# Patient Record
Sex: Female | Born: 1950 | Race: Black or African American | Hispanic: No | Smoking: Former smoker
Health system: Southern US, Community
[De-identification: ages and names within clinical notes are randomized; demographics above are authoritative.]

## PROBLEM LIST (undated history)

## (undated) DIAGNOSIS — E785 Hyperlipidemia, unspecified: Secondary | ICD-10-CM

## (undated) DIAGNOSIS — D649 Anemia, unspecified: Secondary | ICD-10-CM

## (undated) DIAGNOSIS — Z9289 Personal history of other medical treatment: Secondary | ICD-10-CM

## (undated) DIAGNOSIS — I10 Essential (primary) hypertension: Secondary | ICD-10-CM

## (undated) DIAGNOSIS — R319 Hematuria, unspecified: Secondary | ICD-10-CM

## (undated) DIAGNOSIS — N739 Female pelvic inflammatory disease, unspecified: Secondary | ICD-10-CM

## (undated) DIAGNOSIS — J4489 Other specified chronic obstructive pulmonary disease: Secondary | ICD-10-CM

## (undated) DIAGNOSIS — N736 Female pelvic peritoneal adhesions (postinfective): Secondary | ICD-10-CM

## (undated) DIAGNOSIS — M810 Age-related osteoporosis without current pathological fracture: Secondary | ICD-10-CM

## (undated) DIAGNOSIS — K219 Gastro-esophageal reflux disease without esophagitis: Secondary | ICD-10-CM

## (undated) DIAGNOSIS — R0602 Shortness of breath: Secondary | ICD-10-CM

## (undated) DIAGNOSIS — J449 Chronic obstructive pulmonary disease, unspecified: Secondary | ICD-10-CM

## (undated) DIAGNOSIS — J189 Pneumonia, unspecified organism: Secondary | ICD-10-CM

## (undated) DIAGNOSIS — K5090 Crohn's disease, unspecified, without complications: Secondary | ICD-10-CM

## (undated) DIAGNOSIS — F419 Anxiety disorder, unspecified: Secondary | ICD-10-CM

## (undated) DIAGNOSIS — M069 Rheumatoid arthritis, unspecified: Secondary | ICD-10-CM

## (undated) DIAGNOSIS — F329 Major depressive disorder, single episode, unspecified: Secondary | ICD-10-CM

## (undated) DIAGNOSIS — K565 Intestinal adhesions [bands], unspecified as to partial versus complete obstruction: Secondary | ICD-10-CM

## (undated) DIAGNOSIS — J439 Emphysema, unspecified: Secondary | ICD-10-CM

## (undated) DIAGNOSIS — D069 Carcinoma in situ of cervix, unspecified: Secondary | ICD-10-CM

## (undated) DIAGNOSIS — E119 Type 2 diabetes mellitus without complications: Secondary | ICD-10-CM

## (undated) DIAGNOSIS — Z9981 Dependence on supplemental oxygen: Secondary | ICD-10-CM

## (undated) DIAGNOSIS — J383 Other diseases of vocal cords: Secondary | ICD-10-CM

## (undated) DIAGNOSIS — F32A Depression, unspecified: Secondary | ICD-10-CM

## (undated) HISTORY — DX: Major depressive disorder, single episode, unspecified: F32.9

## (undated) HISTORY — DX: Crohn's disease, unspecified, without complications: K50.90

## (undated) HISTORY — DX: Carcinoma in situ of cervix, unspecified: D06.9

## (undated) HISTORY — DX: Female pelvic inflammatory disease, unspecified: N73.9

## (undated) HISTORY — DX: Gastro-esophageal reflux disease without esophagitis: K21.9

## (undated) HISTORY — PX: OOPHORECTOMY: SHX86

## (undated) HISTORY — DX: Anxiety disorder, unspecified: F41.9

## (undated) HISTORY — DX: Other specified chronic obstructive pulmonary disease: J44.89

## (undated) HISTORY — DX: Age-related osteoporosis without current pathological fracture: M81.0

## (undated) HISTORY — DX: Depression, unspecified: F32.A

## (undated) HISTORY — PX: ESOPHAGOGASTRODUODENOSCOPY (EGD) WITH ESOPHAGEAL DILATION: SHX5812

## (undated) HISTORY — PX: COLPOSCOPY: SHX161

## (undated) HISTORY — DX: Rheumatoid arthritis, unspecified: M06.9

## (undated) HISTORY — DX: Chronic obstructive pulmonary disease, unspecified: J44.9

## (undated) HISTORY — DX: Female pelvic peritoneal adhesions (postinfective): N73.6

## (undated) HISTORY — PX: HERNIA REPAIR: SHX51

## (undated) HISTORY — DX: Other diseases of vocal cords: J38.3

## (undated) HISTORY — DX: Hematuria, unspecified: R31.9

## (undated) HISTORY — DX: Hyperlipidemia, unspecified: E78.5

## (undated) HISTORY — DX: Intestinal adhesions (bands), unspecified as to partial versus complete obstruction: K56.50

---

## 1969-01-24 HISTORY — PX: EAR CYST EXCISION: SHX22

## 1976-05-26 HISTORY — PX: CERVICAL CONE BIOPSY: SUR198

## 1979-01-25 HISTORY — PX: HAMMER TOE SURGERY: SHX385

## 1991-05-27 HISTORY — PX: VAGINAL HYSTERECTOMY: SUR661

## 1997-11-14 ENCOUNTER — Other Ambulatory Visit: Admission: RE | Admit: 1997-11-14 | Discharge: 1997-11-14 | Payer: Self-pay | Admitting: Obstetrics and Gynecology

## 1998-10-22 ENCOUNTER — Encounter: Payer: Self-pay | Admitting: Emergency Medicine

## 1998-10-22 ENCOUNTER — Inpatient Hospital Stay (HOSPITAL_COMMUNITY): Admission: EM | Admit: 1998-10-22 | Discharge: 1998-10-24 | Payer: Self-pay | Admitting: Emergency Medicine

## 1998-12-05 ENCOUNTER — Other Ambulatory Visit: Admission: RE | Admit: 1998-12-05 | Discharge: 1998-12-05 | Payer: Self-pay | Admitting: Obstetrics and Gynecology

## 1999-01-13 ENCOUNTER — Encounter: Payer: Self-pay | Admitting: Emergency Medicine

## 1999-01-13 ENCOUNTER — Emergency Department (HOSPITAL_COMMUNITY): Admission: EM | Admit: 1999-01-13 | Discharge: 1999-01-13 | Payer: Self-pay | Admitting: Emergency Medicine

## 1999-08-26 ENCOUNTER — Inpatient Hospital Stay (HOSPITAL_COMMUNITY): Admission: EM | Admit: 1999-08-26 | Discharge: 1999-09-05 | Payer: Self-pay | Admitting: Emergency Medicine

## 1999-08-26 ENCOUNTER — Encounter: Payer: Self-pay | Admitting: Emergency Medicine

## 1999-08-29 ENCOUNTER — Encounter: Payer: Self-pay | Admitting: Internal Medicine

## 1999-12-09 ENCOUNTER — Other Ambulatory Visit: Admission: RE | Admit: 1999-12-09 | Discharge: 1999-12-09 | Payer: Self-pay | Admitting: Obstetrics and Gynecology

## 1999-12-17 ENCOUNTER — Encounter (HOSPITAL_COMMUNITY): Admission: RE | Admit: 1999-12-17 | Discharge: 1999-12-30 | Payer: Self-pay | Admitting: Critical Care Medicine

## 1999-12-31 ENCOUNTER — Encounter (HOSPITAL_COMMUNITY): Admission: RE | Admit: 1999-12-31 | Discharge: 2000-03-30 | Payer: Self-pay | Admitting: Critical Care Medicine

## 2000-03-22 ENCOUNTER — Encounter: Payer: Self-pay | Admitting: Emergency Medicine

## 2000-03-22 ENCOUNTER — Inpatient Hospital Stay (HOSPITAL_COMMUNITY): Admission: EM | Admit: 2000-03-22 | Discharge: 2000-03-25 | Payer: Self-pay | Admitting: Emergency Medicine

## 2000-12-09 ENCOUNTER — Other Ambulatory Visit: Admission: RE | Admit: 2000-12-09 | Discharge: 2000-12-09 | Payer: Self-pay | Admitting: Obstetrics and Gynecology

## 2001-07-21 ENCOUNTER — Ambulatory Visit (HOSPITAL_COMMUNITY): Admission: RE | Admit: 2001-07-21 | Discharge: 2001-07-21 | Payer: Self-pay | Admitting: Gastroenterology

## 2001-07-21 ENCOUNTER — Encounter (INDEPENDENT_AMBULATORY_CARE_PROVIDER_SITE_OTHER): Payer: Self-pay | Admitting: Specialist

## 2001-12-09 ENCOUNTER — Other Ambulatory Visit: Admission: RE | Admit: 2001-12-09 | Discharge: 2001-12-09 | Payer: Self-pay | Admitting: Obstetrics and Gynecology

## 2002-12-28 ENCOUNTER — Other Ambulatory Visit: Admission: RE | Admit: 2002-12-28 | Discharge: 2002-12-28 | Payer: Self-pay | Admitting: Obstetrics and Gynecology

## 2003-02-21 ENCOUNTER — Emergency Department (HOSPITAL_COMMUNITY): Admission: EM | Admit: 2003-02-21 | Discharge: 2003-02-21 | Payer: Self-pay | Admitting: Emergency Medicine

## 2003-02-21 ENCOUNTER — Encounter: Payer: Self-pay | Admitting: Emergency Medicine

## 2003-07-20 ENCOUNTER — Inpatient Hospital Stay (HOSPITAL_COMMUNITY): Admission: EM | Admit: 2003-07-20 | Discharge: 2003-08-11 | Payer: Self-pay | Admitting: Emergency Medicine

## 2003-08-11 ENCOUNTER — Inpatient Hospital Stay (HOSPITAL_COMMUNITY)
Admission: RE | Admit: 2003-08-11 | Discharge: 2003-09-07 | Payer: Self-pay | Admitting: Physical Medicine & Rehabilitation

## 2003-10-25 ENCOUNTER — Encounter
Admission: RE | Admit: 2003-10-25 | Discharge: 2004-01-23 | Payer: Self-pay | Admitting: Physical Medicine & Rehabilitation

## 2003-12-10 ENCOUNTER — Emergency Department (HOSPITAL_COMMUNITY): Admission: EM | Admit: 2003-12-10 | Discharge: 2003-12-11 | Payer: Self-pay | Admitting: Emergency Medicine

## 2003-12-12 ENCOUNTER — Inpatient Hospital Stay (HOSPITAL_COMMUNITY): Admission: EM | Admit: 2003-12-12 | Discharge: 2003-12-25 | Payer: Self-pay | Admitting: Emergency Medicine

## 2003-12-15 ENCOUNTER — Encounter (INDEPENDENT_AMBULATORY_CARE_PROVIDER_SITE_OTHER): Payer: Self-pay | Admitting: *Deleted

## 2004-01-09 ENCOUNTER — Other Ambulatory Visit: Admission: RE | Admit: 2004-01-09 | Discharge: 2004-01-09 | Payer: Self-pay | Admitting: Obstetrics and Gynecology

## 2004-03-27 ENCOUNTER — Inpatient Hospital Stay (HOSPITAL_COMMUNITY): Admission: AD | Admit: 2004-03-27 | Discharge: 2004-04-01 | Payer: Self-pay | Admitting: Critical Care Medicine

## 2004-03-27 ENCOUNTER — Ambulatory Visit: Payer: Self-pay | Admitting: Critical Care Medicine

## 2004-04-11 ENCOUNTER — Ambulatory Visit: Payer: Self-pay | Admitting: Critical Care Medicine

## 2004-04-22 ENCOUNTER — Ambulatory Visit (HOSPITAL_COMMUNITY): Admission: RE | Admit: 2004-04-22 | Discharge: 2004-04-22 | Payer: Self-pay | Admitting: Critical Care Medicine

## 2004-04-25 ENCOUNTER — Ambulatory Visit: Payer: Self-pay | Admitting: Adult Health

## 2004-04-30 ENCOUNTER — Ambulatory Visit: Payer: Self-pay | Admitting: Critical Care Medicine

## 2004-05-02 ENCOUNTER — Ambulatory Visit: Payer: Self-pay | Admitting: Critical Care Medicine

## 2004-07-11 ENCOUNTER — Ambulatory Visit: Payer: Self-pay | Admitting: Critical Care Medicine

## 2004-07-31 ENCOUNTER — Ambulatory Visit: Payer: Self-pay | Admitting: Critical Care Medicine

## 2004-08-19 ENCOUNTER — Ambulatory Visit: Payer: Self-pay | Admitting: Critical Care Medicine

## 2004-09-16 ENCOUNTER — Ambulatory Visit: Payer: Self-pay | Admitting: Critical Care Medicine

## 2004-11-11 ENCOUNTER — Ambulatory Visit: Payer: Self-pay | Admitting: Critical Care Medicine

## 2004-11-19 ENCOUNTER — Ambulatory Visit: Payer: Self-pay | Admitting: Internal Medicine

## 2004-12-03 ENCOUNTER — Ambulatory Visit: Payer: Self-pay | Admitting: Critical Care Medicine

## 2004-12-12 ENCOUNTER — Ambulatory Visit: Payer: Self-pay | Admitting: Critical Care Medicine

## 2005-01-06 ENCOUNTER — Ambulatory Visit: Payer: Self-pay | Admitting: Critical Care Medicine

## 2005-01-20 ENCOUNTER — Ambulatory Visit: Payer: Self-pay | Admitting: Internal Medicine

## 2005-01-21 ENCOUNTER — Other Ambulatory Visit: Admission: RE | Admit: 2005-01-21 | Discharge: 2005-01-21 | Payer: Self-pay | Admitting: Obstetrics and Gynecology

## 2005-03-03 ENCOUNTER — Ambulatory Visit: Payer: Self-pay | Admitting: Critical Care Medicine

## 2005-03-04 ENCOUNTER — Encounter (INDEPENDENT_AMBULATORY_CARE_PROVIDER_SITE_OTHER): Payer: Self-pay | Admitting: *Deleted

## 2005-03-04 ENCOUNTER — Ambulatory Visit (HOSPITAL_COMMUNITY): Admission: RE | Admit: 2005-03-04 | Discharge: 2005-03-04 | Payer: Self-pay | Admitting: Gastroenterology

## 2005-03-09 IMAGING — CR DG CHEST 1V PORT
1 series · 1 of 1 positions shown · non-contrast
Comparison: two view chest 02/21/03.

CLINICAL DATA: shortness of breath 
PORTABLE CHEST ONE VIEW 07/20/03 1122 hours:

[view not recorded]
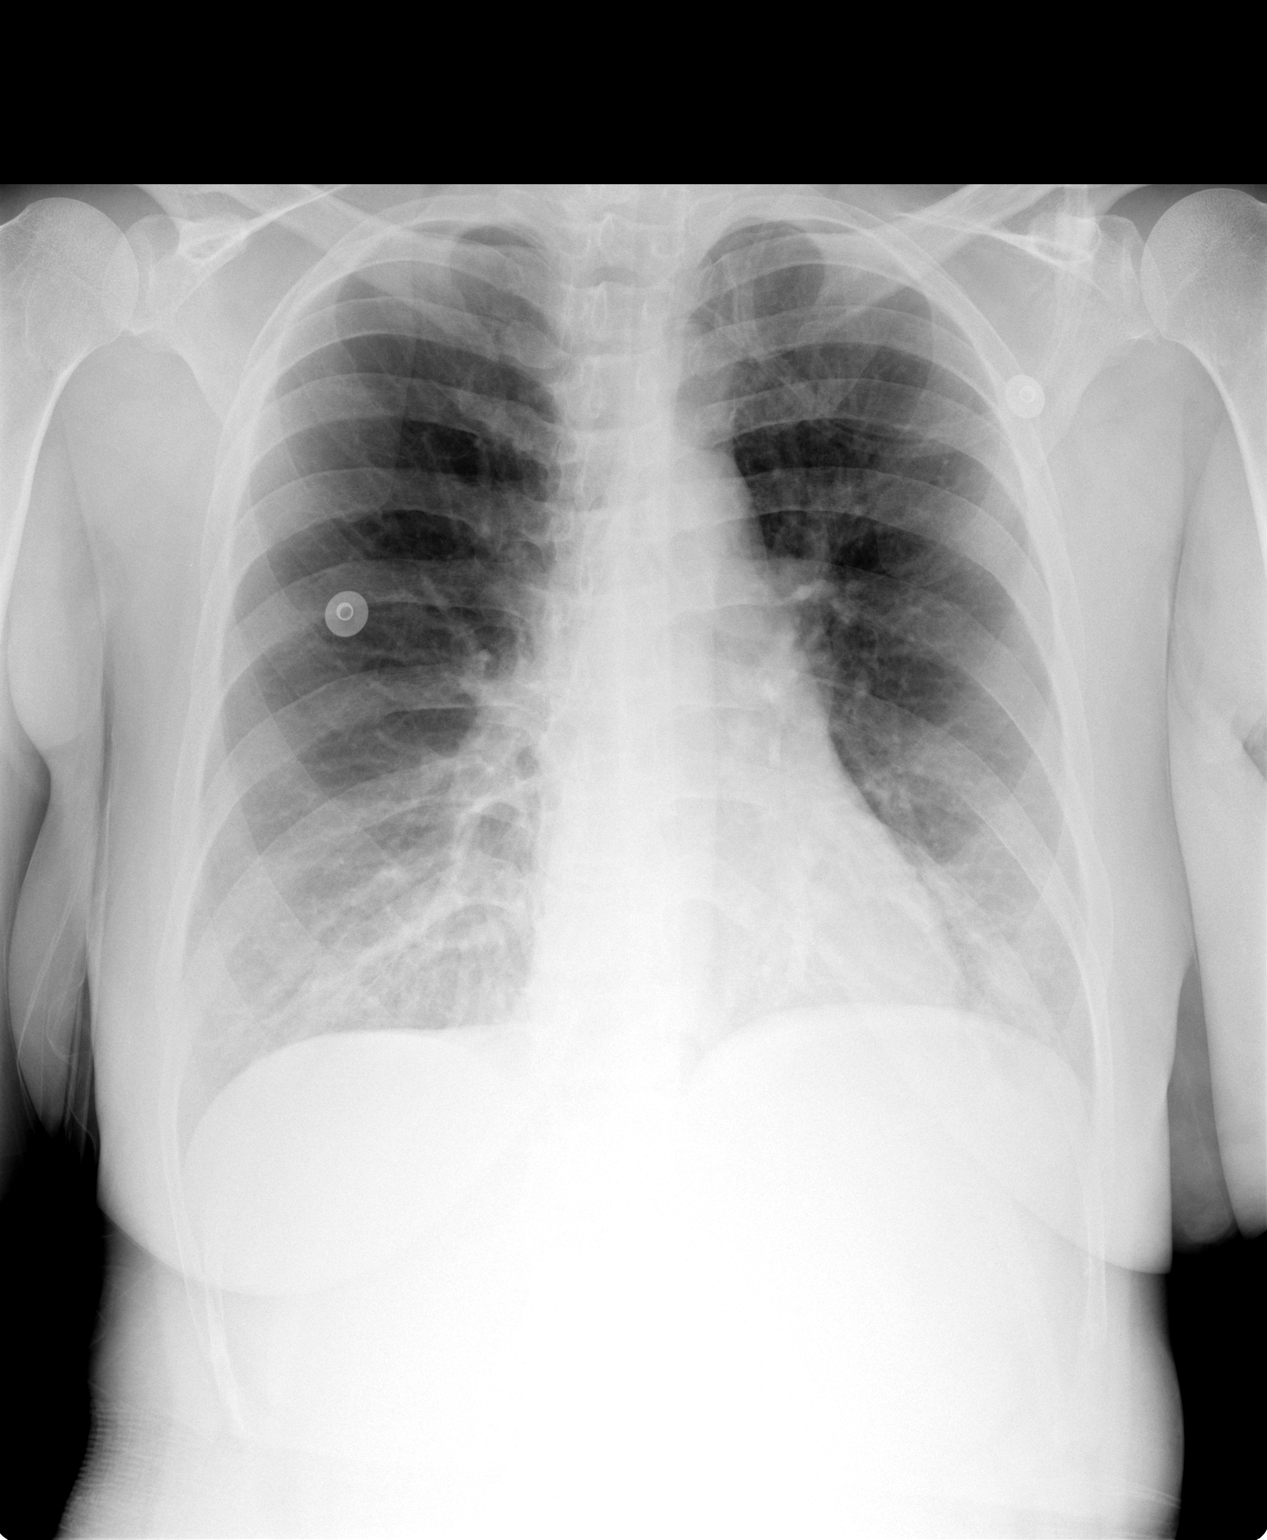

[1 of 1 positions shown; findings below may reference images not displayed]

The cardiomediastinal silhouette is unremarkable and is stable.  Bronchovascular markings are prominent, more so than on the previous examination.  There are no confluent areas of consolidation.  There are no pleural effusions.  
IMPRESSION
Mild changes of acute asthma/bronchitis.

## 2005-03-10 IMAGING — CR DG CHEST 1V PORT
1 series · 1 of 1 positions shown · non-contrast
Comparison: 07/20/03.

CLINICAL DATA: Acute respiratory failure.
 CHEST, PORTABLE ONE VIEW ? 07/21/03, 8128

[view not recorded]
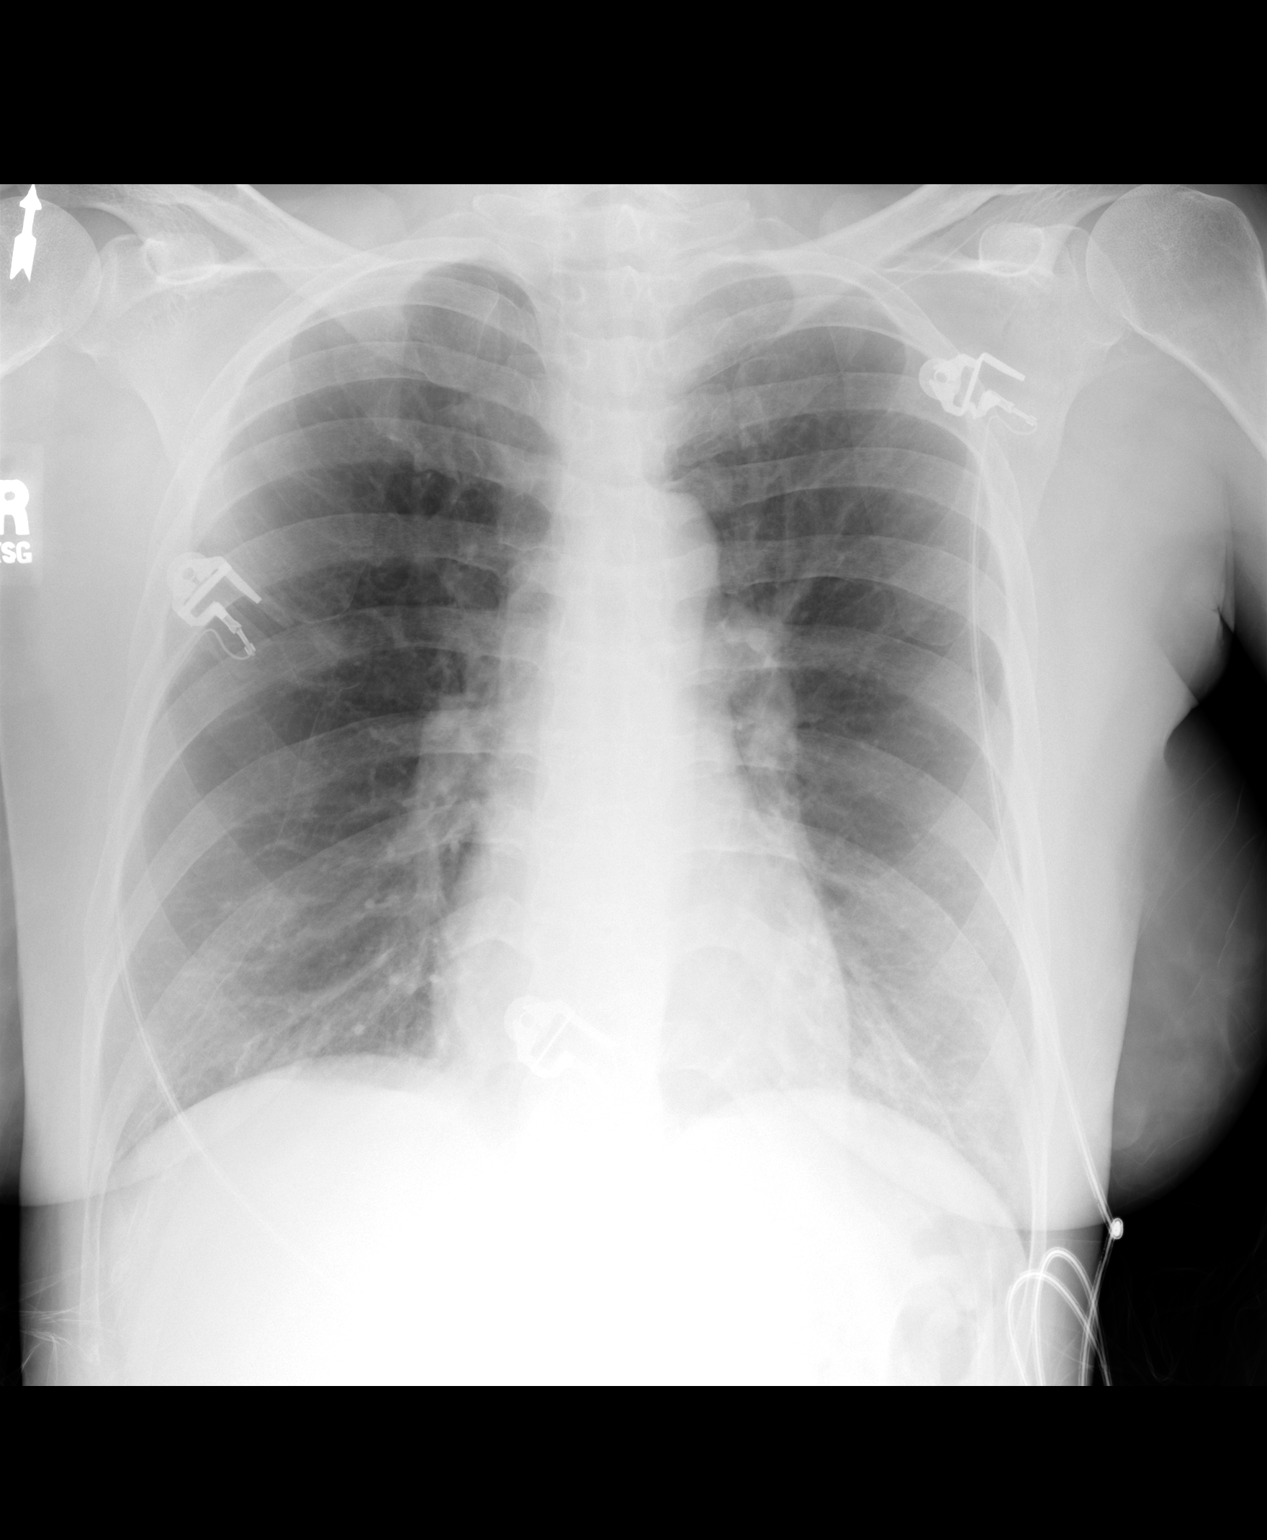

[1 of 1 positions shown; findings below may reference images not displayed]

There is hyperinflation of the lungs.  Mild bronchitic changes are noted with peribronchial thickening.  Improved aeration in the lung bases.
IMPRESSION: Decreasing bibasilar atelectasis.  Mild bronchitic changes.

## 2005-03-11 IMAGING — CR DG CHEST 1V PORT
1 series · 1 of 1 positions shown · non-contrast
Comparison: 07/21/03.

CLINICAL DATA: COPD.  Acute respiratory failure.
 PORTABLE CHEST - 07/22/03 AT 7441 HOURS

[view not recorded]
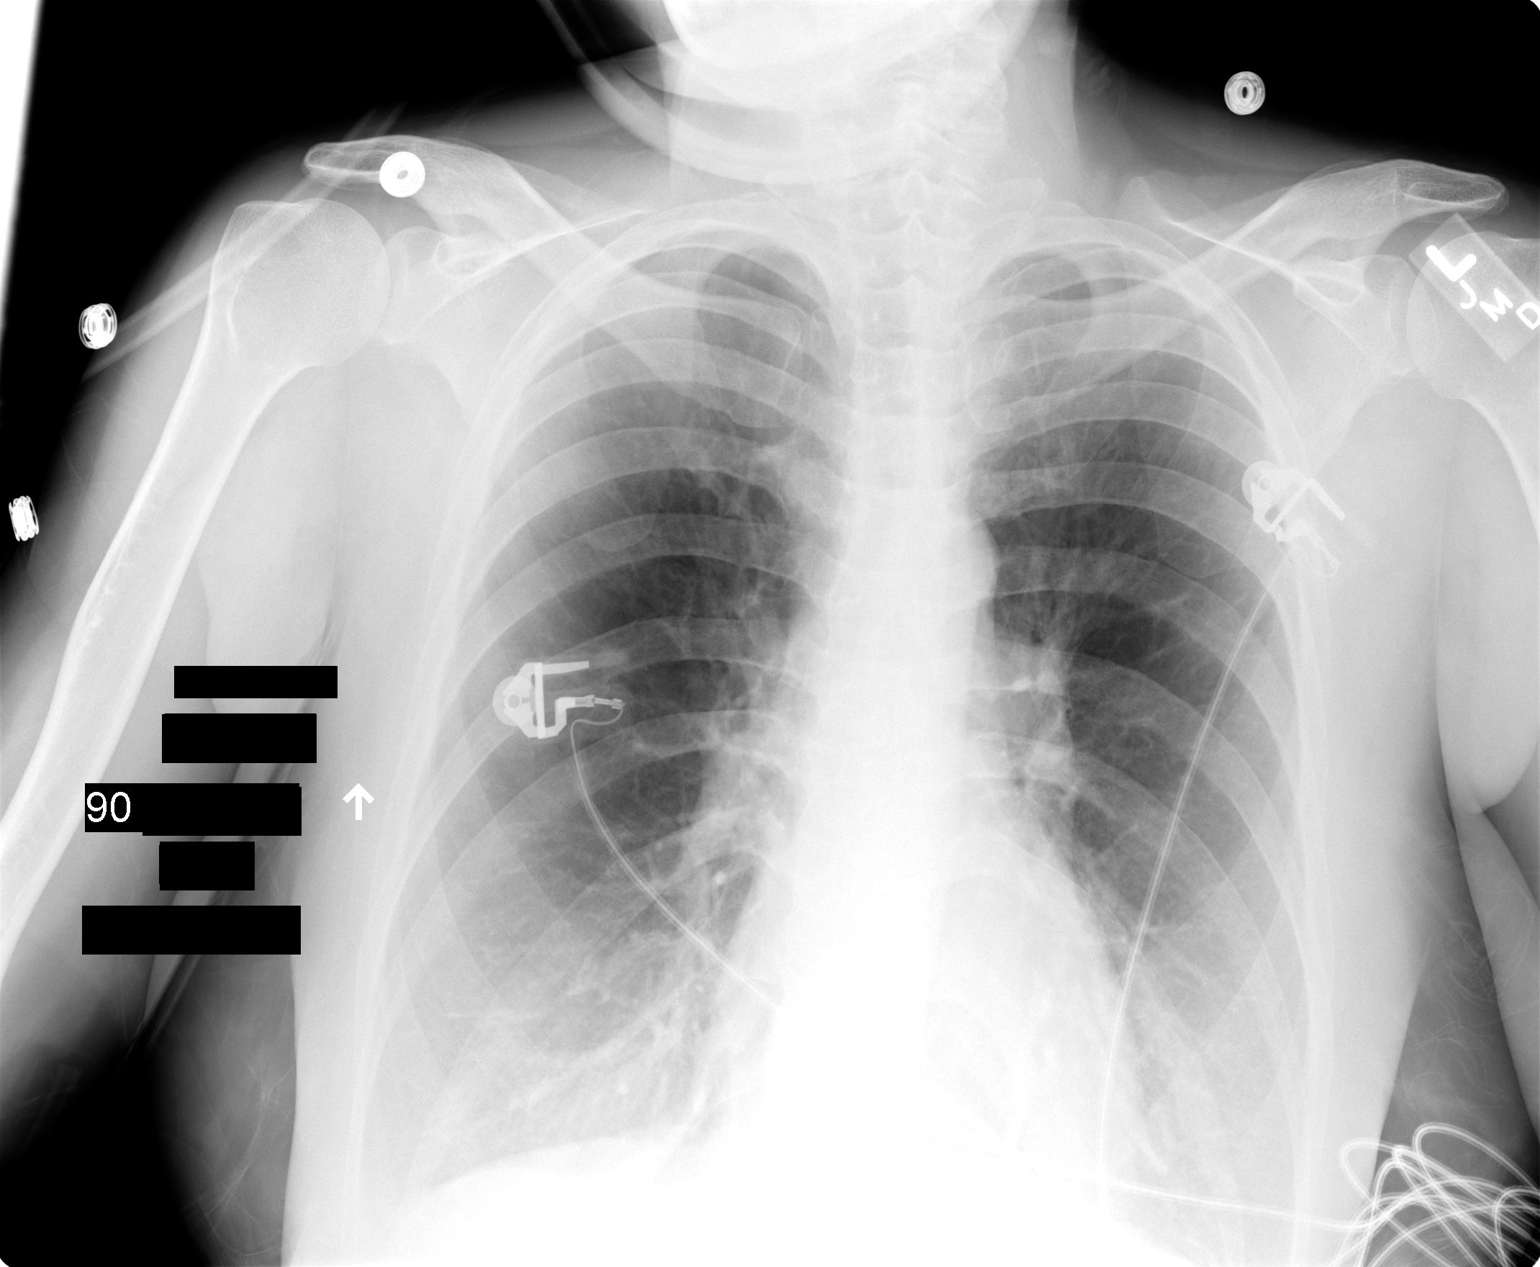

[1 of 1 positions shown; findings below may reference images not displayed]

Pulmonary hyperinflation is seen, consistent with COPD.  There is no evidence of pulmonary infiltrate or pleural effusion.  Heart size and mediastinal contours are normal and no significant change is seen since the prior study.
 IMPRESSION
 COPD.  No active disease.

## 2005-03-11 IMAGING — CR DG CHEST 1V PORT
1 series · 1 of 1 positions shown · non-contrast
Comparison: none

CLINICAL DATA: 52 year-old with acute respiratory failure; rule out pneumonia 
 PORTABLE CHEST ONE VIEW 7117 hours:
 Comparison to study of [DATE]p.m. on the same day.  Endotracheal tube has been repositioned with tip approximately 6cm above carina.  Lungs are hyperinflated but otherwise clear.  
 IMPRESSION
 Repositioning of endotracheal tube.

[view not recorded]
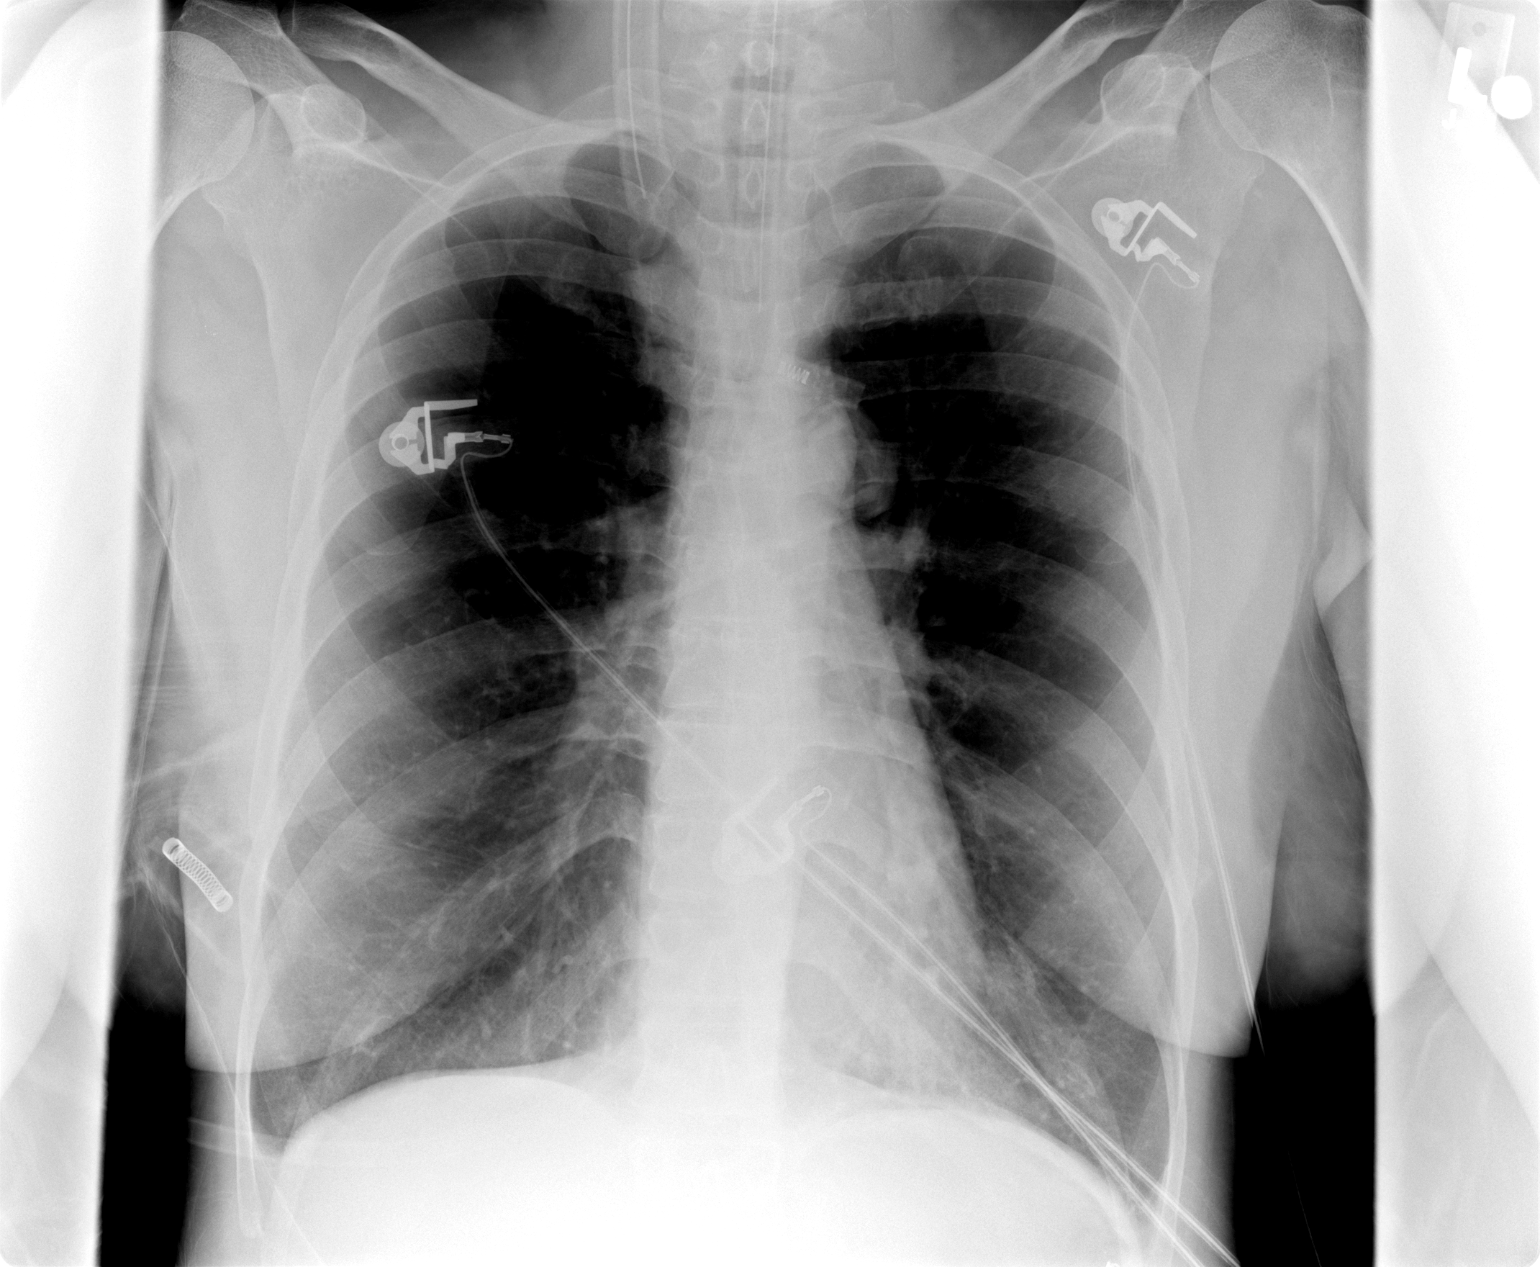

[1 of 1 positions shown; findings below may reference images not displayed]

## 2005-03-11 IMAGING — CR DG CHEST 1V PORT
1 series · 1 of 1 positions shown · non-contrast
Comparison: none

CLINICAL DATA: 52 year-old status post intubation; acute respiratory failure 
 PORTABLE CHEST ONE VIEW 07/22/03 5354 hours:
 Comparison to study [DATE] on the same day.  Patient has been intubated.  Endotracheal tube tip is approximately 4cm above carina.  Cardiac size is within normal limits.  Lungs are hyperinflated but clear.  
 IMPRESSION
 Status post intubation.

[view not recorded]
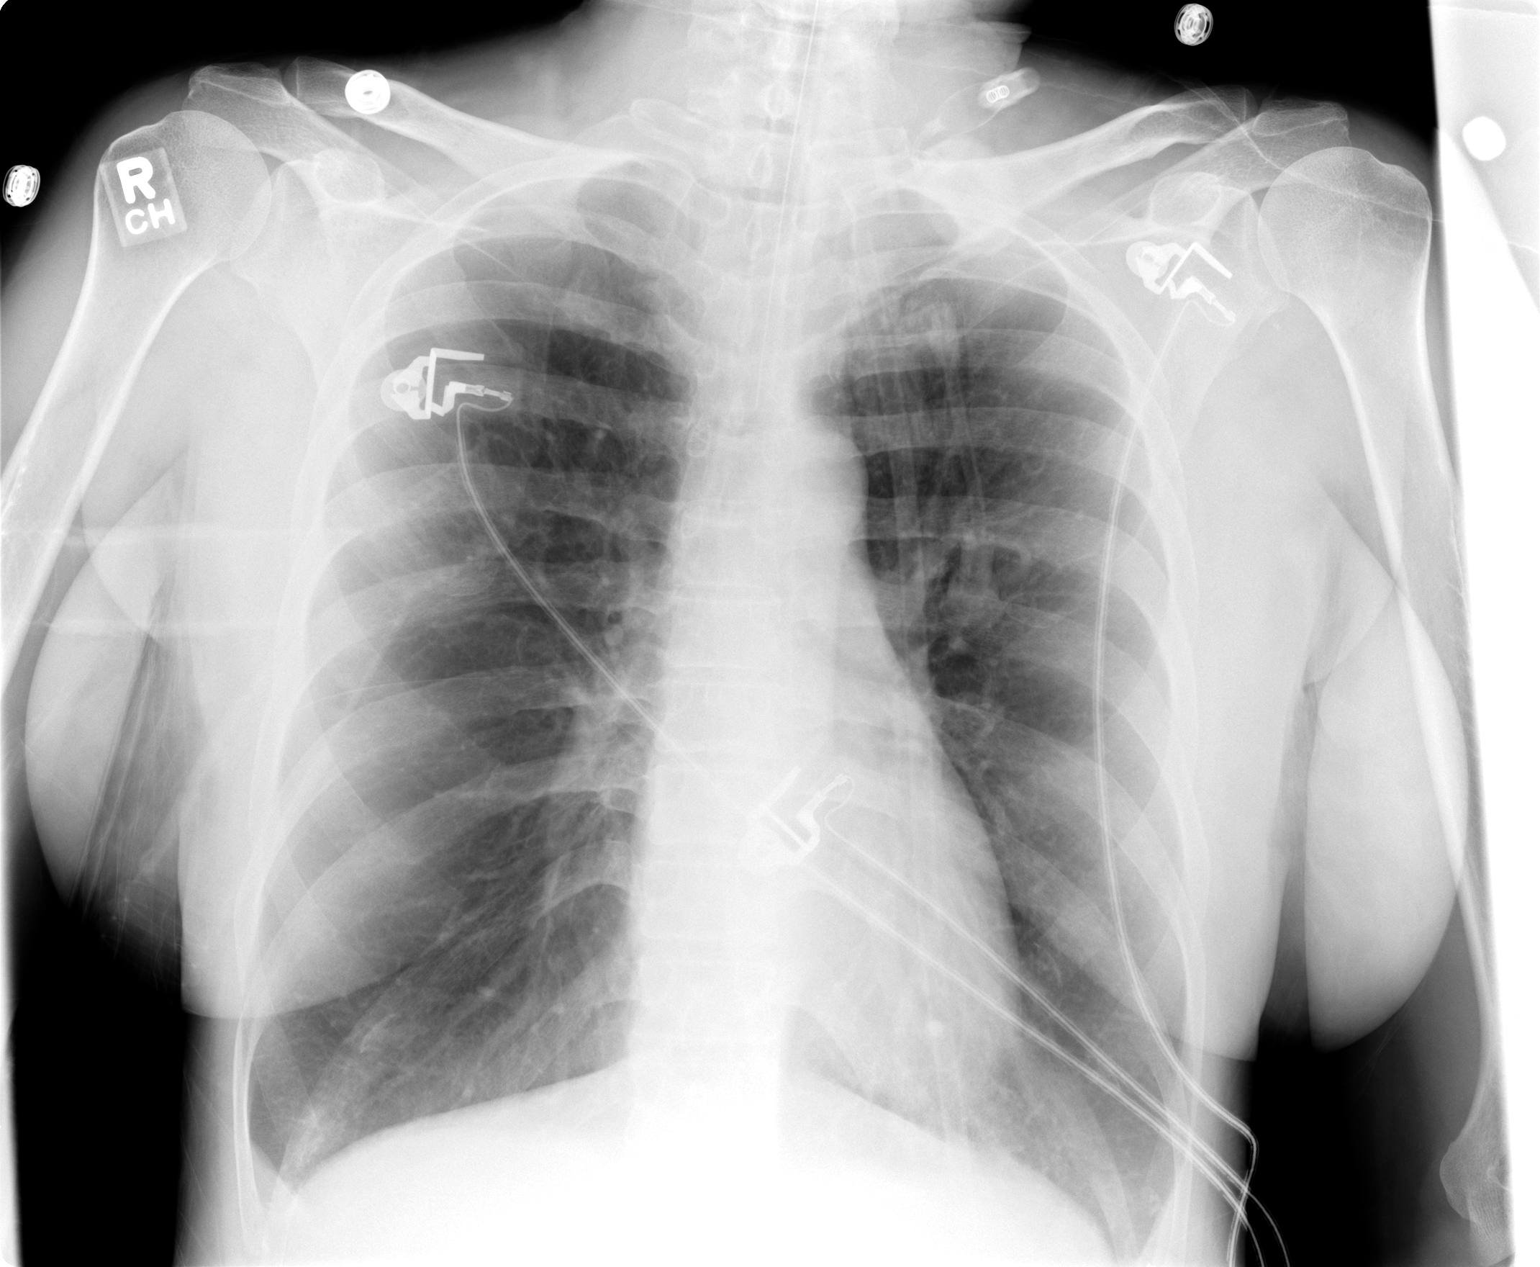

[1 of 1 positions shown; findings below may reference images not displayed]

## 2005-03-12 IMAGING — CR DG ABDOMEN 1V
1 series · 1 of 1 positions shown · non-contrast
Comparison: none

CLINICAL DATA: Acute respiratory failure.  Feeding tube placement.
 SINGLE VIEW ABDOMEN 
 A Panda feeding tube is seen with the tip in the mid stomach.  There is no evidence of dilated bowel loops.
 IMPRESSION
 Panda feeding tube tip in the mid stomach.

[view not recorded]
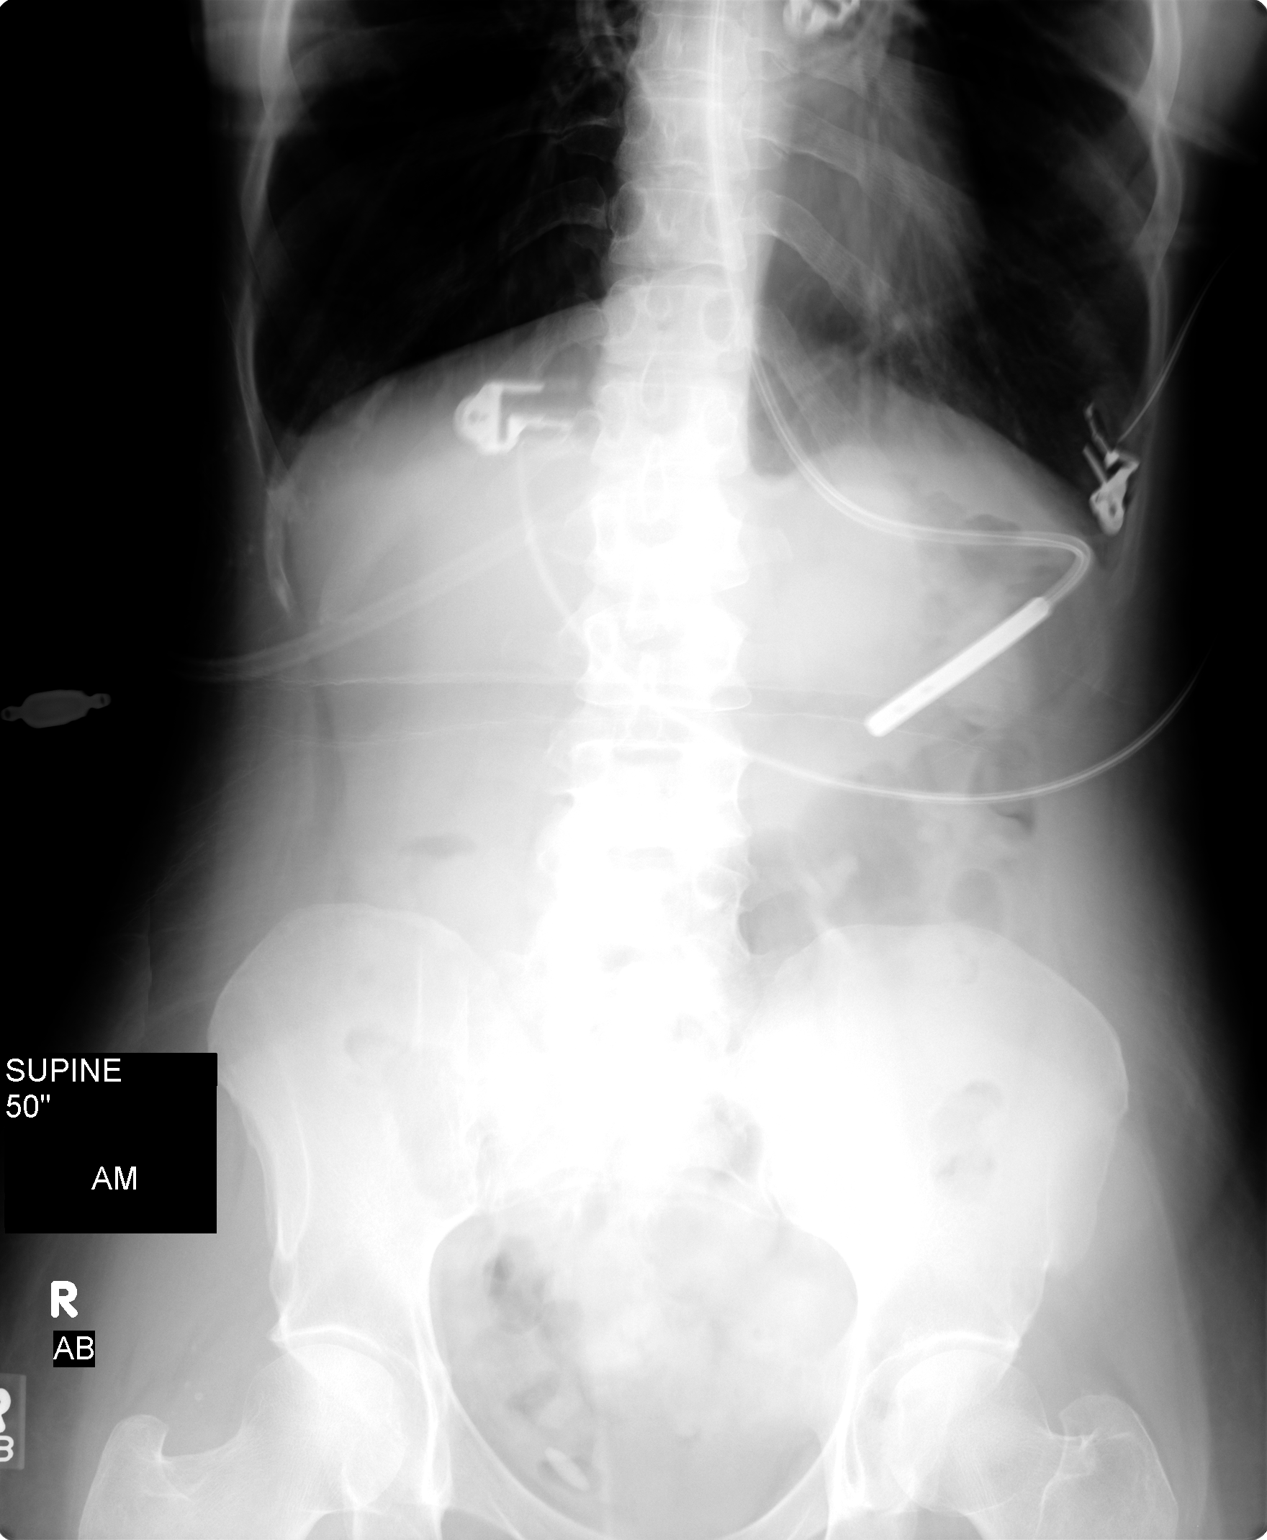

[1 of 1 positions shown; findings below may reference images not displayed]

## 2005-03-13 IMAGING — US IR US GUIDE VASC ACCESS RIGHT
1 series · 2 of 2 positions shown · non-contrast
Comparison: none

CLINICAL DATA: A 52-year-old female with acute respiratory failure.  Request to place PICC line.
 RIGHT UPPER EXTREMITY PICC PLACEMENT WITH ULTRASOUND AND FLUORO GUIDANCE
TECHNIQUE: The right arm was prepped with Betadine, draped in the usual sterile fashion, and infiltrated locally with 1% Lidocaine. Ultrasound demonstrated patency of the right basilic vein. Under real-time ultrasound guidance, this vein was accessed with a 21 gauge micropuncture needle. Ultrasound image documentation was performed. The needle was exchanged over a guidewire for a peel-away sheath through which a 5 French dual-lumen PICC catheter trimmed to 42 cm was advanced, positioned with its tip at the distal SVC/right atrial junction. Fluoroscopy during the procedure and fluoro spot radiograph confirms appropriate catheter position. The catheter was flushed, secured to the skin with Prolene sutures, and covered with a sterile dressing. No immediate complication. 
 Fluoro time:  0.1 minutes.
 IMPRESSION
 Technically successful right arm PICC placement with ultrasound and fluoroscopic guidance. Ready for routine use.

[Series 1: sp us guide vasc access*right* · 2 of 2 slices shown]
[im 1/2]
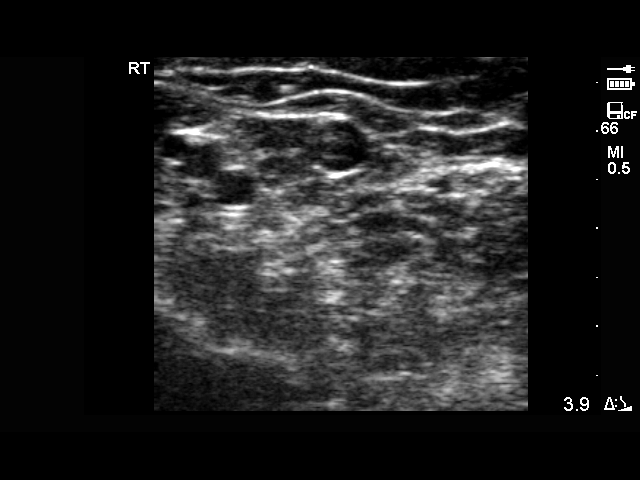
[im 2/2]
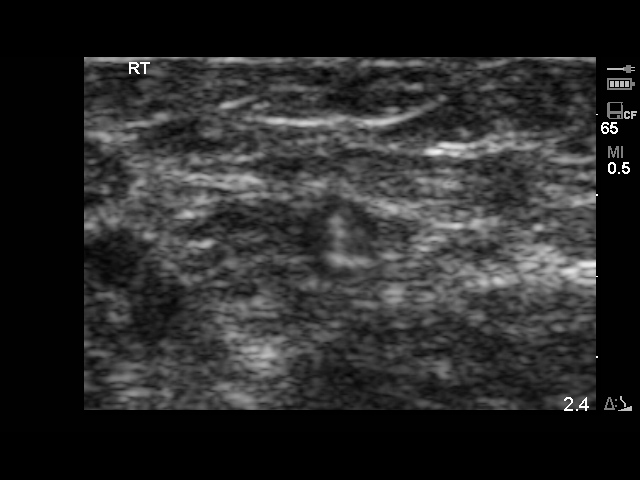

[2 of 2 positions shown; findings below may reference images not displayed]

## 2005-03-13 IMAGING — CR DG CHEST 1V PORT
1 series · 1 of 1 positions shown · non-contrast
Comparison: 07/22/03.

CLINICAL DATA: respiratory failure and COPD 
 PORTABLE SINGLE VIEW CHEST 07/24/03 [DATE] hours:

[view not recorded]
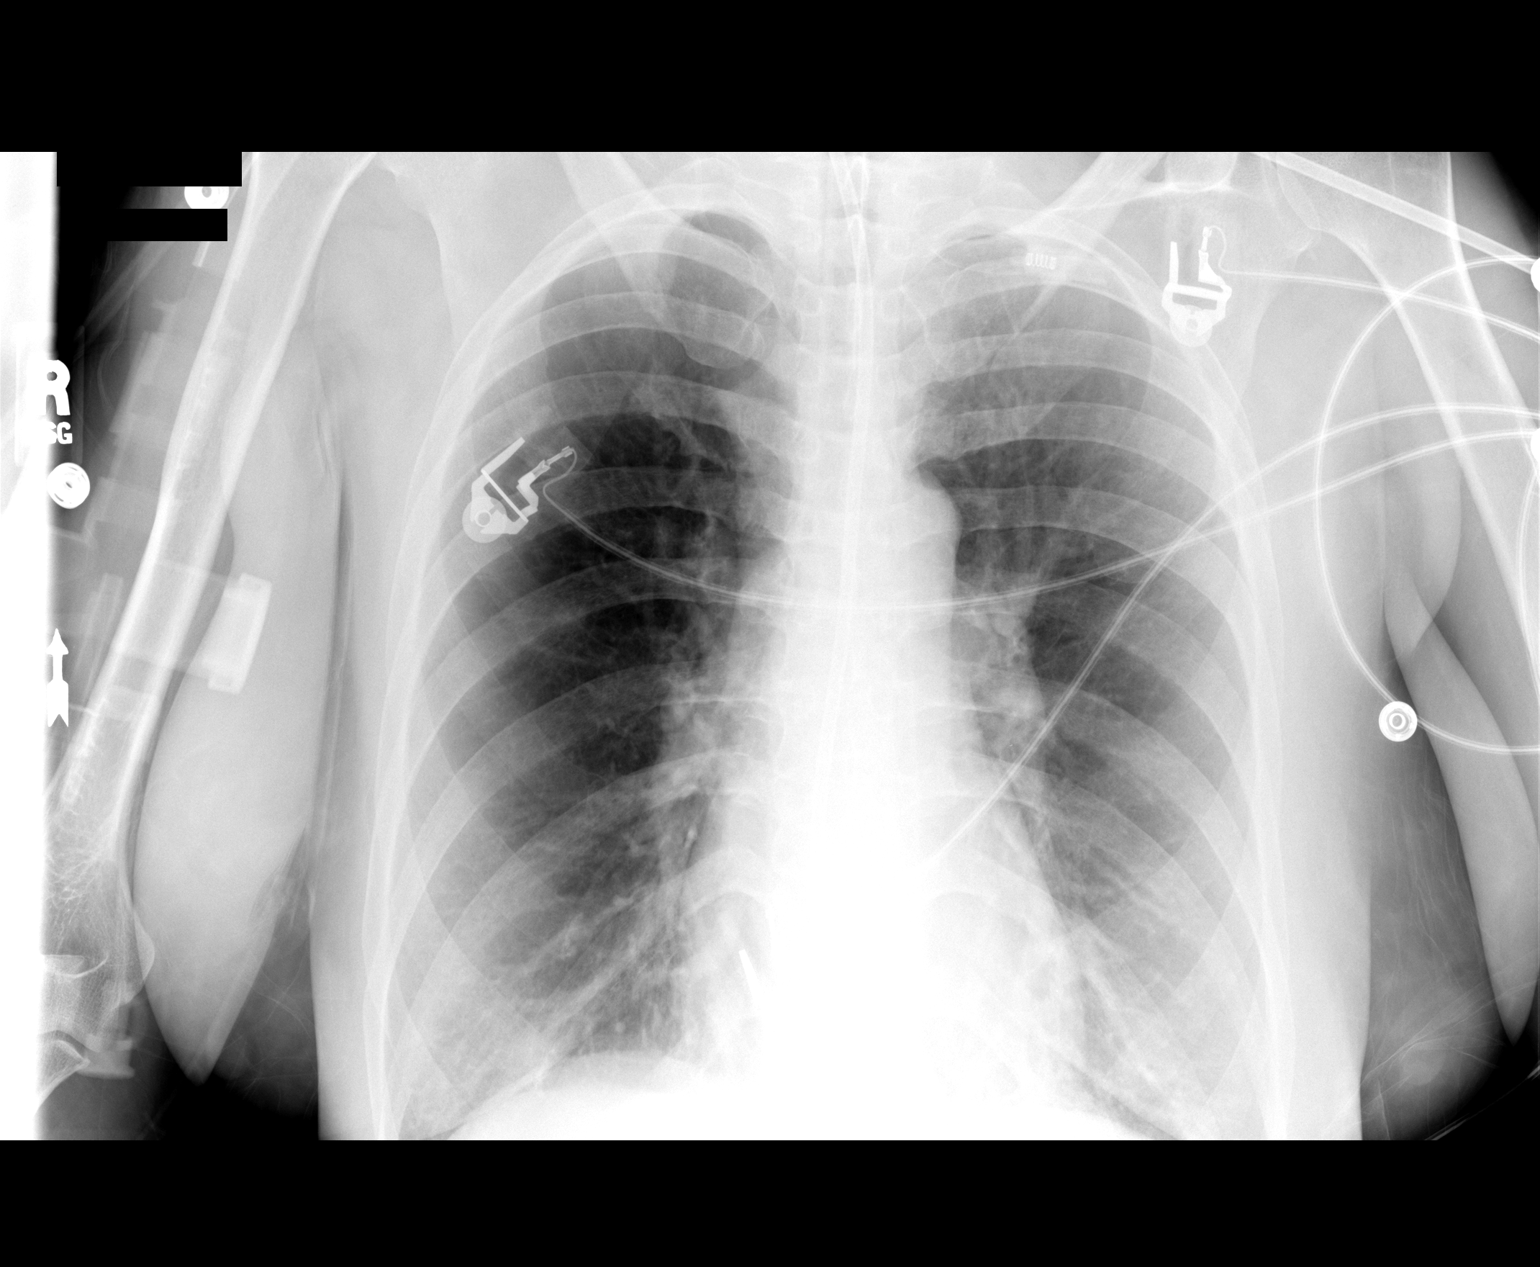

[1 of 1 positions shown; findings below may reference images not displayed]

There is more prominent atelectasis versus infiltrates at both lung bases.  No edema.  Stable positioning of endotracheal tube.
 IMPRESSION
 Increased prominence of bibasilar atelectasis versus infiltrate.

## 2005-03-13 IMAGING — XA IR CV CATH FLUORO GUIDE
1 series · 2 of 2 positions shown · non-contrast
Comparison: none

CLINICAL DATA: A 52-year-old female with acute respiratory failure.  Request to place PICC line.
 RIGHT UPPER EXTREMITY PICC PLACEMENT WITH ULTRASOUND AND FLUORO GUIDANCE
TECHNIQUE: The right arm was prepped with Betadine, draped in the usual sterile fashion, and infiltrated locally with 1% Lidocaine. Ultrasound demonstrated patency of the right basilic vein. Under real-time ultrasound guidance, this vein was accessed with a 21 gauge micropuncture needle. Ultrasound image documentation was performed. The needle was exchanged over a guidewire for a peel-away sheath through which a 5 French dual-lumen PICC catheter trimmed to 42 cm was advanced, positioned with its tip at the distal SVC/right atrial junction. Fluoroscopy during the procedure and fluoro spot radiograph confirms appropriate catheter position. The catheter was flushed, secured to the skin with Prolene sutures, and covered with a sterile dressing. No immediate complication. 
 Fluoro time:  0.1 minutes.
 IMPRESSION
 Technically successful right arm PICC placement with ultrasound and fluoroscopic guidance. Ready for routine use.

[Series 1000: run · 0.16mm/px · 2 of 2 slices shown]
[im 1/2]
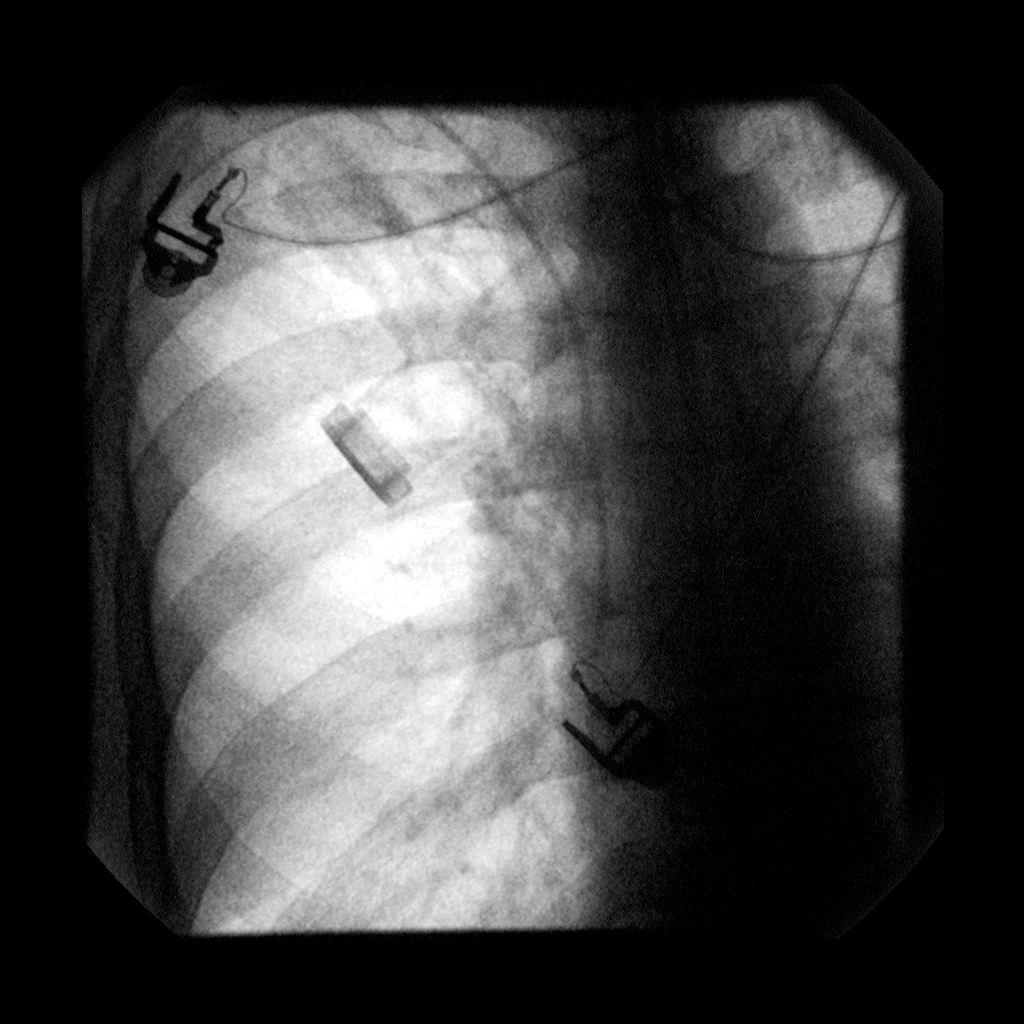
[im 2/2]
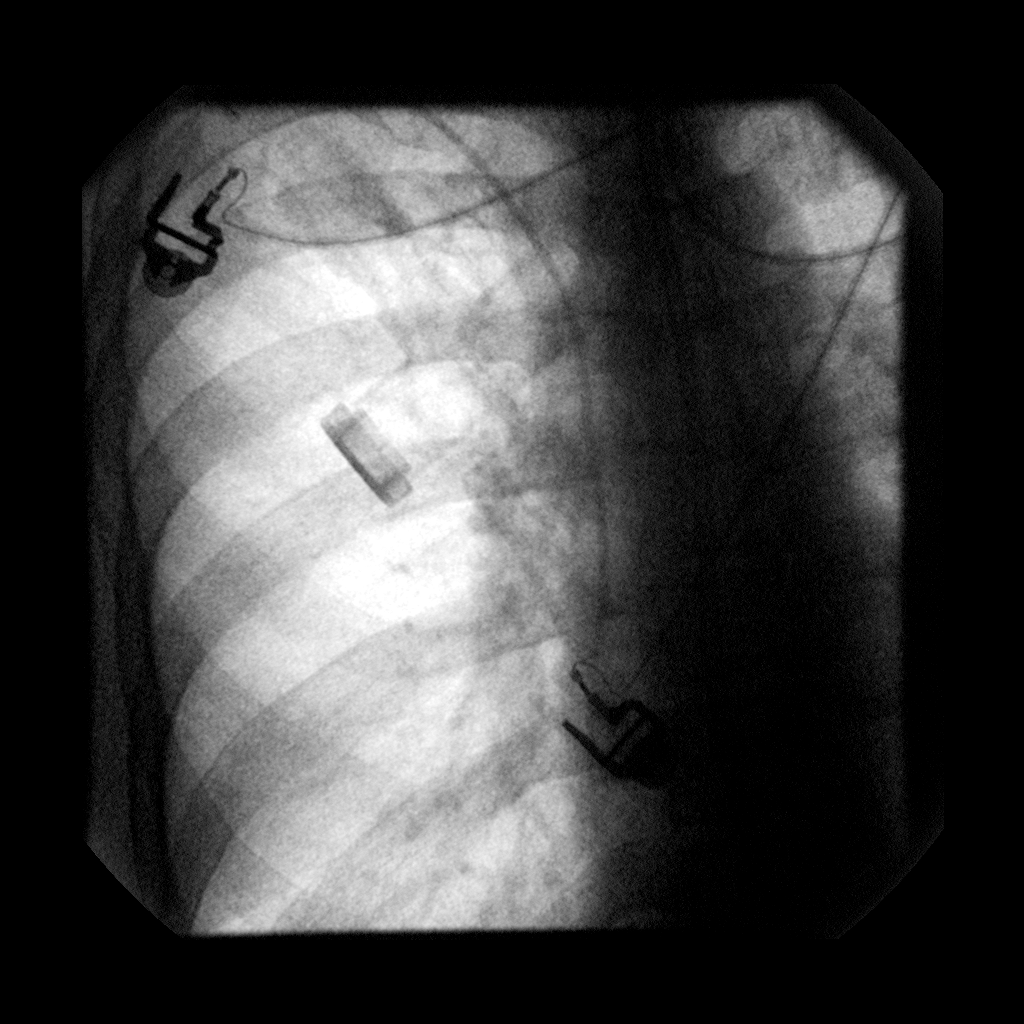

[2 of 2 positions shown; findings below may reference images not displayed]

## 2005-03-14 IMAGING — CR DG CHEST 1V PORT
1 series · 1 of 1 positions shown · non-contrast
Comparison: 07/24/03.

CLINICAL DATA: acute respiratory distress; asthma
 PORTABLE CHEST 07/25/03 [DATE] hours:

[view not recorded]
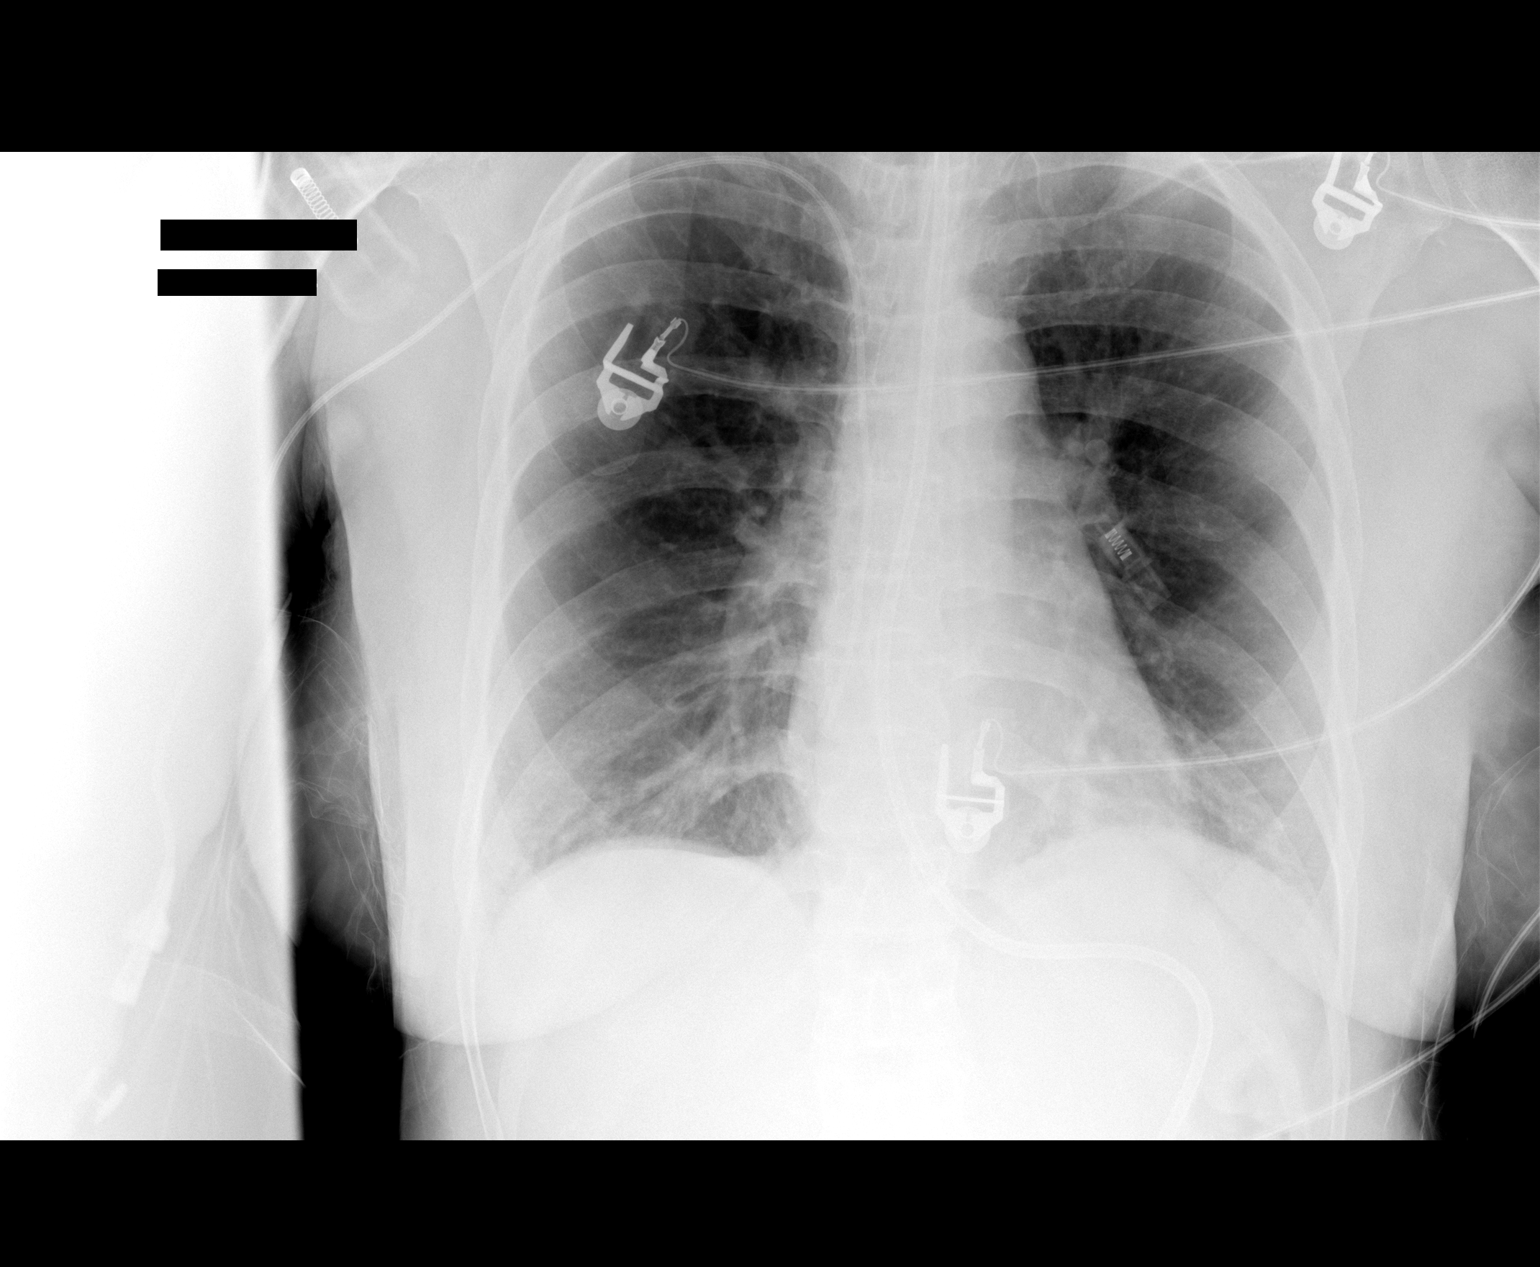

[1 of 1 positions shown; findings below may reference images not displayed]

New right-sided PICC extends in the midright atrium.  The endotracheal tube and feeding tube are in stable position.  Streaky left greater than right basilar opacities have not significantly changed and remain radiographically most compatible with atelectasis.  There is no pleural effusion or pneumothorax.  An ill-defined nodular density seen over the posterior aspect of the right seventh rib, just inferior to the patient?s EKG lead is not clearly seen on other studies done over the last week, but there has been an EKG lead in this area on all prior exams, and I would suggest PA and lateral radiographic followup with attention to this area once the patient is able. 
 IMPRESSION
 New right-sided PICC in the right atrium.
 Stable bibasilar opacities most compatible with atelectasis.  
 Possible nodule in the midright lung.  Attention to this area on followup radiographs is recommended.

## 2005-03-15 IMAGING — CR DG CHEST 1V PORT
1 series · 1 of 1 positions shown · non-contrast
Comparison: 07/25/03 at [DATE] a.m.

CLINICAL DATA: COPD.  Respiratory failure.
 AP PORTABLE CHEST 07/26/03 AT [DATE] A.M.

[view not recorded]
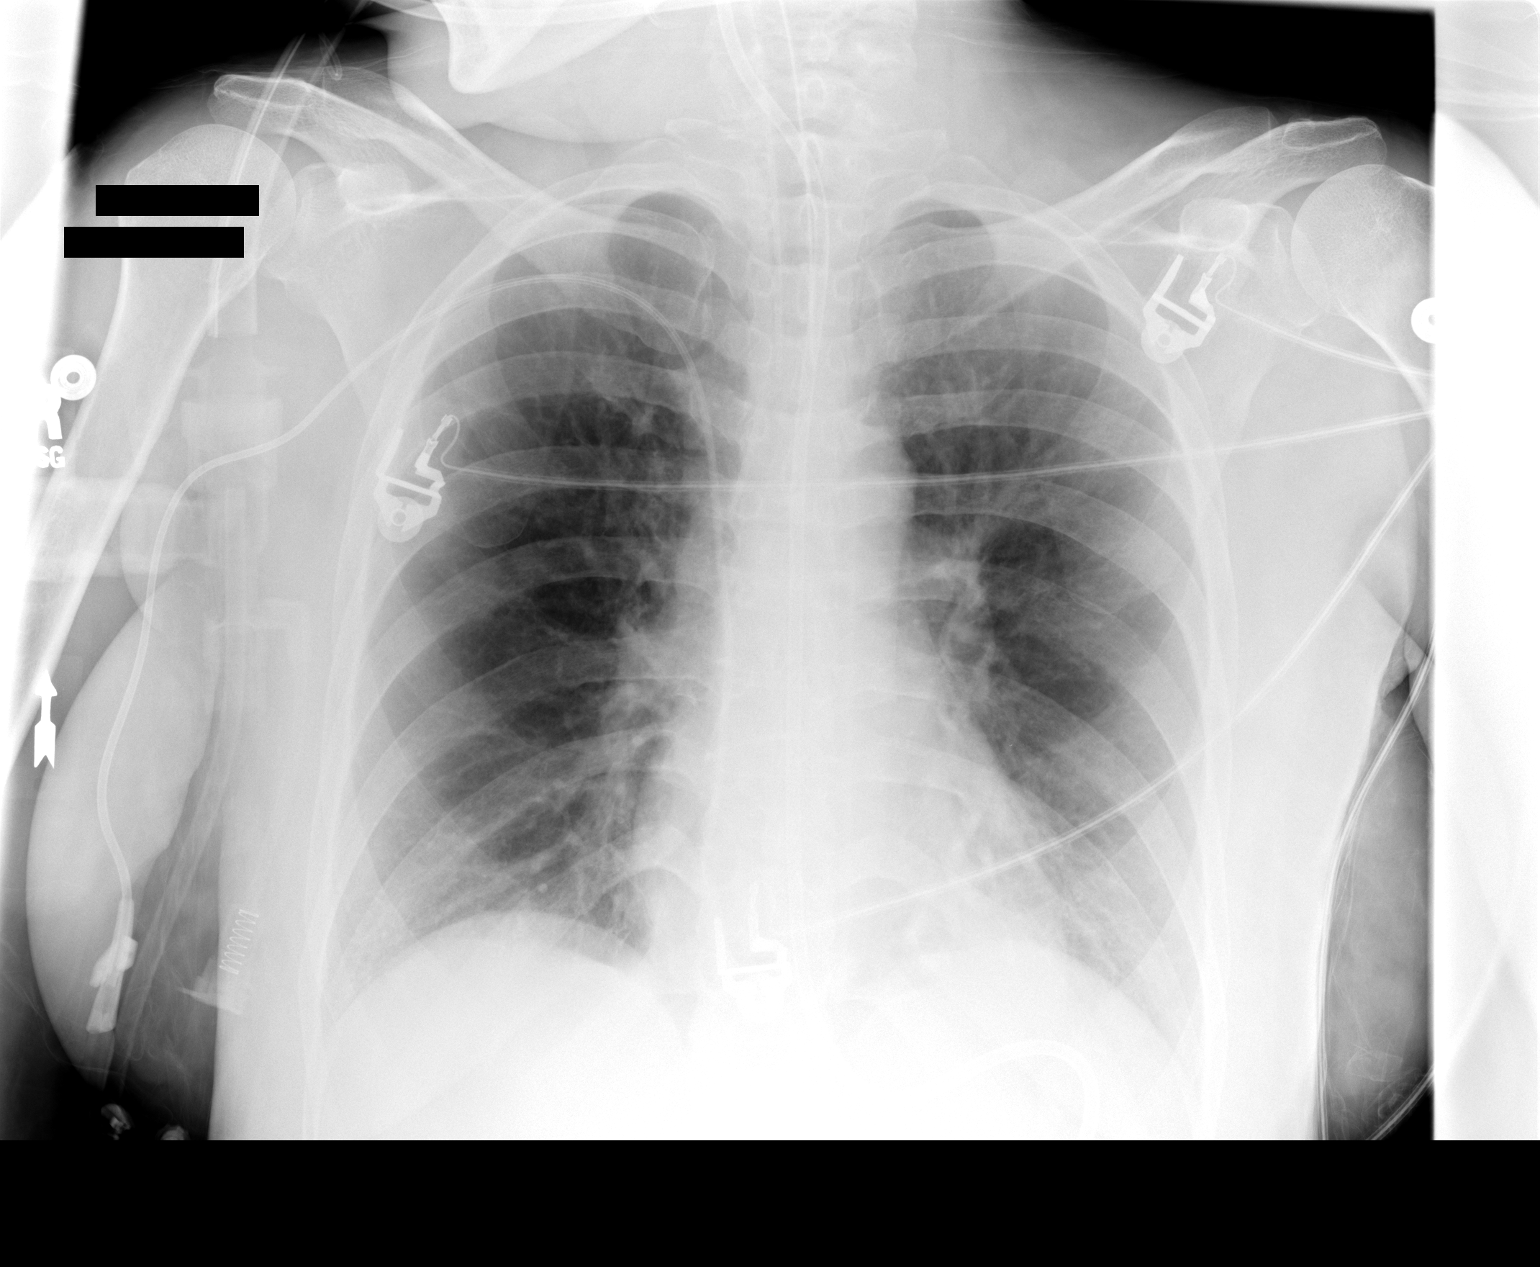

[1 of 1 positions shown; findings below may reference images not displayed]

Endotracheal tube tip 3.5 cm above the carina.  Right central line tip right atrium.  Feeding tube courses below the diaphragm.  No pneumothorax.  Minimal atelectatic changes left base.  
 IMPRESSION
 No significant change.

## 2005-03-16 IMAGING — CR DG CHEST 1V PORT
1 series · 1 of 1 positions shown · non-contrast
Comparison: none

CLINICAL DATA: Acute respiratory failure.  COPD.
 PORTABLE CHEST RADIOGRAPH - 07/27/03

[view not recorded]
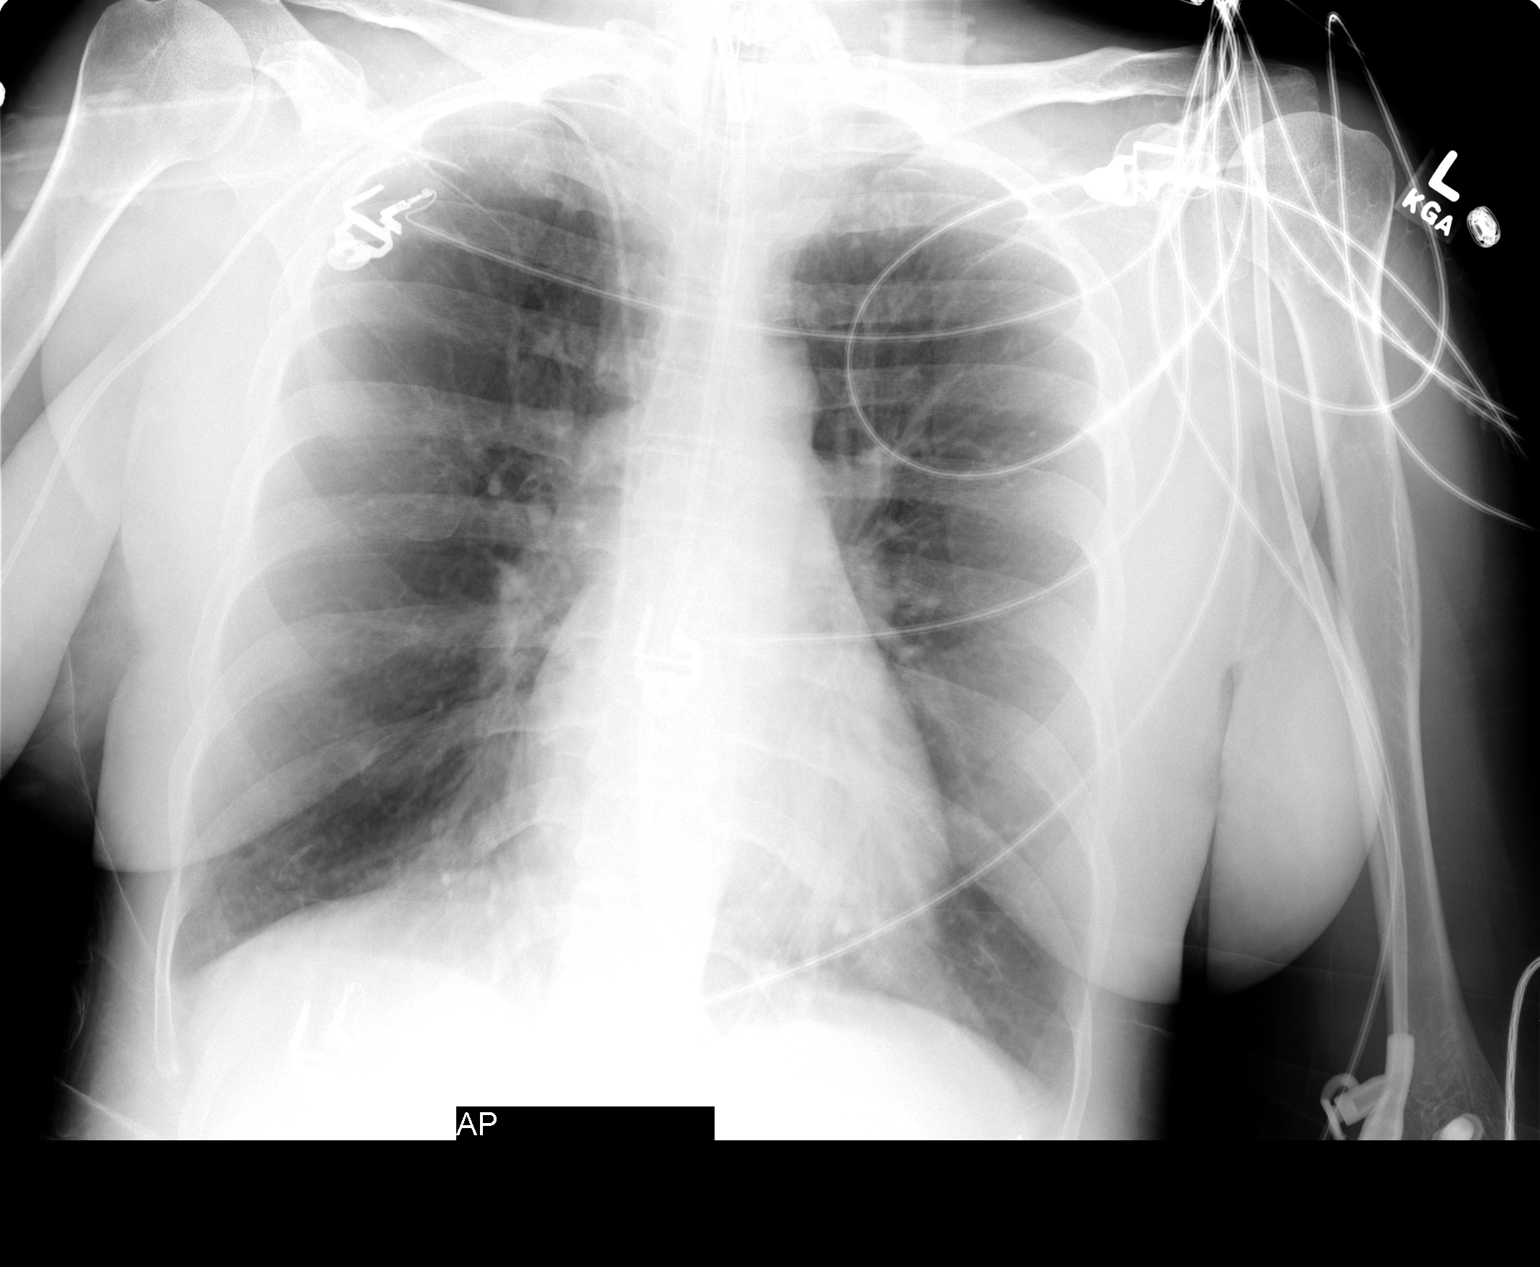

[1 of 1 positions shown; findings below may reference images not displayed]

FINDINGS: The right-sided central line tip is now at the cavoatrial junction.  Otherwise the support apparatus appears unchanged.  The lungs appear clear.  
 IMPRESSION
 1.  Right-sided central line tip is now at the cavoatrial junction.
 2.  Subsegmental atelectasis at the left lung base has cleared.

## 2005-03-18 IMAGING — CR DG CHEST 1V PORT
1 series · 1 of 1 positions shown · non-contrast
Comparison: 07/27/03.

CLINICAL DATA: acute respiratory failure; followup aeration
 PORTABLE CHEST 07/29/03 [DATE] hours:

[view not recorded]
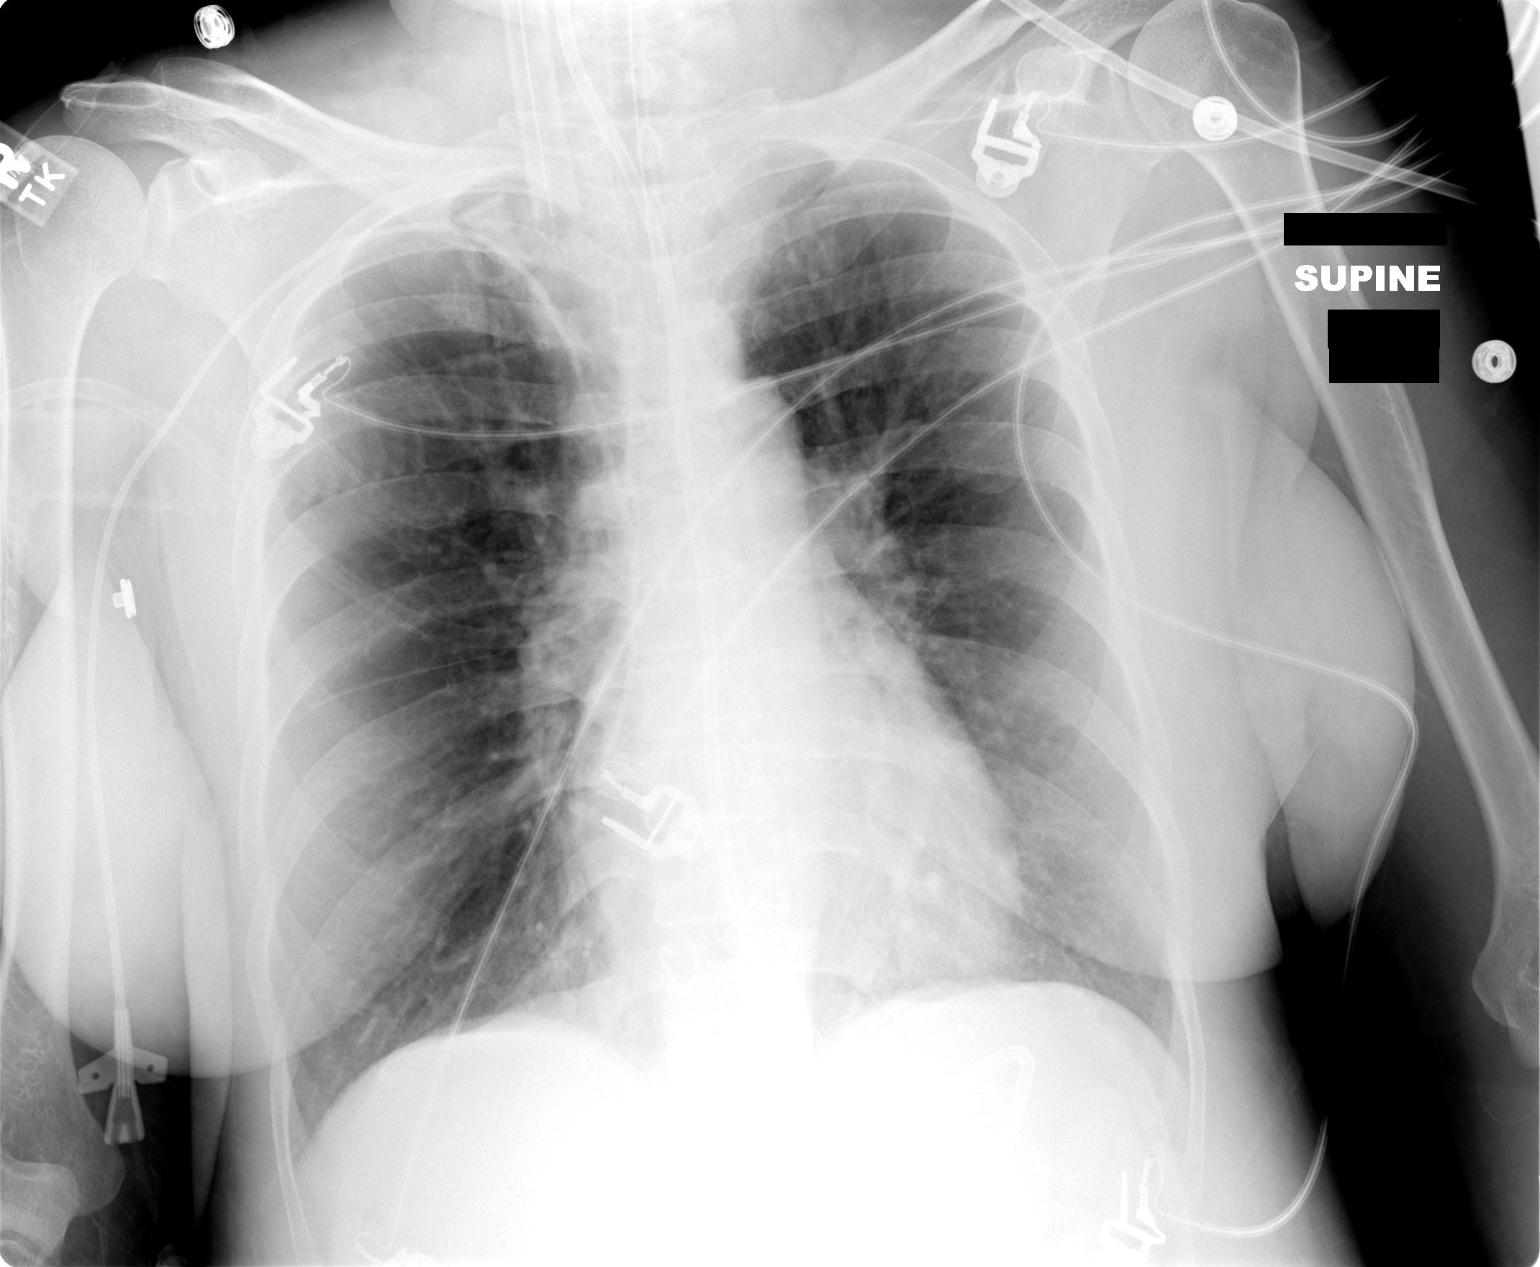

[1 of 1 positions shown; findings below may reference images not displayed]

There are changes of COPD with hyperinflation.  There is a feeding tube coursing into the stomach and there is a central venous catheter with the tip in the region of the right atrium.  There are no infiltrates.  Endotracheal tube tip is located approximately 6-7cm above the carina at the T2-3 level.
 IMPRESSION
 Moderately severe changes of COPD.  No acute infiltrate.

## 2005-03-20 IMAGING — CR DG CHEST 1V PORT
1 series · 1 of 1 positions shown · non-contrast
Comparison: 07/29/03.

CLINICAL DATA: Acute respiratory failure, COPD. 
 CHEST PORTABLE, ONE VIEW 07/31/03 AT [DATE] A.M.

[view not recorded]
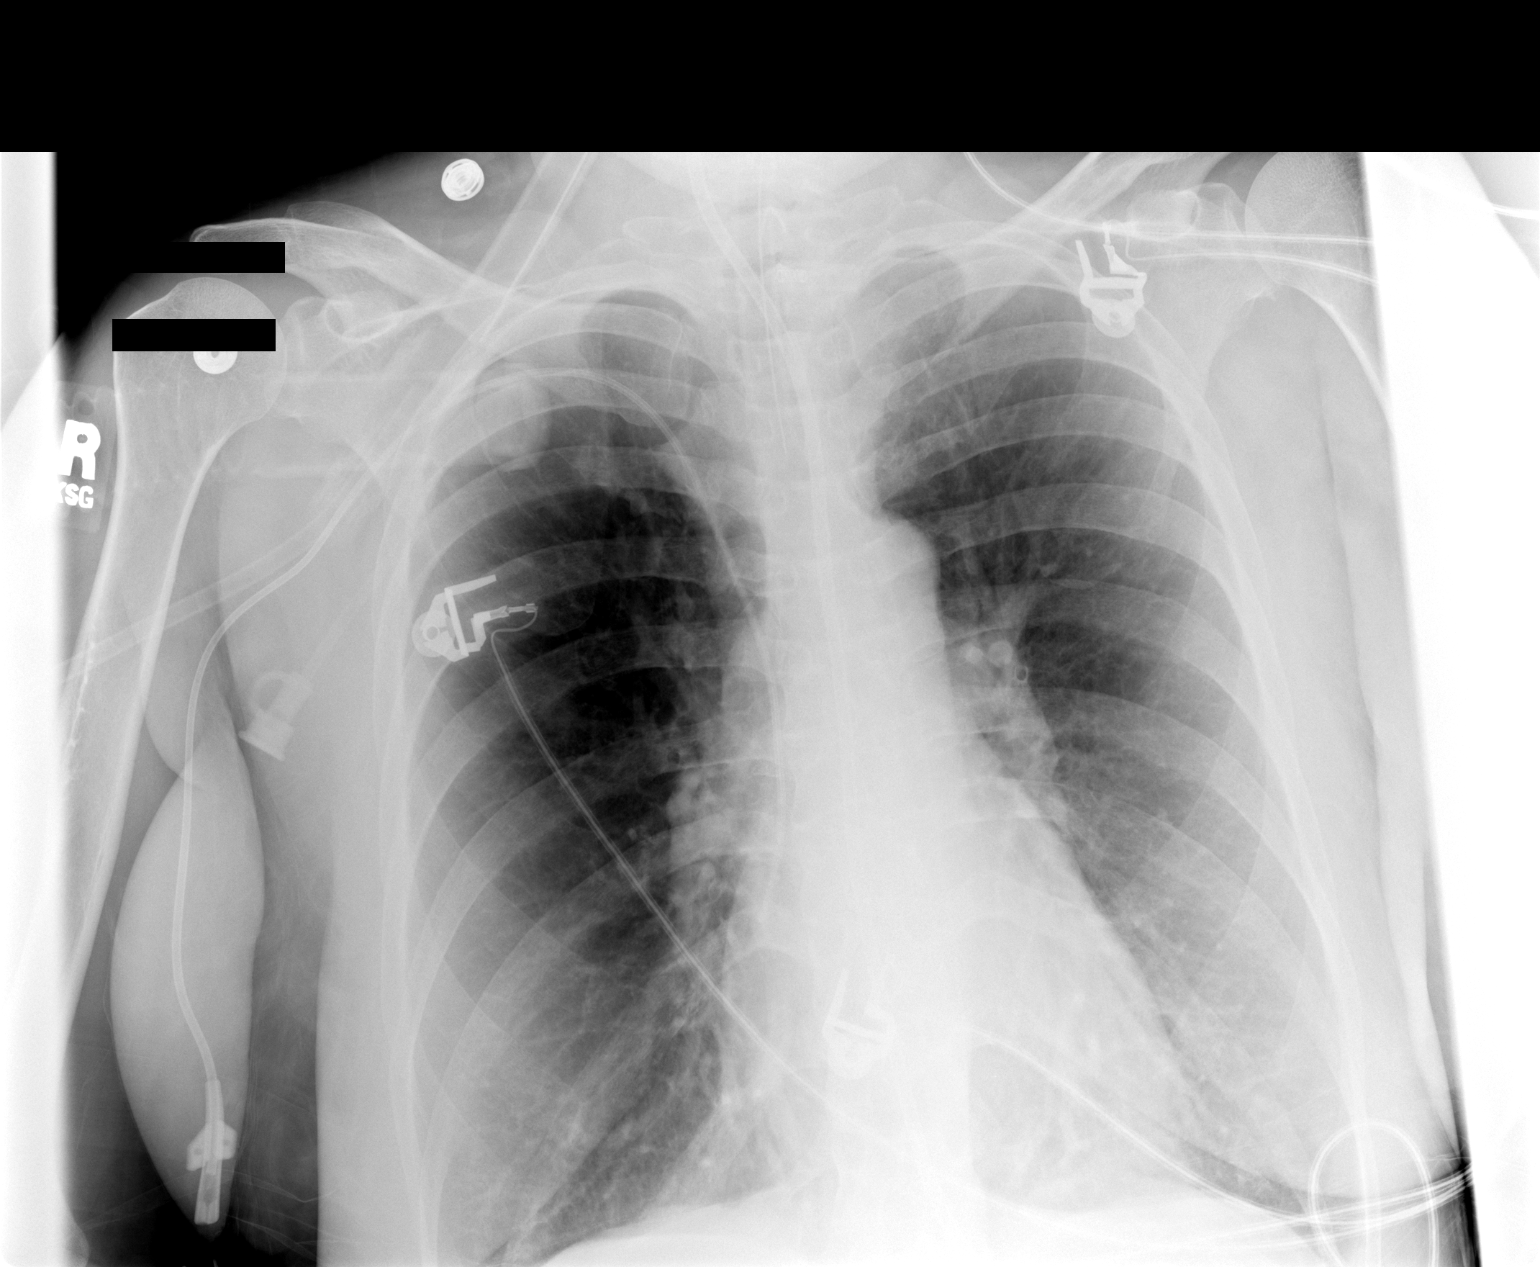

[1 of 1 positions shown; findings below may reference images not displayed]

Right-sided PICC line and endotracheal tube remain in placed unchanged.  COPD changes are present.  There are mild bronchitic changes with peribronchial thickening.  No definite focal opacity.  No effusions.  
 IMPRESSION
 COPD changes.  No acute disease.  No change.

## 2005-03-22 IMAGING — CR DG CHEST 1V PORT
1 series · 1 of 1 positions shown · non-contrast
Comparison: 07/31/03.

CLINICAL DATA: 52-year-old female in acute respiratory distress.
 PORTABLE CHEST, ONE VIEW

[view not recorded]
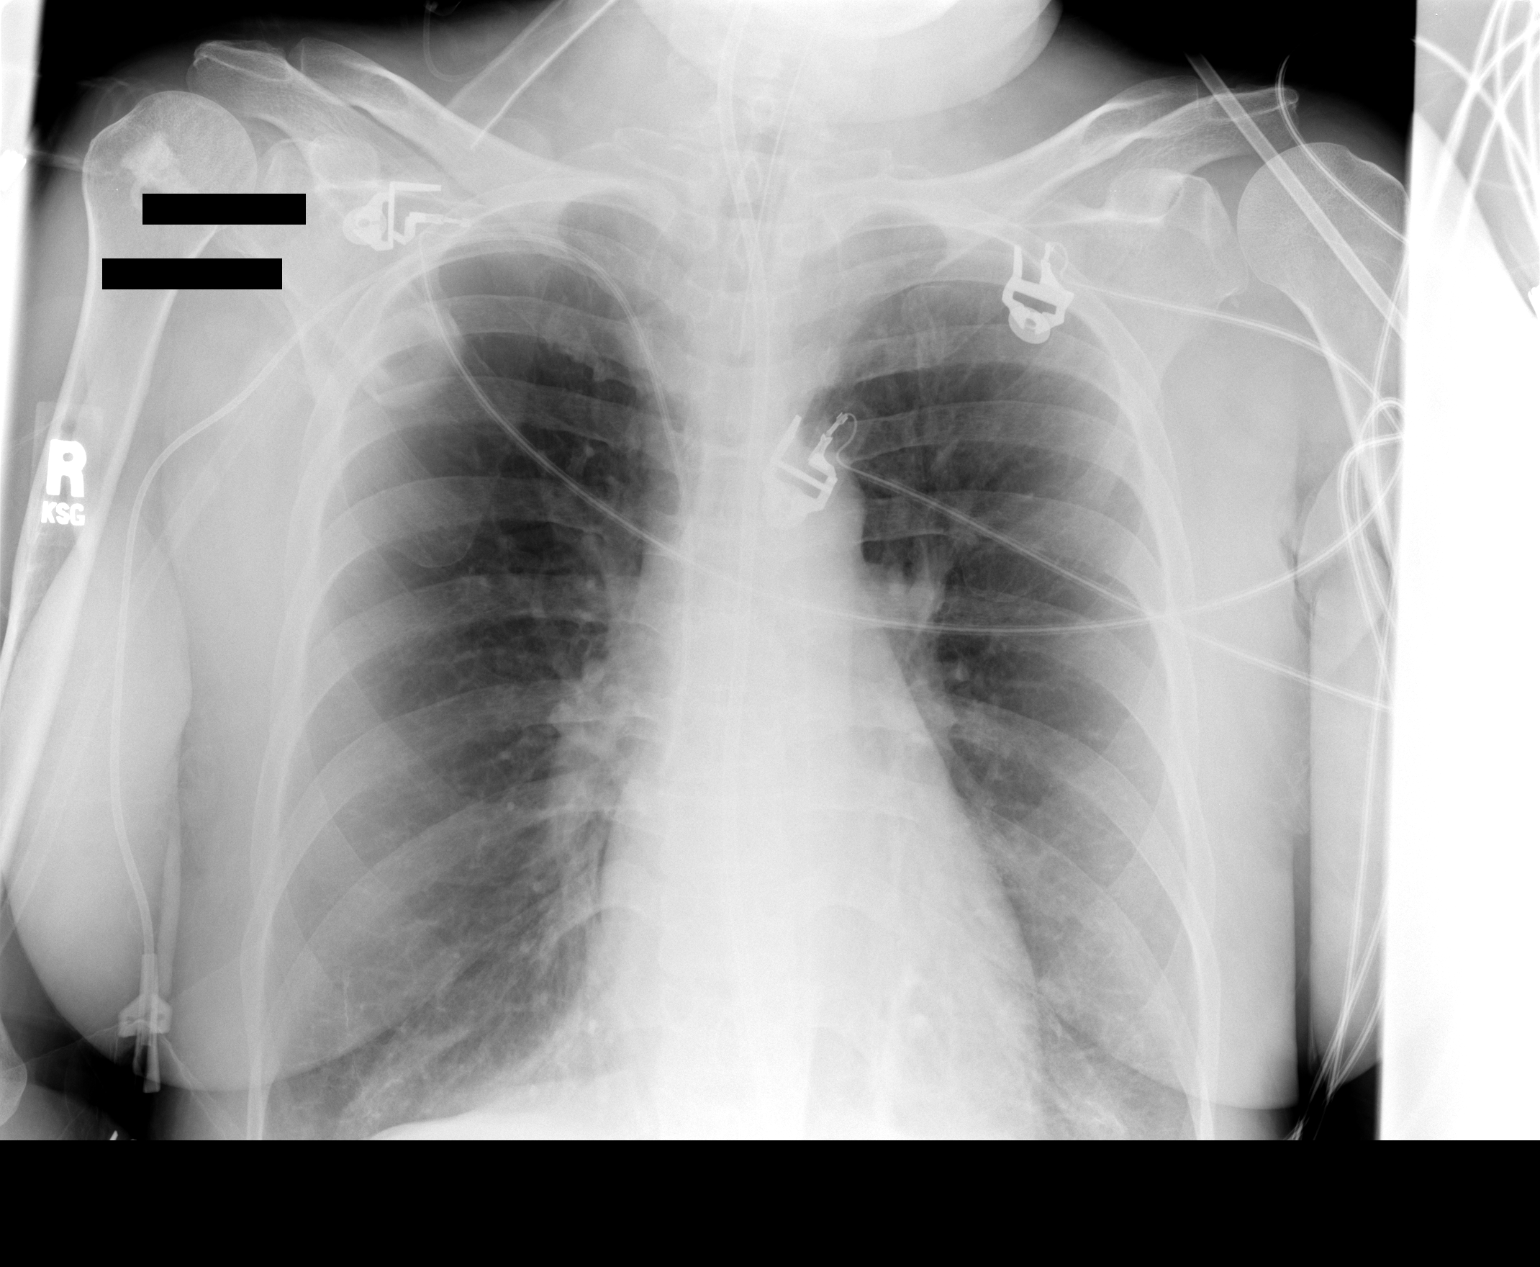

[1 of 1 positions shown; findings below may reference images not displayed]

FINDINGS: Portable chest radiograph 08/02/03 at [DATE] a.m.
 Endotracheal tube, right PICC line, and feeding tube all remain.  Minimal basilar atelectasis is evident without acute consolidation, effusion, edema, or pneumothorax.  The heart size is normal.  
 IMPRESSION
 Minimal basilar atelectasis.

## 2005-03-23 IMAGING — CR DG CHEST 1V PORT
1 series · 1 of 1 positions shown · non-contrast
Comparison: 08/02/03.

CLINICAL DATA: acute respiratory failure 
 PORTABLE CHEST 08/03/03 [DATE] hours:

[view not recorded]
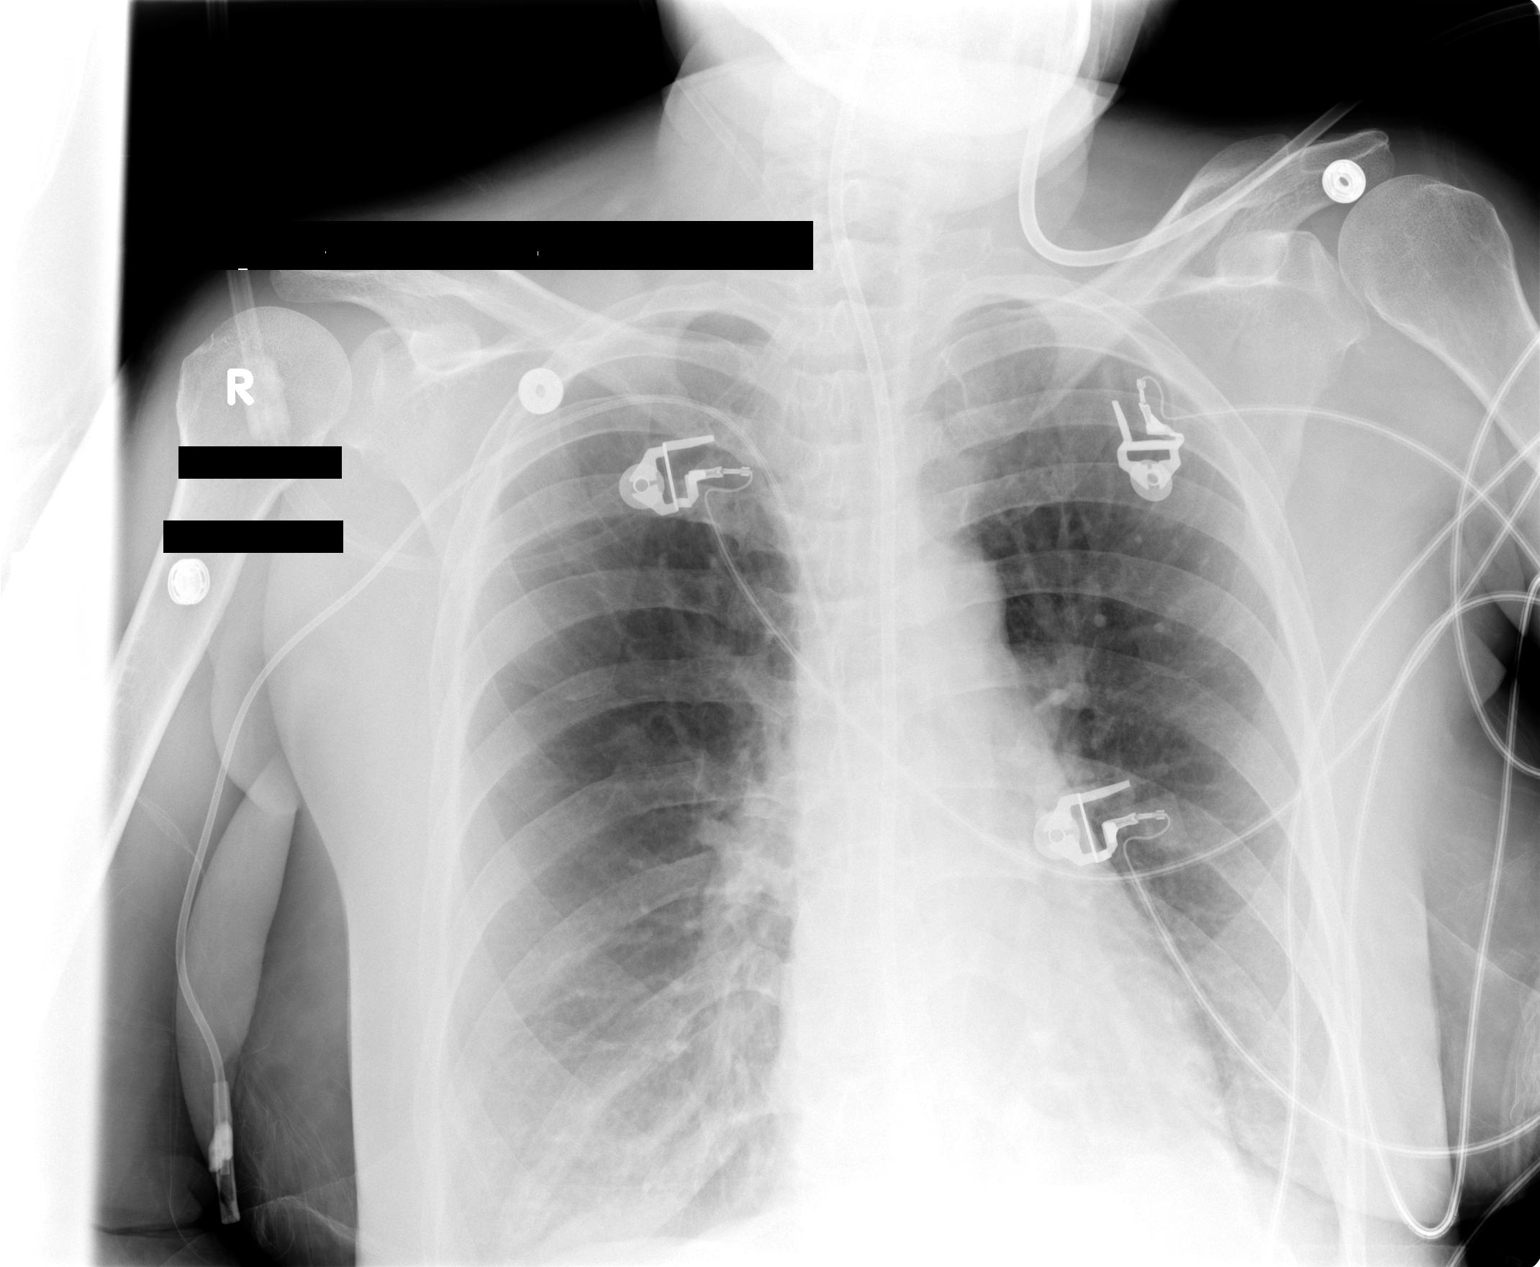

[1 of 1 positions shown; findings below may reference images not displayed]

There has been interval extubation.  Feeding tube and right-sided PICC remain in place.  There has been slight worsening in the basilar aeration.  The lungs are otherwise clear.  Cardiomediastinal contours appear stable.  
 IMPRESSION
 Mild worsening of bibasilar atelectasis status post extubation.

## 2005-04-10 ENCOUNTER — Ambulatory Visit: Payer: Self-pay | Admitting: Critical Care Medicine

## 2005-04-17 IMAGING — CR DG CHEST 2V
2 series · 2 of 2 positions shown · non-contrast
Comparison: [DATE] and 08/03/2003.

CLINICAL DATA: Cough and shortness of breath.  COPD.  Asthma.  
 AP AND LATERAL CHEST ? 08/28/2003

[view not recorded (1 of 2)]
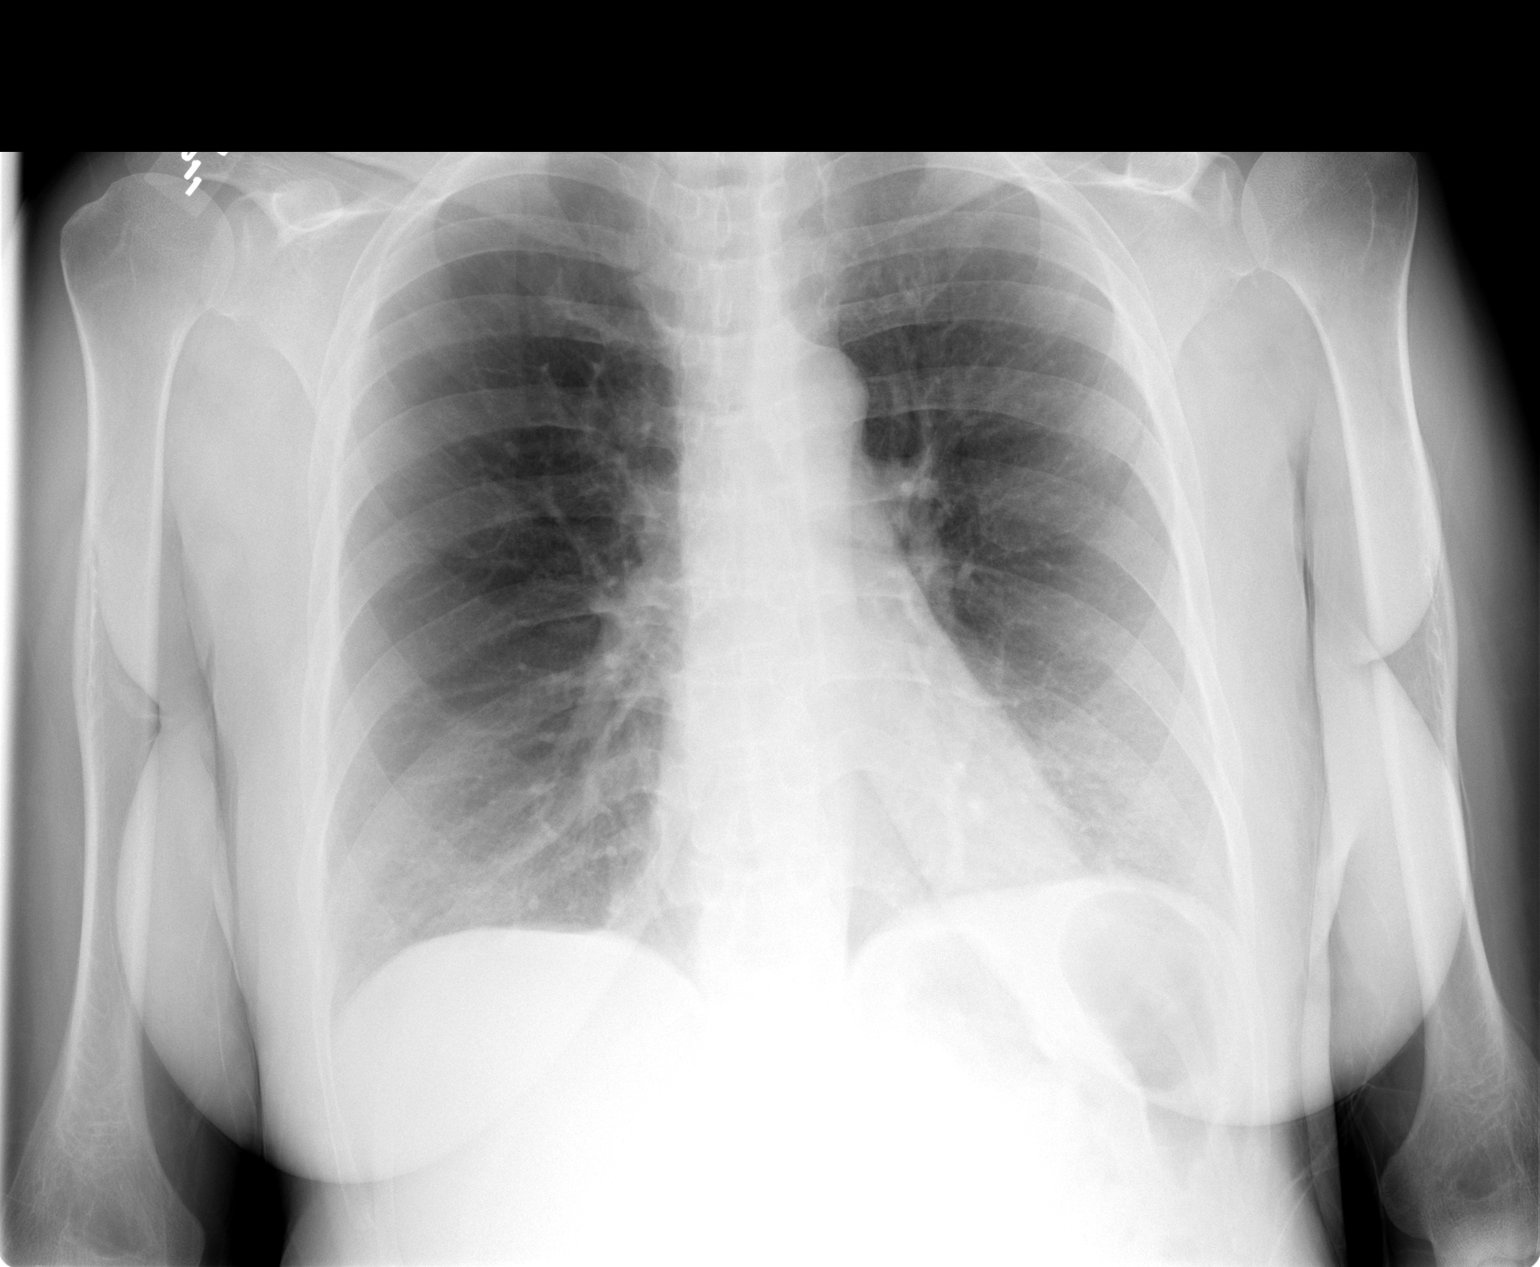

[view not recorded (2 of 2)]
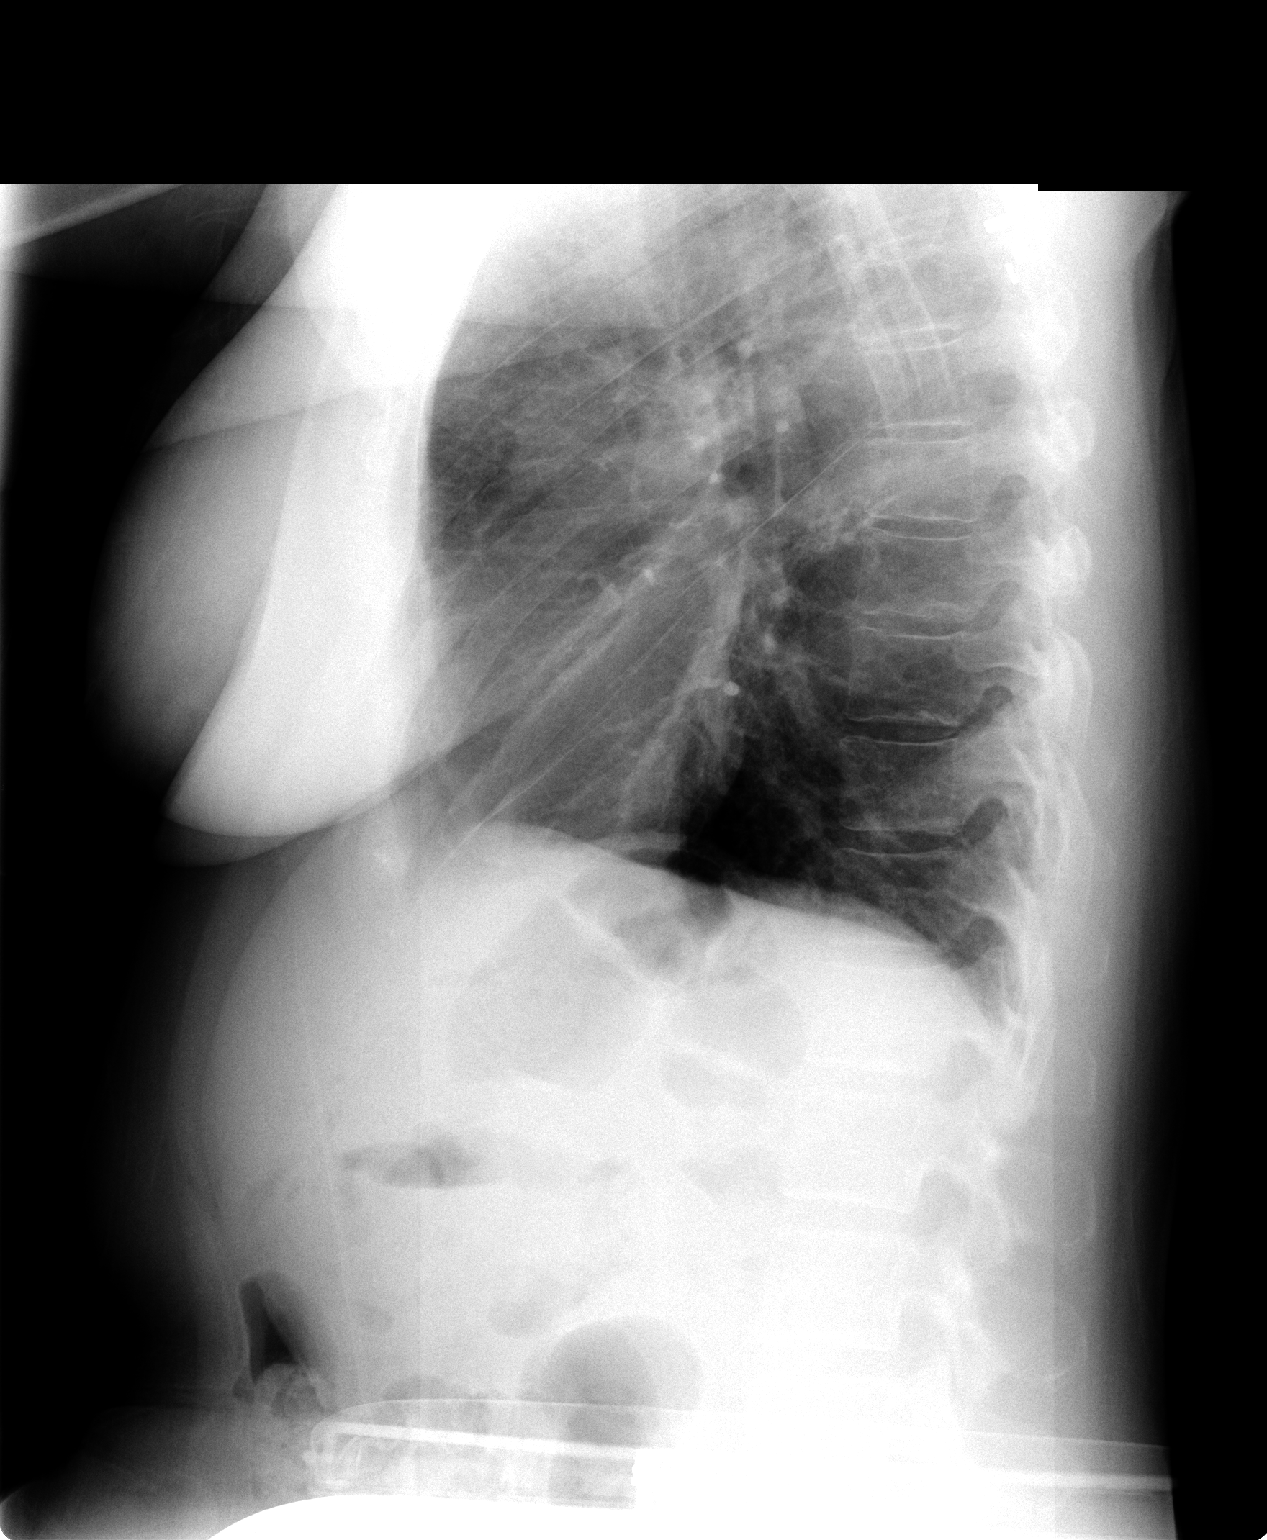

[2 of 2 positions shown; findings below may reference images not displayed]

The lungs are somewhat hyperinflated but there are no infiltrates or effusions and the pulmonary vascularity is normal.  
 IMPRESSION
 No acute disease.

## 2005-04-28 ENCOUNTER — Ambulatory Visit: Payer: Self-pay | Admitting: Pulmonary Disease

## 2005-04-30 ENCOUNTER — Ambulatory Visit: Payer: Self-pay | Admitting: Critical Care Medicine

## 2005-05-12 ENCOUNTER — Ambulatory Visit: Payer: Self-pay | Admitting: Internal Medicine

## 2005-05-26 HISTORY — PX: COLON SURGERY: SHX602

## 2005-05-26 HISTORY — PX: ILEOSTOMY: SHX1783

## 2005-06-11 ENCOUNTER — Ambulatory Visit: Payer: Self-pay | Admitting: Critical Care Medicine

## 2005-06-25 ENCOUNTER — Ambulatory Visit: Payer: Self-pay | Admitting: Internal Medicine

## 2005-07-31 IMAGING — CT CT PELVIS W/ CM
1 of 4 series · 14 of 32 positions shown, 19 images · IV contrast (omnipaque)
Comparison: none

CLINICAL DATA: Abdominal pain, vomiting.
 CT ABDOMEN AND PELVIS WITH CONTRAST, 12/11/03
 Multidetector helical CT images performed through the abdomen and pelvis following dilute oral contrast and 100 cc Omnipaque 300 IV.
 CT ABDOMEN
 The liver, spleen, pancreas, adrenals, kidneys, and gallbladder unremarkable.  No free fluid, free air, or adenopathy.
 There is wall thickening involving the right side of the colon and transverse colon to just below the splenic flexure.  This is compatible with colitis.  Mesenteric vessels appear normal and therefore this is thought to be most likely infectious or inflammatory.  Scattered mesenteric lymph nodes present.  
 IMPRESSION 
 Colonic wall thickening involving the right side of the colon and ascending colon as well as splenic flexure.  Concerning for colitis, either infectious inflammatory, or less likely ischemic.
 CT PELVIS
 The appendix is visualized and is normal.  The descending colon and sigmoid colon do not appear as thickened.  Small amount of free fluid in the pelvis.  Patient is status post hysterectomy.  No free fluid or adenopathy.
 Appendix normal.  Findings of colitis as discussed above.
 Small amount of free fluid in the pelvis.

[Series 3: — · axial · 0.58mm/px · z∈[+926,+1266]mm · 14 of 76 slices shown, 19 images]
[im 4/76  soft-tissue]
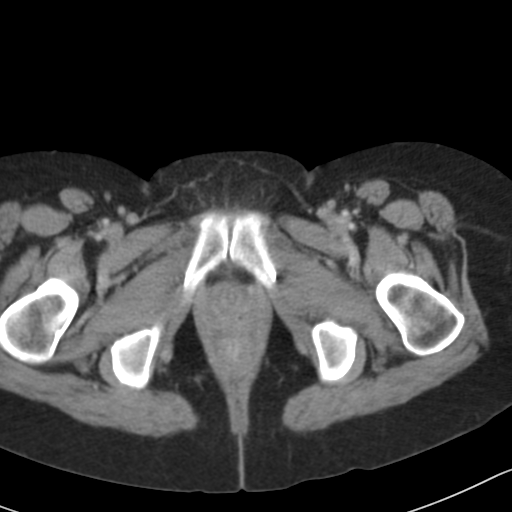
[im 4/76  bone]
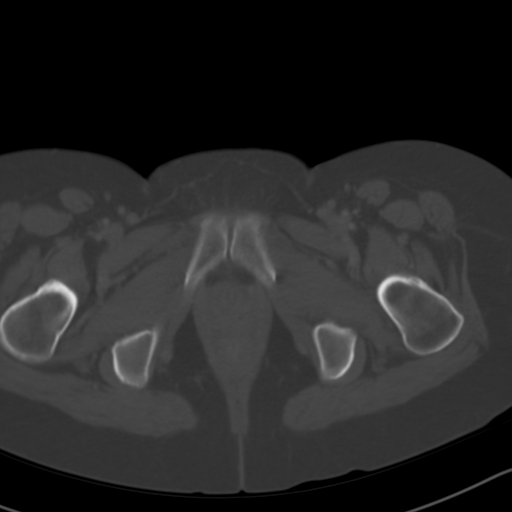
[im 12/76  soft-tissue]
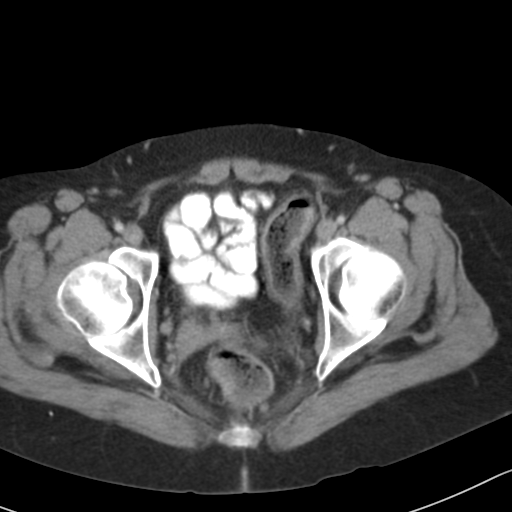
[im 16/76  soft-tissue]
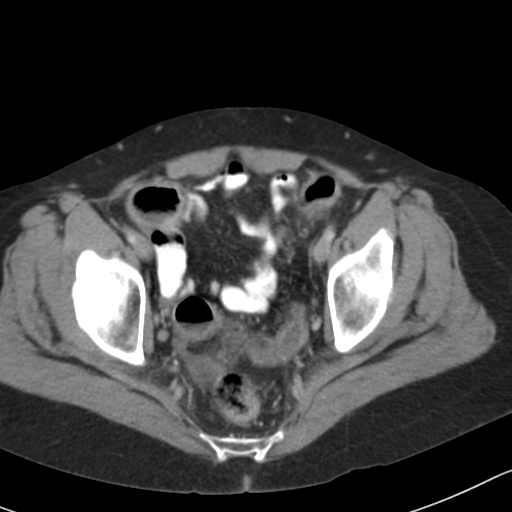
[im 23/76  soft-tissue]
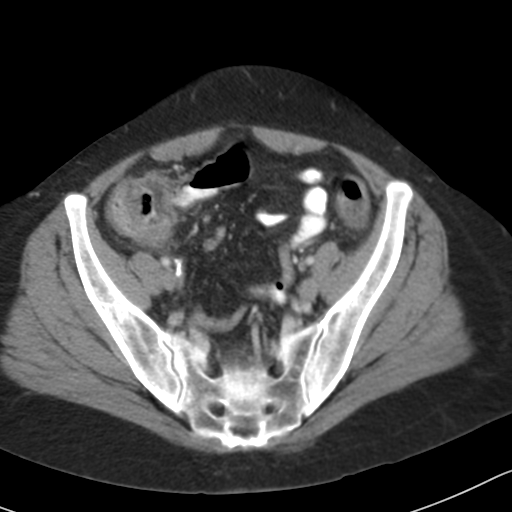
[im 27/76  soft-tissue]
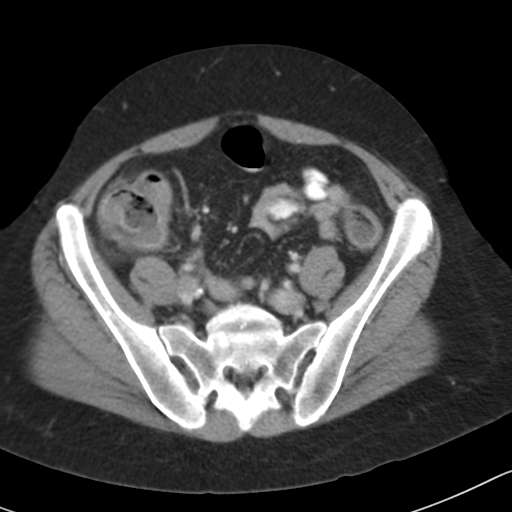
[im 34/76  soft-tissue]
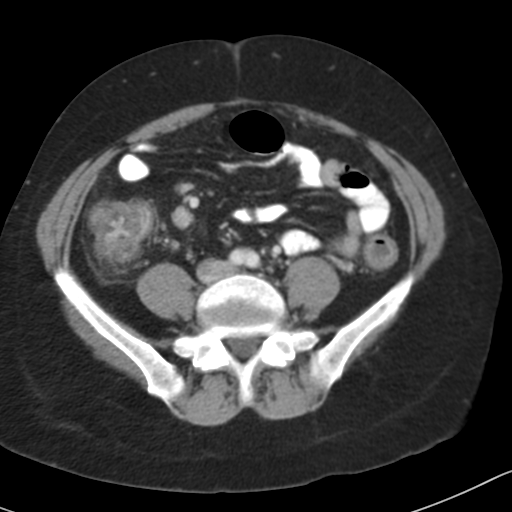
[im 38/76  soft-tissue]
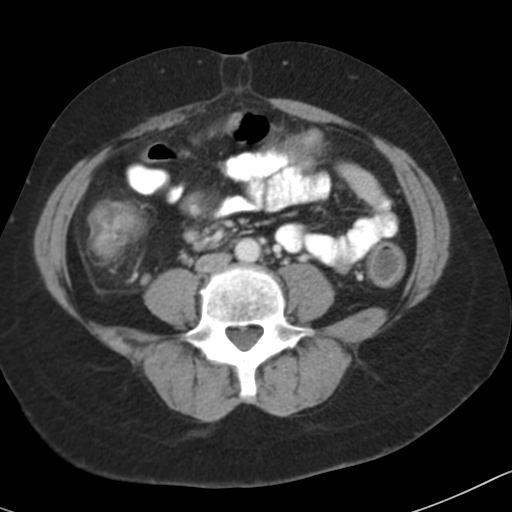
[im 42/76  soft-tissue]
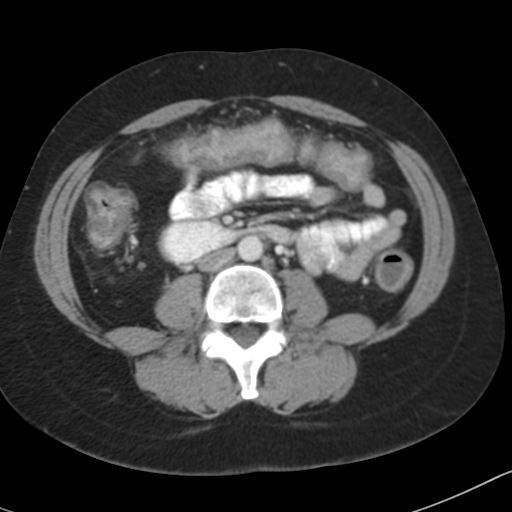
[im 49/76  soft-tissue]
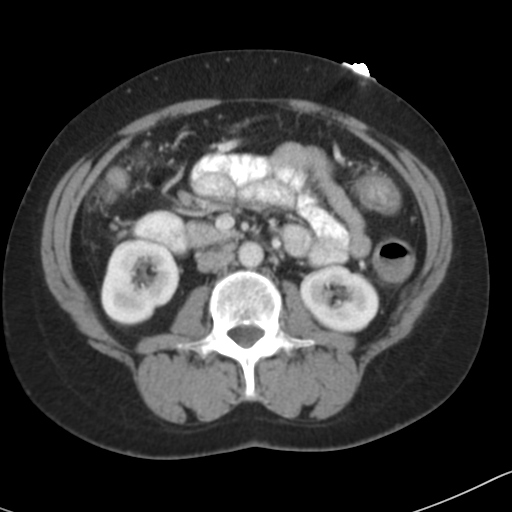
[im 49/76  bone]
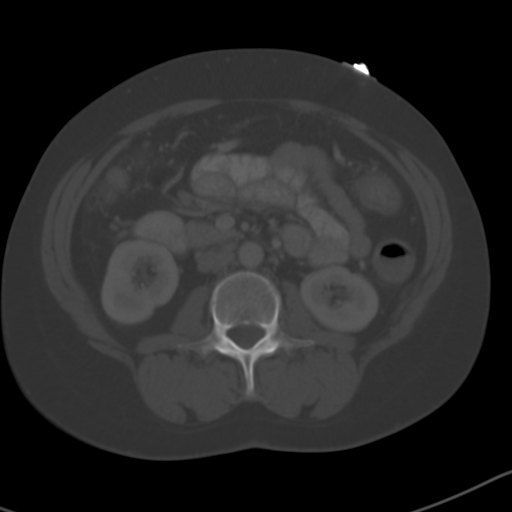
[im 53/76  soft-tissue]
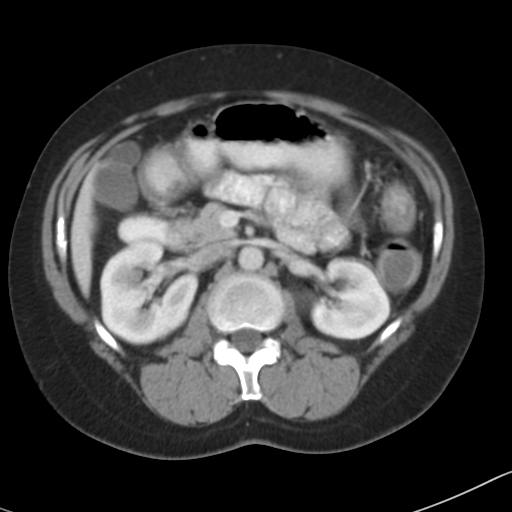
[im 61/76  soft-tissue]
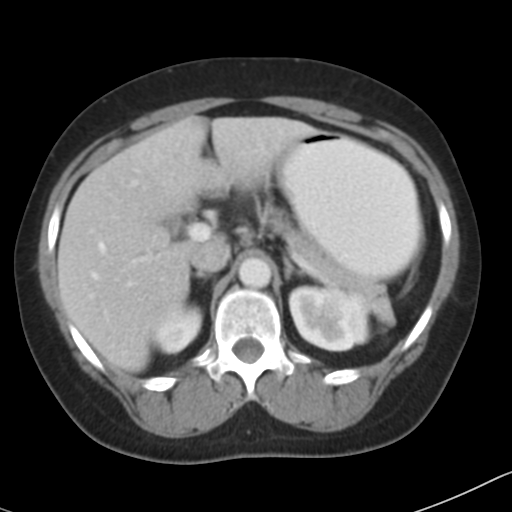
[im 61/76  lung]
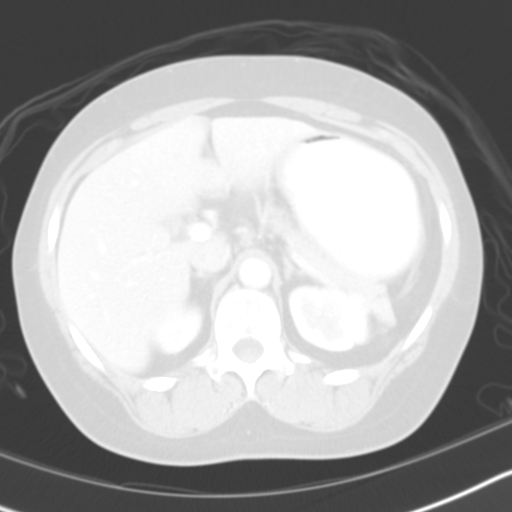
[im 64/76  soft-tissue]
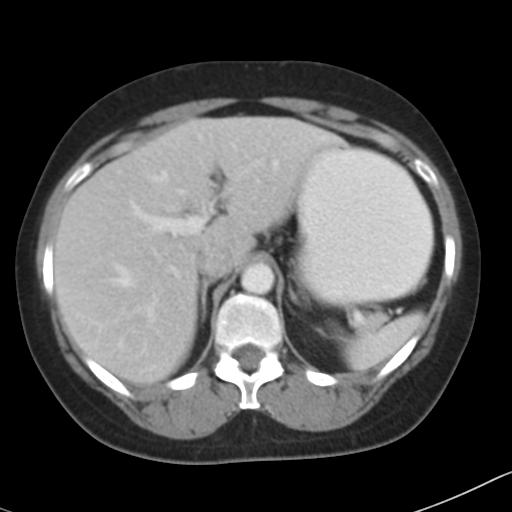
[im 64/76  lung]
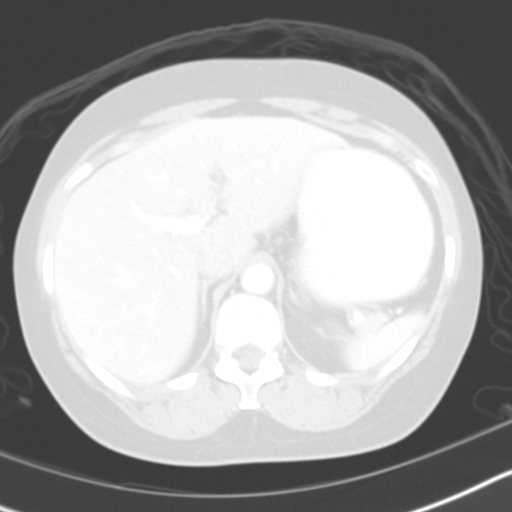
[im 68/76  lung]
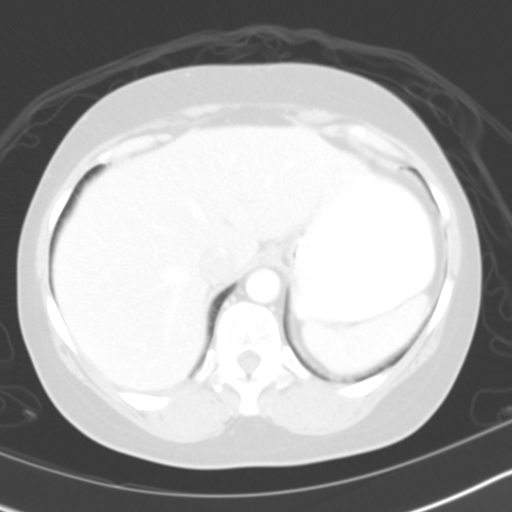
[im 72/76  soft-tissue]
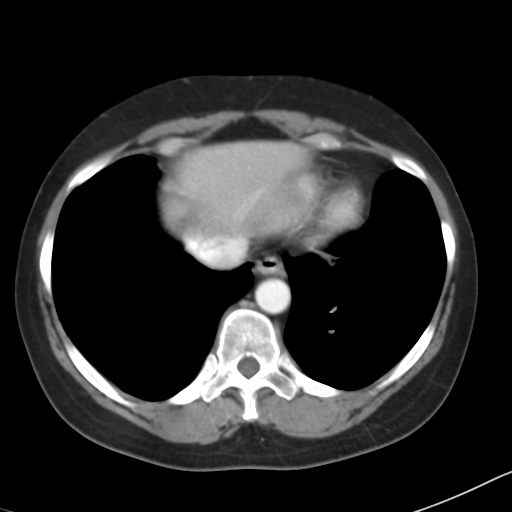
[im 72/76  lung]
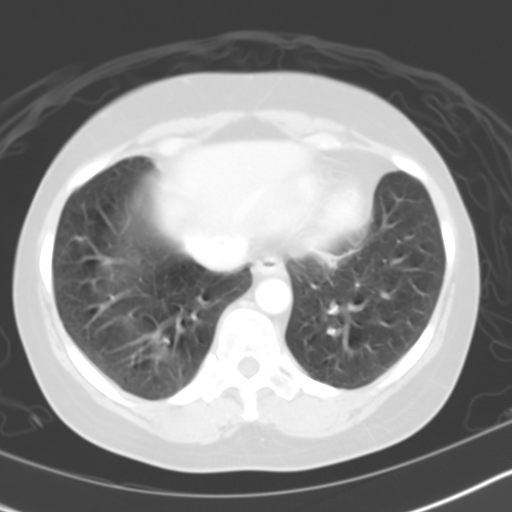

[14 of 32 positions shown; findings below may reference images not displayed]

## 2005-07-31 IMAGING — CR DG ABDOMEN ACUTE W/ 1V CHEST
3 series · 3 of 3 positions shown · non-contrast
Comparison: none

CLINICAL DATA: Lower abdominal pain, diarrhea.
 ACUTE ABDOMINAL SERIES WITH CHEST 
 Heart and mediastinal contours are within normal limits.  Lungs are clear.  No effusions. 
 There is a nonobstructive bowel gas pattern.  No evidence of free air.  Mildly prominent portion of colon in the region of the splenic flexure is noted with question of wall thickening involving this loop of bowel. 
 IMPRESSION
 No evidence of bowel obstruction.  Question wall thickening within the colon in the region of the splenic flexure. 
 No acute cardiopulmonary disease.

[view not recorded (1 of 3)]
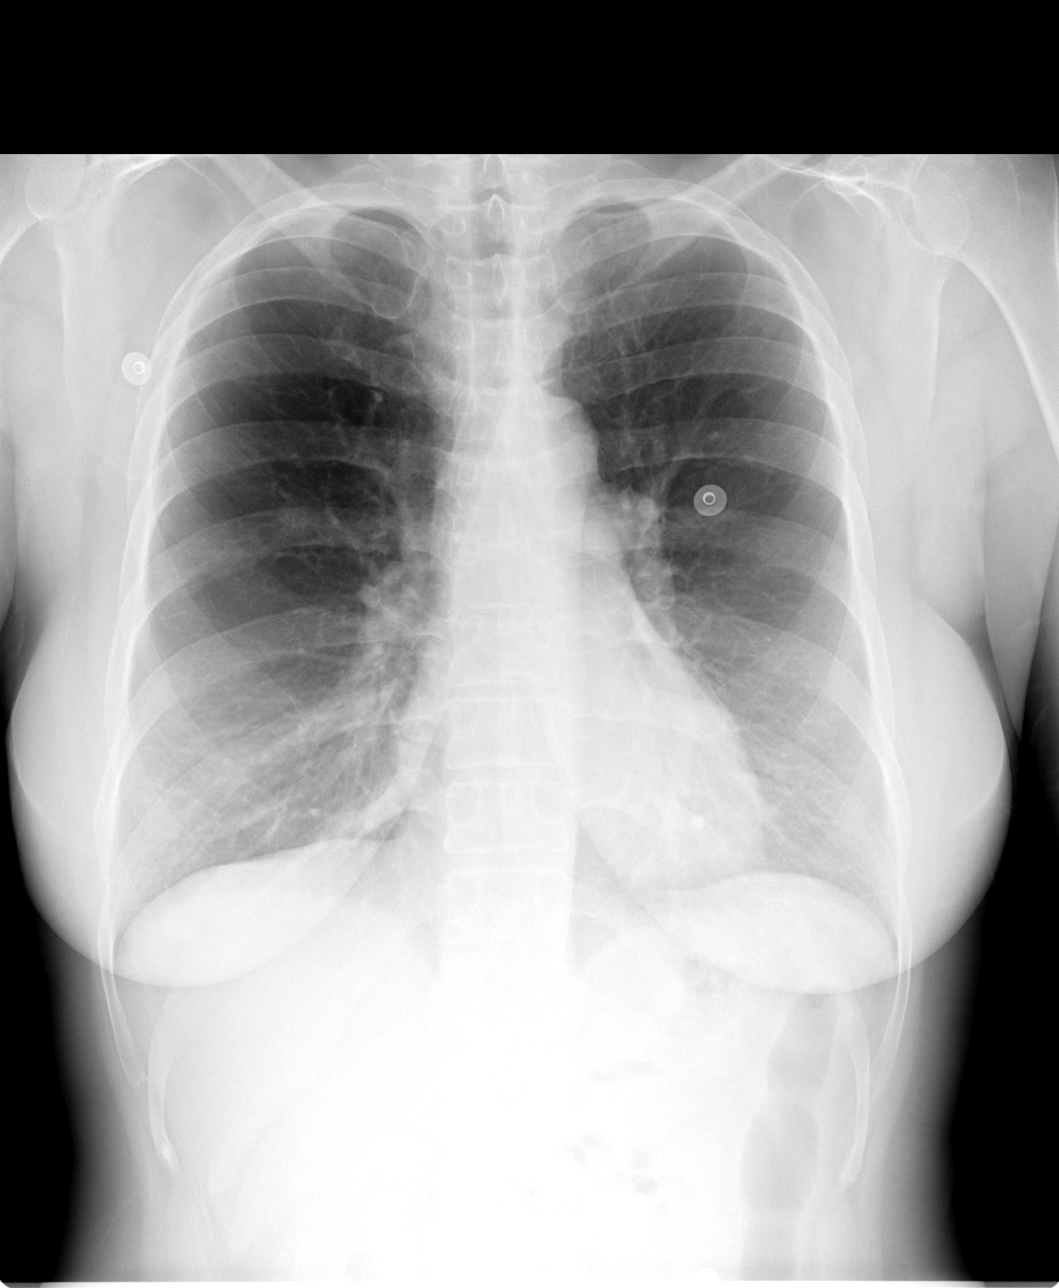

[view not recorded (2 of 3)]
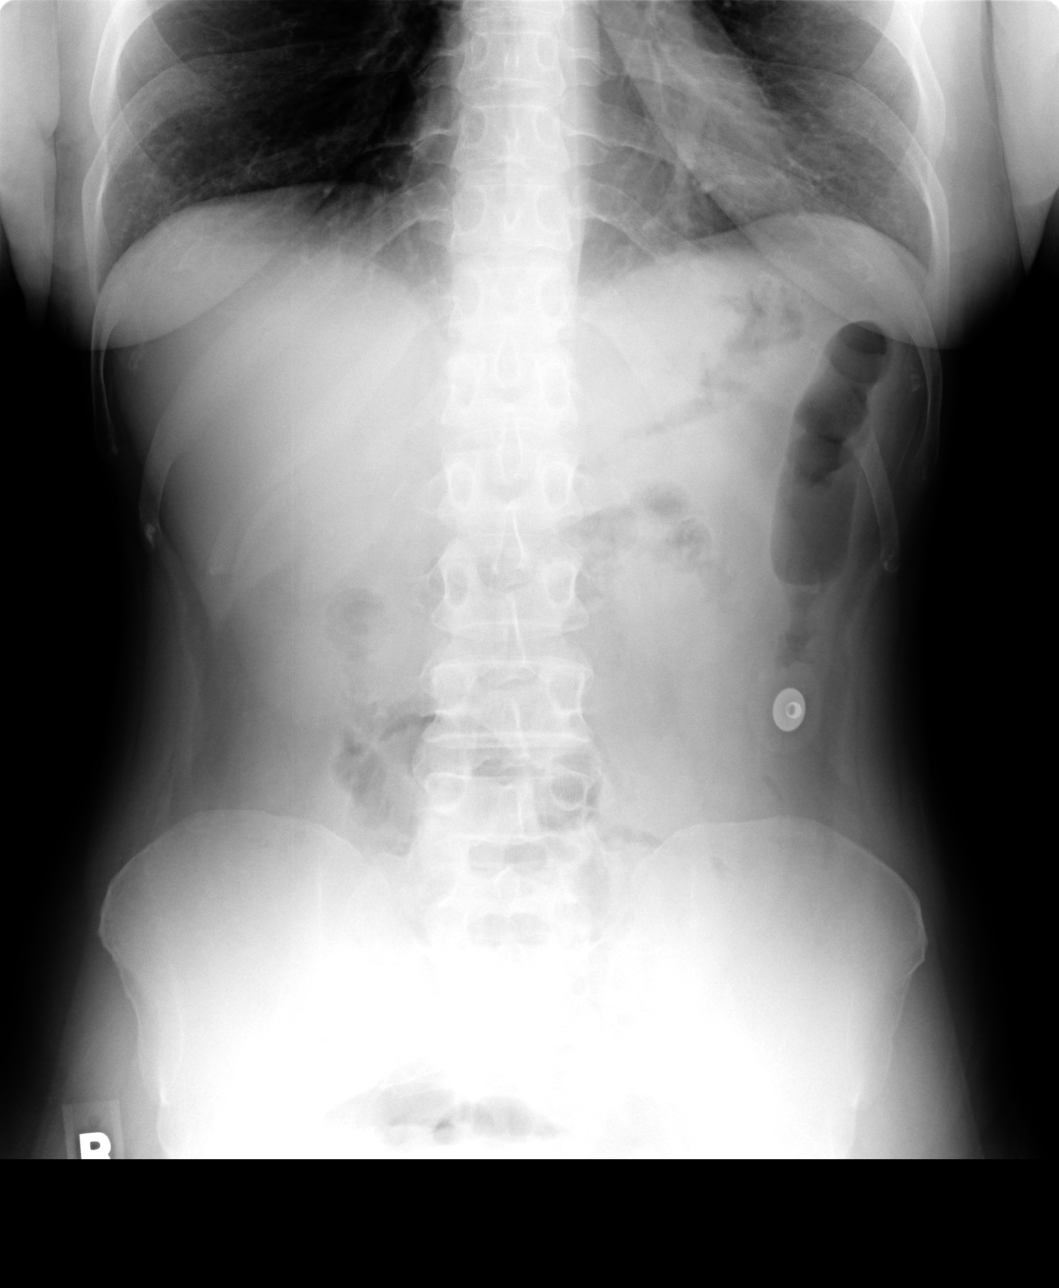

[view not recorded (3 of 3)]
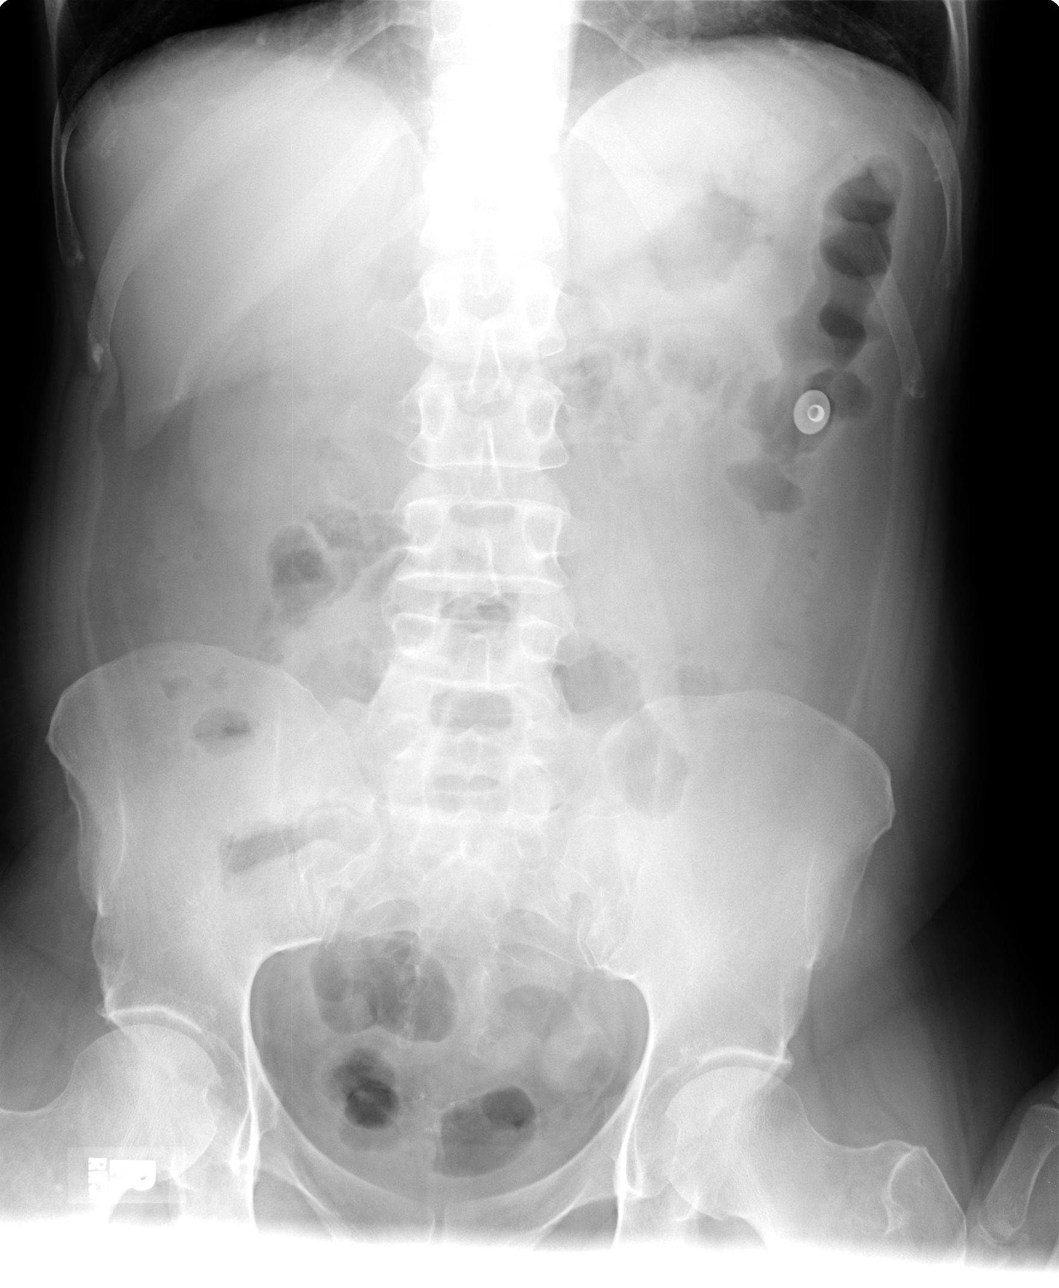

[3 of 3 positions shown; findings below may reference images not displayed]

## 2005-08-02 IMAGING — CR DG ABDOMEN ACUTE W/ 1V CHEST
3 series · 3 of 3 positions shown · non-contrast
Comparison: 12/11/03.

CLINICAL DATA: Crohn?s colitis, abdominal pain.
ACUTE ABDOMEN WITH PA CHEST

[view not recorded (1 of 3)]
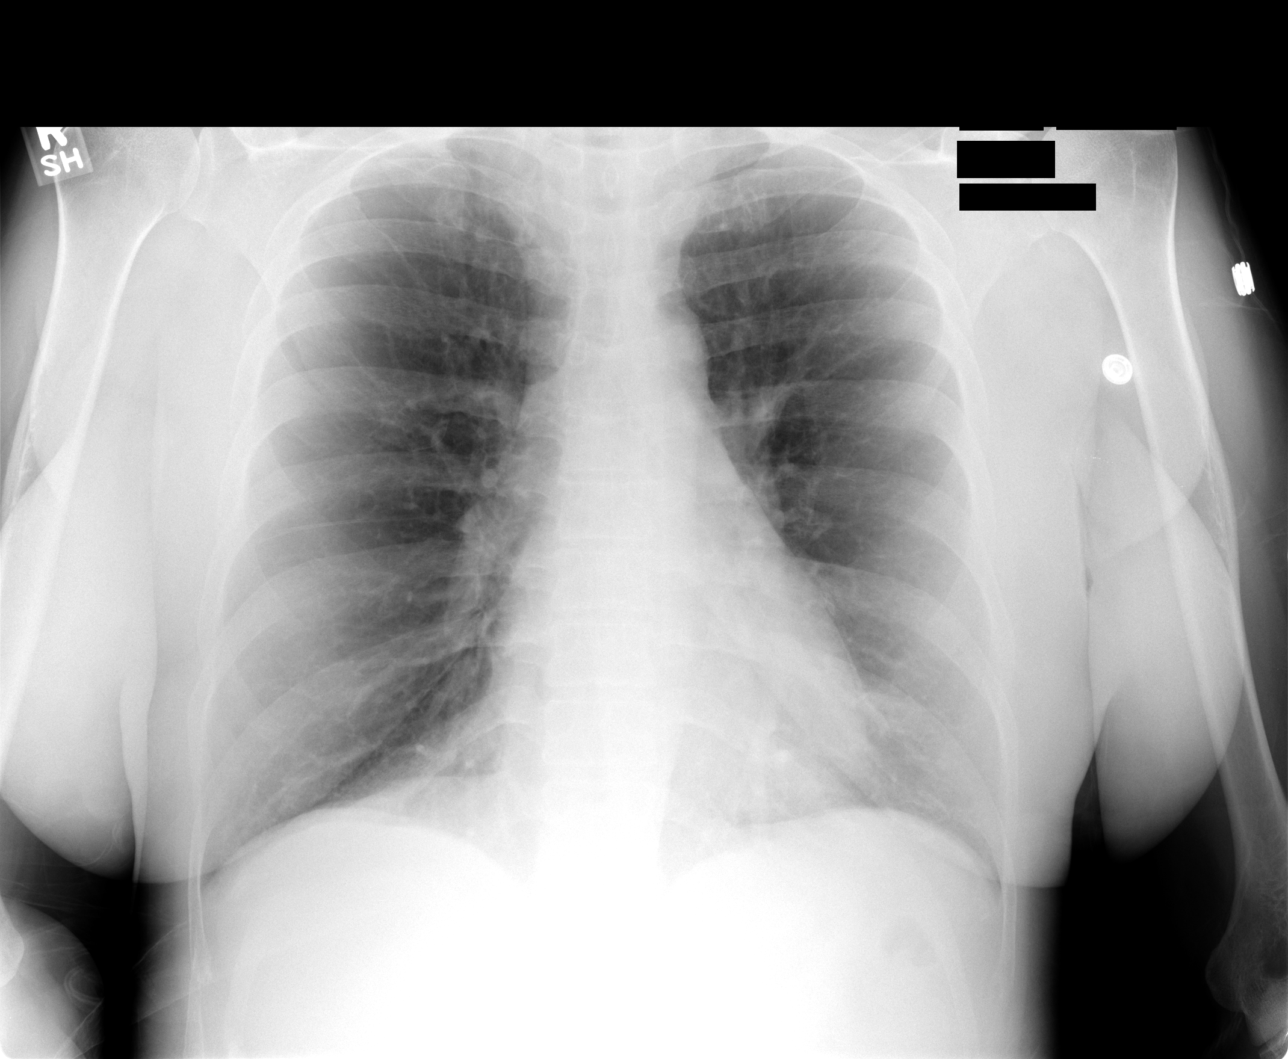

[view not recorded (2 of 3)]
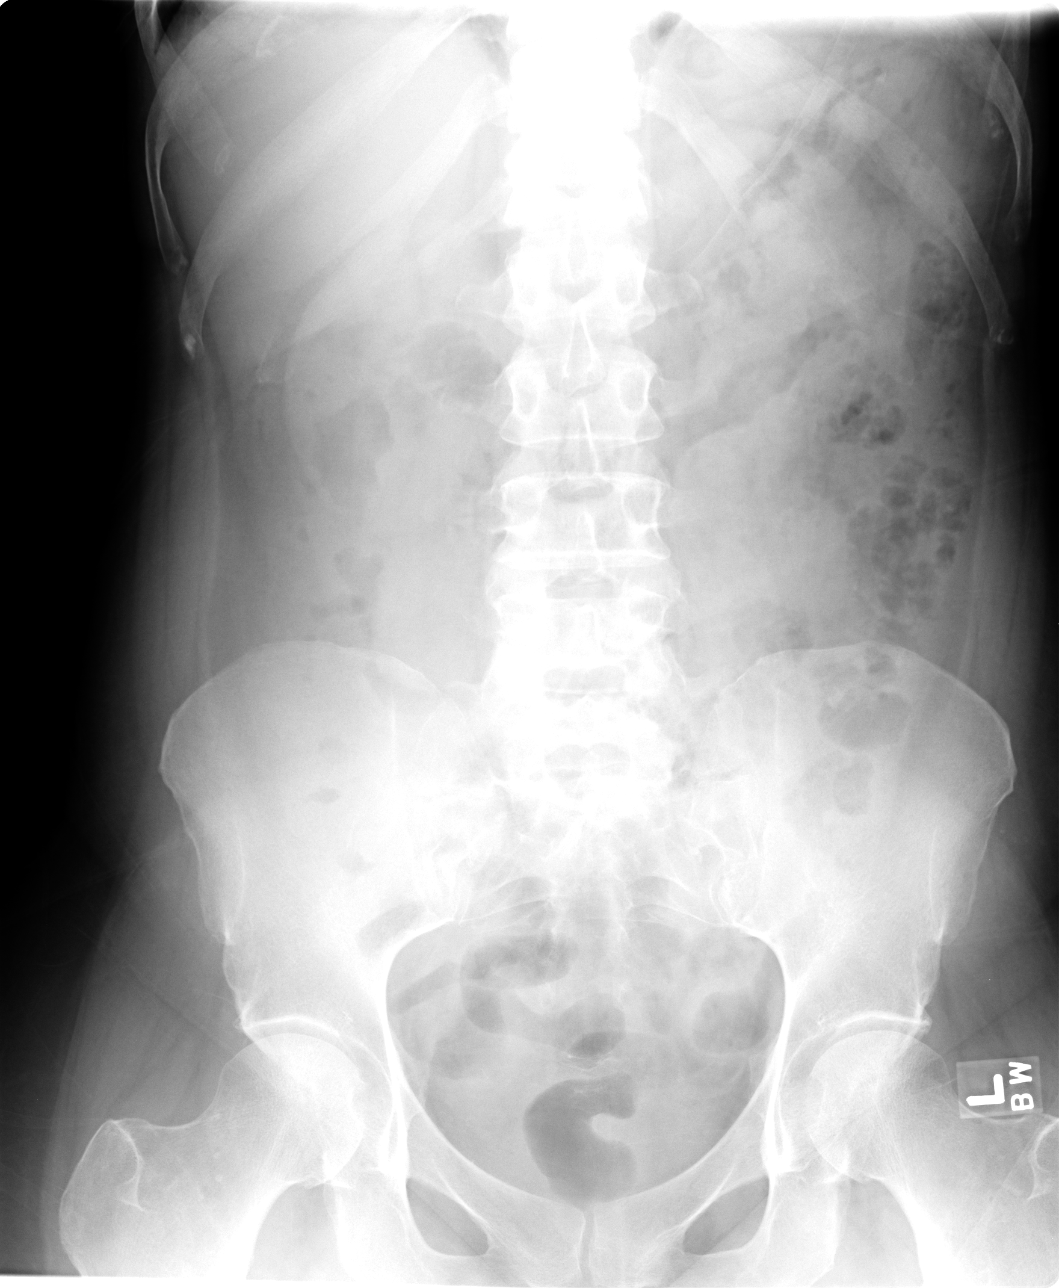

[view not recorded (3 of 3)]
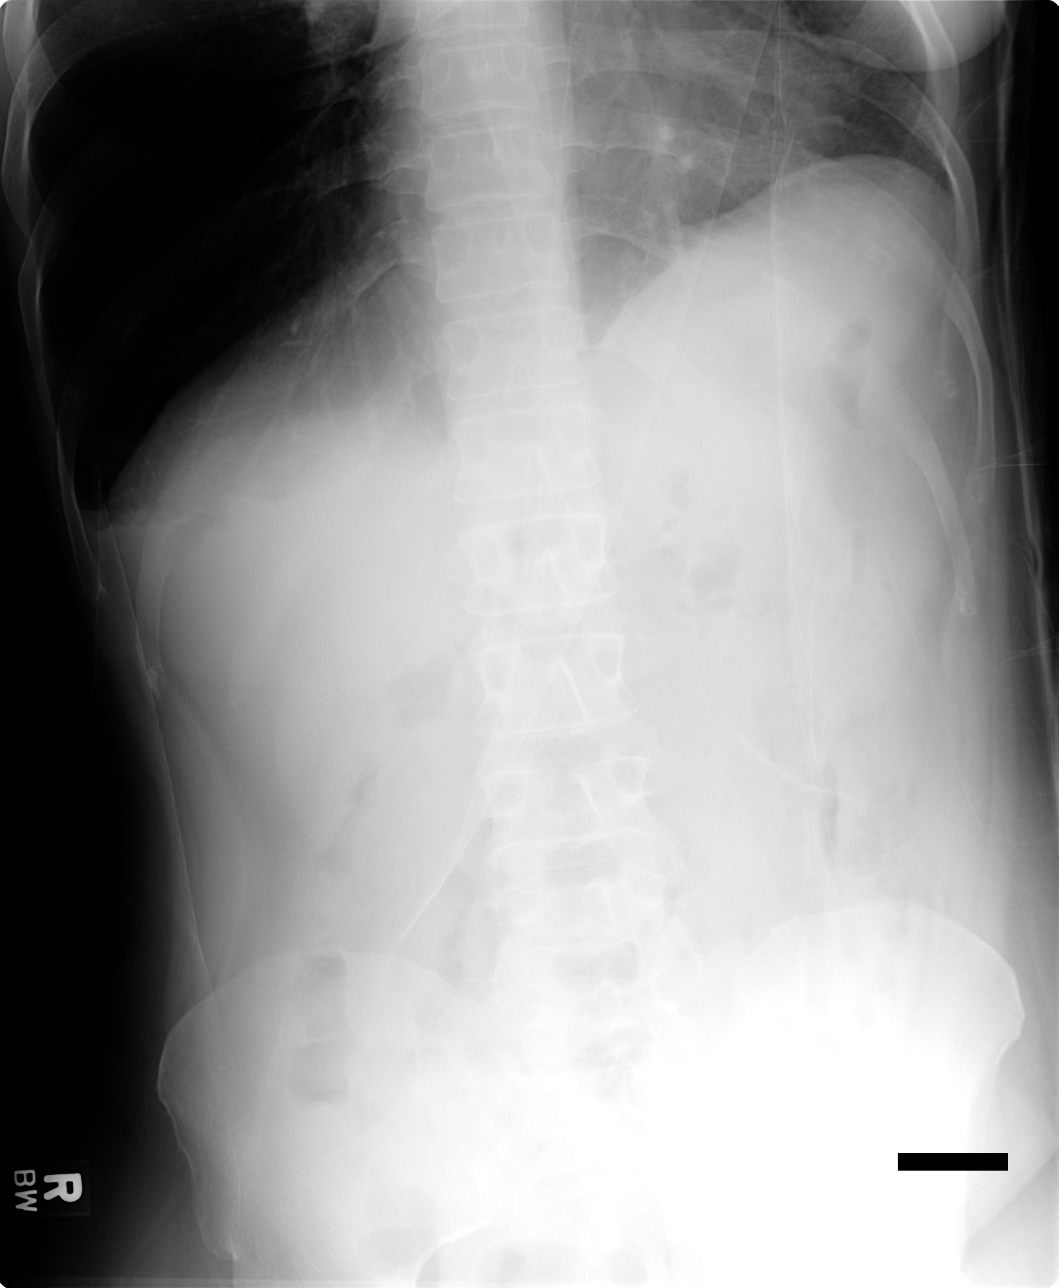

[3 of 3 positions shown; findings below may reference images not displayed]

Normal heart size, mediastinal contours, and vascularity.  Minimal chronic peribronchial thickening.  Lungs otherwise clear. 
Mild wall thickening is identified in the colon at the expected position of the hepatic flexure as well as distal transverse colon.  No signs of bowel obstruction or perforation.  Small bowel gas pattern normal.  Bones unremarkable.  No pathologic calcifications.  
IMPRESSION
Wall thickening identified in colon at expected positions of the transverse colon and hepatic flexure.  Findings may reflect colitis of any etiology including Crohn?s disease.  No evidence of perforation or obstruction.

## 2005-08-04 ENCOUNTER — Ambulatory Visit: Payer: Self-pay | Admitting: Pulmonary Disease

## 2005-08-14 ENCOUNTER — Ambulatory Visit: Payer: Self-pay | Admitting: Critical Care Medicine

## 2005-09-18 ENCOUNTER — Ambulatory Visit: Payer: Self-pay | Admitting: Internal Medicine

## 2005-09-18 ENCOUNTER — Inpatient Hospital Stay (HOSPITAL_COMMUNITY): Admission: EM | Admit: 2005-09-18 | Discharge: 2005-09-28 | Payer: Self-pay | Admitting: Emergency Medicine

## 2005-11-10 ENCOUNTER — Ambulatory Visit: Payer: Self-pay | Admitting: Critical Care Medicine

## 2005-11-11 ENCOUNTER — Ambulatory Visit (HOSPITAL_COMMUNITY): Admission: RE | Admit: 2005-11-11 | Discharge: 2005-11-11 | Payer: Self-pay | Admitting: Obstetrics and Gynecology

## 2005-11-16 IMAGING — CR DG CHEST 2V
2 series · 2 of 2 positions shown · non-contrast
Comparison: [DATE].

CLINICAL DATA: Follow-up pneumonia, shortness of breath and cough. 
 TWO VIEW CHEST ? 03/28/04:

[view not recorded (1 of 2)]
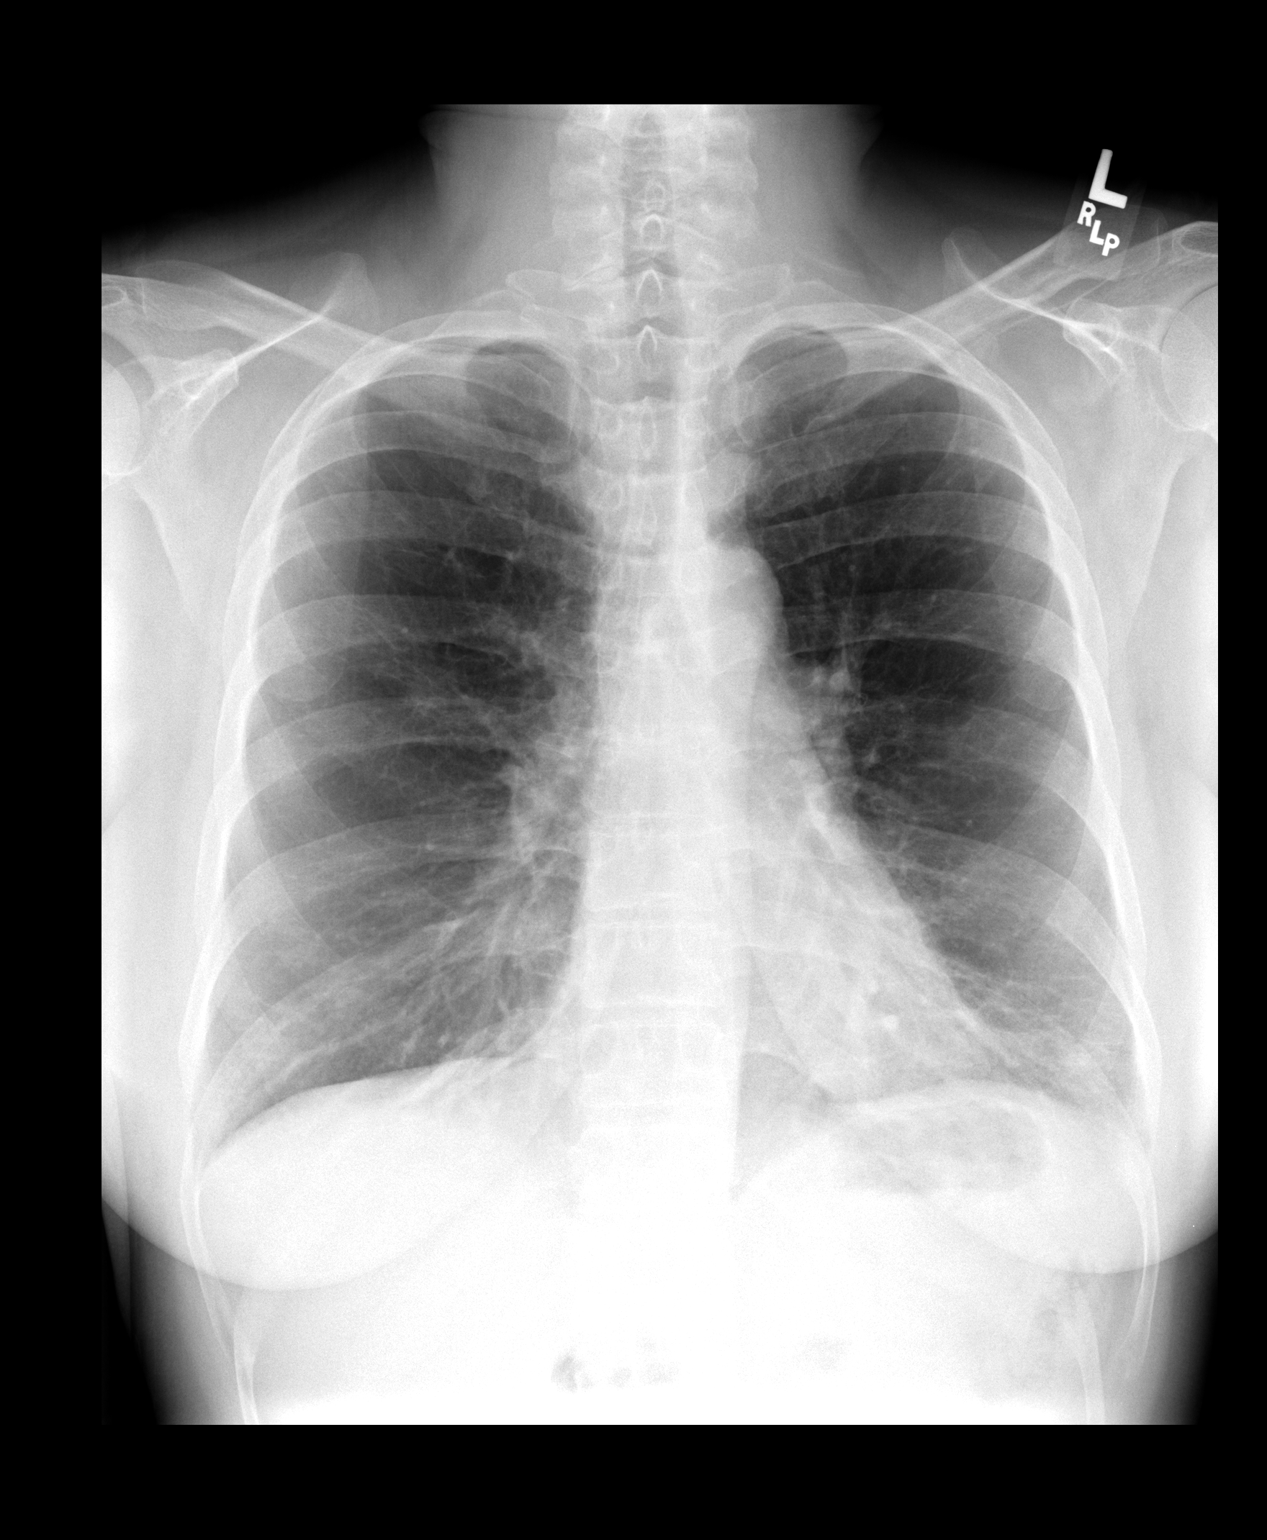

[view not recorded (2 of 2)]
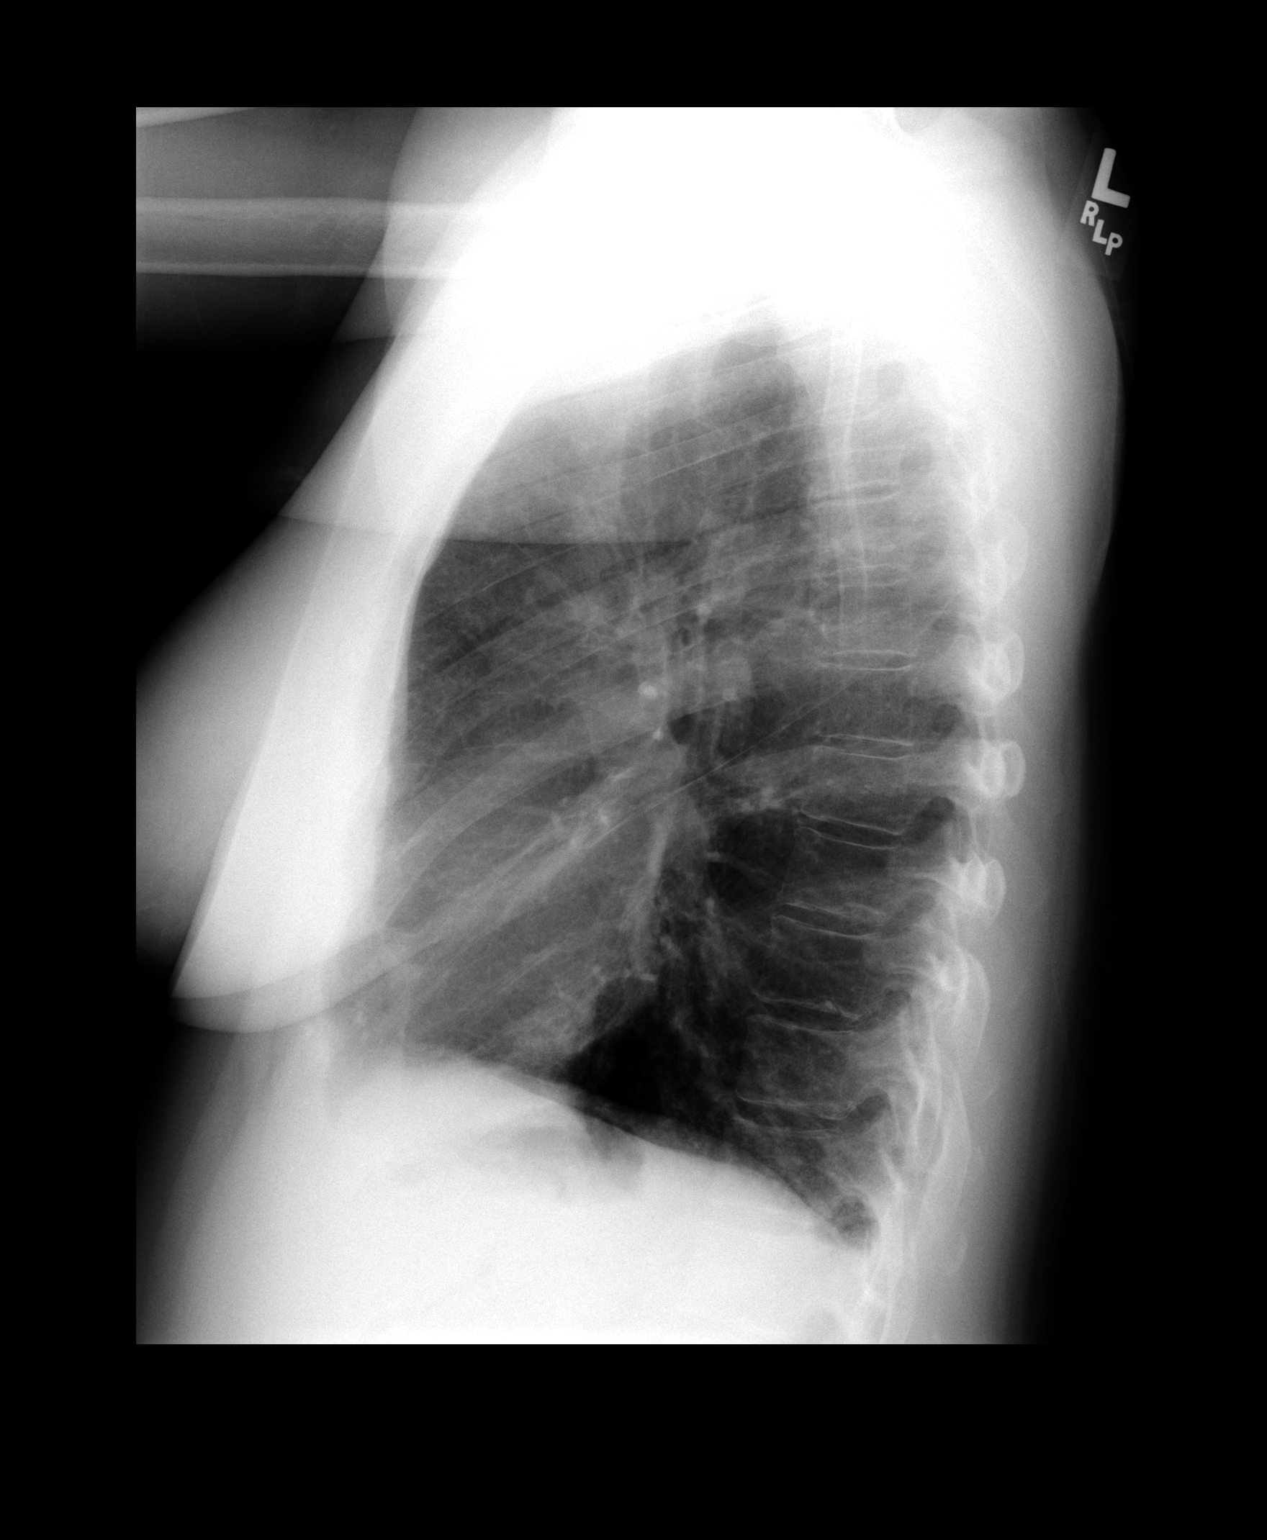

[2 of 2 positions shown; findings below may reference images not displayed]

FINDINGS: Two-view exam of the chest shows left base atelectasis.  The right lung is clear. The heart size is within normal limits.  Bony structures of the imaged thorax are intact.
IMPRESSION: Left base atelectasis.

## 2005-11-27 ENCOUNTER — Inpatient Hospital Stay (HOSPITAL_COMMUNITY): Admission: EM | Admit: 2005-11-27 | Discharge: 2006-01-02 | Payer: Self-pay | Admitting: Emergency Medicine

## 2005-12-09 ENCOUNTER — Encounter (INDEPENDENT_AMBULATORY_CARE_PROVIDER_SITE_OTHER): Payer: Self-pay | Admitting: Specialist

## 2005-12-11 IMAGING — CT CT CHEST W/ CM
1 of 2 series · 15 of 32 positions shown, 19 images · IV contrast (omnipaque)
Comparison: No prior chest CT. Two-view chest x-ray 04/01/2004 [REDACTED] is correlated.

CLINICAL DATA: Possible right lung nodule on recent chest x-ray.

CT CHEST WITH CONTRAST  04/22/2004:
TECHNIQUE: Multidetector CT imaging of the chest and upper abdomen was
performed during bolus administration of intravenous contrast.
Contrast:  100 cc Omnipaque 300.

[Series 2: chest routine 5.0 b30f · axial · 0.54mm/px · z∈[+627,+907]mm · 15 of 66 slices shown, 19 images]
[im 5/66  mediastinal]
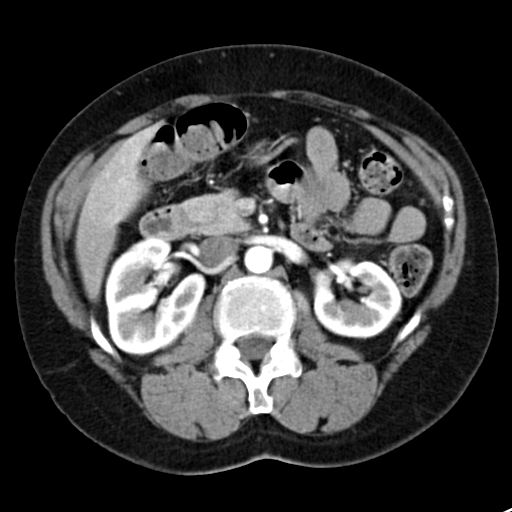
[im 5/66  lung]
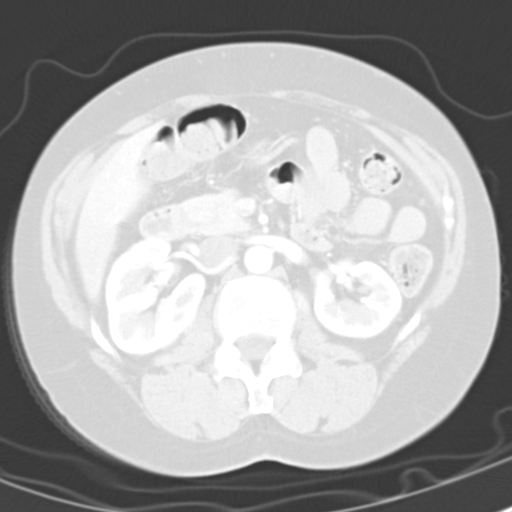
[im 9/66  lung]
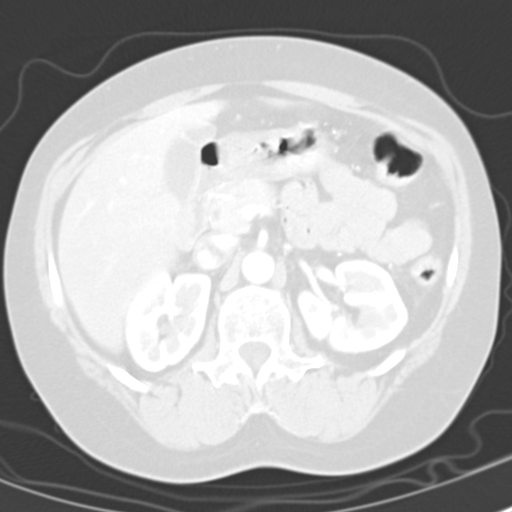
[im 14/66  lung]
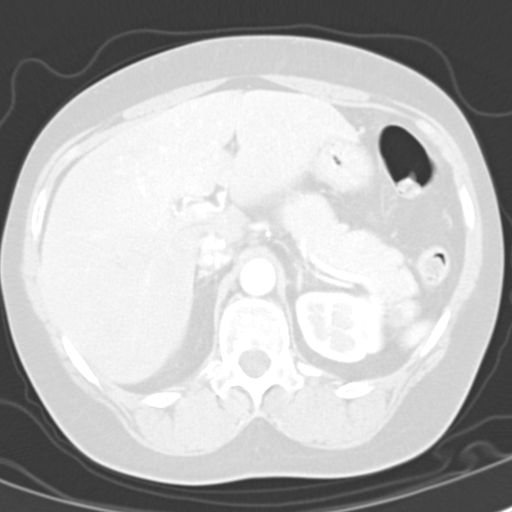
[im 18/66  lung]
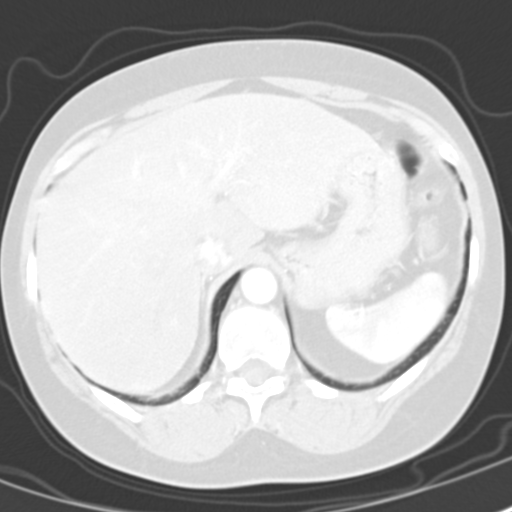
[im 22/66  mediastinal]
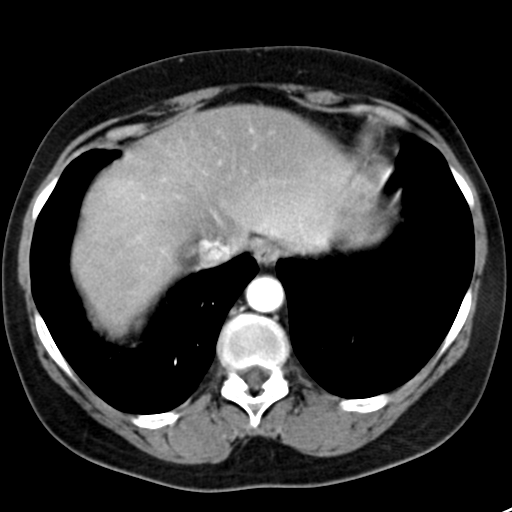
[im 22/66  lung]
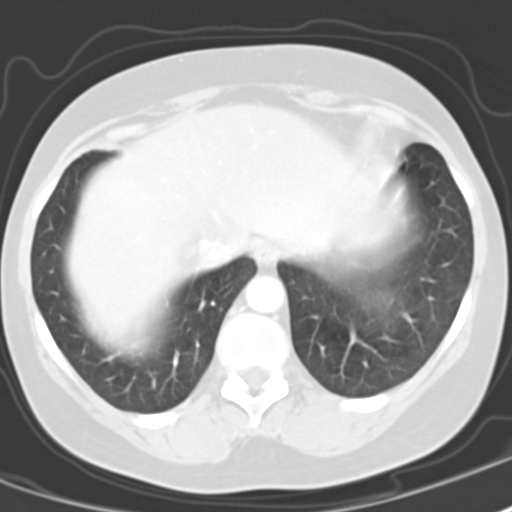
[im 27/66  lung]
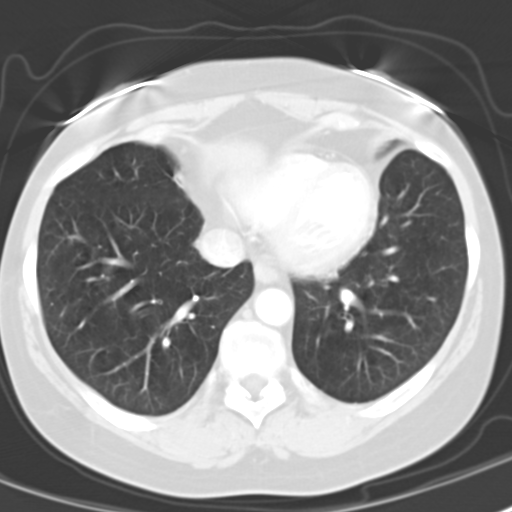
[im 31/66  lung]
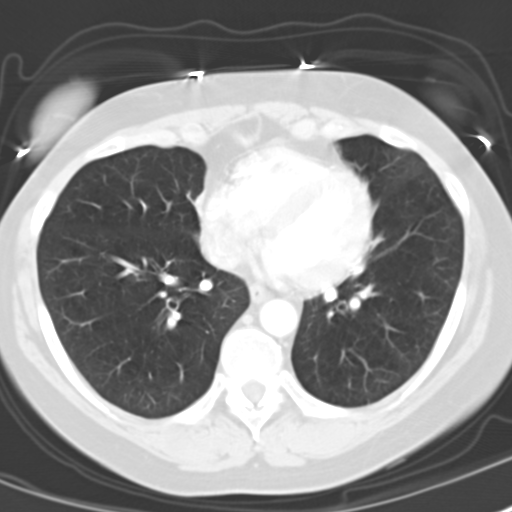
[im 33/66  lung]
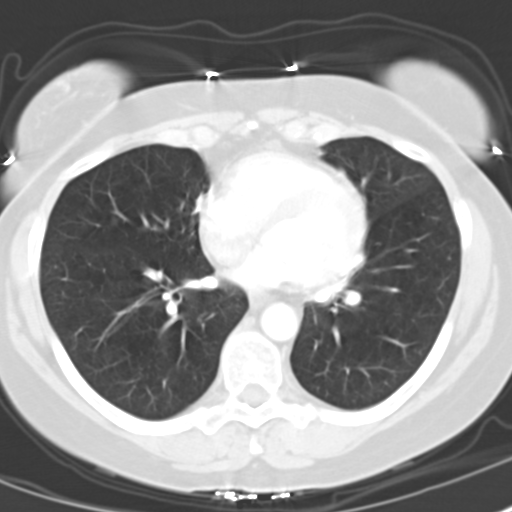
[im 35/66  mediastinal]
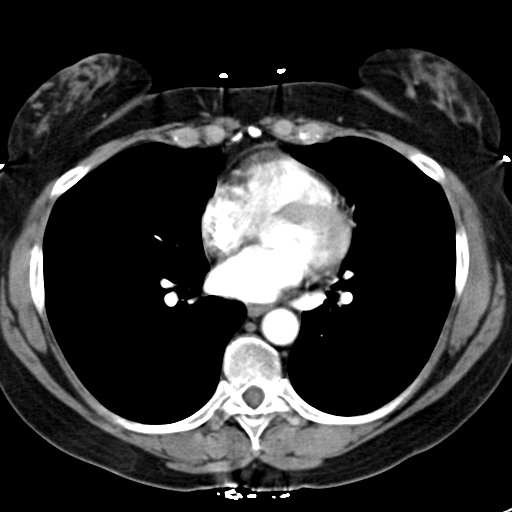
[im 35/66  lung]
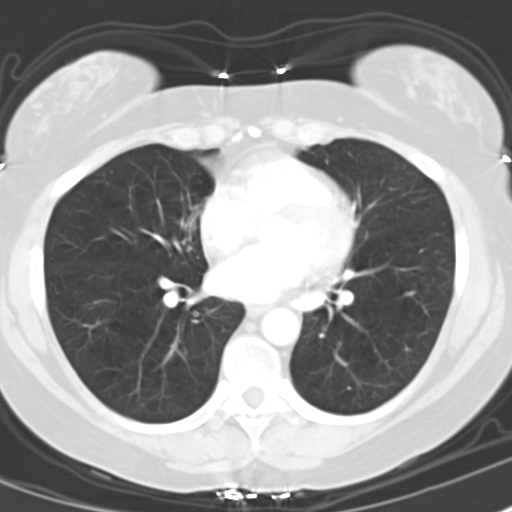
[im 40/66  lung]
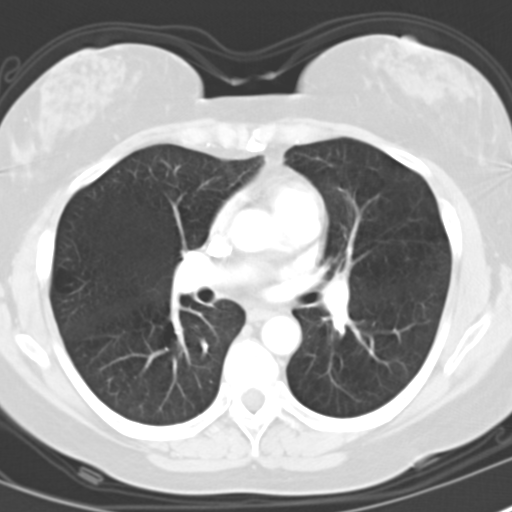
[im 44/66  lung]
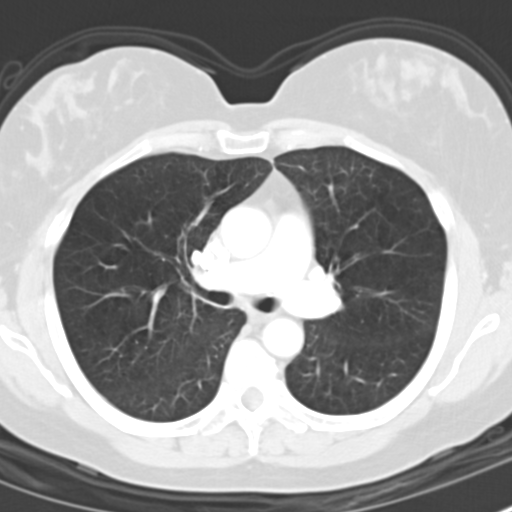
[im 48/66  lung]
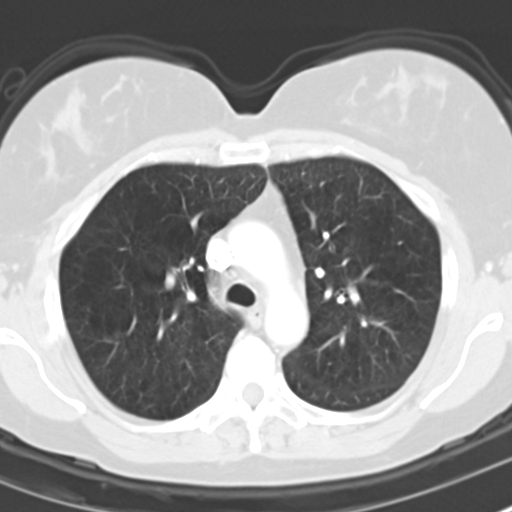
[im 53/66  mediastinal]
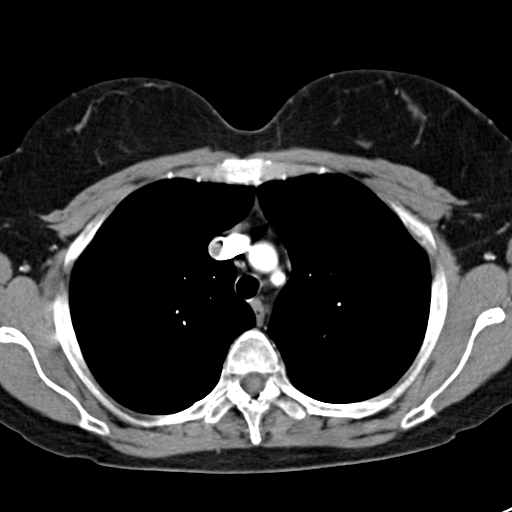
[im 53/66  lung]
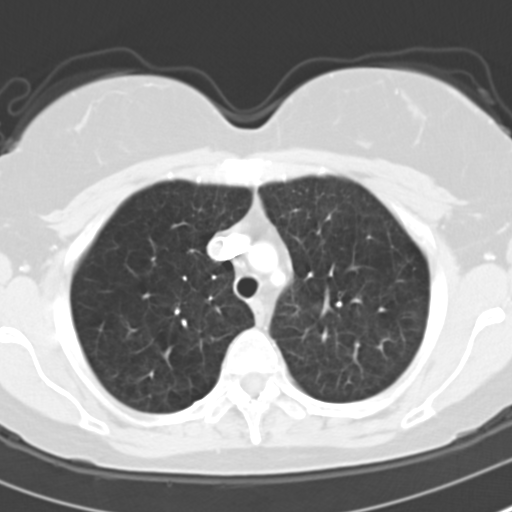
[im 57/66  lung]
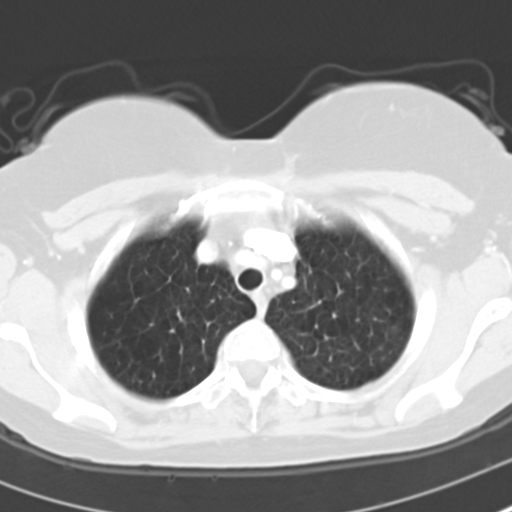
[im 61/66  lung]
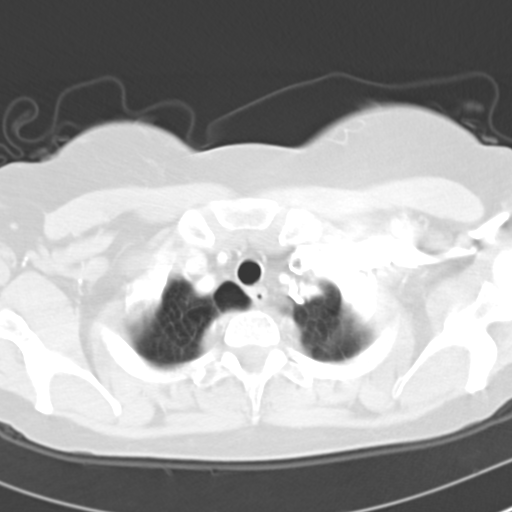

[15 of 32 positions shown; findings below may reference images not displayed]

FINDINGS: There is an approximate 1.2 cm spiculated nodule in the right upper
lobe. This is very near the minor fissure. No other pulmonary parenchymal
nodules are identified in either lung. Emphysema is present, particularly in the
upper lobes. There are no pleural effusions. There is no evidence of airspace
disease.

The heart size is normal. There is no significant lymphadenopathy within the
mediastinum or in either hilum. There is no axillary lymphadenopathy.

The visualized upper abdomen is unremarkable for the early arterial phase of
enhancement.
IMPRESSION: 1. Solitary 1.2 cm nodule in the inferior right upper lobe.

2. Emphysema. No acute cardiopulmonary disease otherwise. 

3. No significant lymphadenopathy in the chest or upper abdomen.

## 2005-12-17 ENCOUNTER — Encounter: Payer: Self-pay | Admitting: Vascular Surgery

## 2006-04-01 DIAGNOSIS — J449 Chronic obstructive pulmonary disease, unspecified: Secondary | ICD-10-CM

## 2006-04-24 ENCOUNTER — Emergency Department (HOSPITAL_COMMUNITY): Admission: EM | Admit: 2006-04-24 | Discharge: 2006-04-24 | Payer: Self-pay | Admitting: Emergency Medicine

## 2006-04-28 ENCOUNTER — Other Ambulatory Visit: Admission: RE | Admit: 2006-04-28 | Discharge: 2006-04-28 | Payer: Self-pay | Admitting: Obstetrics and Gynecology

## 2006-05-06 ENCOUNTER — Encounter: Admission: RE | Admit: 2006-05-06 | Discharge: 2006-05-06 | Payer: Self-pay | Admitting: Gastroenterology

## 2006-05-08 ENCOUNTER — Ambulatory Visit (HOSPITAL_COMMUNITY): Admission: RE | Admit: 2006-05-08 | Discharge: 2006-05-08 | Payer: Self-pay | Admitting: Gastroenterology

## 2006-05-08 ENCOUNTER — Encounter (INDEPENDENT_AMBULATORY_CARE_PROVIDER_SITE_OTHER): Payer: Self-pay | Admitting: *Deleted

## 2006-05-21 ENCOUNTER — Ambulatory Visit: Payer: Self-pay | Admitting: Critical Care Medicine

## 2006-06-25 ENCOUNTER — Ambulatory Visit (HOSPITAL_COMMUNITY): Admission: RE | Admit: 2006-06-25 | Discharge: 2006-06-25 | Payer: Self-pay | Admitting: Gastroenterology

## 2006-06-30 ENCOUNTER — Ambulatory Visit (HOSPITAL_COMMUNITY): Admission: RE | Admit: 2006-06-30 | Discharge: 2006-06-30 | Payer: Self-pay | Admitting: Obstetrics and Gynecology

## 2006-07-07 ENCOUNTER — Ambulatory Visit: Payer: Self-pay | Admitting: Critical Care Medicine

## 2006-07-18 ENCOUNTER — Emergency Department (HOSPITAL_COMMUNITY): Admission: EM | Admit: 2006-07-18 | Discharge: 2006-07-18 | Payer: Self-pay | Admitting: Emergency Medicine

## 2006-09-21 HISTORY — PX: NM MYOCAR PERF WALL MOTION: HXRAD629

## 2006-12-02 ENCOUNTER — Ambulatory Visit: Payer: Self-pay | Admitting: Critical Care Medicine

## 2006-12-23 ENCOUNTER — Emergency Department (HOSPITAL_COMMUNITY): Admission: EM | Admit: 2006-12-23 | Discharge: 2006-12-23 | Payer: Self-pay | Admitting: Emergency Medicine

## 2007-01-07 ENCOUNTER — Ambulatory Visit (HOSPITAL_COMMUNITY): Admission: RE | Admit: 2007-01-07 | Discharge: 2007-01-07 | Payer: Self-pay | Admitting: Gastroenterology

## 2007-01-27 ENCOUNTER — Ambulatory Visit: Payer: Self-pay | Admitting: Critical Care Medicine

## 2007-01-27 DIAGNOSIS — J383 Other diseases of vocal cords: Secondary | ICD-10-CM

## 2007-01-27 DIAGNOSIS — M810 Age-related osteoporosis without current pathological fracture: Secondary | ICD-10-CM | POA: Insufficient documentation

## 2007-01-27 DIAGNOSIS — K5090 Crohn's disease, unspecified, without complications: Secondary | ICD-10-CM | POA: Insufficient documentation

## 2007-01-27 DIAGNOSIS — J439 Emphysema, unspecified: Secondary | ICD-10-CM

## 2007-01-27 DIAGNOSIS — K219 Gastro-esophageal reflux disease without esophagitis: Secondary | ICD-10-CM | POA: Insufficient documentation

## 2007-01-27 DIAGNOSIS — E785 Hyperlipidemia, unspecified: Secondary | ICD-10-CM

## 2007-02-24 ENCOUNTER — Ambulatory Visit: Payer: Self-pay | Admitting: Pulmonary Disease

## 2007-04-12 ENCOUNTER — Ambulatory Visit: Payer: Self-pay | Admitting: Critical Care Medicine

## 2007-04-25 ENCOUNTER — Emergency Department (HOSPITAL_COMMUNITY): Admission: EM | Admit: 2007-04-25 | Discharge: 2007-04-25 | Payer: Self-pay | Admitting: *Deleted

## 2007-04-26 ENCOUNTER — Ambulatory Visit: Payer: Self-pay | Admitting: Critical Care Medicine

## 2007-05-03 ENCOUNTER — Telehealth: Payer: Self-pay | Admitting: Critical Care Medicine

## 2007-05-11 ENCOUNTER — Telehealth (INDEPENDENT_AMBULATORY_CARE_PROVIDER_SITE_OTHER): Payer: Self-pay | Admitting: *Deleted

## 2007-06-02 ENCOUNTER — Ambulatory Visit: Payer: Self-pay | Admitting: Critical Care Medicine

## 2007-06-02 DIAGNOSIS — F329 Major depressive disorder, single episode, unspecified: Secondary | ICD-10-CM

## 2007-07-02 ENCOUNTER — Ambulatory Visit (HOSPITAL_COMMUNITY): Admission: RE | Admit: 2007-07-02 | Discharge: 2007-07-02 | Payer: Self-pay | Admitting: Obstetrics and Gynecology

## 2007-07-07 ENCOUNTER — Encounter: Admission: RE | Admit: 2007-07-07 | Discharge: 2007-07-07 | Payer: Self-pay | Admitting: Cardiology

## 2007-07-08 ENCOUNTER — Encounter: Payer: Self-pay | Admitting: Critical Care Medicine

## 2007-07-08 HISTORY — PX: CARDIAC CATHETERIZATION: SHX172

## 2007-07-18 IMAGING — CR DG CHEST 1V PORT
1 series · 1 of 1 positions shown · non-contrast
Comparison: none

HISTORY: Dyspnea, GI bleed, question free air

[view not recorded]
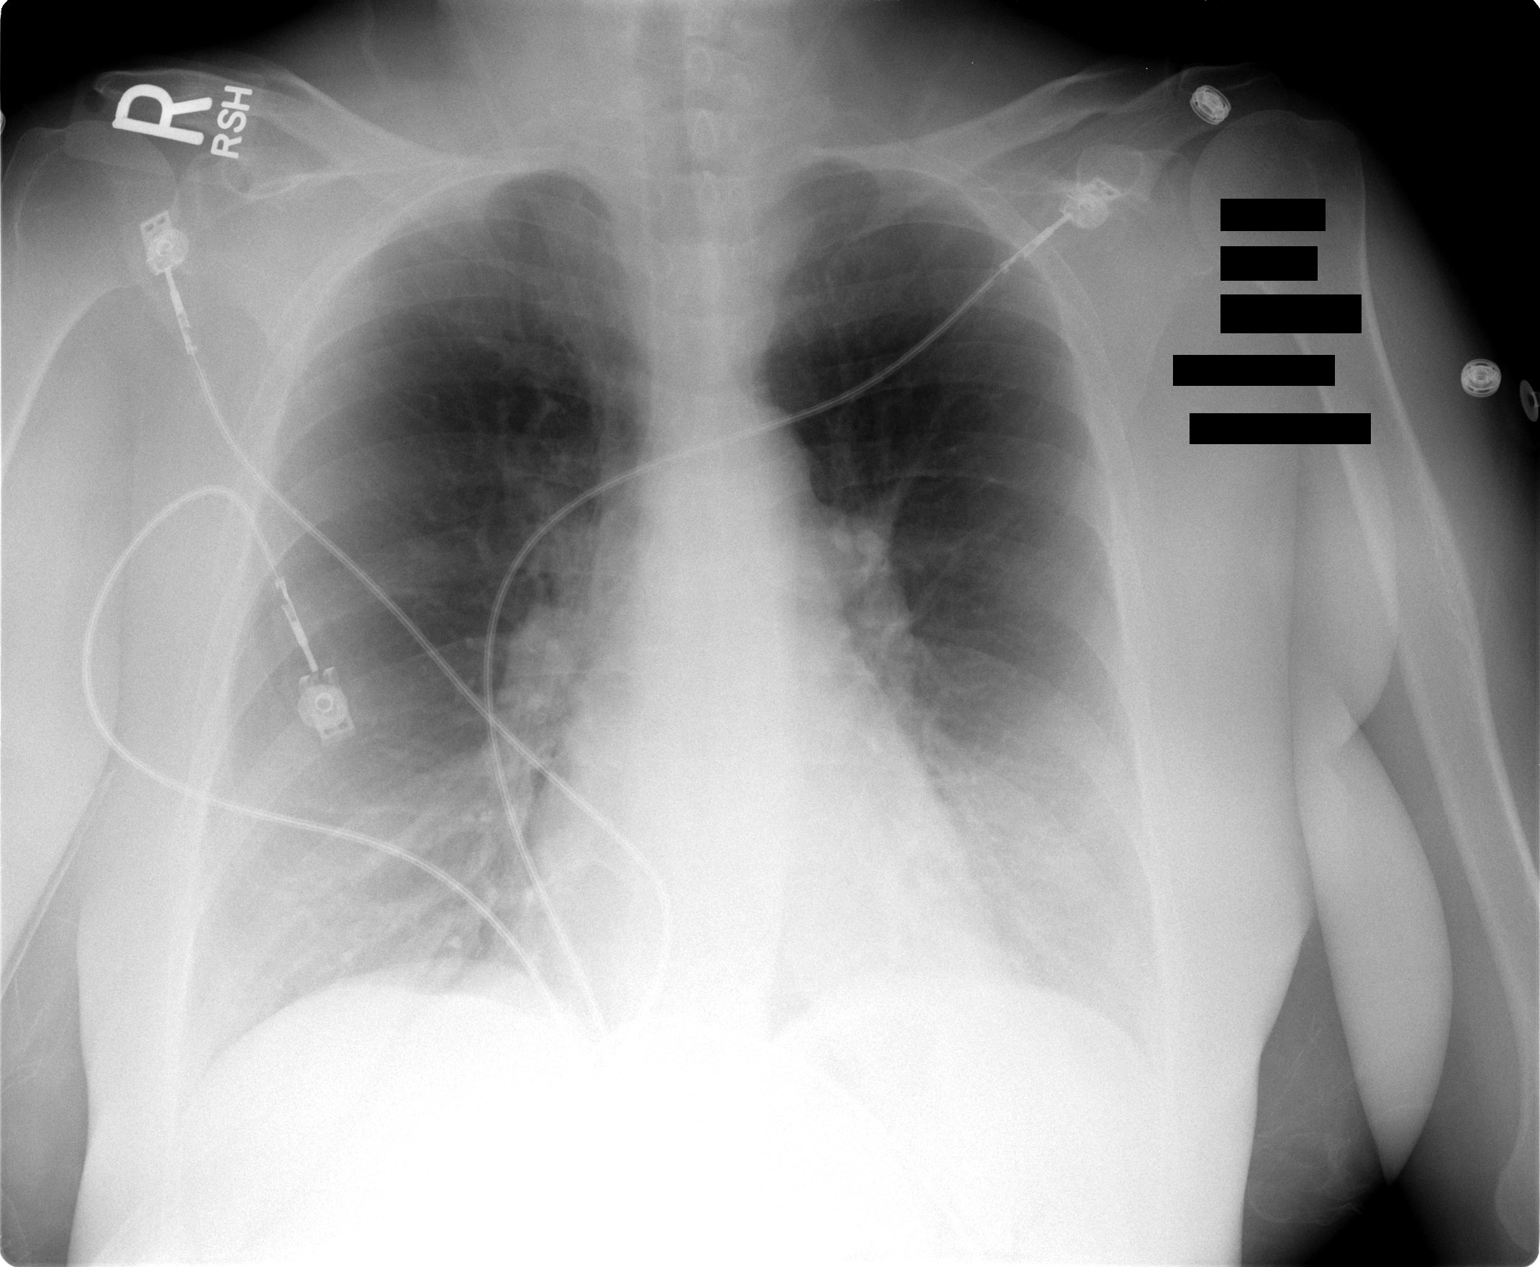

[1 of 1 positions shown; findings below may reference images not displayed]

PORTABLE CHEST ONE VIEW:

Portable exam 6276 hours compared to 04/01/2004

Normal heart size, mediastinal contours, and pulmonary vascularity.
Right upper lobe nodular density seen on previous exam not definitely visualized
on current study.
No acute infiltrate or effusion.
No gross free intraperitoneal air seen under either diaphragm.
IMPRESSION: No acute abnormalities.

## 2007-07-31 IMAGING — CT CT PELVIS W/ CM
2 of 5 series · 16 of 46 positions shown, 18 images · IV contrast (omnipaque)
Comparison: 09/18/05.

CLINICAL DATA: History of Crohn?s ileocolitis with exacerbation and bloody stools.
 ABDOMEN CT WITH CONTRAST:
TECHNIQUE: Multidetector CT imaging of the abdomen was performed following the standard protocol during bolus administration of intravenous contrast.
 Contrast:  100 cc Omnipaque 300
TECHNIQUE: Multidetector CT imaging of the pelvis was performed following the standard protocol during bolus administration of intravenous contrast.

[Series 2: abd/pelv with 5.0 b31f st · axial · 0.54mm/px · z∈[-622,-262]mm · 13 of 82 slices shown, 15 images]
[im 5/82  soft-tissue]
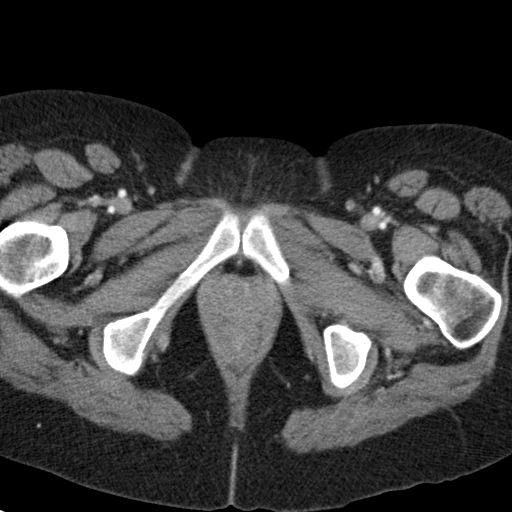
[im 5/82  bone]
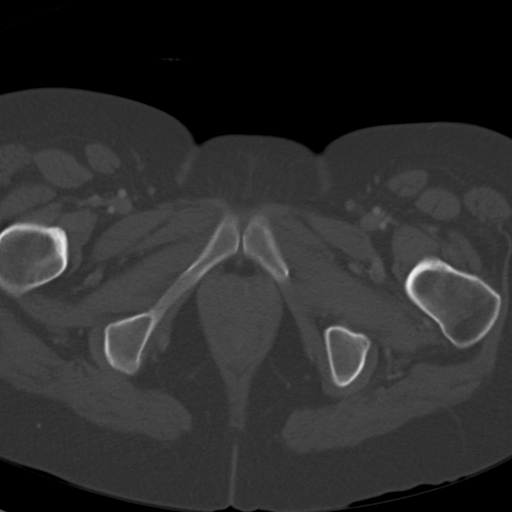
[im 13/82  soft-tissue]
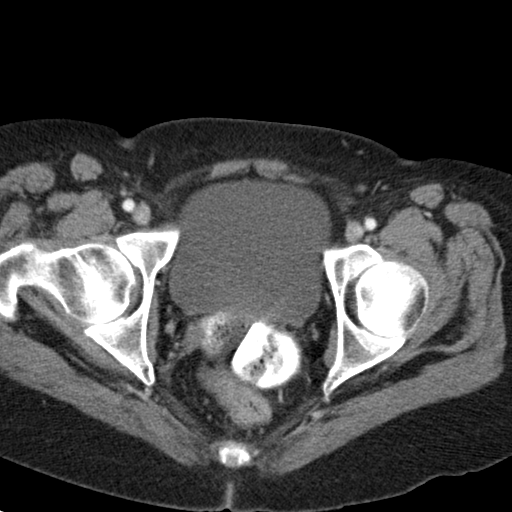
[im 17/82  soft-tissue]
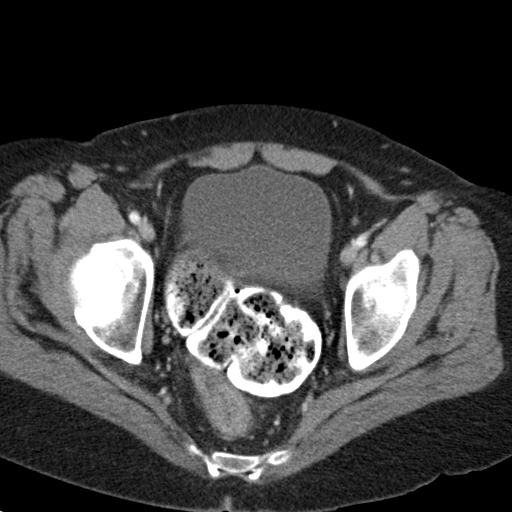
[im 25/82  soft-tissue]
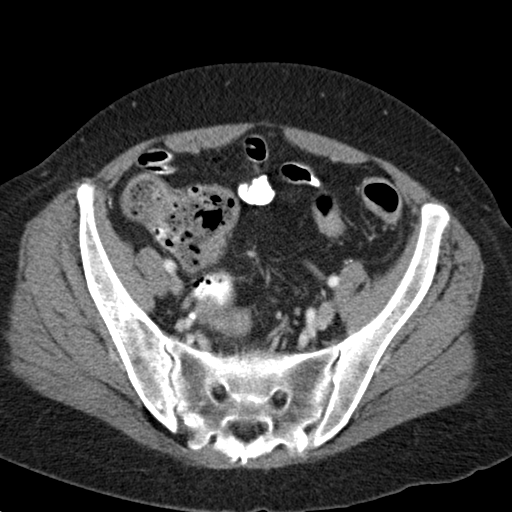
[im 29/82  soft-tissue]
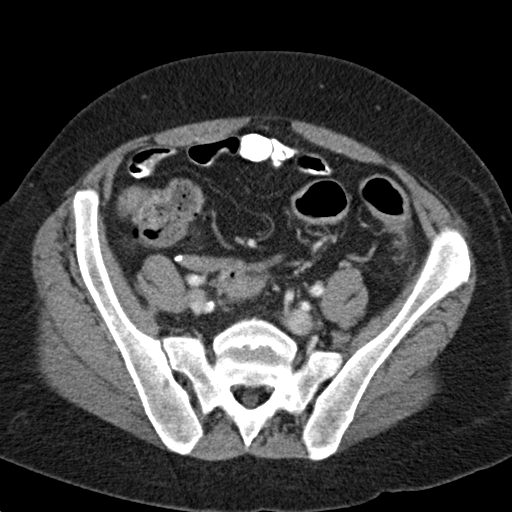
[im 37/82  soft-tissue]
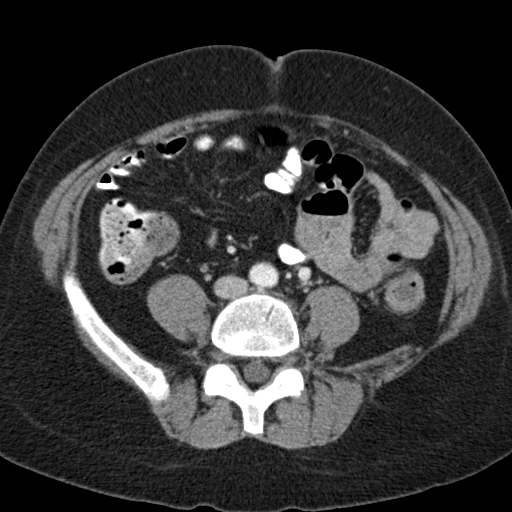
[im 41/82  soft-tissue]
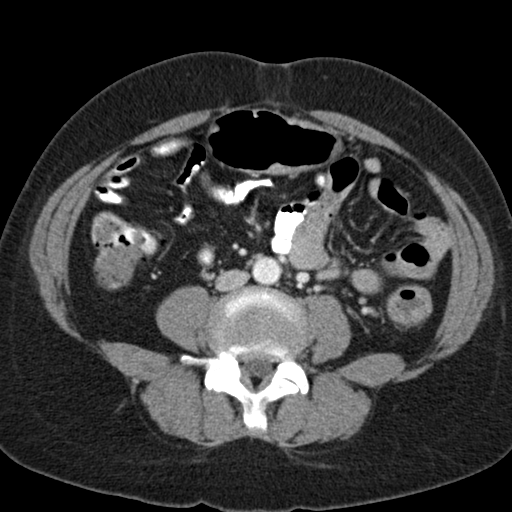
[im 45/82  soft-tissue]
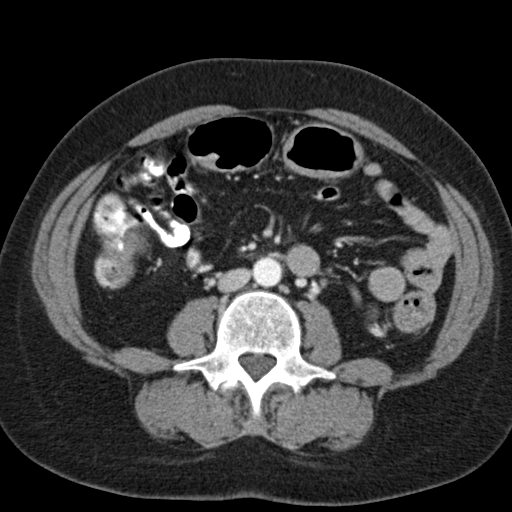
[im 53/82  soft-tissue]
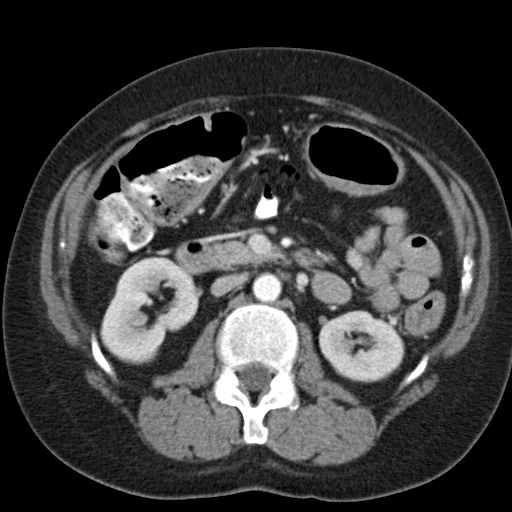
[im 53/82  bone]
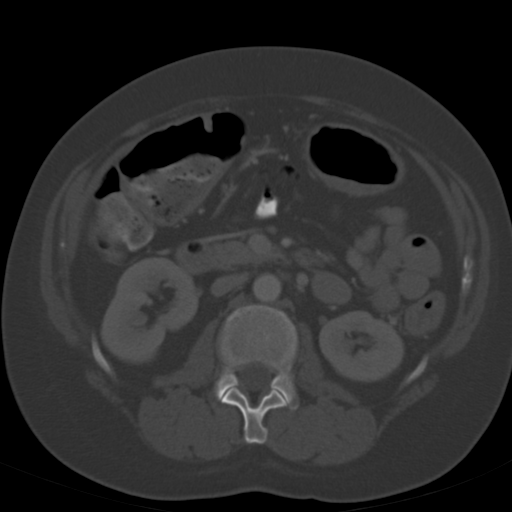
[im 57/82  soft-tissue]
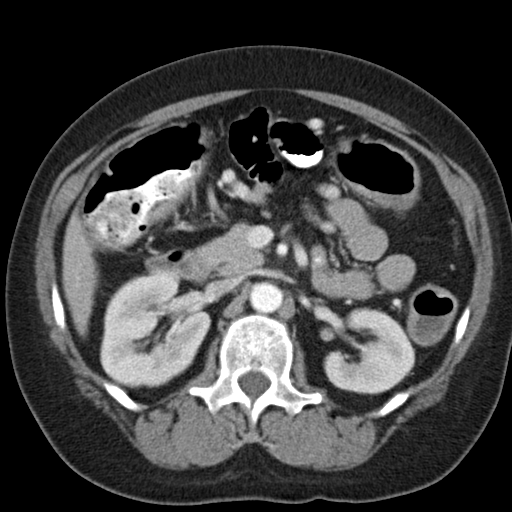
[im 65/82  soft-tissue]
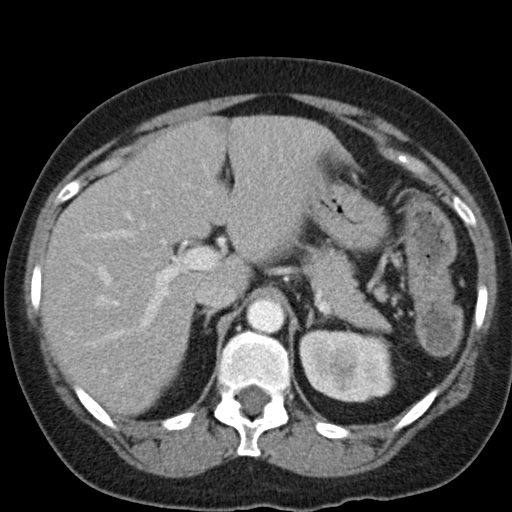
[im 69/82  soft-tissue]
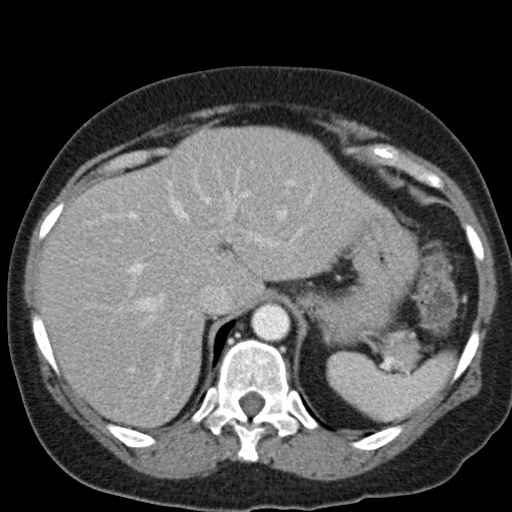
[im 77/82  soft-tissue]
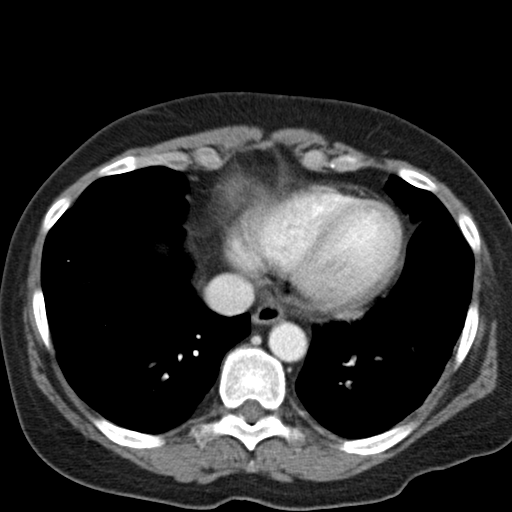

[Series 603: cor a/p · coronal · 0.80mm/px · 3 of 103 slices shown]
[im 35/103  soft-tissue]
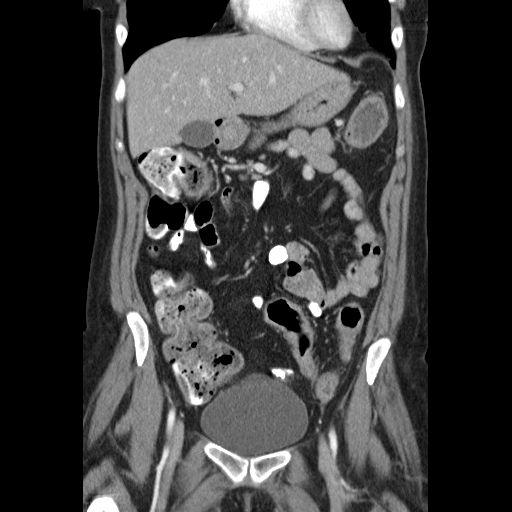
[im 46/103  soft-tissue]
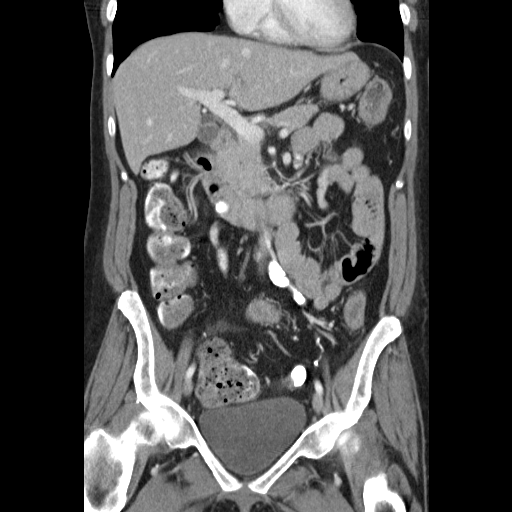
[im 57/103  soft-tissue]
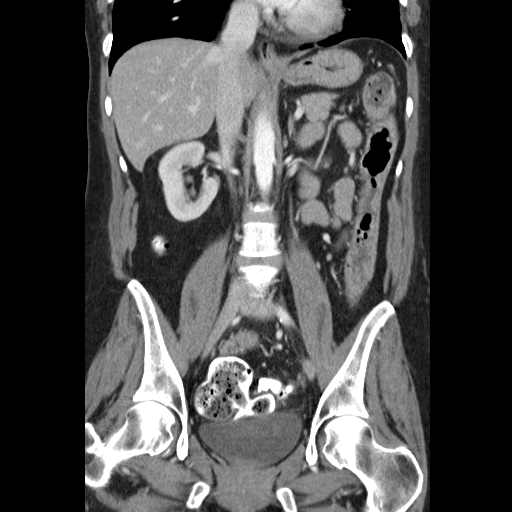

[16 of 46 positions shown; findings below may reference images not displayed]

FINDINGS: The lung bases are clear. 
 There is mild diffuse fatty infiltration of the liver.  No focal hepatic lesions or intrahepatic ductal dilatation.  More pronounced focal fatty change near the falciform ligament.  The spleen is normal in size.  The pancreas, adrenal glands and kidneys demonstrate no significant abnormalities.  The aorta is normal in caliber. No dissection.  Major branch vessels are normal.  The portal and splenic veins are patent.  The stomach is not well distended with contrast, but no gross abnormalities are seen.  The duodenum, small bowel and colon are unremarkable.  There is some mild wall thickening involving the splenic flexure region of the colon, but no pericolonic inflammatory change.  No mesenteric or retroperitoneal masses or adenopathy.  Small duodenal diverticulum is again noted.  Bony structures are intact.
IMPRESSION: 1. Mild wall thickening in the region of the splenic flexure.  
 2. No pericolonic inflammatory change.  The small bowel is unremarkable.  
 3. The remainder of the abdomen is unremarkable.  
 PELVIS CT WITH CONTRAST:
FINDINGS: The rectum, sigmoid colon, visualized small bowel loops are unremarkable.  The terminal ileum is normal in appearance.  The patient has a low-lying cecum deep in the pelvis.  The bladder is unremarkable.  No pelvic masses, adenopathy or free pelvic fluid collections.
IMPRESSION: Unremarkable CT examination of the pelvis.  The terminal ileum is normal in appearance.  No significant sigmoid colon inflammatory change.

## 2007-07-31 IMAGING — CR DG CHEST 1V PORT
1 series · 1 of 1 positions shown · non-contrast
Comparison: 11/27/05.

CLINICAL DATA: 54-year-old with abdominal pain, Crohn exacerbation.  PICC line placement.  
 PORTABLE CHEST - 1 VIEW 12/10/05:

[view not recorded]
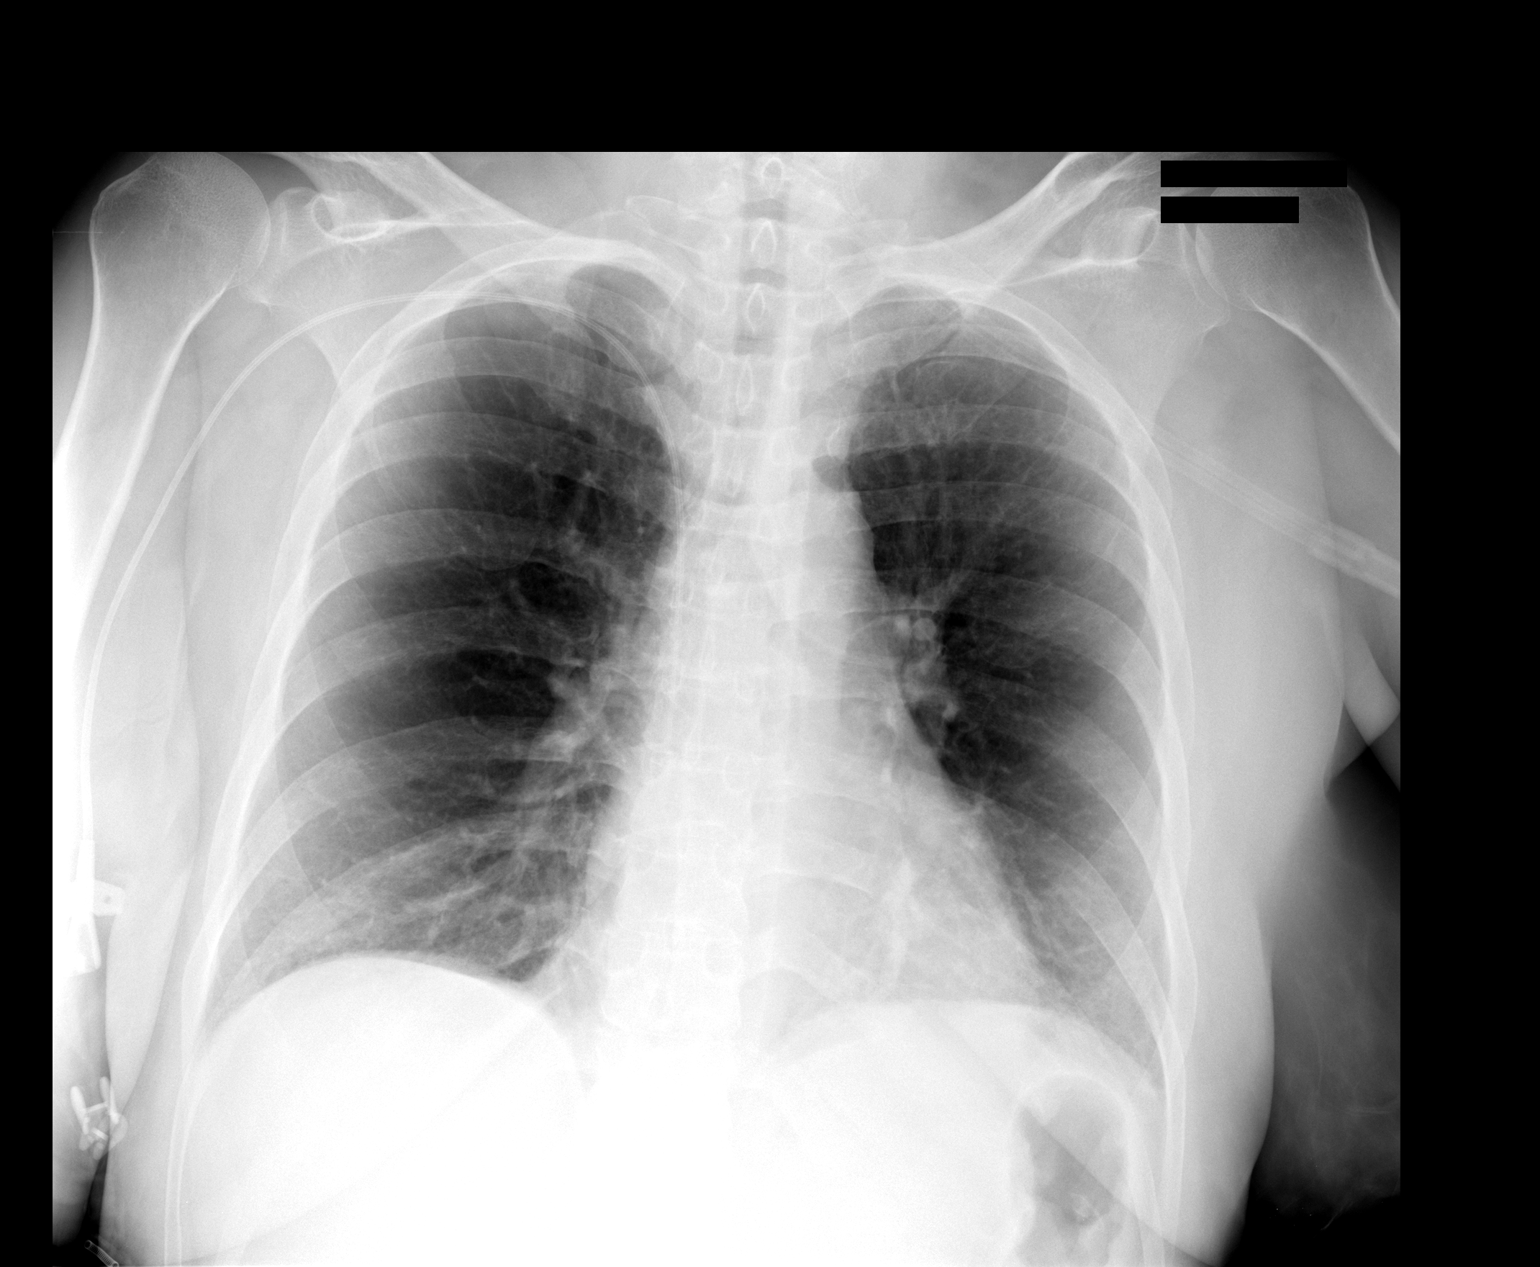

[1 of 1 positions shown; findings below may reference images not displayed]

FINDINGS: Right PICC line tip is in the distal SVC.  Cardiac silhouette, mediastinal and hilar contours are stable.  Lungs are clear of acute process.  Streaky basilar scarring or atelectasis.
IMPRESSION: PICC line tip in the distal SVC.

## 2007-08-06 IMAGING — CR DG ABDOMEN ACUTE W/ 1V CHEST
3 series · 3 of 3 positions shown · non-contrast
Comparison: CT dated 12/10/05.

CLINICAL DATA: Crohn?s exacerbation. 
 ACUTE ABDOMINAL SERIES:

[view not recorded (1 of 3)]
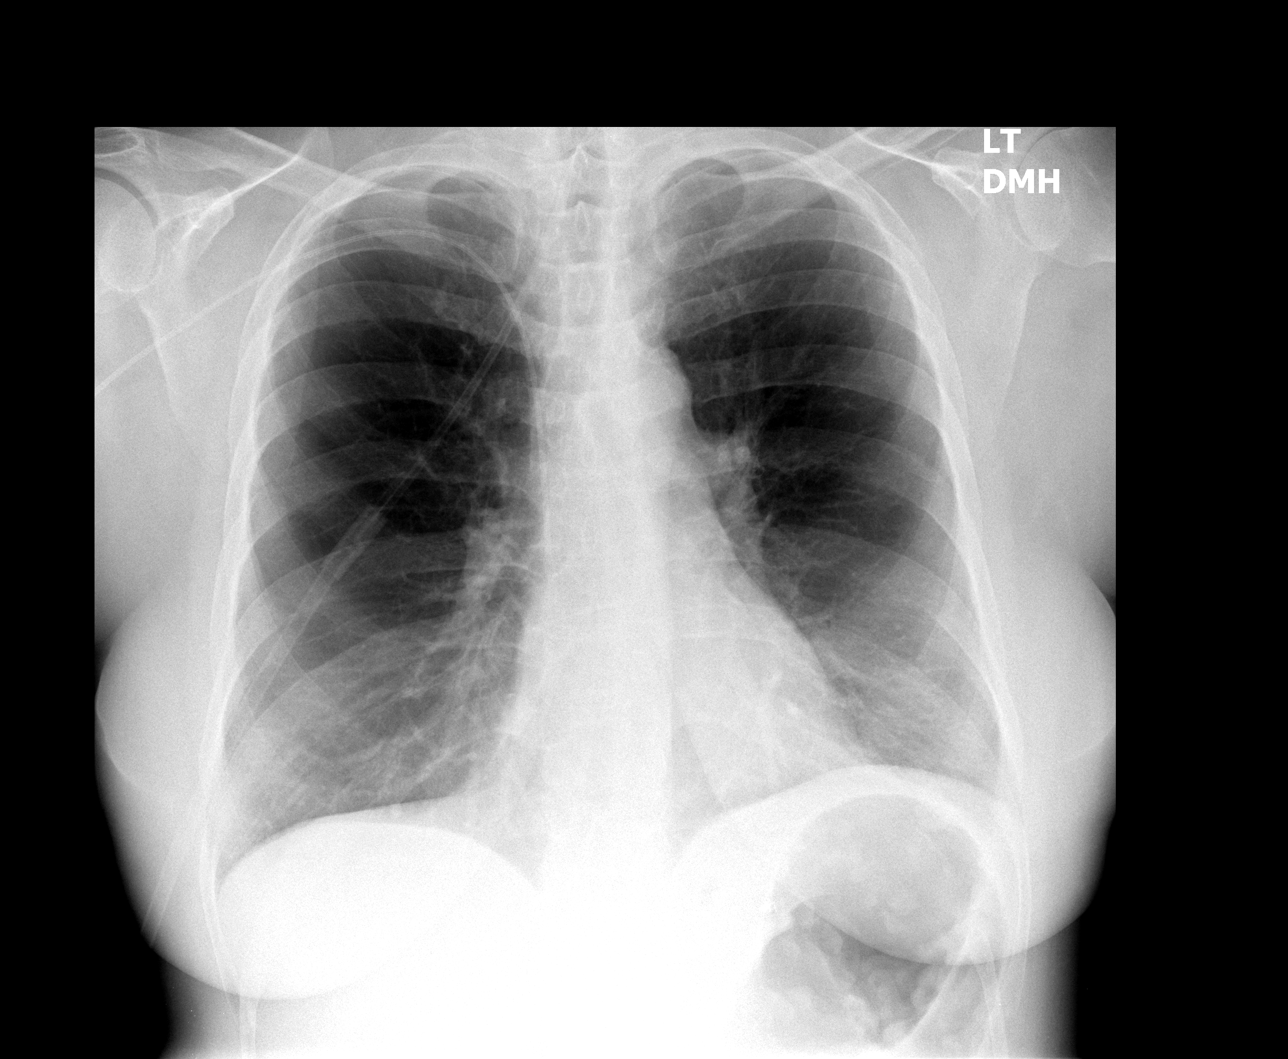

[view not recorded (2 of 3)]
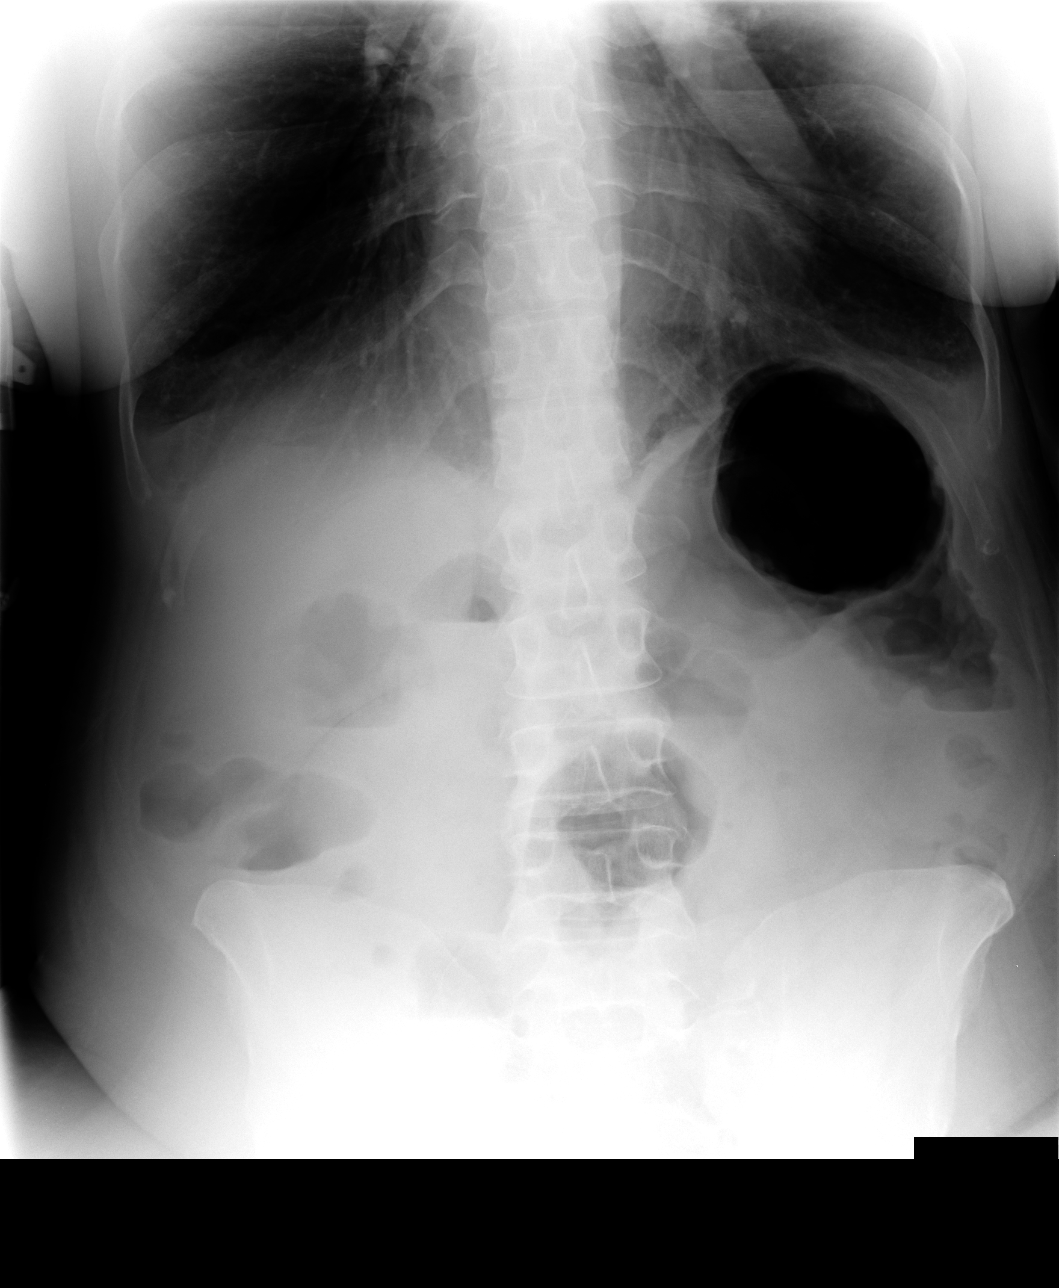

[view not recorded (3 of 3)]
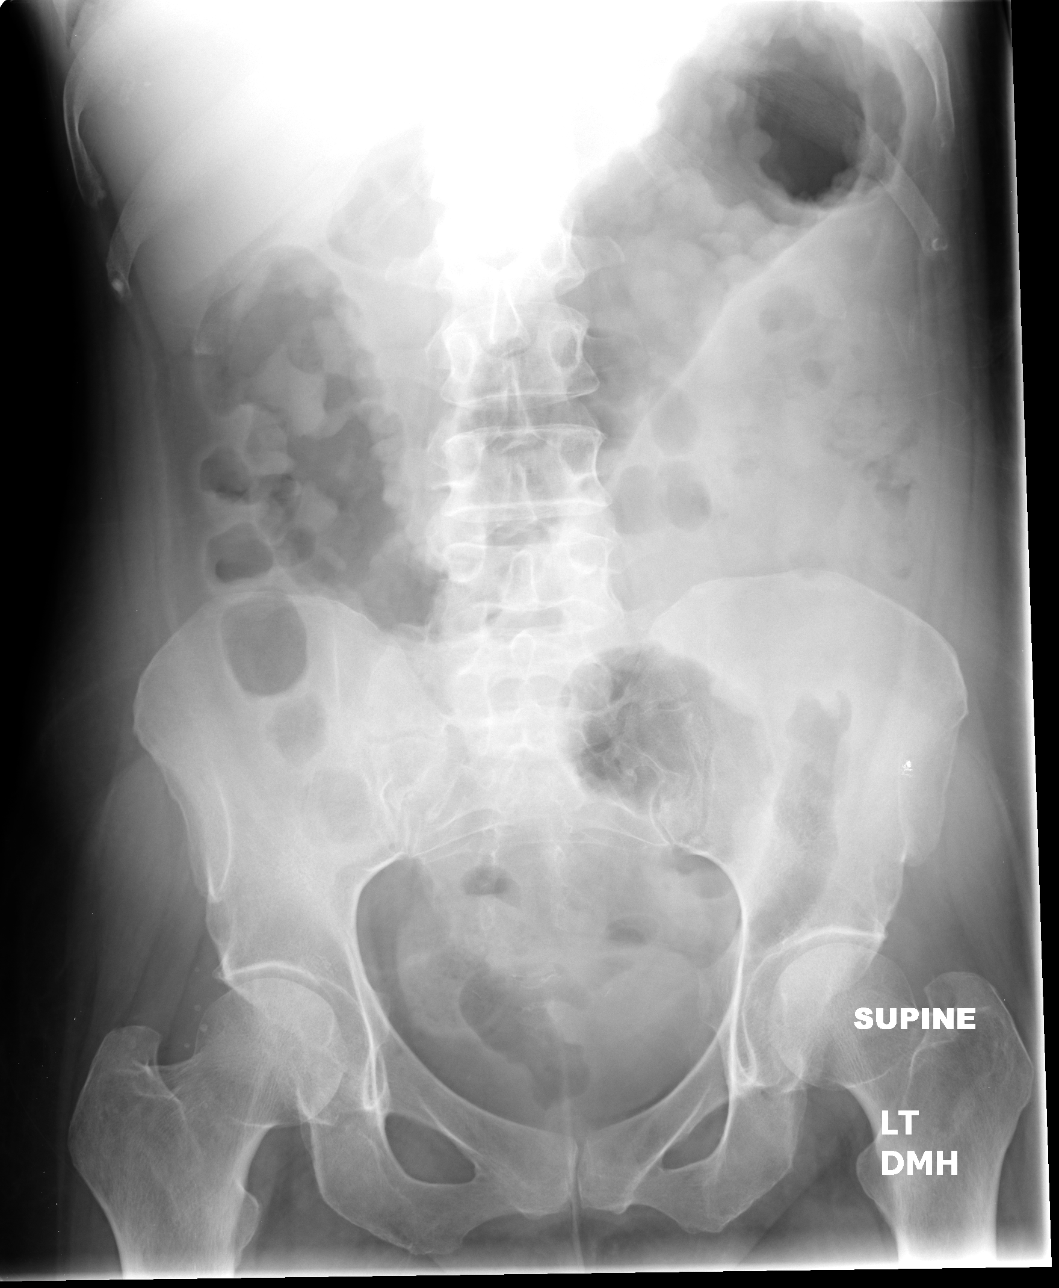

[3 of 3 positions shown; findings below may reference images not displayed]

FINDINGS: There is a right arm PICC line with tip in the projection of the SVC.  Heart size is normal.  There are no effusions or edema.  No focal airspace opacities.  There is abnormal dilatation of the transverse colon which appears somewhat edematous.  The appearance suggests Crohn?s involvement.  There is no evidence for free air.  No abnormally dilated loops of small bowel.
IMPRESSION: 1.  No active cardiopulmonary disease.
 2.  Atypical appearance of the transverse colon which is increased in caliber with a somewhat irregular fold pattern suggesting Crohn?s involvement.

## 2007-08-08 IMAGING — CR DG ABDOMEN 1V
1 series · 1 of 1 positions shown · non-contrast
Comparison: 12/16/2005.

CLINICAL DATA: Abdominal pain, Crohn's exacerbation, monitor transverse colon
diameter.

ABDOMEN - 1 VIEW

[t abdomen supine]
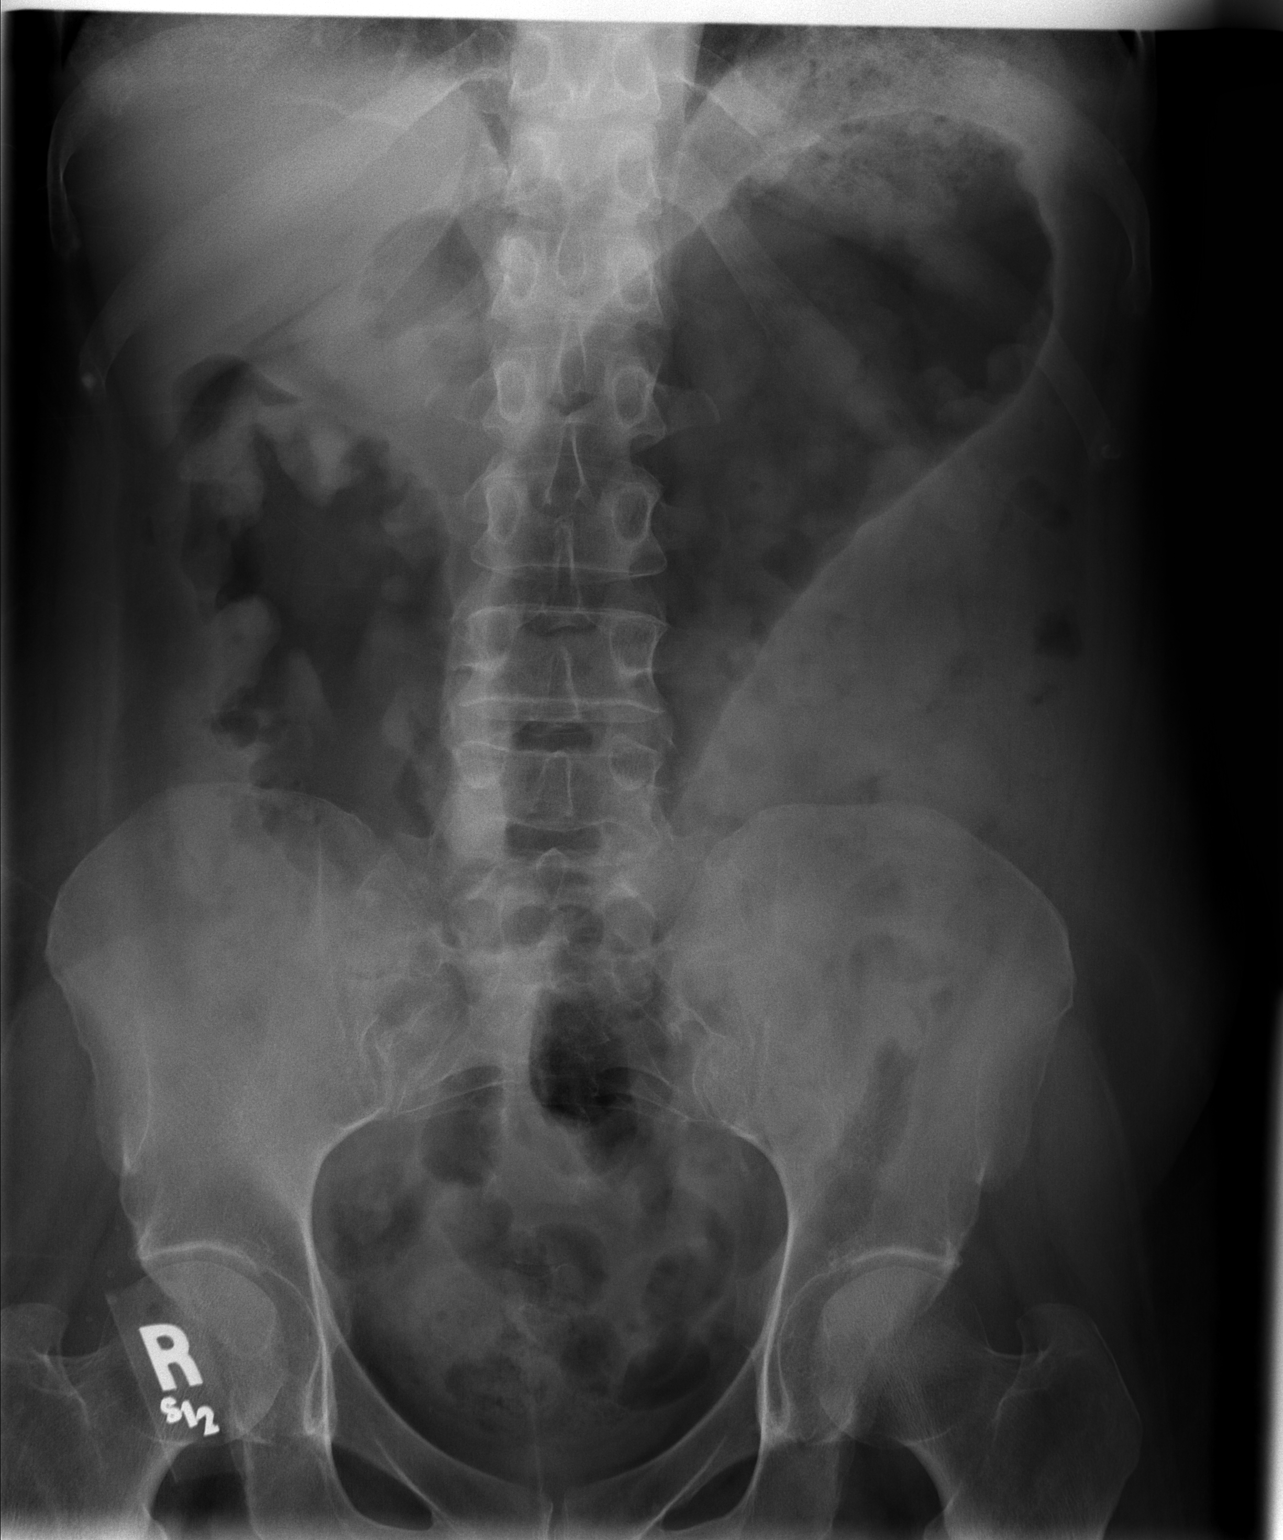

[1 of 1 positions shown; findings below may reference images not displayed]

FINDINGS: Again noted is gaseous distention of transverse colon. This has
worsened since prior study. The transverse colon now measures maximally 9.4 cm
in diameter, compared to 6.5 cm previously. Evidence of wall thickening again
noted. No free air.
IMPRESSION: Worsening transverse colonic distention now 9.4 cm, with continued evidence of
wall thickening.

## 2007-08-10 IMAGING — CR DG ABDOMEN 1V
1 series · 1 of 1 positions shown · non-contrast
Comparison: none

CLINICAL DATA: Abdominal pain with diarrhea, fever and nausea.  Crohn?s disease.    
 ABDOMEN ? 1 VIEW:

[t abdomen supine]
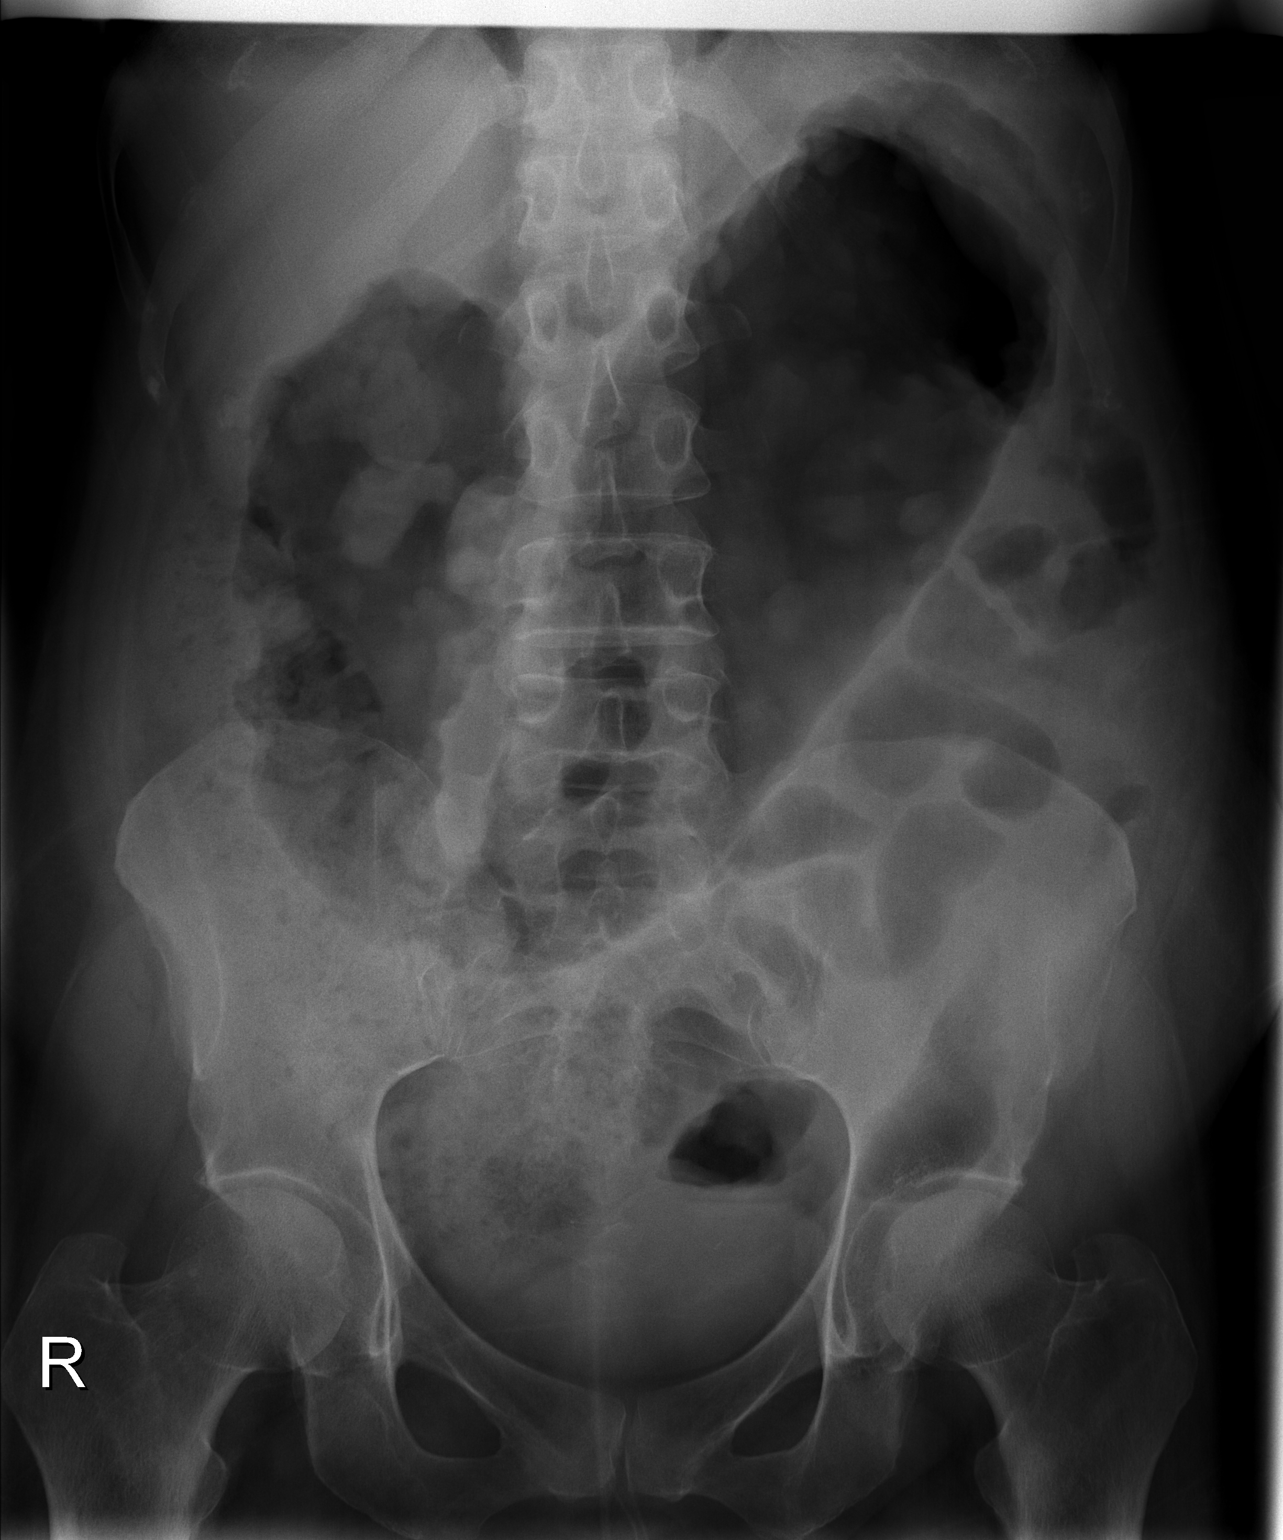

[1 of 1 positions shown; findings below may reference images not displayed]

FINDINGS: There is diffuse distention of the transverse colon.  Maximum diameter is now 10.4 cm, slightly increased since the prior study.  The diffuse nodularity of the mucosa is again noted essentially unchanged.  There is some stool in the ascending colon.  Descending colon is not distended.
IMPRESSION: Slight increased gaseous distention of the transverse colon.

## 2007-08-11 IMAGING — CR DG ABDOMEN ACUTE W/ 1V CHEST
3 series · 3 of 3 positions shown · non-contrast
Comparison: Abdomen obtained yesterday and chest obtained on 12/10/2005.

CLINICAL DATA: Abdominal pain and distention. History of Crohn's disease.

ABDOMEN SERIES - 2 VIEW & CHEST - 1 VIEW

[w chest pa]
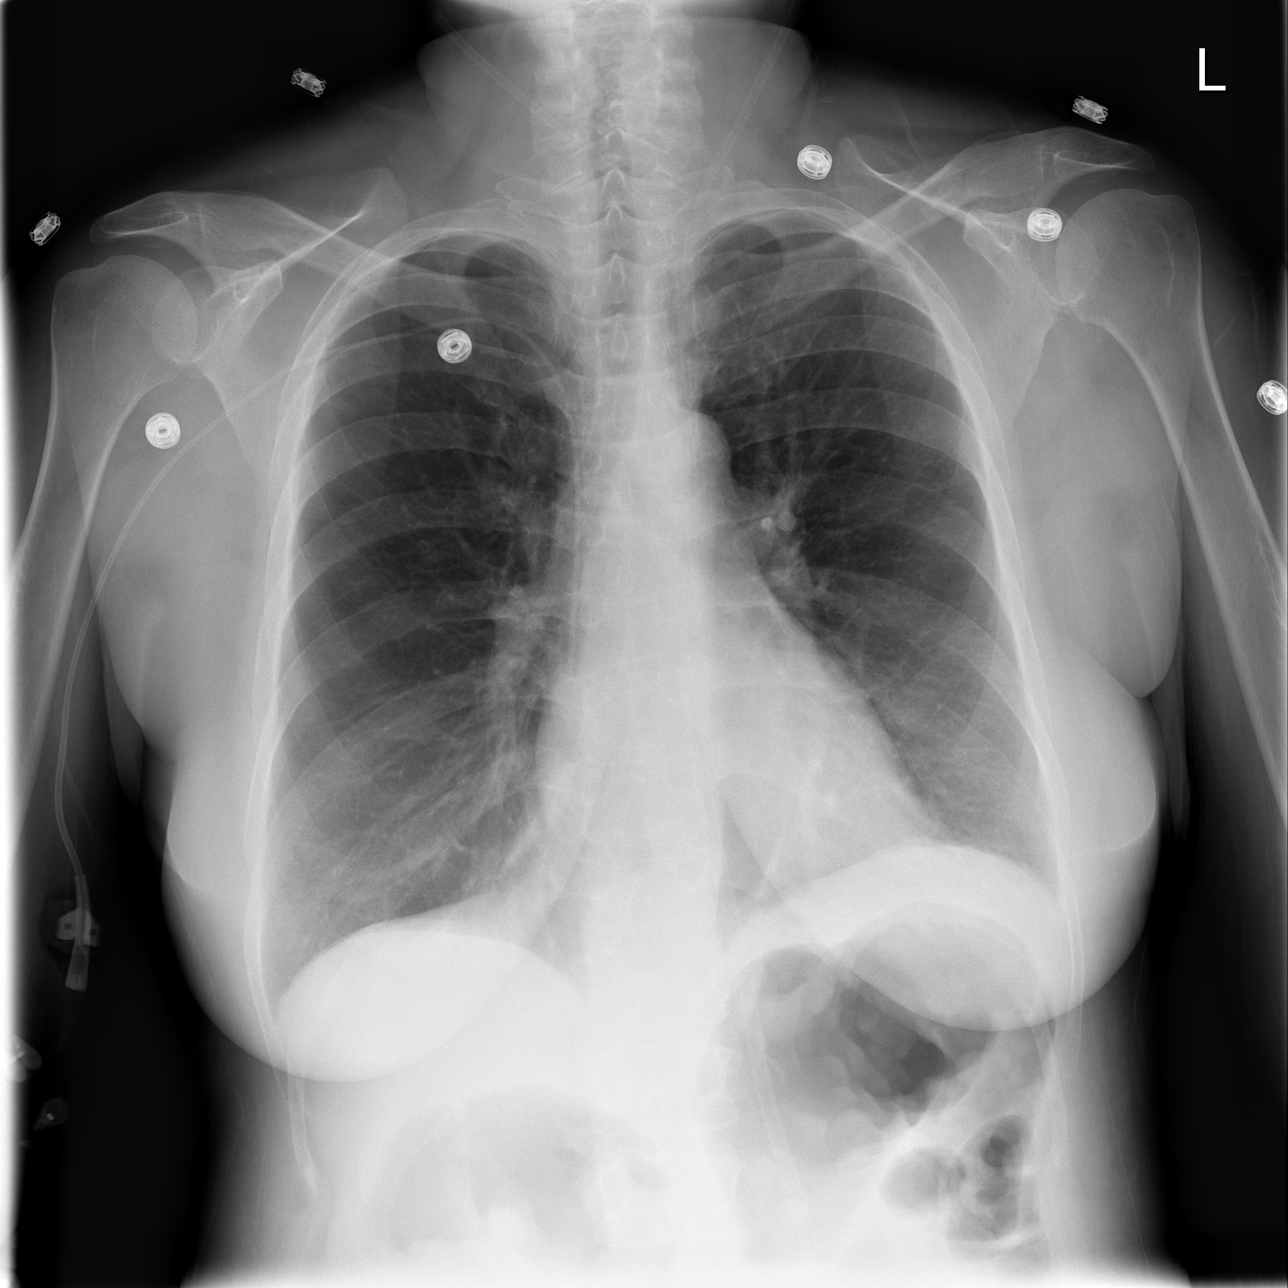

[w abdomen upright]
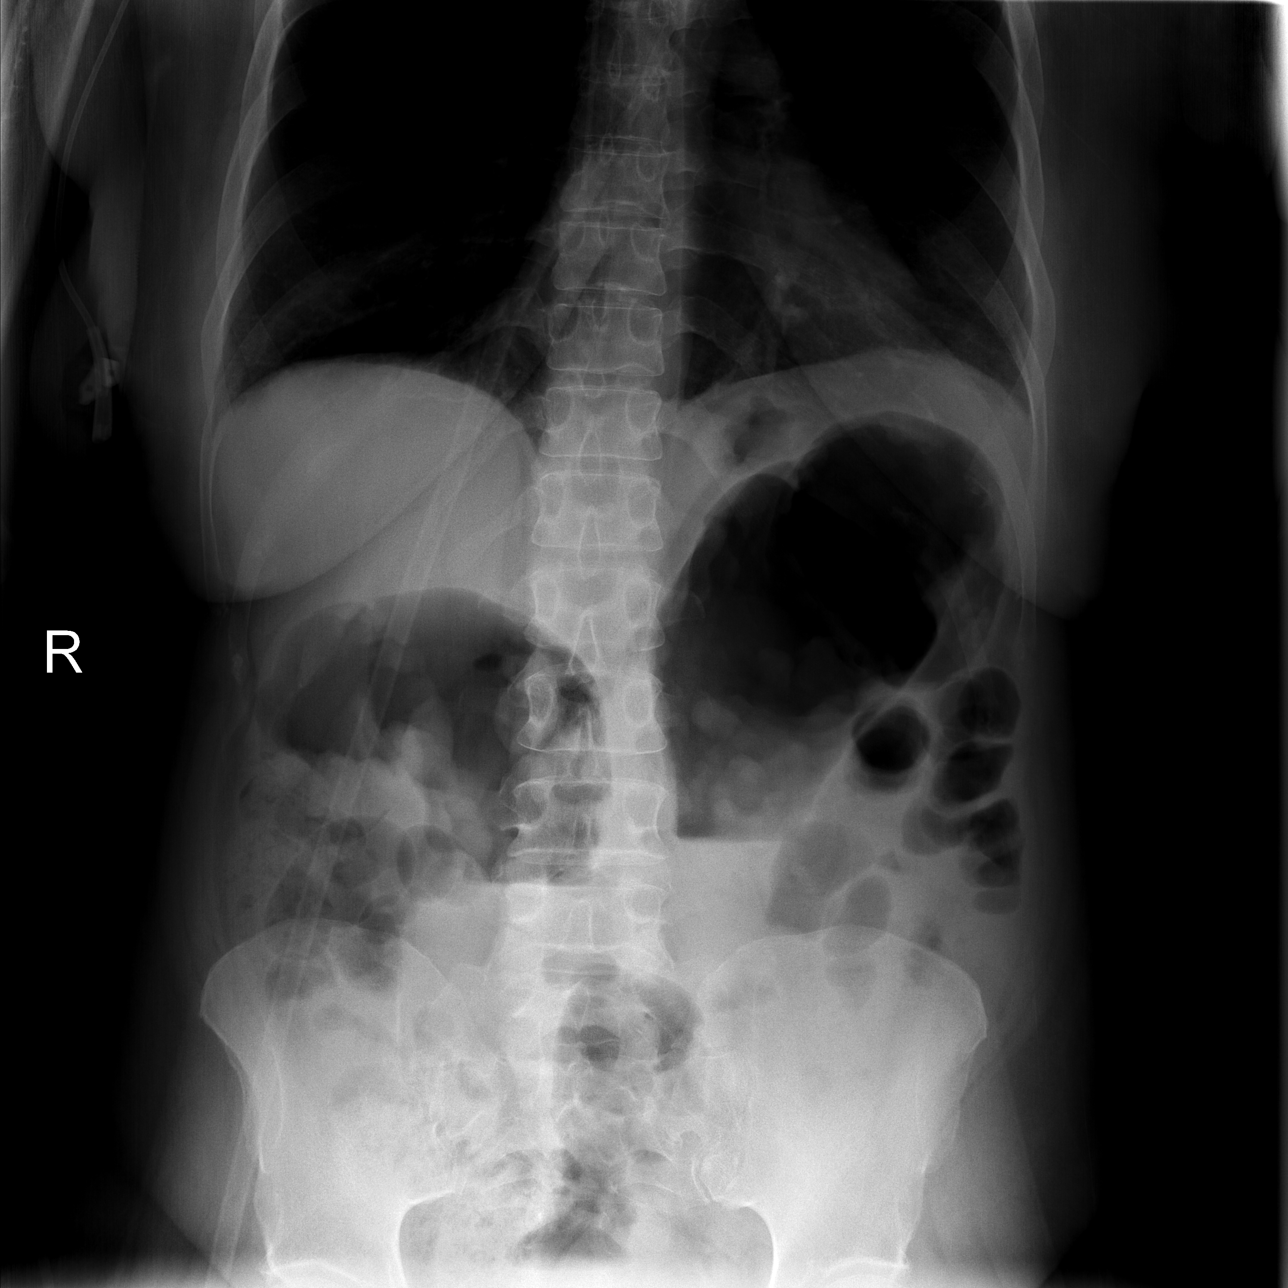

[t abdomen supine]
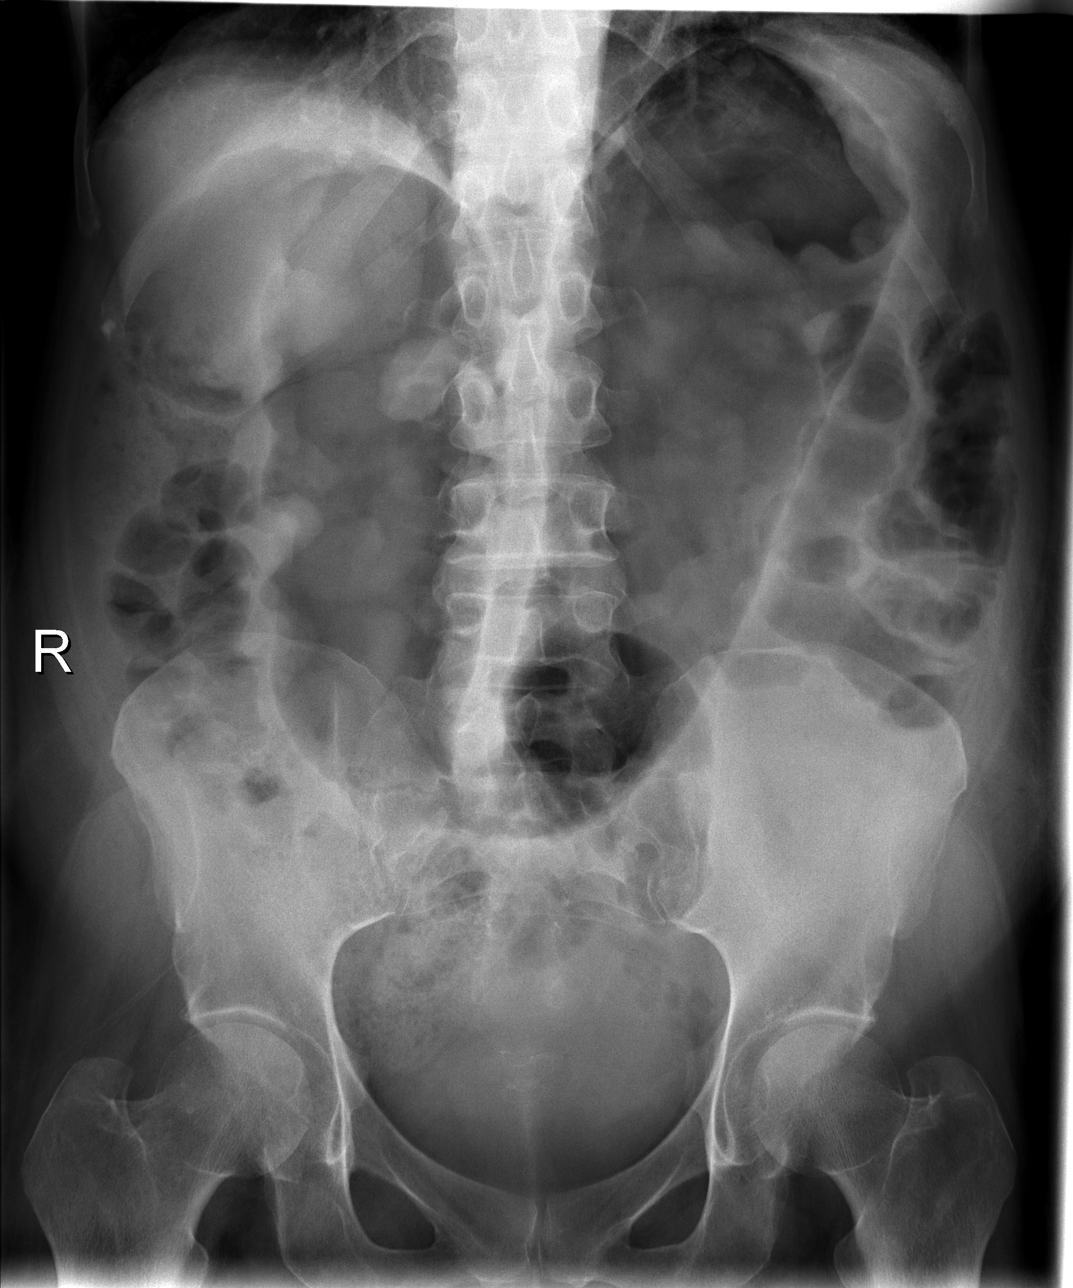

[3 of 3 positions shown; findings below may reference images not displayed]

FINDINGS: There has been no significant change in gaseous distention of the
transverse colon, measuring 9.9 cm in maximum transverse diameter. Marked
nodularity of the transverse colon mucosa is unchanged. The remainder of the
bowel loops are normal in caliber. No free peritoneal air is seen. Stable normal
sized heart and clear lungs. The right PICC is unchanged. Diffuse osteopenia.

IMPRESSION

Stable gaseous distention and marked mucosal nodularity of the transverse colon.
The mucosal nodularity is most likely due to Crohn's disease involving the
colon. Pseudomembranous colitis can also have this appearance.

## 2007-08-12 ENCOUNTER — Ambulatory Visit: Payer: Self-pay | Admitting: Critical Care Medicine

## 2007-08-12 IMAGING — CT CT PELVIS W/ CM
2 of 5 series · 17 of 46 positions shown, 19 images · IV contrast (APPLIED)
Comparison: 12/10/05.

CLINICAL DATA: Abdominal pain, recurrent exacerbation, evaluate for abscess.  Diarrhea.
ABDOMEN CT WITH CONTRAST:
TECHNIQUE: Multidetector CT imaging of the abdomen was performed following the standard protocol during bolus administration of intravenous contrast.
Contrast:  100 cc Omnipaque 300.
TECHNIQUE: Multidetector CT imaging of the pelvis was performed following the standard protocol during bolus administration of intravenous contrast.

[Series 2: abd/ with 5.0 b31f st · axial · 0.62mm/px · z∈[-478,-122]mm · 14 of 81 slices shown, 16 images]
[im 5/81  soft-tissue]
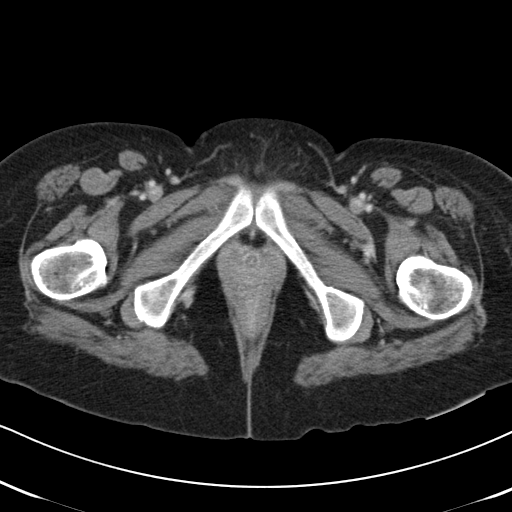
[im 5/81  bone]
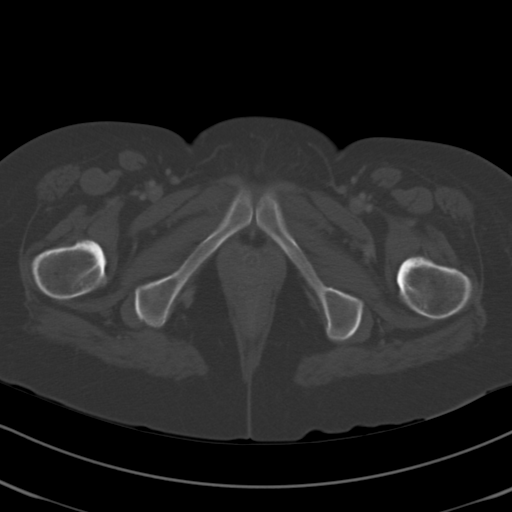
[im 9/81  soft-tissue]
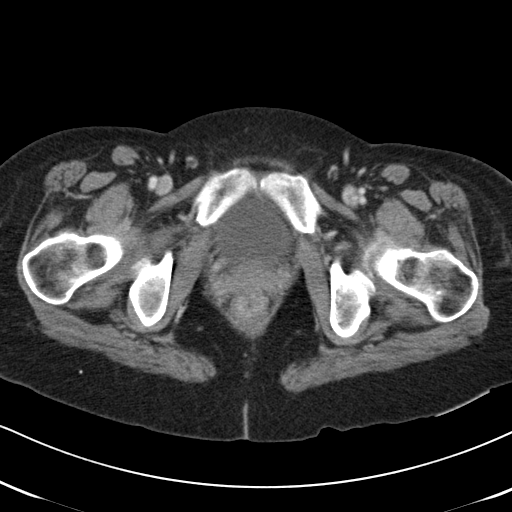
[im 17/81  soft-tissue]
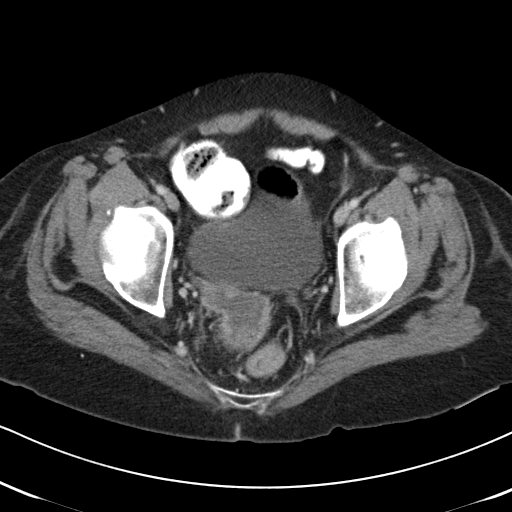
[im 22/81  soft-tissue]
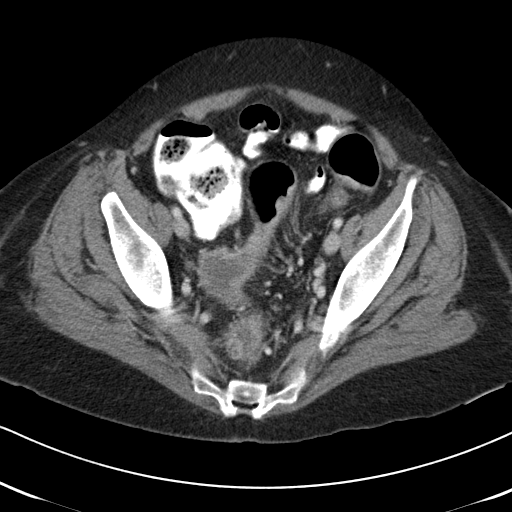
[im 26/81  soft-tissue]
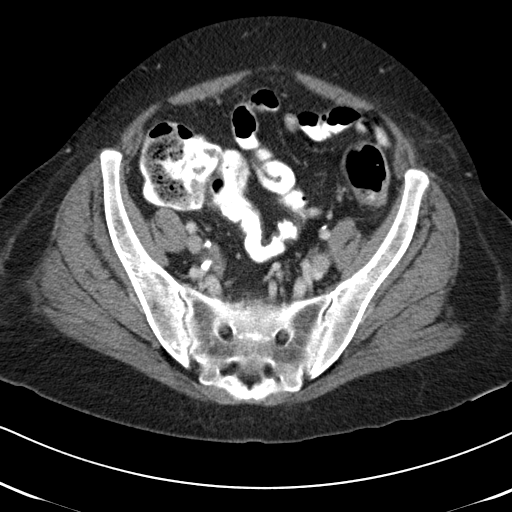
[im 34/81  soft-tissue]
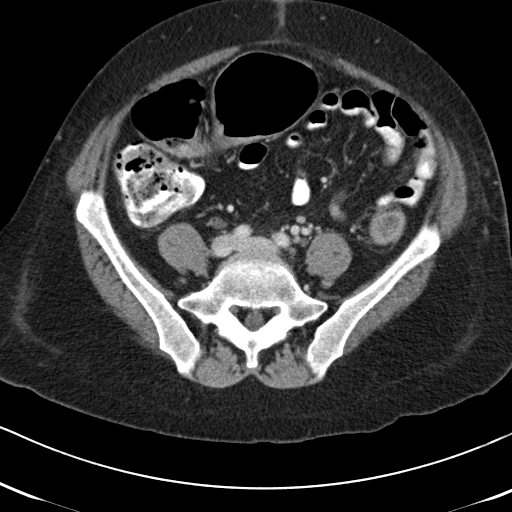
[im 38/81  soft-tissue]
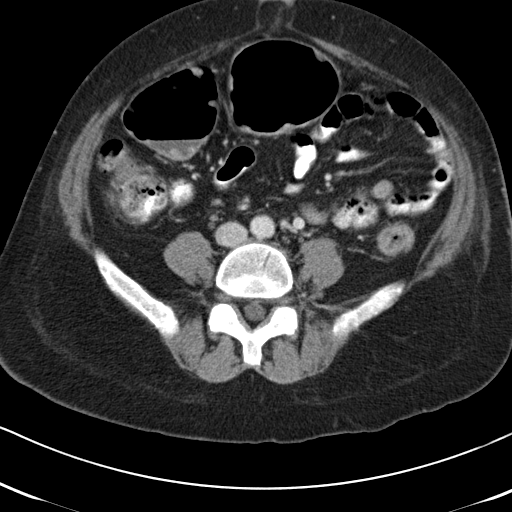
[im 43/81  soft-tissue]
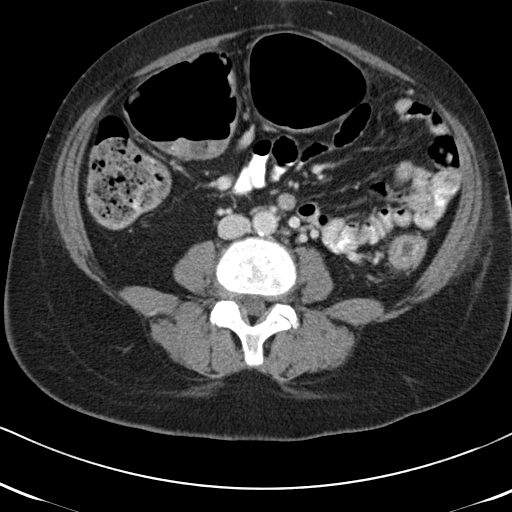
[im 47/81  soft-tissue]
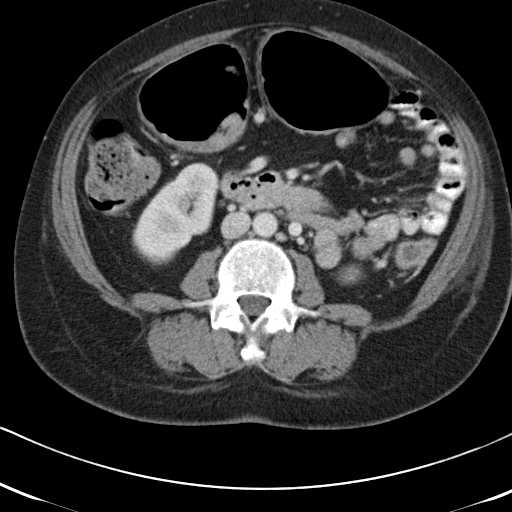
[im 47/81  bone]
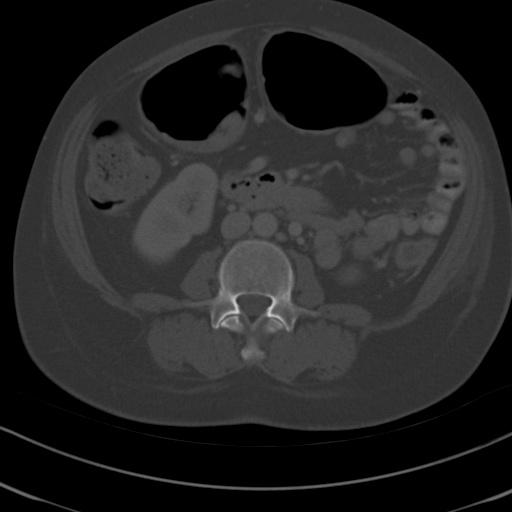
[im 55/81  soft-tissue]
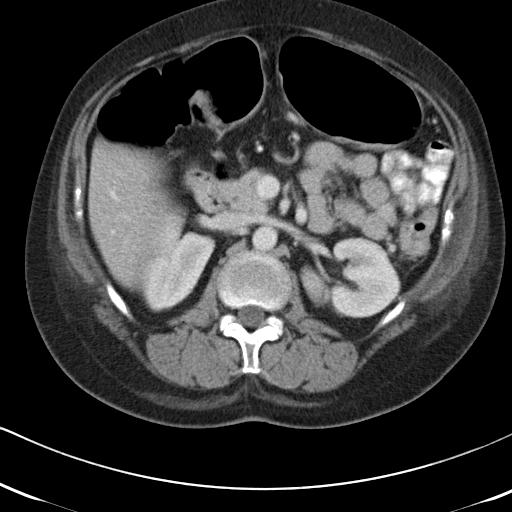
[im 59/81  soft-tissue]
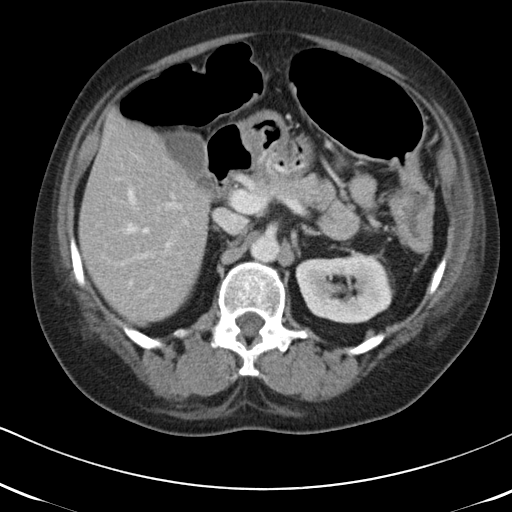
[im 64/81  soft-tissue]
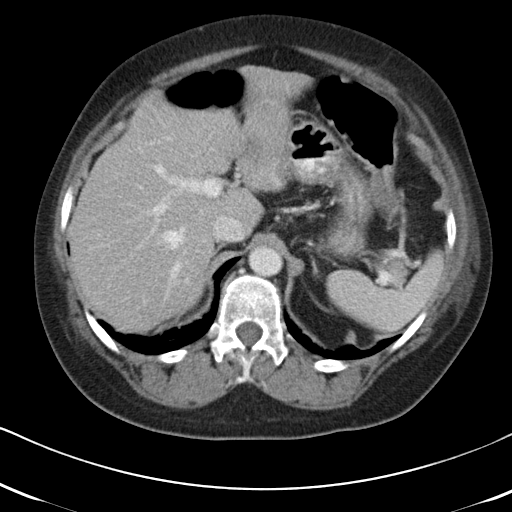
[im 72/81  soft-tissue]
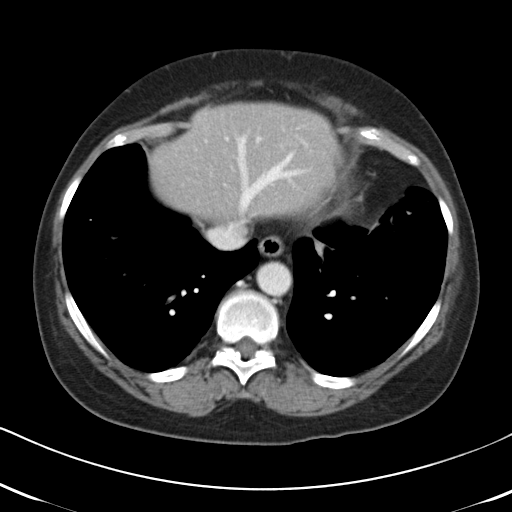
[im 76/81  soft-tissue]
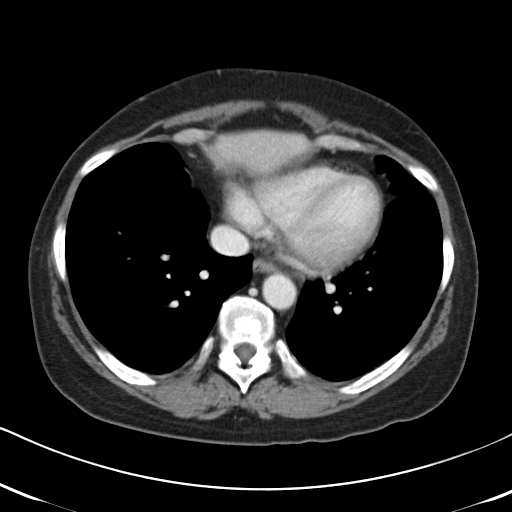

[Series 602: coronals · coronal · 0.79mm/px · 3 of 110 slices shown]
[im 37/110  soft-tissue]
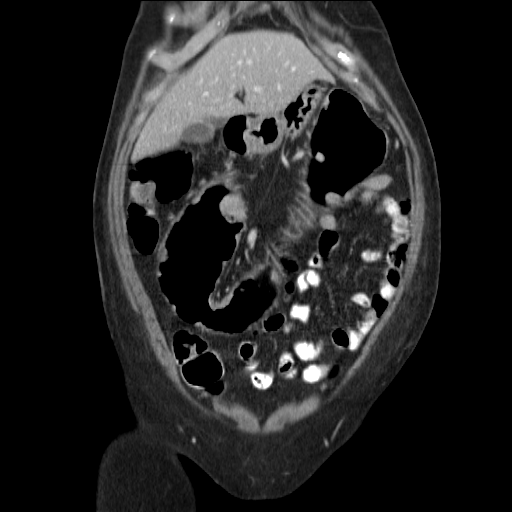
[im 49/110  soft-tissue]
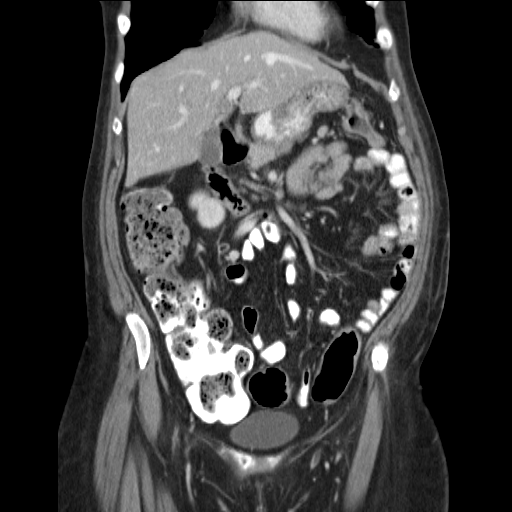
[im 61/110  soft-tissue]
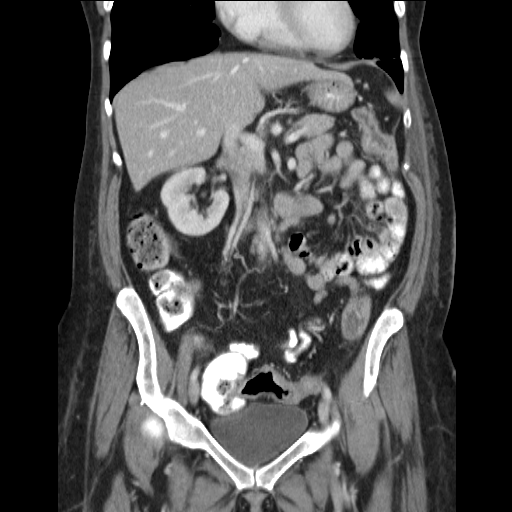

[17 of 46 positions shown; findings below may reference images not displayed]

FINDINGS: Liver, gallbladder, adrenal glands and right kidney are unremarkable.  A tiny low-density lesion in the left kidney is too small characterize.  The left kidney, spleen, pancreas, stomach and small bowel are otherwise unremarkable.  No pathologically enlarged lymph nodes.
IMPRESSION: 1. No acute findings.  
2. Low-density lesion, left kidney, too small to characterize.
PELVIS CT WITH CONTRAST:
FINDINGS: There is gaseous distention of the transverse colon to the level of the splenic flexure where the colonic wall appears somewhat irregular and thickened, as questioned on the most recent prior exam of 12/10/05.  No free fluid.  No pathologically enlarged lymph nodes.
IMPRESSION: Dilatation of the transverse colon to the level of colonic wall thickening and irregularity in the splenic flexure.  Question stricture.

## 2007-08-13 DIAGNOSIS — I498 Other specified cardiac arrhythmias: Secondary | ICD-10-CM | POA: Insufficient documentation

## 2007-08-15 IMAGING — CR DG ABDOMEN 1V
1 series · 1 of 1 positions shown · non-contrast
Comparison: CT of the abdomen 12/22/05.

CLINICAL DATA: Crohn disease exacerbation and distention of the transverse colon.  
ABDOMEN ? 1 VIEW:

[t abdomen supine]
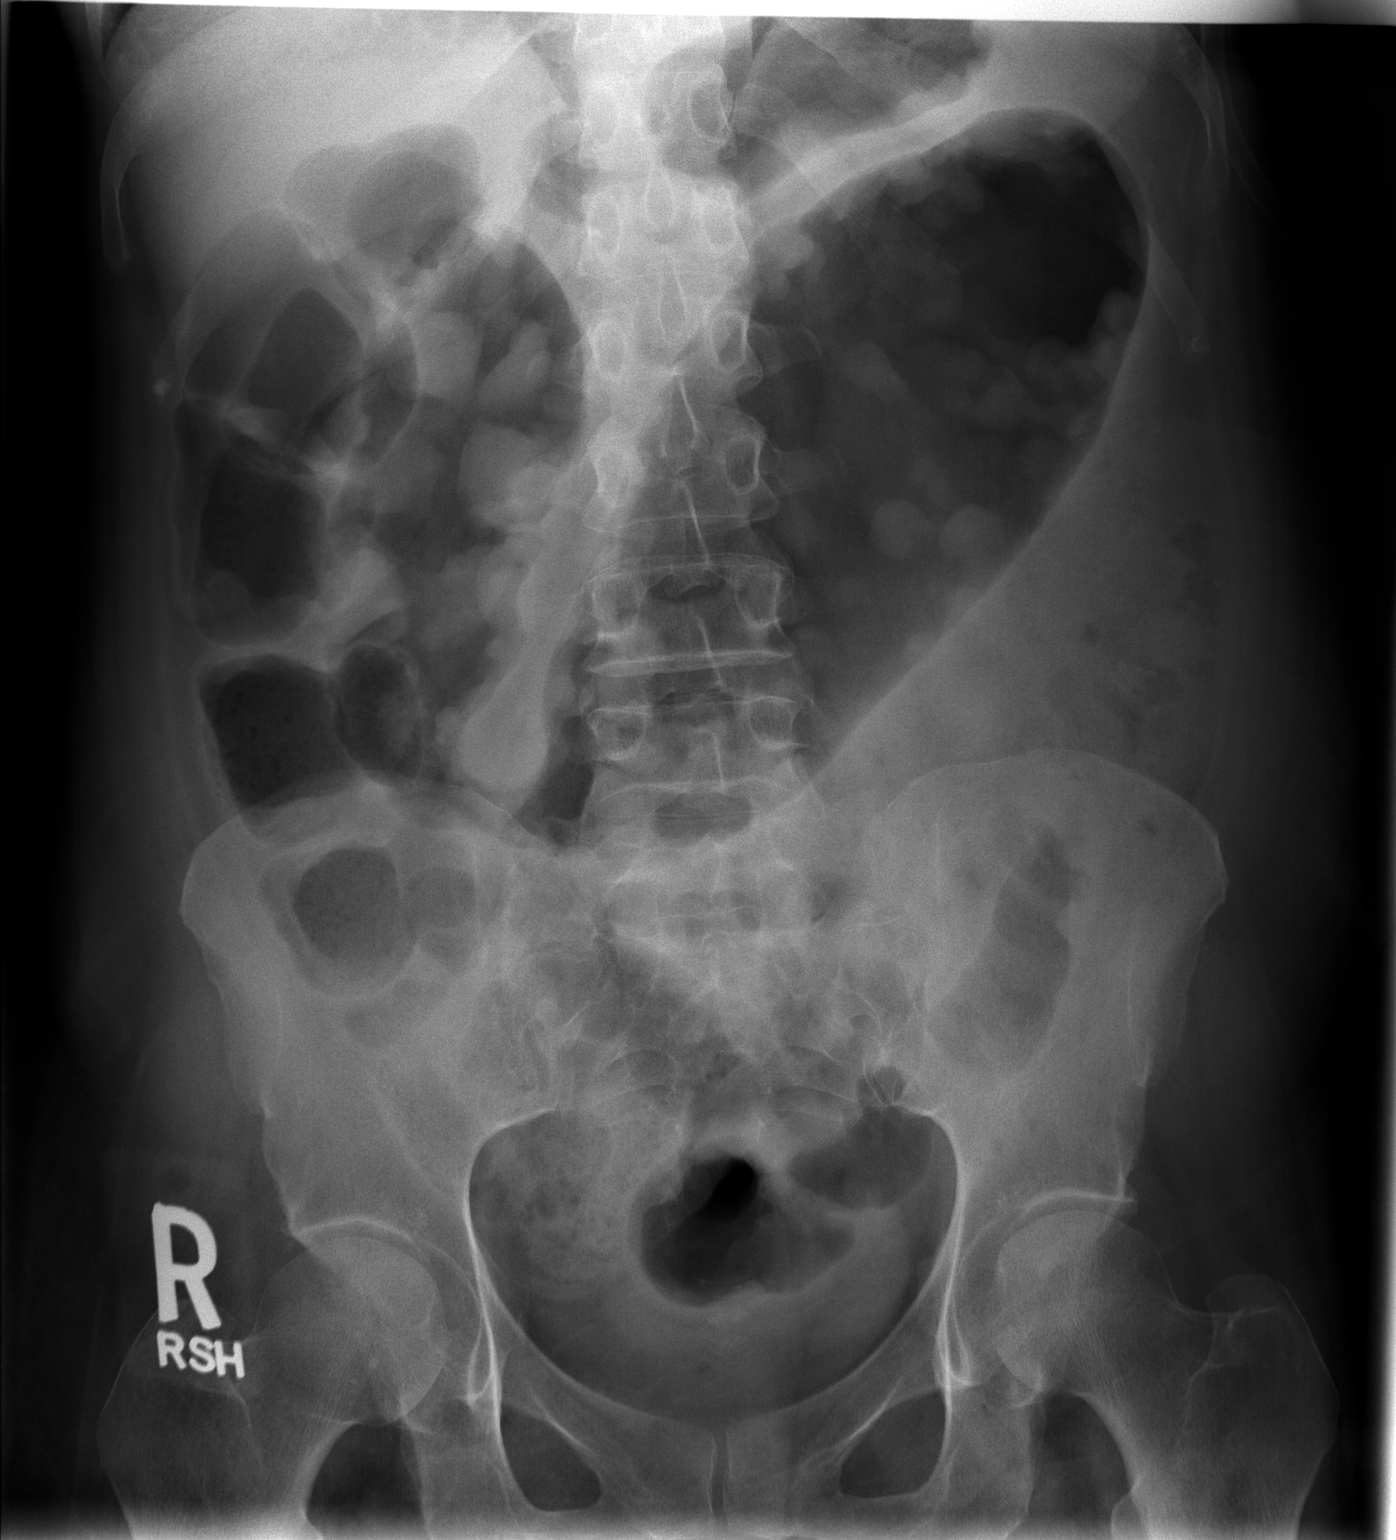

[1 of 1 positions shown; findings below may reference images not displayed]

FINDINGS: Relative to CT measurements there is increased distention of the transverse colon especially involving the mid to distal transverse colon in the left abdomen.  Maximal diameter is approximately 10.7 cm.  This definitely represents increased luminal dimensions since CT with coronal reconstructions suggesting maximal diameter of approximately 7.6 to 8 cm; prior plain film dimensions were slightly greater.  
Multiple areas of mural nodularity are again noted throughout the distended portion of the colon.  Some air is present in nondilated proximal colon and distal colon.  No evidence of associated small bowel obstruction.  No obvious pneumatosis is seen in the dilated transverse segment.
IMPRESSION: Increased distention of a mid to distal transverse colon now measuring approximately 10.7 cm in estimated diameter.

## 2007-08-16 ENCOUNTER — Telehealth (INDEPENDENT_AMBULATORY_CARE_PROVIDER_SITE_OTHER): Payer: Self-pay | Admitting: *Deleted

## 2007-08-17 IMAGING — CR DG ABDOMEN 1V
1 series · 1 of 1 positions shown · non-contrast
Comparison: none

CLINICAL DATA: Abdominal pain.  Crohn?s exacerbation.  Evaluate bowel distention.
 ABDOMEN ? 1 VIEW:

[t abdomen supine]
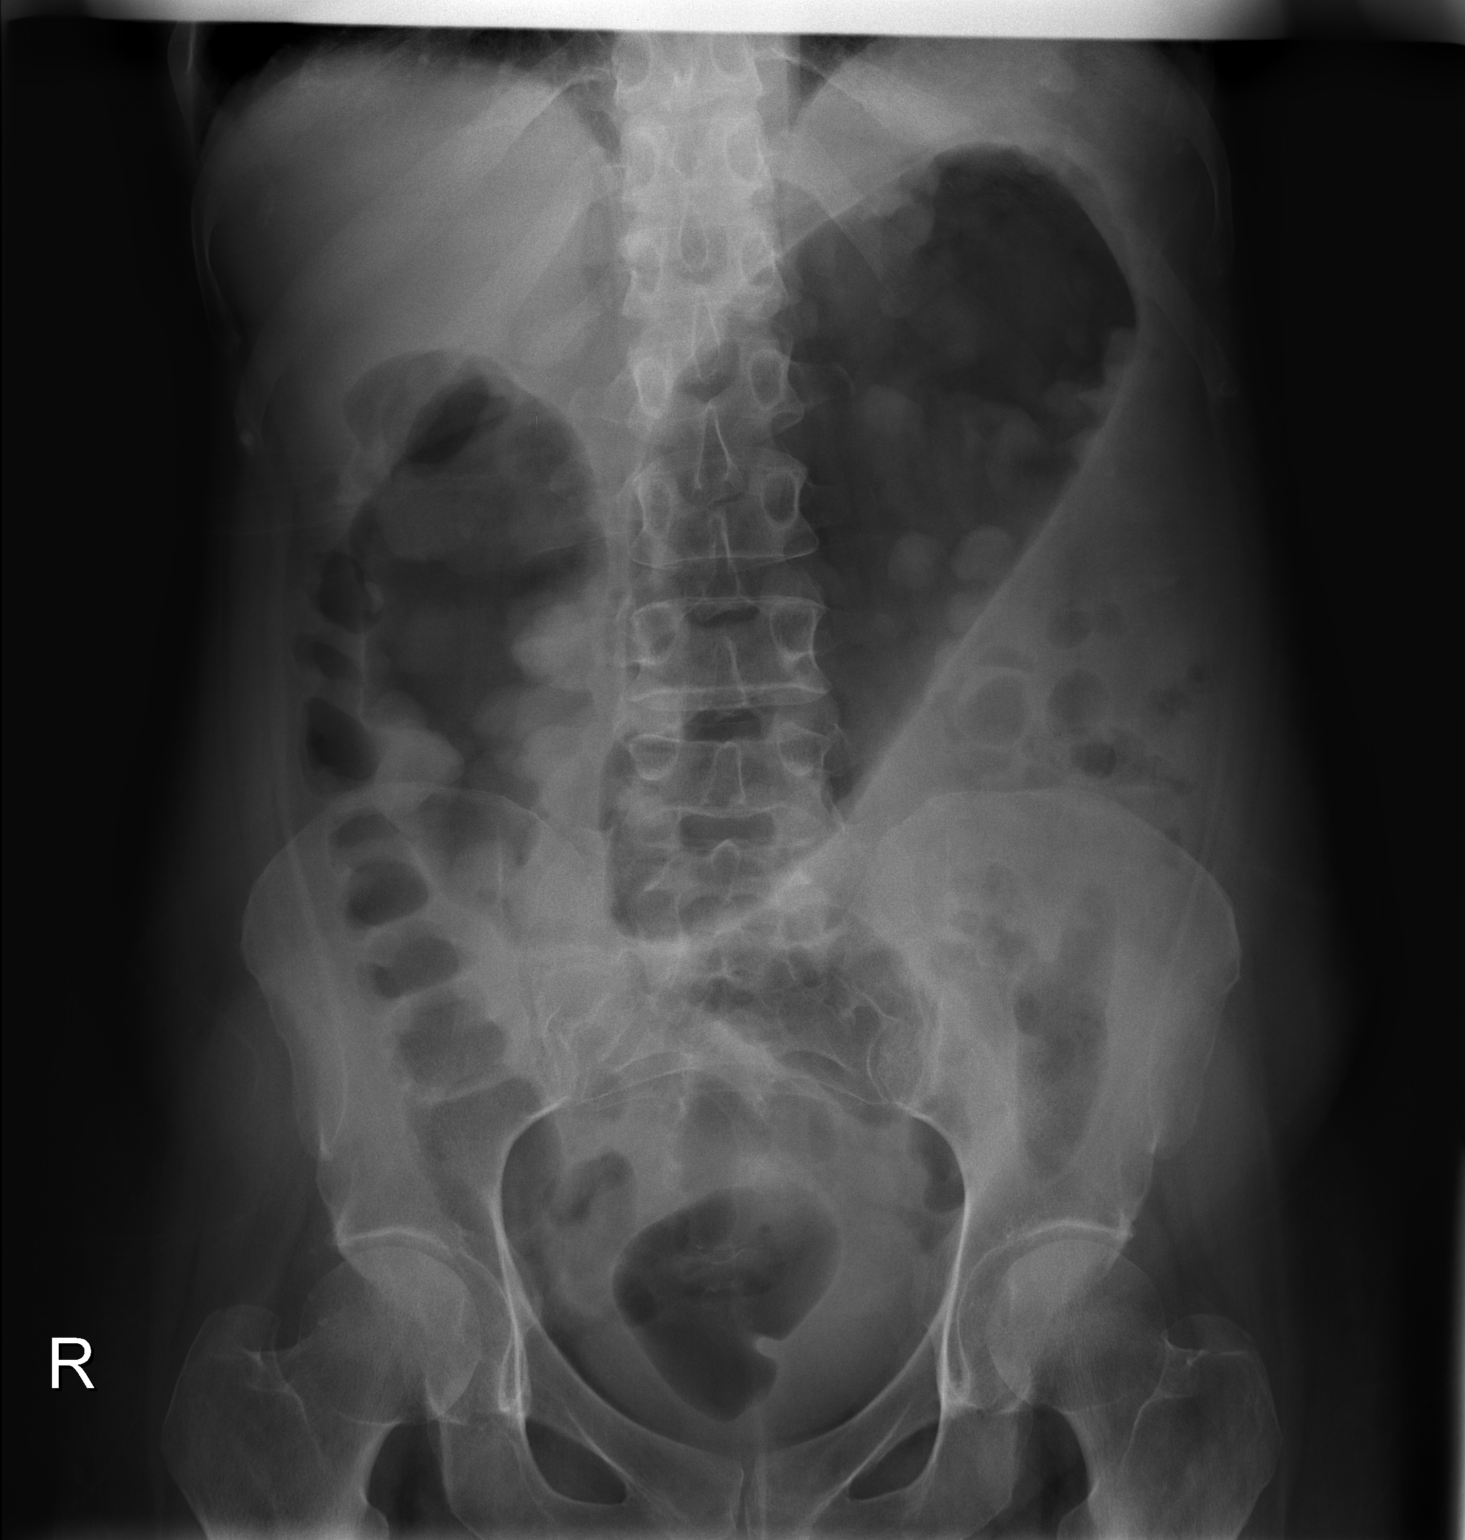

[1 of 1 positions shown; findings below may reference images not displayed]

FINDINGS: There is again noted gaseous distention of the transverse colon measuring 10.8 cm compared with 10.7 cm previously.  Mural nodularity is again noted.  There is no evidence for free intraperitoneal air.
IMPRESSION: Stable gaseous distention of mid to distal transverse colon, now measuring 10.8 cm in diameter.

## 2007-08-21 IMAGING — CR DG ABDOMEN 1V
1 series · 1 of 1 positions shown · non-contrast
Comparison: none

CLINICAL DATA: Abdominal pain. Crohn?s exacerbation.
 ABDOMEN ? 1 VIEW:

[view not recorded]
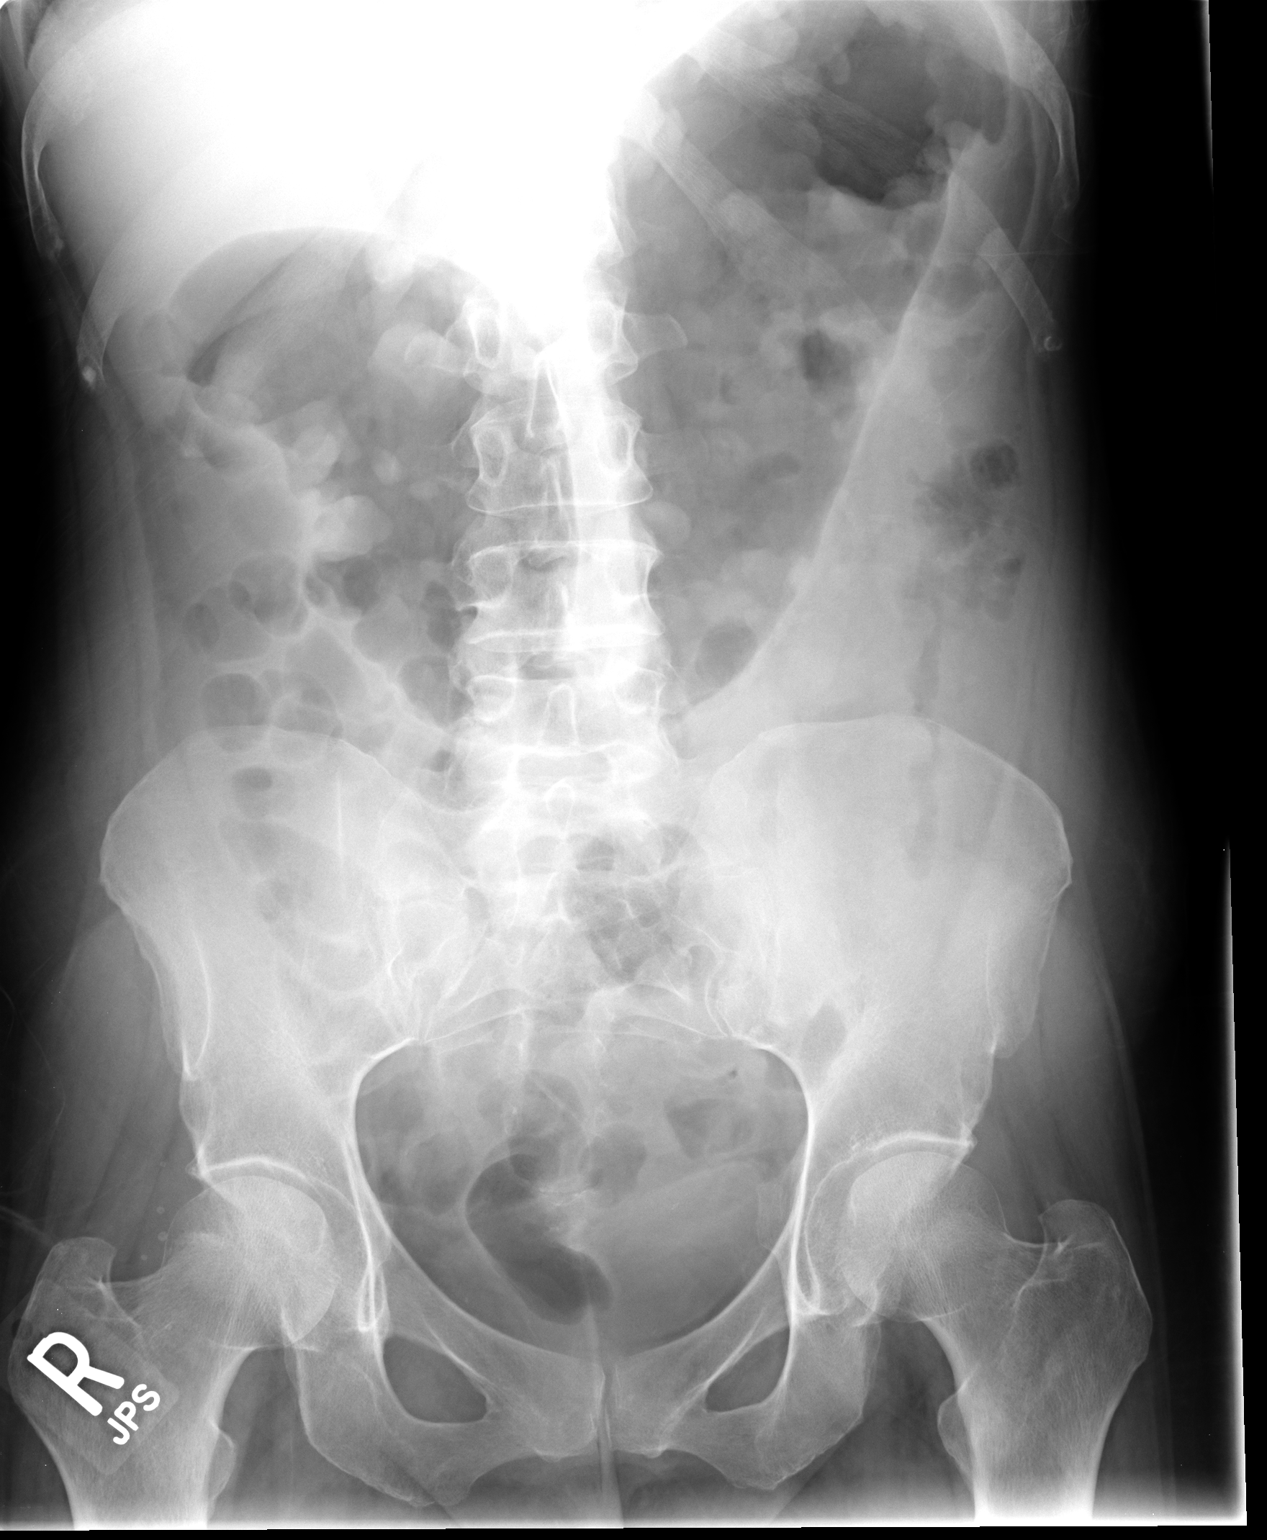

[1 of 1 positions shown; findings below may reference images not displayed]

FINDINGS: No significant change in gaseous distention of the transverse colon compatible with toxic megacolon.  There may be polypoid lesions arising from the colonic wall possibly representing pseudopolyps. Minimal small bowel distention right lower quadrant.
IMPRESSION: Transverse colon distention compatible with toxic megacolon persists.   Mild small bowel ileus.

## 2007-08-23 ENCOUNTER — Encounter: Payer: Self-pay | Admitting: Critical Care Medicine

## 2007-08-23 IMAGING — CT CT ABDOMEN W/ CM
2 of 5 series · 17 of 46 positions shown, 19 images · IV contrast (ORAL OMNI 350 & 100 ML OMNI 300)
Comparison: CT abdomen 12/22/05.

CLINICAL DATA: Abdominal pain.  History of Crohn?s.
ABDOMEN CT WITH CONTRAST:
TECHNIQUE: Multidetector CT imaging of the abdomen was performed following the standard protocol during bolus administration of intravenous contrast.
Contrast:  100 mL Omnipaque 300.
TECHNIQUE: Multidetector CT imaging of the pelvis was performed following the standard protocol during bolus administration of intravenous contrast.

[Series 2: routine abdomen · axial · 0.80mm/px · z∈[-429,-64]mm · 14 of 83 slices shown, 16 images]
[im 5/83  soft-tissue]
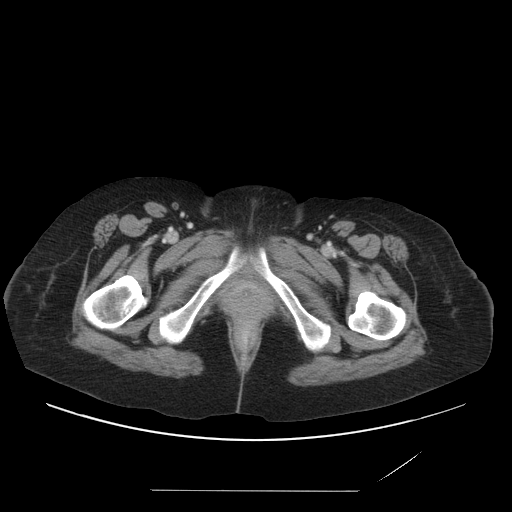
[im 5/83  bone]
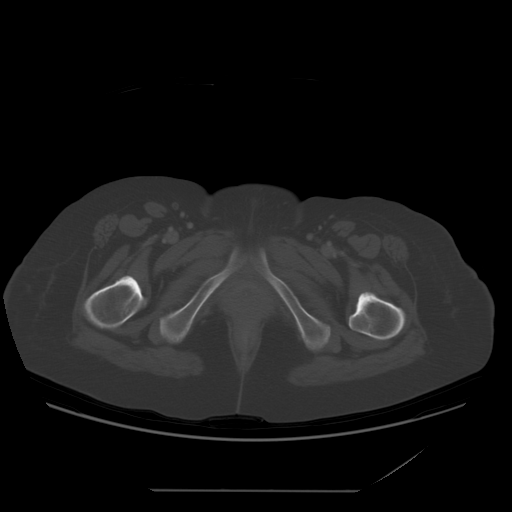
[im 10/83  soft-tissue]
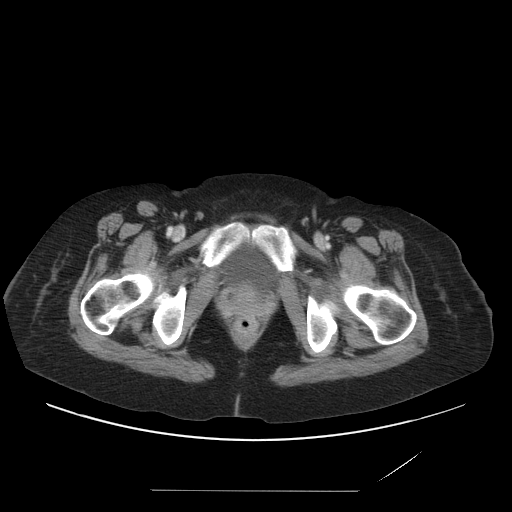
[im 19/83  soft-tissue]
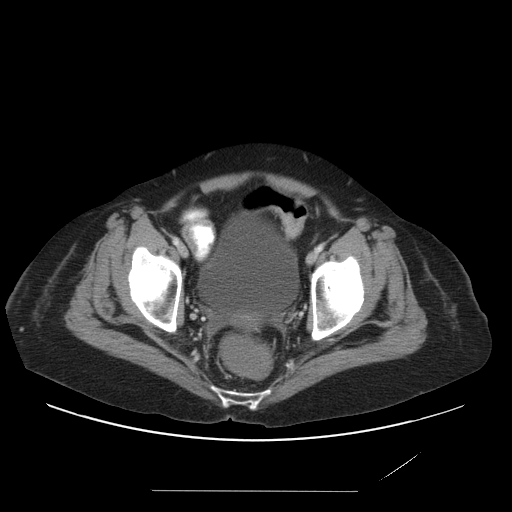
[im 23/83  soft-tissue]
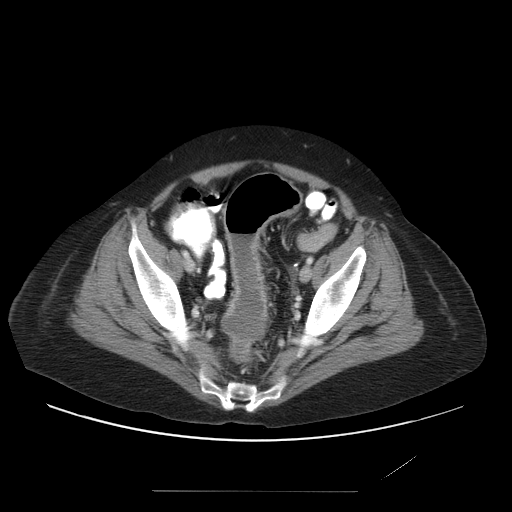
[im 28/83  soft-tissue]
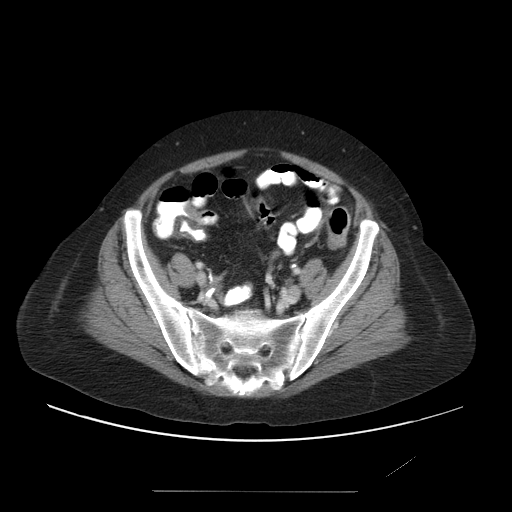
[im 32/83  soft-tissue]
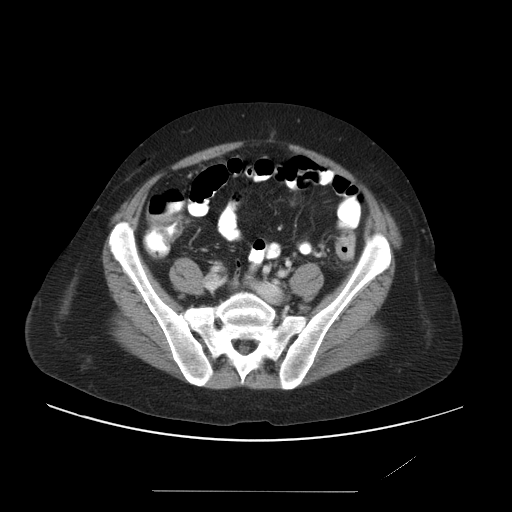
[im 37/83  soft-tissue]
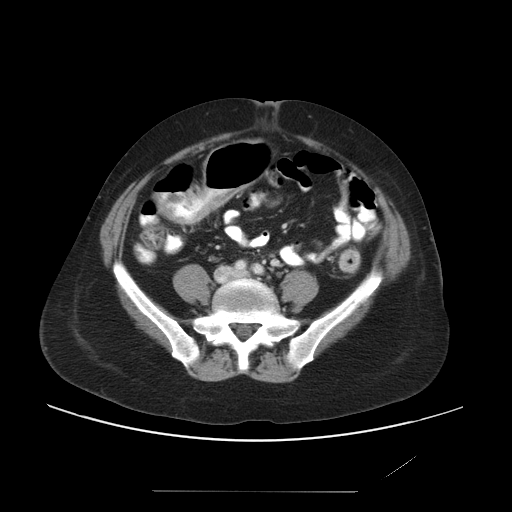
[im 46/83  soft-tissue]
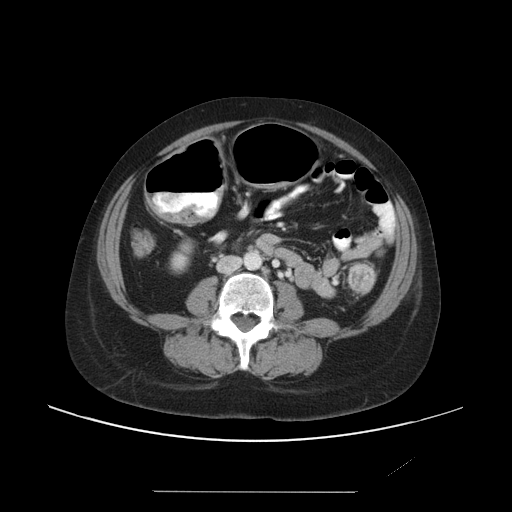
[im 51/83  soft-tissue]
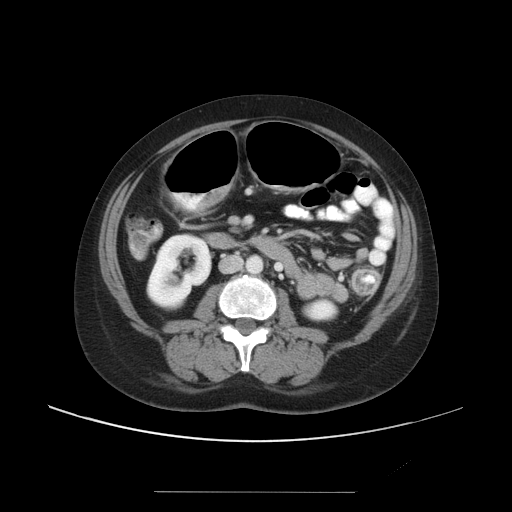
[im 51/83  bone]
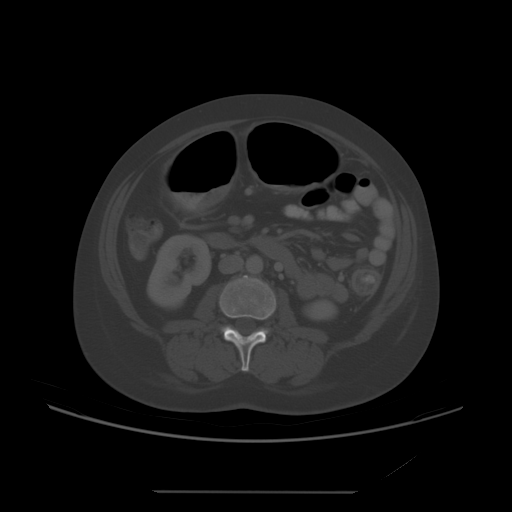
[im 55/83  soft-tissue]
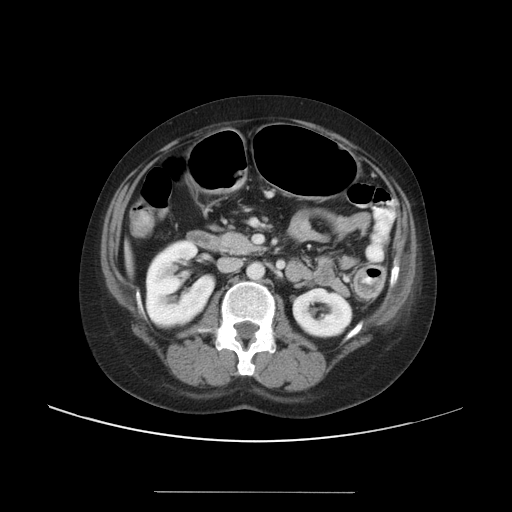
[im 60/83  soft-tissue]
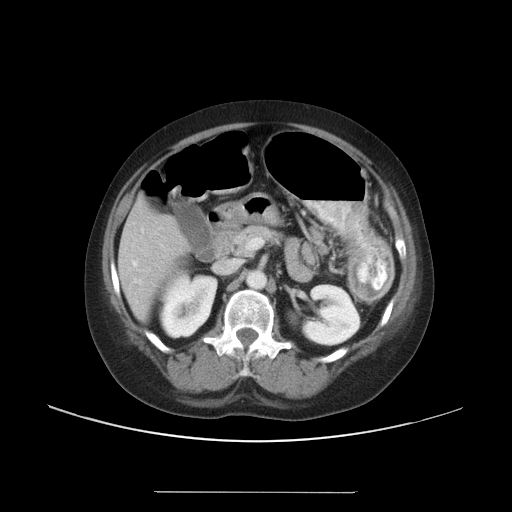
[im 64/83  soft-tissue]
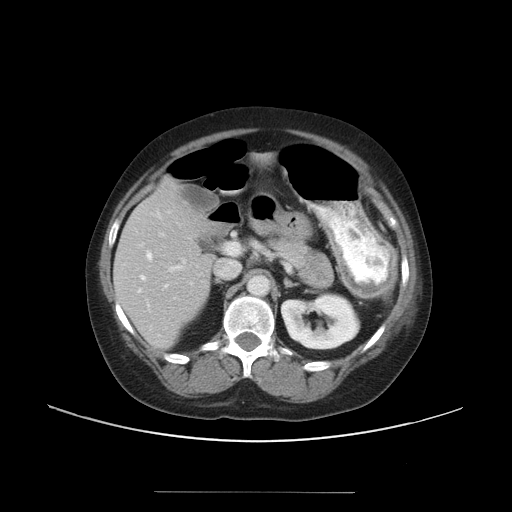
[im 73/83  soft-tissue]
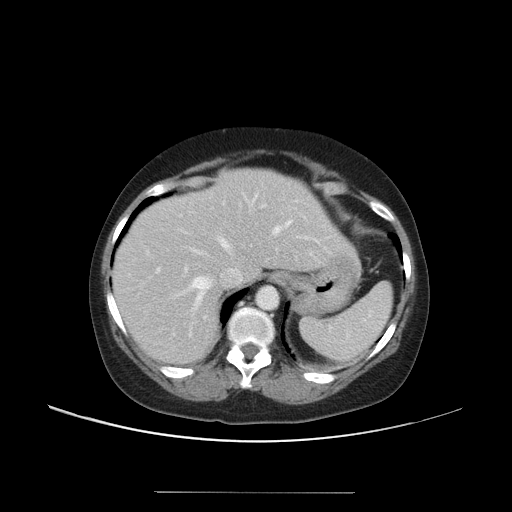
[im 78/83  soft-tissue]
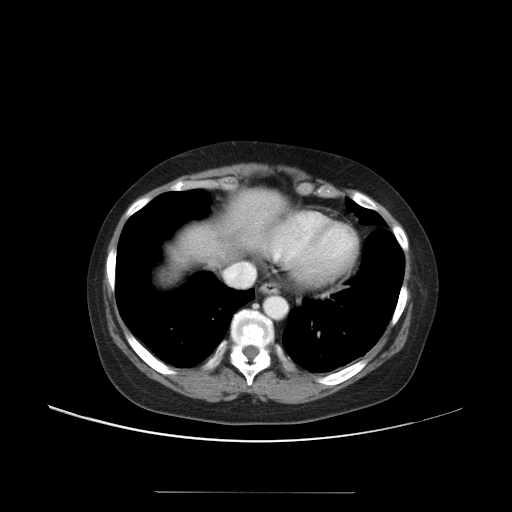

[Series 401: reformatted · coronal · 0.80mm/px · 3 of 116 slices shown]
[im 39/116  soft-tissue]
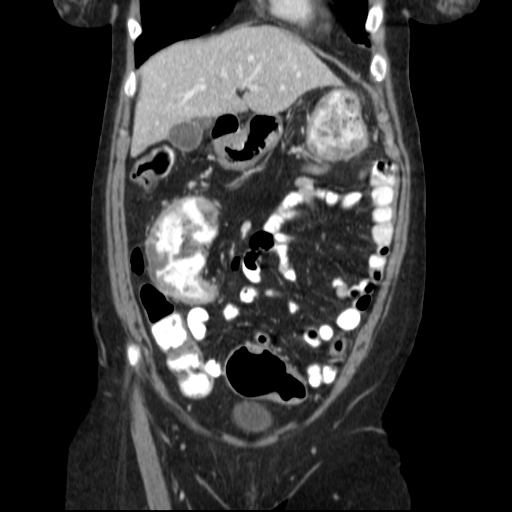
[im 52/116  soft-tissue]
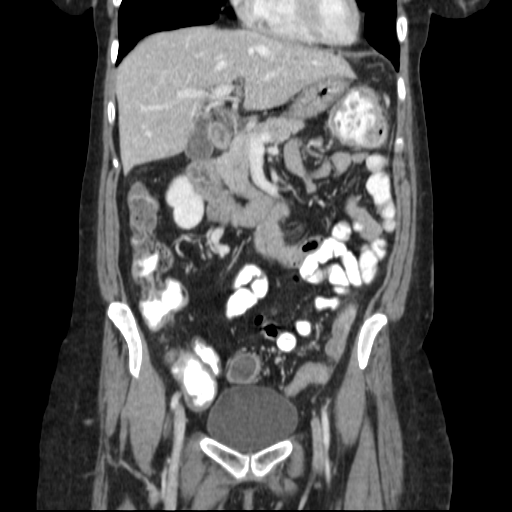
[im 64/116  soft-tissue]
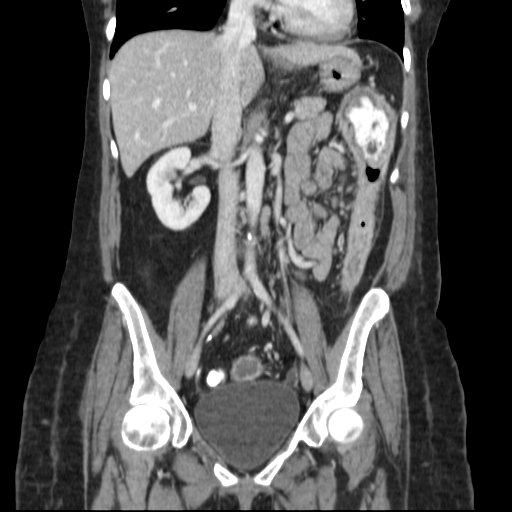

[17 of 46 positions shown; findings below may reference images not displayed]

FINDINGS: Lung bases clear.  The intraabdominal organs remain normal without pathology or significant change. 
The transverse colon remains moderately distended with nodularity.  There is no evidence for perforation or surrounding inflammatory change.  The appearance over the 10-day interval is not significantly different.
IMPRESSION: Persistent Crohn?s colitis with significant dilatation.
PELVIS CT WITH CONTRAST:
FINDINGS: The left colon and sigmoid are moderately thickened particularly in the region of the splenic flexure.  Focal stricture at the splenic flexure level is again questioned.  There is no surrounding inflammation, free fluid, or pneumoperitoneum.  The rectosigmoid region is moderately dilated.  Compared to the prior study, there is no change.  No other abnormal findings in the pelvis are identified.
IMPRESSION: Stable findings of Crohn?s colitis.

## 2007-09-13 ENCOUNTER — Ambulatory Visit: Payer: Self-pay | Admitting: Emergency Medicine

## 2007-09-13 DIAGNOSIS — J209 Acute bronchitis, unspecified: Secondary | ICD-10-CM

## 2007-09-27 ENCOUNTER — Ambulatory Visit: Payer: Self-pay | Admitting: Internal Medicine

## 2007-10-21 ENCOUNTER — Ambulatory Visit: Payer: Self-pay | Admitting: Internal Medicine

## 2007-11-15 ENCOUNTER — Emergency Department (HOSPITAL_COMMUNITY): Admission: EM | Admit: 2007-11-15 | Discharge: 2007-11-15 | Payer: Self-pay | Admitting: Emergency Medicine

## 2007-11-26 ENCOUNTER — Inpatient Hospital Stay (HOSPITAL_COMMUNITY): Admission: EM | Admit: 2007-11-26 | Discharge: 2007-11-29 | Payer: Self-pay | Admitting: Emergency Medicine

## 2007-11-26 ENCOUNTER — Ambulatory Visit: Payer: Self-pay | Admitting: Internal Medicine

## 2007-12-13 IMAGING — CR DG CHEST 1V PORT
1 series · 1 of 1 positions shown · non-contrast
Comparison: 12/10/05.

CLINICAL DATA: Chest pain.  Rapid heart rate.  COPD.  
 PORTABLE CHEST - 1 VIEW, 04/24/06 AT 2827 HOURS:

[view not recorded]
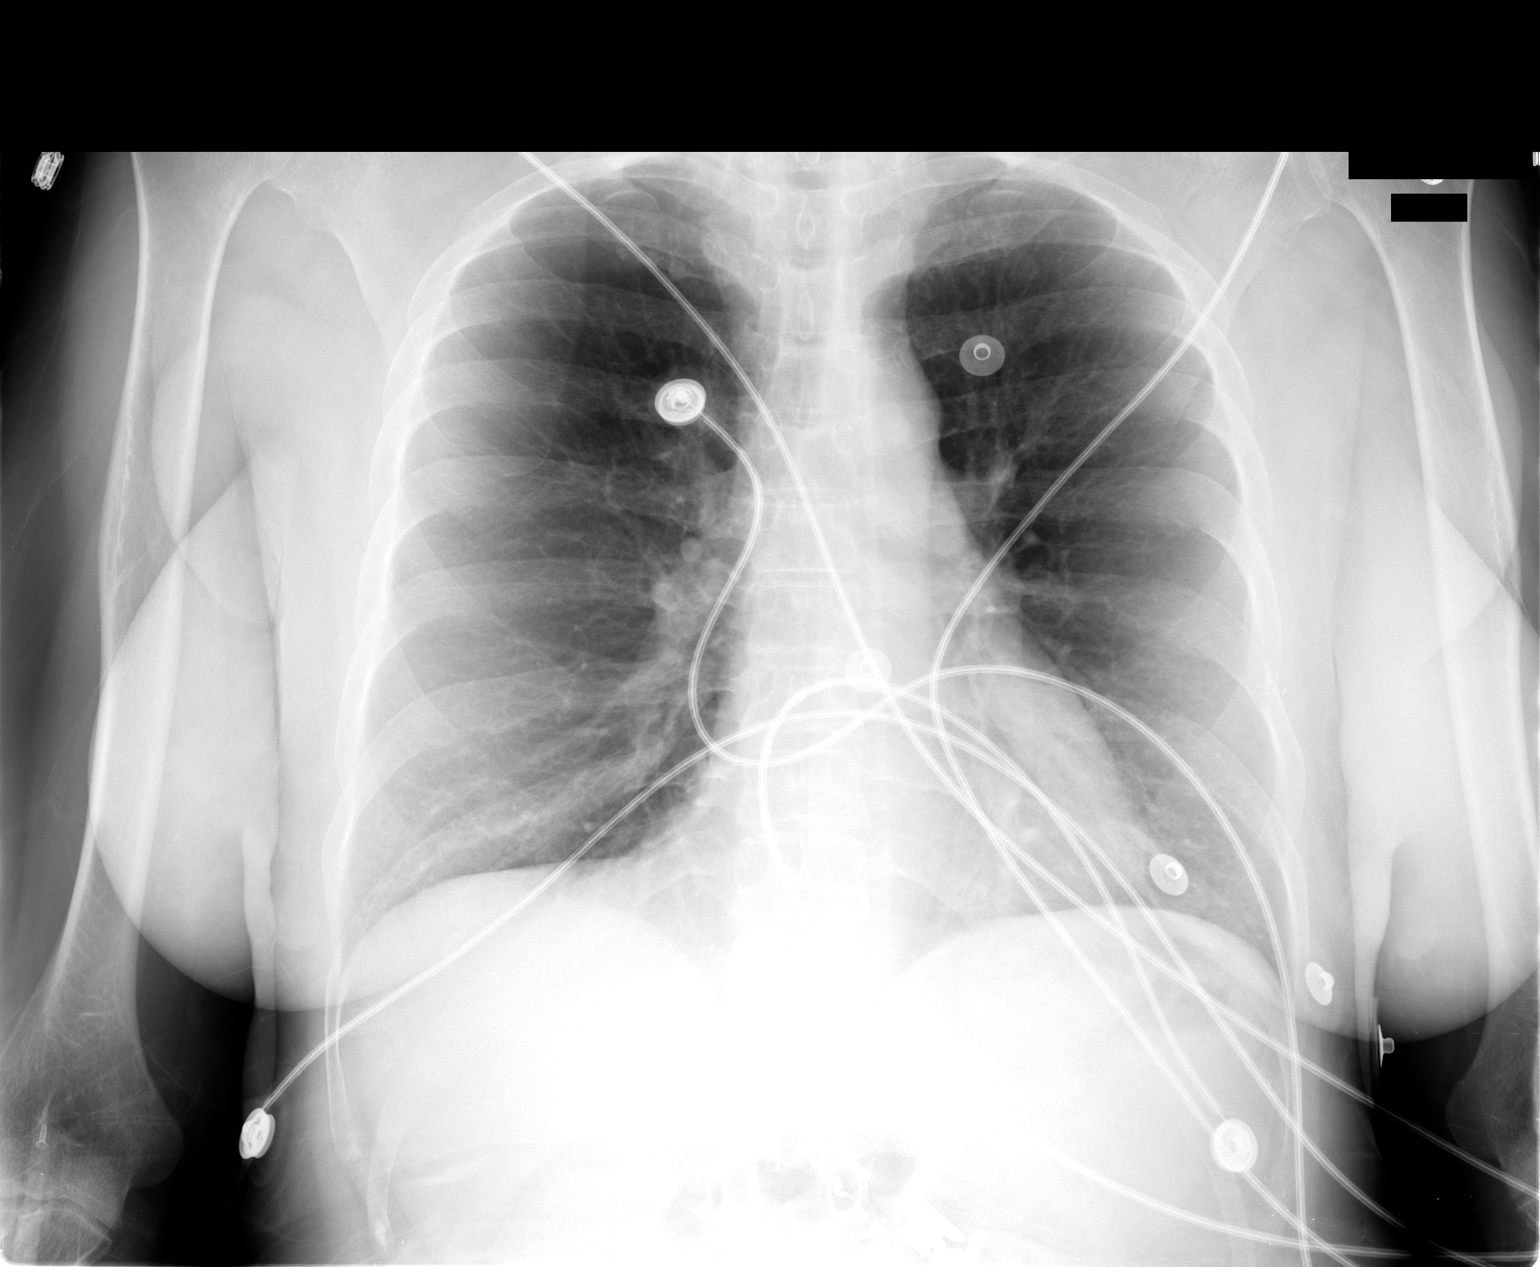

[1 of 1 positions shown; findings below may reference images not displayed]

FINDINGS: The lungs are clear and hyperaerated.  The heart is within upper limits of normal.  No bony abnormality is seen.
IMPRESSION: No active lung disease.  No change in hyperaeration consistent with COPD.

## 2007-12-15 ENCOUNTER — Ambulatory Visit: Payer: Self-pay | Admitting: Critical Care Medicine

## 2007-12-15 ENCOUNTER — Telehealth: Payer: Self-pay | Admitting: Critical Care Medicine

## 2007-12-17 ENCOUNTER — Telehealth (INDEPENDENT_AMBULATORY_CARE_PROVIDER_SITE_OTHER): Payer: Self-pay | Admitting: *Deleted

## 2007-12-22 ENCOUNTER — Encounter: Payer: Self-pay | Admitting: Critical Care Medicine

## 2007-12-25 IMAGING — CR DG UGI W/ HIGH DENSITY W/KUB
1 series · 1 of 1 positions shown · non-contrast
Comparison: none

CLINICAL DATA: History of Crohn disease.
 KUB:
 A preliminary film of the abdomen shows suture material over the epigastrium.  An ostomy is noted in the right lower quadrant. The bowel gas pattern is nonspecific. 
 UPPER GI, HIGH DENSITY:
 A double contrast upper GI was performed. The mucosa of the esophagus appears normal.  There is a hiatal hernia present and there is a short segment narrowing of the distal esophagus noted just above the level of the hiatal hernia. A barium pill was given at the end of the study which did lodge at the level of the short segment stricture.  A single contrast barium swallow shows the swallowing mechanism to be normal.  No tertiary contractions were seen.   Significant gastroesophageal reflux was noted with the water siphon maneuver.  
 The stomach is normal in contour and peristalsis. The duodenal bulb fills well with no ulceration, and the duodenal loop is in normal position.

[view not recorded]
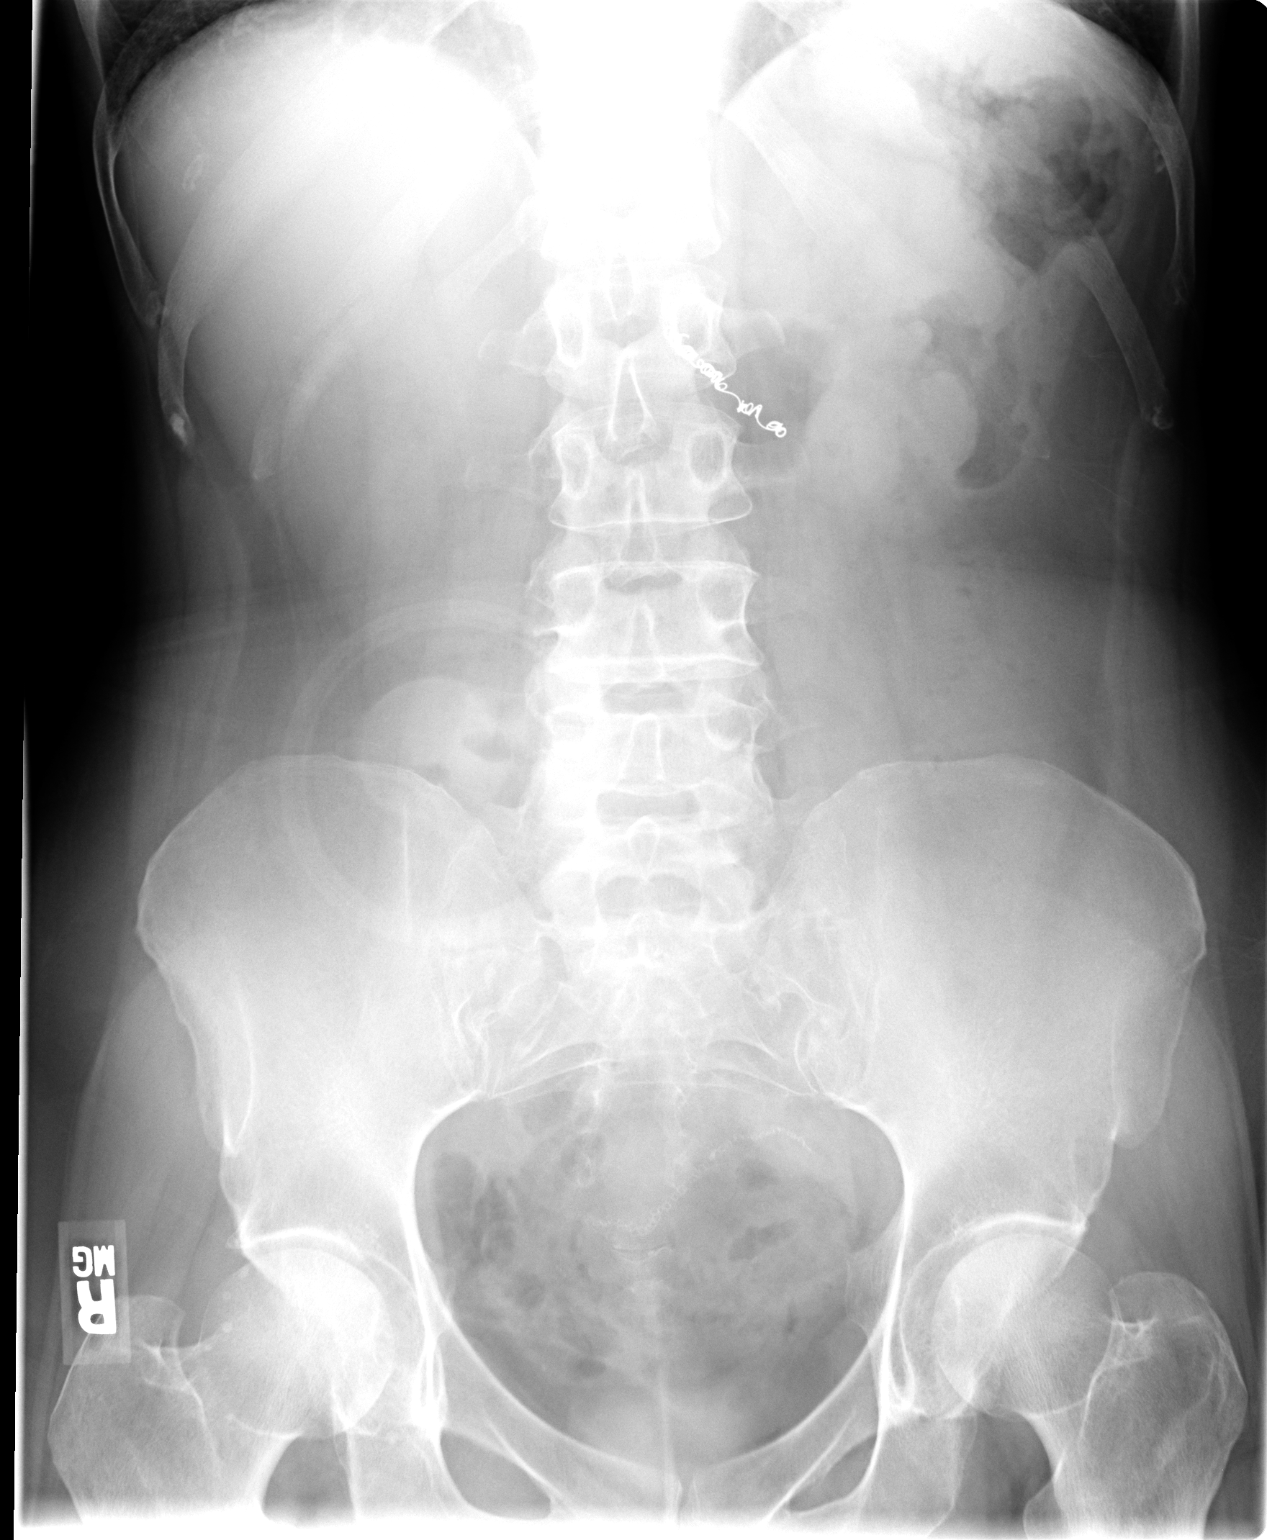

[1 of 1 positions shown; findings below may reference images not displayed]

IMPRESSION: 1.  Short segment stricture just above the level of the hiatal hernia where barium pill lodges.  Significant gastroesophageal reflux is noted with the water siphon maneuver.
 2.  No other abnormality is seen.

## 2007-12-29 ENCOUNTER — Telehealth (INDEPENDENT_AMBULATORY_CARE_PROVIDER_SITE_OTHER): Payer: Self-pay | Admitting: *Deleted

## 2007-12-31 ENCOUNTER — Telehealth (INDEPENDENT_AMBULATORY_CARE_PROVIDER_SITE_OTHER): Payer: Self-pay | Admitting: *Deleted

## 2008-02-02 ENCOUNTER — Ambulatory Visit: Payer: Self-pay | Admitting: Critical Care Medicine

## 2008-03-07 IMAGING — CR DG CHEST 1V PORT
1 series · 1 of 1 positions shown · non-contrast
Comparison: 04/24/06.

CLINICAL DATA: Fever, short of breath.
 PORTABLE CHEST ? 1 VIEW ? 07/18/06 ? 5544 HOURS:

[view not recorded]
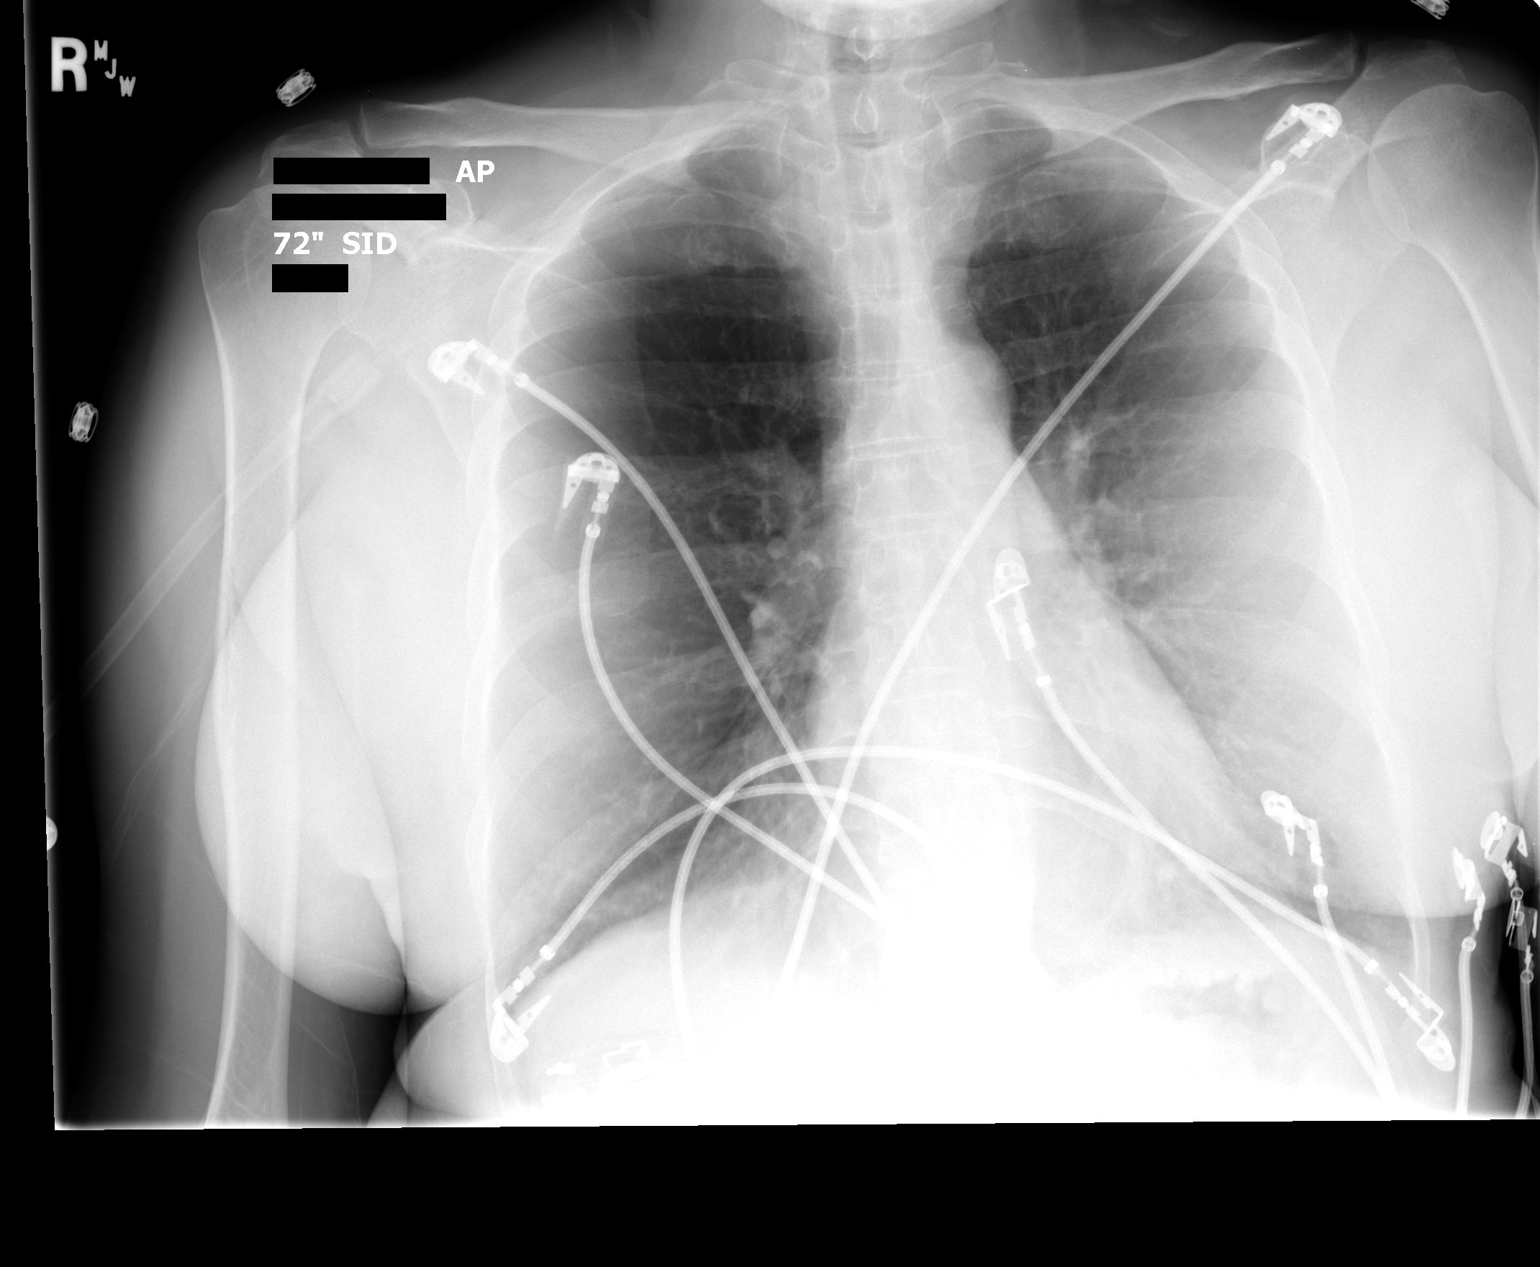

[1 of 1 positions shown; findings below may reference images not displayed]

FINDINGS: The lungs are clear and hyperaerated.  The heart is within normal limits in size.
IMPRESSION: No active lung disease.  COPD.

## 2008-03-20 ENCOUNTER — Telehealth: Payer: Self-pay | Admitting: Critical Care Medicine

## 2008-03-30 ENCOUNTER — Encounter: Payer: Self-pay | Admitting: Critical Care Medicine

## 2008-04-19 ENCOUNTER — Ambulatory Visit: Payer: Self-pay | Admitting: Internal Medicine

## 2008-04-21 ENCOUNTER — Encounter: Payer: Self-pay | Admitting: Critical Care Medicine

## 2008-05-01 ENCOUNTER — Ambulatory Visit: Payer: Self-pay | Admitting: Critical Care Medicine

## 2008-05-05 ENCOUNTER — Ambulatory Visit: Payer: Self-pay | Admitting: Critical Care Medicine

## 2008-05-25 ENCOUNTER — Encounter: Payer: Self-pay | Admitting: Critical Care Medicine

## 2008-05-26 HISTORY — PX: TRACHEOSTOMY: SUR1362

## 2008-05-26 HISTORY — PX: TRACHEOSTOMY CLOSURE: SHX458

## 2008-05-31 ENCOUNTER — Emergency Department (HOSPITAL_COMMUNITY): Admission: EM | Admit: 2008-05-31 | Discharge: 2008-06-01 | Payer: Self-pay | Admitting: Emergency Medicine

## 2008-06-16 ENCOUNTER — Inpatient Hospital Stay (HOSPITAL_COMMUNITY): Admission: EM | Admit: 2008-06-16 | Discharge: 2008-07-18 | Payer: Self-pay | Admitting: Emergency Medicine

## 2008-06-16 ENCOUNTER — Ambulatory Visit: Payer: Self-pay | Admitting: Internal Medicine

## 2008-06-16 ENCOUNTER — Ambulatory Visit: Payer: Self-pay | Admitting: Emergency Medicine

## 2008-06-16 ENCOUNTER — Telehealth (INDEPENDENT_AMBULATORY_CARE_PROVIDER_SITE_OTHER): Payer: Self-pay | Admitting: *Deleted

## 2008-07-03 ENCOUNTER — Ambulatory Visit: Payer: Self-pay | Admitting: Vascular Surgery

## 2008-07-03 ENCOUNTER — Encounter: Payer: Self-pay | Admitting: Emergency Medicine

## 2008-07-04 ENCOUNTER — Encounter: Payer: Self-pay | Admitting: Internal Medicine

## 2008-07-18 ENCOUNTER — Ambulatory Visit: Payer: Self-pay | Admitting: Internal Medicine

## 2008-07-18 ENCOUNTER — Inpatient Hospital Stay: Admission: AD | Admit: 2008-07-18 | Discharge: 2008-08-25 | Payer: Self-pay | Admitting: Internal Medicine

## 2008-08-30 ENCOUNTER — Telehealth (INDEPENDENT_AMBULATORY_CARE_PROVIDER_SITE_OTHER): Payer: Self-pay | Admitting: *Deleted

## 2008-09-04 ENCOUNTER — Encounter: Payer: Self-pay | Admitting: Critical Care Medicine

## 2008-09-07 ENCOUNTER — Encounter: Payer: Self-pay | Admitting: Critical Care Medicine

## 2008-09-11 ENCOUNTER — Ambulatory Visit: Payer: Self-pay | Admitting: Critical Care Medicine

## 2008-09-28 ENCOUNTER — Ambulatory Visit: Payer: Self-pay | Admitting: Critical Care Medicine

## 2008-10-10 ENCOUNTER — Encounter: Payer: Self-pay | Admitting: Critical Care Medicine

## 2008-10-14 IMAGING — CR DG CHEST 2V
2 series · 2 of 2 positions shown · non-contrast
Comparison: 07/18/06.

CLINICAL DATA: Cough. 
 CHEST - 2 VIEW ? 02/24/07:

[view not recorded (1 of 2)]
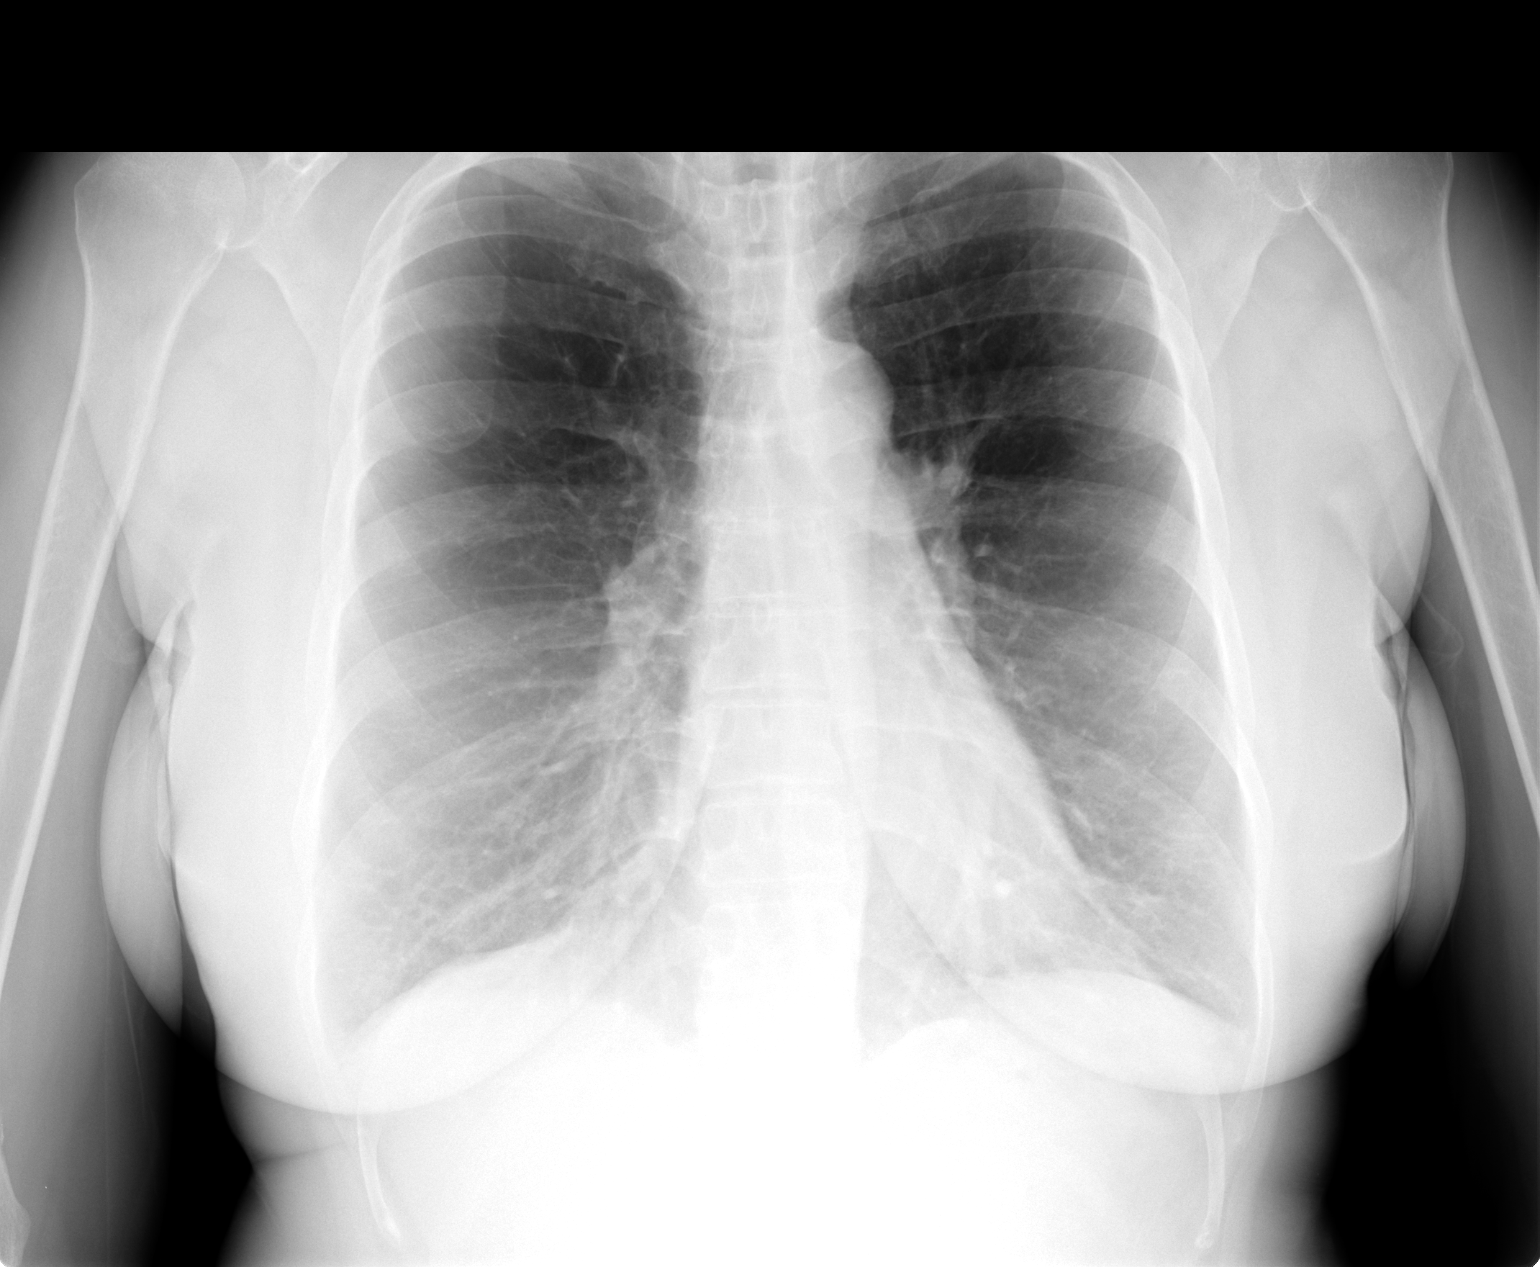

[view not recorded (2 of 2)]
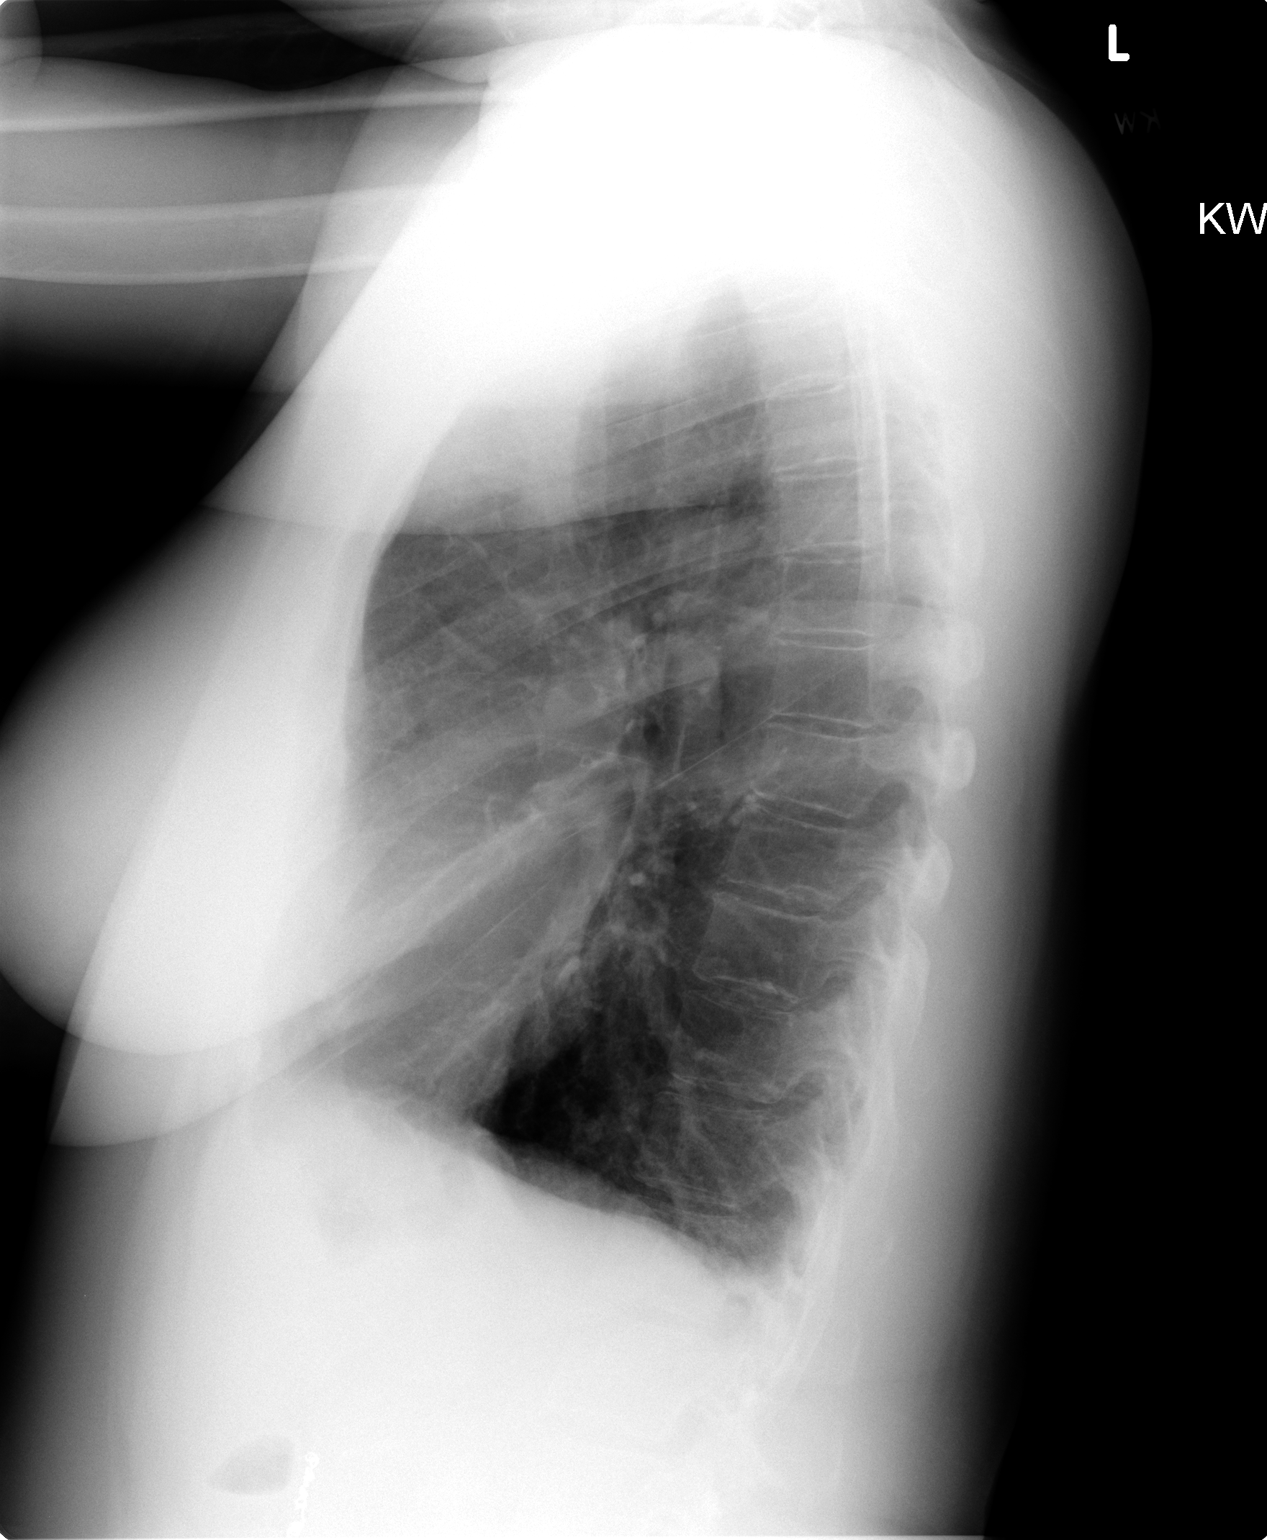

[2 of 2 positions shown; findings below may reference images not displayed]

FINDINGS: The heart size and vascularity are normal.  There is some minimal accentuation of the interstitial markings at the bases which is new and may represent some mild atelectasis. The nodule seen in the right upper lobe on the chest CT of 04/22/04 is no longer visible and has been resected.  
 There is some peribronchial thickening on the lateral view and the lungs appear slightly hyperinflated.
IMPRESSION: COPD with mild bronchitic changes.

## 2008-10-17 ENCOUNTER — Ambulatory Visit: Payer: Self-pay | Admitting: Critical Care Medicine

## 2008-10-18 ENCOUNTER — Telehealth (INDEPENDENT_AMBULATORY_CARE_PROVIDER_SITE_OTHER): Payer: Self-pay | Admitting: *Deleted

## 2008-11-22 ENCOUNTER — Ambulatory Visit (HOSPITAL_COMMUNITY): Admission: RE | Admit: 2008-11-22 | Discharge: 2008-11-22 | Payer: Self-pay | Admitting: Obstetrics and Gynecology

## 2008-11-24 ENCOUNTER — Encounter: Payer: Self-pay | Admitting: Obstetrics and Gynecology

## 2008-11-24 ENCOUNTER — Ambulatory Visit: Payer: Self-pay | Admitting: Obstetrics and Gynecology

## 2008-11-24 ENCOUNTER — Other Ambulatory Visit: Admission: RE | Admit: 2008-11-24 | Discharge: 2008-11-24 | Payer: Self-pay | Admitting: Obstetrics and Gynecology

## 2008-12-13 IMAGING — CR DG CHEST 2V
2 series · 2 of 2 positions shown · non-contrast
Comparison: 02/24/2007

CLINICAL DATA: 56-year-old female, cough, shortness of breath, COPD, asthma.    
 CHEST - 2 VIEW:

[w chest pa]
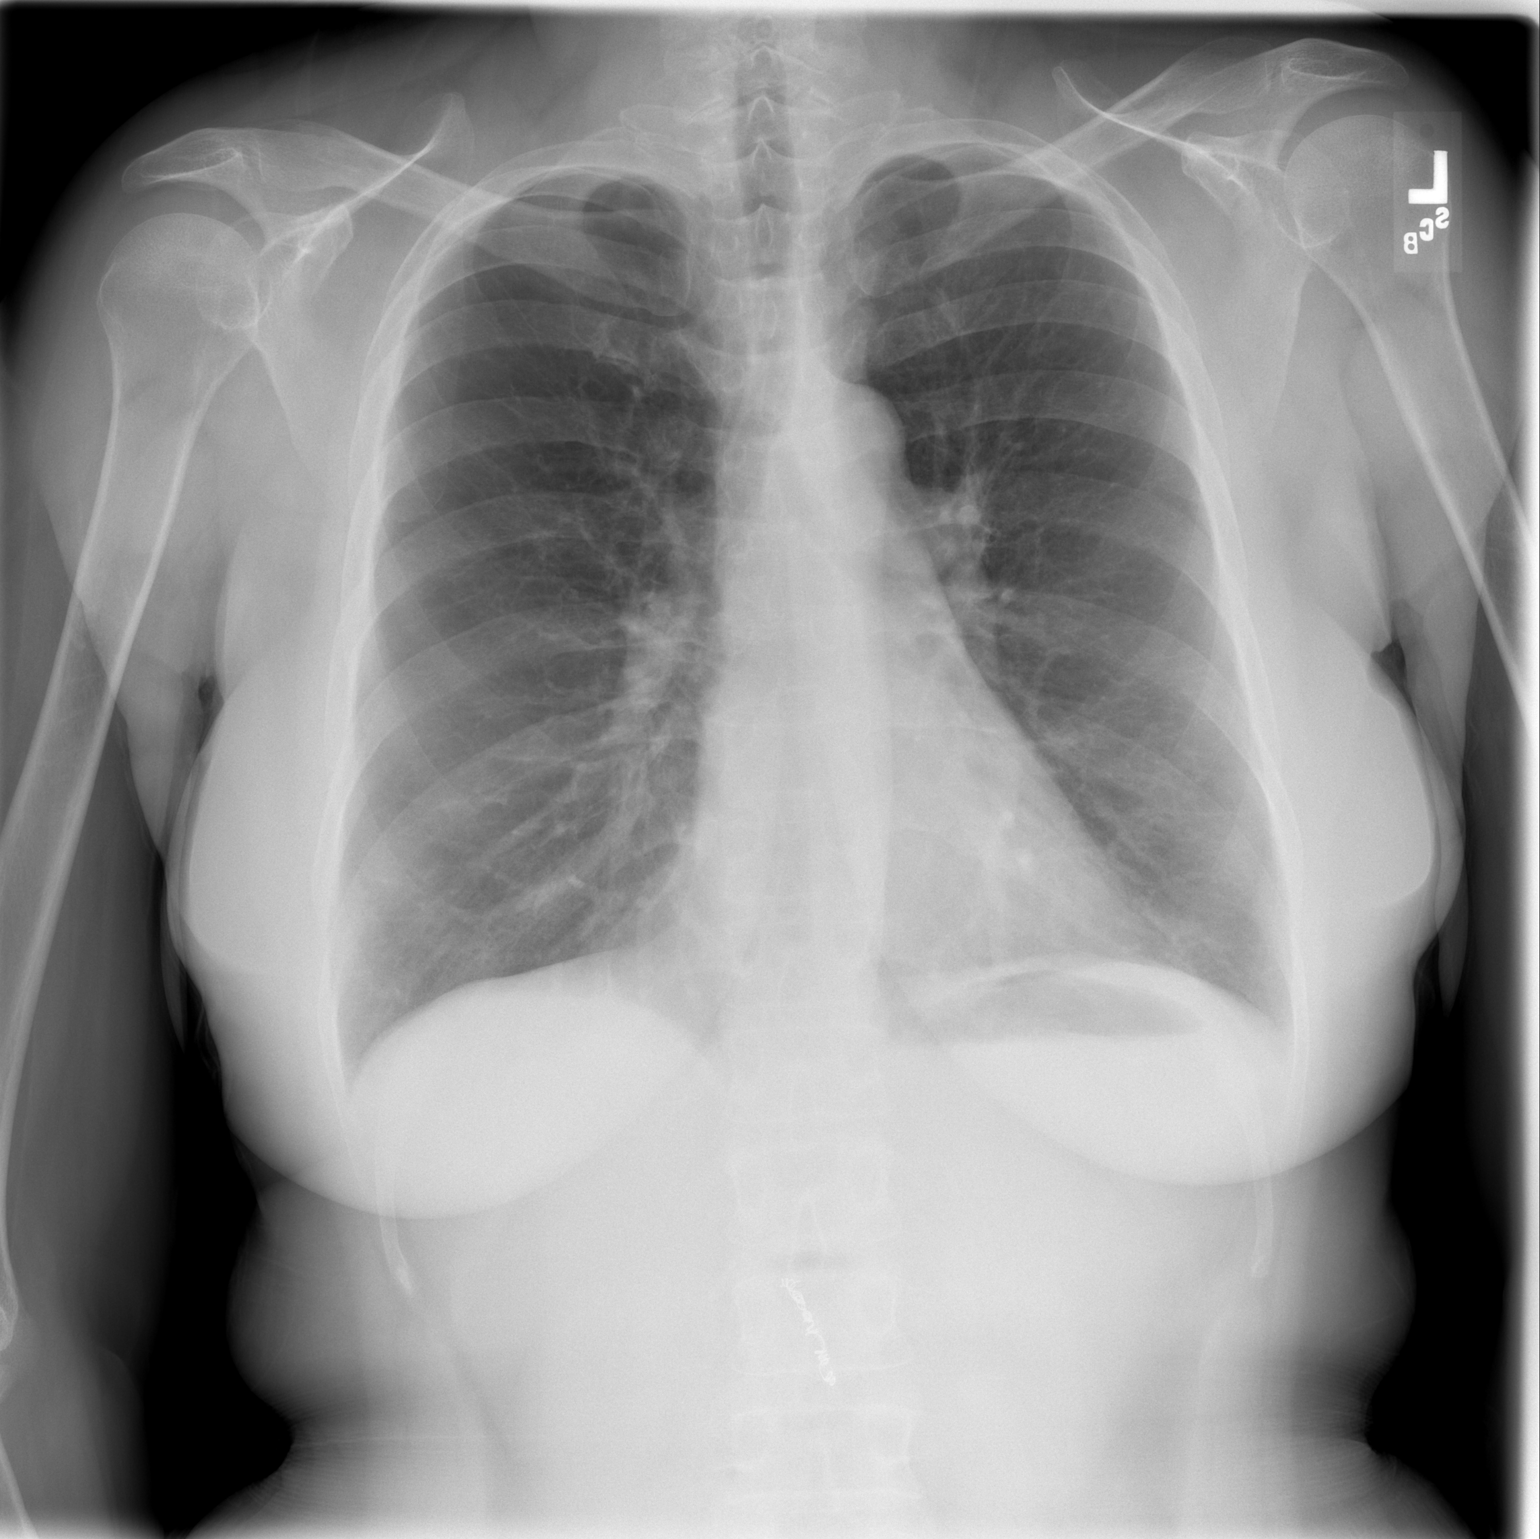

[w chest lat]
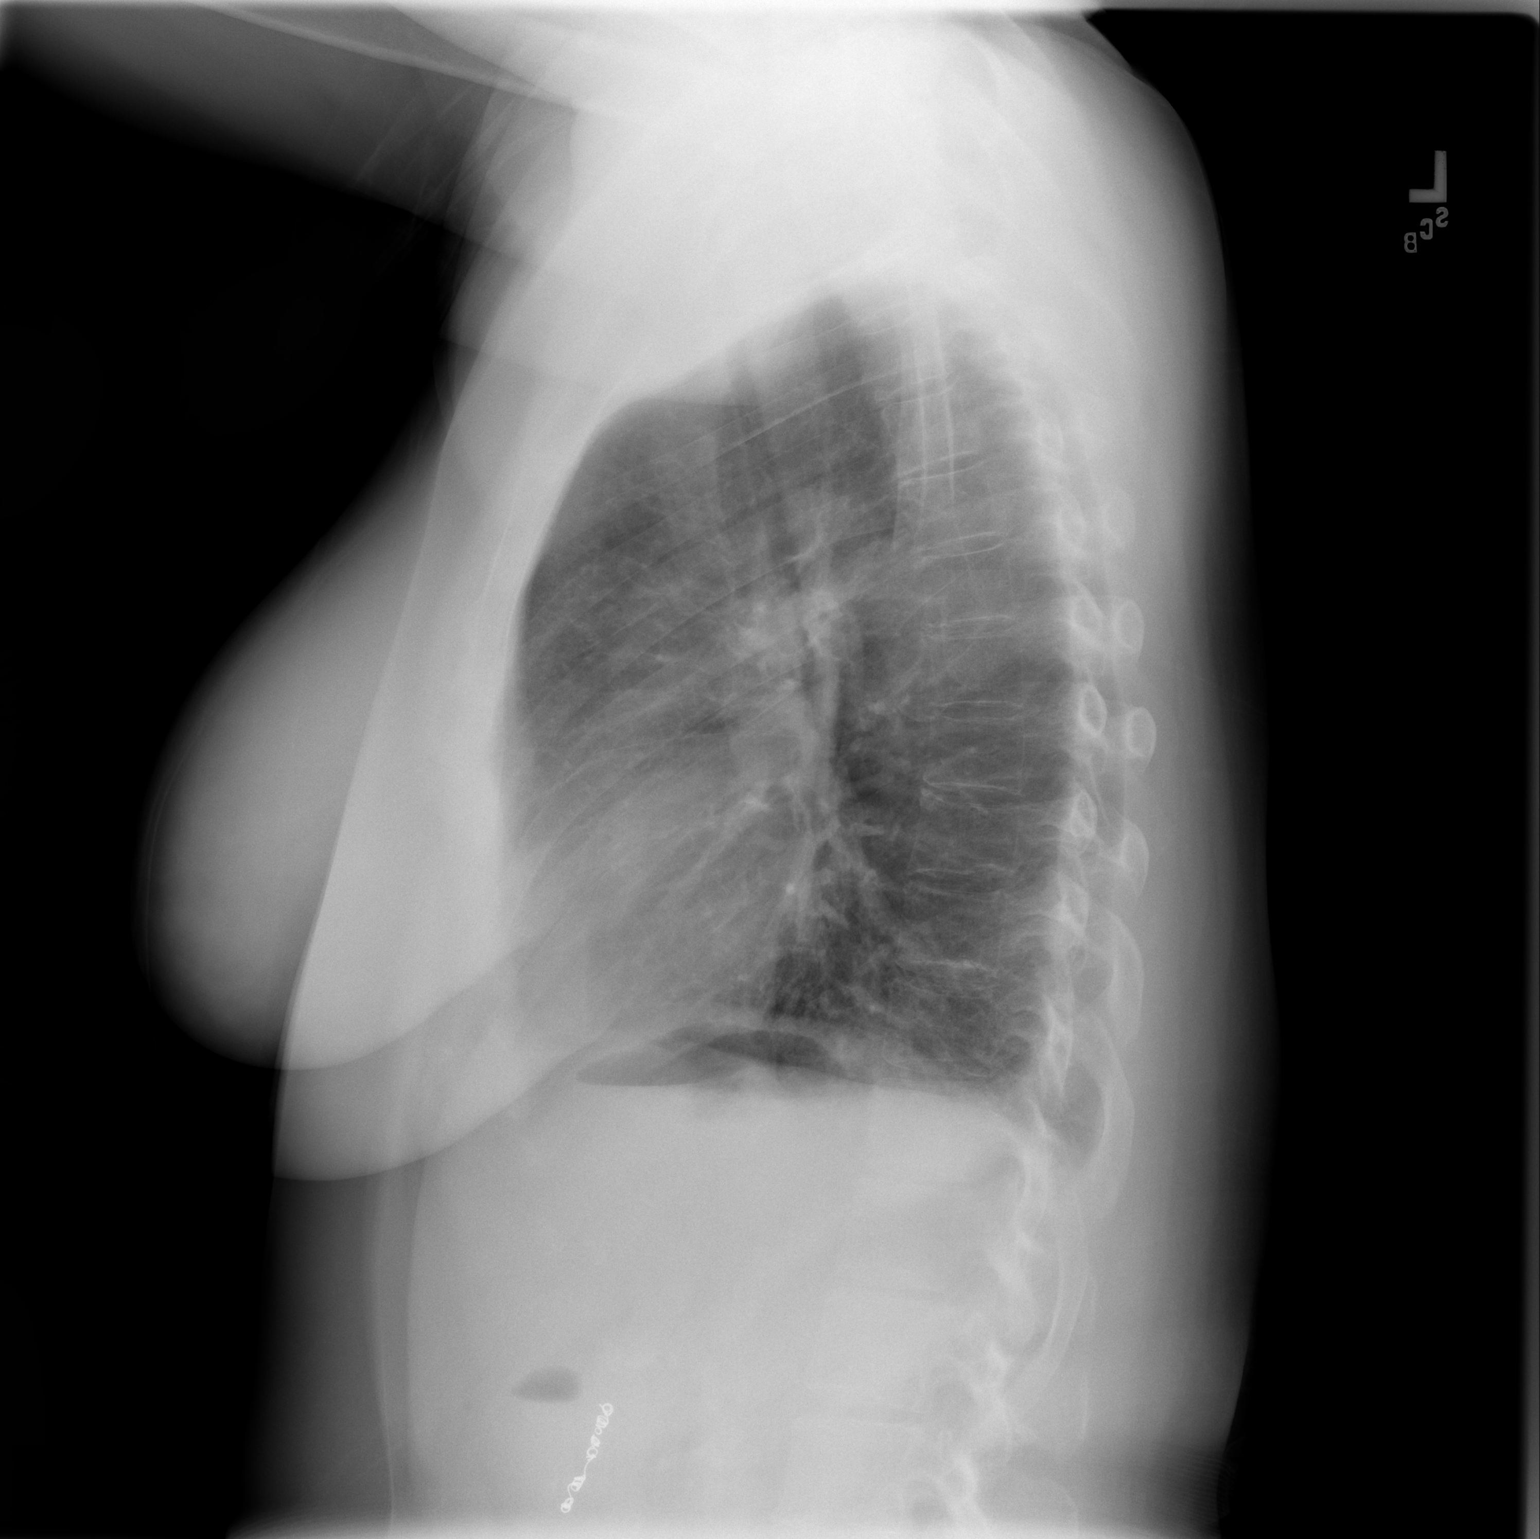

[2 of 2 positions shown; findings below may reference images not displayed]

FINDINGS: Mild hyperinflation and prominent basilar vascular markings.  No acute airspace process, edema, effusion, or pneumothorax.  Vascular coils are present in the mid-abdomen. Mild chronic bronchial thickening.
IMPRESSION: 1.  Chronic bronchial thickening and hyperinflation suspicious for COPD. 
 2.  Stable exam without acute airspace process.

## 2008-12-18 ENCOUNTER — Encounter: Payer: Self-pay | Admitting: Critical Care Medicine

## 2008-12-19 ENCOUNTER — Telehealth (INDEPENDENT_AMBULATORY_CARE_PROVIDER_SITE_OTHER): Payer: Self-pay | Admitting: *Deleted

## 2008-12-19 ENCOUNTER — Ambulatory Visit: Payer: Self-pay | Admitting: Critical Care Medicine

## 2008-12-19 DIAGNOSIS — E119 Type 2 diabetes mellitus without complications: Secondary | ICD-10-CM | POA: Insufficient documentation

## 2008-12-26 ENCOUNTER — Encounter: Payer: Self-pay | Admitting: Critical Care Medicine

## 2008-12-26 ENCOUNTER — Telehealth: Payer: Self-pay | Admitting: Critical Care Medicine

## 2008-12-27 ENCOUNTER — Telehealth: Payer: Self-pay | Admitting: Critical Care Medicine

## 2009-01-01 ENCOUNTER — Encounter: Payer: Self-pay | Admitting: Critical Care Medicine

## 2009-01-02 ENCOUNTER — Encounter: Payer: Self-pay | Admitting: Critical Care Medicine

## 2009-01-08 ENCOUNTER — Telehealth: Payer: Self-pay | Admitting: Critical Care Medicine

## 2009-02-24 IMAGING — CR DG CHEST 2V
2 series · 2 of 2 positions shown · non-contrast
Comparison: 04/25/07.

CLINICAL DATA: Precardiac catheterization.  Arrhythmia.  Shortness of breath.  Chest pain. 
 CHEST ? 2 VIEW:

[view not recorded (1 of 2)]
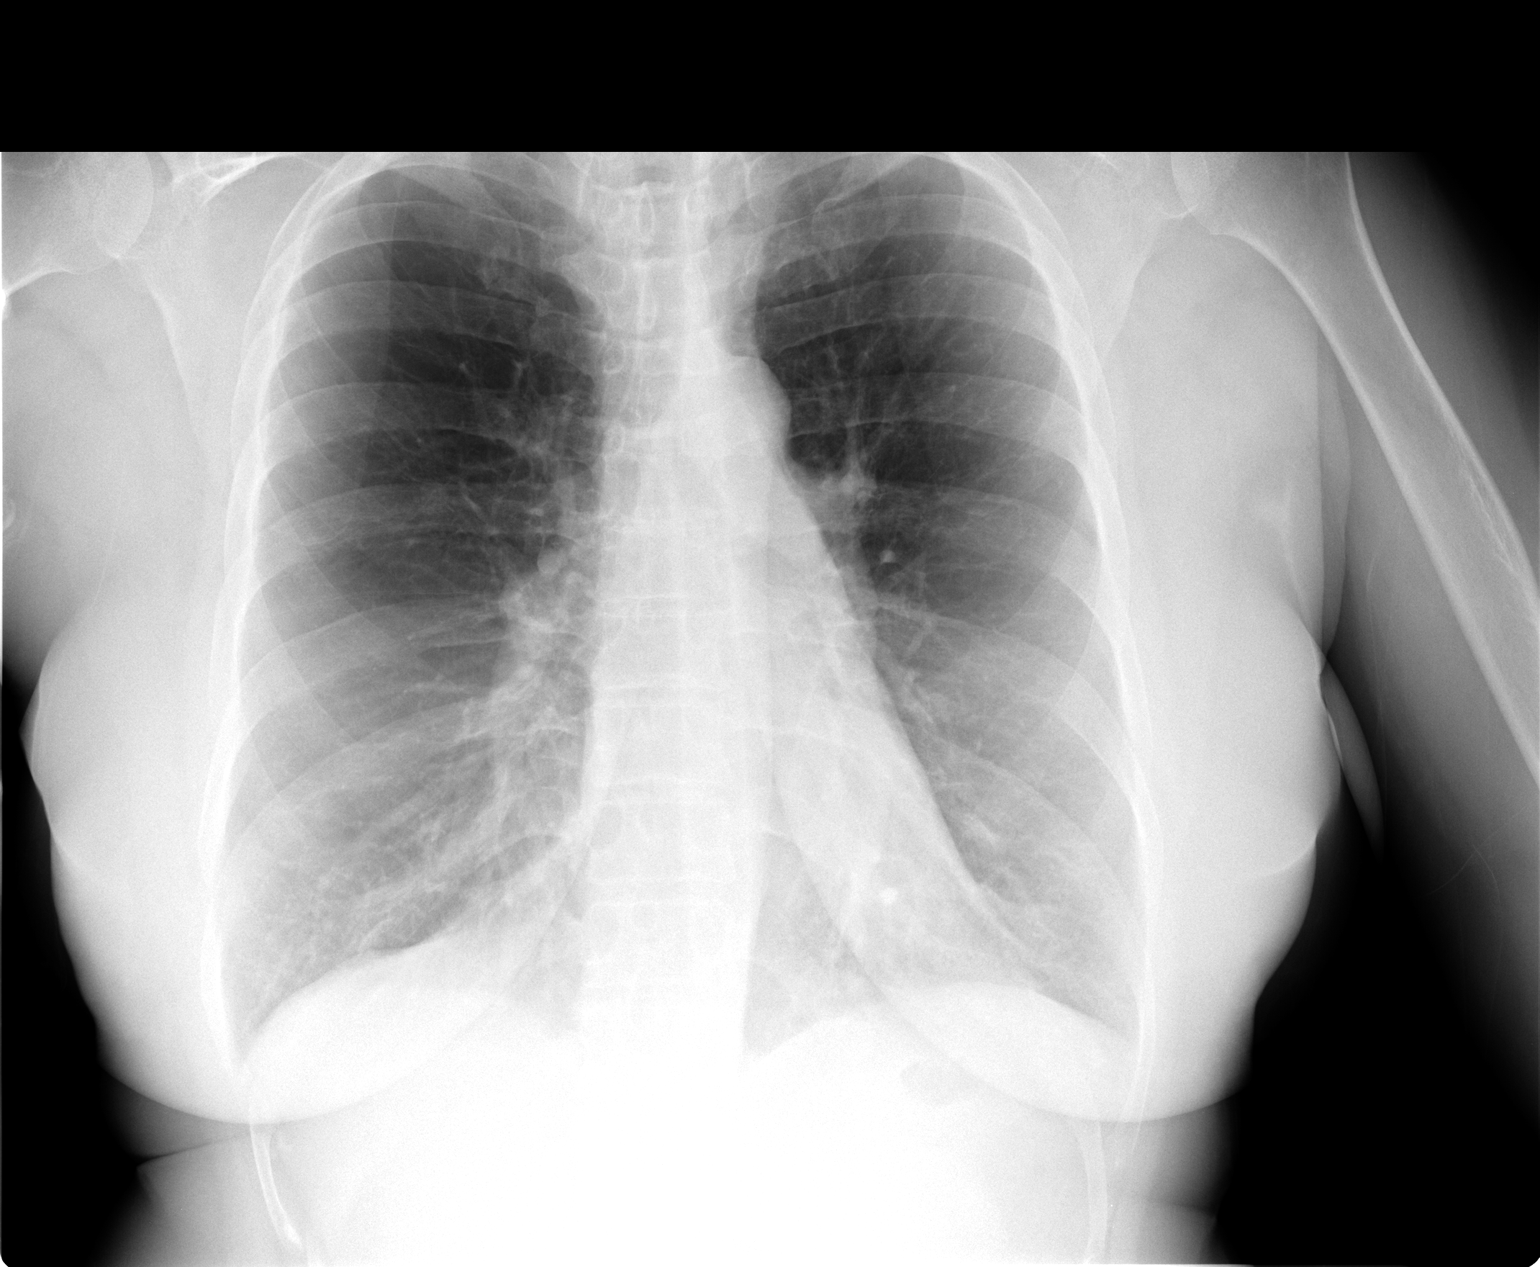

[view not recorded (2 of 2)]
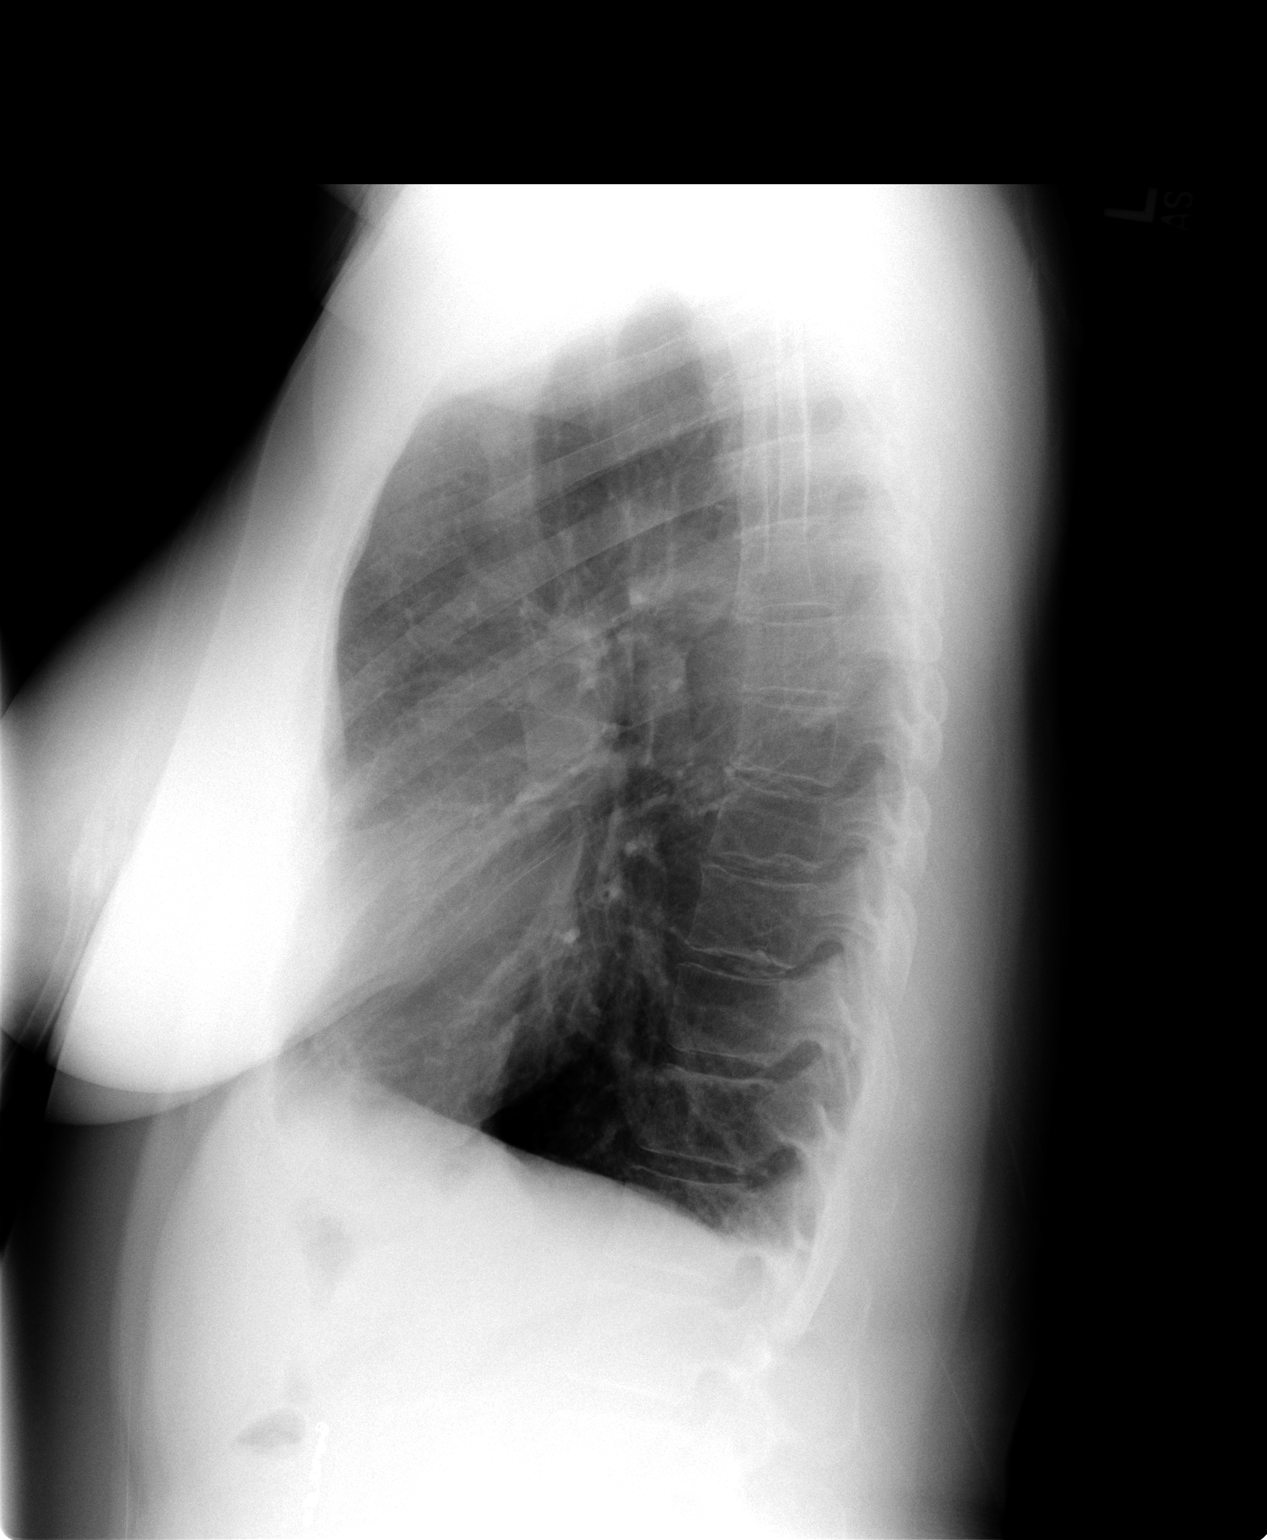

[2 of 2 positions shown; findings below may reference images not displayed]

FINDINGS: Again noted is a faint slightly irregular nodule in the right midlung zone which has been previously demonstrated on the CT scan to be spiculated.  That CT scan was on 04/22/04 and compared with the prior chest x-ray dated 04/01/04, the nodule is essentially unchanged.  Has this been previously biopsied?  
 The heart size and vascularity are normal.  The lungs are somewhat hyperinflated with flattening of the diaphragm suggestive of COPD.  There is some chronic peribronchial thickening.
IMPRESSION: COPD.  Nodule in the right midzone appears stable since 04/01/04.

## 2009-02-26 ENCOUNTER — Ambulatory Visit: Payer: Self-pay | Admitting: Critical Care Medicine

## 2009-03-09 ENCOUNTER — Telehealth: Payer: Self-pay | Admitting: Critical Care Medicine

## 2009-03-13 ENCOUNTER — Encounter: Payer: Self-pay | Admitting: Critical Care Medicine

## 2009-03-21 ENCOUNTER — Encounter: Payer: Self-pay | Admitting: Critical Care Medicine

## 2009-05-01 ENCOUNTER — Ambulatory Visit: Payer: Self-pay | Admitting: Critical Care Medicine

## 2009-05-02 ENCOUNTER — Encounter: Payer: Self-pay | Admitting: Critical Care Medicine

## 2009-05-03 IMAGING — CR DG CHEST 2V
2 series · 2 of 2 positions shown · non-contrast
Comparison: 07/07/2007.

CLINICAL DATA: Shortness of breath, wheezing, productive cough.

CHEST - 2 VIEW

[view not recorded (1 of 2)]
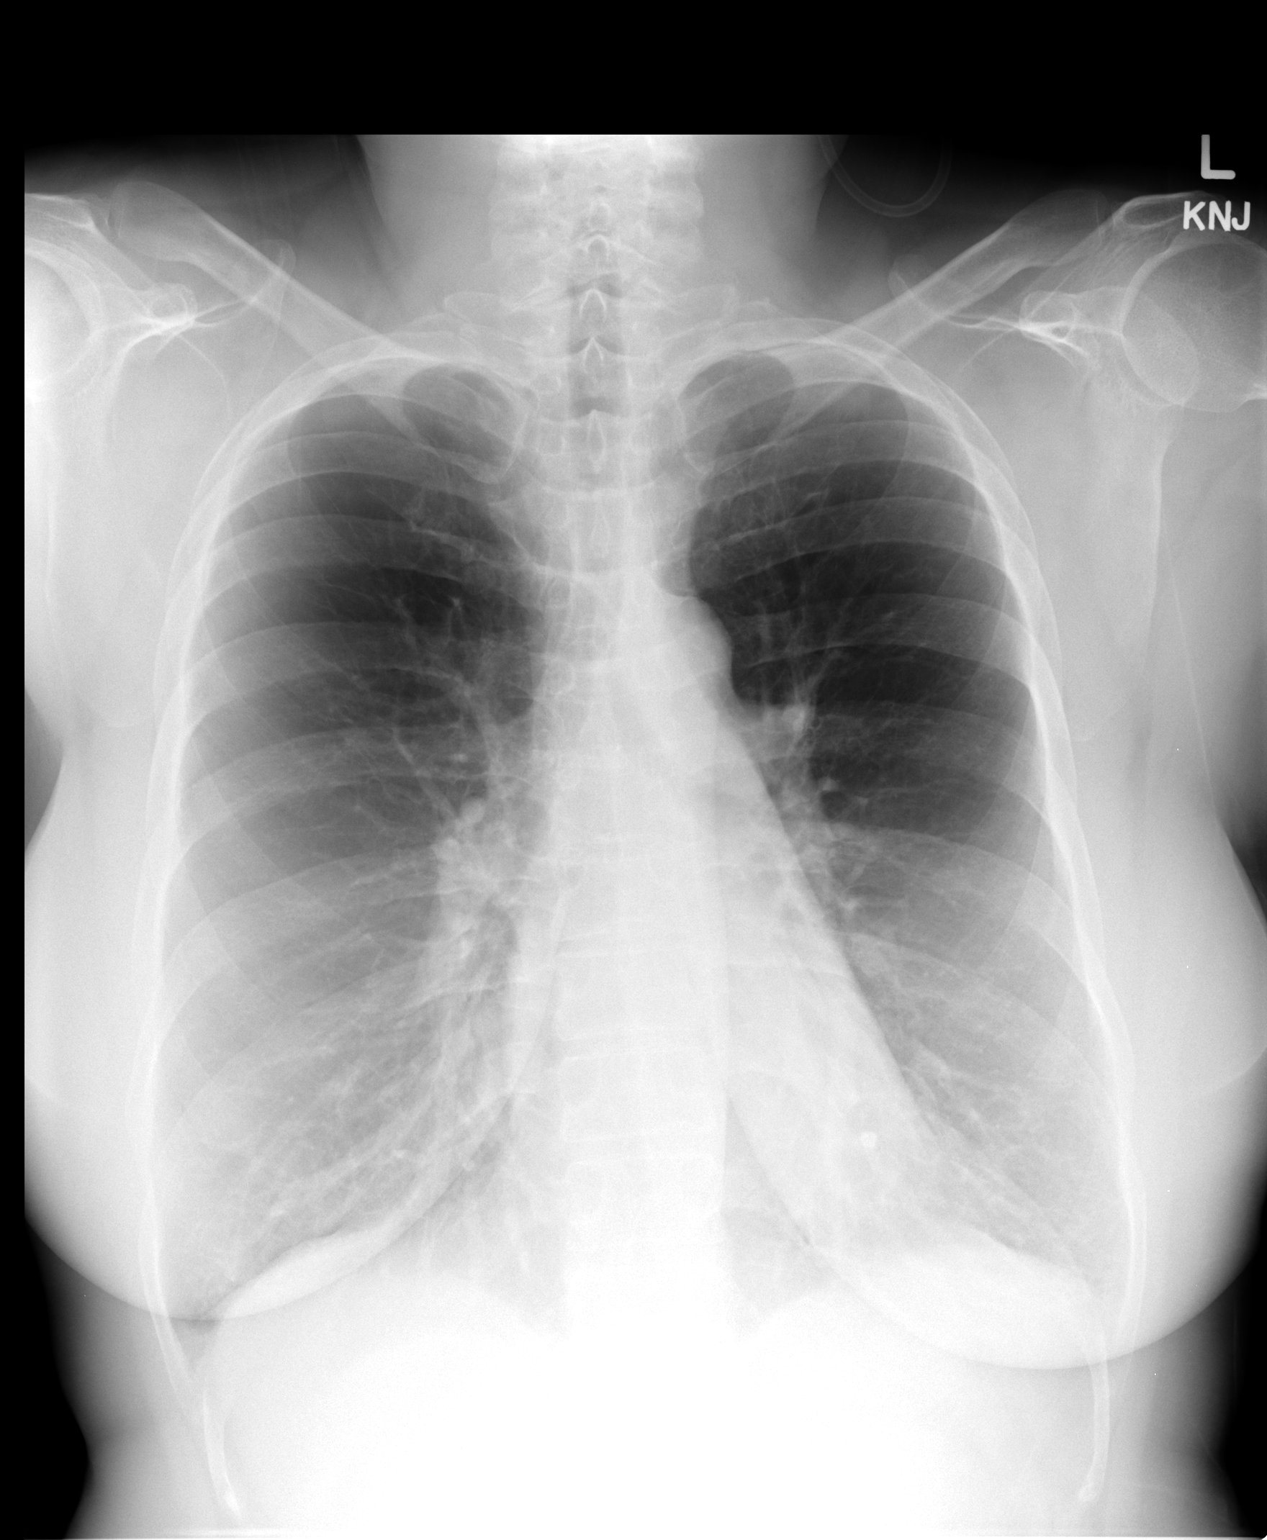

[view not recorded (2 of 2)]
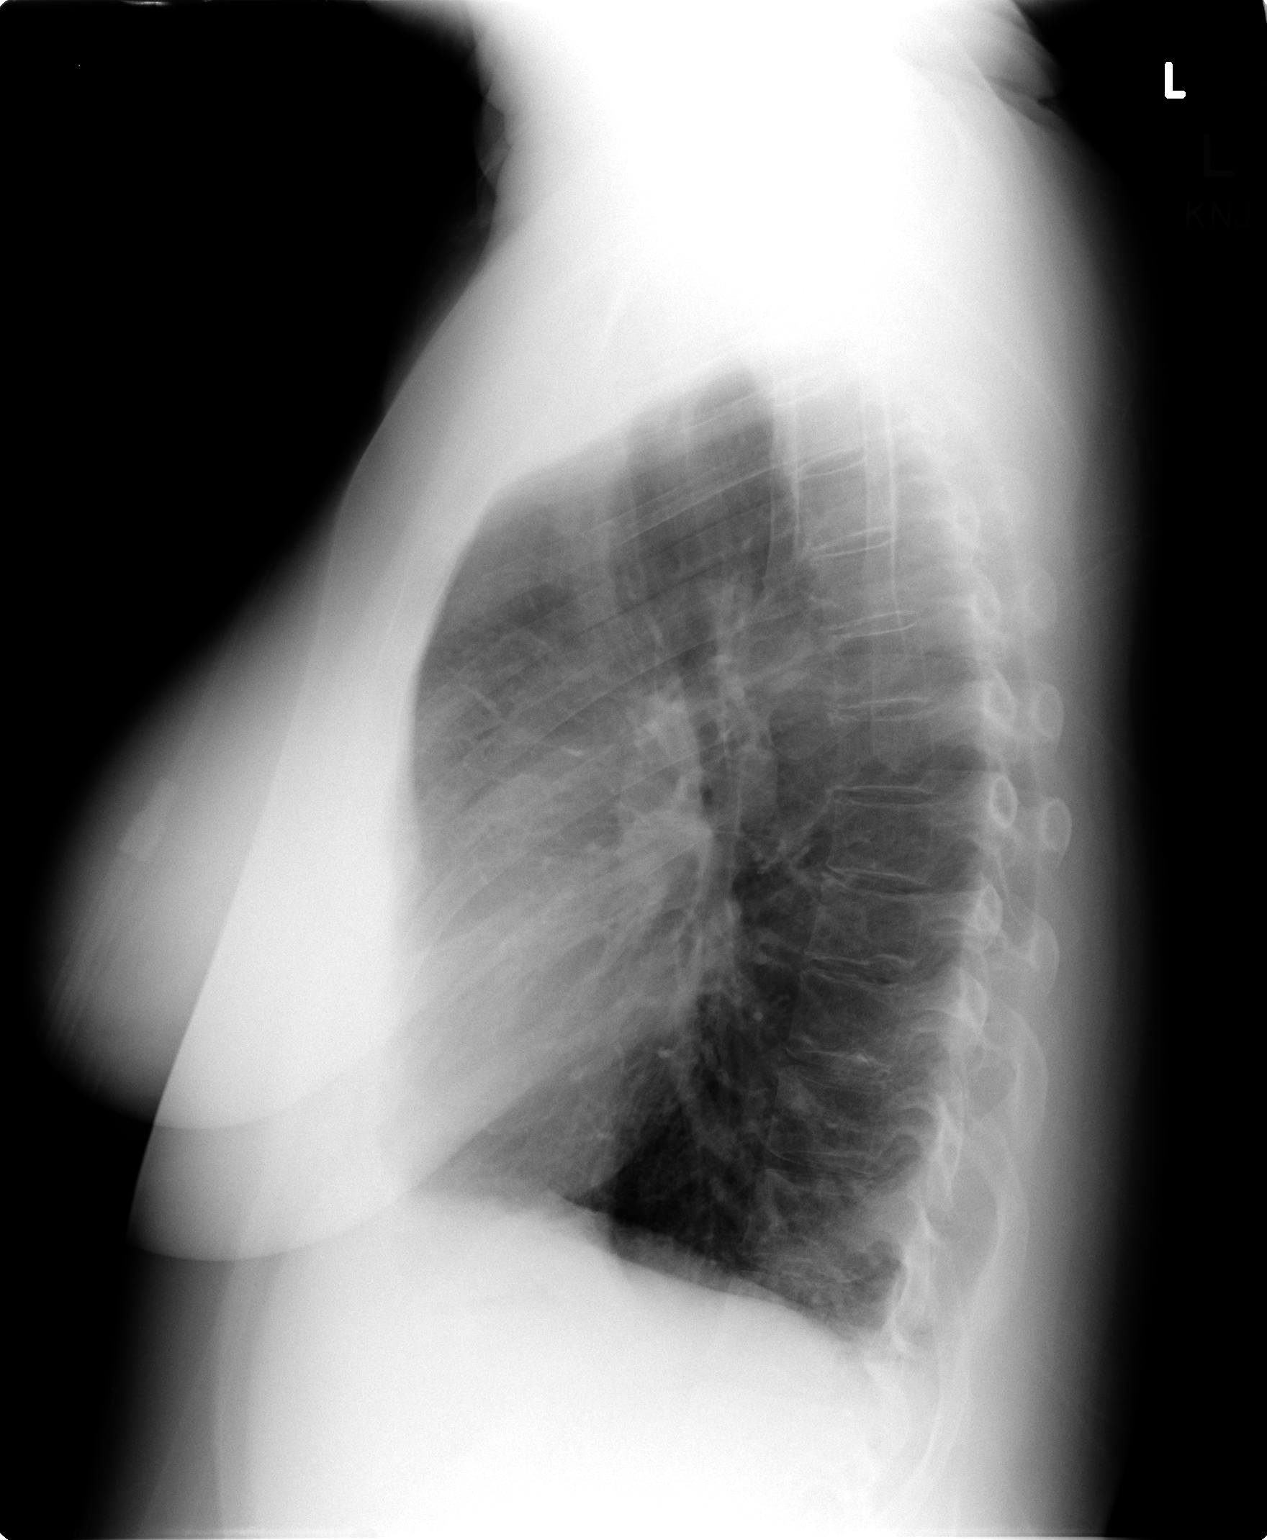

[2 of 2 positions shown; findings below may reference images not displayed]

FINDINGS: Trachea is midline.  Heart size normal.  Small nodular
density in the right mid lung zone is again seen.  Lungs otherwise
clear.  No pleural fluid.
IMPRESSION: 1.  No acute findings.
2.  Stable right upper lobe nodule.

## 2009-05-08 ENCOUNTER — Ambulatory Visit: Payer: Self-pay | Admitting: Obstetrics and Gynecology

## 2009-05-08 ENCOUNTER — Telehealth: Payer: Self-pay | Admitting: Critical Care Medicine

## 2009-06-13 ENCOUNTER — Encounter: Admission: RE | Admit: 2009-06-13 | Discharge: 2009-09-11 | Payer: Self-pay | Admitting: Internal Medicine

## 2009-06-14 ENCOUNTER — Encounter: Payer: Self-pay | Admitting: Critical Care Medicine

## 2009-07-05 IMAGING — CR DG LUMBAR SPINE COMPLETE 4+V
5 series · 5 of 5 positions shown · non-contrast
Comparison: Abdominal radiograph dated 05/06/2006

CLINICAL DATA: Low back and right side pelvis and right leg pain
since last night

LUMBAR SPINE - COMPLETE 4+ VIEW,

[t l-spine a.p.]
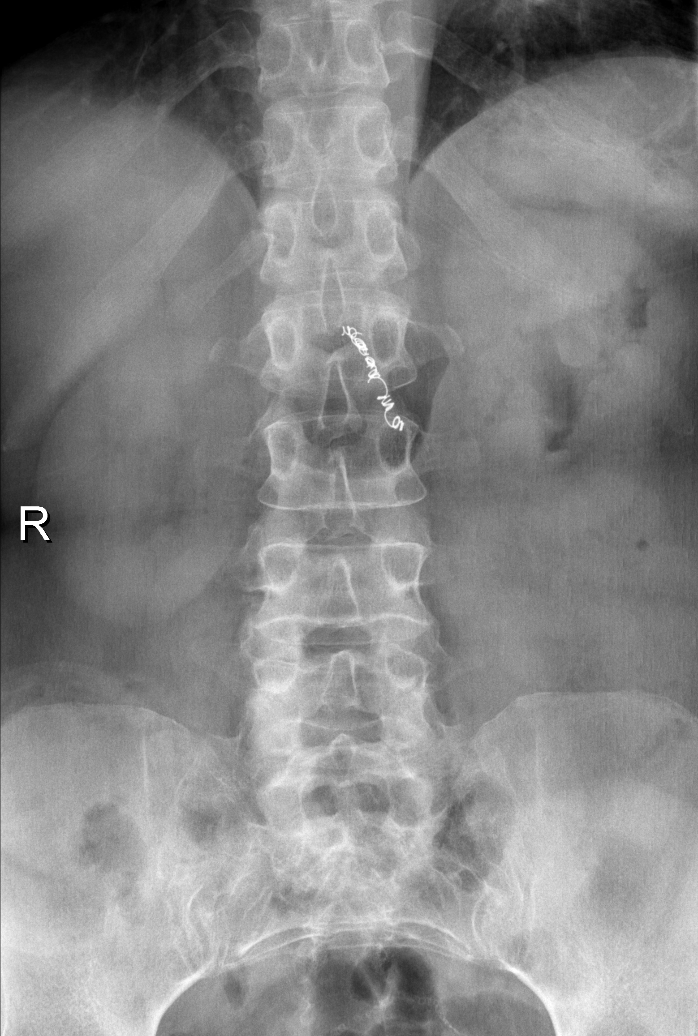

[t l-spine oblique exposure (1 of 2)]
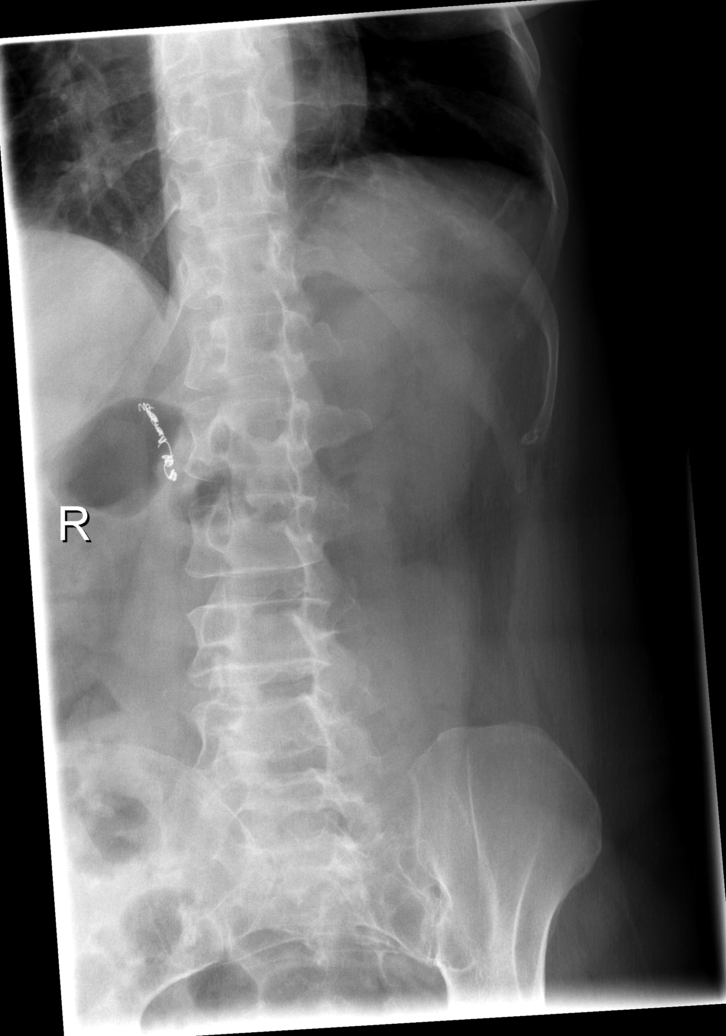

[t l-spine oblique exposure (2 of 2)]
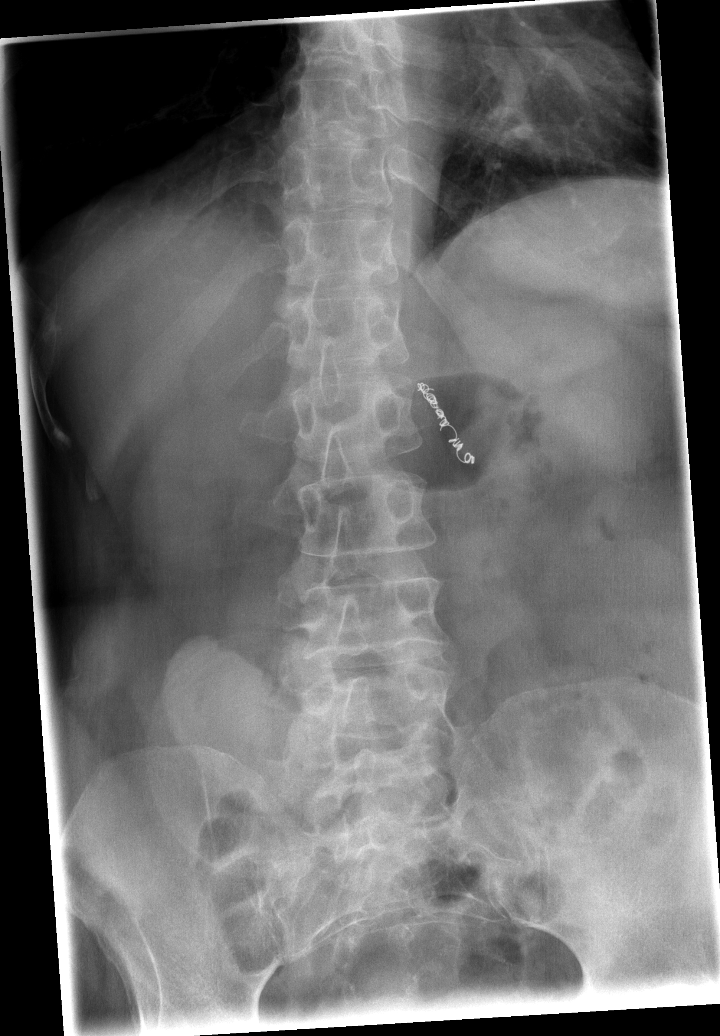

[t l-spine lat]
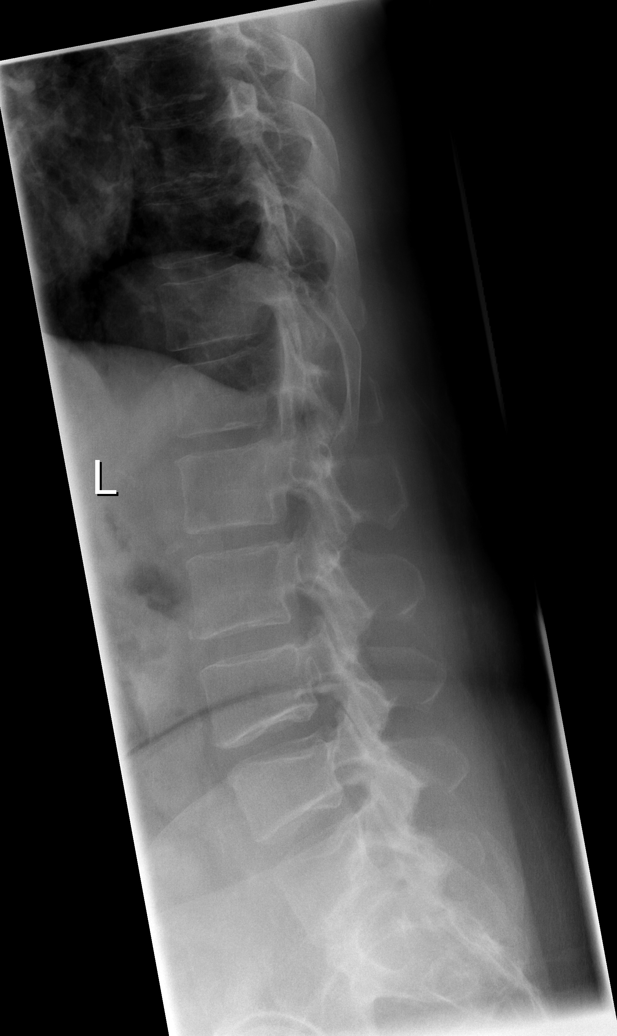

[t l-spine l5-s1 spot *]
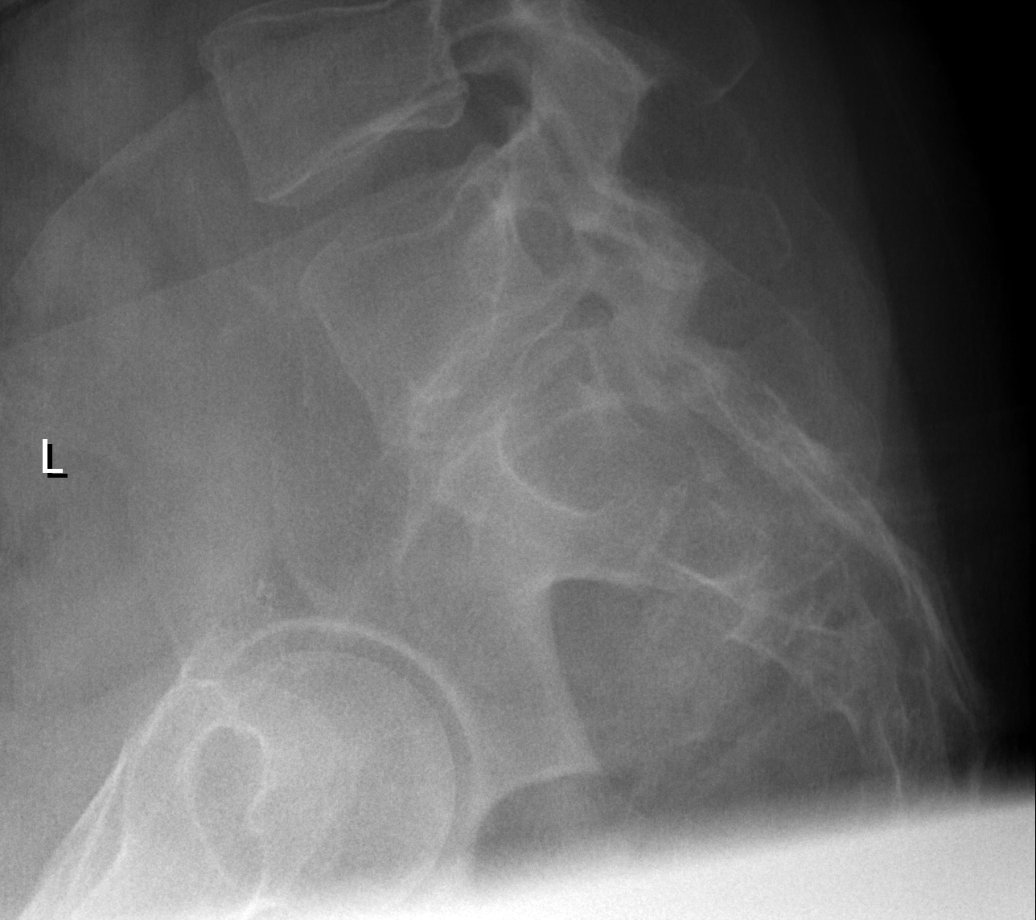

[5 of 5 positions shown; findings below may reference images not displayed]

FINDINGS: The patient has four typical lumbar segments.  The L5
segment appears to be congenitally fused to the sacrum.  There is a
vestigial disc at L5-S1.  There are subtle Schmorl's nodes in the
endplates at L3-4 and L4-5.  No spondylolisthesis or spondylolysis.
The patient has  moderate facet joint arthritis at L3-4 on the
right and to a lesser degree at L4-5 on the right.
IMPRESSION: Degenerative disc disease at L3-4 and L4-5.  Degenerative facet
joint arthritis at L3-4 and L4-5 on the right.  The L5 vertebra
appears to be fused to the sacrum although there is disc space at
L5- S1.

PELVIS - 1-2 VIEW
FINDINGS: A single AP view of the pelvis demonstrates no
significant abnormality of the bony structures of the pelvis
including both hips.  The patient has prominent transverse
processes at L5 which appear articulate with the sacrum.
IMPRESSION: No significant abnormality of the pelvis.

## 2009-07-05 IMAGING — CR DG PELVIS 1-2V
1 series · 1 of 1 positions shown · non-contrast
Comparison: Abdominal radiograph dated 05/06/2006

CLINICAL DATA: Low back and right side pelvis and right leg pain
since last night

LUMBAR SPINE - COMPLETE 4+ VIEW,

[t pelvis a.p.]
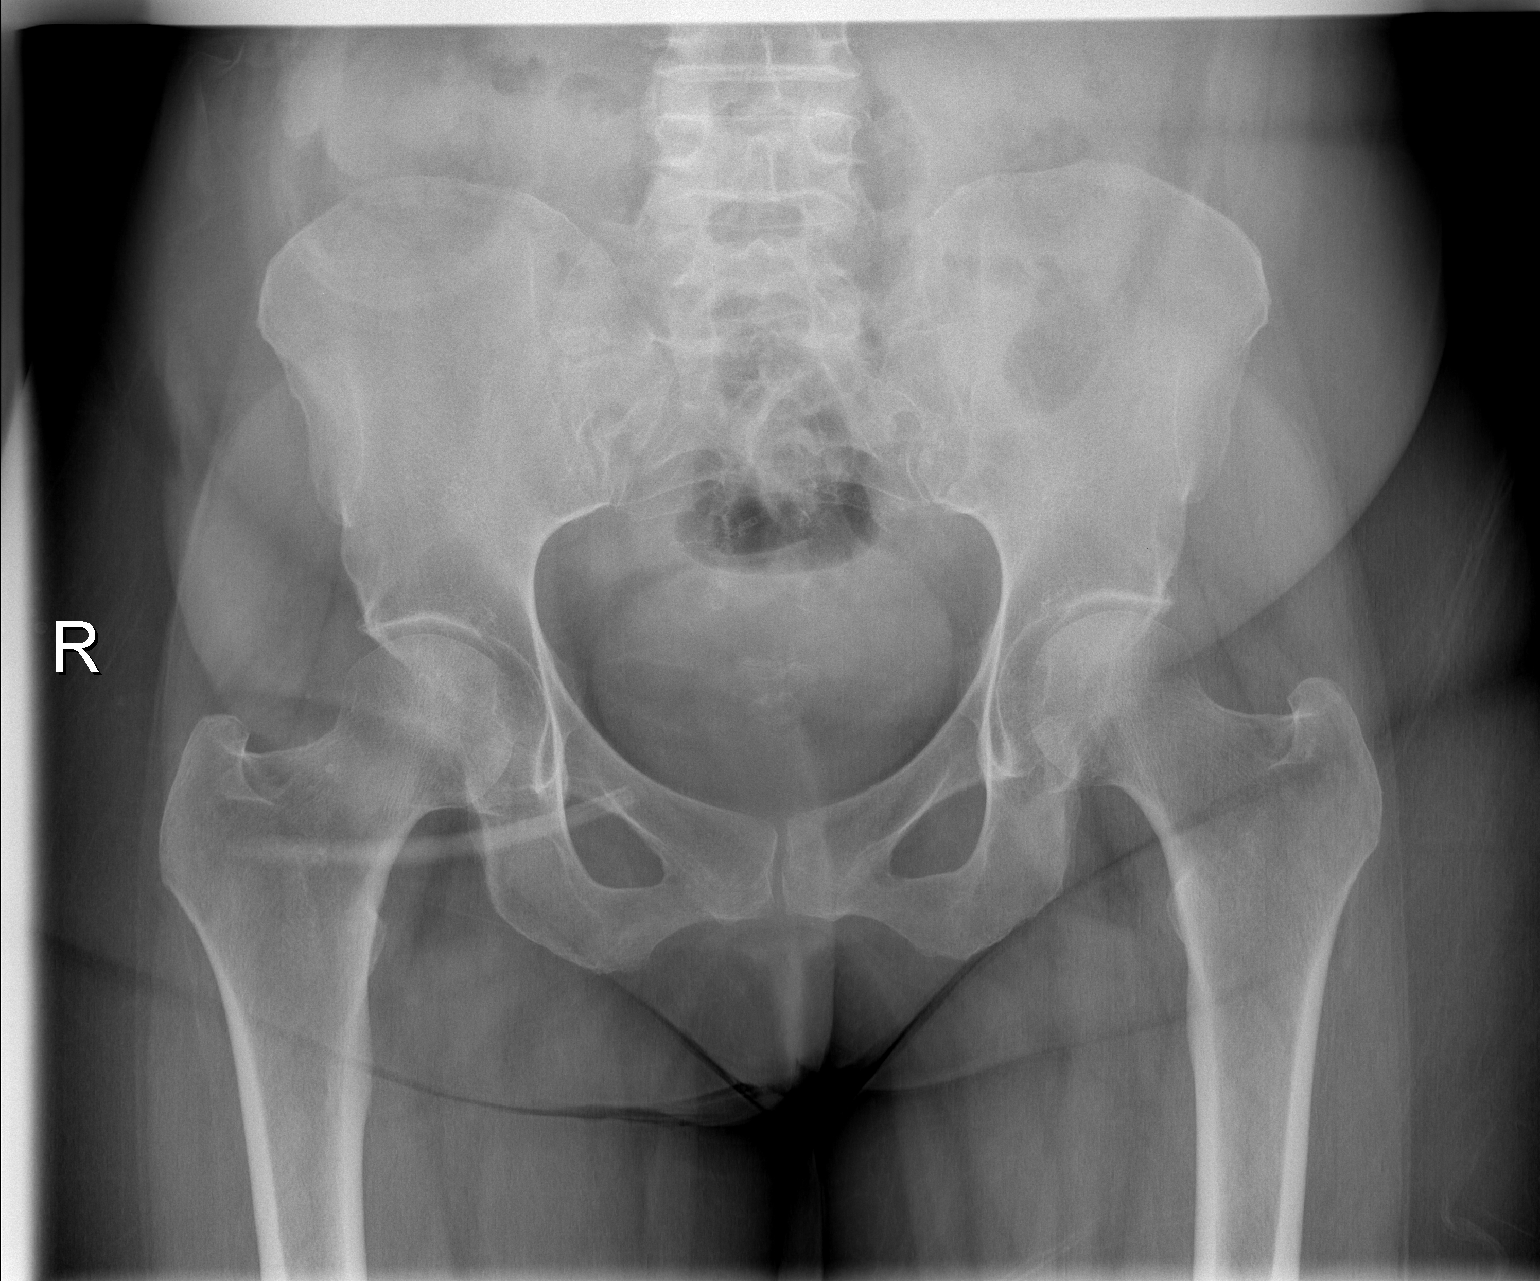

[1 of 1 positions shown; findings below may reference images not displayed]

FINDINGS: The patient has four typical lumbar segments.  The L5
segment appears to be congenitally fused to the sacrum.  There is a
vestigial disc at L5-S1.  There are subtle Schmorl's nodes in the
endplates at L3-4 and L4-5.  No spondylolisthesis or spondylolysis.
The patient has  moderate facet joint arthritis at L3-4 on the
right and to a lesser degree at L4-5 on the right.
IMPRESSION: Degenerative disc disease at L3-4 and L4-5.  Degenerative facet
joint arthritis at L3-4 and L4-5 on the right.  The L5 vertebra
appears to be fused to the sacrum although there is disc space at
L5- S1.

PELVIS - 1-2 VIEW
FINDINGS: A single AP view of the pelvis demonstrates no
significant abnormality of the bony structures of the pelvis
including both hips.  The patient has prominent transverse
processes at L5 which appear articulate with the sacrum.
IMPRESSION: No significant abnormality of the pelvis.

## 2009-07-11 ENCOUNTER — Ambulatory Visit: Payer: Self-pay | Admitting: Critical Care Medicine

## 2009-07-15 IMAGING — CT CT ABDOMEN W/O CM
2 of 4 series · 17 of 46 positions shown, 19 images · non-contrast
Comparison: 01/02/2006

CT ABDOMEN

CLINICAL DATA: Right flank pain and hematuria.  Crohn disease.

CT ABDOMEN AND PELVIS WITHOUT CONTRAST
TECHNIQUE: Multidetector CT imaging of the abdomen and pelvis was
performed following the standard
protocol without intravenous contrast.

[Series 2: 160 stone 5.0 b40f st · axial · 0.53mm/px · z∈[-318,-23]mm · 14 of 65 slices shown, 16 images]
[im 3/65  soft-tissue]
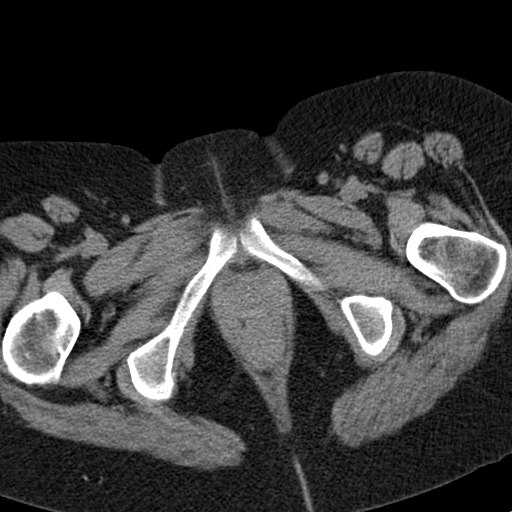
[im 3/65  bone]
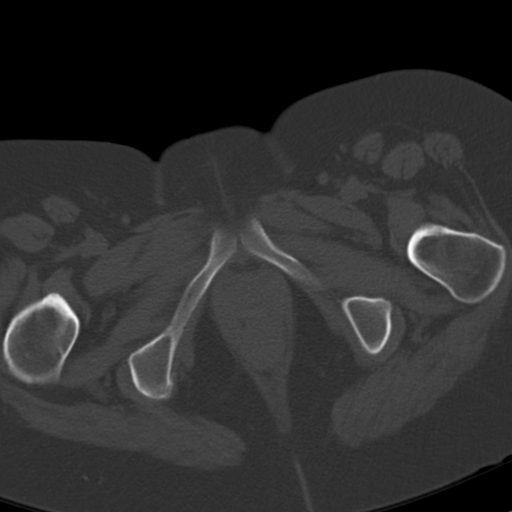
[im 8/65  soft-tissue]
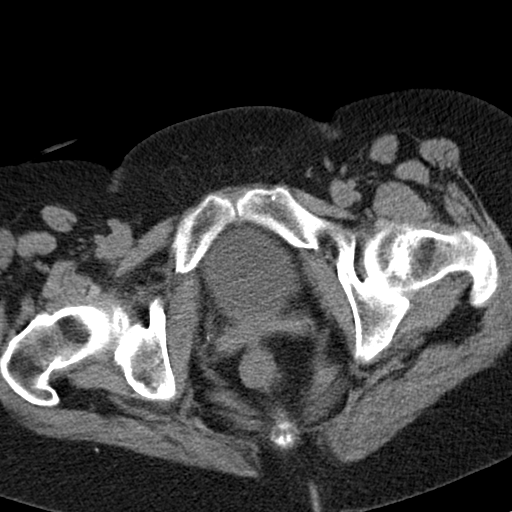
[im 13/65  soft-tissue]
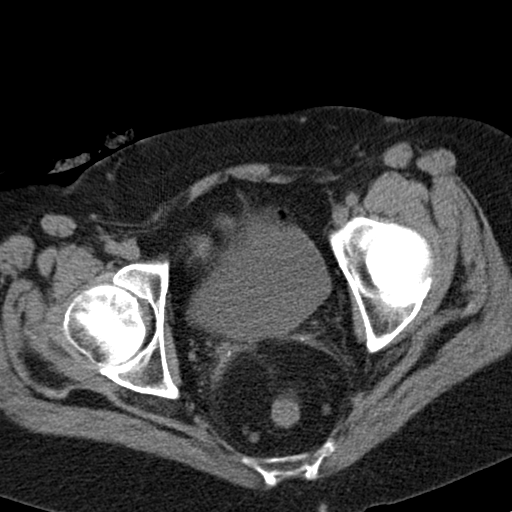
[im 18/65  soft-tissue]
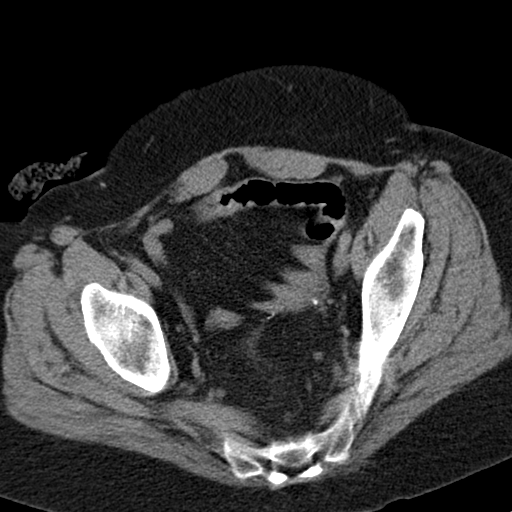
[im 23/65  soft-tissue]
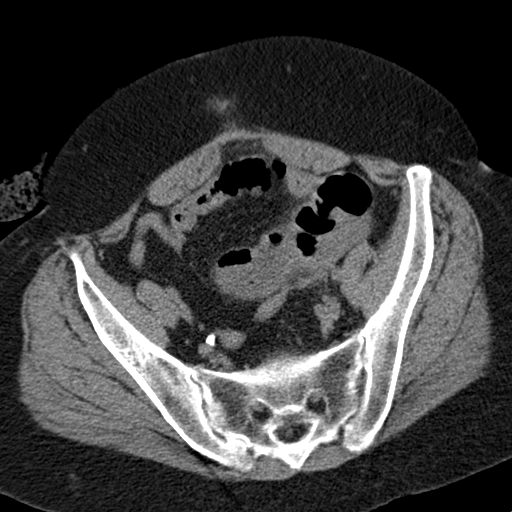
[im 25/65  soft-tissue]
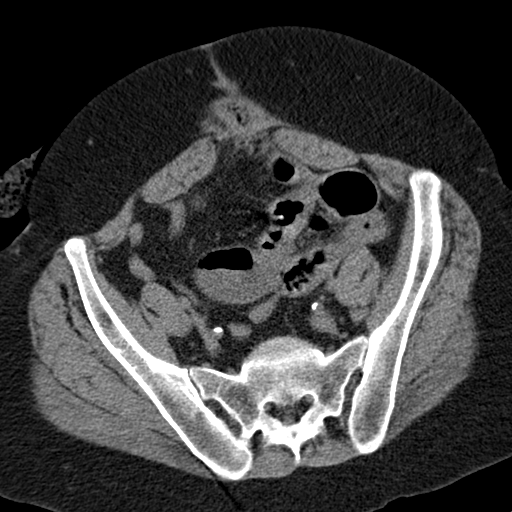
[im 30/65  soft-tissue]
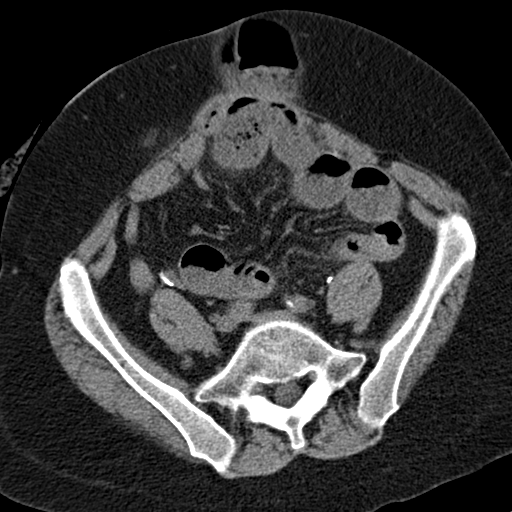
[im 35/65  soft-tissue]
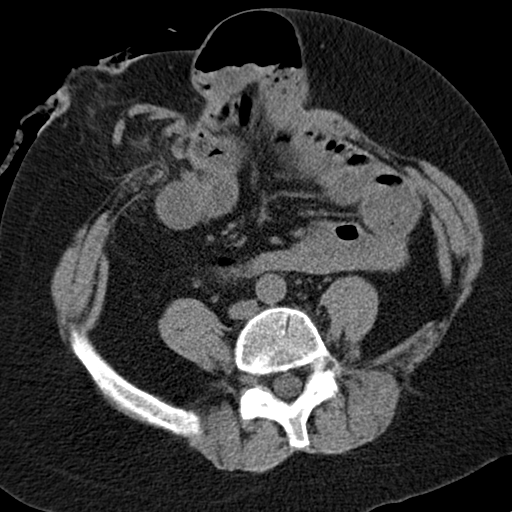
[im 40/65  soft-tissue]
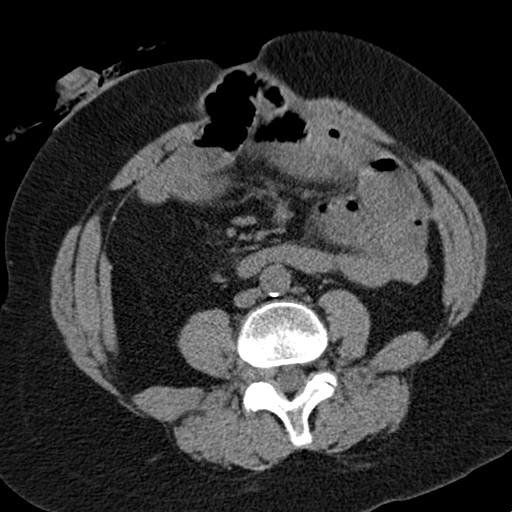
[im 40/65  bone]
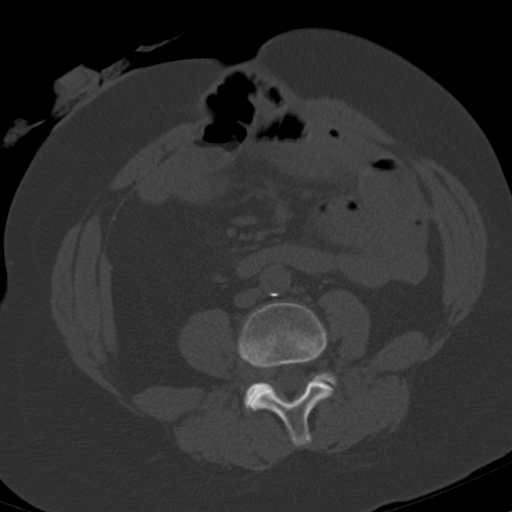
[im 42/65  soft-tissue]
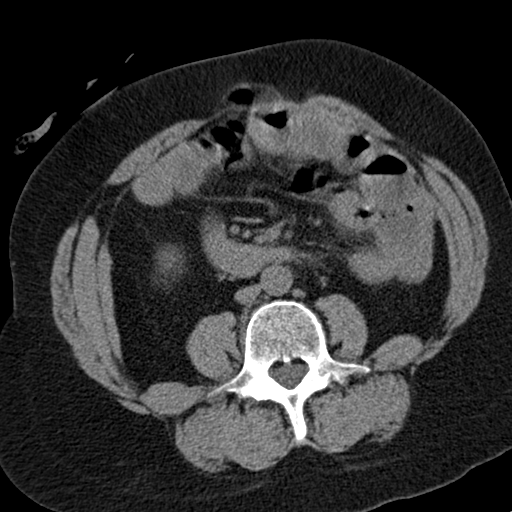
[im 47/65  soft-tissue]
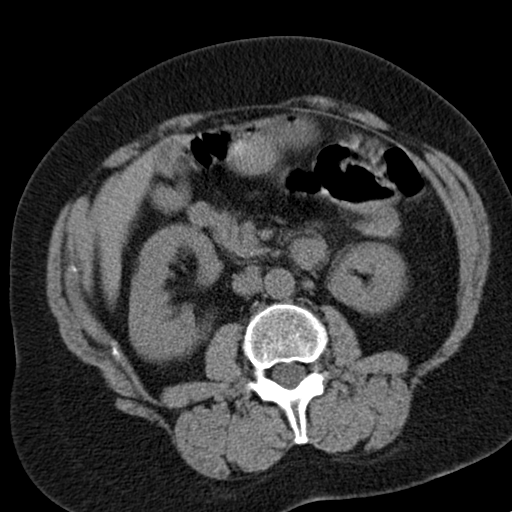
[im 52/65  soft-tissue]
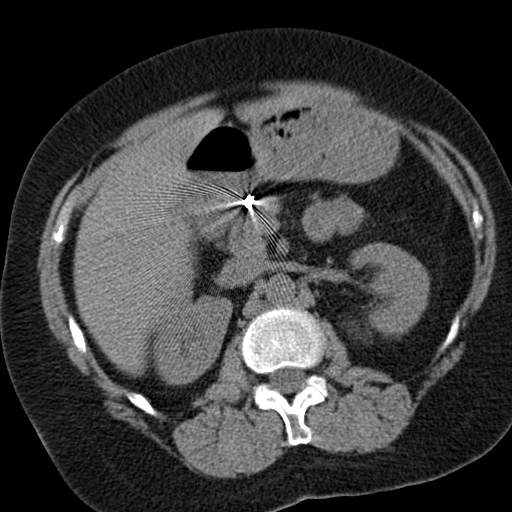
[im 57/65  soft-tissue]
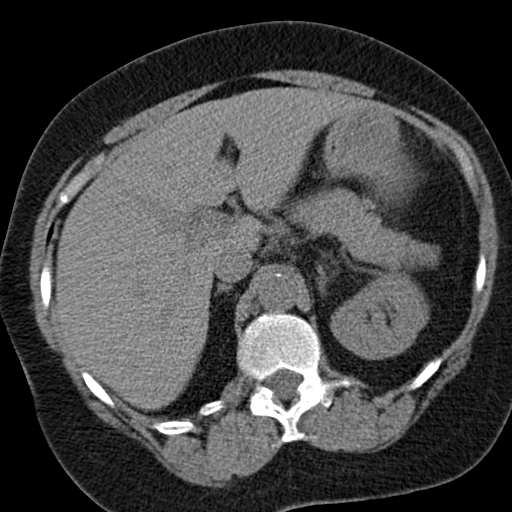
[im 62/65  soft-tissue]
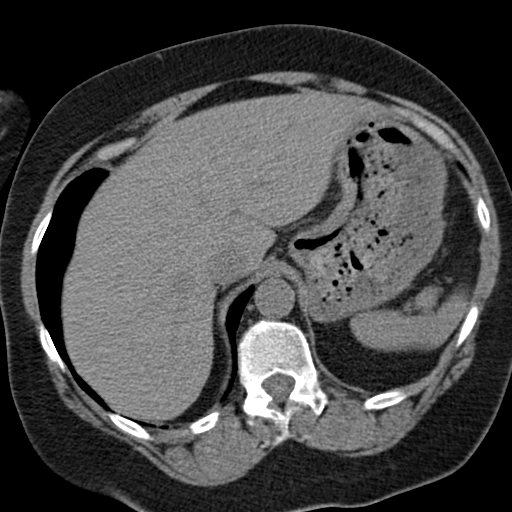

[Series 602: <mpr thick range> · coronal · 0.67mm/px · 3 of 82 slices shown]
[im 28/82  soft-tissue]
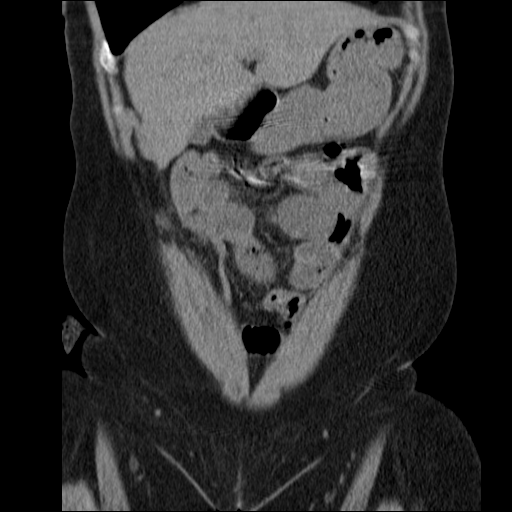
[im 37/82  soft-tissue]
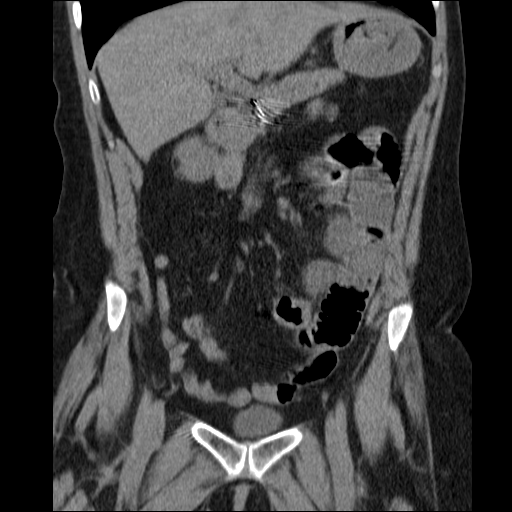
[im 46/82  soft-tissue]
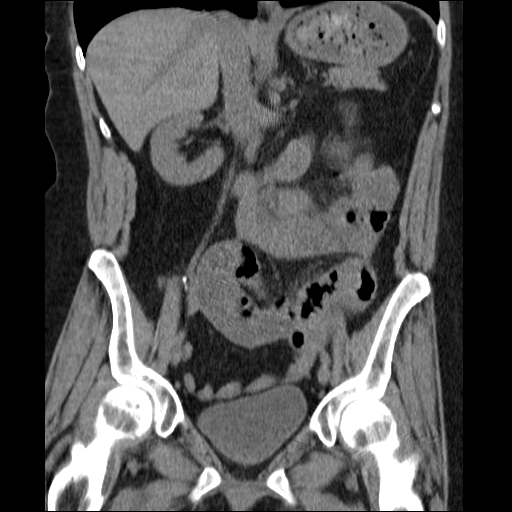

[17 of 46 positions shown; findings below may reference images not displayed]

FINDINGS: There is no evidence of renal calculi or hydronephrosis.
The other abdominal parenchymal organs are unremarkable appearance
on this noncontrast study.  Gallbladder is unremarkable.  Surgical
clips are seen in the region of the porta hepatis.  There is no
evidence of abdominal soft tissue mass or inflammatory process.
IMPRESSION: No evidence of renal calculi, hydronephrosis, or other acute
findings in the upper abdomen.

CT PELVIS
FINDINGS: The patient has undergone sub total colectomy with right
lower quadrant ileostomy.  A parastomal hernia is noted as well as
a midline periumbilical hernia containing small bowel.  Moderately
dilated small bowel loops are seen which contain air fluid levels.
There is a transition point in the right lower quadrant just inside
the ileostomy site, which is suspicious for an adhesion. Distal
small bowel loops are decompressed.

There is no evidence of pelvic mass or inflammatory process. No
abnormal fluid collections are seen.
IMPRESSION: 1.  Mid small bowel obstruction, with transition point just deep to
the right lower quadrant ileostomy site.  This is suspicious for an
adhesion.
2.  Right lower quadrant ileostomy, with parastomal hernia noted.
Midline ventral abdominal wall hernia also noted.

## 2009-07-17 ENCOUNTER — Encounter: Payer: Self-pay | Admitting: Critical Care Medicine

## 2009-07-17 IMAGING — CR DG ABDOMEN ACUTE W/ 1V CHEST
3 series · 3 of 3 positions shown · non-contrast
Comparison: CT from 11/25/2007

CLINICAL DATA: Evaluate for small bowel obstruction versus ileus.

ACUTE ABDOMEN SERIES (ABDOMEN 2 VIEW & CHEST 1 VIEW)

[w chest pa]
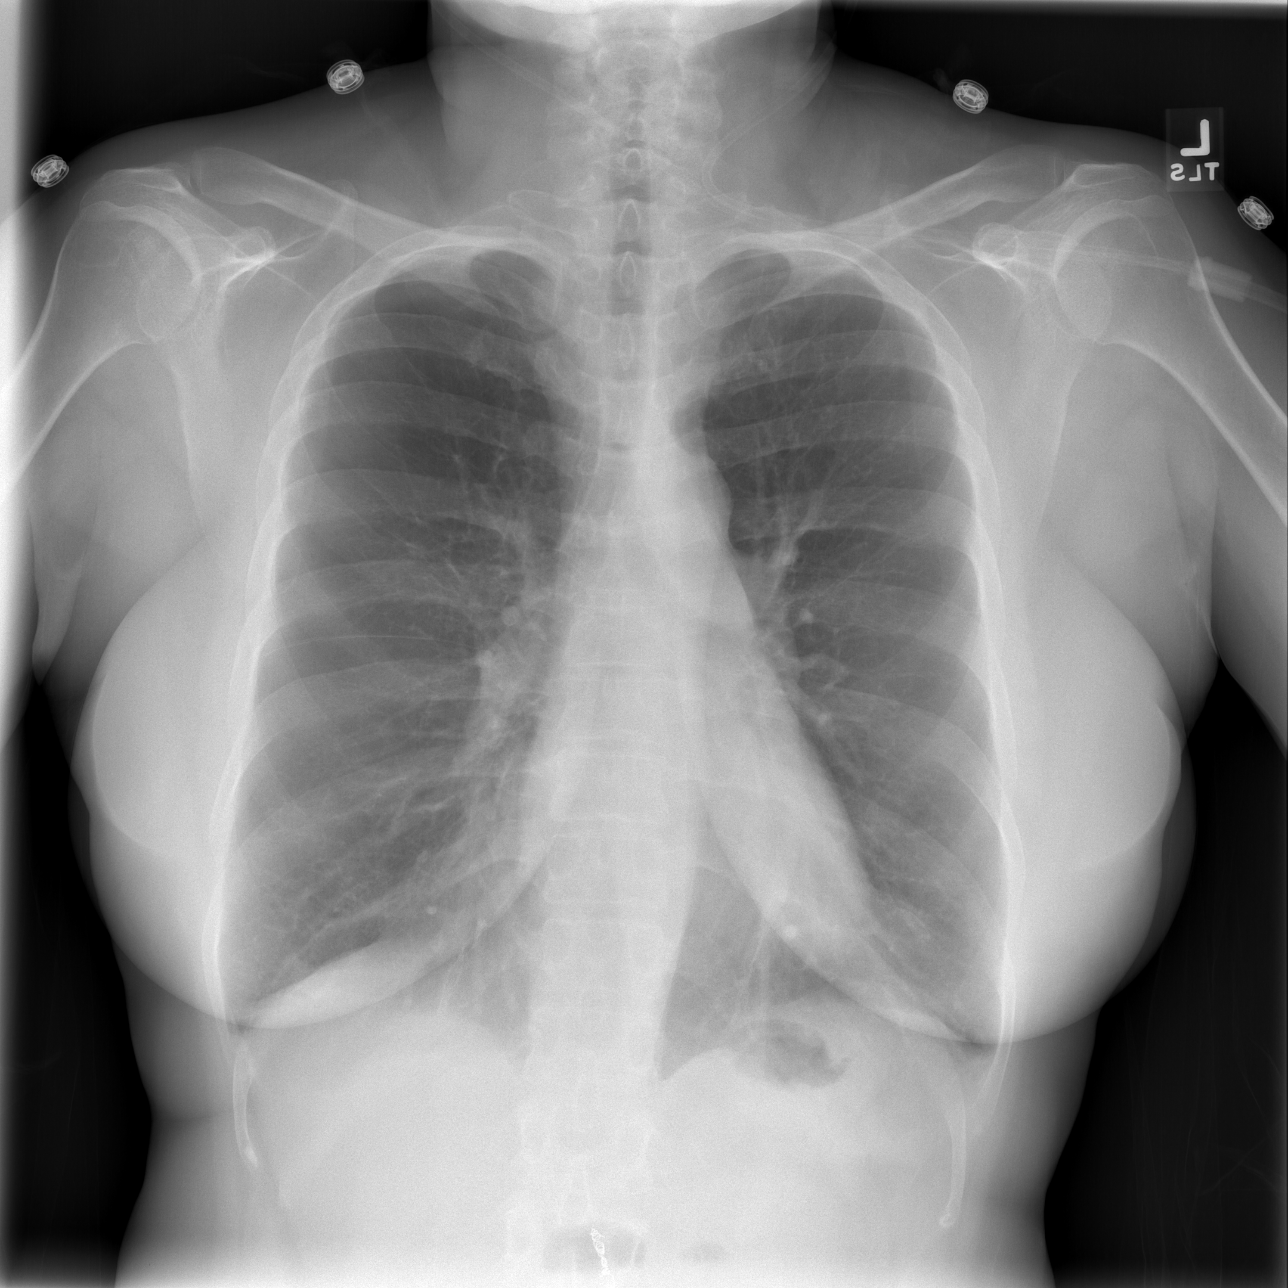

[w abdomen upright *]
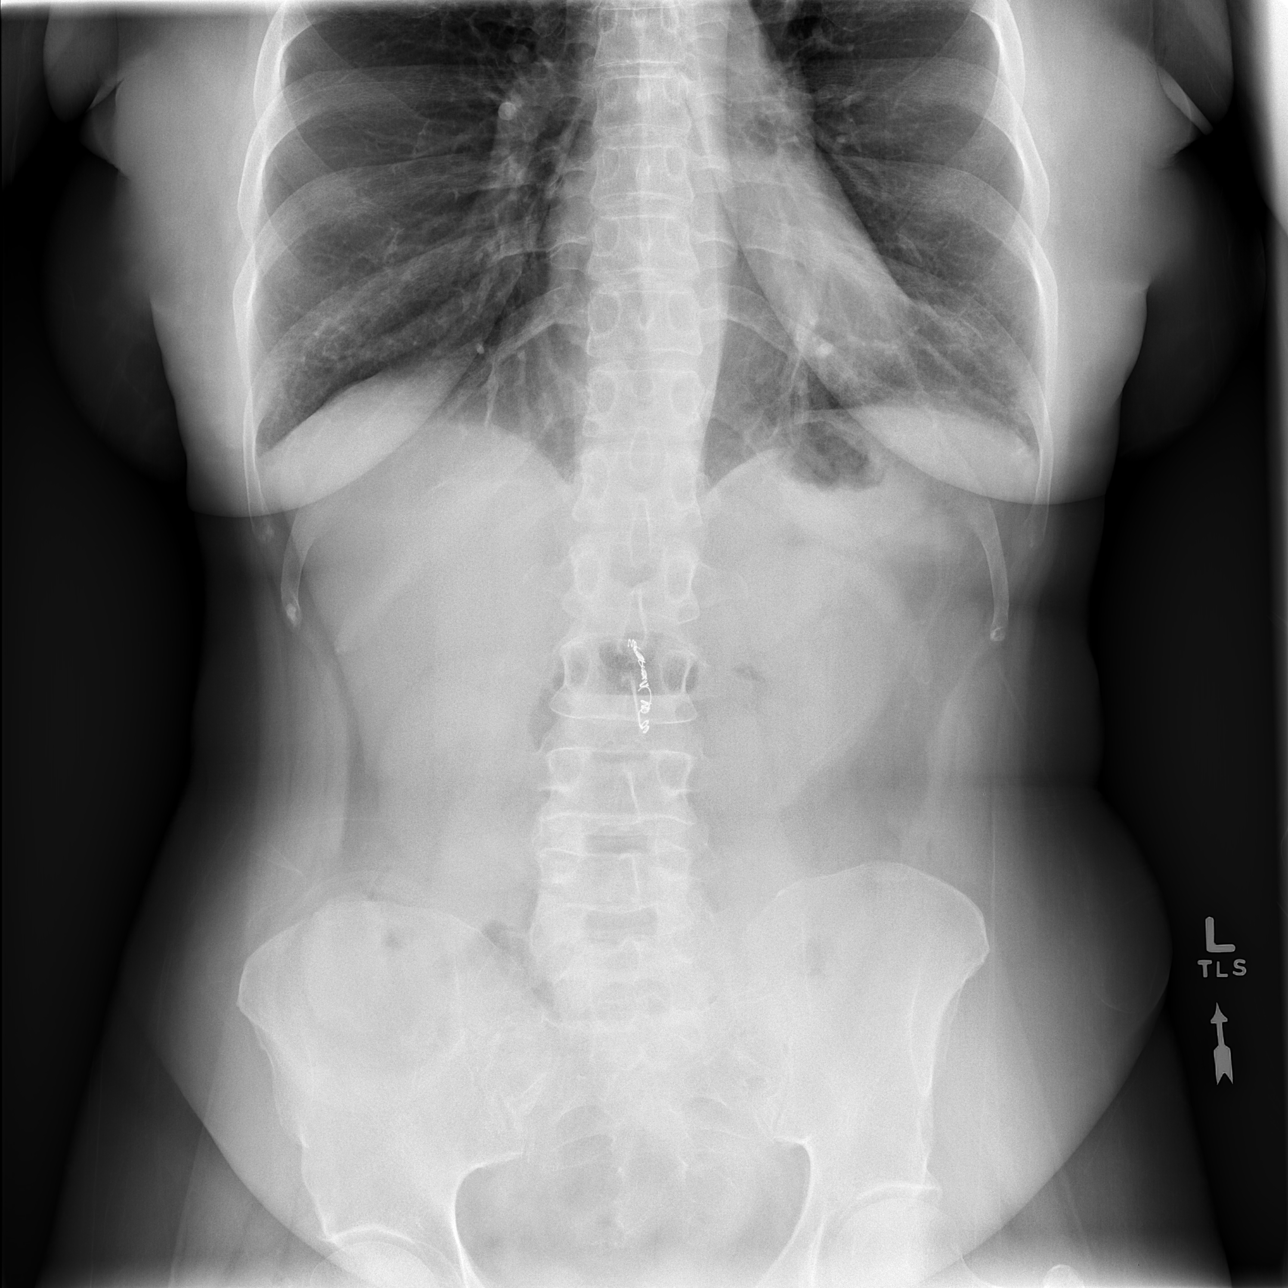

[t abdomen supine]
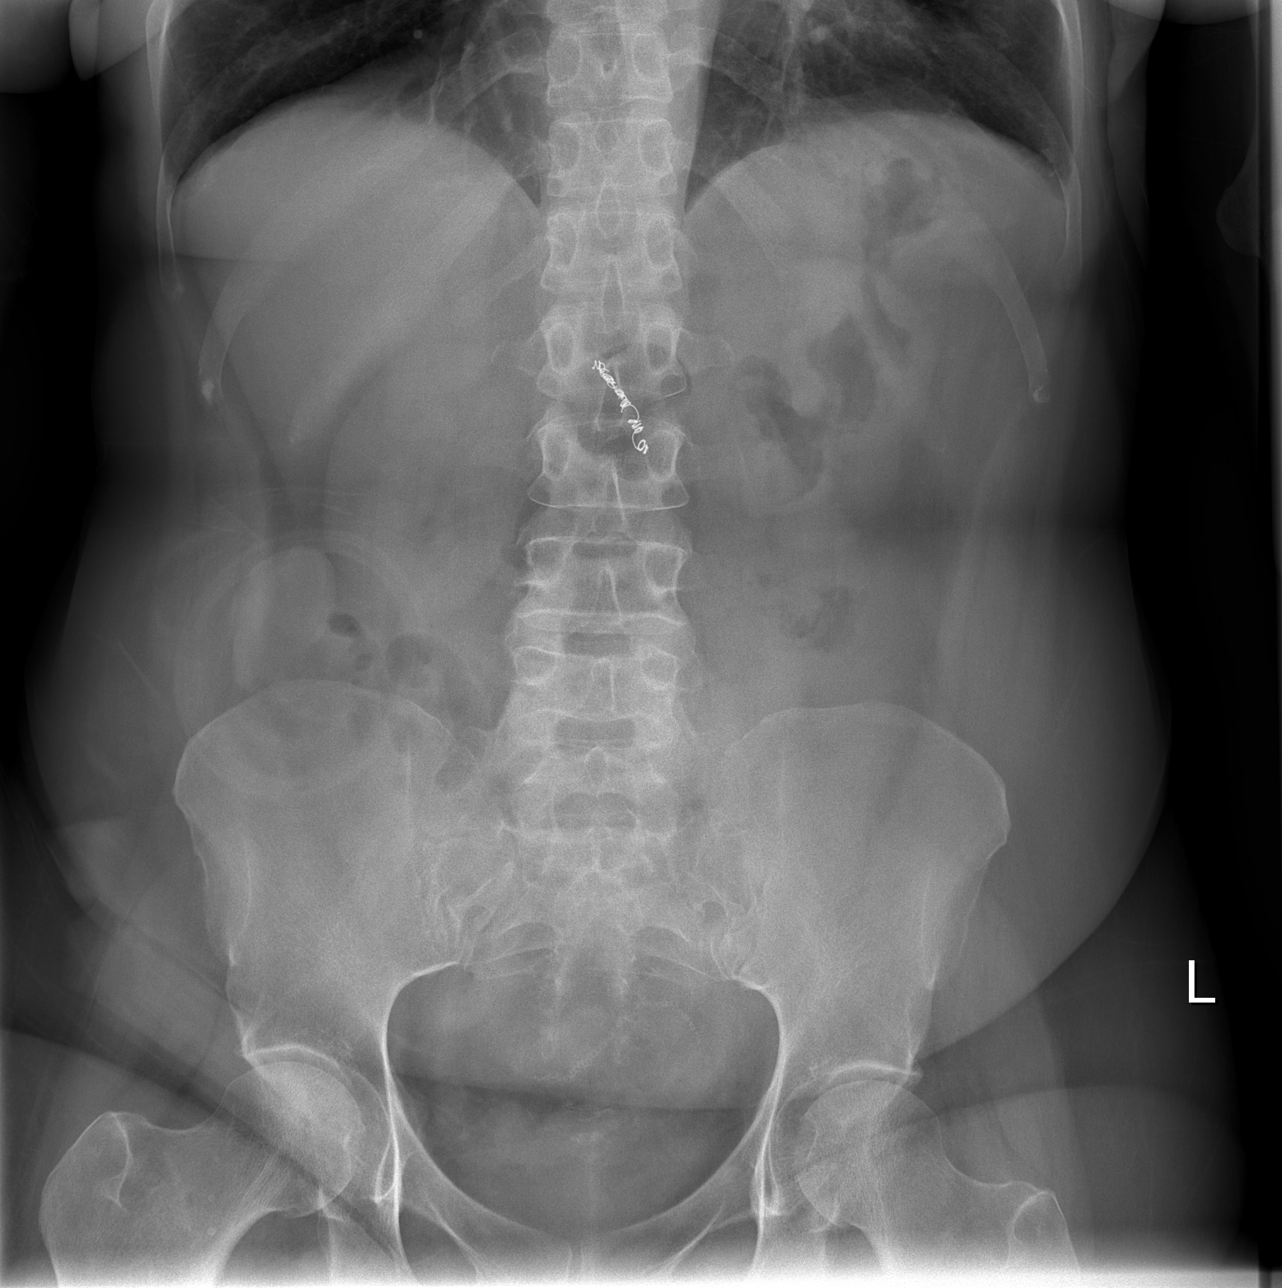

[3 of 3 positions shown; findings below may reference images not displayed]

FINDINGS: The chest radiograph demonstrates clear lungs.  No
evidence for free air.  Patient has a known nodule in the right
lung which is not well seen on this exam.

Again noted are embolization coils in the upper abdomen.  The bowel
gas pattern is nonspecific with a right lower quadrant ostomy.
There are surgical bowel clips in the pelvis.
IMPRESSION: Nonspecific bowel gas pattern.

No acute chest findings.  Known right lung nodule is not well seen
on this examination.

## 2009-09-15 ENCOUNTER — Encounter: Payer: Self-pay | Admitting: Critical Care Medicine

## 2009-09-18 ENCOUNTER — Ambulatory Visit: Payer: Self-pay | Admitting: Critical Care Medicine

## 2009-10-09 ENCOUNTER — Ambulatory Visit: Payer: Self-pay | Admitting: Internal Medicine

## 2009-10-10 ENCOUNTER — Inpatient Hospital Stay (HOSPITAL_COMMUNITY): Admission: EM | Admit: 2009-10-10 | Discharge: 2009-10-14 | Payer: Self-pay | Admitting: Emergency Medicine

## 2009-10-12 ENCOUNTER — Encounter: Payer: Self-pay | Admitting: Internal Medicine

## 2009-10-30 ENCOUNTER — Telehealth (INDEPENDENT_AMBULATORY_CARE_PROVIDER_SITE_OTHER): Payer: Self-pay | Admitting: *Deleted

## 2009-11-08 ENCOUNTER — Telehealth (INDEPENDENT_AMBULATORY_CARE_PROVIDER_SITE_OTHER): Payer: Self-pay | Admitting: *Deleted

## 2009-11-14 ENCOUNTER — Telehealth (INDEPENDENT_AMBULATORY_CARE_PROVIDER_SITE_OTHER): Payer: Self-pay | Admitting: *Deleted

## 2009-11-15 ENCOUNTER — Telehealth (INDEPENDENT_AMBULATORY_CARE_PROVIDER_SITE_OTHER): Payer: Self-pay | Admitting: *Deleted

## 2009-11-20 ENCOUNTER — Ambulatory Visit: Payer: Self-pay | Admitting: Critical Care Medicine

## 2009-11-29 ENCOUNTER — Ambulatory Visit: Payer: Self-pay | Admitting: Obstetrics and Gynecology

## 2009-12-04 ENCOUNTER — Telehealth (INDEPENDENT_AMBULATORY_CARE_PROVIDER_SITE_OTHER): Payer: Self-pay | Admitting: *Deleted

## 2009-12-04 ENCOUNTER — Encounter: Payer: Self-pay | Admitting: Critical Care Medicine

## 2009-12-11 ENCOUNTER — Ambulatory Visit: Payer: Self-pay | Admitting: Obstetrics and Gynecology

## 2009-12-24 IMAGING — CR DG CHEST 2V
2 series · 2 of 2 positions shown · non-contrast
Comparison: 09/13/2007

CLINICAL DATA: Acute bronchitis.

CHEST - 2 VIEW

[view not recorded (1 of 2)]
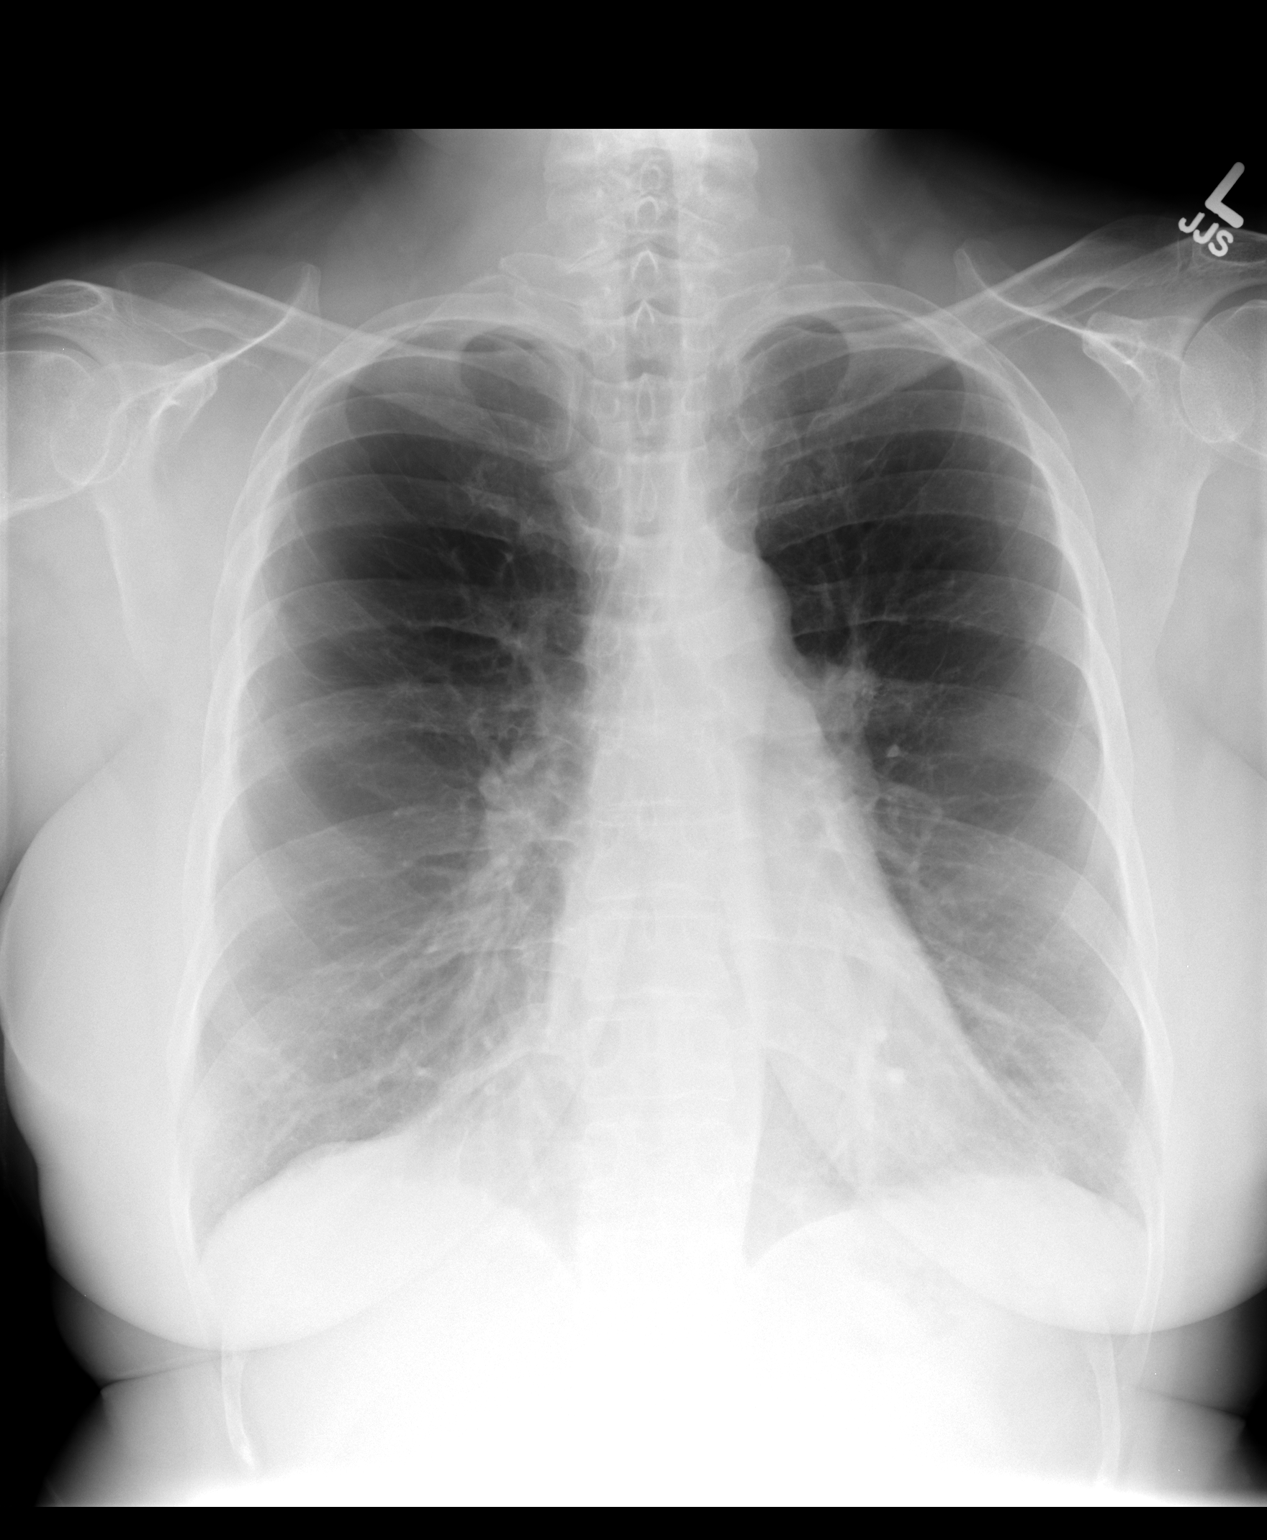

[view not recorded (2 of 2)]
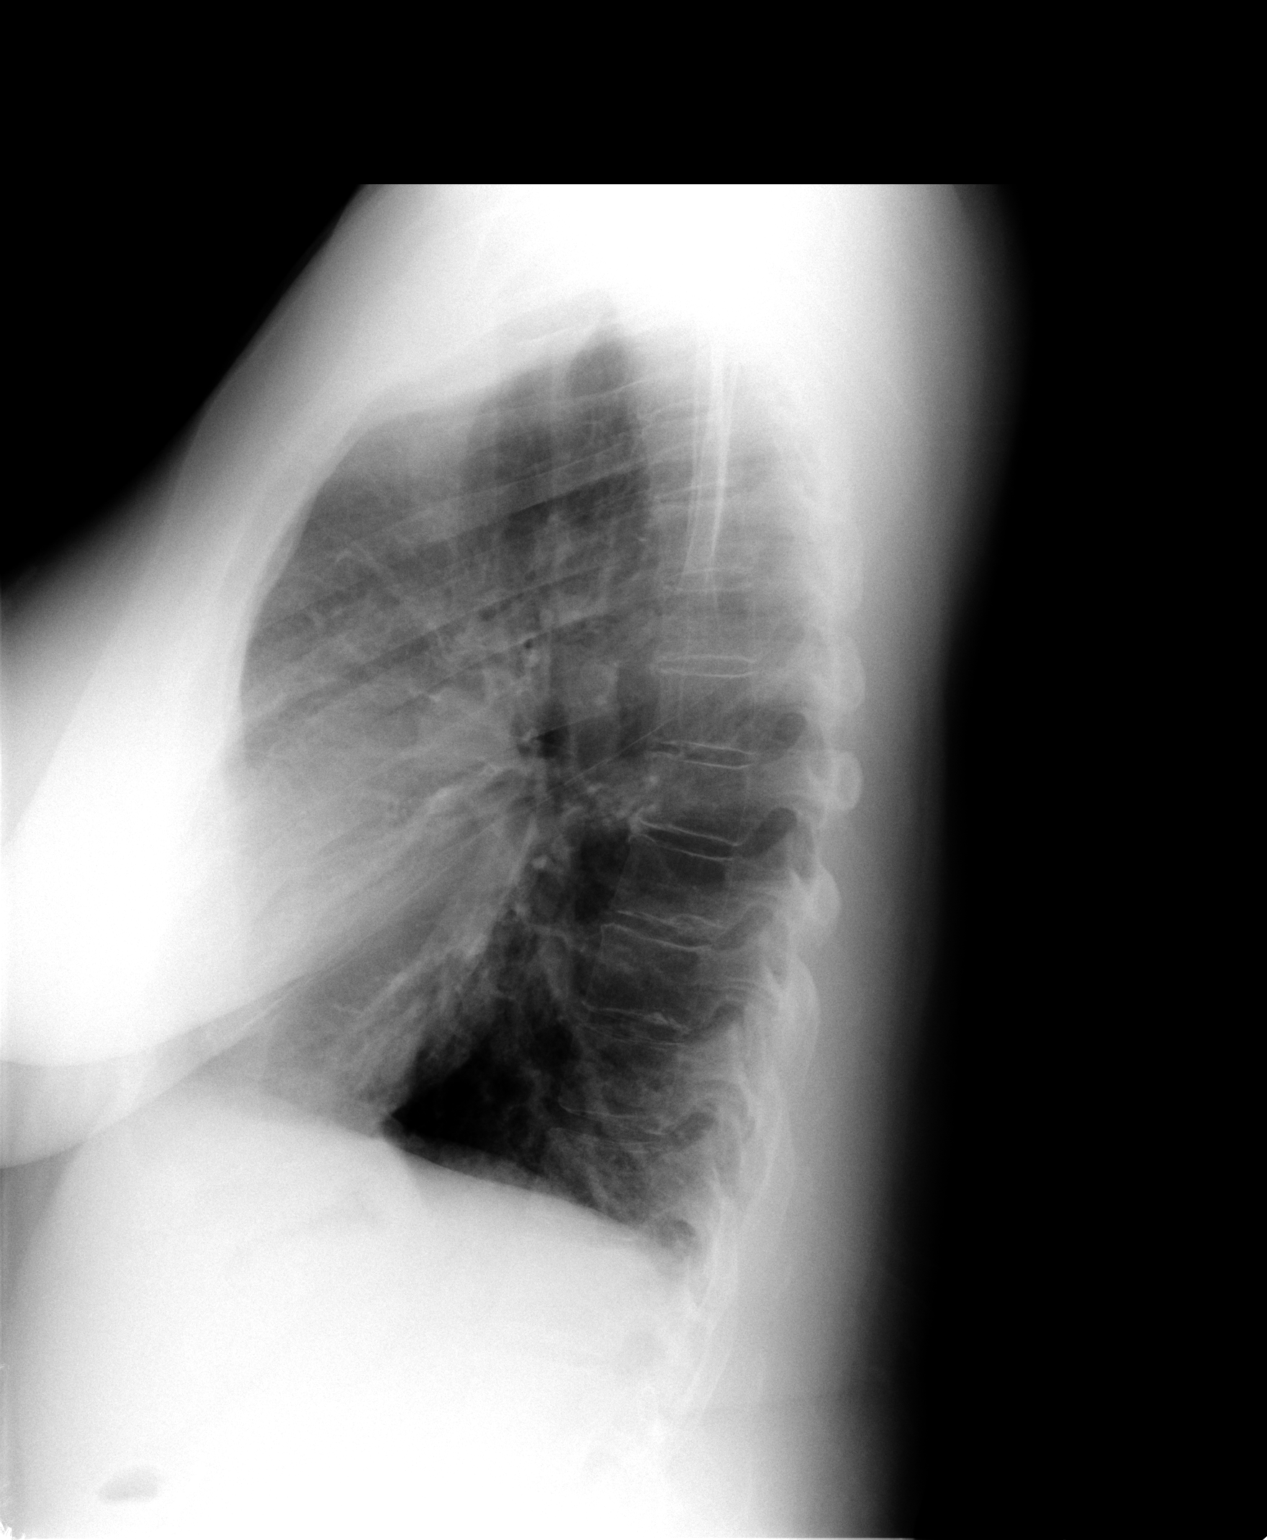

[2 of 2 positions shown; findings below may reference images not displayed]

FINDINGS: There is hyperinflation of the lungs.  Stable nodular
density in the right upper lobe.  No acute infiltrates or
effusions.  Heart is normal size.  No acute bony abnormality.
IMPRESSION: Stable right upper lobe nodule.

COPD.

## 2009-12-25 ENCOUNTER — Ambulatory Visit: Payer: Self-pay | Admitting: Obstetrics and Gynecology

## 2009-12-28 ENCOUNTER — Ambulatory Visit (HOSPITAL_COMMUNITY): Admission: RE | Admit: 2009-12-28 | Discharge: 2009-12-28 | Payer: Self-pay | Admitting: Obstetrics and Gynecology

## 2010-01-14 ENCOUNTER — Telehealth (INDEPENDENT_AMBULATORY_CARE_PROVIDER_SITE_OTHER): Payer: Self-pay | Admitting: *Deleted

## 2010-01-14 ENCOUNTER — Ambulatory Visit: Payer: Self-pay | Admitting: Critical Care Medicine

## 2010-02-04 IMAGING — CR DG CHEST 1V PORT
1 series · 1 of 1 positions shown · non-contrast
Comparison: Portable chest x-ray of 06/16/2008

CLINICAL DATA: Short of breath, endotracheal tube adjustment

PORTABLE CHEST - 1 VIEW

[view not recorded]
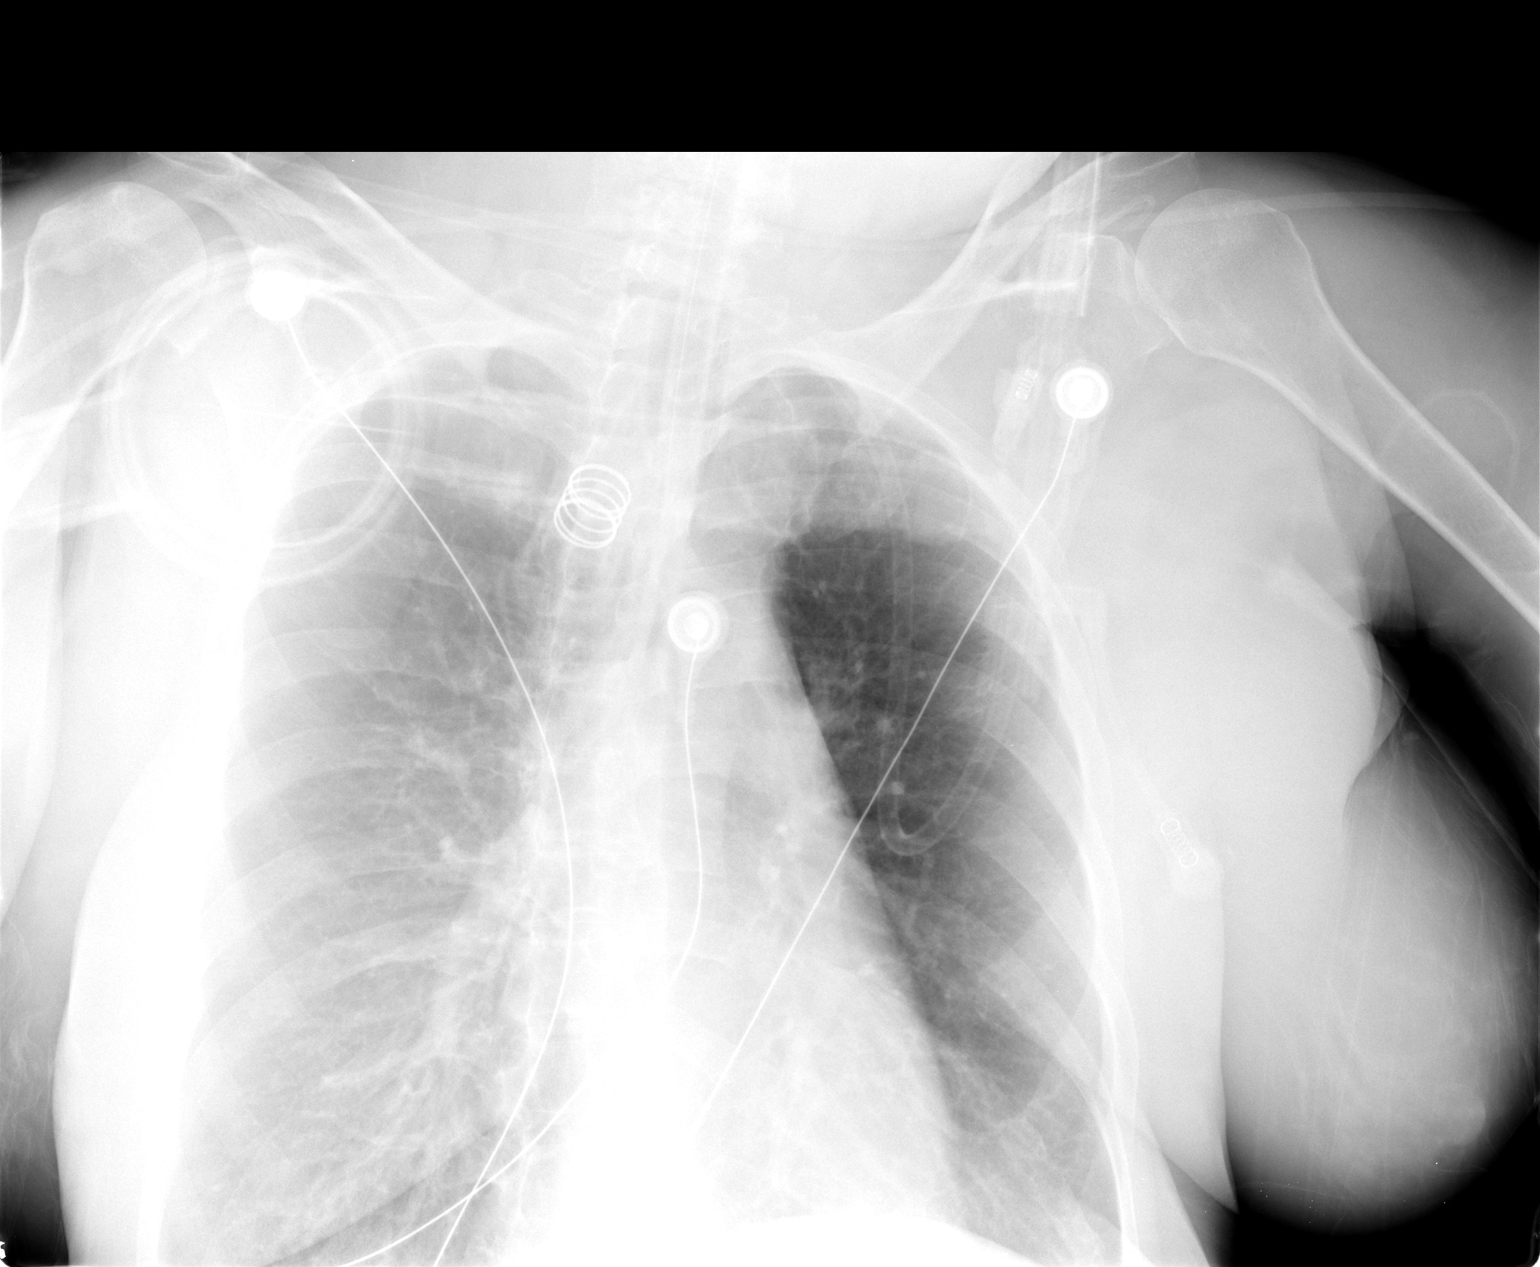

[1 of 1 positions shown; findings below may reference images not displayed]

FINDINGS: The tip of the endotracheal tube is now approximately
cm above the carina.  There has been no significant change in
aeration with the lungs somewhat hyperaerated.  Heart size is
stable.
IMPRESSION: Endotracheal tube now 4.5 cm above carina.  The lungs appear clear
and hyperaerated.

## 2010-02-05 IMAGING — CR DG CHEST 1V PORT
1 series · 1 of 1 positions shown · non-contrast
Comparison: Yesterday.

CLINICAL DATA: Respiratory failure.  Pneumonia.

PORTABLE CHEST - 1 VIEW

[view not recorded]
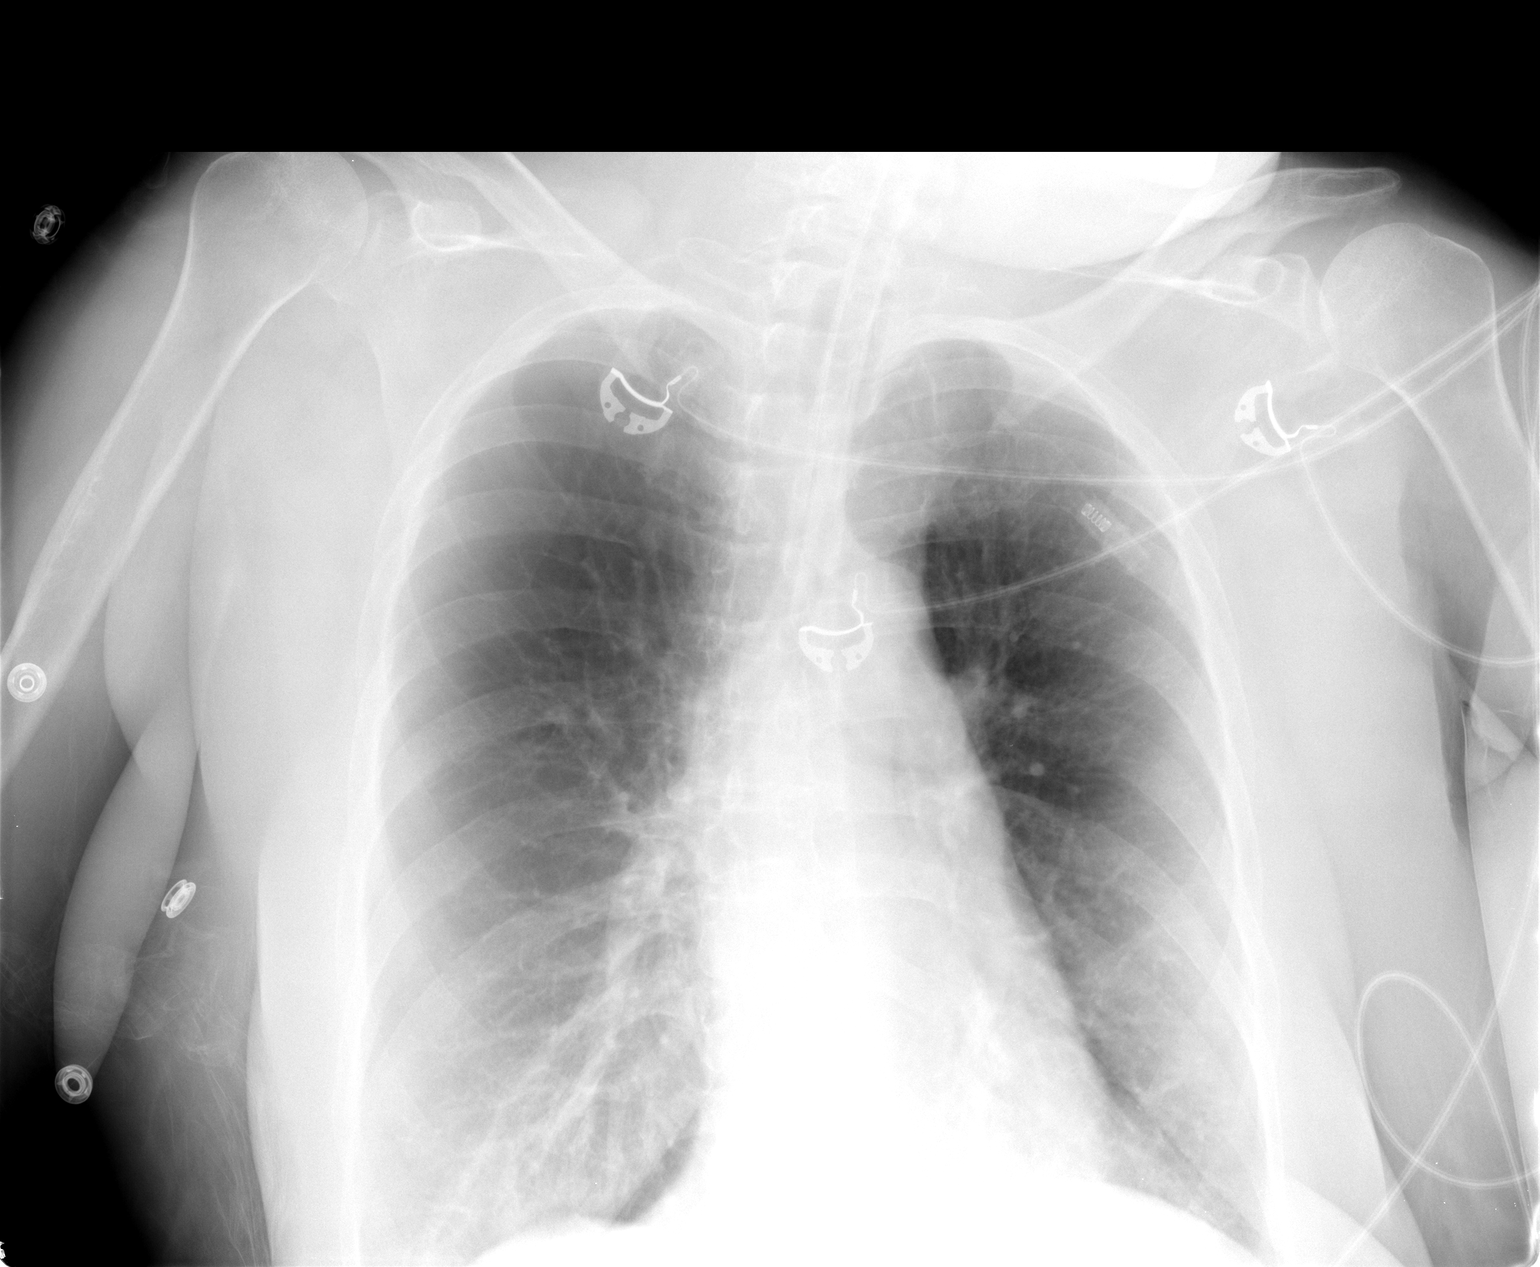

[1 of 1 positions shown; findings below may reference images not displayed]

FINDINGS: The endotracheal tube is in satisfactory position.
Normal sized heart.  Hyperexpanded lungs.  Mild scoliosis, at least
partially positional.
IMPRESSION: Stable changes of COPD.  No acute abnormality.

## 2010-02-05 IMAGING — CR DG ABD PORTABLE 1V
1 series · 1 of 1 positions shown · non-contrast
Comparison: 11/27/2007

CLINICAL DATA: Evaluate feeding tube placement

ABDOMEN - 1 VIEW

[view not recorded]
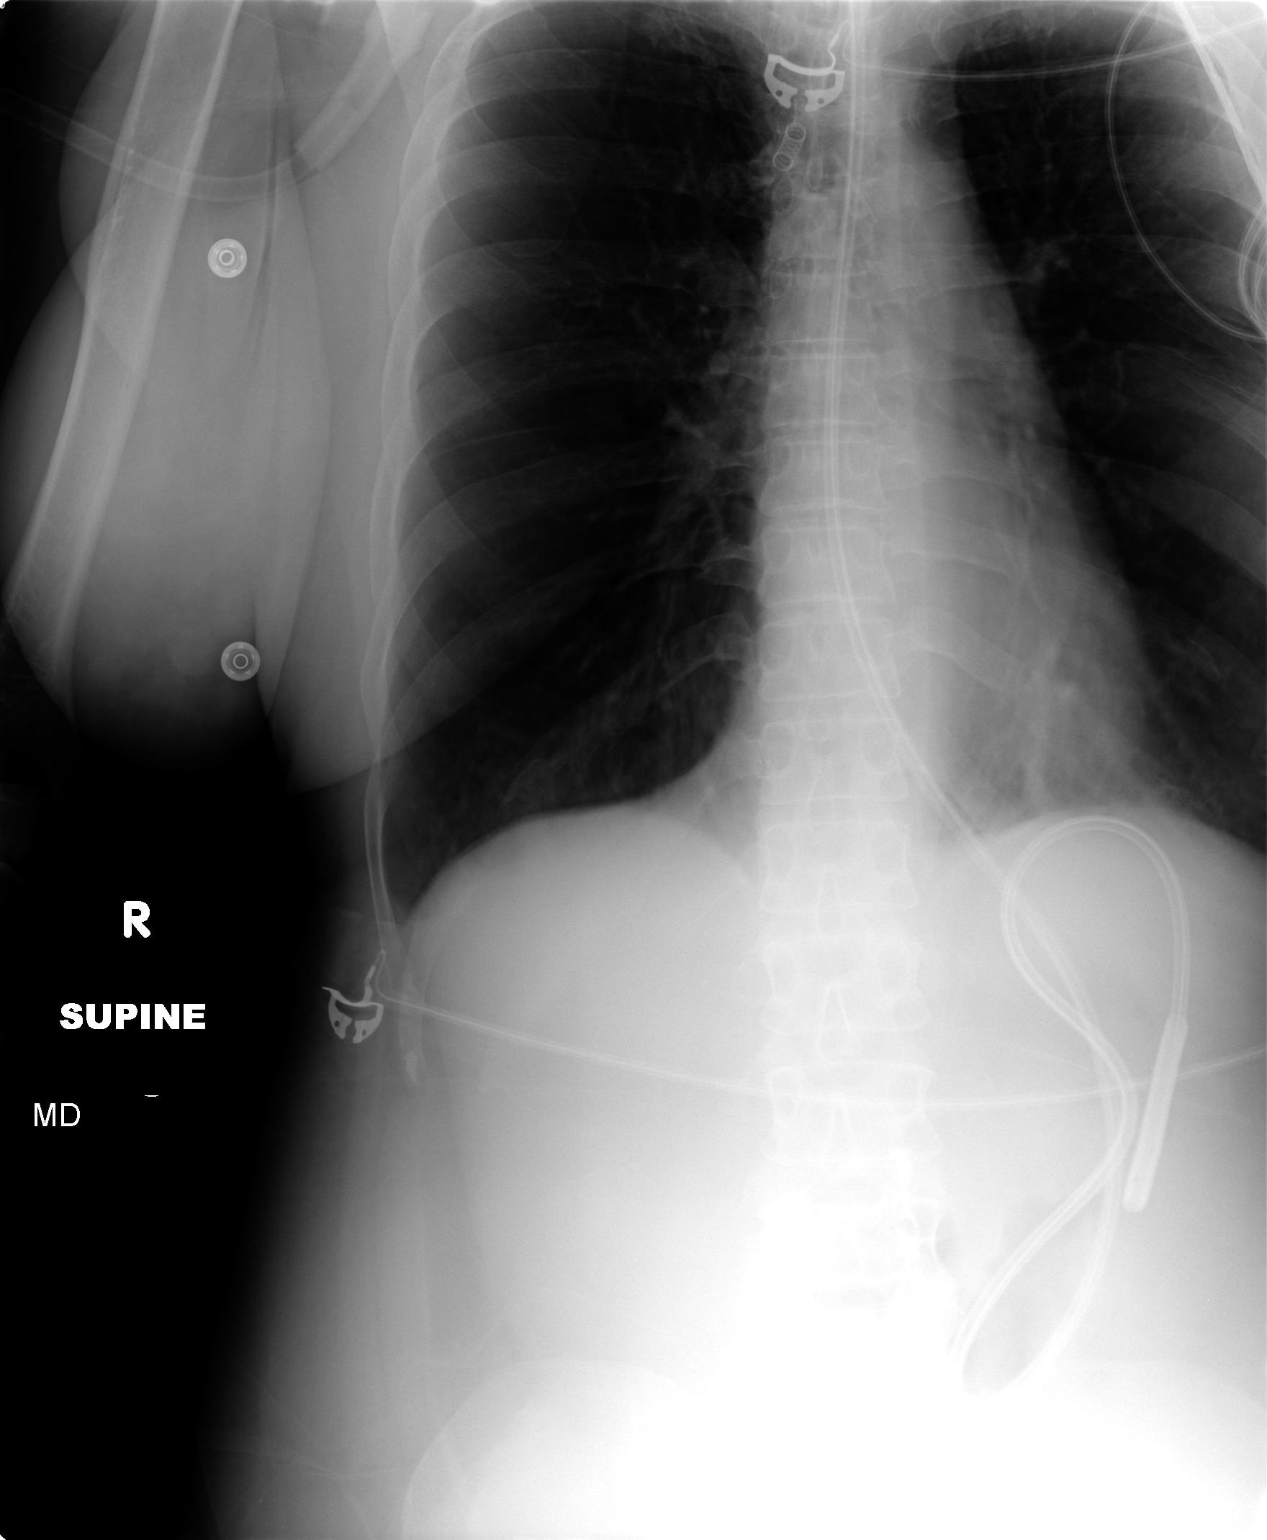

[1 of 1 positions shown; findings below may reference images not displayed]

FINDINGS: The feeding tube is coiled in the stomach.  Visualized
lungs are clear.  There are surgical clips in the abdomen.
IMPRESSION: Feeding tube is coiled the stomach.

## 2010-02-06 IMAGING — CR DG CHEST 1V PORT
1 series · 1 of 1 positions shown · non-contrast
Comparison: 06/17/2008 and earlier.

CLINICAL DATA: 57-year-old female with shortness of breath on the
ventilator.

PORTABLE CHEST - 1 VIEW

[view not recorded]
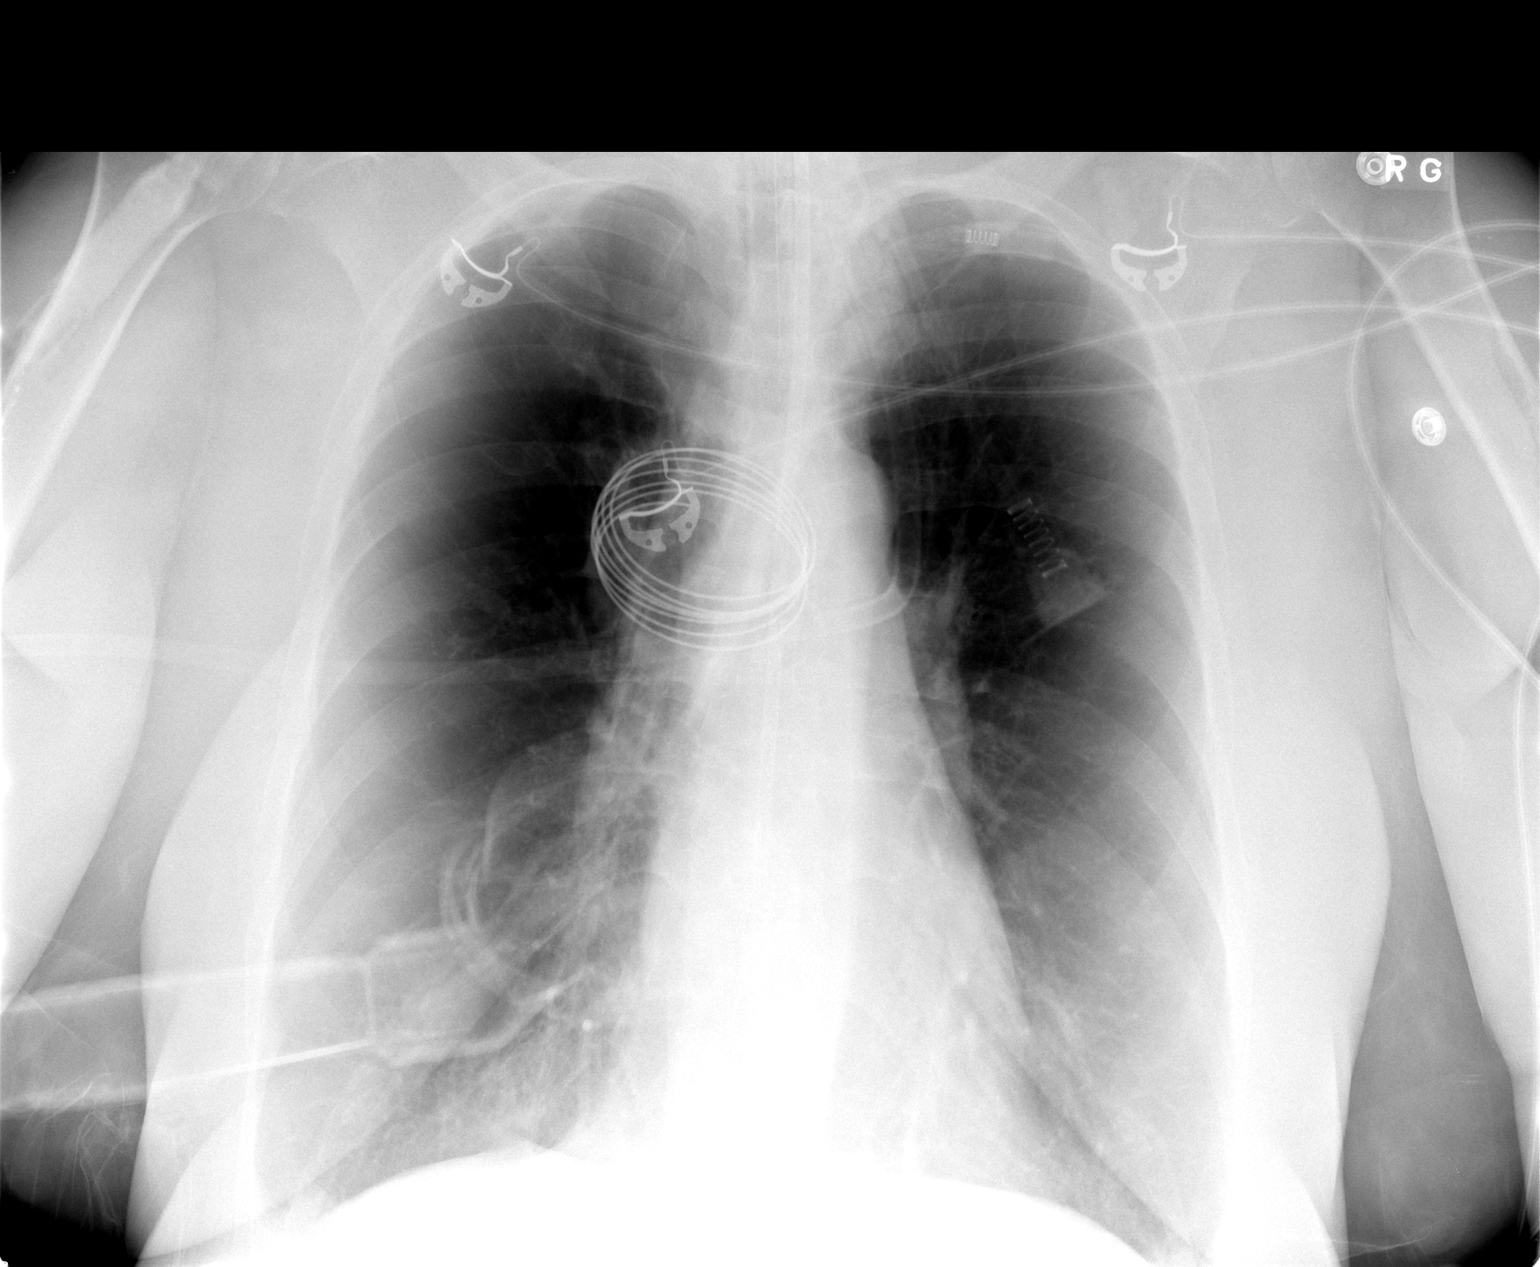

[1 of 1 positions shown; findings below may reference images not displayed]

FINDINGS: Portable AP view [id number] hours.  Endotracheal tube tip at
the level of clavicles.  Feeding tube courses to the abdomen, tip
not included.  Stable cardiac size and mediastinal contours.  Large
lung volumes.  Emphysema suspected.  No pneumothorax, pulmonary
edema, definite effusion or confluent airspace opacity.
IMPRESSION: 1.  Stable ET tube.  Feeding tube placed coursing to the abdomen,
tip not included.
2.  Stable lungs.

## 2010-02-07 IMAGING — CR DG CHEST 1V PORT
1 series · 1 of 1 positions shown · non-contrast
Comparison: Portable chest 06/18/2008.

CLINICAL DATA: Shortness of breath.  Wheezing.

PORTABLE CHEST - 1 VIEW

[view not recorded]
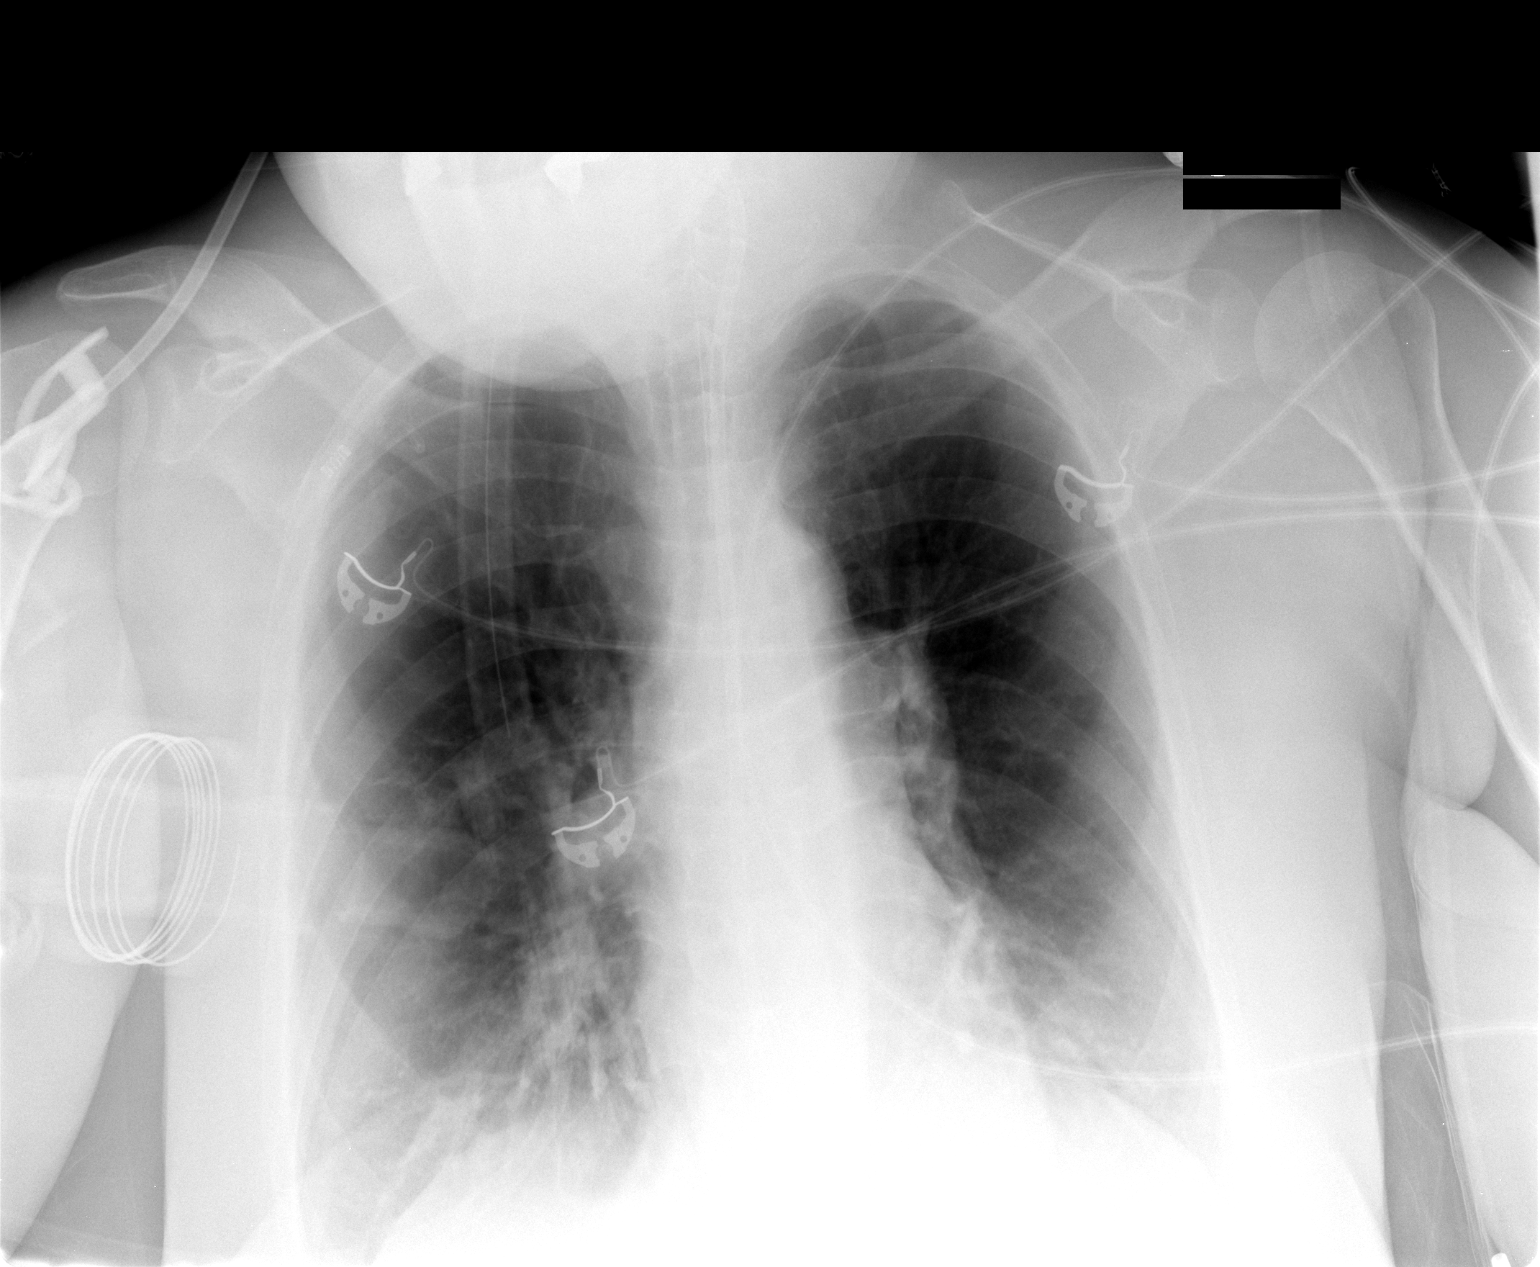

[1 of 1 positions shown; findings below may reference images not displayed]

FINDINGS: Support apparatus is unchanged.  Chest is hyperexpanded
but the lungs appear clear.  No pleural effusion.  Heart size
normal.
IMPRESSION: Emphysema without acute disease.

## 2010-02-08 IMAGING — CR DG CHEST 1V PORT
1 series · 1 of 1 positions shown · non-contrast
Comparison: Support tubes and lines are unchanged.

CLINICAL DATA: Shortness of breath.

PORTABLE CHEST - 1 VIEW

[view not recorded]
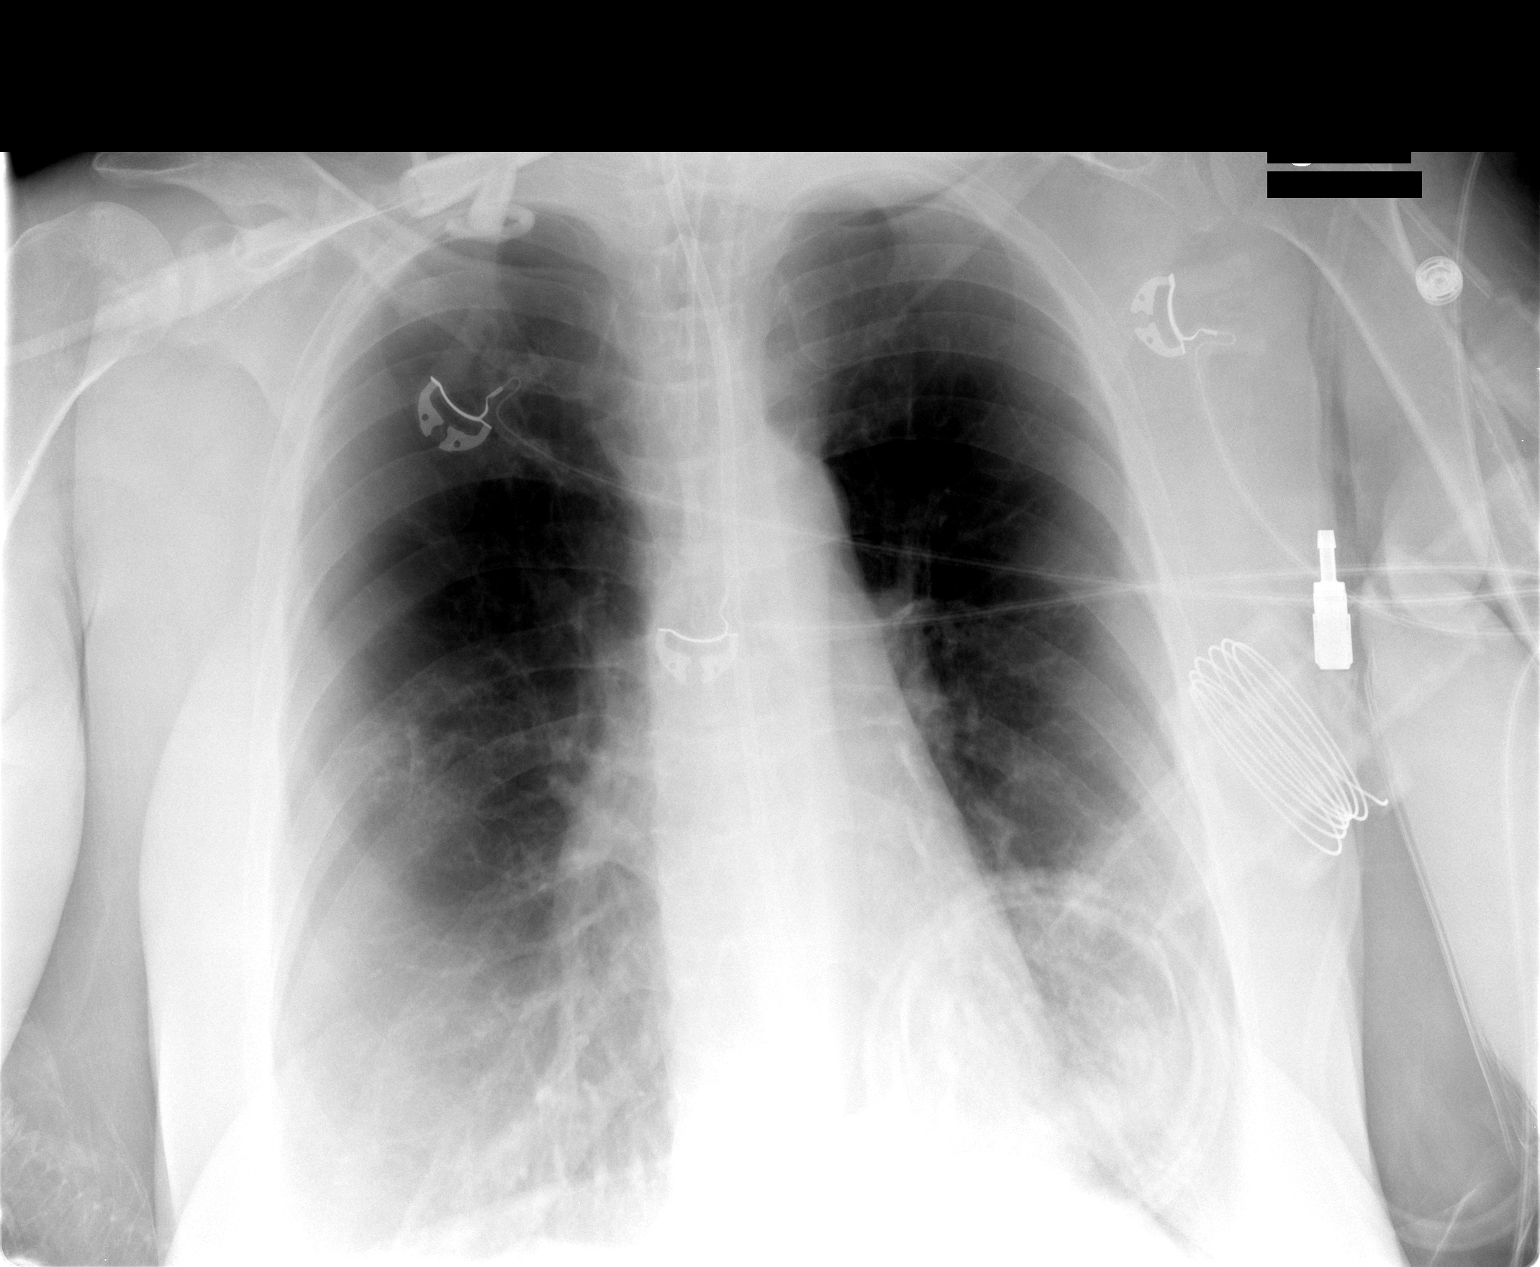

[1 of 1 positions shown; findings below may reference images not displayed]

FINDINGS: Lungs are markedly emphysematous.  There is some left
basilar subsegmental atelectasis.  Lungs otherwise appear clear.
IMPRESSION: 1.  Left basilar subsegmental atelectasis.
2.  Severe emphysema.

## 2010-02-08 IMAGING — CR DG CHEST 1V PORT
1 series · 1 of 1 positions shown · non-contrast
Comparison: 7767 hours the same day.

CLINICAL DATA: 57-year-old female PICC line placement.

PORTABLE CHEST - 1 VIEW

[view not recorded]
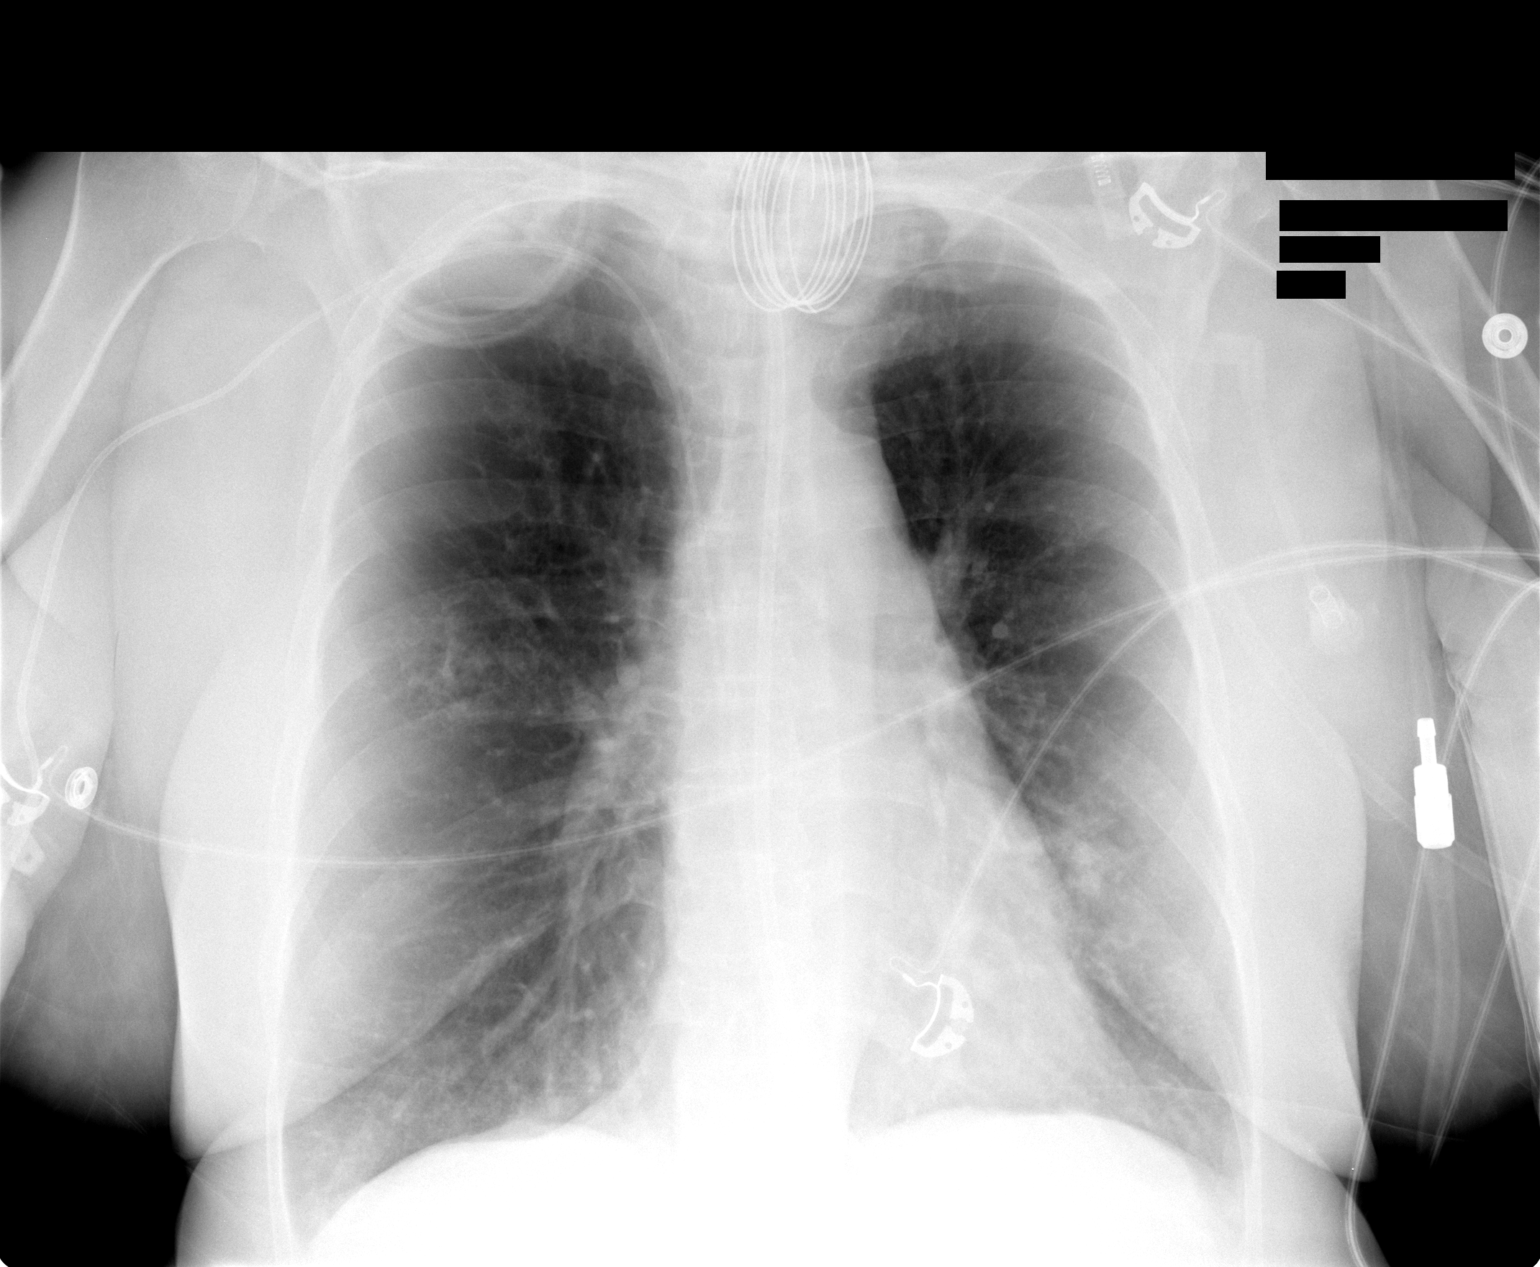

[1 of 1 positions shown; findings below may reference images not displayed]

FINDINGS: AP portable semi upright view at 9676 hours. Right upper
extremity approach PICC line catheter, tip at the level of the
superior vena cava.  Feeding tube re-identified extending to the
abdomen and appears to have the loop in the gastric fundus.  The
tip is not included.  Endotracheal tube tip is stable the level of
clavicles.  Stable lung volumes.  No pneumothorax.  Stable
ventilation.  Stable cardiac size and mediastinal contours.
IMPRESSION: 1. Right upper extremity approach PICC line catheter, tip at the
level of the SVC.
2.  Otherwise stable lines and tubes and no acute pulmonary
findings.

## 2010-02-10 IMAGING — CT CT HEAD W/O CM
1 series · 15 of 30 positions shown, 19 images · non-contrast
Comparison: None available.

CLINICAL DATA: Altered mental status.  Not following commands.

CT HEAD WITHOUT CONTRAST
TECHNIQUE: Contiguous axial images were obtained from the base of
the skull through the vertex without contrast.

[Series 2: headseq 4.8 h45s · axial · 0.43mm/px · z∈[-140,-12]mm · 15 of 30 slices shown, 19 images]
[im 2/30  brain]
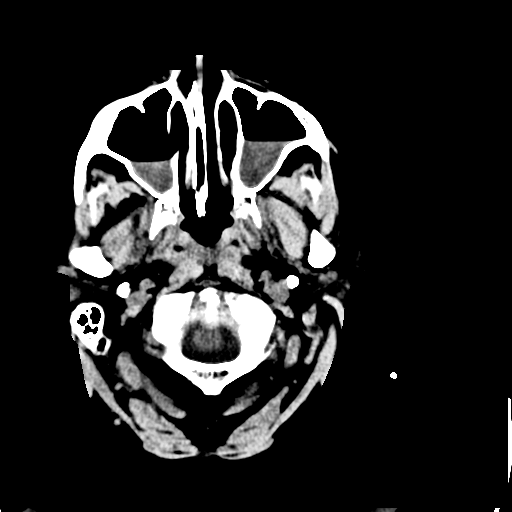
[im 2/30  bone]
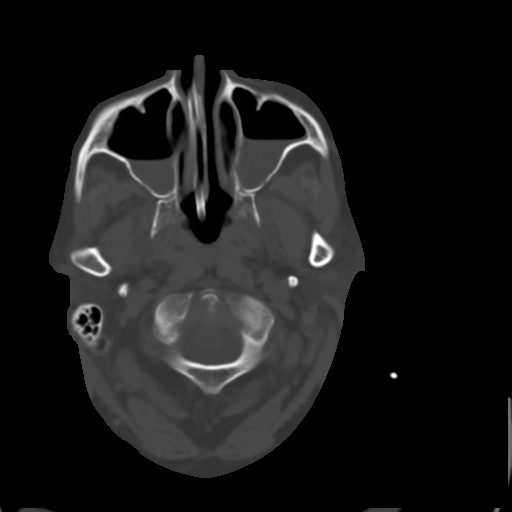
[im 4/30  brain]
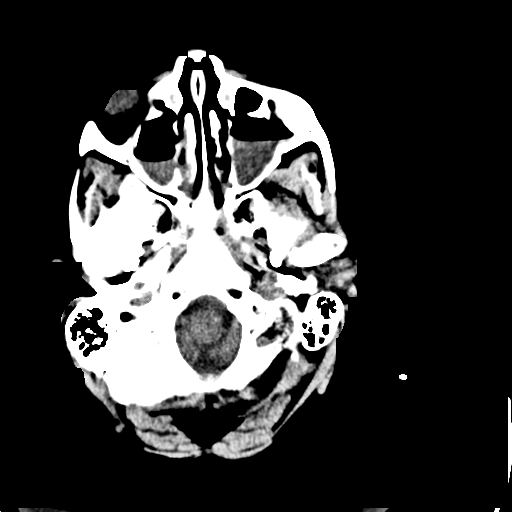
[im 6/30  brain]
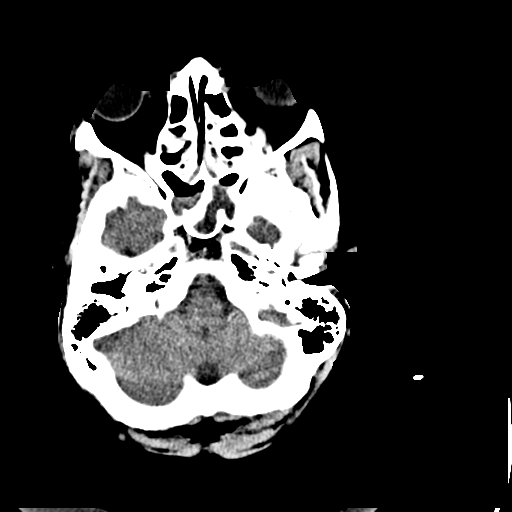
[im 8/30  brain]
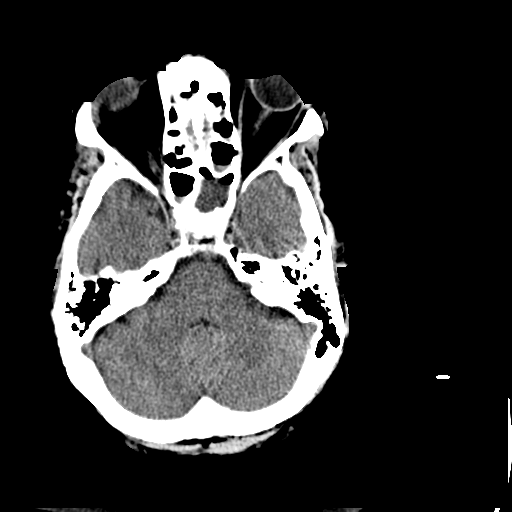
[im 10/30  brain]
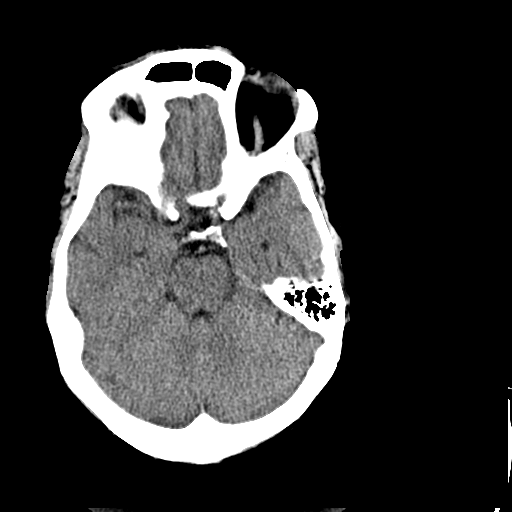
[im 10/30  bone]
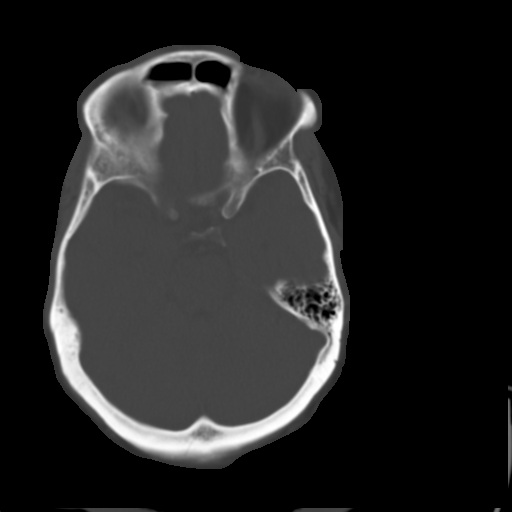
[im 12/30  brain]
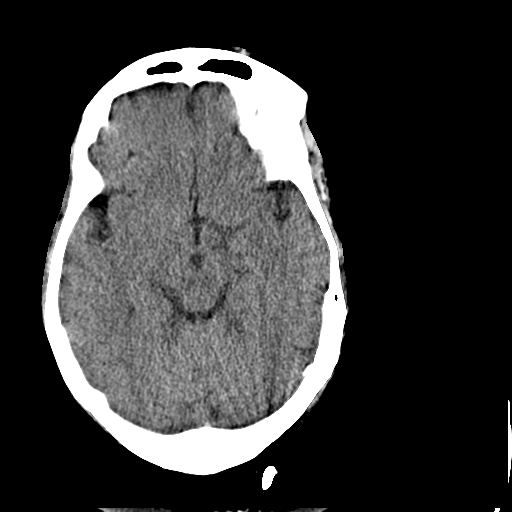
[im 14/30  brain]
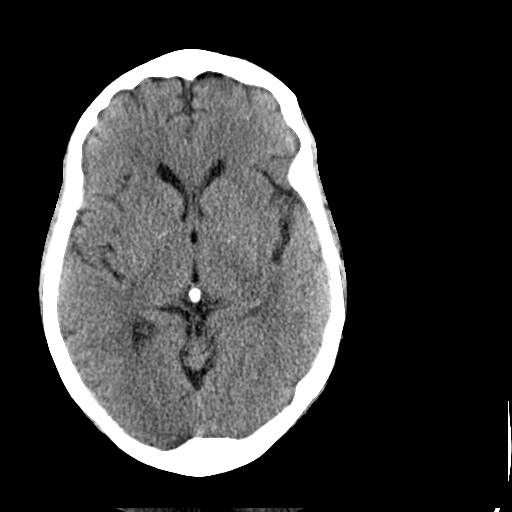
[im 16/30  brain]
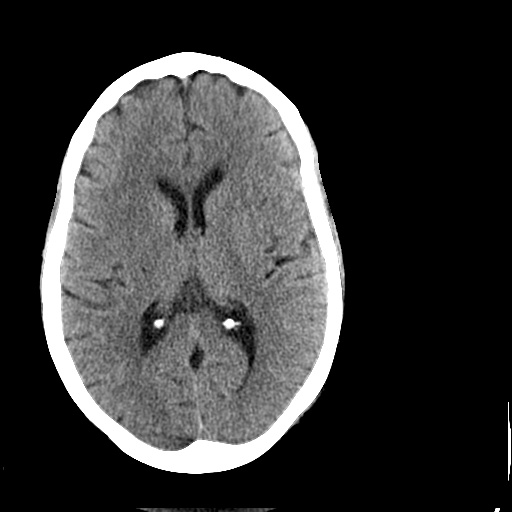
[im 17/30  brain]
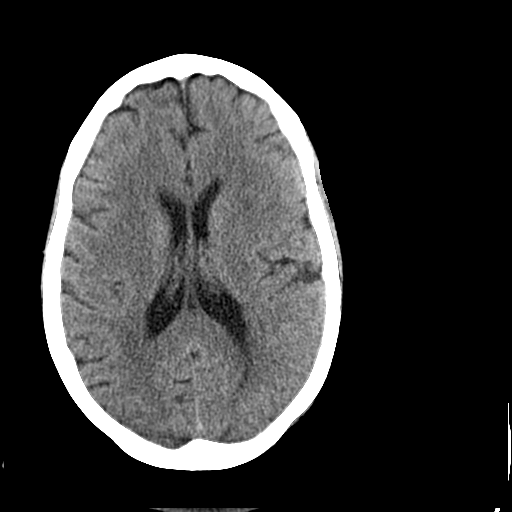
[im 17/30  bone]
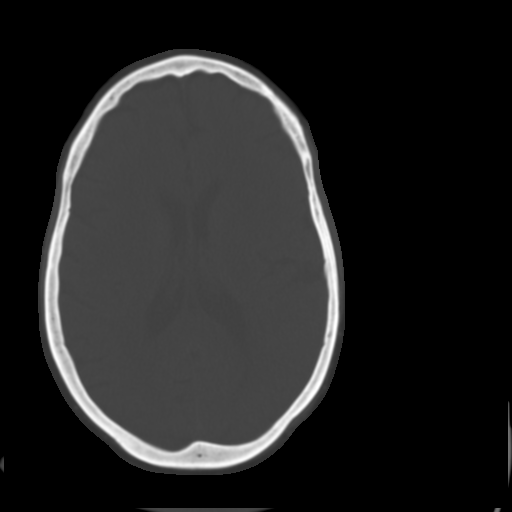
[im 19/30  brain]
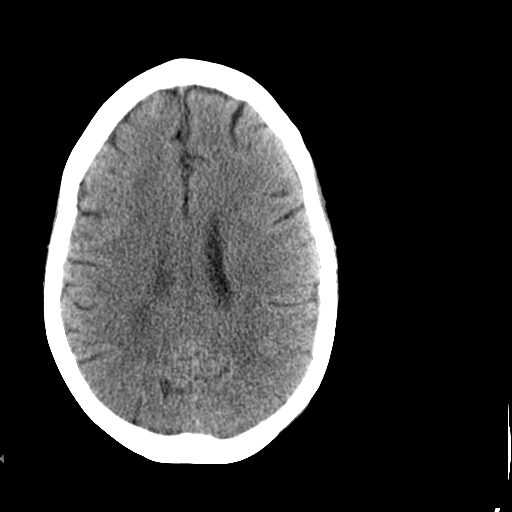
[im 21/30  brain]
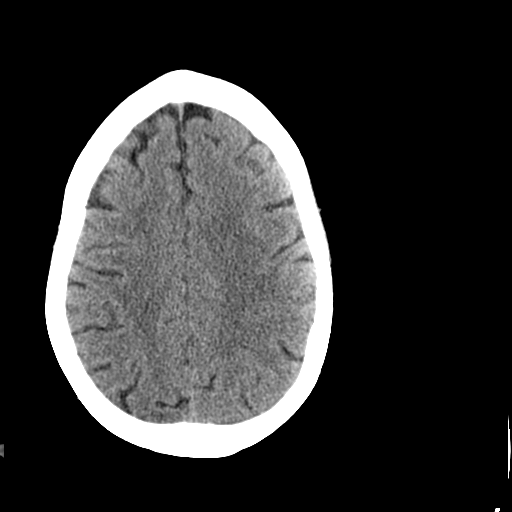
[im 23/30  brain]
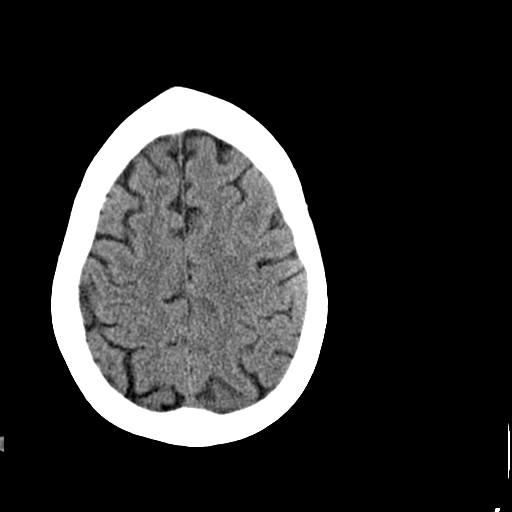
[im 25/30  brain]
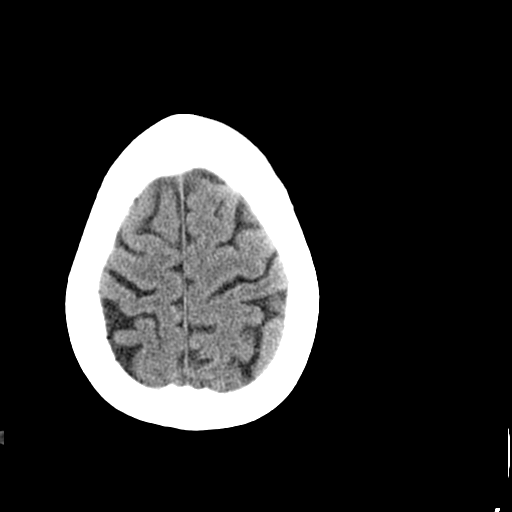
[im 25/30  bone]
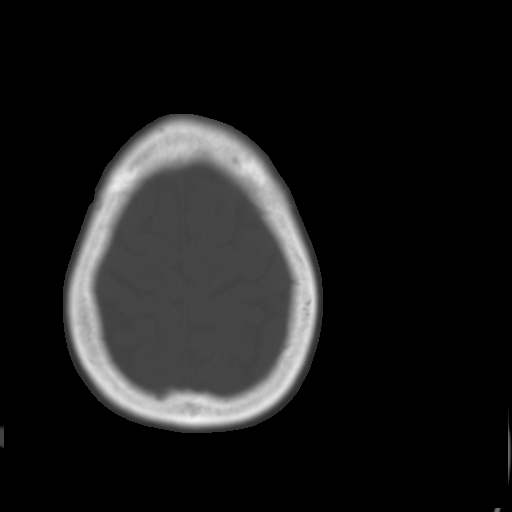
[im 27/30  brain]
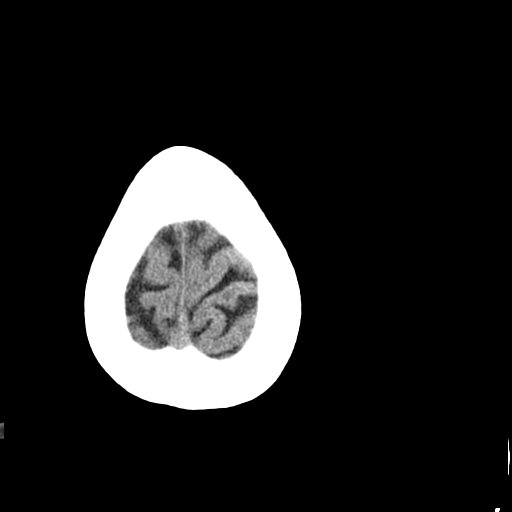
[im 29/30  brain]
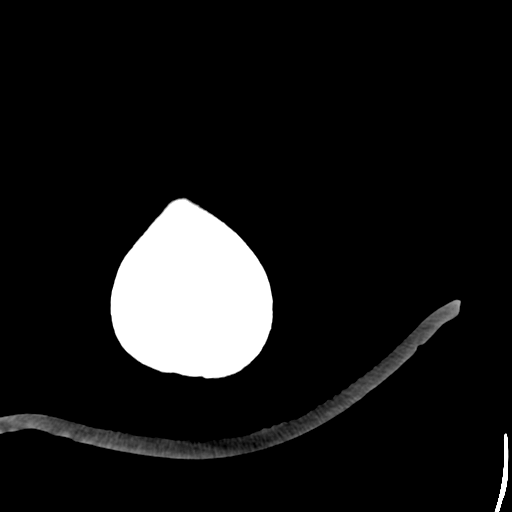

[15 of 30 positions shown; findings below may reference images not displayed]

FINDINGS: Air-fluid level present maxillary sinuses bilaterally.
The sphenoid sinuses and scattered throughout the ethmoid sinuses.
There is an air-fluid level in the right frontal sinus.  The
patient is intubated.  An NG tube enters via the right nasal
cavity.

No acute intracranial abnormality is present.  Specifically, there
is no evidence for acute infarct, hemorrhage, mass, hydrocephalus,
or extra-axial fluid collection.  Minimal subcortical white matter
hypoattenuation is seen bilaterally. The mastoid air cells are
clear.  The osseous skull is intact.
IMPRESSION: 1.  Minimal subcortical white matter disease bilaterally.  This is
nonspecific. The finding is nonspecific but can be seen in the
setting of chronic microvascular ischemia, a demyelinating process
such as multiple sclerosis, vasculitis, complicated migraine
headaches, or as the sequelae of a prior infectious or inflammatory
process.
2.  Air fluid levels scattered throughout the paranasal sinuses as
described.  This may be secondary to obstruction from the patient's
intubated status.

## 2010-02-11 IMAGING — CR DG CHEST 1V PORT
1 series · 1 of 1 positions shown · non-contrast
Comparison: Portable chest 06/21/2008.

CLINICAL DATA: Shortness of breath and wheezing.

PORTABLE CHEST - 1 VIEW

[view not recorded]
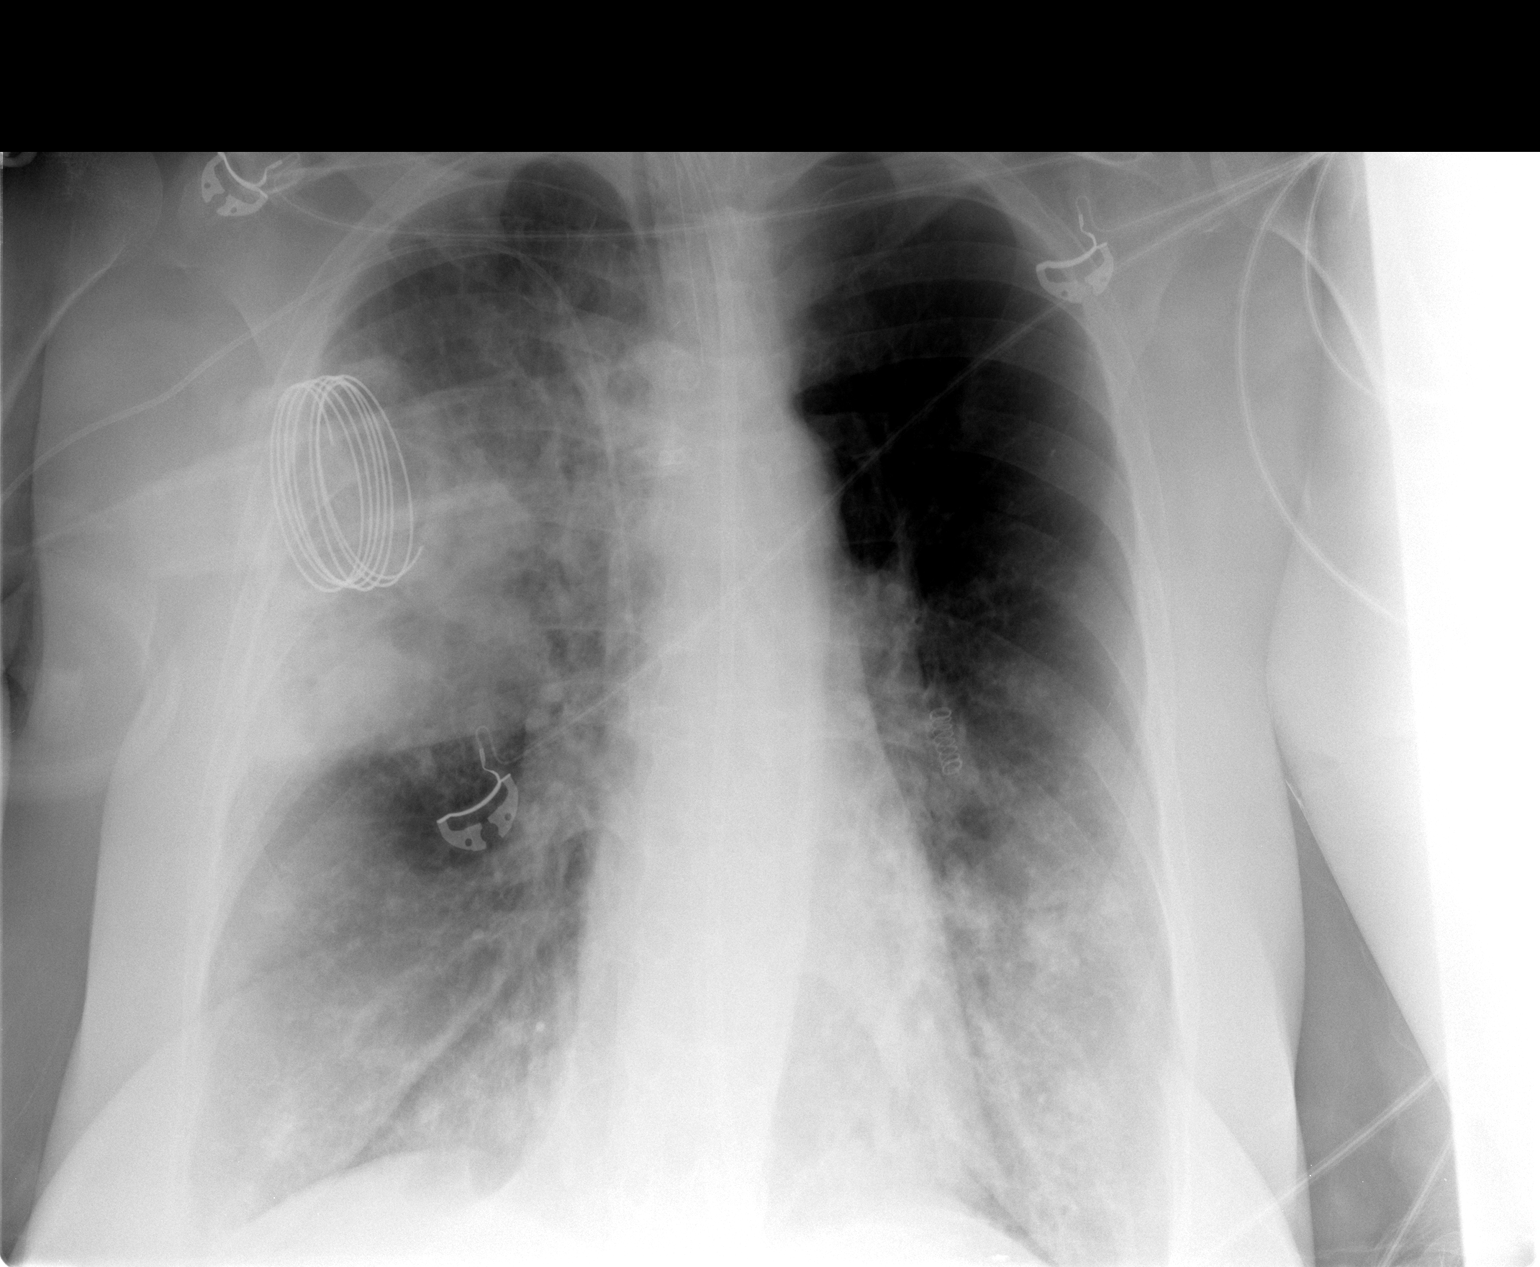

[1 of 1 positions shown; findings below may reference images not displayed]

FINDINGS: Support tubes and lines are unchanged.  There has been
marked worsening in airspace disease in the right upper lobe.
Airspace disease in the lung bases also has worsened.  Lungs are
emphysematous.  Heart size normal.
IMPRESSION: Marked worsening in bilateral airspace disease consistent with
worsening pneumonia.

## 2010-02-11 IMAGING — CR DG CHEST 1V PORT
1 series · 1 of 1 positions shown · non-contrast
Comparison: Portable chest x-ray of 06/23/2008

CLINICAL DATA: Short of breath, wheezing

PORTABLE CHEST - 1 VIEW

[view not recorded]
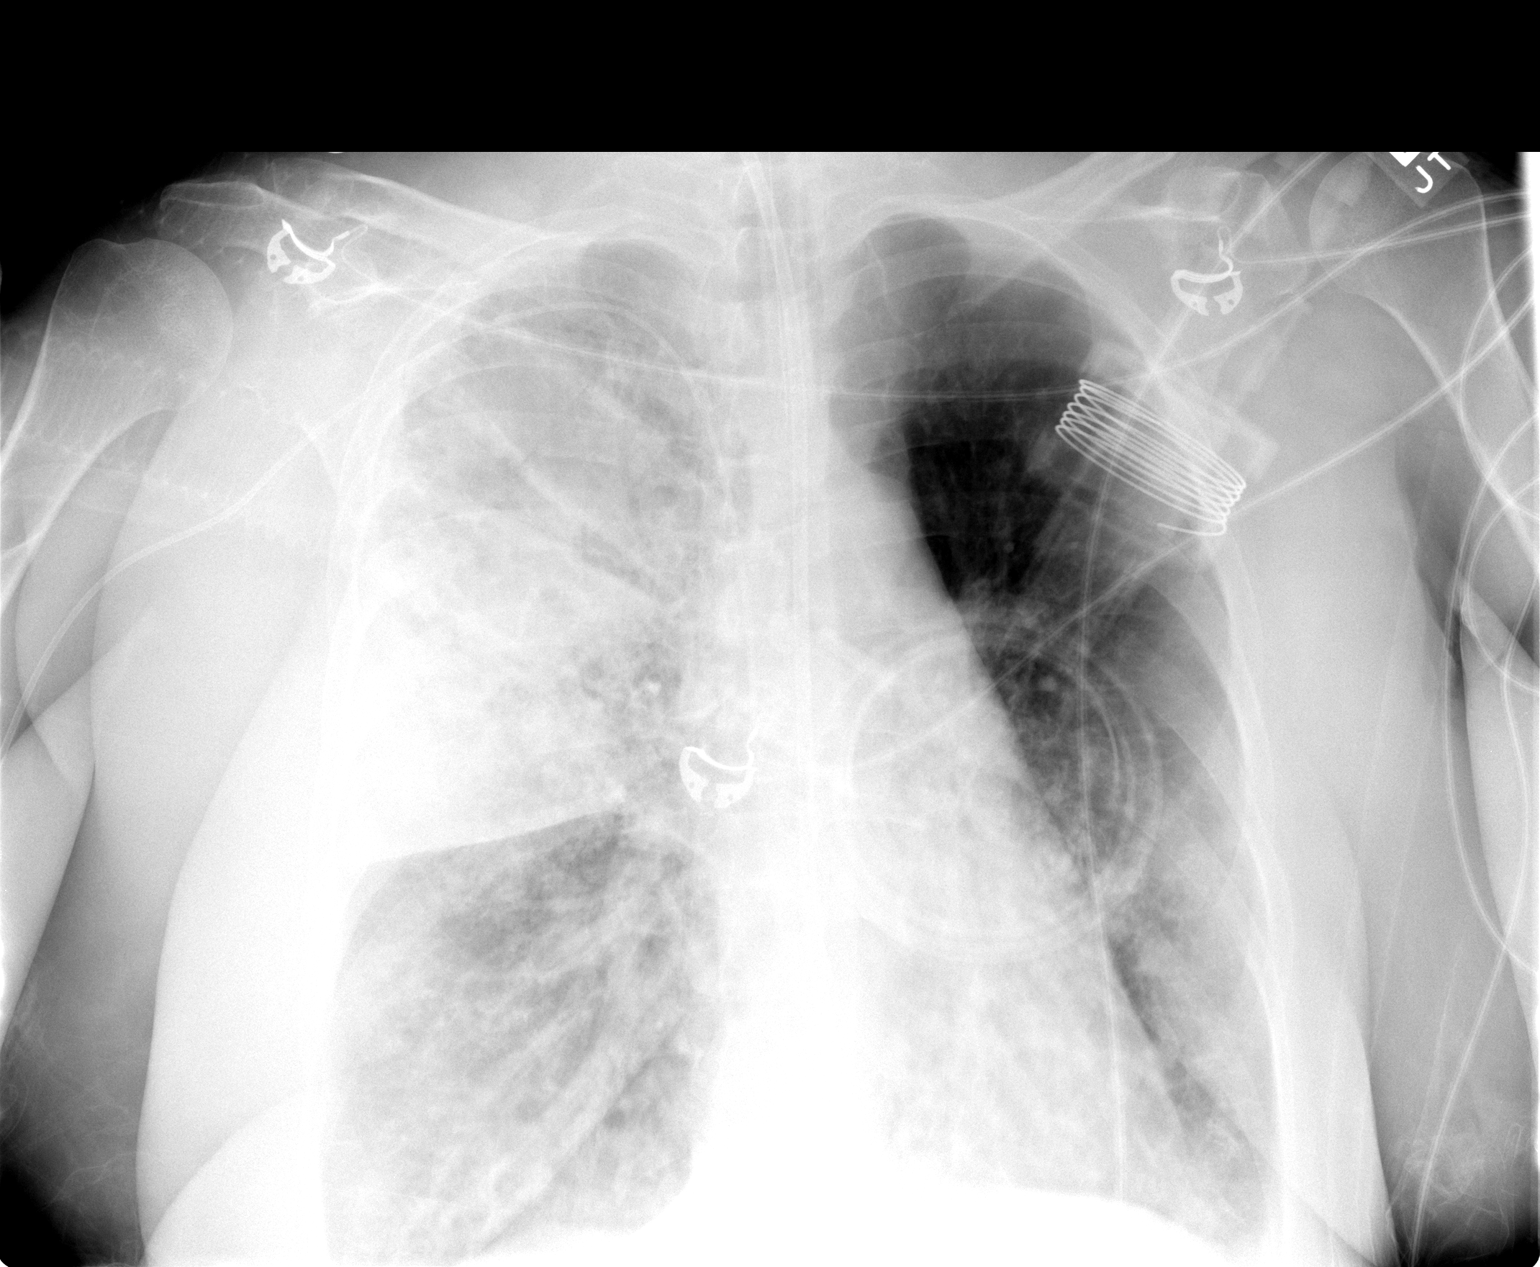

[1 of 1 positions shown; findings below may reference images not displayed]

FINDINGS: There is been further worsening of right upper lobe
opacity with air bronchograms most consistent with pneumonia.
Vague airspace disease also is noted throughout the lungs and has
increased somewhat especially in both lower lobes.  The tip of the
endotracheal tube is approximately 4.6 cm above the carina.  Right
arm PICC line tip is in the lower SVC.  No pneumothorax is seen.
Heart size is stable.
IMPRESSION: Apparent interval worsening of airspace disease particularly in the
right upper lobe most consistent with pneumonia.  Endotracheal tip
4.6 cm above carina.

## 2010-02-13 IMAGING — CR DG CHEST 1V PORT
1 series · 1 of 1 positions shown · non-contrast
Comparison: Portable exam 8688 hours compared to 06/24/1998

CLINICAL DATA: Congestion, respiratory distress, on ventilator

PORTABLE CHEST - 1 VIEW

[view not recorded]
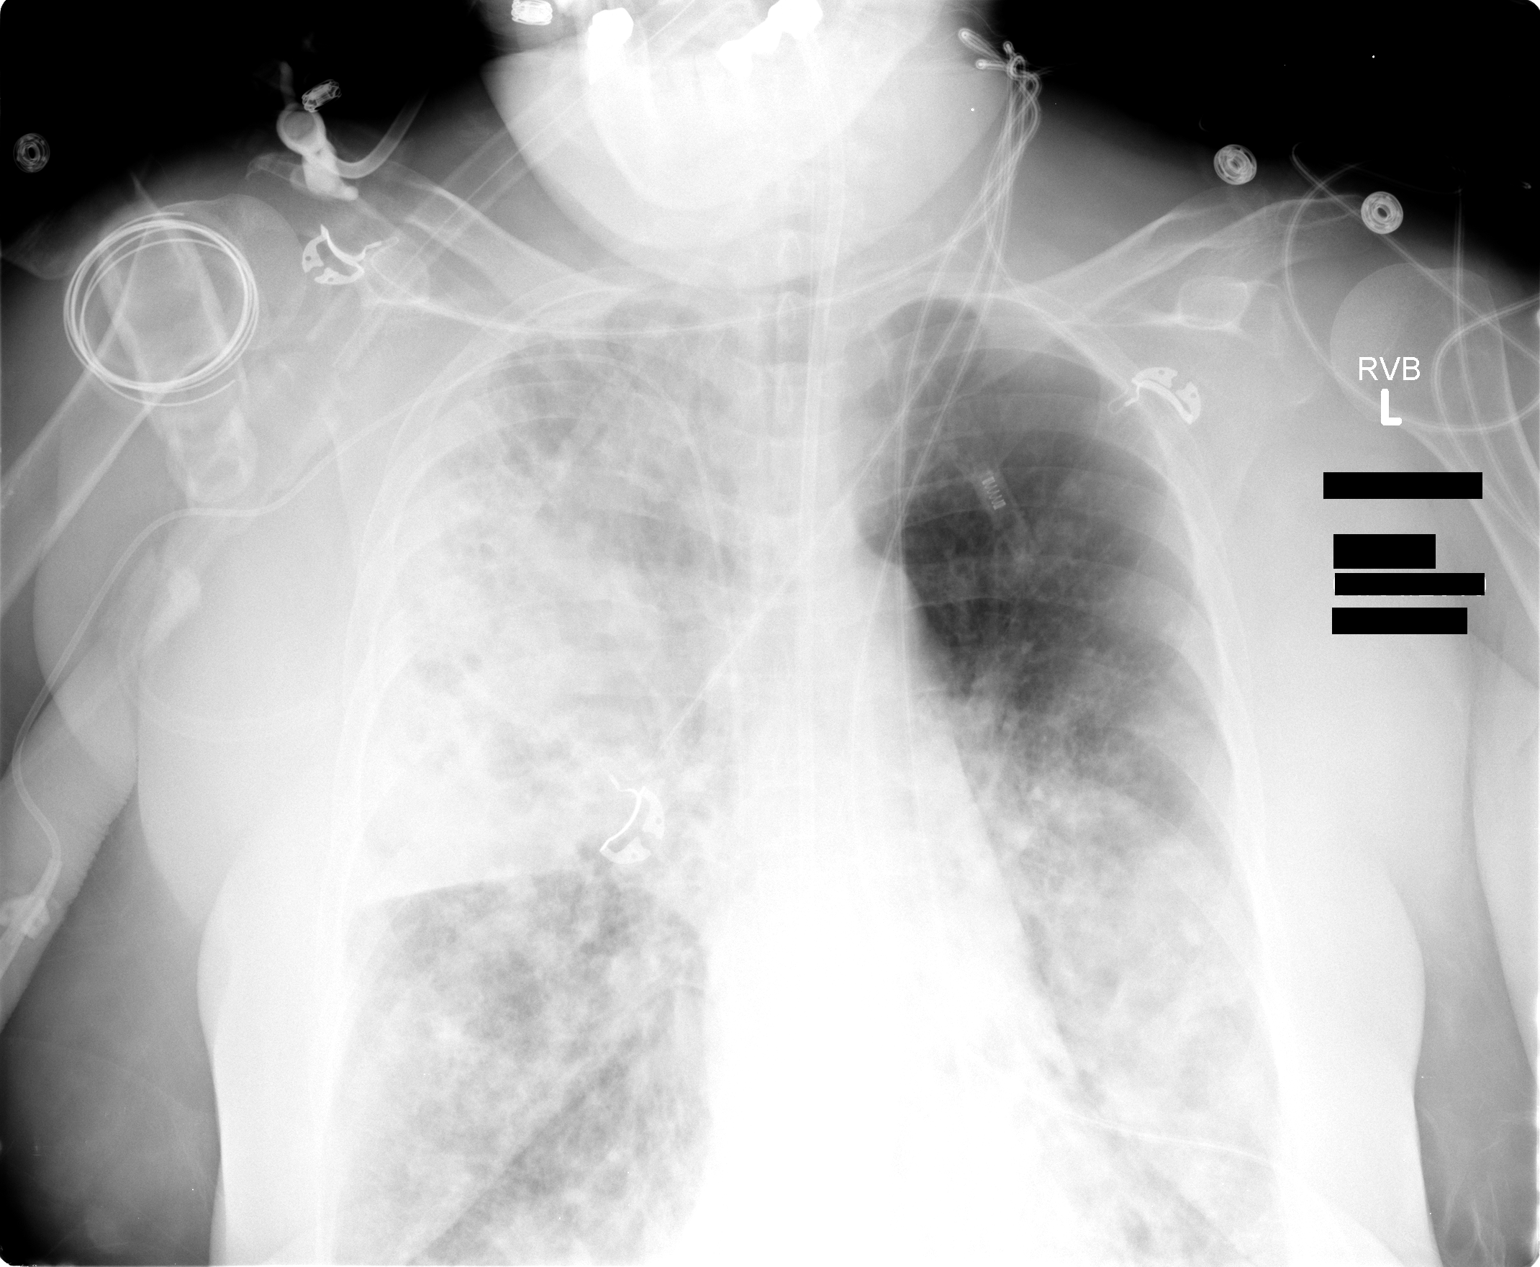

[1 of 1 positions shown; findings below may reference images not displayed]

FINDINGS: Endotracheal tube, feeding tube, and right arm PICC line stable.
Normal heart size mediastinal contours.
Extensive bilateral airspace infiltrates, greatest in right upper
lobe, appearing slightly more confluent than on previous study.
Costophrenic angles excluded.
No pneumothorax.
IMPRESSION: Slightly more confluent bilateral airspace infiltrates since
previous study.

## 2010-02-14 IMAGING — CR DG CHEST 1V PORT
1 series · 1 of 1 positions shown · non-contrast
Comparison: 06/26/2008 at to 0200 hours

CLINICAL DATA: Central line placement/respiratory failure

PORTABLE CHEST - 1 VIEW

[view not recorded]
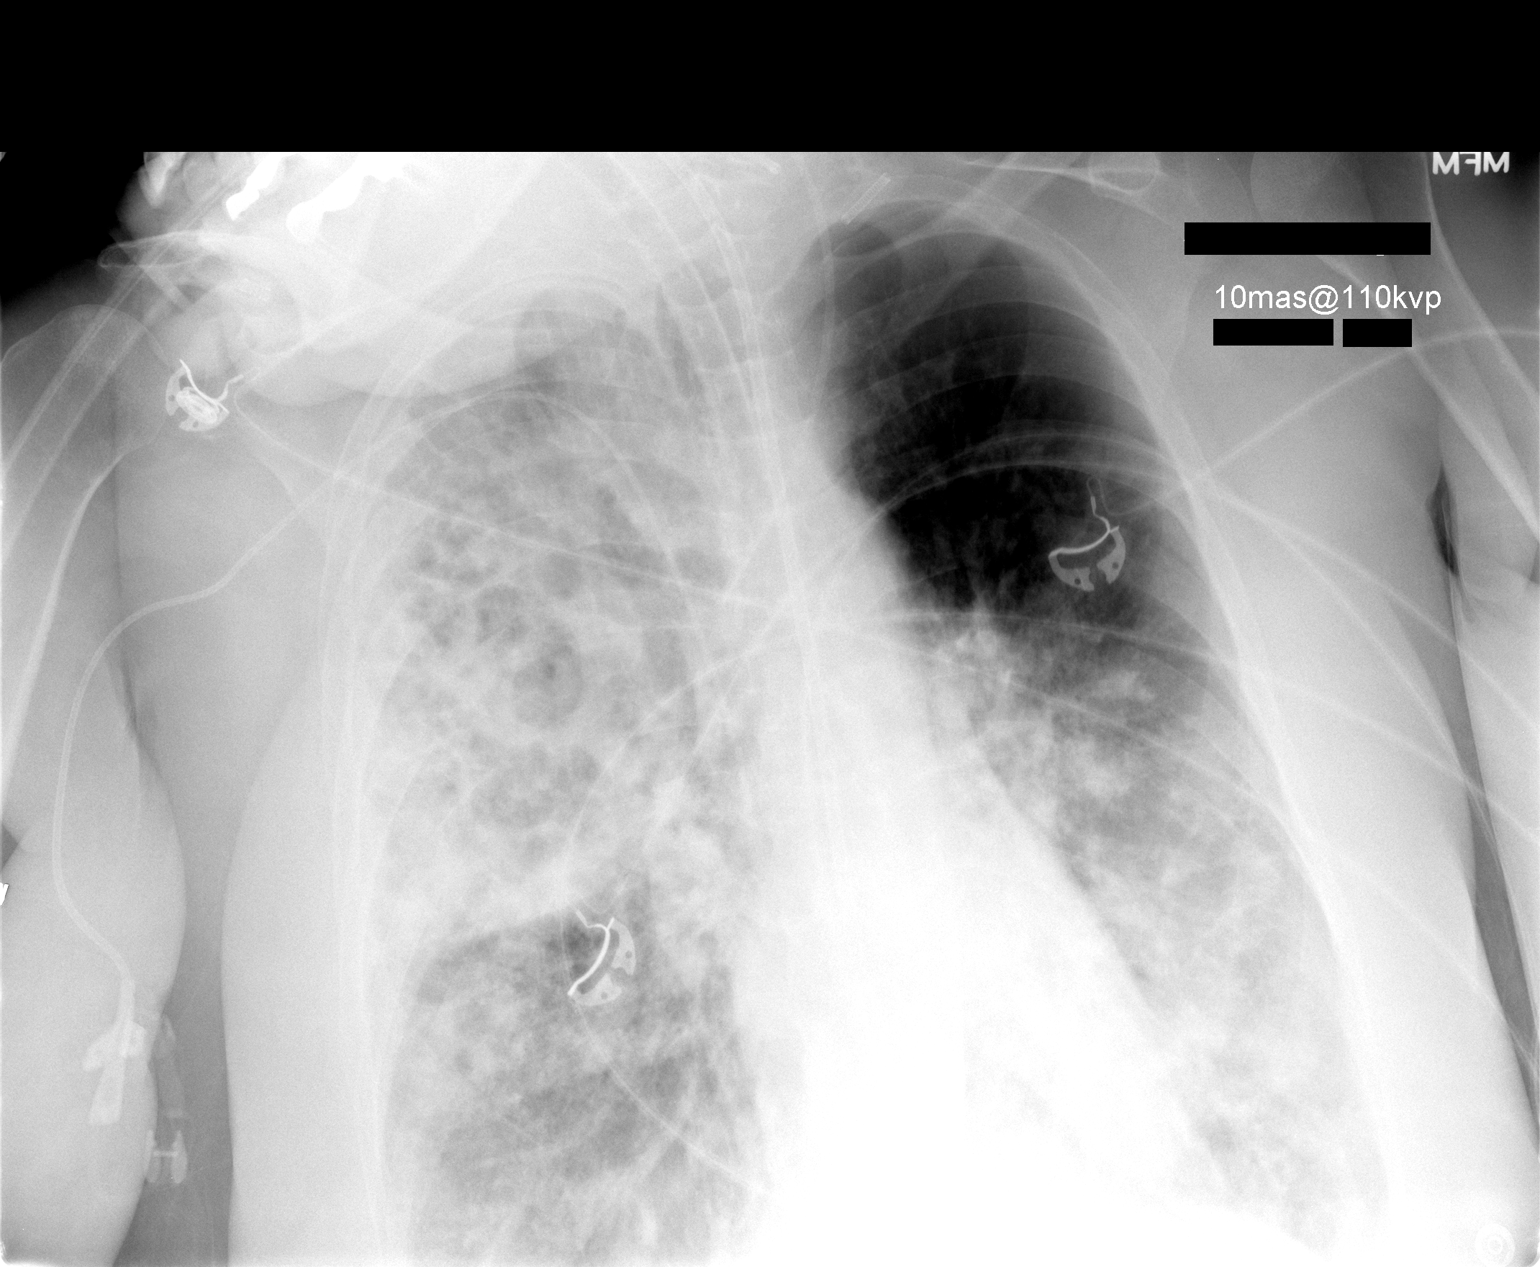

[1 of 1 positions shown; findings below may reference images not displayed]

FINDINGS: A new left jugular central line is in place.  The tip is
positioned at the confluence of the brachiocephalic veins, at the
origin of the SVC. The central line is noted to be coiled and
possibly kinked over the left neck.  This needs clinical
correlation.

No significant change in the heart or lungs with diffuse bilateral
airspace disease, sparing the left upper lobe.  No pneumothorax.
IMPRESSION: 1.  Left jugular central line placement with tip at the origin of
the SVC.  See note above regarding coiling and possibly kinking of
the central line over the left neck.
2.  No pneumothorax.
3.  No significant change in the bilateral airspace infiltrate.

## 2010-02-14 IMAGING — CR DG CHEST 1V PORT
1 series · 1 of 1 positions shown · non-contrast
Comparison: 06/25/2008 and earlier.

CLINICAL DATA: 57-year-old female with shortness of breath.  On the
ventilator.

PORTABLE CHEST - 1 VIEW

[view not recorded]
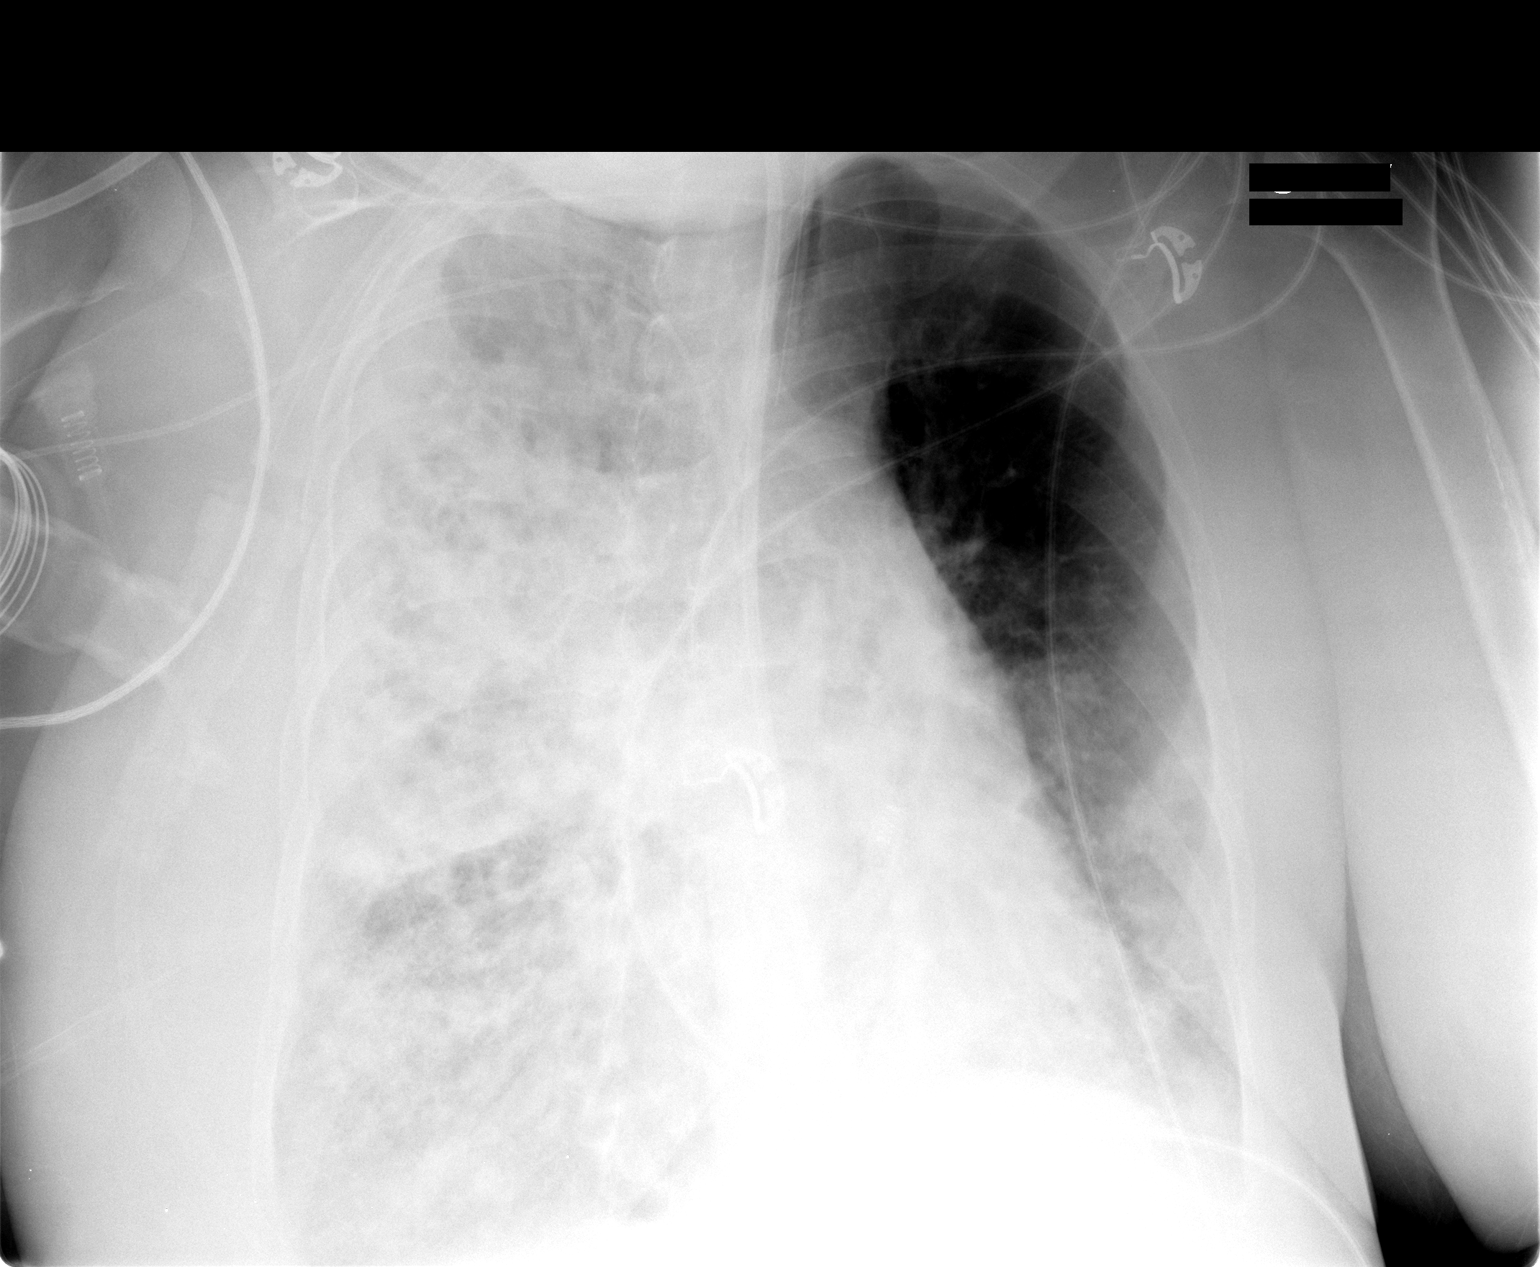

[1 of 1 positions shown; findings below may reference images not displayed]

FINDINGS: Portable semi upright AP view at 7147 hours.  Feeding
tube courses to the abdomen, tip not included.  The patient is more
rotated to the left.  Endotracheal tube tip just below the level of
the clavicles.  Stable right PICC line.  Diffuse abnormal pulmonary
opacity compatible with alveolar filling process with relative
sparing of the left upper lobe.  Stable cardiac size and
mediastinal contours.  No pneumothorax.  Probable small pleural
effusions.
IMPRESSION: 1. Stable lines and tubes.
2.  Diffuse abnormal pulmonary opacity with relative sparing of the
left upper lobe appears not significantly changed.  Given the acute
onset, widespread nature, severe infection is favored.  Consider
also ARDS.

## 2010-02-15 IMAGING — RF DG INTRO LONG GI TUBE
1 series · 2 of 2 positions shown · IV contrast (agent unspecified)
Comparison: Abdomen film of 06/27/2008

CLINICAL DATA: Repositioned feeding tube

LONG GI TUBE PLACMENT
Fluoroscopy Time: 7.0 minutes
Contrast: 10 ml of water soluble contrast

[Series 1: run · 2 of 2 slices shown]
[im 1/2]
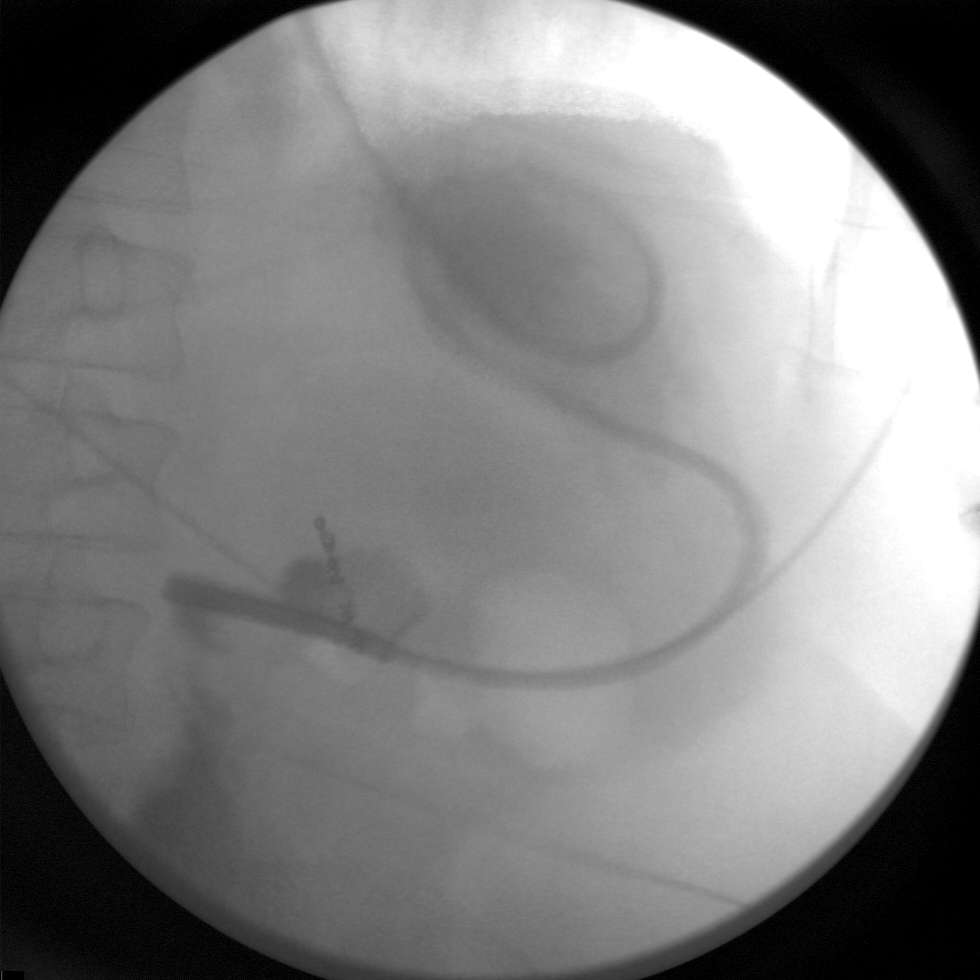
[im 2/2]
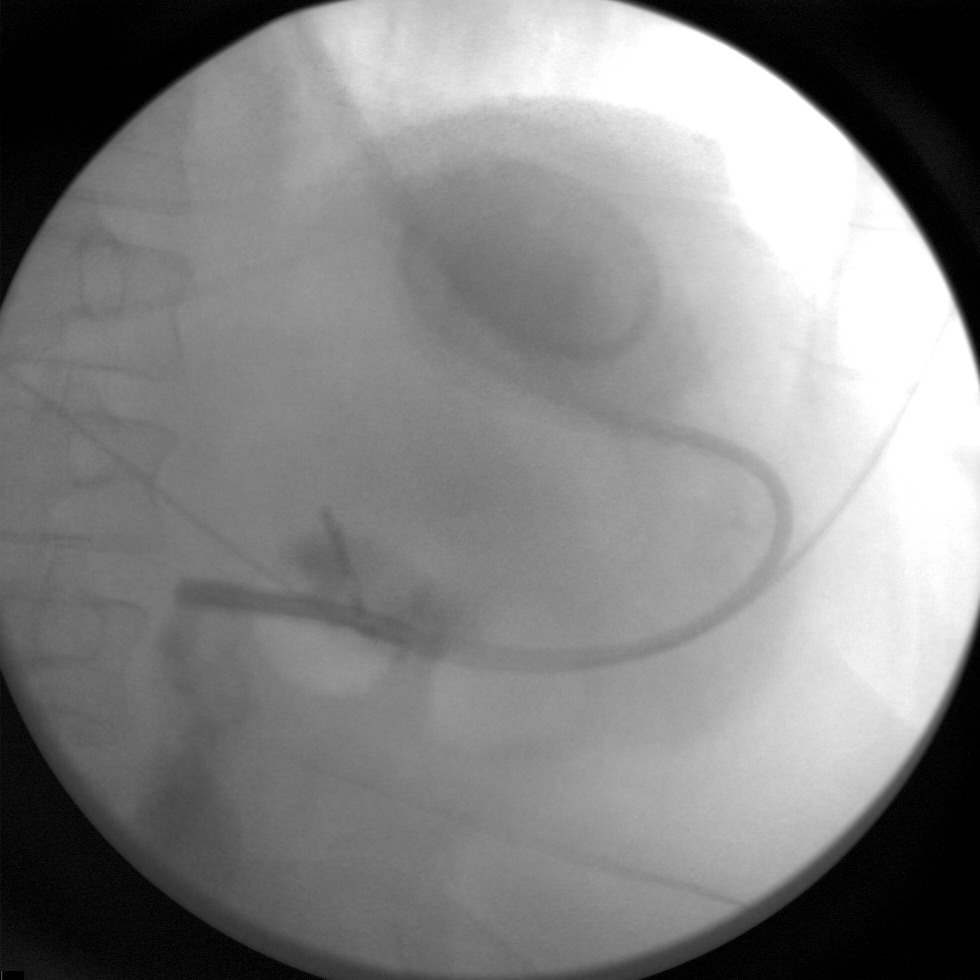

[2 of 2 positions shown; findings below may reference images not displayed]

FINDINGS: Under C-arm fluoroscopy, the feeding tube tip was
advanced into the duodenal bulb and into the proximal duodenum,
with a loop left in the fundus for further advancement.
IMPRESSION: Tip of feeding tube placed just beyond the duodenal bulb in the
descending duodenum.

REF:W2 DICTATED: 06/27/2008 [DATE]

## 2010-02-16 IMAGING — CR DG CHEST 1V PORT
1 series · 1 of 1 positions shown · non-contrast
Comparison: 06/26/2008 and earlier.

CLINICAL DATA: 57-year-old female with respiratory failure.

PORTABLE CHEST - 1 VIEW

[view not recorded]
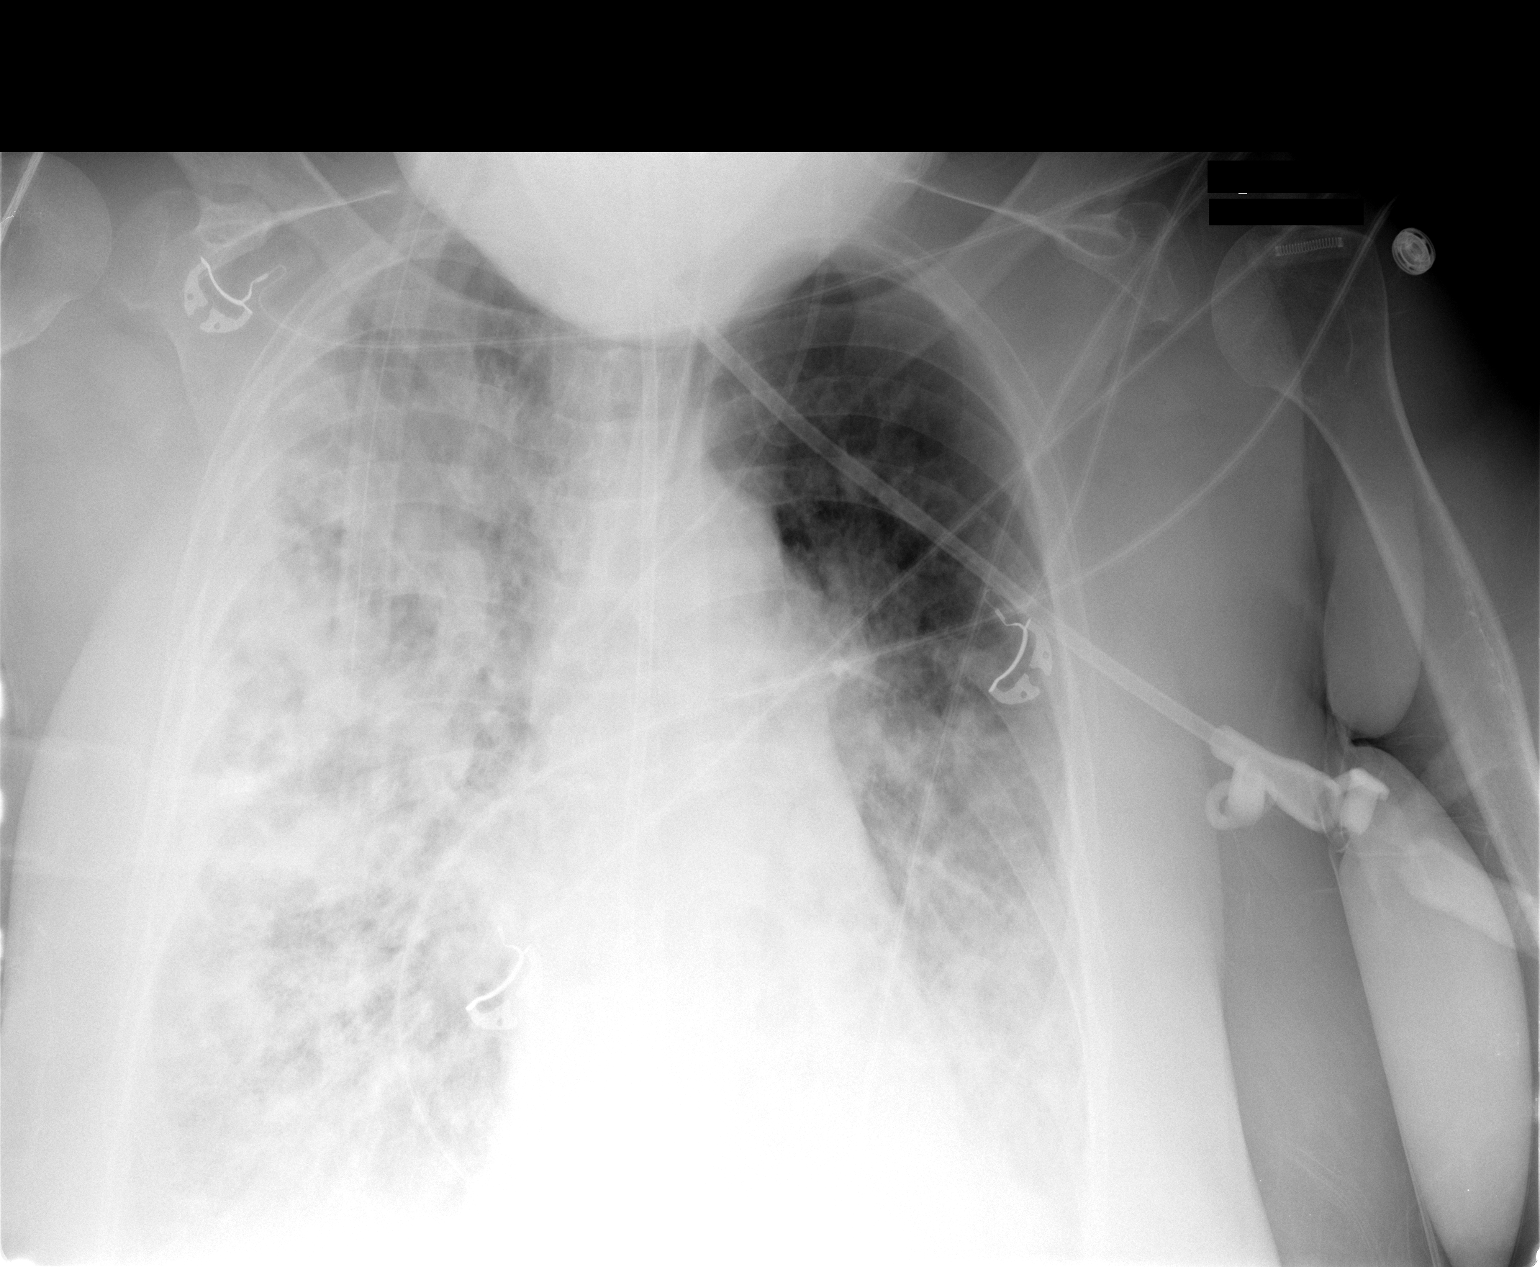

[1 of 1 positions shown; findings below may reference images not displayed]

FINDINGS: Portable semi upright AP view 7897 hours.  Stable ET
tube.  Enteric tube courses to the abdomen, tip not included.
Stable left IJ catheter.  Stable cardiac size and mediastinal
contours.  Stable lung volumes.  Interval worsening ventilation in
the right lower lobe, otherwise no significant change in extensive
bilateral airspace disease.
IMPRESSION: 1. Stable lines and tubes.
2.  Interval worsening right lower lobe ventilation, otherwise
unchanged.

## 2010-02-17 IMAGING — CR DG CHEST 1V PORT
1 series · 1 of 1 positions shown · non-contrast
Comparison: 06/28/2008 and earlier.

CLINICAL DATA: 57-year-old female with respiratory distress.

PORTABLE CHEST - 1 VIEW

[view not recorded]
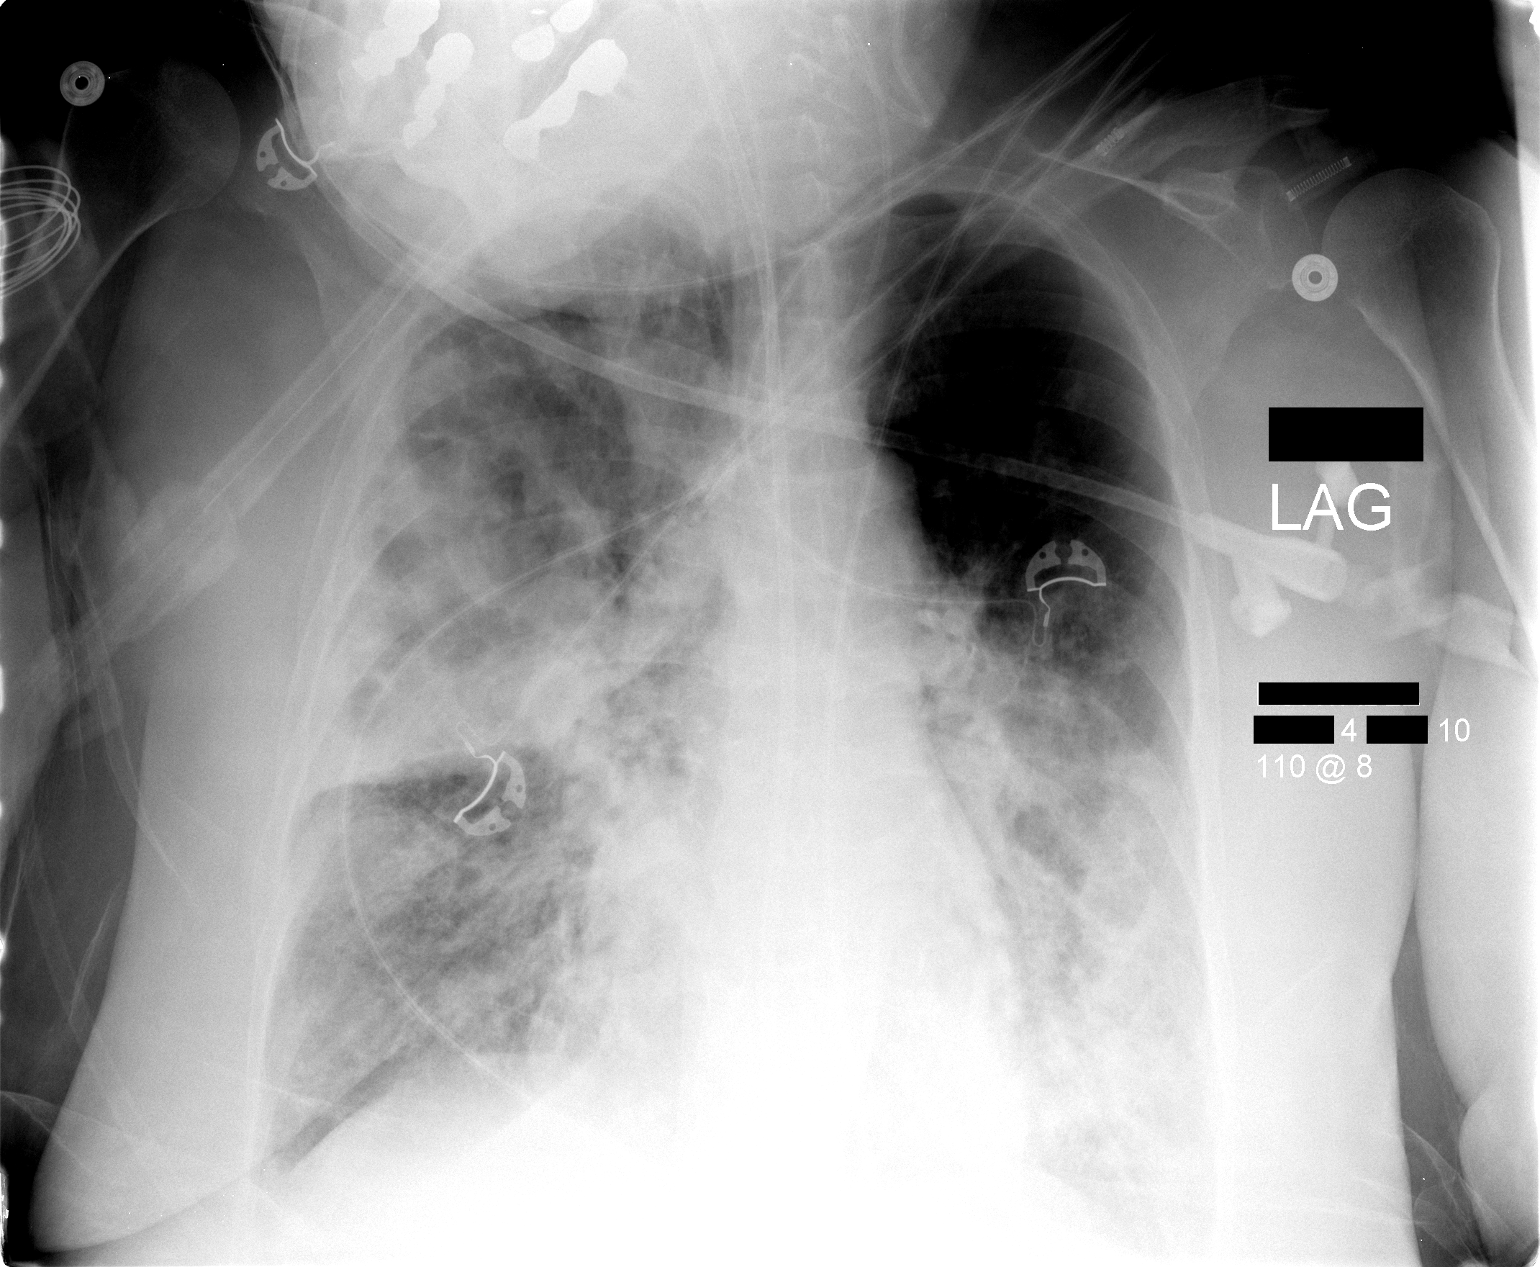

[1 of 1 positions shown; findings below may reference images not displayed]

FINDINGS: Portable AP view 9101 hours.  Unchanged endotracheal
tube, visualized enteric tube and left IJ catheter.  Stable cardiac
size and mediastinal contours.  Mildly improved ventilation
bilaterally with persistent extensive airspace opacity.  Left upper
lobe remains relatively spared.  No pneumothorax.  Possible pleural
effusion layering along the right minor fissure.
IMPRESSION: 1. Stable lines and tubes.
2.  Mildly improved ventilation.  Ongoing airspace disease, sparing
the left upper lobe.  Question small right pleural effusion.

## 2010-02-17 IMAGING — CR DG ABD PORTABLE 1V
1 series · 1 of 1 positions shown · non-contrast
Comparison: 06/27/2008

CLINICAL DATA: Shortness of breath.  Wheezing.  Abdominal
distention.  Question abnormal bowel gas pattern.

ABDOMEN - 1 VIEW

[view not recorded]
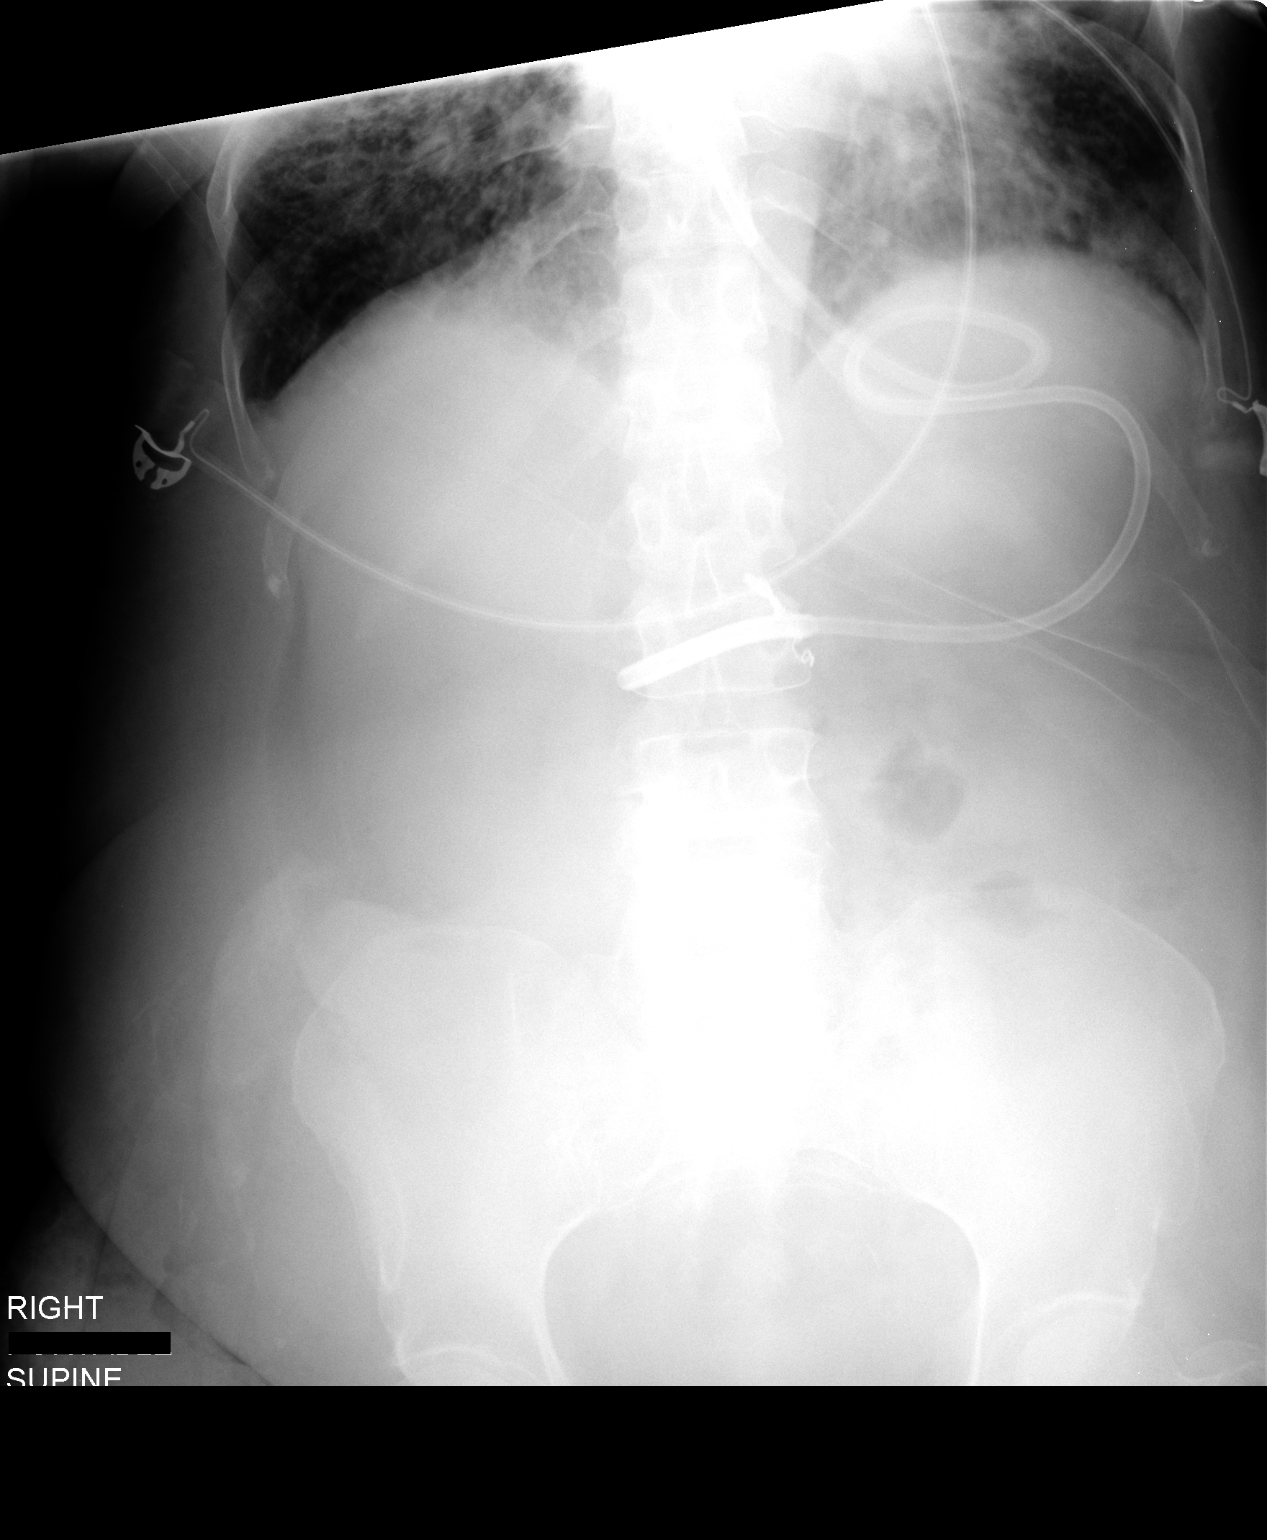

[1 of 1 positions shown; findings below may reference images not displayed]

FINDINGS: Feeding tube is in place with tip in the region of the
distal stomach.  The bowel gas pattern is nonobstructive.  There is
a minimal bowel gas in the left central abdomen, within nondilated
loops.  No evidence for free intraperitoneal air on this portable
supine view.  Small amount of residual contrast is identified
within bowel loops in the right abdomen.
IMPRESSION: Nonspecific bowel gas pattern.

REF:W2 DICTATED: 06/29/2008 [DATE]

## 2010-02-19 IMAGING — CR DG CHEST 1V PORT
1 series · 1 of 1 positions shown · non-contrast
Comparison: 06/29/2008

CLINICAL DATA: History failure

PORTABLE CHEST - 1 VIEW

[view not recorded]
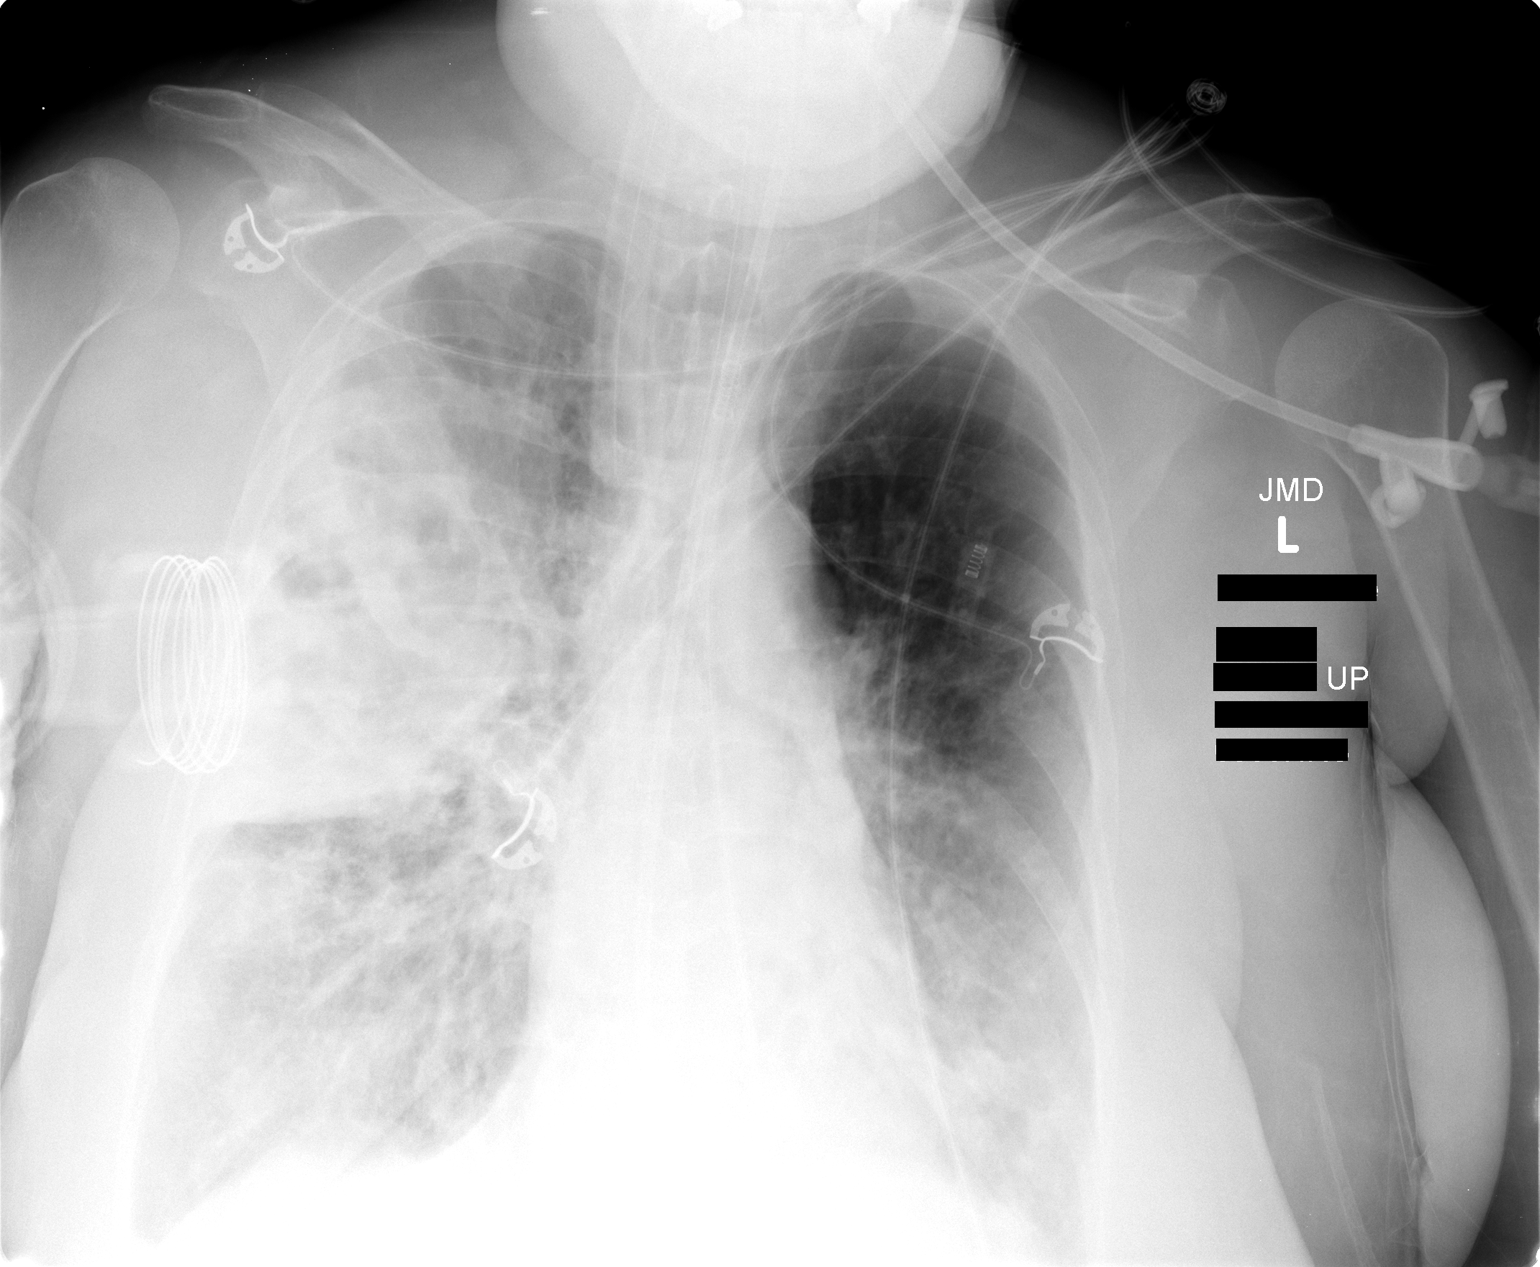

[1 of 1 positions shown; findings below may reference images not displayed]

FINDINGS: There is stable positioning of an endotracheal tube with
the tip lying approximately 3 cm above the carina.  Bilateral
airspace disease is relatively stable with potentially mild
increased density of the marginated right upper lobe consolidation.
Appearance over time does suggest the presence of increasing
cavitation within this process with findings suggestive of
cavitary/necrotic pneumonia.  No significant edema.  Heart size is
stable.  No significant pleural effusions.
IMPRESSION: Relatively stable bilateral airspace disease with persistent dense
consolidation of the right upper lobe demonstrating likely large
internal areas of cavitation.

REF:W2 DICTATED: 07/01/2008 [DATE]

## 2010-02-20 IMAGING — CR DG CHEST 1V PORT
1 series · 1 of 1 positions shown · non-contrast
Comparison: Portable exam 2522 hours compared to 07/01/2008

CLINICAL DATA: Pneumonia, shortness of breath

PORTABLE CHEST - 1 VIEW

[view not recorded]
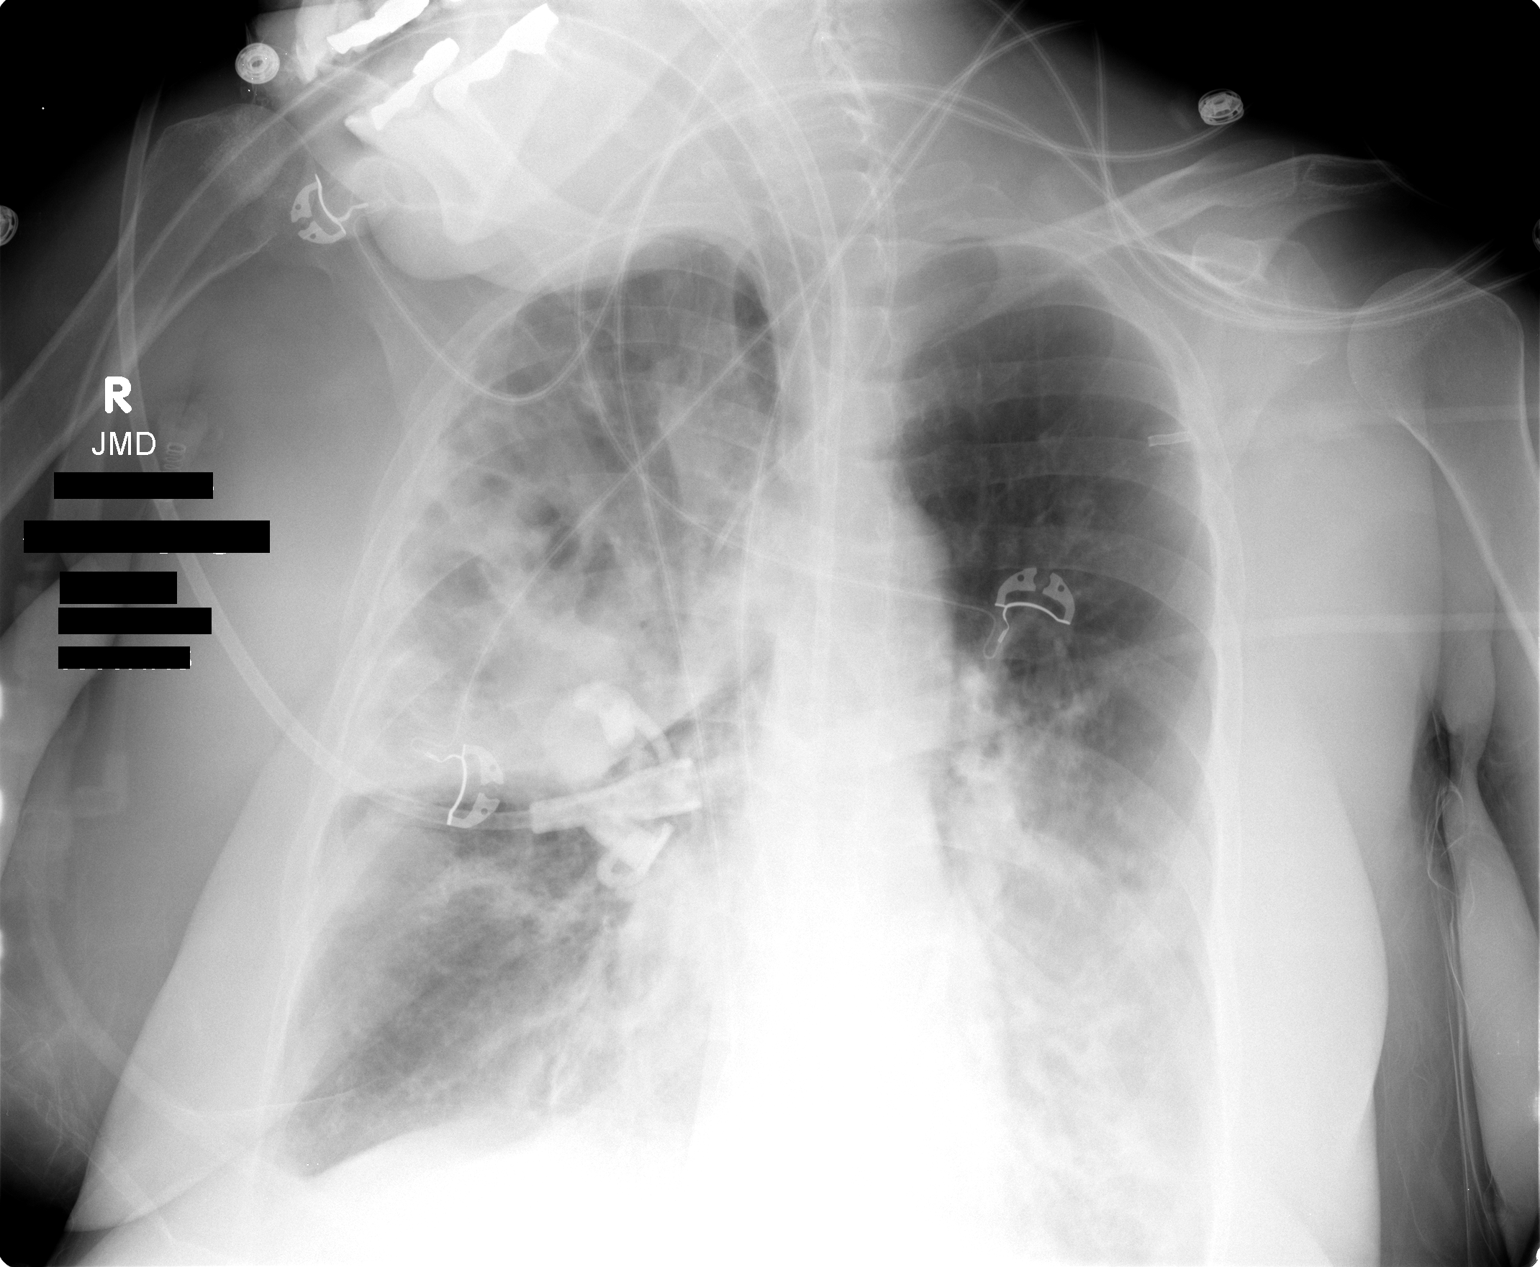

[1 of 1 positions shown; findings below may reference images not displayed]

FINDINGS: Tip of endotracheal tube 6 cm above carina.
Tip of left jugular line in SVC.
Feeding tube extends into abdomen.
Severely rotated to the right.
Extensive infiltrate right upper lobe little changed.
Focal lucencies within the right upper lobe infiltrate likely
represent cavitation.
Persistent bilateral lower lobe infiltrates, with left upper lobe
relatively clear.
IMPRESSION: Little interval change in bilateral pulmonary infiltrates and
suspected right upper lobe cavitation.
Consider follow-up CT chest when the patient's condition permits.

## 2010-02-20 IMAGING — CT CT CHEST W/O CM
2 of 3 series · 15 of 36 positions shown, 18 images · non-contrast
Comparison: Multiple recent chest x-ray exams.

CLINICAL DATA: Respiratory failure and progressive pneumonia
demonstrating evidence of cavitation by chest x-ray.

CT CHEST WITHOUT CONTRAST
TECHNIQUE: Multidetector CT imaging of the chest was performed
following the standard protocol without IV contrast.

[Series 2: chest_routine 5.0 b40f st · axial · 0.61mm/px · z∈[-255,-50]mm · 12 of 49 slices shown, 15 images]
[im 4/49  mediastinal]
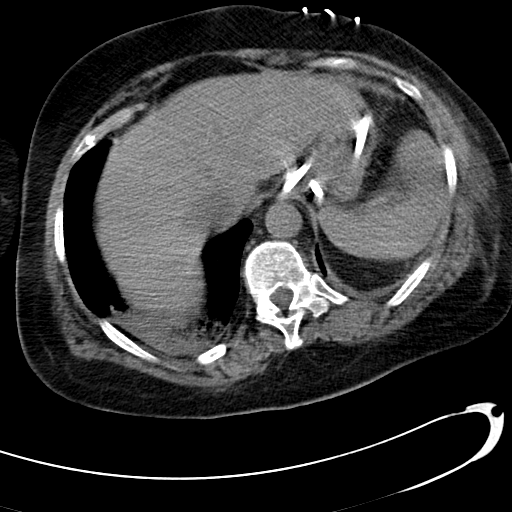
[im 4/49  lung]
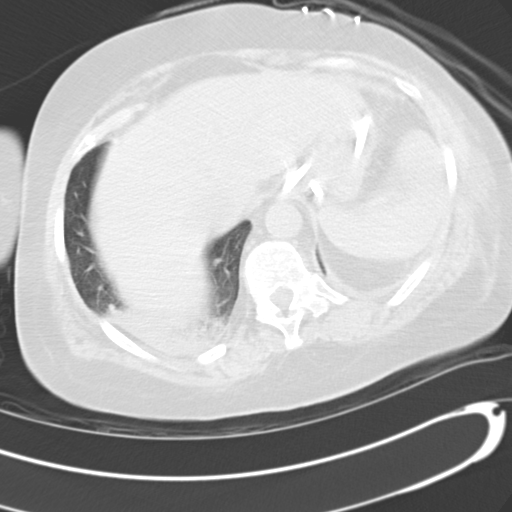
[im 8/49  lung]
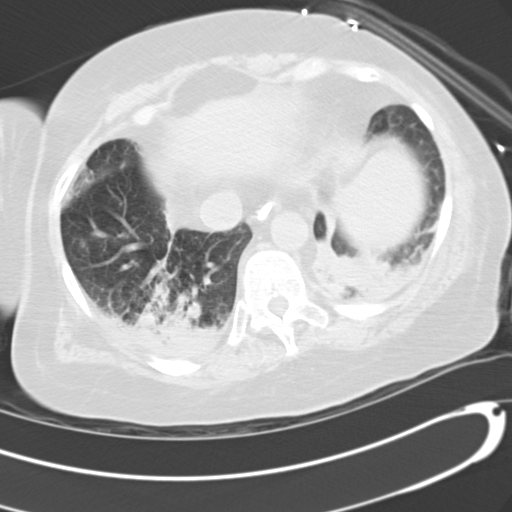
[im 11/49  lung]
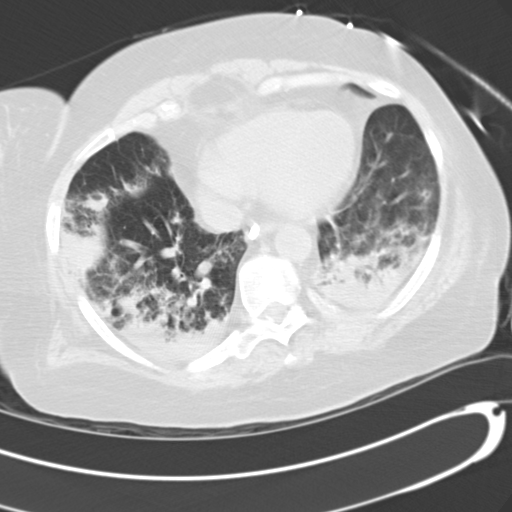
[im 15/49  lung]
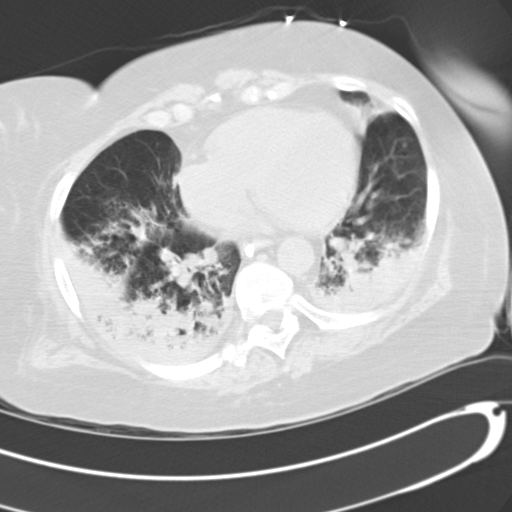
[im 18/49  mediastinal]
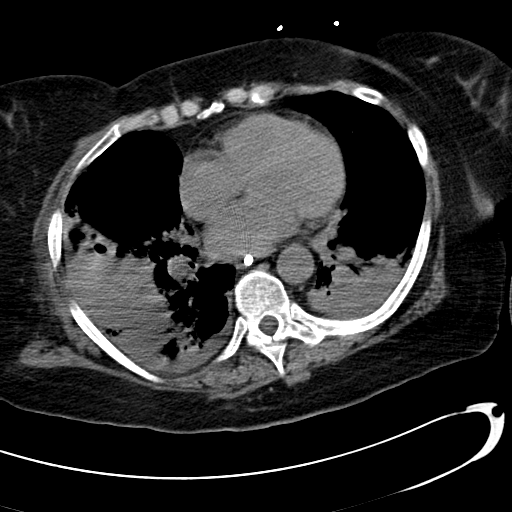
[im 18/49  lung]
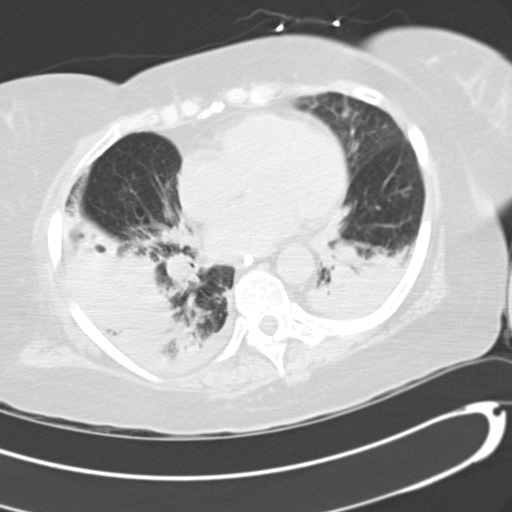
[im 22/49  lung]
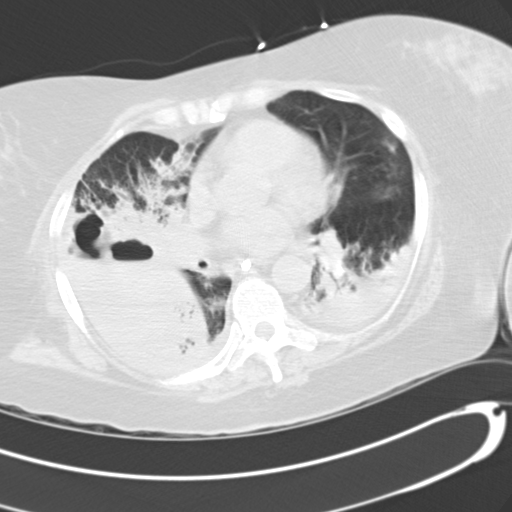
[im 27/49  lung]
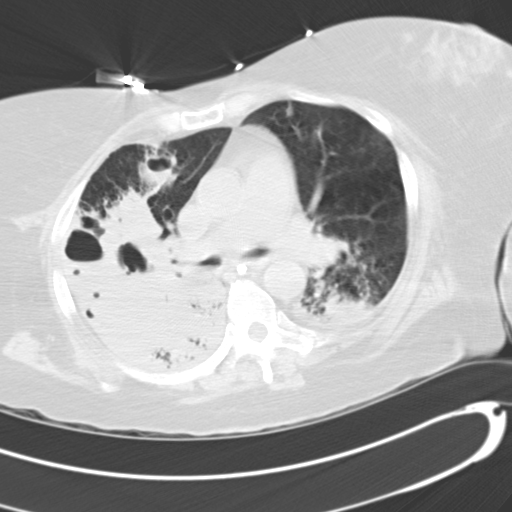
[im 31/49  lung]
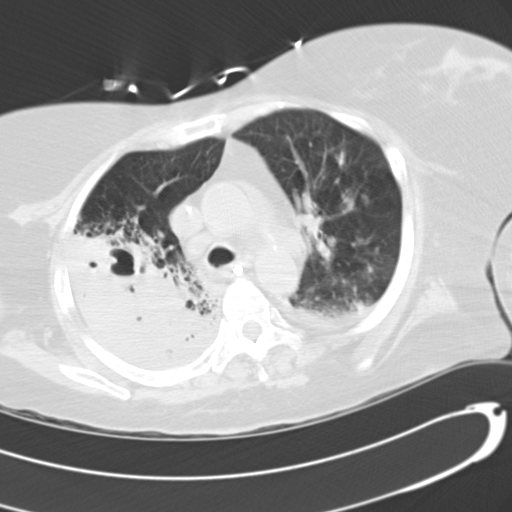
[im 34/49  mediastinal]
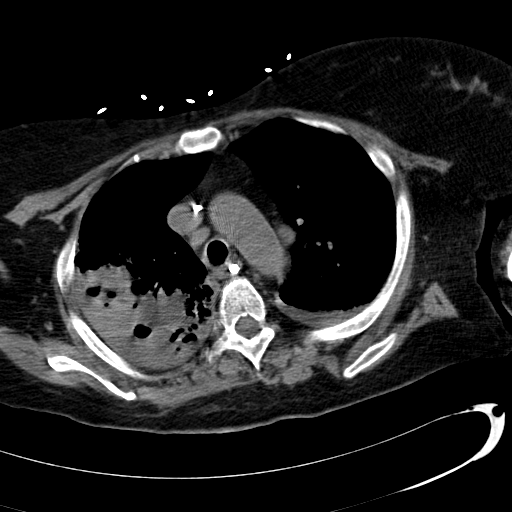
[im 34/49  lung]
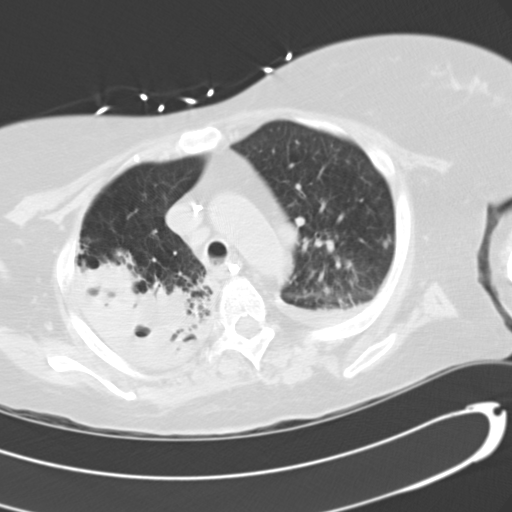
[im 38/49  lung]
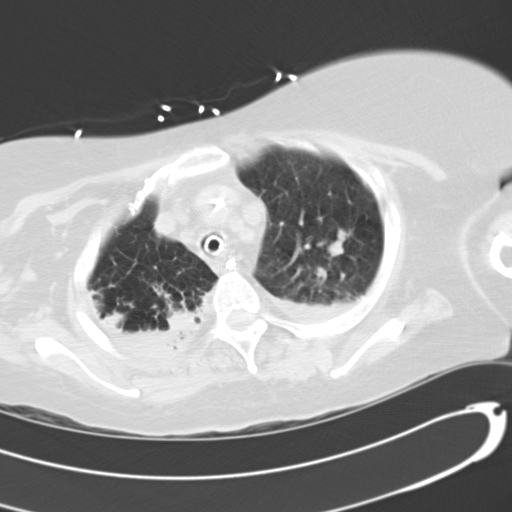
[im 41/49  lung]
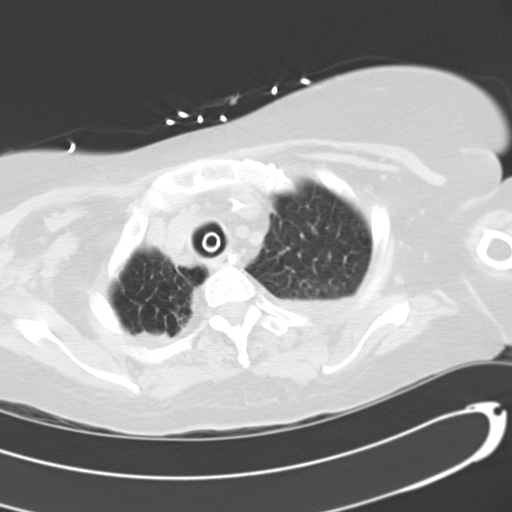
[im 45/49  lung]
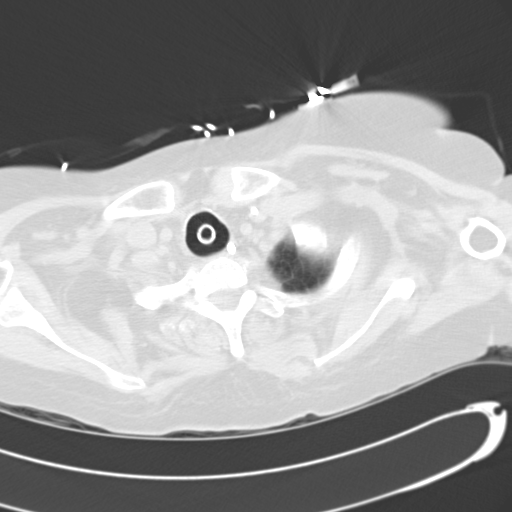

[Series 602: coronal · coronal · 0.61mm/px · 3 of 91 slices shown]
[im 19/91  lung]
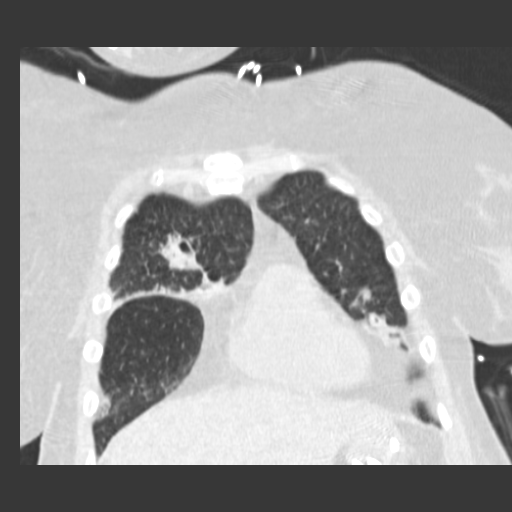
[im 37/91  lung]
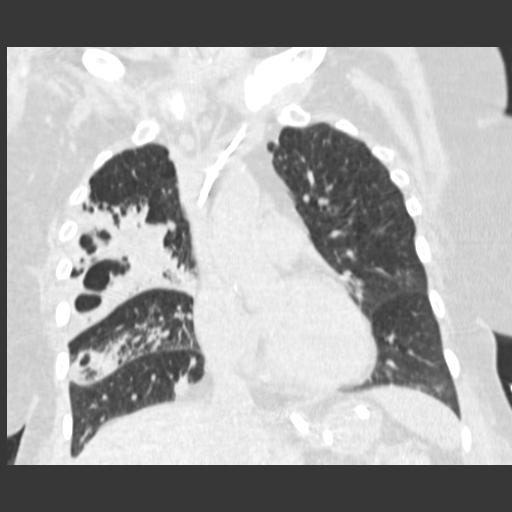
[im 55/91  lung]
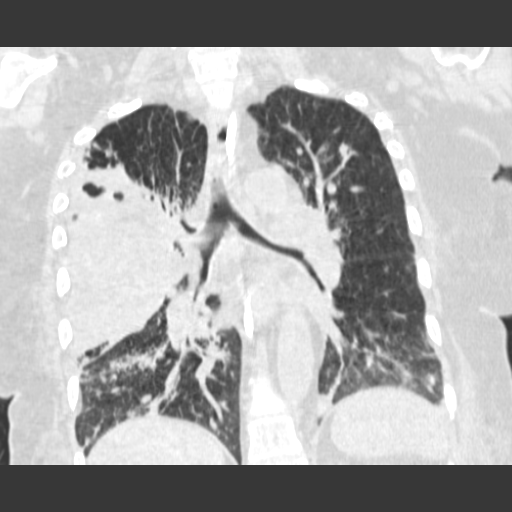

[15 of 36 positions shown; findings below may reference images not displayed]

FINDINGS: Extensive consolidative pneumonia is seen involving
nearly the entire right upper lobe and sparing only some of the
anterior upper lobe parenchyma and apex.  Within the consolidated
lung are numerous cavities containing air and air fluid levels.
Additional areas of more focal rounded cavitation are also present
anteriorly in the right upper lobe and focally in the right middle
lobe.  The areas of cavitation do appear to be within the lung
parenchyma and there is no convincing evidence by CT of an obvious
bronchopleural fistula.  A trace amount of associated pleural fluid
is present on the right.  There also is extension of pneumonia into
the posterior right lower lobe without current lower lobe necrosis
or cavitation.

On the left side a small pleural effusion is present.  There is
pneumonia in the posterior left lower lobe and also in the lingula.
Some patchy airspace disease also extends into the inferior aspect
of the left upper lobe.

There is no evidence of pneumothorax.  Mildly prominent lymph nodes
are present in the mediastinum which are likely reactive.  No
endobronchial lesions are detected.
IMPRESSION: Extensive necrotizing pneumonia predominately involving the right
upper lobe.  Multiple parenchymal cavities are present with several
containing air fluid levels.  Additional areas of cavitation are
present in the right middle lobe.  Pneumonia also involves both
lower lobes, lingula and left upper lobe.  No significant component
of pleural fluid is seen with a trace amount of fluid on the right
and a very small left pleural effusion.

## 2010-02-22 IMAGING — CR DG CHEST 1V PORT
1 series · 1 of 1 positions shown · non-contrast
Comparison: 07/02/2008

CLINICAL DATA: Respiratory distress

PORTABLE CHEST - 1 VIEW

[view not recorded]
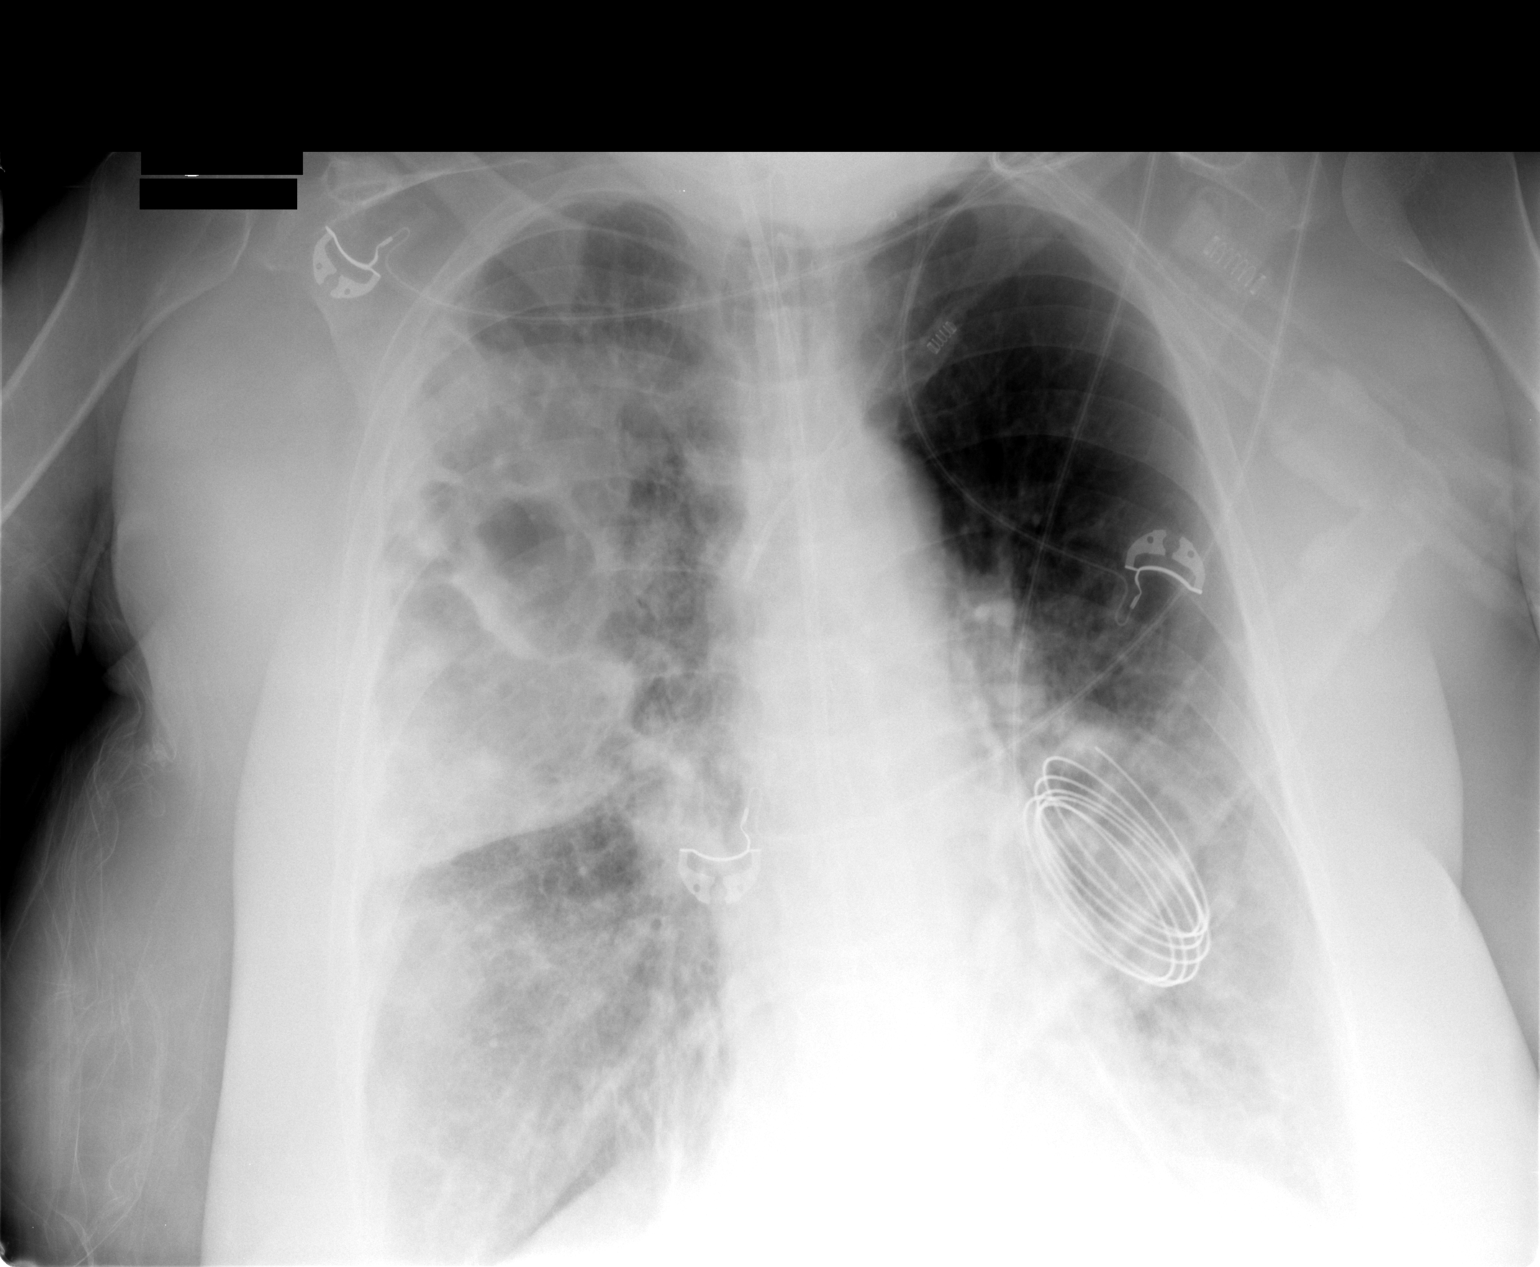

[1 of 1 positions shown; findings below may reference images not displayed]

FINDINGS: The support apparatus is stable.  A very small right
apical pneumothorax is demonstrated.  Persistent necrotizing right
upper lobe pneumonia with bilateral lower lobe infiltrates also.
IMPRESSION: 1.  Stable support apparatus.
2.  Tiny apical pneumothorax on the right.
3.  Persistent necrotizing right upper lobe pneumonia and bibasilar
infiltrates.

## 2010-02-22 IMAGING — CR DG CHEST 1V PORT
1 series · 1 of 1 positions shown · non-contrast
Comparison: 07/04/2008

CLINICAL DATA: Shortness of breath and wheezing

PORTABLE CHEST - 1 VIEW

[view not recorded]
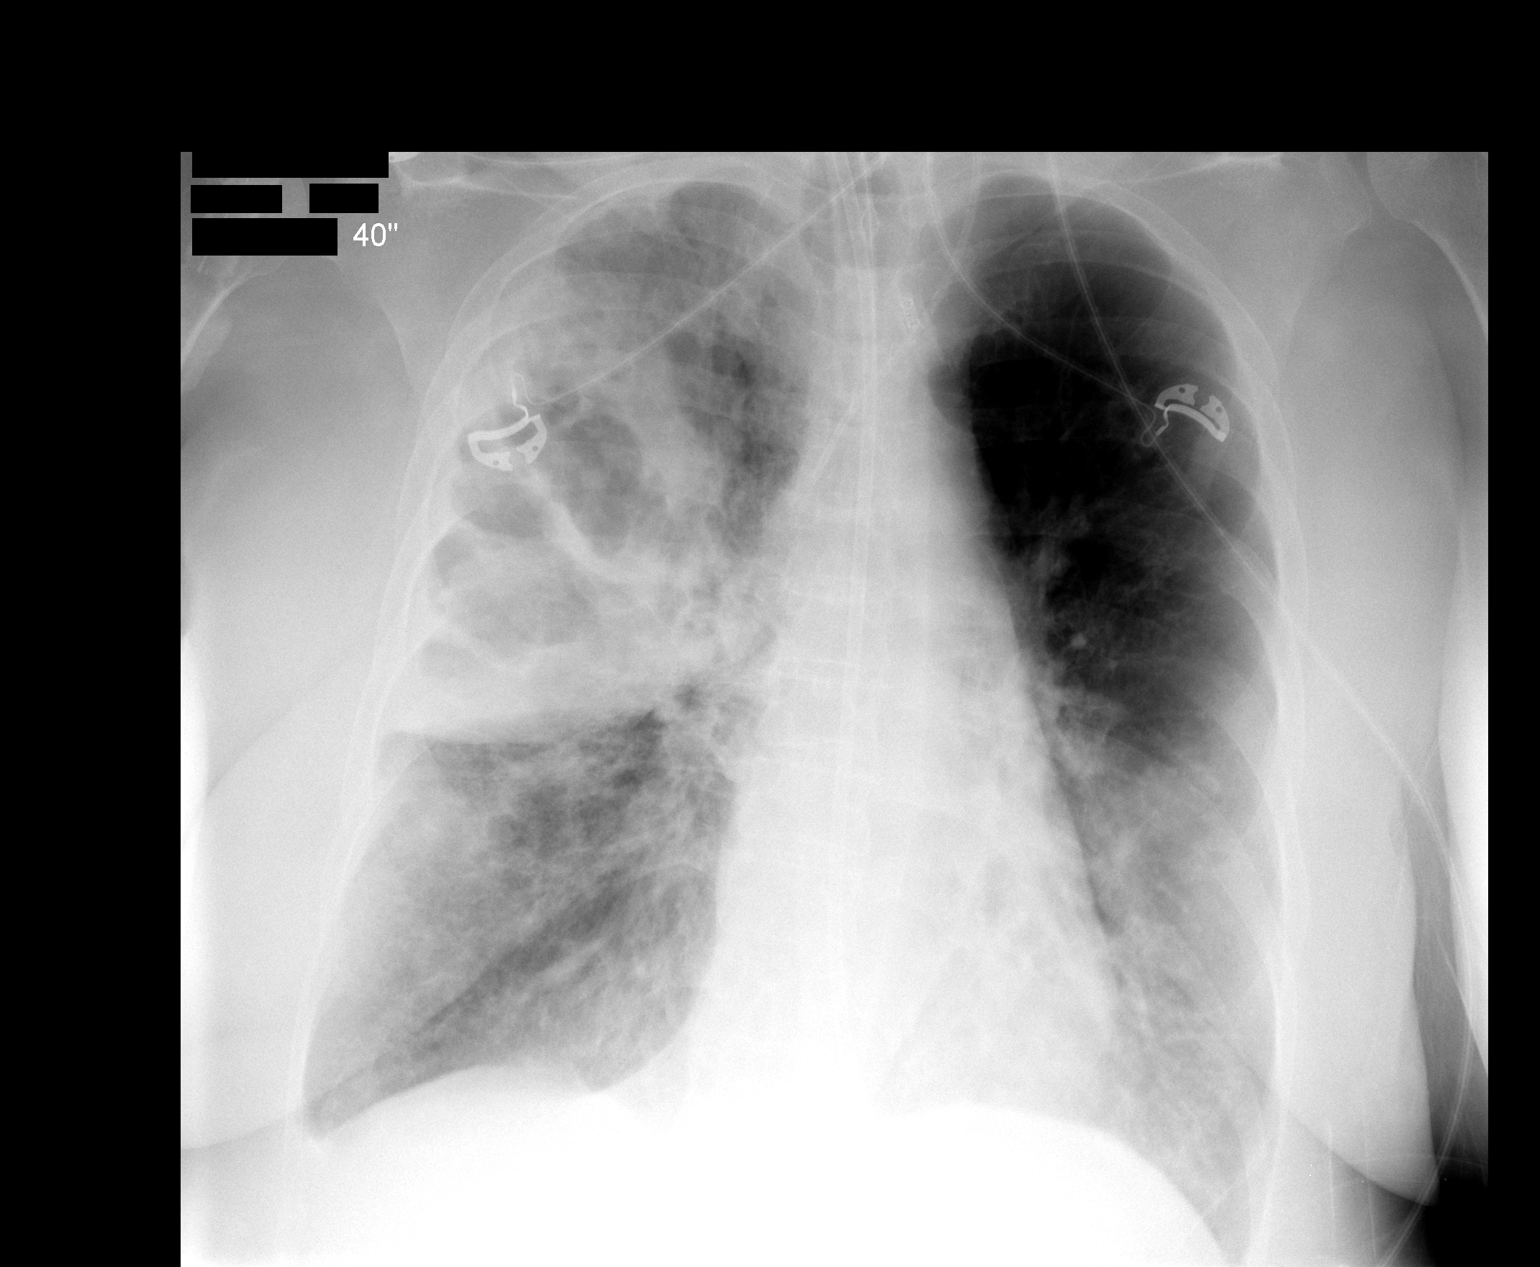

[1 of 1 positions shown; findings below may reference images not displayed]

FINDINGS: There is a left IJ catheter with tip in the projection of the SVC.

Heart size is normal.

There are small effusions and mild edema unchanged from prior exam.

Right upper lobe necrotizing pneumonia is unchanged.

No pneumothorax is identified.
IMPRESSION: 1.  No pneumothorax.
2.  No change in aeration to the right upper lobe.

## 2010-02-23 IMAGING — CR DG ABD PORTABLE 1V
1 series · 1 of 1 positions shown · non-contrast
Comparison: 06/29/2008 and 06/27/2008

CLINICAL DATA: Feeding tube placement

ABDOMEN - 1 VIEW

[view not recorded]
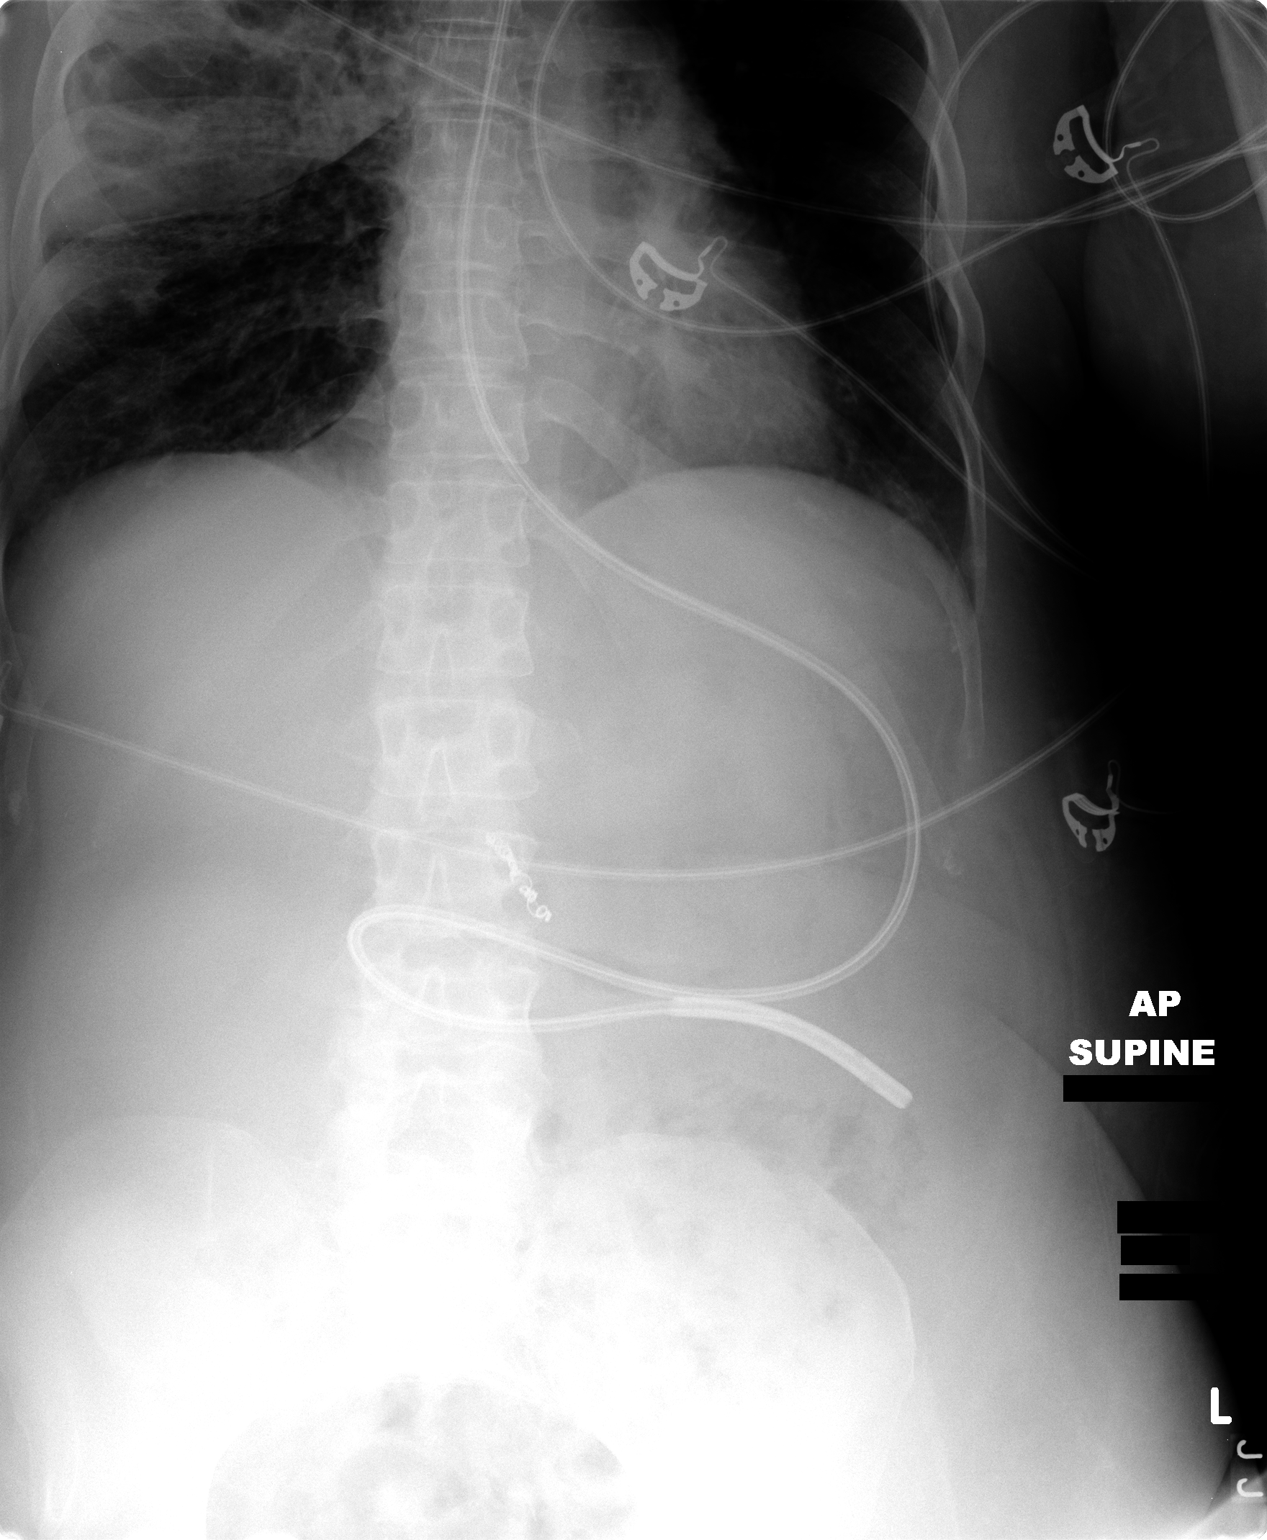

[1 of 1 positions shown; findings below may reference images not displayed]

FINDINGS: A feeding tube is present with its tip positioned
peripherally on the left.  The tip of the tube could be in the
proximal jejunum, but it also could be in a dilated stomach.  I
would recommend injecting about 10 ml of barium, and repeating this
x-ray.  To confirm the location of the tip of the tube.
IMPRESSION: The tip of the feeding tube is either in the proximal jejunum, or
still in the stomach.  Recommend repeating this x-ray after
injecting about 10 ml of barium into the tube.  See report.

REF:W2 DICTATED: 07/05/2008 [DATE]

## 2010-02-23 IMAGING — CR DG CHEST 1V PORT
1 series · 1 of 1 positions shown · non-contrast
Comparison: [DATE]/0919 9319 hours

CLINICAL DATA: Postop.

PORTABLE CHEST - 1 VIEW

[view not recorded]
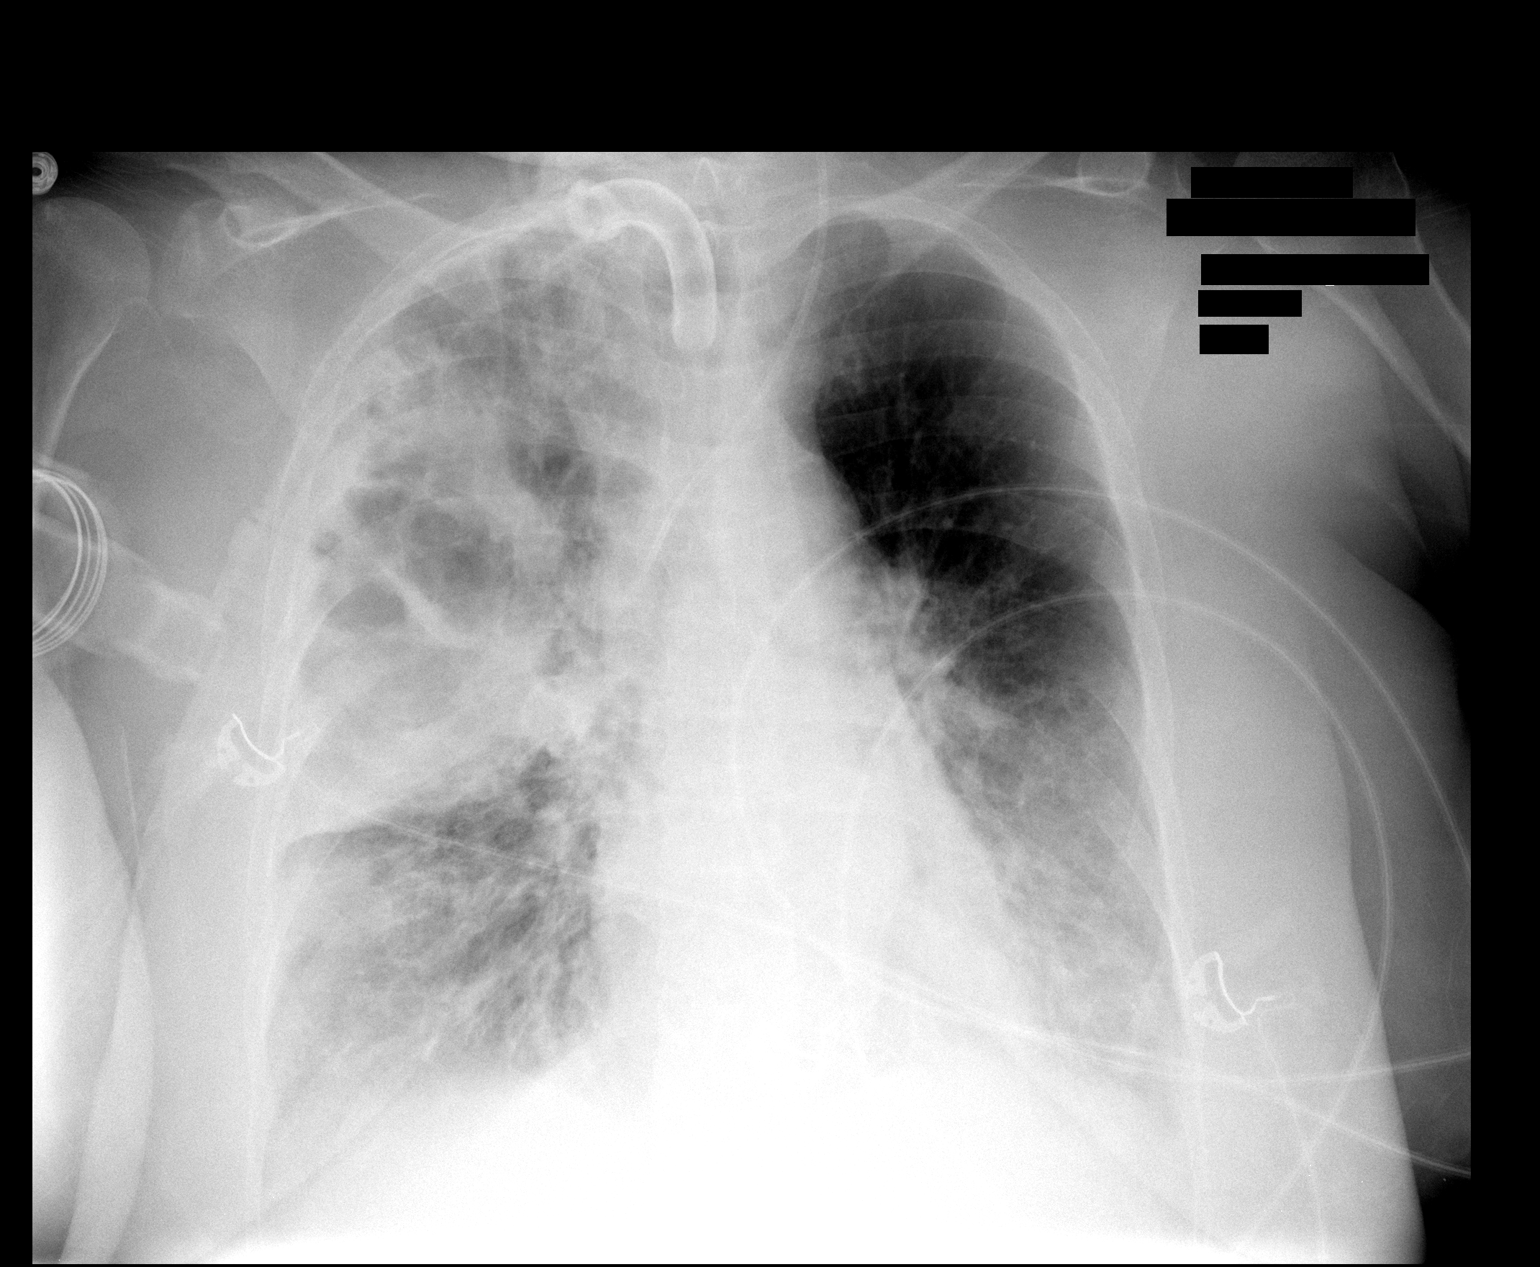

[1 of 1 positions shown; findings below may reference images not displayed]

FINDINGS: The patient has undergone placement of a tracheostomy.
The tracheostomy is in satisfactory position.  The catheter is in
the superior vena cava.  Consolidation within the right upper lung
is redemonstrated.  Patchy parenchymal opacities in the lower lobes
bilaterally with basilar atelectasis is redemonstrated.  The heart
remains normal in size.
IMPRESSION: Tracheostomy in satisfactory position.  Otherwise, there is no
significant interval change.

## 2010-02-23 IMAGING — CR DG CHEST 1V PORT
1 series · 1 of 1 positions shown · non-contrast
Comparison: 07/04/2008

CLINICAL DATA: Respiratory distress.  Pneumonia.

PORTABLE CHEST - 1 VIEW

[view not recorded]
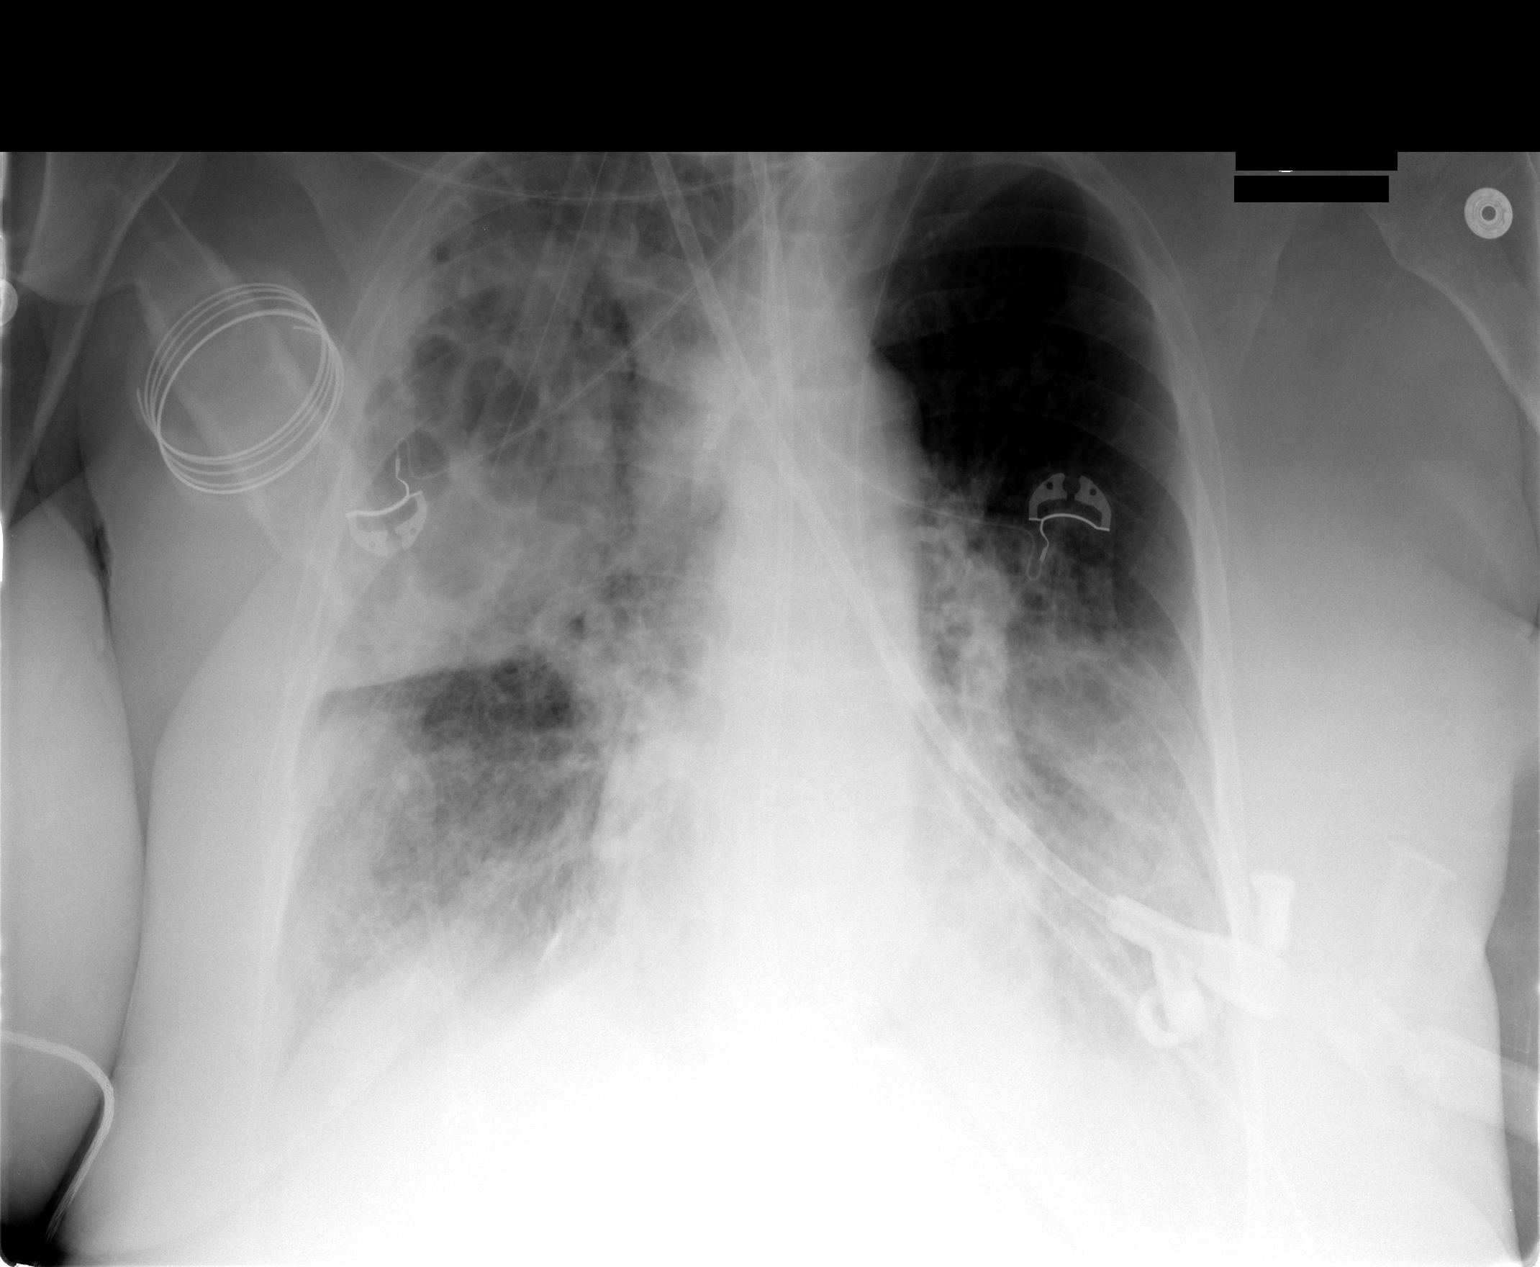

[1 of 1 positions shown; findings below may reference images not displayed]

FINDINGS: The support apparatus is stable.  No significant change
in bilateral infiltrates with necrotizing/cavitary right upper lobe
pneumonia.
IMPRESSION: 1.  Stable support apparatus.
2.  Persistent infiltrates.

## 2010-02-24 IMAGING — CR DG CHEST 1V PORT
1 series · 1 of 1 positions shown · non-contrast
Comparison: 07/05/2008

CLINICAL DATA: Pneumonia

PORTABLE CHEST - 1 VIEW

[view not recorded]
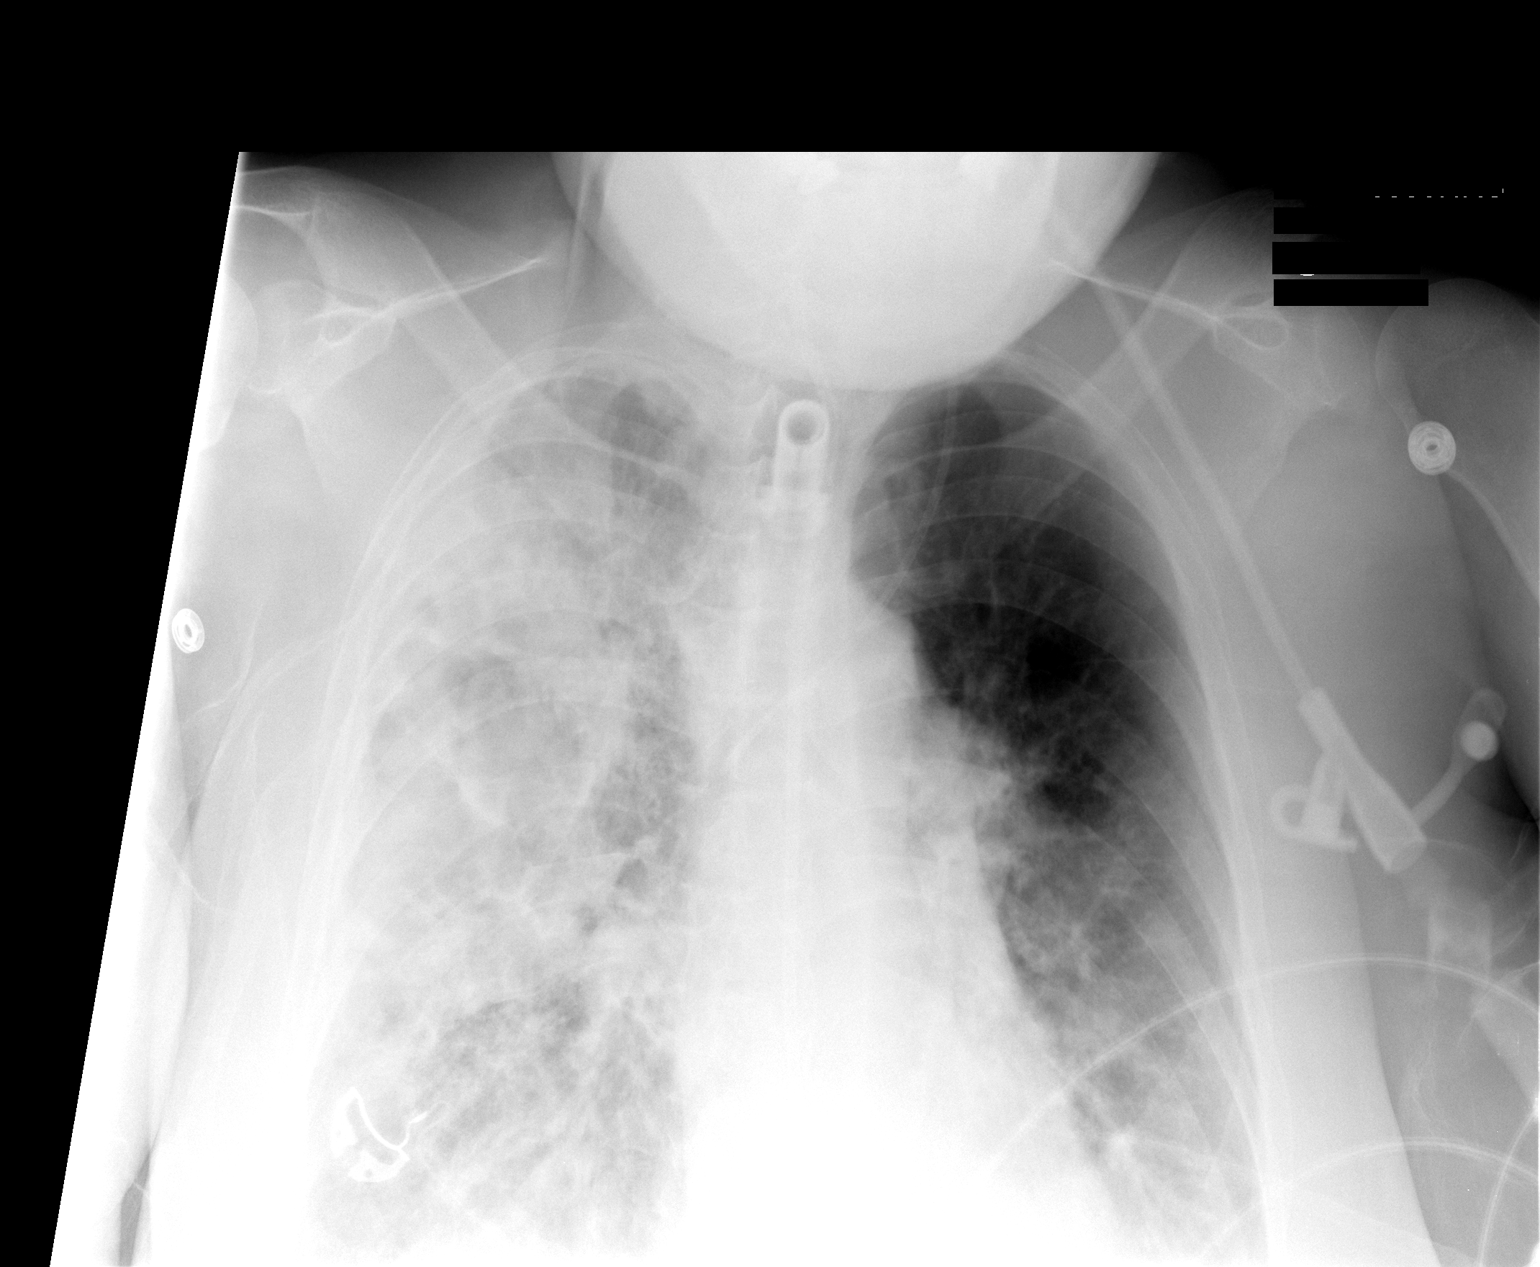

[1 of 1 positions shown; findings below may reference images not displayed]

FINDINGS: The tracheostomy tube and left IJ catheter are stable. A
Panda tube has been placed.  No change in diffuse right lung
infiltrate with cavitation in the right upper lobe.  Persistent
left lower lobe infiltrate also.
IMPRESSION: Persistent bilateral infiltrates.

## 2010-02-26 IMAGING — CR DG CHEST 1V PORT
1 series · 1 of 1 positions shown · non-contrast
Comparison: Same date at 7277 hours.

CLINICAL DATA: Dyspnea.

PORTABLE CHEST - 1 VIEW

[view not recorded]
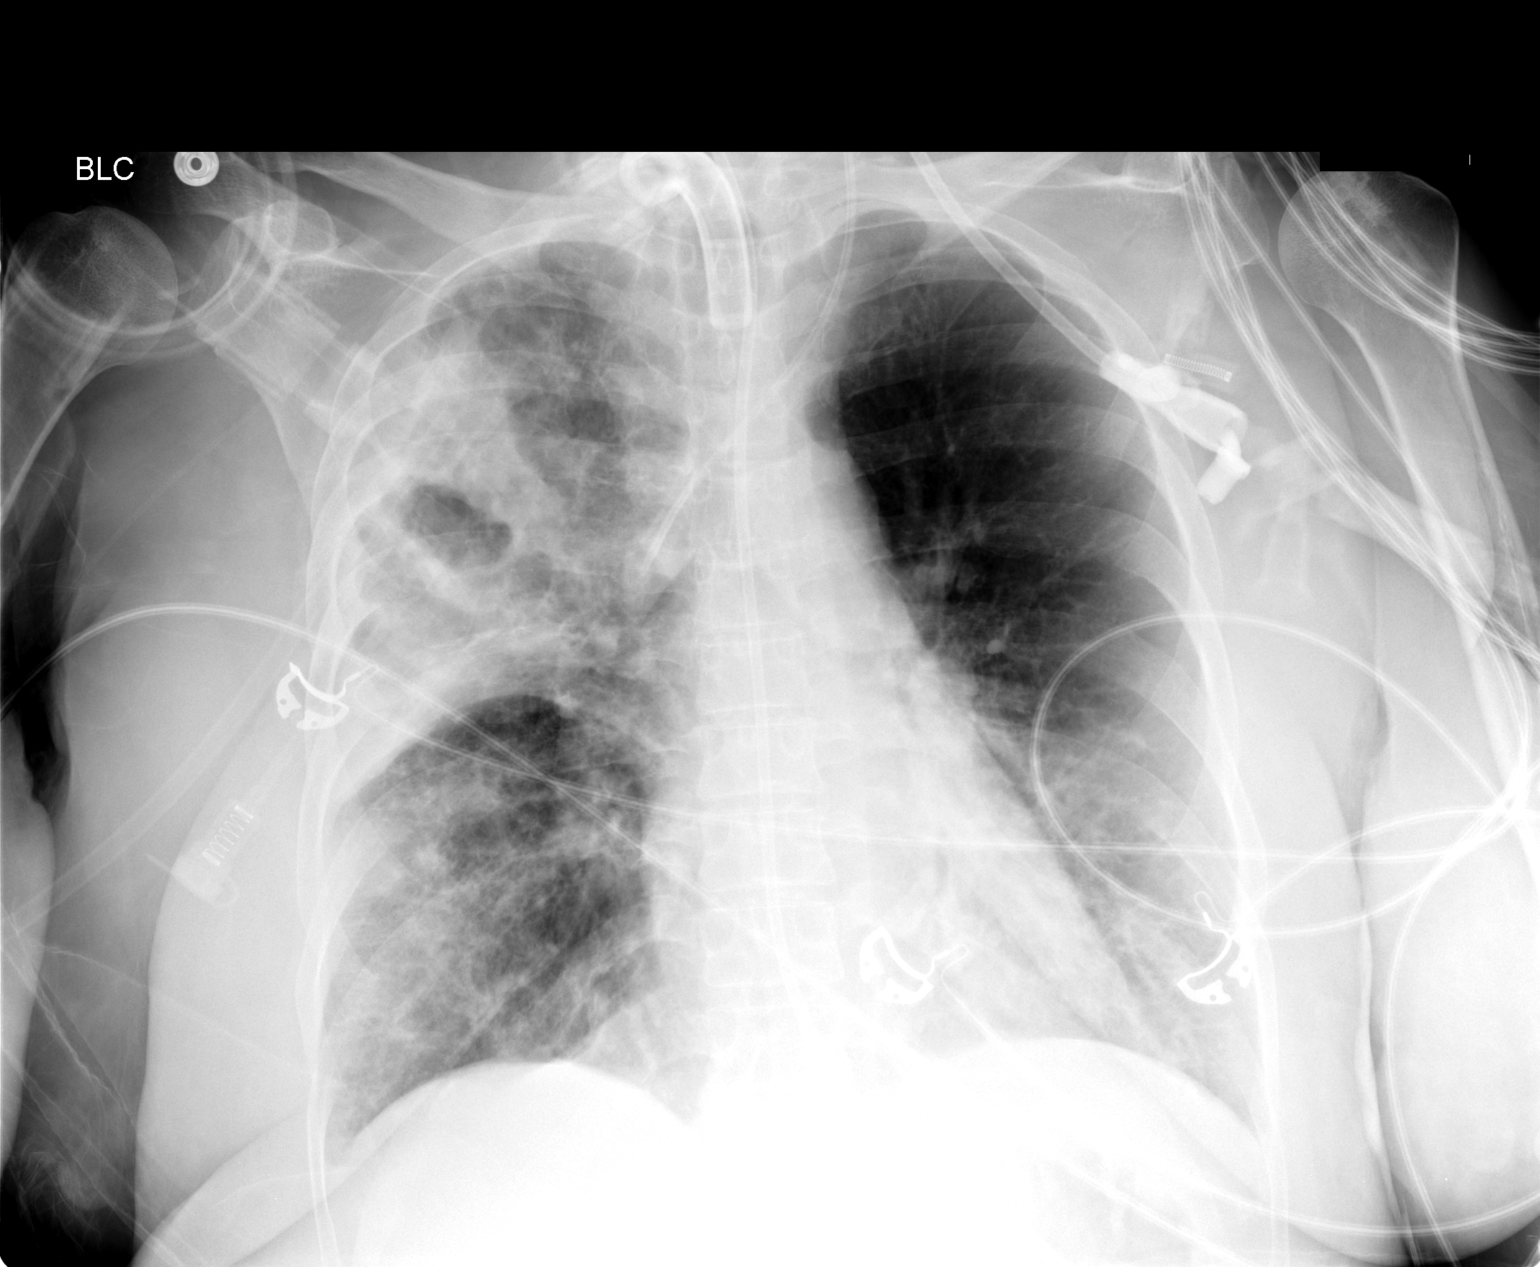

[1 of 1 positions shown; findings below may reference images not displayed]

FINDINGS: Right upper lobe airspace disease with cavitation is
stable.  A tracheostomy tube, left IJ line, and feeding tube are
stable.  Mild interstitial or airspace disease of the right lower
lobe and right middle lobe is stable.  The left lung is clear.
IMPRESSION: 1.  No significant interval change.
2.  Persistent right upper lobe airspace disease with cavitation.
3.  Support apparatus stable.

## 2010-03-01 IMAGING — CR DG CHEST 1V PORT
1 series · 1 of 1 positions shown · non-contrast
Comparison: July 08, 2008

CLINICAL DATA: Line placement; shortness of breath and wheezing

PORTABLE CHEST - 1 VIEW

[view not recorded]
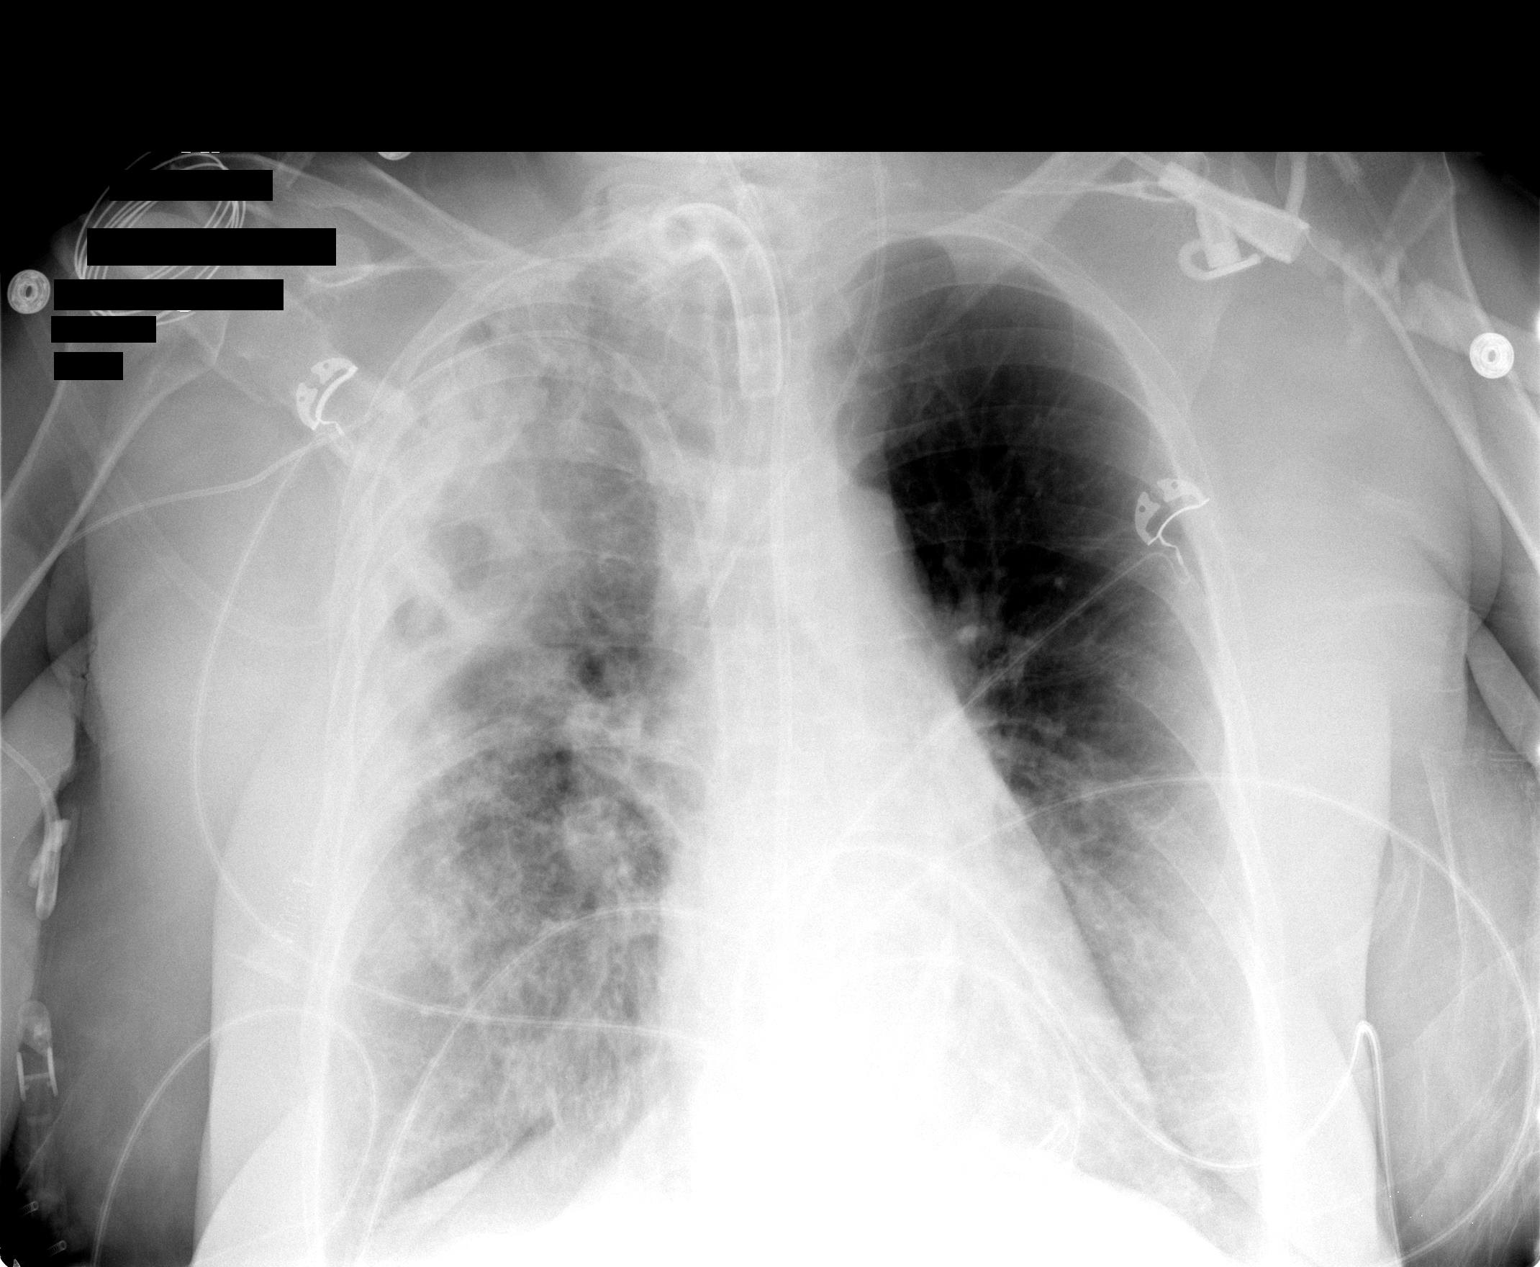

[1 of 1 positions shown; findings below may reference images not displayed]

FINDINGS: A new right upper extremity PICC line tip is in place
with the tip at the cavoatrial junction.  There is no evidence of
pneumothorax.  The tracheostomy tube, left IJ central line, and
feeding tube do not appear changed.  The right upper lobe opacity
with central cavitation appears unchanged.
IMPRESSION: New right upper extremity PICC line tip at cavoatrial junction.  No
pneumothorax.

## 2010-03-03 IMAGING — CR DG CHEST 1V PORT
1 series · 1 of 1 positions shown · non-contrast
Comparison: 07/11/2008.

CLINICAL DATA: Shortness of breath and wheezing.  Pneumonia.

PORTABLE CHEST - 1 VIEW

[view not recorded]
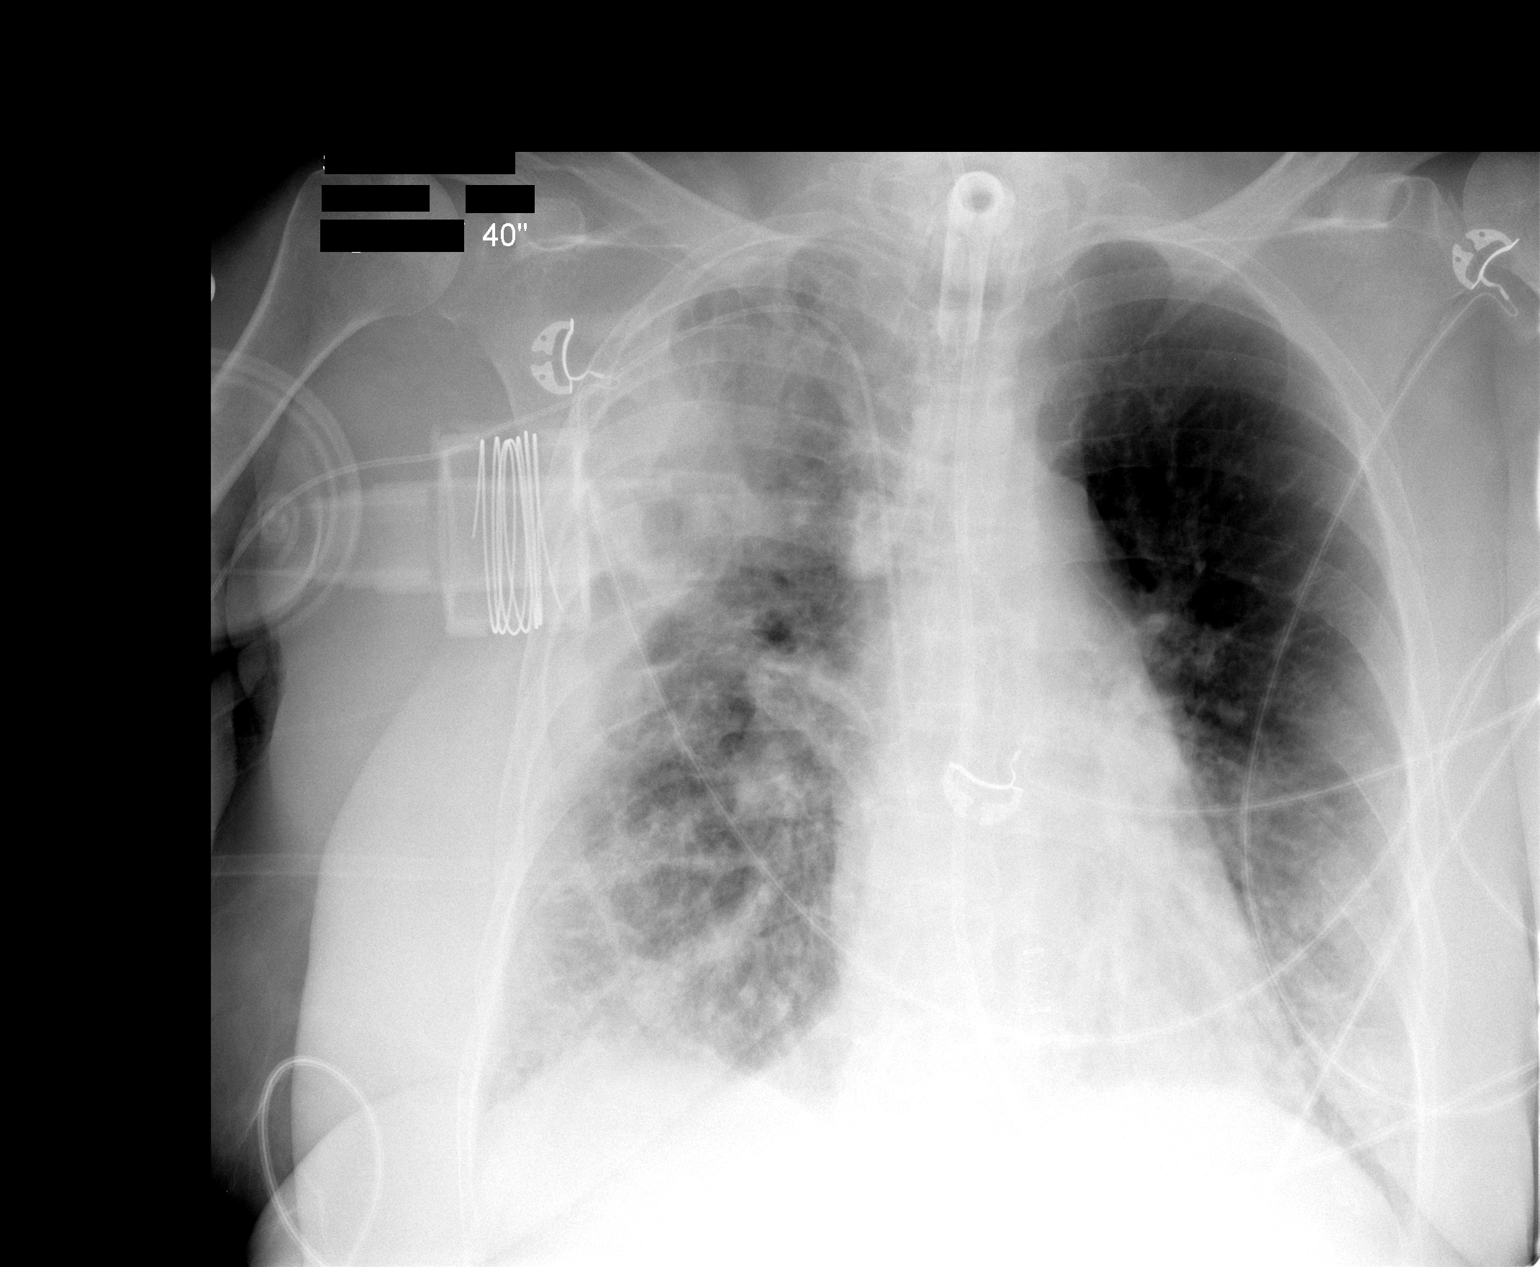

[1 of 1 positions shown; findings below may reference images not displayed]

FINDINGS: Tracheostomy, feeding tube and PICC line appear stable in
position.  Peripheral right upper lobe air space disease with
probable cavitation and adjacent loculated pleural fluid are
unchanged.  There is stable patchy opacity at the right lung base.
The left lung is clear.  There is no pneumothorax.  The heart size
and mediastinal contours appear stable.
IMPRESSION: 1.  Stable support system and lines.
2.  Stable cavitary right upper lobe air space disease.

## 2010-03-03 IMAGING — CT CT ABDOMEN W/ CM
2 of 5 series · 12 of 32 positions shown, 17 images · IV contrast (agent unspecified)
Comparison: Chest CT from 07/02/2008.  Abdomen and pelvis CT from
11/25/2007.

CT CHEST

CLINICAL DATA: Shortness of breath and wheezing.  Pneumonia.
Crohn's disease.  Respiratory failure.  Sepsis.

CT CHEST WITHOUT CONTRAST
CT ABDOMEN AND PELVIS WITH CONTRAST
TECHNIQUE: Multidetector CT imaging of the chest was performed
following the standard protocol without IV contrast. Multidetector
CT imaging of the abdomen and pelvis was performed using the
standard protocol during bolus injection of intravenous contrast
material.
Contrast:  100 ml Qmnipaque-SOO.

[Series 2: chest_routine 5.0 b40f st · axial · 0.72mm/px · z∈[-190,-50]mm · 4 of 66 slices shown]
[im 10/66  soft-tissue]
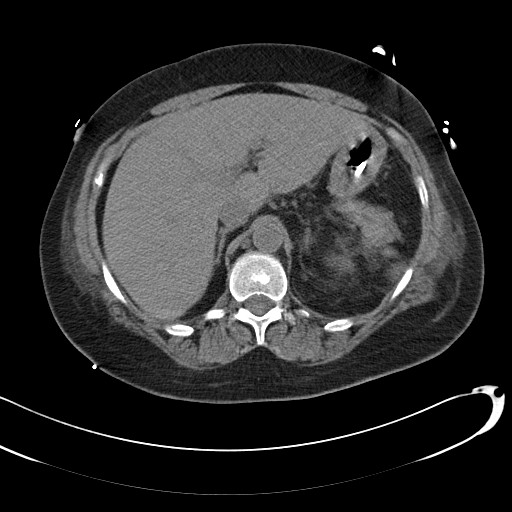
[im 19/66  soft-tissue]
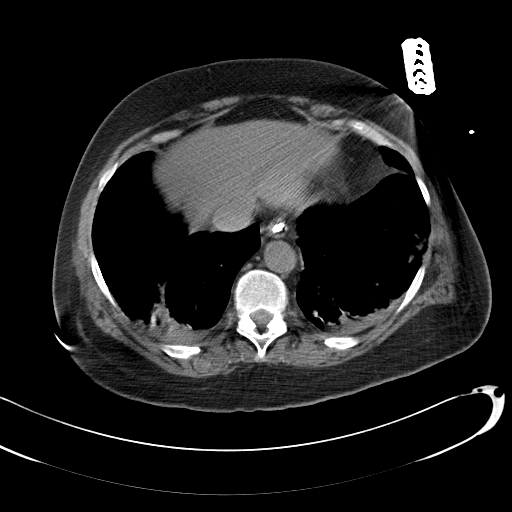
[im 28/66  soft-tissue]
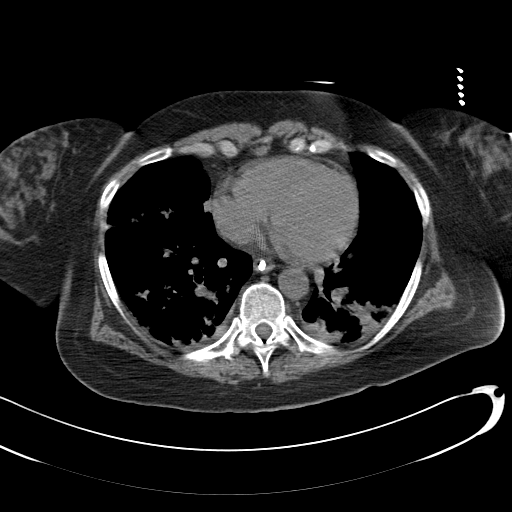
[im 38/66  soft-tissue]
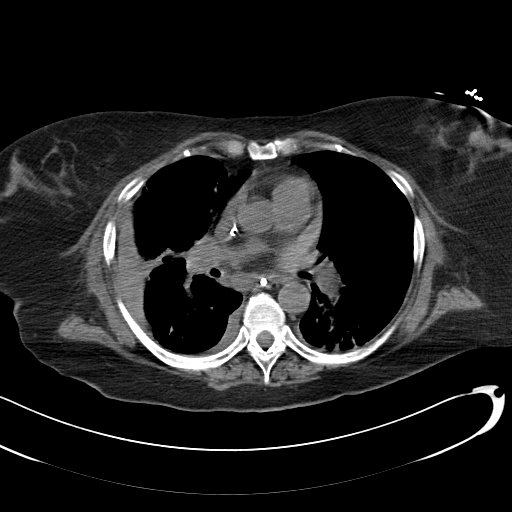

[Series 5: abd_pel 5.0 b40f st · axial · 0.72mm/px · z∈[-456,-140]mm · 8 of 81 slices shown, 13 images]
[im 9/81  soft-tissue]
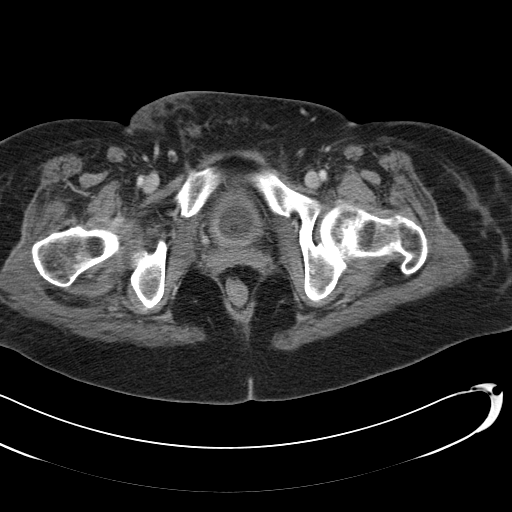
[im 9/81  bone]
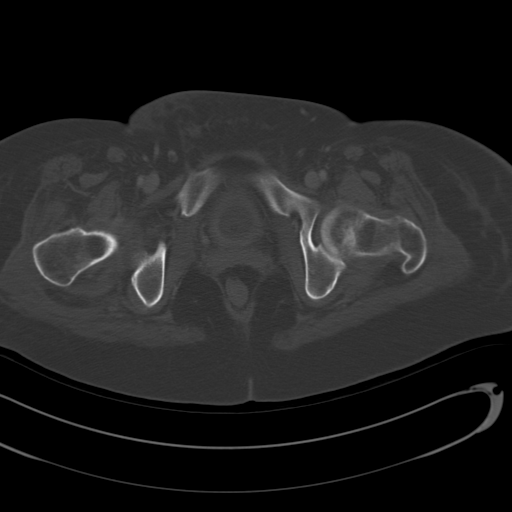
[im 18/81  soft-tissue]
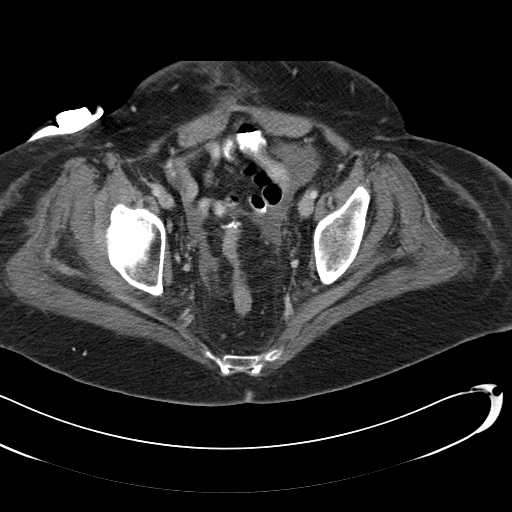
[im 27/81  soft-tissue]
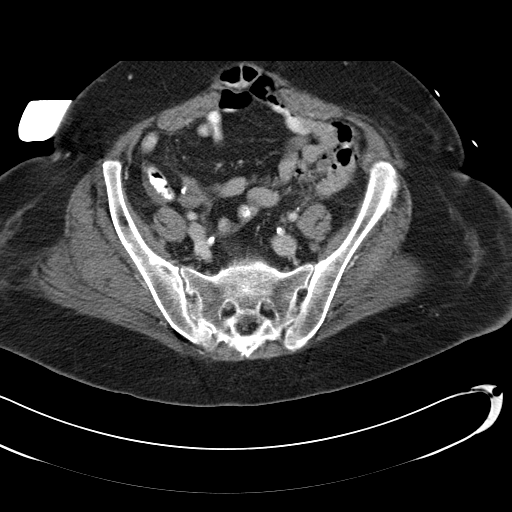
[im 36/81  soft-tissue]
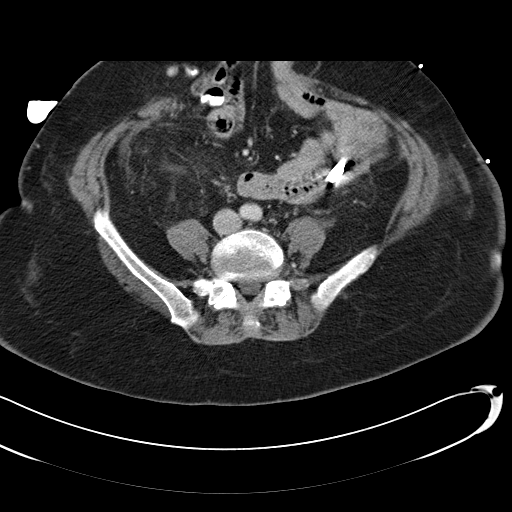
[im 45/81  soft-tissue]
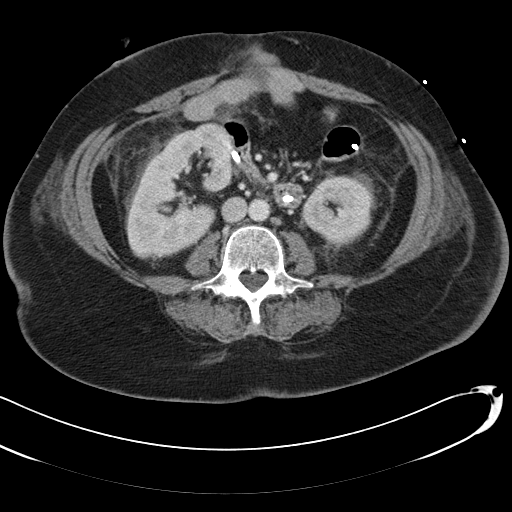
[im 45/81  lung]
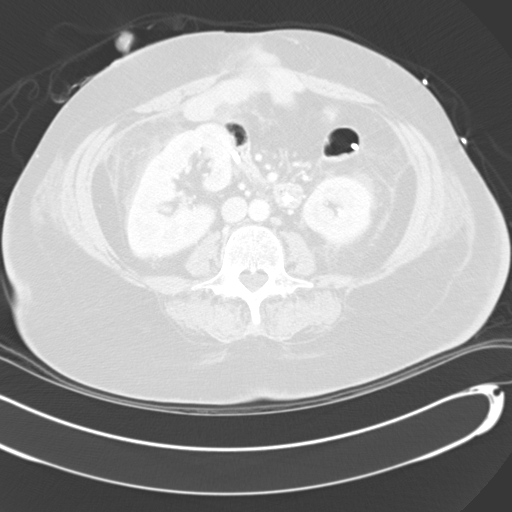
[im 54/81  soft-tissue]
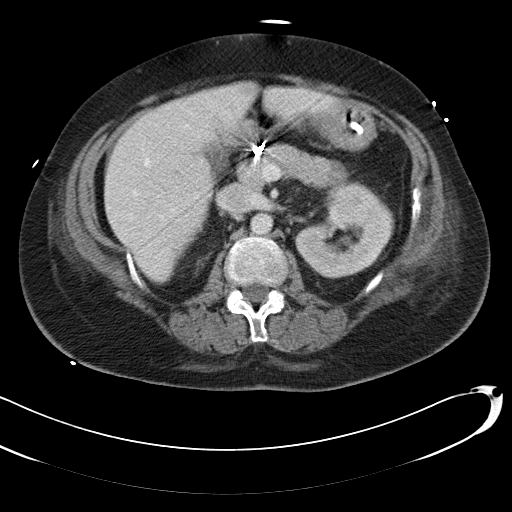
[im 54/81  lung]
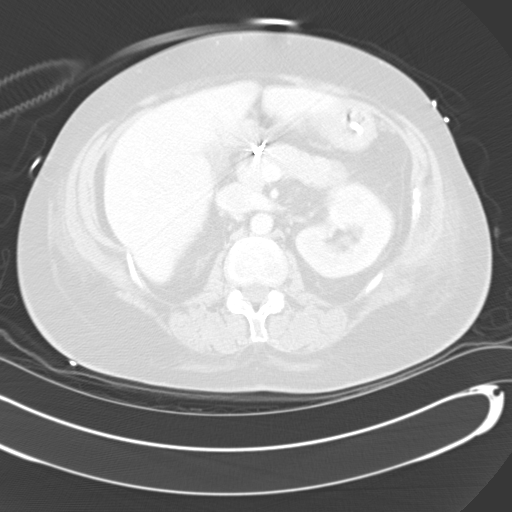
[im 63/81  soft-tissue]
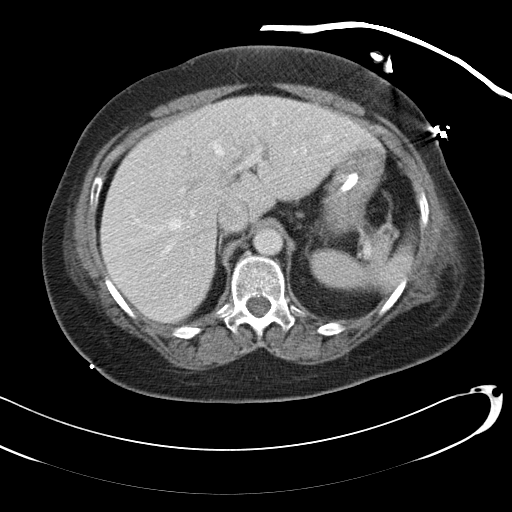
[im 63/81  lung]
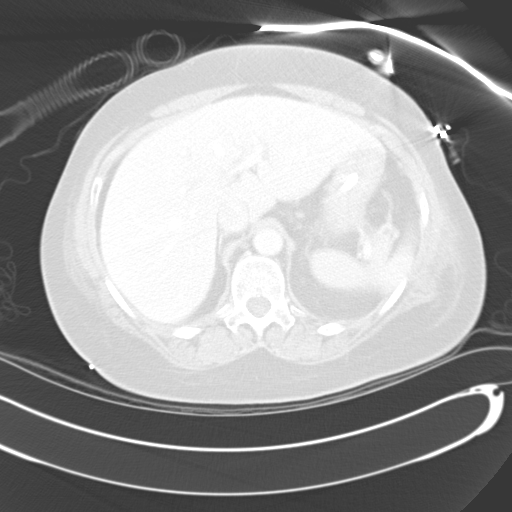
[im 72/81  soft-tissue]
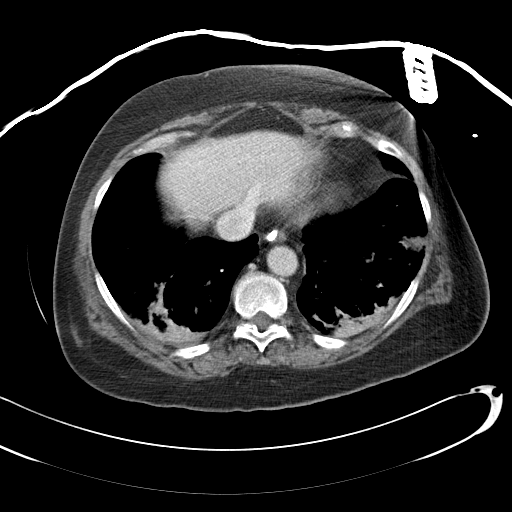
[im 72/81  lung]
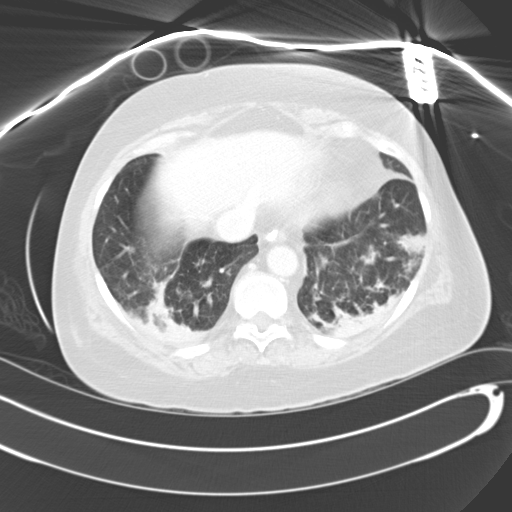

[12 of 32 positions shown; findings below may reference images not displayed]

FINDINGS: Tracheostomy tube noted.  There is no axillary
lymphadenopathy.  Right PICC line tip projects at the junction of
the SVC and RA.  Feeding tube noted in the esophagus. Mild
mediastinal lymphadenopathy noted with the 10 mm short-axis right
paratracheal lymph node and a 10 mm short axis lymph node
identified in the AP window.  The hilar regions are difficult to
assess without IV contrast.  Heart size is normal.  There is no
pericardial effusion.  No evidence for pleural effusion.

Lung windows show emphysema.  There is interval decrease in the
right upper lobe confluent airspace disease.  The degree of
cavitation within the consolidated lung has also decreased in the
interval.  Associated interval improvement and bilateral lower lobe
airspace disease is evident.
IMPRESSION: Interval decrease and the right upper lobe cavitary consolidation.
The airspace disease seen in both lower lobes on the previous study
has also improved.

CT ABDOMEN
FINDINGS: No focal abnormalities seen in the liver or spleen.  A
feeding catheter passes into the stomach before exiting into the
duodenum.  The feeding tube tip is in the proximal jejunum.  A
small amount of peri hepatic fluid is identified.  The gallbladder
is decompressed with a small amount of fluid around the gallbladder
in the gallbladder fossa.  The adrenal glands are normal.  Kidneys
show no focal abnormality.

A right lower quadrant ileostomy is noted in this patient status
post subtotal colectomy.  Umbilical hernia contains small bowel
loops without evidence for incarceration or obstruction.

No abdominal aortic aneurysm.  No evidence for lymphadenopathy in
the abdomen.
IMPRESSION: Small amount of peri hepatic fluid with fluid in the gallbladder
fossa, surrounding a decompressed gallbladder.  Acute cholecystitis
is not excluded.  Consider ultrasound to further evaluate.

CT PELVIS
FINDINGS: A small amount of intraperitoneal free fluid is noted.
The bladder is decompressed by a Foley catheter.  Air in the
bladder is compatible with the instrumentation.  Uterus is
surgically absent.  There is no evidence for an adnexal mass.  A
Hartmann's pouch is evident.

There is some mild skin thickening in the lower anterior abdominal
wall, tracking towards the suprapubic region.  A small amount of
underlying subcutaneous edema is evident.  These imaging features
could be compatible with cellulitis.

Bone windows show no worrisome lytic or sclerotic osseous lesion.
IMPRESSION: A small amount of intraperitoneal free fluid is present of
indeterminate etiology.

Skin thickening in the lower anterior abdominal wall, towards the
suprapubic region with underlying subcutaneous edema.  Cellulitis
could have this appearance.

## 2010-03-04 IMAGING — CR DG CHEST 1V PORT
1 series · 1 of 1 positions shown · non-contrast
Comparison: 07/13/2008

CLINICAL DATA: Pneumonia

PORTABLE CHEST - 1 VIEW

[view not recorded]
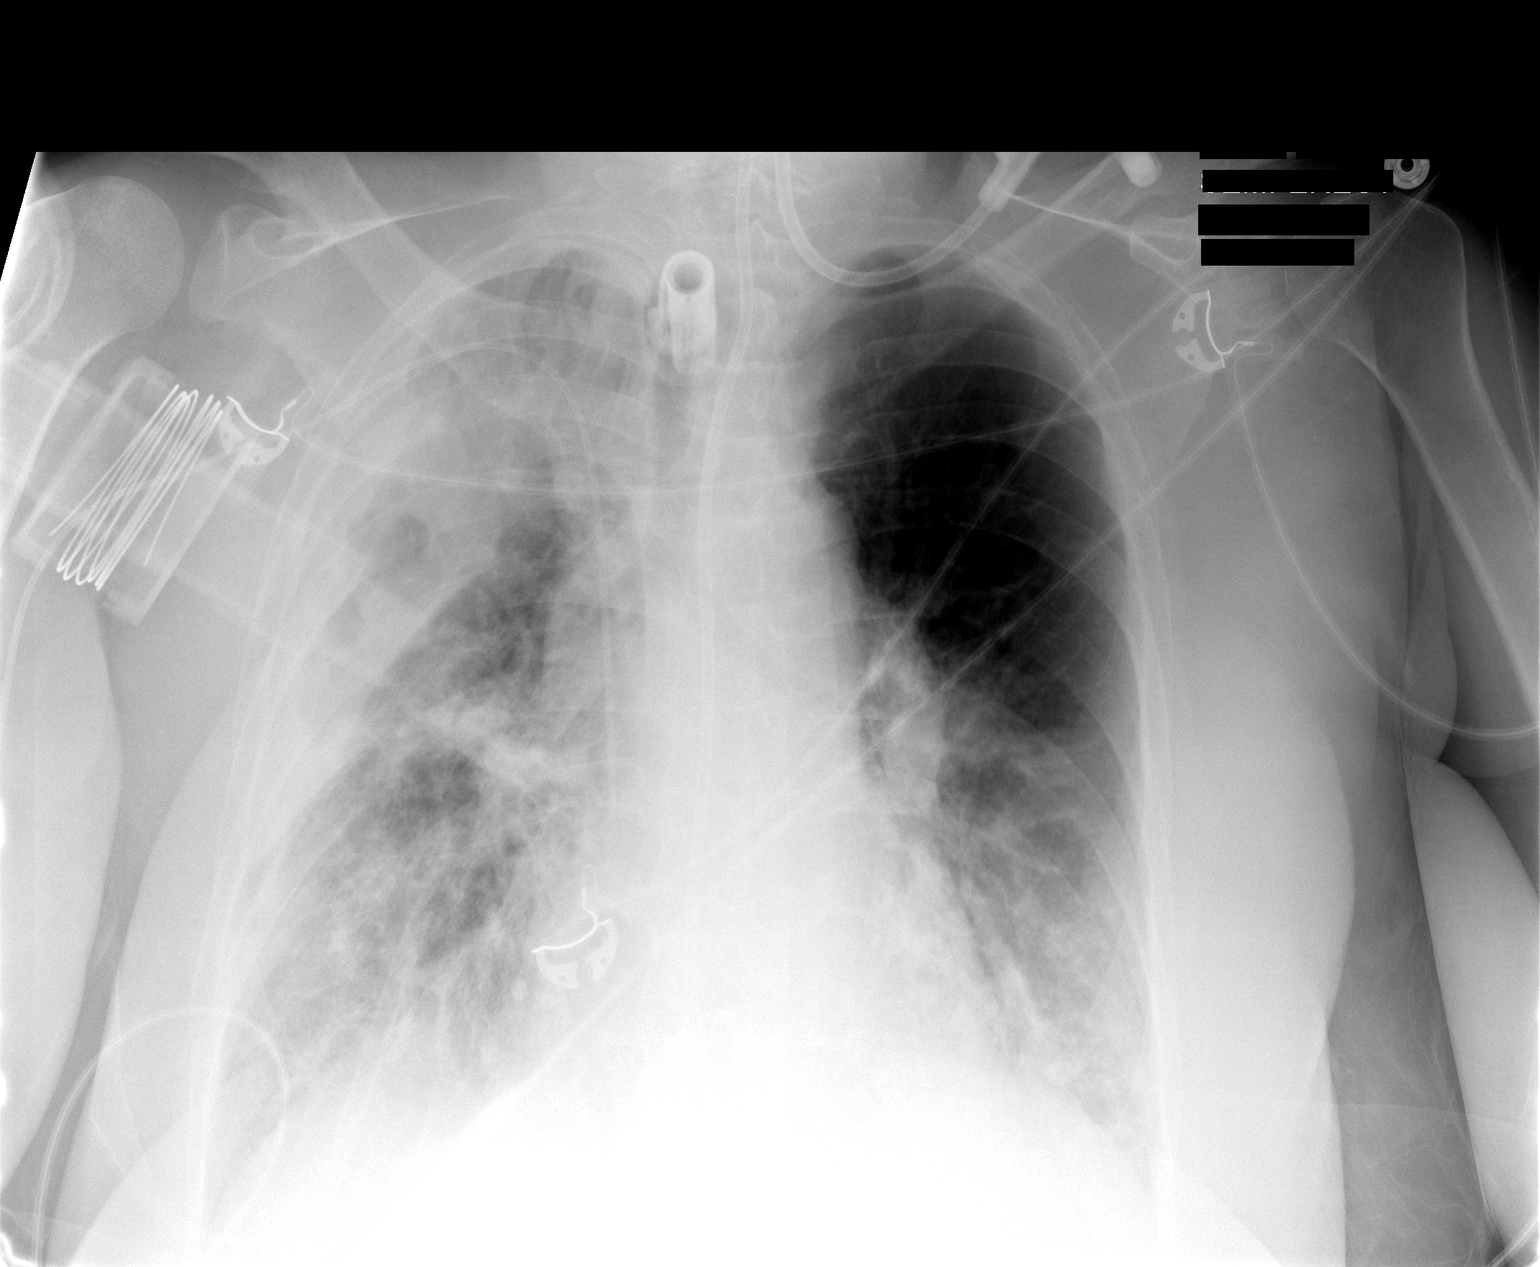

[1 of 1 positions shown; findings below may reference images not displayed]

FINDINGS: Cardiomediastinal silhouette is stable.  Right upper lobe
cavitary consolidation again noted without significant change in
aeration.  No change in the NG tube, tracheostomy tube and right
PICC line position.  Patchy airspace disease bilateral lower lobe
is stable.
IMPRESSION: No significant change.  Stable support apparatus.  Stable cavitary
consolidation right upper lobe.  Stable patchy airspace disease
bilateral lower lobe.

## 2010-03-11 IMAGING — CR DG ABD PORTABLE 1V
1 series · 1 of 1 positions shown · non-contrast
Comparison: Abdomen 07/19/2008.

CLINICAL DATA: Panda placement.

ABDOMEN - 1 VIEW

[view not recorded]
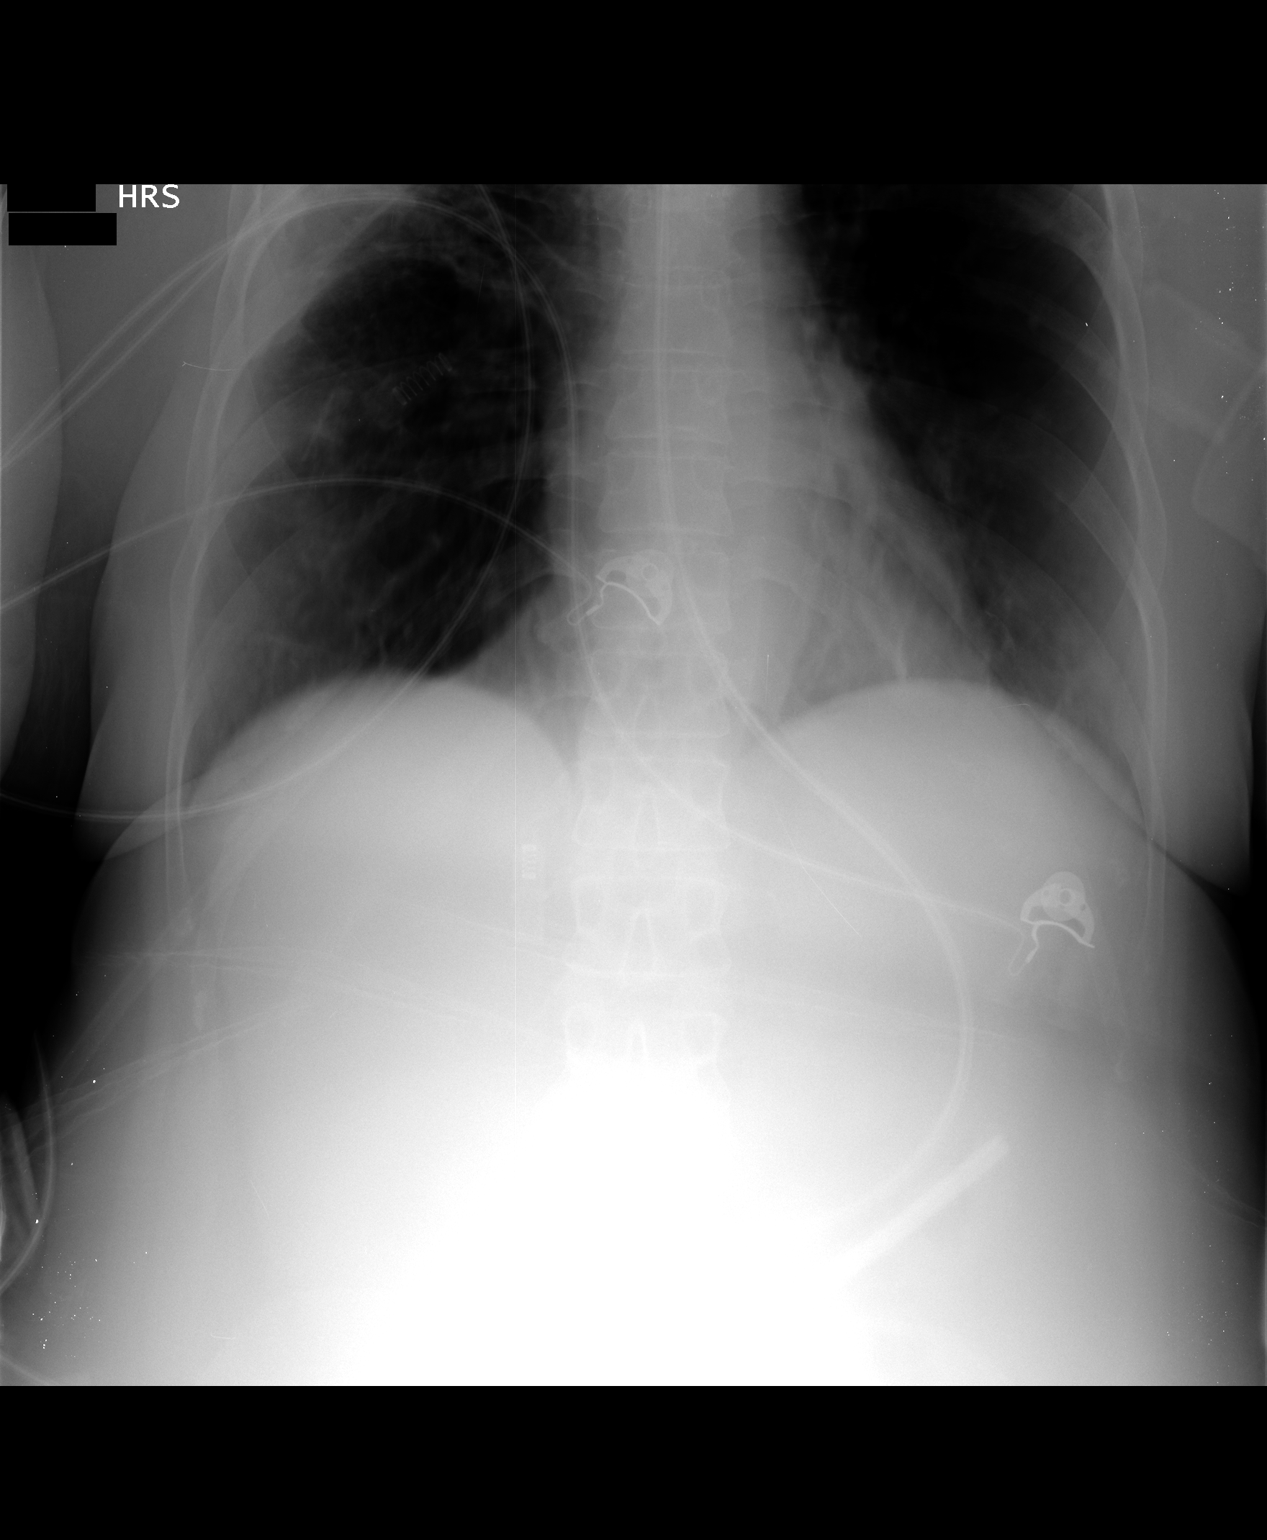

[1 of 1 positions shown; findings below may reference images not displayed]

FINDINGS: Panda tube is looped in the distal stomach with the tip
projecting retrograde. Partial visualization of right upper lobe
airspace disease is not appear grossly changed since chest film
07/19/2008.
IMPRESSION: As above.

## 2010-03-11 IMAGING — CR DG CHEST 1V PORT
1 series · 1 of 1 positions shown · non-contrast
Comparison: 07/14/2008.

CLINICAL DATA: Respiratory failure.

PORTABLE CHEST - 1 VIEW

[view not recorded]
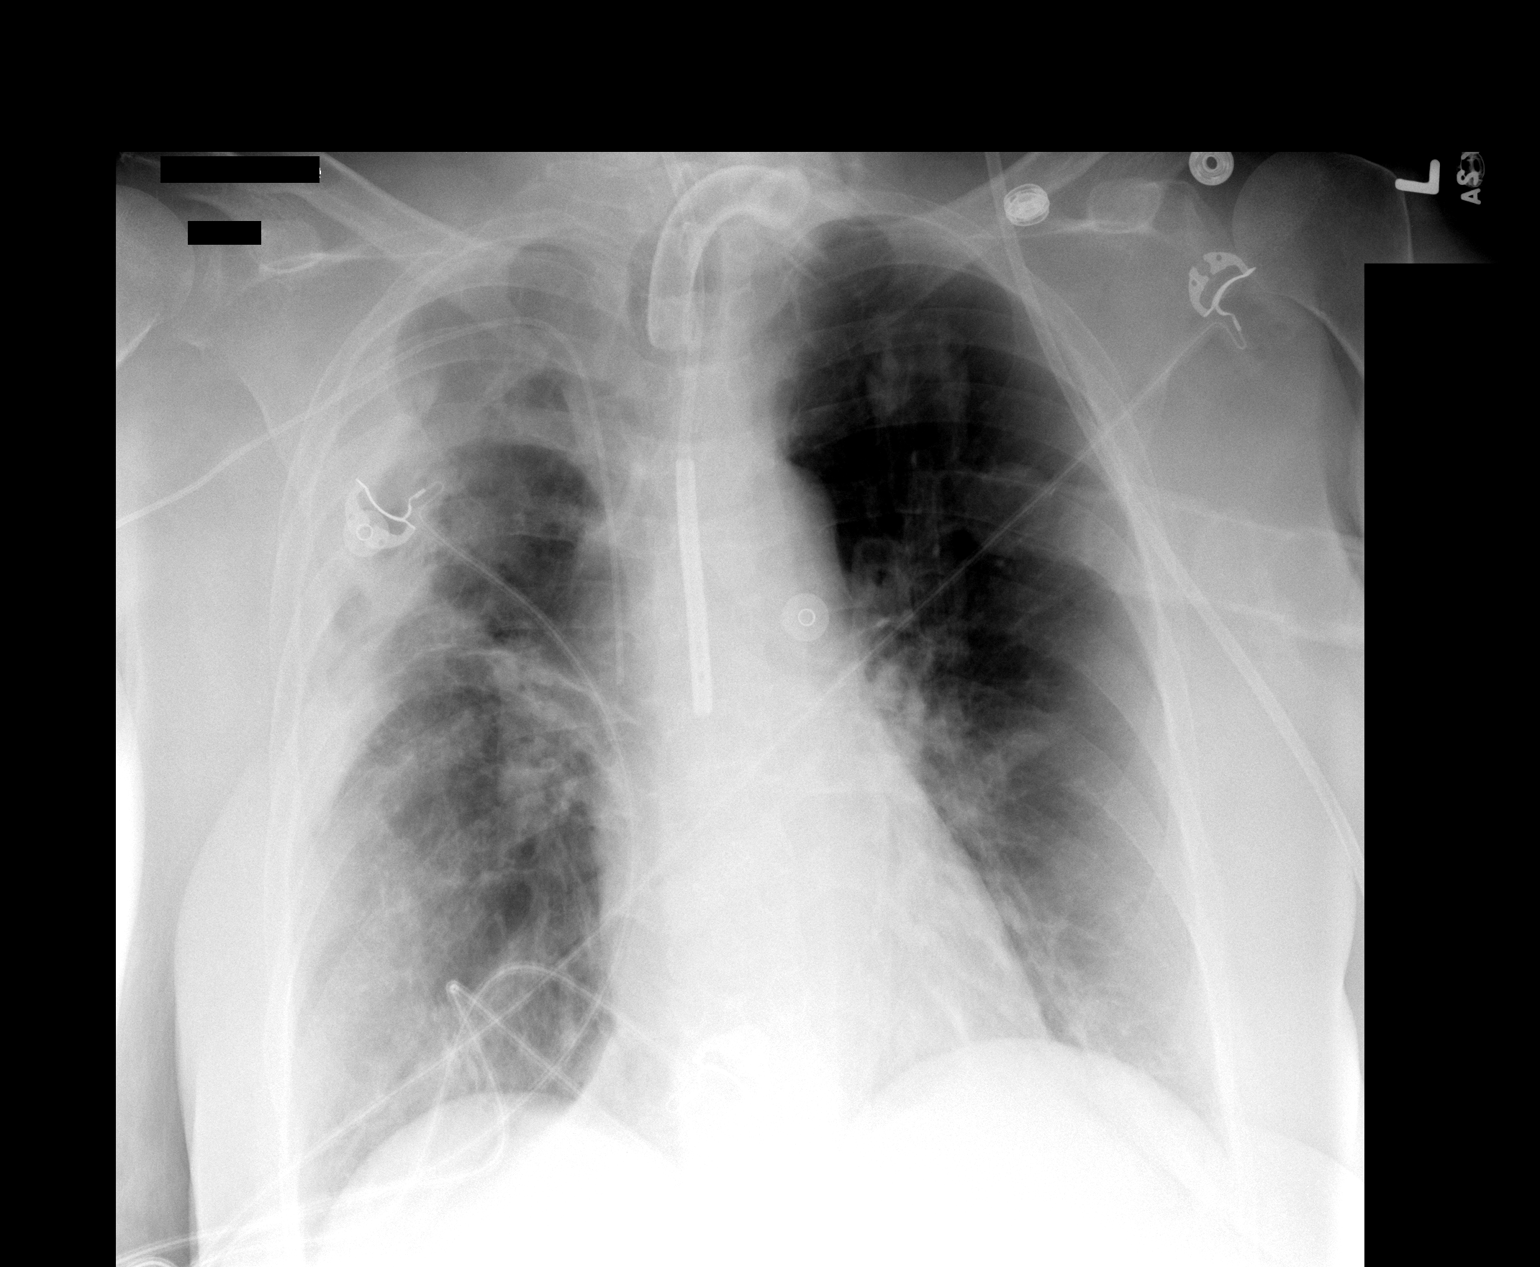

[1 of 1 positions shown; findings below may reference images not displayed]

FINDINGS: Right PICC line tip mid superior vena cava.  Tracheostomy
tube tip midline.  Panda tube has been retracted with the tip at
the level of the mid to upper esophagus.  This needs to be
repositioned.  Call is in to the floor.

Pleural parenchymal changes with cavitation peripheral aspect right
lung appears similar.  Interval improvement in aeration lung bases.
Pulmonary vascular congestion.  Heart size within normal limits.
IMPRESSION: Panda tube has been retracted with the tip at the level mid to
upper esophagus.  This needs to be repositioned.

## 2010-03-18 IMAGING — CR DG ABDOMEN ACUTE W/ 1V CHEST
3 series · 3 of 3 positions shown · non-contrast
Comparison: Chest x-ray 07/21/2008 and abdomen 07/21/2008

CLINICAL DATA: Abdominal pain and respiratory failure.

ACUTE ABDOMEN SERIES (ABDOMEN 2 VIEW & CHEST 1 VIEW)

[AP (1 of 2)]
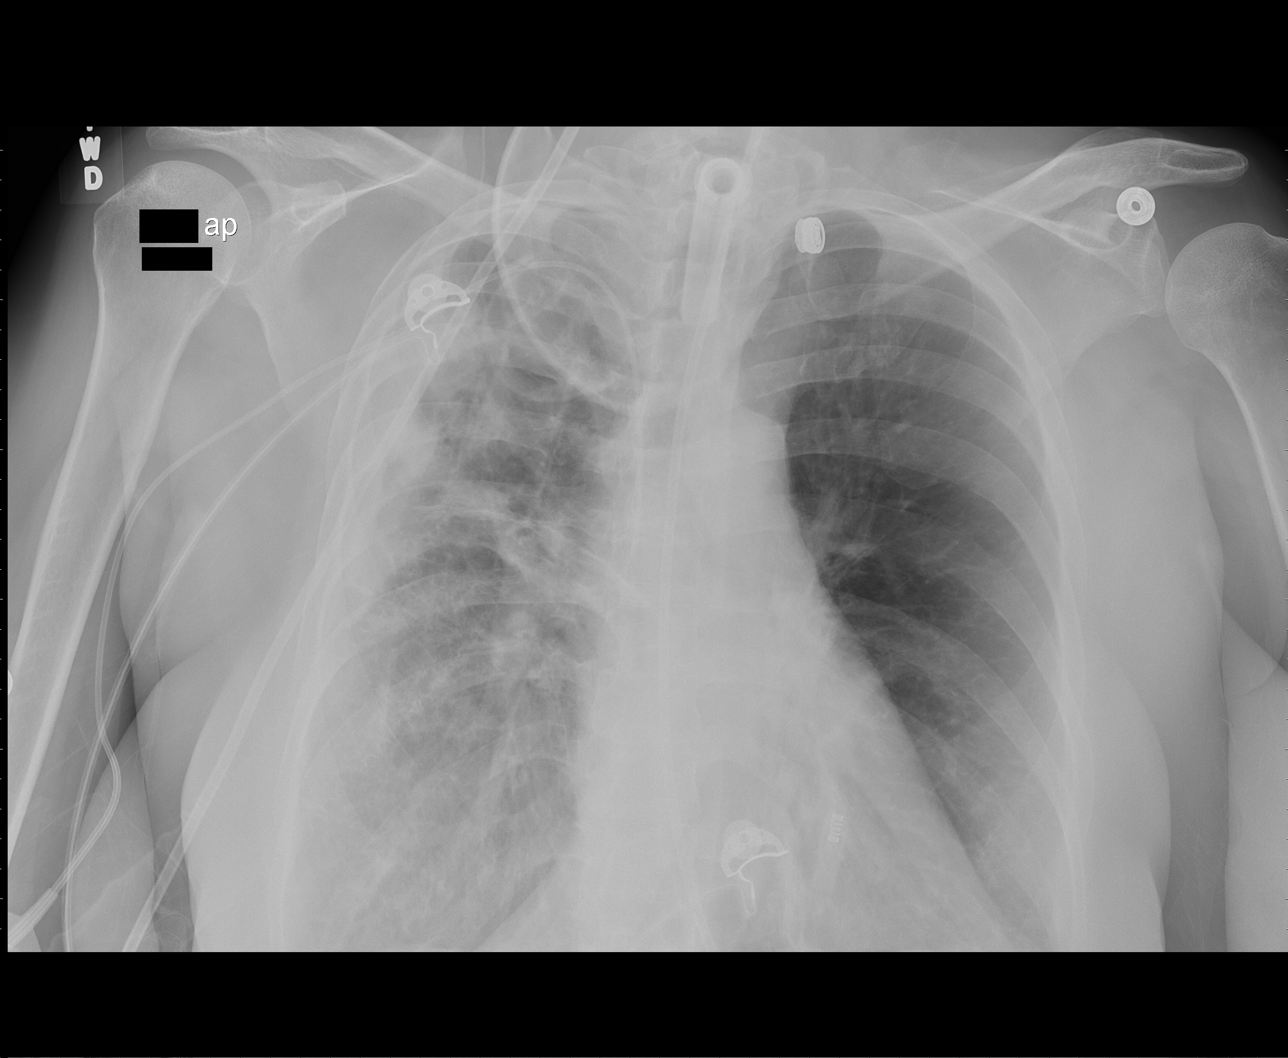

[AP (2 of 2)]
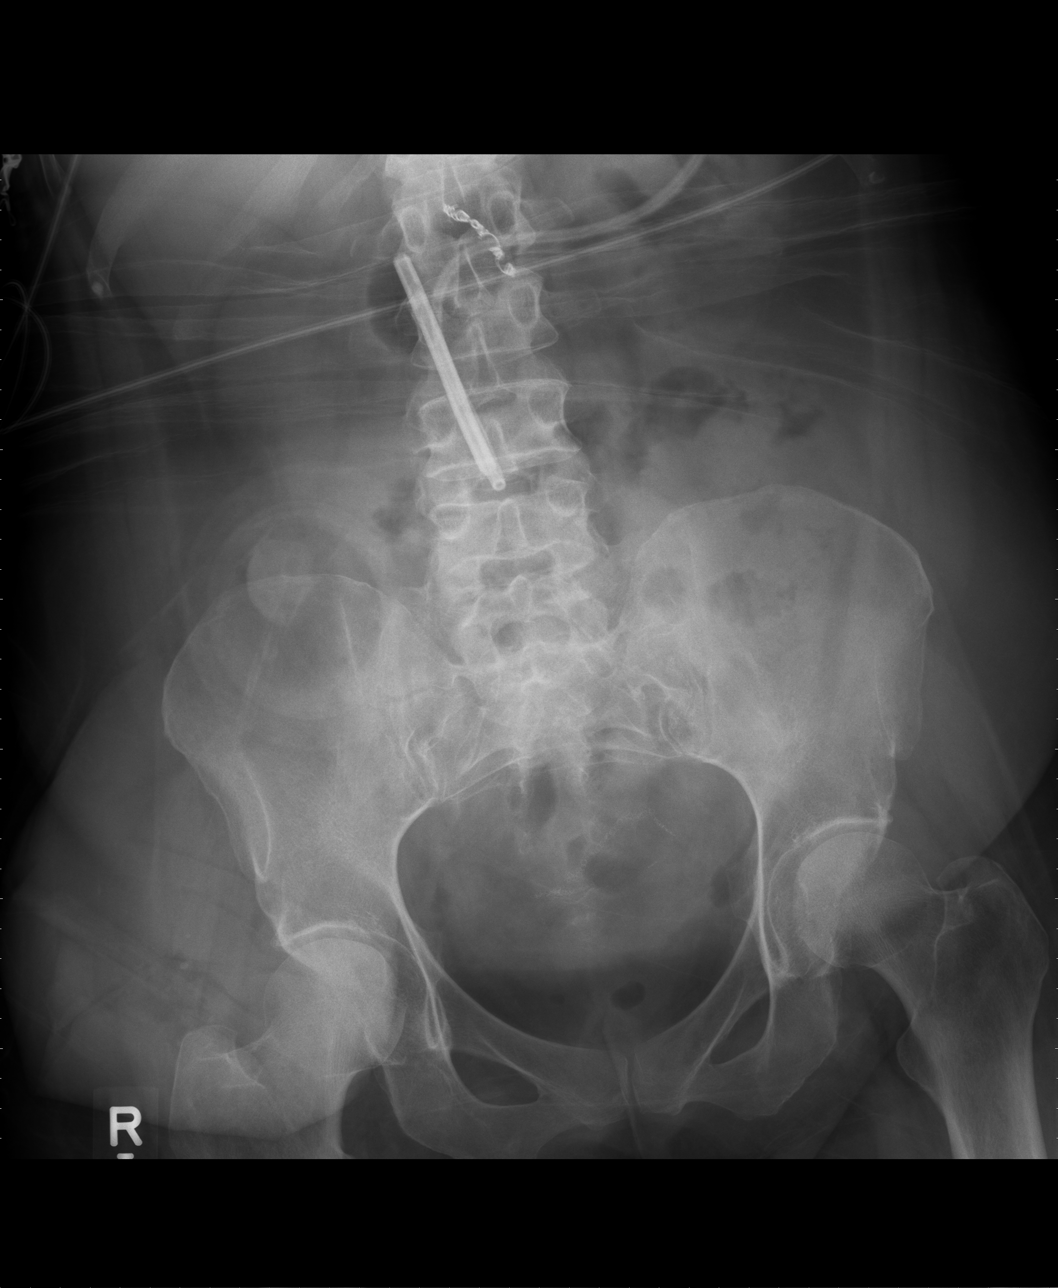

[lat decub abd]
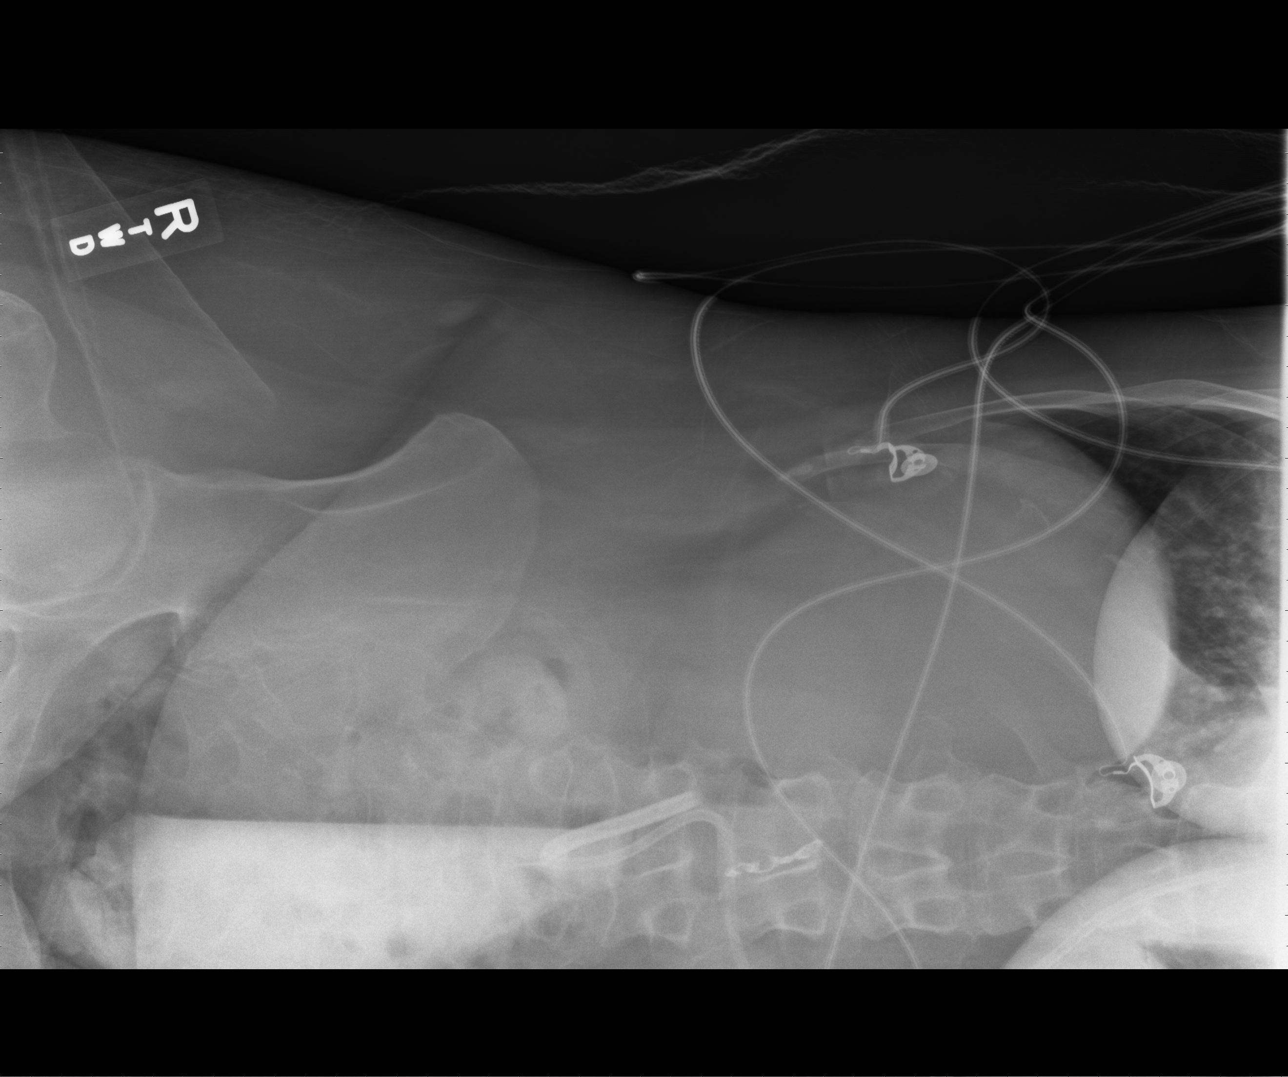

[3 of 3 positions shown; findings below may reference images not displayed]

FINDINGS: Tracheostomy is midline.  Heart size normal.  Right PICC
tip projects over the SVC.  Right upper lobe air space
consolidation persists.  No definite dependent pleural fluid.

Two views of the abdomen show a feeding tube coiled back on itself,
likely within the descending duodenum.  Barium is seen in the
ostomy bag, which overlies the stomach.  Bowel gas pattern is
unremarkable.
IMPRESSION: 1.  Feeding tube may be coiled upon itself in the descending
duodenum.  No additional acute findings in the abdomen.
2.  Right upper lobe air space disease.

## 2010-03-18 IMAGING — US US PELVIS COMPLETE MODIFY
1 series · 14 of 25 positions shown · non-contrast
Comparison: CT abdomen pelvis 07/13/2008

CLINICAL DATA: Pelvic pain.

TRANSABDOMINAL AND TRANSVAGINAL ULTRASOUND OF PELVIS
TECHNIQUE: Both transabdominal and transvaginal ultrasound
examinations of the pelvis were performed including evaluation of
the uterus, ovaries, adnexal regions, and pelvic cul-de-sac.

[Series 1: us pelvis complete modify · 0.28mm/px · 14 of 41 slices shown]
[im 1/41]
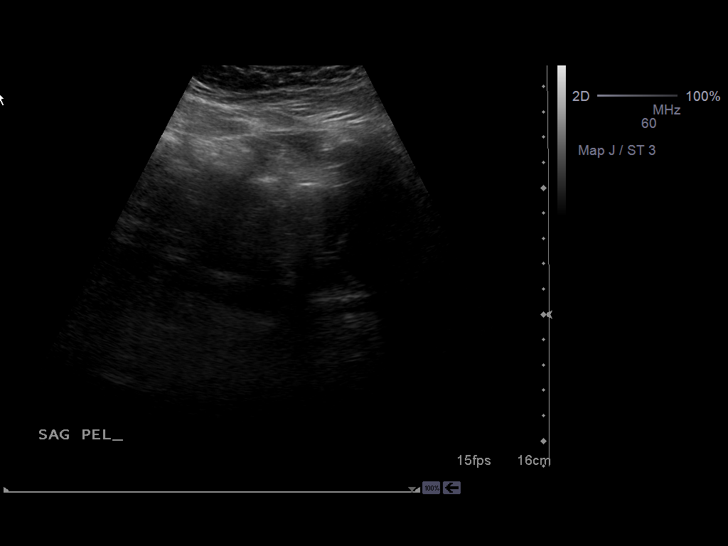
[im 4/41]
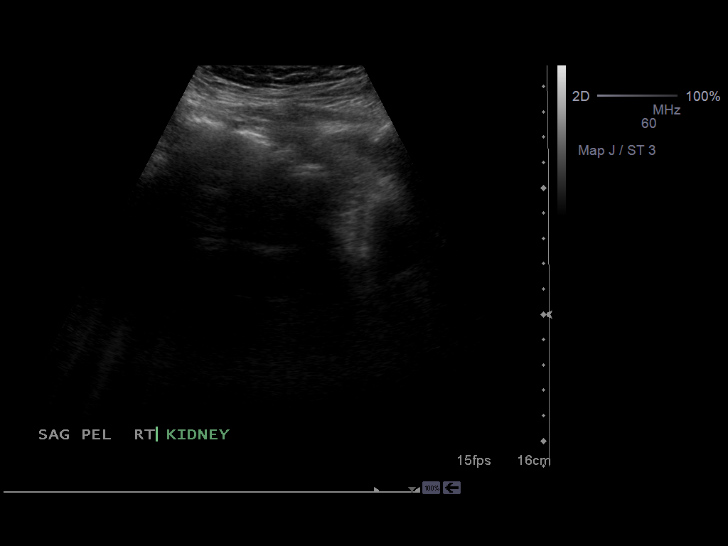
[im 7/41]
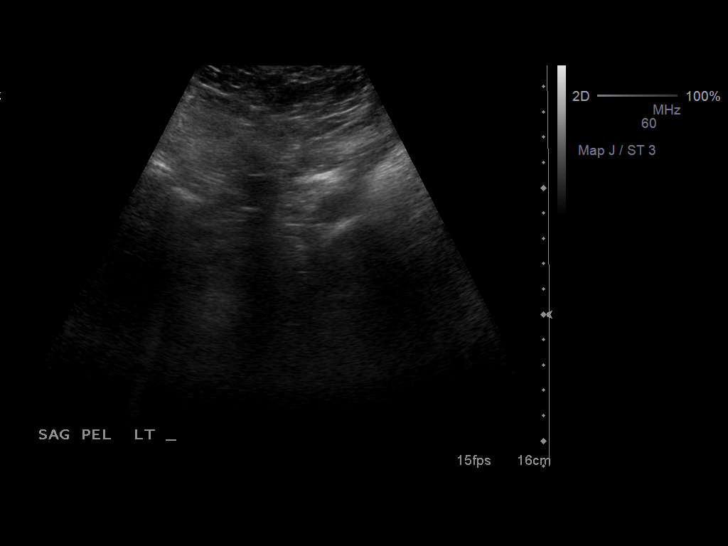
[im 11/41]
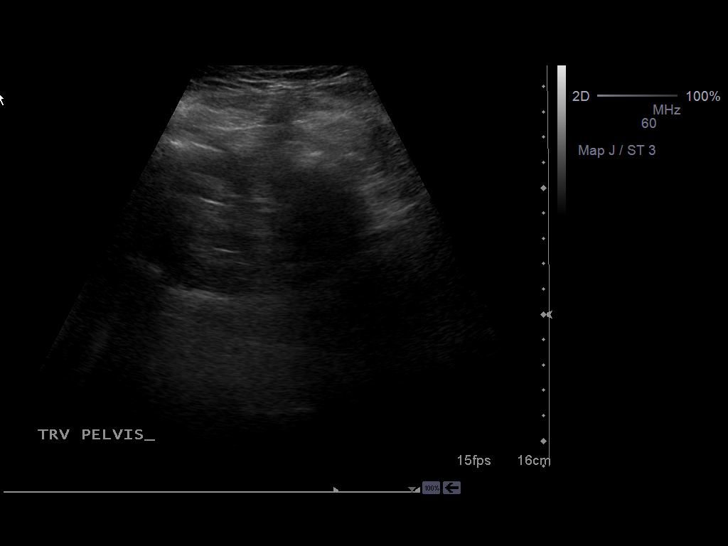
[im 14/41]
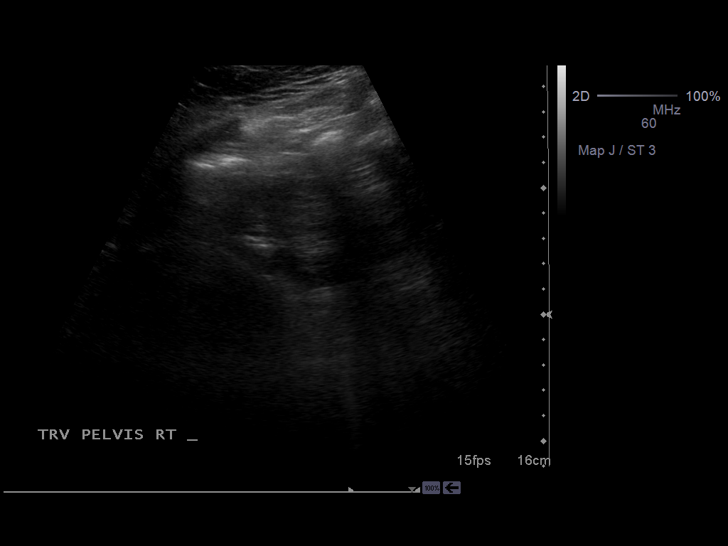
[im 16/41]
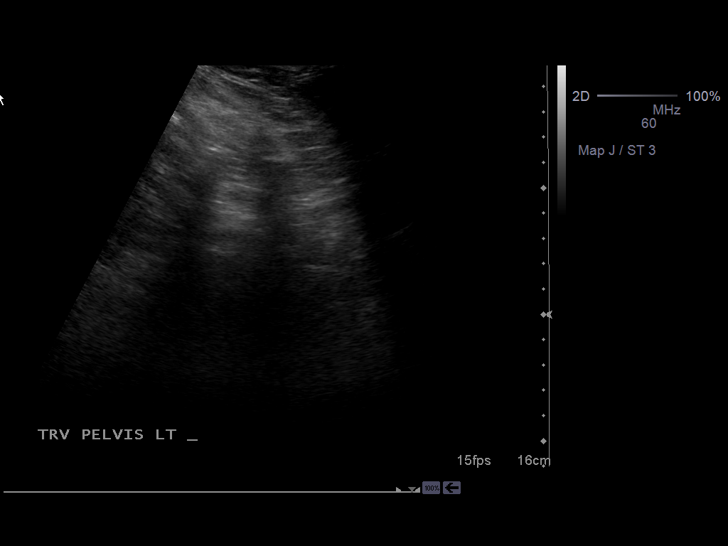
[im 19/41]
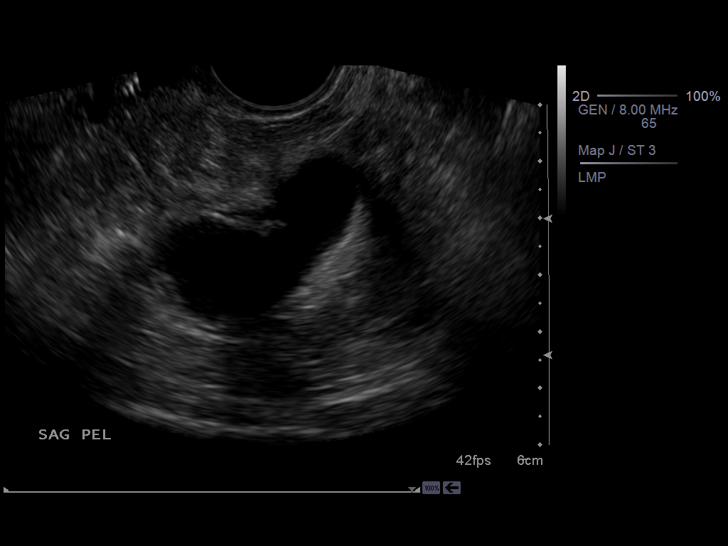
[im 22/41]
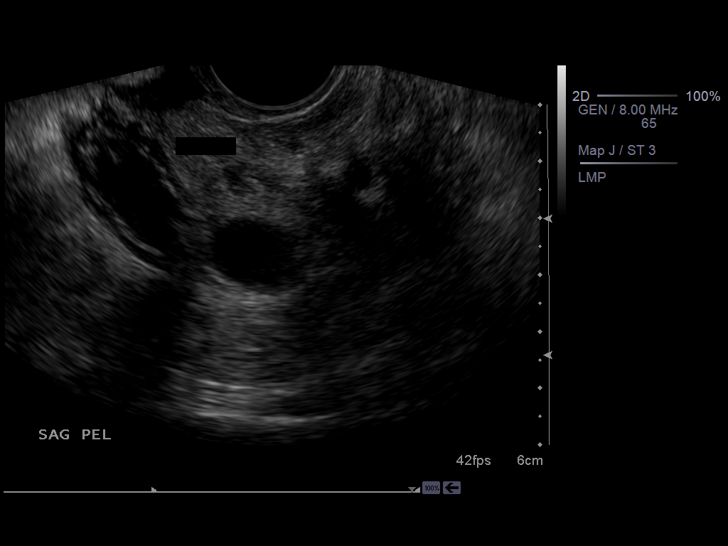
[im 26/41]
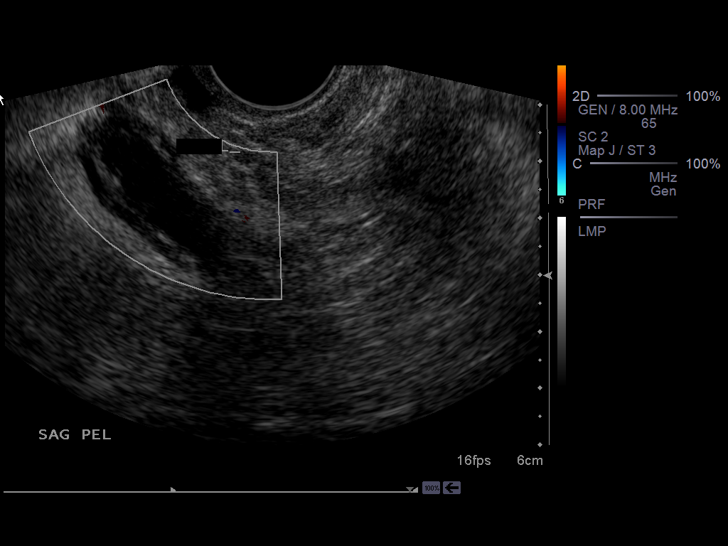
[im 27/41]
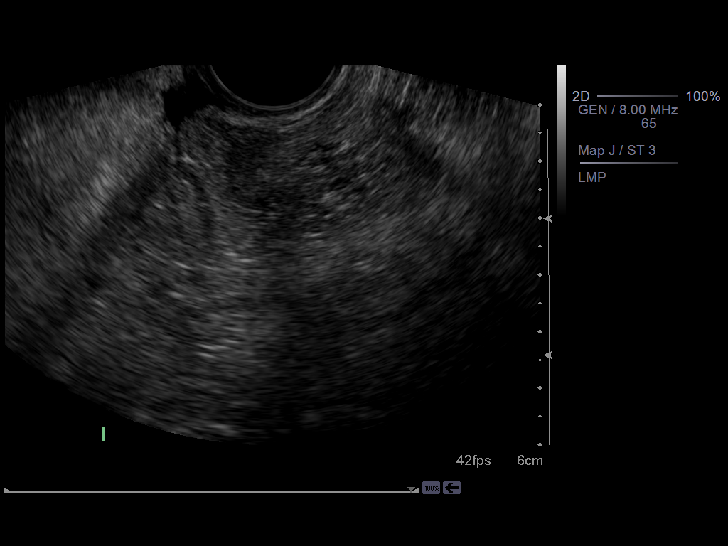
[im 31/41]
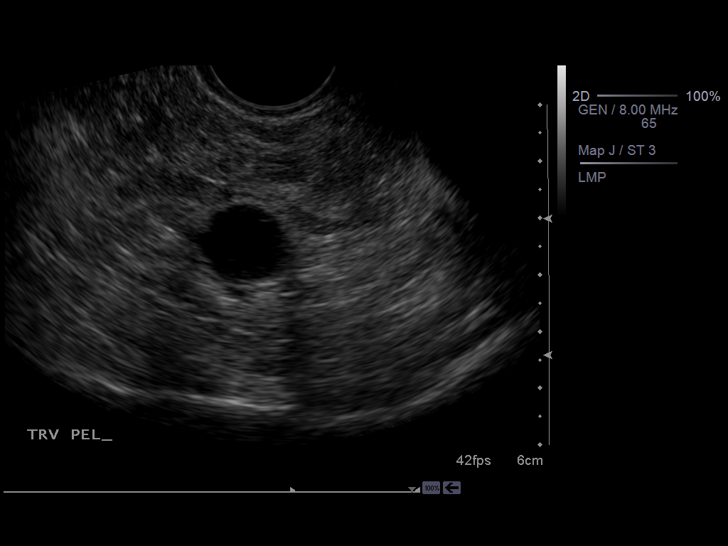
[im 34/41]
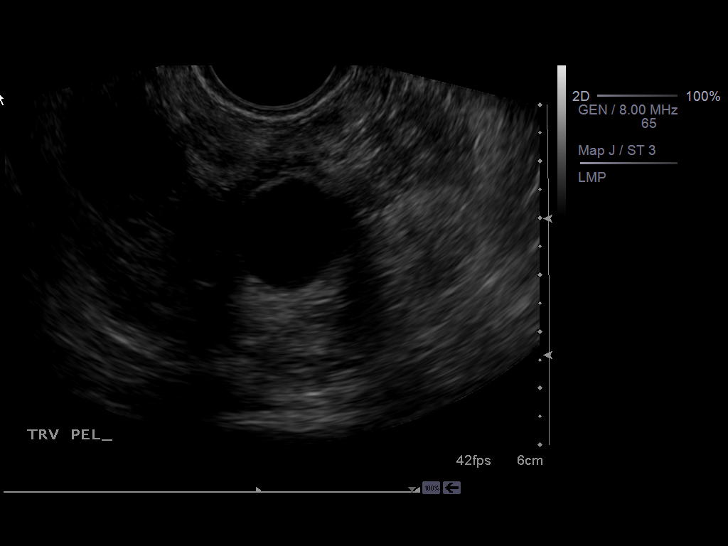
[im 37/41]
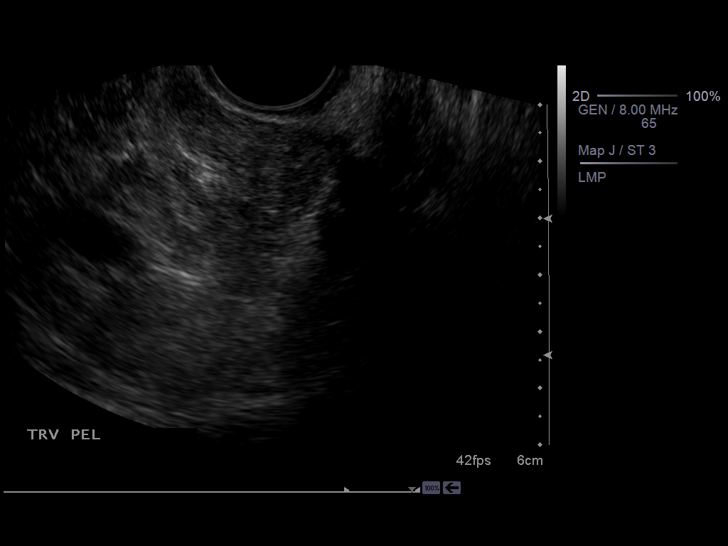
[im 41/41]
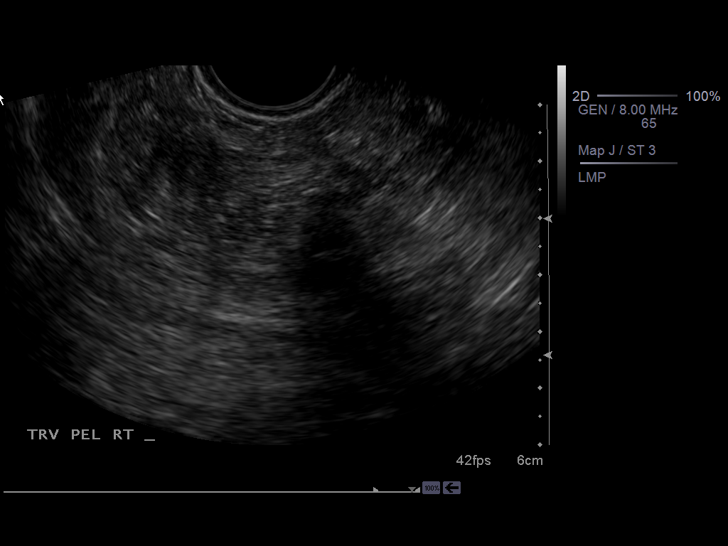

[14 of 25 positions shown; findings below may reference images not displayed]

FINDINGS: The patient is status post hysterectomy.  Ovaries are not
visualized.  The patient is unsure if they have been surgically
resected.  No free fluid.  Hartmann's pouch is visualized as a
tubular anechoic structure.
IMPRESSION: No acute findings.    Please see above.

## 2010-03-23 IMAGING — CR DG CHEST 1V PORT
1 series · 1 of 1 positions shown · non-contrast
Comparison: 07/21/2008 study

CLINICAL DATA: History of respiratory failure.

PORTABLE CHEST - 1 VIEW

[AP]
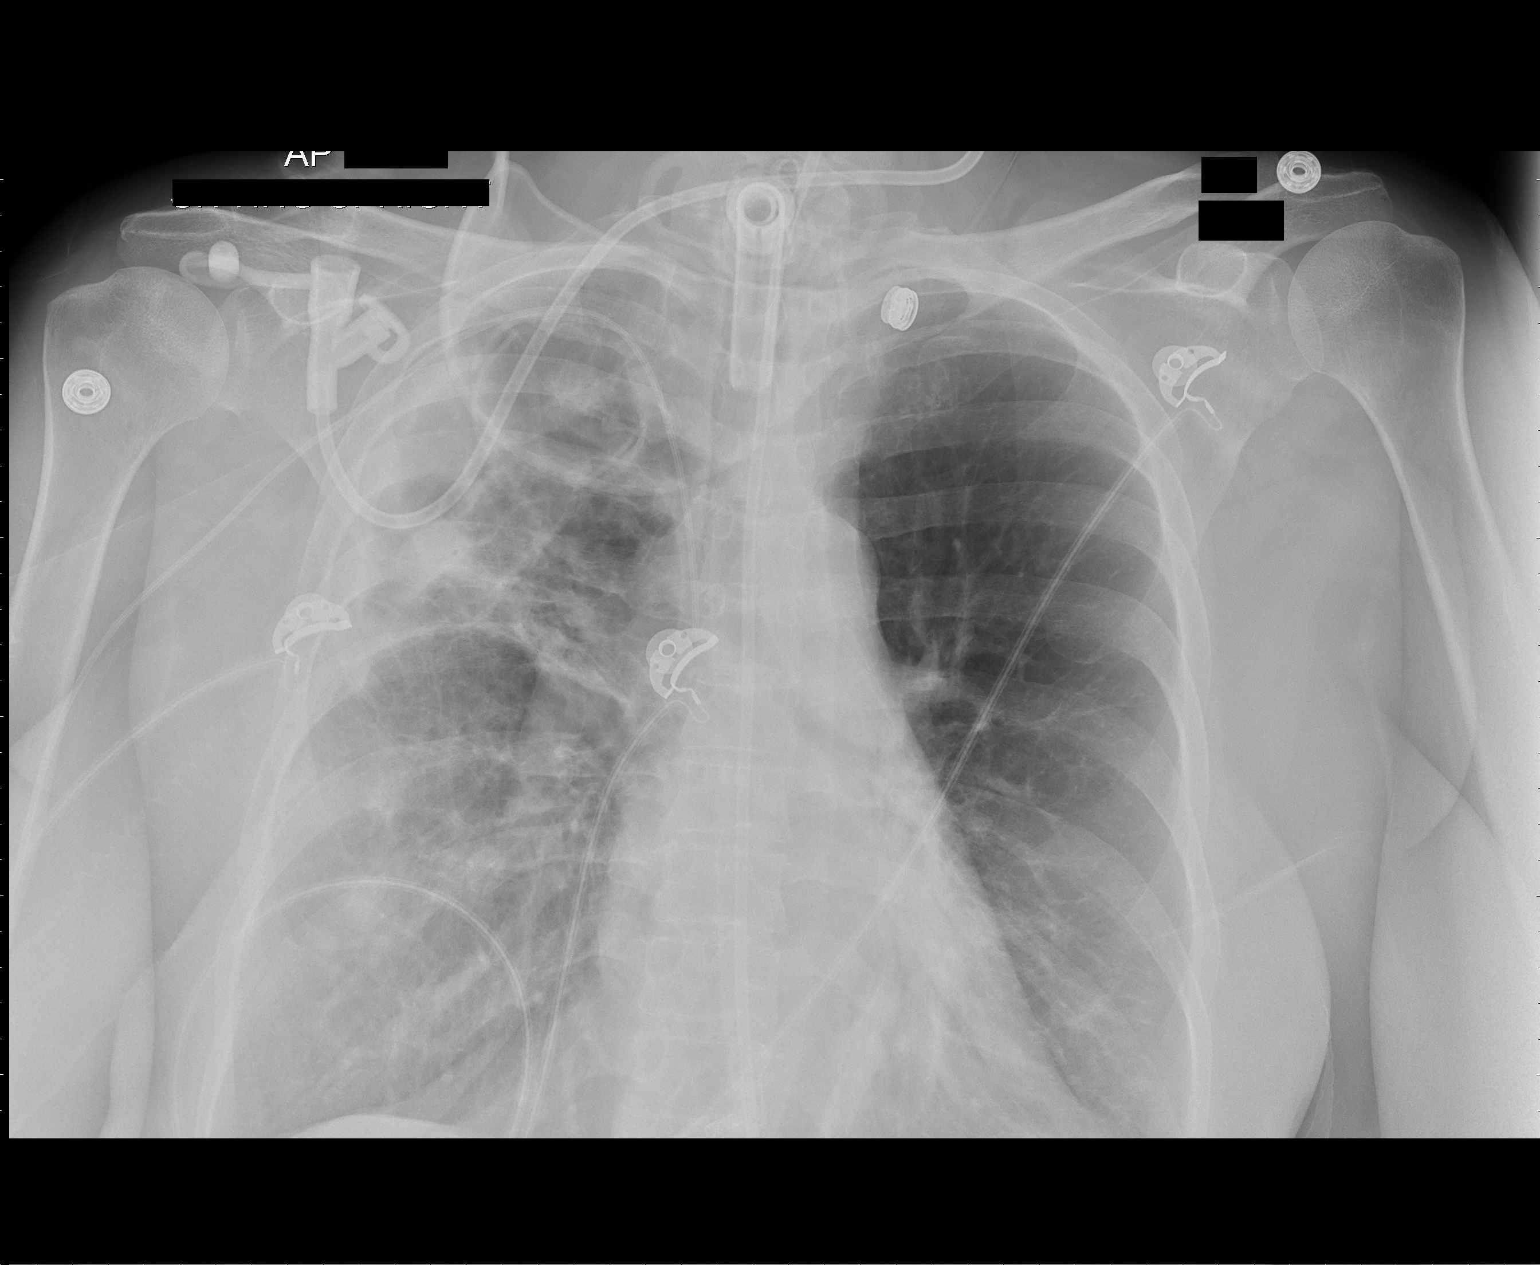

[1 of 1 positions shown; findings below may reference images not displayed]

FINDINGS: Tracheostomy tube is in place with its tip 6 cm above the
carina.  PICC is in place entering from the right with tip in the
proximal superior vena cava.  Enteric tube is in place with distal
portion entering the stomach.  Tip is not included on the image.
No pneumothorax or pleural effusion is seen.  Left lung is free of
infiltrates.  Haziness projecting over left base is felt to be
secondary to superimposed soft tissues.

Hazy infiltrative density is seen throughout the right lung with
some areas of nodularity.  There is some confluent disease within
the right upper lobe with nodularity present. Previously cavitation
has been demonstrated.  This is less prominent on the current
study.  This is similar to the CT examination of the 07/13/2008.
Cardiac silhouette is small.  It is normal shape.

Noncalcific pleural thickening is seen involving the lateral right
upper hemithorax.
IMPRESSION: Tracheostomy tube is in place with tip above carina.  PICC is in
place with no pneumothorax.  Enteric tube is in place with distal
portion and stomach area but tip not included on image.

Hazy infiltrative density is seen throughout the right lung with
some areas of nodularity.  There is some confluent disease within
the right upper lobe with nodularity present. Previously cavitation
has been demonstrated.  This is less prominent on the current
study.  This is similar to the CT examination of the 07/13/2008.

## 2010-03-27 IMAGING — CR DG CHEST 2V
1 series · 1 of 1 positions shown · non-contrast
Comparison: 08/02/2008

CLINICAL DATA: Respiratory failure

CHEST - 2 VIEW

[w chest lat]
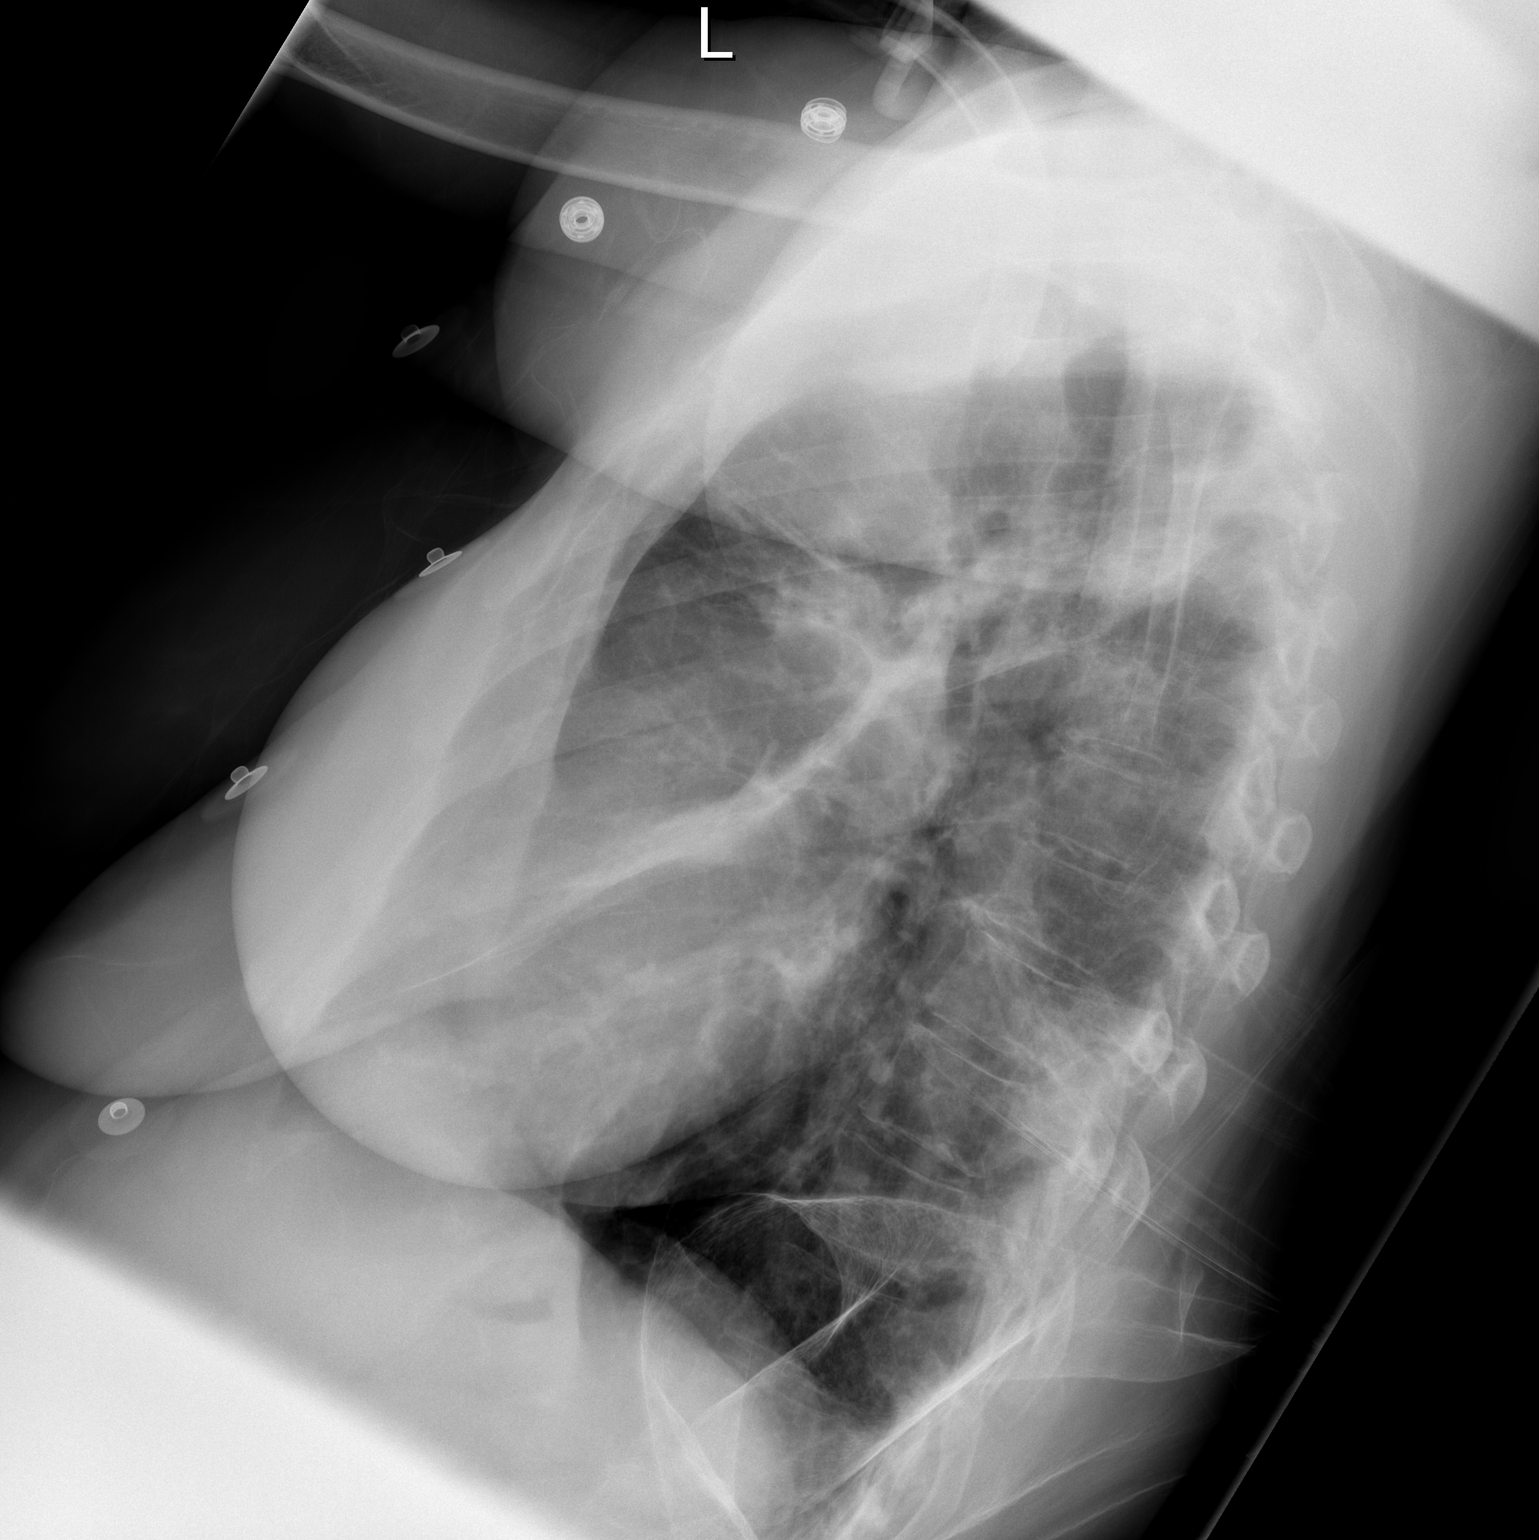

[1 of 1 positions shown; findings below may reference images not displayed]

FINDINGS: Heterogeneous opacities throughout the right lung are
stable.  Left lung is relatively clear with minimal scattered
atelectasis at the left base.  Tracheostomy tube is stable.  Right
PICC and feeding tube have been removed.  Tracheostomy tube is
stable.
IMPRESSION: Feeding tube and PICC removal.  Stable bilateral pulmonary
densities.

## 2010-04-05 IMAGING — CR DG CHEST 1V PORT
1 series · 1 of 1 positions shown · non-contrast
Comparison: Chest 08/06/2008.

CLINICAL DATA: Respiratory failure.

PORTABLE CHEST - 1 VIEW

[AP]
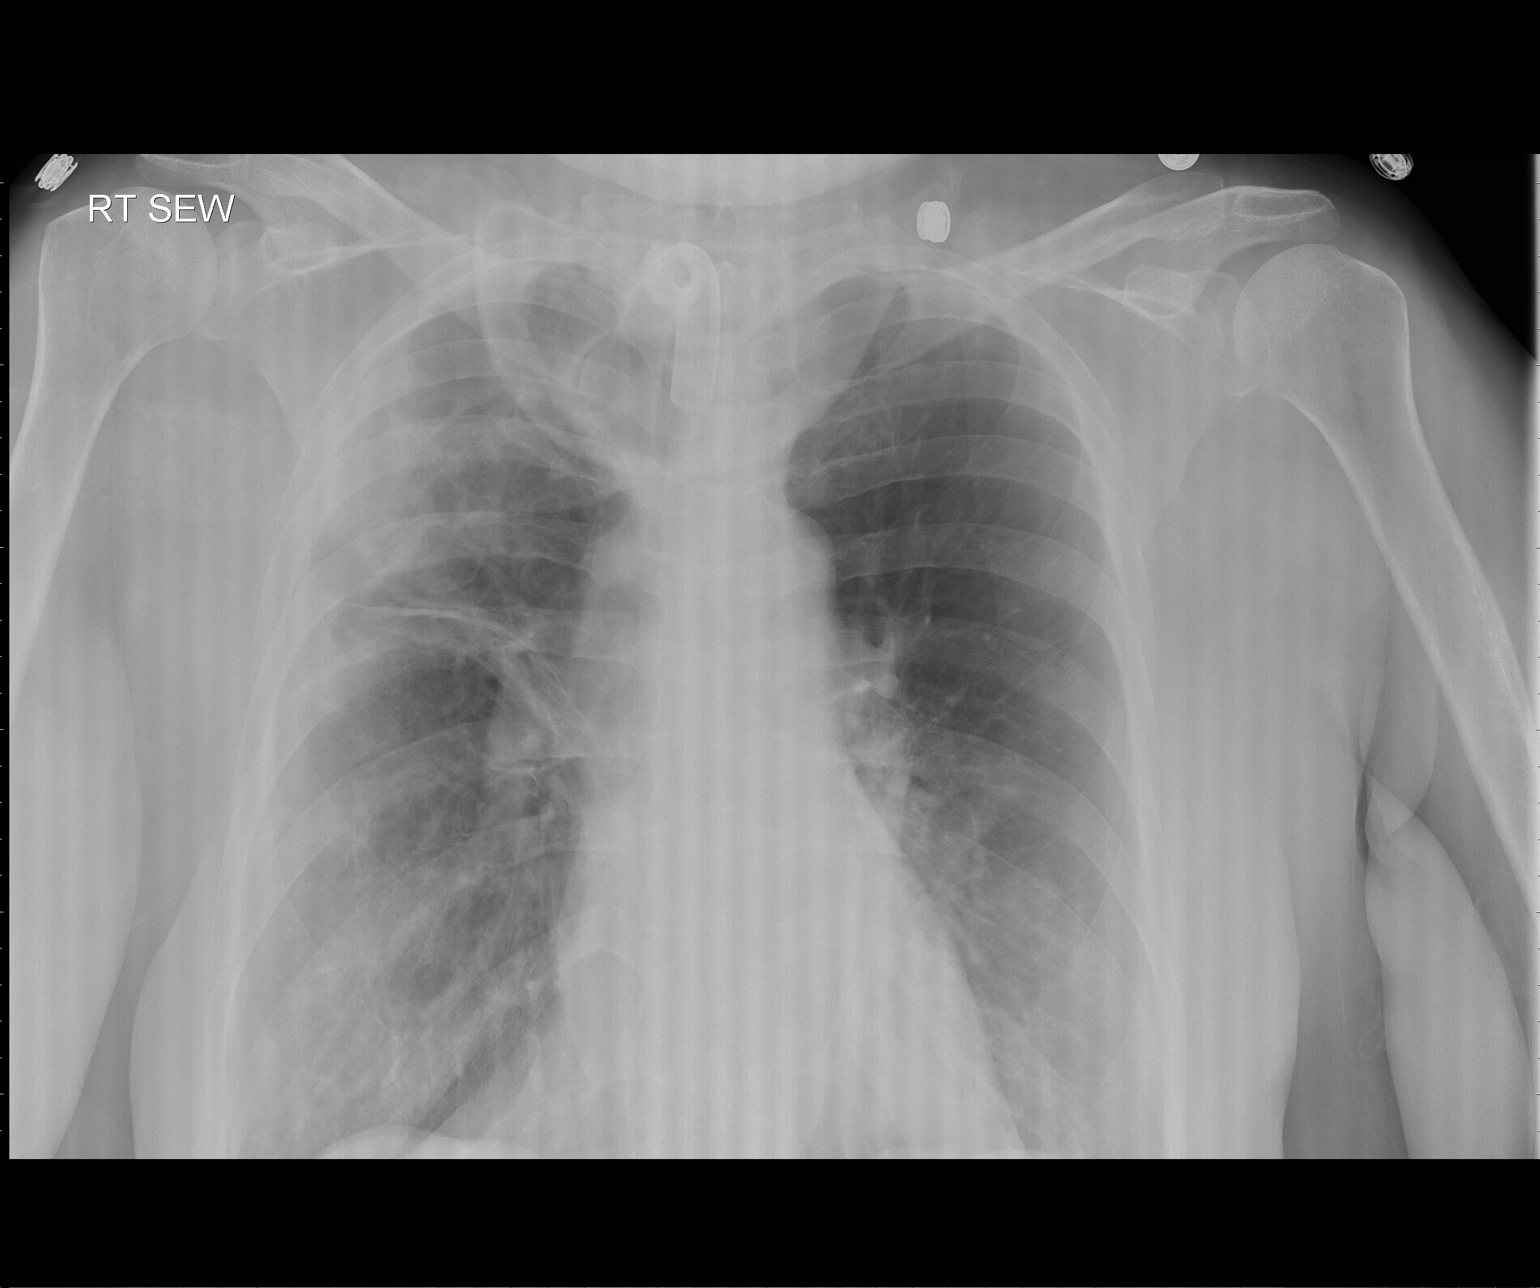

[1 of 1 positions shown; findings below may reference images not displayed]

FINDINGS: There has been some improvement patchy airspace opacity
in the right upper lung zone.  Linear opacity in the right mid lung
is most compatible with scar.  Left lung remains clear.  Heart size
normal.
IMPRESSION: Improved right upper lobe aeration.  Left lung remains clear and

## 2010-04-29 ENCOUNTER — Ambulatory Visit: Payer: Self-pay | Admitting: Critical Care Medicine

## 2010-05-01 ENCOUNTER — Telehealth (INDEPENDENT_AMBULATORY_CARE_PROVIDER_SITE_OTHER): Payer: Self-pay | Admitting: *Deleted

## 2010-05-02 IMAGING — CR DG CHEST 2V
2 series · 2 of 2 positions shown · non-contrast
Comparison: 08/15/2008

CLINICAL DATA: Asthma.  Chronic cough.  Emphysema.  Recent
pneumonia.

CHEST - 2 VIEW

[view not recorded (1 of 2)]
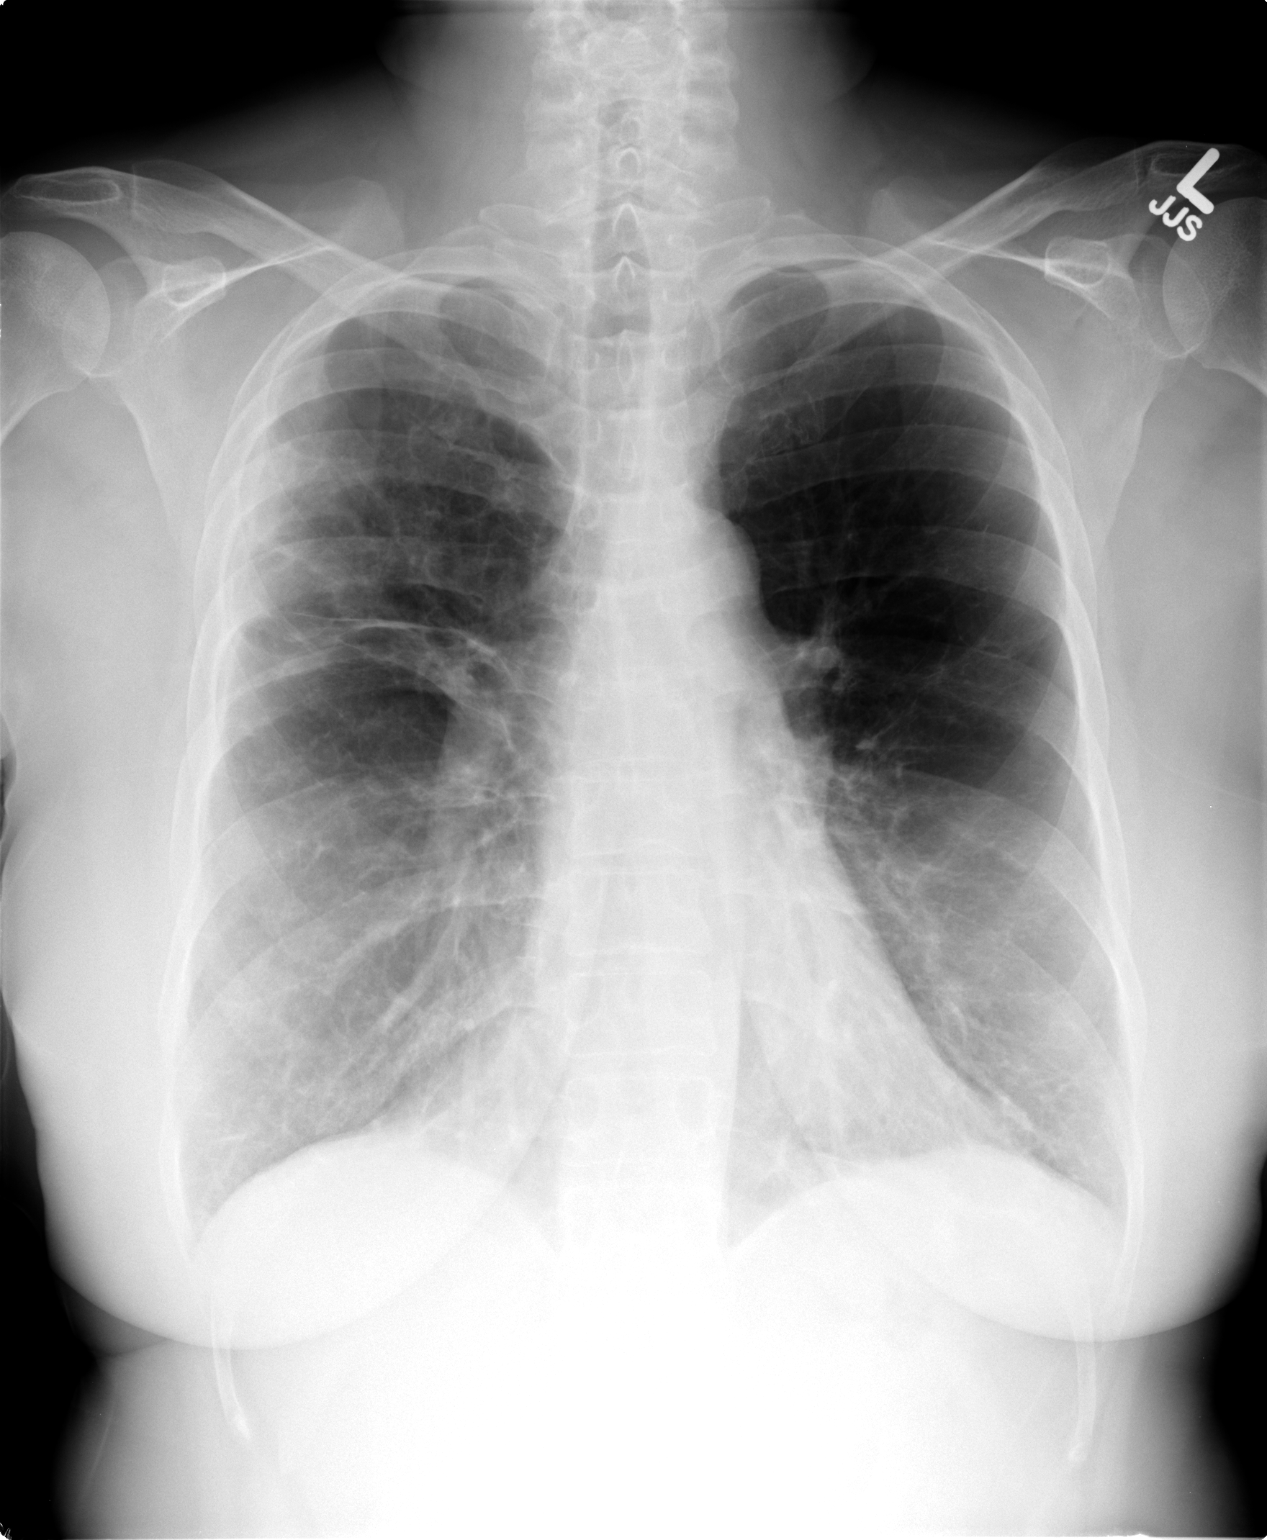

[view not recorded (2 of 2)]
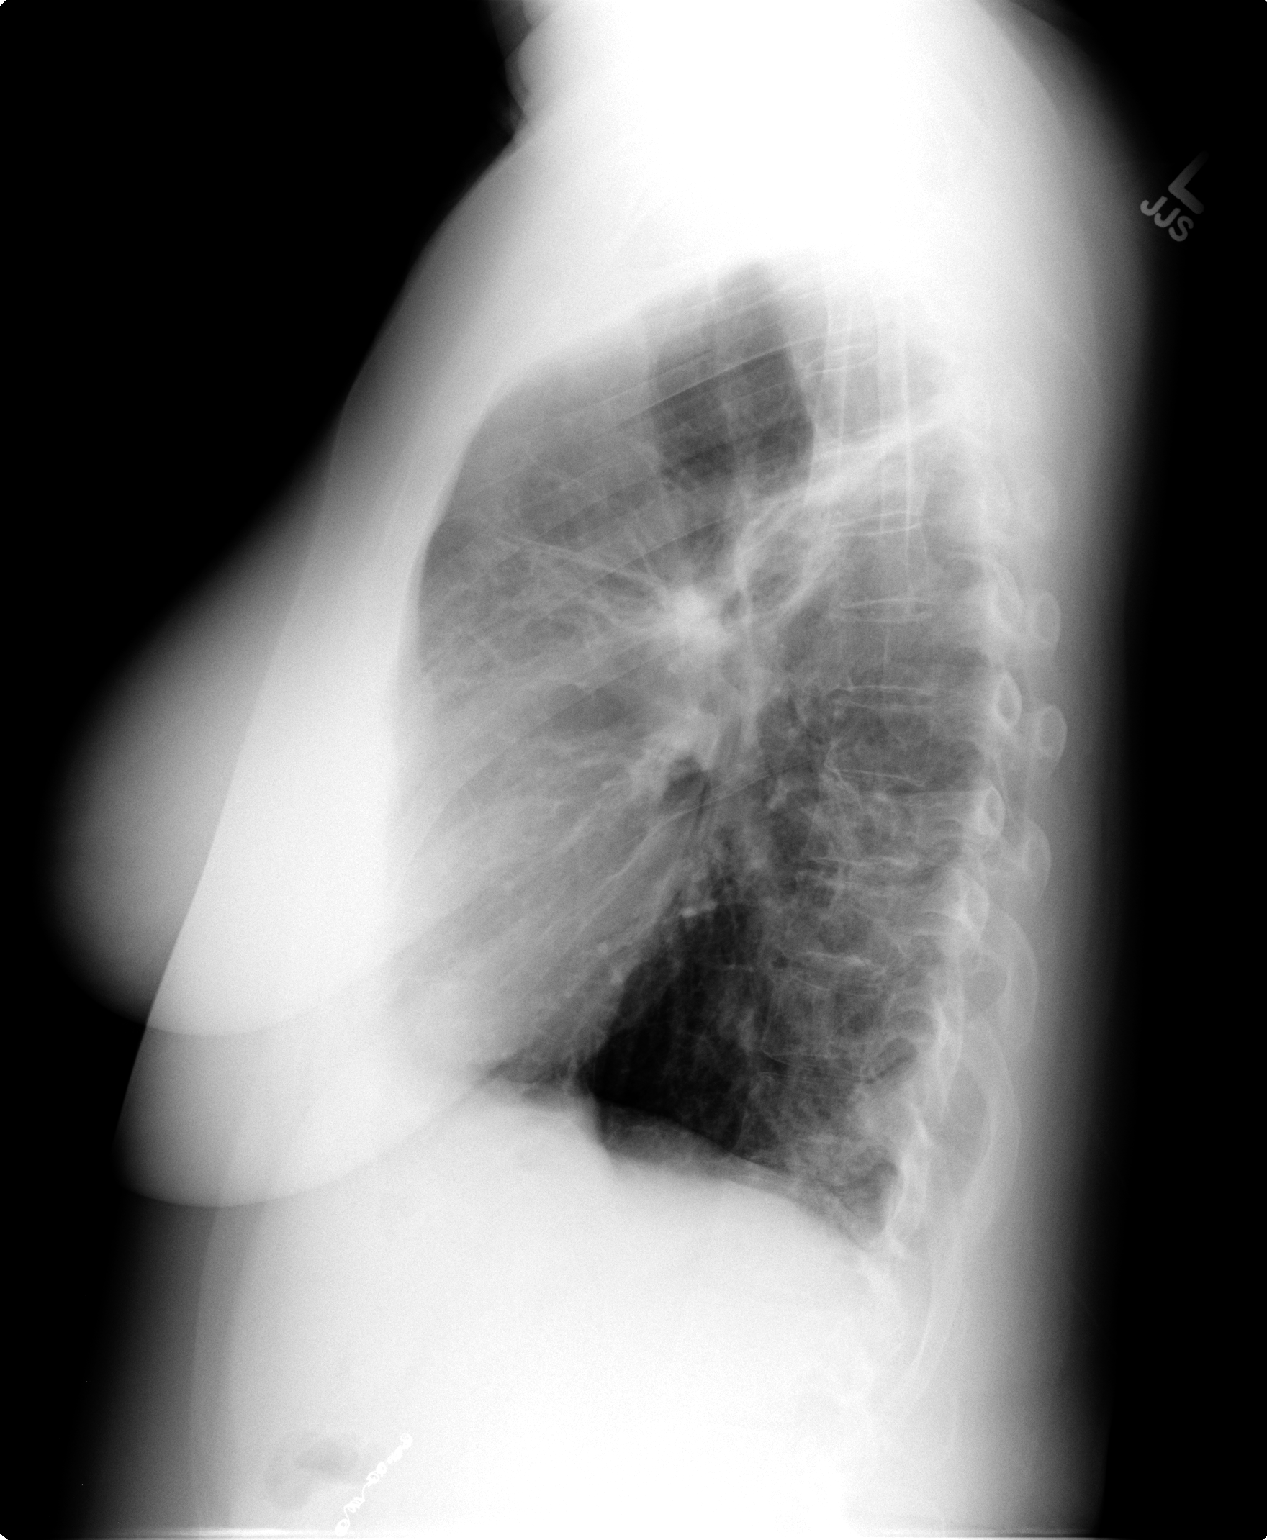

[2 of 2 positions shown; findings below may reference images not displayed]

FINDINGS: Changes of COPD are again seen.  Right upper lobe
scarring remains stable. There is no evidence of acute infiltrate
or edema.  There is no evidence of pleural effusion.

Heart size is normal.  No mass or adenopathy identified.
IMPRESSION: Stable COPD and right upper lobe scarring.  No active disease.

## 2010-05-30 ENCOUNTER — Telehealth (INDEPENDENT_AMBULATORY_CARE_PROVIDER_SITE_OTHER): Payer: Self-pay | Admitting: *Deleted

## 2010-06-12 ENCOUNTER — Telehealth (INDEPENDENT_AMBULATORY_CARE_PROVIDER_SITE_OTHER): Payer: Self-pay | Admitting: *Deleted

## 2010-06-13 ENCOUNTER — Encounter: Payer: Self-pay | Admitting: Critical Care Medicine

## 2010-06-13 ENCOUNTER — Ambulatory Visit
Admission: RE | Admit: 2010-06-13 | Discharge: 2010-06-13 | Payer: Self-pay | Source: Home / Self Care | Attending: Critical Care Medicine | Admitting: Critical Care Medicine

## 2010-06-16 ENCOUNTER — Encounter: Payer: Self-pay | Admitting: Gastroenterology

## 2010-06-17 ENCOUNTER — Inpatient Hospital Stay: Admission: RE | Admit: 2010-06-17 | Payer: Self-pay | Source: Home / Self Care | Admitting: Orthopedic Surgery

## 2010-06-17 ENCOUNTER — Encounter: Payer: Self-pay | Admitting: Obstetrics and Gynecology

## 2010-06-25 NOTE — Assessment & Plan Note (Signed)
Summary: Pulmonary OV   Copy to:  Dorothyann Peng Primary Provider/Referring Provider:  Dr. Dorothyann Peng  CC:  2 mo follow up.  states she will occasioanlly have "labored breathing and " states there is no rhyme or rythm to when this happens.  states she will have a "croup cough" occasioanlly-nonproductive.  c/o chest tightness when laying flat.  denies wheezing.  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 sl croupy cough.  No mucous,  just dry.  No chest pain.  No real wheeze.   Pt denies any significant sore throat, nasal congestion or excess secretions, fever, chills, sweats, unintended weight loss, pleurtic or exertional chest pain, orthopnea PND, or leg swelling  May 01, 2009 9:23 AM No changes made at last ov.  No flareups since 10/10.  No mucous (rarely)  still croupy cough.  No chest pain just occ chest tightness.   Pt denies any significant sore throat, nasal congestion or excess secretions, fever, chills, sweats, unintended weight loss, pleurtic or exertional chest pain, orthopnea PND, or leg swelling.   No at bedtime dyspnea.   Has some nasal mucous buildup.  July 11, 2009 9:50 AM Doing ok.  Had a sl viral syndrome one week ago.  URI with this .  Took Vit C and fluids and did ok. Now:  occ croupy cough at night.  Still hoarse.  ENT appt was rescheduled.  Dyspnea is ok and paces self.    Preventive Screening-Counseling & Management  Alcohol-Tobacco     Smoking Status: quit > 6 months  Clinical Reports Reviewed:  CXR:  09/11/2008: CXR Results:  abnormal:   IMPRESSION: Stable COPD and right upper lobe scarring.  No active disease.  05/05/2008: CXR Results:  abnormal:   IMPRESSION: Stable right upper lobe nodule.   COPD.   Current Medications (verified): 1)  Spiriva Handihaler 18 Mcg  Caps  (Tiotropium Bromide Monohydrate) .... Two Puffs in Handihaler Daily 2)  Furosemide 20 Mg  Tabs (Furosemide) .... Two Times A Day 3)  Prednisone 5 Mg  Tabs (Prednisone) .... One By Mouth Once Daily 4)  Cardizem Cd 180 Mg  Cp24 (Diltiazem Hcl Coated Beads) .... One By Mouth Three Times A Day 5)  Omeprazole 40 Mg Cpdr (Omeprazole) .[name].. 1 By Mouth Two Times A Day 6)  Proventil Hfa 108 (90 Base) Mcg/act  Aers (Albuterol Sulfate) .[name].. 1-2 Puffs Every 4-6 Hours As Needed 7)  Reclast 5 Mg/183ml  Soln (Zoledronic Acid) .... Iv Infusion Yearly 8)  Vitamin D 1000 Unit Tabs (Cholecalciferol) .... 2 Tabs Once Daily 9)  Gabapentin 300 Mg Caps (Gabapentin) .[name].. 1 By Mouth Three Times A Day As Needed 10)  Crestor 10 Mg Tabs (Rosuvastatin Calcium) .[name].. 1 By Mouth At Bedtime 11)  Lunesta 2 Mg Tabs (Eszopiclone) .[name].. 1 By Mouth At Bedtime 12)  Nadolol 20 Mg Tabs (Nadolol) .... Once Daily and Take 2nd Tab As Needed 13)  Mobic 7.5 Mg Tabs (Meloxicam) .... As Needed 14)  Accolate 20 Mg Tabs (Zafirlukast) .... One By Mouth Two Times A Day 15)  Hydrocodone-Acetaminophen 5-500 Mg  Tabs (Hydrocodone-Acetaminophen) .[name].. 1 To 2 By Mouth Every 6 Hour As Needed Pain 16)  Victoza 18 Mg/15ml Soln (Liraglutide) .... Once Daily As Directed  Allergies (verified): 1)  ! * Nexium  Past History:  Past medical, surgical, family and social histories (including risk factors) reviewed, and no changes noted (except as noted below).  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 Diabetes, Type 2     -diet controlled  Past Surgical History: Reviewed history  from 12/15/2007 and no changes required. 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 non contrib  Social History: Reviewed history from 06/02/2007 and no changes required. Patient states former smoker.   Review of Systems       The patient complains of shortness of breath with activity, non-productive cough, and nasal congestion/difficulty breathing through nose.  The patient denies shortness of breath at rest, productive cough, coughing up blood, chest pain, irregular heartbeats, acid heartburn, indigestion, loss of appetite, weight change, abdominal pain, difficulty swallowing, sore throat, tooth/dental problems, headaches, sneezing, itching, ear ache, anxiety, depression, hand/feet swelling, joint stiffness or pain, rash, change in color of mucus, and fever.    Vital Signs:  Patient profile:   60 year old female Height:      62 inches Weight:      156.13 pounds BMI:     28.66 O2 Sat:      92 % on 1.5 L/minpulsed Temp:     97.8 degrees F oral Pulse rate:   102 / minute BP sitting:   120 / 78  (left arm) Cuff size:   regular  Vitals Entered By: Gweneth Dimitri RN (July 11, 2009 9:38 AM)  O2 Flow:  1.5 L/minpulsed CC: 2 mo follow up.  states she will occasioanlly have "labored breathing," states there is no rhyme or rythm to when this happens.  states she will have a "croup cough" occasioanlly-nonproductive.  c/o chest tightness when laying flat.  denies wheezing.  states she feels she is doing well overall-is now more active. Comments Medications reviewed with patient Daytime contact number verified with patient. Gweneth Dimitri RN  July 11, 2009 9:39 AM    Physical Exam  Additional Exam:  Gen: Pleasant, well-nourished, in no distress , normal affect, cushingoid facies ENT: no lesions, no post nasal drip, trach site well healed,  orophyx clear Neck: No JVD, no TMG, no carotid  bruits Lungs: No use of accessory muscles, no dullness to percussion, distant BS, Cardiovascular: RRR, heart sounds normal, no murmurs or gallops, no peripheral edema Abdomen: soft and non-tender, no HSM, BS normal Musculoskeletal: No deformities, no cyanosis or clubbing Neuro: alert, non-focal     Impression & Recommendations:  Problem # 1:  COPD (ICD-496) Assessment Unchanged  Golds stage IV  COPD oxygen dependent  stable at this time  plan: No change in medications rov 2-3 months  Problem # 2:  VOCAL CORD DISORDER (ICD-478.5) Assessment: Unchanged  Ongoing hoarseness due to Vocal cord weakness s/p prolonged intubation plan f/u ENT  Medications Added to Medication List This Visit: 1)  Furosemide 20 Mg Tabs (Furosemide) .... Two times a day 2)  Prednisone 5 Mg Tabs (Prednisone) .... 1/2 tablet daily 3)  Nadolol 20 Mg Tabs (Nadolol) .... Once daily and take 2nd tab as needed  Complete Medication List: 1)  Spiriva Handihaler 18  Mcg Caps (Tiotropium bromide monohydrate) .... Two puffs in handihaler daily 2)  Furosemide 20 Mg Tabs (Furosemide) .... Two times a day 3)  Prednisone 5 Mg Tabs (Prednisone) .... 1/2 tablet daily 4)  Cardizem Cd 180 Mg Cp24 (Diltiazem hcl coated beads) .... One by mouth three times a day 5)  Omeprazole 40 Mg Cpdr (Omeprazole) .[name].. 1 by mouth two times a day 6)  Proventil Hfa 108 (90 Base) Mcg/act Aers (Albuterol sulfate) .[name].. 1-2 puffs every 4-6 hours as needed 7)  Reclast 5 Mg/165ml Soln (Zoledronic acid) .... Iv infusion yearly 8)  Vitamin D 1000 Unit Tabs (Cholecalciferol) .... 2 tabs once daily 9)  Gabapentin 300 Mg Caps (Gabapentin) .[name].. 1 by mouth three times a day as needed 10)  Crestor 10 Mg Tabs (Rosuvastatin calcium) .[name].. 1 by mouth at bedtime 11)  Lunesta 2 Mg Tabs (Eszopiclone) .[name].. 1 by mouth at bedtime 12)  Nadolol 20 Mg Tabs (Nadolol) .... Once daily and take 2nd tab as needed 13)  Mobic 7.5 Mg Tabs (Meloxicam) .... As needed 14)   Accolate 20 Mg Tabs (Zafirlukast) .... One by mouth two times a day 15)  Hydrocodone-acetaminophen 5-500 Mg Tabs (Hydrocodone-acetaminophen) .[name].. 1 to 2 by mouth every 6 hour as needed pain 16)  Victoza 18 Mg/53ml Soln (Liraglutide) .... Once daily as directed  Other Orders: Est. Patient Level III (81191)  Patient Instructions: 1)  Reduce prednisone to 1/2 tablet daily 2)  Return in 2-3 months  Appended Document: Pulmonary OV fax Dorothyann Peng; [place]

## 2010-06-25 NOTE — Progress Notes (Signed)
Summary: [facility]-pt assistance med received  Phone Note Outgoing Call   Call placed by: Gweneth Dimitri RN,  November 14, 2009 2:59 PM Summary of Call: Received Pt's assistance med [facility].  Placed at front desk along with forms.  Pt aware of this and verbalized understanding.   Initial call taken by: Gweneth Dimitri RN,  November 14, 2009 3:01 PM

## 2010-06-25 NOTE — Assessment & Plan Note (Signed)
Summary: Pulmonary OV   Copy to:  Dorothyann Peng Primary Leiland Mihelich/Referring Brigett Estell:  Dr. Dorothyann Peng  CC:  Pt here for follow up. Pt states breathing is better. Pt c/o no energy.  History of Present Illness:  60  yo woman with severe COPD Golds Stage IV    September 18, 2009 9:42 AM No issues. and down to 2.5mg /d pred.  No real cough.  Occ croupy cough.  Hoarseness is still present. Dyspnea is at baseline.  No mucous except in early am.   overall this patient is stable compared to prior visits  Oct 09, 2009--Presents for an acute office visit. Complains of  1 week of cough, severe at time, keeping up at night, ribs sore from coughing. She has been doing very well w/ no recent flare for >6 months. She is on Prednisone 2.5mg  once daily. Cough is mainly dry w/ occasion yellow mucus. Denies chest pain, orthopnea, hemoptysis, fever, n/v/d, edema, headache. No recent abx or travel. OTC not helping.   November 20, 2009 3:22 PM [facility] 5/17- 5/22  with copd exac.  Now is better. Here with all meds in a bag and med list.  The list correlates perfectly with meds in bag. The pt is now better.  SHe will feel tight on occasion. There is no real cough.  There is no mucous . Pt denies any significant sore throat, nasal congestion or excess secretions, fever, chills, sweats, unintended weight loss, pleurtic or exertional chest pain, orthopnea PND, or leg swelling Pt denies any increase in rescue therapy over baseline, denies waking up needing it or having any early am or nocturnal exacerbations of coughing/wheezing/or dyspnea.   January 14, 2010 10:53 AM No problems since last ov 6/11.  Now no cough, except in the early am.  Dyspnea is at baseline.  No real chest pain.  No real wheeze. No new issues    Preventive Screening-Counseling & Management  Alcohol-Tobacco     Smoking Status: quit > 6 months  Current Medications (verified): 1)  Furosemide 20 Mg  Tabs (Furosemide) .... Take One By Mouth Two Times  A Day As Needed 2)  Prednisone 5 Mg  Tabs (Prednisone) .... 1/2 Tablet Daily 3)  Cardizem Cd 180 Mg  Cp24 (Diltiazem Hcl Coated Beads) .... One By Mouth Three Times A Day 4)  Omeprazole 40 Mg Cpdr (Omeprazole) .[name].. 1 By Mouth Two Times A Day 5)  Proventil Hfa 108 (90 Base) Mcg/act  Aers (Albuterol Sulfate) .[name].. 1-2 Puffs Every 4-6 Hours As Needed 6)  Reclast 5 Mg/142ml  Soln (Zoledronic Acid) .... Iv Infusion Yearly 7)  Vitamin D 500 .... Take 1 Tablet By Mouth Once A Day 8)  Gabapentin 300 Mg Caps (Gabapentin) .[name].. 1 By Mouth Three Times A Day As Needed 9)  Crestor 10 Mg Tabs (Rosuvastatin Calcium) .[name].. 1 By Mouth At Bedtime 10)  Lunesta 2 Mg Tabs (Eszopiclone) .[name].. 1 By Mouth At Bedtime 11)  Bisoprolol Fumarate 5 Mg Tabs (Bisoprolol Fumarate) .... Take 1 Tablet By Mouth Two Times A Day 12)  Mobic 7.5 Mg Tabs (Meloxicam) .... As Needed 13)  Hydrocodone-Acetaminophen 5-500 Mg  Tabs (Hydrocodone-Acetaminophen) .[name].. 1 To 2 By Mouth Every 6 Hour As Needed Pain 14)  Victoza 18 Mg/22ml Soln (Liraglutide) .... Once Daily As Directed 15)  Vitamin E 400 Unit Caps (Vitamin E) .... Take 1 Capsule By Mouth Once A Day 16)  Multivitamins   Tabs (Multiple Vitamin) .... Take 1 Tablet By Mouth Once A Day  17)  Magnesium .... Take 1 Tablet By Mouth Once A Day 18)  Vitamin C 1000 Mg Tabs (Ascorbic Acid) .... Take 2 Tablet By Mouth Once A Day 19)  Spiriva Handihaler 18 Mcg  Caps (Tiotropium Bromide Monohydrate) .... Two Puffs in Handihaler Daily 20)  Accolate 20 Mg Tabs (Zafirlukast) .... One By Mouth Two Times A Day 21)  Citalopram Hydrobromide 20 Mg Tabs (Citalopram Hydrobromide) .... Once Daily 22)  Flutter Valve .... Qid 23)  Mucinex 600 Mg Xr12h-Tab (Guaifenesin) .[name].. 1 in Am, 2 At Noon,  1 in Pm 24)  Oxygen .... 1.5-2 Liters 25)  Lorazepam 0.5 Mg Tabs (Lorazepam) .... As Needed  Allergies (verified): 1)  ! * Nexium  Past History:  Past medical, surgical, family and social histories (including risk  factors) reviewed, and no changes noted (except as noted below).  Past Medical History: Reviewed history from 07/11/2009 and no changes required. 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 Diabetes, Type 2     -diet controlled  Past Surgical History: Reviewed history from 12/15/2007 and no changes required. 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 non contrib  Social History: Reviewed history from 06/02/2007 and no changes required. Patient states former smoker.   Review of Systems       The patient complains of shortness of breath with activity and non-productive cough.  The patient denies shortness of breath at rest, productive cough, coughing up blood, chest pain, irregular heartbeats, acid heartburn, indigestion, loss of appetite, weight change, abdominal pain, difficulty swallowing, sore throat, tooth/dental problems, headaches, nasal congestion/difficulty breathing through nose, sneezing, itching, ear ache, anxiety, depression, hand/feet swelling, joint stiffness or pain, rash, change in color of mucus, and fever.    Vital Signs:  Patient profile:   60 year old female Height:      62 inches Weight:      145.50 pounds BMI:     26.71 O2 Sat:      95 % on 2 L/min Temp:     98.1 degrees F oral Pulse rate:   95 / minute BP sitting:   104 / 66  (left arm) Cuff size:   regular  Vitals Entered By: Zackery Barefoot CMA (January 14, 2010 10:43 AM)  Nutrition Counseling: Patient's BMI is greater than 25 and therefore counseled on weight management  options.  O2 Flow:  2 L/min CC: Pt here for follow up. Pt states breathing is better. Pt c/o no energy Comments Medications reviewed with patient Verified contact number and pharmacy with patient Zackery Barefoot CMA  January 14, 2010 10:44 AM    Physical Exam  Additional Exam:  Gen: Pleasant, well-nourished, in no distress , normal affect, cushingoid facies ENT: no lesions, no post nasal drip, trach site well healed,  orophyx clear Neck: No JVD, no TMG, no carotid bruits Lungs: coarse BS w/ faint exp wheeze, upper airway psuedowheeze  Cardiovascular: RRR, heart sounds normal, no murmurs or gallops, no peripheral edema Abdomen: soft and non-tender, no HSM, BS normal Musculoskeletal: No deformities, no cyanosis or clubbing Neuro: alert, non-focal     Impression & Recommendations:  Problem # 1:  COPD (ICD-496) Assessment Unchanged Severe copd with primary emphysema component plan No change in inhaled medications.   Maintain treatment program as currently prescribed.  Medications Added to Medication List This Visit: 1)  Vitamin D 500  .... Take 1 tablet by mouth once a day 2)  Bisoprolol Fumarate 5 Mg Tabs (Bisoprolol fumarate) .... Take 1 tablet by mouth two times a day  Complete Medication List: 1)  Furosemide 20 Mg Tabs (Furosemide) .... Take one by mouth two times a day as needed 2)  Prednisone 5 Mg Tabs (Prednisone) .... 1/2 tablet daily 3)  Cardizem Cd 180 Mg Cp24 (Diltiazem hcl coated beads) .... One by mouth three times a day 4)  Omeprazole 40 Mg Cpdr (Omeprazole) .[name].. 1 by mouth two times a day 5)  Proventil Hfa 108 (90 Base) Mcg/act Aers (Albuterol sulfate) .[name].. 1-2 puffs every 4-6 hours as needed 6)  Reclast 5 Mg/157ml Soln (Zoledronic acid) .... Iv infusion yearly 7)  Vitamin D 500  .... Take 1 tablet by mouth once a day 8)  Gabapentin 300 Mg Caps (Gabapentin) .[name].. 1 by mouth three times a day as needed 9)  Crestor 10 Mg Tabs (Rosuvastatin calcium) .[name].. 1 by mouth  at bedtime 10)  Lunesta 2 Mg Tabs (Eszopiclone) .[name].. 1 by mouth at bedtime 11)  Bisoprolol Fumarate 5 Mg Tabs (Bisoprolol fumarate) .... Take 1 tablet by mouth two times a day 12)  Mobic 7.5 Mg Tabs (Meloxicam) .... As needed 13)  Hydrocodone-acetaminophen 5-500 Mg Tabs (Hydrocodone-acetaminophen) .[name].. 1 to 2 by mouth every 6 hour as needed pain 14)  Victoza 18 Mg/7ml Soln (Liraglutide) .... Once daily as directed 15)  Vitamin E 400 Unit Caps (Vitamin e) .... Take 1 capsule by mouth once a day 16)  Multivitamins Tabs (Multiple vitamin) .... Take 1 tablet by mouth once a day 17)  Magnesium  .... Take 1 tablet by mouth once a day 18)  Vitamin C 1000 Mg Tabs (Ascorbic acid) .... Take 2 tablet by mouth once a day 19)  Spiriva Handihaler 18 Mcg Caps (Tiotropium bromide monohydrate) .... Two puffs in handihaler daily 20)  Accolate 20 Mg Tabs (Zafirlukast) .... One by mouth two times a day 21)  Citalopram Hydrobromide 20 Mg Tabs (Citalopram hydrobromide) .... Once daily 22)  Flutter Valve  .... Qid 23)  Mucinex 600 Mg Xr12h-tab (Guaifenesin) .[name].. 1 in am, 2 at noon,  1 in pm 24)  Oxygen  .... 1.5-2 liters 25)  Lorazepam 0.5 Mg Tabs (Lorazepam) .... As needed  Other Orders: Est. Patient Level III (16109)  Patient Instructions: 1)  No change in medications 2)  Return in      3    months 3)  No steam or sauna

## 2010-06-25 NOTE — Progress Notes (Signed)
Summary: FYI: flu shot  Phone Note Call from Patient Call back at [facility] Phone [phone]   Caller: Patient Reason for Call: Talk to Nurse Summary of Call: FYI:  Patient was to call if she had gotten her flu shot.  Flu shot taken Sept. 22 @ Dr. Allyne Gee office. Initial call taken by: Lehman Prom,  May 01, 2010 2:06 PM  Follow-up for Phone Call        Called, spoke with pt.  Message verified.  Flu vac date entered in immunizations.   Gweneth Dimitri RN  May 01, 2010 3:30 PM       Immunization History:  Influenza Immunization History:    Influenza:  historical (02/14/2010)

## 2010-06-25 NOTE — Assessment & Plan Note (Signed)
Summary: Pulmonary OV   Copy to:  Dorothyann Peng Primary Provider/Referring Provider:  Dr. Dorothyann Peng  CC:  HFU.  Pt states breathing is doing good overall.  States she does cough "a little bit."  Cough is prod with "bubbly clear" mucus. Occ has "chest heaviness." denies wheezing and chest tightness.  [name]  History of Present Illness:  60  yo woman with severe COPD Golds Stage IV    September 18, 2009 9:42 AM No issues. and down to 2.5mg /d pred.  No real cough.  Occ croupy cough.  Hoarseness is still present. Dyspnea is at baseline.  No mucous except in early am.   overall this patient is stable compared to prior visits  Oct 09, 2009--Presents for an acute office visit. Complains of  1 week of cough, severe at time, keeping up at night, ribs sore from coughing. She has been doing very well w/ no recent flare for >6 months. She is on Prednisone 2.5mg  once daily. Cough is mainly dry w/ occasion yellow mucus. Denies chest pain, orthopnea, hemoptysis, fever, n/v/d, edema, headache. No recent abx or travel. OTC not helping.   November 20, 2009 3:22 PM [facility] 5/17- 5/22  with copd exac.  Now is better. Here with all meds in a bag and med list.  The list correlates perfectly with meds in bag. The pt is now better.  SHe will feel tight on occasion. There is no real cough.  There is no mucous . Pt denies any significant sore throat, nasal congestion or excess secretions, fever, chills, sweats, unintended weight loss, pleurtic or exertional chest pain, orthopnea PND, or leg swelling Pt denies any increase in rescue therapy over baseline, denies waking up needing it or having any early am or nocturnal exacerbations of coughing/wheezing/or dyspnea.   Preventive Screening-Counseling & Management  Alcohol-Tobacco     Smoking Status: quit > 6 months  Current Medications (verified): 1)  Furosemide 20 Mg  Tabs (Furosemide) .... Take One By Mouth Two Times A Day As Needed 2)  Prednisone 5 Mg  Tabs (Prednisone)  .... 1/2 Tablet Daily 3)  Cardizem Cd 180 Mg  Cp24 (Diltiazem Hcl Coated Beads) .... One By Mouth Three Times A Day 4)  Omeprazole 40 Mg Cpdr (Omeprazole) .[name].. 1 By Mouth Two Times A Day 5)  Proventil Hfa 108 (90 Base) Mcg/act  Aers (Albuterol Sulfate) .[name].. 1-2 Puffs Every 4-6 Hours As Needed 6)  Reclast 5 Mg/147ml  Soln (Zoledronic Acid) .... Iv Infusion Yearly 7)  Vitamin D 1000 Unit Tabs (Cholecalciferol) .... 2 Tabs Once Daily 8)  Gabapentin 300 Mg Caps (Gabapentin) .[name].. 1 By Mouth Three Times A Day As Needed 9)  Crestor 10 Mg Tabs (Rosuvastatin Calcium) .[name].. 1 By Mouth At Bedtime 10)  Lunesta 2 Mg Tabs (Eszopiclone) .[name].. 1 By Mouth At Bedtime 11)  Bisoprolol Fumarate 5 Mg Tabs (Bisoprolol Fumarate) .... Once Daily 12)  Mobic 7.5 Mg Tabs (Meloxicam) .... As Needed 13)  Hydrocodone-Acetaminophen 5-500 Mg  Tabs (Hydrocodone-Acetaminophen) .[name].. 1 To 2 By Mouth Every 6 Hour As Needed Pain 14)  Victoza 18 Mg/67ml Soln (Liraglutide) .... Once Daily As Directed 15)  Vitamin E 400 Unit Caps (Vitamin E) .... Take 1 Capsule By Mouth Once A Day 16)  Multivitamins   Tabs (Multiple Vitamin) .... Take 1 Tablet By Mouth Once A Day 17)  Magnesium .... Take 1 Tablet By Mouth Once A Day 18)  Vitamin C 1000 Mg Tabs (Ascorbic Acid) .... Take 2 Tablet By  Mouth Once A Day 19)  Spiriva Handihaler 18 Mcg  Caps (Tiotropium Bromide Monohydrate) .... Two Puffs in Handihaler Daily 20)  Accolate 20 Mg Tabs (Zafirlukast) .... One By Mouth Two Times A Day 21)  Cipro 750 Mg Tabs (Ciprofloxacin Hcl) .[name].. 1 By Mouth Two Times A Day 22)  Citalopram Hydrobromide 20 Mg Tabs (Citalopram Hydrobromide) .... Once Daily 23)  Flutter Valve .... Qid 24)  Mucinex 600 Mg Xr12h-Tab (Guaifenesin) .[name].. 1 in Am, 2 At Noon,  1 in Pm 25)  Oxygen .... 1.5-2 Liters 26)  Lorazepam 0.5 Mg Tabs (Lorazepam) .... As Needed  Allergies (verified): 1)  ! * Nexium  Past History:  Past medical, surgical, family and social histories (including  risk factors) reviewed, and no changes noted (except as noted below).  Past Medical History: Reviewed history from 07/11/2009 and no changes required. 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 Diabetes, Type 2     -diet controlled  Past Surgical History: Reviewed history from 12/15/2007 and no changes required. 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 non contrib  Social History: Reviewed history from 06/02/2007 and no changes required. Patient states former smoker.   Review of Systems       The patient complains of shortness of breath with activity and non-productive cough.  The patient denies shortness of breath at rest, productive cough, coughing up blood, chest pain, irregular heartbeats, acid heartburn, indigestion, loss of appetite, weight change, abdominal pain, difficulty swallowing, sore throat, tooth/dental problems, headaches, nasal congestion/difficulty breathing through nose, sneezing, itching, ear ache, anxiety, depression, hand/feet swelling, joint stiffness or pain, rash, change in color of mucus, and fever.    Vital Signs:  Patient profile:   60 year old female Height:      62 inches Weight:      146 pounds BMI:     26.80 O2 Sat:      95 % on 1.5 L/minpulsed Temp:     98.3 degrees F oral Pulse rate:   103 / minute BP sitting:   108 / 68  (right arm) Cuff size:   regular  Vitals Entered By: Gweneth Dimitri RN (November 20, 2009 3:05 PM)  O2 Flow:  1.5 L/minpulsed CC: HFU.  Pt states breathing is doing good overall.  States she  does cough "a little bit."  Cough is prod with "bubbly clear" mucus. Occ has "chest heaviness." denies wheezing and chest tightness.   Comments Medications reviewed with patient Daytime contact number verified with patient. Gweneth Dimitri RN  November 20, 2009 3:06 PM    Physical Exam  Additional Exam:  Gen: Pleasant, well-nourished, in no distress , normal affect, cushingoid facies ENT: no lesions, no post nasal drip, trach site well healed,  orophyx clear Neck: No JVD, no TMG, no carotid bruits Lungs: coarse BS w/ faint exp wheeze, upper airway psuedowheeze  Cardiovascular: RRR, heart sounds normal, no murmurs or gallops, no peripheral edema Abdomen: soft and non-tender, no HSM, BS normal Musculoskeletal: No deformities, no cyanosis or clubbing Neuro: alert, non-focal     Impression & Recommendations:  Problem # 1:  COPD (ICD-496) Assessment Improved Severe  copd, assoc vocal cord disorder, prior intubations, pneumonias and chronic resp failure, Golds stage IV plan No change in inhaled medications.   Maintain treatment program as currently prescribed.  Medications Added to Medication List This Visit: 1)  Furosemide 20 Mg Tabs (Furosemide) .... Take one by mouth two times a day as needed 2)  Bisoprolol Fumarate 5 Mg Tabs (Bisoprolol fumarate) .... Once daily 3)  Citalopram Hydrobromide 20 Mg Tabs (Citalopram hydrobromide) .... Once daily 4)  Flutter Valve  .... Qid 5)  Mucinex 600 Mg Xr12h-tab (Guaifenesin) .[name].. 1 in am, 2 at noon,  1 in pm 6)  Oxygen  .... 1.5-2 liters 7)  Lorazepam 0.5 Mg Tabs (Lorazepam) .... As needed  Complete Medication List: 1)  Furosemide 20 Mg Tabs (Furosemide) .... Take one by mouth two times a day as needed 2)  Prednisone 5 Mg Tabs (Prednisone) .... 1/2 tablet daily 3)  Cardizem Cd 180 Mg Cp24 (Diltiazem hcl coated beads) .... One by mouth three times a day 4)  Omeprazole 40 Mg Cpdr (Omeprazole) .[name].. 1 by mouth two times a day 5)  Proventil Hfa 108  (90 Base) Mcg/act Aers (Albuterol sulfate) .[name].. 1-2 puffs every 4-6 hours as needed 6)  Reclast 5 Mg/171ml Soln (Zoledronic acid) .... Iv infusion yearly 7)  Vitamin D 1000 Unit Tabs (Cholecalciferol) .... 2 tabs once daily 8)  Gabapentin 300 Mg Caps (Gabapentin) .[name].. 1 by mouth three times a day as needed 9)  Crestor 10 Mg Tabs (Rosuvastatin calcium) .[name].. 1 by mouth at bedtime 10)  Lunesta 2 Mg Tabs (Eszopiclone) .[name].. 1 by mouth at bedtime 11)  Bisoprolol Fumarate 5 Mg Tabs (Bisoprolol fumarate) .... Once daily 12)  Mobic 7.5 Mg Tabs (Meloxicam) .... As needed 13)  Hydrocodone-acetaminophen 5-500 Mg Tabs (Hydrocodone-acetaminophen) .[name].. 1 to 2 by mouth every 6 hour as needed pain 14)  Victoza 18 Mg/66ml Soln (Liraglutide) .... Once daily as directed 15)  Vitamin E 400 Unit Caps (Vitamin e) .... Take 1 capsule by mouth once a day 16)  Multivitamins Tabs (Multiple vitamin) .... Take 1 tablet by mouth once a day 17)  Magnesium  .... Take 1 tablet by mouth once a day 18)  Vitamin C 1000 Mg Tabs (Ascorbic acid) .... Take 2 tablet by mouth once a day 19)  Spiriva Handihaler 18 Mcg Caps (Tiotropium bromide monohydrate) .... Two puffs in handihaler daily 20)  Accolate 20 Mg Tabs (Zafirlukast) .... One by mouth two times a day 21)  Citalopram Hydrobromide 20 Mg Tabs (Citalopram hydrobromide) .... Once daily 22)  Flutter Valve  .... Qid 23)  Mucinex 600 Mg Xr12h-tab (Guaifenesin) .[name].. 1 in am, 2 at noon,  1 in pm 24)  Oxygen  .... 1.5-2 liters 25)  Lorazepam 0.5 Mg Tabs (Lorazepam) .... As needed  Other Orders: Est. Patient Level IV (16109)  Patient Instructions: 1)  No change in medications 2)  Return in     2     months

## 2010-06-25 NOTE — Progress Notes (Signed)
Summary: post hosp sched  ---- Converted from flag ---- ---- 11/08/2009 9:03 AM, Nyoka Cowden MD wrote:  this is wright's pt - when she comes back to see him she needs to bring all meds in with her but see Tammy with all meds in meantime if any worsening of her breathing.  convert this to a documented phone note after you talk to her. ------------------------------  Spoke with pt and sched followup with PW for 11/28/09 at 9 am.  Advised that pt needs to bring all meds to appt and followup here with TP sooner if needed.  Pt verbalized understanding.

## 2010-06-25 NOTE — Letter (Signed)
Summary: Handicapped Placard/[facility]  Handicapped Placard/[facility]   Imported By: Sherian Rein 12/06/2009 14:09:29  _____________________________________________________________________  External Attachment:    Type:   Image     Comment:   External Document

## 2010-06-25 NOTE — Assessment & Plan Note (Signed)
Summary: Pulmonary OV   Visit Type:  Follow-up Copy to:  Dorothyann Peng Primary Provider/Referring Provider:  Dr. Dorothyann Peng  CC:  Pt here for 2 month follow up. Pt states breathing is unchanged since last OV in Feb but feels breathing is "better" since she only has to take 2.5mg  of Prednisone.  History of Present Illness:  60  yo woman with severe COPD Golds Stage IV   February 26, 2009 9:00 AM No changes made 7/10 Doing ok now. sl croupy cough.  No mucous,  just dry.  No chest pain.  No real wheeze.   Pt denies any significant sore throat, nasal congestion or excess secretions, fever, chills, sweats, unintended weight loss, pleurtic or exertional chest pain, orthopnea PND, or leg swelling  May 01, 2009 9:23 AM No changes made at last ov.  No flareups since 10/10.  No mucous (rarely)  still croupy cough.  No chest pain just occ chest tightness.   Pt denies any significant sore throat, nasal congestion or excess secretions, fever, chills, sweats, unintended weight loss, pleurtic or exertional chest pain, orthopnea PND, or leg swelling.   No at bedtime dyspnea.   Has some nasal mucous buildup.  July 11, 2009 9:50 AM Doing ok.  Had a sl viral syndrome one week ago.  URI with this .  Took Vit C and fluids and did ok. Now:  occ croupy cough at night.  Still hoarse.  ENT appt was rescheduled.  Dyspnea is ok and paces self.    September 18, 2009 9:42 AM No issues. and down to 2.5mg /d pred.  No real cough.  Occ croupy cough.  Hoarseness is still present. Dyspnea is at baseline.  No mucous except in early am.   overall this patient is stable compared to prior visits  Preventive Screening-Counseling & Management  Alcohol-Tobacco     Smoking Status: quit > 6 months  Current Medications (verified): 1)  Furosemide 20 Mg  Tabs (Furosemide) .... Take 1 Tablet By Mouth Once A Day and 1 Additional As Needed For Swelling 2)  Prednisone 5 Mg  Tabs (Prednisone) .... 1/2 Tablet Daily 3)   Cardizem Cd 180 Mg  Cp24 (Diltiazem Hcl Coated Beads) .... One By Mouth Three Times A Day 4)  Omeprazole 40 Mg Cpdr (Omeprazole) .[name].. 1 By Mouth Two Times A Day 5)  Proventil Hfa 108 (90 Base) Mcg/act  Aers (Albuterol Sulfate) .[name].. 1-2 Puffs Every 4-6 Hours As Needed 6)  Reclast 5 Mg/141ml  Soln (Zoledronic Acid) .... Iv Infusion Yearly 7)  Vitamin D 1000 Unit Tabs (Cholecalciferol) .... 2 Tabs Once Daily 8)  Gabapentin 300 Mg Caps (Gabapentin) .[name].. 1 By Mouth Three Times A Day As Needed 9)  Crestor 10 Mg Tabs (Rosuvastatin Calcium) .[name].. 1 By Mouth At Bedtime 10)  Lunesta 2 Mg Tabs (Eszopiclone) .[name].. 1 By Mouth At Bedtime 11)  Nadolol 20 Mg Tabs (Nadolol) .... Once Daily and Take 2nd Tab As Needed 12)  Mobic 7.5 Mg Tabs (Meloxicam) .... As Needed 13)  Hydrocodone-Acetaminophen 5-500 Mg  Tabs (Hydrocodone-Acetaminophen) .[name].. 1 To 2 By Mouth Every 6 Hour As Needed Pain 14)  Victoza 18 Mg/6ml Soln (Liraglutide) .... Once Daily As Directed 15)  Vitamin E 400 Unit Caps (Vitamin E) .... Take 1 Capsule By Mouth Once A Day 16)  Multivitamins   Tabs (Multiple Vitamin) .... Take 1 Tablet By Mouth Once A Day 17)  Magnesium .... Take 1 Tablet By Mouth Once A Day 18)  Vitamin C 1000 Mg Tabs (Ascorbic Acid) .... Take 2 Tablet By Mouth Once A Day 19)  Spiriva Handihaler 18 Mcg  Caps (Tiotropium Bromide Monohydrate) .... Two Puffs in Handihaler Daily 20)  Accolate 20 Mg Tabs (Zafirlukast) .... One By Mouth Two Times A Day  Allergies (verified): 1)  ! * Nexium  Past History:  Past medical, surgical, family and social histories (including risk factors) reviewed, and no changes noted (except as noted below).  Past Medical History: Reviewed history from 07/11/2009 and no changes required. 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 Diabetes, Type 2     -diet controlled  Past Surgical History: Reviewed history from 12/15/2007 and no changes required. 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 non contrib  Social History: Reviewed history from 06/02/2007 and no changes required. Patient states former smoker.   Review of Systems       The patient complains of shortness of breath with activity and non-productive cough.  The patient denies shortness of breath at rest, productive cough, coughing up blood, chest pain, irregular heartbeats, acid heartburn, indigestion, loss of appetite, weight change, abdominal pain, difficulty swallowing, sore throat, tooth/dental problems, headaches, nasal congestion/difficulty breathing through nose, sneezing, itching, ear ache, anxiety, depression, hand/feet swelling, joint stiffness or pain, rash, change in color of mucus, and fever.    Vital Signs:  Patient profile:   60 year old female Height:      62 inches Weight:      151.13 pounds O2 Sat:      95 % on 2 L/min Temp:     98.3 degrees F oral Pulse rate:   99 / minute BP sitting:   126 / 86  (right arm) Cuff size:   regular  Vitals Entered By: Zackery Barefoot CMA (September 18, 2009 9:27 AM)  O2 Flow:  2 L/min CC: Pt here for 2 month follow up. Pt states breathing is unchanged since last OV in Feb but feels breathing is "better" since she only has to take 2.5mg  of Prednisone Comments Medications reviewed with patient  Verified contact number and pharmacy with patient  Zackery Barefoot CMA  September 18, 2009 9:28 AM    Physical Exam  Additional Exam:  Gen: Pleasant, well-nourished, in no distress , normal affect, cushingoid facies ENT: no lesions, no post nasal drip,  trach site well healed,  orophyx clear Neck: No JVD, no TMG, no carotid bruits Lungs: No use of accessory muscles, no dullness to percussion, distant BS, Cardiovascular: RRR, heart sounds normal, no murmurs or gallops, no peripheral edema Abdomen: soft and non-tender, no HSM, BS normal Musculoskeletal: No deformities, no cyanosis or clubbing Neuro: alert, non-focal     Impression & Recommendations:  Problem # 1:  COPD (ICD-496) Assessment Unchanged  Golds stage IV  COPD oxygen dependent  stable at this time  plan: No change in medications rov 2-3 months  Medications Added to Medication List This Visit: 1)  Furosemide 20 Mg Tabs (Furosemide) .... Take 1 tablet by mouth once a day and 1 additional as needed for swelling 2)  Furosemide 20 Mg Tabs (Furosemide) .... Take one by  mouth two times a day 3)  Vitamin E 400 Unit Caps (Vitamin e) .... Take 1 capsule by mouth once a day 4)  Multivitamins Tabs (Multiple vitamin) .... Take 1 tablet by mouth once a day 5)  Magnesium  .... Take 1 tablet by mouth once a day 6)  Vitamin C 1000 Mg Tabs (Ascorbic acid) .... Take 2 tablet by mouth once a day 7)  Spiriva Handihaler 18 Mcg Caps (Tiotropium bromide monohydrate) .... Two puffs in handihaler daily 8)  Accolate 20 Mg Tabs (Zafirlukast) .... One by mouth two times a day  Complete Medication List: 1)  Furosemide 20 Mg Tabs (Furosemide) .... Take one by mouth two times a day 2)  Prednisone 5 Mg Tabs (Prednisone) .... 1/2 tablet daily 3)  Cardizem Cd 180 Mg Cp24 (Diltiazem hcl coated beads) .... One by mouth three times a day 4)  Omeprazole 40 Mg Cpdr (Omeprazole) .[name].. 1 by mouth two times a day 5)  Proventil Hfa 108 (90 Base) Mcg/act Aers (Albuterol sulfate) .[name].. 1-2 puffs every 4-6 hours as needed 6)  Reclast 5 Mg/159ml Soln (Zoledronic acid) .... Iv infusion yearly 7)  Vitamin D 1000 Unit Tabs (Cholecalciferol) .... 2 tabs once daily 8)  Gabapentin 300 Mg Caps (Gabapentin) .[name].. 1 by mouth  three times a day as needed 9)  Crestor 10 Mg Tabs (Rosuvastatin calcium) .[name].. 1 by mouth at bedtime 10)  Lunesta 2 Mg Tabs (Eszopiclone) .[name].. 1 by mouth at bedtime 11)  Nadolol 20 Mg Tabs (Nadolol) .... Once daily and take 2nd tab as needed 12)  Mobic 7.5 Mg Tabs (Meloxicam) .... As needed 13)  Hydrocodone-acetaminophen 5-500 Mg Tabs (Hydrocodone-acetaminophen) .[name].. 1 to 2 by mouth every 6 hour as needed pain 14)  Victoza 18 Mg/33ml Soln (Liraglutide) .... Once daily as directed 15)  Vitamin E 400 Unit Caps (Vitamin e) .... Take 1 capsule by mouth once a day 16)  Multivitamins Tabs (Multiple vitamin) .... Take 1 tablet by mouth once a day 17)  Magnesium  .... Take 1 tablet by mouth once a day 18)  Vitamin C 1000 Mg Tabs (Ascorbic acid) .... Take 2 tablet by mouth once a day 19)  Spiriva Handihaler 18 Mcg Caps (Tiotropium bromide monohydrate) .... Two puffs in handihaler daily 20)  Accolate 20 Mg Tabs (Zafirlukast) .... One by mouth two times a day  Other Orders: Prescription Created Electronically [phone]) Est. Patient Level III (60454)  Patient Instructions: 1)  No change in medications 2)  Return 3 months  Prescriptions: FUROSEMIDE 20 MG  TABS (FUROSEMIDE) take one by mouth two times a day  #60 x 6   Entered and Authorized by:   Storm Frisk MD   Signed by:   Storm Frisk MD on 09/18/2009   Method used:   Electronically to        [facility] [phone]* (retail)       [address] [place], [place]  [address]       Ph: [phone] or [phone]       Fax: [phone]   RxID:   [id number]

## 2010-06-25 NOTE — Assessment & Plan Note (Signed)
Summary: Pulmonary OV   Copy to:  Dorothyann Peng Primary Provider/Referring Provider:  Dr. Dorothyann Peng  CC:  3 month follow up.  Pt states breathing is doing well.  SOB only when rushing and when walking flights of stairs.  Prod cough in the mornings with clear phelgm.  Denies wheezing and chest tightness. [name]  History of Present Illness:  60  yo woman with severe COPD Golds Stage IV  April 29, 2010 9:43 AM The pt is doing well since August,  She  visited cousin in [place].  She was able to go up and down flight of stairs.   No real cough except phlegm in the am. No chest pain.  Dyspnea is at baseline.  She does note was in hosp for 3days in [facility] Oct 2011.   No new issues.            Preventive Screening-Counseling & Management  Alcohol-Tobacco     Smoking Status: quit > 6 months  Current Medications (verified): 1)  Furosemide 20 Mg  Tabs (Furosemide) .... Take One By Mouth Two Times A Day As Needed 2)  Prednisone 5 Mg  Tabs (Prednisone) .... 1/2 Tablet Daily 3)  Cardizem Cd 180 Mg  Cp24 (Diltiazem Hcl Coated Beads) .... One By Mouth Three Times A Day 4)  Omeprazole 40 Mg Cpdr (Omeprazole) .[name].. 1 By Mouth Two Times A Day 5)  Proventil Hfa 108 (90 Base) Mcg/act  Aers (Albuterol Sulfate) .[name].. 1-2 Puffs Every 4-6 Hours As Needed 6)  Reclast 5 Mg/138ml  Soln (Zoledronic Acid) .... Iv Infusion Yearly 7)  Vitamin D 1000 Unit Tabs (Cholecalciferol) .... Take 1 Tablet By Mouth Once A Day 8)  Gabapentin 300 Mg Caps (Gabapentin) .[name].. 1 By Mouth Three Times A Day As Needed 9)  Crestor 10 Mg Tabs (Rosuvastatin Calcium) .[name].. 1 By Mouth At Bedtime 10)  Zolpidem Tartrate 6.25 Mg Cr-Tabs (Zolpidem Tartrate) .... 2 At Bedtime 11)  Bisoprolol Fumarate 5 Mg Tabs (Bisoprolol Fumarate) .... Take 1 Tablet By Mouth Two Times A Day 12)  Mobic 7.5 Mg Tabs (Meloxicam) .... As Needed 13)  Hydrocodone-Acetaminophen 5-500 Mg  Tabs (Hydrocodone-Acetaminophen) .[name].. 1 To 2 By Mouth Every 6 Hour As Needed  Pain 14)  Victoza 18 Mg/64ml Soln (Liraglutide) .... Once Daily As Directed 15)  Vitamin E 400 Unit Caps (Vitamin E) .... Take 1 Capsule By Mouth Once A Day 16)  Multivitamins   Tabs (Multiple Vitamin) .... Take 1 Tablet By Mouth Once A Day 17)  Vitamin C 1000 Mg Tabs (Ascorbic Acid) .... Take 2 Tablet By Mouth Once A Day As Needed 18)  Spiriva Handihaler 18 Mcg  Caps (Tiotropium Bromide Monohydrate) .... Two Puffs in Handihaler Daily 19)  Accolate 20 Mg Tabs (Zafirlukast) .... One By Mouth Two Times A Day 20)  Citalopram Hydrobromide 20 Mg Tabs (Citalopram Hydrobromide) .... Once Daily 21)  Flutter Valve .... Qid As Needed 22)  Mucinex 600 Mg Xr12h-Tab (Guaifenesin) .[name].. 1 Tablet Four Times Daily 23)  Oxygen .... 1.5-2 Liters 24)  Zafirlukast 20 Mg Tabs (Zafirlukast) .... Take 1 Tablet By Mouth Two Times A Day  Allergies (verified): 1)  ! * Nexium  Past History:  Past medical, surgical, family and social histories (including risk factors) reviewed, and no changes noted (except as noted below).  Past Medical History: Reviewed history from 07/11/2009 and no changes required. 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 Diabetes, Type 2     -diet controlled  Past Surgical History: Reviewed history from 12/15/2007 and no changes required. 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 non contrib  Social History: Reviewed history from 06/02/2007 and no changes required. Patient states former smoker.  Quit in 2005.  Up to 1 ppd x 35 yrs.  Review of  Systems       The patient complains of shortness of breath with activity and non-productive cough.  The patient denies shortness of breath at rest, productive cough, coughing up blood, chest pain, irregular heartbeats, acid heartburn, indigestion, loss of appetite, weight change, abdominal pain, difficulty swallowing, sore throat, tooth/dental problems, headaches, nasal congestion/difficulty breathing through nose, sneezing, itching, ear ache, anxiety, depression, hand/feet swelling, joint stiffness or pain, rash, change in color of mucus, and fever.    Vital Signs:  Patient profile:   60 year old female Height:      62 inches Weight:      130.50 pounds BMI:     23.96 O2 Sat:      94 % on 1.5 L/minpulsed Temp:     98.1 degrees F oral Pulse rate:   102 / minute BP sitting:   124 / 74  (left arm) Cuff size:   regular  Vitals Entered By: Gweneth Dimitri RN (April 29, 2010 9:28 AM)  O2 Flow:  1.5 L/minpulsed CC: 3 month follow up.  Pt states breathing is doing well.  SOB only when rushing and when walking flights of stairs.  Prod cough in the mornings with clear phelgm.  Denies wheezing and chest tightness.  Comments Medications reviewed with patient Daytime contact number verified with patient. Gweneth Dimitri RN  April 29, 2010 9:29 AM    Physical Exam  Additional Exam:  Gen: Pleasant, well-nourished, in no distress , normal affect, cushingoid facies ENT: no lesions, no post nasal drip, trach site well healed,  orophyx clear Neck: No JVD, no TMG, no carotid bruits Lungs: coarse BS w/ pseudowheeze is noted , distant BS noted. Cardiovascular: RRR, heart sounds normal, no murmurs or gallops, no peripheral edema Abdomen: soft and non-tender, no HSM, BS normal Musculoskeletal: No deformities, no cyanosis or clubbing Neuro: alert, non-focal     Impression & Recommendations:  Problem # 1:  COPD (ICD-496) Assessment Unchanged Severe copd with primary emphysema component plan No  change in inhaled medications.   Maintain treatment program as currently prescribed.  Medications Added to Medication List This Visit: 1)  Vitamin D 1000 Unit Tabs (Cholecalciferol) .... Take 1 tablet by mouth once a day 2)  Zolpidem Tartrate 6.25 Mg Cr-tabs (Zolpidem tartrate) .... 2 at bedtime 3)  Vitamin C 1000 Mg Tabs (Ascorbic acid) .... Take 2 tablet by mouth once a day as needed 4)  Flutter Valve  .... Qid as needed 5)  Mucinex 600 Mg Xr12h-tab (Guaifenesin) .[name].. 1 tablet four times daily 6)  Zafirlukast 20 Mg Tabs (Zafirlukast) .... Take 1 tablet by mouth two times a day  Complete Medication List: 1)  Furosemide 20 Mg Tabs (Furosemide) .... Take one by mouth two times a day as needed 2)  Prednisone 5 Mg Tabs (Prednisone) .... 1/2 tablet daily 3)  Cardizem Cd 180 Mg Cp24 (Diltiazem hcl coated beads) .... One by mouth three times a day 4)  Omeprazole 40 Mg Cpdr (Omeprazole) .[name].. 1 by mouth two times a day 5)  Proventil Hfa 108 (90 Base) Mcg/act Aers (Albuterol sulfate) .[name].. 1-2 puffs every 4-6 hours as needed 6)  Reclast 5 Mg/171ml Soln (Zoledronic acid) .... Iv infusion yearly 7)  Vitamin D 1000 Unit Tabs (Cholecalciferol) .... Take 1 tablet by mouth once a day 8)  Gabapentin 300 Mg Caps (Gabapentin) .[name].. 1 by mouth three times a day as needed 9)  Crestor 10 Mg Tabs (Rosuvastatin calcium) .[name].. 1 by mouth at bedtime 10)  Zolpidem Tartrate 6.25 Mg Cr-tabs (Zolpidem tartrate) .... 2 at bedtime 11)  Bisoprolol Fumarate 5 Mg Tabs (Bisoprolol fumarate) .... Take 1 tablet by mouth two times a day 12)  Mobic 7.5 Mg Tabs (Meloxicam) .... As needed 13)  Hydrocodone-acetaminophen 5-500 Mg Tabs (Hydrocodone-acetaminophen) .[name].. 1 to 2 by mouth every 6 hour as needed pain 14)  Victoza 18 Mg/11ml Soln (Liraglutide) .... Once daily as directed 15)  Vitamin E 400 Unit Caps (Vitamin e) .... Take 1 capsule by mouth once a day 16)  Multivitamins Tabs (Multiple vitamin) .... Take 1 tablet by mouth once a  day 17)  Vitamin C 1000 Mg Tabs (Ascorbic acid) .... Take 2 tablet by mouth once a day as needed 18)  Spiriva Handihaler 18 Mcg Caps (Tiotropium bromide monohydrate) .... Two puffs in handihaler daily 19)  Accolate 20 Mg Tabs (Zafirlukast) .... One by mouth two times a day 20)  Citalopram Hydrobromide 20 Mg Tabs (Citalopram hydrobromide) .... Once daily 21)  Flutter Valve  .... Qid as needed 22)  Mucinex 600 Mg Xr12h-tab (Guaifenesin) .[name].. 1 tablet four times daily 23)  Oxygen  .... 1.5-2 liters 24)  Zafirlukast 20 Mg Tabs (Zafirlukast) .... Take 1 tablet by mouth two times a day  Other Orders: Est. Patient Level III (16109)  Patient Instructions: 1)  No change in medications 2)  Return in    4      months Prescriptions: PROVENTIL HFA 108 (90 BASE) MCG/ACT  AERS (ALBUTEROL SULFATE) 1-2 puffs every 4-6 hours as needed  #1 x 6   Entered and Authorized by:   Storm Frisk MD   Signed by:   Storm Frisk MD on 04/29/2010   Method used:   Electronically to        [facility] [phone]* (retail)       [address] [place], [place]  [address]       Ph: [phone] or [phone]       Fax: [phone]   RxID:   [id number]     Appended Document: Pulmonary OV fax robyn sanders

## 2010-06-25 NOTE — Progress Notes (Signed)
Summary: med list  Phone Note Call from Patient Call back at Home Phone [phone]   Caller: Patient Call For: wright Summary of Call: pt came by office. states she received msg per dr wert re: bringing in all her meds when she sees dr [name] in july for f/u visit. pt says she is "back to her normal self". says that dr [name] "likes her meds list" as she keeps it well and doesn't feel she needs to bring all meds in. please advise per pt request. (pt just now leaving office so give it some time before calling back).  Initial call taken by: Tivis Ringer, CNA,  November 15, 2009 10:44 AM  Follow-up for Phone Call        Please advise Dr. [name] if you are ok if pt just brings in her updated current medlist or if you want her to bring in all her meds as advised by Dr. [name].  Thanks.  Aundra Millet Reynolds LPN  November 15, 2009 1:22 PM   Additional Follow-up for Phone Call Additional follow up Details #1::        please bring in med list and all meds in a bag for me to review and double check at next OV Additional Follow-up by: Storm Frisk MD,  November 15, 2009 4:39 PM    Additional Follow-up for Phone Call Additional follow up Details #2::    Spoke with pt.  Pt informed of above statement per PW.  She verbalized understanding.  Gweneth Dimitri RN  November 15, 2009 4:58 PM

## 2010-06-25 NOTE — Progress Notes (Signed)
Summary: FYI   Phone Note Call from Patient Call back at [facility] Phone [phone]   Caller: Patient Call For: wright Reason for Call: Talk to Nurse Summary of Call: FYI: The patient told Crystal she would call and give the name of eye drops she was taking, it is azasite 1%, states she takes once a day and her last day will be Thursday. Initial call taken by: Darletta Moll,  January 14, 2010 3:29 PM  Follow-up for Phone Call        ATC pt at above number. Unable to leave message.  Received recording stating VM has not been set up yet.  WCB Crystal Jones RN  January 14, 2010 4:05 PM  Spoke with pt.  Pt states she told nurse she would call back to give the name of the eye gtt she is on -- azasite 1% once daily, will finish it on Thursday, Jan 17, 2010.  Medication added to pt's med list.   Follow-up by: Gweneth Dimitri RN,  January 15, 2010 4:11 PM    New/Updated Medications: AZASITE 1 % SOLN (AZITHROMYCIN) once daily as directed

## 2010-06-25 NOTE — Progress Notes (Signed)
Summary: handicapp form  Phone Note Call from Patient   Caller: Patient Call For: wright Summary of Call: need handicapp form filled out mail to pt when done Initial call taken by: Rickard Patience,  December 04, 2009 9:33 AM  Follow-up for Phone Call        Form signed by Dr. [name]. Original mailed to pt's home address and a copy put in Dr. Lynelle Doctor scan folder to be scanned into pt's chart. Follow-up by: Michel Bickers CMA,  December 04, 2009 9:48 AM

## 2010-06-25 NOTE — Progress Notes (Signed)
Summary: Declined med rec ov with Tammy NP strongly rec by [name]  Phone Note Call from Patient Call back at Home Phone [phone]   Caller: Patient Call For: wright Reason for Call: Talk to Nurse Summary of Call: Wants to speak with nurse, re: hfu. Initial call taken by: Darletta Moll,  October 30, 2009 1:34 PM  Follow-up for Phone Call        Pt states she was admitted to hospital after seeing TP on 10-09-09 and does not want to set a HFU appt. She has an upcoming appt with PW in July and wants to know if she can wait until then. She staets she is feeling very good and doesn not want to have an extra appt. I advised if she did not want to set hfu that washer choice, but to be sure to call us if any problems arise between now and upcoming appt.   Pt also asked about her application for [employer] assitance. She states she completed a new form or enrollement but has not heard anything fromthe company yet. I called the company and they have not received new enrollment form, all I see in EMR is rx reorder form. Libby, please advise. Thanks.Carron Curie CMA  October 30, 2009 2:41 PM   Additional Follow-up for Phone Call Additional follow up Details #1::        refaxed paperwork for the [employer] program  Additional Follow-up by: Oneita Jolly,  October 31, 2009 11:45 AM

## 2010-06-25 NOTE — Assessment & Plan Note (Signed)
Summary: Acute NP office visit - COPD, bronchitis   Copy to:  Dorothyann Peng Primary Provider/Referring Provider:  Dr. Dorothyann Peng  CC:  1 week of cough, severe at time, keeping up at night, and ribs sore from coughing. [name]  History of Present Illness:  60  yo woman with severe COPD Golds Stage IV  February 26, 2009 9:00 AM No changes made 7/10 Doing ok now. sl croupy cough.  No mucous,  just dry.  No chest pain.  No real wheeze.     May 01, 2009 9:23 AM No changes made at last ov.  No flareups since 10/10.  No mucous (rarely)  still croupy cough.  No chest pain just occ chest tightness.       Has some nasal mucous buildup.  July 11, 2009 9:50 AM Doing ok.  Had a sl viral syndrome one week ago.  URI with this .  Took Vit C and fluids and did ok. Now:  occ croupy cough at night.  Still hoarse.  ENT appt was rescheduled.  Dyspnea is ok and paces self.    September 18, 2009 9:42 AM No issues. and down to 2.5mg /d pred.  No real cough.  Occ croupy cough.  Hoarseness is still present. Dyspnea is at baseline.  No mucous except in early am.   overall this patient is stable compared to prior visits  Oct 09, 2009--Presents for an acute office visit. Complains of  1 week of cough, severe at time, keeping up at night, ribs sore from coughing. She has been doing very well w/ no recent flare for >6 months. She is on Prednisone 2.5mg  once daily. Cough is mainly dry w/ occasion yellow mucus. Denies chest pain, orthopnea, hemoptysis, fever, n/v/d, edema, headache. No recent abx or travel. OTC not helping.   Medications Prior to Update: 1)  Furosemide 20 Mg  Tabs (Furosemide) .... Take One By Mouth Two Times A Day 2)  Prednisone 5 Mg  Tabs (Prednisone) .... 1/2 Tablet Daily 3)  Cardizem Cd 180 Mg  Cp24 (Diltiazem Hcl Coated Beads) .... One By Mouth Three Times A Day 4)  Omeprazole 40 Mg Cpdr (Omeprazole) .[name].. 1 By Mouth Two Times A Day 5)  Proventil Hfa 108 (90 Base) Mcg/act  Aers (Albuterol  Sulfate) .[name].. 1-2 Puffs Every 4-6 Hours As Needed 6)  Reclast 5 Mg/11ml  Soln (Zoledronic Acid) .... Iv Infusion Yearly 7)  Vitamin D 1000 Unit Tabs (Cholecalciferol) .... 2 Tabs Once Daily 8)  Gabapentin 300 Mg Caps (Gabapentin) .[name].. 1 By Mouth Three Times A Day As Needed 9)  Crestor 10 Mg Tabs (Rosuvastatin Calcium) .[name].. 1 By Mouth At Bedtime 10)  Lunesta 2 Mg Tabs (Eszopiclone) .[name].. 1 By Mouth At Bedtime 11)  Nadolol 20 Mg Tabs (Nadolol) .... Once Daily and Take 2nd Tab As Needed 12)  Mobic 7.5 Mg Tabs (Meloxicam) .... As Needed 13)  Hydrocodone-Acetaminophen 5-500 Mg  Tabs (Hydrocodone-Acetaminophen) .[name].. 1 To 2 By Mouth Every 6 Hour As Needed Pain 14)  Victoza 18 Mg/74ml Soln (Liraglutide) .... Once Daily As Directed 15)  Vitamin E 400 Unit Caps (Vitamin E) .... Take 1 Capsule By Mouth Once A Day 16)  Multivitamins   Tabs (Multiple Vitamin) .... Take 1 Tablet By Mouth Once A Day 17)  Magnesium .... Take 1 Tablet By Mouth Once A Day 18)  Vitamin C 1000 Mg Tabs (Ascorbic Acid) .... Take 2 Tablet By Mouth Once A Day 19)  Spiriva Handihaler  18 Mcg  Caps (Tiotropium Bromide Monohydrate) .... Two Puffs in Handihaler Daily 20)  Accolate 20 Mg Tabs (Zafirlukast) .... One By Mouth Two Times A Day  Current Medications (verified): 1)  Furosemide 20 Mg  Tabs (Furosemide) .... Take One By Mouth Two Times A Day 2)  Prednisone 5 Mg  Tabs (Prednisone) .... 1/2 Tablet Daily 3)  Cardizem Cd 180 Mg  Cp24 (Diltiazem Hcl Coated Beads) .... One By Mouth Three Times A Day 4)  Omeprazole 40 Mg Cpdr (Omeprazole) .[name].. 1 By Mouth Two Times A Day 5)  Proventil Hfa 108 (90 Base) Mcg/act  Aers (Albuterol Sulfate) .[name].. 1-2 Puffs Every 4-6 Hours As Needed 6)  Reclast 5 Mg/135ml  Soln (Zoledronic Acid) .... Iv Infusion Yearly 7)  Vitamin D 1000 Unit Tabs (Cholecalciferol) .... 2 Tabs Once Daily 8)  Gabapentin 300 Mg Caps (Gabapentin) .[name].. 1 By Mouth Three Times A Day As Needed 9)  Crestor 10 Mg Tabs (Rosuvastatin  Calcium) .[name].. 1 By Mouth At Bedtime 10)  Lunesta 2 Mg Tabs (Eszopiclone) .[name].. 1 By Mouth At Bedtime 11)  Nadolol 20 Mg Tabs (Nadolol) .... Once Daily and Take 2nd Tab As Needed 12)  Mobic 7.5 Mg Tabs (Meloxicam) .... As Needed 13)  Hydrocodone-Acetaminophen 5-500 Mg  Tabs (Hydrocodone-Acetaminophen) .[name].. 1 To 2 By Mouth Every 6 Hour As Needed Pain 14)  Victoza 18 Mg/79ml Soln (Liraglutide) .... Once Daily As Directed 15)  Vitamin E 400 Unit Caps (Vitamin E) .... Take 1 Capsule By Mouth Once A Day 16)  Multivitamins   Tabs (Multiple Vitamin) .... Take 1 Tablet By Mouth Once A Day 17)  Magnesium .... Take 1 Tablet By Mouth Once A Day 18)  Vitamin C 1000 Mg Tabs (Ascorbic Acid) .... Take 2 Tablet By Mouth Once A Day 19)  Spiriva Handihaler 18 Mcg  Caps (Tiotropium Bromide Monohydrate) .... Two Puffs in Handihaler Daily 20)  Accolate 20 Mg Tabs (Zafirlukast) .... One By Mouth Two Times A Day  Allergies (verified): 1)  ! 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   Risk Factors: Smoking Status: quit > 6 months (09/18/2009)  Review of Systems      See HPI  Vital Signs:  Patient profile:   60 year old female Height:      62 inches Weight:      146 pounds BMI:     26.80 O2 Sat:      99 % on 3  L/min pulsing Temp:     97.7 degrees F oral Pulse rate:   90 / minute BP sitting:   112 / 62  (left arm) Cuff size:   regular  Vitals Entered By: Boone Master CNA (Oct 09, 2009 3:26 PM)  O2 Flow:  3 L/min pulsing CC: 1 week of cough, severe at time, keeping up at night, ribs sore from coughing.  Is Patient Diabetic? No  Comments Medications reviewed with patient Daytime contact number verified with patient. Boone Master CNA  Oct 09, 2009 3:27 PM    Physical Exam  Additional Exam:  Gen: Pleasant, well-nourished, in no distress , normal affect, cushingoid facies ENT: no lesions, no post nasal drip, trach site well healed,  orophyx clear Neck: No JVD, no TMG, no carotid bruits Lungs: coarse BS w/ faint exp wheeze, upper airway psuedowheeze  Cardiovascular: RRR, heart sounds normal, no murmurs or gallops, no peripheral edema Abdomen: soft and non-tender, no HSM, BS normal Musculoskeletal: No deformities, no cyanosis or clubbing Neuro: alert, non-focal     Impression & Recommendations:  Problem # 1:  COPD (ICD-496)  Exacerbation  We discussed prednisone adjustment however she refuses to have this increased She did agree to a Depomedrol shot today.  REC: xopenex neb in office.  Cipro 750mg  two times a day for 7days, w/ food (she has hx of psuedomonas in past. ) Mucinex DM two times a day as needed  Increase fluids.  Please contact office for sooner follow up if symptoms do not improve or worsen  follow up 2 weeks Dr. [name] and as needed   addendum:  pt went to car  w/ sister and started w/ increased wheezing and panic like symptoms  she was brought back to office by staff. Recommended admission to hospital several times however she refused completely.  Staff and sister present during exam.  She was given neb tx w/ improved aeration and calmed down and was smiling at end of exam. sats on arrival back to office were 94-96 % on O2. She was again offered admission prior to  leaving but refused. advised if she is not improving or worsens to go to ER or call 911.   Orders: Est. Patient Level IV (27253)  Medications Added to Medication List This Visit: 1)  Cipro 750 Mg Tabs (Ciprofloxacin hcl) .[name].. 1 by mouth two times a day  Complete Medication List: 1)  Furosemide 20 Mg Tabs (Furosemide) .... Take one by mouth two times a day 2)  Prednisone 5 Mg Tabs (Prednisone) .... 1/2 tablet daily 3)  Cardizem Cd 180 Mg Cp24 (Diltiazem hcl coated beads) .... One by mouth three times a day 4)  Omeprazole 40 Mg Cpdr (Omeprazole) .[name].. 1 by mouth two times a day 5)  Proventil Hfa 108 (90 Base) Mcg/act Aers (Albuterol sulfate) .[name].. 1-2 puffs every 4-6 hours as needed 6)  Reclast 5 Mg/130ml Soln (Zoledronic acid) .... Iv infusion yearly 7)  Vitamin D 1000 Unit Tabs (Cholecalciferol) .... 2 tabs once daily 8)  Gabapentin 300 Mg Caps (Gabapentin) .[name].. 1 by mouth three times a day as needed 9)  Crestor 10 Mg Tabs (Rosuvastatin calcium) .[name].. 1 by mouth at bedtime 10)  Lunesta 2 Mg Tabs (Eszopiclone) .[name].. 1 by mouth at bedtime 11)  Nadolol 20 Mg Tabs (Nadolol) .... Once daily and take 2nd tab as needed 12)  Mobic 7.5 Mg Tabs (Meloxicam) .... As needed 13)  Hydrocodone-acetaminophen 5-500 Mg Tabs (Hydrocodone-acetaminophen) .[name].. 1 to 2 by mouth every 6 hour as needed pain 14)  Victoza 18 Mg/27ml Soln (Liraglutide) .... Once daily as directed 15)  Vitamin E 400 Unit Caps (Vitamin e) .... Take 1 capsule by mouth once a day 16)  Multivitamins Tabs (Multiple vitamin) .... Take 1 tablet by mouth once a day 17)  Magnesium  .... Take 1 tablet by mouth once a day 18)  Vitamin C 1000 Mg Tabs (Ascorbic acid) .Marland KitchenMarland KitchenMarland Kitchen  Take 2 tablet by mouth once a day 19)  Spiriva Handihaler 18 Mcg Caps (Tiotropium bromide monohydrate) .... Two puffs in handihaler daily 20)  Accolate 20 Mg Tabs (Zafirlukast) .... One by mouth two times a day 21)  Cipro 750 Mg Tabs (Ciprofloxacin hcl) .[name].. 1 by mouth two times a  day  Other Orders: Admin of Therapeutic Inj  intramuscular or subcutaneous (62831) Depo- Medrol 40mg  (J1030) Depo- Medrol 80mg  (J1040) Nebulizer Tx (51761) Nebulizer Tx (60737)  Patient Instructions: 1)  Cipro 750mg  two times a day for 7days, w/ food 2)  Mucinex DM two times a day as needed  3)  Increase fluids.  4)  Please contact office for sooner follow up if symptoms do not improve or worsen  5)  follow up 2 weeks Dr. [name] and as needed  Prescriptions: CIPRO 750 MG TABS (CIPROFLOXACIN HCL) 1 by mouth two times a day  #14 x 0   Entered and Authorized by:   Rubye Oaks NP   Signed by:   Rubye Oaks NP on 10/09/2009   Method used:   Electronically to        [facility] [phone]* (retail)       [address] [place], [place]  [address]       Ph: [phone] or [phone]       Fax: [phone]   RxID:   [id number]    Medication Administration  Injection # 1:    Medication: Depo- Medrol 80mg     Diagnosis: ACUTE BRONCHITIS (ICD-466.0)    Route: SQ    Site: LUOQ gluteus    Exp Date: 03/2012    Lot #: [id number]    Mfr: Pharmacia    Patient tolerated injection without complications    Given by: Reynaldo Minium CMA (Oct 09, 2009 4:10 PM)  Injection # 2:    Medication: Depo- Medrol 40mg     Diagnosis: ACUTE BRONCHITIS (ICD-466.0)    Route: SQ    Site: LUOQ gluteus    Exp Date: 03/2012    Lot #: [id number]    Mfr: Pharmacia    Patient tolerated injection without complications    Given by: Reynaldo Minium CMA (Oct 09, 2009 4:11 PM)  Medication # 1:    Medication: Xopenex 1.25mg     Diagnosis: ACUTE BRONCHITIS (ICD-466.0)    Dose: 1 vial    Route: inhaled    Exp Date: 01/2010    Lot #: [id number]    Mfr: Sepracor    Patient tolerated medication without complications    Given by: Reynaldo Minium CMA (Oct 09, 2009 4:11 PM)  Orders Added: 1)  Admin of Therapeutic Inj  intramuscular or subcutaneous [96372] 2)  Depo- Medrol 40mg  [J1030] 3)  Depo- Medrol 80mg   [J1040] 4)  Nebulizer Tx [94640] 5)  Nebulizer Tx [15400] 6)  Est. Patient Level IV [86761]

## 2010-06-25 NOTE — Letter (Signed)
Summary: [facility]  [facility]   Imported By: Sherian Rein 07/25/2009 11:22:38  _____________________________________________________________________  External Attachment:    Type:   Image     Comment:   External Document

## 2010-06-25 NOTE — Medication Information (Signed)
Summary: [facility]  [facility]   Imported By: Sherian Rein 11/12/2009 13:36:15  _____________________________________________________________________  External Attachment:    Type:   Image     Comment:   External Document

## 2010-06-25 NOTE — Letter (Signed)
Summary: [facility]  [facility]   Imported By: Sherian Rein 06/23/2009 08:08:57  _____________________________________________________________________  External Attachment:    Type:   Image     Comment:   External Document

## 2010-06-26 ENCOUNTER — Telehealth (INDEPENDENT_AMBULATORY_CARE_PROVIDER_SITE_OTHER): Payer: Self-pay | Admitting: *Deleted

## 2010-06-27 NOTE — Progress Notes (Signed)
Summary: wheezing/cb  Phone Note Call from Patient Call back at Home Phone [phone]   Caller: Patient Call For: wright Summary of Call: pt is having tightness in her chest and wheezing [facility] Initial call taken by: Lacinda Axon,  May 30, 2010 12:04 PM  Follow-up for Phone Call        Pt c/o chest tightness, increased SOB with exertion, wheezing, productive cough with clear mucus worse in the mornings, arms and legs ache, lightheaded. Was seen by ortho due to right shoulder issues, left ankle issue. Pt states has enough Prednisone on hand if needed but does not want to increase, currently on Prednisone 0.5mg  once daily. Please advise. Thanks! Zackery Barefoot CMA  May 30, 2010 2:50 PM  [facility]  Additional Follow-up for Phone Call Additional follow up Details #1::        increase prednisone to 30mg  /d for 3 days then down by one every 3 days until back to 0.5mg  per day Rx zithromax 500mg  times one dose then 250mg  a day for 4 doses and stop OV if unimproving Additional Follow-up by: Storm Frisk MD,  May 30, 2010 3:18 PM    Additional Follow-up for Phone Call Additional follow up Details #2::    Rx for zithromax was sent.  Spoke with pt and notified of recs regarding prednisone.  Pt verbalized understanding. Follow-up by: Vernie Murders,  May 30, 2010 3:28 PM  New/Updated Medications: ZITHROMAX 250 MG TABS (AZITHROMYCIN) take 2 today, then 1 daily until gone Prescriptions: ZITHROMAX 250 MG TABS (AZITHROMYCIN) take 2 today, then 1 daily until gone  #6 x 0   Entered by:   Vernie Murders   Authorized by:   Storm Frisk MD   Signed by:   Vernie Murders on 05/30/2010   Method used:   Electronically to        [facility] [phone]* (retail)       [address] [place], [place]  [address]       Ph: [phone] or [phone]       Fax: [phone]   RxID:   [id number]

## 2010-06-27 NOTE — Assessment & Plan Note (Addendum)
Summary: Pulmonary OV   Copy to:  Dorothyann Peng Primary Provider/Referring Provider:  Dr. Dorothyann Peng  CC:  Follow up for surgery clearance.  Pt needs right total shoulder replacement.  States breathing is dong "really well."  Denies SOB, wheeizng, cehst tightness, and cough..  History of Present Illness:  60  yo woman with severe COPD Golds Stage IV  April 29, 2010 9:43 AM The pt is doing well since August,  She  visited cousin in [place].  She was able to go up and down flight of stairs.   No real cough except phlegm in the am. No chest pain.  Dyspnea is at baseline.  She does note was in hosp for 3days in [facility] Oct 2011.   No new issues.     June 13, 2010 3:27 PM Pt referred for surgical clearance. Pt needs R shoulder replacement.  She is very limited by sever lung disease, stage IV copd oxygen dependent  Preventive Screening-Counseling & Management  Alcohol-Tobacco     Smoking Status: quit > 6 months  Current Medications (verified): 1)  Furosemide 20 Mg  Tabs (Furosemide) .... Take One By Mouth Two Times A Day As Needed 2)  Prednisone 5 Mg  Tabs (Prednisone) .... 1/2 Tablet Daily 3)  Cardizem Cd 180 Mg  Cp24 (Diltiazem Hcl Coated Beads) .... One By Mouth Three Times A Day 4)  Omeprazole 40 Mg Cpdr (Omeprazole) .[name].. 1 By Mouth Two Times A Day 5)  Proventil Hfa 108 (90 Base) Mcg/act  Aers (Albuterol Sulfate) .[name].. 1-2 Puffs Every 4-6 Hours As Needed 6)  Reclast 5 Mg/134ml  Soln (Zoledronic Acid) .... Iv Infusion Yearly 7)  Vitamin D 1000 Unit Tabs (Cholecalciferol) .... Take 1 Tablet By Mouth Once A Day 8)  Crestor 10 Mg Tabs (Rosuvastatin Calcium) .[name].. 1 By Mouth At Bedtime 9)  Zolpidem Tartrate 6.25 Mg Cr-Tabs (Zolpidem Tartrate) .... 2 At Bedtime 10)  Bisoprolol Fumarate 5 Mg Tabs (Bisoprolol Fumarate) .... Take 1 Tablet By Mouth Once A Day 11)  Hydrocodone-Acetaminophen 5-500 Mg  Tabs (Hydrocodone-Acetaminophen) .[name].. 1 To 2 By Mouth Every 6 Hour As Needed Pain 12)  Victoza  18 Mg/52ml Soln (Liraglutide) .... Once Daily As Directed 13)  Vitamin E 400 Unit Caps (Vitamin E) .... Take 1 Capsule By Mouth Once A Day 14)  Multivitamins   Tabs (Multiple Vitamin) .... Take 1 Tablet By Mouth Once A Day 15)  Vitamin C 1000 Mg Tabs (Ascorbic Acid) .... Take 2 Tablet By Mouth Once A Day As Needed 16)  Spiriva Handihaler 18 Mcg  Caps (Tiotropium Bromide Monohydrate) .... Two Puffs in Handihaler Daily 17)  Accolate 20 Mg Tabs (Zafirlukast) .... One By Mouth Two Times A Day 18)  Citalopram Hydrobromide 20 Mg Tabs (Citalopram Hydrobromide) .... Once Daily 19)  Flutter Valve .... Qid As Needed 20)  Mucinex 600 Mg Xr12h-Tab (Guaifenesin) .[name].. 1 Tablet Four Times Daily 21)  Oxygen .... 1.5-2 Liters 22)  Magnesium 250 Mg Tabs (Magnesium) .... Take 1 Tablet By Mouth Once A Day 23)  Aspirin 81 Mg Tbec (Aspirin) .... As Needed 24)  Citracal Plus  Tabs (Multiple Minerals-Vitamins) .... Take 1 Tablet By Mouth Two Times A Day  Allergies (verified): 1)  ! * Nexium  Past History:  Past medical, surgical, family and social histories (including risk factors) reviewed, and no changes noted (except as noted below).  Past Medical History: Reviewed history from 07/11/2009 and no changes required. 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 Diabetes, Type 2     -diet controlled  Past Surgical History: Reviewed history from 12/15/2007 and no changes required. 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 non contrib  Social History: Reviewed history from  04/29/2010 and no changes required. Patient states former smoker.  Quit in 2005.  Up to 1 ppd x 35 yrs.  Review of Systems       The patient complains of shortness of breath with activity, shortness of breath at rest, and non-productive cough.  The patient denies productive cough, coughing up blood, chest pain, irregular heartbeats, acid heartburn, indigestion, loss of appetite, weight change, abdominal pain, difficulty swallowing, sore throat, tooth/dental problems, headaches, nasal congestion/difficulty breathing through nose, sneezing, itching, ear ache, anxiety, depression, hand/feet swelling, joint stiffness or pain, rash, change in color of mucus, and fever.    Vital Signs:  Patient profile:   60 year old female Height:      61 inches Weight:      129.31 pounds BMI:     24.52 O2 Sat:      99 % on Room air Temp:     98.3 degrees F oral Pulse rate:   92 / minute BP sitting:   132 / 74  (left arm) Cuff size:   regular  Vitals Entered By: Gweneth Dimitri RN (June 13, 2010 3:26 PM)  O2 Flow:  Room air CC: Follow up for surgery clearance.  Pt needs right total shoulder replacement.  States breathing is dong "really well."  Denies SOB, wheeizng, cehst tightness, cough. Comments Medications reviewed with patient Daytime contact number verified with patient. Gweneth Dimitri RN  June 13, 2010 3:26 PM    Physical Exam  Additional Exam:  Gen: Pleasant, well-nourished, in no distress , normal affect, cushingoid facies ENT: no lesions, no post nasal drip, trach site well healed,  orophyx clear Neck: No JVD, no TMG, no carotid bruits Lungs: coarse BS w/ pseudowheeze is noted , distant BS noted. Cardiovascular: RRR, heart sounds normal, no murmurs or gallops, no peripheral edema Abdomen: soft and non-tender, no HSM, BS normal Musculoskeletal: No deformities, no cyanosis or clubbing Neuro: alert, non-focal     Impression & Recommendations:  Problem # 1:  COPD (ICD-496) Assessment  Unchanged  Severe copd with primary emphysema component golds stage IV  spiro confirms same today plan No change in inhaled medications.   Maintain treatment program as currently prescribed. this pt can NOT be cleared for orthopedic surgery, she is too unstable for total shoulder replacement surgery  Medications Added to Medication List This Visit: 1)  Bisoprolol Fumarate 5 Mg Tabs (Bisoprolol fumarate) .... Take 1 tablet by mouth once a day 2)  Magnesium 250 Mg Tabs (Magnesium) .... Take 1 tablet by mouth once a day 3)  Aspirin 81 Mg Tbec (Aspirin) .... As needed 4)  Citracal Plus Tabs (Multiple minerals-vitamins) .... Take 1 tablet by mouth two times a day  Complete Medication List: 1)  Furosemide 20 Mg Tabs (Furosemide) .... Take one by mouth two times a day as needed 2)  Prednisone 5 Mg Tabs (Prednisone) .... 1/2 tablet daily 3)  Cardizem Cd 180  Mg Cp24 (Diltiazem hcl coated beads) .... One by mouth three times a day 4)  Omeprazole 40 Mg Cpdr (Omeprazole) .[name].. 1 by mouth two times a day 5)  Proventil Hfa 108 (90 Base) Mcg/act Aers (Albuterol sulfate) .[name].. 1-2 puffs every 4-6 hours as needed 6)  Reclast 5 Mg/145ml Soln (Zoledronic acid) .... Iv infusion yearly 7)  Vitamin D 1000 Unit Tabs (Cholecalciferol) .... Take 1 tablet by mouth once a day 8)  Crestor 10 Mg Tabs (Rosuvastatin calcium) .[name].. 1 by mouth at bedtime 9)  Zolpidem Tartrate 6.25 Mg Cr-tabs (Zolpidem tartrate) .... 2 at bedtime 10)  Bisoprolol Fumarate 5 Mg Tabs (Bisoprolol fumarate) .... Take 1 tablet by mouth once a day 11)  Hydrocodone-acetaminophen 5-500 Mg Tabs (Hydrocodone-acetaminophen) .[name].. 1 to 2 by mouth every 6 hour as needed pain 12)  Victoza 18 Mg/62ml Soln (Liraglutide) .... Once daily as directed 13)  Vitamin E 400 Unit Caps (Vitamin e) .... Take 1 capsule by mouth once a day 14)  Multivitamins Tabs (Multiple vitamin) .... Take 1 tablet by mouth once a day 15)  Vitamin C 1000 Mg Tabs (Ascorbic acid) ....  Take 2 tablet by mouth once a day as needed 16)  Spiriva Handihaler 18 Mcg Caps (Tiotropium bromide monohydrate) .... Two puffs in handihaler daily 17)  Accolate 20 Mg Tabs (Zafirlukast) .... One by mouth two times a day 18)  Citalopram Hydrobromide 20 Mg Tabs (Citalopram hydrobromide) .... Once daily 19)  Flutter Valve  .... Qid as needed 20)  Mucinex 600 Mg Xr12h-tab (Guaifenesin) .[name].. 1 tablet four times daily 21)  Oxygen  .... 1.5-2 liters 22)  Magnesium 250 Mg Tabs (Magnesium) .... Take 1 tablet by mouth once a day 23)  Aspirin 81 Mg Tbec (Aspirin) .... As needed 24)  Citracal Plus Tabs (Multiple minerals-vitamins) .... Take 1 tablet by mouth two times a day  Other Orders: Spirometry w/Graph (94010) Est. Patient Level IV (16109)  Patient Instructions: 1)  No change in medications 2)  I cannot clear you for planned shoulder surgery 3)  Return in     4     months  Appended Document: Pulmonary OV fax robyn sanders, justin chandler

## 2010-06-27 NOTE — Progress Notes (Signed)
Summary: surgery clearance -- needs ov  Phone Note From Other Clinic   Caller: susan w/ dr [facility] Call For: wright Summary of Call: caller is re-faxing (to triage fax now) a form for surgery clearance. nneds this back asap as pt's surgery is scheduled for mon 06/17/10. she says she faxed this previously on 06/10/10. her fax # is [phone]. contact # is [phone] Initial call taken by: Tivis Ringer, CNA,  June 12, 2010 9:56 AM  Follow-up for Phone Call        We have received this fax already.  Per PW, he cannot clear this pt for surgery clearance -- she will need OV first.  Called, spoke with Darl Pikes.  She was informed of above and verbalized understanding.  Advised I would call pt to schedule OV -- maybe she can come in tomorrow in [place] as PW has no openings today. Follow-up by: Gweneth Dimitri RN,  June 12, 2010 10:35 AM  Additional Follow-up for Phone Call Additional follow up Details #1::        Called, spoke with pt.  ROV scheduled for tomorrow at 3:15pm in [place] - pt aware.  LMOMTCB to inform Darl Pikes of this.   Gweneth Dimitri RN  June 12, 2010 10:44 AM  Darl Pikes returned call.  She was informed OV scheduled with PW for tomorrow at 3:15 for surgery clearance.   Additional Follow-up by: Gweneth Dimitri RN,  June 12, 2010 11:00 AM

## 2010-06-28 NOTE — Medication Information (Signed)
Summary: Spiriva/[employer]  Spiriva/[employer]   Imported By: Sherian Rein 10/05/2009 09:51:43  _____________________________________________________________________  External Attachment:    Type:   Image     Comment:   External Document

## 2010-07-03 NOTE — Progress Notes (Signed)
Summary: pt needs a new flutter valve  Phone Note Call from Patient   Caller: Patient Call For: wright Summary of Call: Patient phoned and needs a new flutter valve the mouth piece on the one she currently has is broken. She doesnt know what company would supply this the one she has she was given at the hospital years ago. She can be reached [phone] Initial call taken by: Vedia Coffer,  June 26, 2010 9:11 AM  Follow-up for Phone Call        Order sent to [facility] for new flutter valve.  Pt uses [facility] for [employer].  Pt reports that she can come to office to pick one up if need be. Abigail Miyamoto RN  June 26, 2010 9:53 AM

## 2010-07-10 ENCOUNTER — Encounter: Payer: Self-pay | Admitting: Critical Care Medicine

## 2010-07-17 ENCOUNTER — Telehealth (INDEPENDENT_AMBULATORY_CARE_PROVIDER_SITE_OTHER): Payer: Self-pay | Admitting: *Deleted

## 2010-07-23 NOTE — Progress Notes (Signed)
Summary: waiting on rx > furosemide sent to pharmacy  Phone Note Call from Patient Call back at Home Phone [phone] [facility]     Caller: Patient Call For: wright Summary of Call: pt still waiting for furosemide  at pharmacy (they faxed request for this on 2/16). [facility] on [address]  Initial call taken by: Tivis Ringer, CNA,  July 17, 2010 5:15 PM  Follow-up for Phone Call        last refilled by [facility] #60 w/ 6 refills on 4.26.11, last seen by [facility] 1.16.12.    refills sent to pt's verified pharmacy.  pt is aware. Boone Master CNA/MA  July 17, 2010 5:25 PM     Prescriptions: FUROSEMIDE 20 MG  TABS (FUROSEMIDE) take one by mouth two times a day as needed  #60 x 6   Entered by:   Boone Master CNA/MA   Authorized by:   Storm Frisk MD   Signed by:   Boone Master CNA/MA on 07/17/2010   Method used:   Electronically to        [facility] [phone]* (retail)       [address] [place], [place]  [address]       Ph: [phone] or [phone]       Fax: [phone]   RxID:   [id number]

## 2010-07-23 NOTE — Progress Notes (Signed)
Summary: cough, SOB, prod cough > pred taper, doxy rx  Phone Note Call from Patient Call back at Home Phone [phone] [facility]     Caller: Patient Call For: wright Summary of Call: Pt c/o coughing up yellow mucous x2 days wants something called in pls advise.//[facility] [address] Initial call taken by: Darletta Moll,  July 17, 2010 11:19 AM  Follow-up for Phone Call        Called, spoke with pt.  States she usually has a prod cough only first thing in the mornings with clear mucus.  Since yesterday, she is coughing up yellow mucus all day.  Also having chest congestion, increased SOB, rattling in chest, wheezing x 2 days.  Denies f/c/s.  OV offered but pt declined.  States she does not feel that she needs to come in right now and requesting abx.  Aware PW out of the office today so will send message to MR to address.   Allergies (verified): ! [facility] [address]  Last OV with PW 06/13/10 was told to f/u in 4 months.    MR, pls advise.  Thanks!  Follow-up by: Gweneth Dimitri RN,  July 17, 2010 12:53 PM  Additional Follow-up for Phone Call Additional follow up Details #1::        prednisone 40mg  by mouth daily x 3 days, then 30mg  by mouth daily x 3 days, then 20mg  by mouth daily x 3 days, then 10mg  by mouth daily x 3 days, then  got to baseline doxycyline 100mg  by mouth two times a day x 5 days.  go to er if worse or not better Additional Follow-up by: Kalman Shan MD,  July 17, 2010 3:08 PM    Additional Follow-up for Phone Call Additional follow up Details #2::    Called, spoke with pt. She was informed of above recs per MR and verbalized understanding. Aware pred and doxy rxs sent to pharm. Follow-up by: Gweneth Dimitri RN,  July 17, 2010 3:16 PM  New/Updated Medications: PREDNISONE 10 MG TABS (PREDNISONE) 4mg  by mouth dailyx3 days,then 30mg  by mouth dailyx3 days,then 20mg  by mouth dailyx3 days,then 10mg  by mouth dailyx2 days,then stop PREDNISONE 10 MG TABS  (PREDNISONE) 40mg  by mouth dailyx3 days,then 30mg  dailyx3 days,then 20mg  dailyx3 days,then 10mg  dailyx2 days,then back to baseline dose DOXYCYCLINE HYCLATE 100 MG CAPS (DOXYCYCLINE HYCLATE) Take 1 capsule by mouth two times a day x 5 days Prescriptions: DOXYCYCLINE HYCLATE 100 MG CAPS (DOXYCYCLINE HYCLATE) Take 1 capsule by mouth two times a day x 5 days  #10 x 0   Entered by:   Gweneth Dimitri RN   Authorized by:   Kalman Shan MD   Signed by:   Gweneth Dimitri RN on 07/17/2010   Method used:   Electronically to        [facility] [phone]* (retail)       [address] [place], [place]  [address]       Ph: [phone] or [phone]       Fax: [phone]   RxID:   [id number] PREDNISONE 10 MG TABS (PREDNISONE) 40mg  by mouth dailyx3 days,then 30mg  dailyx3 days,then 20mg  dailyx3 days,then 10mg  dailyx2 days,then back to baseline dose  #30 x 0   Entered by:   Gweneth Dimitri RN   Authorized by:   Kalman Shan MD   Signed by:   Gweneth Dimitri RN on 07/17/2010   Method used:   Electronically  to        [facility] [phone]* (retail)       [address] [place], [place]  [address]       Ph: [phone] or [phone]       Fax: [phone]   RxID:   [id number]

## 2010-07-23 NOTE — Letter (Signed)
Summary: CMN  for Device / [facility]  CMN  for Device / [facility]   Imported By: Lennie Odor 07/18/2010 15:05:42  _____________________________________________________________________  External Attachment:    Type:   Image     Comment:   External Document

## 2010-08-12 LAB — GLUCOSE, CAPILLARY
Glucose-Capillary: 132 mg/dL — ABNORMAL HIGH (ref 70–99)
Glucose-Capillary: 138 mg/dL — ABNORMAL HIGH (ref 70–99)
Glucose-Capillary: 147 mg/dL — ABNORMAL HIGH (ref 70–99)
Glucose-Capillary: 160 mg/dL — ABNORMAL HIGH (ref 70–99)
Glucose-Capillary: 172 mg/dL — ABNORMAL HIGH (ref 70–99)
Glucose-Capillary: 196 mg/dL — ABNORMAL HIGH (ref 70–99)

## 2010-08-12 LAB — IRON AND TIBC
Iron: 31 ug/dL — ABNORMAL LOW (ref 42–135)
UIBC: 311 ug/dL

## 2010-08-12 LAB — BLOOD GAS, ARTERIAL
Bicarbonate: 28.6 mEq/L — ABNORMAL HIGH (ref 20.0–24.0)
Patient temperature: 98.5
TCO2: 26.4 mmol/L (ref 0–100)
pH, Arterial: 7.315 — ABNORMAL LOW (ref 7.350–7.400)
pO2, Arterial: 96.8 mmHg (ref 80.0–100.0)

## 2010-08-12 LAB — FOLATE: Folate: >20 ng/mL

## 2010-08-12 LAB — DIFFERENTIAL
Basophils Absolute: 0 10*3/uL (ref 0.0–0.1)
Basophils Relative: 0 % (ref 0–1)
Eosinophils Absolute: 0.4 10*3/uL (ref 0.0–0.7)
Eosinophils Relative: 5 % (ref 0–5)
Neutrophils Relative %: 58 % (ref 43–77)

## 2010-08-12 LAB — BASIC METABOLIC PANEL
BUN: 25 mg/dL — ABNORMAL HIGH (ref 6–23)
CO2: 31 mEq/L (ref 19–32)
Calcium: 9 mg/dL (ref 8.4–10.5)
Chloride: 106 mEq/L (ref 96–112)
Creatinine, Ser: 1.02 mg/dL (ref 0.4–1.2)
GFR calc non Af Amer: >60 mL/min (ref >60)
Glucose, Bld: 123 mg/dL — ABNORMAL HIGH (ref 70–99)
Glucose, Bld: 151 mg/dL — ABNORMAL HIGH (ref 70–99)
Potassium: 4.3 mEq/L (ref 3.5–5.1)
Sodium: 139 mEq/L (ref 135–145)
Sodium: 141 mEq/L (ref 135–145)

## 2010-08-12 LAB — CBC
HCT: 33 % — ABNORMAL LOW (ref 36.0–46.0)
HCT: 36 % (ref 36.0–46.0)
Hemoglobin: 10.6 g/dL — ABNORMAL LOW (ref 12.0–15.0)
Hemoglobin: 10.6 g/dL — ABNORMAL LOW (ref 12.0–15.0)
MCHC: 31.7 g/dL (ref 30.0–36.0)
MCV: 89.5 fL (ref 78.0–100.0)
MCV: 89.9 fL (ref 78.0–100.0)
Platelets: 229 10*3/uL (ref 150–400)
RDW: 13.9 % (ref 11.5–15.5)
RDW: 14.1 % (ref 11.5–15.5)
WBC: 15.7 10*3/uL — ABNORMAL HIGH (ref 4.0–10.5)

## 2010-08-12 LAB — POCT I-STAT, CHEM 8
Chloride: 106 mEq/L (ref 96–112)
Creatinine, Ser: 1 mg/dL (ref 0.4–1.2)
Glucose, Bld: 104 mg/dL — ABNORMAL HIGH (ref 70–99)
Hemoglobin: 13.3 g/dL (ref 12.0–15.0)
Sodium: 144 mEq/L (ref 135–145)

## 2010-08-12 LAB — BRAIN NATRIURETIC PEPTIDE: Pro B Natriuretic peptide (BNP): 52.9 pg/mL (ref 0.0–100.0)

## 2010-08-12 LAB — RETICULOCYTES
RBC.: 4.28 MIL/uL (ref 3.87–5.11)
Retic Count, Absolute: 25.7 10*3/uL (ref 19.0–186.0)

## 2010-08-12 LAB — COMPREHENSIVE METABOLIC PANEL
AST: 20 U/L (ref 0–37)
Albumin: 3.7 g/dL (ref 3.5–5.2)
BUN: 11 mg/dL (ref 6–23)
Calcium: 8.7 mg/dL (ref 8.4–10.5)
Chloride: 105 mEq/L (ref 96–112)
Creatinine, Ser: 0.98 mg/dL (ref 0.4–1.2)
GFR calc Af Amer: >60 mL/min (ref >60)
Total Bilirubin: 0.5 mg/dL (ref 0.3–1.2)
Total Protein: 7.4 g/dL (ref 6.0–8.3)

## 2010-08-12 LAB — LIPID PANEL
Cholesterol: 163 mg/dL (ref 0–200)
HDL: 74 mg/dL (ref >39)
LDL Cholesterol: 77 mg/dL (ref 0–99)
Total CHOL/HDL Ratio: 2.2 RATIO
Triglycerides: 62 mg/dL (ref <150)

## 2010-08-12 LAB — HEMOGLOBIN A1C: Mean Plasma Glucose: 134 mg/dL — ABNORMAL HIGH (ref <117)

## 2010-08-12 LAB — FERRITIN: Ferritin: 130 ng/mL (ref 10–291)

## 2010-08-19 ENCOUNTER — Telehealth: Payer: Self-pay | Admitting: Critical Care Medicine

## 2010-08-19 MED ORDER — MOXIFLOXACIN HCL 400 MG PO TABS: 400.0000 mg | ORAL_TABLET | Freq: Every day | ORAL | Status: AC

## 2010-08-19 NOTE — Telephone Encounter (Signed)
Spoke with pt and advised of recommendations. rx sent. Carron Curie, CMA

## 2010-08-19 NOTE — Telephone Encounter (Signed)
Spoke with pt and she c/o dry cough w/ occas clear phlem, some wheezing, chest congestion, chest pressure x Thursday. Pt states it sounds like she has "bells and whistles" in her chest. Pt states she doesn't feel bad but she doesn't;t want to get to the point to where she feels bad. Pt has been taking her pred 0.5 mg once a day, flutter valve 4 times a day, mucinex 4 tablets a day. Pt requesting something be called in or if she needs to be seen. Dr. [name] please advise. Thanks  Allergies  Allergen Reactions  . Esomeprazole Magnesium    Carver Fila, CMA

## 2010-08-19 NOTE — Telephone Encounter (Signed)
Call in Avelox 400mg /d x 5days Ov if unimproved

## 2010-08-26 ENCOUNTER — Ambulatory Visit (INDEPENDENT_AMBULATORY_CARE_PROVIDER_SITE_OTHER): Payer: BLUE CROSS/BLUE SHIELD | Admitting: Obstetrics and Gynecology

## 2010-08-26 ENCOUNTER — Emergency Department (HOSPITAL_COMMUNITY): Payer: Medicare Other

## 2010-08-26 ENCOUNTER — Telehealth: Payer: Self-pay | Admitting: Adult Health

## 2010-08-26 ENCOUNTER — Inpatient Hospital Stay (HOSPITAL_COMMUNITY)
Admission: EM | Admit: 2010-08-26 | Discharge: 2010-08-31 | DRG: 190 | Disposition: A | Discharge status: Home / Self Care | Payer: Medicare Other | Attending: Internal Medicine | Admitting: Internal Medicine

## 2010-08-26 DIAGNOSIS — K219 Gastro-esophageal reflux disease without esophagitis: Secondary | ICD-10-CM | POA: Diagnosis present

## 2010-08-26 DIAGNOSIS — Z9981 Dependence on supplemental oxygen: Secondary | ICD-10-CM

## 2010-08-26 DIAGNOSIS — Z87891 Personal history of nicotine dependence: Secondary | ICD-10-CM

## 2010-08-26 DIAGNOSIS — Z932 Ileostomy status: Secondary | ICD-10-CM

## 2010-08-26 DIAGNOSIS — R82998 Other abnormal findings in urine: Secondary | ICD-10-CM

## 2010-08-26 DIAGNOSIS — IMO0002 Reserved for concepts with insufficient information to code with codable children: Secondary | ICD-10-CM

## 2010-08-26 DIAGNOSIS — N898 Other specified noninflammatory disorders of vagina: Secondary | ICD-10-CM

## 2010-08-26 DIAGNOSIS — D649 Anemia, unspecified: Secondary | ICD-10-CM | POA: Diagnosis present

## 2010-08-26 DIAGNOSIS — T380X5A Adverse effect of glucocorticoids and synthetic analogues, initial encounter: Secondary | ICD-10-CM | POA: Diagnosis present

## 2010-08-26 DIAGNOSIS — E139 Other specified diabetes mellitus without complications: Secondary | ICD-10-CM | POA: Diagnosis present

## 2010-08-26 DIAGNOSIS — J9620 Acute and chronic respiratory failure, unspecified whether with hypoxia or hypercapnia: Secondary | ICD-10-CM | POA: Diagnosis not present

## 2010-08-26 DIAGNOSIS — B373 Candidiasis of vulva and vagina: Secondary | ICD-10-CM

## 2010-08-26 DIAGNOSIS — B3731 Acute candidiasis of vulva and vagina: Secondary | ICD-10-CM | POA: Diagnosis present

## 2010-08-26 DIAGNOSIS — Z79899 Other long term (current) drug therapy: Secondary | ICD-10-CM

## 2010-08-26 DIAGNOSIS — Z7982 Long term (current) use of aspirin: Secondary | ICD-10-CM

## 2010-08-26 DIAGNOSIS — Z9049 Acquired absence of other specified parts of digestive tract: Secondary | ICD-10-CM

## 2010-08-26 DIAGNOSIS — I10 Essential (primary) hypertension: Secondary | ICD-10-CM | POA: Diagnosis present

## 2010-08-26 DIAGNOSIS — J441 Chronic obstructive pulmonary disease with (acute) exacerbation: Principal | ICD-10-CM | POA: Diagnosis present

## 2010-08-26 DIAGNOSIS — R000 Tachycardia, unspecified: Secondary | ICD-10-CM | POA: Diagnosis present

## 2010-08-26 DIAGNOSIS — N390 Urinary tract infection, site not specified: Secondary | ICD-10-CM

## 2010-08-26 LAB — CBC
HCT: 31.8 % — ABNORMAL LOW (ref 36.0–46.0)
MCV: 85.9 fL (ref 78.0–100.0)
RBC: 3.7 MIL/uL — ABNORMAL LOW (ref 3.87–5.11)
WBC: 9.3 10*3/uL (ref 4.0–10.5)

## 2010-08-26 LAB — DIFFERENTIAL
Eosinophils Relative: 1 % (ref 0–5)
Lymphocytes Relative: 13 % (ref 12–46)
Lymphs Abs: 1.2 10*3/uL (ref 0.7–4.0)
Neutrophils Relative %: 75 % (ref 43–77)

## 2010-08-26 NOTE — Telephone Encounter (Signed)
lmomtcb x1  On cell and home number.

## 2010-08-27 ENCOUNTER — Inpatient Hospital Stay (HOSPITAL_COMMUNITY): Payer: Medicare Other

## 2010-08-27 DIAGNOSIS — R0602 Shortness of breath: Secondary | ICD-10-CM

## 2010-08-27 DIAGNOSIS — J441 Chronic obstructive pulmonary disease with (acute) exacerbation: Secondary | ICD-10-CM

## 2010-08-27 LAB — POCT I-STAT, CHEM 8
Chloride: 105 mEq/L (ref 96–112)
Glucose, Bld: 139 mg/dL — ABNORMAL HIGH (ref 70–99)
HCT: 33 % — ABNORMAL LOW (ref 36.0–46.0)
Potassium: 3.8 mEq/L (ref 3.5–5.1)

## 2010-08-27 LAB — GLUCOSE, CAPILLARY
Glucose-Capillary: 166 mg/dL — ABNORMAL HIGH (ref 70–99)
Glucose-Capillary: 110 mg/dL — ABNORMAL HIGH (ref 70–99)
Glucose-Capillary: 144 mg/dL — ABNORMAL HIGH (ref 70–99)

## 2010-08-27 NOTE — Telephone Encounter (Signed)
lmomtcb x2 on both cell and home number

## 2010-08-27 NOTE — Telephone Encounter (Signed)
noted 

## 2010-08-27 NOTE — Telephone Encounter (Signed)
Will forward message to Dr. [name] as Lorain Childes so he is aware pt is inpt at [facility].

## 2010-08-27 NOTE — Telephone Encounter (Signed)
Pt returned call from triage. She is an inpatient at [facility]. Nothing further needed per pt. Tivis Ringer

## 2010-08-28 LAB — CBC
MCHC: 31.2 g/dL (ref 30.0–36.0)
RDW: 15.3 % (ref 11.5–15.5)

## 2010-08-28 LAB — BASIC METABOLIC PANEL
Calcium: 9.7 mg/dL (ref 8.4–10.5)
GFR calc Af Amer: >60 mL/min (ref >60)
GFR calc non Af Amer: 55 mL/min — ABNORMAL LOW (ref >60)
Glucose, Bld: 143 mg/dL — ABNORMAL HIGH (ref 70–99)
Sodium: 140 mEq/L (ref 135–145)

## 2010-08-28 LAB — GLUCOSE, CAPILLARY
Glucose-Capillary: 158 mg/dL — ABNORMAL HIGH (ref 70–99)
Glucose-Capillary: 130 mg/dL — ABNORMAL HIGH (ref 70–99)

## 2010-08-29 LAB — GLUCOSE, CAPILLARY
Glucose-Capillary: 147 mg/dL — ABNORMAL HIGH (ref 70–99)
Glucose-Capillary: 130 mg/dL — ABNORMAL HIGH (ref 70–99)

## 2010-08-30 LAB — GLUCOSE, CAPILLARY: Glucose-Capillary: 136 mg/dL — ABNORMAL HIGH (ref 70–99)

## 2010-08-31 LAB — DIFFERENTIAL
Basophils Absolute: 0 10*3/uL (ref 0.0–0.1)
Basophils Relative: 0 % (ref 0–1)
Eosinophils Absolute: 0 10*3/uL (ref 0.0–0.7)
Eosinophils Relative: 0 % (ref 0–5)

## 2010-08-31 LAB — CBC
Platelets: 177 10*3/uL (ref 150–400)
RDW: 15.4 % (ref 11.5–15.5)
WBC: 15.2 10*3/uL — ABNORMAL HIGH (ref 4.0–10.5)

## 2010-08-31 LAB — GLUCOSE, CAPILLARY: Glucose-Capillary: 101 mg/dL — ABNORMAL HIGH (ref 70–99)

## 2010-09-03 NOTE — Consult Note (Signed)
[name], [name]           ACCOUNT NO.:  [id number]  MEDICAL RECORD NO.:  [id number]           PATIENT TYPE:  I  LOCATION:  1303                         FACILITY:  [facility]  PHYSICIAN:  [name]. [name], MD, FCCPDATE OF BIRTH:  05-31-1950  DATE OF CONSULTATION:  08/27/2010 DATE OF DISCHARGE:                                CONSULTATION   REASON FOR CONSULTATION:  COPD exacerbation.  HISTORY:  This is an exceptionally complicated 60 year old black female who carries diagnoses of COPD and Crohn disease for which she is chronically oxygen dependent and steroid dependent at a "floor dose" of 2.5 mg per day "for as long as she can remember" (as I believe this is about 10 years).  She is on exceptionally complicated outpatient regimen consisting of 23 medications and came to the hospital with a 2-week history of worsening dyspnea, cough and hoarseness with minimal yellowish mucus but no chest pain, fever, chills or sweats.  She was admitted on August 26, 2010, and Pulmonary was asked to see her for any insight into managing her COPD and preventing recurrent hospitalizations.  She states she did call the office and received a course of Avelox but this "did no good."  PAST MEDICAL HISTORY: 1. COPD. 2. Crohn disease status post total colectomy and ileostomy. 3. History of vent dependent respiratory failure for which she     required tracheostomy in the setting of apparent cavitary     pneumonia. 4. 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 5. Hypertension. 6. GERD. 7. History of esophageal stricture. 8. Status post abdominal hysterectomy.  ALLERGIES:  None known.  MEDICATIONS:  As outpatient she is on furosemide, prednisone 2.5 mg per day, Cardizem, omeprazole, Proventil, Reclast, Crestor, zolpidem, bisoprolol, Tylenol #3, Vicodin, Victoza, vitamin E, multivitamin, vitamin C, Spiriva, Accolate, citalopram, Mucinex, oxygen at 2 L continuously, magnesium, Citracal and  aspirin.  SOCIAL HISTORY:  She quit smoking about 5 years ago.  FAMILY HISTORY:  Significant for diabetes.  REVIEW OF SYSTEMS:  Taken in detail and significant for weight loss which was initially voluntary and now somewhat unexplained but denies any difficulty swallowing at present, chronic fever, chills, sweats, itching, sneezing or wheezing.  PHYSICAL EXAMINATION:  GENERAL:  This is an obviously cushingoid appearing black female who is moderately hoarse. VITAL SIGNS:  She has afebrile vital signs. HEENT:  Unremarkable.  Oropharynx is clear. NECK:  Supple without cervical adenopathy or tenderness.  Trachea is midline.  No organomegaly. LUNGS:  Lung fields reveal classic pseudo wheeze.  There are no true wheezes.  There is a regular rate and rhythm without murmur, gallop or rub present. ABDOMEN:  Soft and benign with no palpable organomegaly, mass or tenderness. EXTREMITIES:  Warm without calf tenderness, cyanosis, clubbing, or edema.  IMPRESSION:  Apparent chronic obstructive pulmonary disease exacerbation in the setting of having previously undergone tracheostomy.  She has more upper airway than lower airway wheezing and will need followup as an outpatient with full set of PFTs.  It appears that she is already beginning to respond to treatment directed at COPD/asthmatic bronchitis but I would add that sinus CT scan should be probably be done and a  flow volume loop can certainly be done during this admission if the issue of upper versus lower airway disease remains.  Lastly, I spent extra time reviewing our philosophy with this patient that she should be seen by our nurse practitioner within 3 to 5 days of any exacerbation (note that this present exacerbation started 2 weeks prior to admission and she had plenty of time to be seen had we worked out an effective outpatient action plan in advance).  Emphasized she should see me, Dr. [name], or our nurse practitioner within 3  days of any exacerbation and not expected to receive her recommendations over the phone.     [name]. [name], MD, [facility]     MBW/MEDQ  D:  08/27/2010  T:  08/28/2010  Job:  [id number]  Electronically Signed by Sandrea Hughs MD FCCP on 09/03/2010 08:32:21 PM

## 2010-09-05 LAB — BASIC METABOLIC PANEL
BUN: 20 mg/dL (ref 6–23)
CO2: 17 mEq/L — ABNORMAL LOW (ref 19–32)
Calcium: 9.3 mg/dL (ref 8.4–10.5)
Chloride: 110 mEq/L (ref 96–112)
Creatinine, Ser: 1.04 mg/dL (ref 0.4–1.2)
GFR calc Af Amer: >60 mL/min (ref >60)
GFR calc Af Amer: >60 mL/min (ref >60)
GFR calc Af Amer: >60 mL/min (ref >60)
GFR calc Af Amer: >60 mL/min (ref >60)
GFR calc non Af Amer: >60 mL/min (ref >60)
GFR calc non Af Amer: 58 mL/min — ABNORMAL LOW (ref >60)
GFR calc non Af Amer: >60 mL/min (ref >60)
Glucose, Bld: 109 mg/dL — ABNORMAL HIGH (ref 70–99)
Glucose, Bld: 96 mg/dL (ref 70–99)
Potassium: 3.5 mEq/L (ref 3.5–5.1)
Potassium: 4.1 mEq/L (ref 3.5–5.1)
Potassium: 4 mEq/L (ref 3.5–5.1)
Potassium: 4.2 mEq/L (ref 3.5–5.1)
Sodium: 133 mEq/L — ABNORMAL LOW (ref 135–145)
Sodium: 135 mEq/L (ref 135–145)
Sodium: 132 mEq/L — ABNORMAL LOW (ref 135–145)
Sodium: 136 mEq/L (ref 135–145)

## 2010-09-05 LAB — PREALBUMIN: Prealbumin: 22.6 mg/dL (ref 18.0–45.0)

## 2010-09-06 ENCOUNTER — Encounter: Payer: Self-pay | Admitting: Adult Health

## 2010-09-09 ENCOUNTER — Encounter: Payer: Self-pay | Admitting: Adult Health

## 2010-09-09 ENCOUNTER — Ambulatory Visit (INDEPENDENT_AMBULATORY_CARE_PROVIDER_SITE_OTHER): Payer: BLUE CROSS/BLUE SHIELD | Admitting: Adult Health

## 2010-09-09 DIAGNOSIS — J449 Chronic obstructive pulmonary disease, unspecified: Secondary | ICD-10-CM

## 2010-09-09 DIAGNOSIS — J189 Pneumonia, unspecified organism: Secondary | ICD-10-CM

## 2010-09-09 DIAGNOSIS — J4489 Other specified chronic obstructive pulmonary disease: Secondary | ICD-10-CM

## 2010-09-09 LAB — BLOOD GAS, ARTERIAL
Acid-Base Excess: 0.2 mmol/L (ref 0.0–2.0)
Acid-Base Excess: 5.7 mmol/L — ABNORMAL HIGH (ref 0.0–2.0)
Acid-base deficit: 0.1 mmol/L (ref 0.0–2.0)
Acid-base deficit: 0.3 mmol/L (ref 0.0–2.0)
Acid-base deficit: 0.8 mmol/L (ref 0.0–2.0)
Acid-base deficit: 1.5 mmol/L (ref 0.0–2.0)
Bicarbonate: 25.8 mEq/L — ABNORMAL HIGH (ref 20.0–24.0)
Bicarbonate: 26.4 mEq/L — ABNORMAL HIGH (ref 20.0–24.0)
Bicarbonate: 29.4 mEq/L — ABNORMAL HIGH (ref 20.0–24.0)
Bicarbonate: 32.7 mEq/L — ABNORMAL HIGH (ref 20.0–24.0)
Bicarbonate: 34.6 mEq/L — ABNORMAL HIGH (ref 20.0–24.0)
Bicarbonate: 35.3 mEq/L — ABNORMAL HIGH (ref 20.0–24.0)
Drawn by: 129801
Drawn by: 145321
Drawn by: 232811
Drawn by: 235321
Drawn by: 235321
Drawn by: 275531
Drawn by: 275531
FIO2: 0.3 %
FIO2: 0.3 %
FIO2: 0.3 %
FIO2: 0.3 %
FIO2: 0.5 %
FIO2: 0.8 %
FIO2: 1 %
FIO2: 1 %
MECHVT: 0.48 mL
MECHVT: 0.6 mL
MECHVT: 480 mL
MECHVT: 600 mL
O2 Saturation: 87.3 %
O2 Saturation: 91.8 %
O2 Saturation: 93.2 %
O2 Saturation: 95.9 %
O2 Saturation: 96 %
O2 Saturation: 99.2 %
PEEP: 10 cmH2O
PEEP: 5 cmH2O
PEEP: 5 cmH2O
PEEP: 5 cmH2O
PEEP: 8 cmH2O
PEEP: 8 cmH2O
Patient temperature: 98.3
Patient temperature: 98.6
Patient temperature: 98.6
Patient temperature: 98.6
Patient temperature: 98.6
Patient temperature: 99.4
Pressure control: 25 cmH2O
RATE: 12 resp/min
RATE: 12 resp/min
RATE: 15 resp/min
RATE: 15 resp/min
RATE: 16 resp/min
RATE: 20 resp/min
RATE: 20 resp/min
TCO2: 23.9 mmol/L (ref 0–100)
TCO2: 24.4 mmol/L (ref 0–100)
TCO2: 24.6 mmol/L (ref 0–100)
TCO2: 26 mmol/L (ref 0–100)
TCO2: 26.8 mmol/L (ref 0–100)
TCO2: 31.3 mmol/L (ref 0–100)
pCO2 arterial: 41.1 mmHg (ref 35.0–45.0)
pCO2 arterial: 51.3 mmHg — ABNORMAL HIGH (ref 35.0–45.0)
pCO2 arterial: 55.9 mmHg — ABNORMAL HIGH (ref 35.0–45.0)
pCO2 arterial: 56.5 mmHg — ABNORMAL HIGH (ref 35.0–45.0)
pCO2 arterial: 77.2 mmHg (ref 35.0–45.0)
pCO2 arterial: 82.3 mmHg (ref 35.0–45.0)
pH, Arterial: 7.217 — ABNORMAL LOW (ref 7.350–7.400)
pH, Arterial: 7.27 — ABNORMAL LOW (ref 7.350–7.400)
pH, Arterial: 7.346 — ABNORMAL LOW (ref 7.350–7.400)
pH, Arterial: 7.396 (ref 7.350–7.400)
pH, Arterial: 7.402 — ABNORMAL HIGH (ref 7.350–7.400)
pO2, Arterial: 214 mmHg — ABNORMAL HIGH (ref 80.0–100.0)
pO2, Arterial: 73.7 mmHg — ABNORMAL LOW (ref 80.0–100.0)
pO2, Arterial: 85.4 mmHg (ref 80.0–100.0)
pO2, Arterial: 92.8 mmHg (ref 80.0–100.0)
pO2, Arterial: 95 mmHg (ref 80.0–100.0)

## 2010-09-09 LAB — URINALYSIS, ROUTINE W REFLEX MICROSCOPIC
Bilirubin Urine: NEGATIVE
Glucose, UA: 100 mg/dL — AB
Ketones, ur: NEGATIVE mg/dL
pH: 6 (ref 5.0–8.0)

## 2010-09-09 LAB — DIFFERENTIAL
Basophils Absolute: 0 10*3/uL (ref 0.0–0.1)
Basophils Relative: 0 % (ref 0–1)
Eosinophils Absolute: 0.4 10*3/uL (ref 0.0–0.7)
Neutro Abs: 17.2 10*3/uL — ABNORMAL HIGH (ref 1.7–7.7)
Neutrophils Relative %: 77 % (ref 43–77)

## 2010-09-09 LAB — CBC
HCT: 28.7 % — ABNORMAL LOW (ref 36.0–46.0)
HCT: 29 % — ABNORMAL LOW (ref 36.0–46.0)
HCT: 30.3 % — ABNORMAL LOW (ref 36.0–46.0)
HCT: 30.6 % — ABNORMAL LOW (ref 36.0–46.0)
HCT: 30.7 % — ABNORMAL LOW (ref 36.0–46.0)
HCT: 30.8 % — ABNORMAL LOW (ref 36.0–46.0)
HCT: 38.7 % (ref 36.0–46.0)
Hemoglobin: 10.7 g/dL — ABNORMAL LOW (ref 12.0–15.0)
Hemoglobin: 12.3 g/dL (ref 12.0–15.0)
Hemoglobin: 9.3 g/dL — ABNORMAL LOW (ref 12.0–15.0)
Hemoglobin: 9.5 g/dL — ABNORMAL LOW (ref 12.0–15.0)
Hemoglobin: 9.9 g/dL — ABNORMAL LOW (ref 12.0–15.0)
MCHC: 31.7 g/dL (ref 30.0–36.0)
MCHC: 31.8 g/dL (ref 30.0–36.0)
MCHC: 31.8 g/dL (ref 30.0–36.0)
MCHC: 32 g/dL (ref 30.0–36.0)
MCHC: 32.3 g/dL (ref 30.0–36.0)
MCV: 92.1 fL (ref 78.0–100.0)
MCV: 92.6 fL (ref 78.0–100.0)
MCV: 93 fL (ref 78.0–100.0)
MCV: 93.8 fL (ref 78.0–100.0)
MCV: 95.2 fL (ref 78.0–100.0)
Platelets: 110 10*3/uL — ABNORMAL LOW (ref 150–400)
Platelets: 112 10*3/uL — ABNORMAL LOW (ref 150–400)
Platelets: 134 10*3/uL — ABNORMAL LOW (ref 150–400)
Platelets: 90 10*3/uL — ABNORMAL LOW (ref 150–400)
Platelets: 91 10*3/uL — ABNORMAL LOW (ref 150–400)
RBC: 3.28 MIL/uL — ABNORMAL LOW (ref 3.87–5.11)
RBC: 3.32 MIL/uL — ABNORMAL LOW (ref 3.87–5.11)
RBC: 3.65 MIL/uL — ABNORMAL LOW (ref 3.87–5.11)
RBC: 4.19 MIL/uL (ref 3.87–5.11)
RDW: 14.3 % (ref 11.5–15.5)
RDW: 14.5 % (ref 11.5–15.5)
RDW: 14.9 % (ref 11.5–15.5)
RDW: 15 % (ref 11.5–15.5)
RDW: 15.1 % (ref 11.5–15.5)
RDW: 15.1 % (ref 11.5–15.5)
RDW: 15.1 % (ref 11.5–15.5)
RDW: 15.4 % (ref 11.5–15.5)
RDW: 15.4 % (ref 11.5–15.5)
WBC: 16.5 10*3/uL — ABNORMAL HIGH (ref 4.0–10.5)
WBC: 17.8 10*3/uL — ABNORMAL HIGH (ref 4.0–10.5)
WBC: 23.1 10*3/uL — ABNORMAL HIGH (ref 4.0–10.5)

## 2010-09-09 LAB — COMPREHENSIVE METABOLIC PANEL
ALT: 21 U/L (ref 0–35)
Alkaline Phosphatase: 49 U/L (ref 39–117)
Alkaline Phosphatase: 83 U/L (ref 39–117)
BUN: 11 mg/dL (ref 6–23)
BUN: 30 mg/dL — ABNORMAL HIGH (ref 6–23)
CO2: 31 mEq/L (ref 19–32)
CO2: 33 mEq/L — ABNORMAL HIGH (ref 19–32)
GFR calc non Af Amer: 58 mL/min — ABNORMAL LOW (ref >60)
GFR calc non Af Amer: >60 mL/min (ref >60)
Glucose, Bld: 173 mg/dL — ABNORMAL HIGH (ref 70–99)
Glucose, Bld: 275 mg/dL — ABNORMAL HIGH (ref 70–99)
Potassium: 3.7 mEq/L (ref 3.5–5.1)
Potassium: 4 mEq/L (ref 3.5–5.1)
Sodium: 142 mEq/L (ref 135–145)
Total Bilirubin: 0.9 mg/dL (ref 0.3–1.2)
Total Protein: 5.4 g/dL — ABNORMAL LOW (ref 6.0–8.3)
Total Protein: 6.9 g/dL (ref 6.0–8.3)

## 2010-09-09 LAB — BASIC METABOLIC PANEL
BUN: 20 mg/dL (ref 6–23)
BUN: 22 mg/dL (ref 6–23)
BUN: 22 mg/dL (ref 6–23)
BUN: 23 mg/dL (ref 6–23)
BUN: 58 mg/dL — ABNORMAL HIGH (ref 6–23)
CO2: 27 mEq/L (ref 19–32)
CO2: 27 mEq/L (ref 19–32)
CO2: 28 mEq/L (ref 19–32)
CO2: 32 mEq/L (ref 19–32)
Calcium: 7.2 mg/dL — ABNORMAL LOW (ref 8.4–10.5)
Calcium: 7.8 mg/dL — ABNORMAL LOW (ref 8.4–10.5)
Chloride: 103 mEq/L (ref 96–112)
Chloride: 103 mEq/L (ref 96–112)
Chloride: 104 mEq/L (ref 96–112)
Chloride: 107 mEq/L (ref 96–112)
Chloride: 114 mEq/L — ABNORMAL HIGH (ref 96–112)
Creatinine, Ser: 0.61 mg/dL (ref 0.4–1.2)
Creatinine, Ser: 0.66 mg/dL (ref 0.4–1.2)
Creatinine, Ser: 0.68 mg/dL (ref 0.4–1.2)
Creatinine, Ser: 0.74 mg/dL (ref 0.4–1.2)
Creatinine, Ser: 1.73 mg/dL — ABNORMAL HIGH (ref 0.4–1.2)
GFR calc Af Amer: >60 mL/min (ref >60)
GFR calc Af Amer: >60 mL/min (ref >60)
GFR calc Af Amer: >60 mL/min (ref >60)
GFR calc non Af Amer: >60 mL/min (ref >60)
GFR calc non Af Amer: >60 mL/min (ref >60)
GFR calc non Af Amer: >60 mL/min (ref >60)
GFR calc non Af Amer: >60 mL/min (ref >60)
Glucose, Bld: 140 mg/dL — ABNORMAL HIGH (ref 70–99)
Glucose, Bld: 158 mg/dL — ABNORMAL HIGH (ref 70–99)
Glucose, Bld: 163 mg/dL — ABNORMAL HIGH (ref 70–99)
Glucose, Bld: 172 mg/dL — ABNORMAL HIGH (ref 70–99)
Glucose, Bld: 197 mg/dL — ABNORMAL HIGH (ref 70–99)
Glucose, Bld: 207 mg/dL — ABNORMAL HIGH (ref 70–99)
Glucose, Bld: 291 mg/dL — ABNORMAL HIGH (ref 70–99)
Potassium: 3.7 mEq/L (ref 3.5–5.1)
Potassium: 3.8 mEq/L (ref 3.5–5.1)
Potassium: 4.1 mEq/L (ref 3.5–5.1)
Potassium: 4.6 mEq/L (ref 3.5–5.1)
Potassium: 4.9 mEq/L (ref 3.5–5.1)
Sodium: 145 mEq/L (ref 135–145)
Sodium: 146 mEq/L — ABNORMAL HIGH (ref 135–145)

## 2010-09-09 LAB — GLUCOSE, CAPILLARY
Glucose-Capillary: 133 mg/dL — ABNORMAL HIGH (ref 70–99)
Glucose-Capillary: 136 mg/dL — ABNORMAL HIGH (ref 70–99)
Glucose-Capillary: 142 mg/dL — ABNORMAL HIGH (ref 70–99)
Glucose-Capillary: 146 mg/dL — ABNORMAL HIGH (ref 70–99)
Glucose-Capillary: 163 mg/dL — ABNORMAL HIGH (ref 70–99)
Glucose-Capillary: 166 mg/dL — ABNORMAL HIGH (ref 70–99)
Glucose-Capillary: 168 mg/dL — ABNORMAL HIGH (ref 70–99)
Glucose-Capillary: 180 mg/dL — ABNORMAL HIGH (ref 70–99)
Glucose-Capillary: 187 mg/dL — ABNORMAL HIGH (ref 70–99)
Glucose-Capillary: 188 mg/dL — ABNORMAL HIGH (ref 70–99)
Glucose-Capillary: 194 mg/dL — ABNORMAL HIGH (ref 70–99)
Glucose-Capillary: 195 mg/dL — ABNORMAL HIGH (ref 70–99)
Glucose-Capillary: 195 mg/dL — ABNORMAL HIGH (ref 70–99)
Glucose-Capillary: 202 mg/dL — ABNORMAL HIGH (ref 70–99)
Glucose-Capillary: 203 mg/dL — ABNORMAL HIGH (ref 70–99)
Glucose-Capillary: 212 mg/dL — ABNORMAL HIGH (ref 70–99)
Glucose-Capillary: 212 mg/dL — ABNORMAL HIGH (ref 70–99)
Glucose-Capillary: 212 mg/dL — ABNORMAL HIGH (ref 70–99)
Glucose-Capillary: 214 mg/dL — ABNORMAL HIGH (ref 70–99)
Glucose-Capillary: 216 mg/dL — ABNORMAL HIGH (ref 70–99)
Glucose-Capillary: 221 mg/dL — ABNORMAL HIGH (ref 70–99)
Glucose-Capillary: 223 mg/dL — ABNORMAL HIGH (ref 70–99)
Glucose-Capillary: 258 mg/dL — ABNORMAL HIGH (ref 70–99)
Glucose-Capillary: 274 mg/dL — ABNORMAL HIGH (ref 70–99)

## 2010-09-09 LAB — CULTURE, RESPIRATORY W GRAM STAIN

## 2010-09-09 LAB — CULTURE, BLOOD (ROUTINE X 2)

## 2010-09-09 LAB — URINE CULTURE
Colony Count: >=100000
Special Requests: NEGATIVE

## 2010-09-09 LAB — URINE MICROSCOPIC-ADD ON

## 2010-09-09 LAB — MAGNESIUM: Magnesium: 3.2 mg/dL — ABNORMAL HIGH (ref 1.5–2.5)

## 2010-09-09 NOTE — Progress Notes (Signed)
  Subjective:    Patient ID: [name], female    DOB: 1951-05-13, 60 y.o.   MRN: [id number]  HPI 60 yo woman with severe COPD Golds Stage IV   April 29, 2010 The pt is doing well since August, She visited cousin in [place]. She was able to go up and down flight of stairs. No real cough except phlegm in the am.  No chest pain. Dyspnea is at baseline.  She does note was in hosp for 3days in [facility] Oct [facility]. No new issues.   June 13, 2010  Pt referred for surgical clearance. Pt needs R shoulder replacement. She is very limited by sever lung disease, stage IV copd oxygen dependent  >>not good surgical candidate.   09/09/2010 Post Hospital follow up  Pt presents for hospital follow up . Admitted 4/2-08/31/10 for AECOPD. CXR with no acute changes, CT sinus was neg. Tx with IV steroids and discharged on slow taper beginning at 60mg  to baseline maintence of 10mg  daily and empiric abx  Which she finished during her stay. She is feeling much better. Down to 20mg  daily of Prednisone. Wheezing and cough are much better.      Review of Systems Constitutional:   No  weight loss, night sweats,  Fevers,    HEENT:   No headaches,  Difficulty swallowing,  Tooth/dental problems, or  Sore throat,                No sneezing, itching, ear ache, nasal congestion, post nasal drip,   CV:  No chest pain,  Orthopnea, PND, swelling in lower extremities, anasarca, dizziness, palpitations, syncope.   GI  No heartburn, indigestion, abdominal pain, nausea, vomiting, diarrhea, change in bowel habits, loss of appetite, bloody stools.   Resp:  No excess mucus, no productive cough,   No coughing up of blood.  No change in color of mucus.  No wheezing.  No chest wall deformity  Skin: no rash or lesions.  GU: no dysuria, change in color of urine, no urgency or frequency.  No flank pain, no hematuria   MS:  No joint pain or swelling.  No decreased range of motion.  No back pain.  Psych:  No change in mood or  affect. No depression or anxiety.  No memory loss.          Objective:   Physical Exam GEN: A/Ox3; pleasant , NAD, well nourished   HEENT:  [place]/AT,  EACs-clear, TMs-wnl, NOSE-clear, THROAT-clear, no lesions, no postnasal drip or exudate noted.  NECK:  Supple w/ fair ROM; no JVD; normal carotid impulses w/o bruits; no thyromegaly or nodules palpated; no lymphadenopathy.  RESP  Coarse BS w/ no wheezing  CARD:  RRR, no m/r/g  , no peripheral edema, pulses intact, no cyanosis or clubbing.  GI:   Soft & nt; nml bowel sounds; no organomegaly or masses detected.  Musco: Warm bil, no deformities or joint swelling noted.   Neuro: alert, no focal deficits noted.    Skin: Warm, no lesions or rashes         Assessment & Plan:

## 2010-09-09 NOTE — Patient Instructions (Signed)
Continue on same regimen.  Taper steroids down 10mg  daily  follow up Dr. [name]  In 3-4 weeks  And As needed

## 2010-09-09 NOTE — Assessment & Plan Note (Signed)
Recent exacerbation with hospitalization  Now improved  Plan:  Taper steroids to 10mg  and hold.  follow up Dr. [name]  In 3-4 weeks and As needed

## 2010-09-09 NOTE — Progress Notes (Signed)
I agree

## 2010-09-10 LAB — COMPREHENSIVE METABOLIC PANEL
ALT: 23 U/L (ref 0–35)
ALT: 34 U/L (ref 0–35)
ALT: 51 U/L — ABNORMAL HIGH (ref 0–35)
ALT: 53 U/L — ABNORMAL HIGH (ref 0–35)
ALT: 60 U/L — ABNORMAL HIGH (ref 0–35)
AST: 25 U/L (ref 0–37)
AST: 26 U/L (ref 0–37)
AST: 38 U/L — ABNORMAL HIGH (ref 0–37)
Albumin: 1.2 g/dL — ABNORMAL LOW (ref 3.5–5.2)
Albumin: 1.2 g/dL — ABNORMAL LOW (ref 3.5–5.2)
Albumin: 1.3 g/dL — ABNORMAL LOW (ref 3.5–5.2)
Albumin: 1.3 g/dL — ABNORMAL LOW (ref 3.5–5.2)
Alkaline Phosphatase: 104 U/L (ref 39–117)
Alkaline Phosphatase: 90 U/L (ref 39–117)
Alkaline Phosphatase: 95 U/L (ref 39–117)
BUN: 47 mg/dL — ABNORMAL HIGH (ref 6–23)
BUN: 57 mg/dL — ABNORMAL HIGH (ref 6–23)
BUN: 62 mg/dL — ABNORMAL HIGH (ref 6–23)
BUN: 68 mg/dL — ABNORMAL HIGH (ref 6–23)
BUN: 81 mg/dL — ABNORMAL HIGH (ref 6–23)
CO2: 27 mEq/L (ref 19–32)
CO2: 30 mEq/L (ref 19–32)
Calcium: 8.3 mg/dL — ABNORMAL LOW (ref 8.4–10.5)
Calcium: 8.4 mg/dL (ref 8.4–10.5)
Calcium: 8.5 mg/dL (ref 8.4–10.5)
Calcium: 8.8 mg/dL (ref 8.4–10.5)
Calcium: 9.1 mg/dL (ref 8.4–10.5)
Chloride: 108 mEq/L (ref 96–112)
Chloride: 108 mEq/L (ref 96–112)
Chloride: 109 mEq/L (ref 96–112)
Creatinine, Ser: 1.61 mg/dL — ABNORMAL HIGH (ref 0.4–1.2)
Creatinine, Ser: 1.69 mg/dL — ABNORMAL HIGH (ref 0.4–1.2)
Creatinine, Ser: 1.7 mg/dL — ABNORMAL HIGH (ref 0.4–1.2)
Creatinine, Ser: 1.74 mg/dL — ABNORMAL HIGH (ref 0.4–1.2)
Creatinine, Ser: 3.13 mg/dL — ABNORMAL HIGH (ref 0.4–1.2)
GFR calc Af Amer: 18 mL/min — ABNORMAL LOW (ref >60)
GFR calc Af Amer: 18 mL/min — ABNORMAL LOW (ref >60)
GFR calc Af Amer: 36 mL/min — ABNORMAL LOW (ref >60)
GFR calc Af Amer: 36 mL/min — ABNORMAL LOW (ref >60)
GFR calc non Af Amer: 30 mL/min — ABNORMAL LOW (ref >60)
GFR calc non Af Amer: 31 mL/min — ABNORMAL LOW (ref >60)
GFR calc non Af Amer: 33 mL/min — ABNORMAL LOW (ref >60)
Glucose, Bld: 116 mg/dL — ABNORMAL HIGH (ref 70–99)
Glucose, Bld: 121 mg/dL — ABNORMAL HIGH (ref 70–99)
Glucose, Bld: 124 mg/dL — ABNORMAL HIGH (ref 70–99)
Glucose, Bld: 94 mg/dL (ref 70–99)
Potassium: 3.4 mEq/L — ABNORMAL LOW (ref 3.5–5.1)
Potassium: 3.7 mEq/L (ref 3.5–5.1)
Potassium: 4 mEq/L (ref 3.5–5.1)
Potassium: 4.2 mEq/L (ref 3.5–5.1)
Sodium: 139 mEq/L (ref 135–145)
Sodium: 141 mEq/L (ref 135–145)
Sodium: 142 mEq/L (ref 135–145)
Sodium: 146 mEq/L — ABNORMAL HIGH (ref 135–145)
Total Bilirubin: 0.7 mg/dL (ref 0.3–1.2)
Total Bilirubin: 0.7 mg/dL (ref 0.3–1.2)
Total Bilirubin: 0.8 mg/dL (ref 0.3–1.2)
Total Bilirubin: 0.8 mg/dL (ref 0.3–1.2)
Total Protein: 4.7 g/dL — ABNORMAL LOW (ref 6.0–8.3)
Total Protein: 5.3 g/dL — ABNORMAL LOW (ref 6.0–8.3)
Total Protein: 5.6 g/dL — ABNORMAL LOW (ref 6.0–8.3)
Total Protein: 5.9 g/dL — ABNORMAL LOW (ref 6.0–8.3)
Total Protein: 6.2 g/dL (ref 6.0–8.3)

## 2010-09-10 LAB — CBC
HCT: 20.8 % — ABNORMAL LOW (ref 36.0–46.0)
HCT: 21.1 % — ABNORMAL LOW (ref 36.0–46.0)
HCT: 21.4 % — ABNORMAL LOW (ref 36.0–46.0)
HCT: 22.7 % — ABNORMAL LOW (ref 36.0–46.0)
HCT: 23.2 % — ABNORMAL LOW (ref 36.0–46.0)
HCT: 24.6 % — ABNORMAL LOW (ref 36.0–46.0)
HCT: 26.3 % — ABNORMAL LOW (ref 36.0–46.0)
HCT: 27.2 % — ABNORMAL LOW (ref 36.0–46.0)
HCT: 27.3 % — ABNORMAL LOW (ref 36.0–46.0)
HCT: 23.9 % — ABNORMAL LOW (ref 36.0–46.0)
HCT: 24.2 % — ABNORMAL LOW (ref 36.0–46.0)
HCT: 24.8 % — ABNORMAL LOW (ref 36.0–46.0)
HCT: 25.3 % — ABNORMAL LOW (ref 36.0–46.0)
Hemoglobin: 6.9 g/dL — CL (ref 12.0–15.0)
Hemoglobin: 7 g/dL — CL (ref 12.0–15.0)
Hemoglobin: 7.1 g/dL — CL (ref 12.0–15.0)
Hemoglobin: 7.2 g/dL — CL (ref 12.0–15.0)
Hemoglobin: 7.5 g/dL — CL (ref 12.0–15.0)
Hemoglobin: 7.9 g/dL — CL (ref 12.0–15.0)
Hemoglobin: 8.2 g/dL — ABNORMAL LOW (ref 12.0–15.0)
Hemoglobin: 8.3 g/dL — ABNORMAL LOW (ref 12.0–15.0)
Hemoglobin: 8.5 g/dL — ABNORMAL LOW (ref 12.0–15.0)
Hemoglobin: 8.6 g/dL — ABNORMAL LOW (ref 12.0–15.0)
Hemoglobin: 8.8 g/dL — ABNORMAL LOW (ref 12.0–15.0)
Hemoglobin: 9 g/dL — ABNORMAL LOW (ref 12.0–15.0)
Hemoglobin: 9.3 g/dL — ABNORMAL LOW (ref 12.0–15.0)
Hemoglobin: 7.8 g/dL — CL (ref 12.0–15.0)
Hemoglobin: 7.8 g/dL — CL (ref 12.0–15.0)
Hemoglobin: 7.9 g/dL — CL (ref 12.0–15.0)
Hemoglobin: 8.1 g/dL — ABNORMAL LOW (ref 12.0–15.0)
Hemoglobin: 8.2 g/dL — ABNORMAL LOW (ref 12.0–15.0)
Hemoglobin: 8.3 g/dL — ABNORMAL LOW (ref 12.0–15.0)
MCHC: 32.4 g/dL (ref 30.0–36.0)
MCHC: 32.8 g/dL (ref 30.0–36.0)
MCHC: 32.8 g/dL (ref 30.0–36.0)
MCHC: 33 g/dL (ref 30.0–36.0)
MCHC: 33.1 g/dL (ref 30.0–36.0)
MCHC: 33.2 g/dL (ref 30.0–36.0)
MCHC: 33.4 g/dL (ref 30.0–36.0)
MCHC: 33.7 g/dL (ref 30.0–36.0)
MCHC: 32.6 g/dL (ref 30.0–36.0)
MCHC: 32.8 g/dL (ref 30.0–36.0)
MCHC: 33 g/dL (ref 30.0–36.0)
MCHC: 33.4 g/dL (ref 30.0–36.0)
MCV: 87.8 fL (ref 78.0–100.0)
MCV: 88.4 fL (ref 78.0–100.0)
MCV: 88.6 fL (ref 78.0–100.0)
MCV: 88.7 fL (ref 78.0–100.0)
MCV: 88.9 fL (ref 78.0–100.0)
MCV: 89.1 fL (ref 78.0–100.0)
MCV: 89.1 fL (ref 78.0–100.0)
MCV: 89 fL (ref 78.0–100.0)
MCV: 89.8 fL (ref 78.0–100.0)
MCV: 90.5 fL (ref 78.0–100.0)
MCV: 90.5 fL (ref 78.0–100.0)
Platelets: 142 10*3/uL — ABNORMAL LOW (ref 150–400)
Platelets: 157 10*3/uL (ref 150–400)
Platelets: 176 10*3/uL (ref 150–400)
Platelets: 90 10*3/uL — ABNORMAL LOW (ref 150–400)
Platelets: 95 10*3/uL — ABNORMAL LOW (ref 150–400)
Platelets: 335 10*3/uL (ref 150–400)
Platelets: 373 10*3/uL (ref 150–400)
RBC: 2.39 MIL/uL — ABNORMAL LOW (ref 3.87–5.11)
RBC: 2.42 MIL/uL — ABNORMAL LOW (ref 3.87–5.11)
RBC: 2.43 MIL/uL — ABNORMAL LOW (ref 3.87–5.11)
RBC: 2.87 MIL/uL — ABNORMAL LOW (ref 3.87–5.11)
RBC: 2.93 MIL/uL — ABNORMAL LOW (ref 3.87–5.11)
RBC: 2.96 MIL/uL — ABNORMAL LOW (ref 3.87–5.11)
RBC: 2.96 MIL/uL — ABNORMAL LOW (ref 3.87–5.11)
RBC: 2.98 MIL/uL — ABNORMAL LOW (ref 3.87–5.11)
RBC: 3.05 MIL/uL — ABNORMAL LOW (ref 3.87–5.11)
RBC: 3.17 MIL/uL — ABNORMAL LOW (ref 3.87–5.11)
RBC: 2.72 MIL/uL — ABNORMAL LOW (ref 3.87–5.11)
RBC: 2.74 MIL/uL — ABNORMAL LOW (ref 3.87–5.11)
RBC: 2.79 MIL/uL — ABNORMAL LOW (ref 3.87–5.11)
RBC: 3.03 MIL/uL — ABNORMAL LOW (ref 3.87–5.11)
RDW: 14.1 % (ref 11.5–15.5)
RDW: 14.3 % (ref 11.5–15.5)
RDW: 14.4 % (ref 11.5–15.5)
RDW: 14.5 % (ref 11.5–15.5)
RDW: 14.6 % (ref 11.5–15.5)
RDW: 14.7 % (ref 11.5–15.5)
RDW: 13.3 % (ref 11.5–15.5)
RDW: 13.8 % (ref 11.5–15.5)
RDW: 14.2 % (ref 11.5–15.5)
WBC: 14.4 10*3/uL — ABNORMAL HIGH (ref 4.0–10.5)
WBC: 15.2 10*3/uL — ABNORMAL HIGH (ref 4.0–10.5)
WBC: 17.1 10*3/uL — ABNORMAL HIGH (ref 4.0–10.5)
WBC: 17.2 10*3/uL — ABNORMAL HIGH (ref 4.0–10.5)
WBC: 19.5 10*3/uL — ABNORMAL HIGH (ref 4.0–10.5)
WBC: 19.7 10*3/uL — ABNORMAL HIGH (ref 4.0–10.5)
WBC: 21 10*3/uL — ABNORMAL HIGH (ref 4.0–10.5)
WBC: 21.2 10*3/uL — ABNORMAL HIGH (ref 4.0–10.5)
WBC: 9.9 10*3/uL (ref 4.0–10.5)
WBC: 12.5 10*3/uL — ABNORMAL HIGH (ref 4.0–10.5)
WBC: 8 10*3/uL (ref 4.0–10.5)
WBC: 9.1 10*3/uL (ref 4.0–10.5)
WBC: 14.7 10*3/uL — ABNORMAL HIGH (ref 4.0–10.5)

## 2010-09-10 LAB — GLUCOSE, CAPILLARY
Glucose-Capillary: 100 mg/dL — ABNORMAL HIGH (ref 70–99)
Glucose-Capillary: 100 mg/dL — ABNORMAL HIGH (ref 70–99)
Glucose-Capillary: 101 mg/dL — ABNORMAL HIGH (ref 70–99)
Glucose-Capillary: 104 mg/dL — ABNORMAL HIGH (ref 70–99)
Glucose-Capillary: 105 mg/dL — ABNORMAL HIGH (ref 70–99)
Glucose-Capillary: 106 mg/dL — ABNORMAL HIGH (ref 70–99)
Glucose-Capillary: 107 mg/dL — ABNORMAL HIGH (ref 70–99)
Glucose-Capillary: 108 mg/dL — ABNORMAL HIGH (ref 70–99)
Glucose-Capillary: 110 mg/dL — ABNORMAL HIGH (ref 70–99)
Glucose-Capillary: 110 mg/dL — ABNORMAL HIGH (ref 70–99)
Glucose-Capillary: 114 mg/dL — ABNORMAL HIGH (ref 70–99)
Glucose-Capillary: 116 mg/dL — ABNORMAL HIGH (ref 70–99)
Glucose-Capillary: 117 mg/dL — ABNORMAL HIGH (ref 70–99)
Glucose-Capillary: 118 mg/dL — ABNORMAL HIGH (ref 70–99)
Glucose-Capillary: 119 mg/dL — ABNORMAL HIGH (ref 70–99)
Glucose-Capillary: 121 mg/dL — ABNORMAL HIGH (ref 70–99)
Glucose-Capillary: 122 mg/dL — ABNORMAL HIGH (ref 70–99)
Glucose-Capillary: 126 mg/dL — ABNORMAL HIGH (ref 70–99)
Glucose-Capillary: 126 mg/dL — ABNORMAL HIGH (ref 70–99)
Glucose-Capillary: 127 mg/dL — ABNORMAL HIGH (ref 70–99)
Glucose-Capillary: 133 mg/dL — ABNORMAL HIGH (ref 70–99)
Glucose-Capillary: 134 mg/dL — ABNORMAL HIGH (ref 70–99)
Glucose-Capillary: 135 mg/dL — ABNORMAL HIGH (ref 70–99)
Glucose-Capillary: 139 mg/dL — ABNORMAL HIGH (ref 70–99)
Glucose-Capillary: 140 mg/dL — ABNORMAL HIGH (ref 70–99)
Glucose-Capillary: 146 mg/dL — ABNORMAL HIGH (ref 70–99)
Glucose-Capillary: 149 mg/dL — ABNORMAL HIGH (ref 70–99)
Glucose-Capillary: 159 mg/dL — ABNORMAL HIGH (ref 70–99)
Glucose-Capillary: 159 mg/dL — ABNORMAL HIGH (ref 70–99)
Glucose-Capillary: 160 mg/dL — ABNORMAL HIGH (ref 70–99)
Glucose-Capillary: 174 mg/dL — ABNORMAL HIGH (ref 70–99)
Glucose-Capillary: 52 mg/dL — ABNORMAL LOW (ref 70–99)
Glucose-Capillary: 64 mg/dL — ABNORMAL LOW (ref 70–99)
Glucose-Capillary: 66 mg/dL — ABNORMAL LOW (ref 70–99)
Glucose-Capillary: 67 mg/dL — ABNORMAL LOW (ref 70–99)
Glucose-Capillary: 70 mg/dL (ref 70–99)
Glucose-Capillary: 73 mg/dL (ref 70–99)
Glucose-Capillary: 80 mg/dL (ref 70–99)
Glucose-Capillary: 82 mg/dL (ref 70–99)
Glucose-Capillary: 83 mg/dL (ref 70–99)
Glucose-Capillary: 89 mg/dL (ref 70–99)
Glucose-Capillary: 91 mg/dL (ref 70–99)
Glucose-Capillary: 92 mg/dL (ref 70–99)
Glucose-Capillary: 108 mg/dL — ABNORMAL HIGH (ref 70–99)
Glucose-Capillary: 108 mg/dL — ABNORMAL HIGH (ref 70–99)
Glucose-Capillary: 108 mg/dL — ABNORMAL HIGH (ref 70–99)
Glucose-Capillary: 109 mg/dL — ABNORMAL HIGH (ref 70–99)
Glucose-Capillary: 110 mg/dL — ABNORMAL HIGH (ref 70–99)
Glucose-Capillary: 113 mg/dL — ABNORMAL HIGH (ref 70–99)
Glucose-Capillary: 114 mg/dL — ABNORMAL HIGH (ref 70–99)
Glucose-Capillary: 124 mg/dL — ABNORMAL HIGH (ref 70–99)
Glucose-Capillary: 138 mg/dL — ABNORMAL HIGH (ref 70–99)
Glucose-Capillary: 142 mg/dL — ABNORMAL HIGH (ref 70–99)
Glucose-Capillary: 48 mg/dL — ABNORMAL LOW (ref 70–99)
Glucose-Capillary: 52 mg/dL — ABNORMAL LOW (ref 70–99)
Glucose-Capillary: 63 mg/dL — ABNORMAL LOW (ref 70–99)
Glucose-Capillary: 73 mg/dL (ref 70–99)
Glucose-Capillary: 86 mg/dL (ref 70–99)
Glucose-Capillary: 89 mg/dL (ref 70–99)
Glucose-Capillary: 92 mg/dL (ref 70–99)
Glucose-Capillary: 95 mg/dL (ref 70–99)
Glucose-Capillary: 98 mg/dL (ref 70–99)
Glucose-Capillary: 98 mg/dL (ref 70–99)
Glucose-Capillary: 99 mg/dL (ref 70–99)

## 2010-09-10 LAB — CULTURE, BLOOD (ROUTINE X 2)
Culture: NO GROWTH
Culture: NO GROWTH

## 2010-09-10 LAB — SEDIMENTATION RATE: Sed Rate: 137 mm/hr — ABNORMAL HIGH (ref 0–22)

## 2010-09-10 LAB — BLOOD GAS, ARTERIAL
Acid-Base Excess: 0.2 mmol/L (ref 0.0–2.0)
Acid-Base Excess: 3.5 mmol/L — ABNORMAL HIGH (ref 0.0–2.0)
Acid-Base Excess: 3.5 mmol/L — ABNORMAL HIGH (ref 0.0–2.0)
Acid-Base Excess: 4.8 mmol/L — ABNORMAL HIGH (ref 0.0–2.0)
Acid-Base Excess: 5.2 mmol/L — ABNORMAL HIGH (ref 0.0–2.0)
Acid-Base Excess: 6 mmol/L — ABNORMAL HIGH (ref 0.0–2.0)
Bicarbonate: 25.4 mEq/L — ABNORMAL HIGH (ref 20.0–24.0)
Bicarbonate: 27.6 mEq/L — ABNORMAL HIGH (ref 20.0–24.0)
Bicarbonate: 28.3 mEq/L — ABNORMAL HIGH (ref 20.0–24.0)
Bicarbonate: 28.3 mEq/L — ABNORMAL HIGH (ref 20.0–24.0)
Bicarbonate: 28.7 mEq/L — ABNORMAL HIGH (ref 20.0–24.0)
Bicarbonate: 30.1 mEq/L — ABNORMAL HIGH (ref 20.0–24.0)
Bicarbonate: 30.2 mEq/L — ABNORMAL HIGH (ref 20.0–24.0)
Drawn by: 129801
Drawn by: 232811
Drawn by: 23604
Drawn by: 309681
FIO2: 0.3 %
FIO2: 0.3 %
FIO2: 0.3 %
FIO2: 0.4 %
FIO2: 0.4 %
FIO2: 0.5 %
FIO2: 0.5 %
MECHVT: 0.48 mL
MECHVT: 600 mL
MECHVT: 600 mL
MECHVT: 600 mL
MECHVT: 600 mL
O2 Saturation: 88.9 %
O2 Saturation: 89.1 %
O2 Saturation: 92.6 %
O2 Saturation: 93.4 %
O2 Saturation: 95.4 %
O2 Saturation: 96.1 %
O2 Saturation: 96.1 %
O2 Saturation: 96.2 %
PEEP: 5 cmH2O
PEEP: 8 cmH2O
PEEP: 8 cmH2O
Patient temperature: 98.2
Patient temperature: 98.6
Patient temperature: 98.6
Patient temperature: 98.6
Patient temperature: 98.6
RATE: 10 resp/min
RATE: 10 resp/min
RATE: 12 resp/min
RATE: 12 resp/min
RATE: 20 resp/min
TCO2: 25.6 mmol/L (ref 0–100)
TCO2: 26.7 mmol/L (ref 0–100)
TCO2: 26.9 mmol/L (ref 0–100)
TCO2: 27.1 mmol/L (ref 0–100)
TCO2: 27.2 mmol/L (ref 0–100)
TCO2: 27.3 mmol/L (ref 0–100)
TCO2: 27.4 mmol/L (ref 0–100)
TCO2: 29.2 mmol/L (ref 0–100)
pCO2 arterial: 39 mmHg (ref 35.0–45.0)
pCO2 arterial: 39.1 mmHg (ref 35.0–45.0)
pCO2 arterial: 45.9 mmHg — ABNORMAL HIGH (ref 35.0–45.0)
pCO2 arterial: 46.5 mmHg — ABNORMAL HIGH (ref 35.0–45.0)
pCO2 arterial: 65.8 mmHg (ref 35.0–45.0)
pH, Arterial: 7.401 — ABNORMAL HIGH (ref 7.350–7.400)
pH, Arterial: 7.46 — ABNORMAL HIGH (ref 7.350–7.400)
pH, Arterial: 7.479 — ABNORMAL HIGH (ref 7.350–7.400)
pO2, Arterial: 66 mmHg — ABNORMAL LOW (ref 80.0–100.0)
pO2, Arterial: 80.8 mmHg (ref 80.0–100.0)
pO2, Arterial: 85 mmHg (ref 80.0–100.0)

## 2010-09-10 LAB — BASIC METABOLIC PANEL
BUN: 105 mg/dL — ABNORMAL HIGH (ref 6–23)
BUN: 43 mg/dL — ABNORMAL HIGH (ref 6–23)
BUN: 67 mg/dL — ABNORMAL HIGH (ref 6–23)
BUN: 89 mg/dL — ABNORMAL HIGH (ref 6–23)
BUN: 13 mg/dL (ref 6–23)
BUN: 15 mg/dL (ref 6–23)
CO2: 28 mEq/L (ref 19–32)
CO2: 28 mEq/L (ref 19–32)
CO2: 29 mEq/L (ref 19–32)
CO2: 33 mEq/L — ABNORMAL HIGH (ref 19–32)
CO2: 27 mEq/L (ref 19–32)
CO2: 28 mEq/L (ref 19–32)
CO2: 28 mEq/L (ref 19–32)
CO2: 29 mEq/L (ref 19–32)
CO2: 26 mEq/L (ref 19–32)
Calcium: 8.2 mg/dL — ABNORMAL LOW (ref 8.4–10.5)
Calcium: 8.3 mg/dL — ABNORMAL LOW (ref 8.4–10.5)
Calcium: 8.3 mg/dL — ABNORMAL LOW (ref 8.4–10.5)
Calcium: 8.5 mg/dL (ref 8.4–10.5)
Calcium: 9.1 mg/dL (ref 8.4–10.5)
Calcium: 8 mg/dL — ABNORMAL LOW (ref 8.4–10.5)
Calcium: 8.1 mg/dL — ABNORMAL LOW (ref 8.4–10.5)
Calcium: 8.6 mg/dL (ref 8.4–10.5)
Calcium: 9.1 mg/dL (ref 8.4–10.5)
Chloride: 103 mEq/L (ref 96–112)
Chloride: 103 mEq/L (ref 96–112)
Chloride: 104 mEq/L (ref 96–112)
Chloride: 104 mEq/L (ref 96–112)
Chloride: 110 mEq/L (ref 96–112)
Chloride: 111 mEq/L (ref 96–112)
Chloride: 104 mEq/L (ref 96–112)
Chloride: 106 mEq/L (ref 96–112)
Chloride: 106 mEq/L (ref 96–112)
Chloride: 108 mEq/L (ref 96–112)
Creatinine, Ser: 1.56 mg/dL — ABNORMAL HIGH (ref 0.4–1.2)
Creatinine, Ser: 1.8 mg/dL — ABNORMAL HIGH (ref 0.4–1.2)
Creatinine, Ser: 2.43 mg/dL — ABNORMAL HIGH (ref 0.4–1.2)
Creatinine, Ser: 2.73 mg/dL — ABNORMAL HIGH (ref 0.4–1.2)
Creatinine, Ser: 1.05 mg/dL (ref 0.4–1.2)
Creatinine, Ser: 1.1 mg/dL (ref 0.4–1.2)
Creatinine, Ser: 1.19 mg/dL (ref 0.4–1.2)
Creatinine, Ser: 1.39 mg/dL — ABNORMAL HIGH (ref 0.4–1.2)
Creatinine, Ser: 1.05 mg/dL (ref 0.4–1.2)
GFR calc Af Amer: 22 mL/min — ABNORMAL LOW (ref >60)
GFR calc Af Amer: 25 mL/min — ABNORMAL LOW (ref >60)
GFR calc Af Amer: 25 mL/min — ABNORMAL LOW (ref >60)
GFR calc Af Amer: 31 mL/min — ABNORMAL LOW (ref >60)
GFR calc Af Amer: 35 mL/min — ABNORMAL LOW (ref >60)
GFR calc Af Amer: 43 mL/min — ABNORMAL LOW (ref >60)
GFR calc Af Amer: 47 mL/min — ABNORMAL LOW (ref >60)
GFR calc Af Amer: 51 mL/min — ABNORMAL LOW (ref >60)
GFR calc Af Amer: 55 mL/min — ABNORMAL LOW (ref >60)
GFR calc Af Amer: 56 mL/min — ABNORMAL LOW (ref >60)
GFR calc Af Amer: >60 mL/min (ref >60)
GFR calc Af Amer: >60 mL/min (ref >60)
GFR calc non Af Amer: 18 mL/min — ABNORMAL LOW (ref >60)
GFR calc non Af Amer: 21 mL/min — ABNORMAL LOW (ref >60)
GFR calc non Af Amer: 32 mL/min — ABNORMAL LOW (ref >60)
GFR calc non Af Amer: 39 mL/min — ABNORMAL LOW (ref >60)
GFR calc non Af Amer: 49 mL/min — ABNORMAL LOW (ref >60)
GFR calc non Af Amer: 54 mL/min — ABNORMAL LOW (ref >60)
GFR calc non Af Amer: 58 mL/min — ABNORMAL LOW (ref >60)
GFR calc non Af Amer: 54 mL/min — ABNORMAL LOW (ref >60)
Glucose, Bld: 134 mg/dL — ABNORMAL HIGH (ref 70–99)
Glucose, Bld: 91 mg/dL (ref 70–99)
Glucose, Bld: 94 mg/dL (ref 70–99)
Glucose, Bld: 95 mg/dL (ref 70–99)
Glucose, Bld: 100 mg/dL — ABNORMAL HIGH (ref 70–99)
Glucose, Bld: 102 mg/dL — ABNORMAL HIGH (ref 70–99)
Glucose, Bld: 84 mg/dL (ref 70–99)
Potassium: 3 mEq/L — ABNORMAL LOW (ref 3.5–5.1)
Potassium: 3.3 mEq/L — ABNORMAL LOW (ref 3.5–5.1)
Potassium: 3.5 mEq/L (ref 3.5–5.1)
Potassium: 3.6 mEq/L (ref 3.5–5.1)
Potassium: 4.1 mEq/L (ref 3.5–5.1)
Potassium: 3 mEq/L — ABNORMAL LOW (ref 3.5–5.1)
Potassium: 3.3 mEq/L — ABNORMAL LOW (ref 3.5–5.1)
Potassium: 3.3 mEq/L — ABNORMAL LOW (ref 3.5–5.1)
Potassium: 3.7 mEq/L (ref 3.5–5.1)
Potassium: 4.4 mEq/L (ref 3.5–5.1)
Sodium: 139 mEq/L (ref 135–145)
Sodium: 140 mEq/L (ref 135–145)
Sodium: 140 mEq/L (ref 135–145)
Sodium: 142 mEq/L (ref 135–145)
Sodium: 142 mEq/L (ref 135–145)
Sodium: 143 mEq/L (ref 135–145)
Sodium: 141 mEq/L (ref 135–145)
Sodium: 141 mEq/L (ref 135–145)
Sodium: 141 mEq/L (ref 135–145)
Sodium: 143 mEq/L (ref 135–145)
Sodium: 145 mEq/L (ref 135–145)
Sodium: 142 mEq/L (ref 135–145)

## 2010-09-10 LAB — PHOSPHORUS
Phosphorus: 1.9 mg/dL — ABNORMAL LOW (ref 2.3–4.6)
Phosphorus: 2.5 mg/dL (ref 2.3–4.6)
Phosphorus: 3.2 mg/dL (ref 2.3–4.6)

## 2010-09-10 LAB — CLOSTRIDIUM DIFFICILE EIA
C difficile Toxins A+B, EIA: NEGATIVE
C difficile Toxins A+B, EIA: NEGATIVE

## 2010-09-10 LAB — URINE CULTURE: Culture: NO GROWTH

## 2010-09-10 LAB — URINE MICROSCOPIC-ADD ON

## 2010-09-10 LAB — FERRITIN: Ferritin: 441 ng/mL — ABNORMAL HIGH (ref 10–291)

## 2010-09-10 LAB — HEPATIC FUNCTION PANEL
ALT: 43 U/L — ABNORMAL HIGH (ref 0–35)
Indirect Bilirubin: 0.5 mg/dL (ref 0.3–0.9)
Total Protein: 6.3 g/dL (ref 6.0–8.3)

## 2010-09-10 LAB — IRON AND TIBC
Iron: 17 ug/dL — ABNORMAL LOW (ref 42–135)
TIBC: 129 ug/dL — ABNORMAL LOW (ref 250–470)
UIBC: 112 ug/dL

## 2010-09-10 LAB — LACTATE DEHYDROGENASE: LDH: 165 U/L (ref 94–250)

## 2010-09-10 LAB — CULTURE, RESPIRATORY W GRAM STAIN

## 2010-09-10 LAB — PROTIME-INR
INR: 1.4 (ref 0.00–1.49)
Prothrombin Time: 17.3 seconds — ABNORMAL HIGH (ref 11.6–15.2)

## 2010-09-10 LAB — URINALYSIS, MICROSCOPIC ONLY
Nitrite: NEGATIVE
Protein, ur: 30 mg/dL — AB
Urobilinogen, UA: 0.2 mg/dL (ref 0.0–1.0)

## 2010-09-10 LAB — CROSSMATCH
Antibody Screen: NEGATIVE
Antibody Screen: NEGATIVE

## 2010-09-10 LAB — CULTURE, BAL-QUANTITATIVE W GRAM STAIN
Colony Count: 40000
Gram Stain: NONE SEEN

## 2010-09-10 LAB — APTT: aPTT: 30 seconds (ref 24–37)

## 2010-09-10 LAB — CARBOXYHEMOGLOBIN
Carboxyhemoglobin: 1.2 % (ref 0.5–1.5)
Methemoglobin: 1.4 % (ref 0.0–1.5)

## 2010-09-10 LAB — URINALYSIS, ROUTINE W REFLEX MICROSCOPIC
Nitrite: NEGATIVE
Protein, ur: NEGATIVE mg/dL
Specific Gravity, Urine: 1.009 (ref 1.005–1.030)
Urobilinogen, UA: 0.2 mg/dL (ref 0.0–1.0)

## 2010-09-10 LAB — HEMOCCULT GUIAC POC 1CARD (OFFICE): Fecal Occult Bld: NEGATIVE

## 2010-09-10 LAB — OCCULT BLOOD GASTRIC / DUODENUM (SPECIMEN CUP): Occult Blood, Gastric: POSITIVE — AB

## 2010-09-10 LAB — CREATININE, URINE, RANDOM: Creatinine, Urine: 57.7 mg/dL

## 2010-09-10 LAB — VITAMIN B12: Vitamin B-12: 452 pg/mL (ref 211–911)

## 2010-09-10 LAB — MAGNESIUM
Magnesium: 2.9 mg/dL — ABNORMAL HIGH (ref 1.5–2.5)
Magnesium: 1.6 mg/dL (ref 1.5–2.5)
Magnesium: 1.9 mg/dL (ref 1.5–2.5)

## 2010-09-10 LAB — AMYLASE: Amylase: 183 U/L — ABNORMAL HIGH (ref 27–131)

## 2010-09-10 LAB — DIFFERENTIAL
Basophils Relative: 0 % (ref 0–1)
Eosinophils Absolute: 0 10*3/uL (ref 0.0–0.7)
Lymphocytes Relative: 1 % — ABNORMAL LOW (ref 12–46)
Lymphs Abs: 0.2 10*3/uL — ABNORMAL LOW (ref 0.7–4.0)
Monocytes Absolute: 0.2 10*3/uL (ref 0.1–1.0)

## 2010-09-10 LAB — BRAIN NATRIURETIC PEPTIDE: Pro B Natriuretic peptide (BNP): 300 pg/mL — ABNORMAL HIGH (ref 0.0–100.0)

## 2010-09-10 LAB — SODIUM, URINE, RANDOM: Sodium, Ur: 9 mEq/L

## 2010-09-12 NOTE — Discharge Summary (Signed)
[name], [name]           ACCOUNT NO.:  [id number]  MEDICAL RECORD NO.:  [id number]           PATIENT TYPE:  I  LOCATION:  1303                         FACILITY:  [facility]  PHYSICIAN:  Altha Harm, MDDATE OF BIRTH:  [birth year]  DATE OF ADMISSION:  08/26/2010 DATE OF DISCHARGE:  08/31/2010                              DISCHARGE SUMMARY   DISCHARGE DISPOSITION:  Home.  FINAL DISCHARGE DIAGNOSES: 1. Acute exacerbation of chronic obstructive pulmonary disease, on     tapering steroids. 2. Acute-on-chronic hypoxic respiratory failure, acute component     resolved. 3. Hypertension. 4. Vaginal yeast infection present on admission. 5. 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 6. Gastroesophageal reflux disease.  SECONDARY DIAGNOSES: 1. History of esophageal stricture. 2. History of Crohn's disease status post a total colectomy with     ileostomy. 3. Tachyarrhythmia, heart rate presently controlled.  DISCHARGE MEDICATIONS: 1. Prednisone on a slow taper from 60 down to 10 and then to resume     her maintenance prednisone dosing.  Please see the medication     reconciliation. 2. Senokot two tablets p.o. daily as needed. 3. Accolate 20 mg p.o. b.i.d. 4. Bisoprolol 5 mg p.o. daily. 5. Celexa 20 mg p.o. daily. 6. Citrucel one tablet p.o. daily. 7. Crestor 10 mg p.o. q.h.s. 8. Diltiazem CD 180 mg p.o. t.i.d. 9. Hydrocodone 5/325 one to two tablets p.o. q.4 hours p.r.n. 10.Lasix 20 mg two tablets p.o. daily. 11.Magnesium oxide 250 mg p.o. daily. 12.Mucinex 600 mg one tablet in the morning, two tablets in the     evening and one tablet in the afternoon. 13.Multivitamin one tablet p.o. daily. 14.Prednisone 2.5 mg p.o. daily to be resumed after prednisone taper     completed. 15.Prilosec 40 mg p.o. b.i.d. 16.Proventil inhaler one to two puffs inhaled q.4 hours p.r.n. 17.Spiriva 18 mcg inhaled daily. 18.Victoza 1.8 mg injected intramuscularly every evening. 19.Vitamin C  one tablet b.i.d. as needed at the onset of an upper     respiratory infection. 20.Vitamin D3 1000 units p.o. daily. 21.Vitamin E 400 units p.o. daily. 22.Ambien CR 12.5 mg p.o. q.h.s.  CONSULTATIONS:  [name]. [name], MD, FCCP, Pulmonary Services.  PROCEDURES:  None.  DIAGNOSTIC STUDIES: 1. Two-view chest x-ray done on admission which shows no active     cardiopulmonary disease.  There are chronic changes.  There is     patchy sclerosis of the right humeral head.  Avascular necrosis is     not excluded. 2. Maxillofacial CT without contrast which shows negative CT of the     maxillofacial area.  There is no change from a prior study and the     sinuses maxillary, ethmoid, frontal and sphenoid sinuses are all     clear.  PRIMARY CARE PHYSICIAN:  [name], M.D.  PULMONOLOGIST:  Charlcie Cradle. [name], MD, FCCP  CODE STATUS:  Full Code.  ALLERGIES:  SURGICAL GELS and NEXIUM.  CHIEF COMPLAINT:  Shortness of breath and difficulty breathing.  HISTORY OF PRESENT ILLNESS:  Please refer to the H and P by Dr. Butler Denmark for details of the HPI.  However, in short this is a 60 year old  female with Gold stage IV COPD who presents to the emergency room with complaints of shortness of breath.  The patient has stated that about a week and a half prior to presentation she was having shortness of breath with cough.  She was placed on a 5-day course of Avelox, which she completed 3 days prior to admission.  The cough and mucus improved, but had not resolved.  The patient continued to have shortness of breath, significant wheezing and dyspnea on exertion.  HOSPITAL COURSE: 1. Acute exacerbation of COPD.  In a convalescent stage this patient     is a Gold stage IV COPD.  She is oxygen-dependent, however, is not     compliant with her O2 use.  The patient presented with significant     difficulty with breathing and increased work of breathing.  She     also had significant wheezing.  She was  started on IV Solu-Medrol,     placed on her maintenance medications and short-acting rescue     medications with nebulized albuterol and Atrovent.  The patient had     a progressive improvement in her clinical condition.  Dr. [name] from     Pulmonology saw the patient, given the fact that the patient is a     longstanding patient of the Pulmonary Services.  He agreed with the     management and his only consideration was for sinus infection.  A     CT maxillofacial was performed, which shows no sinusitis.  The     patient was also treated with an additional course of Avelox, which     she has completed.  Presently the patient is on a slow taper of     prednisone going down by 10 mg over every 3 days.  I have asked the     patient to see Dr. [name] in the office in about a week and a half     in the midst of the prednisone taper in the event that he wants to     make some changes based upon her progress and her clinical     condition at that time.  The patient will not be discharged on any     antibiotics, as at this time I do not feel that there is a need for     any.  Ms. Mcphail admitted that she has been ambulating in the    community without her oxygen, as she feels that she needs to take a     holiday from the oxygen use.  I have had long discussions with Ms.     Mcnorton about the effects of not using her oxygen including     pulmonary hypertension, cor pulmonale and up to death.  She appears     to have an understanding of it, however, I do believe that Ms.     Tunnell is in some significant denial about her condition and needs     reinforcement from all clinicians involved about the importance of     maintaining her oxygen use in order to extend her life. 2. Vaginal yeast infection.  The patient was diagnosed with a vaginal     yeast infection by her OB/GYN prior to admission.  Here in the     hospital she was treated for 4 days with Diflucan and presently she     has no vaginal  discharge.  The patient does still have her  prescription from her OB/GYN which she did not fill and given her     prednisone use it is very likely that she will develop another     yeast infection.  I have instructed the patient that if she does     see the same type of vaginal discharge, she can speak with her     OB/GYN about filling that prescription and taking it empirically to     treat the yeast infection. 3. Diabetes type 2.  The patient is on chronic steroid use and it is     likely that this is steroid-induced.  The patient is on Victoza and     will continue on that.  During her hospitalization she was     maintained on sliding scale insulin with blood sugars ranging in     the high 90s to less than 150. 4. Hypertension.  The patient's blood pressure is well-controlled on     her medications. 5. Tachyarrhythmia.  This diagnosis was present on admission.     However, the patient is on Cardizem and her heart rate is     controlled at less than 90.  Otherwise the patient has remained     stable.  She is ambulatory with her oxygen without any difficulty.  CONDITION ON DISCHARGE:  Condition at the time of discharge is stable.  PHYSICAL EXAMINATION:  VITAL SIGNS:  Her temperature is 98.3, heart rate 82, respiratory rate 18, O2 saturations are 97% on 1.5 liters.  Blood pressure 112/67.  However, please note that when the patient starts having conversation without her oxygen, her O2 saturations drop down to the low 80s. HEENT EXAMINATION:  She is normocephalic, atraumatic.  Pupils equally round and reactive to light and accommodation.  Extraocular movements are intact.  Oropharynx is moist.  No exudate, erythema or lesions are noted. NECK EXAMINATION:  Trachea is midline.  No masses.  No thyromegaly.  No JVD.  No carotid bruit. RESPIRATORY EXAMINATION:  The patient has a normal respiratory effort. She occasionally has some mild use of accessory muscles, however, this is her  baseline.  She has no wheezing or rhonchi present at this moment and she has good air entry noted on examination. CARDIOVASCULAR:  She has a normal S1 and S2.  No murmurs, rubs or gallops noted.  PMI is nondisplaced.  No heaves or thrills on palpation. ABDOMEN:  Obese, soft, nontender, nondistended.  No masses.  No hepatosplenomegaly.  Ileostomy is in place. EXTREMITIES:  Show no clubbing, cyanosis or edema. LYMPH NODE SURVEY:  She has no cervical, axillary, or inguinal lymphadenopathy noted. NEUROLOGICAL:  The patient has no focal neurological deficits.  Cranial nerves II-XII are grossly intact. PSYCHIATRIC:  She is alert and oriented x3.  Good insight and cognition. Good recent and remote recall.  DIETARY RESTRICTIONS:  The patient should be on a carbohydrate- controlled, heart-healthy diet.  PHYSICAL RESTRICTIONS:  The patient's activity should be as tolerated. However, she should have her oxygen on around-the-clock and I have had a lengthy discussion about the patient regarding this.  Total time for the discharge process including face-to-face time approximately 40 minutes.  POST HOSPITAL FOLLOWUP:  The patient is to follow up with her primary care physician, Dr. Dorothyann Peng, in 1 week and she is to follow up with Dr. Shan Levans in his office in about a week and a half.     Altha Harm, MD   MAM/MEDQ  D:  08/31/2010  T:  08/31/2010  Job:  [id number]  cc:   Candyce Churn. Allyne Gee, M.D. Fax: [phone]  [name]. [name], MD, FCCP [address] [place] [address]  Charlcie Cradle. [name], MD, FCCP [address] [place] [address]  Electronically Signed by Marthann Schiller MD on 09/12/2010 07:52:48 AM

## 2010-09-19 ENCOUNTER — Other Ambulatory Visit: Payer: Self-pay | Admitting: Critical Care Medicine

## 2010-09-26 ENCOUNTER — Other Ambulatory Visit: Payer: Self-pay | Admitting: Adult Health

## 2010-09-26 MED ORDER — ZAFIRLUKAST 20 MG PO TABS: 10.0000 mg | ORAL_TABLET | Freq: Two times a day (BID) | ORAL | Status: DC

## 2010-09-26 NOTE — Progress Notes (Signed)
Received patient assistance form from [facility] about pt's accolate.  A printed script is needed with TP's signature.  Will print rx and have TP sign when she returns to the office tomorrow.

## 2010-10-02 ENCOUNTER — Encounter: Payer: Self-pay | Admitting: Critical Care Medicine

## 2010-10-02 ENCOUNTER — Ambulatory Visit (INDEPENDENT_AMBULATORY_CARE_PROVIDER_SITE_OTHER): Payer: BLUE CROSS/BLUE SHIELD | Admitting: Critical Care Medicine

## 2010-10-02 DIAGNOSIS — J4489 Other specified chronic obstructive pulmonary disease: Secondary | ICD-10-CM

## 2010-10-02 DIAGNOSIS — J449 Chronic obstructive pulmonary disease, unspecified: Secondary | ICD-10-CM

## 2010-10-02 NOTE — H&P (Signed)
[name], [name]           ACCOUNT NO.:  [id number]  MEDICAL RECORD NO.:  [id number]           PATIENT TYPE:  E  LOCATION:  WLED                         FACILITY:  [facility]  PHYSICIAN:  Calvert Cantor, M.D.     DATE OF BIRTH:  1951-02-05  DATE OF ADMISSION:  08/26/2010 DATE OF DISCHARGE:                             HISTORY & PHYSICAL   PRIMARY CARE PHYSICIAN:  [name], MD  PULMONOLOGIST:  Charlcie Cradle. [name], MD, FCCP  PRESENTING COMPLAINT:  Shortness of breath.  HISTORY OF PRESENT ILLNESS:  This is a 60 year old female with COPD, who is status post total colectomy and ileostomy for Crohn disease.  The patient is on oxygen at home.  About a week and half ago, she started getting shortness of breath and cough.  She was placed on a 5-day course of Avelox, which was completed 3 days ago.  She states that her cough and mucus has improved, but has not resolved.  Her shortness of breath persists and she is unable to take the short walk to the bathroom.  She was noted to have wheezing in the ER and was given continuous nebulizer treatment.  After this treatment, she continues to have wheezing and rhonchi and complaint of shortness of breath and cough.  She is therefore being admitted.  The patient does not complain of any chest pain or fevers or chills.  Mucus was initially clear, but after the neb treatment, she had a small amount of yellowish mucus.  PAST MEDICAL HISTORY: 1. COPD. 2. Crohn disease status post total colectomy and ileostomy. 3. Vent-dependent respiratory failure about 2 years ago, for which she     eventually required a tracheostomy.  This was secondary to cavitary     pneumonia. 4. 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 5. Hypertension. 6. Gastroesophageal reflux disease. 7. History of esophageal stricture status post dilatation. 8. Total abdominal hysterectomy. 9. Some sort of tachycardia, for which she saw Dr. Elsie Lincoln from      Cardiology.  ALLERGIES:  No known drug allergies.  MEDICATIONS:  List brought in by the patient, 1. Furosemide 20 mg two times a day as needed. 2. Prednisone 5 mg tabs 1/2 tablet daily. 3. Cardizem CD 180 mg three times a day. 4. Omeprazole 40 mg twice a day. 5. Proventil inhaler 1-2 puffs q.4-6 h as needed. 6. Reclast IV infusion yearly. 7. Vitamin D 1000 units daily. 8. Crestor 10 mg at bedtime. 9. Zolpidem CR 6.25 mg 2 tablets at bedtime. 10.Bisoprolol 5 mg daily. 11.Hydrocodone/acetaminophen 5/500 one to two tablets every 6 hours as     needed.  The patient states that she only takes 1 or 2 tablets a     day.  She did run out of these a few days ago. 12.Victoza 18 mg per 3 mL solution once a day. 13.Vitamin E 400 units daily. 14.Multivitamin 1 tablet daily. 15.Vitamin C 1000 mg 2 tablets daily. 16.Spiriva 18 mcg 2 puffs daily. 17.Accolate 20 mg twice a day. 18.Citalopram 20 mg daily. 19.Mucinex extended release 12-hour tabs 600 mg four times a day. 20.Oxygen 1.5-2 liters daily. 21.Magnesium 250 mg daily. 22.Citracal Plus  1 tablet twice a day. 23.Aspirin 81 mg as needed.  FAMILY HISTORY:  There is a history of diabetes on the maternal side of her family.  SOCIAL HISTORY:  The patient smoked about half a pack a day since age 80.  She stopped 5-6 years ago.  She is single.  She lives with her sister.  She drinks alcohol occasionally.  Does not use any drugs.  REVIEW OF SYSTEMS:  She admits to a 30-pound weight loss in the past year.  Initially, she was attempting to lose weight because of the diabetes, but the last 15 pounds she states has come off on their own. She does not have a poor appetite.  HEENT:  No frequent headaches.  No blurred vision or double vision.  No sore throat, sinus trouble, or earache.  RESPIRATORY:  Positive for shortness of breath, wheezing, and productive cough as mentioned in H and P.  CARDIAC:  No chest pain or palpitations.  She does have  pedal edema if she does not use her Lasix. GI:  She had some vomiting a few days ago, but this is not common for her.  No history of abdominal pain.  Her ileostomy has not been giving her any problems.  GU:  No dysuria or hematuria.  HEMATOLOGIC:  Bruises easily.  SKIN:  No rash.  Musculoskeletal:  Has pain in her right shoulder and arm.  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  PSYCHOLOGIC:  She has some depression, but no anxiety.  PHYSICAL EXAM:  GENERAL:  Middle-aged female sitting up in bed, in no acute distress.  I noted her walking back from the bathroom and she was dyspneic and needed to pause to take some breaths. VITAL SIGNS:  Blood pressure is 110/77, pulse 91, respiratory rate 20, temperature 98.1, oxygen saturation is 100% on 2 liters. HEENT:  Pupils equal, round, reactive to light.  Extraocular movements are intact.  Conjunctivae is pink.  No scleral icterus.  Oral mucosa is dry.  Oropharynx clear. NECK:  Supple.  No thyromegaly, lymphadenopathy, or carotid bruits. HEART:  Regular rate and rhythm, tachycardic.  No murmurs, rubs, or gallops. LUNGS:  Wheezing bilaterally.  Mild rhonchi.  No crackles.  She is slightly tachypneic.  No use of accessory muscles. ABDOMEN:  Soft, nontender, nondistended.  She has a periumbilical hernia and an ileostomy.  Bowel sounds positive. EXTREMITIES:  No cyanosis, clubbing, or edema.  Pedal pulses positive. NEUROLOGIC:  Cranial nerves II through XII intact.  Able to move all 4 extremities appropriately. PSYCHOLOGIC:  Awake, alert, oriented x3.  Mood and affect normal. SKIN:  Warm and dry.  No rash or bruising.  LABORATORY DATA:  Pertinent blood work, hemoglobin is 9.8, hematocrit 31.8.  Rest of her CBC is normal.  Metabolic panel is normal except for sugar of 139.  Chest x-ray does not show any active cardiopulmonary disease.  There is a chronic scarring, especially in the right upper lobe.  There are  bronchitic changes.  There is patchy sclerosis on the right humeral head.  ASSESSMENT/PLAN: 1. Chronic obstructive pulmonary disease exacerbation.  She has been     given 60 mg of prednisone.  We will continue 40 mg daily.  She will     have nebulizer treatments routinely and p.r.n. as needed.  We will     continue her Mucinex.  We will continue oxygen to keep her     saturations 88-92%.  The ER doctor has requested a  pulmonary eval     as well.  They state that they will see her tomorrow.  At this     point, I will not start antibiotics as she just completed a course     of Avelox. 2. Diabetes mellitus.  Placed on sliding scale insulin. 3. Hypertension. 4. Anemia. 5. Ileostomy. 6. Gastroesophageal reflux disease. 7. Deep vein thrombosis prophylaxis with Lovenox.  The patient would     like to be a full code.  Time on admission was 50 minutes.     Calvert Cantor, M.D.     SR/MEDQ  D:  08/26/2010  T:  08/26/2010  Job:  [id number]  cc:   Candyce Churn. Allyne Gee, M.D. Fax: [phone]  Electronically Signed by Calvert Cantor M.D. on 10/02/2010 10:55:26 PM

## 2010-10-02 NOTE — Patient Instructions (Signed)
No change in medications. Return in        2 months 

## 2010-10-02 NOTE — Progress Notes (Signed)
Subjective:    Patient ID: [name], female    DOB: 1951/02/26, 60 y.o.   MRN: [id number]  HPI 60 y.o.AAF 10/02/2010 Pt was just in the hospital.   Not wearing oxygen ??  Pt states she is more compliant.  4/2- 4/7  Copd exac. Not sleeping. Takes several hours to fall asleep.  Only a sl cough now.  No real wheeze.  Dyspnea is back to baseline. Pt denies any significant sore throat, nasal congestion or excess secretions, fever, chills, sweats, unintended weight loss, pleurtic or exertional chest pain, orthopnea PND, or leg swelling Pt denies any increase in rescue therapy over baseline, denies waking up needing it or having any early am or nocturnal exacerbations of coughing/wheezing/or dyspnea. Pt also denies any obvious fluctuation in symptoms with  weather or environmental change or other alleviating or aggravating factors    Past Medical History  Diagnosis Date  . Other diseases of vocal cords   . Osteoporosis, unspecified   . Other and unspecified hyperlipidemia   . Esophageal reflux   . Chronic airway obstruction, not elsewhere classified   . Regional enteritis of unspecified site   . 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 Type II or unspecified type diabetes mellitus without mention of complication, not stated as uncontrolled      History reviewed. No pertinent family history.   History   Social History  . Marital Status: Divorced    Spouse Name: N/A    Number of Children: N/A  . Years of Education: N/A   Occupational History  . Not on file.   Social History Main Topics  . Smoking status: Former Smoker -- 1.0 packs/day for 32 years    Types: Cigarettes    Quit date: 05/27/2003  . Smokeless tobacco: Never Used  . Alcohol Use: Yes  . Drug Use: Not on file  . Sexually Active: Not on file   Other Topics Concern  . Not on file   Social History Narrative  . No narrative on file     Allergies  Allergen Reactions  . Esomeprazole  Magnesium      Outpatient Prescriptions Prior to Visit  Medication Sig Dispense Refill  . albuterol (PROVENTIL HFA) 108 (90 BASE) MCG/ACT inhaler Inhale 2 puffs into the lungs every 6 (six) hours as needed.        . Ascorbic Acid (VITAMIN C) 1000 MG tablet Take 1,000 mg by mouth daily.        . B-D ULTRAFINE III SHORT PEN 31G X 8 MM MISC       . bisoprolol (ZEBETA) 5 MG tablet Take 5 mg by mouth daily.        . Cholecalciferol (VITAMIN D) 1000 UNITS capsule Take 1,000 Units by mouth daily.        . citalopram (CELEXA) 20 MG tablet Take 20 mg by mouth daily.        [name] diltiazem (CARDIZEM CD) 180 MG 24 hr capsule Take 180 mg by mouth 3 (three) times daily.       . furosemide (LASIX) 20 MG tablet Take 40 mg by mouth daily.       [name] guaiFENesin (MUCINEX) 600 MG 12 hr tablet 1 tablet in the am, 2 tablet at lunch, 1 tablet in the evening.      [name] HYDROcodone-acetaminophen (VICODIN) 5-500 MG per tablet Take 1 tablet by mouth every 6 (six) hours as needed.        . Liraglutide (VICTOZA) 18  MG/3ML SOLN 1.8 units once daily      . Magnesium 250 MG TABS Take 1 tablet by mouth daily.        . Multiple Minerals-Vitamins (CITRACAL PLUS) TABS Take 1 tablet by mouth 2 (two) times daily.       . Multiple Vitamin (MULTIVITAMIN) capsule Take 1 capsule by mouth daily.        [name] omeprazole (PRILOSEC) 40 MG capsule Take 40 mg by mouth 2 (two) times daily.       . predniSONE (DELTASONE) 5 MG tablet Take 5 mg by mouth daily.       . rosuvastatin (CRESTOR) 10 MG tablet Take 10 mg by mouth daily.        [name] tiotropium (SPIRIVA) 18 MCG inhalation capsule Place 18 mcg into inhaler and inhale daily.        . vitamin E 400 UNIT capsule Take 400 Units by mouth daily.        . zafirlukast (ACCOLATE) 20 MG tablet Take 0.5 tablets (10 mg total) by mouth 2 (two) times daily.  60 tablet  5  . zoledronic acid (RECLAST) 5 MG/100ML SOLN Inject 5 mg into the vein once.        [name] zolpidem (AMBIEN CR) 6.25 MG CR tablet Take 2 at bedtime        . aspirin 81 MG tablet Take 81 mg by mouth daily.           Review of Systems Constitutional:   No  weight loss, night sweats,  Fevers, chills, fatigue, lassitude. HEENT:   No headaches,  Difficulty swallowing,  Tooth/dental problems,  Sore throat,                No sneezing, itching, ear ache, nasal congestion, post nasal drip,   CV:  No chest pain,  Orthopnea, PND, swelling in lower extremities, anasarca, dizziness, palpitations  GI  No heartburn, indigestion, abdominal pain, nausea, vomiting, diarrhea, change in bowel habits, loss of appetite  Resp: Notes  shortness of breath with exertion   Not  at rest.  No excess mucus, no productive cough,  No non-productive cough,  No coughing up of blood.  No change in color of mucus.  No wheezing.  No chest wall deformity  Skin: no rash or lesions.  GU: no dysuria, change in color of urine, no urgency or frequency.  No flank pain.  MS:  No joint pain or swelling.  No decreased range of motion.  No back pain.  Psych:  No change in mood or affect. No depression or anxiety.  No memory loss.  Pt is not sleeping well     Objective:   Physical Exam Filed Vitals:   10/02/10 1107  BP: 110/80  Pulse: 82  Temp: 98.9 F (37.2 C)  Height: 5\' 1"  (1.549 m)  Weight: 130 lb (58.968 kg)  SpO2: 97%    Gen: anxious  in no distress,   ENT: No lesions,  mouth clear,  oropharynx clear, no postnasal drip  Neck: No JVD, no TMG, no carotid bruits  Lungs: No use of accessory muscles, no dullness to percussion, distant BS, poor airflow, no active wheezing  Cardiovascular: RRR, heart sounds normal, no murmur or gallops, no peripheral edema  Abdomen: soft and NT, no HSM,  BS normal  Musculoskeletal: No deformities, no cyanosis or clubbing  Neuro: alert, non focal  Skin: Warm, no lesions or rashes        Assessment & Plan:  COPD Severe COPD with oxygen dependent, steroid dependent  Pt instructed to be more compliant with oxygen Pt  now back to baseline dose of prednisone No additional changes in meds needed Return 2 months     Updated Medication List Outpatient Encounter Prescriptions as of 10/02/2010  Medication Sig Dispense Refill  . albuterol (PROVENTIL HFA) 108 (90 BASE) MCG/ACT inhaler Inhale 2 puffs into the lungs every 6 (six) hours as needed.        . Ascorbic Acid (VITAMIN C) 1000 MG tablet Take 1,000 mg by mouth daily.        . B-D ULTRAFINE III SHORT PEN 31G X 8 MM MISC       . bisoprolol (ZEBETA) 5 MG tablet Take 5 mg by mouth daily.        . Cholecalciferol (VITAMIN D) 1000 UNITS capsule Take 1,000 Units by mouth daily.        . citalopram (CELEXA) 20 MG tablet Take 20 mg by mouth daily.        [name] diltiazem (CARDIZEM CD) 180 MG 24 hr capsule Take 180 mg by mouth 3 (three) times daily.       . furosemide (LASIX) 20 MG tablet Take 40 mg by mouth daily.       [name] guaiFENesin (MUCINEX) 600 MG 12 hr tablet 1 tablet in the am, 2 tablet at lunch, 1 tablet in the evening.      [name] HYDROcodone-acetaminophen (VICODIN) 5-500 MG per tablet Take 1 tablet by mouth every 6 (six) hours as needed.        . Liraglutide (VICTOZA) 18 MG/3ML SOLN 1.8 units once daily      . Magnesium 250 MG TABS Take 1 tablet by mouth daily.        . Multiple Minerals-Vitamins (CITRACAL PLUS) TABS Take 1 tablet by mouth 2 (two) times daily.       . Multiple Vitamin (MULTIVITAMIN) capsule Take 1 capsule by mouth daily.        [name] omeprazole (PRILOSEC) 40 MG capsule Take 40 mg by mouth 2 (two) times daily.       . predniSONE (DELTASONE) 5 MG tablet Take 5 mg by mouth daily.       . rosuvastatin (CRESTOR) 10 MG tablet Take 10 mg by mouth daily.        [name] tiotropium (SPIRIVA) 18 MCG inhalation capsule Place 18 mcg into inhaler and inhale daily.        . vitamin E 400 UNIT capsule Take 400 Units by mouth daily.        . zafirlukast (ACCOLATE) 20 MG tablet Take 0.5 tablets (10 mg total) by mouth 2 (two) times daily.  60 tablet  5  . zoledronic acid  (RECLAST) 5 MG/100ML SOLN Inject 5 mg into the vein once.        [name] zolpidem (AMBIEN CR) 6.25 MG CR tablet Take 2 at bedtime       . DISCONTD: aspirin 81 MG tablet Take 81 mg by mouth daily.

## 2010-10-03 NOTE — Assessment & Plan Note (Addendum)
Severe COPD with oxygen dependent, steroid dependent  Pt instructed to be more compliant with oxygen Pt now back to baseline dose of prednisone No additional changes in meds needed Return 2 months

## 2010-10-08 NOTE — H&P (Signed)
NAMESALIHAH, PECKHAM           ACCOUNT NO.:  [id number]   MEDICAL RECORD NO.:  [id number]          PATIENT TYPE:  INP   LOCATION:  0101                         FACILITY:  [facility]   PHYSICIAN:  Angelia Mould. Derrell Lolling, M.D.DATE OF BIRTH:  1951/04/05   DATE OF ADMISSION:  11/26/2007  DATE OF DISCHARGE:                              HISTORY & PHYSICAL   CHIEF COMPLAINT:  Right groin pain, vomiting.   HISTORY OF PRESENT ILLNESS:  This is a 60 year old black female with  oxygen-dependent steroid-dependent COPD.  She is in the emergency room  tonight and gives a 2-week history of right groin pain which is severe  and mild diffuse non-crampy abdominal discomfort.  She has continued to  be able to eat okay, but does vomit every 2-3 days just the food that  she ate.  She has a history of subtotal proctocolectomy with ileostomy  for Crohn's disease and her ileostomy has continued to function normally  with a normal volume of stool and flatus.   Over the past 2 weeks she has seen multiple doctors.  She has been in  the [facility] emergency room on June 22 for her right groin pain and was told  it was musculoskeletal and was given a prescription for hydrocodone.  She was seen by Dr. Eda Paschal last week, had an evaluation.  She was  seen by Dr. Regino Schultze, orthopedic surgeon, and was told it was sciatica,  and was given another prescription for hydrocodone a few days ago.  She  states that yesterday she had some vaginal bleeding and called Dr.  Eda Paschal.   She had a CT scan today, which was reportedly ordered by Dr. Eda Paschal.  This shows some dilated small bowel and a question of a transition zone  in the right lower quadrant under her ileostomy suggesting adhesions and  a partial small-bowel obstruction.  Also noted was a ventral hernia and  a small possible parastomal hernia.  There was no sign of any  inflammatory process or ischemia.   She was evaluated in the emergency department by their staff.  I  was  called to evaluate her.   At this time, the patient reports her biggest issue is the mildly severe  right groin pain radiating into her thigh and anxiety.   PAST HISTORY:  1. Severe COPD with a history of bilateral pneumonia.  She is oxygen      dependent and steroid-dependent, followed by Dr. Danise Mina.  2. Hypertension.  3. Gastroesophageal reflux disease with a history of esophageal      stricture and dilatations.  4. Anxiety disorder.  5. Status post total abdominal hysterectomy.  6. Status post some total proctocolectomy with ileostomy for Crohn      disease at [facility] in 2007.  7. History of deconditioning.  8. There is no history of diabetes or stroke or heart attack.   CURRENT MEDICATIONS:  1. Accolate 20 mg twice a day.  2. Advair 250/50 one inhalation twice a day.  3. Albuterol nebs four times a day.  4. Diltiazem 180 mg three times a day.  5. Lasix 20 mg  a day.  6. Lexapro 10 mg as needed.  7. Nadolol 20 mg daily.  8. Mucinex 1200 mg twice a day.  9. Omeprazole 20 mg daily.  10.Oxygen 2 liters.  11.Prednisone 5 mg a day.  12.Spiriva hand inhaler one inhalation a day.   She are also has recent prescriptions for:  1. Lyrica 75 mg b.i.d.  2. Meloxicam 7.5 mg daily.  3. Hydrocodone, she takes 8 tablets a day, she says.   DRUG ALLERGIES:  NEXIUM   SOCIAL HISTORY:  The patient is divorced, lives with her mother and  sister.  She says she has not smoked in 2-3 years.  She drinks alcohol  rarely.  She is unemployed.   FAMILY HISTORY:  Mother living, has arthritis and anemia and  hyperlipidemia.  Father deceased for COPD.   REVIEW OF SYSTEMS:  A 15-system review of systems is performed and is  noncontributory except as noted above.   PHYSICAL EXAMINATION:  GENERAL:  A short-statured black female in mild  distress.  She is very cooperative and friendly, but has intermittent  attacks of crying and tearfulness from anxiety.  VITAL SIGNS:  Temperature  97.9, blood pressure 163, pulse 93, respirations 20.  HEENT:  Eyes:  Sclerae clear.  Extraocular is intact.  Ears, Mouth,  Throat, Nose, Lips, Tongue and Oropharynx without gross lesions.  NECK:  No mass.  No jugular distention.  LUNGS:  Distant breath sounds.  No real rhonchi or wheezes noted.  No  chest wall tenderness.  HEART:  Regular rate and rhythm.  I do not hear a murmur.  Radial and  femoral pulses are palpable.  ABDOMEN:  Somewhat protuberant.  Bowel sounds are active.  She has a  midline scar.  She has an ileostomy of the right lower quadrant.  There  is some mild diffuse tenderness, but no guarding or rebound or signs of  peritonitis.  She has a ventral hernia in the midline which is soft and  nontender and easily reducible.  I was able to get her to soften up and  feel around the ileostomy.  I really do not feel any tenderness or mass  or hernia around the ileostomy.  There is lots of stool and flatus in  the ileostomy bag.  GENITOURINARY:  I have examined both groins very carefully.  I do not  feel an inguinal or femoral hernia.  EXTREMITIES:  She moves all four extremities well without pain or  deformity.  NEUROLOGIC:  No gross motor sensory deficits.   ADMISSION DATA:  CT scan as described above suggesting partial small-  bowel obstruction or ileus.  White blood cell count 10,200, hemoglobin  11.7, white blood cell count differential is normal.  Urinalysis is  unremarkable.  Sodium 137, potassium 4.3, BUN 16, creatinine 1.1,  glucose 137.   ASSESSMENT:  1. Partial small-bowel obstruction versus narcotic induced ileus.      Although the CT scan suggests obstruction as a primary etiology, it      is clear from her physical exam that she is most likely not      obstructed due to her hernia, and given the normal output of stool      or flatus per ileostomy, she does not have a high-grade      obstruction.  She will need to be admitted for further evaluation      and  management  2. Severe COPD, steroid and oxygen dependent  3. Hypertension.  4. Anxiety disorder.  5. Status post some total proctocolectomy and ileostomy for Crohn's      disease.  6. Status post total abdominal hysterectomy.  7. Gastroesophageal reflux disease with history of esophageal      stricture.   PLAN:  1. The patient will be admitted.  She will be placed at bowel rest      with and nasogastric tube inserted for nasogastric suction.  2. I have asked the [facility] hospitalist group to follow her with me to      manage her medical problems and make sure all of her medication are      taken care of.  3. We will repeat her lab work and x-rays in the morning of July 4.  4. Hopefully, this will resolve with expected management.  If it      progresses, she may need a laparotomy, but hopefully not.      Angelia Mould. Derrell Lolling, M.D.  Electronically Signed     HMI/MEDQ  D:  11/26/2007  T:  11/26/2007  Job:  [id number]   cc:   Candyce Churn. Allyne Gee, M.D.  Fax: [phone]   Charlcie Cradle [name], MD, FCCP  [address]  [place]  [place] [address]   Rande Brunt. Eda Paschal, M.D.  Fax: [phone]   Danise Edge, M.D.  Fax: [phone]

## 2010-10-08 NOTE — Op Note (Signed)
NAMEGISELLE, [name]           ACCOUNT NO.:  [id number]   MEDICAL RECORD NO.:  [id number]          PATIENT TYPE:  AMB   LOCATION:  ENDO                         FACILITY:  [facility]   PHYSICIAN:  Danise Edge, M.D.   DATE OF BIRTH:  1951-03-14   DATE OF PROCEDURE:  01/07/2007  DATE OF DISCHARGE:                               OPERATIVE REPORT   PROCEDURE INDICATION:  [name] is a 60 year old female  born 10/12/1950.  [name] has intermittent esophageal dysphagia.   CHRONIC MEDICATIONS:  Accolate, Advair, diltiazem, furosemide, Lexapro,  lorazepam, lovastatin, Mucinex, omeprazole, nasal oxygen, prednisone,  Spiriva, multivitamin. Xopenex, vitamin D.   PAST MEDICAL HISTORY:  1. Severe universal Crohn proctocolitis.  2. Proctocolectomy with ileostomy.  3. Severe chronic obstructive pulmonary disease.  4. Chronic prednisone use for COPD.  5. Total abdominal hysterectomy.  6. Gastroesophageal reflux disease,  7. Uncomplicated hypertension.  8. Elevated cholesterol.  9. Chronic anxiety.   ENDOSCOPIST:  Danise Edge, MD.   PREMEDICATION:  Intravenous propofol administered by Anesthesia.   PROCEDURE IN DETAIL:  [name] was placed in the left lateral  decubitus position.  For conscious sedation, she received intravenous  propofol administered by Anesthesia.  The Pentax gastroscope was passed  through the posterior hypopharynx into the proximal esophagus without  difficulty.   Esophagoscopy:  The proximal and mid segments of the esophageal mucosa  appear normal.  There is distal esophageal mucosal scarring which has an  estimated luminal diameter of approximately 15 mm.   Gastroscopy:  Retroflex view of the gastric cardia and fundus was  normal.  The gastric body, antrum and pylorus appeared normal.   Duodenoscopy:  The duodenal bulb and descending duodenum appeared  normal.   Esophageal Dilation:  Utilizing the [facility] [employer] CRE esophageal  balloon dilating system, a 15-mm, 16.5-mm and 18-mm CRE esophageal  balloon was inflated in the distal esophagus in the area of the  stricture without significant mucosal dilation, indicating the lack of  esophageal obstruction as a result of the stricture.   ASSESSMENT:  Normal esophagogastroduodenoscopy except for nonobstructing  scarring in the distal esophagus.  There is no endoscopic evidence for  the presence of erosive esophagitis or Barrett esophagus.   PLAN:  I suspect [name] has an esophageal motility disorder of the  esophagus causing her esophageal dysphagia.  She could not tolerate  esophageal motility testing.  She has to receive propofol anesthesia in  order to undergo esophagogastroduodenoscopy.   I do not have any solution for her swallowing difficulty other than  taking her time with eating.           ______________________________  Danise Edge, M.D.     MJ/MEDQ  D:  01/07/2007  T:  01/08/2007  Job:  [id number]   cc:   Charlcie Cradle. [name], MD, FCCP  [address]  [place]  [place] [address]

## 2010-10-08 NOTE — Assessment & Plan Note (Signed)
[facility]                             PULMONARY OFFICE NOTE   LILLIE, PORTNER                  MRN:          [id number]  DATE:01/27/2007                            DOB:          10/13/1950    Ms. Tristan returns in followup.  She is a 60 year old African-American  female, history of asthmatic bronchitis, chronic obstructive lung  disease, vocal cord dysfunction.  Overall, the patient's level of  dyspnea is stable.  She is having minimal cough in the morning, no real  chest discomfort.   1. Maintains Spiriva daily.  2. Advair one spray b.i.d. 250/50.  3. Accolate 20 mg b.i.d.  4. Oxygen 2 L continuous.  5. Xopenex by nebulization q.i.d.   EXAM:  Temperature 97, blood pressure 106/70, pulse 89, saturation 100%  on 2 L.  CHEST:  Showed diminished breath sounds without evidence of wheeze or  rhonchi.  CARDIAC EXAM:  Showed a regular rate and rhythm without S3, normal S1,  S2.  ABDOMEN:  Soft, nontender.  EXTREMITIES:  Showed no edema or clubbing.   IMPRESSION:  Impression is that of chronic obstructive lung disease with  vocal cord dysfunction syndrome, asthmatic bronchitis, COPD.   PLAN:  Plan is to maintain the medicines as currently dosed.  We will  see the patient back in return followup in three months.     Charlcie Cradle [name], MD, [facility]  Electronically Signed    PEW/MedQ  DD: 01/27/2007  DT: 01/27/2007  Job #: [id number]   cc:   Candyce Churn. Allyne Gee, M.D.

## 2010-10-08 NOTE — H&P (Signed)
[name], [name]           ACCOUNT NO.:  [id number]   MEDICAL RECORD NO.:  [id number]          PATIENT TYPE:  INP   LOCATION:  1228                         FACILITY:  [facility]   PHYSICIAN:  [name]. [name], MD, FCCPDATE OF BIRTH:  04[birth year]1950   DATE OF ADMISSION:  06/16/2008  DATE OF DISCHARGE:                              HISTORY & PHYSICAL   REASON FOR ADMISSION:  Acute respiratory failure resulting in ventilator  dependence.   HISTORY:  This is a 60 year old white female who carries a diagnosis of  severe COPD and is reportedly chronically steroid and oxygen dependent.  Her sister tells me that she has been trying to take herself off of  prednisone but has actually had now the third flare-up since presenting  to the office on December 11 after receiving a flu shot after which she  developed a hacking cough with brown sputum and was treated with a  course of prednisone and Avelox.  She had a second flare-up and was seen  on January 7 at the [facility] emergency room, again given a course of  prednisone and antibiotics, and then 1 day prior to admission began  developing increasing dyspnea.  Apparently this patient uses her  nebulizer not as a backup but on a perfectly regular basis around the  clock and when it failed to relieve her acute distress, came to the  emergency room where she was found to be in respiratory extremis and  required emergency but difficult intubation.  Pulmonary critical care  was asked to see her after the intubation.  On my arrival, she had  bloody secretions in the endotracheal tube and also in the mouth but no  obvious active bleeding.  There were no purulent secretions.  No  additional history was available from the patient and the sister was not  able to give any more history.  However, there was no apparent history  of any chest pain, fever, nausea, vomiting or leg swelling.   PAST MEDICAL HISTORY:  1. Significant for possible Crohn's disease status  post partial      colectomy.  2. Vocal cord dysfunction.  3. Osteoporosis.  4. Hyperlipidemia.  5. GERD.  6. COPD.   ALLERGIES:  None known.   MEDICATIONS:  Is supposed to include Advair, Accolate, prednisone daily,  Spiriva, albuterol q.i.d. p.r.n., not regularly, and omeprazole.  However, the intake from the emergency room shows the patient is on  nadolol 20 mg per day which is not occluded in the office inventory.   SOCIAL HISTORY:  The patient quit smoking 2-3 years ago.   FAMILY HISTORY:  Is significant for her mother having asthma.   REVIEW OF SYSTEMS:  Not obtainable at present.   PHYSICAL EXAMINATION:  This is an obese black female who is fighting a  bit against the ventilator, biting her endotracheal tube after  intubation.  There are bloody secretions as noted.  However, no active  bleeding is noted and there is no obvious nasal bleeding.  NECK:  Supple  without cervical adenopathy or tenderness.  Trachea is midline.  LUNG FIELDS:  Reveal pan expiratory wheeze  with marked increase in  expiratory time and air trapping present on the present ventilator  settings.  There is a regular rhythm without murmur, gallop rub.  S1, S2 were  diminished.  ABDOMEN:  Obese, benign with no organomegaly, masses or obvious  tenderness.  EXTREMITIES:  Warm without calf tenderness, cyanosis, clubbing.   Chest x-ray showed COPD changes with endotracheal tube near the carina  with a request that it be pulled back 4 cm already given verbally.  No  evidence of infiltrates or pneumothorax.   LABORATORY DATA:  Was remarkable for a nonfasting blood sugar of 178 and  elevated white count of 22,000 with a left shift.  Otherwise labs were  unremarkable.   IMPRESSION:  1. Chronic obstructive pulmonary disease with an apparent refractory      asthmatic component in a patient who previously was felt to be      chronically steroid dependent and has now been placed on relatively      high doses  of nonspecific beta blockers which will need to be      stopped, and treated as an asthma exacerbation with around-the-      clock nebulizers and steroids.  At this point she has had two      different rounds of antibiotics, has no infiltrates or purulent      sputum.  I am going to hold antibiotics for now despite the high      white count, which is probably just stress related.  2. Medication reconciliation will be a major challenge going forward      to make sure everyone is reading from the same page including the      patient and her family regarding medication administration at home.      Our records do not indicate that she was taking nadolol nor do they      indicate that she should be taking the albuterol round-the-clock      but rather p.r.n.  I believe this is a major misunderstanding on      her part and is printed for her on each office visit but is not      being followed between visits the way we intended.  In the future      if beta blockers are needed she should probably be on bisoprolol,      the most specific generic beta blocker on the market.      [name]. [name], MD, [facility]  Electronically Signed     MBW/MEDQ  D:  06/16/2008  T:  06/16/2008  Job:  [id number]   cc:   Candyce Churn. Allyne Gee, M.D.  Fax: [phone]   Madaline Savage, M.D.  Fax: [phone]

## 2010-10-08 NOTE — Assessment & Plan Note (Signed)
[facility]                             PULMONARY OFFICE NOTE   [name], [name]                  MRN:          [id number]  DATE:04/26/2007                            DOB:          Jan 17, 1951    [name] is a 60 year old African American female with a history of  chronic obstructive lung disease and was seen in the emergency room on  April 25, 2007 and given a course of Avelox. She has taken one dose,  feeling somewhat better today. Is here for a followup and as a work-in.   She maintains:  1. Advair 250/50 one spray b.i.d.  2. Oxygen 2 liters continuous.  3. Spiriva daily.  4. Xopenex via nebulization q.i.d.   PHYSICAL EXAMINATION:  Temperature 99.0, blood pressure 108/74, pulse  112, saturation is 96% on 2 liters.  CHEST: Showed diminished breath sounds with prolonged expiratory phase.  No wheeze or rhonchi were noted.  CARDIAC: Showed a regular rate and rhythm without S3. Normal S1, S2.  ABDOMEN: Soft, nontender.  EXTREMITIES: Showed no edema or clubbing.  SKIN: Was clear.   IMPRESSION:  Acute bronchitis with flare.   PLAN:  Is for the patient to finish a course of five days of Avelox at  400 mg a day. No systemic steroids are indicated. Will see the patient  back in followup in four weeks time.     Charlcie Cradle [name], MD, [facility]  Electronically Signed    PEW/MedQ  DD: 04/26/2007  DT: 04/26/2007  Job #: [id number]   cc:   Candyce Churn. Allyne Gee, M.D.

## 2010-10-08 NOTE — Assessment & Plan Note (Signed)
[facility]                             PULMONARY OFFICE NOTE   MIRTA, MALLY                  MRN:          [id number]  DATE:04/12/2007                            DOB:          05/29/1950    [name] is a 60 year old African American female, history of  asthmatic bronchitis, chronic obstructive airways disease, reflux  disease.  This patient is noting some tightness in the chest but  otherwise doing reasonably well without active complaints.   The patient maintains:  1. Advair 250/50, one spray twice daily.  2. Spiriva daily.  3. Prednisone 5 mg daily.  4. Oxygen 2 liters continuous.  5. Accolate 20 mg b.i.d.   PHYSICAL EXAMINATION:  VITAL SIGNS:  Temp 98, blood pressure 118/86,  pulse 113, saturation 97% on 2 liters.  CHEST:  Showed diminished breath sounds, prolonged expiratory phase, no  wheeze or rhonchi noted.  CARDIAC:  Showed a regular rate and rhythm without S3.  Normal S1 S2.  ABDOMEN:  Soft, nontender.  EXTREMITIES:  Showed no edema or clubbing.  SKIN:  Clear.   IMPRESSION:  For this patient is chronic obstructive lung disease with  asthmatic bronchitic/emphysematous components.   PLAN:  For this patient is to maintain inhaled medicines as currently  dosed without change in plan-of-care.  And, we will see the patient back  in return followup in 6 weeks.     Charlcie Cradle [name], MD, [facility]  Electronically Signed    PEW/MedQ  DD: 04/12/2007  DT: 04/12/2007  Job #: [id number]   cc:   Candyce Churn. Allyne Gee, M.D.

## 2010-10-08 NOTE — Op Note (Signed)
[name], [name]           ACCOUNT NO.:  [id number]   MEDICAL RECORD NO.:  [id number]          PATIENT TYPE:  INP   LOCATION:  1228                         FACILITY:  [facility]   PHYSICIAN:  Kinnie Scales. Annalee Genta, M.D.DATE OF BIRTH:  01-13-1951   DATE OF PROCEDURE:  DATE OF DISCHARGE:                               OPERATIVE REPORT   LOCATION:  [facility].   POSTOPERATIVE DIAGNOSES:  Indication for surgery:  1. Ventilatory dependent respiratory failure.  2. Cavitary pneumonia.   POSTOPERATIVE DIAGNOSES:  Indication for surgery:  1. Ventilatory dependent respiratory failure.  2. Cavitary pneumonia.   SURGICAL PROCEDURE:  Tracheostomy.   ANESTHESIA:  General endotracheal.   COMPLICATIONS:  None.   BLOOD LOSS:  None.  The patient transferred to the operating room to the  ICU in stable condition.  A #6 Shiley tracheostomy tube was inserted  without difficulty.   BRIEF HISTORY:  The patient is a 60 year old black female who was  admitted to [facility] on June 16, 2008.  She had  progressive airway issues and pneumonia.  She was intubated for airway  management.  Unfortunately, the patient's medical problems worsened, and  she developed cavitary pneumonia and anticipated long-term ventilatory  support.  The ENT service was consulted for placement of tracheostomy  for long-term care.  Risks, benefits and possible complications of the  procedure were discussed in detail with the patient's family and they  understood and concurred with out plan for surgery which is scheduled on  elective basis on July 05, 2008.   PROCEDURE:  The patient brought to the operating room and placed in the  supine position on the operating table.  General endotracheal anesthesia  was established via the patient's existing endotracheal tube.  The  patient was adequately anesthetized.  She was positioned on the  operating table, prepped and draped in sterile fashion.  She was  injected with 4 mL of 1% lidocaine 1:100,000 solution epinephrine  injected in a subcutaneous fashion in the skin in the proposed incision  site.  After allowing adequate time for vasoconstriction hemostasis a 3  cm horizontally oriented skin incision was created with a #15 scalpel  blade.  Subcutaneous fat was debrided.  Strap muscles were identified in  the midline and dissected laterally allowing direct access to the  anterior compartment the neck.  The patient's airway was gently  dissected and the thyroid isthmus was identified, divided and suture  ligated with 2-0 chromic suture.  This allowed access to the entire  anterior tracheal wall.  At the second tracheal interspace a horizontal  tracheotomy incision was created with the Metzenbaum scissors.  The  endotracheal tube was gently withdrawn and mucus secretions were  suctioned.  A #6 Shiley tracheostomy tube was then inserted out  difficulty.  The patient's airway was stable with good gas exchange and ventilation  the.  Tracheostomy tube was fixed in position with 3-0 Vicryl suture and  a Velcro trache tie.  The patient was then transferred from the  operating room to the intensive care in stable condition.  No  complications.  Blood loss  minimal.           ______________________________  Kinnie Scales. Annalee Genta, M.D.     DLS/MEDQ  D:  16/02/9603  T:  07/05/2008  Job:  [id number]

## 2010-10-08 NOTE — Assessment & Plan Note (Signed)
[name]                             PULMONARY OFFICE NOTE   [name], [name]                  MRN:          [id number]  DATE:02/24/2007                            DOB:          December 26, 1950    HISTORY OF PRESENT ILLNESS:  The patient is a 60 year old African-  American female patient of Dr. Delford [name]; with a known history of chronic  hypoxic respiratory failure, with underlying severe COPD with an  asthmatic bronchitic component; complicated by vocal cord dysfunction  syndrome and gastroesophageal reflux.  The patient presented from the  Acute offices with complaint of a 1-week history of productive cough  with thick, yellow green sputum; with some blood-tinged sputum this  morning. wheezing, hoarseness and shortness of breath.  The patient  denies any frank hemoptysis, abdominal pain, nausea or vomiting, leg  swelling.   PAST MEDICAL HISTORY:  Reviewed.   PHYSICAL EXAMINATION:  The patient is a pleasant chronically ill-  appearing female in no acute distress.  Temperature 100.2, blood  pressure 126/82, O2 saturations 99% on 2 liters.  HEENT:  Unremarkable.  NECK:  Supple without adenopathy.  No JVD.  LUNGS:  Sounds reveal diminished breath sounds at the bases, otherwise  clear.  CARDIAC:  Regular rate.  ABDOMEN:  Soft and nontender.  EXTREMITIES:  Warm without any edema.   IMPRESSION AND PLAN:  ACUTE CHRONIC OBSTRUCTIVE PULMONARY DISEASE  EXACERBATION.  The patient is to begin Avelox x7 days.  Chest x-ray is  pending at the time of dictation.  Continue on her present pulmonary  toilet with Mucinex and nebulizers.  The patient is return back to Dr.  Delford [name] as scheduled, or sooner if need be.      Rubye Oaks, NP  Electronically Signed      [name] Cradle [name] Field, MD, [facility]  Electronically Signed   TP/MedQ  DD: 02/24/2007  DT: 02/24/2007  Job #: [id number]

## 2010-10-08 NOTE — Discharge Summary (Signed)
[name], [name]           ACCOUNT NO.:  [id number]   MEDICAL RECORD NO.:  [id number]          PATIENT TYPE:  INP   LOCATION:  1228                         FACILITY:  [facility]   PHYSICIAN:  Charlcie Cradle. [name], MD, FCCPDATE OF BIRTH:  1950-07-25   DATE OF ADMISSION:  06/16/2008  DATE OF DISCHARGE:                               DISCHARGE SUMMARY   FINAL DIAGNOSES:  1. 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  2. Agitation/delirium, complicated by depression.  3. Hyperglycemia.  4. Hypertension.  5. Acute renal failure, resolved.  6. Ileus, resolved.  7. Anemia, resolved.  8. Thrombocytopenia.   PROCEDURES:  1. Endotracheal tube placed January 22 and removed June 04, 2008.  2. Tracheostomy placed July 05, 2008 by Dr. Osborn Coho.  3. Right PICC line placed January 26,  removed June 27, 2008.  4. Right radial A-line placed January 30, removed July 09, 2008.  5. Left internal jugular vein catheter placed June 27, 2008,      removed July 11, 2008.  6. PICC line right upper extremity placed July 11, 2008.   CULTURE DATA:  1. Blood cultures June 16, 2008, negative.  Blood cultures on      June 23, 2008 were 2/2 positive for methicillin-resistant staph      aureus.  2. Urinary culture on June 23, 2008, demonstrates vancomycin      sensitive Enterococcus and Klebsiella ESBL, sensitive to Primaxin.  3. Sputum on June 23, 2008 shows MRSA.  4. Bronchioalveolar lavage on July 03, 2008, demonstrated both      methicillin-resistant staph and Klebsiella.  5. On July 13, 2008, sputum culture demonstrated moderate      Pseudomonas.   CONSULTANTS:  Dr. Osborn Coho  with [facility].   LABORATORY DATA:  July 17, 2008:  Sodium 143, potassium 4.4,  chloride 109, bicarbonate 28, glucose 100, BUN 15, creatinine 0.98,  calcium 8.1, magnesium 1.9,  phosphorus 2.5, hemoglobin 7.9, hematocrit  24.2, white blood cell count 9.1, platelet count 373.   EVENTS/STUDIES:  CT of chest done July 02, 2008 demonstrated  extensive necrotizing pneumonia.  July 04, 2008, a  2-D  echocardiogram evaluation for vegetation was negative.  However poor  windows to completely excluded.   BRIEF HISTORY:  This is a 60 year old patient followed by Dr. [name] in  the outpatient setting with a known history of severe chronic  obstructive pulmonary disease requiring chronic oxygen and steroids.  She presented to the emergency room on June 16, 2008, in acute  respiratory distress secondary to exacerbation of chronic obstructive  pulmonary disease.  She underwent a prolonged and difficult intubation  and pulmonary critical care team was asked to admit.   PAST MEDICAL HISTORY:  Possible Crohn disease, status post partial  colectomy; vocal cord dysfunction, osteoporosis, hyperlipidemia,  gastroesophageal reflux disease, chronic obstructive pulmonary disease.  SOCIAL HISTORY:  Prior smoker, stopped 2-3 years prior to admit.   FAMILY HISTORY:  History positive for mother with asthma.   ALLERGIES:  No known drug allergies.   HOSPITAL COURSE DISCHARGE DIAGNOSIS:  1. Ventilator dependent/tracheostomy dependent respiratory failure in      the setting of exacerbation of chronic obstructive pulmonary      disease, further complicated by cavitary pneumonia with both      methicillin-resistant staph aureus and Pseudomonas cultured from      bronchioalveolar lavage.  Ms. [name] has been maintained on      mechanical ventilation since day of admission, June 15, 2008.      Ventilator mechanics have been challenging secondary to underlying      chronic obstructive pulmonary  disease which is severe in nature      with known chronic respiratory failure on this basis.  [name]      continued on mechanical ventilation with antibiotic coverage as      described above.  She completed a total course of therapy for      methicillin-resistant staph aureus but she continued to have what      appeared to be cavitary pneumonia on CAT scan.  Follow-up sputum      culturing demonstrated Pseudomonas for which she is currently being      treated with Primaxin.  This was initiated on July 13, 2008.      Eventually she was deemed difficult to wean on the basis of      pulmonary mechanics, poor tolerance of pressure support trials, and      decreased mental status.  Therefore, the decision was made to      undergo tracheostomy in anticipation of prolonged weaning trial.      She underwent tracheostomy placement on July 05, 2008.  Since      that time, she remains on full ventilator support, she is currently      on the wean protocol cycling on pressure support of 10 cm water.      From a pulmonary standpoint, Ms. [name] has severe underlying      chronic obstructive pulmonary disease, now with structural      parenchymal damage in the setting of cavitary pneumonia.  It is      anticipated that if she can indeed be liberated from mechanical      ventilation, this will take some time in a long-term acute care      setting.  In as far as Pseudomonas management, again she was placed      on Primaxin and the plan for this is to complete a 14-day course.      This was initiated on July 13, 2008.  Additionally she will      continue gentle diuresis and scheduled bronchodilators as well as      maintenance prednisone dosing.  2. Agitation, depression.  There was a concern as to whether or not      Ms. [name] had a hypoxic ischemic event in the setting of prolonged      intubation efforts, however, she is more awake at this time and      appears to have more of an  element of delirium and depression.  She      remains significantly debilitated.  She is on Celexa q.h.s. for      management of depression with p.r.n. Ativan.  3. History of methicillin-resistant staph aureus bacteremia.  This has  resolved, status post treatment with vancomycin.  4. Resolved septic shock secondary to above.  This was treated in the      usual fashion with aggressive volume resuscitation, brief support      on vasoactive drips, and management of a complicated ventilator      mechanics as auto PEEP and air stacking was a significant component      of hemodynamic instability.  5. Hyperglycemia, probably exacerbated by steroids.  Plan for this is      to continue sliding scale insulin.  6. Hypertension.  This is improved on metoprolol and managed.  7. Acute renal failure secondary to shock above.  This is currently      resolved and she is tolerating diuresis.  8. Ileus.  This has resolving and the patient continues to tolerate      tube feeds.  9. Anemia with stable hemoglobin at 7.9.  She did have      thrombocytopenia.  Since that time, her heparin has been      discontinued and this is not longer been an issue.  Deep vein      thrombosis prophylaxis has been maintained with pneumatic air      stockings.   DISCHARGE MEDICATIONS:  Primaxin 500 mg IV q.8 hours, Combivent 6 puffs  q.6 hours, Celexa 20 mg via tube q.h.s., Ativan 1 mg via tube every 12  hours, Protonix 40 mg via tube q.12 hours, potassium chloride 40 mEq  b.i.d.,  Lasix 20 mg IV q.12 hours, sodium chloride at keep vein open  rate, Lantus insulin 5 units subcu q.12 hours, sliding scale NovoLog  insulin every 4 hours, prednisone 10 mg daily via tube a.c., Ventolin  HFA 6 puffs q.4 hours p.r.n. shortness of breath, Ativan 1 mg IV q.6 h  p.r.n. anxiety, fentanyl 25-100 mcg q.90 minutes p.r.n. pain, Tylenol  650 mg via tube q.6 hours discomfort.   ACTIVITY:  Cleared for ventilator wean at the discretion  of attending  physician and pulmonary consultation at [facility]   DIET:  Jevity 1.2 cal, 50 mL an hour.   VENTILATOR SETTINGS:  Currently rest mode is PRVC, tidal volume 400,  rate of 10, PEEP of 5, FIO2 30%.  Again cleared for pressure support  weaning trial at the discretion of [facility] staff.   DISPOSITION:  Ms. Koerner has met maximum benefit from inpatient stay in  acute care setting.  She is now cleared for discharge to long-term acute  care setting hospital.  She has currently been admitted to [facility] and anticipate transfer later today.      Zenia Resides, NP      Charlcie Cradle. [name], MD, [facility]  Electronically Signed    PB/MEDQ  D:  07/17/2008  T:  07/17/2008  Job:  [id number]

## 2010-10-08 NOTE — Assessment & Plan Note (Signed)
[facility]                             PULMONARY OFFICE NOTE   [name], [name]                  MRN:          [id number]  DATE:12/02/2006                            DOB:          Feb 13, 1951    [name] is seen today in followup.  This is a 60 year old African-  American female, history of chronic obstructive lung disease, asthmatic  bronchitis, emphysematous component, exsmoker, vocal cord dysfunction,  reflux disease, osteoporosis.  She has had a slight increase of cough of  pale yellow mucus, increased dyspnea, soreness in the throat, postnasal  drainage is increased as well.  Shortness of breath has slightly  worsened.   She is on the Advair 250/50 one spray b.i.d.  Accolate 20 mg b.i.d.  Flutter valve is as needed.  Oxygen 2 L continuous, Spiriva daily,  Xopenex by nebulization b.i.d.   EXAM:  Temp 98, blood pressure 110/76, pulse 108, saturation 99% on 1.5  L.  CHEST:  Distant breath sounds with prolonged expiratory phase.  No  wheeze or rhonchi were noted.  CARDIAC:  Showed a regular rate and rhythm without S3. Normal S1, S2.  ABDOMEN:  Soft, nontender.  EXTREMITIES:  No edema or clubbing.  SKIN:  Clear.   IMPRESSION:  Asthmatic bronchitis with mild flare, mild allergic  rhinitis.   PLAN:  For this patient is to begin doxycycline 100 mg twice a day for a  7 day course.  Omeprazole will be adjusted down to 20 mg daily.  Prednisone will be lowered to 2.5 mg daily.  Oxygen will be maintained.  Xopenex is maintained, Advair maintained as is, as is Accolate. The  patient has been approved to pursue water aerobics and we will see the  patient back in followup in 2 months.     Charlcie Cradle [name], MD, [facility]  Electronically Signed    PEW/MedQ  DD: 12/03/2006  DT: 12/03/2006  Job #: [id number]   cc:   Candyce Churn. Allyne Gee, M.D.

## 2010-10-11 NOTE — Assessment & Plan Note (Signed)
[facility]                             PULMONARY OFFICE NOTE   [name], [name]              MRN:          [id number]  DATE:05/21/2006                            DOB:          12-Dec-1950    This is a 60 year old African-American female who comes to the office  today after having a prolonged hospitalization this fall for acute  colitis from Crohn's disease.  She ultimately required total colectomy  at [facility] followed by a protracted rehab time at [facility].  During these periods of time the patient had pneumonia,  anemia requiring transfusion, need for hyperalimentation centrally,  esophageal strictures with dilations x3, chronic recurrent dysphagia,  and tachycardia.  Currently she is out of the hospital back at home.  She is in an improved state.   Currently, medications include:  1. Boniva every 3 months on an injectable basis.  2. Advair 250/50 one spray b.i.d.  3. Accolate 20 mg b.i.d.  4. Flutter valve q.i.d.  5. Furosemide 20 mg daily.  6. Lexapro 10 mg daily.  7. Lovastatin 20 mg daily.  8. Mucinex two b.i.d.  9. Oxygen 2 L continuous.  10.Prednisone 5 mg daily.  11.Spiriva daily.  12.Vitamins daily.  13.Calcium - Citrucel daily.  14.Magnesium daily.  15.Fergon daily.  16.Xopenex 1.25 mg q.i.d.  17.Omeprazole 20 mg b.i.d.  18.Deplin 7.5 mg daily.  19.Ativan 1 mg t.i.d.   EXAMINATION:  GENERAL:  This is a well-developed, well-nourished African-  American female in no distress.  VITAL SIGNS:  Temperature 98, blood pressure 120/74, pulse 127,  saturation 98% on 0.5 L.  Weight 129 pounds.  CHEST:  Showed distant breath sounds with prolonged expiratory phase.  There was no evidence of wheeze, rale or rhonchi.  CARDIAC:  Showed a regular rate and rhythm without S3, normal S1 and S2.  ABDOMEN:  Soft, nontender.  There was no organomegaly.  EXTREMITIES:  Showed no clubbing, edema, or venous disease.  SKIN:   Clear.  NEUROLOGIC:  Intact.  NECK:  Showed no jugular venous distention, lymphadenopathy.  Neck  supple.  HEENT:  Oropharynx clear, nares clear.   IMPRESSION:  That of chronic obstructive lung disease, asthmatic  bronchitis emphysematous component, stable at this time.   RECOMMENDATIONS:  Would halve the dose of Xopenex to 0.63 mg b.i.d. to  t.i.d.  No other changes in medications are made.  Refills on Advair,  Accolate, and Spiriva were given.  Will see the patient back in return  followup in 2 months.     Charlcie Cradle [name], MD, [facility]  Electronically Signed    PEW/MedQ  DD: 05/21/2006  DT: 05/21/2006  Job #: [id number]   cc:   Danise Edge, M.D.  Candyce Churn. Allyne Gee, M.D.

## 2010-10-11 NOTE — Discharge Summary (Signed)
[facility]  Patient:    [name]                    MRN: [id number] Adm. Date:  [id number] Disc. Date: [id number] Attending:  Caleb Popp Dictator:   Earley Favor, RN, MSN, ACNP CC:         Lindell Spar. Chestine Spore, M.D.   Discharge Summary  DATE OF BIRTH:  [birth year]  DISCHARGE DIAGNOSES: 1. Acute exacerbation of chronic obstructive pulmonary disease and asthmatic    bronchitis. 2. Hypokalemia. 3. Crohns disease. 4. Chronic dyspnea on exertion and shortness of breath.  PROCEDURES:  None.  HISTORY OF PRESENT ILLNESS:  [name] is a 60 year old black female with known chronic obstructive pulmonary disease and asthmatic bronchitis who presented with a five day history of worsening dyspnea, wheezing, chest tightness, and nonproductive cough.  In the emergency department, she was noted to be desaturated with minimal exertion; therefore, she was admitted to [facility] for further evaluation and treatment.  LABORATORY DATA:  Sodium 140, potassium 3.4 note a low of 3.0, chloride 108, CO2 26, BUN 30, creatinine 0.9, glucose 117, magnesium 1.9.  Arterial blood gases were 1.5 liter nasal cannula, PA 7.34, PCO2 49, PO2 73.  WBCs 22, hemoglobin 11.9, hematocrit 35.7, platelets 223, ALT 16, ALP 71, total bilirubin 0.2.  Chest x-ray revealed no acute cardiac or pulmonary findings. Stable appearance of chest since April 2001.  HOSPITAL COURSE: #1 - ACUTE EXACERBATION OF ASTHMATIC BRONCHITIS AND CHRONIC OBSTRUCTIVE PULMONARY DISEASE:  She was treated with IV steroids, mucolytics, and bronchodilators via nebulizers.  Her pulmonary status improved with treatment, and she reached maximal hospital benefit by March 25, 2000.  Of note, she continues to have morning shortness of breath and dyspnea on exertion.  This will be followed up on an outpatient basis.  #2 - HYPOKALEMIA:  Potassium level reached a low of 3.0.  She was on  daily Lasix per her prehospitalization pharmaceutical regimen.  K-Dur was added to her pharmaceutical treatments.  Her potassium level returned to 3.9 on day of discharge.  #3 - CROHNS DISEASE:  Crohns disease remained stable.  #4 - SHORTNESS OF BREATH AND DYSPNEA ON EXERTION:  Has been lasting since April 2001.  This symptom will be passed on to Dr. Chestine Spore for questionable cardiac evaluation.  #5 - JOINT PAIN:  She continues to have joint pain.  She is requesting a rheumatology consult with Dr. Kellie Simmering.  Again, this will be addressed with Dr. Chestine Spore, as he is her primary care physician.  MEDICATIONS:  1. Advair 250/50 1 puff two times a day.  Of note, Serevent and Pulmicort are     on hold now.  2. Accolate 20 mg 1 q.d.  3. ______  600 mg 2 tabs b.i.d., a guaifenesin product.  4. Lorazepam 0.5 mg a.m. 2 mg q.h.s.  5. Protonix 40 mg q.d.  6. O2 as needed.  7. Proventil as a rescue, MDI 2 puffs as needed.  8. Ambien 10 mg q.h.s.  9. Premarin 1.25 mg q.d. 10. Nu-Iron 1 tablet q.d. 11. Lasix 20 mg 1 q.d. Monday through Friday. 12. Prednisone on taper 40 mg for 4 days, 30 mg for 4 days, 20 mg for 4 days,     10 mg for 1 day and stay. 13. Vitamins as before. 14. Magnesium oxide 250 mg 2 tablets b.i.d. 15. K-Dur 20 mEq 1 q.d.  FOLLOW-UP:  She has a follow-up scheduled with  Earley Favor, NP, on April 02, 2000, at 2:15 p.m.  Dr. [name] on April 06, 2000, and with Dr. Chestine Spore on April 06, 2000.  DIET:  Low salt.  SPECIAL INSTRUCTIONS:  She is to not take her Serevent and Pulmicort.  DISPOSITION/CONDITION ON DISCHARGE:  Her acute bronchospastic state has been resolved.  She continues to have shortness of breath on a daily basis on the a.m.  She will be followed up as an outpatient for this.  Her condition is improved. DD:  03/25/00 TD:  03/25/00 Job: [id number]

## 2010-10-11 NOTE — Consult Note (Signed)
[name], [name]           ACCOUNT NO.:  [id number]   MEDICAL RECORD NO.:  [id number]          PATIENT TYPE:  INP   LOCATION:  6732                         FACILITY:  [facility]   PHYSICIAN:  Antonietta Breach, M.D.  DATE OF BIRTH:  10-08-1950   DATE OF CONSULTATION:  12/11/2005  DATE OF DISCHARGE:                                   CONSULTATION   REQUESTING PHYSICIAN:  John C. Madilyn Fireman, M.D.   REASON FOR CONSULTATION:  Anxiety and depression.   HISTORY OF PRESENT ILLNESS:  [name] is a 60 year old separated female  admitted with a flair of Crohn's disease on November 27, 2005.  She underwent a  colonoscopy.  The patient has been experiencing a lot of ongoing abdominal  pian.  She has a number of other stresses including marital separation,  divorce pending.  She lives with her elderly mother and her sister.  Her  aunt is dying.   The patient cries easily.  She has been having depressed mood and decreased  energy and difficulty concentrating.  She is having insomnia.  She has no  thoughts of harming herself, no thoughts of harming others.  No  hallucinations or delusions.   PAST PSYCHIATRIC HISTORY:  The patient has a long-term history of excess  worry and feeling on edge with anxiety documented in the medical record in  2005.  At that time she was treated with Clonazepam 0.25 mg q.a.m. and 1 mg  nightly.  Lexapro was added and she was on 10 mg daily in April 2007.  The  Klonopin was switched over to Xanax.   FAMILY PSYCHIATRIC HISTORY:  None.   SOCIAL HISTORY:  Marital:  Divorced.  Religion:  [employer].  The patient lives  with her mother who is 14 years old and a sister who is ill.  She does not  use alcohol or illegal drugs.   GENERAL MEDICAL PROBLEMS:  Crohn's disease with a current exacerbation.  She  is also on 2 liters of oxygen at home.   MEDICATIONS:  The MAR is reviewed.  The patient is on Xanax 0.25 mg nightly  p.r.n. and Lexapro 10 mg p.o. daily.  The Lexapro has been  prescribed for  approximately one year by history and it is documented in the medical record  as of April 2007.   LABORATORY DATA:  Hemoglobin is decreased at 6.9.   REVIEW OF SYSTEMS:  CONSTITUTIONAL:  Afebrile.  HEAD:  No trauma.  EYES:  No  visual changes.  EARS:  No hearing impairment.  NOSE:  No rhinorrhea.  MOUTH/THROAT:  No sore throat.  NEUROLOGIC:  Unremarkable.  PSYCHIATRIC:  As  above.  CARDIOVASCULAR:  No chest pain, palpitations, or edema.  RESPIRATORY:  No coughing.  GASTROINTESTINAL:  As above.  GENITOURINARY:  No  dysuria.  SKIN:  Unremarkable.  MUSCULOSKELETAL:  No deformities,  weaknesses, or atrophy.  HEMATOLOGIC/LYMPHATIC:  Anemic, as above with  current Crohn's flare.  ENDOCRINE/METABOLIC:  Unremarkable.   ALLERGIES:  UNCOATED ALLERGY INVOLVING SURGICAL GEL, WHICH BURNS THE SKIN.   EXAMINATION:  VITAL SIGNS:  Temperature 97.3, pulse 89, respiration 18,  blood  pressure 120/72.  The CBGs have been running 153 and 312.  MENTAL STATUS EXAM:  [name] is alert and cooperative, lying in a supine  position in her hospital bed.  Her fund of knowledge and intelligence are  greater than average.  Her thought process is logical, coherent, and goal-  directed without looseness of associations.  Thought content:  No thoughts  of harming herself.  No thoughts of harming others.  No delusions, no  hallucinations.  Concentration is mildly decreased.  Speech involves normal  rate and prosody.  The patient reports being overwhelmed with her medical  illness and the psychosocial stresses of divorce, relocation, having to live  with her mother.  The illness in her sister as well.  The patient describes  social isolation.  She reports that her activity in her church has been a  very supportive experience.  Her mood is depressed.  Affect is anxious when  intermittent, tearing.  Her insight is good.  Her judgment is intact.   ASSESSMENT:  Mood disorder not otherwise specified,  depressed, 293.83  (functional and organic elements including a hemoglobin at 6.9).   AXIS II:  None.   AXIS III:  See general medical problems.   AXIS IV:  General medical primary support group, grief.   AXIS V:  55.   The patient is not at risk to harm self or others.  She agrees to use  emergency services as needed.   RECOMMENDATIONS:  1.  The patient concurred with starting Remeron 15 mg nightly for      antidepression along with acute side effect benefits of anti-anxiety,      improved sleep, anti-nausea, and appetite stimulation.  2.  Outpatient psychiatric followup options include [facility], [facility], [facility].  Please ask the case manager to facilitate      followup process.      Antonietta Breach, M.D.  Electronically Signed     JW/MEDQ  D:  12/12/2005  T:  12/12/2005  Job:  [id number]

## 2010-10-11 NOTE — Consult Note (Signed)
NAMEMAUDIE, SHINGLEDECKER NO.:  [id number]   MEDICAL RECORD NO.:  [id number]                   PATIENT TYPE:  INP   LOCATION:  0365                                 FACILITY:  [facility]   PHYSICIAN:  Danise Edge, M.D.                DATE OF BIRTH:  10-03-1950   DATE OF CONSULTATION:  12/12/2003  DATE OF DISCHARGE:                                   CONSULTATION   HISTORY:  [name] is a 60 year old female born January 07, 1951.  [name]' primary care physician is Dr. Dorothyann Peng.   [name] was diagnosed with Crohn's ileocolitis in 1983.  She has required  no surgery for her Crohn's disease.  On July 21, 2001, a  proctocolonoscopy to the cecum revealed only generalized proctitis and cecal  colitis.   [name] cannot take HIGH-DOSE PREDNISONE.  She develops a severe myopathy  that has required mechanical ventilator on high-dose prednisone.  She is on  chronic low-dose prednisone for her severe chronic obstructive pulmonary  disease.   [name] presented to the [facility] emergency room on December 11, 2003 with abdominal pain and bloody diarrhea.  CT scan of the abdomen and  pelvis revealed thickened right colon and transverse colon.  The urinalysis  was normal.  CBC was normal.  Complete metabolic profile was normal.   Due to her continued diarrhea and abdominal discomfort, she represented to  the [facility]  emergency room on December 12, 2003.  She reports no  vomiting.  Her acute abdominal x-ray series and abdominal/pelvic CT scan  yesterday showed no abscess or obstruction.   MEDICATION ALLERGIES:  SURGICAL GEL.   CHRONIC MEDICATIONS:  Advair.  Accolate.  Nexium.  Premarin.  Spiriva.  Prednisone.  Clonazepam.  Proventil.  Nasonex.   Avelox  was discontinued.   PAST MEDICAL HISTORY:  1. Steroid-induced myopathy requiring mechanical ventilator.  2. Crohn's ileocolitis diagnosed in 1983.  3. Severe chronic  obstructive pulmonary disease.  4. Total abdominal hysterectomy for cervical cancer in situ.  5. Gastroesophageal reflux disease.  6. Hypertension.  7. Elevated cholesterol.  8. Anxiety.   FAMILY HISTORY:  Negative for colon cancer.   PHYSICAL EXAMINATION:  GENERAL:  [name] is alert, attentive, and appears  comfortable lying on her stretcher in the emergency room.  HEENT:  Nonicteric sclerae.  Recent hoarseness.  Recent ENT exam by Dr.  Jearld Fenton.  LUNGS:  Clear.  No audible wheezing.  HEART:  Regular rhythm without murmurs.  ABDOMEN:  Soft and flat with normal bowel sounds.  Palpation reveals  soreness in all four quadrants.   ASSESSMENT:  1. Right-sided colitis by December 11, 2003 CT scan of the abdomen and pelvis,     which revealed no abscess or obstruction.  2. Chronic Crohn's ileocolitis.  3. Steroid-induced myopathy on high-dose steroids.  4. Chronic low-dose prednisone use.   RECOMMENDATIONS:  1. Stool culture.  2. Stool screen for C. difficile toxin.  3. Start Azulfidine 500 mg p.o. q.i.d.  4. Start metronidazole 250 mg p.o. t.i.d.  5. Low-residue diet.  6. If no improvement, start oral budesonide.                                               Danise Edge, M.D.    MJ/MEDQ  D:  12/12/2003  T:  12/12/2003  Job:  [id number]   cc:   [name], M.D. [facility]

## 2010-10-11 NOTE — H&P (Signed)
NAMETORIE, TOWLE           ACCOUNT NO.:  [id number]   MEDICAL RECORD NO.:  [id number]          PATIENT TYPE:  INP   LOCATION:  0102                         FACILITY:  [facility]   PHYSICIAN:  Danise Edge, M.D.   DATE OF BIRTH:  09-01-1950   DATE OF ADMISSION:  09/18/2005  DATE OF DISCHARGE:                                HISTORY & PHYSICAL   ADMISSION DIAGNOSES:  1.  Bloody diarrhea.  2.  Chronic Crohn's ileocolitis.   HISTORY:  [name] is a 60 year old female born January 07, 1951.  She was diagnosed with chronic Crohn's ileocolitis in 1983.  She has  not required intestinal surgery.   She underwent a proctocolonoscopy to the cecum in 2003 which revealed  Crohn's proctitis and Crohn's cecal colitis only.   In 2005, she underwent a repeat proctocolonoscopy to the cecum which  revealed universal Crohn's proctocolitis.   In 2006, she underwent an esophagogastroduodenoscopy with small bowel biopsy  to rule out celiac sprue; her esophagogastroduodenoscopy and small bowel  biopsies were normal.   On May 27, 2005, she underwent a flexible proctosigmoidoscopy in my  office to evaluate painless hematochezia; the exam revealed distal proctitis  only.   [name] has severe chronic obstructive pulmonary disease and is  chronically taking a small dose of prednisone.  She did require mechanical  ventilation in the past due to steroid-induced myopathy.   [name] developed diarrhea with blood, mild abdominal cramps, low-grade  fever approximately 72 hours ago.  She reports no vomiting.   In the emergency room, her white blood cell count was 11,500, hemoglobin  12.1 g.  Her comprehensive metabolic profile was normal except for a  slightly low potassium, 3.3.  Her urinalysis was unremarkable.  Preliminary  report of her abdominal/pelvic CT scan reveals edema of the left colon.   ALLERGIES:  CERVICAL GEL.   CHRONIC MEDICATIONS:  1.  Advair 250/50 one  inhalation b.i.d.  2.  Accolate 20 mg b.i.d.  3.  Alprazolam at night p.r.n.  4.  Aspirin 81 mg each morning.  5.  Canasa suppositories 1000 mg daily p.r.n.  6.  Deplin 7.5 mg daily.  7.  Generic Allegra 180 mg nightly.  8.  Fosamax oral solution 70 mg weekly.  9.  Furosemide 20 mg each morning.  10. Lexapro 10 mg each evening.  11. Lovastatin 20 mg each evening.  12. Mucinex 1200 mg b.i.d.  13. Nasal oxygen at 2 L/min.  14. Prednisone 5 mg each morning.  15. Protonix 40 mg each morning.  16. Premarin 0.3 mg each morning.  17. Spiriva hand inhaler one inhalation each evening.  18. Multivitamin daily.  19. Xopenex inhalation solution 1.2 mg per 3 mL by nebulizer q.i.d.   FAMILY HISTORY:  Negative for colon cancer.   HABITS:  [name] does not consume alcohol or use tobacco products.   PHYSICAL EXAMINATION:  VITAL SIGNS:  Temperature 99.4, oxygen saturation  99%, blood pressure 102/64.  GENERAL APPEARANCE:  [name] appears alert and is lying comfortably on  the stretcher.  EYES:  Anicteric sclerae.  LUNGS:  Clear  to auscultation.  No wheezing noted.  CARDIAC:  Exam reveals a regular rhythm without murmurs.  ABDOMEN:  Soft and flat.  There is minimal discomfort to deep palpation in  the lower abdomen.  SKIN:  Warm and dry.   ASSESSMENT:  Exacerbation of chronic Crohn's ileocolitis versus infectious  gastroenteritis.   PLAN:  Check stool for C. difficile toxin.  Check stool culture for enteric  pathogen.  Start Pentasa 3000 mg daily.  Increase prednisone to 40 mg daily.           ______________________________  Danise Edge, M.D.     MJ/MEDQ  D:  09/18/2005  T:  09/18/2005  Job:  [id number]   cc:   Shan Levans, M.D. [facility]  [address]  [place]  [place] [address]   Candyce Churn. Allyne Gee, M.D.  Fax: [phone]

## 2010-10-11 NOTE — H&P (Signed)
[name], [name]           ACCOUNT NO.:  [id number]   MEDICAL RECORD NO.:  [id number]          PATIENT TYPE:  EMS   LOCATION:  MAJO                         FACILITY:  [facility]   PHYSICIAN:  John C. Madilyn Fireman, M.D.    DATE OF BIRTH:  11-03-1950   DATE OF ADMISSION:  11/27/2005  DATE OF DISCHARGE:                                HISTORY & PHYSICAL   CHIEF COMPLAINT:  Bloody diarrhea and abdominal pain.   HISTORY OF PRESENT ILLNESS:  The patient is a 60 year old black female with  history of Crohn's ileocolitis, who has had a flare up of typical symptoms  of diarrhea, rectal bleeding, and abdominal cramps with some dry heaves over  the last week. She has had difficulty getting maintenance Pentasa due to  lack of insurance and was off it for some time since her last discharge on  Oct 13, 2005. Dr. Henriette Combs office has tried to get it under passionate  relief. She contacted his office on 6 days ago and was able to get a short  course but has only been taking it at 1 pill three times a day to make is  last longer. She is also on prednisone 5 mg a day. She also has significant  asthma and has been leery of prednisone in the past due to perceived  worsening of respiratory symptoms on it and on one occasion in the past. She  denies any fever, any productive vomiting. She did have a white blood cell  count of 15,000 when she presented.   PAST MEDICAL HISTORY:  1.  Crohn's ileocolitis.  2.  Severe COPD and asthma.   ALLERGIES:  CERVICAL GEL.   MEDICATIONS:  Advair, Accolade, Alprazolam, aspirin, Deplane, Allegra,  Lexapro, Lovastatin, Mucinex, prednisone, Protonix, Premarin, Spiriva,  multivitamin, Xopenex, Pentasa, prednisone 5 mg a day.   FAMILY HISTORY:  Noncontributory.   SOCIAL HISTORY:  The patient denies alcohol or tobacco use.   PHYSICAL EXAMINATION:  GENERAL:  A well developed, well nourished, black  female in no acute distress.  VITAL SIGNS:  Pulse 110, blood pressure 110/70,  temperature 97.8.  HEART:  Regular rate and rhythm. Without murmur.  LUNGS:  Clear with somewhat distant breath sounds.  ABDOMEN:  Soft and slightly distended with normoactive bowel sounds. No  hepatosplenomegaly or mass. There is tenderness with guarding, primarily in  the left lower quadrant.   LABORATORY DATA:  Hemoglobin 10.7. White blood cell count 15,200.  Prothrombin time 14.4, INR of 1.1.   IMPRESSION:  Crohn's flare, partially related to inability to obtain  maintenance 5-ASA.   PLAN:  Will admit. Keep on all of her respiratory medications and start IV  Solu-Medrol while reinitiating Pentasa. Further recommendations to follow.           ______________________________  Everardo All Madilyn Fireman, M.D.     JCH/MEDQ  D:  11/27/2005  T:  11/27/2005  Job:  [id number]   cc:   Danise Edge, M.D.  Fax: [phone]   Shan Levans, M.D. [facility]  [address]  [place]  [place] [address]

## 2010-10-11 NOTE — Op Note (Signed)
[name], [name]           ACCOUNT NO.:  [id number]   MEDICAL RECORD NO.:  [id number]          PATIENT TYPE:  AMB   LOCATION:  ENDO                         FACILITY:  [facility]   PHYSICIAN:  Danise Edge, M.D.   DATE OF BIRTH:  1950-09-12   DATE OF PROCEDURE:  03/04/2005  DATE OF DISCHARGE:                                 OPERATIVE REPORT   PROCEDURE:  Esophagogastroduodenoscopy with small bowel biopsy.   PROCEDURE INDICATIONS:  [name] is a 60 year old female born  Mar 18, 1951. [name] was recently evaluated by Dr. Danne Baxter. The  following laboratory data were obtained:  Normal thyroid stimulating hormone  level; normal serum calcium associated with an intact parathyroid hormone  level 32.6 (normal 14-72); antigliaden antibody IgG normal; antigliaden  antibody IgA elevated; 25 hydroxy vitamin D level lower limits of normal.   Esophagogastroduodenoscopy with small-bowel biopsy is scheduled today to  look for signs of celiac sprue.   A December 15, 2003, proctocolonoscopy to the cecum revealed generalized  universal proctocolitis most consistent with Crohn's proctocolitis.   MEDICATION ALLERGIES:  Surgical gel.   CHRONIC MEDICATIONS:  Advair, Accolate, alprazolam, albuterol, aspirin,  clonazepam, Fosamax with vitamin D, furosemide, Lescol, Nasonex, nasal  oxygen at 2 L per minute, prednisone, Protonix, Premarin, Spiriva,  multivitamins.   PAST MEDICAL HISTORY:  1.  Crohn's ileocolitis diagnosed in 1983.  2.  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  3.  Severe obstructive pulmonary disease.  4.  Total abdominal hysterectomy for cervical cancer in situ.  5.  Gastroesophageal reflux disease.  6.  Hypertension.  7.  Hypercholesterolemia.  8.  Anxiety.   ENDOSCOPIST:  Danise Edge, M.D.   PREMEDICATION:  Versed 7.5 mg, Demerol 50 mg.   PROCEDURE:  After obtaining informed consent, [name] was placed in the  left  lateral decubitus position. I administered intravenous Demerol and  intravenous Versed to achieve conscious sedation for the procedure. The  patient's blood pressure, oxygen saturation and cardiac rhythm were  monitored throughout the procedure and documented in the medical record.   The Olympus gastroscope was passed through the posterior hypopharynx into  the proximal esophagus without difficulty. The hypopharynx, larynx and vocal  cords appeared normal.   Esophagoscopy: The proximal mid and lower segments of the esophageal mucosa  appear completely normal. There is no endoscopic evidence for the presence  of esophageal mucosal scarring, erosive esophagitis, Barrett's esophagus.   Gastroscopy: Retroflex view of the gastric cardia and fundus was completely  normal. The gastric body, antrum and pylorus appear completely normal.   Duodenoscopy: The duodenal bulb, second portion of duodenum, and third  portion of duodenum appear completely normal.   Small bowel biopsies: Five biopsies were taken from the second portion of  duodenum and third portion of duodenum to look for signs of celiac sprue.   ASSESSMENT:  Normal esophagogastroduodenoscopy. Small bowel biopsies  pending. Rule out signs of celiac sprue.           ______________________________  Danise Edge, M.D.     MJ/MEDQ  D:  03/04/2005  T:  03/04/2005  Job:  [id number]   cc:   Rande Brunt. Eda Paschal, M.D.  Fax: [phone]   Shan Levans, M.D. [facility]  [address]  [place]  [place] [address]   Candyce Churn. Allyne Gee, M.D.  Fax: [phone]

## 2010-10-11 NOTE — Discharge Summary (Signed)
[facility]  Patient:    [name]                    MRN: [id number] Adm. Date:  [id number] Disc. Date: [id number] Attending:  Virgia Land CC:         Lindell Spar. Chestine Spore, M.D.                           Discharge Summary  DISCHARGE DIAGNOSES: 1. Chronic obstructive lung disease with acute exacerbation of bronchitis. 2. Respiratory failure. 3. Acute and chronic anxiety syndrome with hypoventilation reaction. 4. Gastroesophageal reflux disease.  HISTORY OF PRESENT ILLNESS:  This is one of several [facility] hospitalizations for [name], a 60 year old African-American woman who presented to the emergency room with a several day history of progressive shortness of breath, chest tightness, wheezing and dizziness.  The patient was sent in the emergency room where she was noted to be fairly short of breath.  She was given respiratory treatment without any significant improvement in her AO2s and was admitted for further and more intensive treatment of her asthmatic bronchitis.  PHYSICAL EXAMINATION:  GENERAL:  She is a well-developed, well-nourished African-American woman with a  modest amount of respiratory distress.  VITAL SIGNS:  Temperature 100.2, blood pressure 120/84, pulse 110, respiratory ate 28-32.  HEENT:  She had no conjunctival injection, erythema, minimal nasal injection and no sinus tenderness.  NECK:  Supple.  No adenopathy, thyromegaly or jugular venous distension.  CHEST:  Respirations were increased in rate but unlabored.  There was no splinting, focal tenderness or deformities.  She had bilateral expiratory greater than inspiratory wheezes with scattered rhonchi throughout both lung fields.  There as no consolidation and no focal decrease in her breath sounds.  CARDIAC:  Rapid regular rhythm with no discernible murmurs, rubs, gallops, heaves or thrills.  ABDOMEN:  Nondistended, soft, nontender, no  organomegaly and no masses.  Bowel sounds are slightly increased.  LABORATORY DATA AND X-RAY FINDINGS:  Her admitting blood gas on 2 L of O2 per minute showed pO2 79, pCO2 42 with a pH of 7.35.  CBC: White count 5700 with a shift to the left, hematocrit 41, MCV normal.  Her sedimentation rate was 24. Comprehensive metabolic panel was completely normal.  Blood cultures x 2 revealed no growth.  Sputum likewise revealed no pathogens.  Her chest x-ray revealed permanent interstitial markings without any frank evidence of interstitial edema with no acute infiltrates.  A CT scan of the chest revealed a tiny 5 mm nodule ith ill-defined margins in the right upper lobe.  This was felt to represent a small area of focal scarring.  She did have a mild patchy infiltrate versus scarring f the left upper lobe as well.  There was no evidence for pulmonary embolization.   HOSPITAL COURSE:  The patient was admitted with a working diagnosis of chronic obstructive pulmonary disease with an acute exacerbation of asthmatic bronchitis. She was started on Solu-Medrol and given 125 mg IV every eight hours along with  Atrovent and albuterol nebulizer treatment.  After appropriate cultures were obtained, she was started empirically on Levaquin for presumed superimposed infectious component to her bronchitis.  Because of significant anxiety, she was given Xanax 0.25 to 5 mg b.i.d. on a p.r.n. basis with two days of fairly intensive respiratory therapy in combination with nebulizer therapy.  The patient continued to have significant respiratory difficulty.  A continuous  infusion of Theophylline at 35 mg/hour was initiated with very little improvement in her bronchospasm. ith her respiratory condition not improving significantly with this regimen, a pulmonary medicine consultation was obtained.  The patient was seen by the pulmonary consultant who recommended that the patient be transferred to  the medical ICU because of her continued respiratory insufficiency and failure to improve. The patient, who had been converted over to IV steroids to p.o. prednisone, was restarted on her IV steroids and to change from Levaquin to Rocephin as the antibiotic of choice.  Dr. [name], who saw the patient in consultation, felt that in addition to infection with intrinsic allergic asthma that the patient had a component of GERD precipitating and exacerbating her bronchospasm.  The patient was started on Protonix 40 mg b.i.d.  Her Theophylline was discontinued because it as felt that this may be exacerbating the gastroesophageal reflux and increasing her heart rate and her feeling of intense anxiety and panic.  With these changes in  place, the patients condition slowly improved over the ensuing three-day period to the point where the patient was able to be transferred back to her regular medical surgical floor.  She was continued on her nebulizer treatment and her steroids ere gradually tapered.  She was continued on sedatives and tranquilizers with her Protonix.  By hospital day #8, the patient was able to ambulate in her room without difficulty.  It was felt that she would need home O2 and with the assistance of the care management team, oxygen therapy was arranged for home use.  Her oral regimen was changed to Pulmicort to puffs b.i.d. along with Serevent two puffs b.i.d., Humibid LA two twice a day and she was continued on a tapering course of oral prednisone.  Her antibiotic was Ceftin 250 mg b.i.d. for an additional eight days.  CONDITION ON DISCHARGE:  Significantly improved.  DIET:  Regular diet.  SPECIAL INSTRUCTIONS:  It was recommended that she have available at home nasal O2 at 2 L per minute.  PROGNOSIS:  Fair.  FOLLOWUP:  She is to be seen in followup with Dr. [name] in two weeks.  She is o be seen in my office in three weeks. DD:  10/17/99 TD:  10/20/99 Job:  [id number]

## 2010-10-11 NOTE — Discharge Summary (Signed)
[name], [name]           ACCOUNT NO.:  [id number]   MEDICAL RECORD NO.:  [id number]            PATIENT TYPE:   LOCATION:                                 FACILITY:   PHYSICIAN:  Danise Edge, M.D.   DATE OF BIRTH:  February 15, 1951   DATE OF ADMISSION:  12/09/2005  DATE OF DISCHARGE:                                 DISCHARGE SUMMARY   DATE OF BIRTH:  Aug 14, 1950   DISCHARGE DIAGNOSES:  1. Severe Crohn proctocolitis which will require surgery.  2. Severe chronic obstructive pulmonary disease.  3. Chronic prednisone use for chronic obstructive pulmonary disease.  4. Total abdominal hysterectomy.  5. Chronic gastroesophageal reflux disease with normal      esophagogastroduodenoscopy.  6. Uncomplicated hypertension.  7. Elevated cholesterol.  8. Chronic anxiety.   DISPOSITION:  [name] will be transferred to [facility] on Dr. Loraine Leriche Koruda's surgical service.   HOSPITAL COURSE:  [name] is a 60 year old female born  [birth year].  She was diagnosed with Crohn colitis by barium enema in 1983.  In 1988, she underwent a small bowel follow through x-ray series, which  demonstrated distal ileitis.  [name] has done quite well with her  chronic Crohn colitis up until May 2007.   On 07/21/2001, I performed a proctocolonoscopy to the cecum, which revealed  only proctitis and cecal colitis with an otherwise normal exam.   In 2006 she underwent an esophagogastroduodenoscopy and small bowel biopsy  to evaluate a positive antigliadin antibody test.  Her  esophagogastroduodenoscopy and small bowel biopsies were normal.   On 09/24/2005, [name] was hospitalized with severe Crohn proctocolitis.  A proctocolonoscopy to the cecum demonstrated extremely severe Crohn  proctocolitis.  Biopsies demonstrated chronic active mucosal colitis with  ulceration and submucosal inflammation.  Her disease was stabilized and  slightly improved to the  point of discharge on Pentasa and Solu-Medrol.   As her prednisone was tapered, she developed an exacerbation of her  extremely severe Crohn proctocolitis, requiring hospitalization to [facility] 12/09/2005.  She has remained in the hospital in an attempt to  control her severe Crohn proctocolitis without significant success.   On 12/09/2005, her esophagogastroduodenoscopy was normal.  Repeat  proctocolonoscopy to the cecum performed by Dr. Bernette Redbird revealed  extremely severe Crohn proctocolitis.  Biopsies returned inflammation  involving the mucosa, submucosa, and muscularis.  There was no signs of C.  difficile colitis or CMV colitis.   On 12/19/2005 (after her TB skin test returned negative and her hepatitis B  surface antigen returned negative), she received intravenous Remicade 5  mg/kg.  She was also started on Imuran 25 mg daily.   On 12/21/2005, she developed a toxic megacolon type picture with dilation of  her transverse colon to 10.5 cm associated with abdominal pain and low grade  fever.  Her symptoms improved on a combination of intravenous Fortaz and  intravenous Flagyl.  The intravenous Elita Quick was discontinued after 5 days of  therapy, but she has remained on oral Flagyl since that episode.   [name] has been  on TNA for approximately 2-1/2 weeks.  She has been on  daily intravenous Solu-Medrol since her admission 12/09/2005.  Despite  receiving Remicade on 12/19/2005 and receiving Flagyl since 12/21/2005, and  starting Imuran 25 mg daily on 12/19/2005, her Crohn proctocolitis has not  significantly improved.  Her white blood cell count has normalized.  She  continues to have pain in the area of her transverse colon, which has  remained dilated between 8.5 cm to 10.5 cm.  Her lower gastrointestinal  bleeding has stopped, but she continues to have frequent watery stools.   On 12/24/2005, her hemoglobin dropped to 6.9 grams.  She received 2 units of   packed red blood cell transfusion to bring her hemoglobin back up to 12.5  grams.   On 12/29/2005, her hemoglobin was 10.2 grams and her white blood cell count  was 7800.  Her complete metabolic profile was normal.  Her serum albumin was  low at 1.7 and her prealbumin was low at 12.8, but climbing on TNA.   [name] has not responded to medical therapy for her severe Crohn  proctocolitis.  I have discussed proctocolectomy with permanent ileostomy  with [name].  With her consent, I am transferring her to [facility] to be evaluated by Dr. Rae Halsted for surgery.  Dr. Valarie Merino  office number in [place] is [phone].           ______________________________  Danise Edge, M.D.     MJ/MEDQ  D:  01/01/2006  T:  01/01/2006  Job:  [id number]

## 2010-10-11 NOTE — Op Note (Signed)
[name], [name]           ACCOUNT NO.:  [id number]   MEDICAL RECORD NO.:  [id number]          PATIENT TYPE:  INP   LOCATION:  6732                         FACILITY:  [facility]   PHYSICIAN:  Bernette Redbird, M.D.   DATE OF BIRTH:  Apr 13, 1951   DATE OF PROCEDURE:  12/09/2005  DATE OF DISCHARGE:                                 OPERATIVE REPORT   PROCEDURE:  Colonoscopy (partial) with biopsies.   INDICATIONS:  This is a 60 year old female with a long standing history of  inflammatory bowel disease, admitted by her primary gastroenterologist, Dr.  Reece Agar, about ten days ago.  She has been treated with intravenous  steroids and was transitioned over to oral steroids, but after showing some  initial improvement on this hospitalization, has failed to show further  improvement and, perhaps, has worsened slightly.  She continues to have  bloody diarrhea, significant abdominal tenderness, and today also had  vomiting.  In view of her absence of clinical improvement and apparent  intolerance oral steroid therapy, in terms of adequate treatment, updated  colonoscopic evaluation was felt to be appropriate.  Note that two months  ago, Dr. Henriette Combs colonoscopy showed severe inflammatory changes.   PROCEDURE:  The nature, purpose, and risks of the procedure were familiar to  the patient from prior examination.  She provided written consent.  Sedation  for this procedure in the upper endoscopy which preceded it totaled  Phenergan 12.5 mg IV, fentanyl 75 mcg IV, Versed 8 mg IV.  The Olympus  adjustable tension pediatric video colonoscope was advanced with  considerable difficulty through a very spastic inflamed rectosigmoid region  but then with greater ease around the colon to what I believe was the  hepatic flexure or perhaps even the proximal ascending colon, whereupon  there was some pasty brown stool coating the mucosa.  Note, that this  procedure was done unprepped.   Pullback was  then performed.  Throughout the remainder of the colon  (transverse colon, left colon, and rectosigmoid), there were severe, intense  inflammatory changes which I would rate as probably 8.9 on a scale of 10.  There was pseudopolyp formation, multiple irregular ulcers, areas of denuded  mucosa, with some intervening normal appearing mucosa.  The changes were  present throughout pretty much the entire colon up to the limit of the exam  including the rectum, but in somewhat skip fashion with some areas of  intervening normal mucosa, and overall this was felt to be most compatible  with severe Crohn's colitis rather than ulcerative colitis.  There were  areas of cobblestoning, for example.  I did not see any obvious  pseudomembranous colitis.  Multiple biopsies were obtained.  Pullback helped  rule out confounding conditions such as CMV colitis.  I did not retroflex in  the rectum.   The patient tolerated this procedure quite well and there no apparent  immediate complications.   IMPRESSION:  Severe inflammatory bowel disease activity at this time.   PLAN:  Await pathology results.  Intensive therapy is needed at this time.  The patient would probably be a good candidate for Remicade  but up to this  time has been declining to have it.  Accordingly, I will resume high dose IV  steroids in place of oral steroids, I will stop enemas in view of the severe  ulcerations in the rectosigmoid, and I will  update the patient's abdominal pelvic CT scan and blood work.  The patient  and I will then discuss various options which might include surgical  consultation, a trial of Remicade, or perhaps even transfer to a medical  center, versus continued observation IV steroids.  The patient may also be a  candidate for a PICC line and TNA.           ______________________________  Bernette Redbird, M.D.     RB/MEDQ  D:  12/09/2005  T:  12/09/2005  Job:  [id number]   cc:   Danise Edge, M.D.  Fax:  [phone]   Shan Levans, M.D. [facility]  [address]  [place]  [place] [address]   Candyce Churn. Allyne Gee, M.D.  Fax: [phone]

## 2010-10-11 NOTE — Op Note (Signed)
[name], [name] NO.:  [id number]   MEDICAL RECORD NO.:  [id number]          PATIENT TYPE:  INP   LOCATION:  1418                         FACILITY:  [facility]   PHYSICIAN:  Danise Edge, M.D.   DATE OF BIRTH:  [birth year]   DATE OF PROCEDURE:  09/24/2005  DATE OF DISCHARGE:                                 OPERATIVE REPORT   PROCEDURE INDICATIONS:  [name] is a 60 year old female born  Nov 11, 1950.  [name] was diagnosed with chronic Crohn's ileocolitis  in 1983.  She has not required intestinal surgery.   In 2003 proctocolonoscopy to the cecum revealed Crohn's proctitis and  Crohn's cecal colitis only.   In 2005, she was hospitalized to treat Crohn's colitis.  Proctocolonoscopy  to the cecum revealed universal Crohn's proctocolitis.  Her TB skin test was  negative in preparation for infusing Remicade.  Her colitis responded to a  combination of prednisone and Pentasa.  Remicade was not required.   In 2006, she underwent an esophagogastroduodenoscopy with small bowel  biopsies to rule out celiac sprue.  Her esophagogastroduodenoscopy and small  bowel biopsies were normal.   [name] was admitted to the hospital September 18, 2005 with abdominal cramps  fever and bloody diarrhea.  C. difficile toxin screen of her stool was  negative on two occasions.  Stool culture for enteric pathogens was  negative.   Despite oral prednisone and oral Pentasa, her colitis symptoms have not  significantly improved.   MEDICATIONS ALLERGIES:  Surgical gel.   PAST MEDICAL HISTORY:  1983 Crohn's ileocolitis diagnosed, severe chronic  obstructive pulmonary disease, total abdominal hysterectomy for cervical  cancer in situ, gastroesophageal reflux disease with a normal  esophagogastroduodenoscopy in 2006.  Hypertension.  Hyperlipidemia.  Steroid  myopathy.  Chronic prednisone use.   ENDOSCOPIST:  Danise Edge, M.D.   PREMEDICATION:  Demerol 75 mg,  Versed 7 mg.   PROCEDURE:  After obtaining informed consent, [name] was placed in the  left lateral decubitus position.  I administered intravenous Demerol and  intravenous Versed to achieve conscious sedation for the procedure.  The  patient's blood pressure, oxygen saturation and cardiac rhythm were  monitored throughout the procedure and documented in the medical record.   Proctocolonoscopy to the cecum reveals severe universal Crohn's  proctocolitis with mucosal friability many pseudopolyps and apparent luminal  narrowing in the proximal transverse colon.  There are mucosal skip areas  with relatively normal appearing mucosa in the distal sigmoid colon.  The  rectum, left colon and transverse colon are most severely inflamed;  inflammation is less in the right colon.   ASSESSMENT:  Severe universal Crohn's proctocolitis.   PLAN:  I will switch from oral prednisone to intravenous Solu-Medrol.  I  will continue oral Pentasa and mesalamine suppositories.  I do not think she  would tolerate mesalamine enemas.  Her TB skin test August 2005 was  negative.  If she does not respond to Solu-Medrol and Pentasa I will give  her a Remicade infusion.  I will also check her TPMT enzyme activity in  preparation for starting azathioprine.  ______________________________  Danise Edge, M.D.     MJ/MEDQ  D:  09/24/2005  T:  09/24/2005  Job:  [id number]   cc:   [name], M.D. [facility]  [address]  [place]  [place] [address]   Candyce Churn. Allyne Gee, M.D.  Fax: [phone]

## 2010-10-11 NOTE — H&P (Signed)
[name], [name]           ACCOUNT NO.:  [id number]   MEDICAL RECORD NO.:  [id number]          PATIENT TYPE:  INP   LOCATION:  4142                         FACILITY:  [facility]   PHYSICIAN:  Shan Levans, M.D. LHCDATE OF BIRTH:  1951-05-03   DATE OF ADMISSION:  03/27/2004  DATE OF DISCHARGE:                                HISTORY & PHYSICAL   CHIEF COMPLAINT:  Bilateral pneumonia with respiratory distress.   HISTORY OF PRESENT ILLNESS:  This is a 60 year old African American female  with severe chronic obstructive lung disease, asthmatic bronchitis, exsmoker  with bilateral infiltrates on chest x-ray here in the office, coughing up  bloody mucus, and fever up to 100 degrees with back and rib cage pain  bilaterally.  She is admitted now for further inpatient care.  History is  significant that she was hospitalized earlier this year for a bout of  respiratory failure with COPD exacerbation.  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 This was between February and April  of 2005.   She was later hospitalized in July 2005 for Crohn's proctocolitis and  discharged in August.  Since that time, she has been doing well until more  recently here with this current illness.   PAST MEDICAL HISTORY:  1.  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  2.  History of deconditioning.  3.  History of severe COPD.  4.  Hypertension.  5.  Crohn's disease.  6.  Anxiety disorder.  7.  Gastroesophageal reflux disease.  8.  No history of diabetes or CVA.   PAST SURGICAL HISTORY:  Hysterectomy.   SOCIAL HISTORY:  Lives with her mother in a one-level home.  She is married.  Lives with her husband, mother and sister.  Still occasionally smoking.   FAMILY HISTORY:  Noncontributory.   REVIEW OF SYMPTOMS:   Noncontributory.   CURRENT MEDICATIONS:  1.  Acolate 20 mg b.i.d.  2.  Mucinex 1 b.i.d.  3.  Prednisone was 10 mg q.d.  4.  Clonazepam 0.25 mg q.a.m. and 1 mg h.s.  5.  Nexium 40 mg q.d.  6.  Pentasa 500 mg t.i.d.  7.  Multivitamins q.d.  8.  Spiriva 1 capsule q.d.  9.  Oxygen 2 liters h.s.  10. Advair 250/50 with 1 spray b.i.d.  11. K-Dur 20 mEq q.d.   PHYSICAL EXAMINATION:  VITAL SIGNS:  Temperature 98, blood pressure 98/60,  pulse 108.  Saturation 92% room air.  GENERAL:  This is an ill-appearing African American female in mild  respiratory distress.  CHEST:  Showed inspiratory and expiratory wheeze with poor air movement.  CARDIOVASCULAR:  Regular rate and rhythm without S3.  Normal S1 and S2.  ABDOMEN:  Soft and nontender.  EXTREMITIES:  No edema or clubbing.  SKIN:  Clear.  NEUROLOGICAL:  Intact.  NECK:  No jugular venous distention.  No lymphadenopathy.  Oropharynx clear.   LABORATORY DATA:  Chest x-ray showed bilateral lower lobe infiltrates with  COPD changes.  IMPRESSION:  Bilateral lower lobe pneumonia, community-acquired with  associated hypercarbic and hypoxic respiratory failure in a patient with  advanced chronic obstructive lung disease.   RECOMMENDATIONS:  Admit to regular room.  Give IV antibiotics, IV steroids,  intensive nebulizer treatments.  Follow the patient expectantly.      Patr   PW/MEDQ  D:  03/28/2004  T:  03/28/2004  Job:  [id number]   cc:   Margaretmary Bayley, M.D.  [address]  [place]  [place] [address]  Fax: [phone]   Candyce Churn. Allyne Gee, M.D.  [address]  [address]  [place]  [place] [address]  Fax: [phone]

## 2010-10-11 NOTE — Discharge Summary (Signed)
[name], [name]           ACCOUNT NO.:  [id number]   MEDICAL RECORD NO.:  [id number]          PATIENT TYPE:  INP   LOCATION:  1526                         FACILITY:  [facility]   PHYSICIAN:  Angelia Mould. Derrell Lolling, M.D.DATE OF BIRTH:  Jan 04, 1951   DATE OF ADMISSION:  11/25/2007  DATE OF DISCHARGE:  11/29/2007                               DISCHARGE SUMMARY   FINAL DIAGNOSIS:  1. Acute narcotic-induced ileus versus partial small-bowel      obstruction, resolved.  2. Vaginal bleeding of uncertain etiology.  3. Right hip pain, probably due to arthritis.  4. Severe chronic obstructive pulmonary disease., steroid and oxygen-      dependent.  5. Hypertension.  6. Status post total proctocolectomy and ileostomy for Crohn's      disease, remote.  7. Status post total abdominal hysterectomy.   OPERATIONS PERFORMED:  None.   HISTORY:  This is a 60 year old black female with severe COPD.  She  presented to the emergency room giving a 2-week history of right groin  pain which was severe and mild diffuse non-crampy abdominal discomfort.  She has been able to eat but does have occasional vomiting.  She says  her ileostomy is working fine.  She has a history of subtotal  proctocolectomy with ileostomy for Crohn's disease in the past.   Prior to her presentation in the emergency room.  She had seen multiple  doctors.  She came to the [facility] Emergency Room on June 22 for her right  groin pain and was told it was musculoskeletal and was given a  prescription for pain medicine.  She saw Dr. Aura Camps the week  prior to admission and had an evaluation for her vaginal bleeding.  She  has been seen by Dr. Regino Schultze, Orthopedic Surgeon, and was told that she had  sciatica and was given another prescription for hydrocodone a few days  prior to this presentation.  She states that she had some vaginal  bleeding which she was discussed with Dr. Eda Paschal.   Dr. Eda Paschal ordered a CT scan of the abdomen  and pelvis which showed  some dilated small bowel and a question of a transition zone in the  right lower quadrant under her ileostomy suggesting adhesions and  possible partial small-bowel obstruction.  Also noted was a ventral  hernia and a possible small peristomal hernia, but no sign of any  inflammatory process or ischemia.  After being evaluated by the  emergency department physician I was called to evaluate her.   PHYSICAL EXAM:  Alert, short-statured, black female in mild distress.  Cooperative, somewhat tearful and anxious.  ABDOMEN:  Was somewhat protuberant, bowel sounds active, midline scar.  There was an ileostomy in the right lower quadrant.  There was some mild  diffuse tenderness but no guarding or rebound or signs of peritonitis.  She had a ventral hernia in the midline which was soft and nontender and  easily reducible.  I did not feel any tenderness or mass or hernia  around her ileostomy.  There was lots of stool and flatus in the  ileostomy bag.   ADMISSION DATA:  CT scan was done which is described above.  White blood  cell count 10,200, hemoglobin 11.7, white blood cell count differential  normal.  Basic metabolic panel normal.  Urinalysis normal.   HOSPITAL COURSE:  The patient was admitted and placed on bowel rest and  observation.  I did not feel that she had any compromised bowel and I  was skeptical as to whether she had a bowel obstruction given the  ongoing output from her ileostomy.  She was followed throughout her  hospitalization by the Internal Medicine Hospitalist Service who  assisted in management of her medical problems.   The following day her pain was better and she was ready to eat having no  further nausea or vomiting.  Her abdominal exam was soft and she was  started on a diet.  She tolerated that fairly well and on July 6 she was  ready to go home.  She had tolerated regular diet, had good ostomy  function.  Her vaginal bleeding was minimal  at best.  Urinalysis was  repeated and was normal.   She was allowed to go home on July 6.  She was told that most likely she  had had an acute ileus due to the narcotic use and to restrict her use  of that.  In terms of her vaginal bleeding, the etiology of that was  uncertain and she was asked to follow up with Dr. Eda Paschal and she said  that she would call and make an appointment with him for that.  In terms  of her right groin and right hip pain, that was felt to be a chronic  musculoskeletal problem and she was asked to follow up with Dr. Regino Schultze,  her orthopedic surgeon, regarding that.   She stated that she would follow up with her surgeon in [facility]  regarding the reducible ventral hernia in the incision that she had for  her proctocolectomy.  She will follow-up with me p.r.n..      Angelia Mould. Derrell Lolling, M.D.  Electronically Signed     HMI/MEDQ  D:  12/21/2007  T:  12/21/2007  Job:  [id number]   cc:   Candyce Churn. Allyne Gee, M.D.  Fax: [phone]   Rande Brunt. Eda Paschal, M.D.  Fax: [phone]   Danise Edge, M.D.  Fax: [phone]

## 2010-10-11 NOTE — Discharge Summary (Signed)
[name], [name]           ACCOUNT NO.:  [id number]   MEDICAL RECORD NO.:  [id number]          PATIENT TYPE:  INP   LOCATION:  1418                         FACILITY:  [facility]   PHYSICIAN:  Danise Edge, M.D.   DATE OF BIRTH:  [birth year]   DATE OF ADMISSION:  09/17/2005  DATE OF DISCHARGE:  09/28/2005                                 DISCHARGE SUMMARY   DISCHARGE DIAGNOSIS:  Chronic universal Crohn's ileocolitis diagnosed 1983.   DISCHARGE MEDICATIONS:  1.  Prednisone 30 mg each morning for two weeks, 25 mg each morning for one      week, 20 mg each morning for one week, 15 mg each morning for one week,      10 mg each morning for one week, and then resume 5 mg each morning.  2.  Pentasa 1500 mg twice daily.  3.  ADVAIR 250/50 one inhalation b.i.d.  4.  Accolate 20 mg b.i.d.  5.  Alprazolam at night p.r.n.  6.  Aspirin 81 mg each morning.  7.  Deplin 7.5 mg daily.  8.  Generic Allegra 180 mg nightly.  9.  Fosamax oral solution 70 mg weekly.  10. Furosemide 20 mg each morning.  11. Lexapro 10 mg each evening.  12. Lovastatin 20 mg each evening.  13. Mucinex 1200 mg b.i.d.  14. Nasal oxygen at 2 L per minute.  15. Protonix 40 mg each morning.  16. Premarin 0.3 mg each morning.  17. Spiriva hand inhaler one inhalation each evening.  18. Multivitamin daily.  19. Xopenex inhalation solution 1.2 mg per 3 mL by nebulizer q.i.d.   OFFICE FOLLOW-UP:  I have asked [name] to see me in the office in  approximately two weeks.   HOSPITAL COURSE:  [name] is a 60 year old female born 08/17/1950.  She was diagnosed with chronic with Crohn's ileocolitis in 1983.  She has not required intestinal surgery.   She underwent a proctocolonoscopy to the cecum in 2003 which revealed  Crohn's proctitis and Crohn's cecal colitis only.   In 2005 she underwent repeat proctocolonoscopy to the cecum which revealed  universal Crohn's proctocolitis.   In 2006 she  underwent an esophagogastroduodenoscopy with small bowel biopsy  to rule out celiac sprue; esophagogastroduodenoscopy and small bowel  biopsies were normal.   On May 27, 2005 she underwent a flexible proctosigmoidoscopy in my office  to evaluate painless hematochezia; examination revealed distal proctitis  only.   [name] has severe chronic obstructive pulmonary disease and chronically  takes a small dose of prednisone.  She did require mechanical ventilation in  the past to treat steroid-induced myopathy.   On September 18, 2005 she was admitted to [facility] to evaluate  diarrhea with hematochezia, abdominal cramps, and a low-grade fever  unassociated with vomiting.   On admission in the emergency room she underwent a CT scan of the abdomen  with contrast which revealed very mild wall thickening of the proximal and  distal colon without abscess formation or bowel obstruction.  Her appendix  appeared normal.  CT scan of the pelvis revealed mild  thickening involving  the proximal and distal colon.   [name] was admitted to the hospital to manage an exacerbation of her  Crohn's proctocolitis.  She was started on oral prednisone plus Pentasa.  Stool culture, blood culture, and stool screen for C. difficile toxin were  all negative.  On oral prednisone and Pentasa her symptoms persisted.   On Sep 24, 2005 she underwent proctocolonoscopy to the cecum which revealed  severe universal Crohn's proctocolitis associated with mucosal friability,  many pseudopolyps, and luminal narrowing in the proximal transverse colon.  There were mucosal skip areas with normal-appearing mucosa in the distal  sigmoid colon.  The left colon, rectum, and transverse colon were most  severely inflamed; the right colon appeared less inflamed.   She was switched from oral prednisone 30 mg to intravenous Solu-Medrol 40 mg  daily.  She clinically improved and was switched back to oral prednisone at   30 mg plus Pentasa.  At discharge she was tolerating a regular diet and  medically stable for discharge.   On December 25, 2003 she did undergo a TB skin test which was negative in  preparation to the use of Remicade, but never required Remicade.   Prior to discharge I obtained a TPMT enzyme activity which was in the lowish  enzyme activity level.  I did not have to start azathioprine.  If she does  require azathioprine the dose will need to be adjusted for her lowish TPMT  enzyme level.   LABORATORY DATA:  Discharge white blood cell count 9700, hemoglobin 9.6 g,  platelet count 510,000, MCV 85.6.  Complete metabolic profile was abnormal  for an albumin 3.1 and mildly elevated glucose while on a high  dose of Solu-Medrol.  Urine pregnancy test was negative.  Urinalysis on  admission revealed a small level of ketones in the urine.  Stool culture,  blood culture negative.  Stool C. difficile toxin negative.  TPMT enzyme  level was in the lowish range.           ______________________________  Danise Edge, M.D.     MJ/MEDQ  D:  10/13/2005  T:  10/14/2005  Job:  [id number]   cc:   Candyce Churn. Allyne Gee, M.D.  Fax: [phone]   Shan Levans, M.D. [facility]  [address]  [place]  [place] [address]

## 2010-10-11 NOTE — Op Note (Signed)
[name], [name]           ACCOUNT NO.:  [id number]   MEDICAL RECORD NO.:  [id number]          PATIENT TYPE:  INP   LOCATION:  6732                         FACILITY:  [facility]   PHYSICIAN:  Bernette Redbird, M.D.   DATE OF BIRTH:  1950-10-14   DATE OF PROCEDURE:  12/09/2005  DATE OF DISCHARGE:                                 OPERATIVE REPORT   PROCEDURE:  Upper endoscopy.   INDICATIONS:  60 year old female with long standing inflammatory bowel  disease, now hospitalized for the past ten days with an exacerbation, and  having nausea and vomiting.   FINDINGS:  Normal exam.   PROCEDURE:  The nature, purpose, and risks of the procedure were familiar to  the patient from prior examination by Dr. Reece Agar and she provided  written consent.  Sedation was Phenergan 12.5 mg, fentanyl 50 mcg, and  Versed 4 mg IV without clinical instability or desaturation.  The Olympus  video endoscope was passed under direct vision.  The vocal cords looked  grossly normal.  The esophagus was entered without significant difficulty  and had normal mucosa without evidence of reflux esophagitis, Barrett's  esophagus, varices, infection or neoplasia.  No ring, stricture, or  significant hiatal hernia was appreciated.  The stomach contained no  significant residual and had normal mucosa without evidence of gastritis,  erosions, ulcers, polyps or masses including a retroflexed view of the  cardia, and the pylorus, duodenal bulb and second duodenum looked normal.  The scope was removed from the patient who tolerated the procedure well  without any apparent complication.   IMPRESSION:  Normal endoscopy, without source of nausea and vomiting  endoscopically evident (787.01).   PLAN:  Proceed to colonoscopic evaluation.           ______________________________  Bernette Redbird, M.D.     RB/MEDQ  D:  12/09/2005  T:  12/09/2005  Job:  [id number]   cc:   Danise Edge, M.D.  Fax: [phone]   Candyce Churn.  Allyne Gee, M.D.  Fax: [phone]   Shan Levans, M.D. [facility]  [address]  [place]  [place] [address]

## 2010-10-11 NOTE — Discharge Summary (Signed)
NAMEJUNO, [name]           ACCOUNT NO.:  [id number]   MEDICAL RECORD NO.:  [id number]          PATIENT TYPE:  INP   LOCATION:  3018                         FACILITY:  [facility]   PHYSICIAN:  Shan Levans, M.D. LHCDATE OF BIRTH:  1950/07/31   DATE OF ADMISSION:  03/27/2004  DATE OF DISCHARGE:  04/01/2004                           DISCHARGE SUMMARY - REFERRING   DISCHARGE DIAGNOSES:  1.  Bilateral pneumonia with underlying end-stage chronic obstructive      pulmonary disease.  2.  Right lower lobe nodule, questionable.   HISTORY OF PRESENT ILLNESS:  [name] is a 60 year old African  American female with known severe chronic obstructive pulmonary disease,  asthmatic bronchitis with demonstrated bilateral infiltrates on chest x-ray  in the office.  Of note, she was positive for sputum production with some  hemoptysis.  She also had a fever greater than 100 degrees with back and rib  cage pain bilaterally.  She is admitted for further evaluation and  treatment.   LABORATORY DATA:  Legionella was negative.  Sodium 137, potassium 3.8,  chloride 104, CO2 25, glucose 86, BUN 10, creatinine 0.8, calcium 8.8.  Arterial blood gas on room air with pH 7.42, pCO2 35, pO2 67 with bicarb of  22.8.  A WBC of 18.9, hemoglobin 12.1, hematocrit 36.3, platelets 267.   Chest x-ray shows stable bilateral subsegmental atelectasis.  Findings  concerned for right upper lobe nodule.  Recommend a CT of chest for complete  evaluation.   HOSPITAL COURSE BY DISCHARGE DIAGNOSES:  #1 - BILATERAL PNEUMONIA WITH  UNDERLYING SEVERE CHRONIC OBSTRUCTIVE PULMONARY DISEASE:  She was admitted  to the [facility] and treated with usual pharmaceutical  interventions with IV steroids, IV antibiotics and nebulized  bronchodilators.  She reached maximal hospital benefit by April 01, 2004.  Of note, she will continue on O2 at two liters, 24 hours a day.  She has  also been placed on  albuterol nebulizers along with continuation of her  Advair and Accolate.  Also of concern, she had a right lower lobe nodule.  This was identified on chest x-ray as a questionable find.  Therefore, on  her follow-up with Dr. Shan Levans she will have a CT of the chest to  rule out pulmonary nodule.   #2 - CHRONIC OBSTRUCTIVE PULMONARY DISEASE:  This was treated with the usual  pharmaceutical interventions.  Note, she is now O2 dependent at 2 liters 24  a day.   MEDICATIONS:  1.  Home O2 at 2 liters 24 hours a day.  2.  Albuterol 2.5 nebulizer every four to six hours.  3.  Mucinex 1200 mg three times a day.  4.  Accolate 20 mg three times a day.  5.  Klonopin 0.25 mg daily.  6.  Nexium 40 mg one daily.  7.  Pentasa 500 mg t.i.d.  8.  K-Dur 20 mg once a day.  9.  Advair 250/50 one puff two times a day.  10. Multivitamin once a day.  11. Avelox 400 mg one a day for five days.  12. Lasix 20 mg once a  day.  13. Cipro.  14. Hydrosone.  15. Otic ear drops two drops left ear two times a day for the next seven      days.  16. Prednisone 10 mg, 30 mg x5 days, 20 mg x5 days, 10 mg a day and then      stay.  17. She is also on the research drug.  I explained the trial which is      Lovenox versus placebo versus hospitalize patient's for DVT prophylaxis.   She will follow up with Maylene Roes of the [facility] for further  evaluation and treatment in the double blind study.  Of note, Dr. Shan Levans is in charge of this segment of the  exclaimed trial at [facility]. [facility].  Pager number is [phone], should any questions  arise.   DISPOSITION/CONDITION ON DISCHARGE:  Improved.   DIET:  As tolerated.   Of special note, she will utilize her flutter valve three times a day.  She  will have follow-up appointment with nurse practitioner, April 11, 2004,  and also should be evaluated by Michaele Offer  trial at that time.  She has a follow-up  appointment with Dr. Shan Levans on April 30, 2004,  at which time she will have a CT of the chest.      Stev   SM/MEDQ  D:  04/01/2004  T:  04/01/2004  Job:  [id number]

## 2010-10-11 NOTE — Op Note (Signed)
NAME:  [name], [name]     ACCOUNT NO.:  [id number]   MEDICAL RECORD NO.:  [id number]          PATIENT TYPE:  AMB   LOCATION:  ENDO                         FACILITY:  [facility]   PHYSICIAN:  Danise Edge, M.D.   DATE OF BIRTH:  1950/11/17   DATE OF PROCEDURE:  05/08/2006  DATE OF DISCHARGE:                               OPERATIVE REPORT   PROCEDURE PERFORMED:  Esophagogastroduodenoscopy, Savary esophageal  dilation, and proctoscopic exam.   INDICATIONS:  [name] is a 60 year old female born  08/15/1950.  [name] was diagnosed with Crohn's colitis by  barium enema in 1983.  In mid July 2007, she was transferred from [facility] to [facility] in [place] to undergo  colectomy and ileostomy to manage severe Crohn's colitis.  Two weeks  after her surgery she developed a duodenal ulcer perforation which was  managed nonoperatively.  She required embolization of her gastroduodenal  artery to control ulcer related bleeding.  While at [facility], she did  require endoscopy and esophageal dilation to treat an esophageal  stricture.  [name] has redeveloped esophageal dysphagia and is  passing bloody mucus from her rectum.  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   ENDOSCOPIST:  Danise Edge, M.D.   PREMEDICATION:  Despite receiving 125 mcg of fentanyl and 12 mg of  Versed, [name] was not adequately sedated for the procedure.  If she  requires repeat esophageal dilation, I would recommend using propofol  and general anesthesia.   PROCEDURE:  Esophagogastroduodenoscopy and Savary esophageal dilation.  After obtaining informed consent [name] was placed in the left  lateral decubitus position on the fluoroscopy table.  I administered  intravenous fentanyl and intravenous Versed to achieve conscious  sedation for the procedure.  The patient's blood pressure, oxygen  saturation and cardiac rhythm were monitored  throughout the procedure  and documented in the medical record.   The Olympus gastroscope was passed through the posterior hypopharynx  into the proximal esophagus without difficulty.  The hypopharynx, larynx  and vocal cords appeared normal.   Esophagoscopy:  The proximal and mid segments of the esophageal mucosa  appears normal.  There is distal esophageal mucosal scarring and  stricture formation which did not offer resistance to the passage of the  Pentax gastroscope into the stomach.   Gastroscopy:  Retroflex view of the gastric cardia and fundus was  normal.  The gastric body, antrum and pylorus appeared normal.   Duodenoscopy:  The duodenal bulb appears normal.  There is mucosal  narrowing at the junction of the duodenal bulb and second portion of  duodenum.  The second portion of the duodenum appears normal.   Savary esophageal dilation:  The Savary dilator wire was passed through  the gastroscope and tip of the guidewire advanced to the distal gastric  antrum.  Fluoroscopy was requested but the image could not be adequately  projected and fluoroscopy was not utilized.  The 12-mm and 12.8-mm  Savary dilators passed without resistance but the patient did experience  significant pain due more to inadequate sedation than anything else.  I  did not pass  any further dilators.  Repeat esophagogastrostomy did not  show significant dilation of the distal esophageal stricture and no  gastric trauma due to the guidewire.   ASSESSMENT:  Distal esophageal stricture dilated with the 12-mm and 12.8-  mm Savary dilator under inadequate conscious sedation despite the  patient receiving 125 mcg of fentanyl and 12 mg of Versed.  At that  point, her systolic blood pressure was 80 and I did not think it wise to  give her any more conscious sedation.   If repeat esophageal dilation is required in the future, and undoubtedly  it will be required, I would recommend using propofol anesthesia   administered by the anesthesiologist for the procedure.   PROCEDURE:  Proctoscopic exam.  Anal inspection is normal.  I do not see  any signs of Crohn's disease.  Proctoscopic exam shows the distal rectal  mucosa studded with many vascular ectasia type lesions with a friable  mucosa.  There are no ulcers present.  The proximal rectum has less  involvement.  Biopsies were taken.  If under the microscope it appears  she has a recurrence in her Crohn's colitis, I will use mesalamine.  She  could also have a diversion colitis or these could be vascular ectasias  that will require Argon plasma coagulation.           ______________________________  Danise Edge, M.D.     MJ/MEDQ  D:  05/08/2006  T:  05/08/2006  Job:  [id number]   cc:   Charlcie Cradle. [name], MD, FCCP

## 2010-10-11 NOTE — Consult Note (Signed)
[name], [name]           ACCOUNT NO.:  [id number]   MEDICAL RECORD NO.:  [id number]          PATIENT TYPE:  INP   LOCATION:  6732                         FACILITY:  [facility]   PHYSICIAN:  Gabrielle Dare. Janee Morn, M.D.DATE OF BIRTH:  May 29, 1950   DATE OF CONSULTATION:  12/21/2005  DATE OF DISCHARGE:                                   CONSULTATION   CHIEF COMPLAINT:  Severe Crohn's colitis.   HISTORY OF PRESENT ILLNESS:  The patient is a 60 year old African American  female who was admitted with Crohn's exacerbation in November 27, 2005.  She has  been treated medically.  Remicade was recently started.  She has been having  diarrhea and abdominal pain.  Recent abdominal films did show dilation of  the transverse colon.  This increased to approximately 10.4 cm on July 27th.  Follow-up films today showed essentially stable colonic dilatation to 9.9 cm  with no free air.  The patient has mild abdominal pain and diarrhea today.  There has been no blood in her diarrhea.   PAST MEDICAL HISTORY:  1.  Crohn's.  2.  COPD.  She does use oxygen at home.   PAST SURGICAL HISTORY:  Benign.   SOCIAL HISTORY:  She does not smoke or drink alcohol.   ALLERGIES:  NEXIUM.   REVIEW OF SYSTEMS:  GENERAL:  She has some mild malaise.  CARDIAC:  Negative.  PULMONARY:  No acute worsening shortness of breath.  GI:  See  history of present illness.  GU:  Negative.  NEUROPSYCHIATRIC:  She is being  followed by Dr. Jeanie Sewer here in the hospital.   PHYSICAL EXAMINATION:  VITAL SIGNS:  Temperature 98.4, blood pressure 92/56,  heart rate 104, respiration 20.  GENERAL:  She is awake and alert.  NECK:  Supple.  LUNGS:  Clear to auscultation with no significant wheezing.  HEART:  Regular.  No murmurs heard.  ABDOMEN:  Distended, but soft.  There is some mild diffuse tenderness, but  no guarding or signs of peritonitis.  EXTREMITIES:  Mild peripheral edema.  SKIN:  Warm and dry with no rashes.   IMPRESSION:  1.   Severe Crohn's colitis with colonic dilatation.  2.  No evidence of perforation at this time.  3.  The patient is currently at risk for toxic megacolon.   RECOMMENDATIONS:  At this point I would continue aggressive medical  treatment and bowel rest.  If the patient goes on to develop sepsis,  perforation, or worsening clinical status, she will need to undergo  abdominal colectomy with ileostomy.  We will follow her very closely with  you.   Thank you very much for this consultation.      Gabrielle Dare Janee Morn, M.D.  Electronically Signed     BET/MEDQ  D:  12/21/2005  T:  12/21/2005  Job:  [id number]   cc:   Danise Edge, M.D.  Fax: [phone]

## 2010-10-11 NOTE — Consult Note (Signed)
[name], COURTER           ACCOUNT NO.:  [id number]   MEDICAL RECORD NO.:  [id number]          PATIENT TYPE:  INP   LOCATION:  6732                         FACILITY:  [facility]   PHYSICIAN:  Antonietta Breach, M.D.  DATE OF BIRTH:  1950/12/21   DATE OF CONSULTATION:  12/15/2005  DATE OF DISCHARGE:  11/11/2005                                   CONSULTATION   HISTORY:  Ms. Yount has not had any side effects from Remeron.  She is  still having a lot of pain, and she is continuing with bloody stool.  Her  sleep has improved.  Her mood is still decreased.  Energy is still  decreased.  Concentration is still decreased.  Her feeling-on-edge is  better.   EXAMINATION:  As above, vital signs:  Temperature 98.9, pulse 95,  respiration 20, blood pressure 112/66, O2 saturation on room air is 98%.  The patient has required for p.r.n.'s Xanax 0.25 mg at 2000 last night, and  no further Xanax p.r.n. today.  She has not had any side effects.   ASSESSMENT:  AXIS I:  Mood disorder, not otherwise specified, depressed (functional and general  medical factors).  Anxiety disorder, not otherwise specified.  293.84   RECOMMENDATIONS:  Continue the Remeron at 15 mg nightly as the initial trial  dose, and monitor for depression improvement, as well as helping with acute  anxiety.      Antonietta Breach, M.D.  Electronically Signed     JW/MEDQ  D:  12/16/2005  T:  12/16/2005  Job:  [id number]

## 2010-10-11 NOTE — Consult Note (Addendum)
[name], [name]           ACCOUNT NO.:  [id number]   MEDICAL RECORD NO.:  [id number]          PATIENT TYPE:  INP   LOCATION:  6732                         FACILITY:  [facility]   PHYSICIAN:  Antonietta Breach, M.D.  DATE OF BIRTH:  1950/11/07   DATE OF CONSULTATION:  12/29/2005  DATE OF DISCHARGE:                                   CONSULTATION   CONSULTATION FOLLOWUP   Ms. Jacober continues to have normal interests and hope for the future.  Her  energy is still decreased.  She sleeps well at night, other than when the  pain is increased.  She is not having any adverse Remeron effects.  She is  socially appropriate and cooperative without hallucinations.   Her SGOT was 15, SGPT 25.  Temperature 98.1.  Pulse 105.  Respiration 18.  Blood pressure 96/63.  O2 saturation on 2 liters is 96%.   MENTAL STATUS EXAM:  [name] is alert.  She is oriented to all spheres,  thought process, logical, coherent and goal directed thought content, no  thoughts forming, ____ QA MARKER: 78 ____.  No   hallucinations.  Memory within normal limits.  Concentration within normal  limits.  Affect slightly anxious.  Judgment within normal limits.   ASSESSMENT:  293.83 mood disorder not otherwise specified, depressed,  improved (not otherwise specified category used because the patient's  depression has involved both functional and general medical factors).   RECOMMENDATIONS:  We continue the Remeron at 15 mg for antidepression and  would have the patient followup with psychiatry during her hospital course.      Antonietta Breach, M.D.  Electronically Signed     JW/MEDQ  D:  12/29/2005  T:  12/30/2005  Job:  [id number]

## 2010-10-11 NOTE — Consult Note (Signed)
[name], [name]           ACCOUNT NO.:  [id number]   MEDICAL RECORD NO.:  [id number]          PATIENT TYPE:  INP   LOCATION:  6732                         FACILITY:  [facility]   PHYSICIAN:  Antonietta Breach, M.D.  DATE OF BIRTH:  [birth year]   DATE OF CONSULTATION:  12/23/2005  DATE OF DISCHARGE:                                   CONSULTATION   SUBJECTIVE:  [name] has improved mood this week.  Her energy is still  decreased, however, her hope is solid.  She is able to enjoy comfort.  She  has no hallucinations, no delusions, no thoughts of harming herself or  others.  She is not having any adverse Remeron effects and she reports  improved nausea.   The patient's abdominal pain has continued to be very problematic and her  Crohn's exacerbation continues, however, her abdominal pain is improved at  the moment.  She is continuing to require medical inpatient stay at this  time.   PHYSICAL EXAMINATION:  On exam, as above, thought process logical, coherent,  and goal-directed.  No looseness of associations, thought content.  No  thoughts of harming herself, no thoughts of harming others, and no  delusions, no hallucinations.  Memory is intact to immediate, recent, and  remote.  Concentration is slightly decreased.  Affect is mildly constricted  at baseline with a broad appropriate response.  Insight is good.  Judgment  is good.  The patient is oriented to all spheres.  Follow up CBC shows a  white blood cell count of 15.1, hemoglobin is decreased at 8.2, platelet  count 349,000.  Vital signs:  Temperature 98.8, pulse 112, respiration 22, blood pressure  107/62, O2 saturation 98% on 2 liters.   ASSESSMENT:   AXIS I:  293.83 mood disorder, not otherwise specified, depressed (general  medical factors are involved).   RECOMMENDATIONS:  Continue the Remeron at 15 mg nightly for anti-depression  as well as the beneficial side effects of anti-nausea (through blocking the  5HT3  receptor).  Will consider increasing the Remeron as the patient  proceeds through her course.      Antonietta Breach, M.D.  Electronically Signed     JW/MEDQ  D:  12/23/2005  T:  12/23/2005  Job:  [id number]

## 2010-10-11 NOTE — Assessment & Plan Note (Signed)
DATE OF BIRTH:  25-Feb-1951.   MEDICAL RECORD NUMBER:  [id number].   [name] returns today. She was at [facility] rehab August 11, 2003 to September 07, 2003 for deconditioning and steroid myopathy. She has a  history of severe chronic obstructive pulmonary disease, had a long  hospitalization at [facility], and was a dependent for at least two weeks.  She was admitted originally July 20, 2003.   She has returned to home where she lives with her husband. Her mother  assists as well. At the time of discharge, she was 150 feet modified  independent, and this was with a rolling walker. She no longer uses a  walker. She has had home health therapy, and she has been discharged from  home health PT and OT as well as home health nursing. She is now washing  dishes, doing laundry, and making beds. She walks to the mailbox at times to  get the mail although some day she does not feel up to it. Mobility is  limited by weather as well, cannot go out on high ozone days or when the  humidity is very high.   Vocational, she has been disabled since April 2000.   REVIEW OF SYSTEMS:  Positive for shortness of breath due to her respiratory  imperfections, wheezing, weakness, anxiety, poor sleep, weight gain, and  bruising.   INTERVAL HISTORY:  Continued to wean off the prednisone down to 5 mg a day.  Start on amoxicillin clavulanate for pharyngitis.   PHYSICAL EXAMINATION:  GENERAL:  No acute distress. Mood and affect  appropriate. Blood pressure 117/56, pulse 93, respirations 14, O2 98%.  Appearance is normal.  GAIT:  No evidence of toe drag or knee instability. She is able to toe walk  and heel walk; toe walking is somewhat easier than heel walking. She has 5/5  strength in bilateral deltoid, biceps, triceps grip; 4/5 bilateral hip  flexors; 5/5 bilateral knee extensors; 4/5 bilateral ankle dorsi flexors.  Her deep tendon reflexes are 1+ bilateral upper and lower extremities.   IMPRESSION:  Steroid myopathy, improved, still has some mild residual hip  flexor as well as ankle dorsi flexor weakness. I do not think she needs any  formalized therapy program to keep up with the home health exercises. I have  encouraged to do increasing activity, consider pool therapy if she can  tolerate this from her respiratory standpoint.   I will see her back on a p.r.n. basis. She will follow up with Dr. Danise Mina.      Erick Colace, M.D.   AEK/MedQ  D:  10/26/2003 12:43:55  T:  10/26/2003 14:42:45  Job #:  [id number]   cc:   Shan Levans, M.D. [facility]

## 2010-10-11 NOTE — Assessment & Plan Note (Signed)
[facility]                             PULMONARY OFFICE NOTE   AMENAH, TUCCI                  MRN:          [id number]  DATE:07/07/2006                            DOB:          [birth year]    Ms. Alejos returns today in follow up.  A 60 year old [place] American  female with history of asthmatic bronchitis, chronic obstructive lung  disease, overall has improved.  She is stable from a GI standpoint.  She  has had no active respiratory complaints.  Maintains Spiriva daily,  Prednisone 5 mg daily, Accolate 20 mg b.i.d., Advair 250/50 one spray  b.i.d.   EXAMINATION:  Temperature 98, blood pressure 116/72, pulse 102,  saturation was 100% on 2 liters.  CHEST:  Diminished breath sounds without evidence of wheeze or rhonchi.  CARDIAC:  Showed a regular rate and rhythm without S3, normal S1, S2.  ABDOMEN:  Soft, nontender.  EXTREMITIES:  Showed no edema or clubbing.   IMPRESSION:  Chronic obstructive lung disease, asthmatic bronchitic  component.  Stable airflow function at this time.   PLAN:  The patient is to maintain inhaled medicines as currently dosed.  We will see the patient back in follow up.     Charlcie Cradle [name], MD, [facility]  Electronically Signed    PEW/MedQ  DD: 07/07/2006  DT: 07/08/2006  Job #: [id number]   cc:   Danise Edge, M.D.  Candyce Churn. Allyne Gee, M.D.

## 2010-10-11 NOTE — Op Note (Signed)
NAMEGRAYCEE, [name]     ACCOUNT NO.:  [id number]   MEDICAL RECORD NO.:  [id number]          PATIENT TYPE:  AMB   LOCATION:  ENDO                         FACILITY:  [facility]   PHYSICIAN:  Danise Edge, M.D.   DATE OF BIRTH:  1951-05-15   DATE OF PROCEDURE:  06/25/2006  DATE OF DISCHARGE:                               OPERATIVE REPORT   PROCEDURE PERFORMED:  Esophagogastroduodenoscopy with Savary esophageal  dilation.   INDICATIONS FOR PROCEDURE:  Ms. [name] is a 60 year old  female born December 28, 1950.  Ms. [name] has a distal esophageal  stricture that requires esophageal dilation.  She is scheduled to  receive propofol for the procedure.   CHRONIC MEDICAL PROBLEMS:  Severe Crohn's proctocolitis.  Subtotal  colectomy.  Severe chronic obstructive pulmonary disease.  Total  abdominal hysterectomy.  Gastroesophageal reflux.  __________  hypertension.  Elevated cholesterol.  Chronic anxiety.   ENDOSCOPIST:  Danise Edge, M.D.   PREMEDICATION:  Fentanyl 50 mcg, Versed 2 mg, propofol administered by  the anesthesiologist.   PROCEDURE:  After obtaining informed consent, Ms. Raelyn Mora-  Wolin was placed in the left lateral decubitus position on the  fluoroscopy table.  The patient received intravenous fentanyl, Versed,  and propofol to achieve conscious sedation for the procedure.  Patient's  blood pressure, oxygen saturation, and cardiac rhythm were monitored  throughout the procedure and documented in the medical record.   The Pentax gastroscope was passed through the posterior hypopharynx into  the proximal esophagus without difficulty.  The hypopharynx, larynx, and  vocal cords appeared normal.   ESOPHAGOSCOPY:  The proximal and mid segments of the esophagus appear  normal.  There is a benign peptic stricture at the esophagogastric  junction.  There is no endoscopic evidence for the presence of erosive  esophagitis or Barrett's  esophagus.   GASTROSCOPY:  Retroflexed view of the gastric cardia and fundus was  normal.  The gastric body, antrum, and pylorus appeared normal.   DUODENOSCOPY:  The duodenal bulb and descending duodenum appeared  normal.   SAVARY ESOPHAGEAL DILATION:  The Savary dilator wire was passed through  the gastroscope, and the tip of the guidewire advanced through the  distal gastric antrum.  It is confirmed endoscopically and  fluoroscopically.  Under fluoroscopic guidance, the 15 mm and 16 mm  Savary dilator is passed without resistance.  Repeat  esophagogastroscopy revealed dilation of the benign peptic stricture at  the esophagogastric junction and no gastric __________ to the guidewire.   ASSESSMENT:  Benign peptic stricture at the esophagogastric junction,  dilated with the 15 mm Savary dilator.           ______________________________  Danise Edge, M.D.     MJ/MEDQ  D:  06/25/2006  T:  06/25/2006  Job:  [id number]   cc:   Candyce Churn. Allyne Gee, M.D.  Fax: [phone]

## 2010-10-11 NOTE — Discharge Summary (Signed)
[name], [name]                       ACCOUNT NO.:  [id number]   MEDICAL RECORD NO.:  [id number]                   PATIENT TYPE:  IPS   LOCATION:  4005                                 FACILITY:  [facility]   PHYSICIAN:  [name], M.D.           DATE OF BIRTH:  17-Feb-1951   DATE OF ADMISSION:  08/11/2003  DATE OF DISCHARGE:  09/07/2003                                 DISCHARGE SUMMARY   DISCHARGE DIAGNOSES:  1. Deconditioning secondary to below.  2. 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  3. Severe chronic obstructive pulmonary disease status post ventilator     dependent respiratory failure.  4. History of hypertension.  5. History of Crohn's disease.  6. History of anxiety disorder.  7. History of gastroesophageal reflux disease.   HISTORY OF PRESENT ILLNESS:  The patient is a 60 year old black female,  right-handed, with past history of COPD and still is presently smoking,  admitted to [facility] on July 20, 2003, with respiratory  failure secondary to COPD exacerbation.  Placed on steroids, supportive care  and eventually requiring ventilator on July 22, 2003.  Hospital course  significant for HAP, Serratia tracheobronchitis, completed antibiotics,  agitation, and myopathy secondary to steroids.  The patient was extubated on  August 02, 2003, successfully after several failed attempts.  Now  deconditioned as well.  PT report at this time indicates the patient has bed  mobility total assist, transfer total assist, unable to ambulate.  The  patient was transferred to [facility] Department on August 11, 2003.  Primary care [name] is [employer]. [name], M.D.  Critical care and  pulmonologist is [name], M.D.   PAST MEDICAL HISTORY:  As above, plus Crohn's disease, anxiety disorder,  pancreatitis, elevated cholesterol, and hypertension.  Denies any diabetes  or CVAs.   PAST SURGICAL HISTORY:  Significant for hysterectomy.   PRIMARY CARE  PHYSICIAN:  [name], M.D.   SOCIAL HISTORY:  The patient lives with mother in one level home in  [place], [place], with four steps to entry.  Family can provide  some assistance.  She is married and lives with husband, mother and sister.  She smokes four to five cigarettes a day.  She is on disability.  No  children.   FAMILY HISTORY:  Noncontributory.   REVIEW OF SYMPTOMS:  Significant for reflux and wheezing.   HOSPITAL COURSE:  [name] was admitted to [facility]  Department on August 11, 2003, for comprehensive inpatient rehabilitation  where she received more than three hours of therapy daily.  Hospital course  significant for the following.   PROBLEM #1 -  DECONDITIONING/MYOPATHY SECONDARY TO STEROIDS:  At the time of  admission,the patient was unable to use upper extremities, unable to walk.  Throughout her therapy course, the patient made good progress.  At the time  of discharge she was eventually ambulating approximately 30 feet with  rolling walker  and was able to move her upper extremities.  The patient was  started on Lovenox 40 mg daily for deep venous thrombosis prophylaxis  without any bleeding noted.  The patient able to tolerate therapy very well.  Her main issues while in therapy were pulmonary issues and fatigue.  The  patient was able to tolerate therapies very well.  Endurance improved  gradually.  She was discharged in a modified independent level from  wheelchair.   PROBLEM #2 -  CHRONIC OBSTRUCTIVE PULMONARY DISEASE/PULMONARY:  The patient  was followed periodically by Dr. [name].  Occasionally the patient had  severe hypoxia. She was initially placed on chronic oxygen.  She was  initially only using oxygen at night.  The patient discharged home on  chronic oxygen.  The patient had chest x-ray performed on August 28, 2003, [place]  and lateral, due to hypoxia and coughing.  Chest x-ray revealed no  significant infiltrates.   Nevertheless, the patient was started on Avelox 40  mg p.o. q.4h. on August 28, 2003, x7 days.  The patient had another COPD  exacerbation possibly several days later.  Therefore, Dr. [name] started the  patient again on Cipro 750 mg p.o. b.i.d. on September 04, 2003, x7 days.  The  patient is discharged on prednisone 50 mg p.o. daily.  She is also to remain  on Advair one puff 550 b.i.d. as well as Spiriva 18 mcg Aerolizer inhaled  daily, and Guaifenesin 600 mg p.o. b.i.d.  At the time of discharge,  pulmonary function was stable.  The patient also had a sputum culture  performed prior to discharge but unable to collect an acceptable specimen at  this time.   PROBLEM #3 -  HISTORY OF HYPERTENSION:  Blood pressure remained in fairly  good control and actually the patient had low blood pressure throughout her  stay in rehab on clonidine patch 0.1 mg daily 24 hours x7 days.  This was  discontinued at the time of discharge.   PROBLEM #4 -  TACHYCARDIA:  Throughout her therapies the patient did have a  heart rate in the 110s.  This could be caused by the p.r.n. albuterol and  Atrovent treatment as well as the prednisone.  EKG was placed in chart on  September 01, 2003, which revealed sinus tachycardia.  This did improve  throughout her time in rehab.   PROBLEM #5 -  URINARY TRACT INFECTION:  On August 18, 2003, the patient was  started on Macrodantin 50 mg p.o. q.i.d. for seven days due to VRE and she  completed a dose of seven days of Macrodantin.   PROBLEM #6 -  INSOMNIA:  The patient remained on Desyrel 25 or 50 mg p.o.  q.h.s. or as needed for sleep.   PROBLEM #7 -  ANXIETY:  The patient was followed periodically by Dr. Leonides Cave.  She remained on Klonopin 0.5 mg p.o. q.h.s.   No other major issues occurred while the patient was in rehab.   LABORATORY DATA:  Latest hemoglobin was 11.3, hematocrit 34.7, platelet count 174, white blood cell count 12.2.  Sodium 141, potassium 3.5, chloride  104,  CO2 30, glucose 94, BUN 13, creatinine 0.5.  AST 18, ALT 95, alkaline  phosphatase 56.   PT report at the time of discharge indicates the patient is able to ambulate  approximately greater than 150 feet with rolling walker modified independent  level, able to transfer modified independent level, able to bed mobility  modified independent level, able  to perform most ADLs modified independent  level/supervision level.  Overall, the patient made excellent gains in  strength and control leading to independency.  The patient needs to continue  to work on strength.  From OT standpoint, the patient met all goals and  discharged at modified independent for most ADLs.   At the time of discharge, vitals were stable. Heart rate was 92, respiratory  rate 20, blood pressure 98/64, temperature 98.5.   The patient was discharged home with her mother.  Lungs were clear.   DISCHARGE MEDICATIONS:  1. Klonopin 0.5 mg at night.  2. Spiriva 18 mcg daily.  3. Advair one puff 550 Diskus twice daily.  4. Humibid LA 600 mg twice daily.  5. Prednisone 15 mg daily.  Dr. [name] to taper prednisone on September 22, 2003, at follow-up appointment.  6. Cipro 750 mg one tablet twice daily until September 11, 2003.  7. Desyrel 25 to 50 mg as needed.   PAIN MANAGEMENT:  Tylenol.   ACTIVITY:  No driving, no drinking alcohol, no smoking.   DIET:  No restrictions.   Use wheelchair, use oxygen. [facility] care for PT and OT.  Follow-  up with Dr. [name] as needed in two weeks.  Follow-up with Dr. Wynn Banker on  __________ 27, 2005, at 10 a.m.      Drucilla Schmidt, P.A.                         [name], M.D.    LB/MEDQ  D:  09/07/2003  T:  09/07/2003  Job:  [id number]   cc:   [name], M.D. [facility]   [name], M.D.  [address]  [address]  [place]  [place] [address]  Fax: [phone]

## 2010-10-11 NOTE — Discharge Summary (Signed)
[name], [name]                       ACCOUNT NO.:  [id number]   MEDICAL RECORD NO.:  [id number]                   PATIENT TYPE:  INP   LOCATION:  0359                                 FACILITY:  [facility]   PHYSICIAN:  [name]. [name], M.D. [facility]           DATE OF BIRTH:  14-Mar-1951   DATE OF ADMISSION:  07/20/2003  DATE OF DISCHARGE:  08/11/2003                                 DISCHARGE SUMMARY   FINAL DIAGNOSES:  1. Acute respiratory failure secondary to an exacerbation of chronic     obstructive pulmonary disease with resulting intubation and mechanical     ventilation from February 26 to August 02, 2003.  At the time of discharge     she is oxygenating on room air and comfortable at rest but short of     breath with anything more than moving from bed to chair.  2. History of Crohn's disease, not on any active Crohn's medications at the     time of admission.  3. Chronic anxiety disorder.  4. Stress-induced diabetes.  5. Right upper extremity swelling following peripherally inserted central     line placement with negative venous Dopplers August 03, 2003.  6. Serratia tracheal bronchitis documented by culture March 7 status post 10     days of Maxipime therapy completed on August 10, 2003.  7. Hypertension felt to be partly related to agitation, controlled with     Catapres patch this admission.   HISTORY:  This is a 60 year old black female active smoker followed by Dr.  [name] in the pulmonary clinic with COPD with a asthmatic component who had  actually been ventilated previously before presenting with a 2-day history  of increasing dyspnea, cough, and congestion with purulent sputum on  admission on July 20, 2003.  She was treated with aggressive  bronchodilators and IV steroids but deteriorated, required intubation with  mechanical ventilation.  She was difficult to ventilate initially with  marked air trapping and difficulty with agitation.  She required high doses  of  sedation therapy.  During the mechanical ventilation she spiked to 102  with Serratia identified on culture indicating hospital-acquired infection  though no definite pneumonia was identified.  She was treated with 10 days  of Maxipime and defervesced.  She was ultimately extubated using a sedate  to wean approach which limits the respiratory rate and prevents excessive  air trapping.  Following this illness, however, she was quite debilitated.  We have gradually reduced her sedation down and today on rounds she was all  smiles at rest on room air, comfortable, but short of breath and quite weak  going from bed to chair.  She was therefore felt to be a candidate for rehab  and will be referred now for this purpose.   Her condition at time of discharge therefore is that she has decreased  breath sounds but no wheezing, saturating well on room air, and not short  of  breath at rest but short of breath getting from the bed to the chair.  She  did have stress-induced diabetes but prior to discharge on March 18 she had  normal BMET fasting without any supplemental insulin, and CBC with a  hematocrit of 32.6% which was no change from baseline.   DISCHARGE MEDICATIONS:  1. Advair 500/50 one b.i.d.  2. Catapres-TTS patch one weekly.  3. Dulcolax suppository every-other day.  4. Klonopin 0.5 mg q.h.s.  5. Lovenox 40 mg subcu daily (can be discontinued once the patient is fully     ambulatory).  6. Prednisone 40 mg daily.  7. Protonix 40 mg b.i.d.  8. Seroquel 50 mg q.h.s.  9. Xopenex 1.25 mg q.6h.  10.      Spiriva 18 mcg capsules one daily.                                               [name]. [name], M.D. [facility]    MBW/MEDQ  D:  08/11/2003  T:  08/12/2003  Job:  [id number]   cc:   Candyce Churn. Allyne Gee, M.D.  [address]  [address]  [place]  [place] [address]  Fax: [phone]   Rehab

## 2010-10-11 NOTE — Op Note (Signed)
[name], [name]           ACCOUNT NO.:  [id number]   MEDICAL RECORD NO.:  [id number]          PATIENT TYPE:  INP   LOCATION:  6732                         FACILITY:  [facility]   PHYSICIAN:  Danise Edge, M.D.   DATE OF BIRTH:  1950/08/26   DATE OF PROCEDURE:  12/26/2005  DATE OF DISCHARGE:                                 OPERATIVE REPORT   PROCEDURE:  Flexible proctosigmoidoscopy.   HISTORY:  [name] is a 60 year old female born January 07, 1951.  [name] was diagnosed with Crohn's ileocolitis in 1983.  She has  had no surgery for her Crohn's disease the past.  On July 21, 2001,  proctocolonoscopy to the cecum revealed generalized proctitis and cecal  colitis.  In 2006, she underwent an esophagogastroduodenoscopy with small  bowel biopsy to rule out celiac sprue.  Esophagogastroduodenoscopy and small  bowel biopsies were normal.  On May 27, 2005, she underwent flexible  proctosigmoidoscopy in my office to evaluate painless hematochezia.  The  exam revealed distal proctitis only.  [name] was admitted to the [facility] November 27, 2005, to treat an exacerbation of her chronic Crohn's  colitis.  Proctocolonoscopy to the cecum revealed severe Crohn's colitis  with relative rectal sparing.  Despite IV high dose Solu-Medrol, mesalamine,  metronidazole, and one dose of Remicade, her colitis has not significantly  improved.  In fact, she has developed chronic dilation of her transverse  colon without endoscopic evidence for the presence of a stricture.  The  procedure is performed today to re-examine her colon and try to decompress  her transverse colon.   PAST MEDICAL HISTORY:  Crohn's ileocolitis diagnosed in 40. 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 Severe  chronic obstructive pulmonary disease secondary to cigarette smoking. Remote  total abdominal hysterectomy. Gastroesophageal reflux associated with a  normal esophagogastroduodenoscopy. Hypertension, hypercholesterolemia,  anxiety.   CHRONIC MEDICATIONS:  Advair 250/50 one inhalations twice daily, Accolate 20  mg b.i.d., aspirin 81 mg each morning, alprazolam each evening, Deplin 7.5  mg daily, generic Allegra 180 mg nightly, Fosamax oral solution 70 mg  weekly, furosemide 20 mg each morning, Lexapro 20 mg each morning,  lovastatin 20 mg each evening, Mucinex 1200 mg twice daily, nasal oxygen at  2 liters per minute, prednisone, Protonix 40 mg each morning, Premarin 0.3  mg each morning, Spiriva hand inhaler, one inhalation each evening,  multivitamin daily, Xopenex inhalation solution 1.2 mg per 3 mL by nebulizer  four times daily.   FAMILY HISTORY:  Negative for colon cancer.   PROCEDURE:  After obtaining informed consent, [name] was placed in the  left lateral decubitus position.  I administered intravenous fentanyl 100  mcg and intravenous Versed 7.5 mg. The patient's blood pressure, oxygen  saturation, and cardiac rhythm were monitored throughout the procedure and  documented in the medical record.   Anal inspection and digital rectal exam were normal, although digital rectal  exam caused an extreme amount of pain despite the patient being sedated.  The Olympus pediatric colonoscope was introduced into the rectum and safely  advanced to  approximately 50 cm from the anal verge.  Due to poor  visualization and extreme discomfort experienced by the patient, I was  unable to safely advance the endoscope more proximally.  As a result,  colonic decompression was not performed.  What I could visualize of the  colonic mucosa, again, reveals severely inflamed  mucosa with extreme friability and large pseudopolyps.  There is relative  rectal sparing.  There has been no significant change in the patient's  colonic picture despite a month long course of medical therapy.  She is  currently on TPN.  I will discuss with her transfer to  [facility] next week  if they can accept her to consider surgery.           ______________________________  Danise Edge, M.D.     MJ/MEDQ  D:  12/26/2005  T:  12/26/2005  Job:  [id number]

## 2010-10-11 NOTE — Op Note (Signed)
[name], [name] NO.:  [id number]   MEDICAL RECORD NO.:  [id number]                   PATIENT TYPE:  INP   LOCATION:  0365                                 FACILITY:  [facility]   PHYSICIAN:  Danise Edge, M.D.                DATE OF BIRTH:  1951/03/02   DATE OF PROCEDURE:  12/15/2003  DATE OF DISCHARGE:                                 OPERATIVE REPORT   HISTORY:  [name] was admitted to the hospital on December 12, 2003 to evaluate and treat probable recurrent Crohn's colitis.  [name]  was diagnosed with Crohn's ileocolitis in 1983.  On July 21, 2001, a  proctocolonoscopy to the cecum revealed generalized proctitis and cecal  colitis.  [name] has required no surgery for her Crohn's disease.   [name] required ventilation support when she developed a severe myopathy  due to high-dose prednisone.  She chronically is taking a low dose  prednisone to manage her severe chronic obstructive pulmonary disease.   [name] presented to the [facility] emergency room with  abdominal pain and bloody diarrhea.  CT scan of the abdomen and pelvis  revealed thickening of the right colon and transverse colon.  Her  urinalysis, CBC, and complete metabolic profile was normal.  There were no  signs of intestinal obstruction or abscess formation.   [name] has been in the hospital taking oral budesonide, oral Flagyl, and  Pentasa.  Her fever has resolved, but she continues to have severe abdominal  pain.  Colonoscopy is scheduled.  Her blood culture, urinalysis, stool  culture, and stool for C. difficile toxin have been negative.   ENDOSCOPIST:  Danise Edge, M.D.   PREMEDICATION:  Versed 10 mg, Demerol 100 mg.   PROCEDURE:  [name] was placed in the left lateral decubitus position.  I  administered intravenous Demerol and intravenous Versed to achieve conscious  sedation for the procedure.  The patient's blood pressure,  oxygen  saturation, and cardiac rhythm were monitored throughout the procedure and  documented in the medical record.   Anal inspection and digital rectal exam was normal.  The Olympus adjustable  pediatric colonoscope was introduced into the rectum and advanced to the  cecum.  I was unable to intubate the distal ileum through the ileocecal  valve.  Colonic preparation for the exam today was satisfactory.   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  The mucosa is friable,  and there are scattered, aphthous-appearing deep ulcers.  There is a cobble-  stoning appearance to the colonic mucosa.  These findings are consistent  with universal, generalized Crohn's proctocolitis.  Colonic biopsies were  performed.   ASSESSMENT:  Generalized universal proctocolitis, consistent with Crohn's  colitis.   RECOMMENDATIONS:  Oral budesonide is inadequate steroid therapy for [name]' universal generalized proctocolitis.  She needs intravenous Solu-  Medrol.  I will start with a conservative  dose of Solu-Medrol 40 mg  intravenously each morning along with her oral Flagyl and Pentasa.  I will  place a TB skin test, as [name] may require intravenous Remicade.  She  will need to be monitored for a recurrent steroid myopathy.                                               Danise Edge, M.D.    MJ/MEDQ  D:  12/15/2003  T:  12/15/2003  Job:  [id number]   cc:   Shan Levans, M.D. [facility]

## 2010-10-11 NOTE — Procedures (Signed)
[facility]  Patient:    [name] Visit Number: [id number] MRN: [id number]          Service Type: Attending:  Verlin Grills, M.D. Dictated by:   Verlin Grills, M.D. Proc. Date: 07/21/01   CC:         [name], M.D. [facility]  [name], M.D.  Lindell Spar. Chestine Spore, M.D.   Procedure Report  PROCEDURE:  Colonoscopy.  REFERRING PHYSICIAN:  Dr. Charlcie Cradle. Delford [name], Dr. Margaretmary [name], Dr. Edyth [name].  PROCEDURE INDICATION:  [name] is a 60 year old female born [birth year].  In November 1983, [name] was diagnosed with Crohns colitis by air contrast barium enema involving the transverse colon and right colon. Her small bowel follow-through x-ray series was normal.  In August 1988, her upper GI/small bowel follow-through x-ray series did reveal evidence of chronic Crohns disease in the terminal ileum and cecum. [name] has never required surgery for Crohns disease, which has been in remission for the past 10 years off all medication. Her Crohns therapy included folic acid, metronidazole, and Azulfidine.  [name] is experiencing a pressure-like discomfort in the left lower abdomen. She is having formed bowel movements associated with fresh blood spotting the toilet tissue and in the stool.  [name] has severe chronic obstructive pulmonary disease and is taking 5 mf of prednisone daily.  MEDICATION ALLERGIES:  SURGICAL GEL.  CHRONIC MEDICATIONS:  Advair, Accolate, Guaifenesin LA, prednisone, clonazepam, Protonix, bedtime oxygen 1 liter per minute, Proventil, oral potassium, temazepam, Premarin, Lasix, multivitamin, Zyban.  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  FAMILY HISTORY:  Negative for colon cancer.  ENDOSCOPIST:  Verlin Grills, M.D.  PREMEDICATION:  Demerol 75 mg, Versed 10 mg.  ENDOSCOPE:  Olympus Pediatric colonoscope.  DESCRIPTION OF PROCEDURE:  After obtaining informed consent, [name] was placed in the left lateral decubitus position. I administered intravenous Demerol and intravenous Versed to achieve conscious sedation for the procedure. The patients blood pressure, oxygen saturation, and cardiac rhythm were monitored throughout the procedure and documented in the medical record.  Anal inspection was completely normal. Digital rectal exam was normal. The Olympus pediatric video colonoscope was introduced into the rectum and advanced to the cecum. I was unable to intubate a normal appearing ileocecal valve and examine the distal ileum. Colonic preparation for the exam today was excellent.  Rectum:  There is generalized proctitis manifested by loss in the mucosal vascular pattern, mucosal friability; there are no discrete ulcers noted. Multiple biopsies were taken from the rectum.  Sigmoid colon and descending colon:  Normal.  Splenic flexure:  Normal.  Transverse colon:  Normal.  Hepatic flexure:  Normal.  Ascending colon:  Normal.  Cecum and ileocecal valve:  There is mucosal erythema in the very proximal cecum with mild mucosal friability.  ASSESSMENT:  Proctitis; otherwise normal proctocolonoscopy to the cecum. Rectal mucosa biopsies pending.  PLAN:  Place [name] 500 mg at bedtime. Dictated by:   Verlin Grills, M.D. Attending:  Verlin Grills, M.D. DD:  07/21/01 TD:  07/21/01 Job: [id number]

## 2010-10-11 NOTE — Discharge Summary (Signed)
[name], [name]                       ACCOUNT NO.:  [id number]   MEDICAL RECORD NO.:  [id number]                   PATIENT TYPE:  INP   LOCATION:  0365                                 FACILITY:  [facility]   PHYSICIAN:  Danise Edge, M.D.                DATE OF BIRTH:  08/28/1950   DATE OF ADMISSION:  12/12/2003  DATE OF DISCHARGE:  12/25/2003                                 DISCHARGE SUMMARY   DISCHARGE DIAGNOSIS:  Crohn's proctocolitis.   DISCHARGE MEDICATIONS:  1. [name] was instructed to remain on her usual medications, which     include Advair, Acolate, Nexium, Premarin, Spiriva, clonazepam, Proventil     and Nasonex.  2. For her Crohn's proctocolitis:     A. Prednisone 40 mg each morning for one week, 35 mg each morning for one        week, 30 mg each morning for one week, 25 mg each morning for one        week, 20 mg each morning for one week, 15 mg each morning for one        week, 10 mg each morning for one week and then resume 5 mg each        morning chronically.     B. Pentasa 1000 mg t.i.d.     C. Vicodin 5/500 mg one tablet q.6 h. p.r.n., No. 100.   OFFICE FOLLOW-UP:  I will evaluate [name] in my office in four weeks.   LABORATORY DATA:  C-difficile toxin negative on three occasions.  White  blood cell count 7,700, hemoglobin 10.9 grams, platelet count normal, MCV  normocytic.  Complete metabolic profile normal, except albumin 2.2.  Lipase  normal.  Urinalysis with urine culture normal.  Sputum culture negative.  Blood culture negative.  Chest x-ray revealed no acute cardiopulmonary  findings.  CT scan of the abdomen and pelvis suggested active colitis  involving the right colon and transverse colon.   HOSPITAL COURSE:  [name] is a 60 year old female born [birth year].   [name] was diagnosed with Crohn's ileocolitis in 1983.  She has never  required surgery for her Crohn's disease.  July 21, 2001  proctocolonoscopy to the  cecum revealed generalized proctitis and cecal  colitis; the remainder of her colon was normal.   [name] presented to the [facility] Emergency Room December 11, 2003 with abdominal pain and bloody diarrhea.  CT scan of the abdomen and  pelvis revealed thickening of the right colon and transverse colon.  Urinalysis, CBC and complete metabolic profile were normal.   Due to her continued diarrhea and abdominal discomfort, she re-presented to  the [facility] Emergency Room December 12, 2003.  Her acute abdominal  x-ray series and abdominal-pelvic CT scan showed no abscess for obstruction.   [name] was admitted to the hospital for management of her Crohn's  colitis.  [name] is allergic to SURGICAL GEL.   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  2. Crohn's ileocolitis diagnosed 1983.  3. Severe chronic obstructive pulmonary disease.  4. Total abdominal hysterectomy for cervical cancer in situ.  5. Gastroesophageal reflux disease.  6. Hypertension.  7. Elevated cholesterol.  8. Anxiety.   [name] underwent a proctocolonoscopy which revealed generalized,  universal proctocolitis consistent with Crohn's disease.   [name]' proctocolitis responded symptomatically to intravenous Solu-  Medrol 40 mg each morning plus Pentasa 1000 mg t.i.d.   She did receive a TB skin test, which was negative, in preparation for  possibly using Remicade, but Remicade was not required during this  hospitalization.   At discharge, [name] is in stable medical condition, tolerating a low-  __________ diet and having non-bloody, soft bowel movements without  significant abdominal pain.  Despite 40 mg of prednisone, she has not  demonstrated a steroid induced myopathy or problems with her lungs during  this hospitalization.                                               Danise Edge, M.D.    MJ/MEDQ  D:  12/25/2003  T:   12/25/2003  Job:  [id number]   cc:   Shan Levans, M.D. [facility]

## 2010-10-18 ENCOUNTER — Other Ambulatory Visit: Payer: Self-pay | Admitting: Critical Care Medicine

## 2010-10-27 ENCOUNTER — Other Ambulatory Visit: Payer: Self-pay | Admitting: Critical Care Medicine

## 2010-10-31 ENCOUNTER — Telehealth: Payer: Self-pay | Admitting: Critical Care Medicine

## 2010-10-31 NOTE — Telephone Encounter (Signed)
Per crystal, yes we have received her spiriva.  meds left up front for pt to pick up at her convenience - pt is aware.

## 2010-11-15 ENCOUNTER — Encounter: Payer: Self-pay | Admitting: Adult Health

## 2010-11-18 ENCOUNTER — Ambulatory Visit (INDEPENDENT_AMBULATORY_CARE_PROVIDER_SITE_OTHER): Payer: BLUE CROSS/BLUE SHIELD | Admitting: Adult Health

## 2010-11-18 ENCOUNTER — Encounter: Payer: Self-pay | Admitting: Adult Health

## 2010-11-18 DIAGNOSIS — J449 Chronic obstructive pulmonary disease, unspecified: Secondary | ICD-10-CM

## 2010-11-18 NOTE — Patient Instructions (Signed)
May decrease Prednisone 5mg   1/2 daily and hold at this dose.  Continue on current regimen follow up Dr. [name]  In 1 month or when you get back from your trip .  Call if you have any trouble while you are traveling

## 2010-11-18 NOTE — Progress Notes (Signed)
Subjective:    Patient ID: [name], female    DOB: [birth year], 60 y.o.   MRN: [id number]  HPI 60 yo woman with severe COPD Golds Stage IV   April 29, 2010 The pt is doing well since August, She visited cousin in [place]. She was able to go up and down flight of stairs. No real cough except phlegm in the am.  No chest pain. Dyspnea is at baseline.  She does note was in hosp for 3days in [facility] Oct 2011. No new issues.   June 13, 2010  Pt referred for surgical clearance. Pt needs R shoulder replacement. She is very limited by sever lung disease, stage IV copd oxygen dependent  >>not good surgical candidate.   09/09/2010 Post Hospital follow up  Pt presents for hospital follow up . Admitted 4/2-08/31/10 for AECOPD. CXR with no acute changes, CT sinus was neg. Tx with IV steroids and discharged on slow taper beginning at 60mg  to baseline maintence of 10mg  daily and empiric abx  Which she finished during her stay. She is feeling much better. Down to 20mg  daily of Prednisone. Wheezing and cough are much better.   10/02/10 Follow up  No changes   11/18/10 Follow up OV  Pt returns for 2 month follow up COPD - reports breathing is doing well since last ov.  no new complaints. Pt currently on 5 mg of prednisone . Doing well , requests decreased dose even further. No ER or Hospitalizations  Since last ov.  She is planning on several trips over next 2 months. She has her O2 set up for travel.  No increase cough or wheezing.    Review of Systems Constitutional:   No  weight loss, night sweats,  Fevers, chills, fatigue, or  lassitude.  HEENT:   No headaches,  Difficulty swallowing,  Tooth/dental problems, or  Sore throat,                No sneezing, itching, ear ache, nasal congestion, post nasal drip,   CV:  No chest pain,  Orthopnea, PND, swelling in lower extremities, anasarca, dizziness, palpitations, syncope.   GI  No heartburn, indigestion, abdominal pain, nausea, vomiting, diarrhea,  change in bowel habits, loss of appetite, bloody stools.   Resp:    No excess mucus, no productive cough,  No non-productive cough,  No coughing up of blood.  No change in color of mucus.  No wheezing.  No chest wall deformity  Skin: no rash or lesions.  GU: no dysuria, change in color of urine, no urgency or frequency.  No flank pain, no hematuria   MS:  No joint pain or swelling.  No decreased range of motion.  No back pain.  Psych:  No change in mood or affect. No depression or anxiety.  No memory loss.        Objective:   Physical Exam GEN: A/Ox3; pleasant , NAD  HEENT:  [place]/AT,  EACs-clear, TMs-wnl, NOSE-clear, THROAT-clear, no lesions, no postnasal drip or exudate noted.   NECK:  Supple w/ fair ROM; no JVD; normal carotid impulses w/o bruits; no thyromegaly or nodules palpated; no lymphadenopathy.  RESP  Coarse BS w/ diminshed BS in bases no accessory muscle use, no dullness to percussion  CARD:  RRR, no m/r/g  , no peripheral edema, pulses intact, no cyanosis or clubbing.  GI:   Soft & nt; nml bowel sounds; no organomegaly or masses detected.  Musco: Warm bil, no deformities or joint swelling  noted.   Neuro: alert, no focal deficits noted.    Skin: Warm, no lesions or rashes         Assessment & Plan:

## 2010-11-18 NOTE — Assessment & Plan Note (Signed)
Compensated on present regimen.  May decrease prednisone 5mg  1/2 daily  follow up in 1 month Dr. [name]   And As needed

## 2010-12-11 ENCOUNTER — Telehealth: Payer: Self-pay | Admitting: Critical Care Medicine

## 2010-12-11 NOTE — Telephone Encounter (Signed)
That is fine for the refills #3 for the 5mg  tabs.  If she can make ov when she returns  Call if any prob. As needed

## 2010-12-11 NOTE — Telephone Encounter (Signed)
Spoke with pt. She states needs refill on prednisone, only has a few more tablets left. She states that at last ov with TP, the plan was to decrease her dose from 5 mg daily to 2.5 mg daily. She states that due to hot/humid weather, she feared having flareup and so she has continued to take the 5 mg daily. Doing well with her breathing currently. She states that she never sched rov with PW b/c she will be in [place] for "a few more months"- TP, pls advise if okay for her to continue 5 mg. She is aware you and PW out of the office today and states that this can wait until 12/12/10. Thanks!

## 2010-12-12 MED ORDER — PREDNISONE 5 MG PO TABS: 5.0000 mg | ORAL_TABLET | Freq: Every day | ORAL | Status: DC

## 2010-12-12 NOTE — Telephone Encounter (Signed)
Refill sent. Pt aware.[name], CMA  

## 2011-02-05 ENCOUNTER — Telehealth: Payer: Self-pay | Admitting: Critical Care Medicine

## 2011-02-05 MED ORDER — ZAFIRLUKAST 10 MG PO TABS: 10.0000 mg | ORAL_TABLET | Freq: Two times a day (BID) | ORAL | Status: DC

## 2011-02-05 NOTE — Telephone Encounter (Signed)
I spoke with dorothy and she states they will need a new rx for accolate 10 mg faxed to [phone] attn: dorothy. This will need a fax cover sheet. Also need to add on rx that this needs to be shipped to pt home address. Rx has been given to Crystal to get PW to sign.

## 2011-02-05 NOTE — Telephone Encounter (Signed)
lmomtcb for dorothy

## 2011-02-05 NOTE — Telephone Encounter (Signed)
I spoke with pt and she states she want [place] to call the pt assistance program to see if she can get her accolate in 10 mg tablet so she does not have to cut the 20mg  tablets in half. I called [name] at [phone] and had to [facility]

## 2011-02-05 NOTE — Telephone Encounter (Signed)
Spoke with [name] and notified per our records she should be taking accolate 20 mg 1/2 tablet bid. [name] verbalized understanding and states nothing further needed.

## 2011-02-06 ENCOUNTER — Telehealth: Payer: Self-pay | Admitting: Critical Care Medicine

## 2011-02-06 NOTE — Telephone Encounter (Signed)
Rx signed by Dr. [name] and faxed back to [phone] attn: Nicole Cella.  I added the statement that this needs to be shipped to pt's home address on the rx per request.  Pt aware.

## 2011-02-06 NOTE — Telephone Encounter (Signed)
Reorder called into [facility]. Pt aware. Carron Curie, CMA

## 2011-02-07 ENCOUNTER — Encounter: Payer: Self-pay | Admitting: Critical Care Medicine

## 2011-02-13 ENCOUNTER — Telehealth: Payer: Self-pay | Admitting: *Deleted

## 2011-02-13 NOTE — Telephone Encounter (Signed)
Lm w/ a family member to call back to advise her spiriva is ready for pick up. I have placed the papers in [name]'s look at for her to fill out. Please advise [name] thanks  [employer], CMA

## 2011-02-18 NOTE — Telephone Encounter (Signed)
Forms signed by PW and faxed back to [phone].

## 2011-02-19 ENCOUNTER — Encounter: Payer: Self-pay | Admitting: Critical Care Medicine

## 2011-02-19 ENCOUNTER — Ambulatory Visit (INDEPENDENT_AMBULATORY_CARE_PROVIDER_SITE_OTHER): Payer: BLUE CROSS/BLUE SHIELD | Admitting: Critical Care Medicine

## 2011-02-19 DIAGNOSIS — J449 Chronic obstructive pulmonary disease, unspecified: Secondary | ICD-10-CM

## 2011-02-19 DIAGNOSIS — J383 Other diseases of vocal cords: Secondary | ICD-10-CM

## 2011-02-19 NOTE — Patient Instructions (Signed)
No change in medications. Return in   3 months 

## 2011-02-19 NOTE — Progress Notes (Signed)
Subjective:    Patient ID: [name], female    DOB: Aug 21, 1950, 60 y.o.   MRN: [id number]  HPI  60 y.o.  woman with severe COPD Golds Stage IV   02/19/2011 No ED visits or in hosp since 6/12.  Has had little mucus. Has been [place] for three months.  No real wheeze.   No real chest pain.  No qhs dyspnea.  No real heartburn Pt denies any significant sore throat, nasal congestion or excess secretions, fever, chills, sweats, unintended weight loss, pleurtic or exertional chest pain, orthopnea PND, or leg swelling Pt denies any increase in rescue therapy over baseline, denies waking up needing it or having any early am or nocturnal exacerbations of coughing/wheezing/or dyspnea. Pt also denies any obvious fluctuation in symptoms with  weather or environmental change or other alleviating or aggravating factors    Review of Systems  Constitutional:   No  weight loss, night sweats,  Fevers, chills, fatigue, or  lassitude.  HEENT:   No headaches,  Difficulty swallowing,  Tooth/dental problems, or  Sore throat,                No sneezing, itching, ear ache, nasal congestion, post nasal drip,   CV:  No chest pain,  Orthopnea, PND, swelling in lower extremities, anasarca, dizziness, palpitations, syncope.   GI  No heartburn, indigestion, abdominal pain, nausea, vomiting, diarrhea, change in bowel habits, loss of appetite, bloody stools.   Resp:    No excess mucus, no productive cough,  No non-productive cough,  No coughing up of blood.  No change in color of mucus.  No wheezing.  No chest wall deformity  Skin: no rash or lesions.  GU: no dysuria, change in color of urine, no urgency or frequency.  No flank pain, no hematuria   MS:  No joint pain or swelling.  No decreased range of motion.  No back pain.  Psych:  No change in mood or affect. No depression or anxiety.  No memory loss.        Objective:   Physical Exam  GEN: A/Ox3; pleasant , NAD  HEENT:  [place]/AT,  EACs-clear,  TMs-wnl, NOSE-clear, THROAT-clear, no lesions, no postnasal drip or exudate noted.   NECK:  Supple w/ fair ROM; no JVD; normal carotid impulses w/o bruits; no thyromegaly or nodules palpated; no lymphadenopathy.  RESP  Coarse BS w/ diminshed BS in bases no accessory muscle use, no dullness to percussion  CARD:  RRR, no m/r/g  , no peripheral edema, pulses intact, no cyanosis or clubbing.  GI:   Soft & nt; nml bowel sounds; no organomegaly or masses detected.  Musco: Warm bil, no deformities or joint swelling noted.   Neuro: alert, no focal deficits noted.    Skin: Warm, no lesions or rashes         Assessment & Plan:   BRONCHITIS, OBSTRUCTIVE CHRONIC W/O EXACRB Stable chronic obstructive lung disease with vocal cord dysfunction syndrome oxygen dependent Plan Maintain current inhaled medications as prescribed Maintain oxygen therapy Note patient has already received influenza vaccine    Updated Medication List Outpatient Encounter Prescriptions as of 02/19/2011  Medication Sig Dispense Refill  . albuterol (PROVENTIL HFA) 108 (90 BASE) MCG/ACT inhaler Inhale 2 puffs into the lungs every 6 (six) hours as needed.        . B-D ULTRAFINE III SHORT PEN 31G X 8 MM MISC       . bisoprolol (ZEBETA) 5 MG tablet Take 5  mg by mouth daily.        . citalopram (CELEXA) 20 MG tablet Take 20 mg by mouth daily.        [name] diltiazem (CARDIZEM CD) 180 MG 24 hr capsule Take 180 mg by mouth 3 (three) times daily.       . furosemide (LASIX) 20 MG tablet Take 1-2 tablets by mouth Once daily as needed.      [name] guaiFENesin (MUCINEX) 600 MG 12 hr tablet 1 tablet in the am, 2 tablet at lunch, 1 tablet in the evening.      [name] HYDROcodone-acetaminophen (VICODIN) 5-500 MG per tablet Take 1 tablet by mouth every 6 (six) hours as needed.        . Liraglutide (VICTOZA) 18 MG/3ML SOLN 1.8 units once daily      . Multiple Minerals-Vitamins (CITRACAL PLUS) TABS Take 1 tablet by mouth 2 (two) times daily.       .  Multiple Vitamin (MULTIVITAMIN) capsule Take 1 capsule by mouth daily.        [name] omeprazole (PRILOSEC) 40 MG capsule Take 40 mg by mouth 2 (two) times daily.       . predniSONE (DELTASONE) 5 MG tablet Take 1 tablet (5 mg total) by mouth daily.  30 tablet  3  . rosuvastatin (CRESTOR) 10 MG tablet Take 10 mg by mouth daily.        [name] tiotropium (SPIRIVA) 18 MCG inhalation capsule Place 18 mcg into inhaler and inhale daily.        . zafirlukast (ACCOLATE) 10 MG tablet Take 1 tablet (10 mg total) by mouth 2 (two) times daily.  60 tablet  5  . zoledronic acid (RECLAST) 5 MG/100ML SOLN Inject 5 mg into the vein once.        . Cholecalciferol (VITAMIN D) 1000 UNITS capsule Take 1,000 Units by mouth daily.        [name] DISCONTD: Ascorbic Acid (VITAMIN C) 1000 MG tablet Take 1,000 mg by mouth daily.        [name] DISCONTD: furosemide (LASIX) 20 MG tablet Take 40 mg by mouth daily.       [name] DISCONTD: Magnesium 250 MG TABS Take 1 tablet by mouth daily.        [name] DISCONTD: vitamin E 400 UNIT capsule Take 400 Units by mouth daily.        [name] DISCONTD: zolpidem (AMBIEN CR) 6.25 MG CR tablet Take 2 at bedtime

## 2011-02-19 NOTE — Assessment & Plan Note (Signed)
Stable chronic obstructive lung disease with vocal cord dysfunction syndrome oxygen dependent Plan Maintain current inhaled medications as prescribed Maintain oxygen therapy Note patient has already received influenza vaccine

## 2011-02-20 ENCOUNTER — Ambulatory Visit (INDEPENDENT_AMBULATORY_CARE_PROVIDER_SITE_OTHER): Payer: BLUE CROSS/BLUE SHIELD | Admitting: Obstetrics and Gynecology

## 2011-02-20 ENCOUNTER — Encounter: Payer: Self-pay | Admitting: Obstetrics and Gynecology

## 2011-02-20 ENCOUNTER — Other Ambulatory Visit (HOSPITAL_COMMUNITY)
Admission: RE | Admit: 2011-02-20 | Discharge: 2011-02-20 | Disposition: A | Discharge status: Home / Self Care | Payer: Medicare Other | Source: Ambulatory Visit | Attending: Obstetrics and Gynecology | Admitting: Obstetrics and Gynecology

## 2011-02-20 VITALS — BP 112/70 | Ht 62.0 in | Wt 133.0 lb

## 2011-02-20 DIAGNOSIS — N952 Postmenopausal atrophic vaginitis: Secondary | ICD-10-CM

## 2011-02-20 DIAGNOSIS — B9689 Other specified bacterial agents as the cause of diseases classified elsewhere: Secondary | ICD-10-CM

## 2011-02-20 DIAGNOSIS — M810 Age-related osteoporosis without current pathological fracture: Secondary | ICD-10-CM

## 2011-02-20 DIAGNOSIS — N950 Postmenopausal bleeding: Secondary | ICD-10-CM

## 2011-02-20 DIAGNOSIS — Z124 Encounter for screening for malignant neoplasm of cervix: Secondary | ICD-10-CM

## 2011-02-20 DIAGNOSIS — N898 Other specified noninflammatory disorders of vagina: Secondary | ICD-10-CM

## 2011-02-20 DIAGNOSIS — B373 Candidiasis of vulva and vagina: Secondary | ICD-10-CM

## 2011-02-20 DIAGNOSIS — N760 Acute vaginitis: Secondary | ICD-10-CM

## 2011-02-20 LAB — COMPREHENSIVE METABOLIC PANEL
AST: 28
Albumin: 2.8 — ABNORMAL LOW
BUN: 1 — ABNORMAL LOW
Calcium: 8.4
Chloride: 108
Creatinine, Ser: 0.65
GFR calc Af Amer: >60
GFR calc non Af Amer: >60
Total Bilirubin: 0.4

## 2011-02-20 LAB — URINALYSIS, ROUTINE W REFLEX MICROSCOPIC
Hgb urine dipstick: NEGATIVE
Ketones, ur: NEGATIVE
Nitrite: NEGATIVE
Nitrite: NEGATIVE
Nitrite: NEGATIVE
Protein, ur: NEGATIVE
Protein, ur: NEGATIVE
Specific Gravity, Urine: 1.014
Specific Gravity, Urine: 1.028
Urobilinogen, UA: 0.2
Urobilinogen, UA: 0.2
Urobilinogen, UA: 0.2
pH: 6

## 2011-02-20 LAB — DIFFERENTIAL
Basophils Absolute: 0
Basophils Relative: 0
Eosinophils Absolute: 0.1
Lymphocytes Relative: 14
Lymphs Abs: 1.5
Monocytes Relative: 8
Neutro Abs: 7.5
Neutrophils Relative %: 80 — ABNORMAL HIGH
Neutrophils Relative %: 73

## 2011-02-20 LAB — POCT I-STAT, CHEM 8
Calcium, Ion: 1.21
Chloride: 104
Glucose, Bld: 137 — ABNORMAL HIGH
HCT: 38
HCT: 41
Hemoglobin: 12.9
Hemoglobin: 13.9
Potassium: 4.3
Sodium: 137
TCO2: 28
TCO2: 26

## 2011-02-20 LAB — CBC
HCT: 31.8 — ABNORMAL LOW
HCT: 32.1 — ABNORMAL LOW
Hemoglobin: 10.4 — ABNORMAL LOW
MCHC: 32.1
MCHC: 32.5
MCHC: 32.7
MCV: 89.7
MCV: 89.8
Platelets: 275
Platelets: 289
Platelets: 291
RBC: 3.58 — ABNORMAL LOW
RBC: 4.05
WBC: 10.6 — ABNORMAL HIGH
WBC: 10.2
WBC: 11 — ABNORMAL HIGH
WBC: 12.3 — ABNORMAL HIGH

## 2011-02-20 LAB — HEPATIC FUNCTION PANEL
AST: 18
Albumin: 2.7 — ABNORMAL LOW
Total Bilirubin: 0.4
Total Protein: 6.4

## 2011-02-20 LAB — BASIC METABOLIC PANEL
CO2: 28
Chloride: 105
GFR calc Af Amer: >60
Potassium: 3.7
Sodium: 139

## 2011-02-20 LAB — LIPASE, BLOOD: Lipase: 18

## 2011-02-20 LAB — URINE CULTURE

## 2011-02-20 NOTE — Progress Notes (Signed)
Patient came back to see me today for further followup. For the last 3 months she's been complaining of an intermittent clear discharge vaginally occasionally mixed with a little bit of red blood. She is status post LAVH, left S&O but she does have her right ovary. She's also noted associated with the above some lower abdominal cramping. She sure her pulmonologist yesterday and she did not have a UTI. She is up-to-date on mammograms. She has had osteoporosis some of which I believe is associated with her constant use of steroids. We originally treated her with Fosamax but it aggravated her GERD. We then switched to IV Boniva in 2007 and now has had 4 years of IV Reclast. Her last bone density was in 2010 at our office in December and still showed osteoporosis. She does have vaginal dryness but is fine with that. She is okay without hormone replacement as well.  ROS: 12 point review of systems done. No change from previous review of systems in her chart. 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 Neck: No masses. Supraclavicular lymph nodes: Not enlarged. Breasts: Examined in both sitting and lying position. Symmetrical without skin changes or masses. Abdomen: Soft no masses guarding or rebound. No hernias. Pelvic: External within normal limits. BUS within normal limits. Vaginal examination shows poor estrogen effect, no cystocele enterocele or rectocele. Cervix and uterus absent. Adnexa within normal limits. Rectovaginal confirmatory. Extremities within normal limits. Wet prep positive for yeast, amine and clue cells.  Assessment: 1. Bacterial vaginosis 2. Yeast vaginitis 3. Postmenopausal bleeding 4. Atrophic vaginitis 5. Osteoporosis  Plan: Patient treated with MetroGel vaginal cream 1 applicator full at bedtime in vagina for 5 days followed by terconazole 3 cream for 3 days. Since she has been on long-term  drug therapy for her osteoporosis( see above) we will not give her Reclast to we do her bone density in December. A decision will be made then. Patient will return for ultrasound if cramping or vaginal bleeding persists after vaginitis treatment.

## 2011-02-20 NOTE — Patient Instructions (Signed)
Call my office in mid December, 2012 to schedule bone density in my office. If bleeding persists after medication call for ultrasound.

## 2011-03-04 LAB — I-STAT 8, (EC8 V) (CONVERTED LAB)
Acid-Base Excess: 2
BUN: 12
Chloride: 107
HCT: 39
Hemoglobin: 13.3
Operator id: 151321
Potassium: 3.7
Sodium: 142
pCO2, Ven: 55.8 — ABNORMAL HIGH

## 2011-03-04 LAB — CBC
Hemoglobin: 11.1 — ABNORMAL LOW
RBC: 4.13
WBC: 10.5

## 2011-03-04 LAB — DIFFERENTIAL
Lymphs Abs: 1.7
Monocytes Absolute: 1
Monocytes Relative: 10
Neutro Abs: 7.4
Neutrophils Relative %: 71

## 2011-03-04 LAB — POCT I-STAT CREATININE: Creatinine, Ser: 1

## 2011-04-14 ENCOUNTER — Telehealth: Payer: Self-pay | Admitting: *Deleted

## 2011-04-14 NOTE — Telephone Encounter (Signed)
90 day supply of Spiriva arrived via mail from [employer] [facility].  [place] and spoke with pt.  Pt aware meds at front desk for pt to pick up.

## 2011-04-29 ENCOUNTER — Ambulatory Visit (INDEPENDENT_AMBULATORY_CARE_PROVIDER_SITE_OTHER): Payer: BLUE CROSS/BLUE SHIELD | Admitting: Critical Care Medicine

## 2011-04-29 ENCOUNTER — Encounter: Payer: Self-pay | Admitting: Critical Care Medicine

## 2011-04-29 DIAGNOSIS — J449 Chronic obstructive pulmonary disease, unspecified: Secondary | ICD-10-CM

## 2011-04-29 NOTE — Assessment & Plan Note (Signed)
Severe chronic obstructive lung disease still oxygen dependent but improved Plan Continued inhaled medications as prescribed Patient advised to be more consistent with oxygen use

## 2011-04-29 NOTE — Patient Instructions (Signed)
No change in medications. Return in   3 months 

## 2011-04-29 NOTE — Progress Notes (Signed)
Subjective:    Patient ID: [name], female    DOB: 07-Jun-1950, 60 y.o.   MRN: [id number]  HPI  60 y.o.  woman with severe COPD Golds Stage IV    04/29/2011 Since last OV,  Not wearing oxygen.   No new issues Pt denies any significant sore throat, nasal congestion or excess secretions, fever, chills, sweats, unintended weight loss, pleurtic or exertional chest pain, orthopnea PND, or leg swelling Pt denies any increase in rescue therapy over baseline, denies waking up needing it or having any early am or nocturnal exacerbations of coughing/wheezing/or dyspnea. Pt also denies any obvious fluctuation in symptoms with  weather or environmental change or other alleviating or aggravating factors   Past Medical History  Diagnosis Date  . Other diseases of vocal cords   . Osteoporosis, unspecified   . Other and unspecified hyperlipidemia   . Esophageal reflux   . Chronic airway obstruction, not elsewhere classified   . Regional enteritis of unspecified site   . 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 Type II or unspecified type diabetes mellitus without mention of complication, not stated as uncontrolled   . Crohn's disease     Ileostomy  . Fibroid   . Pelvic adhesions   . Anxiety   . Depression      Family History  Problem Relation Age of Onset  . Osteoporosis Mother   . Hypertension Sister   . Stroke Sister   . Diabetes Sister      History   Social History  . Marital Status: Divorced    Spouse Name: N/A    Number of Children: N/A  . Years of Education: N/A   Occupational History  . Not on file.   Social History Main Topics  . Smoking status: Former Smoker -- 1.0 packs/day for 32 years    Quit date: 05/27/2003  . Smokeless tobacco: Never Used  . Alcohol Use: Yes     occasional  . Drug Use: Not on file  . Sexually Active: Yes    Birth Control/ Protection: Surgical   Other Topics Concern  . Not on file   Social History Narrative  .  No narrative on file     Allergies  Allergen Reactions  . Esomeprazole Magnesium   . Peanut-Containing Drug Products   . Shellfish Allergy      Outpatient Prescriptions Prior to Visit  Medication Sig Dispense Refill  . albuterol (PROVENTIL HFA) 108 (90 BASE) MCG/ACT inhaler Inhale 2 puffs into the lungs every 6 (six) hours as needed.        . B-D ULTRAFINE III SHORT PEN 31G X 8 MM MISC       . bisoprolol (ZEBETA) 5 MG tablet Take 5 mg by mouth daily.        . Cholecalciferol (VITAMIN D) 1000 UNITS capsule Take 1,000 Units by mouth daily.        . citalopram (CELEXA) 20 MG tablet Take 20 mg by mouth daily.        [name] diltiazem (CARDIZEM CD) 180 MG 24 hr capsule Take 180 mg by mouth 3 (three) times daily.       . furosemide (LASIX) 20 MG tablet Take 1-2 tablets by mouth Once daily as needed.      [name] guaiFENesin (MUCINEX) 600 MG 12 hr tablet 1 tablet in the am, 2 tablet at lunch, 1 tablet in the evening.      [name] HYDROcodone-acetaminophen (VICODIN) 5-500 MG per tablet Take  1 tablet by mouth every 6 (six) hours as needed.        . Liraglutide (VICTOZA) 18 MG/3ML SOLN 1.8 units once daily      . Multiple Minerals-Vitamins (CITRACAL PLUS) TABS Take 1 tablet by mouth 2 (two) times daily.       . Multiple Vitamin (MULTIVITAMIN) capsule Take 1 capsule by mouth daily.        [name] omeprazole (PRILOSEC) 40 MG capsule Take 40 mg by mouth 2 (two) times daily.       . predniSONE (DELTASONE) 5 MG tablet Take 1 tablet (5 mg total) by mouth daily.  30 tablet  3  . rosuvastatin (CRESTOR) 10 MG tablet Take 10 mg by mouth daily.        [name] tiotropium (SPIRIVA) 18 MCG inhalation capsule Place 18 mcg into inhaler and inhale daily.        . zafirlukast (ACCOLATE) 10 MG tablet Take 1 tablet (10 mg total) by mouth 2 (two) times daily.  60 tablet  5  . zoledronic acid (RECLAST) 5 MG/100ML SOLN Inject 5 mg into the vein once.           Review of Systems  Constitutional:   No  weight loss, night sweats,  Fevers, chills,  fatigue, or  lassitude.  HEENT:   No headaches,  Difficulty swallowing,  Tooth/dental problems, or  Sore throat,                No sneezing, itching, ear ache, nasal congestion, post nasal drip,   CV:  No chest pain,  Orthopnea, PND, swelling in lower extremities, anasarca, dizziness, palpitations, syncope.   GI  No heartburn, indigestion, abdominal pain, nausea, vomiting, diarrhea, change in bowel habits, loss of appetite, bloody stools.   Resp:    No excess mucus, no productive cough,  No non-productive cough,  No coughing up of blood.  No change in color of mucus.  No wheezing.  No chest wall deformity  Skin: no rash or lesions.  GU: no dysuria, change in color of urine, no urgency or frequency.  No flank pain, no hematuria   MS:  No joint pain or swelling.  No decreased range of motion.  No back pain.  Psych:  No change in mood or affect. No depression or anxiety.  No memory loss.        Objective:   Physical Exam  Filed Vitals:   04/29/11 0915  BP: 102/72  Pulse: 93  Temp: 98.2 F (36.8 C)  TempSrc: Oral  Height: 5\' 2"  (1.575 m)  Weight: 125 lb (56.7 kg)  SpO2: 99%    Gen: Pleasant, well-nourished, in no distress,  normal affect  ENT: No lesions,  mouth clear,  oropharynx clear, no postnasal drip  Neck: No JVD, no TMG, no carotid bruits  Lungs: No use of accessory muscles, no dullness to percussion, distant BS  Cardiovascular: RRR, heart sounds normal, no murmur or gallops, no peripheral edema  Abdomen: soft and NT, no HSM,  BS normal  Musculoskeletal: No deformities, no cyanosis or clubbing  Neuro: alert, non focal  Skin: Warm, no lesions or rashes           Assessment & Plan:   COPD Severe chronic obstructive lung disease still oxygen dependent but improved Plan Continued inhaled medications as prescribed Patient advised to be more consistent with oxygen use     Updated Medication List Outpatient Encounter Prescriptions as of 04/29/2011   Medication Sig Dispense Refill  .  albuterol (PROVENTIL HFA) 108 (90 BASE) MCG/ACT inhaler Inhale 2 puffs into the lungs every 6 (six) hours as needed.        . B-D ULTRAFINE III SHORT PEN 31G X 8 MM MISC       . bisoprolol (ZEBETA) 5 MG tablet Take 5 mg by mouth daily.        . Cholecalciferol (VITAMIN D) 1000 UNITS capsule Take 1,000 Units by mouth daily.        . citalopram (CELEXA) 20 MG tablet Take 20 mg by mouth daily.        [name] diltiazem (CARDIZEM CD) 180 MG 24 hr capsule Take 180 mg by mouth 3 (three) times daily.       . furosemide (LASIX) 20 MG tablet Take 1-2 tablets by mouth Once daily as needed.      [name] guaiFENesin (MUCINEX) 600 MG 12 hr tablet 1 tablet in the am, 2 tablet at lunch, 1 tablet in the evening.      [name] HYDROcodone-acetaminophen (VICODIN) 5-500 MG per tablet Take 1 tablet by mouth every 6 (six) hours as needed.        . Liraglutide (VICTOZA) 18 MG/3ML SOLN 1.8 units once daily      . Multiple Minerals-Vitamins (CITRACAL PLUS) TABS Take 1 tablet by mouth 2 (two) times daily.       . Multiple Vitamin (MULTIVITAMIN) capsule Take 1 capsule by mouth daily.        [name] omeprazole (PRILOSEC) 40 MG capsule Take 40 mg by mouth 2 (two) times daily.       . predniSONE (DELTASONE) 5 MG tablet Take 1 tablet (5 mg total) by mouth daily.  30 tablet  3  . rosuvastatin (CRESTOR) 10 MG tablet Take 10 mg by mouth daily.        [name] tiotropium (SPIRIVA) 18 MCG inhalation capsule Place 18 mcg into inhaler and inhale daily.        . zafirlukast (ACCOLATE) 10 MG tablet Take 1 tablet (10 mg total) by mouth 2 (two) times daily.  60 tablet  5  . zoledronic acid (RECLAST) 5 MG/100ML SOLN Inject 5 mg into the vein once.

## 2011-05-06 ENCOUNTER — Other Ambulatory Visit: Payer: Self-pay | Admitting: Obstetrics and Gynecology

## 2011-05-13 ENCOUNTER — Ambulatory Visit: Payer: Medicare Other

## 2011-05-13 ENCOUNTER — Other Ambulatory Visit: Payer: Self-pay | Admitting: Adult Health

## 2011-05-24 ENCOUNTER — Telehealth: Payer: Self-pay | Admitting: Pulmonary Disease

## 2011-05-24 MED ORDER — LEVOFLOXACIN 750 MG PO TABS: 750.0000 mg | ORAL_TABLET | Freq: Every day | ORAL | Status: AC

## 2011-05-24 NOTE — Telephone Encounter (Signed)
Pt c/o a few days h/o cough with white mucus, but now is bringing up large quantity of green mucus starting this am.  Had temp to 100 last night.  No increased sob.  Will call in levaquin for presumed acute bronchitis.  Told pt to call us back or go to ER if worsening.

## 2011-05-30 IMAGING — CR DG CHEST 1V PORT
2 series · 2 of 2 positions shown · non-contrast
Comparison: Portable exam 2277 hours, repeated 7810 hours, compared
to 09/11/2008

CLINICAL DATA: Shortness of breath, wheezing

PORTABLE CHEST - 1 VIEW

[view not recorded (1 of 2)]
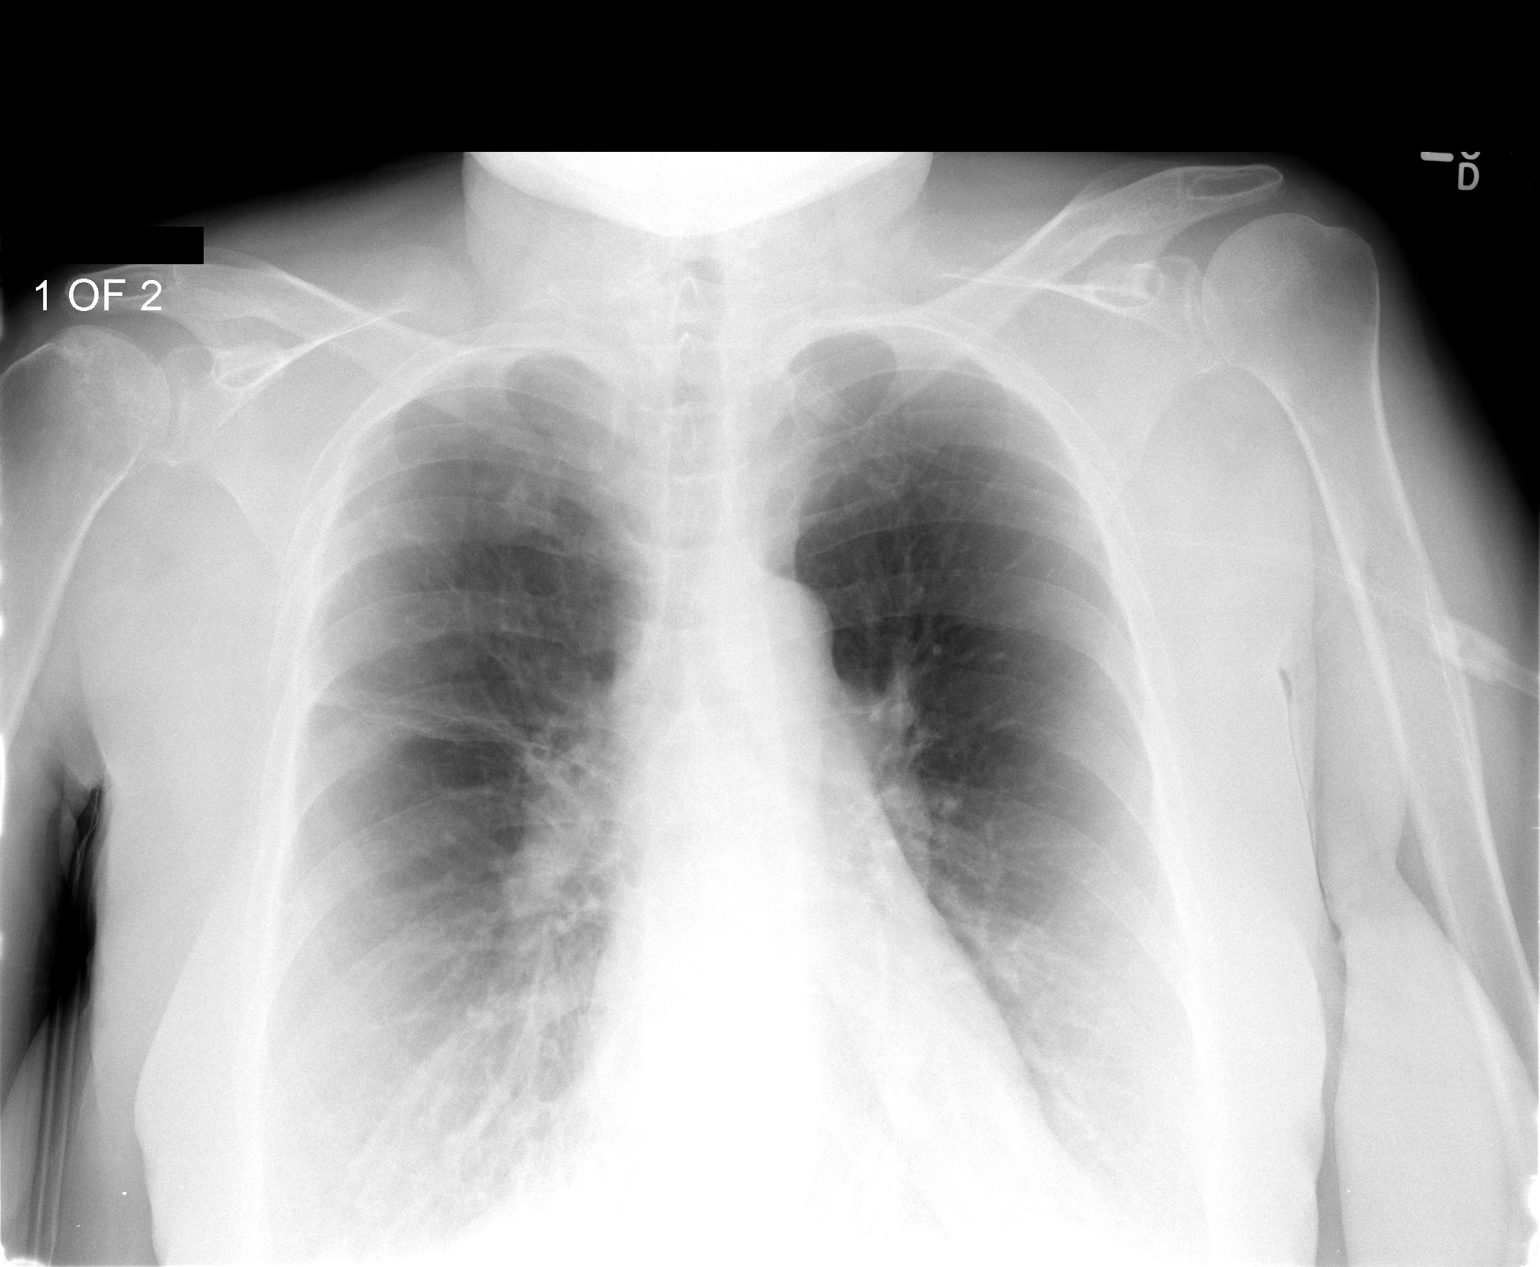

[view not recorded (2 of 2)]
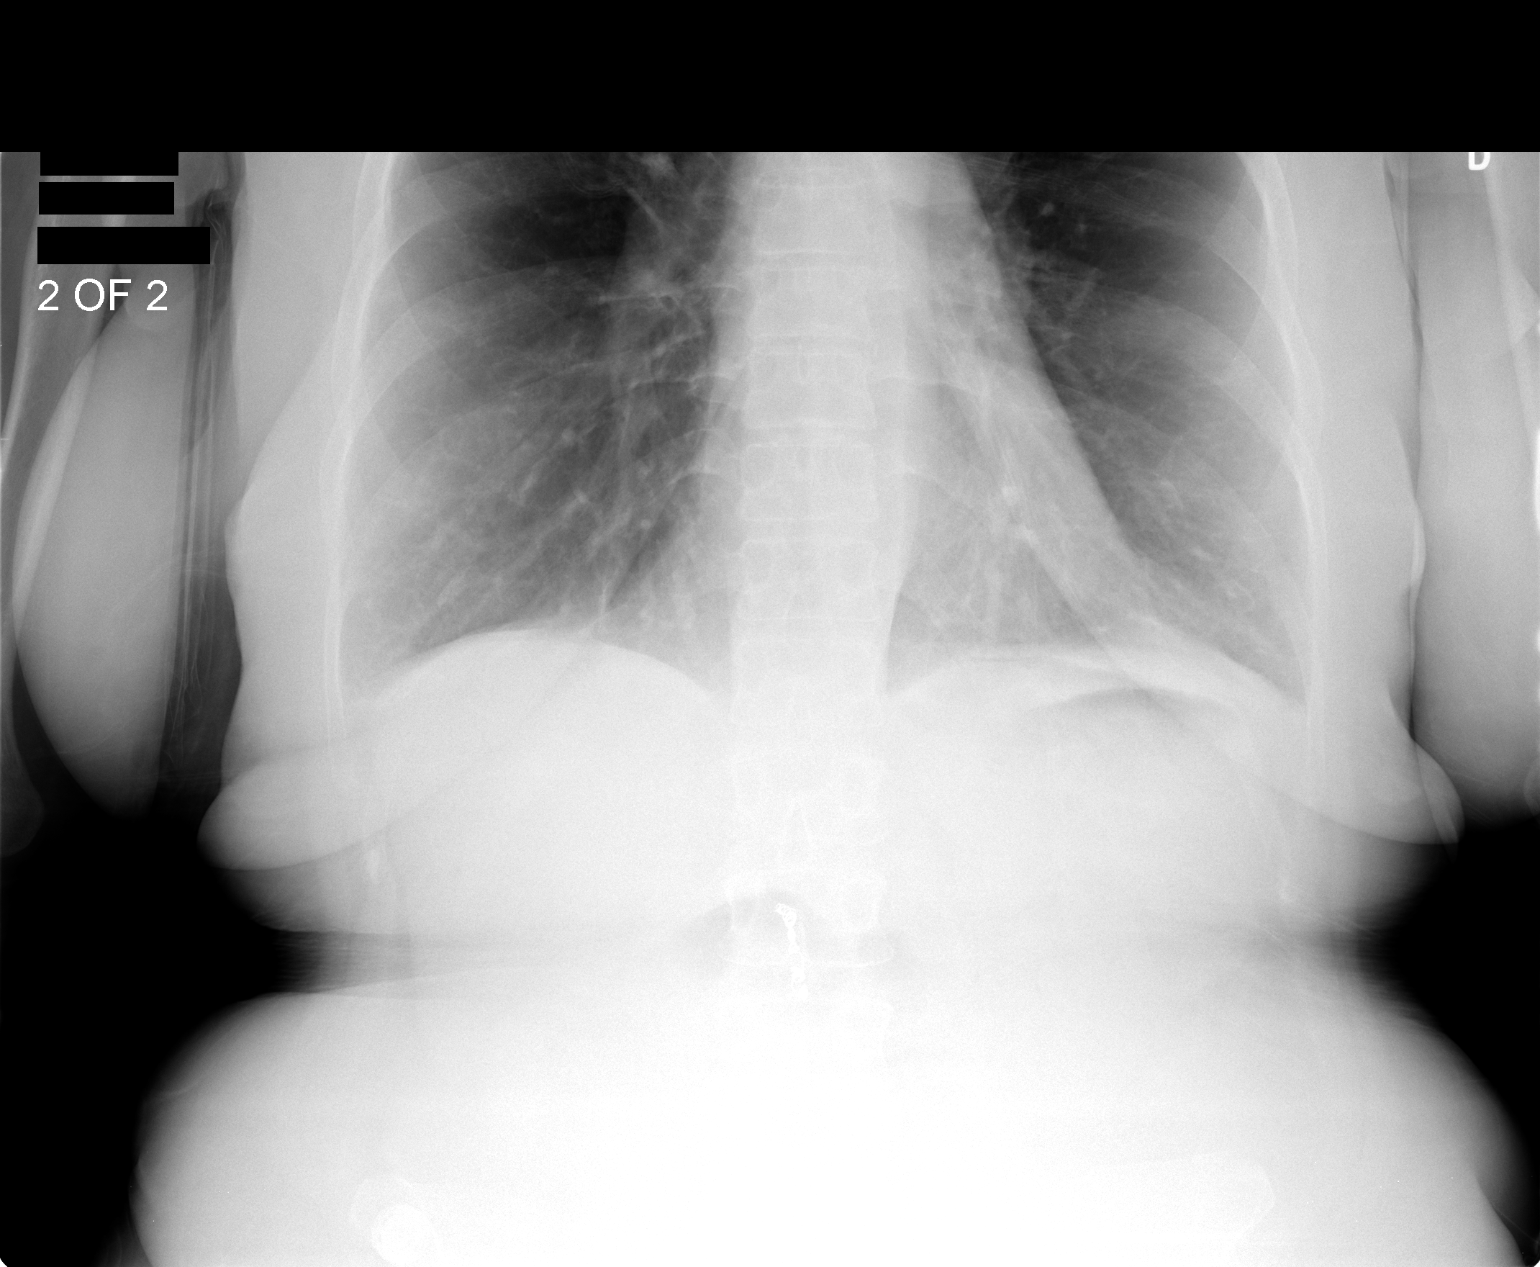

[2 of 2 positions shown; findings below may reference images not displayed]

FINDINGS: Normal heart size, mediastinal contours, and pulmonary vascularity.
Emphysematous and bronchitic changes compatible with COPD.
Chronic subsegmental atelectasis versus scarring right mid lung.
No acute infiltrate or pleural effusion.
Bones diffusely demineralized.
IMPRESSION: COPD with chronic subsegmental atelectasis versus scarring right
mid lung.
No acute abnormalities.

## 2011-05-31 IMAGING — CR DG ABDOMEN 1V
1 series · 1 of 1 positions shown · non-contrast
Comparison: 07/28/2008

CLINICAL DATA: Abdominal pain

ABDOMEN - 1 VIEW

[t abdomen supine]
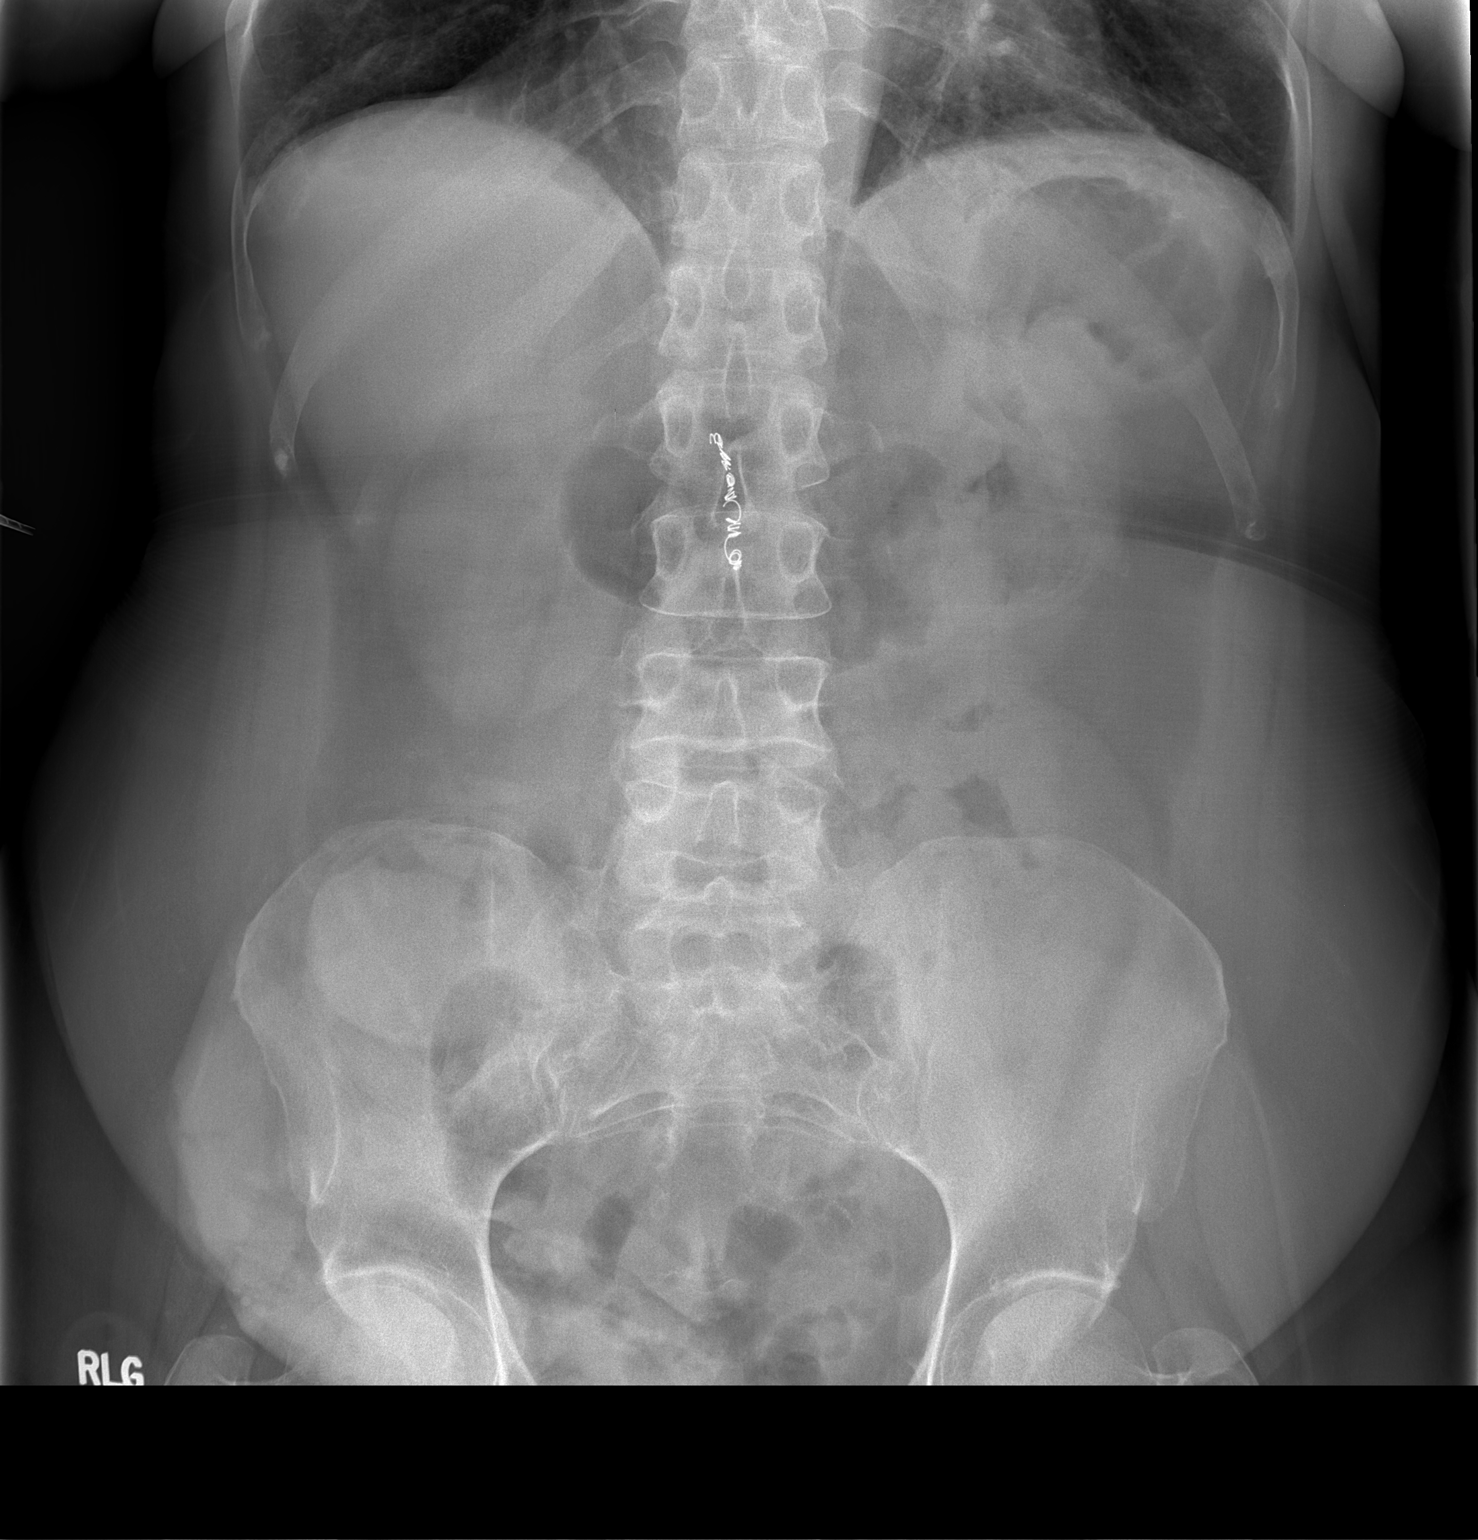

[1 of 1 positions shown; findings below may reference images not displayed]

FINDINGS: No acute or specific abnormality of the bowel gas
pattern.  An ostomy device projects over the right iliac crest.
Surgical sutures project over the midline at L1-L2 as before.

Psoas margins intact.  No pathological calcifications.
IMPRESSION: Postoperative changes - no acute or specific findings.

## 2011-06-01 IMAGING — CT CT PARANASAL SINUSES LIMITED
1 series · 16 of 28 positions shown, 20 images · non-contrast
Comparison: 06/22/2008

CLINICAL DATA: Cough.  Short of breath.  Asthma.

CT PARANASAL SINUSES WITHOUT CONTRAST
TECHNIQUE: Multidetector CT through the paranasal sinuses was
performed using the standard protocol without intravenous contrast.

[Series 3: ltd sinuses 3.0 h40s · axial · 0.34mm/px · z∈[+1130,+1256]mm · 16 of 28 slices shown, 20 images]
[im 2/28  brain]
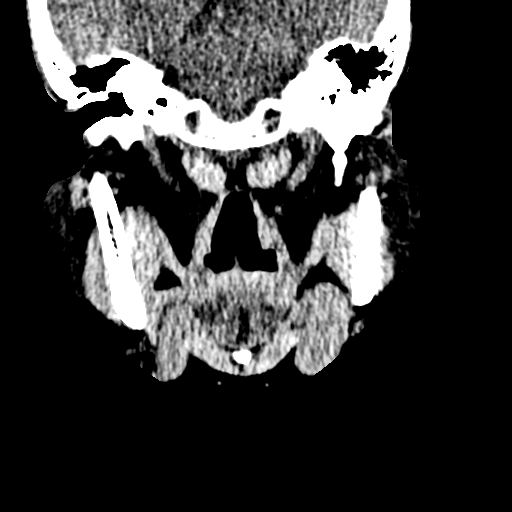
[im 2/28  bone]
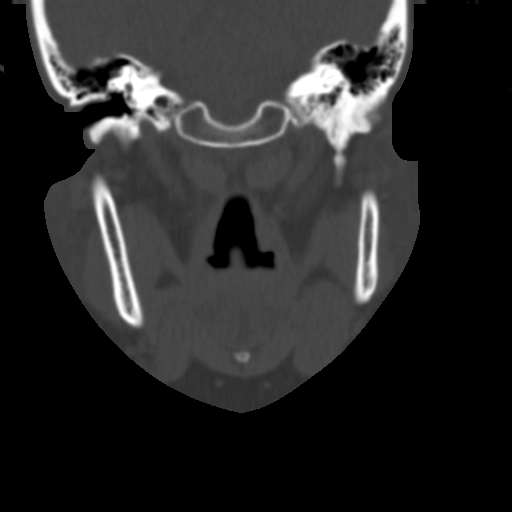
[im 4/28  bone]
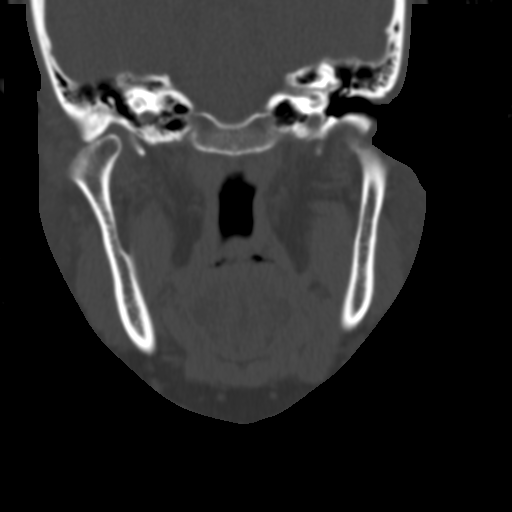
[im 6/28  bone]
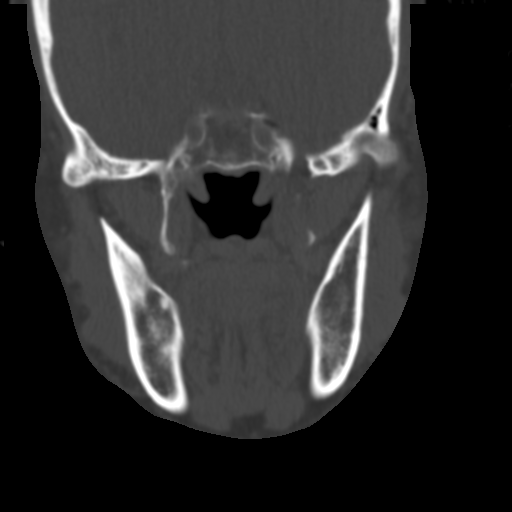
[im 7/28  bone]
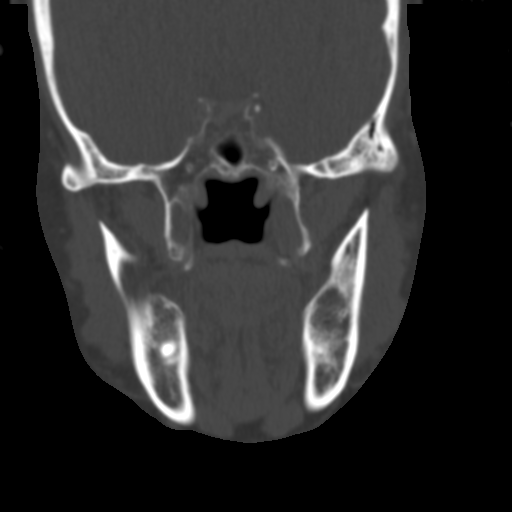
[im 9/28  brain]
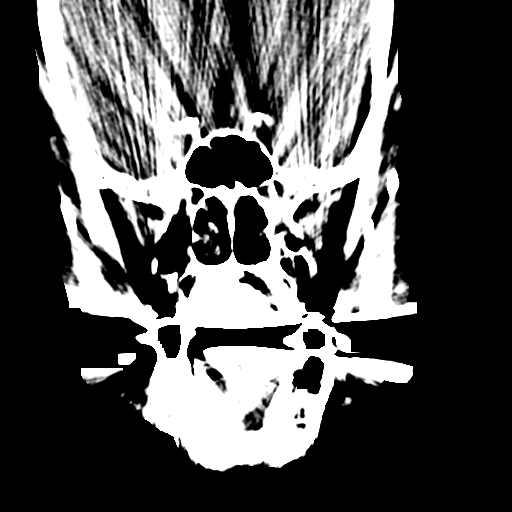
[im 9/28  bone]
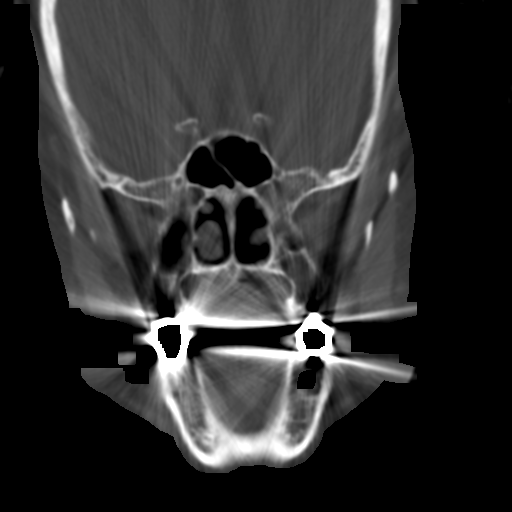
[im 10/28  bone]
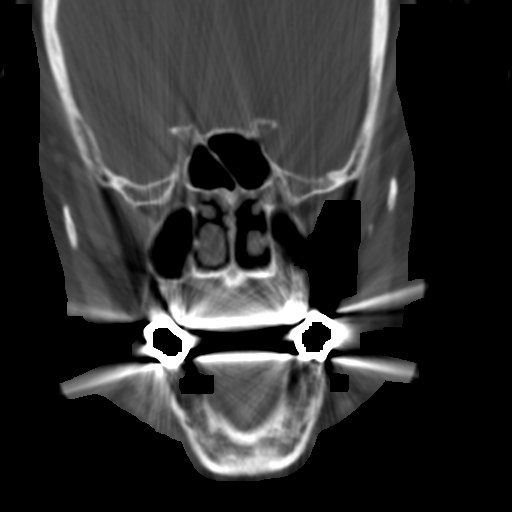
[im 12/28  bone]
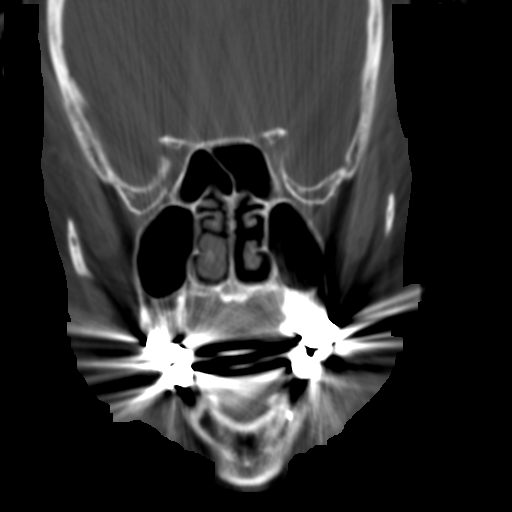
[im 14/28  bone]
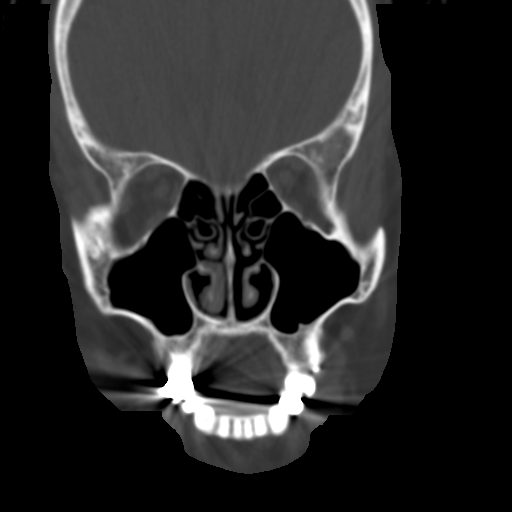
[im 15/28  brain]
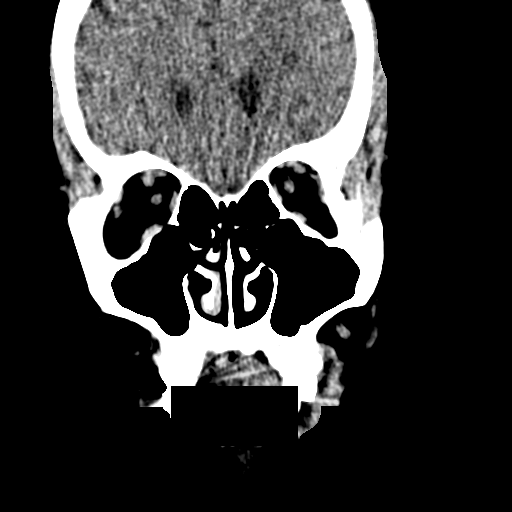
[im 15/28  bone]
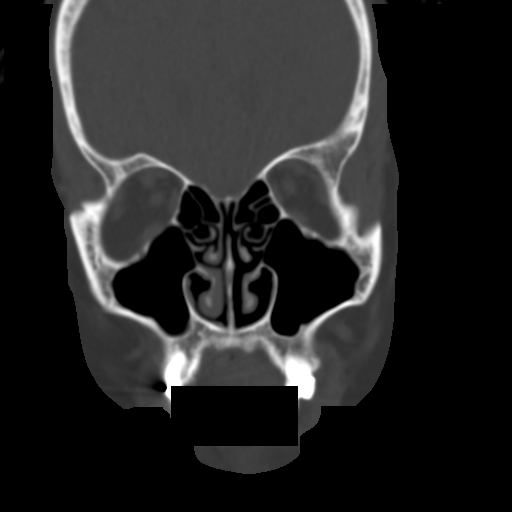
[im 17/28  bone]
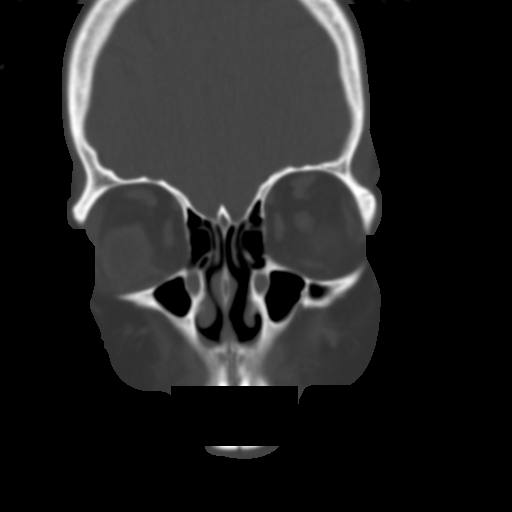
[im 19/28  bone]
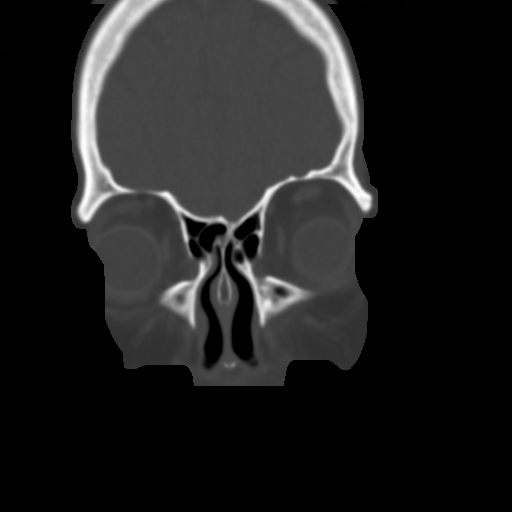
[im 20/28  bone]
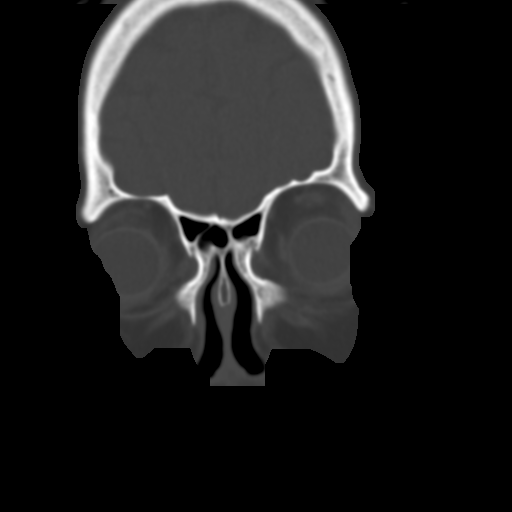
[im 22/28  brain]
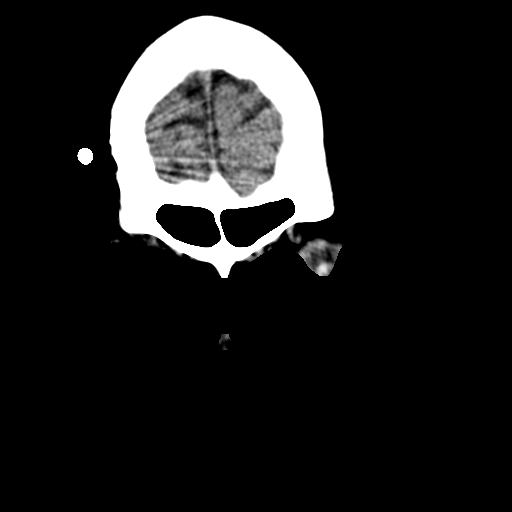
[im 22/28  bone]
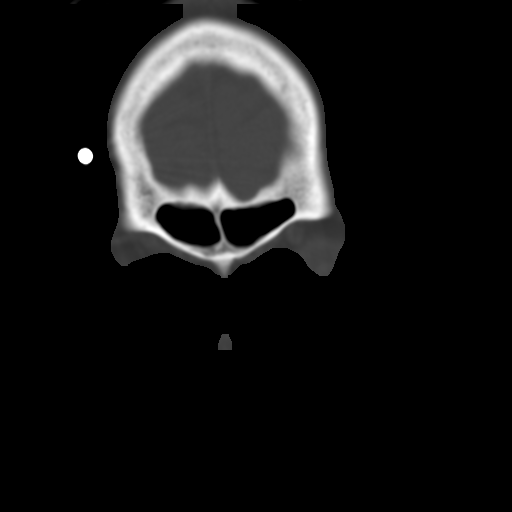
[im 23/28  bone]
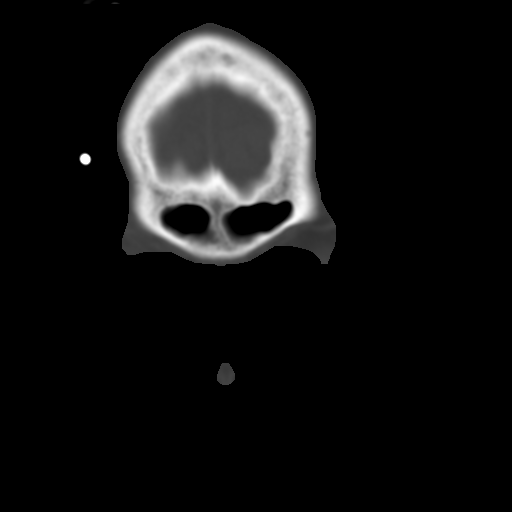
[im 25/28  bone]
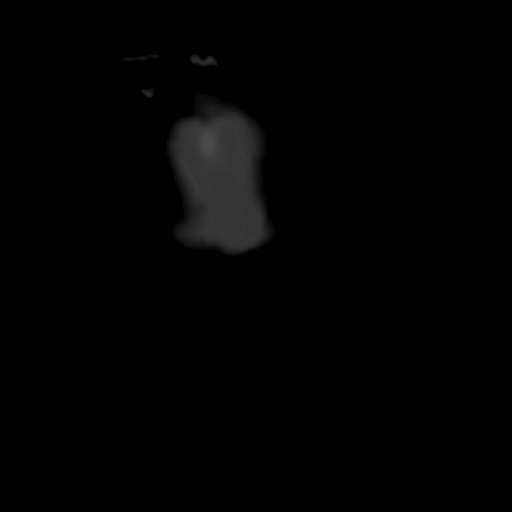
[im 27/28  bone]
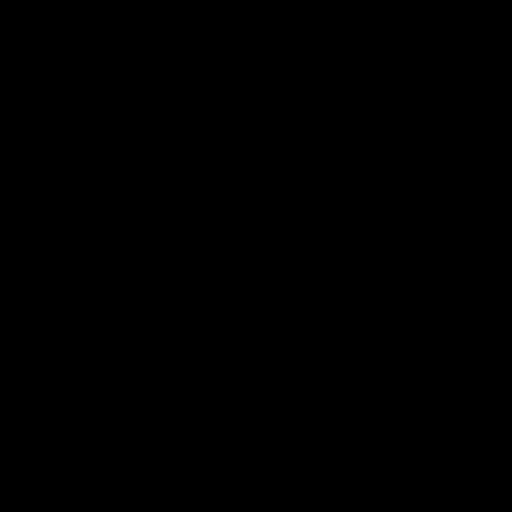

[16 of 28 positions shown; findings below may reference images not displayed]

FINDINGS: Frontal, ethmoid, maxillary and sphenoid sinuses are
clear.  No fluid or mucosal thickening.  No fluid seen in the
visualized portions of the mastoids were in the middle ears.
IMPRESSION: Negative for inflammatory sinus disease.

## 2011-06-03 IMAGING — CR DG UGI W/ SMALL BOWEL
2 series · 2 of 2 positions shown · IV contrast (agent unspecified)
Comparison: CT 07/14/2007

CLINICAL DATA: History of Crohn's disease ileostomy.  Concern for
ileitis.

UPPER GI W/ SMALL BOWEL HIGH DENSITY
TECHNIQUE: Upper GI series performed with high density barium and
effervescent agent. Thin barium also used.  Subsequently, serial
images of the small bowel were obtained including spot views of the
terminal ileum.
Fluoroscopy Time: 3.9 minutes
Contrast: Thick barium

[t abdomen supine *]
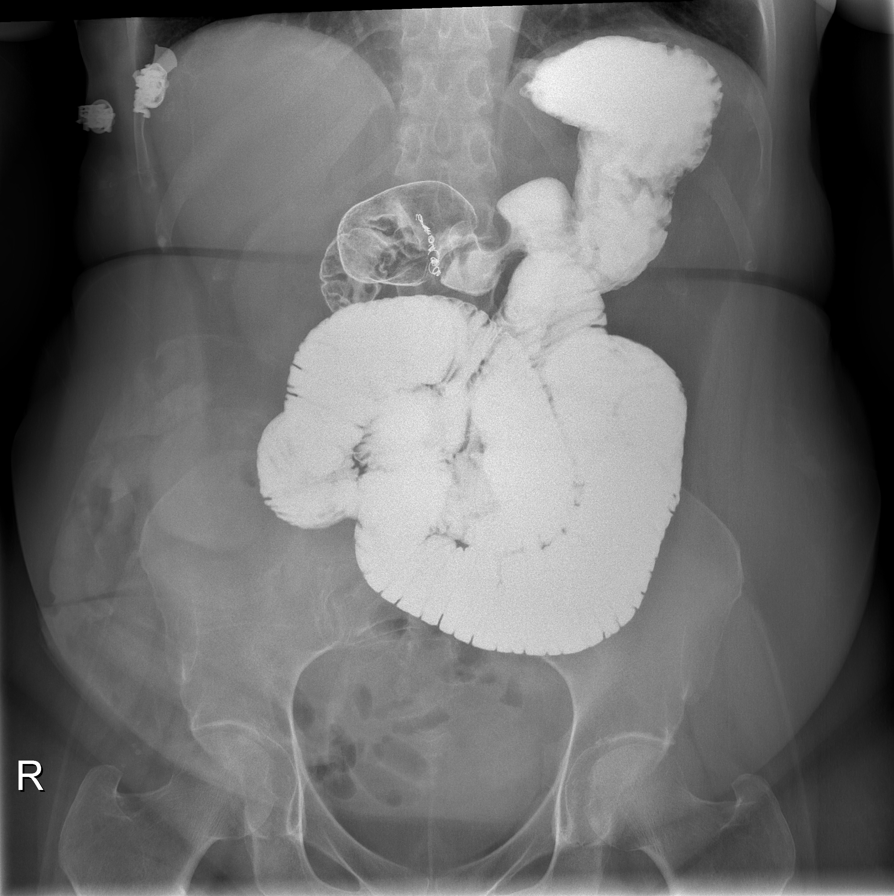

[t abdomen supine]
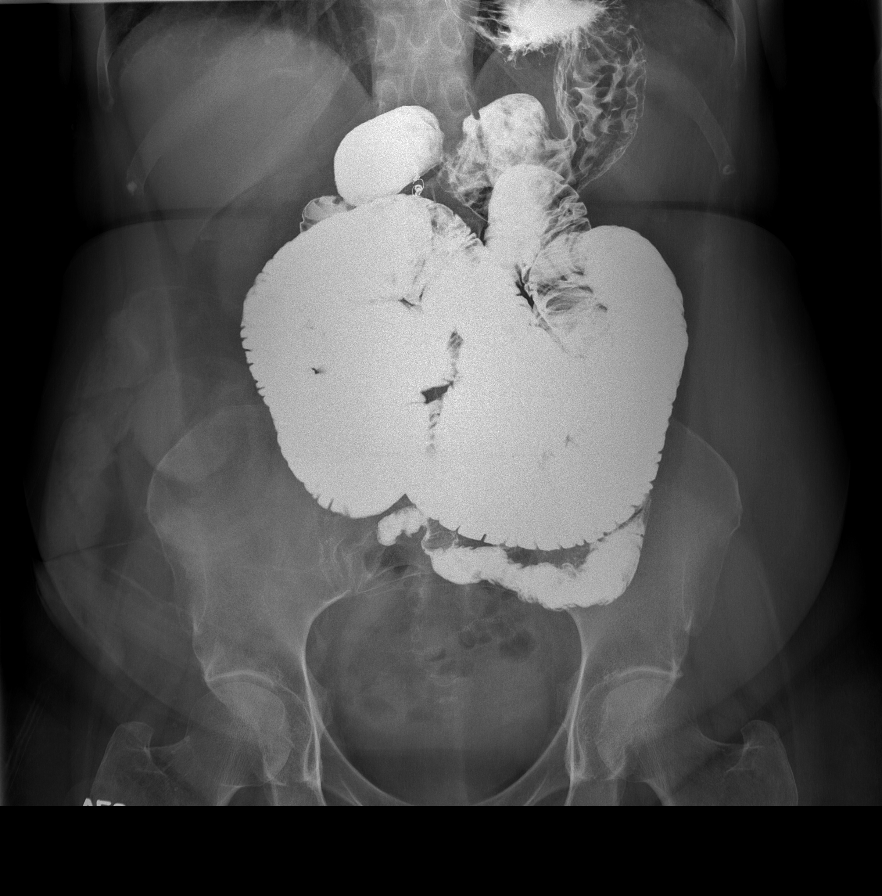

[2 of 2 positions shown; findings below may reference images not displayed]

FINDINGS: There is no esophageal mucosal irregularity, stricture,
or mass within the esophagus.

No mucosal irregularity or stricture or mass within the stomach or
duodenum.  There is retained foodstuff within the duodenal bulb.
This clears on the more delayed films.

The C-loop of the duodenum appears normal.  The proximal jejunum
and ileum appear normal.  No mucosal irregularity.  The distal
ileum leading up to the ostomy demonstrates no mucosal
irregularity.
IMPRESSION: 1.  No evidence of ileitis.
2.  No mucosal irregularity evident within the esophagus, stomach,
duodenum, or bowel.

Findings discussed with Dr. Rosario on the 10/13/2009

## 2011-07-09 ENCOUNTER — Other Ambulatory Visit: Payer: Self-pay | Admitting: Critical Care Medicine

## 2011-07-15 ENCOUNTER — Ambulatory Visit (INDEPENDENT_AMBULATORY_CARE_PROVIDER_SITE_OTHER): Payer: Medicare Other | Admitting: Obstetrics and Gynecology

## 2011-07-15 DIAGNOSIS — M810 Age-related osteoporosis without current pathological fracture: Secondary | ICD-10-CM

## 2011-07-15 NOTE — Progress Notes (Signed)
IS the patient comes see me today because of the worsening bone density. We have now treated her with IV Reclast for 4 years. When you compare her bone density from 2010 to 2012 she has had additional bone loss of both hips of 6.4% and 4.7%. She is faithfully gotten her IV Reclast. She takes calcium and vitamin D. She exercises regularly although it is somewhat difficult with her pulmonary disease. She is not a smoker or drinker. I think the problem is mostly due to her chronic need for both prednisone and spriva inhalent. She is very careful so she will not fall. She's had no fractures. We have very long discussion regarding the above. We will check both blood and urine to be sure she is not have secondary causes of bone loss. Her calcium level was normal this year. We discussed switching to Forteo. For the moment I. Would like to avoid that. She was very concerned about Forteo when  we discussed the  black box warning regarding Forteo and bone malignancies. Assuming her blood and urine is normal we'll do a short-term bone density followup one year after her last one. She will call to schedule at the appropriate time. We will continue her IV Reclast in August of 2013.

## 2011-07-16 LAB — COMPREHENSIVE METABOLIC PANEL
ALT: 21 U/L (ref 0–35)
Albumin: 3.4 g/dL — ABNORMAL LOW (ref 3.5–5.2)
BUN: 18 mg/dL (ref 6–23)
CO2: 27 mEq/L (ref 19–32)
Calcium: 9 mg/dL (ref 8.4–10.5)
Chloride: 107 mEq/L (ref 96–112)
Creat: 0.98 mg/dL (ref 0.50–1.10)
Potassium: 4.3 mEq/L (ref 3.5–5.3)

## 2011-07-16 LAB — VITAMIN D 25 HYDROXY (VIT D DEFICIENCY, FRACTURES): Vit D, 25-Hydroxy: 72 ng/mL (ref 30–89)

## 2011-07-16 LAB — PTH, INTACT AND CALCIUM: PTH: 34.1 pg/mL (ref 14.0–72.0)

## 2011-07-19 ENCOUNTER — Telehealth: Payer: Self-pay | Admitting: Pulmonary Disease

## 2011-07-19 NOTE — Telephone Encounter (Signed)
Pt has significant copd.  Gives 48hr history of very mild cough with scant discolored mucus.  She is not more sob, and does not feel overly congested in her chest.  Her sats have been fine.  No fever.    After further discussion, we have decided to hold off on abx for now, but she is to call back if worsening pulmonary symptoms or feels this is heading the wrong way.  She is to stay on her mucinex, and drink fluids.

## 2011-07-25 ENCOUNTER — Telehealth: Payer: Self-pay | Admitting: Critical Care Medicine

## 2011-07-25 NOTE — Telephone Encounter (Signed)
Pt aware no further recs from PW and will call the office if anything further is needed. For now pt to take Zpak as prescribed by Dr. Allyne Gee.

## 2011-07-25 NOTE — Telephone Encounter (Signed)
Since calling for an appt this morning, pt has spoke with Dr. Dorothyann Peng office and they are calling in a Zpak to her pharmacy. She no longer wanted to schedule an appt to be seen here today. She said she felt like if she just gets the abx in her she will be fine. Pt c/o sore throat, sever cough with "kelly" green mucus. Ribs are very sore from the cough. She denies any increased sob, wheezing or f/c/s. She sounded very hoarse. Pt did ask that we forward the msg to PW for any additional recs from him. Pls advise. Allergies  Allergen Reactions  . Esomeprazole Magnesium   . Peanut-Containing Drug Products   . Shellfish Allergy

## 2011-07-25 NOTE — Telephone Encounter (Signed)
Lets see how she does on the zpak Nothing else to offer

## 2011-08-04 ENCOUNTER — Other Ambulatory Visit: Payer: Self-pay | Admitting: Critical Care Medicine

## 2011-08-05 ENCOUNTER — Other Ambulatory Visit: Payer: Medicare Other

## 2011-08-05 ENCOUNTER — Other Ambulatory Visit: Payer: Self-pay | Admitting: Gynecology

## 2011-08-05 NOTE — Progress Notes (Signed)
Addended by: Rushie Goltz on: 08/05/2011 01:27 PM   Modules accepted: Orders

## 2011-08-06 LAB — CALCIUM, URINE, 24 HOUR: Calcium, Ur: 21 mg/dL

## 2011-08-08 ENCOUNTER — Ambulatory Visit (INDEPENDENT_AMBULATORY_CARE_PROVIDER_SITE_OTHER): Payer: Medicare Other | Admitting: Critical Care Medicine

## 2011-08-08 ENCOUNTER — Encounter: Payer: Self-pay | Admitting: Critical Care Medicine

## 2011-08-08 VITALS — BP 116/70 | HR 80 | Temp 97.5°F | Ht 62.0 in | Wt 135.8 lb

## 2011-08-08 DIAGNOSIS — J449 Chronic obstructive pulmonary disease, unspecified: Secondary | ICD-10-CM

## 2011-08-08 MED ORDER — PREDNISONE 5 MG PO TABS: ORAL_TABLET | ORAL | Status: DC

## 2011-08-08 NOTE — Patient Instructions (Signed)
Reduce prednisone to 1/2 daily for 7 days then 1/2 every other day for 7days then stop No other medication changes Return 3 months

## 2011-08-08 NOTE — Assessment & Plan Note (Signed)
Severe golds D. COPD oxygen and steroid dependent Plan Attempt to taper prednisone to off over the next 2 weeks Maintain inhaled medications as prescribed

## 2011-08-08 NOTE — Progress Notes (Signed)
Subjective:    Patient ID: [name], female    DOB: 03/18/1951, 61 y.o.   MRN: [id number]  HPI  61 y.o.  woman with severe COPD Golds Stage IV    12/12 Since last OV,  Not wearing oxygen.   No new issues Pt denies any significant sore throat, nasal congestion or excess secretions, fever, chills, sweats, unintended weight loss, pleurtic or exertional chest pain, orthopnea PND, or leg swelling Pt denies any increase in rescue therapy over baseline, denies waking up needing it or having any early am or nocturnal exacerbations of coughing/wheezing/or dyspnea. Pt also denies any obvious fluctuation in symptoms with  weather or environmental change or other alleviating or aggravating factors  08/08/2011 Pt had a cough in Feb.  Rx Abx Zpak.  Now is better.  No real wheeze.  No edema in feet. Pt denies any significant sore throat, nasal congestion or excess secretions, fever, chills, sweats, unintended weight loss, pleurtic or exertional chest pain, orthopnea PND, or leg swelling Pt denies any increase in rescue therapy over baseline, denies waking up needing it or having any early am or nocturnal exacerbations of coughing/wheezing/or dyspnea. Pt also denies any obvious fluctuation in symptoms with  weather or environmental change or other alleviating or aggravating factors   Past Medical History  Diagnosis Date  . Other diseases of vocal cords   . Osteoporosis, unspecified   . Other and unspecified hyperlipidemia   . Esophageal reflux   . Chronic airway obstruction, not elsewhere classified   . Regional enteritis of unspecified site   . 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 Type II or unspecified type diabetes mellitus without mention of complication, not stated as uncontrolled   . Crohn's disease     Ileostomy  . Fibroid   . Pelvic adhesions   . Anxiety   . Depression      Family History  Problem Relation Age of Onset  . Osteoporosis Mother   .  Hypertension Sister   . Stroke Sister   . Diabetes Sister      History   Social History  . Marital Status: Divorced    Spouse Name: N/A    Number of Children: N/A  . Years of Education: N/A   Occupational History  . Not on file.   Social History Main Topics  . Smoking status: Former Smoker -- 1.0 packs/day for 32 years    Quit date: 05/27/2003  . Smokeless tobacco: Never Used  . Alcohol Use: Yes     occasional  . Drug Use: Not on file  . Sexually Active: Yes    Birth Control/ Protection: Surgical   Other Topics Concern  . Not on file   Social History Narrative  . No narrative on file     Allergies  Allergen Reactions  . Esomeprazole Magnesium   . Peanut-Containing Drug Products   . Shellfish Allergy      Outpatient Prescriptions Prior to Visit  Medication Sig Dispense Refill  . B-D ULTRAFINE III SHORT PEN 31G X 8 MM MISC       . bisoprolol (ZEBETA) 5 MG tablet Take 5 mg by mouth daily.        . Cholecalciferol (VITAMIN D) 1000 UNITS capsule Take 1,000 Units by mouth daily.        . citalopram (CELEXA) 20 MG tablet Take 20 mg by mouth daily.        [name] diltiazem (CARDIZEM CD) 180 MG 24 hr capsule Take  180 mg by mouth 2 (two) times daily.       . furosemide (LASIX) 20 MG tablet Take 1-2 tablets by mouth Once daily as needed.      [name] guaiFENesin (MUCINEX) 600 MG 12 hr tablet 1 tablet in the am, 2 tablet at lunch, 1 tablet in the evening.      [name] HYDROcodone-acetaminophen (VICODIN) 5-500 MG per tablet Take 1 tablet by mouth every 6 (six) hours as needed.        . Liraglutide (VICTOZA) 18 MG/3ML SOLN 1.8 units once daily      . Multiple Minerals-Vitamins (CITRACAL PLUS) TABS Take 1 tablet by mouth 2 (two) times daily.       . Multiple Vitamin (MULTIVITAMIN) capsule Take 1 capsule by mouth daily.        [name] omeprazole (PRILOSEC) 40 MG capsule Take 40 mg by mouth 2 (two) times daily.       [name] PROVENTIL HFA 108 (90 BASE) MCG/ACT inhaler USE 1 TO 2 PUFFS EVERY 4 TO 6 HOURS AS  NEEDED.  6.7 g  2  . rosuvastatin (CRESTOR) 10 MG tablet Take 10 mg by mouth daily.        [name] SPIRIVA HANDIHALER 18 MCG inhalation capsule inhale the contents of one capsule in the handihaler once daily  30 capsule  1  . zafirlukast (ACCOLATE) 10 MG tablet Take 1 tablet (10 mg total) by mouth 2 (two) times daily.  60 tablet  5  . zoledronic acid (RECLAST) 5 MG/100ML SOLN Inject 5 mg into the vein once.        . predniSONE (DELTASONE) 5 MG tablet take 1 tablet by mouth once daily  30 tablet  5     Review of Systems  Constitutional:   No  weight loss, night sweats,  Fevers, chills, fatigue, or  lassitude.  HEENT:   No headaches,  Difficulty swallowing,  Tooth/dental problems, or  Sore throat,                No sneezing, itching, ear ache, nasal congestion, post nasal drip,   CV:  No chest pain,  Orthopnea, PND, swelling in lower extremities, anasarca, dizziness, palpitations, syncope.   GI  No heartburn, indigestion, abdominal pain, nausea, vomiting, diarrhea, change in bowel habits, loss of appetite, bloody stools.   Resp:    No excess mucus, no productive cough,  No non-productive cough,  No coughing up of blood.  No change in color of mucus.  No wheezing.  No chest wall deformity  Skin: no rash or lesions.  GU: no dysuria, change in color of urine, no urgency or frequency.  No flank pain, no hematuria   MS:  No joint pain or swelling.  No decreased range of motion.  No back pain.  Psych:  No change in mood or affect. No depression or anxiety.  No memory loss.        Objective:   Physical Exam  Filed Vitals:   08/08/11 0845  BP: 116/70  Pulse: 80  Temp: 97.5 F (36.4 C)  TempSrc: Oral  Height: 5\' 2"  (1.575 m)  Weight: 135 lb 12.8 oz (61.598 kg)  SpO2: 99%    Gen: Pleasant, well-nourished, in no distress,  normal affect  ENT: No lesions,  mouth clear,  oropharynx clear, no postnasal drip  Neck: No JVD, no TMG, no carotid bruits  Lungs: No use of accessory muscles,  no dullness to percussion, distant BS  Cardiovascular: RRR, heart sounds  normal, no murmur or gallops, no peripheral edema  Abdomen: soft and NT, no HSM,  BS normal  Musculoskeletal: No deformities, no cyanosis or clubbing  Neuro: alert, non focal  Skin: Warm, no lesions or rashes           Assessment & Plan:   COPD Severe golds D. COPD oxygen and steroid dependent Plan Attempt to taper prednisone to off over the next 2 weeks Maintain inhaled medications as prescribed     Updated Medication List Outpatient Encounter Prescriptions as of 08/08/2011  Medication Sig Dispense Refill  . B-D ULTRAFINE III SHORT PEN 31G X 8 MM MISC       . bisoprolol (ZEBETA) 5 MG tablet Take 5 mg by mouth daily.        . Cholecalciferol (VITAMIN D) 1000 UNITS capsule Take 1,000 Units by mouth daily.        . citalopram (CELEXA) 20 MG tablet Take 20 mg by mouth daily.        [name] diltiazem (CARDIZEM CD) 180 MG 24 hr capsule Take 180 mg by mouth 2 (two) times daily.       . furosemide (LASIX) 20 MG tablet Take 1-2 tablets by mouth Once daily as needed.      [name] guaiFENesin (MUCINEX) 600 MG 12 hr tablet 1 tablet in the am, 2 tablet at lunch, 1 tablet in the evening.      [name] HYDROcodone-acetaminophen (VICODIN) 5-500 MG per tablet Take 1 tablet by mouth every 6 (six) hours as needed.        . Liraglutide (VICTOZA) 18 MG/3ML SOLN 1.8 units once daily      . Multiple Minerals-Vitamins (CITRACAL PLUS) TABS Take 1 tablet by mouth 2 (two) times daily.       . Multiple Vitamin (MULTIVITAMIN) capsule Take 1 capsule by mouth daily.        [name] omeprazole (PRILOSEC) 40 MG capsule Take 40 mg by mouth 2 (two) times daily.       . predniSONE (DELTASONE) 5 MG tablet Reduce to 1/2 tablet daily for 7 days then 1/2 tablet every other day for 7 days then stop  30 tablet  5  . PROVENTIL HFA 108 (90 BASE) MCG/ACT inhaler USE 1 TO 2 PUFFS EVERY 4 TO 6 HOURS AS NEEDED.  6.7 g  2  . rosuvastatin (CRESTOR) 10 MG tablet Take 10 mg  by mouth daily.        [name] SPIRIVA HANDIHALER 18 MCG inhalation capsule inhale the contents of one capsule in the handihaler once daily  30 capsule  1  . zafirlukast (ACCOLATE) 10 MG tablet Take 1 tablet (10 mg total) by mouth 2 (two) times daily.  60 tablet  5  . zoledronic acid (RECLAST) 5 MG/100ML SOLN Inject 5 mg into the vein once.        [name] DISCONTD: predniSONE (DELTASONE) 5 MG tablet take 1 tablet by mouth once daily  30 tablet  5

## 2011-08-20 ENCOUNTER — Other Ambulatory Visit: Payer: Self-pay | Admitting: Critical Care Medicine

## 2011-08-28 ENCOUNTER — Telehealth: Payer: Self-pay | Admitting: Critical Care Medicine

## 2011-08-28 DIAGNOSIS — J449 Chronic obstructive pulmonary disease, unspecified: Secondary | ICD-10-CM

## 2011-08-28 NOTE — Telephone Encounter (Signed)
I spoke with [name] and she stated that she needs a new portable nebulizer machine and the nebulizer machine for the home. She states she went and saw the PA over at Dr. Lelon Perla office on Monday bc she felt like a lump of mucus in her throat and was advised she was having a reaction to seasonal allergies. She was given a neb tx and zpak to take. She was told she needed to continue her neb tx's at home. She states her current one is broke and she has had it since 2005/2006. Please advise Dr. [name], thanks

## 2011-08-28 NOTE — Telephone Encounter (Signed)
I spoke with pt and is aware have sent order to [facility] to get her new neb machine. Nothing further was needed

## 2011-08-28 NOTE — Telephone Encounter (Signed)
i am ok with a new nebulizer for this patient

## 2011-09-06 ENCOUNTER — Encounter (HOSPITAL_COMMUNITY): Payer: Self-pay

## 2011-09-06 ENCOUNTER — Emergency Department (HOSPITAL_COMMUNITY): Payer: Medicare Other

## 2011-09-06 ENCOUNTER — Inpatient Hospital Stay (HOSPITAL_COMMUNITY)
Admission: EM | Admit: 2011-09-06 | Discharge: 2011-09-08 | DRG: 191 | Disposition: A | Discharge status: Home / Self Care | Payer: Medicare Other | Attending: Internal Medicine | Admitting: Internal Medicine

## 2011-09-06 ENCOUNTER — Telehealth: Payer: Self-pay | Admitting: Critical Care Medicine

## 2011-09-06 DIAGNOSIS — Z9981 Dependence on supplemental oxygen: Secondary | ICD-10-CM

## 2011-09-06 DIAGNOSIS — I498 Other specified cardiac arrhythmias: Secondary | ICD-10-CM | POA: Diagnosis present

## 2011-09-06 DIAGNOSIS — Z888 Allergy status to other drugs, medicaments and biological substances status: Secondary | ICD-10-CM

## 2011-09-06 DIAGNOSIS — K219 Gastro-esophageal reflux disease without esophagitis: Secondary | ICD-10-CM | POA: Diagnosis present

## 2011-09-06 DIAGNOSIS — Z91010 Allergy to peanuts: Secondary | ICD-10-CM

## 2011-09-06 DIAGNOSIS — K5090 Crohn's disease, unspecified, without complications: Secondary | ICD-10-CM | POA: Diagnosis present

## 2011-09-06 DIAGNOSIS — F411 Generalized anxiety disorder: Secondary | ICD-10-CM | POA: Diagnosis present

## 2011-09-06 DIAGNOSIS — J449 Chronic obstructive pulmonary disease, unspecified: Secondary | ICD-10-CM

## 2011-09-06 DIAGNOSIS — Z91013 Allergy to seafood: Secondary | ICD-10-CM

## 2011-09-06 DIAGNOSIS — Z79899 Other long term (current) drug therapy: Secondary | ICD-10-CM

## 2011-09-06 DIAGNOSIS — Z6828 Body mass index (BMI) 28.0-28.9, adult: Secondary | ICD-10-CM

## 2011-09-06 DIAGNOSIS — E785 Hyperlipidemia, unspecified: Secondary | ICD-10-CM | POA: Diagnosis present

## 2011-09-06 DIAGNOSIS — J9610 Chronic respiratory failure, unspecified whether with hypoxia or hypercapnia: Secondary | ICD-10-CM | POA: Diagnosis present

## 2011-09-06 DIAGNOSIS — F3289 Other specified depressive episodes: Secondary | ICD-10-CM | POA: Diagnosis present

## 2011-09-06 DIAGNOSIS — J441 Chronic obstructive pulmonary disease with (acute) exacerbation: Principal | ICD-10-CM

## 2011-09-06 DIAGNOSIS — E119 Type 2 diabetes mellitus without complications: Secondary | ICD-10-CM | POA: Diagnosis present

## 2011-09-06 DIAGNOSIS — IMO0002 Reserved for concepts with insufficient information to code with codable children: Secondary | ICD-10-CM

## 2011-09-06 DIAGNOSIS — M810 Age-related osteoporosis without current pathological fracture: Secondary | ICD-10-CM | POA: Diagnosis present

## 2011-09-06 DIAGNOSIS — Z87891 Personal history of nicotine dependence: Secondary | ICD-10-CM

## 2011-09-06 DIAGNOSIS — F329 Major depressive disorder, single episode, unspecified: Secondary | ICD-10-CM | POA: Diagnosis present

## 2011-09-06 HISTORY — DX: Essential (primary) hypertension: I10

## 2011-09-06 HISTORY — DX: Pneumonia, unspecified organism: J18.9

## 2011-09-06 LAB — CBC
HCT: 35.1 % — ABNORMAL LOW (ref 36.0–46.0)
MCHC: 31.9 g/dL (ref 30.0–36.0)
Platelets: 263 10*3/uL (ref 150–400)
RDW: 14.4 % (ref 11.5–15.5)

## 2011-09-06 LAB — DIFFERENTIAL
Basophils Absolute: 0.1 10*3/uL (ref 0.0–0.1)
Basophils Relative: 1 % (ref 0–1)
Monocytes Absolute: 0.7 10*3/uL (ref 0.1–1.0)
Neutro Abs: 3.4 10*3/uL (ref 1.7–7.7)
Neutrophils Relative %: 47 % (ref 43–77)

## 2011-09-06 LAB — BASIC METABOLIC PANEL
Calcium: 9.4 mg/dL (ref 8.4–10.5)
Chloride: 103 mEq/L (ref 96–112)
Creatinine, Ser: 0.94 mg/dL (ref 0.50–1.10)
GFR calc Af Amer: 74 mL/min — ABNORMAL LOW (ref >90)

## 2011-09-06 MED ORDER — METHYLPREDNISOLONE SODIUM SUCC 125 MG IJ SOLR
60.0000 mg | Freq: Four times a day (QID) | INTRAMUSCULAR | Status: DC
  Administered 2011-09-06 – 2011-09-07 (×4): 60 mg via INTRAVENOUS
  Filled 2011-09-06 (×5): qty 0.96

## 2011-09-06 MED ORDER — VITAMIN D3 25 MCG (1000 UNIT) PO TABS
1000.0000 [IU] | ORAL_TABLET | Freq: Every day | ORAL | Status: DC
  Administered 2011-09-07 – 2011-09-08 (×2): 1000 [IU] via ORAL
  Filled 2011-09-06 (×2): qty 1

## 2011-09-06 MED ORDER — HYDROMORPHONE HCL PF 1 MG/ML IJ SOLN
1.0000 mg | INTRAMUSCULAR | Status: DC | PRN
Start: 2011-09-06 — End: 2011-09-08

## 2011-09-06 MED ORDER — INSULIN GLARGINE 100 UNIT/ML ~~LOC~~ SOLN
10.0000 [IU] | Freq: Every day | SUBCUTANEOUS | Status: DC
  Administered 2011-09-06 – 2011-09-07 (×2): 10 [IU] via SUBCUTANEOUS

## 2011-09-06 MED ORDER — ONDANSETRON HCL 4 MG/2ML IJ SOLN: 4.0000 mg | Freq: Four times a day (QID) | INTRAMUSCULAR | Status: DC | PRN

## 2011-09-06 MED ORDER — DILTIAZEM HCL ER COATED BEADS 180 MG PO CP24
180.0000 mg | ORAL_CAPSULE | Freq: Two times a day (BID) | ORAL | Status: DC
  Administered 2011-09-06 – 2011-09-08 (×4): 180 mg via ORAL
  Filled 2011-09-06 (×6): qty 1

## 2011-09-06 MED ORDER — MULTIVITAMINS PO CAPS: 1.0000 | ORAL_CAPSULE | Freq: Every day | ORAL | Status: DC

## 2011-09-06 MED ORDER — LEVOFLOXACIN IN D5W 750 MG/150ML IV SOLN
750.0000 mg | INTRAVENOUS | Status: DC
  Administered 2011-09-06 – 2011-09-08 (×3): 750 mg via INTRAVENOUS
  Filled 2011-09-06 (×4): qty 150

## 2011-09-06 MED ORDER — IPRATROPIUM BROMIDE 0.02 % IN SOLN
0.5000 mg | RESPIRATORY_TRACT | Status: DC
  Administered 2011-09-06 – 2011-09-08 (×13): 0.5 mg via RESPIRATORY_TRACT
  Filled 2011-09-06 (×14): qty 2.5

## 2011-09-06 MED ORDER — ALBUTEROL SULFATE (5 MG/ML) 0.5% IN NEBU
5.0000 mg | INHALATION_SOLUTION | Freq: Once | RESPIRATORY_TRACT | Status: AC
  Administered 2011-09-06: 5 mg via RESPIRATORY_TRACT
  Filled 2011-09-06: qty 1

## 2011-09-06 MED ORDER — DIPHENHYDRAMINE HCL 50 MG/ML IJ SOLN
25.0000 mg | Freq: Once | INTRAMUSCULAR | Status: AC
  Administered 2011-09-06: 25 mg via INTRAVENOUS

## 2011-09-06 MED ORDER — IPRATROPIUM BROMIDE 0.02 % IN SOLN
0.5000 mg | Freq: Once | RESPIRATORY_TRACT | Status: AC
  Administered 2011-09-06: 0.5 mg via RESPIRATORY_TRACT
  Filled 2011-09-06: qty 2.5

## 2011-09-06 MED ORDER — ADULT MULTIVITAMIN W/MINERALS CH
1.0000 | ORAL_TABLET | Freq: Every day | ORAL | Status: DC
  Administered 2011-09-07 – 2011-09-08 (×2): 1 via ORAL
  Filled 2011-09-06 (×2): qty 1

## 2011-09-06 MED ORDER — ALBUTEROL SULFATE (5 MG/ML) 0.5% IN NEBU
15.0000 mg | INHALATION_SOLUTION | Freq: Once | RESPIRATORY_TRACT | Status: AC
  Administered 2011-09-06: 15 mg via RESPIRATORY_TRACT
  Filled 2011-09-06: qty 3

## 2011-09-06 MED ORDER — ATORVASTATIN CALCIUM 20 MG PO TABS
20.0000 mg | ORAL_TABLET | Freq: Every day | ORAL | Status: DC
  Administered 2011-09-06 – 2011-09-07 (×2): 20 mg via ORAL
  Filled 2011-09-06 (×3): qty 1

## 2011-09-06 MED ORDER — HYDROCODONE-ACETAMINOPHEN 5-325 MG PO TABS
1.0000 | ORAL_TABLET | ORAL | Status: DC | PRN
  Administered 2011-09-06 – 2011-09-07 (×2): 2 via ORAL
  Filled 2011-09-06 (×2): qty 2

## 2011-09-06 MED ORDER — MONTELUKAST SODIUM 10 MG PO TABS
10.0000 mg | ORAL_TABLET | Freq: Every day | ORAL | Status: DC
  Administered 2011-09-06 – 2011-09-07 (×2): 10 mg via ORAL
  Filled 2011-09-06 (×3): qty 1

## 2011-09-06 MED ORDER — INSULIN ASPART 100 UNIT/ML ~~LOC~~ SOLN: 0.0000 [IU] | Freq: Every day | SUBCUTANEOUS | Status: DC

## 2011-09-06 MED ORDER — SODIUM CHLORIDE 0.9 % IJ SOLN
3.0000 mL | Freq: Two times a day (BID) | INTRAMUSCULAR | Status: DC
  Administered 2011-09-06 – 2011-09-08 (×4): 3 mL via INTRAVENOUS

## 2011-09-06 MED ORDER — LEVALBUTEROL HCL 0.63 MG/3ML IN NEBU
0.6300 mg | INHALATION_SOLUTION | RESPIRATORY_TRACT | Status: DC
  Administered 2011-09-06 – 2011-09-08 (×12): 0.63 mg via RESPIRATORY_TRACT
  Filled 2011-09-06 (×20): qty 3

## 2011-09-06 MED ORDER — PANTOPRAZOLE SODIUM 40 MG PO TBEC
80.0000 mg | DELAYED_RELEASE_TABLET | Freq: Every day | ORAL | Status: DC
  Administered 2011-09-07 – 2011-09-08 (×2): 80 mg via ORAL
  Filled 2011-09-06 (×2): qty 2

## 2011-09-06 MED ORDER — CITALOPRAM HYDROBROMIDE 20 MG PO TABS
20.0000 mg | ORAL_TABLET | Freq: Every day | ORAL | Status: DC
  Administered 2011-09-07 – 2011-09-08 (×2): 20 mg via ORAL
  Filled 2011-09-06 (×2): qty 1

## 2011-09-06 MED ORDER — METHYLPREDNISOLONE SODIUM SUCC 125 MG IJ SOLR
125.0000 mg | Freq: Once | INTRAMUSCULAR | Status: AC
  Administered 2011-09-06: 125 mg via INTRAVENOUS
  Filled 2011-09-06: qty 2

## 2011-09-06 MED ORDER — GUAIFENESIN-CODEINE 100-10 MG/5ML PO SOLN
10.0000 mL | Freq: Four times a day (QID) | ORAL | Status: DC | PRN
  Administered 2011-09-07 (×2): 10 mL via ORAL
  Filled 2011-09-06: qty 5
  Filled 2011-09-06: qty 10

## 2011-09-06 MED ORDER — OXYCODONE-ACETAMINOPHEN 5-325 MG PO TABS
1.0000 | ORAL_TABLET | Freq: Once | ORAL | Status: AC
  Administered 2011-09-06: 1 via ORAL
  Filled 2011-09-06: qty 1

## 2011-09-06 MED ORDER — VITAMIN D 1000 UNITS PO CAPS: 1000.0000 [IU] | ORAL_CAPSULE | Freq: Every day | ORAL | Status: DC

## 2011-09-06 MED ORDER — LORAZEPAM 1 MG PO TABS
1.0000 mg | ORAL_TABLET | Freq: Every evening | ORAL | Status: DC | PRN
  Administered 2011-09-08: 1 mg via ORAL
  Filled 2011-09-06: qty 1

## 2011-09-06 MED ORDER — ACETAMINOPHEN 650 MG RE SUPP: 650.0000 mg | Freq: Four times a day (QID) | RECTAL | Status: DC | PRN

## 2011-09-06 MED ORDER — PREDNISONE 20 MG PO TABS
60.0000 mg | ORAL_TABLET | Freq: Once | ORAL | Status: AC
  Administered 2011-09-06: 60 mg via ORAL
  Filled 2011-09-06: qty 3

## 2011-09-06 MED ORDER — LEVALBUTEROL HCL 0.63 MG/3ML IN NEBU
0.6300 mg | INHALATION_SOLUTION | RESPIRATORY_TRACT | Status: DC | PRN
  Filled 2011-09-06: qty 3

## 2011-09-06 MED ORDER — ALUM & MAG HYDROXIDE-SIMETH 200-200-20 MG/5ML PO SUSP: 30.0000 mL | Freq: Four times a day (QID) | ORAL | Status: DC | PRN

## 2011-09-06 MED ORDER — INSULIN ASPART 100 UNIT/ML ~~LOC~~ SOLN
0.0000 [IU] | Freq: Three times a day (TID) | SUBCUTANEOUS | Status: DC
  Administered 2011-09-06 – 2011-09-07 (×2): 3 [IU] via SUBCUTANEOUS
  Administered 2011-09-07 – 2011-09-08 (×2): 2 [IU] via SUBCUTANEOUS
  Administered 2011-09-08: 3 [IU] via SUBCUTANEOUS

## 2011-09-06 MED ORDER — ALBUTEROL SULFATE (5 MG/ML) 0.5% IN NEBU
INHALATION_SOLUTION | RESPIRATORY_TRACT | Status: AC
  Filled 2011-09-06: qty 3

## 2011-09-06 MED ORDER — CICLESONIDE 80 MCG/ACT IN AERS
1.0000 | INHALATION_SPRAY | Freq: Two times a day (BID) | RESPIRATORY_TRACT | Status: DC
  Administered 2011-09-06 – 2011-09-08 (×4): 1 via RESPIRATORY_TRACT

## 2011-09-06 MED ORDER — BISOPROLOL FUMARATE 5 MG PO TABS
5.0000 mg | ORAL_TABLET | Freq: Every day | ORAL | Status: DC
  Administered 2011-09-07 – 2011-09-08 (×2): 5 mg via ORAL
  Filled 2011-09-06 (×2): qty 1

## 2011-09-06 MED ORDER — ZOLPIDEM TARTRATE 5 MG PO TABS
5.0000 mg | ORAL_TABLET | Freq: Every evening | ORAL | Status: DC | PRN
  Administered 2011-09-06: 5 mg via ORAL
  Filled 2011-09-06: qty 1

## 2011-09-06 MED ORDER — ACETAMINOPHEN 325 MG PO TABS
650.0000 mg | ORAL_TABLET | Freq: Four times a day (QID) | ORAL | Status: DC | PRN
  Administered 2011-09-07: 650 mg via ORAL
  Filled 2011-09-06: qty 2

## 2011-09-06 MED ORDER — WHITE PETROLATUM GEL
Status: AC
  Administered 2011-09-06: 16:00:00
  Filled 2011-09-06: qty 5

## 2011-09-06 MED ORDER — ONDANSETRON HCL 4 MG PO TABS: 4.0000 mg | ORAL_TABLET | Freq: Four times a day (QID) | ORAL | Status: DC | PRN

## 2011-09-06 MED ORDER — BENZONATATE 100 MG PO CAPS
100.0000 mg | ORAL_CAPSULE | Freq: Three times a day (TID) | ORAL | Status: DC
  Administered 2011-09-06 – 2011-09-08 (×6): 100 mg via ORAL
  Filled 2011-09-06 (×9): qty 1

## 2011-09-06 MED ORDER — DIPHENHYDRAMINE HCL 50 MG/ML IJ SOLN
INTRAMUSCULAR | Status: AC
  Filled 2011-09-06: qty 1

## 2011-09-06 NOTE — Telephone Encounter (Signed)
Phone call from [name] this AM stating that she has experienced increasing difficulty breathing this AM.  She states that last week she began having some lower back and shoulder pain along with some increased coughing associated with the pollen increase.  She went to her GP and saw the PA.  That HCP felt the patient was suffering from seasonal and environmental allergies and gave her albuterol nebs, a Z pack (finished this Sunday), and a steroid inhaler.  This AM she has more difficulty with breathing, with audible wheezing, and a cough productive of clear phlegm.  She denies fevers, N/V/D or chest pain.  She has used her emergency inhaler, drank water, used the steroid inhaler and gave herself a neb treatment approximately 1 hour ago.  All this coincided  with a stepdown in her prednisone dose.  During the phone conversation the patient is alert, oriented and in mild to moderate distress.  She can speak in short complete sentences but does have soft audible, moist wheezes. Plan: I have instructed the patient to take another neb treatment May need to consider increasing her steroid dose but will discuss with Dr. [name] since he is on today Patient was instructed that if her breathing worsens to come to the ED for further evaluation and treatment

## 2011-09-06 NOTE — ED Notes (Signed)
MD at bedside. 

## 2011-09-06 NOTE — H&P (Signed)
History and Physical       Hospital Admission Note Date: 09/06/2011  Patient name: [name] Medical record number: [id number] Date of birth: 01/02/1951 Age: 61 y.o. Gender: female PCP: [name], MD, MD  Attending physician: Cathren Harsh, MD Primary pulmonologist Dr. Shan Levans:   Chief Complaint:  Shortness of breath with wheezing   HPI: Patient is a 61 year old African American female with history of COPD, chronic respiratory failure dependent on 2 L oxygen all the time, GERD, history of Crohn's disease with ileostomy, hypertension, osteoporosis presented to [facility] ED with progressive shortness of breath and wheezing in the last 2 weeks. Patient states that her symptoms started 2 weeks ago, she had started wheezing and coughing, saw her PCP Dr. Allyne Gee and her pulmonologist Dr. [name]. Patient states that she was placed on Z-Pak which she has finished 5 days ago. She denied any fevers or chills however had cough with clear phlegm. Patient says that this morning she woke up very short of breath and not able to breathe with chest tightness and wheezing. She increased her oxygen to almost 4 L with no significant improvement. In the ED patient had appropriate nebulizer treatments with no significant relief and hence hospitalist service was requested for admission.   Review of Systems:  Constitutional: Denies fever, chills, diaphoresis, appetite change and fatigue.  HEENT: Denies photophobia, eye pain, redness, hearing loss, ear pain, congestion, sore throat, rhinorrhea, sneezing, mouth sores, trouble swallowing, neck pain, neck stiffness and tinnitus.   Respiratory:  please see history of present illness .   Cardiovascular: Denies chest pain, palpitations and leg swelling.  Gastrointestinal: Denies nausea, vomiting, abdominal pain, diarrhea, constipation, blood in stool and abdominal distention.  Genitourinary: Denies  dysuria, urgency, frequency, hematuria, flank pain and difficulty urinating.  Musculoskeletal: Denies myalgias, back pain, joint swelling, arthralgias and gait problem.  Skin: Denies pallor, rash and wound.  Neurological: Denies dizziness, seizures, syncope, weakness, light-headedness, numbness and headaches.  Hematological: Denies adenopathy. Easy bruising, personal or family bleeding history  Psychiatric/Behavioral: Denies suicidal ideation, mood changes, confusion, nervousness, sleep disturbance and agitation  Past Medical History: Past Medical History  Diagnosis Date  . Other diseases of vocal cords   . Osteoporosis, unspecified   . Other and unspecified hyperlipidemia   . Esophageal reflux   . Chronic airway obstruction, not elsewhere classified   . Regional enteritis of unspecified site   . 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 Type II or unspecified type diabetes mellitus without mention of complication, not stated as uncontrolled   . Crohn's disease     Ileostomy  . Fibroid   . Pelvic adhesions   . Anxiety   . Depression    Past Surgical History  Procedure Date  . Ileostomy   . Vaginal hysterectomy 1993    LAVH-LSO  . Ear cyst excision   . Cervical cone biopsy 1978  . Colon surgery 2007    Resection-Ileostomy  . Esophagus stretch   . Oophorectomy     LSO    Medications: Prior to Admission medications   Medication Sig Start Date End Date Taking? Authorizing Provider  albuterol (PROVENTIL HFA;VENTOLIN HFA) 108 (90 BASE) MCG/ACT inhaler Inhale 2 puffs into the lungs every 4 (four) hours as needed. Shortness of breath   Yes Historical Provider, MD  bisoprolol (ZEBETA) 5 MG tablet Take 5 mg by mouth daily.     Yes Historical Provider, MD  Cholecalciferol (VITAMIN D) 1000 UNITS capsule Take 1,000 Units by  mouth daily.     Yes Historical Provider, MD  ciclesonide (ALVESCO) 80 MCG/ACT inhaler Inhale 1 puff into the lungs 2 (two) times daily.   Yes  Historical Provider, MD  citalopram (CELEXA) 20 MG tablet Take 20 mg by mouth daily.     Yes Historical Provider, MD  diltiazem (CARDIZEM CD) 180 MG 24 hr capsule Take 180 mg by mouth 2 (two) times daily.    Yes Historical Provider, MD  guaiFENesin (MUCINEX) 600 MG 12 hr tablet 1 tablet in the am, 2 tablet at lunch, 1 tablet in the evening.   Yes Historical Provider, MD  HYDROcodone-acetaminophen (VICODIN) 5-500 MG per tablet Take 1 tablet by mouth every 6 (six) hours as needed. For pain   Yes Historical Provider, MD  Liraglutide (VICTOZA) 18 MG/3ML SOLN 1.8 units once daily   Yes Historical Provider, MD  Multiple Minerals-Vitamins (CITRACAL PLUS) TABS Take 1 tablet by mouth 2 (two) times daily.    Yes Historical Provider, MD  Multiple Vitamin (MULTIVITAMIN) capsule Take 1 capsule by mouth daily.     Yes Historical Provider, MD  omeprazole (PRILOSEC) 40 MG capsule Take 40 mg by mouth 2 (two) times daily.    Yes Historical Provider, MD  rosuvastatin (CRESTOR) 10 MG tablet Take 10 mg by mouth daily.     Yes Historical Provider, MD  SPIRIVA HANDIHALER 18 MCG inhalation capsule inhale the contents of one capsule in the handihaler once daily 08/20/11  Yes Storm Frisk, MD  zafirlukast (ACCOLATE) 10 MG tablet Take 1 tablet (10 mg total) by mouth 2 (two) times daily. 02/05/11 02/05/12 Yes Storm Frisk, MD  furosemide (LASIX) 20 MG tablet Take 1-2 tablets by mouth Once daily as needed. 11/27/10   Historical Provider, MD  predniSONE (DELTASONE) 5 MG tablet Reduce to 1/2 tablet daily for 7 days then 1/2 tablet every other day for 7 days then stop 08/08/11   Storm Frisk, MD  zoledronic acid (RECLAST) 5 MG/100ML SOLN Inject 5 mg into the vein once. Once a year, scheduled for May    Historical Provider, MD    Allergies:   Allergies  Allergen Reactions  . Esomeprazole Magnesium   . Peanut-Containing Drug Products   . Shellfish Allergy     Social History:  reports that she quit smoking about 8  years ago. She has never used smokeless tobacco. She reports that she drinks alcohol. Her drug history not on file.  Family History: Family History  Problem Relation Age of Onset  . Osteoporosis Mother   . Hypertension Sister   . Stroke Sister   . Diabetes Sister     Physical Exam: Blood pressure 110/68, pulse 99, temperature 98.2 F (36.8 C), temperature source Oral, resp. rate 15, SpO2 100.00%. General: Alert, awake, oriented x3, in no acute distress. HEENT: anicteric sclera, pink conjunctiva, pupils equal and reactive to light and accomodation Neck: supple, no masses or lymphadenopathy, no goiter, no bruits  Heart: Regular rate and rhythm, without murmurs, rubs or gallops. Lungs: poor airway entry with diffuse wheezing bilaterally  Abdomen:  ileostomy, no bleeding, Soft, nontender, nondistended, positive bowel sounds, no masses. Extremities: No clubbing, cyanosis or edema with positive pedal pulses. Neuro: Grossly intact, no focal neurological deficits, strength 5/5 upper and lower extremities bilaterally Psych: alert and oriented x 3, normal mood and affect Skin: no rashes or lesions, warm and dry   LABS on Admission:  Basic Metabolic Panel:  Lab 09/06/11 1610  NA 140  K 4.0  CL 103  CO2 26  GLUCOSE 84  BUN 6  CREATININE 0.94  CALCIUM 9.4  MG --  PHOS --   CBC:  Lab 09/06/11 0855  WBC 7.1  NEUTROABS 3.4  HGB 11.2*  HCT 35.1*  MCV 87.3  PLT 263     Radiological Exams on Admission: Dg Chest Port 1 View  09/06/2011  *RADIOLOGY REPORT*  Clinical Data: Respiratory distress.  Low grade fever. Hypertension.  Diabetes.  COPD.  PORTABLE CHEST - 1 VIEW  Comparison: 08/26/2010  Findings: Stable scarring noted in the right upper lobe.  Emphysema is present. Cardiac and mediastinal contours appear unremarkable.  No pleural effusion is observed.  IMPRESSION:  1.  Emphysema. 2.  Stable right upper lobe scarring.  Original Report Authenticated By: Dellia Cloud, M.D.     Assessment/Plan Present on Admission:   .COPD with acute exacerbation AND acute on chronic respiratory failure  - Will admit to telemetry floor, placed on scheduled q4 hours Xopenex and Atrovent nebs treatment, IV Solu-Medrol, inhaled steroids, levofloxacin - Continue oxygen supplementation, home O2 evaluation at the time of disposition - Incentive spirometry, antitussives  .DIABETES, TYPE 2 - Obtain HbA1c - will place on Lantus and moderate sliding scale insulin as as patient will be on steroids to compensate for hyperglycemia   .HYPERLIPIDEMIA - Continue statins   .DEPRESSIVE DISORDER NOT ELSEWHERE CLASSIFIED - Stable, continue Celexa   .MULTIFOCAL ATRIAL TACHYCARDIA - Continue beta blocker and Cardizem, Xopenex nebulizer treatments   .GERD: Placed on PPI   .CROHN'S DISEASE - Continue steroids, ileostomy care   .OSTEOPOROSIS: - Continue vitamin D replacement   DVT prophylaxis: SCDs   CODE STATUS: FullCode status   Further plan will depend as patient's clinical course evolves and further radiologic and laboratory data become available.   @Time  Spent on Admission: 1 hour Hershy Flenner M.D. [facility] 09/06/2011, 1:22 PM

## 2011-09-06 NOTE — ED Notes (Signed)
Carb modified medium diet ordered

## 2011-09-06 NOTE — ED Provider Notes (Addendum)
History     CSN: [id number]  Arrival date & time 09/06/11  0808   First MD Initiated Contact with Patient 09/06/11 [phone]      Chief Complaint  Patient presents with  . Shortness of Breath    (Consider location/radiation/quality/duration/timing/severity/associated sxs/prior treatment) The history is provided by the patient.   the patient is a 61 year old, female, with oxygen-dependent COPD.  She is a former smoker.  She complains of progressive shortness of breath and nonproductive cough for the past several weeks.  She denies pain anywhere.  She denies nausea, vomiting, fevers, chills, leg pain or swelling.  Past Medical History  Diagnosis Date  . Other diseases of vocal cords   . Osteoporosis, unspecified   . Other and unspecified hyperlipidemia   . Esophageal reflux   . Chronic airway obstruction, not elsewhere classified   . Regional enteritis of unspecified site   . 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 Type II or unspecified type diabetes mellitus without mention of complication, not stated as uncontrolled   . Crohn's disease     Ileostomy  . Fibroid   . Pelvic adhesions   . Anxiety   . Depression     Past Surgical History  Procedure Date  . Ileostomy   . Vaginal hysterectomy 1993    LAVH-LSO  . Ear cyst excision   . Cervical cone biopsy 1978  . Colon surgery 2007    Resection-Ileostomy  . Esophagus stretch   . Oophorectomy     LSO    Family History  Problem Relation Age of Onset  . Osteoporosis Mother   . Hypertension Sister   . Stroke Sister   . Diabetes Sister     History  Substance Use Topics  . Smoking status: Former Smoker -- 1.0 packs/day for 32 years    Quit date: 05/27/2003  . Smokeless tobacco: Never Used  . Alcohol Use: Yes     occasional    OB History    Grav Para Term Preterm Abortions TAB SAB Ect Mult Living   3 0   3           Review of Systems  Constitutional: Negative for fever and chills.  HENT:  Negative for congestion.   Respiratory: Positive for cough and shortness of breath. Negative for chest tightness.   Cardiovascular: Negative for chest pain, palpitations and leg swelling.  Gastrointestinal: Negative for nausea and vomiting.  Neurological: Negative for headaches.  Psychiatric/Behavioral: Negative for confusion.  All other systems reviewed and are negative.    Allergies  Esomeprazole magnesium; Peanut-containing drug products; and Shellfish allergy  Home Medications   Current Outpatient Rx  Name Route Sig Dispense Refill  . ALBUTEROL SULFATE HFA 108 (90 BASE) MCG/ACT IN AERS Inhalation Inhale 2 puffs into the lungs every 4 (four) hours as needed. Shortness of breath    . BISOPROLOL FUMARATE 5 MG PO TABS Oral Take 5 mg by mouth daily.      [name] VITAMIN D 1000 UNITS PO CAPS Oral Take 1,000 Units by mouth daily.      [name] CICLESONIDE 80 MCG/ACT IN AERS Inhalation Inhale 1 puff into the lungs 2 (two) times daily.    [name] CITALOPRAM HYDROBROMIDE 20 MG PO TABS Oral Take 20 mg by mouth daily.      [name] DILTIAZEM HCL ER COATED BEADS 180 MG PO CP24 Oral Take 180 mg by mouth 2 (two) times daily.     . GUAIFENESIN ER 600 MG PO TB12  1 tablet in the am, 2 tablet at lunch, 1 tablet in the evening.    [name] HYDROCODONE-ACETAMINOPHEN 5-500 MG PO TABS Oral Take 1 tablet by mouth every 6 (six) hours as needed. For pain    . LIRAGLUTIDE 18 MG/3ML [place] SOLN  1.8 units once daily    . CITRACAL PLUS PO TABS Oral Take 1 tablet by mouth 2 (two) times daily.     . MULTIVITAMINS PO CAPS Oral Take 1 capsule by mouth daily.      [name] OMEPRAZOLE 40 MG PO CPDR Oral Take 40 mg by mouth 2 (two) times daily.     [name] ROSUVASTATIN CALCIUM 10 MG PO TABS Oral Take 10 mg by mouth daily.      [name] SPIRIVA HANDIHALER 18 MCG IN CAPS  inhale the contents of one capsule in the handihaler once daily 30 capsule 5  . ZAFIRLUKAST 10 MG PO TABS Oral Take 1 tablet (10 mg total) by mouth 2 (two) times daily. 60 tablet 5  . FUROSEMIDE 20 MG PO  TABS Oral Take 1-2 tablets by mouth Once daily as needed.    [name] PREDNISONE 5 MG PO TABS  Reduce to 1/2 tablet daily for 7 days then 1/2 tablet every other day for 7 days then stop 30 tablet 5  . ZOLEDRONIC ACID 5 MG/100ML IV SOLN Intravenous Inject 5 mg into the vein once. Once a year, scheduled for May      BP 114/60  Pulse 94  Temp(Src) 98.2 F (36.8 C) (Oral)  Resp 22  SpO2 96%  Physical Exam  Vitals reviewed. Constitutional: She is oriented to person, place, and time. She appears well-developed and well-nourished. She appears distressed.  HENT:  Head: Normocephalic and atraumatic.  Eyes: Conjunctivae are normal.  Neck: Normal range of motion. Neck supple.  Cardiovascular: Normal rate.   No murmur heard. Pulmonary/Chest: She is in respiratory distress. She has wheezes. She has no rales.  Abdominal: Soft. She exhibits no distension.  Musculoskeletal: Normal range of motion. She exhibits no edema.  Neurological: She is alert and oriented to person, place, and time.  Skin: Skin is warm and dry.  Psychiatric: Thought content normal.       Anxious    ED Course  Procedures (including critical care time) 61 year old, female, with severe oxygen-dependent COPD, presents with shortness of breath and nonproductive cough for several weeks.  We will establish an IV chest x-ray, and laboratory testing, and treat her with respiratory nebs and steroids  Labs Reviewed  CBC  DIFFERENTIAL  BASIC METABOLIC PANEL   No results found.   No diagnosis found.  9:37 AM Still has diffuse harsh wheezes and rhonchi. She says she feels herself "rattling"  11:19 AM Still diffuse wheezes.  Feels she is not at baseline.  CRITICAL CARE Performed by: Nicholes Stairs   Total critical care time: 30 min  Critical care time was exclusive of separately billable procedures and treating other patients.  Critical care was necessary to treat or prevent imminent or life-threatening  deterioration.  Critical care was time spent personally by me on the following activities: development of treatment plan with patient and/or surrogate as well as nursing, discussions with consultants, evaluation of patient's response to treatment, examination of patient, obtaining history from patient or surrogate, ordering and performing treatments and interventions, ordering and review of laboratory studies, ordering and review of radiographic studies, pulse oximetry and re-evaluation of patient's condition.  12:09 PM Spoke with [facility]. She will admit for uncontrolled  copd.   MDM  COPD exacerbation. Not resolved in ed. No pneumonia        Cheri Guppy, MD 09/06/11 1120  Cheri Guppy, MD 09/06/11 1209

## 2011-09-06 NOTE — ED Notes (Signed)
Pt. Reports developing sob and asthmatic bronchits on Good Friday.  Pt. Has a hx  Bronchitis and COPD.  Symptoms are becoming worse.    And pt. Is sob and wheezing . Pt. Is on home oxygen 1 to 1.5l [place]  Pt. Denies any pain. Has increased her oxygen to 4l

## 2011-09-07 DIAGNOSIS — J441 Chronic obstructive pulmonary disease with (acute) exacerbation: Principal | ICD-10-CM

## 2011-09-07 DIAGNOSIS — J449 Chronic obstructive pulmonary disease, unspecified: Secondary | ICD-10-CM

## 2011-09-07 LAB — BASIC METABOLIC PANEL WITH GFR
BUN: 14 mg/dL (ref 6–23)
CO2: 27 meq/L (ref 19–32)
Calcium: 9.3 mg/dL (ref 8.4–10.5)
Chloride: 105 meq/L (ref 96–112)
Creatinine, Ser: 0.94 mg/dL (ref 0.50–1.10)
GFR calc Af Amer: 74 mL/min — ABNORMAL LOW
GFR calc non Af Amer: 64 mL/min — ABNORMAL LOW
Glucose, Bld: 153 mg/dL — ABNORMAL HIGH (ref 70–99)
Potassium: 3.8 meq/L (ref 3.5–5.1)
Sodium: 140 meq/L (ref 135–145)

## 2011-09-07 LAB — MRSA PCR SCREENING: MRSA by PCR: POSITIVE — AB

## 2011-09-07 LAB — CBC
HCT: 33.3 % — ABNORMAL LOW (ref 36.0–46.0)
Hemoglobin: 10.5 g/dL — ABNORMAL LOW (ref 12.0–15.0)
MCH: 27.2 pg (ref 26.0–34.0)
MCHC: 31.5 g/dL (ref 30.0–36.0)
MCV: 86.3 fL (ref 78.0–100.0)

## 2011-09-07 LAB — GLUCOSE, CAPILLARY
Glucose-Capillary: 155 mg/dL — ABNORMAL HIGH (ref 70–99)
Glucose-Capillary: 101 mg/dL — ABNORMAL HIGH (ref 70–99)
Glucose-Capillary: 141 mg/dL — ABNORMAL HIGH (ref 70–99)

## 2011-09-07 MED ORDER — METHYLPREDNISOLONE SODIUM SUCC 125 MG IJ SOLR
60.0000 mg | Freq: Three times a day (TID) | INTRAMUSCULAR | Status: DC
  Administered 2011-09-07 – 2011-09-08 (×3): 60 mg via INTRAVENOUS
  Filled 2011-09-07 (×4): qty 0.96

## 2011-09-07 MED ORDER — ENOXAPARIN SODIUM 40 MG/0.4ML ~~LOC~~ SOLN
40.0000 mg | SUBCUTANEOUS | Status: DC
  Administered 2011-09-08: 40 mg via SUBCUTANEOUS
  Filled 2011-09-07 (×2): qty 0.4

## 2011-09-07 NOTE — Progress Notes (Signed)
PATIENT DETAILS Name: [name] Age: 61 y.o. Sex: female Date of Birth: 1950-12-12 Admit Date: 09/06/2011 [id number] N, MD, MD  Subjective: Feeling better this am compared to yesterday-not back to her baseline yet  Objective: Vital signs in last 24 hours: Filed Vitals:   09/06/11 2155 09/07/11 0509 09/07/11 0723 09/07/11 0950  BP: 113/86 112/69  121/72  Pulse: 116 103    Temp: 98.3 F (36.8 C) 98.2 F (36.8 C)    TempSrc: Oral Oral    Resp: 20 18    Height:      Weight:      SpO2: 97% 98% 97%     Weight change:   Body mass index is 28.32 kg/(m^2).  Intake/Output from previous day:  Intake/Output Summary (Last 24 hours) at 09/07/11 1017 Last data filed at 09/07/11 0952  Gross per 24 hour  Intake    393 ml  Output    675 ml  Net   -282 ml    PHYSICAL EXAM: Gen Exam: Awake and alert with clear speech.   Neck: Supple, No JVD.   Chest: B/L Clear.   CVS: S1 S2 Regular, no murmurs.  Abdomen: soft, BS +, non tender, non distended.  Extremities: no edema, lower extremities warm to touch. Neurologic: Non Focal.   Skin: No Rash.   Wounds: N/A.    CONSULTS:  None  LAB RESULTS: CBC  Lab 09/07/11 0535 09/06/11 0855  WBC 9.5 7.1  HGB 10.5* 11.2*  HCT 33.3* 35.1*  PLT 228 263  MCV 86.3 87.3  MCH 27.2 27.9  MCHC 31.5 31.9  RDW 14.3 14.4  LYMPHSABS -- 2.1  MONOABS -- 0.7  EOSABS -- 0.9*  BASOSABS -- 0.1  BANDABS -- --    Chemistries   Lab 09/07/11 0535 09/06/11 0855  NA 140 140  K 3.8 4.0  CL 105 103  CO2 27 26  GLUCOSE 153* 84  BUN 14 6  CREATININE 0.94 0.94  CALCIUM 9.3 9.4  MG -- --    GFR Estimated Creatinine Clearance: 54.1 ml/min (by C-G formula based on Cr of 0.94).  Coagulation profile No results found for this basename: INR:5,PROTIME:5 in the last 168 hours  Cardiac Enzymes No results found for this basename: CK:3,CKMB:3,TROPONINI:3,MYOGLOBIN:3 in the last 168 hours  No components found with this basename:  POCBNP:3 No results found for this basename: DDIMER:2 in the last 72 hours  Basename 09/06/11 1530  HGBA1C 5.6   No results found for this basename: CHOL:2,HDL:2,LDLCALC:2,TRIG:2,CHOLHDL:2,LDLDIRECT:2 in the last 72 hours No results found for this basename: TSH,T4TOTAL,FREET3,T3FREE,THYROIDAB in the last 72 hours No results found for this basename: VITAMINB12:2,FOLATE:2,FERRITIN:2,TIBC:2,IRON:2,RETICCTPCT:2 in the last 72 hours No results found for this basename: LIPASE:2,AMYLASE:2 in the last 72 hours  Urine Studies No results found for this basename: UACOL:2,UAPR:2,USPG:2,UPH:2,UTP:2,UGL:2,UKET:2,UBIL:2,UHGB:2,UNIT:2,UROB:2,ULEU:2,UEPI:2,UWBC:2,URBC:2,UBAC:2,CAST:2,CRYS:2,UCOM:2,BILUA:2 in the last 72 hours  MICROBIOLOGY: No results found for this or any previous visit (from the past 240 hour(s)).  RADIOLOGY STUDIES/RESULTS: Dg Chest Port 1 View  09/06/2011  *RADIOLOGY REPORT*  Clinical Data: Respiratory distress.  Low grade fever. Hypertension.  Diabetes.  COPD.  PORTABLE CHEST - 1 VIEW  Comparison: 08/26/2010  Findings: Stable scarring noted in the right upper lobe.  Emphysema is present. Cardiac and mediastinal contours appear unremarkable.  No pleural effusion is observed.  IMPRESSION:  1.  Emphysema. 2.  Stable right upper lobe scarring.  Original Report Authenticated By: Dellia Cloud, M.D.    MEDICATIONS: Scheduled Meds:   . atorvastatin  20 mg Oral q1800  .  benzonatate  100 mg Oral TID  . bisoprolol  5 mg Oral Daily  . cholecalciferol  1,000 Units Oral Daily  . ciclesonide  1 puff Inhalation BID  . citalopram  20 mg Oral Daily  . diltiazem  180 mg Oral BID  . diphenhydrAMINE  25 mg Intravenous Once  . insulin aspart  0-15 Units Subcutaneous TID WC  . insulin aspart  0-5 Units Subcutaneous QHS  . insulin glargine  10 Units Subcutaneous QHS  . ipratropium  0.5 mg Nebulization Q4H  . levalbuterol  0.63 mg Nebulization Q4H  . levofloxacin (LEVAQUIN) IV  750 mg  Intravenous Q24H  . methylPREDNISolone (SOLU-MEDROL) injection  125 mg Intravenous Once  . methylPREDNISolone (SOLU-MEDROL) injection  60 mg Intravenous Q6H  . montelukast  10 mg Oral QHS  . mulitivitamin with minerals  1 tablet Oral Daily  . oxyCODONE-acetaminophen  1 tablet Oral Once  . pantoprazole  80 mg Oral Q1200  . sodium chloride  3 mL Intravenous Q12H  . white petrolatum      . DISCONTD: albuterol      . DISCONTD: multivitamin  1 capsule Oral Daily  . DISCONTD: Vitamin D  1,000 Units Oral Daily   Continuous Infusions:  PRN Meds:.acetaminophen, acetaminophen, alum & mag hydroxide-simeth, guaiFENesin-codeine, HYDROcodone-acetaminophen, HYDROmorphone, levalbuterol, LORazepam, ondansetron (ZOFRAN) IV, ondansetron, zolpidem  Antibiotics: Anti-infectives     Start     Dose/Rate Route Frequency Ordered Stop   09/06/11 1400   levofloxacin (LEVAQUIN) IVPB 750 mg        750 mg 100 mL/hr over 90 Minutes Intravenous Every 24 hours 09/06/11 1311            Assessment/Plan: Patient Active Hospital Problem List: COPD with acute exacerbation  -patient reports slight improvement today -continue with Xopenex/Atrovent nebulizers, Solumedrol and empiric Levaquin  DIABETES, TYPE 2 -CBG's stable -continue with 10 units of Lantus, SSI -monitor CBG's closely  HYPERLIPIDEMIA  -continue with Lipitor  H/o MULTIFOCAL ATRIAL TACHYCARDIA  -Tele-Sinus Tachycardia-likely 2/2 Xopenex-monitor for now -continue with cardizem and bisoprolol   DEPRESSIVE DISORDER - Stable, continue Celexa   GERD:  Continue with  PPI   CROHN'S DISEASE  - not on any meds for this following total colectomy - ileostomy care   OSTEOPOROSIS:  - Continue vitamin D replacement   Disposition: Remain inpatient  DVT Prophylaxis: Prophylactic Lovenox  Code Status: Full Code  Maretta Bees, MD. 09/07/2011, 10:17 AM

## 2011-09-07 NOTE — Progress Notes (Signed)
Pt seen and examined.  I appreciate [facility] help in this pts care. She has improved with ABX , nebs, Steroids.  Patient Active Hospital Problem List: COPD with acute exacerbation (09/06/2011)   Assessment: acute exac now improved. Note she is Stage D/IV Golds COPD   Plan: cont current rx plan, no new recs   GERD (01/27/2007)   Assessment: GERD is a major ppt factore   Plan: agree with PPI dosing   Pulmonary will f/u with you and suspect she will be able to d/c soon in 1-2 days   Shan Levans Beeper  [phone]  Cell  [phone]  If no response or cell goes to voicemail, call beeper [phone]

## 2011-09-08 LAB — GLUCOSE, CAPILLARY: Glucose-Capillary: 146 mg/dL — ABNORMAL HIGH (ref 70–99)

## 2011-09-08 MED ORDER — IPRATROPIUM BROMIDE 0.02 % IN SOLN
0.5000 mg | Freq: Four times a day (QID) | RESPIRATORY_TRACT | Status: DC
  Administered 2011-09-08: 0.5 mg via RESPIRATORY_TRACT
  Filled 2011-09-08: qty 2.5

## 2011-09-08 MED ORDER — CHLORHEXIDINE GLUCONATE CLOTH 2 % EX PADS
6.0000 | MEDICATED_PAD | Freq: Every day | CUTANEOUS | Status: DC
  Administered 2011-09-08: 6 via TOPICAL

## 2011-09-08 MED ORDER — PREDNISONE 20 MG PO TABS: 40.0000 mg | ORAL_TABLET | Freq: Every day | ORAL | Status: DC

## 2011-09-08 MED ORDER — LEVALBUTEROL HCL 0.63 MG/3ML IN NEBU: 0.6300 mg | INHALATION_SOLUTION | Freq: Three times a day (TID) | RESPIRATORY_TRACT | Status: DC

## 2011-09-08 MED ORDER — LEVALBUTEROL HCL 0.63 MG/3ML IN NEBU
0.6300 mg | INHALATION_SOLUTION | Freq: Four times a day (QID) | RESPIRATORY_TRACT | Status: DC
  Administered 2011-09-08: 0.63 mg via RESPIRATORY_TRACT
  Filled 2011-09-08 (×3): qty 3

## 2011-09-08 MED ORDER — MUPIROCIN 2 % EX OINT
1.0000 "application " | TOPICAL_OINTMENT | Freq: Two times a day (BID) | CUTANEOUS | Status: DC
  Administered 2011-09-08 (×2): 1 via NASAL
  Filled 2011-09-08: qty 22

## 2011-09-08 MED ORDER — LEVOFLOXACIN 750 MG PO TABS: 750.0000 mg | ORAL_TABLET | Freq: Every day | ORAL | Status: AC

## 2011-09-08 MED ORDER — LEVALBUTEROL HCL 0.63 MG/3ML IN NEBU: 0.6300 mg | INHALATION_SOLUTION | RESPIRATORY_TRACT | Status: DC | PRN

## 2011-09-08 MED ORDER — IPRATROPIUM BROMIDE 0.02 % IN SOLN: 0.5000 mg | Freq: Three times a day (TID) | RESPIRATORY_TRACT | Status: DC

## 2011-09-08 NOTE — Progress Notes (Addendum)
RT completed patient assessment. Patient has a history of COPD, and had necrotizing Pneumonia in 2010 per patient.   Patient takes breathing treatments three times per day at home.  Patient was getting treatments every four hours here.  RT changed treatments to four times a day with a Q4prn.  RT made patient aware of changes.  RT will continue to monitor.

## 2011-09-08 NOTE — Progress Notes (Signed)
   CARE MANAGEMENT NOTE 09/08/2011  Patient:  [name], [name]   Account Number:  [id number]  Date Initiated:  09/08/2011  Documentation initiated by:  Letha Cape  Subjective/Objective Assessment:   dx copd ex  admit- lives with family.     Action/Plan:   Anticipated DC Date:  09/08/2011   Anticipated DC Plan:  HOME/SELF CARE      DC Planning Services  CM consult      Choice offered to / List presented to:             Status of service:  Completed, signed off Medicare Important Message given?   (If response is "NO", the following Medicare IM given date fields will be blank) Date Medicare IM given:   Date Additional Medicare IM given:    Discharge Disposition:  HOME/SELF CARE  Per UR Regulation:    If discussed at Long Length of Stay Meetings, dates discussed:    Comments:  09/08/11 15:23 Letha Cape RN, BSN [phone] patient lives with family, pta independent.  No needs identified.  NCM will continue to follow for dc needs.

## 2011-09-08 NOTE — Progress Notes (Signed)
PCCM NOTE  S: Pt improved. Less cough.  Dyspnea is better.  OCeasar Mons Vitals:   09/07/11 1300 09/07/11 2236 09/08/11 0641 09/08/11 0713  BP: 120/70 140/91 128/84   Pulse: 103 113 98   Temp: 98.5 F (36.9 C) 98.6 F (37 C) 98.2 F (36.8 C)   TempSrc: Oral Oral Oral   Resp: 18 20 19    Height:      Weight:      SpO2: 97% 99% 98% 100%    Gen: Pleasant, well-nourished, in no distress,  normal affect  ENT: No lesions,  mouth clear,  oropharynx clear, no postnasal drip  Neck: No JVD, no TMG, no carotid bruits  Lungs: No use of accessory muscles, no dullness to percussion,  Prominent pseudowheeze  Cardiovascular: RRR, heart sounds normal, no murmur or gallops, no peripheral edema  Abdomen: soft and NT, no HSM,  BS normal  Musculoskeletal: No deformities, no cyanosis or clubbing  Neuro: alert, non focal  Skin: Warm, no lesions or rashes  Dg Chest Port 1 View  09/06/2011  *RADIOLOGY REPORT*  Clinical Data: Respiratory distress.  Low grade fever. Hypertension.  Diabetes.  COPD.  PORTABLE CHEST - 1 VIEW  Comparison: 08/26/2010  Findings: Stable scarring noted in the right upper lobe.  Emphysema is present. Cardiac and mediastinal contours appear unremarkable.  No pleural effusion is observed.  IMPRESSION:  1.  Emphysema. 2.  Stable right upper lobe scarring.  Original Report Authenticated By: Dellia Cloud, M.D.   A/P:  Copd/ AB flare with GERD features: improved Plan: Prob ok for d/c this PM. Needs Prednisone Slow taper Start 40mg /d and stay at this level until seen in office Po ABX ok Keep sugar free lozenge in throat at all times Cough suppression GERD Rx Other BD meds as before Will need OV f/u within 7days with Rubye Oaks our office NP Call [phone] for appt.   Shan Levans Beeper  [phone]  Cell  [phone]  If no response or cell goes to voicemail, call beeper [phone]

## 2011-09-08 NOTE — Progress Notes (Signed)
Patient discharge instruction has been reviewed. Patient verbalized understand and no questions or concerns verbailzed. Skin intact

## 2011-09-08 NOTE — Progress Notes (Signed)
Utilization review completed.  

## 2011-09-08 NOTE — Discharge Summary (Signed)
PATIENT DETAILS Name: [name] Age: 61 y.o. Sex: female Date of Birth: [birth year] MRN: [id number]. Admit Date: 09/06/2011 Admitting Physician: Maretta Bees, MD [id number] N, MD, MD  PRIMARY DISCHARGE DIAGNOSIS:  Principal Problem:  *COPD with acute exacerbation Active Problems:  DIABETES, TYPE 2  HYPERLIPIDEMIA  DEPRESSIVE DISORDER NOT ELSEWHERE CLASSIFIED  MULTIFOCAL ATRIAL TACHYCARDIA  GERD  CROHN'S DISEASE  OSTEOPOROSIS      PAST MEDICAL HISTORY: Past Medical History  Diagnosis Date  . Other diseases of vocal cords   . Osteoporosis, unspecified   . Other and unspecified hyperlipidemia   . Esophageal reflux   . Chronic airway obstruction, not elsewhere classified   . Regional enteritis of unspecified site   . 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 Type II or unspecified type diabetes mellitus without mention of complication, not stated as uncontrolled   . Crohn's disease     Ileostomy  . Fibroid   . Pelvic adhesions   . Anxiety   . Depression   . Hypertension   . Shortness of breath   . Pneumonia   . Asthma     DISCHARGE MEDICATIONS: Medication List  As of 09/08/2011 11:24 AM   TAKE these medications         albuterol 108 (90 BASE) MCG/ACT inhaler   Commonly known as: PROVENTIL HFA;VENTOLIN HFA   Inhale 2 puffs into the lungs every 4 (four) hours as needed. Shortness of breath      bisoprolol 5 MG tablet   Commonly known as: ZEBETA   Take 5 mg by mouth daily.      ciclesonide 80 MCG/ACT inhaler   Commonly known as: ALVESCO   Inhale 1 puff into the lungs 2 (two) times daily.      citalopram 20 MG tablet   Commonly known as: CELEXA   Take 20 mg by mouth daily.      Citracal Plus Tabs   Take 1 tablet by mouth 2 (two) times daily.      diltiazem 180 MG 24 hr capsule   Commonly known as: CARDIZEM CD   Take 180 mg by mouth 2 (two) times daily.      furosemide 20 MG tablet   Commonly known as: LASIX   Take 1-2  tablets by mouth Once daily as needed.      guaiFENesin 600 MG 12 hr tablet   Commonly known as: MUCINEX   1 tablet in the am, 2 tablet at lunch, 1 tablet in the evening.      HYDROcodone-acetaminophen 5-500 MG per tablet   Commonly known as: VICODIN   Take 1 tablet by mouth every 6 (six) hours as needed. For pain      ipratropium 0.02 % nebulizer solution   Commonly known as: ATROVENT   Take 2.5 mLs (0.5 mg total) by nebulization 3 (three) times daily.      levalbuterol 0.63 MG/3ML nebulizer solution   Commonly known as: XOPENEX   Take 3 mLs (0.63 mg total) by nebulization every 2 (two) hours as needed for wheezing.      levalbuterol 0.63 MG/3ML nebulizer solution   Commonly known as: XOPENEX   Take 3 mLs (0.63 mg total) by nebulization 3 (three) times daily.      levofloxacin 750 MG tablet   Commonly known as: LEVAQUIN   Take 1 tablet (750 mg total) by mouth daily.      multivitamin capsule   Take 1 capsule by mouth daily.  omeprazole 40 MG capsule   Commonly known as: PRILOSEC   Take 40 mg by mouth 2 (two) times daily.      predniSONE 20 MG tablet   Commonly known as: DELTASONE   Take 2 tablets (40 mg total) by mouth daily.      RECLAST 5 MG/100ML Soln   Generic drug: zoledronic acid   Inject 5 mg into the vein once. Once a year, scheduled for May      rosuvastatin 10 MG tablet   Commonly known as: CRESTOR   Take 10 mg by mouth daily.      SPIRIVA HANDIHALER 18 MCG inhalation capsule   Generic drug: tiotropium   inhale the contents of one capsule in the handihaler once daily      VICTOZA 18 MG/3ML Soln   Generic drug: Liraglutide   1.8 units once daily      Vitamin D 1000 UNITS capsule   Take 1,000 Units by mouth daily.      zafirlukast 10 MG tablet   Commonly known as: ACCOLATE   Take 1 tablet (10 mg total) by mouth 2 (two) times daily.             BRIEF HPI:  See H&P, Labs, Consult and Test reports for all details in brief, Patient is a  61 year old African American female with history of COPD, chronic respiratory failure dependent on 2 L oxygen all the time, GERD, history of Crohn's disease with ileostomy, hypertension, osteoporosis presented to [facility] ED with progressive shortness of breath and wheezing in the last 2 weeks. Patient states that her symptoms started 2 weeks ago, she had started wheezing and coughing, saw her PCP Dr. Allyne Gee and her pulmonologist Dr. [name]. Patient states that she was placed on Z-Pak which she has finished 5 days ago. She denied any fevers or chills however had cough with clear phlegm. Patient says that on the  Morning of admission, she woke up very short of breath and not able to breathe with chest tightness and wheezing. She increased her oxygen to almost 4 L with no significant improvement. In the ED patient had appropriate nebulizer treatments with no significant relief and hence hospitalist service was requested for admission.   CONSULTATIONS:   pulmonary/intensive care  PERTINENT RADIOLOGIC STUDIES: Dg Chest Port 1 View  09/06/2011  *RADIOLOGY REPORT*  Clinical Data: Respiratory distress.  Low grade fever. Hypertension.  Diabetes.  COPD.  PORTABLE CHEST - 1 VIEW  Comparison: 08/26/2010  Findings: Stable scarring noted in the right upper lobe.  Emphysema is present. Cardiac and mediastinal contours appear unremarkable.  No pleural effusion is observed.  IMPRESSION:  1.  Emphysema. 2.  Stable right upper lobe scarring.  Original Report Authenticated By: Dellia Cloud, M.D.     PERTINENT LAB RESULTS: CBC:  Basename 09/07/11 0535 09/06/11 0855  WBC 9.5 7.1  HGB 10.5* 11.2*  HCT 33.3* 35.1*  PLT 228 263   CMET CMP     Component Value Date/Time   NA 140 09/07/2011 0535   K 3.8 09/07/2011 0535   CL 105 09/07/2011 0535   CO2 27 09/07/2011 0535   GLUCOSE 153* 09/07/2011 0535   BUN 14 09/07/2011 0535   CREATININE 0.94 09/07/2011 0535   CREATININE 0.98 07/15/2011 0920   CALCIUM 9.3 09/07/2011  0535   CALCIUM 9.0 07/15/2011 0920   PROT 6.4 07/15/2011 0920   ALBUMIN 3.4* 07/15/2011 0920   AST 18 07/15/2011 0920   ALT 21 07/15/2011 0920  ALKPHOS 73 07/15/2011 0920   BILITOT 0.2* 07/15/2011 0920   GFRNONAA 64* 09/07/2011 0535   GFRAA 74* 09/07/2011 0535    GFR Estimated Creatinine Clearance: 54.1 ml/min (by C-G formula based on Cr of 0.94). No results found for this basename: LIPASE:2,AMYLASE:2 in the last 72 hours No results found for this basename: CKTOTAL:3,CKMB:3,CKMBINDEX:3,TROPONINI:3 in the last 72 hours No components found with this basename: POCBNP:3 No results found for this basename: DDIMER:2 in the last 72 hours  Basename 09/06/11 1530  HGBA1C 5.6   No results found for this basename: CHOL:2,HDL:2,LDLCALC:2,TRIG:2,CHOLHDL:2,LDLDIRECT:2 in the last 72 hours No results found for this basename: TSH,T4TOTAL,FREET3,T3FREE,THYROIDAB in the last 72 hours No results found for this basename: VITAMINB12:2,FOLATE:2,FERRITIN:2,TIBC:2,IRON:2,RETICCTPCT:2 in the last 72 hours Coags: No results found for this basename: PT:2,INR:2 in the last 72 hours Microbiology: Recent Results (from the past 240 hour(s))  MRSA PCR SCREENING     Status: Abnormal   Collection Time   09/07/11  6:09 PM      Component Value Range Status Comment   MRSA by PCR POSITIVE (*) NEGATIVE  Final      BRIEF HOSPITAL COURSE:   Principal Problem:   *COPD with acute exacerbation -as noted above patient was admitted with worsening of her shortness of breath. She was admitted to the hospital, given nebulized bronchodilators and intravenous solumedrol. She was also placed on empiric Levaquin and PPI. With these measures she quickly improved. She was also seen by Dr Chales Salmon has suggested she stay on prednisone 40 mg/daily till she is seen at the pulmonary clinic. Today she is significantly better and is very close to being at her usual baseline, current plans are to monitor her for the afternoon, and then  re-evaluate in the evening-and if continues to do well, will discharge her later today  DIABETES, TYPE 2  -CBG's stable -continue with Victoza on discharge  HYPERLIPIDEMIA  -continue with Lipitor   H/o MULTIFOCAL ATRIAL TACHYCARDIA  -Tele-Sinus Tachycardia-stable -continue with cardizem and bisoprolol   DEPRESSIVE DISORDER  - Stable, continue Celexa   GERD:  Continue with PPI   CROHN'S DISEASE  - not on any meds for this following total colectomy  - ileostomy care   OSTEOPOROSIS:  - Continue vitamin D replacement   TODAY-DAY OF DISCHARGE:  Subjective:   Qiara Risby today has no headache,no chest abdominal pain,no new weakness tingling or numbness, feels much better wants to go home today.  Objective:   Blood pressure 125/77, pulse 98, temperature 98.2 F (36.8 C), temperature source Oral, resp. rate 19, height 5' 0.4" (1.534 m), weight 66.661 kg (146 lb 15.4 oz), SpO2 100.00%.  Intake/Output Summary (Last 24 hours) at 09/08/11 1124 Last data filed at 09/08/11 0650  Gross per 24 hour  Intake    240 ml  Output    400 ml  Net   -160 ml    Exam Awake Alert, Oriented *3, No new F.N deficits, Normal affect [place].AT,PERRAL Supple Neck,No JVD, No cervical lymphadenopathy appriciated.  Symmetrical Chest wall movement, Good air movement bilaterally, CTAB RRR,No Gallops,Rubs or new Murmurs, No Parasternal Heave +ve B.Sounds, Abd Soft, Non tender, No organomegaly appriciated, No rebound -guarding or rigidity. No Cyanosis, Clubbing or edema, No new Rash or bruise  DISPOSITION: Home  DISCHARGE INSTRUCTIONS:    Follow-up Information    Follow up with PARRETT,TAMMY, NP on 09/16/2011. (appointment at 2:30 pm)    Contact information:   [facility] [address] [place] [address] [phone]  Follow up with [name], MD. Schedule an appointment as soon as possible for a visit in 2 weeks.   Contact information:   [address] [place] [address] [phone]         Total Time spent on discharge equals 45 minutes.  SignedJeoffrey Massed 09/08/2011 11:24 AM

## 2011-09-16 ENCOUNTER — Inpatient Hospital Stay: Payer: Medicare Other | Admitting: Adult Health

## 2011-09-19 ENCOUNTER — Telehealth: Payer: Self-pay | Admitting: Adult Health

## 2011-09-19 ENCOUNTER — Encounter: Payer: Self-pay | Admitting: Adult Health

## 2011-09-19 ENCOUNTER — Ambulatory Visit (INDEPENDENT_AMBULATORY_CARE_PROVIDER_SITE_OTHER): Payer: Medicare Other | Admitting: Adult Health

## 2011-09-19 VITALS — BP 110/78 | HR 84 | Temp 98.1°F | Ht 62.0 in | Wt 136.4 lb

## 2011-09-19 DIAGNOSIS — J449 Chronic obstructive pulmonary disease, unspecified: Secondary | ICD-10-CM

## 2011-09-19 NOTE — Progress Notes (Signed)
Subjective:    Patient ID: [name], female    DOB: 10-07-1950, 61 y.o.   MRN: [id number]  HPI  61 y.o.  woman with severe COPD Golds Stage IV    12/12 Since last OV,  Not wearing oxygen.   No new issues  08/08/11  Pt had a cough in Feb.  Rx Abx Zpak.  Now is better.  No real wheeze.  No edema in feet. >no changes   09/19/2011 Post Hospital  Patient returns for a post hospital followup. She was admitted April 13 of 09/08/2011 for an acute COPD exacerbation. She was treated with IV antibiotics, nebulized bronchodilators, and steroids. She responded very quickly and was discharged on a prednisone taper. Chest x-ray showed chronic changes without any acute process. Currently on Prednisone 40mg  daily  She was started on atrovent neb at discharge ? Taking or not.  Taking spiriva daily  Using albuterol neb Three times a day  . Did not get xopenex -not covered.  She denies any chest pain, hemoptysis, abdominal pain, nausea, vomiting, or leg swelling.    Past Medical History  Diagnosis Date  . Other diseases of vocal cords   . Osteoporosis, unspecified   . Other and unspecified hyperlipidemia   . Esophageal reflux   . Chronic airway obstruction, not elsewhere classified   . Regional enteritis of unspecified site   . 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 Type II or unspecified type diabetes mellitus without mention of complication, not stated as uncontrolled   . Crohn's disease     Ileostomy  . Fibroid   . Pelvic adhesions   . Anxiety   . Depression   . Hypertension   . Shortness of breath   . Pneumonia   . Asthma      Family History  Problem Relation Age of Onset  . Osteoporosis Mother   . Hypertension Sister   . Stroke Sister   . Diabetes Sister      History   Social History  . Marital Status: Divorced    Spouse Name: N/A    Number of Children: N/A  . Years of Education: N/A   Occupational History  . Not on file.   Social History  Main Topics  . Smoking status: Former Smoker -- 1.0 packs/day for 32 years    Quit date: 05/27/2003  . Smokeless tobacco: Never Used  . Alcohol Use: 0.0 oz/week    0 Glasses of wine per week     occasional  . Drug Use: Not on file  . Sexually Active: Yes    Birth Control/ Protection: Surgical   Other Topics Concern  . Not on file   Social History Narrative  . No narrative on file     Allergies  Allergen Reactions  . Esomeprazole Magnesium   . Peanut-Containing Drug Products   . Shellfish Allergy      Outpatient Prescriptions Prior to Visit  Medication Sig Dispense Refill  . albuterol (PROVENTIL HFA;VENTOLIN HFA) 108 (90 BASE) MCG/ACT inhaler Inhale 2 puffs into the lungs every 4 (four) hours as needed. Shortness of breath      . bisoprolol (ZEBETA) 5 MG tablet Take 5 mg by mouth daily.        . Cholecalciferol (VITAMIN D) 1000 UNITS capsule Take 1,000 Units by mouth daily.        . ciclesonide (ALVESCO) 80 MCG/ACT inhaler Inhale 1 puff into the lungs 2 (two) times daily.      [name]  citalopram (CELEXA) 20 MG tablet Take 20 mg by mouth daily.        [name] diltiazem (CARDIZEM CD) 180 MG 24 hr capsule Take 180 mg by mouth 2 (two) times daily.       . furosemide (LASIX) 20 MG tablet Take 40 mg by mouth daily.       [name] guaiFENesin (MUCINEX) 600 MG 12 hr tablet 1 tablet in the am, 2 tablet at lunch, 1 tablet in the evening.      [name] HYDROcodone-acetaminophen (VICODIN) 5-500 MG per tablet Take 1 tablet by mouth every 6 (six) hours as needed. For pain      . ipratropium (ATROVENT) 0.02 % nebulizer solution Take 2.5 mLs (0.5 mg total) by nebulization 3 (three) times daily.  75 mL  0  . Liraglutide (VICTOZA) 18 MG/3ML SOLN 1.8 units once daily      . Multiple Minerals-Vitamins (CITRACAL PLUS) TABS Take 1 tablet by mouth 2 (two) times daily.       . Multiple Vitamin (MULTIVITAMIN) capsule Take 1 capsule by mouth daily.        [name] omeprazole (PRILOSEC) 40 MG capsule Take 40 mg by mouth 2 (two) times  daily.       . predniSONE (DELTASONE) 20 MG tablet Take 2 tablets (40 mg total) by mouth daily.  30 tablet  0  . rosuvastatin (CRESTOR) 10 MG tablet Take 10 mg by mouth daily.        [name] SPIRIVA HANDIHALER 18 MCG inhalation capsule inhale the contents of one capsule in the handihaler once daily  30 capsule  5  . zafirlukast (ACCOLATE) 10 MG tablet Take 1 tablet (10 mg total) by mouth 2 (two) times daily.  60 tablet  5  . zoledronic acid (RECLAST) 5 MG/100ML SOLN Inject 5 mg into the vein once. Once a year, scheduled for May      . levalbuterol (XOPENEX) 0.63 MG/3ML nebulizer solution Take 3 mLs (0.63 mg total) by nebulization every 2 (two) hours as needed for wheezing.  3 mL  0  . levofloxacin (LEVAQUIN) 750 MG tablet Take 1 tablet (750 mg total) by mouth daily.  4 tablet  0  . levalbuterol (XOPENEX) 0.63 MG/3ML nebulizer solution Take 3 mLs (0.63 mg total) by nebulization 3 (three) times daily.  3 mL       Review of Systems  Constitutional:   No  weight loss, night sweats,  Fevers, chills, fatigue, or  lassitude.  HEENT:   No headaches,  Difficulty swallowing,  Tooth/dental problems, or  Sore throat,                No sneezing, itching, ear ache, nasal congestion, post nasal drip,   CV:  No chest pain,  Orthopnea, PND, swelling in lower extremities, anasarca, dizziness, palpitations, syncope.   GI  No heartburn, indigestion, abdominal pain, nausea, vomiting, diarrhea, change in bowel habits, loss of appetite, bloody stools.   Resp:    No excess mucus, no productive cough,  No non-productive cough,  No coughing up of blood.  No change in color of mucus.  No wheezing.  No chest wall deformity  Skin: no rash or lesions.  GU: no dysuria, change in color of urine, no urgency or frequency.  No flank pain, no hematuria   MS:  No joint pain or swelling.  No decreased range of motion.  No back pain.  Psych:  No change in mood or affect. No depression or anxiety.  No  memory loss.          Objective:   Physical Exam  Filed Vitals:   09/19/11 0944  BP: 110/78  Pulse: 84  Temp: 98.1 F (36.7 C)  TempSrc: Oral  Height: 5\' 2"  (1.575 m)  Weight: 136 lb 6.4 oz (61.871 kg)  SpO2: 99%    Gen: Pleasant, well-nourished, in no distress,  normal affect  ENT: No lesions,  mouth clear,  oropharynx clear, no postnasal drip  Neck: No JVD, no TMG, no carotid bruits  Lungs: No use of accessory muscles, no dullness to percussion, distant BS  Cardiovascular: RRR, heart sounds normal, no murmur or gallops, no peripheral edema  Abdomen: soft and NT, no HSM,  BS normal  Musculoskeletal: No deformities, no cyanosis or clubbing  Neuro: alert, non focal  Skin: Warm, no lesions or rashes           Assessment & Plan:   No problem-specific assessment & plan notes found for this encounter.   Updated Medication List Outpatient Encounter Prescriptions as of 09/19/2011  Medication Sig Dispense Refill  . albuterol (PROVENTIL HFA;VENTOLIN HFA) 108 (90 BASE) MCG/ACT inhaler Inhale 2 puffs into the lungs every 4 (four) hours as needed. Shortness of breath      . bisoprolol (ZEBETA) 5 MG tablet Take 5 mg by mouth daily.        . Cholecalciferol (VITAMIN D) 1000 UNITS capsule Take 1,000 Units by mouth daily.        . ciclesonide (ALVESCO) 80 MCG/ACT inhaler Inhale 1 puff into the lungs 2 (two) times daily.      . citalopram (CELEXA) 20 MG tablet Take 20 mg by mouth daily.        [name] diltiazem (CARDIZEM CD) 180 MG 24 hr capsule Take 180 mg by mouth 2 (two) times daily.       . furosemide (LASIX) 20 MG tablet Take 40 mg by mouth daily.       [name] guaiFENesin (MUCINEX) 600 MG 12 hr tablet 1 tablet in the am, 2 tablet at lunch, 1 tablet in the evening.      [name] HYDROcodone-acetaminophen (VICODIN) 5-500 MG per tablet Take 1 tablet by mouth every 6 (six) hours as needed. For pain      . ipratropium (ATROVENT) 0.02 % nebulizer solution Take 2.5 mLs (0.5 mg total) by nebulization 3 (three) times  daily.  75 mL  0  . Liraglutide (VICTOZA) 18 MG/3ML SOLN 1.8 units once daily      . Multiple Minerals-Vitamins (CITRACAL PLUS) TABS Take 1 tablet by mouth 2 (two) times daily.       . Multiple Vitamin (MULTIVITAMIN) capsule Take 1 capsule by mouth daily.        [name] omeprazole (PRILOSEC) 40 MG capsule Take 40 mg by mouth 2 (two) times daily.       . predniSONE (DELTASONE) 20 MG tablet Take 2 tablets (40 mg total) by mouth daily.  30 tablet  0  . rosuvastatin (CRESTOR) 10 MG tablet Take 10 mg by mouth daily.        [name] SPIRIVA HANDIHALER 18 MCG inhalation capsule inhale the contents of one capsule in the handihaler once daily  30 capsule  5  . zafirlukast (ACCOLATE) 10 MG tablet Take 1 tablet (10 mg total) by mouth 2 (two) times daily.  60 tablet  5  . zoledronic acid (RECLAST) 5 MG/100ML SOLN Inject 5 mg into the vein once. Once a year, scheduled for May      .  levalbuterol (XOPENEX) 0.63 MG/3ML nebulizer solution Take 3 mLs (0.63 mg total) by nebulization every 2 (two) hours as needed for wheezing.  3 mL  0  . levofloxacin (LEVAQUIN) 750 MG tablet Take 1 tablet (750 mg total) by mouth daily.  4 tablet  0  . DISCONTD: levalbuterol (XOPENEX) 0.63 MG/3ML nebulizer solution Take 3 mLs (0.63 mg total) by nebulization 3 (three) times daily.  3 mL

## 2011-09-19 NOTE — Assessment & Plan Note (Signed)
Recent exacerbation, now resolved.  Previously, steroid dependent, however, she was tapered off of prednisone. Few weeks prior to admission. Patient is adamant that she wants to try again to come off the steroids. We will try once again, however, I told her that we may need to be on a very low dose of steroids to help with her chronic lung disease.  Plan:  Decrease Prednisone 30mg  daily for 5 days, then 20mg  daily for 5 days then 10mg  daily for 5 days then 1/2 daily for 5 days , and then stop. Stop Ipratropium NEB .   Continue on Spiriva daily  Use Albuterol Neb every 4 hr as needed for wheezing or increased shortness of breath.  follow up Dr. [name]  In 3-4 weeks and As needed

## 2011-09-19 NOTE — Telephone Encounter (Signed)
Called pt back, she was unable to speak per family member.  Will call her back on Monday.

## 2011-09-19 NOTE — Patient Instructions (Addendum)
Decrease Prednisone 30mg  daily for 5 days, then 20mg  daily for 5 days then 10mg  daily for 5 days then 1/2 daily for 5 days , and then stop. Stop Ipratropium NEB .   Continue on Spiriva daily  Use Albuterol Neb every 4 hr as needed for wheezing or increased shortness of breath.  follow up Dr. [name]  In 3-4 weeks and As needed

## 2011-09-19 NOTE — Telephone Encounter (Signed)
Spoke with pt. She states that she was told at ov today that TP's nurse would be calling her to let her know what inhaler to use. I advised per ov instructions- stop atrovent, cont spiriva, and use albuterol nebs prn. She states that she understands this, "but there was something else they were going to check on".  JJ, please advise, thanks!

## 2011-09-21 ENCOUNTER — Other Ambulatory Visit: Payer: Self-pay | Admitting: Critical Care Medicine

## 2011-09-22 NOTE — Telephone Encounter (Signed)
Called spoke with patient, verified that she is to stop the atrovent nebs and use the albuterol prn.  Pt verbalized her understanding and reported that her breathing is doing well and she had "a great weekend."  Advised pt to call if her breathing worsens. Nothing further needed, will sign off. Med list was updated at ov.

## 2011-09-26 ENCOUNTER — Telehealth: Payer: Self-pay | Admitting: Critical Care Medicine

## 2011-09-26 NOTE — Telephone Encounter (Signed)
Per 4.26.13 ov with TP:  Patient Instructions     Decrease Prednisone 30mg  daily for 5 days, then 20mg  daily for 5 days then 10mg  daily for 5 days then 1/2 daily for 5 days , and then stop.  Stop Ipratropium NEB .  Continue on Spiriva daily  Use Albuterol Neb every 4 hr as needed for wheezing or increased shortness of breath.  follow up Dr. [name] In 3-4 weeks and As needed     Has patient already finished the above pred taper?  LM with FM TCB x1.

## 2011-09-30 NOTE — Telephone Encounter (Signed)
lmomtcb x 2  

## 2011-10-01 MED ORDER — PREDNISONE 5 MG PO TABS: ORAL_TABLET | ORAL | Status: DC

## 2011-10-01 NOTE — Telephone Encounter (Signed)
Pt returned call and stated that she is currently doing the prednisone taper as prescribed by TP at the 4.26.13 ov > will begin the 10mg  on Friday 5.10.13.  Pt stated that she does not have enough of the prednisone 5mg  tabs to last until she finishes the taper and is requesting a rx be sent in to [facility] on [address].    Dr [name], may pt receive a refill on the prednisone for the purpose of finishing her taper?  Thanks.

## 2011-10-01 NOTE — Telephone Encounter (Signed)
This is ok with me  

## 2011-10-01 NOTE — Telephone Encounter (Signed)
Called spoke with patient, advised PEW okayed for her to have a refill on her prednisone.  Will send #15 with no refills to verified pharmacy, this will give pt approx 2 extra tabs.

## 2011-10-01 NOTE — Telephone Encounter (Signed)
lmomtcb  

## 2011-10-08 ENCOUNTER — Telehealth: Payer: Self-pay | Admitting: Critical Care Medicine

## 2011-10-08 NOTE — Telephone Encounter (Signed)
[email or web address].Daejah Klebba, CMA  

## 2011-10-09 ENCOUNTER — Telehealth: Payer: Self-pay | Admitting: Pulmonary Disease

## 2011-10-09 MED ORDER — PREDNISONE 5 MG PO TABS: ORAL_TABLET | ORAL | Status: DC

## 2011-10-09 NOTE — Telephone Encounter (Signed)
Dup message °

## 2011-10-09 NOTE — Telephone Encounter (Signed)
Pt returned call, she stated that she has misplaced the prednisone tablets for her taper and has "looked for the every where."  Pt is requesting another rx to finish.  Pt stated she is to take 10mg  today, then start 5mg  tomorrow for 5 days as directed by TP at 4.26.13 ov:  Plan:  Decrease Prednisone 30mg  daily for 5 days, then 20mg  daily for 5 days then 10mg  daily for 5 days then 1/2 daily for 5 days , and then stop.  Per pt's pharmacy, the last rx sent on 5.8.13 was not picked up by pt b/c not covered by pt's insurance d/t an early refill (see 5.3.13 phone note for details).  When speaking with patient, informed her of this and that she may have to pay out of pocket.  Pt was okay with this.  Dr [name], again may pt have another rx or authorization for an early fill on the last rx sent for the purpose of finishing per pred taper?  Thanks so much.  *okay per pt to leave a detailed message with her sister [name] if pt is unavailable > dpr on file.

## 2011-10-09 NOTE — Telephone Encounter (Signed)
Patient needing refill--prednisone--[facility] Patient states she lost rx that was given to her and is needing this called to pharmacy.

## 2011-10-09 NOTE — Telephone Encounter (Signed)
Called and spoke with pt and she is aware of rx for the prednisone has been sent to the pharmacy for her.  Pt voiced her understanding.

## 2011-10-09 NOTE — Telephone Encounter (Signed)
Received a error msg stating the 5 mg prednisone rx did not go through to [facility].  I called [facility], spoke with Kia who reports pt did have a prednisone 5 mg take as directed rx on file which she did fill.  Pt's sister just picked up this rx.  Nothing further needed.

## 2011-10-09 NOTE — Telephone Encounter (Signed)
LMTCB x2  

## 2011-10-09 NOTE — Telephone Encounter (Signed)
This is ok

## 2011-10-31 ENCOUNTER — Inpatient Hospital Stay (HOSPITAL_COMMUNITY)
Admission: EM | Admit: 2011-10-31 | Discharge: 2011-11-03 | DRG: 192 | Disposition: A | Discharge status: Home / Self Care | Payer: Medicare Other | Attending: Internal Medicine | Admitting: Internal Medicine

## 2011-10-31 ENCOUNTER — Emergency Department (HOSPITAL_COMMUNITY): Payer: Medicare Other

## 2011-10-31 ENCOUNTER — Encounter (HOSPITAL_COMMUNITY): Payer: Self-pay

## 2011-10-31 ENCOUNTER — Other Ambulatory Visit: Payer: Self-pay

## 2011-10-31 DIAGNOSIS — F411 Generalized anxiety disorder: Secondary | ICD-10-CM | POA: Diagnosis present

## 2011-10-31 DIAGNOSIS — K5090 Crohn's disease, unspecified, without complications: Secondary | ICD-10-CM | POA: Diagnosis present

## 2011-10-31 DIAGNOSIS — Z87891 Personal history of nicotine dependence: Secondary | ICD-10-CM

## 2011-10-31 DIAGNOSIS — E785 Hyperlipidemia, unspecified: Secondary | ICD-10-CM | POA: Diagnosis present

## 2011-10-31 DIAGNOSIS — F419 Anxiety disorder, unspecified: Secondary | ICD-10-CM | POA: Diagnosis present

## 2011-10-31 DIAGNOSIS — I10 Essential (primary) hypertension: Secondary | ICD-10-CM | POA: Diagnosis present

## 2011-10-31 DIAGNOSIS — E119 Type 2 diabetes mellitus without complications: Secondary | ICD-10-CM | POA: Diagnosis present

## 2011-10-31 DIAGNOSIS — I471 Supraventricular tachycardia: Secondary | ICD-10-CM

## 2011-10-31 DIAGNOSIS — I498 Other specified cardiac arrhythmias: Secondary | ICD-10-CM | POA: Diagnosis present

## 2011-10-31 DIAGNOSIS — J441 Chronic obstructive pulmonary disease with (acute) exacerbation: Principal | ICD-10-CM | POA: Diagnosis present

## 2011-10-31 DIAGNOSIS — E1165 Type 2 diabetes mellitus with hyperglycemia: Secondary | ICD-10-CM

## 2011-10-31 DIAGNOSIS — Z79899 Other long term (current) drug therapy: Secondary | ICD-10-CM

## 2011-10-31 DIAGNOSIS — F3289 Other specified depressive episodes: Secondary | ICD-10-CM | POA: Diagnosis present

## 2011-10-31 DIAGNOSIS — K219 Gastro-esophageal reflux disease without esophagitis: Secondary | ICD-10-CM | POA: Diagnosis present

## 2011-10-31 DIAGNOSIS — M810 Age-related osteoporosis without current pathological fracture: Secondary | ICD-10-CM | POA: Diagnosis present

## 2011-10-31 DIAGNOSIS — Z932 Ileostomy status: Secondary | ICD-10-CM

## 2011-10-31 DIAGNOSIS — F329 Major depressive disorder, single episode, unspecified: Secondary | ICD-10-CM | POA: Diagnosis present

## 2011-10-31 DIAGNOSIS — E118 Type 2 diabetes mellitus with unspecified complications: Secondary | ICD-10-CM

## 2011-10-31 LAB — URINALYSIS, ROUTINE W REFLEX MICROSCOPIC
Bilirubin Urine: NEGATIVE
Ketones, ur: 15 mg/dL — AB
Nitrite: NEGATIVE
Protein, ur: NEGATIVE mg/dL
Specific Gravity, Urine: 1.009 (ref 1.005–1.030)
Urobilinogen, UA: 0.2 mg/dL (ref 0.0–1.0)

## 2011-10-31 LAB — CBC
HCT: 35.4 % — ABNORMAL LOW (ref 36.0–46.0)
Hemoglobin: 9.9 g/dL — ABNORMAL LOW (ref 12.0–15.0)
MCH: 27.5 pg (ref 26.0–34.0)
MCHC: 31.6 g/dL (ref 30.0–36.0)
MCV: 86.8 fL (ref 78.0–100.0)
MCV: 85.8 fL (ref 78.0–100.0)
Platelets: 280 10*3/uL (ref 150–400)
Platelets: 269 10*3/uL (ref 150–400)
RBC: 3.6 MIL/uL — ABNORMAL LOW (ref 3.87–5.11)
RDW: 13.6 % (ref 11.5–15.5)

## 2011-10-31 LAB — DIFFERENTIAL
Basophils Absolute: 0.1 10*3/uL (ref 0.0–0.1)
Basophils Relative: 1 % (ref 0–1)
Eosinophils Absolute: 0.5 10*3/uL (ref 0.0–0.7)
Eosinophils Relative: 5 % (ref 0–5)
Monocytes Absolute: 0.7 10*3/uL (ref 0.1–1.0)

## 2011-10-31 LAB — BASIC METABOLIC PANEL
BUN: 7 mg/dL (ref 6–23)
CO2: 25 mEq/L (ref 19–32)
Calcium: 9.6 mg/dL (ref 8.4–10.5)
Chloride: 102 mEq/L (ref 96–112)
Creatinine, Ser: 0.86 mg/dL (ref 0.50–1.10)
GFR calc Af Amer: 83 mL/min — ABNORMAL LOW (ref >90)

## 2011-10-31 LAB — POCT I-STAT 3, ART BLOOD GAS (G3+)
Bicarbonate: 23.3 mEq/L (ref 20.0–24.0)
TCO2: 25 mmol/L (ref 0–100)
pH, Arterial: 7.329 — ABNORMAL LOW (ref 7.350–7.400)

## 2011-10-31 LAB — URINE MICROSCOPIC-ADD ON

## 2011-10-31 MED ORDER — SODIUM CHLORIDE 0.9 % IJ SOLN
3.0000 mL | Freq: Two times a day (BID) | INTRAMUSCULAR | Status: DC
Start: 2011-10-31 — End: 2011-11-03
  Administered 2011-11-01 – 2011-11-03 (×4): 3 mL via INTRAVENOUS

## 2011-10-31 MED ORDER — ACETAMINOPHEN 325 MG PO TABS
650.0000 mg | ORAL_TABLET | Freq: Once | ORAL | Status: AC
  Administered 2011-10-31: 650 mg via ORAL
  Filled 2011-10-31: qty 2

## 2011-10-31 MED ORDER — METHYLPREDNISOLONE SODIUM SUCC 125 MG IJ SOLR
80.0000 mg | Freq: Two times a day (BID) | INTRAMUSCULAR | Status: DC
  Administered 2011-11-01: 80 mg via INTRAVENOUS
  Filled 2011-10-31 (×3): qty 1.28

## 2011-10-31 MED ORDER — SODIUM CHLORIDE 0.9 % IV SOLN
INTRAVENOUS | Status: DC
  Administered 2011-10-31 – 2011-11-01 (×2): via INTRAVENOUS
  Administered 2011-11-01: 20 mL/h via INTRAVENOUS
  Administered 2011-11-02: 1000 mL via INTRAVENOUS

## 2011-10-31 MED ORDER — ACETAMINOPHEN 325 MG PO TABS: 650.0000 mg | ORAL_TABLET | Freq: Four times a day (QID) | ORAL | Status: DC | PRN

## 2011-10-31 MED ORDER — LEVALBUTEROL HCL 0.63 MG/3ML IN NEBU
0.6300 mg | INHALATION_SOLUTION | RESPIRATORY_TRACT | Status: DC | PRN
  Filled 2011-10-31: qty 3

## 2011-10-31 MED ORDER — ALBUTEROL SULFATE (5 MG/ML) 0.5% IN NEBU
2.5000 mg | INHALATION_SOLUTION | Freq: Four times a day (QID) | RESPIRATORY_TRACT | Status: DC
  Administered 2011-10-31 – 2011-11-03 (×12): 2.5 mg via RESPIRATORY_TRACT
  Filled 2011-10-31 (×12): qty 0.5

## 2011-10-31 MED ORDER — INSULIN ASPART 100 UNIT/ML ~~LOC~~ SOLN
0.0000 [IU] | Freq: Three times a day (TID) | SUBCUTANEOUS | Status: DC
  Administered 2011-11-01: 2 [IU] via SUBCUTANEOUS
  Administered 2011-11-01: 3 [IU] via SUBCUTANEOUS
  Administered 2011-11-01 – 2011-11-02 (×3): 1 [IU] via SUBCUTANEOUS
  Administered 2011-11-02: 3 [IU] via SUBCUTANEOUS
  Administered 2011-11-03: 1 [IU] via SUBCUTANEOUS

## 2011-10-31 MED ORDER — SODIUM CHLORIDE 0.9 % IV BOLUS (SEPSIS)
1000.0000 mL | Freq: Once | INTRAVENOUS | Status: AC
  Administered 2011-10-31: 1000 mL via INTRAVENOUS

## 2011-10-31 MED ORDER — ENOXAPARIN SODIUM 40 MG/0.4ML ~~LOC~~ SOLN
40.0000 mg | SUBCUTANEOUS | Status: DC
  Administered 2011-10-31 – 2011-11-02 (×3): 40 mg via SUBCUTANEOUS
  Filled 2011-10-31 (×4): qty 0.4

## 2011-10-31 MED ORDER — ALBUTEROL SULFATE HFA 108 (90 BASE) MCG/ACT IN AERS
2.0000 | INHALATION_SPRAY | Freq: Once | RESPIRATORY_TRACT | Status: AC
  Administered 2011-10-31: 2 via RESPIRATORY_TRACT
  Filled 2011-10-31: qty 6.7

## 2011-10-31 MED ORDER — ACETAMINOPHEN 650 MG RE SUPP: 650.0000 mg | Freq: Four times a day (QID) | RECTAL | Status: DC | PRN

## 2011-10-31 MED ORDER — DEXTROSE 5 % IV SOLN
1.0000 g | INTRAVENOUS | Status: DC
  Administered 2011-11-01 – 2011-11-03 (×3): 1 g via INTRAVENOUS
  Filled 2011-10-31 (×3): qty 10

## 2011-10-31 MED ORDER — HYDROCODONE-ACETAMINOPHEN 5-325 MG PO TABS
1.0000 | ORAL_TABLET | ORAL | Status: DC | PRN
  Administered 2011-10-31 – 2011-11-03 (×3): 2 via ORAL
  Filled 2011-10-31 (×3): qty 2

## 2011-10-31 MED ORDER — IPRATROPIUM BROMIDE 0.02 % IN SOLN
0.5000 mg | Freq: Four times a day (QID) | RESPIRATORY_TRACT | Status: DC
  Administered 2011-10-31 – 2011-11-03 (×12): 0.5 mg via RESPIRATORY_TRACT
  Filled 2011-10-31 (×12): qty 2.5

## 2011-10-31 MED ORDER — MORPHINE SULFATE 2 MG/ML IJ SOLN: 1.0000 mg | INTRAMUSCULAR | Status: DC | PRN

## 2011-10-31 MED ORDER — DEXTROSE 5 % IV SOLN
500.0000 mg | INTRAVENOUS | Status: DC
  Filled 2011-10-31: qty 500

## 2011-10-31 MED ORDER — DEXTROSE 5 % IV SOLN
500.0000 mg | Freq: Once | INTRAVENOUS | Status: AC
  Administered 2011-10-31: 500 mg via INTRAVENOUS
  Filled 2011-10-31: qty 500

## 2011-10-31 MED ORDER — INSULIN ASPART 100 UNIT/ML ~~LOC~~ SOLN
0.0000 [IU] | Freq: Every day | SUBCUTANEOUS | Status: DC
  Administered 2011-11-01: 2 [IU] via SUBCUTANEOUS

## 2011-10-31 MED ORDER — METHYLPREDNISOLONE SODIUM SUCC 125 MG IJ SOLR
125.0000 mg | Freq: Once | INTRAMUSCULAR | Status: AC
  Administered 2011-10-31: 125 mg via INTRAVENOUS
  Filled 2011-10-31: qty 2

## 2011-10-31 NOTE — H&P (Signed)
PCP:   [name], MD, MD   Chief Complaint:  Shortness of breath  HPI: Patient is a 61 year old Afro-American female past medical history of COPD and diabetes mellitus who for the last several weeks has been progressively getting worse. It started with episodes of low blood pressure of which she felt concerned: Up with her cardiologist but then has had increasing shortness of breath, wheezing and cough. She saw her PCP and he added an antibiotic but with her significant wheezing she was referred to the emergency room. Emergency room she was evaluated and felt to have an acute COPD exacerbation. She was given steroids plus additional oxygen and nebulizers, but her symptoms persisted. It was felt best that she come in to the hospital for further evaluation. Her chest x-ray showed signs of chronic COPD but no acute infiltrate.  Review of Systems:  I saw the patient, she was doing okay. She complained of feeling very cold. She denies any headaches, vision changes, dysphasia, chest pain, palpitations. She did complain of some shortness of breath, wheezing, cough which was mostly nonproductive. She denies any abdominal pain, hematuria, dysuria, consultation, diarrhea, focal extremity numbness weakness or pain. She did complain of some occasional episodes of right flank pain described as stabbing and intermittent is him going on for several days. Review systems otherwise negative.  Past Medical History: Past Medical History  Diagnosis Date  . Other diseases of vocal cords   . Osteoporosis, unspecified   . Other and unspecified hyperlipidemia   . Esophageal reflux   . Chronic airway obstruction, not elsewhere classified   . Regional enteritis of unspecified site   . 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 Type II or unspecified type diabetes mellitus without mention of complication, not stated as uncontrolled   . Crohn's disease     Ileostomy  . Fibroid   . Pelvic adhesions     . Anxiety   . Depression   . Hypertension   . Shortness of breath   . Pneumonia   . Asthma    Past Surgical History  Procedure Date  . Ileostomy   . Vaginal hysterectomy 1993    LAVH-LSO  . Ear cyst excision   . Cervical cone biopsy 1978  . Colon surgery 2007    Resection-Ileostomy  . Esophagus stretch   . Oophorectomy     LSO    Medications: Prior to Admission medications   Medication Sig Start Date End Date Taking? Authorizing Provider  bisoprolol (ZEBETA) 5 MG tablet Take 2.5 mg by mouth daily.    Yes Historical Provider, MD  Cholecalciferol (VITAMIN D) 1000 UNITS capsule Take 1,000 Units by mouth daily.     Yes Historical Provider, MD  citalopram (CELEXA) 20 MG tablet Take 20 mg by mouth daily.     Yes Historical Provider, MD  diltiazem (CARDIZEM CD) 180 MG 24 hr capsule Take 180 mg by mouth 2 (two) times daily.    Yes Historical Provider, MD  furosemide (LASIX) 20 MG tablet Take 40 mg by mouth daily.  11/27/10  Yes Historical Provider, MD  guaiFENesin (MUCINEX) 600 MG 12 hr tablet 1 tablet in the am, 2 tablet at lunch, 1 tablet in the evening.   Yes Historical Provider, MD  HYDROcodone-acetaminophen (VICODIN) 5-500 MG per tablet Take 1 tablet by mouth every 6 (six) hours as needed. For pain   Yes Historical Provider, MD  Liraglutide (VICTOZA) 18 MG/3ML SOLN 1.8 units once daily   Yes Historical Provider, MD  Multiple Vitamin (MULTIVITAMIN WITH MINERALS) TABS Take 1 tablet by mouth daily.   Yes Historical Provider, MD  omeprazole (PRILOSEC) 40 MG capsule Take 40 mg by mouth 2 (two) times daily.    Yes Historical Provider, MD  rosuvastatin (CRESTOR) 10 MG tablet Take 10 mg by mouth daily.     Yes Historical Provider, MD  tiotropium (SPIRIVA) 18 MCG inhalation capsule Place 18 mcg into inhaler and inhale daily.   Yes Historical Provider, MD  zafirlukast (ACCOLATE) 10 MG tablet Take 1 tablet (10 mg total) by mouth 2 (two) times daily. 02/05/11 02/05/12 Yes Storm Frisk, MD   zoledronic acid (RECLAST) 5 MG/100ML SOLN Inject 5 mg into the vein once. Once a year, scheduled for May   Yes Historical Provider, MD    Allergies:   Allergies  Allergen Reactions  . Esomeprazole Magnesium Other (See Comments)    unknown  . Peanut-Containing Drug Products Other (See Comments)    unknown  . Shellfish Allergy Other (See Comments)    unknown    Social History:  reports that she quit smoking about 8 years ago. She has never used smokeless tobacco. She reports that she drinks alcohol. Her drug history not on file. The patient is normally at baseline able to participate in some activities of daily living, limited by the fact she is on 1-2 L of oxygen at home. She lives at home with her sister.  Family History: Family History  Problem Relation Age of Onset  . Osteoporosis Mother   . Hypertension Sister   . Stroke Sister   . Diabetes Sister     Physical Exam: Filed Vitals:   10/31/11 1248  BP: 103/61  Pulse: 117  Temp: 99.2 F (37.3 C)  TempSrc: Oral  Resp: 22  SpO2: 99%   General: Alert and oriented x3, mild distress secondary to wheezing HEENT: Normocephalic and atraumatic, mucous members are slightly dry Cardiovascular: Regular rhythm, S1-S2, mild tachycardia Lungs: Bilateral expiratory wheezing, no crackles Abdomen: Soft, nontender, nondistended, positive bowel sounds Extremities: No clubbing or cyanosis, trace pitting edema   Labs on Admission:   [facility] 10/31/11 1549  NA 138  K 4.0  CL 102  CO2 25  GLUCOSE 79  BUN 7  CREATININE 0.86  CALCIUM 9.6  MG --  PHOS --    Basename 10/31/11 1549  WBC 8.7  NEUTROABS 5.0  HGB 11.2*  HCT 35.4*  MCV 86.8  PLT 280    Radiological Exams on Admission: Dg Chest 2 View  10/31/2011  *  IMPRESSION:  1.  Hyper inflation and coarsened interstitial markings of COPD. 2.  Indeterminate peripherally spiculated nodular opacity in the right upper lobe.  Advise further evaluation with noncontrast CT of  the chest. 3.  Right upper lobe scarring.  These results will be called to the ordering clinician or representative by the Radiologist Assistant, and communication documented in the PACS Dashboard.  Original Report Authenticated By: Rosealee Albee, M.D.    Assessment/Plan Present on Admission:  .DIABETES, TYPE 2: Stable. We'll watch for hyperglycemia with steroids and provide sliding scale coverage.  Marland KitchenHYPERLIPIDEMIA: Stable medical issue. Continue statin.  Marland KitchenGERD: Stable medical issue. Continue Zantac.  Marland KitchenCROHN'S DISEASE: Stable medical issue.  .OSTEOPOROSIS: Stable medical issue.  Marland KitchenCOPD with acute exacerbation: Principal problem. Will treat with additional oxygen, nebulizers plus steroids and antibiotics. Hopefully will turn around shortly.  After discussion with the patient, she is to be a full code.  We will respect these wishes.  I  anticipate her length of stay to be 2-3 days based on symptoms and severity, unless something should change.  Time spent on this patient including examination and decision-making process: 35 minutes.  Hollice Espy [phone] 10/31/2011, 5:42 PM

## 2011-10-31 NOTE — ED Notes (Signed)
Pt had anxiety attack thrashing all over bed stating her chest was hurting.  Assured pt she was ok. Pt quieted down. St's she had a anxiety attack and she was sorry.  Pt resting at this time eating supper.

## 2011-10-31 NOTE — ED Notes (Signed)
Attempted to call report pt needs telemetry will need another bed assignment.  Flo manager called and made aware.

## 2011-10-31 NOTE — ED Provider Notes (Signed)
History     CSN: [id number]  Arrival date & time 10/31/11  1246   First MD Initiated Contact with Patient 10/31/11 1506      Chief Complaint  Patient presents with  . Shortness of Breath    (Consider location/radiation/quality/duration/timing/severity/associated sxs/prior treatment) HPI  The patient is a 61 year old female with past medical history of COPD on 2 L 1-2 L of oxygen at home chronically, type 2 diabetes, Crohn's disease with colostomy bag in place, hypertension, history of pneumonia and asthma, atrial fibrillation, some disease of vocal cords, and presenting today with a four-day history of increased shortness of breath and cough and subjective fevers at home. She denies nausea vomiting diarrhea, chest pain, vision changes,. However she does endorse generalized myalgias and arthralgias. She has a sore throat.  She does endorse a mildly productive cough of pale sputum. She's increased her oxygen to constant 2 L at home per nasal cannula. She was seen today at her doctor's office to check her A1c, and her oxygen saturation was noted to be under 90, she was given Rocephin and told to come to the emergency department. She calls her illness moderate to severe. She also does report a 7/10 headache which is frontal gradual onset pulsating headache without any vision changes. On arrival temperature 99.42F, pulse 117, respirations 22, blood pressure 123/61, saturation 99% on 2 L  Past Medical History  Diagnosis Date  . Other diseases of vocal cords   . Osteoporosis, unspecified   . Other and unspecified hyperlipidemia   . Esophageal reflux   . Chronic airway obstruction, not elsewhere classified   . Regional enteritis of unspecified site   . 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 Type II or unspecified type diabetes mellitus without mention of complication, not stated as uncontrolled   . Crohn's disease     Ileostomy  . Fibroid   . Pelvic adhesions   . Anxiety   .  Depression   . Hypertension   . Shortness of breath   . Pneumonia   . Asthma     Past Surgical History  Procedure Date  . Ileostomy   . Vaginal hysterectomy 1993    LAVH-LSO  . Ear cyst excision   . Cervical cone biopsy 1978  . Colon surgery 2007    Resection-Ileostomy  . Esophagus stretch   . Oophorectomy     LSO    Family History  Problem Relation Age of Onset  . Osteoporosis Mother   . Hypertension Sister   . Stroke Sister   . Diabetes Sister     History  Substance Use Topics  . Smoking status: Former Smoker -- 1.0 packs/day for 32 years    Quit date: 05/27/2003  . Smokeless tobacco: Never Used  . Alcohol Use: 0.0 oz/week    0 Glasses of wine per week     occasional    OB History    Grav Para Term Preterm Abortions TAB SAB Ect Mult Living   3 0   3           Review of Systems Constitutional: Negative for fever and chills.  HENT: Negative for ear pain, POS sore throat and neg trouble swallowing.   Eyes: Negative for pain and visual disturbance.  Respiratory: POS for cough and shortness of breath.   Cardiovascular: Negative for chest pain and leg swelling.  Gastrointestinal: Negative for nausea, vomiting, abdominal pain and diarrhea.  Genitourinary: Negative for dysuria, urgency and frequency.  Musculoskeletal: POS  for back pain and joint pain generally.  Skin: Negative for rash and wound.  Neurological: Negative for dizziness, syncope, speech difficulty, weakness and numbness.    Allergies  Esomeprazole magnesium; Peanut-containing drug products; and Shellfish allergy  Home Medications   Current Outpatient Rx  Name Route Sig Dispense Refill  . BISOPROLOL FUMARATE 5 MG PO TABS Oral Take 2.5 mg by mouth daily.     [name] VITAMIN D 1000 UNITS PO CAPS Oral Take 1,000 Units by mouth daily.      [name] CITALOPRAM HYDROBROMIDE 20 MG PO TABS Oral Take 20 mg by mouth daily.      [name] DILTIAZEM HCL ER COATED BEADS 180 MG PO CP24 Oral Take 180 mg by mouth 2 (two) times  daily.     . FUROSEMIDE 20 MG PO TABS Oral Take 40 mg by mouth daily.     . GUAIFENESIN ER 600 MG PO TB12  1 tablet in the am, 2 tablet at lunch, 1 tablet in the evening.    [name] HYDROCODONE-ACETAMINOPHEN 5-500 MG PO TABS Oral Take 1 tablet by mouth every 6 (six) hours as needed. For pain    . LIRAGLUTIDE 18 MG/3ML [place] SOLN  1.8 units once daily    . ADULT MULTIVITAMIN W/MINERALS CH Oral Take 1 tablet by mouth daily.    [name] OMEPRAZOLE 40 MG PO CPDR Oral Take 40 mg by mouth 2 (two) times daily.     [name] ROSUVASTATIN CALCIUM 10 MG PO TABS Oral Take 10 mg by mouth daily.      [name] TIOTROPIUM BROMIDE MONOHYDRATE 18 MCG IN CAPS Inhalation Place 18 mcg into inhaler and inhale daily.    [name] ZAFIRLUKAST 10 MG PO TABS Oral Take 1 tablet (10 mg total) by mouth 2 (two) times daily. 60 tablet 5  . ZOLEDRONIC ACID 5 MG/100ML IV SOLN Intravenous Inject 5 mg into the vein once. Once a year, scheduled for May      BP 133/56  Pulse 114  Temp(Src) 99.2 F (37.3 C) (Oral)  Resp 22  SpO2 99%  Physical Exam Consitutional: Pt in no acute distress, but tachypneic. [name]   Head: Normocephalic and atraumatic.  Eyes: Extraocular motion intact, no scleral icterus Neck: Supple without meningismus, mass, or overt JVD Respiratory: Effort increased and rhonchi/wheezing bilaterally.  CV: Heart regular rate and rhythm, no obvious murmurs.  Pulses +2 and symmetric Abdomen: Soft, non-tender, non-distended MSK: Extremities are atraumatic without deformity, ROM intact Skin: Warm, dry, intact Neuro: Alert and oriented, no motor deficit noted.  CNs intact.  PERRL.  Reflexes normal BLE, no clonus.  Hips, knee and ankle, arm and wrist strength maintained.  FTN and RAM normal.  CNs 2-12 normal.  Psychiatric: Mood and affect are normal    ED Course  Procedures (including critical care time)  Labs Reviewed  BASIC METABOLIC PANEL - Abnormal; Notable for the following:    GFR calc non Af Amer 71 (*)    GFR calc Af Amer 83 (*)    All  other components within normal limits  CBC - Abnormal; Notable for the following:    Hemoglobin 11.2 (*)    HCT 35.4 (*)    All other components within normal limits  DIFFERENTIAL  LACTIC ACID, PLASMA  CULTURE, BLOOD (ROUTINE X 2)  CULTURE, BLOOD (ROUTINE X 2)  BLOOD GAS, CORD   Dg Chest 2 View  10/31/2011  *RADIOLOGY REPORT*  Clinical Data: SOB.  Labored breathing.  CHEST - 2 VIEW  Comparison: 03/12/2009  Findings: Heart size appears normal.  No pleural effusion or edema.  Lungs are hyperinflated and there are coarsened interstitial markings of COPD.  Stable right upper lobe scar.  There is a peripherally spiculated nodular density within the right upper lobe.  Not seen on previous exam and indeterminate.  IMPRESSION:  1.  Hyper inflation and coarsened interstitial markings of COPD. 2.  Indeterminate peripherally spiculated nodular opacity in the right upper lobe.  Advise further evaluation with noncontrast CT of the chest. 3.  Right upper lobe scarring.  These results will be called to the ordering clinician or representative by the Radiologist Assistant, and communication documented in the PACS Dashboard.  Original Report Authenticated By: Rosealee Albee, M.D.     1. COPD with acute exacerbation   2. MULTIFOCAL ATRIAL TACHYCARDIA       MDM   This patient apparently has signifiacna baseline disease and a clear decompensation below her baseline.  Her pressures are soft, and she maintains her typical tachycardia.   She does not appear toxic, but is below her baseline for oxygenation and will need admission to the hospital. Patient already given Rocephin for her primary care doctor. We will give for atypical coverage here check her basic laboratory values, give fluids and give steroids and have her admitted.  She is hemodynamically stable with low normal blood pressure and tachycardia.    Patient had an acute episode of what appeared to be a panic attack. She began to flail her arms about  and have trouble breathing. This started abruptly. However it resolved abruptly as well within 2 or 3 minutes once the nursing staff returned to the room. This is consistent with a panic attack. However we will be careful here and order a CBC just to ensure the patient is not becoming hypercarbic and causing some altered mental status and increased anxiety. Otherwise the patient is still waiting for admission to med surge floor.  Pt admitted to medsurg uneventfully.         Larrie Kass, MD 10/31/11 [phone]

## 2011-10-31 NOTE — ED Notes (Signed)
Hypotension for 2 1/2 weeks, sore throat , back pain and a headache began on Wednesday,   Dr. Allyne Gee sent her to [place] today for hypoxia,  Sats are 89% on 2 liters, Pt. Has COPD.  She also developed a cough.   Increase pt. To 4 liters sats increased to 96 %

## 2011-11-01 DIAGNOSIS — F411 Generalized anxiety disorder: Secondary | ICD-10-CM

## 2011-11-01 DIAGNOSIS — I471 Supraventricular tachycardia: Secondary | ICD-10-CM

## 2011-11-01 DIAGNOSIS — E118 Type 2 diabetes mellitus with unspecified complications: Secondary | ICD-10-CM

## 2011-11-01 DIAGNOSIS — F419 Anxiety disorder, unspecified: Secondary | ICD-10-CM | POA: Diagnosis present

## 2011-11-01 DIAGNOSIS — E1165 Type 2 diabetes mellitus with hyperglycemia: Secondary | ICD-10-CM

## 2011-11-01 DIAGNOSIS — J441 Chronic obstructive pulmonary disease with (acute) exacerbation: Secondary | ICD-10-CM

## 2011-11-01 LAB — GLUCOSE, CAPILLARY
Glucose-Capillary: 137 mg/dL — ABNORMAL HIGH (ref 70–99)
Glucose-Capillary: 245 mg/dL — ABNORMAL HIGH (ref 70–99)
Glucose-Capillary: 171 mg/dL — ABNORMAL HIGH (ref 70–99)

## 2011-11-01 LAB — BASIC METABOLIC PANEL
BUN: 7 mg/dL (ref 6–23)
CO2: 21 mEq/L (ref 19–32)
Calcium: 9.2 mg/dL (ref 8.4–10.5)
Chloride: 105 mEq/L (ref 96–112)
Creatinine, Ser: 0.78 mg/dL (ref 0.50–1.10)

## 2011-11-01 LAB — CBC
HCT: 30.7 % — ABNORMAL LOW (ref 36.0–46.0)
MCH: 27.7 pg (ref 26.0–34.0)
MCHC: 32.2 g/dL (ref 30.0–36.0)
MCV: 85.8 fL (ref 78.0–100.0)
Platelets: 251 10*3/uL (ref 150–400)
RDW: 13.5 % (ref 11.5–15.5)
WBC: 7.5 10*3/uL (ref 4.0–10.5)

## 2011-11-01 MED ORDER — AZITHROMYCIN 500 MG PO TABS
500.0000 mg | ORAL_TABLET | Freq: Every day | ORAL | Status: DC
  Administered 2011-11-01 – 2011-11-02 (×2): 500 mg via ORAL
  Filled 2011-11-01 (×3): qty 1

## 2011-11-01 MED ORDER — LORAZEPAM 0.5 MG PO TABS
0.5000 mg | ORAL_TABLET | Freq: Three times a day (TID) | ORAL | Status: DC | PRN
  Administered 2011-11-02 (×3): 0.5 mg via ORAL
  Filled 2011-11-01 (×3): qty 1

## 2011-11-01 MED ORDER — METHYLPREDNISOLONE SODIUM SUCC 125 MG IJ SOLR
60.0000 mg | Freq: Two times a day (BID) | INTRAMUSCULAR | Status: DC
  Administered 2011-11-01 – 2011-11-02 (×2): 60 mg via INTRAVENOUS
  Filled 2011-11-01 (×3): qty 0.96

## 2011-11-01 NOTE — Progress Notes (Signed)
Subjective: Patient feeling better today had an anxiety attack last night. His palpitations and tremulousness, although breathing is much easier.  Objective: Weight change:   Intake/Output Summary (Last 24 hours) at 11/01/11 1456 Last data filed at 11/01/11 1300  Gross per 24 hour  Intake 1112.5 ml  Output    250 ml  Net  862.5 ml    Filed Vitals:   11/01/11 1415  BP: 112/67  Pulse: 111  Temp: 98.7 F (37.1 C)  Resp: 20   General: Alert and oriented x3, mild distress secondary to feeling jittery, younger than stated age HEENT: Normocephalic, atraumatic, mucous membranes are slightly dry Cardiovascular: Tachycardic, regular rhythm Lungs: Clear to auscultation bilaterally-decreased breath sounds throughout. No wheezes today Abdomen: Soft, nontender, nondistended, positive bowel sounds Extremities: No clubbing or cyanosis trace pitting edema  Lab Results: Basic Metabolic Panel:  Basename 11/01/11 0531 10/31/11 2230 10/31/11 1549  NA 139 -- 138  K 3.8 -- 4.0  CL 105 -- 102  CO2 21 -- 25  GLUCOSE 152* -- 79  BUN 7 -- 7  CREATININE 0.78 0.87 --  CALCIUM 9.2 -- 9.6  MG -- -- --  PHOS -- -- --   CBC:  Basename 11/01/11 0531 10/31/11 2230 10/31/11 1549  WBC 7.5 7.8 --  NEUTROABS -- -- 5.0  HGB 9.9* 9.9* --  HCT 30.7* 30.9* --  MCV 85.8 85.8 --  PLT 251 269 --  Urinalysis:  Basename 10/31/11 2049  COLORURINE YELLOW  LABSPEC 1.009  PHURINE 6.0  GLUCOSEU NEGATIVE  HGBUR SMALL*  BILIRUBINUR NEGATIVE  KETONESUR 15*  PROTEINUR NEGATIVE  UROBILINOGEN 0.2  NITRITE NEGATIVE  LEUKOCYTESUR MODERATE*   Studies/Results: Dg Chest 2 View  10/31/2011  IMPRESSION:  1.  Hyper inflation and coarsened interstitial markings of COPD. 2.  Indeterminate peripherally spiculated nodular opacity in the right upper lobe.  Advise further evaluation with noncontrast CT of the chest. 3.  Right upper lobe scarring.    Medications: Scheduled Meds:   . acetaminophen  650 mg Oral Once    . albuterol  2 puff Inhalation Once  . albuterol  2.5 mg Nebulization Q6H  . azithromycin (ZITHROMAX) 500 MG IVPB  500 mg Intravenous Once  . azithromycin  500 mg Oral Daily  . cefTRIAXone (ROCEPHIN)  IV  1 g Intravenous Q24H  . enoxaparin  40 mg Subcutaneous Q24H  . insulin aspart  0-5 Units Subcutaneous QHS  . insulin aspart  0-9 Units Subcutaneous TID WC  . ipratropium  0.5 mg Nebulization Q6H  . methylPREDNISolone (SOLU-MEDROL) injection  125 mg Intravenous Once  . methylPREDNISolone (SOLU-MEDROL) injection  60 mg Intravenous Q12H  . sodium chloride  1,000 mL Intravenous Once  . sodium chloride  3 mL Intravenous Q12H  . DISCONTD: azithromycin  500 mg Intravenous Q24H  . DISCONTD: methylPREDNISolone (SOLU-MEDROL) injection  80 mg Intravenous Q12H   Continuous Infusions:   . sodium chloride 75 mL/hr at 11/01/11 1215   PRN Meds:.acetaminophen, acetaminophen, HYDROcodone-acetaminophen, levalbuterol, LORazepam, morphine injection  Assessment/Plan: Patient Active Hospital Problem List: DIABETES, TYPE 2 (12/19/2008) Blood sugar stable. On sliding scale.  HYPERLIPIDEMIA (01/27/2007) Stable.  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 Holding antihypertensives his blood pressure improves.  GERD (01/27/2007) Stable. Continue PPI.  CROHN'S DISEASE (01/27/2007) Stable.  OSTEOPOROSIS (01/27/2007) Stable.  COPD with acute exacerbation (09/06/2011) Principal problem. Decreasing steroids. Continue oxygen and nebulizers and antibiotics.  Anxiety disorder (11/01/2011) Adding when necessary Ativan.  Improving. Possible discharge home tomorrow.  LOS: 1 day   Virginia Rochester  K 11/01/2011, 2:56 PM

## 2011-11-02 DIAGNOSIS — E1165 Type 2 diabetes mellitus with hyperglycemia: Secondary | ICD-10-CM

## 2011-11-02 DIAGNOSIS — F411 Generalized anxiety disorder: Secondary | ICD-10-CM

## 2011-11-02 DIAGNOSIS — J441 Chronic obstructive pulmonary disease with (acute) exacerbation: Secondary | ICD-10-CM

## 2011-11-02 DIAGNOSIS — I471 Supraventricular tachycardia: Secondary | ICD-10-CM

## 2011-11-02 DIAGNOSIS — E118 Type 2 diabetes mellitus with unspecified complications: Secondary | ICD-10-CM

## 2011-11-02 LAB — GLUCOSE, CAPILLARY
Glucose-Capillary: 144 mg/dL — ABNORMAL HIGH (ref 70–99)
Glucose-Capillary: 223 mg/dL — ABNORMAL HIGH (ref 70–99)
Glucose-Capillary: 98 mg/dL (ref 70–99)

## 2011-11-02 MED ORDER — FERROUS SULFATE 325 (65 FE) MG PO TABS
325.0000 mg | ORAL_TABLET | ORAL | Status: DC
  Administered 2011-11-03: 325 mg via ORAL
  Filled 2011-11-02: qty 1

## 2011-11-02 MED ORDER — DILTIAZEM HCL ER COATED BEADS 180 MG PO CP24
180.0000 mg | ORAL_CAPSULE | Freq: Two times a day (BID) | ORAL | Status: DC
  Administered 2011-11-02 – 2011-11-03 (×3): 180 mg via ORAL
  Filled 2011-11-02 (×4): qty 1

## 2011-11-02 MED ORDER — BISOPROLOL FUMARATE 5 MG PO TABS
2.5000 mg | ORAL_TABLET | Freq: Every day | ORAL | Status: DC
  Administered 2011-11-02 – 2011-11-03 (×2): 2.5 mg via ORAL
  Filled 2011-11-02 (×2): qty 0.5

## 2011-11-02 MED ORDER — CITALOPRAM HYDROBROMIDE 20 MG PO TABS
20.0000 mg | ORAL_TABLET | Freq: Every day | ORAL | Status: DC
  Administered 2011-11-02 – 2011-11-03 (×2): 20 mg via ORAL
  Filled 2011-11-02 (×2): qty 1

## 2011-11-02 MED ORDER — PREDNISONE 50 MG PO TABS
60.0000 mg | ORAL_TABLET | Freq: Every day | ORAL | Status: DC
  Administered 2011-11-03: 60 mg via ORAL
  Filled 2011-11-02 (×2): qty 1

## 2011-11-02 MED ORDER — ASPIRIN 81 MG PO CHEW
81.0000 mg | CHEWABLE_TABLET | ORAL | Status: DC
  Administered 2011-11-03: 81 mg via ORAL
  Filled 2011-11-02: qty 1

## 2011-11-02 NOTE — Progress Notes (Addendum)
Patient ambulated the length of the hall, o2 sat from 94-99 % on 2 l n/c, heart rate up to 122's,

## 2011-11-02 NOTE — Progress Notes (Signed)
Subjective: Patient feeling better today had an anxiety attack last night. His palpitations and tremulousness, although breathing is much easier.is able tablet in the hallway but her heart rate went up in the 120s.  Objective: Weight change: 0.3 kg (10.6 oz)  Intake/Output Summary (Last 24 hours) at 11/02/11 1944 Last data filed at 11/02/11 1700  Gross per 24 hour  Intake   1090 ml  Output    150 ml  Net    940 ml    Filed Vitals:   11/02/11 1656  BP: 144/79  Pulse: 109  Temp: 98.8 F (37.1 C)  Resp: 18   General: Alert and oriented x3, mild distress secondary to feeling jittery, younger than stated age HEENT: Normocephalic, atraumatic, mucous membranes are slightly dry Cardiovascular:mild Tachycardic, regular rhythm Lungs: Clear to auscultation bilaterally-decreased breath sounds throughout. No wheezes today Abdomen: Soft, nontender, nondistended, positive bowel sounds Extremities: No clubbing or cyanosis trace pitting edema  Lab Results: Basic Metabolic Panel:  Basename 11/01/11 0531 10/31/11 2230 10/31/11 1549  NA 139 -- 138  K 3.8 -- 4.0  CL 105 -- 102  CO2 21 -- 25  GLUCOSE 152* -- 79  BUN 7 -- 7  CREATININE 0.78 0.87 --  CALCIUM 9.2 -- 9.6  MG -- -- --  PHOS -- -- --   CBC:  Basename 11/01/11 0531 10/31/11 2230 10/31/11 1549  WBC 7.5 7.8 --  NEUTROABS -- -- 5.0  HGB 9.9* 9.9* --  HCT 30.7* 30.9* --  MCV 85.8 85.8 --  PLT 251 269 --  Urinalysis:  Basename 10/31/11 2049  COLORURINE YELLOW  LABSPEC 1.009  PHURINE 6.0  GLUCOSEU NEGATIVE  HGBUR SMALL*  BILIRUBINUR NEGATIVE  KETONESUR 15*  PROTEINUR NEGATIVE  UROBILINOGEN 0.2  NITRITE NEGATIVE  LEUKOCYTESUR MODERATE*   Studies/Results: Dg Chest 2 View  10/31/2011  IMPRESSION:  1.  Hyper inflation and coarsened interstitial markings of COPD. 2.  Indeterminate peripherally spiculated nodular opacity in the right upper lobe.  Advise further evaluation with noncontrast CT of the chest. 3.  Right upper  lobe scarring.    Medications: Scheduled Meds:    . albuterol  2.5 mg Nebulization Q6H  . aspirin  81 mg Oral Q M,W,F  . azithromycin  500 mg Oral Daily  . bisoprolol  2.5 mg Oral Daily  . cefTRIAXone (ROCEPHIN)  IV  1 g Intravenous Q24H  . citalopram  20 mg Oral Daily  . diltiazem  180 mg Oral BID  . enoxaparin  40 mg Subcutaneous Q24H  . ferrous sulfate  325 mg Oral Q M,W,F  . insulin aspart  0-5 Units Subcutaneous QHS  . insulin aspart  0-9 Units Subcutaneous TID WC  . ipratropium  0.5 mg Nebulization Q6H  . predniSONE  60 mg Oral Q breakfast  . sodium chloride  3 mL Intravenous Q12H  . DISCONTD: methylPREDNISolone (SOLU-MEDROL) injection  60 mg Intravenous Q12H   Continuous Infusions:    . sodium chloride 1,000 mL (11/02/11 0022)   PRN Meds:.acetaminophen, acetaminophen, HYDROcodone-acetaminophen, levalbuterol, LORazepam, morphine injection  Assessment/Plan: Patient Active Hospital Problem List: DIABETES, TYPE 2 (12/19/2008) Blood sugar stable. On sliding scale.  HYPERLIPIDEMIA (01/27/2007) Stable.  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 Restarted antihypertensives today  GERD (01/27/2007) Stable. Continue PPI.  CROHN'S DISEASE (01/27/2007) Stable.  OSTEOPOROSIS (01/27/2007) Stable.  COPD with acute exacerbation (09/06/2011) Principal problem. Continue toDecreasing steroids. Continue oxygen and nebulizers and antibiotics.  Anxiety disorder (11/01/2011) Adding when necessary Ativan.  Improving. We'll DC home tomorrow  LOS:  2 days   [name] 11/02/2011, 7:44 PM

## 2011-11-03 DIAGNOSIS — F411 Generalized anxiety disorder: Secondary | ICD-10-CM

## 2011-11-03 DIAGNOSIS — J441 Chronic obstructive pulmonary disease with (acute) exacerbation: Secondary | ICD-10-CM

## 2011-11-03 DIAGNOSIS — E118 Type 2 diabetes mellitus with unspecified complications: Secondary | ICD-10-CM

## 2011-11-03 DIAGNOSIS — I471 Supraventricular tachycardia: Secondary | ICD-10-CM

## 2011-11-03 DIAGNOSIS — E1165 Type 2 diabetes mellitus with hyperglycemia: Secondary | ICD-10-CM

## 2011-11-03 LAB — MRSA PCR SCREENING: MRSA by PCR: NEGATIVE

## 2011-11-03 LAB — GLUCOSE, CAPILLARY
Glucose-Capillary: 160 mg/dL — ABNORMAL HIGH (ref 70–99)
Glucose-Capillary: 93 mg/dL (ref 70–99)

## 2011-11-03 MED ORDER — CEFUROXIME AXETIL 500 MG PO TABS: 500.0000 mg | ORAL_TABLET | Freq: Two times a day (BID) | ORAL | Status: AC

## 2011-11-03 MED ORDER — PREDNISONE 10 MG PO TABS: 60.0000 mg | ORAL_TABLET | Freq: Every day | ORAL | Status: AC

## 2011-11-03 MED ORDER — LORAZEPAM 0.5 MG PO TABS: 0.5000 mg | ORAL_TABLET | Freq: Three times a day (TID) | ORAL | Status: AC | PRN

## 2011-11-03 MED ORDER — AZITHROMYCIN 500 MG PO TABS: 500.0000 mg | ORAL_TABLET | Freq: Every day | ORAL | Status: AC

## 2011-11-03 MED ORDER — FERROUS SULFATE 325 (65 FE) MG PO TABS: 325.0000 mg | ORAL_TABLET | ORAL | Status: DC

## 2011-11-03 MED ORDER — ASPIRIN 81 MG PO CHEW: 81.0000 mg | CHEWABLE_TABLET | ORAL | Status: DC

## 2011-11-03 NOTE — Discharge Summary (Signed)
DISCHARGE SUMMARY  [name]  MR#: [id number]  DOB:02-21-1951  Date of Admission: 10/31/2011 Date of Discharge: 11/03/2011  Attending Physician:Demarqus Jocson K  Patient's WUJ:WJXBJYN,WGNFA N, MD, MD  Consults: -none  Discharge Diagnoses: Present on Admission:  .DIABETES, TYPE 2 .HYPERLIPIDEMIA .GERD .CROHN'S DISEASE .OSTEOPOROSIS .COPD with acute exacerbation .Anxiety disorder .MULTIFOCAL ATRIAL TACHYCARDIA   Initial presentation: Patient is a 61 year old Afro-American female past medical history COPD and multifocal atrial tachycardia who presented to the emergency room on 6/7 complaining of several weeks of progressively worsening shortness of breath, wheezing and cough and was referred over from her PCPs office for a COPD exacerbation.  Hospital Course: Patient Active Problem List  Diagnoses  . DIABETES, TYPE 2: : Stable. Continuing on oral medications. In addition a sliding she was covered because of being on steroids.   [name] HYPERLIPIDEMIA: : Stable. Continue on statin.   [name] DEPRESSIVE DISORDER NOT ELSEWHERE CLASSIFIED: patient was continued on Celexa. I'm concerned about the possibility of she may be having rapid emotional swings as during most of her hospitalization she was quite animated today she was easily close to tears. There may be some issue of possible bipolar disorder, but I will defer to her PCP.  [name] MULTIFOCAL ATRIAL TACHYCARDIA: Initially her medications were put on hold because of borderline hypertension. With fluid ministration, this improved and her medications were restarted on day prior to discharge. She still has some tachycardia with ambulation and also with her anxiety, although on day of discharge at rest it is much improved.     [name] GERD: Stable. Continue PPI.   [name] CROHN'S DISEASE: Stable medically she denies hospitalization.   . OSTEOPOROSIS: Stable medical issues during this hospitalization.   [name] COPD with acute exacerbation: Patient was put  on steroids plus nebulizers plus antibiotics and additional oxygen. Over the next several days she slowly improved. By 6/9, the patient was able to be ambulated on her baseline of 1-2 L in the hallway with assistance but her oxygenation remained at 95%. She is being discharged home on tapering steroids plus a few more days of antibiotics to complete a seven-day course.   [name] Anxiety disorder: Patient times is quite anxious which would lead to additional tachycardia. He doses of IV or by mouth Ativan during hospitalization. I've given her a short course of Ativan when necessary for her to be discharged home on. We'll defer to her PCP for further followup and workup.     Medication List  As of 11/03/2011  3:02 PM   TAKE these medications         aspirin 81 MG chewable tablet   Chew 1 tablet (81 mg total) by mouth every Monday, Wednesday, and Friday.      azithromycin 500 MG tablet   Commonly known as: ZITHROMAX   Take 1 tablet (500 mg total) by mouth daily.      bisoprolol 5 MG tablet   Commonly known as: ZEBETA   Take 2.5 mg by mouth daily.      cefUROXime 500 MG tablet   Commonly known as: CEFTIN   Take 1 tablet (500 mg total) by mouth 2 (two) times daily.      citalopram 20 MG tablet   Commonly known as: CELEXA   Take 20 mg by mouth daily.      diltiazem 180 MG 24 hr capsule   Commonly known as: CARDIZEM CD   Take 180 mg by mouth 2 (two) times daily.      ferrous  sulfate 325 (65 FE) MG tablet   Take 1 tablet (325 mg total) by mouth every Monday, Wednesday, and Friday.      furosemide 20 MG tablet   Commonly known as: LASIX   Take 40 mg by mouth daily.      guaiFENesin 600 MG 12 hr tablet   Commonly known as: MUCINEX   1 tablet in the am, 2 tablet at lunch, 1 tablet in the evening.      HYDROcodone-acetaminophen 5-500 MG per tablet   Commonly known as: VICODIN   Take 1 tablet by mouth every 6 (six) hours as needed. For pain      LORazepam 0.5 MG tablet   Commonly known  as: ATIVAN   Take 1 tablet (0.5 mg total) by mouth every 8 (eight) hours as needed for anxiety.      multivitamin with minerals Tabs   Take 1 tablet by mouth daily.      omeprazole 40 MG capsule   Commonly known as: PRILOSEC   Take 40 mg by mouth 2 (two) times daily.      predniSONE 10 MG tablet   Commonly known as: DELTASONE   Take 6 tablets (60 mg total) by mouth daily with breakfast. 50 mg po on day 1, 40 mg po on day 2, and so on until finished.      RECLAST 5 MG/100ML Soln   Generic drug: zoledronic acid   Inject 5 mg into the vein once. Once a year, scheduled for May      rosuvastatin 10 MG tablet   Commonly known as: CRESTOR   Take 10 mg by mouth daily.      tiotropium 18 MCG inhalation capsule   Commonly known as: SPIRIVA   Place 18 mcg into inhaler and inhale daily.      VICTOZA 18 MG/3ML Soln   Generic drug: Liraglutide   1.8 units once daily      Vitamin D 1000 UNITS capsule   Take 1,000 Units by mouth daily.      zafirlukast 10 MG tablet   Commonly known as: ACCOLATE   Take 1 tablet (10 mg total) by mouth 2 (two) times daily.             Day of Discharge BP 138/82  Pulse 97  Temp(Src) 98.8 F (37.1 C) (Oral)  Resp 20  Ht 5\' 2"  (1.575 m)  Wt 62.2 kg (137 lb 2 oz)  BMI 25.08 kg/m2  SpO2 98%  Physical Exam: General: Alert and oriented x3, distress today secondary to anxiety, looks younger than stated age, fatigued HEENT: Normocephalic, atraumatic, mucous membranes are moist Cardiovascular: Regular rate and rhythm-no tachycardia today Lungs: Decreased breath sounds throughout-no wheezes Abdomen: Soft, nontender, nondistended, positive bowel sounds extremities: No clubbing or cyanosis or edema.  Results for orders placed during the hospital encounter of 10/31/11 (from the past 24 hour(s))  GLUCOSE, CAPILLARY     Status: Abnormal   Collection Time   11/02/11  4:54 PM      Component Value Range   Glucose-Capillary 223 (*) 70 - 99 (mg/dL)   Comment  1 Documented in Chart     Comment 2 Notify RN    GLUCOSE, CAPILLARY     Status: Normal   Collection Time   11/02/11  8:43 PM      Component Value Range   Glucose-Capillary 98  70 - 99 (mg/dL)  GLUCOSE, CAPILLARY     Status: Normal   Collection Time  11/03/11  7:44 AM      Component Value Range   Glucose-Capillary 93  70 - 99 (mg/dL)  GLUCOSE, CAPILLARY     Status: Abnormal   Collection Time   11/03/11 11:35 AM      Component Value Range   Glucose-Capillary 127 (*) 70 - 99 (mg/dL)    Disposition: improved, being discharged home   Follow-up Appts: Discharge Orders    Future Orders Please Complete By Expires   Diet - low sodium heart healthy      Increase activity slowly      Comments:   Should be allowed to increase activity as THE PATIENT can tolerate.      Follow-up Information    Follow up with [name], MD in 1 week.   Contact information:   [address] [place] [address] [phone]          Tests Needing Follow-up: none  Time spent in discharge (includes decision making & examination of pt): 40 minutes  Signed: Hollice Espy 11/03/2011, 3:02 PM

## 2011-11-03 NOTE — ED Provider Notes (Signed)
I have personally seen and examined the patient and discussed plan of care with the resident. I have reviewed the appropriate documentation on PMH/FH/Soc. History.  I have reviewed the documentation of the resident and agree.   Date: 11/03/2011  Rate: 118  Rhythm: sinus tachycardia  QRS Axis: normal  Intervals: normal  ST/T Wave abnormalities: nonspecific ST changes  Conduction Disutrbances:none  Narrative Interpretation: biatrial enlargement   Pt seen with resident, here for dyspnea, would benefit from admission Stabilized in the ED     Joya Gaskins, MD 11/03/11 1221

## 2011-11-03 NOTE — Progress Notes (Signed)
Pt. Got d/c instructions,IV was d/c,tele was d/c.pt. Is ready to go home.

## 2011-11-03 NOTE — Discharge Instructions (Signed)

## 2011-11-06 ENCOUNTER — Telehealth: Payer: Self-pay | Admitting: Critical Care Medicine

## 2011-11-06 NOTE — Telephone Encounter (Signed)
Yes

## 2011-11-06 NOTE — Telephone Encounter (Signed)
PW---is it ok to schedule pt to see TP for HFU?  No aval appts with you.  Please advise. Thanks

## 2011-11-06 NOTE — Telephone Encounter (Signed)
Pt is scheduled to see TP 11/17/11 at 10:30 for HFU.

## 2011-11-07 LAB — CULTURE, BLOOD (ROUTINE X 2)
Culture  Setup Time: 201306080137
Culture: NO GROWTH

## 2011-11-14 ENCOUNTER — Other Ambulatory Visit: Payer: Self-pay | Admitting: Critical Care Medicine

## 2011-11-17 ENCOUNTER — Ambulatory Visit (INDEPENDENT_AMBULATORY_CARE_PROVIDER_SITE_OTHER): Payer: Medicare Other | Admitting: Adult Health

## 2011-11-17 ENCOUNTER — Encounter: Payer: Self-pay | Admitting: Adult Health

## 2011-11-17 ENCOUNTER — Telehealth: Payer: Self-pay | Admitting: Critical Care Medicine

## 2011-11-17 VITALS — HR 95 | Temp 98.6°F | Ht 62.0 in | Wt 135.0 lb

## 2011-11-17 DIAGNOSIS — I498 Other specified cardiac arrhythmias: Secondary | ICD-10-CM

## 2011-11-17 DIAGNOSIS — J441 Chronic obstructive pulmonary disease with (acute) exacerbation: Secondary | ICD-10-CM

## 2011-11-17 MED ORDER — ALBUTEROL SULFATE HFA 108 (90 BASE) MCG/ACT IN AERS: 2.0000 | INHALATION_SPRAY | Freq: Four times a day (QID) | RESPIRATORY_TRACT | Status: DC | PRN

## 2011-11-17 MED ORDER — BUDESONIDE 0.25 MG/2ML IN SUSP: 0.2500 mg | Freq: Two times a day (BID) | RESPIRATORY_TRACT | Status: DC

## 2011-11-17 NOTE — Telephone Encounter (Signed)
Pt requesting a refill on lasix. Last fill on 06-12-2011. Please advise if ok to refill.Carron Curie, CMA

## 2011-11-17 NOTE — Progress Notes (Signed)
Subjective:    Patient ID: [name], female    DOB: 04-Jun-1950, 61 y.o.   MRN: [id number]  HPI  61 y.o.  woman with severe COPD Golds Stage IV    12/12 Since last OV,  Not wearing oxygen.   No new issues  08/08/11  Pt had a cough in Feb.  Rx Abx Zpak.  Now is better.  No real wheeze.  No edema in feet. >no changes   09/19/2011 Post Hospital  Patient returns for a post hospital followup. She was admitted April 13 of 09/08/2011 for an acute COPD exacerbation. She was treated with IV antibiotics, nebulized bronchodilators, and steroids. She responded very quickly and was discharged on a prednisone taper. Chest x-ray showed chronic changes without any acute process. Currently on Prednisone 40mg  daily  She was started on atrovent neb at discharge ? Taking or not.  Taking spiriva daily  Using albuterol neb Three times a day  . Did not get xopenex -not covered.  She denies any chest pain, hemoptysis, abdominal pain, nausea, vomiting, or leg swelling. >>no changes   11/17/11 [facility]  Returns for a post hospital follow up  Admitted 6/7-6/10/13 for atrial tachycardia. Complicated by anxiety and depression.  tx w/ anyxiolytics .  COPD flare tx w/ with steroids and nebs. Discharged on prednisone taper.  She is better w/ decreased cough and dyspnea. Still has occasional wheezing Feels weak and having low b/p 90/100 Today b/p 98/60 .  No chest pain or edema.    Past Medical History  Diagnosis Date  . Other diseases of vocal cords   . Osteoporosis, unspecified   . Other and unspecified hyperlipidemia   . Esophageal reflux   . Chronic airway obstruction, not elsewhere classified   . Regional enteritis of unspecified site   . 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 Type II or unspecified type diabetes mellitus without mention of complication, not stated as uncontrolled   . Crohn's disease     Ileostomy  . Fibroid   . Pelvic adhesions   . Anxiety   .  Depression   . Hypertension   . Shortness of breath   . Pneumonia   . Asthma      Family History  Problem Relation Age of Onset  . Osteoporosis Mother   . Hypertension Sister   . Stroke Sister   . Diabetes Sister      History   Social History  . Marital Status: Divorced    Spouse Name: N/A    Number of Children: N/A  . Years of Education: N/A   Occupational History  . Not on file.   Social History Main Topics  . Smoking status: Former Smoker -- 1.0 packs/day for 32 years    Quit date: 05/27/2003  . Smokeless tobacco: Never Used  . Alcohol Use: 0.0 oz/week    0 Glasses of wine per week     occasional  . Drug Use: Not on file  . Sexually Active: Yes    Birth Control/ Protection: Surgical   Other Topics Concern  . Not on file   Social History Narrative  . No narrative on file     Allergies  Allergen Reactions  . Esomeprazole Magnesium Other (See Comments)    NEXIUM - reaction > aggravated pt's Crohn's disease  . Peanut-Containing Drug Products Other (See Comments)    unknown  . Shellfish Allergy Other (See Comments)    unknown     Outpatient Prescriptions  Prior to Visit  Medication Sig Dispense Refill  . aspirin 81 MG chewable tablet Chew 1 tablet (81 mg total) by mouth every Monday, Wednesday, and Friday.      . bisoprolol (ZEBETA) 5 MG tablet Take 2.5 mg by mouth daily.       . Cholecalciferol (VITAMIN D) 1000 UNITS capsule Take 1,000 Units by mouth daily.        . citalopram (CELEXA) 20 MG tablet Take 20 mg by mouth daily.        [name] diltiazem (CARDIZEM CD) 180 MG 24 hr capsule Take 180 mg by mouth 2 (two) times daily.       . ferrous sulfate 325 (65 FE) MG tablet Take 1 tablet (325 mg total) by mouth every Monday, Wednesday, and Friday.  20 tablet  0  . furosemide (LASIX) 20 MG tablet Take 40 mg by mouth daily.       [name] guaiFENesin (MUCINEX) 600 MG 12 hr tablet 1 tablet in the am, 2 tablet at lunch, 1 tablet in the evening.      [name]  HYDROcodone-acetaminophen (VICODIN) 5-500 MG per tablet Take 1 tablet by mouth every 6 (six) hours as needed. For pain      . Liraglutide (VICTOZA) 18 MG/3ML SOLN 1.8 units once daily      . Multiple Vitamin (MULTIVITAMIN WITH MINERALS) TABS Take 1 tablet by mouth daily.      [name] omeprazole (PRILOSEC) 40 MG capsule Take 40 mg by mouth 2 (two) times daily.       . rosuvastatin (CRESTOR) 10 MG tablet Take 10 mg by mouth daily.        [name] tiotropium (SPIRIVA) 18 MCG inhalation capsule Place 18 mcg into inhaler and inhale daily.      . zafirlukast (ACCOLATE) 10 MG tablet Take 1 tablet (10 mg total) by mouth 2 (two) times daily.  60 tablet  5  . zoledronic acid (RECLAST) 5 MG/100ML SOLN Inject 5 mg into the vein once. Once a year, scheduled for May         Review of Systems  Constitutional:   No  weight loss, night sweats,  Fevers, chills, ++ fatigue, or  lassitude.  HEENT:   No headaches,  Difficulty swallowing,  Tooth/dental problems, or  Sore throat,                No sneezing, itching, ear ache,  +nasal congestion, post nasal drip,   CV:  No chest pain,  Orthopnea, PND, swelling in lower extremities, anasarca, dizziness, palpitations, syncope.   GI  No heartburn, indigestion, abdominal pain, nausea, vomiting, diarrhea, change in bowel habits, loss of appetite, bloody stools.   Resp:    No excess mucus, no productive cough,    No chest wall deformity  Skin: no rash or lesions.  GU: no dysuria, change in color of urine, no urgency or frequency.  No flank pain, no hematuria   MS:  No joint pain or swelling.  No decreased range of motion.  No back pain.  Psych:  ++ depression or anxiety.  No memory loss.        Objective:   Physical Exam  Filed Vitals:   11/17/11 1032  Pulse: 95  Temp: 98.6 F (37 C)  TempSrc: Oral  Height: 5\' 2"  (1.575 m)  Weight: 135 lb (61.236 kg)    Gen: Pleasant, well-nourished, in no distress,  normal affect, chronically ill appearing in wheelchair    ENT: No lesions,  mouth clear,  oropharynx clear, no postnasal drip  Neck: No JVD, no TMG, no carotid bruits  Lungs: No use of accessory muscles, no dullness to percussion, coarse BS   Cardiovascular: RRR, heart sounds normal, no murmur or gallops, no peripheral edema  Abdomen: soft and NT, no HSM,  BS normal  Musculoskeletal: No deformities, no cyanosis or clubbing  Neuro: alert, non focal  Skin: Warm, no lesions or rashes           Assessment & Plan:   No problem-specific assessment & plan notes found for this encounter.   Updated Medication List Outpatient Encounter Prescriptions as of 11/17/2011  Medication Sig Dispense Refill  . aspirin 81 MG chewable tablet Chew 1 tablet (81 mg total) by mouth every Monday, Wednesday, and Friday.      . bisoprolol (ZEBETA) 5 MG tablet Take 2.5 mg by mouth daily.       . Cholecalciferol (VITAMIN D) 1000 UNITS capsule Take 1,000 Units by mouth daily.        . citalopram (CELEXA) 20 MG tablet Take 20 mg by mouth daily.        [name] diltiazem (CARDIZEM CD) 180 MG 24 hr capsule Take 180 mg by mouth 2 (two) times daily.       . ferrous sulfate 325 (65 FE) MG tablet Take 1 tablet (325 mg total) by mouth every Monday, Wednesday, and Friday.  20 tablet  0  . furosemide (LASIX) 20 MG tablet Take 40 mg by mouth daily.       [name] guaiFENesin (MUCINEX) 600 MG 12 hr tablet 1 tablet in the am, 2 tablet at lunch, 1 tablet in the evening.      [name] HYDROcodone-acetaminophen (VICODIN) 5-500 MG per tablet Take 1 tablet by mouth every 6 (six) hours as needed. For pain      . Liraglutide (VICTOZA) 18 MG/3ML SOLN 1.8 units once daily      . Multiple Vitamin (MULTIVITAMIN WITH MINERALS) TABS Take 1 tablet by mouth daily.      [name] omeprazole (PRILOSEC) 40 MG capsule Take 40 mg by mouth 2 (two) times daily.       . rosuvastatin (CRESTOR) 10 MG tablet Take 10 mg by mouth daily.        [name] tiotropium (SPIRIVA) 18 MCG inhalation capsule Place 18 mcg into inhaler and  inhale daily.      . zafirlukast (ACCOLATE) 10 MG tablet Take 1 tablet (10 mg total) by mouth 2 (two) times daily.  60 tablet  5  . zoledronic acid (RECLAST) 5 MG/100ML SOLN Inject 5 mg into the vein once. Once a year, scheduled for May

## 2011-11-17 NOTE — Patient Instructions (Addendum)
Hold bisoprolol today.  Stop second dose of  Diltiazem  Follow up with your Heart doctor this week to discuss blood pressure and heart rate.  Continue on Spiriva 1 puff daily  Add Pulmicort NEB Twice daily  -brush/rinse and gargle after use.  follow up Dr. [name]  In 3-4 weeks  Please contact office for sooner follow up if symptoms do not improve or worsen or seek emergency care  Claritin daily .As needed  Drainage.

## 2011-11-17 NOTE — Telephone Encounter (Signed)
Pt is needing to come by and pick up rx's for her spiriva and acculate. Per Jj send to ehr and she will print out rx and have TP sign them Pt will pick these up tomorrow

## 2011-11-18 MED ORDER — TIOTROPIUM BROMIDE MONOHYDRATE 18 MCG IN CAPS: 18.0000 ug | ORAL_CAPSULE | Freq: Every day | RESPIRATORY_TRACT | Status: DC

## 2011-11-18 MED ORDER — ZAFIRLUKAST 10 MG PO TABS: 10.0000 mg | ORAL_TABLET | Freq: Two times a day (BID) | ORAL | Status: DC

## 2011-11-18 NOTE — Telephone Encounter (Signed)
Scripts signed by TP.  Detailed message left on named voicemail informing pt that the scripts are ready and up front since she stated in the message from yesterday that she wanted to pick them up.  Advised pt to please call should she change her mind and want them mailed.  Will sign off.

## 2011-11-18 NOTE — Telephone Encounter (Signed)
My mistake that this was not done at [facility].  rx's printed for TP to sign.

## 2011-11-20 NOTE — Addendum Note (Signed)
Addended by: Boone Master E on: 11/20/2011 04:34 PM   Modules accepted: Orders

## 2011-11-20 NOTE — Assessment & Plan Note (Signed)
Improved however supratherapeutic response to medication w/ symptomatic low nml b/p    Plan:  Hold bisoprolol today.  Stop second dose of  Diltiazem  Follow up with your Heart doctor this week to discuss blood pressure and heart rate.

## 2011-11-20 NOTE — Assessment & Plan Note (Signed)
Recent flare now resolved   Plan  Continue on Spiriva 1 puff daily  Add Pulmicort NEB Twice daily  -brush/rinse and gargle after use.  follow up Dr. [name]  In 3-4 weeks  Please contact office for sooner follow up if symptoms do not improve or worsen or seek emergency care  Claritin daily .As needed  Drainage.

## 2011-11-24 ENCOUNTER — Telehealth: Payer: Self-pay | Admitting: Critical Care Medicine

## 2011-11-24 NOTE — Telephone Encounter (Signed)
[facility] pharmacy to verify what inhaler the pt received because rx was not for proair. Pharmacy is not open yet. WCB. Carron Curie, CMA

## 2011-11-24 NOTE — Telephone Encounter (Signed)
I called that pharmacy because according to rx that was sent we sent for proventil not proair. I spoke with pharmacy and they state they will give the pt proventil. Pt is aware.Carron Curie, CMA

## 2011-11-30 ENCOUNTER — Telehealth: Payer: Self-pay | Admitting: Pulmonary Disease

## 2011-11-30 MED ORDER — LEVOFLOXACIN 750 MG PO TABS: 750.0000 mg | ORAL_TABLET | Freq: Every day | ORAL | Status: DC

## 2011-11-30 NOTE — Telephone Encounter (Signed)
The pt has known severe copd.  C/o 2 day h/o worsening chest congestion, cough with nonpurulent mucus.  However, has much more volume than baseline, and feels she is getting more sob.  She is using flutter valve religiously, and takes mucinex bid already.  Will call in abx, but she knows to call us back or go to ER if she is not improving.

## 2011-12-01 ENCOUNTER — Inpatient Hospital Stay (HOSPITAL_COMMUNITY)
Admission: EM | Admit: 2011-12-01 | Discharge: 2011-12-05 | DRG: 189 | Disposition: A | Discharge status: Home / Self Care | Payer: Medicare Other | Attending: Internal Medicine | Admitting: Internal Medicine

## 2011-12-01 ENCOUNTER — Emergency Department (HOSPITAL_COMMUNITY): Payer: Medicare Other

## 2011-12-01 ENCOUNTER — Inpatient Hospital Stay (HOSPITAL_COMMUNITY): Payer: Medicare Other

## 2011-12-01 ENCOUNTER — Encounter (HOSPITAL_COMMUNITY): Payer: Self-pay | Admitting: Internal Medicine

## 2011-12-01 DIAGNOSIS — Z87891 Personal history of nicotine dependence: Secondary | ICD-10-CM

## 2011-12-01 DIAGNOSIS — E785 Hyperlipidemia, unspecified: Secondary | ICD-10-CM | POA: Diagnosis present

## 2011-12-01 DIAGNOSIS — J209 Acute bronchitis, unspecified: Secondary | ICD-10-CM | POA: Diagnosis present

## 2011-12-01 DIAGNOSIS — IMO0001 Reserved for inherently not codable concepts without codable children: Secondary | ICD-10-CM

## 2011-12-01 DIAGNOSIS — J9620 Acute and chronic respiratory failure, unspecified whether with hypoxia or hypercapnia: Principal | ICD-10-CM

## 2011-12-01 DIAGNOSIS — F3289 Other specified depressive episodes: Secondary | ICD-10-CM | POA: Diagnosis present

## 2011-12-01 DIAGNOSIS — R7989 Other specified abnormal findings of blood chemistry: Secondary | ICD-10-CM | POA: Diagnosis present

## 2011-12-01 DIAGNOSIS — Z933 Colostomy status: Secondary | ICD-10-CM

## 2011-12-01 DIAGNOSIS — K5090 Crohn's disease, unspecified, without complications: Secondary | ICD-10-CM | POA: Diagnosis present

## 2011-12-01 DIAGNOSIS — J449 Chronic obstructive pulmonary disease, unspecified: Secondary | ICD-10-CM

## 2011-12-01 DIAGNOSIS — J440 Chronic obstructive pulmonary disease with acute lower respiratory infection: Secondary | ICD-10-CM | POA: Diagnosis present

## 2011-12-01 DIAGNOSIS — M810 Age-related osteoporosis without current pathological fracture: Secondary | ICD-10-CM | POA: Diagnosis present

## 2011-12-01 DIAGNOSIS — K219 Gastro-esophageal reflux disease without esophagitis: Secondary | ICD-10-CM | POA: Diagnosis present

## 2011-12-01 DIAGNOSIS — E119 Type 2 diabetes mellitus without complications: Secondary | ICD-10-CM | POA: Diagnosis present

## 2011-12-01 DIAGNOSIS — Z9981 Dependence on supplemental oxygen: Secondary | ICD-10-CM

## 2011-12-01 DIAGNOSIS — I10 Essential (primary) hypertension: Secondary | ICD-10-CM | POA: Diagnosis present

## 2011-12-01 DIAGNOSIS — J439 Emphysema, unspecified: Secondary | ICD-10-CM | POA: Diagnosis present

## 2011-12-01 DIAGNOSIS — I498 Other specified cardiac arrhythmias: Secondary | ICD-10-CM | POA: Diagnosis present

## 2011-12-01 DIAGNOSIS — F411 Generalized anxiety disorder: Secondary | ICD-10-CM | POA: Diagnosis present

## 2011-12-01 DIAGNOSIS — R000 Tachycardia, unspecified: Secondary | ICD-10-CM

## 2011-12-01 DIAGNOSIS — F329 Major depressive disorder, single episode, unspecified: Secondary | ICD-10-CM | POA: Diagnosis present

## 2011-12-01 DIAGNOSIS — D649 Anemia, unspecified: Secondary | ICD-10-CM | POA: Diagnosis present

## 2011-12-01 DIAGNOSIS — R319 Hematuria, unspecified: Secondary | ICD-10-CM

## 2011-12-01 DIAGNOSIS — R002 Palpitations: Secondary | ICD-10-CM | POA: Diagnosis present

## 2011-12-01 DIAGNOSIS — F419 Anxiety disorder, unspecified: Secondary | ICD-10-CM

## 2011-12-01 DIAGNOSIS — J441 Chronic obstructive pulmonary disease with (acute) exacerbation: Secondary | ICD-10-CM | POA: Diagnosis present

## 2011-12-01 LAB — BASIC METABOLIC PANEL
CO2: 30 mEq/L (ref 19–32)
Chloride: 104 mEq/L (ref 96–112)
Creatinine, Ser: 0.89 mg/dL (ref 0.50–1.10)
GFR calc Af Amer: 79 mL/min — ABNORMAL LOW (ref >90)
Potassium: 3.8 mEq/L (ref 3.5–5.1)
Sodium: 143 mEq/L (ref 135–145)

## 2011-12-01 LAB — CBC
MCV: 86.9 fL (ref 78.0–100.0)
Platelets: 379 10*3/uL (ref 150–400)
RBC: 3.88 MIL/uL (ref 3.87–5.11)
RDW: 13.8 % (ref 11.5–15.5)
WBC: 5.3 10*3/uL (ref 4.0–10.5)

## 2011-12-01 LAB — APTT: aPTT: 31 seconds (ref 24–37)

## 2011-12-01 LAB — PROTIME-INR
INR: 1.08 (ref 0.00–1.49)
Prothrombin Time: 14.2 seconds (ref 11.6–15.2)

## 2011-12-01 LAB — TYPE AND SCREEN: ABO/RH(D): B POS

## 2011-12-01 MED ORDER — MOXIFLOXACIN HCL IN NACL 400 MG/250ML IV SOLN
400.0000 mg | Freq: Once | INTRAVENOUS | Status: AC
  Administered 2011-12-01: 400 mg via INTRAVENOUS
  Filled 2011-12-01: qty 250

## 2011-12-01 MED ORDER — METHYLPREDNISOLONE SODIUM SUCC 125 MG IJ SOLR
125.0000 mg | Freq: Once | INTRAMUSCULAR | Status: AC
  Administered 2011-12-01: 125 mg via INTRAVENOUS
  Filled 2011-12-01: qty 2

## 2011-12-01 MED ORDER — ALBUTEROL SULFATE (5 MG/ML) 0.5% IN NEBU
2.5000 mg | INHALATION_SOLUTION | Freq: Once | RESPIRATORY_TRACT | Status: AC
  Administered 2011-12-01: 2.5 mg via RESPIRATORY_TRACT

## 2011-12-01 MED ORDER — ALBUTEROL SULFATE (5 MG/ML) 0.5% IN NEBU
INHALATION_SOLUTION | RESPIRATORY_TRACT | Status: AC
  Filled 2011-12-01: qty 0.5

## 2011-12-01 MED ORDER — ALBUTEROL SULFATE (5 MG/ML) 0.5% IN NEBU
5.0000 mg | INHALATION_SOLUTION | Freq: Once | RESPIRATORY_TRACT | Status: AC
  Administered 2011-12-01: 5 mg via RESPIRATORY_TRACT

## 2011-12-01 MED ORDER — ALBUTEROL SULFATE (5 MG/ML) 0.5% IN NEBU
INHALATION_SOLUTION | RESPIRATORY_TRACT | Status: AC
  Administered 2011-12-01: 2.5 mg via RESPIRATORY_TRACT
  Filled 2011-12-01: qty 1

## 2011-12-01 MED ORDER — IPRATROPIUM BROMIDE 0.02 % IN SOLN
0.5000 mg | Freq: Once | RESPIRATORY_TRACT | Status: AC
  Administered 2011-12-01: 0.5 mg via RESPIRATORY_TRACT
  Filled 2011-12-01: qty 2.5

## 2011-12-01 MED ORDER — IPRATROPIUM BROMIDE 0.02 % IN SOLN
0.5000 mg | Freq: Once | RESPIRATORY_TRACT | Status: AC
  Administered 2011-12-01: 0.5 mg via RESPIRATORY_TRACT

## 2011-12-01 MED ORDER — IOHEXOL 350 MG/ML SOLN
100.0000 mL | Freq: Once | INTRAVENOUS | Status: AC | PRN
  Administered 2011-12-01: 100 mL via INTRAVENOUS

## 2011-12-01 MED ORDER — ALBUTEROL SULFATE (5 MG/ML) 0.5% IN NEBU
INHALATION_SOLUTION | RESPIRATORY_TRACT | Status: AC
  Administered 2011-12-01: 5 mg
  Filled 2011-12-01: qty 1

## 2011-12-01 MED ORDER — ALBUTEROL SULFATE (5 MG/ML) 0.5% IN NEBU
5.0000 mg | INHALATION_SOLUTION | Freq: Once | RESPIRATORY_TRACT | Status: AC
  Administered 2011-12-01: 5 mg via RESPIRATORY_TRACT
  Filled 2011-12-01: qty 1

## 2011-12-01 NOTE — ED Notes (Addendum)
Patient found sitting up in bed with tachypnea.  States she is having difficulty breathing. Expiratory wheezing noted on auscultation. Accessory muscle in use. Respiratory called to reevaluate patient. Will continue to monitor patient

## 2011-12-01 NOTE — ED Provider Notes (Signed)
Pt placed in CDU for COPD exacerbation treatment. Pt receive approximately 5 albuterol treatments while in the ER and continued to feel very short of breath. The patients oxygen saturation has remained therapeutic however, she is unable to take more than 5 steps before becoming very tired and tachycardic. She says she feels as though she has run in a marathon. The patient has been admitted for this before and I do not believe that she will be well enough to go home today. I have had her admitted to [facility] team 10 , Tele.  Dorthula Matas, PA 12/01/11 2127

## 2011-12-01 NOTE — H&P (Signed)
[name] is an 61 y.o. female.   PCP - Rubbie Battiest. Pulmonary - . Chief Complaint: Shortness of breath and palpitations. 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 In addition she's been having is palpitations which has been coming off and on has been worsening. She was originally scheduled to have 2-D echo done today by [facility]. But due to the shortness of breath she decided him to the ER. In the ER despite giving multiple doses of nebulizers and IV steroids patient is to wheezing and short of breath. Patient has been admitted for COPD management. Patient states she had called her pulmonologist and they were prescribed Levaquin over the phone. She had taken one dose of Levaquin. Patient denies any chest pain dizziness or any loss of consciousness. Denies any fever chills but did notice increase cough with productive sputum.  Past Medical History  Diagnosis Date  . Other diseases of vocal cords   . Osteoporosis, unspecified   . Other and unspecified hyperlipidemia   . Esophageal reflux   . Chronic airway obstruction, not elsewhere classified   . Regional enteritis of unspecified site   . 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 Type II or unspecified type diabetes mellitus without mention of complication, not stated as uncontrolled   . Crohn's disease     Ileostomy  . Fibroid   . Pelvic adhesions   . Anxiety   . Depression   . Hypertension   . Shortness of breath   . Pneumonia   . Asthma     Past Surgical History  Procedure Date  . Ileostomy   . Vaginal hysterectomy 1993    LAVH-LSO  . Ear cyst excision   . Cervical cone biopsy 1978  . Colon surgery 2007    Resection-Ileostomy  . Esophagus stretch   . Oophorectomy     LSO    Family  History  Problem Relation Age of Onset  . Osteoporosis Mother   . Hypertension Sister   . Stroke Sister   . Diabetes Sister    Social History:  reports that she quit smoking about 8 years ago. She has never used smokeless tobacco. She reports that she drinks alcohol. Her drug history not on file.  Allergies:  Allergies  Allergen Reactions  . Esomeprazole Magnesium Other (See Comments)    NEXIUM - reaction > aggravated pt's Crohn's disease  . Other     Beans, Dander/Dust, Peas, Mushrooms  . Peanut-Containing Drug Products Other (See Comments)    unknown  . Shellfish Allergy Other (See Comments)    unknown     (Not in a hospital admission)  Results for orders placed during the hospital encounter of 12/01/11 (from the past 48 hour(s))  CBC     Status: Abnormal   Collection Time   12/01/11  3:30 PM      Component Value Range Comment   WBC 5.3  4.0 - 10.5 K/uL    RBC 3.88  3.87 - 5.11 MIL/uL    Hemoglobin 10.6 (*) 12.0 - 15.0 g/dL    HCT 54.0 (*) 98.1 - 46.0 %    MCV 86.9  78.0 - 100.0 fL    MCH 27.3  26.0 - 34.0 pg    MCHC 31.5  30.0 - 36.0 g/dL  RDW 13.8  11.5 - 15.5 %    Platelets 379  150 - 400 K/uL   BASIC METABOLIC PANEL     Status: Abnormal   Collection Time   12/01/11  3:30 PM      Component Value Range Comment   Sodium 143  135 - 145 mEq/L    Potassium 3.8  3.5 - 5.1 mEq/L    Chloride 104  96 - 112 mEq/L    CO2 30  19 - 32 mEq/L    Glucose, Bld 109 (*) 70 - 99 mg/dL    BUN 6  6 - 23 mg/dL    Creatinine, Ser 1.61  0.50 - 1.10 mg/dL    Calcium 9.7  8.4 - 09.6 mg/dL    GFR calc non Af Amer 69 (*) >90 mL/min    GFR calc Af Amer 79 (*) >90 mL/min   PROTIME-INR     Status: Normal   Collection Time   12/01/11  3:30 PM      Component Value Range Comment   Prothrombin Time 14.2  11.6 - 15.2 seconds    INR 1.08  0.00 - 1.49   APTT     Status: Normal   Collection Time   12/01/11  3:30 PM      Component Value Range Comment   aPTT 31  24 - 37 seconds   TYPE AND SCREEN      Status: Normal   Collection Time   12/01/11  3:36 PM      Component Value Range Comment   ABO/RH(D) B POS      Antibody Screen POS      Sample Expiration 12/04/2011      PT AG Type        Value: NEGATIVE FOR DUFFY A ANTIGEN NEGATIVE FOR N ANTIGEN NEGATIVE FOR S ANTIGEN NEGATIVE FOR KELL ANTIGEN NEGATIVE FOR C ANTIGEN NEGATIVE FOR E ANTIGEN POSITIVE FOR c ANTIGEN POSITIVE FOR s ANTIGEN POSITIVE FOR e ANTIGEN POSITIVE FOR KIDD B ANTIGEN POSITIVE      FOR KIDD A ANTIGEN POSITIVE FOR M ANTIGEN   Antibody Identification NO CLINICALLY SIGNIFICANT ANTIBODY IDENTIFIED.      Dg Chest 2 View  12/01/2011  *RADIOLOGY REPORT*  Clinical Data: Shortness of breath for 1 week.  COPD.  Type 2 diabetes.  CHEST - 2 VIEW  Comparison: 10/31/2011  Findings: Lateral view degraded by patient arm position.  Midline trachea.  Normal heart size and mediastinal contours. Blunting of the costophrenic angles on the frontal is felt to be due to pleural thickening. No pneumothorax.  Mild interstitial thickening.  More focal linear opacity in the right suprahilar region/right upper lobe is most consistent with scarring.  The questioned nodular density within the lateral right upper lobe on the prior plain film is not readily apparent.  Prominent anterior 1st right rib versus right upper lobe scarring more medially. Similar to 10/09/2009. No lobar consolidation.  There is chronic volume loss at the left lung base.  IMPRESSION:  1.  COPD/chronic bronchitis without acute superimposed process. 2.  Foci of right upper lobe scarring.  The nodular density questioned on 10/31/2011 is not readily apparent.  Please see that report and consider further evaluation with chest CT to exclude nodule in this area.  Original Report Authenticated By: Consuello Bossier, M.D.    Review of Systems  Constitutional: Negative.   HENT: Negative.   Eyes: Negative.   Respiratory: Positive for sputum production, shortness of breath and wheezing.  Cardiovascular: Positive for palpitations.  Gastrointestinal: Negative.   Genitourinary: Negative.   Musculoskeletal: Negative.   Skin: Negative.   Neurological: Negative.   Endo/Heme/Allergies: Negative.   Psychiatric/Behavioral: Negative.     Blood pressure 130/72, pulse 123, temperature 98.7 F (37.1 C), temperature source Oral, resp. rate 22, SpO2 100.00%. Physical Exam  Constitutional: She is oriented to person, place, and time. She appears well-developed and well-nourished. No distress.  HENT:  Head: Normocephalic and atraumatic.  Right Ear: External ear normal.  Left Ear: External ear normal.  Nose: Nose normal.  Mouth/Throat: Oropharynx is clear and moist. No oropharyngeal exudate.  Eyes: Conjunctivae are normal. Pupils are equal, round, and reactive to light. Right eye exhibits no discharge. Left eye exhibits no discharge. No scleral icterus.  Neck: Normal range of motion. Neck supple.  Cardiovascular:       Sinus tachycardia.  Respiratory: Effort normal. No respiratory distress. She has wheezes. She has no rales.  GI: Soft. Bowel sounds are normal. She exhibits no distension. There is no tenderness. There is no rebound.  Musculoskeletal: Normal range of motion. She exhibits no edema and no tenderness.  Neurological: She is alert and oriented to person, place, and time.       Moves all extremities.  Skin: Skin is warm and dry. She is not diaphoretic.  Psychiatric: Her behavior is normal.     Assessment/Plan #1. COPD exacerbation - continue with nebulizer. As patient is significantly tachycardic I have placed patient on Xopenex instead of albuterol. Continue Atrovent and IV steroids with Pulmicort and Levaquin. Since patient states that her short of breath is increased more than usual I have ordered CT angiogram of the chest and a BNP level. #2. Palpitations with history of multifocal atrial tachycardia - continue Cardizem. As suggested earlier I have placed patient on  Xopenex and stop albuterol. Patient was originally supposed to have 2-D echo done by [facility] today which I have ordered. I have also listed them as consult. Check thyroid function tests. #3. Diabetes mellitus2 - placed patient on sliding-scale coverage. #4. Hyperlipidemia - continue present medications. #5. Anemia - patient is on iron replacement medications. I have otherwise patient to have further workup done as outpatient for her anemia to her PCP if there is no significant fall in hemoglobin during inpatient stay. #6. History of Crohn's status post colostomy bag placement presently stable.  CODE STATUS - full code.  Eduard Clos 12/01/2011, 11:33 PM

## 2011-12-01 NOTE — ED Provider Notes (Signed)
Medical screening examination/treatment/procedure(s) were conducted as a shared visit with non-physician practitioner(s) and myself.  I personally evaluated the patient during the encounter  I saw this patient primarily  Ethelda Chick, MD 12/01/11 2219

## 2011-12-01 NOTE — ED Provider Notes (Signed)
History     CSN: [id number]  Arrival date & time 12/01/11  1507   First MD Initiated Contact with Patient 12/01/11 1512      Chief Complaint  Patient presents with  . Shortness of Breath    (Consider location/radiation/quality/duration/timing/severity/associated sxs/prior treatment) HPI Pt presents shortness of breath, wheezing, increased sputum production of brownish sputum over the past several days.  Pt uses home O2, 1-2 liters [place].  No fever/chills, no chest pain.  No leg swelling.  She has a colostomy since 2007 due to Crohn's colitis.  She has had small amount of red blood at rectum over the past several weeks.  No bleeding in colostomy bag.  No abdominal pain or change in colostomy output.  There are no other associated systemic symptoms, there are no other alleviating or modifying factors.   She states she was on chronic steroids for years   Past Medical History  Diagnosis Date  . Other diseases of vocal cords   . Osteoporosis, unspecified   . Other and unspecified hyperlipidemia   . Esophageal reflux   . Chronic airway obstruction, not elsewhere classified   . Regional enteritis of unspecified site   . 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 Type II or unspecified type diabetes mellitus without mention of complication, not stated as uncontrolled   . Crohn's disease     Ileostomy  . Fibroid   . Pelvic adhesions   . Anxiety   . Depression   . Hypertension   . Shortness of breath   . Pneumonia   . Asthma     Past Surgical History  Procedure Date  . Ileostomy   . Vaginal hysterectomy 1993    LAVH-LSO  . Ear cyst excision   . Cervical cone biopsy 1978  . Colon surgery 2007    Resection-Ileostomy  . Esophagus stretch   . Oophorectomy     LSO    Family History  Problem Relation Age of Onset  . Osteoporosis Mother   . Hypertension Sister   . Stroke Sister   . Diabetes Sister     History  Substance Use Topics  . Smoking status: Former  Smoker -- 1.0 packs/day for 32 years    Quit date: 05/27/2003  . Smokeless tobacco: Never Used  . Alcohol Use: 0.0 oz/week    0 Glasses of wine per week     occasional    OB History    Grav Para Term Preterm Abortions TAB SAB Ect Mult Living   3 0   3           Review of Systems ROS reviewed and all otherwise negative except for mentioned in HPI  Allergies  Esomeprazole magnesium; Other; Peanut-containing drug products; and Shellfish allergy  Home Medications   No current outpatient prescriptions on file.  BP 130/84  Pulse 125  Temp 98.4 F (36.9 C) (Oral)  Resp 16  Ht 5\' 1"  (1.549 m)  Wt 133 lb 13.1 oz (60.7 kg)  BMI 25.28 kg/m2  SpO2 99% Vitals reviewed Physical Exam Physical Examination: General appearance - alert, well appearing, and in no distress Mental status - alert, oriented to person, place, and time Eyes - pupils equal and reactive, no conjunctival injection, no scleral icterus Mouth - mucous membranes moist, pharynx normal without lesions Chest -bilateral coarse inspiratory and expiratory wheezing, mild increased respiratory effort, speaking in full sentences Heart - tachycardic, regular rhythm, normal S1, S2, no murmurs, rubs, clicks or gallops Abdomen -  soft, nontender, nondistended, no masses or organomegaly, nabs, colostomy bag in place and draining Neurological - alert, oriented, normal speech, cranial nerves 2-12 grossly intact, strength grossly intact and sensation intact Musculoskeletal - no joint tenderness, deformity or swelling Extremities - peripheral pulses normal, no pedal edema, no clubbing or cyanosis Skin - normal coloration and turgor, no rashes Psych- mildly anxious but calm and pleasant  ED Course  Procedures (including critical care time)   Date: 12/01/2011  Rate: 118  Rhythm: sinus tachycardia  QRS Axis: normal  Intervals: normal  ST/T Wave abnormalities: nonspecific T wave changes  Conduction Disutrbances:none  Narrative  Interpretation:   Old EKG Reviewed: no significant changes compared to prior ekg of 11/26/2007    5:10 PM  Discussed with Marlon Pel, PA- pt to be moved to the CDU, pt is getting second neb now, she may need admission unless she has significant improved.    Labs Reviewed  CBC - Abnormal; Notable for the following:    Hemoglobin 10.6 (*)     HCT 33.7 (*)     All other components within normal limits  BASIC METABOLIC PANEL - Abnormal; Notable for the following:    Glucose, Bld 109 (*)     GFR calc non Af Amer 69 (*)     GFR calc Af Amer 79 (*)     All other components within normal limits  COMPREHENSIVE METABOLIC PANEL - Abnormal; Notable for the following:    Glucose, Bld 152 (*)     Albumin 2.8 (*)     Total Bilirubin 0.1 (*)     GFR calc non Af Amer 74 (*)     GFR calc Af Amer 85 (*)     All other components within normal limits  CBC WITH DIFFERENTIAL - Abnormal; Notable for the following:    RBC 3.53 (*)     Hemoglobin 9.9 (*)     HCT 30.4 (*)     Neutrophils Relative 83 (*)     Lymphs Abs 0.6 (*)     Monocytes Relative 2 (*)     All other components within normal limits  CBC - Abnormal; Notable for the following:    WBC 3.5 (*)  WHITE COUNT CONFIRMED ON SMEAR   RBC 3.67 (*)     Hemoglobin 10.0 (*)     HCT 31.7 (*)     All other components within normal limits  CREATININE, SERUM - Abnormal; Notable for the following:    GFR calc non Af Amer 71 (*)     GFR calc Af Amer 83 (*)     All other components within normal limits  TSH - Abnormal; Notable for the following:    TSH 0.080 (*)     All other components within normal limits  GLUCOSE, CAPILLARY - Abnormal; Notable for the following:    Glucose-Capillary 150 (*)     All other components within normal limits  MRSA PCR SCREENING - Abnormal; Notable for the following:    MRSA by PCR POSITIVE (*)     All other components within normal limits  GLUCOSE, CAPILLARY - Abnormal; Notable for the following:     Glucose-Capillary 136 (*)     All other components within normal limits  GLUCOSE, CAPILLARY - Abnormal; Notable for the following:    Glucose-Capillary 139 (*)     All other components within normal limits  GLUCOSE, CAPILLARY - Abnormal; Notable for the following:    Glucose-Capillary 204 (*)  All other components within normal limits  CBC - Abnormal; Notable for the following:    WBC 16.1 (*)     RBC 3.54 (*)     Hemoglobin 10.0 (*)     HCT 30.1 (*)     All other components within normal limits  BASIC METABOLIC PANEL - Abnormal; Notable for the following:    Glucose, Bld 136 (*)     GFR calc non Af Amer 77 (*)     GFR calc Af Amer 89 (*)     All other components within normal limits  GLUCOSE, CAPILLARY - Abnormal; Notable for the following:    Glucose-Capillary 124 (*)     All other components within normal limits  GLUCOSE, CAPILLARY - Abnormal; Notable for the following:    Glucose-Capillary 170 (*)     All other components within normal limits  GLUCOSE, CAPILLARY - Abnormal; Notable for the following:    Glucose-Capillary 134 (*)     All other components within normal limits  GLUCOSE, CAPILLARY - Abnormal; Notable for the following:    Glucose-Capillary 127 (*)     All other components within normal limits  GLUCOSE, CAPILLARY - Abnormal; Notable for the following:    Glucose-Capillary 167 (*)     All other components within normal limits  CBC - Abnormal; Notable for the following:    WBC 16.7 (*)     RBC 3.68 (*)     Hemoglobin 10.1 (*)     HCT 31.2 (*)     All other components within normal limits  BASIC METABOLIC PANEL - Abnormal; Notable for the following:    Glucose, Bld 140 (*)     GFR calc non Af Amer 71 (*)     GFR calc Af Amer 83 (*)     All other components within normal limits  GLUCOSE, CAPILLARY - Abnormal; Notable for the following:    Glucose-Capillary 154 (*)     All other components within normal limits  PROTIME-INR  APTT  TYPE AND SCREEN  PRO B  NATRIURETIC PEPTIDE  CARDIAC PANEL(CRET KIN+CKTOT+MB+TROPI)  T4, FREE  T3, FREE   No results found.   1. COPD exacerbation   2. Palpitations   3. Regional enteritis of unspecified site   4. Type II or unspecified type diabetes mellitus without mention of complication, not stated as uncontrolled   5. Anxiety disorder   6. Depressive disorder, not elsewhere classified   7. Other and unspecified hyperlipidemia   8. Acute-on-chronic respiratory failure   9. Low TSH level       MDM  PT moved to CDU for nebs, has been given steroids.  Will see if she can clear in terms of her breathing, however suspect may need admission for inpatient management.  Colostomy is functioning well, abdomen nontender.  Pt has been seeing small amounts of blood at rectal opening- however she does not have attached colon and colostomy has been functioning well. No blood into colostomy which is attached to her GI tract.         Ethelda Chick, MD 12/04/11 1017

## 2011-12-01 NOTE — ED Notes (Signed)
Adjusted blankets on pt and reattached ekg leads.

## 2011-12-01 NOTE — ED Notes (Addendum)
Per EMS, pt c/o SOB and minor rectal bleeding for past two days. Pt does have hx of COPD and asthma, on O2 continuously with productive cough with brown sputum. Also has a colostomy. 18g LAC, ST on monitor at 118, 100% on 2 liters/ [place] after albuterol treatment. EMS reports she has congestion and upper lobe wheezing. Pt called her pulmonologist yesterday and started on levaquin. Has not had today. Sputum past two days is heavier and thicker in morning.

## 2011-12-01 NOTE — Telephone Encounter (Signed)
I spoke with pt ands he was requesting to be seen tomorrow. Pt is scheduled to come in and see [facility] tomorrow 12/02/11 at 1:45 for sick visit. Pt aware to seek emergency care if she worsens in the meantime. She voiced her understanding

## 2011-12-01 NOTE — Telephone Encounter (Signed)
This pt will need oV with myself or TP this week

## 2011-12-02 ENCOUNTER — Ambulatory Visit: Payer: Medicare Other | Admitting: Pulmonary Disease

## 2011-12-02 DIAGNOSIS — E785 Hyperlipidemia, unspecified: Secondary | ICD-10-CM

## 2011-12-02 DIAGNOSIS — IMO0001 Reserved for inherently not codable concepts without codable children: Secondary | ICD-10-CM

## 2011-12-02 DIAGNOSIS — F411 Generalized anxiety disorder: Secondary | ICD-10-CM

## 2011-12-02 DIAGNOSIS — R7989 Other specified abnormal findings of blood chemistry: Secondary | ICD-10-CM | POA: Diagnosis present

## 2011-12-02 DIAGNOSIS — F329 Major depressive disorder, single episode, unspecified: Secondary | ICD-10-CM

## 2011-12-02 LAB — CBC
HCT: 31.7 % — ABNORMAL LOW (ref 36.0–46.0)
Hemoglobin: 10 g/dL — ABNORMAL LOW (ref 12.0–15.0)
RBC: 3.67 MIL/uL — ABNORMAL LOW (ref 3.87–5.11)
WBC: 3.5 10*3/uL — ABNORMAL LOW (ref 4.0–10.5)

## 2011-12-02 LAB — CBC WITH DIFFERENTIAL/PLATELET
Basophils Absolute: 0 10*3/uL (ref 0.0–0.1)
Basophils Relative: 0 % (ref 0–1)
HCT: 30.4 % — ABNORMAL LOW (ref 36.0–46.0)
Lymphocytes Relative: 15 % (ref 12–46)
Monocytes Absolute: 0.1 10*3/uL (ref 0.1–1.0)
Neutro Abs: 3.4 10*3/uL (ref 1.7–7.7)
Neutrophils Relative %: 83 % — ABNORMAL HIGH (ref 43–77)
RDW: 14 % (ref 11.5–15.5)
WBC: 4.1 10*3/uL (ref 4.0–10.5)

## 2011-12-02 LAB — TSH: TSH: 0.08 u[IU]/mL — ABNORMAL LOW (ref 0.350–4.500)

## 2011-12-02 LAB — CARDIAC PANEL(CRET KIN+CKTOT+MB+TROPI)
CK, MB: 2 ng/mL (ref 0.3–4.0)
Total CK: 65 U/L (ref 7–177)

## 2011-12-02 LAB — CREATININE, SERUM
Creatinine, Ser: 0.86 mg/dL (ref 0.50–1.10)
GFR calc Af Amer: 83 mL/min — ABNORMAL LOW (ref >90)
GFR calc non Af Amer: 71 mL/min — ABNORMAL LOW (ref >90)

## 2011-12-02 LAB — GLUCOSE, CAPILLARY
Glucose-Capillary: 139 mg/dL — ABNORMAL HIGH (ref 70–99)
Glucose-Capillary: 124 mg/dL — ABNORMAL HIGH (ref 70–99)

## 2011-12-02 LAB — COMPREHENSIVE METABOLIC PANEL
Alkaline Phosphatase: 72 U/L (ref 39–117)
BUN: 8 mg/dL (ref 6–23)
CO2: 22 mEq/L (ref 19–32)
Calcium: 9.9 mg/dL (ref 8.4–10.5)
GFR calc Af Amer: 85 mL/min — ABNORMAL LOW (ref >90)
GFR calc non Af Amer: 74 mL/min — ABNORMAL LOW (ref >90)
Glucose, Bld: 152 mg/dL — ABNORMAL HIGH (ref 70–99)
Potassium: 3.8 mEq/L (ref 3.5–5.1)
Total Protein: 6.6 g/dL (ref 6.0–8.3)

## 2011-12-02 LAB — T4, FREE: Free T4: 1.02 ng/dL (ref 0.80–1.80)

## 2011-12-02 LAB — T3, FREE: T3, Free: 3.1 pg/mL (ref 2.3–4.2)

## 2011-12-02 MED ORDER — ONDANSETRON HCL 4 MG/2ML IJ SOLN: 4.0000 mg | Freq: Four times a day (QID) | INTRAMUSCULAR | Status: DC | PRN

## 2011-12-02 MED ORDER — IPRATROPIUM BROMIDE 0.02 % IN SOLN
0.5000 mg | RESPIRATORY_TRACT | Status: DC
  Administered 2011-12-02 – 2011-12-04 (×12): 0.5 mg via RESPIRATORY_TRACT
  Filled 2011-12-02 (×9): qty 2.5

## 2011-12-02 MED ORDER — HYDROCODONE-ACETAMINOPHEN 5-325 MG PO TABS: 1.0000 | ORAL_TABLET | Freq: Four times a day (QID) | ORAL | Status: DC | PRN

## 2011-12-02 MED ORDER — LEVOFLOXACIN IN D5W 500 MG/100ML IV SOLN
500.0000 mg | INTRAVENOUS | Status: DC
  Administered 2011-12-03 – 2011-12-04 (×2): 500 mg via INTRAVENOUS
  Filled 2011-12-02 (×3): qty 100

## 2011-12-02 MED ORDER — ZAFIRLUKAST 20 MG PO TABS
10.0000 mg | ORAL_TABLET | Freq: Two times a day (BID) | ORAL | Status: DC
  Administered 2011-12-02 (×3): 10 mg via ORAL
  Administered 2011-12-03: 11:00:00 via ORAL
  Administered 2011-12-03 – 2011-12-05 (×4): 10 mg via ORAL
  Filled 2011-12-02 (×9): qty 1

## 2011-12-02 MED ORDER — MUPIROCIN 2 % EX OINT
1.0000 "application " | TOPICAL_OINTMENT | Freq: Two times a day (BID) | CUTANEOUS | Status: DC
  Administered 2011-12-02 – 2011-12-05 (×7): 1 via NASAL
  Filled 2011-12-02: qty 22

## 2011-12-02 MED ORDER — FUROSEMIDE 20 MG PO TABS
20.0000 mg | ORAL_TABLET | Freq: Every day | ORAL | Status: DC
  Administered 2011-12-02 – 2011-12-05 (×4): 20 mg via ORAL
  Filled 2011-12-02 (×4): qty 1

## 2011-12-02 MED ORDER — ENOXAPARIN SODIUM 40 MG/0.4ML ~~LOC~~ SOLN
40.0000 mg | SUBCUTANEOUS | Status: DC
  Administered 2011-12-02 – 2011-12-04 (×3): 40 mg via SUBCUTANEOUS
  Filled 2011-12-02 (×4): qty 0.4

## 2011-12-02 MED ORDER — METOPROLOL TARTRATE 12.5 MG HALF TABLET
12.5000 mg | ORAL_TABLET | Freq: Two times a day (BID) | ORAL | Status: DC
  Administered 2011-12-02 – 2011-12-05 (×6): 12.5 mg via ORAL
  Filled 2011-12-02 (×8): qty 1

## 2011-12-02 MED ORDER — CITALOPRAM HYDROBROMIDE 20 MG PO TABS
20.0000 mg | ORAL_TABLET | Freq: Every day | ORAL | Status: DC
  Administered 2011-12-02 – 2011-12-05 (×4): 20 mg via ORAL
  Filled 2011-12-02 (×4): qty 1

## 2011-12-02 MED ORDER — SODIUM CHLORIDE 0.9 % IJ SOLN
3.0000 mL | Freq: Two times a day (BID) | INTRAMUSCULAR | Status: DC
  Administered 2011-12-02 – 2011-12-05 (×7): 3 mL via INTRAVENOUS

## 2011-12-02 MED ORDER — DILTIAZEM HCL ER COATED BEADS 180 MG PO CP24
180.0000 mg | ORAL_CAPSULE | Freq: Two times a day (BID) | ORAL | Status: DC
  Administered 2011-12-02 – 2011-12-05 (×8): 180 mg via ORAL
  Filled 2011-12-02 (×11): qty 1

## 2011-12-02 MED ORDER — ADULT MULTIVITAMIN W/MINERALS CH
1.0000 | ORAL_TABLET | Freq: Every day | ORAL | Status: DC
  Administered 2011-12-02 – 2011-12-05 (×4): 1 via ORAL
  Filled 2011-12-02 (×4): qty 1

## 2011-12-02 MED ORDER — LEVALBUTEROL HCL 0.63 MG/3ML IN NEBU
0.6300 mg | INHALATION_SOLUTION | RESPIRATORY_TRACT | Status: DC
  Administered 2011-12-02 – 2011-12-04 (×12): 0.63 mg via RESPIRATORY_TRACT
  Filled 2011-12-02 (×17): qty 3

## 2011-12-02 MED ORDER — SODIUM CHLORIDE 0.9 % IJ SOLN: 3.0000 mL | Freq: Two times a day (BID) | INTRAMUSCULAR | Status: DC

## 2011-12-02 MED ORDER — GUAIFENESIN ER 600 MG PO TB12
600.0000 mg | ORAL_TABLET | Freq: Two times a day (BID) | ORAL | Status: DC
  Administered 2011-12-02 – 2011-12-04 (×4): 600 mg via ORAL
  Filled 2011-12-02 (×7): qty 1

## 2011-12-02 MED ORDER — CHLORHEXIDINE GLUCONATE CLOTH 2 % EX PADS
6.0000 | MEDICATED_PAD | Freq: Every day | CUTANEOUS | Status: DC
  Administered 2011-12-02 – 2011-12-05 (×3): 6 via TOPICAL

## 2011-12-02 MED ORDER — WHITE PETROLATUM GEL
Status: AC
  Administered 2011-12-02: 0.2
  Filled 2011-12-02: qty 5

## 2011-12-02 MED ORDER — ASPIRIN 81 MG PO CHEW
81.0000 mg | CHEWABLE_TABLET | ORAL | Status: DC
  Administered 2011-12-03 – 2011-12-05 (×2): 81 mg via ORAL
  Filled 2011-12-02 (×2): qty 1

## 2011-12-02 MED ORDER — POLYSACCHARIDE IRON COMPLEX 150 MG PO CAPS
150.0000 mg | ORAL_CAPSULE | Freq: Two times a day (BID) | ORAL | Status: DC
  Administered 2011-12-02 – 2011-12-05 (×7): 150 mg via ORAL
  Filled 2011-12-02 (×9): qty 1

## 2011-12-02 MED ORDER — VITAMIN D 1000 UNITS PO TABS
1000.0000 [IU] | ORAL_TABLET | Freq: Every day | ORAL | Status: DC
  Administered 2011-12-02 – 2011-12-05 (×4): 1000 [IU] via ORAL
  Filled 2011-12-02 (×4): qty 1

## 2011-12-02 MED ORDER — INSULIN ASPART 100 UNIT/ML ~~LOC~~ SOLN
0.0000 [IU] | Freq: Three times a day (TID) | SUBCUTANEOUS | Status: DC
  Administered 2011-12-02: 3 [IU] via SUBCUTANEOUS
  Administered 2011-12-02 – 2011-12-03 (×3): 1 [IU] via SUBCUTANEOUS
  Administered 2011-12-03: 2 [IU] via SUBCUTANEOUS
  Administered 2011-12-03 – 2011-12-04 (×3): 1 [IU] via SUBCUTANEOUS

## 2011-12-02 MED ORDER — LEVOFLOXACIN IN D5W 500 MG/100ML IV SOLN
500.0000 mg | Freq: Once | INTRAVENOUS | Status: AC
  Administered 2011-12-02: 500 mg via INTRAVENOUS
  Filled 2011-12-02: qty 100

## 2011-12-02 MED ORDER — FERROUS SULFATE 325 (65 FE) MG PO TABS
325.0000 mg | ORAL_TABLET | ORAL | Status: DC
  Filled 2011-12-02: qty 1

## 2011-12-02 MED ORDER — PANTOPRAZOLE SODIUM 40 MG PO TBEC
40.0000 mg | DELAYED_RELEASE_TABLET | Freq: Every day | ORAL | Status: DC
  Administered 2011-12-02 – 2011-12-03 (×2): 40 mg via ORAL
  Filled 2011-12-02 (×2): qty 1

## 2011-12-02 MED ORDER — GUAIFENESIN ER 600 MG PO TB12
1200.0000 mg | ORAL_TABLET | Freq: Every day | ORAL | Status: DC
  Administered 2011-12-02 – 2011-12-03 (×2): 1200 mg via ORAL
  Filled 2011-12-02 (×3): qty 2

## 2011-12-02 MED ORDER — BUDESONIDE 0.5 MG/2ML IN SUSP
0.5000 mg | Freq: Two times a day (BID) | RESPIRATORY_TRACT | Status: DC
  Administered 2011-12-02 – 2011-12-05 (×7): 0.5 mg via RESPIRATORY_TRACT
  Filled 2011-12-02 (×10): qty 2

## 2011-12-02 MED ORDER — LEVALBUTEROL HCL 0.63 MG/3ML IN NEBU
0.6300 mg | INHALATION_SOLUTION | Freq: Four times a day (QID) | RESPIRATORY_TRACT | Status: DC | PRN
  Filled 2011-12-02: qty 3

## 2011-12-02 MED ORDER — LORAZEPAM 0.5 MG PO TABS
0.5000 mg | ORAL_TABLET | Freq: Four times a day (QID) | ORAL | Status: DC | PRN
  Administered 2011-12-02 – 2011-12-05 (×4): 0.5 mg via ORAL
  Filled 2011-12-02 (×4): qty 1

## 2011-12-02 MED ORDER — IPRATROPIUM BROMIDE 0.02 % IN SOLN
0.5000 mg | Freq: Four times a day (QID) | RESPIRATORY_TRACT | Status: DC
  Administered 2011-12-02: 0.5 mg via RESPIRATORY_TRACT
  Filled 2011-12-02 (×2): qty 2.5

## 2011-12-02 MED ORDER — ACETAMINOPHEN 325 MG PO TABS: 650.0000 mg | ORAL_TABLET | Freq: Four times a day (QID) | ORAL | Status: DC | PRN

## 2011-12-02 MED ORDER — ACETAMINOPHEN 650 MG RE SUPP: 650.0000 mg | Freq: Four times a day (QID) | RECTAL | Status: DC | PRN

## 2011-12-02 MED ORDER — LEVALBUTEROL HCL 0.63 MG/3ML IN NEBU
0.6300 mg | INHALATION_SOLUTION | Freq: Four times a day (QID) | RESPIRATORY_TRACT | Status: DC
  Administered 2011-12-02: 0.63 mg via RESPIRATORY_TRACT
  Filled 2011-12-02 (×6): qty 3

## 2011-12-02 MED ORDER — ONDANSETRON HCL 4 MG PO TABS: 4.0000 mg | ORAL_TABLET | Freq: Four times a day (QID) | ORAL | Status: DC | PRN

## 2011-12-02 MED ORDER — GUAIFENESIN ER 600 MG PO TB12
600.0000 mg | ORAL_TABLET | Freq: Three times a day (TID) | ORAL | Status: DC
  Filled 2011-12-02 (×3): qty 2

## 2011-12-02 MED ORDER — METHYLPREDNISOLONE SODIUM SUCC 40 MG IJ SOLR
40.0000 mg | Freq: Two times a day (BID) | INTRAMUSCULAR | Status: DC
  Administered 2011-12-02 – 2011-12-04 (×5): 40 mg via INTRAVENOUS
  Filled 2011-12-02 (×7): qty 1

## 2011-12-02 MED ORDER — ATORVASTATIN CALCIUM 20 MG PO TABS
20.0000 mg | ORAL_TABLET | Freq: Every day | ORAL | Status: DC
  Administered 2011-12-02 – 2011-12-04 (×3): 20 mg via ORAL
  Filled 2011-12-02 (×4): qty 1

## 2011-12-02 MED ORDER — ZAFIRLUKAST 20 MG PO TABS
10.0000 mg | ORAL_TABLET | Freq: Two times a day (BID) | ORAL | Status: DC
  Filled 2011-12-02: qty 1

## 2011-12-02 NOTE — ED Notes (Signed)
Assist pt. Onto bedpan.[name]

## 2011-12-02 NOTE — Care Management Note (Signed)
    Page 1 of 1   12/05/2011     2:26:05 PM   CARE MANAGEMENT NOTE 12/05/2011  Patient:  [name], [name]   Account Number:  [id number]  Date Initiated:  12/02/2011  Documentation initiated by:  Letha Cape  Subjective/Objective Assessment:   dx es copd exac  admit- lives with mom.  pta independent. Has had h[facility] services with [facility] and [facility] in the past.     Action/Plan:   Anticipated DC Date:  12/05/2011   Anticipated DC Plan:  HOME/SELF CARE      DC Planning Services  CM consult      Choice offered to / List presented to:             Status of service:  Completed, signed off Medicare Important Message given?   (If response is "NO", the following Medicare IM given date fields will be blank) Date Medicare IM given:   Date Additional Medicare IM given:    Discharge Disposition:  HOME/SELF CARE  Per UR Regulation:  Reviewed for med. necessity/level of care/duration of stay  If discussed at Long Length of Stay Meetings, dates discussed:    Comments:  PCP Dorothyann Peng   12/05/11 9:55 Letha Cape RN, BSN [phone] patient has private duty list,  she and the occupational therapist ordered [name] folding tub chair from [email or web address] yesterday.  Patient stated she wants [name] folding chair so that she can take it down.  Patient is also  set up with [facility].   Patient also spoke with CSW regarding updating advance directives .  Patient is for possible dc today.  12/02/11 16:42 Letha Cape RN, BSN [phone] patient lives with mom, pta independent, she has home oxygen with [employer].  Patient has had hh services in the past with [place] [facility] and [facility], she states if she needs hh services again she would not mind going back with [place]. Patient would like to have [name] private duty list and she would like to speak with CSW about some indep living facilities and also about updating her Advance Directives. NCM will continue to follow for dc needs.

## 2011-12-02 NOTE — Progress Notes (Signed)
  Echocardiogram 2D Echocardiogram has been performed.  [name] 12/02/2011, 2:38 PM

## 2011-12-02 NOTE — Progress Notes (Signed)
61yo female admitted for COPD exacerbation, CT of chest shows no new infection (though does show sequelae of prior infection), to begin Levaquin.  Will begin Levaquin 500mg  IV Q24H for CrCl ~70ml/min.  Vernard Gambles, PharmD, BCPS 12/02/2011 1:15 AM

## 2011-12-02 NOTE — Clinical Documentation Improvement (Signed)
RESPIRATORY FAILURE DOCUMENTATION CLARIFICATION QUERY   THIS DOCUMENT IS NOT A PERMANENT PART OF THE MEDICAL RECORD  TO RESPOND TO THE THIS QUERY, FOLLOW THE INSTRUCTIONS BELOW:  1. If needed, update documentation for the patient's encounter via the notes activity.  2. Access this query again and click edit on the In [employer].  3. After updating, or not, click F2 to complete all highlighted (required) fields concerning your review. Select "additional documentation in the medical record" OR "no additional documentation provided".  4. Click Sign note button.  5. The deficiency will fall out of your In Basket *Please let us know if you are not able to complete this workflow by phone or e-mail (listed below).  Please update your documentation within the medical record to reflect your response to this query.                                                                                    12/02/11  Dear Dr. [facility],  In a better effort to capture your patient's severity of illness, reflect appropriate length of stay and utilization of resources, a review of the patient medical record has revealed the following indicators.    Based on your clinical judgment, please clarify and document in a progress note and/or discharge summary the clinical condition associated with the following supporting information:  In responding to this query please exercise your independent judgment.  The fact that a query is asked, does not imply that any particular answer is desired or expected.  Possible Clinical Conditions Chronic Respiratory Failure  Acute Respiratory Failure  Acute on Chronic Respiratory Failure  Other Condition  Cannot Clinically Determine    Supporting Information:  Risk Factors: Hx of Stage IV COPD on home oxygen  Current treatment of COPD Exacerbation  Signs&Symptoms: "Worsening shortness of breath over the last 2 days even with minimal exertion" per  notes   Treatment: Pulmicort Neb 2 times a day Mucinex 1200mg  po daily Atrovent Neb every 4 hrs Xopenex Neb every 4 hrs and Q6prn Solumedrol 40mg  IVP every 12hrs [place]@2 -3LPM Monitoring of vital signs/pulse oximetry                   You may use possible, probable, or suspect with inpatient documentation. possible, probable, suspected diagnoses MUST be documented at the time of discharge  Reviewed: additional documentation in the medical record  Thank You,    Rossie Muskrat RN, BSN Clinical Documentation Specialist Pager:  [phone] [email or web address]  Health Information Management [place]

## 2011-12-02 NOTE — Progress Notes (Addendum)
PATIENT DETAILS Name: [name] Age: 61 y.o. Sex: female Date of Birth: 09-Sep-1950 Admit Date: 12/01/2011 ZOX:[id number] N, MD  Subjective: Admitted with Shortness of breath  Objective: Vital signs in last 24 hours: Filed Vitals:   12/02/11 0210 12/02/11 0546 12/02/11 0811 12/02/11 1104  BP:  113/65  112/68  Pulse:  105    Temp:  98.3 F (36.8 C)    TempSrc:  Oral    Resp:      Height:      Weight:      SpO2: 96% 95% 94%     Weight change:   Body mass index is 25.58 kg/(m^2).  Intake/Output from previous day:  Intake/Output Summary (Last 24 hours) at 12/02/11 1230 Last data filed at 12/02/11 0900  Gross per 24 hour  Intake    340 ml  Output    275 ml  Net     65 ml    PHYSICAL EXAM: Gen Exam: Awake and alert with clear speech. Speaking in full sentences Neck: Supple, No JVD.   Chest: Good air entry B/L wheezing/Rhonchi-coarse all over   CVS: S1 S2 Regular, no murmurs.  Abdomen: soft, BS +, non tender, non distended.  Extremities: no edema, lower extremities warm to touch. Neurologic: Non Focal.   Skin: No Rash.   Wounds: N/A.    CONSULTS:  None  LAB RESULTS: CBC  Lab 12/02/11 0550 12/02/11 0101 12/01/11 1530  WBC 4.1 3.5* 5.3  HGB 9.9* 10.0* 10.6*  HCT 30.4* 31.7* 33.7*  PLT 368 PLATELET CLUMPS NOTED ON SMEAR, COUNT APPEARS ADEQUATE 379  MCV 86.1 86.4 86.9  MCH 28.0 27.2 27.3  MCHC 32.6 31.5 31.5  RDW 14.0 13.9 13.8  LYMPHSABS 0.6* -- --  MONOABS 0.1 -- --  EOSABS 0.0 -- --  BASOSABS 0.0 -- --  BANDABS -- -- --    Chemistries   Lab 12/02/11 0550 12/02/11 0101 12/01/11 1530  NA 139 -- 143  K 3.8 -- 3.8  CL 103 -- 104  CO2 22 -- 30  GLUCOSE 152* -- 109*  BUN 8 -- 6  CREATININE 0.84 0.86 0.89  CALCIUM 9.9 -- 9.7  MG -- -- --    CBG:  Lab 12/02/11 1139 12/02/11 0908 12/02/11 0713 12/02/11 0049  GLUCAP 204* 139* 136* 150*    GFR Estimated Creatinine Clearance: 59.1 ml/min (by C-G formula based on Cr of  0.84).  Coagulation profile  Lab 12/01/11 1530  INR 1.08  PROTIME --    Cardiac Enzymes  Lab 12/02/11 0101  CKMB 2.0  TROPONINI <0.30  MYOGLOBIN --    No components found with this basename: POCBNP:3 No results found for this basename: DDIMER:2 in the last 72 hours No results found for this basename: HGBA1C:2 in the last 72 hours No results found for this basename: CHOL:2,HDL:2,LDLCALC:2,TRIG:2,CHOLHDL:2,LDLDIRECT:2 in the last 72 hours No results found for this basename: TSH,T4TOTAL,FREET3,T3FREE,THYROIDAB in the last 72 hours No results found for this basename: VITAMINB12:2,FOLATE:2,FERRITIN:2,TIBC:2,IRON:2,RETICCTPCT:2 in the last 72 hours No results found for this basename: LIPASE:2,AMYLASE:2 in the last 72 hours  Urine Studies No results found for this basename: UACOL:2,UAPR:2,USPG:2,UPH:2,UTP:2,UGL:2,UKET:2,UBIL:2,UHGB:2,UNIT:2,UROB:2,ULEU:2,UEPI:2,UWBC:2,URBC:2,UBAC:2,CAST:2,CRYS:2,UCOM:2,BILUA:2 in the last 72 hours  MICROBIOLOGY: Recent Results (from the past 240 hour(s))  MRSA PCR SCREENING     Status: Abnormal   Collection Time   12/02/11  3:03 AM      Component Value Range Status Comment   MRSA by PCR POSITIVE (*) NEGATIVE Final     RADIOLOGY STUDIES/RESULTS: Dg Chest 2 View  12/01/2011  *RADIOLOGY REPORT*  Clinical Data: Shortness of breath for 1 week.  COPD.  Type 2 diabetes.  CHEST - 2 VIEW  Comparison: 10/31/2011  Findings: Lateral view degraded by patient arm position.  Midline trachea.  Normal heart size and mediastinal contours. Blunting of the costophrenic angles on the frontal is felt to be due to pleural thickening. No pneumothorax.  Mild interstitial thickening.  More focal linear opacity in the right suprahilar region/right upper lobe is most consistent with scarring.  The questioned nodular density within the lateral right upper lobe on the prior plain film is not readily apparent.  Prominent anterior 1st right rib versus right upper lobe scarring more  medially. Similar to 10/09/2009. No lobar consolidation.  There is chronic volume loss at the left lung base.  IMPRESSION:  1.  COPD/chronic bronchitis without acute superimposed process. 2.  Foci of right upper lobe scarring.  The nodular density questioned on 10/31/2011 is not readily apparent.  Please see that report and consider further evaluation with chest CT to exclude nodule in this area.  Original Report Authenticated By: Consuello Bossier, M.D.   Ct Angio Chest W/cm &/or Wo Cm  12/01/2011  *RADIOLOGY REPORT*  Clinical Data: Chest pain, shortness of breath and tachycardia.  CT ANGIOGRAPHY CHEST  Technique:  Multidetector CT imaging of the chest using the standard protocol during bolus administration of intravenous contrast. Multiplanar reconstructed images including MIPs were obtained and reviewed to evaluate the vascular anatomy.  Contrast: OMNIPAQUE IOHEXOL 350 MG/ML SOLN  Comparison: Chest radiograph performed earlier today at 03:44 p.m., and CT of the chest performed 07/13/2008  Findings: There is no evidence of pulmonary embolus.  Mild focal opacity at the right lung base likely reflects sequelae of prior infection, given appearance on the prior CT of the chest. There is also mild scarring within the right upper lobe.  Mild emphysematous change is noted at the lung apices.  There is no evidence of pleural effusion or pneumothorax.  No masses are identified; no abnormal focal contrast enhancement is seen.  The mediastinum is unremarkable in appearance.  No mediastinal lymphadenopathy is seen.  No pericardial effusion is identified. The great vessels are unremarkable in appearance.  No axillary lymphadenopathy is seen.  The visualized portions of the thyroid gland are unremarkable in appearance.  The visualized portions of the liver and spleen are unremarkable.  No acute osseous abnormalities are seen.  Significant degenerative change is noted at the right humeral head, with associated  fragmentation.  IMPRESSION:  1.  No evidence of pulmonary embolus. 2.  Mild focal opacity at the right lung base likely reflects sequelae of remote infection, given appearance on the prior CT of the chest.  Mild scarring within the right upper lobe. 3.  Mild emphysematous change at the lung apices. 4.  Significant degenerative change at the right humeral head, with associated fragmentation.  Original Report Authenticated By: Tonia Ghent, M.D.    MEDICATIONS: Scheduled Meds:   . albuterol  2.5 mg Nebulization Once  . albuterol  5 mg Nebulization Once  . albuterol  5 mg Nebulization Once  . albuterol      . albuterol      . albuterol      . aspirin  81 mg Oral Q M,W,F  . atorvastatin  20 mg Oral q1800  . budesonide  0.5 mg Nebulization BID  . Chlorhexidine Gluconate Cloth  6 each Topical Q0600  . cholecalciferol  1,000 Units Oral Daily  .  citalopram  20 mg Oral Daily  . diltiazem  180 mg Oral BID  . enoxaparin (LOVENOX) injection  40 mg Subcutaneous Q24H  . ferrous sulfate  325 mg Oral Q M,W,F  . furosemide  20 mg Oral Daily  . guaiFENesin  1,200 mg Oral Q1200  . guaiFENesin  600 mg Oral BID  . insulin aspart  0-9 Units Subcutaneous TID WC  . ipratropium  0.5 mg Nebulization Once  . ipratropium  0.5 mg Nebulization Once  . ipratropium  0.5 mg Nebulization Q4H  . iron polysaccharides  150 mg Oral BID  . levalbuterol  0.63 mg Nebulization Q4H  . levofloxacin (LEVAQUIN) IV  500 mg Intravenous Once  . levofloxacin (LEVAQUIN) IV  500 mg Intravenous Q24H  . methylPREDNISolone (SOLU-MEDROL) injection  125 mg Intravenous Once  . methylPREDNISolone (SOLU-MEDROL) injection  40 mg Intravenous Q12H  . moxifloxacin  400 mg Intravenous Once  . multivitamin with minerals  1 tablet Oral Daily  . mupirocin ointment  1 application Nasal BID  . pantoprazole  40 mg Oral Q1200  . sodium chloride  3 mL Intravenous Q12H  . white petrolatum      . zafirlukast  10 mg Oral BID  . DISCONTD: guaiFENesin   600-1,200 mg Oral TID  . DISCONTD: ipratropium  0.5 mg Nebulization Q6H  . DISCONTD: levalbuterol  0.63 mg Nebulization Q6H  . DISCONTD: sodium chloride  3 mL Intravenous Q12H  . DISCONTD: zafirlukast  10 mg Oral BID   Continuous Infusions:  PRN Meds:.acetaminophen, acetaminophen, HYDROcodone-acetaminophen, iohexol, levalbuterol, LORazepam, ondansetron (ZOFRAN) IV, ondansetron  Antibiotics: Anti-infectives     Start     Dose/Rate Route Frequency Ordered Stop   12/03/11 0000   levofloxacin (LEVAQUIN) IVPB 500 mg        500 mg 100 mL/hr over 60 Minutes Intravenous Every 24 hours 12/02/11 0111     12/02/11 0115   levofloxacin (LEVAQUIN) IVPB 500 mg        500 mg 100 mL/hr over 60 Minutes Intravenous  Once 12/02/11 0111 12/02/11 0247   12/01/11 1545   moxifloxacin (AVELOX) IVPB 400 mg        400 mg 250 mL/hr over 60 Minutes Intravenous  Once 12/01/11 1530 12/01/11 1700          Assessment/Plan: Active Problems: COPD with exacerbation -seems essentially unchanged than yesterday-patient reports little clinical improvement -Continue with Steroids, change Nebs to every 4 hours. 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

## 2011-12-02 NOTE — Consult Note (Signed)
Reason for Consult: Dyspnea with exertion  Requesting Physician: [facility]  HPI: This is a 61 y.o. female with a past medical history significant for sever, O2 dependent COPD. She has been seen by [place] in the past. She had normal coronaries in 2009 at cath. Echo in 2010 showed Nl to hyperdynamic LVF.  She has had multiple admissions for COPD exacerbations. She has had MAT during her hospitalizations. We just saw her in the office 11/10/11,  by Dr Alanda Amass and myself. Her medications were adjusted but she was in NSR. An echo was ordered but she has since been admitted to the hospital for complaints of DOE. She says she is not leaving till we get her "straightened out". Echo was done today and results are pending.  PMHx:  Past Medical History  Diagnosis Date  . Other diseases of vocal cords   . Osteoporosis, unspecified   . Other and unspecified hyperlipidemia   . Esophageal reflux   . Chronic airway obstruction, not elsewhere classified   . Regional enteritis of unspecified site   . 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 Type II or unspecified type diabetes mellitus without mention of complication, not stated as uncontrolled   . Crohn's disease     Ileostomy  . Fibroid   . Pelvic adhesions   . Anxiety   . Depression   . Hypertension   . Shortness of breath   . Pneumonia   . Asthma    Past Surgical History  Procedure Date  . Ileostomy   . Vaginal hysterectomy 1993    [facility]-LSO  . Ear cyst excision   . Cervical cone biopsy 1978  . Colon surgery 2007    Resection-Ileostomy  . Esophagus stretch   . Oophorectomy     LSO    FAMHx: Family History  Problem Relation Age of Onset  . Osteoporosis Mother   . Hypertension Sister   . Stroke Sister   . Diabetes Sister     SOCHx:  reports that she quit smoking about 8 years ago. She has never used smokeless tobacco. She reports that she drinks alcohol. Her drug history not on file.  ALLERGIES: Allergies    Allergen Reactions  . Esomeprazole Magnesium Other (See Comments)    NEXIUM - reaction > aggravated pt's Crohn's disease  . Other     Beans, Dander/Dust, Peas, Mushrooms  . Peanut-Containing Drug Products Other (See Comments)    unknown  . Shellfish Allergy Other (See Comments)    unknown    ROS: Pertinent items are noted in HPI.  HOME MEDICATIONS: Prescriptions prior to admission  Medication Sig Dispense Refill  . albuterol (PROVENTIL HFA;VENTOLIN HFA) 108 (90 BASE) MCG/ACT inhaler Inhale 2 puffs into the lungs every 6 (six) hours as needed. For shortness of breath      . aspirin 81 MG chewable tablet Chew 81 mg by mouth every Monday, Wednesday, and Friday.      . budesonide (PULMICORT) 0.25 MG/2ML nebulizer solution Take 0.25 mg by nebulization 2 (two) times daily.      . Cholecalciferol (VITAMIN D) 1000 UNITS capsule Take 1,000 Units by mouth daily.        . citalopram (CELEXA) 20 MG tablet Take 20 mg by mouth daily.        [name] diltiazem (CARDIZEM CD) 180 MG 24 hr capsule Take 180 mg by mouth 2 (two) times daily.       . folic acid (FOLVITE) 1 MG tablet Take 1 mg  by mouth daily.      . furosemide (LASIX) 20 MG tablet Take 20 mg by mouth daily as needed. For fluid retention      . guaiFENesin (MUCINEX) 600 MG 12 hr tablet Take 600-1,200 mg by mouth 3 (three) times daily. 1 tablet in the am, 2 tablet at lunch, 1 tablet in the evening.      [name] HYDROcodone-acetaminophen (VICODIN) 5-500 MG per tablet Take 1 tablet by mouth every 6 (six) hours as needed. For pain      . iron polysaccharides (NIFEREX) 150 MG capsule Take 150 mg by mouth 2 (two) times daily.      [name] levofloxacin (LEVAQUIN) 750 MG tablet Take 750 mg by mouth daily.      . Liraglutide (VICTOZA) 18 MG/3ML SOLN 1.8 units once daily      . Multiple Vitamin (MULTIVITAMIN WITH MINERALS) TABS Take 1 tablet by mouth daily.      [name] omeprazole (PRILOSEC) 40 MG capsule Take 40 mg by mouth 2 (two) times daily.       . rosuvastatin  (CRESTOR) 10 MG tablet Take 10 mg by mouth daily.        [name] tiotropium (SPIRIVA) 18 MCG inhalation capsule Place 18 mcg into inhaler and inhale daily.      . zafirlukast (ACCOLATE) 10 MG tablet Take 10 mg by mouth 2 (two) times daily.        HOSPITAL MEDICATIONS: I have reviewed the patient's current medications.  VITALS: Blood pressure 112/68, pulse 105, temperature 98.3 F (36.8 C), temperature source Oral, resp. rate 24, height 5\' 1"  (1.549 m), weight 61.417 kg (135 lb 6.4 oz), SpO2 94.00%.  PHYSICAL EXAM: General appearance: alert, cooperative and no distress Neck: no carotid bruit, no JVD, supple, symmetrical, trachea midline and thyroid not enlarged, symmetric, no tenderness/mass/nodules Lungs: expiratory wheezing bilat Heart: regular rate and rhythm Abdomen: surgical scar midline, colostomy, umbilical hernia Extremities: extremities normal, atraumatic, no cyanosis or edema Pulses: 2+ and symmetric Skin: cool and dry Neurologic: Grossly normal  LABS: Results for orders placed during the hospital encounter of 12/01/11 (from the past 48 hour(s))  CBC     Status: Abnormal   Collection Time   12/01/11  3:30 PM      Component Value Range Comment   WBC 5.3  4.0 - 10.5 K/uL    RBC 3.88  3.87 - 5.11 MIL/uL    Hemoglobin 10.6 (*) 12.0 - 15.0 g/dL    HCT 40.9 (*) 81.1 - 46.0 %    MCV 86.9  78.0 - 100.0 fL    MCH 27.3  26.0 - 34.0 pg    MCHC 31.5  30.0 - 36.0 g/dL    RDW 91.4  78.2 - 95.6 %    Platelets 379  150 - 400 K/uL   BASIC METABOLIC PANEL     Status: Abnormal   Collection Time   12/01/11  3:30 PM      Component Value Range Comment   Sodium 143  135 - 145 mEq/L    Potassium 3.8  3.5 - 5.1 mEq/L    Chloride 104  96 - 112 mEq/L    CO2 30  19 - 32 mEq/L    Glucose, Bld 109 (*) 70 - 99 mg/dL    BUN 6  6 - 23 mg/dL    Creatinine, Ser 2.13  0.50 - 1.10 mg/dL    Calcium 9.7  8.4 - 08.6 mg/dL    GFR calc non Af Denyse Dago  69 (*) >90 mL/min    GFR calc Af Amer 79 (*) >90 mL/min     PROTIME-INR     Status: Normal   Collection Time   12/01/11  3:30 PM      Component Value Range Comment   Prothrombin Time 14.2  11.6 - 15.2 seconds    INR 1.08  0.00 - 1.49   APTT     Status: Normal   Collection Time   12/01/11  3:30 PM      Component Value Range Comment   aPTT 31  24 - 37 seconds   TYPE AND SCREEN     Status: Normal   Collection Time   12/01/11  3:36 PM      Component Value Range Comment   ABO/RH(D) B POS      Antibody Screen POS      Sample Expiration 12/04/2011      PT AG Type        Value: NEGATIVE FOR DUFFY A ANTIGEN NEGATIVE FOR N ANTIGEN NEGATIVE FOR S ANTIGEN NEGATIVE FOR KELL ANTIGEN NEGATIVE FOR C ANTIGEN NEGATIVE FOR E ANTIGEN POSITIVE FOR c ANTIGEN POSITIVE FOR s ANTIGEN POSITIVE FOR e ANTIGEN POSITIVE FOR KIDD B ANTIGEN POSITIVE      FOR KIDD A ANTIGEN POSITIVE FOR M ANTIGEN   Antibody Identification NO CLINICALLY SIGNIFICANT ANTIBODY IDENTIFIED.     GLUCOSE, CAPILLARY     Status: Abnormal   Collection Time   12/02/11 12:49 AM      Component Value Range Comment   Glucose-Capillary 150 (*) 70 - 99 mg/dL    Comment 1 Notify RN      Comment 2 Documented in Chart     PRO B NATRIURETIC PEPTIDE     Status: Normal   Collection Time   12/02/11  1:01 AM      Component Value Range Comment   Pro B Natriuretic peptide (BNP) 89.7  0 - 125 pg/mL   CARDIAC PANEL(CRET KIN+CKTOT+MB+TROPI)     Status: Normal   Collection Time   12/02/11  1:01 AM      Component Value Range Comment   Total CK 65  7 - 177 U/L    CK, MB 2.0  0.3 - 4.0 ng/mL    Troponin I <0.30  <0.30 ng/mL    Relative Index RELATIVE INDEX IS INVALID  0.0 - 2.5   CBC     Status: Abnormal   Collection Time   12/02/11  1:01 AM      Component Value Range Comment   WBC 3.5 (*) 4.0 - 10.5 K/uL WHITE COUNT CONFIRMED ON SMEAR   RBC 3.67 (*) 3.87 - 5.11 MIL/uL    Hemoglobin 10.0 (*) 12.0 - 15.0 g/dL    HCT 16.1 (*) 09.6 - 46.0 %    MCV 86.4  78.0 - 100.0 fL    MCH 27.2  26.0 - 34.0 pg    MCHC 31.5  30.0 - 36.0  g/dL    RDW 04.5  40.9 - 81.1 %    Platelets    150 - 400 K/uL    Value: PLATELET CLUMPS NOTED ON SMEAR, COUNT APPEARS ADEQUATE  CREATININE, SERUM     Status: Abnormal   Collection Time   12/02/11  1:01 AM      Component Value Range Comment   Creatinine, Ser 0.86  0.50 - 1.10 mg/dL    GFR calc non Af Amer 71 (*) >90 mL/min    GFR calc Af Denyse Dago  83 (*) >90 mL/min   TSH     Status: Abnormal   Collection Time   12/02/11  1:01 AM      Component Value Range Comment   TSH 0.080 (*) 0.350 - 4.500 uIU/mL   T4, FREE     Status: Normal   Collection Time   12/02/11  1:01 AM      Component Value Range Comment   Free T4 1.02  0.80 - 1.80 ng/dL   T3, FREE     Status: Normal   Collection Time   12/02/11  1:01 AM      Component Value Range Comment   T3, Free 3.1  2.3 - 4.2 pg/mL   MRSA PCR SCREENING     Status: Abnormal   Collection Time   12/02/11  3:03 AM      Component Value Range Comment   MRSA by PCR POSITIVE (*) NEGATIVE   COMPREHENSIVE METABOLIC PANEL     Status: Abnormal   Collection Time   12/02/11  5:50 AM      Component Value Range Comment   Sodium 139  135 - 145 mEq/L    Potassium 3.8  3.5 - 5.1 mEq/L    Chloride 103  96 - 112 mEq/L    CO2 22  19 - 32 mEq/L    Glucose, Bld 152 (*) 70 - 99 mg/dL    BUN 8  6 - 23 mg/dL    Creatinine, Ser 4.09  0.50 - 1.10 mg/dL    Calcium 9.9  8.4 - 81.1 mg/dL    Total Protein 6.6  6.0 - 8.3 g/dL    Albumin 2.8 (*) 3.5 - 5.2 g/dL    AST 18  0 - 37 U/L    ALT 13  0 - 35 U/L    Alkaline Phosphatase 72  39 - 117 U/L    Total Bilirubin 0.1 (*) 0.3 - 1.2 mg/dL    GFR calc non Af Amer 74 (*) >90 mL/min    GFR calc Af Amer 85 (*) >90 mL/min   CBC WITH DIFFERENTIAL     Status: Abnormal   Collection Time   12/02/11  5:50 AM      Component Value Range Comment   WBC 4.1  4.0 - 10.5 K/uL    RBC 3.53 (*) 3.87 - 5.11 MIL/uL    Hemoglobin 9.9 (*) 12.0 - 15.0 g/dL    HCT 91.4 (*) 78.2 - 46.0 %    MCV 86.1  78.0 - 100.0 fL    MCH 28.0  26.0 - 34.0 pg    MCHC  32.6  30.0 - 36.0 g/dL    RDW 95.6  21.3 - 08.6 %    Platelets 368  150 - 400 K/uL    Neutrophils Relative 83 (*) 43 - 77 %    Neutro Abs 3.4  1.7 - 7.7 K/uL    Lymphocytes Relative 15  12 - 46 %    Lymphs Abs 0.6 (*) 0.7 - 4.0 K/uL    Monocytes Relative 2 (*) 3 - 12 %    Monocytes Absolute 0.1  0.1 - 1.0 K/uL    Eosinophils Relative 0  0 - 5 %    Eosinophils Absolute 0.0  0.0 - 0.7 K/uL    Basophils Relative 0  0 - 1 %    Basophils Absolute 0.0  0.0 - 0.1 K/uL   GLUCOSE, CAPILLARY     Status: Abnormal   Collection Time  12/02/11  7:13 AM      Component Value Range Comment   Glucose-Capillary 136 (*) 70 - 99 mg/dL   GLUCOSE, CAPILLARY     Status: Abnormal   Collection Time   12/02/11  9:08 AM      Component Value Range Comment   Glucose-Capillary 139 (*) 70 - 99 mg/dL    Comment 1 Documented in Chart      Comment 2 Notify RN     GLUCOSE, CAPILLARY     Status: Abnormal   Collection Time   12/02/11 11:39 AM      Component Value Range Comment   Glucose-Capillary 204 (*) 70 - 99 mg/dL     IMAGING: Dg Chest 2 View  12/01/2011  *RADIOLOGY REPORT*  Clinical Data: Shortness of breath for 1 week.  COPD.  Type 2 diabetes.  CHEST - 2 VIEW  Comparison: 10/31/2011  Findings: Lateral view degraded by patient arm position.  Midline trachea.  Normal heart size and mediastinal contours. Blunting of the costophrenic angles on the frontal is felt to be due to pleural thickening. No pneumothorax.  Mild interstitial thickening.  More focal linear opacity in the right suprahilar region/right upper lobe is most consistent with scarring.  The questioned nodular density within the lateral right upper lobe on the prior plain film is not readily apparent.  Prominent anterior 1st right rib versus right upper lobe scarring more medially. Similar to 10/09/2009. No lobar consolidation.  There is chronic volume loss at the left lung base.  IMPRESSION:  1.  COPD/chronic bronchitis without acute superimposed process. 2.   Foci of right upper lobe scarring.  The nodular density questioned on 10/31/2011 is not readily apparent.  Please see that report and consider further evaluation with chest CT to exclude nodule in this area.  Original Report Authenticated By: Consuello Bossier, M.D.   Ct Angio Chest W/cm &/or Wo Cm  12/01/2011  *RADIOLOGY REPORT*  Clinical Data: Chest pain, shortness of breath and tachycardia.  CT ANGIOGRAPHY CHEST  Technique:  Multidetector CT imaging of the chest using the standard protocol during bolus administration of intravenous contrast. Multiplanar reconstructed images including MIPs were obtained and reviewed to evaluate the vascular anatomy.  Contrast: OMNIPAQUE IOHEXOL 350 MG/ML SOLN  Comparison: Chest radiograph performed earlier today at 03:44 p.m., and CT of the chest performed 07/13/2008  Findings: There is no evidence of pulmonary embolus.  Mild focal opacity at the right lung base likely reflects sequelae of prior infection, given appearance on the prior CT of the chest. There is also mild scarring within the right upper lobe.  Mild emphysematous change is noted at the lung apices.  There is no evidence of pleural effusion or pneumothorax.  No masses are identified; no abnormal focal contrast enhancement is seen.  The mediastinum is unremarkable in appearance.  No mediastinal lymphadenopathy is seen.  No pericardial effusion is identified. The great vessels are unremarkable in appearance.  No axillary lymphadenopathy is seen.  The visualized portions of the thyroid gland are unremarkable in appearance.  The visualized portions of the liver and spleen are unremarkable.  No acute osseous abnormalities are seen.  Significant degenerative change is noted at the right humeral head, with associated fragmentation.  IMPRESSION:  1.  No evidence of pulmonary embolus. 2.  Mild focal opacity at the right lung base likely reflects sequelae of remote infection, given appearance on the prior CT of the chest.   Mild scarring within the right upper lobe.  3.  Mild emphysematous change at the lung apices. 4.  Significant degenerative change at the right humeral head, with associated fragmentation.  Original Report Authenticated By: Tonia Ghent, M.D.    IMPRESSION: Principal Problem:  *COPD exacerbation Active Problems:  COPD  MULTIFOCAL ATRIAL TACHYCARDIA  Anxiety disorder  Palpitations  DIABETES, TYPE 2  HYPERLIPIDEMIA  DEPRESSIVE DISORDER NOT ELSEWHERE CLASSIFIED  GERD  CROHN'S DISEASE  Normal coronary arteries 2009, nl-hyperdynamic LVF 2D 2010  Low TSH level, 0.08 with T3/T4 WNL   RECOMMENDATION: MD to see, consider adding low dose Metoprolol.  Time Spent Directly with Patient: 45 minutes  KILROY,LUKE K 12/02/2011, 2:40 PM   Agree with note written by Corine Shelter [facility]  Pt of Dr. Royann Shivers with h/o pulm fibrosis/COPD followed by Dr. [name]. H/O MAT on Dilt and BB in past. Nl LV function in past (2D pending). Admitted with increasing SOB and tachycardia. Exam notable for expiratory wheezes, otherwise nl. Currently on Dilt 180 BID. Will add low dose lopressor. Need to watch out for bronchospasm but I'm not sure we'll be able to slow her down otherwise.  Runell Gess 12/02/2011 3:52 PM

## 2011-12-02 NOTE — Progress Notes (Signed)
CRITICAL VALUE ALERT  Critical value received:  Positive MRSA swab  Date of notification:  12/02/2011  Time of notification:  0518  Critical value read back:yes  Nurse who received alert:  Greggory Brandy  Standing MRSA positive orders initiated.   Heron Nay RN BSN

## 2011-12-03 DIAGNOSIS — R946 Abnormal results of thyroid function studies: Secondary | ICD-10-CM

## 2011-12-03 DIAGNOSIS — J9620 Acute and chronic respiratory failure, unspecified whether with hypoxia or hypercapnia: Secondary | ICD-10-CM

## 2011-12-03 DIAGNOSIS — R000 Tachycardia, unspecified: Secondary | ICD-10-CM

## 2011-12-03 LAB — BASIC METABOLIC PANEL
CO2: 27 mEq/L (ref 19–32)
Calcium: 9.5 mg/dL (ref 8.4–10.5)
Chloride: 108 mEq/L (ref 96–112)
Creatinine, Ser: 0.81 mg/dL (ref 0.50–1.10)
GFR calc Af Amer: 89 mL/min — ABNORMAL LOW (ref >90)
Sodium: 145 mEq/L (ref 135–145)

## 2011-12-03 LAB — CBC
Platelets: 379 10*3/uL (ref 150–400)
RBC: 3.54 MIL/uL — ABNORMAL LOW (ref 3.87–5.11)
RDW: 14.1 % (ref 11.5–15.5)
WBC: 16.1 10*3/uL — ABNORMAL HIGH (ref 4.0–10.5)

## 2011-12-03 LAB — GLUCOSE, CAPILLARY: Glucose-Capillary: 127 mg/dL — ABNORMAL HIGH (ref 70–99)

## 2011-12-03 NOTE — Progress Notes (Signed)
Subjective:  Better today, less SOB  Objective:  Vital Signs in the last 24 hours: Temp:  [98.6 F (37 C)-99.1 F (37.3 C)] 98.6 F (37 C) (07/10 0601) Pulse Rate:  [105-129] 105  (07/10 0601) Resp:  [18] 18  (07/10 0601) BP: (112-139)/(68-77) 133/77 mmHg (07/10 0601) SpO2:  [63 %-98 %] 96 % (07/10 0601)  Intake/Output from previous day:  Intake/Output Summary (Last 24 hours) at 12/03/11 0817 Last data filed at 12/02/11 2119  Gross per 24 hour  Intake    520 ml  Output   1275 ml  Net   -755 ml    Physical Exam: General appearance: alert, cooperative and no distress Lungs: decreased breath sounds, no obvious wheezing this am Heart: regular rate and rhythm   Rate: 115  Rhythm: sinus tachycardia  Lab Results:  Basename 12/03/11 0510 12/02/11 0550  WBC 16.1* 4.1  HGB 10.0* 9.9*  PLT 379 368    Basename 12/03/11 0510 12/02/11 0550  NA 145 139  K 4.0 3.8  CL 108 103  CO2 27 22  GLUCOSE 136* 152*  BUN 14 8  CREATININE 0.81 0.84    Basename 12/02/11 0101  TROPONINI <0.30   Hepatic Function Panel  Basename 12/02/11 0550  PROT 6.6  ALBUMIN 2.8*  AST 18  ALT 13  ALKPHOS 72  BILITOT 0.1*  BILIDIR --  IBILI --   No results found for this basename: CHOL in the last 72 hours  Basename 12/01/11 1530  INR 1.08    Imaging: Imaging results have been reviewed  Cardiac Studies:  Assessment/Plan:   Principal Problem:  *COPD exacerbation Active Problems:  COPD  MULTIFOCAL ATRIAL TACHYCARDIA  Anxiety disorder  Palpitations  DIABETES, TYPE 2  HYPERLIPIDEMIA  DEPRESSIVE DISORDER NOT ELSEWHERE CLASSIFIED  GERD  CROHN'S DISEASE  Normal coronary arteries 2009, nl-hyperdynamic LVF 2D 2010  Low TSH level, 0.08 with T3/T4 WNL  Plan-Echo shows Nl-hyperdynamic LVF, nl RVF, mod TR. She has had one dose of Metoprolol so far, will continue to watch for increased wheezing. ? Any possible benefit   from switching Diltiazem to Verapamil with hyperdynamic LV-will  discuss with MD.    Corine Shelter PA-C 12/03/2011, 8:17 AM

## 2011-12-03 NOTE — Consult Note (Signed)
Name: [name] MRN: [id number] DOB: Jun 26, 1950    LOS: 2  Referring Provider:  [facility] Reason for Referral:  COPD exac  PULMONARY / CRITICAL CARE MEDICINE  HPI:  This is a 61 year old African American female with golds stage D COPD and associated asthmatic bronchitic component.  The patient was admitted on 12/01/2011 for COPD exacerbation. CT scan of the chest and chest x-ray did not show acute process. The patient slowly improving on steroids and antibiotics. The hospitalist notified pulmonary on a courtesy basis. The patient has been frequently admitted this year. Then 3 admissions in the last 6 months.   Past Medical History  Diagnosis Date  . Other diseases of vocal cords   . Osteoporosis, unspecified   . Other and unspecified hyperlipidemia   . Esophageal reflux   . Chronic airway obstruction, not elsewhere classified   . Regional enteritis of unspecified site   . 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 Type II or unspecified type diabetes mellitus without mention of complication, not stated as uncontrolled   . Crohn's disease     Ileostomy  . Fibroid   . Pelvic adhesions   . Anxiety   . Depression   . Hypertension   . Shortness of breath   . Pneumonia   . Asthma    Past Surgical History  Procedure Date  . Ileostomy   . Vaginal hysterectomy 1993    LAVH-LSO  . Ear cyst excision   . Cervical cone biopsy 1978  . Colon surgery 2007    Resection-Ileostomy  . Esophagus stretch   . Oophorectomy     LSO   Prior to Admission medications   Medication Sig Start Date End Date Taking? Authorizing Provider  albuterol (PROVENTIL HFA;VENTOLIN HFA) 108 (90 BASE) MCG/ACT inhaler Inhale 2 puffs into the lungs every 6 (six) hours as needed. For shortness of breath 11/17/11 11/16/12 Yes Tammy S Parrett, NP  aspirin 81 MG chewable tablet Chew 81 mg by mouth every Monday, Wednesday, and Friday. 11/03/11 11/02/12 Yes Hollice Espy, MD  budesonide (PULMICORT)  0.25 MG/2ML nebulizer solution Take 0.25 mg by nebulization 2 (two) times daily. 11/17/11 11/16/12 Yes Tammy S Parrett, NP  Cholecalciferol (VITAMIN D) 1000 UNITS capsule Take 1,000 Units by mouth daily.     Yes Historical Provider, MD  citalopram (CELEXA) 20 MG tablet Take 20 mg by mouth daily.     Yes Historical Provider, MD  diltiazem (CARDIZEM CD) 180 MG 24 hr capsule Take 180 mg by mouth 2 (two) times daily.    Yes Historical Provider, MD  folic acid (FOLVITE) 1 MG tablet Take 1 mg by mouth daily.   Yes Historical Provider, MD  furosemide (LASIX) 20 MG tablet Take 20 mg by mouth daily as needed. For fluid retention   Yes Historical Provider, MD  guaiFENesin (MUCINEX) 600 MG 12 hr tablet Take 600-1,200 mg by mouth 3 (three) times daily. 1 tablet in the am, 2 tablet at lunch, 1 tablet in the evening.   Yes Historical Provider, MD  HYDROcodone-acetaminophen (VICODIN) 5-500 MG per tablet Take 1 tablet by mouth every 6 (six) hours as needed. For pain   Yes Historical Provider, MD  iron polysaccharides (NIFEREX) 150 MG capsule Take 150 mg by mouth 2 (two) times daily.   Yes Historical Provider, MD  levofloxacin (LEVAQUIN) 750 MG tablet Take 750 mg by mouth daily. 11/30/11 12/07/11 Yes Barbaraann Share, MD  Liraglutide (VICTOZA) 18 MG/3ML SOLN 1.8 units once daily  Yes Historical Provider, MD  Multiple Vitamin (MULTIVITAMIN WITH MINERALS) TABS Take 1 tablet by mouth daily.   Yes Historical Provider, MD  omeprazole (PRILOSEC) 40 MG capsule Take 40 mg by mouth 2 (two) times daily.    Yes Historical Provider, MD  rosuvastatin (CRESTOR) 10 MG tablet Take 10 mg by mouth daily.     Yes Historical Provider, MD  tiotropium (SPIRIVA) 18 MCG inhalation capsule Place 18 mcg into inhaler and inhale daily. 11/18/11  Yes Tammy S Parrett, NP  zafirlukast (ACCOLATE) 10 MG tablet Take 10 mg by mouth 2 (two) times daily. 11/18/11 11/17/12 Yes Tammy [employer], NP   Allergies Allergies  Allergen Reactions  . Esomeprazole  Magnesium Other (See Comments)    NEXIUM - reaction > aggravated pt's Crohn's disease  . Other     Beans, Dander/Dust, Peas, Mushrooms  . Peanut-Containing Drug Products Other (See Comments)    unknown  . Shellfish Allergy Other (See Comments)    unknown    Family History Family History  Problem Relation Age of Onset  . Osteoporosis Mother   . Hypertension Sister   . Stroke Sister   . Diabetes Sister    Social History  reports that she quit smoking about 8 years ago. She has never used smokeless tobacco. She reports that she drinks alcohol. Her drug history not on file.  Review Of Systems:  11 point review of systems taken in detail and is negative except as per history present illness  Brief patient description:  61 year old African American female with COPD exacerbation  Events Since Admission: CT scan of the chest on 12/01/2011 shows no pulmonary emboli  Current Status: Patient is slowly improving with less wheezing cough and dyspnea  Vital Signs: Temp:  [98.6 F (37 C)-99.1 F (37.3 C)] 98.6 F (37 C) (07/10 0601) Pulse Rate:  [105-129] 105  (07/10 0601) Resp:  [18] 18  (07/10 0601) BP: (123-139)/(71-77) 133/77 mmHg (07/10 0601) SpO2:  [63 %-98 %] 96 % (07/10 0601)  Physical Examination: General:  No acute distress Neuro:  Awake and alert note neuro deficit HEENT:  Moist mucous membranes Neck:  Supple no thyromegaly no jugular venous distention Cardiovascular:  Regular rate and rhythm normal S1-S2 no S3 no S4 no murmur rub heave or gallop Lungs:  Distant breath sounds expired wheezes Abdomen:  Soft nontender bowel sounds active organomegaly Musculoskeletal:  Full range of motion without joint deformity Skin:  Clear  Principal Problem:  *COPD exacerbation Active Problems:  DIABETES, TYPE 2  HYPERLIPIDEMIA  DEPRESSIVE DISORDER NOT ELSEWHERE CLASSIFIED  MULTIFOCAL ATRIAL TACHYCARDIA  COPD  GERD  CROHN'S DISEASE  Anxiety disorder  Palpitations  Normal  coronary arteries 2009, nl-hyperdynamic LVF 2D 2010  Low TSH level, 0.08 with T3/T4 WNL   ASSESSMENT AND PLAN  PULMONARY No results found for this basename: PHART:5,PCO2:5,PCO2ART:5,PO2ART:5,HCO3:5,O2SAT:5 in the last 168 hours Ventilator Settings: Patient is on nasal oxygen   CXR:  COPD changes without acute infiltrate ETT:  None  A:  COPD exacerbation golds stage IV with associated asthmatic bronchitis and some degree of reversible airflow obstruction due to asthma P:   Agree with current management Will consult [facility] care management program due to the fact the patient's had 3 admissions the last 6 months and is a Medicare patient under the direction of Dr. Allyne Gee who is a [facility] participant  CARDIOVASCULAR  Lab 12/02/11 0101  TROPONINI <0.30  LATICACIDVEN --  PROBNP 89.7   Lines: Peripheral IV  A: Hyperlipidemia  without evidence of active ischemia, or history of normal coronary artery disease P:  Per cardiology and primary team  RENAL  Lab 12/03/11 0510 12/02/11 0550 12/02/11 0101 12/01/11 1530  NA 145 139 -- 143  K 4.0 3.8 -- --  CL 108 103 -- 104  CO2 27 22 -- 30  BUN 14 8 -- 6  CREATININE 0.81 0.84 0.86 0.89  CALCIUM 9.5 9.9 -- 9.7  MG -- -- -- --  PHOS -- -- -- --   Intake/Output      07/09 0701 - 07/10 0700 07/10 0701 - 07/11 0700   P.O. 520    IV Piggyback     Total Intake(mL/kg) 520 (8.5)    Urine (mL/kg/hr) 525 (0.4)    Stool 750    Total Output 1275    Net -755         Urine Occurrence 1 x     Foley:  None  A:  No active renal P:   Monitor  GASTROINTESTINAL  Lab 12/02/11 0550  AST 18  ALT 13  ALKPHOS 72  BILITOT 0.1*  PROT 6.6  ALBUMIN 2.8*    A:  History of colitis due to regional enteritis stable at this time and associated Crohn's disease P:   Per primary team  HEMATOLOGIC  Lab 12/03/11 0510 12/02/11 0550 12/02/11 0101 12/01/11 1530  HGB 10.0* 9.9* 10.0* 10.6*  HCT 30.1* 30.4* 31.7* 33.7*  PLT 379 368  PLATELET CLUMPS NOTED ON SMEAR, COUNT APPEARS ADEQUATE 379  INR -- -- -- 1.08  APTT -- -- -- 31   A:  Mild anemia P:  Monitor  INFECTIOUS  Lab 12/03/11 0510 12/02/11 0550 12/02/11 0101 12/01/11 1530  WBC 16.1* 4.1 3.5* 5.3  PROCALCITON -- -- -- --   Cultures: None Antibiotics: Levaquin started 12/01/2011  A:  Acute tracheobronchitis without pneumonia P:   Per primary team  ENDOCRINE  Lab 12/03/11 0750 12/02/11 2114 12/02/11 1722 12/02/11 1139 12/02/11 0908  GLUCAP 134* 170* 124* 204* 139*   A:  Mild hyperglycemia   P:   per primary team  NEUROLOGIC  A: Lack of neurologic issue P:   Monitor  BEST PRACTICE / DISPOSITION Level of Care:  Floor status Primary Service:  [facility] Consultants:  PC CM, cardiology Code Status:  Full Diet:  Healthy heart diet DVT Px:  Low molecular weight heparin GI Px:  proton pump inhibitor Skin Integrity:  Clear Social / Family:  Patient able to understand her own health she issues. She is no family the room when patient was rounded upon  Shan Levans, M.D. Pulmonary and Critical Care Medicine [facility] Pager: [phone]  12/03/2011, 11:36 AM

## 2011-12-03 NOTE — Progress Notes (Signed)
Subjective: Patient feeling better, breathing better, still with wheezing. Cough better.  Objective: Filed Vitals:   12/02/11 1947 12/02/11 2118 12/03/11 0312 12/03/11 0601  BP:  139/77  133/77  Pulse:  129  105  Temp:  99.1 F (37.3 C)  98.6 F (37 C)  TempSrc:  Oral  Oral  Resp:    18  Height:      Weight:      SpO2: 95% 97% 97% 96%   Weight change:    General: Alert, awake, oriented x3, in no acute distress.  HEENT: No bruits, no goiter.  Heart: Regular rate and rhythm, without murmurs, rubs, gallops.  Lungs: Bilateral expiratory wheezes, bilateral air movement.  Abdomen: Soft, nontender, nondistended, positive bowel sounds. Colotomy bag in place.  Neuro: Grossly intact, nonfocal. Extremities: no edema.   Lab Results:  [facility] 12/03/11 0510 12/02/11 0550  NA 145 139  K 4.0 3.8  CL 108 103  CO2 27 22  GLUCOSE 136* 152*  BUN 14 8  CREATININE 0.81 0.84  CALCIUM 9.5 9.9  MG -- --  PHOS -- --    Basename 12/02/11 0550  AST 18  ALT 13  ALKPHOS 72  BILITOT 0.1*  PROT 6.6  ALBUMIN 2.8*    Basename 12/03/11 0510 12/02/11 0550  WBC 16.1* 4.1  NEUTROABS -- 3.4  HGB 10.0* 9.9*  HCT 30.1* 30.4*  MCV 85.0 86.1  PLT 379 368    Basename 12/02/11 0101  CKTOTAL 65  CKMB 2.0  CKMBINDEX --  TROPONINI <0.30    Basename 12/02/11 0101  TSH 0.080*  T4TOTAL --  T3FREE 3.1  THYROIDAB --   No results found for this basename: VITAMINB12:2,FOLATE:2,FERRITIN:2,TIBC:2,IRON:2,RETICCTPCT:2 in the last 72 hours  Micro Results: Recent Results (from the past 240 hour(s))  MRSA PCR SCREENING     Status: Abnormal   Collection Time   12/02/11  3:03 AM      Component Value Range Status Comment   MRSA by PCR POSITIVE (*) NEGATIVE Final     Studies/Results: Dg Chest 2 View  12/01/2011  *RADIOLOGY REPORT*  Clinical Data: Shortness of breath for 1 week.  COPD.  Type 2 diabetes.  CHEST - 2 VIEW  Comparison: 10/31/2011  Findings: Lateral view degraded by patient arm position.   Midline trachea.  Normal heart size and mediastinal contours. Blunting of the costophrenic angles on the frontal is felt to be due to pleural thickening. No pneumothorax.  Mild interstitial thickening.  More focal linear opacity in the right suprahilar region/right upper lobe is most consistent with scarring.  The questioned nodular density within the lateral right upper lobe on the prior plain film is not readily apparent.  Prominent anterior 1st right rib versus right upper lobe scarring more medially. Similar to 10/09/2009. No lobar consolidation.  There is chronic volume loss at the left lung base.  IMPRESSION:  1.  COPD/chronic bronchitis without acute superimposed process. 2.  Foci of right upper lobe scarring.  The nodular density questioned on 10/31/2011 is not readily apparent.  Please see that report and consider further evaluation with chest CT to exclude nodule in this area.  Original Report Authenticated By: Consuello Bossier, M.D.   Ct Angio Chest W/cm &/or Wo Cm  12/01/2011  *RADIOLOGY REPORT*  Clinical Data: Chest pain, shortness of breath and tachycardia.  CT ANGIOGRAPHY CHEST  Technique:  Multidetector CT imaging of the chest using the standard protocol during bolus administration of intravenous contrast. Multiplanar reconstructed images including MIPs were  obtained and reviewed to evaluate the vascular anatomy.  Contrast: OMNIPAQUE IOHEXOL 350 MG/ML SOLN  Comparison: Chest radiograph performed earlier today at 03:44 p.m., and CT of the chest performed 07/13/2008  Findings: There is no evidence of pulmonary embolus.  Mild focal opacity at the right lung base likely reflects sequelae of prior infection, given appearance on the prior CT of the chest. There is also mild scarring within the right upper lobe.  Mild emphysematous change is noted at the lung apices.  There is no evidence of pleural effusion or pneumothorax.  No masses are identified; no abnormal focal contrast enhancement is seen.   The mediastinum is unremarkable in appearance.  No mediastinal lymphadenopathy is seen.  No pericardial effusion is identified. The great vessels are unremarkable in appearance.  No axillary lymphadenopathy is seen.  The visualized portions of the thyroid gland are unremarkable in appearance.  The visualized portions of the liver and spleen are unremarkable.  No acute osseous abnormalities are seen.  Significant degenerative change is noted at the right humeral head, with associated fragmentation.  IMPRESSION:  1.  No evidence of pulmonary embolus. 2.  Mild focal opacity at the right lung base likely reflects sequelae of remote infection, given appearance on the prior CT of the chest.  Mild scarring within the right upper lobe. 3.  Mild emphysematous change at the lung apices. 4.  Significant degenerative change at the right humeral head, with associated fragmentation.  Original Report Authenticated By: Tonia Ghent, M.D.    Medications: I have reviewed the patient's current medications.  COPD with exacerbation  -Some improvement in breathing.  -Continue with Steroids,  Nebs to every 4 hours. Continue with Empiric Levaquin  -incentive spirometry and flutter valve  -monitor clinical course  Acute on chronic Resp Failure  -2/2 to above  -O2 as needed  Palpitations  -EKG on admit-Sinus Tach  -mild sinus tachycardia on Tele-likely 2/2 to Beta agonist use  -CTA chest on admit-neg for PE  -check TSH: low TSH but normal Free T3 and T4.  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      LOS: 2 days   Ben Sanz M.D.  [facility] 12/03/2011, 10:47 AM

## 2011-12-03 NOTE — Progress Notes (Signed)
Thank you to Dr Shan Levans for this referral.  Reviewed services with patient and live in caregiver (sister).  Patient is agreeable to assistance with COPD management.  For any additional questions or new referrals please contact Anibal Henderson BSN RN [facility] Liaison at [phone].

## 2011-12-03 NOTE — Progress Notes (Signed)
Pt. Seen and examined. Agree with the NP/PA-C note as written.  She is in sinus tachycardia this morning. Undergoing nebulizer treatment. Could consider diltiazem increases if you wish to discontinue or decrease lopressor to see if wheezing is improved somewhat.  Chrystie Nose, MD, [facility] Attending Cardiologist [facility]

## 2011-12-04 LAB — BASIC METABOLIC PANEL
BUN: 22 mg/dL (ref 6–23)
Calcium: 9.1 mg/dL (ref 8.4–10.5)
Creatinine, Ser: 0.86 mg/dL (ref 0.50–1.10)
GFR calc non Af Amer: 71 mL/min — ABNORMAL LOW (ref >90)
Glucose, Bld: 140 mg/dL — ABNORMAL HIGH (ref 70–99)
Sodium: 140 mEq/L (ref 135–145)

## 2011-12-04 LAB — CBC
Hemoglobin: 10.1 g/dL — ABNORMAL LOW (ref 12.0–15.0)
MCH: 27.4 pg (ref 26.0–34.0)
MCHC: 32.4 g/dL (ref 30.0–36.0)
MCV: 84.8 fL (ref 78.0–100.0)

## 2011-12-04 LAB — GLUCOSE, CAPILLARY: Glucose-Capillary: 203 mg/dL — ABNORMAL HIGH (ref 70–99)

## 2011-12-04 MED ORDER — PANTOPRAZOLE SODIUM 40 MG PO TBEC: 40.0000 mg | DELAYED_RELEASE_TABLET | Freq: Two times a day (BID) | ORAL | Status: DC

## 2011-12-04 MED ORDER — LEVOFLOXACIN 500 MG PO TABS
500.0000 mg | ORAL_TABLET | Freq: Every day | ORAL | Status: DC
  Administered 2011-12-04 – 2011-12-05 (×2): 500 mg via ORAL
  Filled 2011-12-04 (×2): qty 1

## 2011-12-04 MED ORDER — DM-GUAIFENESIN ER 30-600 MG PO TB12
2.0000 | ORAL_TABLET | Freq: Two times a day (BID) | ORAL | Status: DC
  Administered 2011-12-04 – 2011-12-05 (×3): 2 via ORAL
  Filled 2011-12-04 (×4): qty 2

## 2011-12-04 MED ORDER — LEVALBUTEROL HCL 0.63 MG/3ML IN NEBU
0.6300 mg | INHALATION_SOLUTION | Freq: Four times a day (QID) | RESPIRATORY_TRACT | Status: DC
  Administered 2011-12-04 – 2011-12-05 (×4): 0.63 mg via RESPIRATORY_TRACT
  Filled 2011-12-04 (×8): qty 3

## 2011-12-04 MED ORDER — TIOTROPIUM BROMIDE MONOHYDRATE 18 MCG IN CAPS
18.0000 ug | ORAL_CAPSULE | Freq: Every day | RESPIRATORY_TRACT | Status: DC
  Administered 2011-12-05: 18 ug via RESPIRATORY_TRACT
  Filled 2011-12-04: qty 5

## 2011-12-04 MED ORDER — PREDNISONE 20 MG PO TABS
40.0000 mg | ORAL_TABLET | Freq: Every day | ORAL | Status: DC
  Administered 2011-12-04 – 2011-12-05 (×2): 40 mg via ORAL
  Filled 2011-12-04 (×3): qty 2

## 2011-12-04 MED ORDER — HYDROCOD POLST-CHLORPHEN POLST 10-8 MG/5ML PO LQCR
5.0000 mL | Freq: Two times a day (BID) | ORAL | Status: DC
  Administered 2011-12-04 – 2011-12-05 (×3): 5 mL via ORAL
  Filled 2011-12-04 (×3): qty 5

## 2011-12-04 MED ORDER — FAMOTIDINE 20 MG PO TABS
20.0000 mg | ORAL_TABLET | Freq: Two times a day (BID) | ORAL | Status: DC
  Administered 2011-12-04 – 2011-12-05 (×3): 20 mg via ORAL
  Filled 2011-12-04 (×4): qty 1

## 2011-12-04 MED ORDER — IPRATROPIUM BROMIDE 0.02 % IN SOLN: 0.5000 mg | Freq: Four times a day (QID) | RESPIRATORY_TRACT | Status: DC

## 2011-12-04 NOTE — Evaluation (Signed)
Physical Therapy Evaluation Patient Details Name: [name] MRN: [id number] DOB: December 11, 1950 Today's Date: 12/04/2011 Time: 6578-4696 PT Time Calculation (min): 18 min  PT Assessment / Plan / Recommendation Clinical Impression  Pt adm with COPD exacerbation.  Pt slightly unsteady with gait probably due to decr activity while in hospital.  Pt should be able to return home and won't need any follow-up PT at home.  Will follow acutely to improve gait stability.    PT Assessment  Patient needs continued PT services    Follow Up Recommendations  No PT follow up    Barriers to Discharge        Equipment Recommendations  None recommended by PT    Recommendations for Other Services     Frequency Min 3X/week    Precautions / Restrictions Precautions Precaution Comments: O2 at home   Pertinent Vitals/Pain Amb with 2L 02 with sats at >96%.      Mobility  Transfers Transfers: Sit to Stand;Stand to Sit Sit to Stand: 6: Modified independent (Device/Increase time);From bed;With upper extremity assist Stand to Sit: 6: Modified independent (Device/Increase time);To bed;With upper extremity assist Ambulation/Gait Ambulation/Gait Assistance: 4: Min guard Ambulation Distance (Feet): 300 Feet Assistive device: 1 person hand held assist Ambulation/Gait Assistance Details: Pt slightly unsteady and reaching for wall rail Gait Pattern: Step-through pattern;Decreased stride length Gait velocity: slow cadence    Exercises     PT Diagnosis: Difficulty walking  PT Problem List: Decreased activity tolerance;Decreased balance;Decreased mobility PT Treatment Interventions: DME instruction;Gait training;Functional mobility training;Patient/family education;Therapeutic exercise;Therapeutic activities;Balance training;Stair training   PT Goals Acute Rehab PT Goals PT Goal Formulation: With patient Pt will Ambulate: >150 feet;with modified independence;with least restrictive assistive  device PT Goal: Ambulate - Progress: Goal set today Pt will Go Up / Down Stairs: 1-2 stairs;with supervision;with least restrictive assistive device PT Goal: Up/Down Stairs - Progress: Goal set today  Visit Information  Last PT Received On: 12/04/11 Assistance Needed: +1    Subjective Data  Subjective: "I'm dizzy," pt stated initially on amb Patient Stated Goal: Return home   Prior Functioning  Home Living Lives With: Family Available Help at Discharge: Family;Available PRN/intermittently Type of Home: House Home Access: Stairs to enter [employer] of Steps: 3 Home Layout: One level Bathroom Shower/Tub: Forensic scientist: Standard Home Adaptive Equipment: Walker - rolling;Bedside commode/3-in-1 (O2) Prior Function Level of Independence: Independent Able to Take Stairs?: Yes Communication Communication: No difficulties Dominant Hand: Right    Cognition  Overall Cognitive Status: Appears within functional limits for tasks assessed/performed Arousal/Alertness: Awake/alert Orientation Level: Appears intact for tasks assessed Behavior During Session: [facility] for tasks performed    Extremity/Trunk Assessment Right Upper Extremity Assessment RUE ROM/Strength/Tone: Within functional levels Left Upper Extremity Assessment LUE ROM/Strength/Tone: Within functional levels Right Lower Extremity Assessment RLE ROM/Strength/Tone: [facility] for tasks assessed Left Lower Extremity Assessment LLE ROM/Strength/Tone: [facility] for tasks assessed   Balance Static Standing Balance Static Standing - Balance Support: No upper extremity supported Static Standing - Level of Assistance: 6: Modified independent (Device/Increase time) Dynamic Standing Balance Dynamic Standing - Balance Support: No upper extremity supported Dynamic Standing - Level of Assistance: 4: Min assist  End of Session PT - End of Session Activity Tolerance: Patient tolerated treatment well (1 standing  rest break) Patient left: in bed;with call bell/phone within reach (sitting EOB) Nurse Communication: Mobility status  GP     Adine Heimann 12/04/2011, 1:51 PM  [employer] PT [phone]

## 2011-12-04 NOTE — Evaluation (Signed)
Occupational Therapy Evaluation and Discharge Patient Details Name: [name] MRN: [id number] DOB: Feb 22, 1951 Today's Date: 12/04/2011 Time: 4782-9562 OT Time Calculation (min): 28 min  OT Assessment / Plan / Recommendation Clinical Impression  This pt at an Independent level. No further OT needs post this eval and education. Will D/C from acute OT.    OT Assessment  Patient does not need any further OT services    Follow Up Recommendations  No OT follow up    Barriers to Discharge      Equipment Recommendations  None recommended by OT    Recommendations for Other Services    Frequency       Precautions / Restrictions Precautions Precaution Comments: O2 at home       ADL  Eating/Feeding: Simulated;Independent Where Assessed - Eating/Feeding: Edge of bed Grooming: Simulated;Independent Where Assessed - Grooming: Unsupported standing Upper Body Bathing: Simulated;Independent Where Assessed - Upper Body Bathing: Unsupported sitting Lower Body Bathing: Simulated;Independent Where Assessed - Lower Body Bathing: Unsupported sit to stand Upper Body Dressing: Simulated;Independent Where Assessed - Upper Body Dressing: Unsupported sitting Lower Body Dressing: Simulated;Independent Where Assessed - Lower Body Dressing: Unsupported sit to stand Toilet Transfer: Simulated;Independent Toilet Transfer Method: Sit to Barista: Regular height toilet Toileting - Clothing Manipulation and Hygiene: Simulated;Independent Where Assessed - Toileting Clothing Manipulation and Hygiene: Standing Equipment Used:  (O2 ) Transfers/Ambulation Related to ADLs: Independent ADL Comments: Assisted pt with purchasing a folding tub seat online at [employer] since she was unfamilar with how to do it, and she wanted a folding shower seat v. a non folding (standard one). Gave pt energy conservation handout and went over pursed lipped breathing with her.    OT Diagnosis:      OT Problem List:   OT Treatment Interventions:     OT Goals    Visit Information  Last OT Received On: 12/04/11 Assistance Needed: +1    Subjective Data  Subjective: I usually stay active at home Patient Stated Goal: Be able to go home   Prior Functioning  Home Living Lives With: Family Available Help at Discharge: Family;Available PRN/intermittently Bathroom Shower/Tub: Tub/shower unit;Curtain Bathroom Toilet: Standard Home Adaptive Equipment: Walker - rolling;Bedside commode/3-in-1 (O2) Prior Function Level of Independence: Independent Able to Take Stairs?: Yes Communication Communication: No difficulties Dominant Hand: Right    Cognition  Overall Cognitive Status: Appears within functional limits for tasks assessed/performed Arousal/Alertness: Awake/alert Orientation Level: Appears intact for tasks assessed Behavior During Session: [facility] for tasks performed    Extremity/Trunk Assessment Right Upper Extremity Assessment RUE ROM/Strength/Tone: Within functional levels Left Upper Extremity Assessment LUE ROM/Strength/Tone: Within functional levels   Mobility     Exercise    Balance    End of Session OT - End of Session Equipment Utilized During Treatment:  (O2) Activity Tolerance: Patient tolerated treatment well Patient left: in bed;with call bell/phone within reach (sitting EOB)       Evette Georges [phone] 12/04/2011, 10:31 AM

## 2011-12-04 NOTE — Progress Notes (Addendum)
Subjective:  Subjectively less SOB. Sinus tachycardia when up.   Objective:  Vital Signs in the last 24 hours: Temp:  [98.1 F (36.7 C)-98.7 F (37.1 C)] 98.4 F (36.9 C) (07/11 0558) Pulse Rate:  [100-125] 125  (07/11 0855) Resp:  [16-18] 16  (07/11 0558) BP: (130-144)/(81-85) 130/84 mmHg (07/11 0855) SpO2:  [97 %-99 %] 99 % (07/11 0830) Weight:  [60.7 kg (133 lb 13.1 oz)] 60.7 kg (133 lb 13.1 oz) (07/11 0558)  Intake/Output from previous day:  Intake/Output Summary (Last 24 hours) at 12/04/11 1100 Last data filed at 12/04/11 0018  Gross per 24 hour  Intake    483 ml  Output    425 ml  Net     58 ml    Physical Exam: General appearance: alert, cooperative and no distress Lungs: decreased breath sounds, no obvious wheezing. Heart: regular rate and rhythm   Rate: 95-140  Rhythm: normal sinus rhythm and No MAT  Lab Results:  Basename 12/04/11 0545 12/03/11 0510  WBC 16.7* 16.1*  HGB 10.1* 10.0*  PLT 352 379    Basename 12/04/11 0545 12/03/11 0510  NA 140 145  K 4.2 4.0  CL 103 108  CO2 27 27  GLUCOSE 140* 136*  BUN 22 14  CREATININE 0.86 0.81    Basename 12/02/11 0101  TROPONINI <0.30   Hepatic Function Panel  Basename 12/02/11 0550  PROT 6.6  ALBUMIN 2.8*  AST 18  ALT 13  ALKPHOS 72  BILITOT 0.1*  BILIDIR --  IBILI --   No results found for this basename: CHOL in the last 72 hours  Basename 12/01/11 1530  INR 1.08    Imaging: Imaging results have been reviewed  Cardiac Studies:  Echo 7/9 Reviewed:  Normal LV size & thickness.  Hyperdynamic EF ~70-75%.  Mild-Mod TR.  Increased CO with tachycardia not suggestive of elevated LAP, LVEDP  Assessment/Plan:   Principal Problem:  *COPD exacerbation Active Problems:  COPD  MULTIFOCAL ATRIAL TACHYCARDIA  Anxiety disorder  Palpitations  DIABETES, TYPE 2  HYPERLIPIDEMIA  DEPRESSIVE DISORDER NOT ELSEWHERE CLASSIFIED  GERD  CROHN'S DISEASE  Normal coronary arteries 2009, nl-hyperdynamic  LVF 2D 2010  Low TSH level, 0.08 with T3/T4 WNL  Acute-on-chronic respiratory failure  Sinus tachycardia   Plan- Persistent Sinus tachycardia, low dose beta blocker has been added with minimal effect. She is on Diltiazem 180mg  BID. Multiple admissions this year for COPD exacerbation. Her Diltiazem has been adjusted as an OP for ?orthostatic dizziness. She has severe COPD/pulm fibrosis. She had been on chronic prednisone for 79yrs, weaned off this year secondary to systemic side effects. MD to see but I'm not sure we have much more to offer.  Corine Shelter PA-C 12/04/2011, 11:00 AM  I have seen & examined the patient along with Mr. Diona Fanti.  I agree with his findings, and recommendations. MAT in the setting of recurrent COPD exacerbations is difficult to treat.  Apparently her CCB dose has been decreased due to BP issues as an OP.  With her hyperdynamic LV function, she does not appear to have issues with diastolic HF - proBNP is normal.   At this point if additional rate control is warranted - careful titration of the BB vs. Diltiazem to 240 bid can be considered.  Alternatively, we could consider verapamil. Suspect that as her pulmonary issues settle out, she will be less tachycardic.  With such a hyperdynamic LV, she may actually be volume depleted & over aggressive medication of HR may  indeed lead to orthostatic hypotension.  Would consider gentle hydration vs. Encourage increased PO fluid intake.  She has been on bisoprolol in the past which may be more tolerated than Metoprolol if wheezing worsens. She is mildly anemic (chronically) - continue iron supplementation along with Folic Acid +/- Vit B12. -- with tachycardia and anemia she could be at risk long term from high output cardiomyopathy.  Thankfully her EF is hyper as opposed to hypodynamic.  Marykay Lex, M.D., M.S. [facility] [address] [place], [place]  [address]  [phone] Pager #  [phone]  12/04/2011 2:06 PM

## 2011-12-04 NOTE — Progress Notes (Signed)
Name: [name] MRN: [id number] DOB: 1951-03-26    LOS: 3  Referring Provider:  [facility] Reason for Referral:  COPD exac  PULMONARY / CRITICAL CARE MEDICINE  HPI:  This is a 61 year old African American female with golds stage D COPD and associated asthmatic bronchitic component.  The patient was admitted on 12/01/2011 for COPD exacerbation. CT scan of the chest and chest x-ray did not show acute process. The patient slowly improving on steroids and antibiotics. The hospitalist notified pulmonary on a courtesy basis. The patient has been frequently admitted this year. Then 3 admissions in the last 6 months.   Brief patient description:  61 year old African American female with COPD exacerbation  Events Since Admission: CT scan of the chest on 12/01/2011 shows no pulmonary emboli  Current Status: Still with cough paroxysms  Vital Signs: Temp:  [98.1 F (36.7 C)-98.7 F (37.1 C)] 98.4 F (36.9 C) (07/11 0558) Pulse Rate:  [100-125] 125  (07/11 0855) Resp:  [16-18] 16  (07/11 0558) BP: (130-144)/(81-85) 130/84 mmHg (07/11 0855) SpO2:  [97 %-99 %] 99 % (07/11 0830) Weight:  [60.7 kg (133 lb 13.1 oz)] 60.7 kg (133 lb 13.1 oz) (07/11 0558)  Physical Examination: General:  No acute distress Neuro:  Awake and alert note neuro deficit HEENT:  Moist mucous membranes Neck:  Supple no thyromegaly no jugular venous distention Cardiovascular:  Regular rate and rhythm normal S1-S2 no S3 no S4 no murmur rub heave or gallop Lungs:  Distant breath sounds expired wheezes, improved from 7/10 Abdomen:  Soft nontender bowel sounds active organomegaly Musculoskeletal:  Full range of motion without joint deformity Skin:  Clear  Principal Problem:  *COPD exacerbation Active Problems:  DIABETES, TYPE 2  HYPERLIPIDEMIA  DEPRESSIVE DISORDER NOT ELSEWHERE CLASSIFIED  MULTIFOCAL ATRIAL TACHYCARDIA  COPD  GERD  CROHN'S DISEASE  Anxiety disorder  Palpitations  Normal coronary arteries  2009, nl-hyperdynamic LVF 2D 2010  Low TSH level, 0.08 with T3/T4 WNL  Acute-on-chronic respiratory failure  Sinus tachycardia   ASSESSMENT AND PLAN  PULMONARY No results found for this basename: PHART:5,PCO2:5,PCO2ART:5,PO2ART:5,HCO3:5,O2SAT:5 in the last 168 hours Ventilator Settings: Patient is on nasal oxygen   CXR:  COPD changes without acute infiltrate ETT:  None  A:  COPD exacerbation golds stage IV with associated asthmatic bronchitis and some degree of reversible airflow obstruction due to asthma P:   Flutter valve Change atrovent to spiriva Cont xopenex but at q6H Cont pulmicort Reduce to po prednisone Bid tussionex and mucinex DM for cough control PO levaquin PO pepcid (PPI will aggravate the pts inflammatory bowel disease)  CARDIOVASCULAR  Lab 12/02/11 0101  TROPONINI <0.30  LATICACIDVEN --  PROBNP 89.7   Lines: Peripheral IV  A: Hyperlipidemia without evidence of active ischemia, or history of normal coronary artery disease P:  Per cardiology and primary team  RENAL  Lab 12/04/11 0545 12/03/11 0510 12/02/11 0550 12/02/11 0101 12/01/11 1530  NA 140 145 139 -- 143  K 4.2 4.0 -- -- --  CL 103 108 103 -- 104  CO2 27 27 22  -- 30  BUN 22 14 8  -- 6  CREATININE 0.86 0.81 0.84 0.86 0.89  CALCIUM 9.1 9.5 9.9 -- 9.7  MG -- -- -- -- --  PHOS -- -- -- -- --   Intake/Output      07/10 0701 - 07/11 0700 07/11 0701 - 07/12 0700   P.O. 720    I.V. 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 at this time and associated Crohn's disease P:   Per primary team Change PPI to H2 blocker  HEMATOLOGIC  Lab 12/04/11  0545 12/03/11 0510 12/02/11 0550 12/02/11 0101 12/01/11 1530  HGB 10.1* 10.0* 9.9* 10.0* 10.6*  HCT 31.2* 30.1* 30.4* 31.7* 33.7*  PLT 352 379 368 PLATELET CLUMPS NOTED ON SMEAR, COUNT APPEARS ADEQUATE 379  INR -- -- -- -- 1.08  APTT -- -- -- -- 31   A:  Mild anemia P:  Monitor  INFECTIOUS  Lab 12/04/11 0545 12/03/11 0510 12/02/11 0550 12/02/11 0101 12/01/11 1530  WBC 16.7* 16.1* 4.1 3.5* 5.3  PROCALCITON -- -- -- -- --   Cultures: None Antibiotics: Levaquin started 12/01/2011  A:  Acute tracheobronchitis without pneumonia P:   Per primary team Change to po levaquin  ENDOCRINE  Lab 12/03/11 2147 12/03/11 1647 12/03/11 1208 12/03/11 0750 12/02/11 2114  GLUCAP 154* 167* 127* 134* 170*   A:  Mild hyperglycemia   P:   per primary team  NEUROLOGIC  A: Lack of neurologic issue P:   Monitor  BEST PRACTICE / DISPOSITION Level of Care:  Floor status Primary Service:  [facility] Consultants:  PC CM, cardiology Code Status:  Full Diet:  Healthy heart diet DVT Px:  Low molecular weight heparin GI Px:  proton pump inhibitor>>>change to PO H2 blocker Skin Integrity:  Clear Social / Family:  Patient able to understand her own health she issues. She is no family the room when patient was rounded upon  Shan Levans, M.D. Pulmonary and Critical Care Medicine [facility] Pager: [phone]  12/04/2011, 9:23 AM

## 2011-12-04 NOTE — Progress Notes (Signed)
Subjective: Patient breathing better.  Cough improved.   Objective: Filed Vitals:   12/04/11 1024 12/04/11 1032 12/04/11 1359 12/04/11 1436  BP:   140/93   Pulse:  109 100   Temp:   98.2 F (36.8 C)   TempSrc:   Oral   Resp:   18   Height:      Weight:      SpO2: 100% 97% 96% 95%   Weight change:    General: Alert, awake, oriented x3, in no acute distress.  HEENT: No bruits, no goiter.  Heart: Regular rate and rhythm, without murmurs, rubs, gallops.  Lungs: no wheezes, bilateral air movement.  Abdomen: Soft, nontender, nondistended, positive bowel sounds.  Neuro: Grossly intact, nonfocal. Extremities; no edema.   Lab Results:  [facility] 12/04/11 0545 12/03/11 0510  NA 140 145  K 4.2 4.0  CL 103 108  CO2 27 27  GLUCOSE 140* 136*  BUN 22 14  CREATININE 0.86 0.81  CALCIUM 9.1 9.5  MG -- --  PHOS -- --    Basename 12/02/11 0550  AST 18  ALT 13  ALKPHOS 72  BILITOT 0.1*  PROT 6.6  ALBUMIN 2.8*    Basename 12/04/11 0545 12/03/11 0510 12/02/11 0550  WBC 16.7* 16.1* --  NEUTROABS -- -- 3.4  HGB 10.1* 10.0* --  HCT 31.2* 30.1* --  MCV 84.8 85.0 --  PLT 352 379 --    Basename 12/02/11 0101  CKTOTAL 65  CKMB 2.0  CKMBINDEX --  TROPONINI <0.30    Basename 12/02/11 0101  TSH 0.080*  T4TOTAL --  T3FREE 3.1  THYROIDAB --   Micro Results: Recent Results (from the past 240 hour(s))  MRSA PCR SCREENING     Status: Abnormal   Collection Time   12/02/11  3:03 AM      Component Value Range Status Comment   MRSA by PCR POSITIVE (*) NEGATIVE Final     Studies/Results: No results found.  Medications: I have reviewed the patient's current medications.  1-COPD with exacerbation:  -Clinical improved.  -Continue with Steroids change to prednisone, Nebs to every 6 hours. Continue with Empiric Levaquin change to PO.  -Incentive spirometry and flutter valve.   2-Acute on chronic Resp Failure:  -2/2 to above.  -O2 as needed.  3-Palpitation/ Tachycardia:    -EKG on admit-Sinus Tach.  -Mild sinus tachycardia on Tele-likely 2/2 to Beta agonist use  -CTA chest on admit-neg for PE.  -Check TSH: low TSH but normal Free T3 and T4.  -c/w cardizem  -ECHO: EF 70 %   4-DM:  -moderate control  -C/w SSI  -resume Victoza on discharge   5-GERD  -PPI   6-Dyslipidemia  -c/w Statins   7-Anemia  -chronic issue  -monitor H/H periodically  -c/w Iron supplementation  -Hb at 10.   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       LOS: 3 days   Saafir Abdullah M.D.  [facility] 12/04/2011, 5:29 PM

## 2011-12-04 NOTE — Plan of Care (Signed)
Problem: Phase II Progression Outcomes Goal: Discharge plan established Outcome: Completed/Met Date Met:  12/04/11 To return home

## 2011-12-04 NOTE — Clinical Social Work Psychosocial (Signed)
     Clinical Social Work Department BRIEF PSYCHOSOCIAL ASSESSMENT 12/04/2011  Patient:  [name], [name]     Account Number:  [id number]     Admit date:  12/01/2011  Clinical Social Worker:  Jacelyn Grip  Date/Time:  12/04/2011 04:00 PM  Referred by:  Care Management  Date Referred:  12/04/2011 Referred for  Other - See comment  Advanced Directives   Other Referral:   Other psychosocial needs- Advanced Directives and information on Indpendent Living   Interview type:  Patient Other interview type:    PSYCHOSOCIAL DATA Living Status:  FAMILY Admitted from facility:   Level of care:   Primary support name:  Aundrena Avery/[phone] Primary support relationship to patient:  FAMILY Degree of support available:   adequate    CURRENT CONCERNS Current Concerns  Post-Acute Placement   Other Concerns:    SOCIAL WORK ASSESSMENT / PLAN CSW received notification from [facility] that pt requesting to speak to CSW about updating Advanced Directives and information about Independent Living Facilities. CSW met with pt at bedside. Pt discussed that she currently lives with pt mother and sister. Pt discussed psychosocial stressors from medical conditions and living with family. Pt reports that she is able to maintain most activities of daily living, but it "takes her a little longer" than it may others. Pt discussed that she does not feel that she needs an assisted living facility, but is interested in exploring senior living apartments/independent living facilities. CSW was notified by [facility] that [facility] will be following pt at discharge and will be able to assist pt from home with these resources and CSW discussed [facility] services with pt and provided list of independent living/senior living apartments to pt. Pt discussed that due to chronic medical conditions she has an existing advanced directive, but is interested in Advanced Directive packet to review and update  Advanced Directive packet. CSW provided Advanced Directive packet. Pt discussed her hx of depression and states that she is followed by her PCP and attempts to be involved in community activities when able to medically. CSW encouraged pt to continue this and discussed resources including [facility] and [employer] and pt expressed that she was familiar with these resources. No further social work needs identified at this time. CSW signing off.   Assessment/plan status:  No Further Intervention Required Other assessment/ plan:   Information/referral to community resources:   Independent Living Facility list, Advanced Directives, and discussed community activities that pt can get involved in.    PATIENTS/FAMILYS RESPONSE TO PLAN OF CARE: Pt alert and oriented and pleasant. Pt appreciative of CSW support and assistance with resources. Pt expressed that it was helpful to discuss her current stressors and is eager to begin services with [facility] at discharge. Please re-consult social work if any further needs arise.

## 2011-12-05 DIAGNOSIS — R319 Hematuria, unspecified: Secondary | ICD-10-CM

## 2011-12-05 MED ORDER — FAMOTIDINE 20 MG PO TABS: 20.0000 mg | ORAL_TABLET | Freq: Two times a day (BID) | ORAL | Status: DC

## 2011-12-05 MED ORDER — PREDNISONE 20 MG PO TABS: ORAL_TABLET | ORAL | Status: DC

## 2011-12-05 MED ORDER — METOPROLOL TARTRATE 12.5 MG HALF TABLET: 12.5000 mg | ORAL_TABLET | Freq: Two times a day (BID) | ORAL | Status: DC

## 2011-12-05 MED ORDER — HYDROCOD POLST-CHLORPHEN POLST 10-8 MG/5ML PO LQCR: 5.0000 mL | Freq: Two times a day (BID) | ORAL | Status: DC

## 2011-12-05 MED ORDER — LEVOFLOXACIN 500 MG PO TABS: 500.0000 mg | ORAL_TABLET | Freq: Every day | ORAL | Status: AC

## 2011-12-05 NOTE — Progress Notes (Addendum)
Physician Discharge Summary  [name] CASTRONOVA [name]:[id number] DOB: 09-25-1950 DOA: 12/01/2011  This is Discharge summary.  PCP: [name], MD  Admit date: 12/01/2011 Discharge date: 12/05/2011  Discharge Diagnoses:   COPD exacerbation  Acute on chronic Resp Failure  DIABETES, TYPE 2  HYPERLIPIDEMIA  DEPRESSIVE DISORDER NOT ELSEWHERE CLASSIFIED  MULTIFOCAL ATRIAL TACHYCARDIA  COPD  GERD  CROHN'S DISEASE  Anxiety disorder  Palpitations  Normal coronary arteries 2009, nl-hyperdynamic LVF 2D 2010  Low TSH level, 0.08 with T3/T4 WNL  Acute-on-chronic respiratory failure  Sinus tachycardia  Hematuria   Discharge Condition: Stable.   Disposition:  Follow-up Information    Follow up with Anner Crete, MD. (Agust 19 at 11 clock. )    Contact information:   [address] [place] [address] [place] [address] [phone]       Follow up with Shan Levans, MD in 1 week. (please call office, to see physician assitance next week. )    Contact information:   [address] [facility] [address] [place] [address] [place] [address] [phone]          Diet: Diabetic diet.   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 In addition she's been having is palpitations which has been coming off and on has been worsening. She was originally scheduled to have 2-D echo done today by [facility]. But due to the shortness of breath she decided him to the ER. In the ER despite giving multiple doses of nebulizers and IV steroids patient is to wheezing and short of breath. Patient has been admitted for COPD management. Patient states she had called her pulmonologist and they were prescribed Levaquin over the phone. She had taken one dose of Levaquin.  Patient denies any chest pain dizziness or any loss of consciousness. Denies any fever chills but did notice increase cough with productive sputum.    Hospital Course:   1-COPD with exacerbation:  Patient was admitted with dyspnea, cough. She was started on IV solumedrol, nebulizer treatment xopenex, Levaquin.  Subsequently she was transition to  Prednisone. Will discharge today on prednisone taper, Levaquin for 5 more days. She will follow next week with PA Parret.  -Incentive spirometry and flutter valve.  2-Acute on chronic Resp Failure:  Secondary to COPD exacerbation. Patient respiratory status improved.   3-Palpitation/ Tachycardia:  -EKG on admit-Sinus Tach. Mild sinus tachycardia on Tele-likely 2/2 to Beta agonist use. Hr decrease after improvement of her COPD symptoms. She was also started on metoprolol by her cardiologist. CTA chest on admit-neg for PE. low TSH but normal Free T3 and T4. ECHO: EF 70 %   4-DM:  -resume Victoza on discharge  5-GERD  -PPI  6-Dyslipidemia  -c/w Statins  7-Anemia  -chronic issue  -monitor H/H periodically  -c/w Iron supplementation  -Hb at 10.  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 Worsening amount of urine likely in setting of lovenox, prophylasix dose for DVT. She has had prior Gynecology work up. I will order UA and urine culture. I arrange a follow up with  Urology Dr [name] Singer for August 19. Cultuie result needs to be follow up by PCP.      Discharge Exam: Filed Vitals:   12/05/11 1014  BP: 119/63  Pulse: 105  Temp:   Resp:    Filed Vitals:   12/05/11 0154 12/05/11 0459 12/05/11 0741 12/05/11 1014  BP:  154/70  119/63  Pulse:  77  105  Temp:  98.3 F (36.8 C)    TempSrc:  Oral    Resp:  18    Height:      Weight:  62.9 kg (138 lb 10.7 oz)    SpO2:  100% 100% 98%    General: No distress.  Cardiovascular: S1,S2 RRR. Respiratory: CTA.   Discharge Instructions  Discharge Orders    Future Appointments: Provider: Department: Dept Phone: Center:   01/02/2012 11:15 AM Storm Frisk, MD [facility] [phone] None     Future Orders Please Complete By Expires   Diet - low sodium heart healthy      Increase activity slowly        Medication List  As of 12/05/2011 10:26 AM   TAKE these medications         albuterol 108 (90 BASE) MCG/ACT inhaler   Commonly known as: PROVENTIL HFA;VENTOLIN HFA   Inhale 2 puffs into the lungs every 6 (six) hours as needed. For shortness of breath      aspirin 81 MG chewable tablet   Chew 81 mg by mouth every Monday, Wednesday, and Friday.      budesonide 0.25 MG/2ML nebulizer solution   Commonly known as: PULMICORT   Take 0.25 mg by nebulization 2 (two) times daily.      chlorpheniramine-HYDROcodone 10-8 MG/5ML Lqcr   Commonly known as: TUSSIONEX   Take 5 mLs by mouth every 12 (twelve) hours.      citalopram 20 MG tablet   Commonly known as: CELEXA   Take 20 mg by mouth daily.      diltiazem 180 MG 24 hr capsule   Commonly known as: CARDIZEM CD   Take 180 mg by mouth 2 (two) times daily.      famotidine 20 MG tablet   Commonly known as: PEPCID   Take 1 tablet (20 mg total) by mouth 2 (two) times daily.      folic acid 1 MG tablet   Commonly known as: FOLVITE   Take 1 mg by mouth daily.      furosemide 20 MG tablet   Commonly known as: LASIX   Take 20 mg by mouth daily as needed. For fluid retention      guaiFENesin 600 MG 12 hr tablet   Commonly known as: MUCINEX   Take 600-1,200 mg by mouth 3 (three) times daily. 1 tablet in the am, 2 tablet at lunch, 1 tablet in the evening.      HYDROcodone-acetaminophen 5-500 MG per tablet   Commonly known as: VICODIN   Take 1 tablet by mouth every 6 (six) hours as needed. For pain      iron polysaccharides 150 MG capsule   Commonly  known as: NIFEREX   Take 150 mg by mouth 2 (two) times daily.      levofloxacin 500 MG tablet   Commonly known as: LEVAQUIN   Take 1 tablet (500 mg total) by mouth daily.      metoprolol tartrate 12.5 mg Tabs   Commonly known as: LOPRESSOR   Take 0.5 tablets (12.5 mg total) by mouth 2 (  two) times daily.      multivitamin with minerals Tabs   Take 1 tablet by mouth daily.      omeprazole 40 MG capsule   Commonly known as: PRILOSEC   Take 40 mg by mouth 2 (two) times daily.      predniSONE 20 MG tablet   Commonly known as: DELTASONE   Take 2 tablet for 4 days then 1 tablet for 3 days then stop.      rosuvastatin 10 MG tablet   Commonly known as: CRESTOR   Take 10 mg by mouth daily.      tiotropium 18 MCG inhalation capsule   Commonly known as: SPIRIVA   Place 18 mcg into inhaler and inhale daily.      VICTOZA 18 MG/3ML Soln   Generic drug: Liraglutide   1.8 units once daily      Vitamin D 1000 UNITS capsule   Take 1,000 Units by mouth daily.      zafirlukast 10 MG tablet   Commonly known as: ACCOLATE   Take 10 mg by mouth 2 (two) times daily.              The results of significant diagnostics from this hospitalization (including imaging, microbiology, ancillary and laboratory) are listed below for reference.    Significant Diagnostic Studies: Dg Chest 2 View  12/01/2011  *RADIOLOGY REPORT*  Clinical Data: Shortness of breath for 1 week.  COPD.  Type 2 diabetes.  CHEST - 2 VIEW  Comparison: 10/31/2011  Findings: Lateral view degraded by patient arm position.  Midline trachea.  Normal heart size and mediastinal contours. Blunting of the costophrenic angles on the frontal is felt to be due to pleural thickening. No pneumothorax.  Mild interstitial thickening.  More focal linear opacity in the right suprahilar region/right upper lobe is most consistent with scarring.  The questioned nodular density within the lateral right upper lobe on the prior plain film is not  readily apparent.  Prominent anterior 1st right rib versus right upper lobe scarring more medially. Similar to 10/09/2009. No lobar consolidation.  There is chronic volume loss at the left lung base.  IMPRESSION:  1.  COPD/chronic bronchitis without acute superimposed process. 2.  Foci of right upper lobe scarring.  The nodular density questioned on 10/31/2011 is not readily apparent.  Please see that report and consider further evaluation with chest CT to exclude nodule in this area.  Original Report Authenticated By: Consuello Bossier, M.D.   Ct Angio Chest W/cm &/or Wo Cm  12/01/2011  *RADIOLOGY REPORT*  Clinical Data: Chest pain, shortness of breath and tachycardia.  CT ANGIOGRAPHY CHEST  Technique:  Multidetector CT imaging of the chest using the standard protocol during bolus administration of intravenous contrast. Multiplanar reconstructed images including MIPs were obtained and reviewed to evaluate the vascular anatomy.  Contrast: OMNIPAQUE IOHEXOL 350 MG/ML SOLN  Comparison: Chest radiograph performed earlier today at 03:44 p.m., and CT of the chest performed 07/13/2008  Findings: There is no evidence of pulmonary embolus.  Mild focal opacity at the right lung base likely reflects sequelae of prior infection, given appearance on the prior CT of the chest. There is also mild scarring within the right upper lobe.  Mild emphysematous change is noted at the lung apices.  There is no evidence of pleural effusion or pneumothorax.  No masses are identified; no abnormal focal contrast enhancement is seen.  The mediastinum is unremarkable in appearance.  No mediastinal lymphadenopathy is seen.  No pericardial effusion is identified. The great vessels are unremarkable in appearance.  No axillary lymphadenopathy is seen.  The visualized portions of the thyroid gland are unremarkable in appearance.  The visualized portions of the liver and spleen are unremarkable.  No acute osseous abnormalities are seen.   Significant degenerative change is noted at the right humeral head, with associated fragmentation.  IMPRESSION:  1.  No evidence of pulmonary embolus. 2.  Mild focal opacity at the right lung base likely reflects sequelae of remote infection, given appearance on the prior CT of the chest.  Mild scarring within the right upper lobe. 3.  Mild emphysematous change at the lung apices. 4.  Significant degenerative change at the right humeral head, with associated fragmentation.  Original Report Authenticated By: Tonia Ghent, M.D.    Microbiology: Recent Results (from the past 240 hour(s))  MRSA PCR SCREENING     Status: Abnormal   Collection Time   12/02/11  3:03 AM      Component Value Range Status Comment   MRSA by PCR POSITIVE (*) NEGATIVE Final      Labs: Basic Metabolic Panel:  Lab 12/04/11 9147 12/03/11 0510 12/02/11 0550 12/02/11 0101 12/01/11 1530  NA 140 145 139 -- 143  K 4.2 4.0 -- -- --  CL 103 108 103 -- 104  CO2 27 27 22  -- 30  GLUCOSE 140* 136* 152* -- 109*  BUN 22 14 8  -- 6  CREATININE 0.86 0.81 0.84 0.86 0.89  CALCIUM 9.1 9.5 9.9 -- 9.7  MG -- -- -- -- --  PHOS -- -- -- -- --   Liver Function Tests:  Lab 12/02/11 0550  AST 18  ALT 13  ALKPHOS 72  BILITOT 0.1*  PROT 6.6  ALBUMIN 2.8*   CBC:  Lab 12/04/11 0545 12/03/11 0510 12/02/11 0550 12/02/11 0101 12/01/11 1530  WBC 16.7* 16.1* 4.1 3.5* 5.3  NEUTROABS -- -- 3.4 -- --  HGB 10.1* 10.0* 9.9* 10.0* 10.6*  HCT 31.2* 30.1* 30.4* 31.7* 33.7*  MCV 84.8 85.0 86.1 86.4 86.9  PLT 352 379 368 PLATELET CLUMPS NOTED ON SMEAR, COUNT APPEARS ADEQUATE 379   Cardiac Enzymes:  Lab 12/02/11 0101  CKTOTAL 65  CKMB 2.0  CKMBINDEX --  TROPONINI <0.30   BNP: No components found with this basename: POCBNP:5 CBG:  Lab 12/05/11 0741 12/04/11 2110 12/04/11 1647 12/04/11 1305 12/04/11 0807  GLUCAP 90 203* 105* 132* 139*    Time coordinating discharge: 40 minutes.   Signed:  Cincere Deprey  [facility] 12/05/2011, 10:26 AM

## 2011-12-05 NOTE — Progress Notes (Signed)
Patient evaluated for long-term disease management services with [facility] Care Management Program. Patient will receive a post discharge transition of care call and monthly home visits for assessments and for education. Met with patient at bedside on 12-04-11 around 4pm to obtain consents and discuss [facility] services further. She will be assigned to a Nurse from [facility] Care Management to follow-up post discharge.  [name], MSN-Ed, RN,BSN  [facility] Liaison  [phone]

## 2011-12-05 NOTE — Progress Notes (Signed)
ANTIBIOTIC CONSULT NOTE - FOLLOW UP  Pharmacy Consult for Levaquin Indication: COPD exacerbation  Allergies  Allergen Reactions  . Esomeprazole Magnesium Other (See Comments)    NEXIUM - reaction > aggravated pt's Crohn's disease  . Other     Beans, Dander/Dust, Peas, Mushrooms  . Peanut-Containing Drug Products Other (See Comments)    unknown  . Shellfish Allergy Other (See Comments)    unknown    Patient Measurements: Height: 5\' 1"  (154.9 cm) Weight: 138 lb 10.7 oz (62.9 kg) IBW/kg (Calculated) : 47.8    Vital Signs: Temp: 98.3 F (36.8 C) (07/12 0459) Temp src: Oral (07/12 0459) BP: 154/70 mmHg (07/12 0459) Pulse Rate: 77  (07/12 0459) Intake/Output from previous day: 07/11 0701 - 07/12 0700 In: 470 [P.O.:420] Out: 750 [Urine:300; Stool:450] Intake/Output from this shift:    Labs:  [facility] 12/04/11 0545 12/03/11 0510  WBC 16.7* 16.1*  HGB 10.1* 10.0*  PLT 352 379  LABCREA -- --  CREATININE 0.86 0.81   Estimated Creatinine Clearance: 58.3 ml/min (by C-G formula based on Cr of 0.86). No results found for this basename: VANCOTROUGH:2,VANCOPEAK:2,VANCORANDOM:2,GENTTROUGH:2,GENTPEAK:2,GENTRANDOM:2,TOBRATROUGH:2,TOBRAPEAK:2,TOBRARND:2,AMIKACINPEAK:2,AMIKACINTROU:2,AMIKACIN:2, in the last 72 hours   Microbiology: Recent Results (from the past 720 hour(s))  MRSA PCR SCREENING     Status: Abnormal   Collection Time   12/02/11  3:03 AM      Component Value Range Status Comment   MRSA by PCR POSITIVE (*) NEGATIVE Final     Anti-infectives     Start     Dose/Rate Route Frequency Ordered Stop   12/04/11 1100   levofloxacin (LEVAQUIN) tablet 500 mg        500 mg Oral Daily 12/04/11 0923     12/03/11 0000   levofloxacin (LEVAQUIN) IVPB 500 mg  Status:  Discontinued        500 mg 100 mL/hr over 60 Minutes Intravenous Every 24 hours 12/02/11 0111 12/04/11 0923   12/02/11 0115   levofloxacin (LEVAQUIN) IVPB 500 mg        500 mg 100 mL/hr over 60 Minutes  Intravenous  Once 12/02/11 0111 12/02/11 0247   12/01/11 1545   moxifloxacin (AVELOX) IVPB 400 mg        400 mg 250 mL/hr over 60 Minutes Intravenous  Once 12/01/11 1530 12/01/11 1700          Assessment: 61 y.o. M on Levaquin D#4 for COPD exacerbation. Afebrile, WBC 16.7 (concurrently on steroids). SCr 0.86, CrCl~55-60 ml/min. Levaquin dose remains appropriate.   Goal of Therapy:  Eradication of Infection  Plan:  1. Continue Levaquin 500 mg IV every 24 hours 2. Please address LOT and consider entering a stop date after 5-7 days 3. Will continue to follow renal function, culture results, LOT, and antibiotic de-escalation plans   Georgina Pillion, PharmD, BCPS Clinical Pharmacist Pager: [phone] 12/05/2011 8:48 AM

## 2011-12-05 NOTE — Progress Notes (Signed)
Name: [name] MRN: [id number] DOB: 13-Oct-1950    LOS: 4  Referring Provider:  [facility] Reason for Referral:  COPD exac  PULMONARY / CRITICAL CARE MEDICINE  HPI:  This is a 61 year old African American female with golds stage D COPD and associated asthmatic bronchitic component.  The patient was admitted on 12/01/2011 for COPD exacerbation.   Brief patient description:  61 year old African American female with COPD exacerbation  Events Since Admission: CT scan of the chest on 12/01/2011 shows no pulmonary emboli  Current Status: Improved , ready for d/c     has blood in urine       Vital Signs: Temp:  [98.2 F (36.8 C)-98.5 F (36.9 C)] 98.3 F (36.8 C) (07/12 0459) Pulse Rate:  [77-109] 77  (07/12 0459) Resp:  [18-20] 18  (07/12 0459) BP: (136-154)/(70-93) 154/70 mmHg (07/12 0459) SpO2:  [95 %-100 %] 98 % (07/12 0741) Weight:  [62.9 kg (138 lb 10.7 oz)] 62.9 kg (138 lb 10.7 oz) (07/12 0459)  Physical Examination: General:  No acute distress Neuro:  Awake and alert note neuro deficit HEENT:  Moist mucous membranes Neck:  Supple no thyromegaly no jugular venous distention Cardiovascular:  Regular rate and rhythm normal S1-S2 no S3 no S4 no murmur rub heave or gallop Lungs:  Distant breath sounds expired wheezes, improved from 7/10 Abdomen:  Soft nontender bowel sounds active organomegaly Musculoskeletal:  Full range of motion without joint deformity Skin:  Clear   Principal Problem:  *COPD exacerbation Active Problems:  DIABETES, TYPE 2  HYPERLIPIDEMIA  DEPRESSIVE DISORDER NOT ELSEWHERE CLASSIFIED  MULTIFOCAL ATRIAL TACHYCARDIA  COPD  GERD  CROHN'S DISEASE  Anxiety disorder  Palpitations  Normal coronary arteries 2009, nl-hyperdynamic LVF 2D 2010  Low TSH level, 0.08 with T3/T4 WNL  Acute-on-chronic respiratory failure  Sinus tachycardia  Hematuria   ASSESSMENT AND PLAN  PULMONARY No results found for this basename:  PHART:5,PCO2:5,PCO2ART:5,PO2ART:5,HCO3:5,O2SAT:5 in the last 168 hours Ventilator Settings: Patient is on nasal oxygen   CXR:  None ETT:  None  A:  COPD exacerbation golds stage IV with associated asthmatic bronchitis and some degree of reversible airflow obstruction due to asthma P:   D/c home  F/u with NP Parrett in 5days in office next week  CARDIOVASCULAR  Lab 12/02/11 0101  TROPONINI <0.30  LATICACIDVEN --  PROBNP 89.7   Lines: Peripheral IV  A: Hyperlipidemia without evidence of active ischemia, or history of normal coronary artery disease P:  Per cardiology and primary team  RENAL  Lab 12/04/11 0545 12/03/11 0510 12/02/11 0550 12/02/11 0101 12/01/11 1530  NA 140 145 139 -- 143  K 4.2 4.0 -- -- --  CL 103 108 103 -- 104  CO2 27 27 22  -- 30  BUN 22 14 8  -- 6  CREATININE 0.86 0.81 0.84 0.86 0.89  CALCIUM 9.1 9.5 9.9 -- 9.7  MG -- -- -- -- --  PHOS -- -- -- -- --   Intake/Output      07/11 0701 - 07/12 0700 07/12 0701 - 07/13 0700   P.O. 420    I.V. 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 and associated Crohn's disease P:   Per primary team  H2 blocker  HEMATOLOGIC  Lab 12/04/11 0545 12/03/11 0510 12/02/11 0550 12/02/11 0101 12/01/11 1530  HGB 10.1* 10.0* 9.9* 10.0* 10.6*  HCT 31.2* 30.1* 30.4* 31.7* 33.7*  PLT 352 379 368 PLATELET CLUMPS NOTED ON SMEAR, COUNT APPEARS ADEQUATE 379  INR -- -- -- -- 1.08  APTT -- -- -- -- 31   A:  Mild anemia P:  Monitor  INFECTIOUS  Lab 12/04/11 0545 12/03/11 0510 12/02/11 0550 12/02/11 0101 12/01/11 1530  WBC 16.7* 16.1* 4.1 3.5* 5.3  PROCALCITON -- -- -- -- --    Cultures: None Antibiotics: Levaquin started 12/01/2011  A:  Acute tracheobronchitis without pneumonia P:   Per primary team Levaquin x 5 more doses upon d/c  ENDOCRINE  Lab 12/05/11 0741 12/04/11 2110 12/04/11 1647 12/04/11 1305 12/04/11 0807  GLUCAP 90 203* 105* 132* 139*   A:  Mild hyperglycemia   P:   per primary team  NEUROLOGIC  A: Lack of neurologic issue P:   Monitor  BEST PRACTICE / DISPOSITION Level of Care:  Floor status Primary Service:  [facility] Consultants:  PC CM, cardiology Code Status:  Full Diet:  Healthy heart diet DVT Px:  Low molecular weight heparin GI Px:  proton pump inhibitor>>>change to PO H2 blocker Skin Integrity:  Clear Social / Family:  Patient able to understand her own health she issues. She is no family the room when patient was rounded upon    Shan Levans, M.D. Pulmonary and Critical Care Medicine [facility] Pager: [phone]

## 2011-12-05 NOTE — Progress Notes (Signed)
[name] discharged Home per MD order.  Discharge instructions reviewed and discussed with the patient, all questions and concerns answered. Copy of instructions and scripts given to patient.   [name], [name] A  Home Medication Instructions [id number]   Printed on:12/05/11 1309  Medication Information                    diltiazem (CARDIZEM CD) 180 MG 24 hr capsule Take 180 mg by mouth 2 (two) times daily.            citalopram (CELEXA) 20 MG tablet Take 20 mg by mouth daily.             rosuvastatin (CRESTOR) 10 MG tablet Take 10 mg by mouth daily.             HYDROcodone-acetaminophen (VICODIN) 5-500 MG per tablet Take 1 tablet by mouth every 6 (six) hours as needed. For pain           guaiFENesin (MUCINEX) 600 MG 12 hr tablet Take 600-1,200 mg by mouth 3 (three) times daily. 1 tablet in the am, 2 tablet at lunch, 1 tablet in the evening.           omeprazole (PRILOSEC) 40 MG capsule Take 40 mg by mouth 2 (two) times daily.            Liraglutide (VICTOZA) 18 MG/3ML SOLN 1.8 units once daily           Cholecalciferol (VITAMIN D) 1000 UNITS capsule Take 1,000 Units by mouth daily.             Multiple Vitamin (MULTIVITAMIN WITH MINERALS) TABS Take 1 tablet by mouth daily.           furosemide (LASIX) 20 MG tablet Take 20 mg by mouth daily as needed. For fluid retention           iron polysaccharides (NIFEREX) 150 MG capsule Take 150 mg by mouth 2 (two) times daily.           folic acid (FOLVITE) 1 MG tablet Take 1 mg by mouth daily.           aspirin 81 MG chewable tablet Chew 81 mg by mouth every Monday, Wednesday, and Friday.           albuterol (PROVENTIL HFA;VENTOLIN HFA) 108 (90 BASE) MCG/ACT inhaler Inhale 2 puffs into the lungs every 6 (six) hours as needed. For shortness of breath           budesonide (PULMICORT) 0.25 MG/2ML nebulizer solution Take 0.25 mg by nebulization 2 (two) times daily.           tiotropium (SPIRIVA) 18 MCG  inhalation capsule Place 18 mcg into inhaler and inhale daily.           zafirlukast (ACCOLATE) 10 MG tablet Take 10 mg by mouth 2 (two) times daily.           famotidine (PEPCID) 20 MG tablet Take 1 tablet (20 mg total) by mouth 2 (two) times daily.           levofloxacin (LEVAQUIN) 500 MG tablet Take 1 tablet (500 mg total) by mouth daily.           metoprolol tartrate (LOPRESSOR) 12.5 mg TABS Take 0.5 tablets (12.5 mg total) by mouth 2 (two) times daily.           predniSONE (DELTASONE) 20 MG tablet Take 2 tablet for 4 days then 1 tablet  for 3 days then stop.           chlorpheniramine-HYDROcodone (TUSSIONEX) 10-8 MG/5ML LQCR Take 5 mLs by mouth every 12 (twelve) hours.             Patients skin is clean, dry and intact, no evidence of skin break down. IV site discontinued and catheter remains intact. Site without signs and symptoms of complications. Dressing and pressure applied.  Patient escorted to car by NT in a wheelchair & O2,  no distress noted upon discharge.  Laural Benes, Isabell Bonafede C 12/05/2011 1:09 PM 2

## 2011-12-06 ENCOUNTER — Other Ambulatory Visit: Payer: Self-pay | Admitting: Critical Care Medicine

## 2011-12-11 ENCOUNTER — Telehealth: Payer: Self-pay | Admitting: Critical Care Medicine

## 2011-12-11 NOTE — Telephone Encounter (Signed)
Jes, do you know if TP has this form? Please advise thanks!

## 2011-12-11 NOTE — Telephone Encounter (Signed)
Form is in TP's lookat.  Will ensure this is done and faxed tomorrow 12-12-11.

## 2011-12-12 NOTE — Telephone Encounter (Signed)
Signed by TP and faxed back to [employer].  As this came in TP's CMN folder from Alida, the form was returned to her.  Left detailed message on Christine Glasco's (?) supervise of billing voice mail informing her that this has been signed nad faxed back to the number on the form. Will sign off.

## 2011-12-22 ENCOUNTER — Other Ambulatory Visit: Payer: Self-pay | Admitting: Gastroenterology

## 2011-12-22 DIAGNOSIS — R1310 Dysphagia, unspecified: Secondary | ICD-10-CM

## 2011-12-24 ENCOUNTER — Ambulatory Visit (INDEPENDENT_AMBULATORY_CARE_PROVIDER_SITE_OTHER): Payer: Medicare Other | Admitting: Adult Health

## 2011-12-24 ENCOUNTER — Telehealth: Payer: Self-pay | Admitting: Adult Health

## 2011-12-24 ENCOUNTER — Encounter: Payer: Self-pay | Admitting: Adult Health

## 2011-12-24 VITALS — BP 124/78 | HR 101 | Temp 98.1°F | Ht 61.0 in | Wt 137.0 lb

## 2011-12-24 DIAGNOSIS — J441 Chronic obstructive pulmonary disease with (acute) exacerbation: Secondary | ICD-10-CM

## 2011-12-24 MED ORDER — ARFORMOTEROL TARTRATE 15 MCG/2ML IN NEBU: 15.0000 ug | INHALATION_SOLUTION | Freq: Two times a day (BID) | RESPIRATORY_TRACT | Status: DC

## 2011-12-24 NOTE — Progress Notes (Signed)
Subjective:    Patient ID: [name], female    DOB: 1950/10/18, 61 y.o.   MRN: [id number]  HPI  61 y.o.  woman with severe COPD Golds Stage IV   12/12 Since last OV,  Not wearing oxygen.   No new issues  08/08/11  Pt had a cough in Feb.  Rx Abx Zpak.  Now is better.  No real wheeze.  No edema in feet. >no changes   09/19/2011 [facility]  Patient returns for a post hospital followup. She was admitted April 13 of 09/08/2011 for an acute COPD exacerbation. She was treated with IV antibiotics, nebulized bronchodilators, and steroids. She responded very quickly and was discharged on a prednisone taper. Chest x-ray showed chronic changes without any acute process. Currently on Prednisone 40mg  daily  She was started on atrovent neb at discharge ? Taking or not.  Taking spiriva daily  Using albuterol neb Three times a day  . Did not get xopenex -not covered.  She denies any chest pain, hemoptysis, abdominal pain, nausea, vomiting, or leg swelling. >>no changes   11/17/11 [facility]  Returns for a post hospital follow up  Admitted 6/7-6/10/13 for atrial tachycardia. Complicated by anxiety and depression.  tx w/ anyxiolytics .  COPD flare tx w/ with steroids and nebs. Discharged on prednisone taper.  She is better w/ decreased cough and dyspnea. Still has occasional wheezing Feels weak and having low b/p 90/100 Today b/p 98/60 .  No chest pain or edema.   12/24/2011 Post Hospital  Patient returns for a post hospital followup.  She was admitted 7/8-7/12  acute COPD exacerbation.  She was treated with IV antibiotics, nebulized bronchodilators, and steroids. She responded very quickly and was discharged on a prednisone taper.  Chest x-ray showed chronic changes without any acute process.  CTA neg for PE  TSH low with nml T3/4  Currently on off prednisone  Since discharge she is feeling better  She has had recurrent admissions over last 4 months for AECOPD We called pharmacy  to verify med compliance > No refills on spiriva since 07/2011, previous refill 07/2010  Currently getting spiriva thru pt assistance program-delivered by mail to pt.  Regular refills on accolate  Currently in "doughnut hole" in May.  Lives with sister and mother. They do not smoke.  Ativan was added at discharge which is helping her anxiety  Now w/ [facility] support  She denies any chest pain, hemoptysis, abdominal pain, nausea, vomiting, or leg swelling.    Past Medical History  Diagnosis Date  . Other diseases of vocal cords   . Osteoporosis, unspecified   . Other and unspecified hyperlipidemia   . Esophageal reflux   . Chronic airway obstruction, not elsewhere classified   . Regional enteritis of unspecified site   . 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 Type II or unspecified type diabetes mellitus without mention of complication, not stated as uncontrolled   . Crohn's disease     Ileostomy  . Fibroid   . Pelvic adhesions   . Anxiety   . Depression   . Hypertension   . Shortness of breath   . Pneumonia   . Asthma      Family History  Problem Relation Age of Onset  . Osteoporosis Mother   . Hypertension Sister   . Stroke Sister   . Diabetes Sister      History   Social History  . Marital Status: Divorced    Spouse  Name: N/A    Number of Children: N/A  . Years of Education: N/A   Occupational History  . Not on file.   Social History Main Topics  . Smoking status: Former Smoker -- 1.0 packs/day for 32 years    Quit date: 05/27/2003  . Smokeless tobacco: Never Used  . Alcohol Use: 0.0 oz/week    0 Glasses of wine per week     occasional  . Drug Use: Not on file  . Sexually Active: Yes    Birth Control/ Protection: Surgical   Other Topics Concern  . Not on file   Social History Narrative  . No narrative on file     Allergies  Allergen Reactions  . Esomeprazole Magnesium Other (See Comments)    NEXIUM - reaction > aggravated pt's  Crohn's disease  . Other     Beans, Dander/Dust, Peas, Mushrooms  . Peanut-Containing Drug Products Other (See Comments)    unknown  . Shellfish Allergy Other (See Comments)    unknown     Outpatient Prescriptions Prior to Visit  Medication Sig Dispense Refill  . albuterol (PROVENTIL HFA;VENTOLIN HFA) 108 (90 BASE) MCG/ACT inhaler Inhale 2 puffs into the lungs every 6 (six) hours as needed. For shortness of breath      . aspirin 81 MG chewable tablet Chew 81 mg by mouth every Monday, Wednesday, and Friday.      . budesonide (PULMICORT) 0.25 MG/2ML nebulizer solution Take 0.25 mg by nebulization 2 (two) times daily.      . chlorpheniramine-HYDROcodone (TUSSIONEX) 10-8 MG/5ML LQCR Take 5 mLs by mouth every 12 (twelve) hours.  140 mL  0  . Cholecalciferol (VITAMIN D) 1000 UNITS capsule Take 1,000 Units by mouth daily.        . citalopram (CELEXA) 20 MG tablet Take 20 mg by mouth daily.        [name] diltiazem (CARDIZEM CD) 180 MG 24 hr capsule Take 180 mg by mouth 2 (two) times daily.       . famotidine (PEPCID) 20 MG tablet Take 1 tablet (20 mg total) by mouth 2 (two) times daily.  30 tablet  0  . folic acid (FOLVITE) 1 MG tablet Take 1 mg by mouth daily.      . furosemide (LASIX) 20 MG tablet Take 20 mg by mouth daily as needed. For fluid retention      . guaiFENesin (MUCINEX) 600 MG 12 hr tablet Take 600-1,200 mg by mouth 3 (three) times daily. 1 tablet in the am, 2 tablet at lunch, 1 tablet in the evening.      [name] HYDROcodone-acetaminophen (VICODIN) 5-500 MG per tablet Take 1 tablet by mouth every 6 (six) hours as needed. For pain      . iron polysaccharides (NIFEREX) 150 MG capsule Take 150 mg by mouth 2 (two) times daily.      . Liraglutide (VICTOZA) 18 MG/3ML SOLN 1.8 units once daily      . metoprolol tartrate (LOPRESSOR) 12.5 mg TABS Take 0.5 tablets (12.5 mg total) by mouth 2 (two) times daily.  60 tablet  0  . Multiple Vitamin (MULTIVITAMIN WITH MINERALS) TABS Take 1 tablet by mouth  daily.      [name] omeprazole (PRILOSEC) 40 MG capsule Take 40 mg by mouth 2 (two) times daily.       . predniSONE (DELTASONE) 20 MG tablet Take 2 tablet for 4 days then 1 tablet for 3 days then stop.  11 tablet  0  .  rosuvastatin (CRESTOR) 10 MG tablet Take 10 mg by mouth daily.        [name] tiotropium (SPIRIVA) 18 MCG inhalation capsule Place 18 mcg into inhaler and inhale daily.      . zafirlukast (ACCOLATE) 10 MG tablet Take 10 mg by mouth 2 (two) times daily.      . zafirlukast (ACCOLATE) 20 MG tablet take 1 tablet by mouth twice a day  60 tablet  0     Review of Systems  Constitutional:   No  weight loss, night sweats,  Fevers, chills, ++ fatigue, or  lassitude.  HEENT:   No headaches,  Difficulty swallowing,  Tooth/dental problems, or  Sore throat,                No sneezing, itching, ear ache, nasal congestion, post nasal drip,   CV:  No chest pain,  Orthopnea, PND, swelling in lower extremities, anasarca, dizziness, palpitations, syncope.   GI  No heartburn, indigestion, abdominal pain, nausea, vomiting, diarrhea, change in bowel habits, loss of appetite, bloody stools.   Resp:    No excess mucus, no productive cough,    No chest wall deformity  Skin: no rash or lesions.  GU: no dysuria, change in color of urine, no urgency or frequency.  No flank pain, no hematuria   MS:  No joint pain or swelling.  No decreased range of motion.    Psych:  ++ depression or anxiety.  No memory loss.        Objective:   Physical Exam  Filed Vitals:   12/24/11 0926  BP: 124/78  Pulse: 101  Temp: 98.1 F (36.7 C)  TempSrc: Oral  Height: 5\' 1"  (1.549 m)  Weight: 137 lb (62.143 kg)  SpO2: 97%    Gen: Pleasant, well-nourished, in no distress,  normal affect, chronically ill appearing in wheelchair   ENT: No lesions,  mouth clear,  oropharynx clear, no postnasal drip  Neck: No JVD, no TMG, no carotid bruits  Lungs: No use of accessory muscles, no dullness to percussion, coarse BS    Cardiovascular: RRR, heart sounds normal, no murmur or gallops, no peripheral edema  Abdomen: soft and NT, no HSM,  BS normal  Musculoskeletal: No deformities, no cyanosis or clubbing  Neuro: alert, non focal  Skin: Warm, no lesions or rashes           Assessment & Plan:   No problem-specific assessment & plan notes found for this encounter.   Updated Medication List Outpatient Encounter Prescriptions as of 12/24/2011  Medication Sig Dispense Refill  . albuterol (PROVENTIL HFA;VENTOLIN HFA) 108 (90 BASE) MCG/ACT inhaler Inhale 2 puffs into the lungs every 6 (six) hours as needed. For shortness of breath      . aspirin 81 MG chewable tablet Chew 81 mg by mouth every Monday, Wednesday, and Friday.      . budesonide (PULMICORT) 0.25 MG/2ML nebulizer solution Take 0.25 mg by nebulization 2 (two) times daily.      . chlorpheniramine-HYDROcodone (TUSSIONEX) 10-8 MG/5ML LQCR Take 5 mLs by mouth every 12 (twelve) hours.  140 mL  0  . Cholecalciferol (VITAMIN D) 1000 UNITS capsule Take 1,000 Units by mouth daily.        . citalopram (CELEXA) 20 MG tablet Take 20 mg by mouth daily.        [name] diltiazem (CARDIZEM CD) 180 MG 24 hr capsule Take 180 mg by mouth 2 (two) times daily.       [name]  famotidine (PEPCID) 20 MG tablet Take 1 tablet (20 mg total) by mouth 2 (two) times daily.  30 tablet  0  . folic acid (FOLVITE) 1 MG tablet Take 1 mg by mouth daily.      . furosemide (LASIX) 20 MG tablet Take 20 mg by mouth daily as needed. For fluid retention      . guaiFENesin (MUCINEX) 600 MG 12 hr tablet Take 600-1,200 mg by mouth 3 (three) times daily. 1 tablet in the am, 2 tablet at lunch, 1 tablet in the evening.      [name] HYDROcodone-acetaminophen (VICODIN) 5-500 MG per tablet Take 1 tablet by mouth every 6 (six) hours as needed. For pain      . iron polysaccharides (NIFEREX) 150 MG capsule Take 150 mg by mouth 2 (two) times daily.      . Liraglutide (VICTOZA) 18 MG/3ML SOLN 1.8 units once daily       . LORazepam (ATIVAN) 0.5 MG tablet Take 1 tablet by mouth as needed.      . metoprolol tartrate (LOPRESSOR) 12.5 mg TABS Take 0.5 tablets (12.5 mg total) by mouth 2 (two) times daily.  60 tablet  0  . Multiple Vitamin (MULTIVITAMIN WITH MINERALS) TABS Take 1 tablet by mouth daily.      [name] omeprazole (PRILOSEC) 40 MG capsule Take 40 mg by mouth 2 (two) times daily.       . predniSONE (DELTASONE) 20 MG tablet Take 2 tablet for 4 days then 1 tablet for 3 days then stop.  11 tablet  0  . rosuvastatin (CRESTOR) 10 MG tablet Take 10 mg by mouth daily.        [name] tiotropium (SPIRIVA) 18 MCG inhalation capsule Place 18 mcg into inhaler and inhale daily.      . zafirlukast (ACCOLATE) 10 MG tablet Take 10 mg by mouth 2 (two) times daily.      . zafirlukast (ACCOLATE) 20 MG tablet take 1 tablet by mouth twice a day  60 tablet  0

## 2011-12-24 NOTE — Telephone Encounter (Signed)
Pt seen today by TP and was to call back with clarification on her accolate and ativan.  Called spoke with patient who reported that the accolate she has been taking is the 20mg  tabs twice daily.  Her ativan is 0.5mg  with directions on the bottle to take every 6-8 hours as needed.  Pt's med list has been updated.  Nothing further needed per pt, will sign off.

## 2011-12-24 NOTE — Assessment & Plan Note (Addendum)
Exacerbation -recurrent w/ multiple hospital admissions recently  Reviewed all her medications  Ativan has been added which may help her chronic anxitey issues  She is off all steroids , may need to add back in low dose steroids to help prevent exacerbations  If she has readmission would consider checking  UDS - as this has been an issue in the past .   Plan Add Brovana to her current regimen.  Cont on Spiriva and budesonide and accolate  Follow up in 1 week w/ Dr. [name]  As planned  Will  need close office follow up in hopes to prevent readmission and improved COPD control  Cont w/ [facility] support as well.

## 2011-12-24 NOTE — Patient Instructions (Addendum)
Add [employer] Twice daily  (can mix w/ Budesonide Neb)  Continue on current regimen.  follow up Dr. [name]  Next week as planned  Please contact office for sooner follow up if symptoms do not improve or worsen or seek emergency care

## 2011-12-24 NOTE — Addendum Note (Signed)
Addended by: Boone Master E on: 12/24/2011 10:23 AM   Modules accepted: Orders

## 2011-12-24 NOTE — Telephone Encounter (Signed)
Noted.  Will inform TP when she returns to the office tomorrow.  Will sign off.

## 2011-12-26 ENCOUNTER — Telehealth: Payer: Self-pay | Admitting: Adult Health

## 2011-12-26 NOTE — Telephone Encounter (Signed)
Noted  

## 2011-12-26 NOTE — Telephone Encounter (Signed)
Spoke with pt. She is calling to let PW know that she was seen by her PCP today for low grade temp and wheezing and was prescribed avelox. Just an FYI. Will forward to PW as FYI.

## 2011-12-26 NOTE — Telephone Encounter (Signed)
Pt wanted to add a few things to her previous message.  Pt stated her BP laying down to standing up was 108-98.  Also, low-grade temp of 99 & pt is dehydrated.  [name]

## 2011-12-30 ENCOUNTER — Ambulatory Visit
Admission: RE | Admit: 2011-12-30 | Discharge: 2011-12-30 | Disposition: A | Discharge status: Home / Self Care | Payer: Medicare Other | Source: Ambulatory Visit | Attending: Gastroenterology | Admitting: Gastroenterology

## 2011-12-30 ENCOUNTER — Ambulatory Visit (INDEPENDENT_AMBULATORY_CARE_PROVIDER_SITE_OTHER): Payer: Medicare Other | Admitting: Obstetrics and Gynecology

## 2011-12-30 DIAGNOSIS — R1310 Dysphagia, unspecified: Secondary | ICD-10-CM

## 2011-12-30 DIAGNOSIS — N9089 Other specified noninflammatory disorders of vulva and perineum: Secondary | ICD-10-CM

## 2011-12-30 MED ORDER — SULFAMETHOXAZOLE-TRIMETHOPRIM 800-160 MG PO TABS: 1.0000 | ORAL_TABLET | Freq: Two times a day (BID) | ORAL | Status: AC

## 2011-12-30 NOTE — Progress Notes (Signed)
Patient came to see me today with a 2 day history of right she thought was a boil on her right labia. In addition she's been having some sweating at night that she thinks is different than when she had hot flashes previously was on HRT.  Exam: Kennon Portela present. External genitalia shows swelling on her right labia which is well demarcated approximately 3 cm in size and appears fluctuant.  Assessment: Right labial abscess  Plan: The area was prepped with Betadine. The area of the swelling was anesthetized with 1% Xylocaine. A large incision was made in the area and taken deep enough to enter  the swelling. The entire swelling was explored with a hemostat. Very little drainage was obtained making me think that this was mostly a skin cellulitis. The bleeding was controlled with Monsel's solution. She will use warm soaks 4 times a day. She was started on Septra DS twice a day for a week. She'll return to see me in a week. I suggest that she discuss this sweating she's having with her PCP.

## 2012-01-02 ENCOUNTER — Encounter: Payer: Self-pay | Admitting: Critical Care Medicine

## 2012-01-02 ENCOUNTER — Ambulatory Visit (INDEPENDENT_AMBULATORY_CARE_PROVIDER_SITE_OTHER): Payer: Medicare Other | Admitting: Critical Care Medicine

## 2012-01-02 VITALS — BP 122/64 | HR 105 | Temp 98.3°F | Ht 61.0 in | Wt 133.0 lb

## 2012-01-02 DIAGNOSIS — J449 Chronic obstructive pulmonary disease, unspecified: Secondary | ICD-10-CM

## 2012-01-02 DIAGNOSIS — J4489 Other specified chronic obstructive pulmonary disease: Secondary | ICD-10-CM

## 2012-01-02 MED ORDER — PREDNISONE 10 MG PO TABS: ORAL_TABLET | ORAL | Status: DC

## 2012-01-02 NOTE — Progress Notes (Signed)
Subjective:    Patient ID: [name], female    DOB: 1950/06/29, 61 y.o.   MRN: [id number]  HPI  61 y.o.  woman with severe COPD Golds Stage IV    11/17/11 [facility]  Returns for a post hospital follow up  Admitted 6/7-6/10/13 for atrial tachycardia. Complicated by anxiety and depression.  tx w/ anyxiolytics .  COPD flare tx w/ with steroids and nebs. Discharged on prednisone taper.  She is better w/ decreased cough and dyspnea. Still has occasional wheezing Feels weak and having low b/p 90/100 Today b/p 98/60 .  No chest pain or edema.   [facility]  Patient returns for a post hospital followup.  She was admitted 7/8-7/12  acute COPD exacerbation.  She was treated with IV antibiotics, nebulized bronchodilators, and steroids. She responded very quickly and was discharged on a prednisone taper.  Chest x-ray showed chronic changes without any acute process.  CTA neg for PE  TSH low with nml T3/4  Currently on off prednisone  Since discharge she is feeling better  She has had recurrent admissions over last 4 months for AECOPD We called pharmacy to verify med compliance > No refills on spiriva since 07/2011, previous refill 07/2010  Currently getting spiriva thru pt assistance program-delivered by mail to pt.  Regular refills on accolate  Currently in "doughnut hole" in May.  Lives with sister and mother. They do not smoke.  Ativan was added at discharge which is helping her anxiety  Now w/ [facility] support  She denies any chest pain, hemoptysis, abdominal pain, nausea, vomiting, or leg swelling.  01/02/2012 Pt seen by NP at last OV post hosp. Brovana added to neb med.   [facility] RN came once to see the pt.  Spiriva is through pt assistance program.  Ativan helps anxiety. Christoper Allegra is providing the brovana. Dyspnea is still an issue but is better.  Pt did cough up brown mucus.  Now finishing Bactrim Rx by GYN.  Now off pred. Pt has narrowing in esophagus.  Past Medical  History  Diagnosis Date  . Other diseases of vocal cords   . Osteoporosis, unspecified   . Other and unspecified hyperlipidemia   . Esophageal reflux   . Chronic airway obstruction, not elsewhere classified   . Regional enteritis of unspecified site   . 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 Type II or unspecified type diabetes mellitus without mention of complication, not stated as uncontrolled   . Crohn's disease     Ileostomy  . Fibroid   . Pelvic adhesions   . Anxiety   . Depression   . Hypertension   . Shortness of breath   . Pneumonia   . Asthma      Family History  Problem Relation Age of Onset  . Osteoporosis Mother   . Hypertension Sister   . Stroke Sister   . Diabetes Sister      History   Social History  . Marital Status: Divorced    Spouse Name: N/A    Number of Children: N/A  . Years of Education: N/A   Occupational History  . Not on file.   Social History Main Topics  . Smoking status: Former Smoker -- 1.0 packs/day for 32 years    Quit date: 05/27/2003  . Smokeless tobacco: Never Used  . Alcohol Use: 0.0 oz/week    0 Glasses of wine per week     occasional  . Drug Use: Not on file  .  Sexually Active: Yes    Birth Control/ Protection: Surgical   Other Topics Concern  . Not on file   Social History Narrative  . No narrative on file     Allergies  Allergen Reactions  . Esomeprazole Magnesium Other (See Comments)    NEXIUM - reaction > aggravated pt's Crohn's disease  . Other     Beans, Dander/Dust, Peas, Mushrooms  . Peanut-Containing Drug Products Other (See Comments)    unknown  . Shellfish Allergy Other (See Comments)    unknown     Outpatient Prescriptions Prior to Visit  Medication Sig Dispense Refill  . albuterol (PROVENTIL HFA;VENTOLIN HFA) 108 (90 BASE) MCG/ACT inhaler Inhale 2 puffs into the lungs every 6 (six) hours as needed. For shortness of breath      . arformoterol (BROVANA) 15 MCG/2ML NEBU Take 2  mLs (15 mcg total) by nebulization 2 (two) times daily.  120 mL  5  . aspirin 81 MG chewable tablet Chew 81 mg by mouth every Monday, Wednesday, and Friday.      . budesonide (PULMICORT) 0.25 MG/2ML nebulizer solution Take 0.25 mg by nebulization 2 (two) times daily.      . chlorpheniramine-HYDROcodone (TUSSIONEX) 10-8 MG/5ML LQCR Take 5 mLs by mouth every 12 (twelve) hours.  140 mL  0  . Cholecalciferol (VITAMIN D) 1000 UNITS capsule Take 1,000 Units by mouth daily.        . citalopram (CELEXA) 20 MG tablet Take 20 mg by mouth daily.        [name] diltiazem (CARDIZEM CD) 180 MG 24 hr capsule Take 180 mg by mouth 2 (two) times daily.       . famotidine (PEPCID) 20 MG tablet Take 1 tablet (20 mg total) by mouth 2 (two) times daily.  30 tablet  0  . folic acid (FOLVITE) 1 MG tablet Take 1 mg by mouth daily.      . furosemide (LASIX) 20 MG tablet Take 20 mg by mouth daily as needed. For fluid retention      . guaiFENesin (MUCINEX) 600 MG 12 hr tablet Take by mouth. 1 tablet in the am, 2 tablet at lunch, 1 tablet in the evening.      [name] HYDROcodone-acetaminophen (VICODIN) 5-500 MG per tablet Take 1 tablet by mouth every 6 (six) hours as needed. For pain      . iron polysaccharides (NIFEREX) 150 MG capsule Take 150 mg by mouth 2 (two) times daily.      . Liraglutide (VICTOZA) 18 MG/3ML SOLN 1.8 units once daily      . LORazepam (ATIVAN) 0.5 MG tablet 1 tab by mouth every 6-8 hours as needed      . metoprolol tartrate (LOPRESSOR) 12.5 mg TABS Take 0.5 tablets (12.5 mg total) by mouth 2 (two) times daily.  60 tablet  0  . Multiple Vitamin (MULTIVITAMIN WITH MINERALS) TABS Take 1 tablet by mouth daily.      [name] omeprazole (PRILOSEC) 40 MG capsule Take 40 mg by mouth 2 (two) times daily.       . rosuvastatin (CRESTOR) 10 MG tablet Take 10 mg by mouth daily.        [name] sulfamethoxazole-trimethoprim (BACTRIM DS,SEPTRA DS) 800-160 MG per tablet Take 1 tablet by mouth 2 (two) times daily.  14 tablet  0  . tiotropium  (SPIRIVA) 18 MCG inhalation capsule Place 18 mcg into inhaler and inhale daily.      . zafirlukast (ACCOLATE) 20 MG tablet take 1 tablet  by mouth twice a day  60 tablet  0  . predniSONE (DELTASONE) 20 MG tablet Take 2 tablet for 4 days then 1 tablet for 3 days then stop.  11 tablet  0     Review of Systems  Constitutional:   No  weight loss, night sweats,  Fevers, chills, ++ fatigue, or  lassitude.  HEENT:   No headaches,  Difficulty swallowing,  Tooth/dental problems, or  Sore throat,                No sneezing, itching, ear ache, nasal congestion, post nasal drip,   CV:  No chest pain,  Orthopnea, PND, swelling in lower extremities, anasarca, dizziness, palpitations, syncope.   GI  No heartburn, indigestion, abdominal pain, nausea, vomiting, diarrhea, change in bowel habits, loss of appetite, bloody stools.   Resp:    No excess mucus, no productive cough,    No chest wall deformity  Skin: no rash or lesions.  GU: no dysuria, change in color of urine, no urgency or frequency.  No flank pain, no hematuria   MS:  No joint pain or swelling.  No decreased range of motion.    Psych:  ++ depression or anxiety.  No memory loss.        Objective:   Physical Exam  Filed Vitals:   01/02/12 1116  BP: 122/64  Pulse: 105  Temp: 98.3 F (36.8 C)  TempSrc: Oral  Height: 5\' 1"  (1.549 m)  Weight: 60.328 kg (133 lb)  SpO2: 99%    Gen: Pleasant, well-nourished, in no distress,  normal affect, chronically ill appearing in wheelchair   ENT: No lesions,  mouth clear,  oropharynx clear, no postnasal drip  Neck: No JVD, no TMG, no carotid bruits  Lungs: No use of accessory muscles, no dullness to percussion, coarse BS   Cardiovascular: RRR, heart sounds normal, no murmur or gallops, no peripheral edema  Abdomen: soft and NT, no HSM,  BS normal  Musculoskeletal: No deformities, no cyanosis or clubbing  Neuro: alert, non focal  Skin: Warm, no lesions or rashes            Assessment & Plan:   COPD Gold D COPD oxygen and steroid dependent Plan Care management per [facility]  prednisone 10mg  Take 4 for two days three for two days two for two days one for two days Finish Bactrim course  No other medication changes Return 6 weeks     Updated Medication List Outpatient Encounter Prescriptions as of 01/02/2012  Medication Sig Dispense Refill  . albuterol (PROVENTIL HFA;VENTOLIN HFA) 108 (90 BASE) MCG/ACT inhaler Inhale 2 puffs into the lungs every 6 (six) hours as needed. For shortness of breath      . arformoterol (BROVANA) 15 MCG/2ML NEBU Take 2 mLs (15 mcg total) by nebulization 2 (two) times daily.  120 mL  5  . aspirin 81 MG chewable tablet Chew 81 mg by mouth every Monday, Wednesday, and Friday.      . budesonide (PULMICORT) 0.25 MG/2ML nebulizer solution Take 0.25 mg by nebulization 2 (two) times daily.      . chlorpheniramine-HYDROcodone (TUSSIONEX) 10-8 MG/5ML LQCR Take 5 mLs by mouth every 12 (twelve) hours.  140 mL  0  . Cholecalciferol (VITAMIN D) 1000 UNITS capsule Take 1,000 Units by mouth daily.        . citalopram (CELEXA) 20 MG tablet Take 20 mg by mouth daily.        [name] diltiazem (CARDIZEM CD) 180  MG 24 hr capsule Take 180 mg by mouth 2 (two) times daily.       . famotidine (PEPCID) 20 MG tablet Take 1 tablet (20 mg total) by mouth 2 (two) times daily.  30 tablet  0  . folic acid (FOLVITE) 1 MG tablet Take 1 mg by mouth daily.      . furosemide (LASIX) 20 MG tablet Take 20 mg by mouth daily as needed. For fluid retention      . guaiFENesin (MUCINEX) 600 MG 12 hr tablet Take by mouth. 1 tablet in the am, 2 tablet at lunch, 1 tablet in the evening.      [name] HYDROcodone-acetaminophen (VICODIN) 5-500 MG per tablet Take 1 tablet by mouth every 6 (six) hours as needed. For pain      . iron polysaccharides (NIFEREX) 150 MG capsule Take 150 mg by mouth 2 (two) times daily.      . Liraglutide (VICTOZA) 18 MG/3ML SOLN 1.8 units once daily      .  LORazepam (ATIVAN) 0.5 MG tablet 1 tab by mouth every 6-8 hours as needed      . metoprolol tartrate (LOPRESSOR) 12.5 mg TABS Take 0.5 tablets (12.5 mg total) by mouth 2 (two) times daily.  60 tablet  0  . Multiple Vitamin (MULTIVITAMIN WITH MINERALS) TABS Take 1 tablet by mouth daily.      [name] omeprazole (PRILOSEC) 40 MG capsule Take 40 mg by mouth 2 (two) times daily.       . rosuvastatin (CRESTOR) 10 MG tablet Take 10 mg by mouth daily.        [name] sulfamethoxazole-trimethoprim (BACTRIM DS,SEPTRA DS) 800-160 MG per tablet Take 1 tablet by mouth 2 (two) times daily.  14 tablet  0  . tiotropium (SPIRIVA) 18 MCG inhalation capsule Place 18 mcg into inhaler and inhale daily.      . zafirlukast (ACCOLATE) 20 MG tablet take 1 tablet by mouth twice a day  60 tablet  0  . predniSONE (DELTASONE) 10 MG tablet Take 4 for two days three for two days two for two days one for two days  20 tablet  0  . DISCONTD: predniSONE (DELTASONE) 20 MG tablet Take 2 tablet for 4 days then 1 tablet for 3 days then stop.  11 tablet  0

## 2012-01-02 NOTE — Patient Instructions (Addendum)
Take another round of prednisone 10mg  Take 4 for two days three for two days two for two days one for two days Finish Bactrim  No other medication changes Return 6 weeks

## 2012-01-02 NOTE — Assessment & Plan Note (Signed)
Gold D COPD oxygen and steroid dependent Plan Care management per [facility]  prednisone 10mg  Take 4 for two days three for two days two for two days one for two days Finish Bactrim course  No other medication changes Return 6 weeks

## 2012-01-02 NOTE — Discharge Summary (Signed)
[name] AOZ:[id number] DOB: [birth year] DOA: 12/01/2011  This is Discharge summary.  PCP: [name], MD  Admit date: 12/01/2011  Discharge date: 12/05/2011  Discharge Diagnoses:  COPD exacerbation  Acute on chronic Resp Failure  DIABETES, TYPE 2  HYPERLIPIDEMIA  DEPRESSIVE DISORDER NOT ELSEWHERE CLASSIFIED  MULTIFOCAL ATRIAL TACHYCARDIA  COPD  GERD  CROHN'S DISEASE  Anxiety disorder  Palpitations  Normal coronary arteries 2009, nl-hyperdynamic LVF 2D 2010  Low TSH level, 0.08 with T3/T4 WNL  Acute-on-chronic respiratory failure  Sinus tachycardia  Hematuria  Discharge Condition: Stable.  Disposition:  Follow-up Information    Follow up with Anner Crete, MD. (Agust 19 at 11 clock. )    Contact information:    [address] [place]  [address]  [place] [address]  [phone]       Follow up with Shan Levans, MD in 1 week. (please call office, to see physician assitance next week. )    Contact information:    [address]  [address] [place] [address]  [place] [address]  [phone]         Diet: Diabetic diet.  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 In addition she's been having is palpitations which has been coming off and on has been worsening. She was originally scheduled to have 2-D echo done today by [facility]. But due to the shortness of breath she decided him to the ER. In the ER despite giving multiple doses of nebulizers and IV steroids patient is to wheezing and short of breath. Patient has been admitted for COPD management. Patient states she had called her pulmonologist and they were prescribed Levaquin over the phone. She had taken one dose of Levaquin. Patient denies any chest pain dizziness  or any loss of consciousness. Denies any fever chills but did notice increase cough with productive sputum.  Hospital Course:  1-COPD with exacerbation:  Patient was admitted with dyspnea, cough. She was started on IV solumedrol, nebulizer treatment xopenex, Levaquin.  Subsequently she was transition to Prednisone. Will discharge today on prednisone taper, Levaquin for 5 more days. She will follow next week with PA Parret.  -Incentive spirometry and flutter valve.  2-Acute on chronic Resp Failure:  Secondary to COPD exacerbation. Patient respiratory status improved.  3-Palpitation/ Tachycardia:  -EKG on admit-Sinus Tach. Mild sinus tachycardia on Tele-likely 2/2 to Beta agonist use. Hr decrease after improvement of her COPD symptoms. She was also started on metoprolol by her cardiologist. CTA chest on admit-neg for PE. low TSH but normal Free T3 and T4. ECHO: EF 70 %  4-DM:  -resume Victoza on discharge  5-GERD  -PPI  6-Dyslipidemia  -c/w Statins  7-Anemia  -chronic issue  -monitor H/H periodically  -c/w Iron supplementation  -Hb at 10.  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 Worsening amount of urine likely in setting of lovenox, prophylasix dose for DVT. She has had prior Gynecology work up. I will order UA and urine culture. I arrange a follow up with Urology Dr [name] Singer for August 19. Cultuie  result needs to be follow up by PCP.  Discharge Exam:  Filed Vitals:    12/05/11 1014   BP:  119/63   Pulse:  105   Temp:    Resp:     Filed Vitals:    12/05/11 0154  12/05/11 0459  12/05/11 0741  12/05/11 1014   BP:   154/70   119/63   Pulse:   77   105   Temp:   98.3 F (36.8 C)     TempSrc:   Oral     Resp:   18     Height:       Weight:   62.9 kg (138 lb 10.7 oz)     SpO2:  100%  100%  98%     General: No  distress.  Cardiovascular: S1,S2 RRR.  Respiratory: CTA.  Discharge Instructions  Discharge Orders    Future Appointments:  Provider:  Department:  Dept Phone:  Center:    01/02/2012 11:15 AM  Storm Frisk, MD  [facility]  [phone]  None      Future Orders  Please Complete By  Expires    Diet - low sodium heart healthy      Increase activity slowly        Medication List  As of 12/05/2011 10:26 AM    TAKE these medications          albuterol 108 (90 BASE) MCG/ACT inhaler      Commonly known as: PROVENTIL HFA;VENTOLIN HFA      Inhale 2 puffs into the lungs every 6 (six) hours as needed. For shortness of breath      aspirin 81 MG chewable tablet      Chew 81 mg by mouth every Monday, Wednesday, and Friday.      budesonide 0.25 MG/2ML nebulizer solution      Commonly known as: PULMICORT      Take 0.25 mg by nebulization 2 (two) times daily.      chlorpheniramine-HYDROcodone 10-8 MG/5ML Lqcr      Commonly known as: TUSSIONEX      Take 5 mLs by mouth every 12 (twelve) hours.      citalopram 20 MG tablet      Commonly known as: CELEXA      Take 20 mg by mouth daily.      diltiazem 180 MG 24 hr capsule      Commonly known as: CARDIZEM CD      Take 180 mg by mouth 2 (two) times daily.      famotidine 20 MG tablet      Commonly known as: PEPCID      Take 1 tablet (20 mg total) by mouth 2 (two) times daily.      folic acid 1 MG tablet      Commonly known as: FOLVITE      Take 1 mg by mouth daily.      furosemide 20 MG tablet      Commonly known as: LASIX      Take 20 mg by mouth daily as needed. For fluid retention      guaiFENesin 600 MG 12 hr tablet      Commonly known as: MUCINEX      Take 600-1,200 mg by mouth 3 (three) times daily. 1 tablet in the am, 2 tablet at lunch, 1 tablet in the evening.      HYDROcodone-acetaminophen 5-500 MG per tablet      Commonly known as:  VICODIN      Take 1 tablet by mouth every 6 (six) hours as needed. For pain      iron  polysaccharides 150 MG capsule      Commonly known as: NIFEREX      Take 150 mg by mouth 2 (two) times daily.      levofloxacin 500 MG tablet      Commonly known as: LEVAQUIN      Take 1 tablet (500 mg total) by mouth daily.      metoprolol tartrate 12.5 mg Tabs      Commonly known as: LOPRESSOR      Take 0.5 tablets (12.5 mg total) by mouth 2 (two) times daily.      multivitamin with minerals Tabs      Take 1 tablet by mouth daily.      omeprazole 40 MG capsule      Commonly known as: PRILOSEC      Take 40 mg by mouth 2 (two) times daily.      predniSONE 20 MG tablet      Commonly known as: DELTASONE      Take 2 tablet for 4 days then 1 tablet for 3 days then stop.      rosuvastatin 10 MG tablet      Commonly known as: CRESTOR      Take 10 mg by mouth daily.      tiotropium 18 MCG inhalation capsule      Commonly known as: SPIRIVA      Place 18 mcg into inhaler and inhale daily.      VICTOZA 18 MG/3ML Soln      Generic drug: Liraglutide      1.8 units once daily      Vitamin D 1000 UNITS capsule      Take 1,000 Units by mouth daily.      zafirlukast 10 MG tablet      Commonly known as: ACCOLATE      Take 10 mg by mouth 2 (two) times daily.         The results of significant diagnostics from this hospitalization (including imaging, microbiology, ancillary and laboratory) are listed below for reference.   Significant Diagnostic Studies:  Dg Chest 2 View  12/01/2011 *RADIOLOGY REPORT* Clinical Data: Shortness of breath for 1 week. COPD. Type 2 diabetes. CHEST - 2 VIEW Comparison: 10/31/2011 Findings: Lateral view degraded by patient arm position. Midline trachea. Normal heart size and mediastinal contours. Blunting of the costophrenic angles on the frontal is felt to be due to pleural thickening. No pneumothorax. Mild interstitial thickening. More focal linear opacity in the right suprahilar region/right upper lobe is most consistent with scarring. The questioned nodular density  within the lateral right upper lobe on the prior plain film is not readily apparent. Prominent anterior 1st right rib versus right upper lobe scarring more medially. Similar to 10/09/2009. No lobar consolidation. There is chronic volume loss at the left lung base. IMPRESSION: 1. COPD/chronic bronchitis without acute superimposed process. 2. Foci of right upper lobe scarring. The nodular density questioned on 10/31/2011 is not readily apparent. Please see that report and consider further evaluation with chest CT to exclude nodule in this area. Original Report Authenticated By: Consuello Bossier, M.D.  Ct Angio Chest W/cm &/or Wo Cm  12/01/2011 *RADIOLOGY REPORT* Clinical Data: Chest pain, shortness of breath and tachycardia. CT ANGIOGRAPHY CHEST Technique: Multidetector CT imaging of the chest using the standard protocol during bolus administration of intravenous  contrast. Multiplanar reconstructed images including MIPs were obtained and reviewed to evaluate the vascular anatomy. Contrast: OMNIPAQUE IOHEXOL 350 MG/ML SOLN Comparison: Chest radiograph performed earlier today at 03:44 p.m., and CT of the chest performed 07/13/2008 Findings: There is no evidence of pulmonary embolus. Mild focal opacity at the right lung base likely reflects sequelae of prior infection, given appearance on the prior CT of the chest. There is also mild scarring within the right upper lobe. Mild emphysematous change is noted at the lung apices. There is no evidence of pleural effusion or pneumothorax. No masses are identified; no abnormal focal contrast enhancement is seen. The mediastinum is unremarkable in appearance. No mediastinal lymphadenopathy is seen. No pericardial effusion is identified. The great vessels are unremarkable in appearance. No axillary lymphadenopathy is seen. The visualized portions of the thyroid gland are unremarkable in appearance. The visualized portions of the liver and spleen are unremarkable. No acute  osseous abnormalities are seen. Significant degenerative change is noted at the right humeral head, with associated fragmentation. IMPRESSION: 1. No evidence of pulmonary embolus. 2. Mild focal opacity at the right lung base likely reflects sequelae of remote infection, given appearance on the prior CT of the chest. Mild scarring within the right upper lobe. 3. Mild emphysematous change at the lung apices. 4. Significant degenerative change at the right humeral head, with associated fragmentation. Original Report Authenticated By: Tonia Ghent, M.D.  Microbiology:  Recent Results (from the past 240 hour(s))   MRSA PCR SCREENING Status: Abnormal    Collection Time    12/02/11 3:03 AM   Component  Value  Range  Status  Comment    MRSA by PCR  POSITIVE (*)  NEGATIVE  Final     Labs:  Basic Metabolic Panel:   Lab  12/04/11 0545  12/03/11 0510  12/02/11 0550  12/02/11 0101  12/01/11 1530   NA  140  145  139  --  143   K  4.2  4.0  --  --  --   CL  103  108  103  --  104   CO2  27  27  22   --  30   GLUCOSE  140*  136*  152*  --  109*   BUN  22  14  8   --  6   CREATININE  0.86  0.81  0.84  0.86  0.89   CALCIUM  9.1  9.5  9.9  --  9.7   MG  --  --  --  --  --   PHOS  --  --  --  --  --    Liver Function Tests:   Lab  12/02/11 0550   AST  18   ALT  13   ALKPHOS  72   BILITOT  0.1*   PROT  6.6   ALBUMIN  2.8*    CBC:   Lab  12/04/11 0545  12/03/11 0510  12/02/11 0550  12/02/11 0101  12/01/11 1530   WBC  16.7*  16.1*  4.1  3.5*  5.3   NEUTROABS  --  --  3.4  --  --   HGB  10.1*  10.0*  9.9*  10.0*  10.6*   HCT  31.2*  30.1*  30.4*  31.7*  33.7*   MCV  84.8  85.0  86.1  86.4  86.9   PLT  352  379  368  PLATELET CLUMPS NOTED ON SMEAR, COUNT APPEARS ADEQUATE  379  Cardiac Enzymes:   Lab  12/02/11 0101   CKTOTAL  65   CKMB  2.0   CKMBINDEX  --   TROPONINI  <0.30    BNP:  No components found with this basename: POCBNP:5  CBG:   Lab  12/05/11 0741  12/04/11 2110  12/04/11 1647   12/04/11 1305  12/04/11 0807   GLUCAP  90  203*  105*  132*  139*    Time coordinating discharge: 40 minutes.  Signed:  Tila Millirons  [facility]  12/05/2011, 10:26 AM

## 2012-01-06 ENCOUNTER — Ambulatory Visit (INDEPENDENT_AMBULATORY_CARE_PROVIDER_SITE_OTHER): Payer: Medicare Other | Admitting: Obstetrics and Gynecology

## 2012-01-06 DIAGNOSIS — N9089 Other specified noninflammatory disorders of vulva and perineum: Secondary | ICD-10-CM

## 2012-01-06 NOTE — Progress Notes (Signed)
Patient came back today for followup of a labial abscess that I drained one week ago. She is doing well and is continued her antibiotics and sitz baths and says it's much better.  Exam: [name] present. External: Skin lesion is still open but without infection. There is no more drainage. Most of the edema is gone.  Assessment: Labial abscess  Plan: Patient reassured. She will finish her antibiotics. She will continue warm soaks. She'll return in 3 weeks if not completely healed.

## 2012-01-06 NOTE — Patient Instructions (Addendum)
Return in 3 weeks if not completely healed.

## 2012-01-14 ENCOUNTER — Telehealth: Payer: Self-pay | Admitting: Critical Care Medicine

## 2012-01-14 NOTE — Telephone Encounter (Signed)
There is a scanned document for a government billing document with brovana on it in media tab scanned under date of 01/14/12.  Is this what they are looking for?

## 2012-01-14 NOTE — Telephone Encounter (Signed)
Spoke with [employer] to verify that the form that was scanned is the correct form. I have refaxed this to her at [place]. Nothing further needed.

## 2012-01-14 NOTE — Telephone Encounter (Signed)
Crystal, I checked with Jes and she has not seen this. Do you or PW have form? If not please advise and I will have them re fax, thanks!

## 2012-01-15 NOTE — Telephone Encounter (Signed)
Per Verlon Au, form needed to be dated and initialed by TP only.  Copy of the scanned form was handed to me.  Form was dated and initialed by TP.  Faxed back to [employer] at the number on the form.  Sent to be re-scanned into [employer].

## 2012-01-21 ENCOUNTER — Telehealth: Payer: Self-pay | Admitting: Critical Care Medicine

## 2012-01-21 MED ORDER — ZAFIRLUKAST 20 MG PO TABS: 20.0000 mg | ORAL_TABLET | Freq: Every day | ORAL | Status: DC

## 2012-01-21 NOTE — Telephone Encounter (Signed)
LMTCB

## 2012-01-21 NOTE — Telephone Encounter (Signed)
Dose is 20 mg daily

## 2012-01-21 NOTE — Telephone Encounter (Signed)
Called and spoke with [facility] and they stated that just a new rx for the accolate 20 mg daily needs to be sent in to them and her ID # [id number].  Will print out rx for the accolate and have PW sign this and fax it to   [phone]. Nothing further is needed.  Pt is aware that we will call and take care of this for her. Will sign off of this message.

## 2012-01-21 NOTE — Telephone Encounter (Signed)
Leigh spoke with patient bout dose through patient assistance program for Accolate;getting this corrected. Also, patient had wanted to know if PW would be okay with her volunteering locally at a museum for 2.5 hours; PW said NO. Pt is aware of this.

## 2012-01-21 NOTE — Telephone Encounter (Signed)
Spoke with patient-states that the patient assistance medication shows patient as taking Accolate 10mg  qd; our records show patient taking Accolate 20 mg BID and patient states she is taking Accolate 20 mg QD. PW please advise.   Pt would like a call on her cell [phone]

## 2012-01-22 NOTE — Telephone Encounter (Signed)
Accolate 20 mg rx signed by PW.  I have faxed this back to below number.

## 2012-01-23 ENCOUNTER — Telehealth: Payer: Self-pay | Admitting: Critical Care Medicine

## 2012-01-23 MED ORDER — HYDROCOD POLST-CHLORPHEN POLST 10-8 MG/5ML PO LQCR: 5.0000 mL | Freq: Two times a day (BID) | ORAL | Status: DC

## 2012-01-23 NOTE — Telephone Encounter (Signed)
Pt is calling and requesting a refill of the tussionex---last filled on 12/05/2011 by PW.  Please advise if ok to refill.  Thanks  Allergies  Allergen Reactions  . Esomeprazole Magnesium Other (See Comments)    NEXIUM - reaction > aggravated pt's Crohn's disease  . Other     Beans, Dander/Dust, Peas, Mushrooms  . Peanut-Containing Drug Products Other (See Comments)    unknown  . Shellfish Allergy Other (See Comments)    unknown

## 2012-01-23 NOTE — Telephone Encounter (Signed)
Ok to refill with no refills

## 2012-01-23 NOTE — Telephone Encounter (Signed)
Rx was called to pharm  LMOM for the pt to be made aware 

## 2012-01-28 ENCOUNTER — Telehealth: Payer: Self-pay | Admitting: Critical Care Medicine

## 2012-01-28 MED ORDER — PREDNISONE 10 MG PO TABS: ORAL_TABLET | ORAL | Status: DC

## 2012-01-28 NOTE — Telephone Encounter (Signed)
Spoke with pt.  She is aware of below recs per Dr. [name].  She verbalized understanding of instructions, is aware pred taper sent to [facility], and will call back if symptoms do not improve or worsen.

## 2012-01-28 NOTE — Telephone Encounter (Signed)
Keep 9/20 OV Call in prednisone 10mg  Take 4 for three days 3 for three days 2 for three days 1 for three days and stop  #30

## 2012-01-28 NOTE — Telephone Encounter (Signed)
lmomtcb -- which pharm does pt want pred rx sent to?

## 2012-01-28 NOTE — Telephone Encounter (Signed)
Pt c/o wheezing that started last night.  Some SOB on exertion.  Prod cough (clear), sinus drainage (clear).  Denies fever.  Pt using Brovana and Pulmicort BID HHN.  Pt not on Prednisone at this time.  Pt does not want to come to office for visit.  Please advise. Last ov:01-02-12  Next ov: 02-13-12 with Tammy Parrett Allergies  Allergen Reactions  . Esomeprazole Magnesium Other (See Comments)    NEXIUM - reaction > aggravated pt's Crohn's disease  . Other     Beans, Dander/Dust, Peas, Mushrooms  . Peanut-Containing Drug Products Other (See Comments)    unknown  . Shellfish Allergy Other (See Comments)    unknown

## 2012-01-30 ENCOUNTER — Telehealth: Payer: Self-pay | Admitting: Critical Care Medicine

## 2012-01-30 NOTE — Telephone Encounter (Signed)
I spoke with pt and she stated after she called [place], she called over to the health dept to get assistance with this medication (per pt they have done this in the past). She stated she wants to wait until she hears back from them before we change this medication. If she does not hear anything from them by Monday she will call us back. She wanted to know what OTC cough syrup we would recommend for now. I advised her delsym OTC. She stated she will check into this. Will await her call back regarding the tussionex.

## 2012-02-03 NOTE — Telephone Encounter (Signed)
I spoke with pt and she stated she tried the delsym and the cough has subsided. She did not want prescription cough syrup.

## 2012-02-10 ENCOUNTER — Encounter (HOSPITAL_COMMUNITY): Payer: Self-pay | Admitting: *Deleted

## 2012-02-10 ENCOUNTER — Ambulatory Visit (HOSPITAL_COMMUNITY)
Admission: RE | Admit: 2012-02-10 | Discharge: 2012-02-10 | Disposition: A | Discharge status: Home / Self Care | Payer: Medicare Other | Source: Ambulatory Visit | Attending: Gastroenterology | Admitting: Gastroenterology

## 2012-02-10 ENCOUNTER — Encounter (HOSPITAL_COMMUNITY): Payer: Self-pay | Admitting: Anesthesiology

## 2012-02-10 ENCOUNTER — Ambulatory Visit (HOSPITAL_COMMUNITY): Payer: Medicare Other | Admitting: Anesthesiology

## 2012-02-10 ENCOUNTER — Encounter (HOSPITAL_COMMUNITY)
Admission: RE | Disposition: A | Discharge status: Home / Self Care | Payer: Self-pay | Source: Ambulatory Visit | Attending: Gastroenterology

## 2012-02-10 DIAGNOSIS — J4489 Other specified chronic obstructive pulmonary disease: Secondary | ICD-10-CM | POA: Insufficient documentation

## 2012-02-10 DIAGNOSIS — K222 Esophageal obstruction: Secondary | ICD-10-CM | POA: Insufficient documentation

## 2012-02-10 DIAGNOSIS — Z9981 Dependence on supplemental oxygen: Secondary | ICD-10-CM | POA: Insufficient documentation

## 2012-02-10 DIAGNOSIS — K219 Gastro-esophageal reflux disease without esophagitis: Secondary | ICD-10-CM | POA: Insufficient documentation

## 2012-02-10 DIAGNOSIS — Z79899 Other long term (current) drug therapy: Secondary | ICD-10-CM | POA: Insufficient documentation

## 2012-02-10 DIAGNOSIS — E119 Type 2 diabetes mellitus without complications: Secondary | ICD-10-CM | POA: Insufficient documentation

## 2012-02-10 DIAGNOSIS — I10 Essential (primary) hypertension: Secondary | ICD-10-CM | POA: Insufficient documentation

## 2012-02-10 DIAGNOSIS — J449 Chronic obstructive pulmonary disease, unspecified: Secondary | ICD-10-CM | POA: Insufficient documentation

## 2012-02-10 HISTORY — PX: BALLOON DILATION: SHX5330

## 2012-02-10 HISTORY — DX: Emphysema, unspecified: J43.9

## 2012-02-10 LAB — GLUCOSE, CAPILLARY
Glucose-Capillary: 70 mg/dL (ref 70–99)
Glucose-Capillary: 78 mg/dL (ref 70–99)

## 2012-02-10 SURGERY — ESOPHAGOGASTRODUODENOSCOPY (EGD) WITH PROPOFOL
Anesthesia: Monitor Anesthesia Care

## 2012-02-10 MED ORDER — SODIUM CHLORIDE 0.9 % IV SOLN: INTRAVENOUS | Status: DC

## 2012-02-10 MED ORDER — LACTATED RINGERS IV SOLN
INTRAVENOUS | Status: DC
  Administered 2012-02-10: 1000 mL via INTRAVENOUS

## 2012-02-10 MED ORDER — FENTANYL CITRATE 0.05 MG/ML IJ SOLN: 25.0000 ug | INTRAMUSCULAR | Status: DC | PRN

## 2012-02-10 MED ORDER — BUTAMBEN-TETRACAINE-BENZOCAINE 2-2-14 % EX AERO
INHALATION_SPRAY | CUTANEOUS | Status: DC | PRN
  Administered 2012-02-10: 2 via TOPICAL

## 2012-02-10 MED ORDER — PROPOFOL 10 MG/ML IV EMUL
INTRAVENOUS | Status: DC | PRN
  Administered 2012-02-10: 150 mg via INTRAVENOUS
  Administered 2012-02-10: 20 mg via INTRAVENOUS

## 2012-02-10 MED ORDER — DEXTROSE 50 % IV SOLN
1.0000 | Freq: Once | INTRAVENOUS | Status: AC
  Administered 2012-02-10: 12.5 mL via INTRAVENOUS
  Filled 2012-02-10: qty 50

## 2012-02-10 SURGICAL SUPPLY — 14 items

## 2012-02-10 NOTE — Transfer of Care (Signed)
Immediate Anesthesia Transfer of Care Note  Patient: [name]  Procedure(s) Performed: Procedure(s) (LRB) with comments: ESOPHAGOGASTRODUODENOSCOPY (EGD) WITH PROPOFOL (N/A) BALLOON DILATION (N/A)  Patient Location: PACU  Anesthesia Type: MAC  Level of Consciousness: awake, oriented and patient cooperative  Airway & Oxygen Therapy: Patient Spontanous Breathing and Patient connected to nasal cannula oxygen  Post-op Assessment: Report given to PACU RN and Post -op Vital signs reviewed and stable  Post vital signs: Reviewed and stable  Complications: No apparent anesthesia complications

## 2012-02-10 NOTE — Anesthesia Postprocedure Evaluation (Signed)
  Anesthesia Post-op Note  Patient: [name]  Procedure(s) Performed: Procedure(s) (LRB): ESOPHAGOGASTRODUODENOSCOPY (EGD) WITH PROPOFOL (N/A) BALLOON DILATION (N/A)  Patient Location: PACU  Anesthesia Type: MAC  Level of Consciousness: awake and alert   Airway and Oxygen Therapy: Patient Spontanous Breathing  Post-op Pain: mild  Post-op Assessment: Post-op Vital signs reviewed, Patient's Cardiovascular Status Stable, Respiratory Function Stable, Patent Airway and No signs of Nausea or vomiting  Post-op Vital Signs: stable  Complications: No apparent anesthesia complications

## 2012-02-10 NOTE — Anesthesia Preprocedure Evaluation (Addendum)
Anesthesia Evaluation  Patient identified by MRN, date of birth, ID band Patient awake    Reviewed: Allergy & Precautions, H&P , NPO status , Patient's Chart, lab work & pertinent test results, reviewed documented beta blocker date and time   Airway Mallampati: II TM Distance: >3 FB Neck ROM: full    Dental No notable dental hx. (+) Caps,    Pulmonary neg pulmonary ROS, shortness of breath and with exertion, asthma , pneumonia -, resolved, COPD COPD inhaler and oxygen dependent,  Vocal cord dysfunction secondary to prior tracheostomy breath sounds clear to auscultation  Pulmonary exam normal       Cardiovascular Exercise Tolerance: Good hypertension, Pt. on medications and Pt. on home beta blockers negative cardio ROS  + dysrhythmias Rhythm:regular Rate:Normal  Multifocal atrial tachycardia. palpitations   Neuro/Psych Anxiety Depression negative neurological ROS  negative psych ROS   GI/Hepatic negative GI ROS, Neg liver ROS, GERD-  Medicated and Controlled,  Endo/Other  negative endocrine ROSdiabetes, Well Controlled, Type 2, Oral Hypoglycemic Agents  Renal/GU negative Renal ROS  negative genitourinary   Musculoskeletal   Abdominal   Peds  Hematology negative hematology ROS (+)   Anesthesia Other Findings   Reproductive/Obstetrics negative OB ROS                         Anesthesia Physical Anesthesia Plan  ASA: III  Anesthesia Plan: MAC   Post-op Pain Management:    Induction:   Airway Management Planned:   Additional Equipment:   Intra-op Plan:   Post-operative Plan:   Informed Consent: I have reviewed the patients History and Physical, chart, labs and discussed the procedure including the risks, benefits and alternatives for the proposed anesthesia with the patient or authorized representative who has indicated his/her understanding and acceptance.   Dental Advisory  Given  Plan Discussed with: Surgeon  Anesthesia Plan Comments:         Anesthesia Quick Evaluation

## 2012-02-10 NOTE — H&P (Signed)
Problem: Benign distal esophageal stricture  History: The patient is a 61 year old female with chronic gastroesophageal reflux complicated by a benign distal esophageal stricture which last required esophageal dilation in January 2008. The patient has undergone a total abdominal colectomy for Crohn's disease and has a functioning ileostomy. The patient has severe chronic obstructive pulmonary disease and is oxygen dependent.  To control gastroesophageal reflux, the patient is taking omeprazole 20 mg twice daily. She has intermittent esophageal dysphagia without odynophagia.  The patient is scheduled to undergo diagnostic esophagogastroduodenoscopy with balloon dilation of the distal esophageal stricture.  Medication allergies: None  Past medical and surgical history: Asthma. Severe chronic obstructive pulmonary disease. Gastroesophageal reflux disease complicated by a benign distal esophageal stricture. Chronic anxiety and question. 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 Perforated duodenal ulcer treated nonoperatively in 2007. Osteoporosis. Hyperlipidemia. Hypertension. Type 2 diabetes mellitus. Vaginal hysterectomy.  Habits: The patient does not smoke cigarettes or consume alcohol.  Exam: The patient is alert and lying comfortably on the endoscopy stretcher. Sclera are nonicteric. Lungs are clear to auscultation. The patient is not wheezing. Cardiac exam reveals a regular rhythm without audible murmurs. Abdomen is soft, flat, and nontender to palpation in all quadrants.  Plan: Proceed with diagnostic esophagogastroduodenoscopy and balloon dilation of the distal esophageal stricture.

## 2012-02-10 NOTE — Op Note (Signed)
Procedure: Diagnostic esophagogastroduodenoscopy with esophageal balloon stricture dilation.  Endoscopist: Danise Edge  Premedication: Propofol administered by anesthesia  Procedure: The patient was placed in the left lateral decubitus position. The Pentax gastroscope was passed through the posterior hypopharynx into the proximal esophagus without difficulty. Hypopharynx, larynx, and vocal cords appeared normal.  Esophagoscopy: The proximal, mid, and lower segments of the esophageal mucosa appeared normal. The squamocolumnar junction was noted at 40 cm from the incisor teeth. There was no endoscopic evidence for the presence of erosive esophagitis, Barrett's esophagus, or esophageal stricture formation. Dilation at the esophagogastric junction was performed with the 18 mm esophageal balloon dilator. Post dilation there was no mucosal disruption confirming the absence of a significant esophageal stricture.  Gastroscopy: Retroflex view of the gastric cardia and fundus was normal. The gastric body, antrum, and pylorus appeared normal.  Duodenoscopy: The duodenal bulb and descending duodenum appeared normal.  Assessment: Chronic gastroesophageal reflux associated with a normal esophagogastroduodenoscopy.

## 2012-02-11 ENCOUNTER — Encounter (HOSPITAL_COMMUNITY): Payer: Self-pay | Admitting: Gastroenterology

## 2012-02-13 ENCOUNTER — Ambulatory Visit (INDEPENDENT_AMBULATORY_CARE_PROVIDER_SITE_OTHER): Payer: Medicare Other | Admitting: Adult Health

## 2012-02-13 ENCOUNTER — Encounter: Payer: Self-pay | Admitting: Adult Health

## 2012-02-13 VITALS — BP 110/60 | HR 108 | Temp 97.6°F | Ht 61.0 in | Wt 137.2 lb

## 2012-02-13 DIAGNOSIS — J449 Chronic obstructive pulmonary disease, unspecified: Secondary | ICD-10-CM

## 2012-02-13 DIAGNOSIS — J4489 Other specified chronic obstructive pulmonary disease: Secondary | ICD-10-CM

## 2012-02-13 DIAGNOSIS — Z23 Encounter for immunization: Secondary | ICD-10-CM

## 2012-02-13 DIAGNOSIS — F419 Anxiety disorder, unspecified: Secondary | ICD-10-CM

## 2012-02-13 DIAGNOSIS — F411 Generalized anxiety disorder: Secondary | ICD-10-CM

## 2012-02-13 MED ORDER — LORAZEPAM 0.5 MG PO TABS: 0.5000 mg | ORAL_TABLET | Freq: Three times a day (TID) | ORAL | Status: DC | PRN

## 2012-02-13 NOTE — Assessment & Plan Note (Signed)
Refill ativan

## 2012-02-13 NOTE — Progress Notes (Signed)
Subjective:    Patient ID: [name], female    DOB: [birth year], 61 y.o.   MRN: [id number]  HPI  61 y.o.  woman with severe COPD Golds Stage IV    11/17/11 [facility]  Returns for a post hospital follow up  Admitted 6/7-6/10/13 for atrial tachycardia. Complicated by anxiety and depression.  tx w/ anyxiolytics .  COPD flare tx w/ with steroids and nebs. Discharged on prednisone taper.  She is better w/ decreased cough and dyspnea. Still has occasional wheezing Feels weak and having low b/p 90/100 Today b/p 98/60 .  No chest pain or edema.   [facility]  Patient returns for a post hospital followup.  She was admitted 7/8-7/12  acute COPD exacerbation.  She was treated with IV antibiotics, nebulized bronchodilators, and steroids. She responded very quickly and was discharged on a prednisone taper.  Chest x-ray showed chronic changes without any acute process.  CTA neg for PE  TSH low with nml T3/4  Currently on off prednisone  Since discharge she is feeling better  She has had recurrent admissions over last 4 months for AECOPD We called pharmacy to verify med compliance > No refills on spiriva since 07/2011, previous refill 07/2010  Currently getting spiriva thru pt assistance program-delivered by mail to pt.  Regular refills on accolate  Currently in "doughnut hole" in May.  Lives with sister and mother. They do not smoke.  Ativan was added at discharge which is helping her anxiety  Now w/ [facility] support  She denies any chest pain, hemoptysis, abdominal pain, nausea, vomiting, or leg swelling.  01/02/2012 Pt seen by NP at last OV post hosp. Brovana added to neb med.   [facility] RN came once to see the pt.  Spiriva is through pt assistance program.  Ativan helps anxiety. Christoper Allegra is providing the brovana. Dyspnea is still an issue but is better.  Pt did cough up brown mucus.  Now finishing Bactrim Rx by GYN.  Now off pred. Pt has narrowing in esophagus. >pred taper    02/13/2012  6 week follow up for COPD  6 week follow up COPD - reports breathing is doing well.  no new complaints Went for esophageal dilatation few weeks ago, swallowing much better.  Breathing is at baseline. No flare of cough or wheezing.  Had flare of COPD flare with wheezing 2 weeks ago, called in steroid pack with resolution of symptoms [facility] checks on her monthly w/ phone call now.  Needs flu shot.  Doing well on brovana and pulmicort nebs Twice daily   Wants refill of ativan .    Past Medical History  Diagnosis Date  . Other diseases of vocal cords   . Osteoporosis, unspecified   . Other and unspecified hyperlipidemia   . Esophageal reflux   . Chronic airway obstruction, not elsewhere classified   . Regional enteritis of unspecified site   . 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 Type II or unspecified type diabetes mellitus without mention of complication, not stated as uncontrolled   . Crohn's disease     Ileostomy  . Fibroid   . Pelvic adhesions   . Anxiety   . Depression   . Hypertension   . Shortness of breath   . Pneumonia   . Asthma   . Emphysema      Family History  Problem Relation Age of Onset  . Osteoporosis Mother   . Hypertension Sister   . Stroke Sister   .  Diabetes Sister      History   Social History  . Marital Status: Divorced    Spouse Name: N/A    Number of Children: N/A  . Years of Education: N/A   Occupational History  . Not on file.   Social History Main Topics  . Smoking status: Former Smoker -- 1.0 packs/day for 32 years    Quit date: 05/27/2003  . Smokeless tobacco: Never Used  . Alcohol Use: 0.0 oz/week    0 Glasses of wine per week     occasional  . Drug Use: No  . Sexually Active: Yes    Birth Control/ Protection: Surgical   Other Topics Concern  . Not on file   Social History Narrative  . No narrative on file     Allergies  Allergen Reactions  . Esomeprazole Magnesium Other (See Comments)     NEXIUM - reaction > aggravated pt's Crohn's disease  . Other     Beans, Dander/Dust, Peas, Mushrooms  . Peanut-Containing Drug Products Other (See Comments)    unknown  . Shellfish Allergy Other (See Comments)    unknown     Outpatient Prescriptions Prior to Visit  Medication Sig Dispense Refill  . albuterol (PROVENTIL HFA;VENTOLIN HFA) 108 (90 BASE) MCG/ACT inhaler Inhale 2 puffs into the lungs every 6 (six) hours as needed. For shortness of breath      . arformoterol (BROVANA) 15 MCG/2ML NEBU Take 2 mLs (15 mcg total) by nebulization 2 (two) times daily.  120 mL  5  . aspirin 81 MG chewable tablet Chew 81 mg by mouth every Monday, Wednesday, and Friday.      . budesonide (PULMICORT) 0.25 MG/2ML nebulizer solution Take 0.25 mg by nebulization 2 (two) times daily.      . chlorpheniramine-HYDROcodone (TUSSIONEX) 10-8 MG/5ML LQCR Take 5 mLs by mouth every 12 (twelve) hours.  140 mL  0  . Cholecalciferol (VITAMIN D) 1000 UNITS capsule Take 1,000 Units by mouth daily.        . citalopram (CELEXA) 20 MG tablet Take 20 mg by mouth daily.       [name] diltiazem (CARDIZEM CD) 180 MG 24 hr capsule Take 180 mg by mouth 2 (two) times daily.       . famotidine (PEPCID) 20 MG tablet Take 1 tablet (20 mg total) by mouth 2 (two) times daily.  30 tablet  0  . folic acid (FOLVITE) 1 MG tablet Take 1 mg by mouth daily.      . furosemide (LASIX) 20 MG tablet Take 20 mg by mouth daily as needed. For fluid retention      . guaiFENesin (MUCINEX) 600 MG 12 hr tablet Take by mouth. 1 tablet in the am, 2 tablet at lunch, 1 tablet in the evening.      [name] HYDROcodone-acetaminophen (VICODIN) 5-500 MG per tablet Take 1 tablet by mouth every 6 (six) hours as needed. For pain      . iron polysaccharides (NIFEREX) 150 MG capsule Take 150 mg by mouth 2 (two) times daily.      . Liraglutide (VICTOZA) 18 MG/3ML SOLN 1.8 units once daily      . LORazepam (ATIVAN) 0.5 MG tablet 1 tab by mouth every 6-8 hours as needed      .  metoprolol tartrate (LOPRESSOR) 12.5 mg TABS Take 0.5 tablets (12.5 mg total) by mouth 2 (two) times daily.  60 tablet  0  . Multiple Vitamin (MULTIVITAMIN WITH MINERALS) TABS Take 1 tablet  by mouth daily.      [name] omeprazole (PRILOSEC) 40 MG capsule Take 40 mg by mouth 2 (two) times daily.       . predniSONE (DELTASONE) 10 MG tablet Take 4 for two days three for two days two for two days one for two days  20 tablet  0  . predniSONE (DELTASONE) 10 MG tablet Take 4 for three days 3 for three days 2 for three days 1 for three days and stop  30 tablet  0  . rosuvastatin (CRESTOR) 10 MG tablet Take 10 mg by mouth daily.        [name] tiotropium (SPIRIVA) 18 MCG inhalation capsule Place 18 mcg into inhaler and inhale daily.      . zafirlukast (ACCOLATE) 20 MG tablet Take 1 tablet (20 mg total) by mouth daily.  30 tablet  6     Review of Systems  Constitutional:   No  weight loss, night sweats,  Fevers, chills, ++ fatigue, or  lassitude.  HEENT:   No headaches,  Difficulty swallowing,  Tooth/dental problems, or  Sore throat,                No sneezing, itching, ear ache, nasal congestion, post nasal drip,   CV:  No chest pain,  Orthopnea, PND, swelling in lower extremities, anasarca, dizziness, palpitations, syncope.   GI  No heartburn, indigestion, abdominal pain, nausea, vomiting, diarrhea, change in bowel habits, loss of appetite, bloody stools.   Resp:    No excess mucus, no productive cough,    No chest wall deformity  Skin: no rash or lesions.  GU: no dysuria, change in color of urine, no urgency or frequency.  No flank pain, no hematuria   MS:  No joint pain or swelling.  No decreased range of motion.    Psych:  ++ depression or anxiety.  No memory loss.        Objective:   Physical Exam  Filed Vitals:   02/13/12 0918  BP: 110/60  Pulse: 108  Temp: 97.6 F (36.4 C)  TempSrc: Oral  Weight: 137 lb 3.2 oz (62.234 kg)  SpO2: 96%    Gen: Pleasant, well-nourished, in no  distress,  normal affect, chronically ill appearing  In great mood today   ENT: No lesions,  mouth clear,  oropharynx clear, no postnasal drip  Neck: No JVD, no TMG, no carotid bruits  Lungs: No use of accessory muscles, no dullness to percussion, coarse BS   Cardiovascular: RRR, heart sounds normal, no murmur or gallops, no peripheral edema  Abdomen: soft and NT, no HSM,  BS normal  Musculoskeletal: No deformities, no cyanosis or clubbing  Neuro: alert, non focal  Skin: Warm, no lesions or rashes           Assessment & Plan:   No problem-specific assessment & plan notes found for this encounter.   Updated Medication List Outpatient Encounter Prescriptions as of 02/13/2012  Medication Sig Dispense Refill  . albuterol (PROVENTIL HFA;VENTOLIN HFA) 108 (90 BASE) MCG/ACT inhaler Inhale 2 puffs into the lungs every 6 (six) hours as needed. For shortness of breath      . arformoterol (BROVANA) 15 MCG/2ML NEBU Take 2 mLs (15 mcg total) by nebulization 2 (two) times daily.  120 mL  5  . aspirin 81 MG chewable tablet Chew 81 mg by mouth every Monday, Wednesday, and Friday.      . budesonide (PULMICORT) 0.25 MG/2ML nebulizer solution Take 0.25 mg by  nebulization 2 (two) times daily.      . chlorpheniramine-HYDROcodone (TUSSIONEX) 10-8 MG/5ML LQCR Take 5 mLs by mouth every 12 (twelve) hours.  140 mL  0  . Cholecalciferol (VITAMIN D) 1000 UNITS capsule Take 1,000 Units by mouth daily.        . citalopram (CELEXA) 20 MG tablet Take 20 mg by mouth daily.       [name] diltiazem (CARDIZEM CD) 180 MG 24 hr capsule Take 180 mg by mouth 2 (two) times daily.       . famotidine (PEPCID) 20 MG tablet Take 1 tablet (20 mg total) by mouth 2 (two) times daily.  30 tablet  0  . folic acid (FOLVITE) 1 MG tablet Take 1 mg by mouth daily.      . furosemide (LASIX) 20 MG tablet Take 20 mg by mouth daily as needed. For fluid retention      . guaiFENesin (MUCINEX) 600 MG 12 hr tablet Take by mouth. 1 tablet  in the am, 2 tablet at lunch, 1 tablet in the evening.      [name] HYDROcodone-acetaminophen (VICODIN) 5-500 MG per tablet Take 1 tablet by mouth every 6 (six) hours as needed. For pain      . iron polysaccharides (NIFEREX) 150 MG capsule Take 150 mg by mouth 2 (two) times daily.      . Liraglutide (VICTOZA) 18 MG/3ML SOLN 1.8 units once daily      . LORazepam (ATIVAN) 0.5 MG tablet 1 tab by mouth every 6-8 hours as needed      . metoprolol tartrate (LOPRESSOR) 12.5 mg TABS Take 0.5 tablets (12.5 mg total) by mouth 2 (two) times daily.  60 tablet  0  . Multiple Vitamin (MULTIVITAMIN WITH MINERALS) TABS Take 1 tablet by mouth daily.      [name] omeprazole (PRILOSEC) 40 MG capsule Take 40 mg by mouth 2 (two) times daily.       . predniSONE (DELTASONE) 10 MG tablet Take 4 for two days three for two days two for two days one for two days  20 tablet  0  . predniSONE (DELTASONE) 10 MG tablet Take 4 for three days 3 for three days 2 for three days 1 for three days and stop  30 tablet  0  . rosuvastatin (CRESTOR) 10 MG tablet Take 10 mg by mouth daily.        [name] tiotropium (SPIRIVA) 18 MCG inhalation capsule Place 18 mcg into inhaler and inhale daily.      . zafirlukast (ACCOLATE) 20 MG tablet Take 1 tablet (20 mg total) by mouth daily.  30 tablet  6

## 2012-02-13 NOTE — Patient Instructions (Addendum)
Continue on current meds  Flu shot today  follow up Dr. [name]  In 6 weeks and As needed

## 2012-02-13 NOTE — Assessment & Plan Note (Signed)
Compensated on present regimen  Flu shot

## 2012-02-26 ENCOUNTER — Encounter: Payer: Self-pay | Admitting: Obstetrics and Gynecology

## 2012-02-26 ENCOUNTER — Ambulatory Visit (INDEPENDENT_AMBULATORY_CARE_PROVIDER_SITE_OTHER): Payer: Medicare Other | Admitting: Obstetrics and Gynecology

## 2012-02-26 VITALS — BP 120/78 | Ht 62.0 in | Wt 135.0 lb

## 2012-02-26 DIAGNOSIS — M810 Age-related osteoporosis without current pathological fracture: Secondary | ICD-10-CM

## 2012-02-26 DIAGNOSIS — N952 Postmenopausal atrophic vaginitis: Secondary | ICD-10-CM

## 2012-02-26 DIAGNOSIS — D069 Carcinoma in situ of cervix, unspecified: Secondary | ICD-10-CM

## 2012-02-26 DIAGNOSIS — R102 Pelvic and perineal pain: Secondary | ICD-10-CM

## 2012-02-26 DIAGNOSIS — Z780 Asymptomatic menopausal state: Secondary | ICD-10-CM

## 2012-02-26 DIAGNOSIS — N949 Unspecified condition associated with female genital organs and menstrual cycle: Secondary | ICD-10-CM

## 2012-02-26 DIAGNOSIS — N879 Dysplasia of cervix uteri, unspecified: Secondary | ICD-10-CM | POA: Insufficient documentation

## 2012-02-26 NOTE — Patient Instructions (Signed)
Schedule ultrasound and bone density.

## 2012-02-26 NOTE — Progress Notes (Signed)
Patient came to see me today for further follow up. She is having some hot flashes but they're tolerable. 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 She has a partner but they do  not have intercourse and just do other lovemaking because of his erectile  Problems.  She has noticed over the past year right lower quadrant pain 3-4 times a week. It is not associated with change in bowel habits. She is not having nausea or vomiting. She does not have dysuria frequency or urgency of urination. 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 We have treated her with Fosamax which she could not tolerate and started in 2007 IV Boniva followed by 4 years of IV Reclast. Part of her risk factors are constant use of steroids. Last year we elected to do a short-term followup bone density to see if she's continuing to loose bone and if she is switched forte. She is due for that this  December. We did not get her mammogram report this year and she will get it for [place]. Approximately 30 years ago she was treated for her CIN-3 with conization and has had normal Pap smears since then. Her last Pap smear was 2012. In 1993 I did an LAVH, left S&O for fibroids and DUB.   ROS: 12 systems review done. Pertinent positives above. Other positives include diabetes, COPD, esophageal stricture which she had dilated again this year, joint pain and she is scheduled to see a rheumatologist. Other positives include hyperlipidemia, depression, Crohn's disease, anxiety, and tachycardia  HEENT: Within normal limits.Kennon Portela present. Neck: No masses. Supraclavicular lymph nodes: Not enlarged. Breasts: Examined in both sitting and lying position. Symmetrical without skin changes or masses. Abdomen: Soft no masses guarding or rebound. No hernias. Status post ileostomy. Pelvic: External within normal limits. BUS within normal limits. Vaginal examination shows poor estrogen effect, no cystocele enterocele or rectocele. Cervix and uterus  absent. Adnexa within normal limits. Rectovaginal confirmatory. Extremities within normal limits.  Assessment: #1. Right lower quadrant pain #2. Osteoporosis #3. Menopausal symptoms #4. Atrophic vaginitis #5. CIN-3  Plan: Pelvic ultrasound. Bone density in December. Patient to get me mammogram report. Patient will let me know if she needs treatment for vaginal dryness. Pap not done.The new Pap smear guidelines were discussed with the patient.

## 2012-02-27 ENCOUNTER — Telehealth: Payer: Self-pay | Admitting: Critical Care Medicine

## 2012-02-27 MED ORDER — PREDNISONE 10 MG PO TABS: ORAL_TABLET | ORAL | Status: DC

## 2012-02-27 NOTE — Telephone Encounter (Signed)
Called, spoke with pt.  Informed her of below per TP.  She verbalized understanding of instructions and is aware of pending OV with Dr. [name] in Nov 2014.  She will call back sooner if needed or seek emergency care if symptoms worsen.  Pt states [facility] nurse visited her twice and called twice.  Afterwards, pt reports nurse "graduated" her bc she was doing so well.  States what the [facility] nurse and pharm had rec she do, she was already doing it.  She "happened to run into" the [facility] nurse yesterday and was advised to call them for any questions or new symptoms.  Will route back to TP -- are you ok with this?

## 2012-02-27 NOTE — Telephone Encounter (Signed)
That is fine for a steroid taper  Prednisone 10mg  #20, 4 tabs for 2 days, then 3 tabs for 2 days, 2 tabs for 2 days, then 1 tab for 2 days, then stop No refills.  Make sure [facility] nurse still checking on her.  If not better will ov  If worse ER  She was seen 2 weeks ago doing well.  Make sure she keeps follow up with Dr. [name]   Please contact office for sooner follow up if symptoms do not improve or worsen or seek emergency care

## 2012-02-27 NOTE — Telephone Encounter (Signed)
Patient is asking for a call back regarding issues below asap.

## 2012-02-27 NOTE — Telephone Encounter (Signed)
Per TP: that is fine.  Thank you.  Please have to call our office if anything further is needed or if her symptoms do not improve or worsen.  Called spoke with patient, advised that TP is aware that the [facility] nurse "graduated" her.  Pt aware to call if her symptoms do not improve or worsen or to seek emergency care.  Pt denied any questions and verbalized her understanding.  Will sign off.

## 2012-02-27 NOTE — Telephone Encounter (Signed)
I spoke with the pt and she states over the last week she has not felt well, c/o fatigue and needing to take naps during the day. She states this AM at 5:30 she was woken up by herself wheezing. She states she was advised to call if she developed any wheezing and she would be given a  pred taper. Pt had a 12 day pred taper called in on 01-28-12, last OV 02-13-12. Pt denies any increase in SOB, cough, congestion, fever at this time. Please advise.Carron Curie, CMA Allergies  Allergen Reactions  . Esomeprazole Magnesium Other (See Comments)    NEXIUM - reaction > aggravated pt's Crohn's disease  . Other     Beans, Dander/Dust, Peas, Mushrooms  . Peanut-Containing Drug Products Other (See Comments)    unknown  . Shellfish Allergy Other (See Comments)    unknown

## 2012-03-05 ENCOUNTER — Ambulatory Visit (INDEPENDENT_AMBULATORY_CARE_PROVIDER_SITE_OTHER): Payer: Medicare Other | Admitting: Obstetrics and Gynecology

## 2012-03-05 ENCOUNTER — Ambulatory Visit (INDEPENDENT_AMBULATORY_CARE_PROVIDER_SITE_OTHER): Payer: Medicare Other

## 2012-03-05 DIAGNOSIS — R1031 Right lower quadrant pain: Secondary | ICD-10-CM

## 2012-03-05 DIAGNOSIS — N949 Unspecified condition associated with female genital organs and menstrual cycle: Secondary | ICD-10-CM

## 2012-03-05 DIAGNOSIS — R102 Pelvic and perineal pain: Secondary | ICD-10-CM

## 2012-03-05 NOTE — Patient Instructions (Signed)
followup with Dr. Laural Benes.

## 2012-03-05 NOTE — Progress Notes (Signed)
Patient came back today for pelvic ultrasound due to chronic right lower quadrant pain. Please see notes from last office visit. On ultrasound her uterus is surgically absent. It is difficult to visualize the right ovary due to stool and the patient has trouble with the vaginal probe. There is clearly no mass there. We thought we did see the right ovary. There is a lot of stool in her rectum. There is no evidence of ascitic fluid.  Assessment: Right lower quadrant pain  Plan: Patient reassured that no mass was seen. She is having a sigmoidoscopy with Dr. Laural Benes on Monday and she'll discuss the pain with him.

## 2012-03-26 ENCOUNTER — Encounter (HOSPITAL_COMMUNITY): Payer: Self-pay | Admitting: Anesthesiology

## 2012-03-26 ENCOUNTER — Ambulatory Visit (HOSPITAL_COMMUNITY)
Admission: RE | Admit: 2012-03-26 | Discharge: 2012-03-26 | Disposition: A | Discharge status: Home / Self Care | Payer: Medicare Other | Source: Ambulatory Visit | Attending: Gastroenterology | Admitting: Gastroenterology

## 2012-03-26 ENCOUNTER — Encounter (HOSPITAL_COMMUNITY)
Admission: RE | Disposition: A | Discharge status: Home / Self Care | Payer: Self-pay | Source: Ambulatory Visit | Attending: Gastroenterology

## 2012-03-26 ENCOUNTER — Encounter (HOSPITAL_COMMUNITY): Payer: Self-pay | Admitting: *Deleted

## 2012-03-26 ENCOUNTER — Ambulatory Visit (HOSPITAL_COMMUNITY): Payer: Medicare Other | Admitting: Anesthesiology

## 2012-03-26 DIAGNOSIS — K219 Gastro-esophageal reflux disease without esophagitis: Secondary | ICD-10-CM | POA: Insufficient documentation

## 2012-03-26 DIAGNOSIS — M069 Rheumatoid arthritis, unspecified: Secondary | ICD-10-CM

## 2012-03-26 DIAGNOSIS — M810 Age-related osteoporosis without current pathological fracture: Secondary | ICD-10-CM | POA: Insufficient documentation

## 2012-03-26 DIAGNOSIS — K624 Stenosis of anus and rectum: Secondary | ICD-10-CM | POA: Insufficient documentation

## 2012-03-26 DIAGNOSIS — Z9981 Dependence on supplemental oxygen: Secondary | ICD-10-CM | POA: Insufficient documentation

## 2012-03-26 DIAGNOSIS — K625 Hemorrhage of anus and rectum: Secondary | ICD-10-CM | POA: Insufficient documentation

## 2012-03-26 DIAGNOSIS — K626 Ulcer of anus and rectum: Secondary | ICD-10-CM | POA: Insufficient documentation

## 2012-03-26 DIAGNOSIS — Z932 Ileostomy status: Secondary | ICD-10-CM | POA: Insufficient documentation

## 2012-03-26 DIAGNOSIS — Z9049 Acquired absence of other specified parts of digestive tract: Secondary | ICD-10-CM | POA: Insufficient documentation

## 2012-03-26 DIAGNOSIS — J4489 Other specified chronic obstructive pulmonary disease: Secondary | ICD-10-CM | POA: Insufficient documentation

## 2012-03-26 DIAGNOSIS — E785 Hyperlipidemia, unspecified: Secondary | ICD-10-CM | POA: Insufficient documentation

## 2012-03-26 DIAGNOSIS — J449 Chronic obstructive pulmonary disease, unspecified: Secondary | ICD-10-CM | POA: Insufficient documentation

## 2012-03-26 DIAGNOSIS — E119 Type 2 diabetes mellitus without complications: Secondary | ICD-10-CM | POA: Insufficient documentation

## 2012-03-26 DIAGNOSIS — I10 Essential (primary) hypertension: Secondary | ICD-10-CM | POA: Insufficient documentation

## 2012-03-26 HISTORY — PX: FLEXIBLE SIGMOIDOSCOPY: SHX5431

## 2012-03-26 HISTORY — DX: Rheumatoid arthritis, unspecified: M06.9

## 2012-03-26 LAB — GLUCOSE, CAPILLARY
Glucose-Capillary: 69 mg/dL — ABNORMAL LOW (ref 70–99)
Glucose-Capillary: 87 mg/dL (ref 70–99)

## 2012-03-26 SURGERY — SIGMOIDOSCOPY, FLEXIBLE
Anesthesia: Monitor Anesthesia Care

## 2012-03-26 MED ORDER — PROPOFOL 10 MG/ML IV EMUL
INTRAVENOUS | Status: DC | PRN
  Administered 2012-03-26: 140 ug/kg/min via INTRAVENOUS

## 2012-03-26 MED ORDER — SODIUM CHLORIDE 0.9 % IV SOLN: INTRAVENOUS | Status: DC

## 2012-03-26 MED ORDER — FENTANYL CITRATE 0.05 MG/ML IJ SOLN
INTRAMUSCULAR | Status: DC | PRN
  Administered 2012-03-26 (×2): 50 ug via INTRAVENOUS

## 2012-03-26 MED ORDER — LACTATED RINGERS IV SOLN
INTRAVENOUS | Status: DC | PRN
  Administered 2012-03-26: 12:00:00 via INTRAVENOUS

## 2012-03-26 MED ORDER — MIDAZOLAM HCL 5 MG/5ML IJ SOLN
INTRAMUSCULAR | Status: DC | PRN
  Administered 2012-03-26 (×2): 1 mg via INTRAVENOUS

## 2012-03-26 MED ORDER — KETAMINE HCL 10 MG/ML IJ SOLN
INTRAMUSCULAR | Status: DC | PRN
  Administered 2012-03-26: 10 mg via INTRAVENOUS

## 2012-03-26 NOTE — H&P (Signed)
  Problem: Rectal bleeding.  History: The patient is a 61 year old female born [birth year]. The patient has a history of Crohn's proctocolitis.In 2007, she underwent a total abdominal colectomy with ileostomy. The patient intermittently passes fresh blood per rectum.  The patient has severe chronic obstructive pulmonary disease and is oxygen dependent.  Medication allergies: None.  Past medical history: Severe chronic obstructive pulmonary disease with asthma. Gastroesophageal reflux disease complicated by a benign distal esophageal stricture. Chronic anxiety and depression. Crohn's proctocolitis. Total abdominal colectomy with ileostomy in 2007. Perforated duodenal ulcer treated nonoperatively in 2007. Osteoporosis. Hyperlipidemia. Hypertension. Type 2 diabetes mellitus. Vaginal hysterectomy.  Exam: The patient is alert and lying comfortably on the endoscopy stretcher. Lungs are clear to auscultation. Cardiac exam reveals a regular rhythm. Abdomen is soft, flat, and nontender to palpation. The patient has a functioning ileostomy.  Plan: Proceed with diagnostic proctoscopy to evaluate rectal bleeding as scheduled.

## 2012-03-26 NOTE — Op Note (Signed)
Procedure: Proctoscopy with rectal biopsies to evaluate rectal bleeding post total abdominal colectomy with ileostomy to treat Crohn's ileocolitis.  Endoscopist: Danise Edge.  Premedication: Propofol administered by anesthesia.  Procedure: The patient was placed in the left lateral decubitus position. Anal inspection was normal. The Pentax gastroscope was passed through the anal canal into the rectum. There is an anal stricture. The rectal mucosa is quite friable with spontaneous bleeding. There are no tumors, polyps, or ulcers present. Multiple biopsies were performed.  Assessment: #1. Anal stricture #2. Proctitis with biopsies pending #3. 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

## 2012-03-26 NOTE — Anesthesia Preprocedure Evaluation (Addendum)
Anesthesia Evaluation  Patient identified by MRN, date of birth, ID band Patient awake    Reviewed: Allergy & Precautions, H&P , NPO status , Patient's Chart, lab work & pertinent test results  Airway Mallampati: II TM Distance: >3 FB Neck ROM: Full   Comment: H/O prior tracheostomy. Dental No notable dental hx.    Pulmonary shortness of breath, asthma , pneumonia -, COPD COPD inhaler and oxygen dependent,  Oxygen at 1.5 to 2 L/min 24 hours a day. She reports she took all her regular pulmonary medicines today. She sees Dr. [name] every 2 months and is scheduled to see him next week. breath sounds clear to auscultation  Pulmonary exam normal       Cardiovascular hypertension, Pt. on home beta blockers and Pt. on medications + dysrhythmias Rhythm:Regular Rate:Normal  Cath 2009: normal coronary arteries., LVF: hyperdynamic 2010   Neuro/Psych PSYCHIATRIC DISORDERS Anxiety Depression negative neurological ROS     GI/Hepatic Neg liver ROS, GERD-  Medicated,  Endo/Other  diabetes, Type 2, Oral Hypoglycemic Agents  Renal/GU negative Renal ROS  negative genitourinary   Musculoskeletal negative musculoskeletal ROS (+)   Abdominal   Peds negative pediatric ROS (+)  Hematology negative hematology ROS (+)   Anesthesia Other Findings   Reproductive/Obstetrics negative OB ROS                          Anesthesia Physical Anesthesia Plan  ASA: III  Anesthesia Plan: MAC   Post-op Pain Management:    Induction: Intravenous  Airway Management Planned:   Additional Equipment:   Intra-op Plan:   Post-operative Plan:   Informed Consent: I have reviewed the patients History and Physical, chart, labs and discussed the procedure including the risks, benefits and alternatives for the proposed anesthesia with the patient or authorized representative who has indicated his/her understanding and acceptance.    Dental advisory given  Plan Discussed with: CRNA  Anesthesia Plan Comments:         Anesthesia Quick Evaluation

## 2012-03-26 NOTE — Preoperative (Signed)
Beta Blockers   Reason not to administer Beta Blockers:Not Applicable 

## 2012-03-26 NOTE — Transfer of Care (Signed)
Immediate Anesthesia Transfer of Care Note  Patient: [name]  Procedure(s) Performed: Procedure(s) (LRB) with comments: FLEXIBLE SIGMOIDOSCOPY (N/A)  Patient Location: PACU and Endoscopy Unit  Anesthesia Type:MAC  Level of Consciousness: awake, oriented, patient cooperative, lethargic and responds to stimulation  Airway & Oxygen Therapy: Patient Spontanous Breathing and Patient connected to face mask oxygen  Post-op Assessment: Report given to PACU RN, Post -op Vital signs reviewed and stable and Patient moving all extremities  Post vital signs: Reviewed and stable  Complications: No apparent anesthesia complications

## 2012-03-29 NOTE — Anesthesia Postprocedure Evaluation (Signed)
  Anesthesia Post-op Note  Patient: [name]  Procedure(s) Performed: Procedure(s) (LRB): FLEXIBLE SIGMOIDOSCOPY (N/A)  Patient Location: PACU  Anesthesia Type: MAC  Level of Consciousness: awake and alert   Airway and Oxygen Therapy: Patient Spontanous Breathing  Post-op Pain: mild  Post-op Assessment: Post-op Vital signs reviewed, Patient's Cardiovascular Status Stable, Respiratory Function Stable, Patent Airway and No signs of Nausea or vomiting  Post-op Vital Signs: stable  Complications: No apparent anesthesia complications

## 2012-03-30 ENCOUNTER — Ambulatory Visit (INDEPENDENT_AMBULATORY_CARE_PROVIDER_SITE_OTHER): Payer: Medicare Other | Admitting: Critical Care Medicine

## 2012-03-30 ENCOUNTER — Encounter (HOSPITAL_COMMUNITY): Payer: Self-pay | Admitting: Gastroenterology

## 2012-03-30 VITALS — BP 102/64 | HR 98 | Temp 98.0°F | Ht 61.0 in | Wt 138.0 lb

## 2012-03-30 DIAGNOSIS — J449 Chronic obstructive pulmonary disease, unspecified: Secondary | ICD-10-CM

## 2012-03-30 NOTE — Patient Instructions (Addendum)
No change in medications. Return in        2 months 

## 2012-03-30 NOTE — Assessment & Plan Note (Signed)
Gold stage D. COPD oxygen and steroid dependent but stable at this time Plan Maintain nebulized therapy as prescribed Return 2 months Vaccinations are up-to-date

## 2012-03-30 NOTE — Progress Notes (Signed)
Subjective:    Patient ID: [name], female    DOB: 01-03-1951, 61 y.o.   MRN: [id number]  HPI  61 y.o.  woman with severe COPD Golds Stage IV    7/31/13Post Hospital  Patient returns for a post hospital followup.  She was admitted 7/8-7/12  acute COPD exacerbation.  She was treated with IV antibiotics, nebulized bronchodilators, and steroids. She responded very quickly and was discharged on a prednisone taper.  Chest x-ray showed chronic changes without any acute process.  CTA neg for PE  TSH low with nml T3/4  Currently on off prednisone  Since discharge she is feeling better  She has had recurrent admissions over last 4 months for AECOPD We called pharmacy to verify med compliance > No refills on spiriva since 07/2011, previous refill 07/2010  Currently getting spiriva thru pt assistance program-delivered by mail to pt.  Regular refills on accolate  Currently in "doughnut hole" in May.  Lives with sister and mother. They do not smoke.  Ativan was added at discharge which is helping her anxiety  Now w/ [facility] support  She denies any chest pain, hemoptysis, abdominal pain, nausea, vomiting, or leg swelling.  01/02/2012 Pt seen by NP at last OV post hosp. Brovana added to neb med.   [facility] RN came once to see the pt.  Spiriva is through pt assistance program.  Ativan helps anxiety. Christoper Allegra is providing the brovana. Dyspnea is still an issue but is better.  Pt did cough up brown mucus.  Now finishing Bactrim Rx by GYN.  Now off pred. Pt has narrowing in esophagus. >pred taper   02/13/2012  6 week follow up for COPD  6 week follow up COPD - reports breathing is doing well.  no new complaints Went for esophageal dilatation few weeks ago, swallowing much better.  Breathing is at baseline. No flare of cough or wheezing.  Had flare of COPD flare with wheezing 2 weeks ago, called in steroid pack with resolution of symptoms [facility] checks on her monthly w/ phone call now.  Needs flu  shot.  Doing well on brovana and pulmicort nebs Twice daily   Wants refill of ativan .   03/30/2012 Pt had esophagus dilated in 9/13.  Pt had rectal bleeding>>> Dx per GI from Crohns dz. Copd f/u:  Cough is daily in AM clear pale yellow to brown mucus.  No flare of copd since last OV No real mucus.  On neb med BID. Pt denies any significant sore throat, nasal congestion or excess secretions, fever, chills, sweats, unintended weight loss, pleurtic or exertional chest pain, orthopnea PND, or leg swelling Pt denies any increase in rescue therapy over baseline, denies waking up needing it or having any early am or nocturnal exacerbations of coughing/wheezing/or dyspnea. Pt also denies any obvious fluctuation in symptoms with  weather or environmental change or other alleviating or aggravating factors  CAT Score 03/30/2012  Total CAT Score 21         Past Medical History  Diagnosis Date  . Other diseases of vocal cords   . Osteoporosis, unspecified   . Other and unspecified hyperlipidemia   . Esophageal reflux   . Chronic airway obstruction, not elsewhere classified   . Regional enteritis of unspecified site   . 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 Type II or unspecified type diabetes mellitus without mention of complication, not stated as uncontrolled   . Crohn's disease     Ileostomy  .  Fibroid   . Pelvic adhesions   . Anxiety   . Depression   . Hypertension   . Shortness of breath   . Pneumonia   . Asthma   . Emphysema   . Hematuria   . Cervical dysplasia      Family History  Problem Relation Age of Onset  . Osteoporosis Mother   . Kidney disease Mother   . Hypertension Sister   . Stroke Sister   . Diabetes Sister      History   Social History  . Marital Status: Divorced    Spouse Name: N/A    Number of Children: N/A  . Years of Education: N/A   Occupational History  . Not on file.   Social History Main Topics  . Smoking status: Former  Smoker -- 1.0 packs/day for 32 years    Quit date: 05/27/2003  . Smokeless tobacco: Never Used  . Alcohol Use: 0.0 oz/week    0 Glasses of wine per week     Comment: occasional  . Drug Use: No  . Sexually Active: No   Other Topics Concern  . Not on file   Social History Narrative  . No narrative on file     Allergies  Allergen Reactions  . Esomeprazole Magnesium Other (See Comments)    NEXIUM - reaction > aggravated pt's Crohn's disease  . Other     Beans, Dander/Dust, Peas, Mushrooms  . Peanut-Containing Drug Products Other (See Comments)    unknown  . Shellfish Allergy Other (See Comments)    unknown     Outpatient Prescriptions Prior to Visit  Medication Sig Dispense Refill  . albuterol (PROVENTIL HFA;VENTOLIN HFA) 108 (90 BASE) MCG/ACT inhaler Inhale 2 puffs into the lungs every 6 (six) hours as needed. For shortness of breath      . arformoterol (BROVANA) 15 MCG/2ML NEBU Take 2 mLs (15 mcg total) by nebulization 2 (two) times daily.  120 mL  5  . aspirin 81 MG chewable tablet Chew 81 mg by mouth every Monday, Wednesday, and Friday.      . budesonide (PULMICORT) 0.25 MG/2ML nebulizer solution Take 0.25 mg by nebulization 2 (two) times daily.      . Cholecalciferol (VITAMIN D) 1000 UNITS capsule Take 1,000 Units by mouth daily.        . citalopram (CELEXA) 20 MG tablet Take 20 mg by mouth daily.       [name] diltiazem (CARDIZEM CD) 180 MG 24 hr capsule Take 180 mg by mouth 2 (two) times daily.       . famotidine (PEPCID) 20 MG tablet Take 1 tablet (20 mg total) by mouth 2 (two) times daily.  30 tablet  0  . folic acid (FOLVITE) 1 MG tablet Take 1 mg by mouth daily.      . furosemide (LASIX) 20 MG tablet Take 40 mg by mouth daily. For fluid retention      . guaiFENesin (MUCINEX) 600 MG 12 hr tablet Take by mouth. 2 tablet in the am and 2 tablet in the afternoon      . HYDROcodone-acetaminophen (VICODIN) 5-500 MG per tablet Take 1 tablet by mouth every 6 (six) hours as needed.  For pain      . iron polysaccharides (NIFEREX) 150 MG capsule Take 150 mg by mouth 2 (two) times daily.      . Liraglutide (VICTOZA) 18 MG/3ML SOLN 1.8 units once daily      . LORazepam (ATIVAN) 0.5 MG tablet  Take 1 tablet (0.5 mg total) by mouth every 8 (eight) hours as needed for anxiety.  60 tablet  0  . metoprolol tartrate (LOPRESSOR) 12.5 mg TABS Take 0.5 tablets (12.5 mg total) by mouth 2 (two) times daily.  60 tablet  0  . Multiple Vitamin (MULTIVITAMIN WITH MINERALS) TABS Take 1 tablet by mouth daily.      [name] omeprazole (PRILOSEC) 40 MG capsule Take 40 mg by mouth 2 (two) times daily.       [name] tiotropium (SPIRIVA) 18 MCG inhalation capsule Place 18 mcg into inhaler and inhale daily.      . zafirlukast (ACCOLATE) 20 MG tablet Take 1 tablet (20 mg total) by mouth daily.  30 tablet  6  . chlorpheniramine-HYDROcodone (TUSSIONEX) 10-8 MG/5ML LQCR Take 5 mLs by mouth every 12 (twelve) hours.  140 mL  0     Review of Systems  Constitutional:   No  weight loss, night sweats,  Fevers, chills, ++ fatigue, or  lassitude.  HEENT:   No headaches,  Difficulty swallowing,  Tooth/dental problems, or  Sore throat,                No sneezing, itching, ear ache, nasal congestion, post nasal drip,   CV:  No chest pain,  Orthopnea, PND, swelling in lower extremities, anasarca, dizziness, palpitations, syncope.   GI  No heartburn, indigestion, abdominal pain, nausea, vomiting, diarrhea, change in bowel habits, loss of appetite, bloody stools.   Resp:    No excess mucus, no productive cough,    No chest wall deformity  Skin: no rash or lesions.  GU: no dysuria, change in color of urine, no urgency or frequency.  No flank pain, no hematuria   MS:  No joint pain or swelling.  No decreased range of motion.    Psych:  ++ depression or anxiety.  No memory loss.        Objective:   Physical Exam  Filed Vitals:   03/30/12 0922 03/30/12 0929  BP: 102/64   Pulse: 98   Temp: 98 F (36.7 C)     TempSrc: Oral   Height: 5\' 1"  (1.549 m)   Weight: 138 lb (62.596 kg)   SpO2: 86% 92%    Gen: Pleasant, well-nourished, in no distress,  normal affect, chronically ill appearing  In great mood today   ENT: No lesions,  mouth clear,  oropharynx clear, no postnasal drip  Neck: No JVD, no TMG, no carotid bruits  Lungs: No use of accessory muscles, no dullness to percussion, coarse BS   Cardiovascular: RRR, heart sounds normal, no murmur or gallops, no peripheral edema  Abdomen: soft and NT, no HSM,  BS normal  Musculoskeletal: No deformities, no cyanosis or clubbing  Neuro: alert, non focal  Skin: Warm, no lesions or rashes           Assessment & Plan:   COPD, Gold D Gold stage D. COPD oxygen and steroid dependent but stable at this time Plan Maintain nebulized therapy as prescribed Return 2 months Vaccinations are up-to-date    Updated Medication List Outpatient Encounter Prescriptions as of 03/30/2012  Medication Sig Dispense Refill  . albuterol (PROVENTIL HFA;VENTOLIN HFA) 108 (90 BASE) MCG/ACT inhaler Inhale 2 puffs into the lungs every 6 (six) hours as needed. For shortness of breath      . arformoterol (BROVANA) 15 MCG/2ML NEBU Take 2 mLs (15 mcg total) by nebulization 2 (two) times daily.  120 mL  5  .  aspirin 81 MG chewable tablet Chew 81 mg by mouth every Monday, Wednesday, and Friday.      . budesonide (PULMICORT) 0.25 MG/2ML nebulizer solution Take 0.25 mg by nebulization 2 (two) times daily.      . Cholecalciferol (VITAMIN D) 1000 UNITS capsule Take 1,000 Units by mouth daily.        . citalopram (CELEXA) 20 MG tablet Take 20 mg by mouth daily.       [name] diltiazem (CARDIZEM CD) 180 MG 24 hr capsule Take 180 mg by mouth 2 (two) times daily.       . famotidine (PEPCID) 20 MG tablet Take 1 tablet (20 mg total) by mouth 2 (two) times daily.  30 tablet  0  . folic acid (FOLVITE) 1 MG tablet Take 1 mg by mouth daily.      . furosemide (LASIX) 20 MG tablet Take  40 mg by mouth daily. For fluid retention      . guaiFENesin (MUCINEX) 600 MG 12 hr tablet Take by mouth. 2 tablet in the am and 2 tablet in the afternoon      . HYDROcodone-acetaminophen (VICODIN) 5-500 MG per tablet Take 1 tablet by mouth every 6 (six) hours as needed. For pain      . iron polysaccharides (NIFEREX) 150 MG capsule Take 150 mg by mouth 2 (two) times daily.      . Liraglutide (VICTOZA) 18 MG/3ML SOLN 1.8 units once daily      . LORazepam (ATIVAN) 0.5 MG tablet Take 1 tablet (0.5 mg total) by mouth every 8 (eight) hours as needed for anxiety.  60 tablet  0  . meloxicam (MOBIC) 15 MG tablet Take 1 tablet by mouth daily.      . metoprolol tartrate (LOPRESSOR) 12.5 mg TABS Take 0.5 tablets (12.5 mg total) by mouth 2 (two) times daily.  60 tablet  0  . Multiple Vitamin (MULTIVITAMIN WITH MINERALS) TABS Take 1 tablet by mouth daily.      [name] omeprazole (PRILOSEC) 40 MG capsule Take 40 mg by mouth 2 (two) times daily.       [name] tiotropium (SPIRIVA) 18 MCG inhalation capsule Place 18 mcg into inhaler and inhale daily.      . zafirlukast (ACCOLATE) 20 MG tablet Take 1 tablet (20 mg total) by mouth daily.  30 tablet  6  . [DISCONTINUED] chlorpheniramine-HYDROcodone (TUSSIONEX) 10-8 MG/5ML LQCR Take 5 mLs by mouth every 12 (twelve) hours.  140 mL  0

## 2012-04-09 ENCOUNTER — Telehealth: Payer: Self-pay | Admitting: Critical Care Medicine

## 2012-04-09 MED ORDER — AEROCHAMBER PLUS MISC: Status: DC

## 2012-04-09 NOTE — Telephone Encounter (Signed)
Called and spoke with pt and she is requesting a spacer be sent in for her rescue inhaler.  This has been sent to her pharmacy and pt is aware.

## 2012-04-13 ENCOUNTER — Other Ambulatory Visit: Payer: Self-pay | Admitting: Adult Health

## 2012-04-15 IMAGING — CR DG CHEST 2V
2 series · 2 of 2 positions shown · non-contrast
Comparison: 09/29/2009

CLINICAL DATA: Short of breath

CHEST - 2 VIEW

[w chest lat]
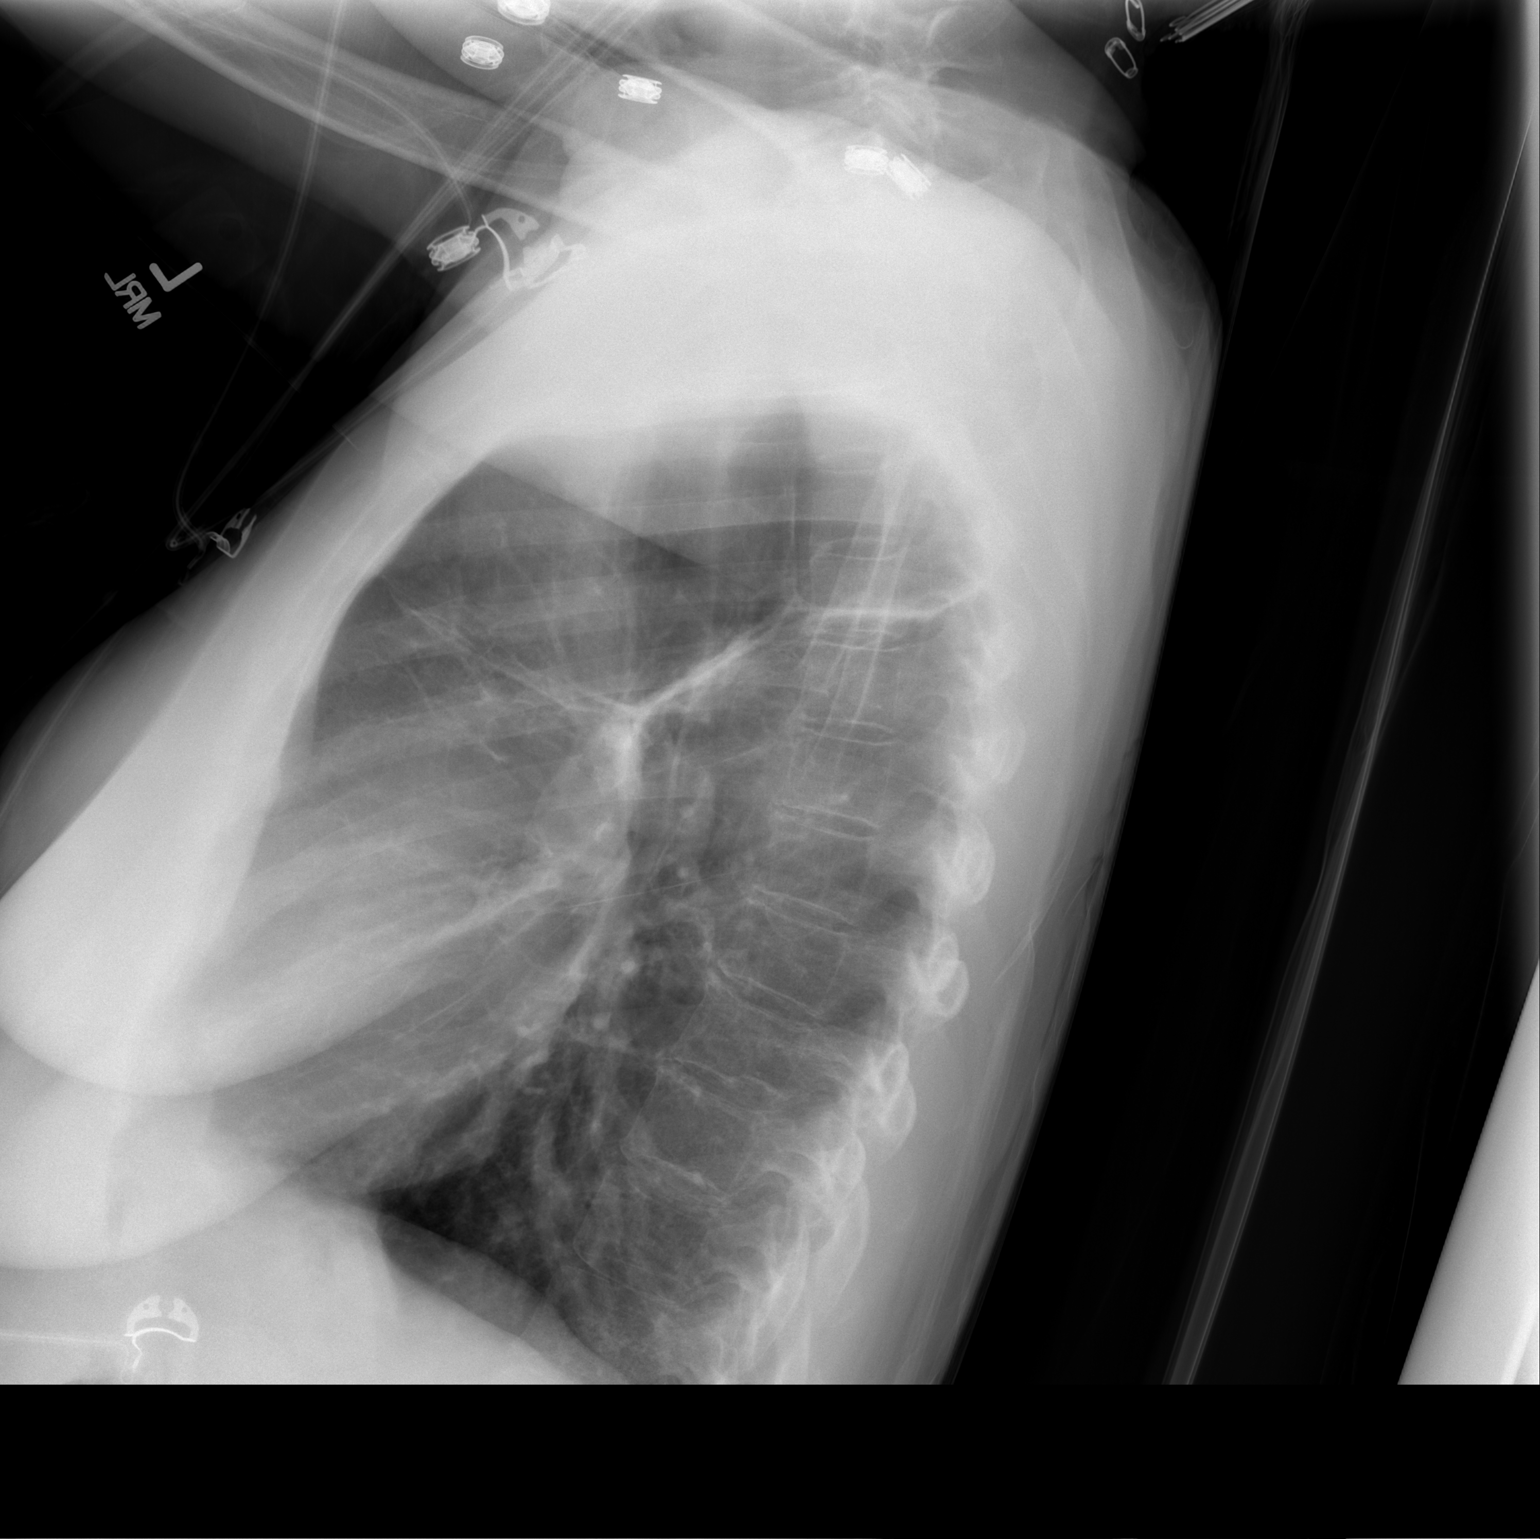

[view not recorded]
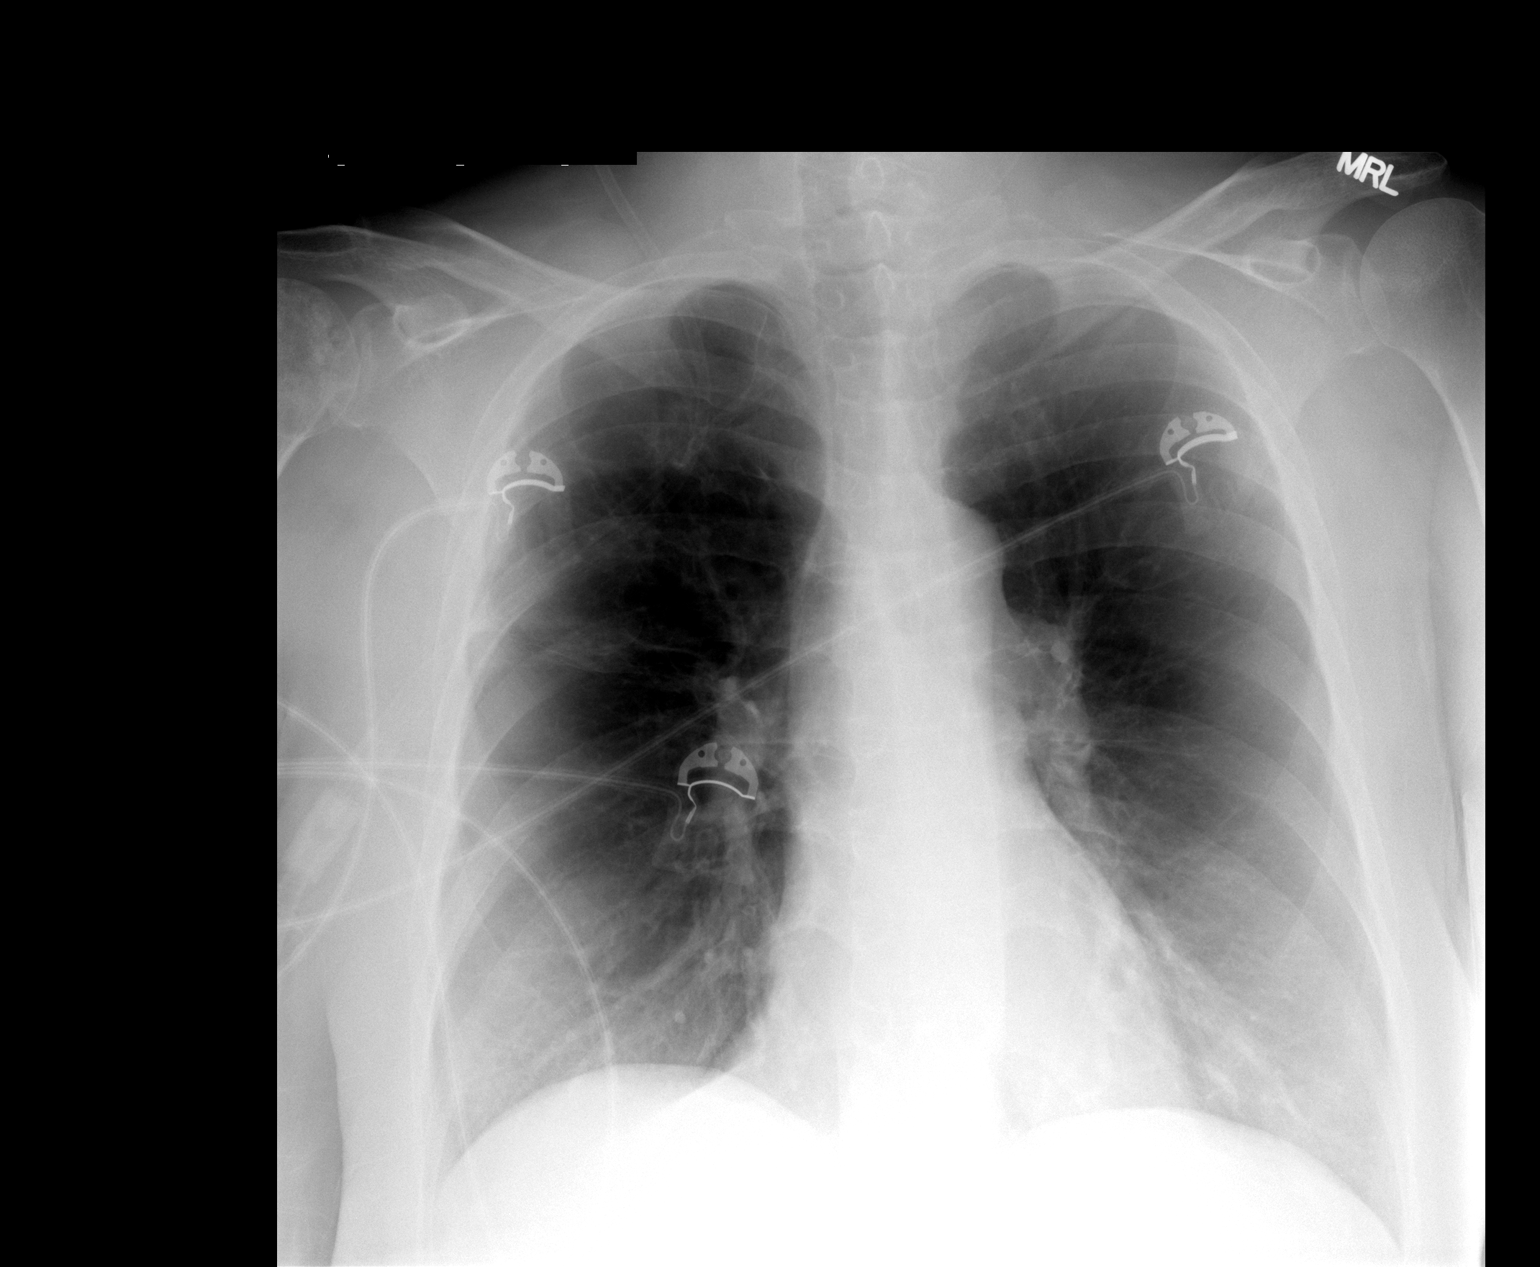

[2 of 2 positions shown; findings below may reference images not displayed]

FINDINGS: The right upper lobe scarring is stable.  Lungs otherwise
clear.  Normal heart size.  Bronchitic changes. Patchy sclerosis in
the right humeral head.
IMPRESSION: No active cardiopulmonary disease.  Chronic changes.

Patchy sclerosis in the right humeral head.  Avascular necrosis is
not excluded.

## 2012-04-16 IMAGING — CT CT MAXILLOFACIAL W/O CM
1 of 2 series · 16 of 30 positions shown, 20 images · non-contrast
Comparison: 10/11/2009

CLINICAL DATA: History of sinusitis

CT MAXILLOFACIAL WITHOUT CONTRAST
TECHNIQUE: Multidetector CT imaging of the maxillofacial
structures was performed. Multiplanar CT image reconstructions were
also generated.

[Series 3: ltd sinuses 3.0 h40s st · axial · 0.29mm/px · z∈[-274,-178]mm · 16 of 36 slices shown, 20 images]
[im 2/36  brain]
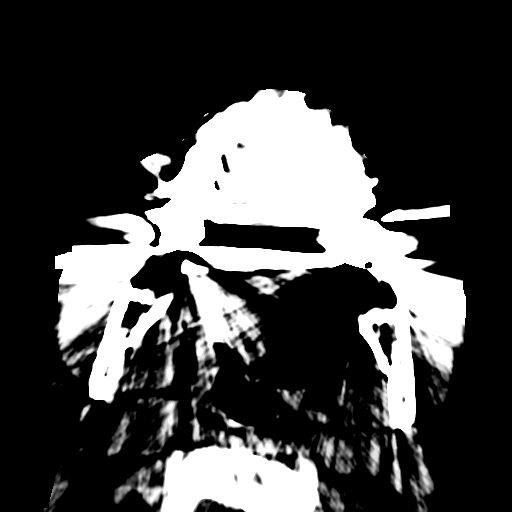
[im 2/36  bone]
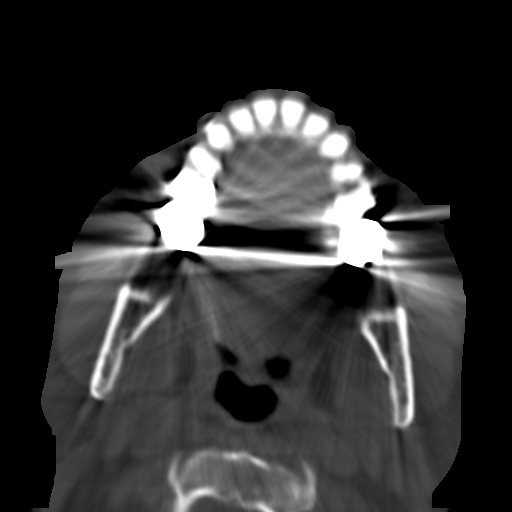
[im 5/36  bone]
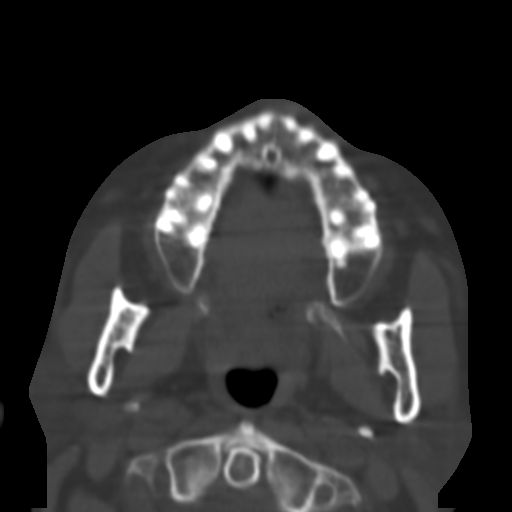
[im 6/36  bone]
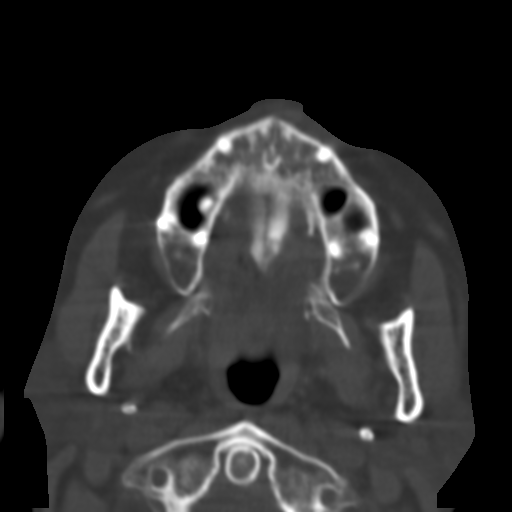
[im 9/36  bone]
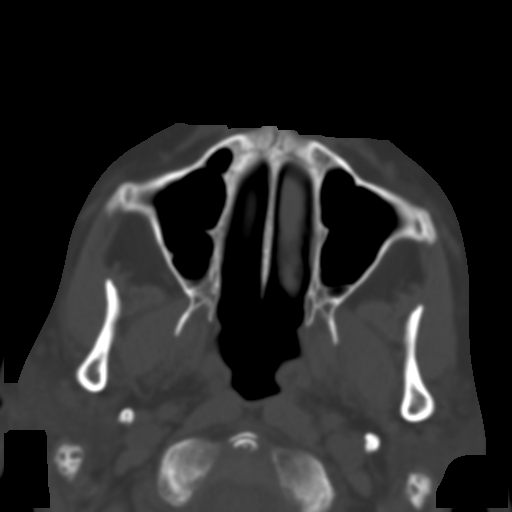
[im 10/36  brain]
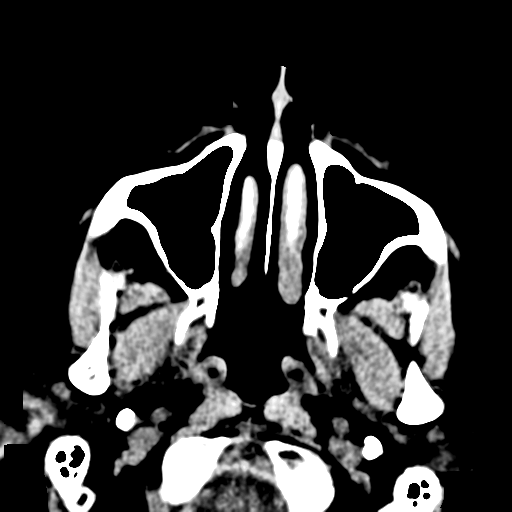
[im 10/36  bone]
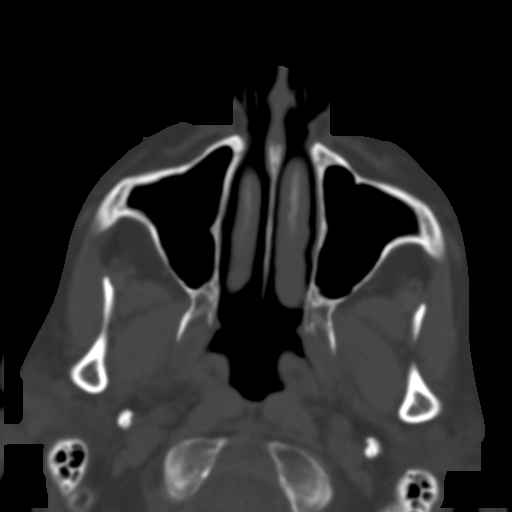
[im 13/36  bone]
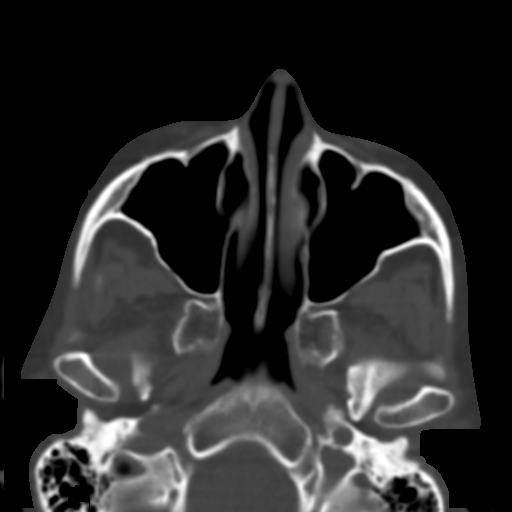
[im 15/36  bone]
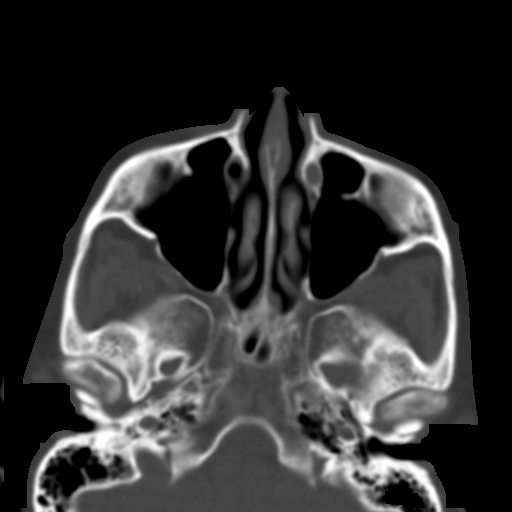
[im 17/36  bone]
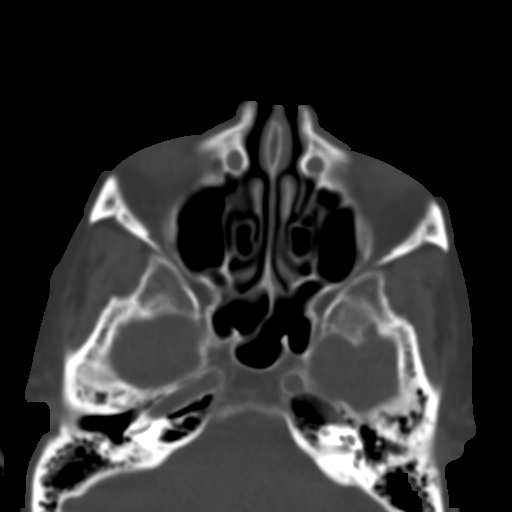
[im 19/36  brain]
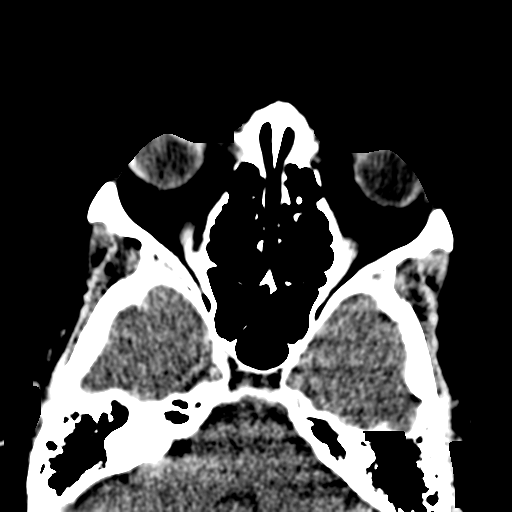
[im 19/36  bone]
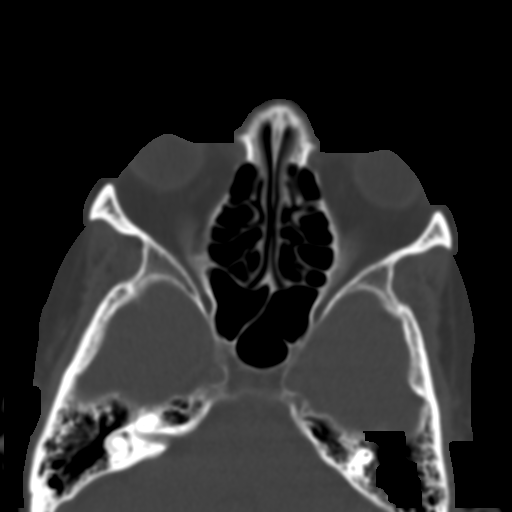
[im 22/36  bone]
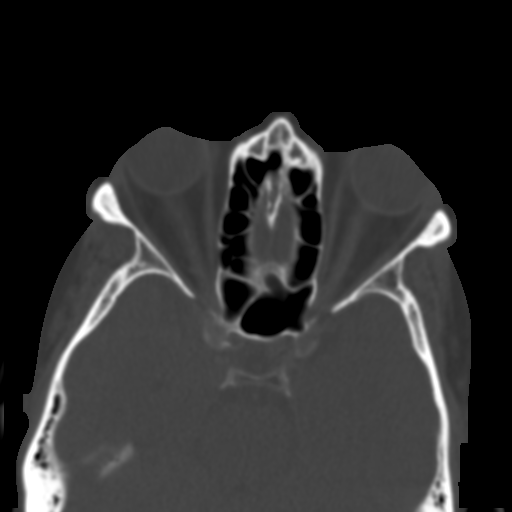
[im 23/36  bone]
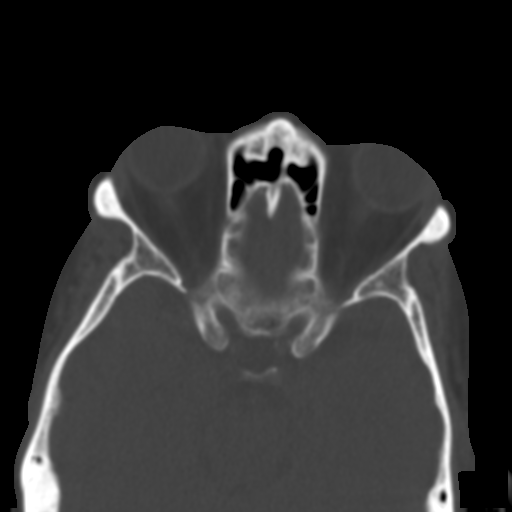
[im 26/36  bone]
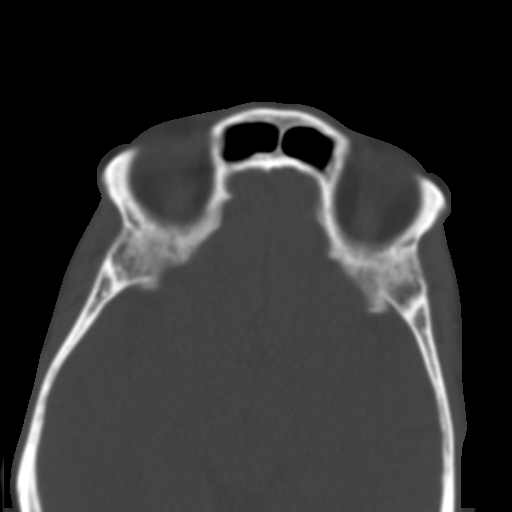
[im 27/36  brain]
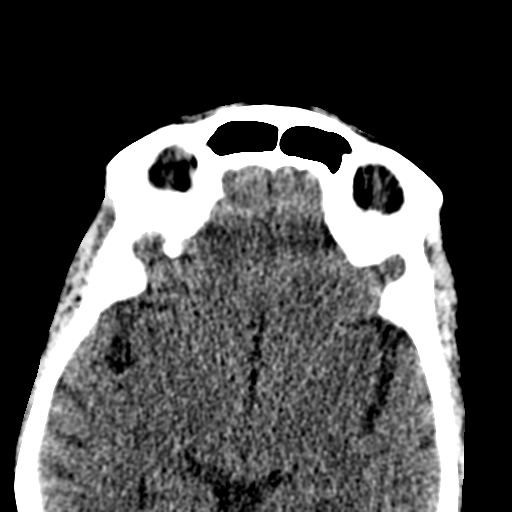
[im 27/36  bone]
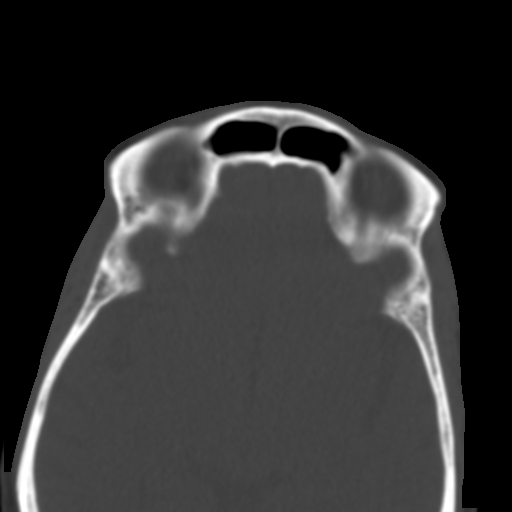
[im 30/36  bone]
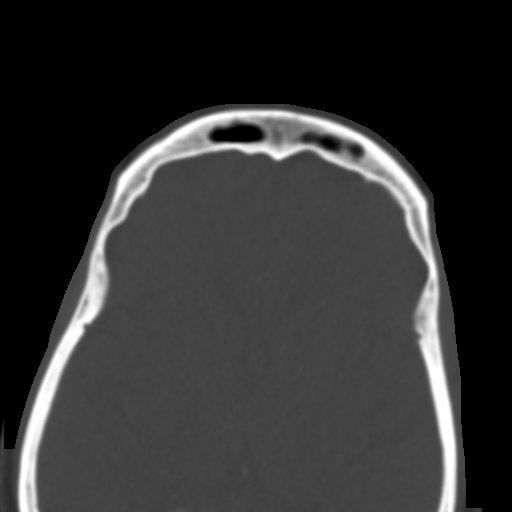
[im 31/36  bone]
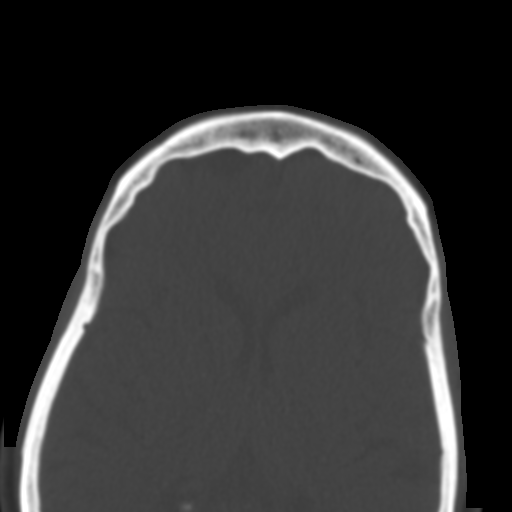
[im 34/36  bone]
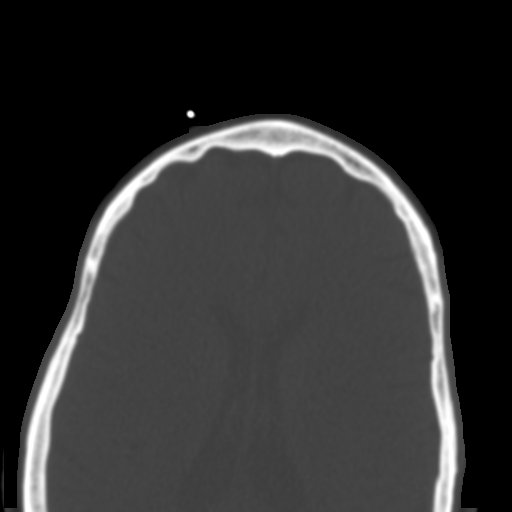

[16 of 30 positions shown; findings below may reference images not displayed]

FINDINGS: The maxillary sinuses are clear.

The ethmoid sinuses are clear.

The frontal sinuses are clear.

The sphenoid sinuses clear.

The bony confines of the paranasal sinuses are intact.  There is no
visible fracture or osseous destructive process.  Nasal septum is
midline.  No turbinate hypertrophy is evident.
IMPRESSION: Negative CT maxillofacial.  No change from prior study 1700.

## 2012-04-20 NOTE — Telephone Encounter (Signed)
Electronic refill request for pt's lorazepam 0.5mg  1 q8h prn #60 w/ 0 refills.  Per TP: okay to fill x1.  [employer], spoke with Market researcher and gave verbal authorization.  MAR updated

## 2012-04-26 ENCOUNTER — Telehealth: Payer: Self-pay | Admitting: Critical Care Medicine

## 2012-04-26 NOTE — Telephone Encounter (Addendum)
Pt c/o increased sob with exertion and at rest since Fri., 04/23/12. She says that she turns her O2 up to 2.5L. Nasal drainage increased and she has prod cough with clear mucus. Wheezing increased at times. She is using her SABA more often and wants to know how many times she can use her nebulizer to help. She does not think she needs to come in and says this can wait until Dr. [name] is back in the office tomorrow morning. Pt aware to seek emergency help if her breathing changes or worsens. Allergies  Allergen Reactions  . Esomeprazole Magnesium Other (See Comments)    NEXIUM - reaction > aggravated pt's Crohn's disease  . Other     Beans, Dander/Dust, Peas, Mushrooms  . Peanut-Containing Drug Products Other (See Comments)    unknown  . Shellfish Allergy Other (See Comments)    unknown

## 2012-04-27 ENCOUNTER — Ambulatory Visit (INDEPENDENT_AMBULATORY_CARE_PROVIDER_SITE_OTHER): Payer: Medicare Other | Admitting: Emergency Medicine

## 2012-04-27 ENCOUNTER — Encounter: Payer: Self-pay | Admitting: Emergency Medicine

## 2012-04-27 VITALS — BP 102/62 | HR 116 | Temp 98.4°F | Ht 61.0 in | Wt 129.6 lb

## 2012-04-27 DIAGNOSIS — J449 Chronic obstructive pulmonary disease, unspecified: Secondary | ICD-10-CM

## 2012-04-27 NOTE — Patient Instructions (Addendum)
We will start nasal saline washes daily until you return to see Dr [name] Start using Nasonex 2 sprays each side daily Continue your inhaled medications as you are taking them  Call our office if you start to wheeze or if your breathing worsens in any way Follow with Dr [name] in 2 weeks

## 2012-04-27 NOTE — Assessment & Plan Note (Signed)
Severe COPD, without evidence of an AE currently but at extremely high risk with her worsening rhinitis and sinus drainage. Nothing to support acute sinusitis either (yet).  - will start NSW + nasal steroid - continue same inhaled regimen - counseled her that she is high risk to flare and that she had to call us with any changes in her breathing - OV with dr [name] in 2 weeks

## 2012-04-27 NOTE — Telephone Encounter (Signed)
I spoke with pt and advised her of PW recs. She is scheduled to come in and see RB at 11:45 today. Pt needed nothing further.

## 2012-04-27 NOTE — Progress Notes (Signed)
Subjective:    Patient ID: [name], female    DOB: Nov 30, 1950, 61 y.o.   MRN: [id number]  HPI  61 y.o.  woman with severe COPD Golds Stage IV    7/31/13Post Hospital  Patient returns for a post hospital followup.  She was admitted 7/8-7/12  acute COPD exacerbation.  She was treated with IV antibiotics, nebulized bronchodilators, and steroids. She responded very quickly and was discharged on a prednisone taper.  Chest x-ray showed chronic changes without any acute process.  CTA neg for PE  TSH low with nml T3/4  Currently on off prednisone  Since discharge she is feeling better  She has had recurrent admissions over last 4 months for AECOPD We called pharmacy to verify med compliance > No refills on spiriva since 07/2011, previous refill 07/2010  Currently getting spiriva thru pt assistance program-delivered by mail to pt.  Regular refills on accolate  Currently in "doughnut hole" in May.  Lives with sister and mother. They do not smoke.  Ativan was added at discharge which is helping her anxiety  Now w/ [facility] support  She denies any chest pain, hemoptysis, abdominal pain, nausea, vomiting, or leg swelling.  01/02/2012 Pt seen by NP at last OV post hosp. Brovana added to neb med.   [facility] RN came once to see the pt.  Spiriva is through pt assistance program.  Ativan helps anxiety. Christoper Allegra is providing the brovana. Dyspnea is still an issue but is better.  Pt did cough up brown mucus.  Now finishing Bactrim Rx by GYN.  Now off pred. Pt has narrowing in esophagus. >pred taper   02/13/2012  6 week follow up for COPD  6 week follow up COPD - reports breathing is doing well.  no new complaints Went for esophageal dilatation few weeks ago, swallowing much better.  Breathing is at baseline. No flare of cough or wheezing.  Had flare of COPD flare with wheezing 2 weeks ago, called in steroid pack with resolution of symptoms [facility] checks on her monthly w/ phone call now.  Needs flu  shot.  Doing well on brovana and pulmicort nebs Twice daily   Wants refill of ativan .   03/30/2012 Pt had esophagus dilated in 9/13.  Pt had rectal bleeding>>> Dx per GI from Crohns dz. Copd f/u:  Cough is daily in AM clear pale yellow to brown mucus.  No flare of copd since last OV No real mucus.  On neb med BID. Pt denies any significant sore throat, nasal congestion or excess secretions, fever, chills, sweats, unintended weight loss, pleurtic or exertional chest pain, orthopnea PND, or leg swelling Pt denies any increase in rescue therapy over baseline, denies waking up needing it or having any early am or nocturnal exacerbations of coughing/wheezing/or dyspnea. 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  She follows with [facility], and recent nursing followup regarding wheeze, lots of nasal drainage green, bloody mucous. She does nasal saline spray but not NSW.  She was wheezing, but now her wheeze is better than 2 days ago. She was just started on celebrex and Plaquanil for new dx of RA (end November).  Maintenance is Spiriva + brovana + budesonide.  Past Medical History  Diagnosis Date  . Other diseases of vocal cords   . Osteoporosis, unspecified   . Other and unspecified hyperlipidemia   . Esophageal reflux   . Chronic airway obstruction, not elsewhere classified   . Regional enteritis of unspecified site   . 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 Type II or unspecified type diabetes mellitus without mention of complication, not stated as uncontrolled   . Crohn's disease     Ileostomy  . Fibroid   . Pelvic adhesions   . Anxiety   . Depression   . Hypertension   . Shortness of breath    . Pneumonia   . Asthma   . Emphysema   . Hematuria   . Cervical dysplasia      Family History  Problem Relation Age of Onset  . Osteoporosis Mother   . Kidney disease Mother   . Hypertension Sister   . Stroke Sister   . Diabetes Sister      History   Social History  . Marital Status: Divorced    Spouse Name: N/A    Number of Children: N/A  . Years of Education: N/A   Occupational History  . Not on file.   Social History Main Topics  . Smoking status: Former Smoker -- 1.0 packs/day for 32 years    Quit date: 05/27/2003  . Smokeless tobacco: Never Used  . Alcohol Use: 0.0 oz/week    0 Glasses of wine per week     Comment: occasional  . Drug Use: No  . Sexually Active: No   Other Topics Concern  . Not on file   Social History Narrative  . No narrative on file     Allergies  Allergen Reactions  . Esomeprazole Magnesium Other (See Comments)    NEXIUM - reaction > aggravated pt's Crohn's disease  . Other     Beans, Dander/Dust, Peas, Mushrooms  . Peanut-Containing Drug Products Other (See Comments)    unknown  . Shellfish Allergy Other (See Comments)    unknown     Outpatient Prescriptions Prior to Visit  Medication Sig Dispense Refill  . albuterol (PROVENTIL HFA;VENTOLIN HFA) 108 (90 BASE) MCG/ACT inhaler Inhale 2 puffs into the lungs every 6 (six) hours as needed. For shortness of breath      . arformoterol (BROVANA) 15 MCG/2ML NEBU Take 2 mLs (15 mcg total) by nebulization 2 (two) times daily.  120 mL  5  . aspirin 81 MG chewable tablet Chew 81 mg by mouth every Monday, Wednesday, and Friday.      . budesonide (PULMICORT) 0.25 MG/2ML nebulizer solution Take 0.25 mg by nebulization 2 (two) times daily.      . Cholecalciferol (VITAMIN D) 1000 UNITS capsule Take 1,000 Units by mouth daily.        . citalopram (CELEXA) 20 MG tablet Take 20 mg by mouth daily.       [name] diltiazem (CARDIZEM CD) 180 MG 24 hr capsule Take 180 mg by mouth 2 (two) times daily.        . famotidine (PEPCID) 20 MG tablet Take 1 tablet (20 mg total) by mouth 2 (two) times daily.  30 tablet  0  . furosemide (LASIX) 20 MG tablet Take 40 mg by mouth 2 (two) times daily. For fluid retention      . guaiFENesin (MUCINEX) 600 MG 12 hr tablet Take by mouth. 2 tablet in the am and 2 tablet in the afternoon      .  HYDROcodone-acetaminophen (VICODIN) 5-500 MG per tablet Take 1 tablet by mouth every 6 (six) hours as needed. For pain      . iron polysaccharides (NIFEREX) 150 MG capsule Take 150 mg by mouth 2 (two) times daily.      . Liraglutide (VICTOZA) 18 MG/3ML SOLN 1.8 units once daily      . LORazepam (ATIVAN) 0.5 MG tablet take 1 tablet by mouth every 8 hours if needed for anxiety  60 tablet  0  . metoprolol tartrate (LOPRESSOR) 12.5 mg TABS Take 0.5 tablets (12.5 mg total) by mouth 2 (two) times daily.  60 tablet  0  . Multiple Vitamin (MULTIVITAMIN WITH MINERALS) TABS Take 1 tablet by mouth daily.      [name] omeprazole (PRILOSEC) 40 MG capsule Take 40 mg by mouth 2 (two) times daily.       [name] Spacer/Aero-Holding Chambers (AEROCHAMBER PLUS) inhaler Use as instructed  1 each  0  . tiotropium (SPIRIVA) 18 MCG inhalation capsule Place 18 mcg into inhaler and inhale daily.      . zafirlukast (ACCOLATE) 20 MG tablet Take 1 tablet (20 mg total) by mouth daily.  30 tablet  6  . folic acid (FOLVITE) 1 MG tablet Take 1 mg by mouth daily.      . meloxicam (MOBIC) 15 MG tablet Take 1 tablet by mouth daily.            Objective:   Physical Exam  Filed Vitals:   04/27/12 1154  BP: 102/62  Pulse: 116  Temp: 98.4 F (36.9 C)  TempSrc: Oral  Height: 5\' 1"  (1.549 m)  Weight: 129 lb 9.6 oz (58.786 kg)  SpO2: 97%    Gen: Pleasant, well-nourished, in no distress,  normal affect, chronically ill appearing   ENT: No lesions,  mouth clear,  oropharynx clear, no postnasal drip  Neck: No JVD, no TMG, no carotid bruits  Lungs: No use of accessory muscles,  coarse BS, no  wheezes  Cardiovascular: RRR, heart sounds normal, no murmur or gallops, no peripheral edema  Musculoskeletal: No deformities, no cyanosis or clubbing  Neuro: alert, non focal  Skin: Warm, no lesions or rashes       Assessment & Plan:   COPD, Gold D Severe COPD, without evidence of an AE currently but at extremely high risk with her worsening rhinitis and sinus drainage. Nothing to support acute sinusitis either (yet).  - will start NSW + nasal steroid - continue same inhaled regimen - counseled her that she is high risk to flare and that she had to call us with any changes in her breathing - OV with dr [name] in 2 weeks    Updated Medication List Outpatient Encounter Prescriptions as of 04/27/2012  Medication Sig Dispense Refill  . albuterol (PROVENTIL HFA;VENTOLIN HFA) 108 (90 BASE) MCG/ACT inhaler Inhale 2 puffs into the lungs every 6 (six) hours as needed. For shortness of breath      . arformoterol (BROVANA) 15 MCG/2ML NEBU Take 2 mLs (15 mcg total) by nebulization 2 (two) times daily.  120 mL  5  . aspirin 81 MG chewable tablet Chew 81 mg by mouth every Monday, Wednesday, and Friday.      . budesonide (PULMICORT) 0.25 MG/2ML nebulizer solution Take 0.25 mg by nebulization 2 (two) times daily.      . Cholecalciferol (VITAMIN D) 1000 UNITS capsule Take 1,000 Units by mouth daily.        . citalopram (CELEXA) 20 MG tablet Take  20 mg by mouth daily.       [name] diltiazem (CARDIZEM CD) 180 MG 24 hr capsule Take 180 mg by mouth 2 (two) times daily.       . famotidine (PEPCID) 20 MG tablet Take 1 tablet (20 mg total) by mouth 2 (two) times daily.  30 tablet  0  . furosemide (LASIX) 20 MG tablet Take 40 mg by mouth 2 (two) times daily. For fluid retention      . guaiFENesin (MUCINEX) 600 MG 12 hr tablet Take by mouth. 2 tablet in the am and 2 tablet in the afternoon      . HYDROcodone-acetaminophen (VICODIN) 5-500 MG per tablet Take 1 tablet by mouth every 6 (six) hours as needed. For  pain      . iron polysaccharides (NIFEREX) 150 MG capsule Take 150 mg by mouth 2 (two) times daily.      . Liraglutide (VICTOZA) 18 MG/3ML SOLN 1.8 units once daily      . LORazepam (ATIVAN) 0.5 MG tablet take 1 tablet by mouth every 8 hours if needed for anxiety  60 tablet  0  . metoprolol tartrate (LOPRESSOR) 12.5 mg TABS Take 0.5 tablets (12.5 mg total) by mouth 2 (two) times daily.  60 tablet  0  . Multiple Vitamin (MULTIVITAMIN WITH MINERALS) TABS Take 1 tablet by mouth daily.      [name] omeprazole (PRILOSEC) 40 MG capsule Take 40 mg by mouth 2 (two) times daily.       [name] Spacer/Aero-Holding Chambers (AEROCHAMBER PLUS) inhaler Use as instructed  1 each  0  . tiotropium (SPIRIVA) 18 MCG inhalation capsule Place 18 mcg into inhaler and inhale daily.      . zafirlukast (ACCOLATE) 20 MG tablet Take 1 tablet (20 mg total) by mouth daily.  30 tablet  6  . folic acid (FOLVITE) 1 MG tablet Take 1 mg by mouth daily.      . meloxicam (MOBIC) 15 MG tablet Take 1 tablet by mouth daily.

## 2012-04-27 NOTE — Telephone Encounter (Signed)
She really needs OV ASAP today.  If noone available today:  Rx:   Prednisone 10mg  Take 4 for three days 3 for three days 2 for three days 1 for three days and stop  #30 Azithromycin 250mg  Take two once then one daily until gone #6  And then still needs OV later with me.   OR :  workin with me 12/4

## 2012-05-11 ENCOUNTER — Ambulatory Visit (INDEPENDENT_AMBULATORY_CARE_PROVIDER_SITE_OTHER): Payer: Medicare Other

## 2012-05-11 ENCOUNTER — Encounter: Payer: Self-pay | Admitting: Critical Care Medicine

## 2012-05-11 ENCOUNTER — Ambulatory Visit (INDEPENDENT_AMBULATORY_CARE_PROVIDER_SITE_OTHER): Payer: Medicare Other | Admitting: Critical Care Medicine

## 2012-05-11 VITALS — BP 110/62 | HR 108 | Temp 97.9°F | Ht 61.0 in | Wt 130.6 lb

## 2012-05-11 DIAGNOSIS — J449 Chronic obstructive pulmonary disease, unspecified: Secondary | ICD-10-CM

## 2012-05-11 DIAGNOSIS — M069 Rheumatoid arthritis, unspecified: Secondary | ICD-10-CM | POA: Insufficient documentation

## 2012-05-11 DIAGNOSIS — M810 Age-related osteoporosis without current pathological fracture: Secondary | ICD-10-CM

## 2012-05-11 NOTE — Patient Instructions (Addendum)
No change in medications. Return in        2 months 

## 2012-05-11 NOTE — Progress Notes (Signed)
Subjective:    Patient ID: [name], female    DOB: 1951-04-04, 61 y.o.   MRN: [id number]  HPI 61 y.o.  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  She follows with [facility], and recent nursing followup regarding wheeze, lots of nasal drainage green, bloody mucous. She does nasal saline spray but not NSW.  She was wheezing, but now her wheeze is better than 2 days ago. She was just started on celebrex and Plaquanil for new dx of RA (end November).  Maintenance is Spiriva + brovana + budesonide.    05/11/2012 Pt seen 12/5 for allergic rhinitis and no sinusitis . Rx neti pot and nasal steroids. Now is better No cough.  Notes some nasal drip.  Mucus in am is clear.  No real chest pain CAT Score 05/11/2012 04/27/2012 03/30/2012  Total CAT Score 14 24 21     Review of Systems 11 pt ros neg except per hpi    Objective:   Physical Exam  Filed Vitals:   05/11/12 1132  BP: 110/62  Pulse: 108  Temp: 97.9 F (36.6 C)  TempSrc: Oral  Height: 5\' 1"  (1.549 m)  Weight: 130 lb 9.6 oz (59.24 kg)  SpO2: 98%    Gen: Pleasant, well-nourished, in no distress,  normal affect  ENT: No lesions,  mouth clear,  oropharynx clear, no postnasal drip  Neck: No JVD, no TMG, no carotid bruits  Lungs: No use of accessory muscles, no dullness to percussion, clear without rales or rhonchi  Cardiovascular: RRR, heart sounds normal, no murmur or gallops, no peripheral edema  Abdomen: soft and NT, no HSM,  BS normal  Musculoskeletal: No deformities, no cyanosis or clubbing  Neuro: alert, non focal  Skin: Warm, no lesions or rashes      Assessment & Plan:   COPD, Gold D Severe chronic obstructive lung disease gold stage D. oxygen dependent and steroid dependent stable at this time Plan Maintain inhaled medications as prescribed Maintain prednisone as  prescribed Return 2 months   Updated Medication List Outpatient Encounter Prescriptions as of 05/11/2012  Medication Sig Dispense Refill  . albuterol (PROVENTIL HFA;VENTOLIN HFA) 108 (90 BASE) MCG/ACT inhaler Inhale 2 puffs into the lungs every 6 (six) hours as needed. For shortness of breath      . arformoterol (BROVANA) 15 MCG/2ML NEBU Take 2 mLs (15 mcg total) by nebulization 2 (two) times daily.  120 mL  5  . aspirin 81 MG chewable tablet Chew 81 mg by mouth every Monday, Wednesday, and Friday.      . budesonide (PULMICORT) 0.25 MG/2ML nebulizer solution Take 0.25 mg by nebulization 2 (two) times daily.      . celecoxib (CELEBREX) 200 MG capsule Take 200 mg by mouth daily.      . Cholecalciferol (VITAMIN D) 1000 UNITS capsule Take 1,000 Units by mouth daily.        . citalopram (CELEXA) 20 MG tablet Take 20 mg by mouth daily.       [name] diltiazem (CARDIZEM CD) 180 MG 24 hr capsule Take 180 mg by mouth 2 (two) times daily.       . famotidine (PEPCID) 20 MG tablet Take 1 tablet (20 mg total) by mouth 2 (two) times daily.  30 tablet  0  . furosemide (LASIX) 20 MG tablet Take 40 mg by mouth  2 (two) times daily. For fluid retention      . guaiFENesin (MUCINEX) 600 MG 12 hr tablet Take by mouth. 2 tablet in the am and 2 tablet in the afternoon      . HYDROcodone-acetaminophen (VICODIN) 5-500 MG per tablet Take 1 tablet by mouth every 6 (six) hours as needed. For pain      . Liraglutide (VICTOZA) 18 MG/3ML SOLN 1.8 units once daily      . LORazepam (ATIVAN) 0.5 MG tablet take 1 tablet by mouth every 8 hours if needed for anxiety  60 tablet  0  . metoprolol tartrate (LOPRESSOR) 12.5 mg TABS Take 0.5 tablets (12.5 mg total) by mouth 2 (two) times daily.  60 tablet  0  . Multiple Vitamin (MULTIVITAMIN WITH MINERALS) TABS Take 1 tablet by mouth daily.      [name] omeprazole (PRILOSEC) 40 MG capsule Take 40 mg by mouth 2 (two) times daily.       [name] Spacer/Aero-Holding Chambers (AEROCHAMBER PLUS) inhaler Use  as instructed  1 each  0  . tiotropium (SPIRIVA) 18 MCG inhalation capsule Place 18 mcg into inhaler and inhale daily.      . zafirlukast (ACCOLATE) 20 MG tablet Take 20 mg by mouth. Take 1 tablet twice a day by mouth.      . [DISCONTINUED] meloxicam (MOBIC) 15 MG tablet Take 1 tablet by mouth daily.      . [DISCONTINUED] zafirlukast (ACCOLATE) 20 MG tablet Take 1 tablet (20 mg total) by mouth daily.  30 tablet  6  . folic acid (FOLVITE) 1 MG tablet Take 1 mg by mouth daily.      . [DISCONTINUED] iron polysaccharides (NIFEREX) 150 MG capsule Take 150 mg by mouth 2 (two) times daily.

## 2012-05-11 NOTE — Assessment & Plan Note (Signed)
Severe chronic obstructive lung disease gold stage D. oxygen dependent and steroid dependent stable at this time Plan Maintain inhaled medications as prescribed Maintain prednisone as prescribed Return 2 months

## 2012-05-17 ENCOUNTER — Telehealth: Payer: Self-pay | Admitting: Critical Care Medicine

## 2012-05-17 NOTE — Telephone Encounter (Signed)
I updated the pt meds list. Pt also wanted to know could she use her brovana or pulmicort extra on days the she has some  Chest tightness. I advised the pt that is when she should use her albuterol inhaler. Pt sates understanding. Carron Curie, CMA

## 2012-05-26 DIAGNOSIS — K565 Intestinal adhesions [bands], unspecified as to partial versus complete obstruction: Secondary | ICD-10-CM

## 2012-05-26 DIAGNOSIS — N739 Female pelvic inflammatory disease, unspecified: Secondary | ICD-10-CM

## 2012-05-26 HISTORY — DX: Female pelvic inflammatory disease, unspecified: N73.9

## 2012-05-26 HISTORY — DX: Intestinal adhesions (bands), unspecified as to partial versus complete obstruction: K56.50

## 2012-05-27 ENCOUNTER — Ambulatory Visit (INDEPENDENT_AMBULATORY_CARE_PROVIDER_SITE_OTHER): Payer: Medicare Other | Admitting: Pulmonary Disease

## 2012-05-27 ENCOUNTER — Encounter: Payer: Self-pay | Admitting: Pulmonary Disease

## 2012-05-27 VITALS — BP 120/70 | HR 113 | Temp 97.3°F | Wt 132.0 lb

## 2012-05-27 DIAGNOSIS — J441 Chronic obstructive pulmonary disease with (acute) exacerbation: Secondary | ICD-10-CM

## 2012-05-27 MED ORDER — PREDNISONE 10 MG PO TABS: 10.0000 mg | ORAL_TABLET | Freq: Every day | ORAL | Status: DC

## 2012-05-27 MED ORDER — DOXYCYCLINE HYCLATE 100 MG PO TABS: 100.0000 mg | ORAL_TABLET | Freq: Two times a day (BID) | ORAL | Status: DC

## 2012-05-27 MED ORDER — CIPROFLOXACIN HCL 500 MG PO TABS: 500.0000 mg | ORAL_TABLET | Freq: Two times a day (BID) | ORAL | Status: DC

## 2012-05-27 NOTE — Progress Notes (Signed)
  Subjective:    Patient ID: [name], female    DOB: 07-09-1950, 61 y.o.   MRN: [id number]  HPI    Review of Systems  Constitutional: Negative for fever and unexpected weight change.  HENT: Positive for congestion. Negative for ear pain, nosebleeds, sore throat, rhinorrhea, sneezing, trouble swallowing, dental problem, postnasal drip and sinus pressure.   Eyes: Negative for redness and itching.  Respiratory: Positive for cough, shortness of breath and wheezing. Negative for chest tightness.   Cardiovascular: Negative for palpitations and leg swelling.  Gastrointestinal: Negative for nausea and vomiting.  Genitourinary: Negative for dysuria.  Musculoskeletal: Negative for joint swelling.  Skin: Negative for rash.  Neurological: Negative for headaches.  Hematological: Does not bruise/bleed easily.  Psychiatric/Behavioral: Negative for dysphoric mood. The patient is not nervous/anxious.        Objective:   Physical Exam        Assessment & Plan:

## 2012-05-27 NOTE — Progress Notes (Signed)
  Subjective:    Patient ID: [name], female    DOB: [birth year], 62 y.o.   MRN: [id number]  62 y.o. with h/o COPD FEV1 20%, with chronic hypoxic respiratory failure on home oxygen at 2L via [place], presents today as an acute visit for cough productive of green brown sputum, chest tightness and wheezing, dyspnea, she increased the oxygen to 3 L via [place] and increased albuterol use.   No recent immunosuppression   Previous microbiology reviewed BAL pos for Pseudomonas and Staph Aureus, sens to Bactrim and Tetracycline    Allergies  Allergen Reactions  . Esomeprazole Magnesium Other (See Comments)    NEXIUM - reaction > aggravated pt's Crohn's disease  . Other     Beans, Dander/Dust, Peas, Mushrooms  . Peanut-Containing Drug Products Other (See Comments)    unknown  . Shellfish Allergy Other (See Comments)    unknown    Past Medical History  Diagnosis Date  . Other diseases of vocal cords   . Osteoporosis, unspecified   . Other and unspecified hyperlipidemia   . Esophageal reflux   . Chronic airway obstruction, not elsewhere classified   . Regional enteritis of unspecified site   . 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 Type II or unspecified type diabetes mellitus without mention of complication, not stated as uncontrolled   . Crohn's disease     Ileostomy  . Fibroid   . Pelvic adhesions   . Anxiety   . Depression   . Hypertension   . Shortness of breath   . Pneumonia   . Asthma   . Emphysema   . Hematuria   . Cervical dysplasia   . Rheumatoid arthritis 03/2012    [place]   Family History  Problem Relation Age of Onset  . Osteoporosis Mother   . Kidney disease Mother   . Hypertension Sister   . Stroke Sister   . Diabetes Sister    ROS please see [facility]ins detailed note  Medications reviewed   BP 120/70  Pulse 113  Temp 97.3 F (36.3 C) (Oral)  Wt 132 lb (59.875 kg)  SpO2 92%  General: Comfortable,  HEENT ; pupils round and  reactive to light; mild nasal mucosa edema and erythema; retropharynx crowding with erythema, no edema or exudate; no cervical LAD  Cardiovascular: s1s2, no murmurs. Regular rate and rhythm.  Respiratory: Decreased breath sounds bilaterally with expiratory wheezing No increased work of breathing.  Abdomen: soft NT ND BS+.   Extremities: no pedal edema. Homans negative  Central nervous system: Alert and oriented No focal neurological deficits.    Assessment and plan  Acute COPD exacerbation; She will initiate prednisone taper and antibiotic therapy to cover for both Staph Aureus and Pseudomonas (Doxy and Cipro); to continue inhaled/nebulized therapy. I advised her to call back if not improving and to present to the ED with worsening symptoms.   General health up to date on flu and pneumococcus vaccine   F/U with Dr. [name]; already scheduled

## 2012-05-27 NOTE — Patient Instructions (Addendum)
Please initiate Antibiotics and Prednisone. Please continue your inhaled and nebulized medication.   Please call if not improving or present to the emergency room with worsening symptoms.   We will schedule a follow up appointment with Dr. [name] in 4-6 weeks.

## 2012-05-28 DIAGNOSIS — J441 Chronic obstructive pulmonary disease with (acute) exacerbation: Secondary | ICD-10-CM | POA: Insufficient documentation

## 2012-06-18 ENCOUNTER — Other Ambulatory Visit: Payer: Self-pay | Admitting: Adult Health

## 2012-06-18 MED ORDER — TIOTROPIUM BROMIDE MONOHYDRATE 18 MCG IN CAPS: 18.0000 ug | ORAL_CAPSULE | Freq: Every day | RESPIRATORY_TRACT | Status: DC

## 2012-06-18 MED ORDER — LORAZEPAM 0.5 MG PO TABS: 0.5000 mg | ORAL_TABLET | Freq: Three times a day (TID) | ORAL | Status: DC | PRN

## 2012-06-18 NOTE — Telephone Encounter (Signed)
Received faxed refill request for lorazepam and spiriva w/ a note from the pharmacist stating "we have never filled these medications for her; need directions and quantities for these - Thanks."  Lorazepam 0.5mg  q8h prn anxiety > last filled 11.19.13 #60 w/ 0 refills. Spiriva 1 capsule qd  Spiriva sent electronically to [facility] w/ 11 refills okayed per TP.

## 2012-06-24 ENCOUNTER — Telehealth: Payer: Self-pay | Admitting: Critical Care Medicine

## 2012-06-24 MED ORDER — DOXYCYCLINE HYCLATE 100 MG PO TABS: ORAL_TABLET | ORAL | Status: DC

## 2012-06-24 NOTE — Telephone Encounter (Signed)
[name] patient. Last saw Dr. Frederico Hamman on 05/27/12. Pt c/o cough with cleat thick mucus, nasal drainage that is yellow, increased sob, chest tightness, and wheezing. This all started last night. She denies any fever and states she can not make in today for an ov. She is aware [name] is out of the office and we will send this to our doc of the day, Dr. Maple Hudson for recs. CY, pls advise. Allergies  Allergen Reactions  . Esomeprazole Magnesium Other (See Comments)    NEXIUM - reaction > aggravated pt's Crohn's disease  . Other     Beans, Dander/Dust, Peas, Mushrooms  . Peanut-Containing Drug Products Other (See Comments)    unknown  . Shellfish Allergy Other (See Comments)    unknown

## 2012-06-24 NOTE — Telephone Encounter (Signed)
Per CY give doxycycline 100mg , 2 today then 1 daily #8, and mucinex with lots of liquids. Carron Curie, CMA Pt advised and rx sent. Carron Curie, CMA

## 2012-06-25 ENCOUNTER — Telehealth: Payer: Self-pay | Admitting: Critical Care Medicine

## 2012-06-25 MED ORDER — PREDNISONE 10 MG PO TABS: ORAL_TABLET | ORAL | Status: DC

## 2012-06-25 MED ORDER — DOXYCYCLINE HYCLATE 100 MG PO TABS: 100.0000 mg | ORAL_TABLET | Freq: Two times a day (BID) | ORAL | Status: DC

## 2012-06-25 NOTE — Telephone Encounter (Signed)
Called in pred/doxy

## 2012-06-28 ENCOUNTER — Telehealth: Payer: Self-pay | Admitting: Critical Care Medicine

## 2012-06-28 NOTE — Telephone Encounter (Signed)
Called pt to check on her status    Pt is iimproved.  She will obtain some Delsym

## 2012-07-07 ENCOUNTER — Encounter: Payer: Self-pay | Admitting: Critical Care Medicine

## 2012-07-07 ENCOUNTER — Ambulatory Visit (INDEPENDENT_AMBULATORY_CARE_PROVIDER_SITE_OTHER): Payer: Medicare Other | Admitting: Critical Care Medicine

## 2012-07-07 VITALS — BP 104/62 | HR 96 | Temp 97.8°F | Ht 61.0 in | Wt 128.0 lb

## 2012-07-07 DIAGNOSIS — J441 Chronic obstructive pulmonary disease with (acute) exacerbation: Secondary | ICD-10-CM

## 2012-07-07 NOTE — Assessment & Plan Note (Signed)
Recurrent Copd exac now improved Plan No change in inhaled med s

## 2012-07-07 NOTE — Patient Instructions (Signed)
No change in medications. Return in        2 months 

## 2012-07-07 NOTE — Progress Notes (Signed)
Subjective:    Patient ID: [name], female    DOB: [birth year], 62 y.o.   MRN: [id number]  HPI  62 y.o. 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  She follows with [facility], and recent nursing followup regarding wheeze, lots of nasal drainage green, bloody mucous. She does nasal saline spray but not NSW.  She was wheezing, but now her wheeze is better than 2 days ago. She was just started on celebrex and Plaquanil for new dx of RA (end November).  Maintenance is Spiriva + brovana + budesonide.    05/11/2012 Pt seen 12/5 for allergic rhinitis and no sinusitis . Rx neti pot and nasal steroids. Now is better No cough.  Notes some nasal drip.  Mucus in am is clear.  No real chest pain   07/07/2012 Since last ov rx doxy/pred.  Not fully better .  Mucus is white now. Mucus is bubbly. Pt is ?accolate use. Pt saw Albon 1/14:  sats low unless use 3L  Otherwise pt is doing well   CAT Score 07/07/2012 05/11/2012 04/27/2012 03/30/2012  Total CAT Score 21 14 24 21     Review of Systems  11 pt ros neg except per hpi    Objective:   Physical Exam   Filed Vitals:   07/07/12 0916  BP: 104/62  Pulse: 96  Temp: 97.8 F (36.6 C)  TempSrc: Oral  Height: 5\' 1"  (1.549 m)  Weight: 128 lb (58.06 kg)  SpO2: 95%    Gen: Pleasant, well-nourished, in no distress,  normal affect  ENT: No lesions,  mouth clear,  oropharynx clear, no postnasal drip  Neck: No JVD, no TMG, no carotid bruits  Lungs: No use of accessory muscles, no dullness to percussion, distant BS Cardiovascular: RRR, heart sounds normal, no murmur or gallops, no peripheral edema  Abdomen: soft and NT, no HSM,  BS normal  Musculoskeletal: No deformities, no cyanosis or clubbing  Neuro: alert, non focal  Skin: Warm, no lesions or rashes      Assessment & Plan:   No problem-specific  assessment & plan notes found for this encounter.   Updated Medication List Outpatient Encounter Prescriptions as of 07/07/2012  Medication Sig Dispense Refill  . albuterol (PROVENTIL HFA;VENTOLIN HFA) 108 (90 BASE) MCG/ACT inhaler Inhale 2 puffs into the lungs every 6 (six) hours as needed. For shortness of breath      . arformoterol (BROVANA) 15 MCG/2ML NEBU Take 2 mLs (15 mcg total) by nebulization 2 (two) times daily.  120 mL  5  . aspirin 81 MG chewable tablet Chew 81 mg by mouth every Monday, Wednesday, and Friday.      . budesonide (PULMICORT) 0.25 MG/2ML nebulizer solution Take 0.25 mg by nebulization 2 (two) times daily.      . celecoxib (CELEBREX) 200 MG capsule Take 200 mg by mouth daily.      . Cholecalciferol (VITAMIN D) 1000 UNITS capsule Take 1,000 Units by mouth daily.        . citalopram (CELEXA) 20 MG tablet Take 20 mg by mouth daily.       [name] diltiazem (CARDIZEM CD) 180 MG 24 hr capsule Take 180 mg by mouth 2 (two) times daily.       . famotidine (PEPCID) 20 MG tablet Take 1 tablet (20 mg total) by mouth 2 (two)  times daily.  30 tablet  0  . furosemide (LASIX) 20 MG tablet Take 40 mg by mouth 2 (two) times daily as needed. For fluid retention      . guaiFENesin (MUCINEX) 600 MG 12 hr tablet Take by mouth. 2 tablet in the am and 2 tablet in the afternoon      . HYDROcodone-acetaminophen (VICODIN) 5-500 MG per tablet Take 1 tablet by mouth every 6 (six) hours as needed. For pain      . hydroxychloroquine (PLAQUENIL) 200 MG tablet Take 1 tablet by mouth Twice daily.      . Liraglutide (VICTOZA) 18 MG/3ML SOLN 1.8 units once daily      . LORazepam (ATIVAN) 0.5 MG tablet Take 1 tablet (0.5 mg total) by mouth every 8 (eight) hours as needed for anxiety.  60 tablet  0  . metoprolol tartrate (LOPRESSOR) 12.5 mg TABS Take 0.5 tablets (12.5 mg total) by mouth 2 (two) times daily.  60 tablet  0  . Multiple Vitamin (MULTIVITAMIN WITH MINERALS) TABS Take 1 tablet by mouth daily.      [name]  omeprazole (PRILOSEC) 40 MG capsule Take 40 mg by mouth 2 (two) times daily.       [name] tiotropium (SPIRIVA) 18 MCG inhalation capsule Place 1 capsule (18 mcg total) into inhaler and inhale daily.  30 capsule  11  . zafirlukast (ACCOLATE) 20 MG tablet Take 20 mg by mouth daily.       [name] Spacer/Aero-Holding Chambers (AEROCHAMBER PLUS) inhaler Use as instructed  1 each  0  . [DISCONTINUED] ciprofloxacin (CIPRO) 500 MG tablet Take 1 tablet (500 mg total) by mouth 2 (two) times daily.  28 tablet  0  . [DISCONTINUED] doxycycline (VIBRA-TABS) 100 MG tablet Take 2 today then one daily until gone.  8 tablet  0  . [DISCONTINUED] doxycycline (VIBRA-TABS) 100 MG tablet Take 1 tablet (100 mg total) by mouth 2 (two) times daily.  28 tablet  0  . [DISCONTINUED] folic acid (FOLVITE) 1 MG tablet Take 1 mg by mouth daily.      . [DISCONTINUED] predniSONE (DELTASONE) 10 MG tablet Take 1 tablet (10 mg total) by mouth daily. Take 4 tablets by mouth for 3 days, then 2 tablets by mouth for 3 days, then 1 tablet for 3 days then stop  30 tablet  0  . [DISCONTINUED] predniSONE (DELTASONE) 10 MG tablet Take 4 for three days 3 for three days 2 for three days 1 for three days and stop  30 tablet  0   No facility-administered encounter medications on file as of 07/07/2012.

## 2012-07-15 ENCOUNTER — Telehealth: Payer: Self-pay | Admitting: Critical Care Medicine

## 2012-07-15 NOTE — Telephone Encounter (Signed)
Pt aware.

## 2012-07-15 NOTE — Telephone Encounter (Signed)
I spoke with pt. She stated she stopped the accolate on 07/07/12. She stated she has been doing well off of it and has not had any changed being off of it nor side effects coming off this medication. She stated she will call us back if she notices any changes. Will forward to Dr. [name] as an Lorain Childes

## 2012-07-15 NOTE — Telephone Encounter (Signed)
Stay off accolate

## 2012-07-19 ENCOUNTER — Other Ambulatory Visit: Payer: Self-pay | Admitting: Adult Health

## 2012-08-27 ENCOUNTER — Other Ambulatory Visit: Payer: Medicare Other

## 2012-08-27 ENCOUNTER — Telehealth: Payer: Self-pay | Admitting: *Deleted

## 2012-08-27 DIAGNOSIS — M810 Age-related osteoporosis without current pathological fracture: Secondary | ICD-10-CM

## 2012-08-27 LAB — CALCIUM: Calcium: 9.7 mg/dL (ref 8.4–10.5)

## 2012-08-27 LAB — CREATININE, SERUM: Creat: 1.04 mg/dL (ref 0.50–1.10)

## 2012-08-27 NOTE — Telephone Encounter (Signed)
Pt ready for Reclast. Will do labs today  Orders placed. There was a previous mix up with other Md drawing them and sending [place] results Reclast to be scheduled. Order to be faxed. Apt at [facility] Fri 09/03/12 at 9am. reminder of the Tyelnol ES TID starting the day before for 3 days pt informed KW

## 2012-09-02 ENCOUNTER — Other Ambulatory Visit (HOSPITAL_COMMUNITY): Payer: Self-pay | Admitting: *Deleted

## 2012-09-03 ENCOUNTER — Encounter (HOSPITAL_COMMUNITY)
Admission: RE | Admit: 2012-09-03 | Discharge: 2012-09-03 | Disposition: A | Discharge status: Home / Self Care | Payer: Medicare Other | Source: Ambulatory Visit | Attending: Gynecology | Admitting: Gynecology

## 2012-09-03 DIAGNOSIS — M810 Age-related osteoporosis without current pathological fracture: Secondary | ICD-10-CM | POA: Insufficient documentation

## 2012-09-03 MED ORDER — ZOLEDRONIC ACID 5 MG/100ML IV SOLN
INTRAVENOUS | Status: AC
  Filled 2012-09-03: qty 100

## 2012-09-03 MED ORDER — ZOLEDRONIC ACID 5 MG/100ML IV SOLN
5.0000 mg | Freq: Once | INTRAVENOUS | Status: AC
  Administered 2012-09-03: 5 mg via INTRAVENOUS

## 2012-09-12 ENCOUNTER — Other Ambulatory Visit: Payer: Self-pay | Admitting: Adult Health

## 2012-09-13 ENCOUNTER — Ambulatory Visit (INDEPENDENT_AMBULATORY_CARE_PROVIDER_SITE_OTHER): Payer: Medicare Other | Admitting: Gynecology

## 2012-09-13 ENCOUNTER — Encounter: Payer: Self-pay | Admitting: Gynecology

## 2012-09-13 DIAGNOSIS — R1032 Left lower quadrant pain: Secondary | ICD-10-CM

## 2012-09-13 LAB — URINALYSIS W MICROSCOPIC + REFLEX CULTURE
Bilirubin Urine: NEGATIVE
Nitrite: NEGATIVE
Protein, ur: 100 mg/dL — AB
Urobilinogen, UA: 0.2 mg/dL (ref 0.0–1.0)

## 2012-09-13 NOTE — Progress Notes (Addendum)
Patient presents complaining of left groin pain. Has been going on for some time and actually has seen an orthopedic person. They had prescribed a ankle brace which seemed to help with her groin/hip pain. Also notes that if she lays a certain way in bed it helps relieve the discomfort. No frequency dysuria urgency low back pain fever or chills.  Is status post LAVH LSO in the past. Did have an ultrasound in October 2013 by Dr. Eda Paschal for right sided pain which was negative.  Exam with Selena Batten assistant Spine straight without CVA tenderness. Abdomen soft with ostomy bag intact without acute changes of masses guarding rebound organomegaly. Left lower quadrant exam is negative. She is tender over the hip region on palpation. Lower extremities with straight leg raise causing some discomfort in the left hip. Distal pulses normal. No evidence of neuropathy.  Assessment and plan: Left groin/hip pain which I think is orthopedic. Recommend patient followup with her orthopedic doctor and she agrees to do so. Did have recent sigmoidoscopy by her gastroenterologist which was negative. I did a urinalysis which did show 3-6 WBC few bacteria. I do not think this is accounting for her pain but will culture and I discussed with her that we may call her if it does grow out bacteria to be treated with an antibiotic.

## 2012-09-13 NOTE — Patient Instructions (Signed)
Followup with her orthopedic physician if your pain continues

## 2012-09-13 NOTE — Addendum Note (Signed)
Addended by: Dayna Barker on: 09/13/2012 12:20 PM   Modules accepted: Orders

## 2012-09-16 LAB — URINE CULTURE

## 2012-09-20 ENCOUNTER — Other Ambulatory Visit: Payer: Self-pay | Admitting: Gynecology

## 2012-09-20 MED ORDER — AMOXICILLIN 500 MG PO CAPS: 500.0000 mg | ORAL_CAPSULE | Freq: Three times a day (TID) | ORAL | Status: DC

## 2012-09-24 ENCOUNTER — Telehealth: Payer: Self-pay | Admitting: Critical Care Medicine

## 2012-09-24 NOTE — Telephone Encounter (Signed)
noted 

## 2012-09-24 NOTE — Telephone Encounter (Signed)
Spoke with pt's nurse Vickey Huger She is calling with FYI for PW Pt has been very active this wk and now feeling very tired Cough is still prod with minimal clear sputum Mild inspiratory and expiratory wheezes No increased SOB BP low at 96/60- does not drop when she stands Pt has ov with TP Monday 09/27/12

## 2012-09-27 ENCOUNTER — Ambulatory Visit: Payer: Medicare Other | Admitting: Adult Health

## 2012-10-08 ENCOUNTER — Encounter: Payer: Self-pay | Admitting: Adult Health

## 2012-10-08 ENCOUNTER — Ambulatory Visit (INDEPENDENT_AMBULATORY_CARE_PROVIDER_SITE_OTHER): Payer: Medicare Other | Admitting: Adult Health

## 2012-10-08 VITALS — BP 98/52 | HR 107 | Temp 98.2°F | Ht 61.75 in | Wt 123.0 lb

## 2012-10-08 DIAGNOSIS — J449 Chronic obstructive pulmonary disease, unspecified: Secondary | ICD-10-CM

## 2012-10-08 DIAGNOSIS — J383 Other diseases of vocal cords: Secondary | ICD-10-CM

## 2012-10-08 DIAGNOSIS — K219 Gastro-esophageal reflux disease without esophagitis: Secondary | ICD-10-CM

## 2012-10-08 DIAGNOSIS — F411 Generalized anxiety disorder: Secondary | ICD-10-CM

## 2012-10-08 DIAGNOSIS — F419 Anxiety disorder, unspecified: Secondary | ICD-10-CM

## 2012-10-08 MED ORDER — AEROCHAMBER PLUS MISC: Status: DC

## 2012-10-08 NOTE — Assessment & Plan Note (Signed)
Under good control without flare of VCD

## 2012-10-08 NOTE — Patient Instructions (Addendum)
Continue on current meds  Keep up good work.  Follow up Dr. [name]  In 3 months

## 2012-10-08 NOTE — Progress Notes (Signed)
Subjective:    Patient ID: [name], female    DOB: 1951/05/07, 62 y.o.   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  She follows with [facility], and recent nursing followup regarding wheeze, lots of nasal drainage green, bloody mucous. She does nasal saline spray but not NSW.  She was wheezing, but now her wheeze is better than 2 days ago. She was just started on celebrex and Plaquanil for new dx of RA (end November).  Maintenance is Spiriva + brovana + budesonide.    05/11/2012 Pt seen 12/5 for allergic rhinitis and no sinusitis . Rx neti pot and nasal steroids. Now is better No cough.  Notes some nasal drip.  Mucus in am is clear.  No real chest pain   07/07/2012 Since last ov rx doxy/pred.  Not fully better .  Mucus is white now. Mucus is bubbly. Pt is ?accolate use. 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  Hx of RA on plaquenil . On no steroids .  Reports breathing is doing well with some occasional wheezing.  Takes claritin 10mg  daily for seasonal allergy symptoms Got reclast infusion recently.  Doing well , no flares , no steroids recently. No ER /hospital visits .    Review of Systems  11 pt ros neg except per hpi    Objective:   Physical Exam   Filed Vitals:   10/08/12 1033  BP: 98/52  Pulse: 107  Temp: 98.2 F (36.8 C)  TempSrc: Oral  Height: 5' 1.75" (1.568 m)  Weight: 123 lb (55.792 kg)  SpO2: 97%    Gen: Pleasant, well-nourished, in no distress,  normal affect  ENT: No lesions,  mouth clear,  oropharynx clear, no postnasal drip  Neck: No JVD, no  TMG, no carotid bruits  Lungs: No use of accessory muscles, no dullness to percussion, distant BS Cardiovascular: RRR, heart sounds normal, no murmur or gallops, no peripheral edema  Abdomen: soft and NT, no HSM,  BS normal  Musculoskeletal: No deformities, no cyanosis or clubbing  Neuro: alert, non focal  Skin: Warm, no lesions or rashes      Assessment & Plan:   No problem-specific assessment & plan notes found for this encounter.   Updated Medication List Outpatient Encounter Prescriptions as of 10/08/2012  Medication Sig Dispense Refill  . albuterol (PROVENTIL HFA;VENTOLIN HFA) 108 (90 BASE) MCG/ACT inhaler Inhale 2 puffs into the lungs every 6 (six) hours as needed. For shortness of breath      . amoxicillin (AMOXIL) 500 MG capsule Take 1 capsule (500 mg total) by mouth 3 (three) times daily.  9 capsule  0  . arformoterol (BROVANA) 15 MCG/2ML NEBU Take 2 mLs (15 mcg total) by nebulization 2 (two) times daily.  120 mL  5  . aspirin 81 MG chewable tablet Chew 81 mg by mouth every Monday, Wednesday, and Friday.      . budesonide (PULMICORT) 0.25 MG/2ML nebulizer solution Take 0.25 mg by nebulization 2 (two) times daily.      [name]  celecoxib (CELEBREX) 200 MG capsule Take 200 mg by mouth daily.      . Cholecalciferol (VITAMIN D) 1000 UNITS capsule Take 1,000 Units by mouth daily.        . citalopram (CELEXA) 20 MG tablet Take 20 mg by mouth daily.       [name] diltiazem (CARDIZEM CD) 180 MG 24 hr capsule Take 180 mg by mouth 2 (two) times daily.       . famotidine (PEPCID) 20 MG tablet Take 1 tablet (20 mg total) by mouth 2 (two) times daily.  30 tablet  0  . furosemide (LASIX) 20 MG tablet Take 40 mg by mouth 2 (two) times daily as needed. For fluid retention      . guaiFENesin (MUCINEX) 600 MG 12 hr tablet Take by mouth. 2 tablet in the am and 2 tablet in the afternoon      . HYDROcodone-acetaminophen (VICODIN) 5-500 MG per tablet Take 1 tablet by mouth every 6 (six) hours as needed. For  pain      . hydroxychloroquine (PLAQUENIL) 200 MG tablet Take 1 tablet by mouth Twice daily.      . Liraglutide (VICTOZA) 18 MG/3ML SOLN 1.8 units once daily      . LORazepam (ATIVAN) 0.5 MG tablet TAKE 1 TABLET EVERY 8 HOURS AS NEEDED FOR ANXIETY  60 tablet  0  . metoprolol tartrate (LOPRESSOR) 12.5 mg TABS Take 0.5 tablets (12.5 mg total) by mouth 2 (two) times daily.  60 tablet  0  . Multiple Vitamin (MULTIVITAMIN WITH MINERALS) TABS Take 1 tablet by mouth daily.      [name] omeprazole (PRILOSEC) 40 MG capsule Take 40 mg by mouth 2 (two) times daily.       [name] Spacer/Aero-Holding Chambers (AEROCHAMBER PLUS) inhaler Use as instructed  1 each  0  . tiotropium (SPIRIVA) 18 MCG inhalation capsule Place 1 capsule (18 mcg total) into inhaler and inhale daily.  30 capsule  11   No facility-administered encounter medications on file as of 10/08/2012.

## 2012-10-08 NOTE — Addendum Note (Signed)
Addended by: Boone Master E on: 10/08/2012 04:11 PM   Modules accepted: Orders

## 2012-10-08 NOTE — Assessment & Plan Note (Signed)
Doing exceptionally well.  Cont on current regimen.  follow up Dr. [name]  In 3 months and As needed

## 2012-10-08 NOTE — Assessment & Plan Note (Signed)
Under good control without flare  GERD diet  Cont on Pepcid daily

## 2012-10-08 NOTE — Assessment & Plan Note (Signed)
Under good control without flare  Same rx

## 2012-11-01 ENCOUNTER — Other Ambulatory Visit: Payer: Self-pay | Admitting: Adult Health

## 2012-11-05 ENCOUNTER — Telehealth: Payer: Self-pay | Admitting: Critical Care Medicine

## 2012-11-05 ENCOUNTER — Other Ambulatory Visit: Payer: Self-pay | Admitting: *Deleted

## 2012-11-05 MED ORDER — LORAZEPAM 0.5 MG PO TABS: 0.5000 mg | ORAL_TABLET | Freq: Three times a day (TID) | ORAL | Status: DC | PRN

## 2012-11-05 MED ORDER — METOPROLOL TARTRATE 25 MG PO TABS: 12.5000 mg | ORAL_TABLET | Freq: Two times a day (BID) | ORAL | Status: DC

## 2012-11-05 NOTE — Telephone Encounter (Signed)
Dup message °

## 2012-11-05 NOTE — Telephone Encounter (Signed)
Rx has been called in per MW. Pt is aware. Nothing further was needed. 

## 2012-11-05 NOTE — Telephone Encounter (Signed)
Ok to refill x one 

## 2012-11-05 NOTE — Telephone Encounter (Signed)
I spoke with pt and she is requesting a refill on ativan. Last refilled 09/12/12 #60 x 0 refill Since PW and TP is scheduled off will ask doc on call. Please advise MW thanks

## 2012-11-16 ENCOUNTER — Encounter: Payer: Self-pay | Admitting: Internal Medicine

## 2012-11-29 ENCOUNTER — Other Ambulatory Visit: Payer: Self-pay | Admitting: Internal Medicine

## 2012-11-29 ENCOUNTER — Other Ambulatory Visit: Payer: Self-pay | Admitting: *Deleted

## 2012-11-29 ENCOUNTER — Other Ambulatory Visit: Payer: Self-pay | Admitting: Adult Health

## 2012-11-29 MED ORDER — DILTIAZEM HCL ER COATED BEADS 180 MG PO CP24: 180.0000 mg | ORAL_CAPSULE | Freq: Two times a day (BID) | ORAL | Status: DC

## 2012-12-01 ENCOUNTER — Other Ambulatory Visit: Payer: Self-pay | Admitting: Adult Health

## 2012-12-01 MED ORDER — BUDESONIDE 0.25 MG/2ML IN SUSP: 0.2500 mg | Freq: Two times a day (BID) | RESPIRATORY_TRACT | Status: DC

## 2012-12-01 MED ORDER — LORAZEPAM 0.5 MG PO TABS: 0.5000 mg | ORAL_TABLET | Freq: Three times a day (TID) | ORAL | Status: DC | PRN

## 2012-12-01 NOTE — Telephone Encounter (Signed)
Dr [name], please advise if this is okay to refill thanks!

## 2012-12-01 NOTE — Telephone Encounter (Signed)
Received faxed refill request for Budesonide 0.25mg  neb soln BID from:  [facility]  [address]   [place] [address] Phone [phone] Fax [phone]  Last ov 5.16.14 w/ TP Per the received fax, this [facility] pharmacy does accept Surescripts Refill sent electronically for 11 additional refills

## 2012-12-01 NOTE — Addendum Note (Signed)
Addended by: Caryl Ada on: 12/01/2012 04:03 PM   Modules accepted: Orders

## 2012-12-01 NOTE — Telephone Encounter (Signed)
Ok to refill 

## 2012-12-08 ENCOUNTER — Telehealth: Payer: Self-pay | Admitting: Critical Care Medicine

## 2012-12-08 NOTE — Telephone Encounter (Signed)
I have placed this in Dr. [name] look at. Will forward to Crystal so he is aware

## 2012-12-14 ENCOUNTER — Other Ambulatory Visit: Payer: Self-pay | Admitting: Internal Medicine

## 2012-12-14 NOTE — Telephone Encounter (Signed)
Medical alert certificate completed and signed by Dr. [name] for [employer].  Form placed at front for pick up.  Pt aware.

## 2012-12-15 ENCOUNTER — Telehealth: Payer: Self-pay | Admitting: Critical Care Medicine

## 2012-12-15 MED ORDER — TIOTROPIUM BROMIDE MONOHYDRATE 18 MCG IN CAPS: 18.0000 ug | ORAL_CAPSULE | Freq: Every day | RESPIRATORY_TRACT | Status: DC

## 2012-12-15 NOTE — Telephone Encounter (Signed)
Spiriva pt assistance form completed and placed in PW's folder along with spiriva rx for signature.

## 2012-12-16 NOTE — Telephone Encounter (Signed)
Pt returned call. I advised her re: update on application / rx / forms. She is aware this in being processed. Nothing further needed at this time per pt. [name]

## 2012-12-16 NOTE — Telephone Encounter (Signed)
LMTCB

## 2012-12-16 NOTE — Telephone Encounter (Signed)
Pt assistance application faxed back to [employer] along with financial documents and spiriva rx. I have placed this in Libby's office with the Pt Assistance Forms. [email or web address] on pt's home and cell #s to inform her of this.

## 2012-12-17 NOTE — Telephone Encounter (Signed)
Lorazepam rx was sent on 12/01/12 # 60 x 0. Wondering if pt has already ran out of rx?  If so, how frequently is she taking this? Called, spoke with pt who reports she is in the process of moving.   She didn't realize she still had rx for this. Pt states she has now found the lorazepam and doesn't need new rx for this at this time. Advised would decline this rx and to call back when needed. She verbalized understanding and voiced no further questions or concerns at this time.

## 2012-12-31 ENCOUNTER — Telehealth: Payer: Self-pay | Admitting: Critical Care Medicine

## 2012-12-31 MED ORDER — ARFORMOTEROL TARTRATE 15 MCG/2ML IN NEBU: 15.0000 ug | INHALATION_SOLUTION | Freq: Two times a day (BID) | RESPIRATORY_TRACT | Status: DC

## 2012-12-31 NOTE — Telephone Encounter (Signed)
I called [facility] and gave VO for pt brovana. Nothing further was needed

## 2013-01-10 ENCOUNTER — Telehealth: Payer: Self-pay | Admitting: Critical Care Medicine

## 2013-01-10 DIAGNOSIS — J449 Chronic obstructive pulmonary disease, unspecified: Secondary | ICD-10-CM

## 2013-01-10 NOTE — Telephone Encounter (Signed)
Would you be okay with ordering this for the pt? Thanks!

## 2013-01-11 NOTE — Telephone Encounter (Signed)
This is ok

## 2013-01-11 NOTE — Telephone Encounter (Signed)
Pt advised that order sent to [facility] for oxygen for travel use

## 2013-01-14 ENCOUNTER — Other Ambulatory Visit: Payer: Self-pay | Admitting: *Deleted

## 2013-01-14 MED ORDER — LORAZEPAM 0.5 MG PO TABS: 0.5000 mg | ORAL_TABLET | Freq: Three times a day (TID) | ORAL | Status: DC | PRN

## 2013-01-17 ENCOUNTER — Encounter: Payer: Self-pay | Admitting: Critical Care Medicine

## 2013-01-19 ENCOUNTER — Encounter: Payer: Self-pay | Admitting: Critical Care Medicine

## 2013-01-19 ENCOUNTER — Ambulatory Visit (INDEPENDENT_AMBULATORY_CARE_PROVIDER_SITE_OTHER): Payer: Medicare Other | Admitting: Critical Care Medicine

## 2013-01-19 VITALS — BP 110/72 | HR 80 | Temp 97.7°F | Ht 61.0 in | Wt 121.6 lb

## 2013-01-19 DIAGNOSIS — J438 Other emphysema: Secondary | ICD-10-CM

## 2013-01-19 DIAGNOSIS — J449 Chronic obstructive pulmonary disease, unspecified: Secondary | ICD-10-CM

## 2013-01-19 DIAGNOSIS — J9610 Chronic respiratory failure, unspecified whether with hypoxia or hypercapnia: Secondary | ICD-10-CM | POA: Insufficient documentation

## 2013-01-19 MED ORDER — MOMETASONE FUROATE 50 MCG/ACT NA SUSP: 2.0000 | Freq: Every day | NASAL | Status: DC

## 2013-01-19 NOTE — Progress Notes (Signed)
Subjective:    Patient ID: [name], female    DOB: 03-24-1951, 62 y.o.   MRN: [id number]  HPI  62 y.o.[name]  woman with severe COPD Golds Stage D   CAT Score 01/19/2013 07/07/2012 05/11/2012 04/27/2012  Total CAT Score 19 21 14 24    01/19/2013 Chief Complaint  Patient presents with  . 3 month follow up    Breathing doing well overall.  Is having more chest tightness over the past couple of weeks, fatigued, some wheezing, and dry cough qhs.  Used nebs more frequently over the past few weeks.  Has been under stress over these past 2 wks.  Mother passed away this past month.  Lots of transitions. Pt has had a lot of stress.  No excess mucus except min clear in the AM.  Dry cough qhs Now in assisted living center  Review of Systems  11 pt ros neg except per hpi    Objective:   Physical Exam   Filed Vitals:   01/19/13 1007  BP: 110/72  Pulse: 80  Temp: 97.7 F (36.5 C)  TempSrc: Oral  Height: 5\' 1"  (1.549 m)  Weight: 121 lb 9.6 oz (55.157 kg)  SpO2: 97%    Gen: Pleasant, well-nourished, in no distress,  normal affect  ENT: No lesions,  mouth clear,  oropharynx clear, no postnasal drip  Neck: No JVD, no TMG, no carotid bruits  Lungs: No use of accessory muscles, no dullness to percussion, distant BS Cardiovascular: RRR, heart sounds normal, no murmur or gallops, no peripheral edema  Abdomen: soft and NT, no HSM,  BS normal  Musculoskeletal: No deformities, no cyanosis or clubbing  Neuro: alert, non focal  Skin: Warm, no lesions or rashes      Assessment & Plan:   Gold stage D. 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 COPD with asthmatic bronchitic and emphysematous components and chronic respiratory failure stable at this time Plan Maintain inhaled medications as prescribed Maintain oxygen therapy as prescribed  Chronic respiratory failure Chronic respiratory failure on the basis of COPD Maintain oxygen    Updated  Medication List Outpatient Encounter Prescriptions as of 01/19/2013  Medication Sig Dispense Refill  . arformoterol (BROVANA) 15 MCG/2ML NEBU Take 2 mLs (15 mcg total) by nebulization 2 (two) times daily.  120 mL  5  . budesonide (PULMICORT) 0.25 MG/2ML nebulizer solution Take 2 mLs (0.25 mg total) by nebulization 2 (two) times daily. Dx 496  120 mL  11  . celecoxib (CELEBREX) 200 MG capsule Take 200 mg by mouth daily.      . Cholecalciferol (VITAMIN D) 1000 UNITS capsule Take 1,000 Units by mouth daily.        . citalopram (CELEXA) 20 MG tablet Take 20 mg by mouth daily.       [name] diltiazem (CARDIZEM CD) 180 MG 24 hr capsule Take 1 capsule (180 mg total) by mouth 2 (two) times daily.  60 capsule  2  . famotidine (PEPCID) 20 MG tablet Take 1 tablet (20 mg total) by mouth 2 (two) times daily.  30 tablet  0  . furosemide (LASIX) 20 MG tablet TAKE 1 TABLET TWICE A DAY AS NEEDED  60 tablet  2  . guaiFENesin (MUCINEX) 600 MG 12 hr tablet Take by mouth. 2 tablet in the am and 2 tablet in the afternoon      . HYDROcodone-acetaminophen (VICODIN) 5-500 MG per tablet Take 1 tablet by mouth every 6 (six) hours as  needed. For pain      . hydroxychloroquine (PLAQUENIL) 200 MG tablet Take 1 tablet by mouth Twice daily.      . Liraglutide (VICTOZA) 18 MG/3ML SOLN 1.8 units once daily      . LORazepam (ATIVAN) 0.5 MG tablet Take 1 tablet (0.5 mg total) by mouth every 8 (eight) hours as needed for anxiety.  60 tablet  0  . metoprolol tartrate (LOPRESSOR) 25 MG tablet Take 0.5 tablets (12.5 mg total) by mouth 2 (two) times daily.  30 tablet  3  . Multiple Vitamin (MULTIVITAMIN WITH MINERALS) TABS Take 1 tablet by mouth daily.      [name] omeprazole (PRILOSEC) 40 MG capsule Take 40 mg by mouth 2 (two) times daily.       . rosuvastatin (CRESTOR) 5 MG tablet Take 5 mg by mouth 3 (three) times a week.      [name] Spacer/Aero-Holding Chambers (AEROCHAMBER PLUS) inhaler Use as instructed  1 each  0  . tiotropium (SPIRIVA) 18 MCG  inhalation capsule Place 1 capsule (18 mcg total) into inhaler and inhale daily.  90 capsule  3  . mometasone (NASONEX) 50 MCG/ACT nasal spray Place 2 sprays into the nose daily.  7 g  1   No facility-administered encounter medications on file as of 01/19/2013.

## 2013-01-19 NOTE — Assessment & Plan Note (Signed)
Gold stage D. COPD with asthmatic bronchitic and emphysematous components and chronic respiratory failure stable at this time Plan Maintain inhaled medications as prescribed Maintain oxygen therapy as prescribed

## 2013-01-19 NOTE — Assessment & Plan Note (Signed)
Chronic respiratory failure on the basis of COPD Maintain oxygen

## 2013-01-19 NOTE — Patient Instructions (Addendum)
Flu vaccine in September Nasonex two puff  Each nostril use samples No other medication changes Return 4 months

## 2013-02-04 ENCOUNTER — Telehealth: Payer: Self-pay | Admitting: Critical Care Medicine

## 2013-02-04 MED ORDER — DOXYCYCLINE HYCLATE 100 MG PO TABS: 100.0000 mg | ORAL_TABLET | Freq: Two times a day (BID) | ORAL | Status: DC

## 2013-02-04 MED ORDER — PREDNISONE 10 MG PO TABS: ORAL_TABLET | ORAL | Status: DC

## 2013-02-04 NOTE — Telephone Encounter (Signed)
Spoke with patient advised if recs Rx has been sent to verified pharmacy Nothing further needed at this time

## 2013-02-04 NOTE — Telephone Encounter (Signed)
Doxycycline 100mg  Twice daily  For 7 days  #14  Prednisone taper over next week  Prednisone 10mg  4 tabs for 2 days, then 3 tabs for 2 days, 2 tabs for 2 days, then 1 tab for 2 days, then stop #20  follow up as planned and As needed   Please contact office for sooner follow up if symptoms do not improve or worsen or seek emergency care

## 2013-02-04 NOTE — Telephone Encounter (Signed)
Spoke with the pt She is c/o wheezing, increased SOB, and prod cough with large amounts of thick, clear sputum x 4 days--Denies any CP, f/c/s or other co's  Too late for an appt today She would like something to be called in Please advise, thanks! Allergies  Allergen Reactions  . Esomeprazole Magnesium Other (See Comments)    NEXIUM - reaction > aggravated pt's Crohn's disease  . Other     Beans, Dander/Dust, Peas, Mushrooms  . Peanut-Containing Drug Products Other (See Comments)    unknown  . Shellfish Allergy Other (See Comments)    unknown

## 2013-02-08 ENCOUNTER — Encounter: Payer: Self-pay | Admitting: *Deleted

## 2013-02-08 ENCOUNTER — Encounter: Payer: Self-pay | Admitting: Cardiovascular Disease

## 2013-02-09 ENCOUNTER — Encounter: Payer: Self-pay | Admitting: Cardiovascular Disease

## 2013-02-09 ENCOUNTER — Ambulatory Visit (INDEPENDENT_AMBULATORY_CARE_PROVIDER_SITE_OTHER): Payer: Medicare Other | Admitting: Cardiovascular Disease

## 2013-02-09 VITALS — BP 110/70 | HR 88 | Resp 16 | Ht 61.0 in | Wt 117.6 lb

## 2013-02-09 DIAGNOSIS — R000 Tachycardia, unspecified: Secondary | ICD-10-CM

## 2013-02-09 DIAGNOSIS — I498 Other specified cardiac arrhythmias: Secondary | ICD-10-CM

## 2013-02-09 NOTE — Patient Instructions (Addendum)
Dr C wants you to follow-up in 1 year. You will receive a reminder letter in the mail two months in advance. If you don't receive a letter, please call our office to schedule the follow-up appointment.  

## 2013-02-15 ENCOUNTER — Ambulatory Visit (INDEPENDENT_AMBULATORY_CARE_PROVIDER_SITE_OTHER): Payer: Medicare Other

## 2013-02-15 ENCOUNTER — Ambulatory Visit (INDEPENDENT_AMBULATORY_CARE_PROVIDER_SITE_OTHER): Payer: Medicare Other | Admitting: Critical Care Medicine

## 2013-02-15 DIAGNOSIS — J439 Emphysema, unspecified: Secondary | ICD-10-CM

## 2013-02-15 DIAGNOSIS — Z23 Encounter for immunization: Secondary | ICD-10-CM

## 2013-02-15 DIAGNOSIS — J438 Other emphysema: Secondary | ICD-10-CM

## 2013-02-15 NOTE — Progress Notes (Signed)
Spirometry before and after done today. 

## 2013-02-17 ENCOUNTER — Encounter: Payer: Self-pay | Admitting: Critical Care Medicine

## 2013-02-17 NOTE — Assessment & Plan Note (Signed)
Recently this has been a lot less of a problem than  in the past. This may actually reflect better control of her pulmonary problems since the atrial tachycardia is almost certainly related to chronic lung disease. It is likely that the episodes of atrial tachycardia with increasing frequency if she has episodes of respiratory insufficiency. Anticoagulations not indicated. Also she does not have any features to suggest active right heart failure.

## 2013-02-17 NOTE — Progress Notes (Signed)
Patient ID: [name], female   DOB: 1950-12-31, 62 y.o.   MRN: [id number]    Reason for office visit Atrial tachycardia   Is more so severe, oxygen dependent chronic obstructive lung disease as well as rheumatoid arthritis and Crohn's disease complicated by anemia and a permanent colostomy. She has had frequent episodes of paroxysmal atrial tachycardia usually associated with episodes of respiratory failure secondary to exacerbations of her chronic lung problems. She has no complaints of palpitations recently. She has minimal if any ankle edema. She has not had any recent issues related to gastrointestinal bleeding. In fact she states that she feels quite well.   An echocardiogram last performed in July of last year showed a hyperdynamic left ventricle without overt findings to suggest diastolic dysfunction. She had mild to moderate tricuspid insufficiency but the TR jet does not suggest substantial pulmonary hypertension   Allergies  Allergen Reactions  . Esomeprazole Magnesium Other (See Comments)    NEXIUM - reaction > aggravated pt's Crohn's disease  . Other     Beans, Dander/Dust, Peas, Mushrooms  . Peanut-Containing Drug Products Other (See Comments)    unknown  . Shellfish Allergy Other (See Comments)    unknown  . Surgical Lubricant     Current Outpatient Prescriptions  Medication Sig Dispense Refill  . arformoterol (BROVANA) 15 MCG/2ML NEBU Take 2 mLs (15 mcg total) by nebulization 2 (two) times daily.  120 mL  5  . budesonide (PULMICORT) 0.25 MG/2ML nebulizer solution Take 2 mLs (0.25 mg total) by nebulization 2 (two) times daily. Dx 496  120 mL  11  . celecoxib (CELEBREX) 200 MG capsule Take 200 mg by mouth daily.      . Cholecalciferol (VITAMIN D) 1000 UNITS capsule Take 1,000 Units by mouth daily.        . citalopram (CELEXA) 20 MG tablet Take 20 mg by mouth daily.       [name] diltiazem (CARDIZEM CD) 180 MG 24 hr capsule Take 1 capsule (180 mg total) by mouth 2  (two) times daily.  60 capsule  2  . doxycycline (VIBRA-TABS) 100 MG tablet Take 1 tablet (100 mg total) by mouth 2 (two) times daily.  14 tablet  0  . famotidine (PEPCID) 20 MG tablet Take 1 tablet (20 mg total) by mouth 2 (two) times daily.  30 tablet  0  . furosemide (LASIX) 20 MG tablet TAKE 1 TABLET TWICE A DAY AS NEEDED  60 tablet  2  . guaiFENesin (MUCINEX) 600 MG 12 hr tablet Take by mouth. 2 tablet in the am and 2 tablet in the afternoon      . HYDROcodone-acetaminophen (VICODIN) 5-500 MG per tablet Take 1 tablet by mouth every 6 (six) hours as needed. For pain      . hydroxychloroquine (PLAQUENIL) 200 MG tablet Take 1 tablet by mouth Twice daily.      . Liraglutide (VICTOZA) 18 MG/3ML SOLN 1.8 units once daily      . LORazepam (ATIVAN) 0.5 MG tablet Take 1 tablet (0.5 mg total) by mouth every 8 (eight) hours as needed for anxiety.  60 tablet  0  . metoprolol tartrate (LOPRESSOR) 25 MG tablet Take 0.5 tablets (12.5 mg total) by mouth 2 (two) times daily.  30 tablet  3  . mometasone (NASONEX) 50 MCG/ACT nasal spray Place 2 sprays into the nose daily.  7 g  1  . Multiple Vitamin (MULTIVITAMIN WITH MINERALS) TABS Take 1 tablet by mouth daily.      [name]  omeprazole (PRILOSEC) 40 MG capsule Take 40 mg by mouth 2 (two) times daily.       . predniSONE (DELTASONE) 10 MG tablet 4 tabs x 2 days, 3 x 2 days, 2 x 2 days and 1 x 2 days  20 tablet  0  . rosuvastatin (CRESTOR) 5 MG tablet Take 5 mg by mouth 3 (three) times a week.      [name] Spacer/Aero-Holding Chambers (AEROCHAMBER PLUS) inhaler Use as instructed  1 each  0  . tiotropium (SPIRIVA) 18 MCG inhalation capsule Place 1 capsule (18 mcg total) into inhaler and inhale daily.  90 capsule  3   No current facility-administered medications for this visit.    Past Medical History  Diagnosis Date  . Other diseases of vocal cords   . Osteoporosis, unspecified   . Other and unspecified hyperlipidemia   . Esophageal reflux   . Chronic airway  obstruction, not elsewhere classified   . Regional enteritis of unspecified site   . 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 Type II or unspecified type diabetes mellitus without mention of complication, not stated as uncontrolled   . Crohn's disease     Ileostomy  . Pelvic adhesions   . Anxiety   . Depression   . Hypertension   . Shortness of breath   . Pneumonia   . Asthma   . Emphysema   . Hematuria   . Rheumatoid arthritis(714.0) 03/2012    [place]  . Severe cervical dysplasia age 70    cone biopsy with neg paps since    Past Surgical History  Procedure Laterality Date  . Ileostomy    . Vaginal hysterectomy  1993    LAVH-LSO  . Ear cyst excision    . Cervical cone biopsy  1978  . Colon surgery  2007    Resection-Ileostomy  . Esophagus stretch    . Oophorectomy      LSO  . Tracheostomy  2010  . Hammer toe surgery  1980's    Bilateral Feet  . Balloon dilation  02/10/2012    Procedure: BALLOON DILATION;  Surgeon: Charolett Bumpers, MD;  Location: [facility];  Service: Endoscopy;  Laterality: N/A;  . Colposcopy    . Flexible sigmoidoscopy  03/26/2012    Procedure: FLEXIBLE SIGMOIDOSCOPY;  Surgeon: Charolett Bumpers, MD;  Location: [facility];  Service: Endoscopy;  Laterality: N/A;  . Cardiac catheterization  07/08/2007    normal  . Nm myocar perf wall motion  09/21/2006    no ischemia    Family History  Problem Relation Age of Onset  . Osteoporosis Mother   . Kidney disease Mother   . Hypertension Sister   . Stroke Sister   . Diabetes Sister     History   Social History  . Marital Status: Divorced    Spouse Name: N/A    Number of Children: N/A  . Years of Education: N/A   Occupational History  . Not on file.   Social History Main Topics  . Smoking status: Former Smoker -- 1.00 packs/day for 32 years    Quit date: 05/27/2003  . Smokeless tobacco: Never Used  . Alcohol Use: 0.0 oz/week    0 Glasses of wine per week     Comment:  occasional  . Drug Use: No  . Sexual Activity: No   Other Topics Concern  . Not on file   Social History Narrative  . No narrative on file    Review  of systems: She has chronic dyspnea at rest and with minimal exertion The patient specifically denies any chest pain at rest or with exertion,  orthopnea, paroxysmal nocturnal dyspnea, syncope, palpitations, focal neurological deficits, intermittent claudication, lower extremity edema, unexplained weight gain, cough, hemoptysis or wheezing.  The patient also denies abdominal pain, nausea, vomiting, dysphagia, diarrhea, constipation, polyuria, polydipsia, dysuria, hematuria, frequency, urgency, abnormal bleeding or bruising, fever, chills, unexpected weight changes, mood swings, change in skin or hair texture, change in voice quality, auditory or visual problems, allergic reactions or rashes, new musculoskeletal complaints other than usual "aches and pains".   PHYSICAL EXAM BP 110/70  Pulse 88  Resp 16  Ht 5\' 1"  (1.549 m)  Wt 117 lb 9.6 oz (53.343 kg)  BMI 22.23 kg/m2  General: Alert, oriented x3, no distress; smiling, wearing oxygen Head: no evidence of trauma, PERRL, EOMI, no exophtalmos or lid lag, no myxedema, no xanthelasma; normal ears, nose and oropharynx Neck: normal jugular venous pulsations and no hepatojugular reflux; brisk carotid pulses without delay and no carotid bruits Chest: Breath sounds are diminished throughout but lungs are otherwise clear to auscultation, no signs of consolidation by percussion or palpation, normal fremitus, symmetrical and full respiratory excursions Cardiovascular: normal position and quality of the apical impulse, regular rhythm, normal first and second heart sounds, no murmurs, rubs or gallops Abdomen: no tenderness or distention, no masses by palpation, no abnormal pulsatility or arterial bruits, normal bowel sounds, no hepatosplenomegaly; ostomy bag in right lower 100 Extremities: no clubbing,  cyanosis or edema; 2+ radial, ulnar and brachial pulses bilaterally; 2+ right femoral, posterior tibial and dorsalis pedis pulses; 2+ left femoral, posterior tibial and dorsalis pedis pulses; no subclavian or femoral bruits Neurological: grossly nonfocal   EKG: Sinus rhythm with borderline criteria for right atrial abnormality, otherwise normal  Lipid Panel     Component Value Date/Time   CHOL  Value: 163        ATP III CLASSIFICATION:  <200     mg/dL   Desirable  161-096  mg/dL   Borderline High  >=045    mg/dL   High        09/01/8117 1105   TRIG 62 10/10/2009 1105   HDL 74 10/10/2009 1105   CHOLHDL 2.2 10/10/2009 1105   VLDL 12 10/10/2009 1105   LDLCALC  Value: 77        Total Cholesterol/HDL:CHD Risk Coronary Heart Disease Risk Table                     Men   Women  1/2 Average Risk   3.4   3.3  Average Risk       5.0   4.4  2 X Average Risk   9.6   7.1  3 X Average Risk  23.4   11.0        Use the calculated Patient Ratio above and the CHD Risk Table to determine the patient's CHD Risk.        ATP III CLASSIFICATION (LDL):  <100     mg/dL   Optimal  147-829  mg/dL   Near or Above                    Optimal  130-159  mg/dL   Borderline  562-130  mg/dL   High  >865     mg/dL   Very High 7/84/6962 9528    BMET    Component Value Date/Time   NA  140 12/04/2011 0545   K 4.2 12/04/2011 0545   CL 103 12/04/2011 0545   CO2 27 12/04/2011 0545   GLUCOSE 140* 12/04/2011 0545   BUN 22 12/04/2011 0545   CREATININE 1.04 08/27/2012 1054   CREATININE 0.86 12/04/2011 0545   CALCIUM 9.7 08/27/2012 1054   CALCIUM 9.0 07/15/2011 0920   GFRNONAA 71* 12/04/2011 0545   GFRAA 83* 12/04/2011 0545     ASSESSMENT AND PLAN MULTIFOCAL ATRIAL TACHYCARDIA Recently this has been a lot less of a problem than  in the past. This may actually reflect better control of her pulmonary problems since the atrial tachycardia is almost certainly related to chronic lung disease. It is likely that the episodes of atrial tachycardia with  increasing frequency if she has episodes of respiratory insufficiency. Anticoagulations not indicated. Also she does not have any features to suggest active right heart failure.  Orders Placed This Encounter  Procedures  . EKG 12-Lead   No orders of the defined types were placed in this encounter.    Junious Silk, MD, [facility] [phone] office [phone] pager

## 2013-03-01 ENCOUNTER — Telehealth: Payer: Self-pay | Admitting: Critical Care Medicine

## 2013-03-01 MED ORDER — MOMETASONE FUROATE 50 MCG/ACT NA SUSP: 2.0000 | Freq: Every day | NASAL | Status: DC

## 2013-03-01 NOTE — Telephone Encounter (Signed)
lmomtcb x1 for pt 

## 2013-03-01 NOTE — Telephone Encounter (Signed)
Pt called back. She is aware will send in RX for her nasonex. Nothing further needed

## 2013-03-09 ENCOUNTER — Encounter: Payer: Self-pay | Admitting: Obstetrics and Gynecology

## 2013-03-09 ENCOUNTER — Encounter: Payer: Self-pay | Admitting: Gynecology

## 2013-03-09 ENCOUNTER — Ambulatory Visit (INDEPENDENT_AMBULATORY_CARE_PROVIDER_SITE_OTHER): Payer: Medicare Other | Admitting: Gynecology

## 2013-03-09 VITALS — BP 120/76 | Ht 61.0 in | Wt 119.0 lb

## 2013-03-09 DIAGNOSIS — N952 Postmenopausal atrophic vaginitis: Secondary | ICD-10-CM

## 2013-03-09 DIAGNOSIS — M810 Age-related osteoporosis without current pathological fracture: Secondary | ICD-10-CM

## 2013-03-09 NOTE — Progress Notes (Signed)
[name] Nov 05, 1950 [id number]        63 y.o.  G3P0030 for followup exam.  Former patient Dr. Eda [name]. Several issues noted below.  Past medical history,surgical history, medications, allergies, family history and social history were all reviewed and documented in the EPIC chart.  ROS:  Performed and pertinent positives and negatives are included in the history, assessment and plan .  Exam: [name] assistant Filed Vitals:   03/09/13 0938  BP: 120/76  Height: 5\' 1"  (1.549 m)  Weight: 119 lb (53.978 kg)   General appearance  Normal Skin grossly normal Head/Neck normal with no cervical or supraclavicular adenopathy thyroid normal Lungs  clear Cardiac RR, without RMG Abdominal  soft, nontender, without masses, organomegaly or hernia Breasts  examined lying and sitting without masses, retractions, discharge or axillary adenopathy. Pelvic  Ext/BUS/vagina  Normal with atrophic changes  Adnexa  Without masses or tenderness    Anus and perineum  normal with superficial anal exam normal.      Assessment/Plan:  62 y.o. G3P0030 female for followup exam.   1. Postmenopausal/atrophic genital changes status post LAVH LSO for leiomyoma. Doing well without significant symptoms of hot flashes night sweats vaginal dryness. Will continue to monitor. 2. Osteoporosis. DEXA 04/2012 T score -2.7 had been on IV Boniva and subsequently IV Reclast now for approximately 8 years. Comparison to prior study short interval 2012 2013 loss at AP spine but improved from baseline 2006. Left and right hips both improved from 2012. Had been on prednisone but this was discontinued. Recommended continuing current regimen with annual Reclast. Long-term risks to include atypical fracture is reviewed. Do not feel more aggressive treatment with Forteo needed at this time. 3. Pap smear 2012. No Pap smear done today. History of high-grade cervical dysplasia status post cone biopsy 1978. Subsequent LAVH.  Options to stop  screening altogether or less frequent screening intervals reviewed. Will readdress on an annual basis. 4. Mammography 12/2012. Continue with annual mammography. SBE monthly reviewed. 5. Colonoscopy reported 2013. 6. Health maintenance. No lab work done as this is all done through her primary physician's office who follows her for her multiple medical issues. Followup one year, sooner as needed.  Note: This document was prepared with digital dictation and possible smart phrase technology. Any transcriptional errors that result from this process are unintentional.   Dara Lords MD, 10:24 AM 03/09/2013

## 2013-03-09 NOTE — Patient Instructions (Signed)
follow up in one year for annual exam 

## 2013-03-10 ENCOUNTER — Telehealth: Payer: Self-pay | Admitting: Critical Care Medicine

## 2013-03-10 NOTE — Telephone Encounter (Signed)
lmomtcb x1 for pt 

## 2013-03-11 MED ORDER — FLUTICASONE PROPIONATE 50 MCG/ACT NA SUSP: 2.0000 | Freq: Every day | NASAL | Status: DC

## 2013-03-11 NOTE — Telephone Encounter (Signed)
Pt returned call and can be reached @ the same #. [name] °

## 2013-03-11 NOTE — Telephone Encounter (Signed)
Called and spoke with pt and she stated that the nasonex worked very well for her.  The only problem is that it is very expensive even with her insurance.  She stated that she called to get pt assistance with this medication but they only help people with no insurance.  Pt would like a generic that is just as good as the nasonex.  PW please advise. Thanks  Allergies  Allergen Reactions  . Esomeprazole Magnesium Other (See Comments)    NEXIUM - reaction > aggravated pt's Crohn's disease  . Other     Beans, Dander/Dust, Peas, Mushrooms  . Peanut-Containing Drug Products Other (See Comments)    unknown  . Shellfish Allergy Other (See Comments)    unknown  . Surgical Lubricant

## 2013-03-11 NOTE — Telephone Encounter (Signed)
Returning call can be reached at [phone].[name]

## 2013-03-11 NOTE — Telephone Encounter (Signed)
Called and lmomtcb for the pt to make her aware of PW recs an to let her know that the new med has been sent to the pharmacy.

## 2013-03-11 NOTE — Telephone Encounter (Signed)
I spoke with pt and aware of recs. Nothing further needed 

## 2013-03-11 NOTE — Telephone Encounter (Signed)
Use flonase two puff ea nostril daily

## 2013-03-14 ENCOUNTER — Encounter (HOSPITAL_COMMUNITY): Payer: Self-pay | Admitting: Emergency Medicine

## 2013-03-14 ENCOUNTER — Emergency Department (HOSPITAL_COMMUNITY): Payer: Medicare Other

## 2013-03-14 ENCOUNTER — Inpatient Hospital Stay (HOSPITAL_COMMUNITY)
Admission: EM | Admit: 2013-03-14 | Discharge: 2013-03-22 | DRG: 336 | Disposition: A | Discharge status: Home Health | Payer: Medicare Other | Attending: Internal Medicine | Admitting: Internal Medicine

## 2013-03-14 DIAGNOSIS — E785 Hyperlipidemia, unspecified: Secondary | ICD-10-CM

## 2013-03-14 DIAGNOSIS — K5000 Crohn's disease of small intestine without complications: Secondary | ICD-10-CM | POA: Diagnosis present

## 2013-03-14 DIAGNOSIS — IMO0002 Reserved for concepts with insufficient information to code with codable children: Secondary | ICD-10-CM | POA: Diagnosis present

## 2013-03-14 DIAGNOSIS — R5082 Postprocedural fever: Secondary | ICD-10-CM | POA: Diagnosis not present

## 2013-03-14 DIAGNOSIS — K5090 Crohn's disease, unspecified, without complications: Secondary | ICD-10-CM | POA: Diagnosis present

## 2013-03-14 DIAGNOSIS — Z87891 Personal history of nicotine dependence: Secondary | ICD-10-CM | POA: Diagnosis present

## 2013-03-14 DIAGNOSIS — I498 Other specified cardiac arrhythmias: Secondary | ICD-10-CM

## 2013-03-14 DIAGNOSIS — N879 Dysplasia of cervix uteri, unspecified: Secondary | ICD-10-CM

## 2013-03-14 DIAGNOSIS — R7881 Bacteremia: Secondary | ICD-10-CM | POA: Diagnosis present

## 2013-03-14 DIAGNOSIS — K56609 Unspecified intestinal obstruction, unspecified as to partial versus complete obstruction: Secondary | ICD-10-CM

## 2013-03-14 DIAGNOSIS — R000 Tachycardia, unspecified: Secondary | ICD-10-CM

## 2013-03-14 DIAGNOSIS — F419 Anxiety disorder, unspecified: Secondary | ICD-10-CM

## 2013-03-14 DIAGNOSIS — E871 Hypo-osmolality and hyponatremia: Secondary | ICD-10-CM | POA: Diagnosis not present

## 2013-03-14 DIAGNOSIS — R1114 Bilious vomiting: Secondary | ICD-10-CM | POA: Diagnosis present

## 2013-03-14 DIAGNOSIS — J9610 Chronic respiratory failure, unspecified whether with hypoxia or hypercapnia: Secondary | ICD-10-CM | POA: Diagnosis present

## 2013-03-14 DIAGNOSIS — A4902 Methicillin resistant Staphylococcus aureus infection, unspecified site: Secondary | ICD-10-CM | POA: Diagnosis present

## 2013-03-14 DIAGNOSIS — M069 Rheumatoid arthritis, unspecified: Secondary | ICD-10-CM | POA: Diagnosis present

## 2013-03-14 DIAGNOSIS — D62 Acute posthemorrhagic anemia: Secondary | ICD-10-CM | POA: Diagnosis not present

## 2013-03-14 DIAGNOSIS — R319 Hematuria, unspecified: Secondary | ICD-10-CM

## 2013-03-14 DIAGNOSIS — N736 Female pelvic peritoneal adhesions (postinfective): Secondary | ICD-10-CM | POA: Diagnosis present

## 2013-03-14 DIAGNOSIS — K219 Gastro-esophageal reflux disease without esophagitis: Secondary | ICD-10-CM | POA: Diagnosis present

## 2013-03-14 DIAGNOSIS — Y833 Surgical operation with formation of external stoma as the cause of abnormal reaction of the patient, or of later complication, without mention of misadventure at the time of the procedure: Secondary | ICD-10-CM | POA: Diagnosis present

## 2013-03-14 DIAGNOSIS — E119 Type 2 diabetes mellitus without complications: Secondary | ICD-10-CM | POA: Diagnosis present

## 2013-03-14 DIAGNOSIS — I10 Essential (primary) hypertension: Secondary | ICD-10-CM | POA: Diagnosis present

## 2013-03-14 DIAGNOSIS — J383 Other diseases of vocal cords: Secondary | ICD-10-CM

## 2013-03-14 DIAGNOSIS — K565 Intestinal adhesions [bands], unspecified as to partial versus complete obstruction: Principal | ICD-10-CM | POA: Diagnosis present

## 2013-03-14 DIAGNOSIS — F411 Generalized anxiety disorder: Secondary | ICD-10-CM | POA: Diagnosis present

## 2013-03-14 DIAGNOSIS — R002 Palpitations: Secondary | ICD-10-CM

## 2013-03-14 DIAGNOSIS — E870 Hyperosmolality and hypernatremia: Secondary | ICD-10-CM | POA: Diagnosis present

## 2013-03-14 DIAGNOSIS — K5010 Crohn's disease of large intestine without complications: Secondary | ICD-10-CM

## 2013-03-14 DIAGNOSIS — R7989 Other specified abnormal findings of blood chemistry: Secondary | ICD-10-CM

## 2013-03-14 DIAGNOSIS — M810 Age-related osteoporosis without current pathological fracture: Secondary | ICD-10-CM | POA: Diagnosis present

## 2013-03-14 DIAGNOSIS — Z9049 Acquired absence of other specified parts of digestive tract: Secondary | ICD-10-CM

## 2013-03-14 DIAGNOSIS — F329 Major depressive disorder, single episode, unspecified: Secondary | ICD-10-CM

## 2013-03-14 DIAGNOSIS — J439 Emphysema, unspecified: Secondary | ICD-10-CM

## 2013-03-14 DIAGNOSIS — R0902 Hypoxemia: Secondary | ICD-10-CM

## 2013-03-14 DIAGNOSIS — J441 Chronic obstructive pulmonary disease with (acute) exacerbation: Secondary | ICD-10-CM | POA: Diagnosis present

## 2013-03-14 DIAGNOSIS — K432 Incisional hernia without obstruction or gangrene: Secondary | ICD-10-CM | POA: Diagnosis present

## 2013-03-14 LAB — CBC WITH DIFFERENTIAL/PLATELET
Basophils Absolute: 0 10*3/uL (ref 0.0–0.1)
Basophils Relative: 0 % (ref 0–1)
Eosinophils Absolute: 0 10*3/uL (ref 0.0–0.7)
Eosinophils Relative: 0 % (ref 0–5)
HCT: 35 % — ABNORMAL LOW (ref 36.0–46.0)
Hemoglobin: 11.6 g/dL — ABNORMAL LOW (ref 12.0–15.0)
MCH: 28 pg (ref 26.0–34.0)
MCHC: 33.1 g/dL (ref 30.0–36.0)
MCV: 84.3 fL (ref 78.0–100.0)
Monocytes Absolute: 0.4 10*3/uL (ref 0.1–1.0)
RDW: 12.9 % (ref 11.5–15.5)

## 2013-03-14 LAB — URINE MICROSCOPIC-ADD ON

## 2013-03-14 LAB — COMPREHENSIVE METABOLIC PANEL
ALT: 13 U/L (ref 0–35)
AST: 25 U/L (ref 0–37)
Albumin: 3.8 g/dL (ref 3.5–5.2)
Calcium: 9.5 mg/dL (ref 8.4–10.5)
Creatinine, Ser: 1 mg/dL (ref 0.50–1.10)
GFR calc non Af Amer: 59 mL/min — ABNORMAL LOW (ref >90)
Total Protein: 7.6 g/dL (ref 6.0–8.3)

## 2013-03-14 LAB — CBC
HCT: 30.3 % — ABNORMAL LOW (ref 36.0–46.0)
Hemoglobin: 10 g/dL — ABNORMAL LOW (ref 12.0–15.0)
MCHC: 33 g/dL (ref 30.0–36.0)
MCV: 85.1 fL (ref 78.0–100.0)
RBC: 3.56 MIL/uL — ABNORMAL LOW (ref 3.87–5.11)
WBC: 8.6 10*3/uL (ref 4.0–10.5)

## 2013-03-14 LAB — URINALYSIS, ROUTINE W REFLEX MICROSCOPIC
Glucose, UA: NEGATIVE mg/dL
Hgb urine dipstick: NEGATIVE
Ketones, ur: 40 mg/dL — AB
Protein, ur: 30 mg/dL — AB
pH: 5.5 (ref 5.0–8.0)

## 2013-03-14 LAB — GLUCOSE, CAPILLARY
Glucose-Capillary: 58 mg/dL — ABNORMAL LOW (ref 70–99)
Glucose-Capillary: 279 mg/dL — ABNORMAL HIGH (ref 70–99)

## 2013-03-14 LAB — CREATININE, SERUM
GFR calc Af Amer: 70 mL/min — ABNORMAL LOW (ref >90)
GFR calc non Af Amer: 60 mL/min — ABNORMAL LOW (ref >90)

## 2013-03-14 LAB — LACTIC ACID, PLASMA: Lactic Acid, Venous: 1 mmol/L (ref 0.5–2.2)

## 2013-03-14 MED ORDER — TIOTROPIUM BROMIDE MONOHYDRATE 18 MCG IN CAPS
18.0000 ug | ORAL_CAPSULE | Freq: Every day | RESPIRATORY_TRACT | Status: DC
  Administered 2013-03-15 – 2013-03-22 (×6): 18 ug via RESPIRATORY_TRACT
  Filled 2013-03-14 (×2): qty 5

## 2013-03-14 MED ORDER — ONDANSETRON HCL 4 MG/2ML IJ SOLN
4.0000 mg | Freq: Once | INTRAMUSCULAR | Status: AC
  Administered 2013-03-14: 4 mg via INTRAVENOUS
  Filled 2013-03-14: qty 2

## 2013-03-14 MED ORDER — MORPHINE SULFATE 4 MG/ML IJ SOLN
4.0000 mg | Freq: Once | INTRAMUSCULAR | Status: AC
  Administered 2013-03-14: 4 mg via INTRAVENOUS
  Filled 2013-03-14: qty 1

## 2013-03-14 MED ORDER — CITALOPRAM HYDROBROMIDE 20 MG PO TABS
20.0000 mg | ORAL_TABLET | Freq: Every day | ORAL | Status: DC
  Administered 2013-03-14 – 2013-03-15 (×2): 20 mg via ORAL
  Filled 2013-03-14 (×4): qty 1

## 2013-03-14 MED ORDER — FAMOTIDINE IN NACL 20-0.9 MG/50ML-% IV SOLN
20.0000 mg | Freq: Two times a day (BID) | INTRAVENOUS | Status: DC
  Administered 2013-03-14 – 2013-03-16 (×4): 20 mg via INTRAVENOUS
  Filled 2013-03-14 (×5): qty 50

## 2013-03-14 MED ORDER — IOHEXOL 300 MG/ML  SOLN
100.0000 mL | Freq: Once | INTRAMUSCULAR | Status: AC | PRN
  Administered 2013-03-14: 100 mL via INTRAVENOUS

## 2013-03-14 MED ORDER — METOPROLOL TARTRATE 12.5 MG HALF TABLET
12.5000 mg | ORAL_TABLET | Freq: Two times a day (BID) | ORAL | Status: DC
  Administered 2013-03-15 (×2): 12.5 mg via ORAL
  Filled 2013-03-14 (×5): qty 1

## 2013-03-14 MED ORDER — HYDROMORPHONE HCL PF 1 MG/ML IJ SOLN
1.0000 mg | INTRAMUSCULAR | Status: DC | PRN
  Administered 2013-03-14: 1 mg via INTRAVENOUS
  Filled 2013-03-14: qty 1

## 2013-03-14 MED ORDER — ONDANSETRON HCL 4 MG/2ML IJ SOLN
4.0000 mg | Freq: Three times a day (TID) | INTRAMUSCULAR | Status: AC | PRN
  Administered 2013-03-14 (×2): 4 mg via INTRAVENOUS
  Filled 2013-03-14 (×2): qty 2

## 2013-03-14 MED ORDER — PROMETHAZINE HCL 25 MG/ML IJ SOLN
12.5000 mg | Freq: Four times a day (QID) | INTRAMUSCULAR | Status: DC | PRN
  Administered 2013-03-14 – 2013-03-15 (×3): 12.5 mg via INTRAVENOUS
  Filled 2013-03-14 (×3): qty 1

## 2013-03-14 MED ORDER — BUDESONIDE 0.25 MG/2ML IN SUSP
0.2500 mg | Freq: Two times a day (BID) | RESPIRATORY_TRACT | Status: DC
  Administered 2013-03-15 – 2013-03-22 (×14): 0.25 mg via RESPIRATORY_TRACT
  Filled 2013-03-14 (×18): qty 2

## 2013-03-14 MED ORDER — MORPHINE SULFATE 2 MG/ML IJ SOLN
2.0000 mg | INTRAMUSCULAR | Status: DC | PRN
  Administered 2013-03-14 – 2013-03-15 (×4): 2 mg via INTRAVENOUS
  Filled 2013-03-14 (×4): qty 1

## 2013-03-14 MED ORDER — HYDROXYCHLOROQUINE SULFATE 200 MG PO TABS
200.0000 mg | ORAL_TABLET | Freq: Two times a day (BID) | ORAL | Status: DC
  Administered 2013-03-15 (×2): 200 mg via ORAL
  Filled 2013-03-14 (×5): qty 1

## 2013-03-14 MED ORDER — LORAZEPAM 0.5 MG PO TABS: 0.5000 mg | ORAL_TABLET | Freq: Three times a day (TID) | ORAL | Status: DC | PRN

## 2013-03-14 MED ORDER — IOHEXOL 300 MG/ML  SOLN
25.0000 mL | INTRAMUSCULAR | Status: AC
  Administered 2013-03-14: 25 mL via ORAL

## 2013-03-14 MED ORDER — SODIUM CHLORIDE 0.9 % IV BOLUS (SEPSIS)
1000.0000 mL | Freq: Once | INTRAVENOUS | Status: AC
  Administered 2013-03-14: 1000 mL via INTRAVENOUS

## 2013-03-14 MED ORDER — DEXTROSE 50 % IV SOLN
INTRAVENOUS | Status: AC
  Administered 2013-03-14: 18:00:00
  Filled 2013-03-14: qty 50

## 2013-03-14 MED ORDER — ARFORMOTEROL TARTRATE 15 MCG/2ML IN NEBU
15.0000 ug | INHALATION_SOLUTION | Freq: Two times a day (BID) | RESPIRATORY_TRACT | Status: DC
  Administered 2013-03-14 – 2013-03-22 (×12): 15 ug via RESPIRATORY_TRACT
  Filled 2013-03-14 (×19): qty 2

## 2013-03-14 MED ORDER — SODIUM CHLORIDE 0.9 % IV SOLN
INTRAVENOUS | Status: DC
  Administered 2013-03-14: 17:00:00 via INTRAVENOUS

## 2013-03-14 MED ORDER — DEXTROSE-NACL 5-0.9 % IV SOLN
INTRAVENOUS | Status: DC
  Administered 2013-03-14: 19:00:00 via INTRAVENOUS

## 2013-03-14 MED ORDER — INSULIN ASPART 100 UNIT/ML ~~LOC~~ SOLN
0.0000 [IU] | Freq: Three times a day (TID) | SUBCUTANEOUS | Status: DC
  Administered 2013-03-15 – 2013-03-18 (×5): 1 [IU] via SUBCUTANEOUS

## 2013-03-14 MED ORDER — ENOXAPARIN SODIUM 40 MG/0.4ML ~~LOC~~ SOLN
40.0000 mg | SUBCUTANEOUS | Status: DC
  Administered 2013-03-14: 40 mg via SUBCUTANEOUS
  Filled 2013-03-14 (×3): qty 0.4

## 2013-03-14 MED ORDER — DEXTROSE 50 % IV SOLN: 25.0000 mL | Freq: Once | INTRAVENOUS | Status: AC | PRN

## 2013-03-14 MED ORDER — DILTIAZEM HCL ER COATED BEADS 180 MG PO CP24
180.0000 mg | ORAL_CAPSULE | Freq: Two times a day (BID) | ORAL | Status: DC
  Administered 2013-03-15 (×2): 180 mg via ORAL
  Filled 2013-03-14 (×5): qty 1

## 2013-03-14 MED ORDER — FLUTICASONE PROPIONATE 50 MCG/ACT NA SUSP
2.0000 | Freq: Every day | NASAL | Status: DC
  Administered 2013-03-14 – 2013-03-22 (×9): 2 via NASAL
  Filled 2013-03-14 (×2): qty 16

## 2013-03-14 NOTE — ED Notes (Signed)
To ED from home via [facility] with c/o severe abd pain started at 1:30am. Has hx Crohn's ds, with colostomy, pt moaning and writhing in pain.

## 2013-03-14 NOTE — Consult Note (Signed)
[name] 21-Oct-1950  [id number].   Primary GI MD: Dr. Danise Edge Requesting MD: Dr. Gwyneth Sprout Chief Complaint/Reason for Consult: SBO HPI: This is a 62 yo black female who has a significant h/o COPD and pulmonary disease along with crohn's disease for which she had a total abdominal colectomy and ileostomy for in 2007 at [facility].  She was apparently in the hospital for 5 months secondary to pulmonary and other complications.  She does not take medication for her crohn's disease as she has not had any issues since her surgery in 2007.  This morning she was awakened from sleep at 0130 am due to lower abdominal pain, nausea, and vomiting.  EMS was called and she was brought here to [facility] for further workup.  She had a CT scan that revealed a SBO.  She has several hernias, but none are obstructive in nature.  It appears she has a transition point in the pelvis from either recurrent crohn's disease or adhesive disease.  She does have some SB wall thickening in a loop near her obstruction.  We have been asked to see the patient for further recommendations.  ROS: Please see HPI, otherwise all other systems are negative with the exception of severe COPD for which she is on 2.5-3L of O2 at home for and pain in her knee and right shoulder secondary to arthritis.  Family History  Problem Relation Age of Onset  . Osteoporosis Mother   . Kidney disease Mother   . Hypertension Sister   . Stroke Sister   . Diabetes Sister     Past Medical History  Diagnosis Date  . Other diseases of vocal cords   . Osteoporosis, unspecified   . Other and unspecified hyperlipidemia   . Esophageal reflux   . Chronic airway obstruction, not elsewhere classified   . Regional enteritis of unspecified site   . 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 Type II or unspecified type diabetes mellitus without mention of complication, not stated as uncontrolled   . Crohn's disease     Ileostomy   . Pelvic adhesions   . Anxiety   . Depression   . Hypertension   . Shortness of breath   . Pneumonia   . Asthma   . Emphysema   . Hematuria   . Rheumatoid arthritis(714.0) 03/2012    Beeckman  . Severe cervical dysplasia age 46    cone biopsy with neg paps since    Past Surgical History  Procedure Laterality Date  . Ileostomy    . Vaginal hysterectomy  1993    LAVH-LSO  . Ear cyst excision    . Cervical cone biopsy  1978  . Colon surgery  2007    subtotal colectomy with ileostomy  . Esophagus stretch    . Oophorectomy      LSO  . Tracheostomy  2010  . Hammer toe surgery  1980's    Bilateral Feet  . Balloon dilation  02/10/2012    Procedure: BALLOON DILATION;  Surgeon: Charolett Bumpers, MD;  Location: [facility];  Service: Endoscopy;  Laterality: N/A;  . Colposcopy    . Flexible sigmoidoscopy  03/26/2012    Procedure: FLEXIBLE SIGMOIDOSCOPY;  Surgeon: Charolett Bumpers, MD;  Location: [facility];  Service: Endoscopy;  Laterality: N/A;  . Cardiac catheterization  07/08/2007    normal  . Nm myocar perf wall motion  09/21/2006    no ischemia    Social History:  reports that she  quit smoking about 9 years ago. She has never used smokeless tobacco. She reports that she drinks alcohol. She reports that she does not use illicit drugs.  Allergies:  Allergies  Allergen Reactions  . Esomeprazole Magnesium Other (See Comments)    NEXIUM - reaction > aggravated pt's Crohn's disease  . Other     Beans, Dander/Dust, Peas, Mushrooms  . Peanut-Containing Drug Products Other (See Comments)    unknown  . Shellfish Allergy Other (See Comments)    unknown  . Surgical Lubricant Other (See Comments)    Burns skin      (Not in a hospital admission)  Blood pressure 107/65, pulse 107, temperature 98.2 F (36.8 C), temperature source Oral, resp. rate 12, weight 119 lb (53.978 kg), SpO2 100.00%. Physical Exam: General: pleasant, WD, WN black female who is laying in bed in  NAD HEENT: head is normocephalic, atraumatic.  Sclera are noninjected.  PERRL.  Ears and nose without any masses or lesions.  Mouth is pink and moist Heart: regular rhythm, slightly tachycardic.  Normal s1,s2. No obvious murmurs, gallops, or rubs noted.  Palpable radial and pedal pulses bilaterally Lungs: bilateral expiratory wheezing with O2 in place at 2L.  Respiratory effort nonlabored Abd: soft, tender periumbilical and lower central portion of her abdomen, ND, +BS, no masses, organomegaly.  Ileostomy in place with minimal liquid output.  Incisional hernia that is soft and reducible.  MS: all 4 extremities are symmetrical with no cyanosis, clubbing, or edema. Skin: warm and dry with no masses, lesions, or rashes Psych: A&Ox3 with an appropriate affect.    Results for orders placed during the hospital encounter of 03/14/13 (from the past 48 hour(s))  CBC WITH DIFFERENTIAL     Status: Abnormal   Collection Time    03/14/13  9:40 AM      Result Value Range   WBC 9.3  4.0 - 10.5 K/uL   RBC 4.15  3.87 - 5.11 MIL/uL   Hemoglobin 11.6 (*) 12.0 - 15.0 g/dL   HCT 40.9 (*) 81.1 - 91.4 %   MCV 84.3  78.0 - 100.0 fL   MCH 28.0  26.0 - 34.0 pg   MCHC 33.1  30.0 - 36.0 g/dL   RDW 78.2  95.6 - 21.3 %   Platelets 223  150 - 400 K/uL   Neutrophils Relative % 84 (*) 43 - 77 %   Neutro Abs 7.9 (*) 1.7 - 7.7 K/uL   Lymphocytes Relative 11 (*) 12 - 46 %   Lymphs Abs 1.0  0.7 - 4.0 K/uL   Monocytes Relative 4  3 - 12 %   Monocytes Absolute 0.4  0.1 - 1.0 K/uL   Eosinophils Relative 0  0 - 5 %   Eosinophils Absolute 0.0  0.0 - 0.7 K/uL   Basophils Relative 0  0 - 1 %   Basophils Absolute 0.0  0.0 - 0.1 K/uL  COMPREHENSIVE METABOLIC PANEL     Status: Abnormal   Collection Time    03/14/13  9:40 AM      Result Value Range   Sodium 141  135 - 145 mEq/L   Potassium 4.4  3.5 - 5.1 mEq/L   Chloride 104  96 - 112 mEq/L   CO2 24  19 - 32 mEq/L   Glucose, Bld 90  70 - 99 mg/dL   BUN 14  6 - 23 mg/dL    Creatinine, Ser 0.86  0.50 - 1.10 mg/dL   Calcium 9.5  8.4 - 10.5 mg/dL   Total Protein 7.6  6.0 - 8.3 g/dL   Albumin 3.8  3.5 - 5.2 g/dL   AST 25  0 - 37 U/L   ALT 13  0 - 35 U/L   Alkaline Phosphatase 76  39 - 117 U/L   Total Bilirubin 0.4  0.3 - 1.2 mg/dL   GFR calc non Af Amer 59 (*) >90 mL/min   GFR calc Af Amer 68 (*) >90 mL/min   Comment: (NOTE)     The eGFR has been calculated using the CKD EPI equation.     This calculation has not been validated in all clinical situations.     eGFR's persistently <90 mL/min signify possible Chronic Kidney     Disease.  LIPASE, BLOOD     Status: None   Collection Time    03/14/13  9:40 AM      Result Value Range   Lipase 23  11 - 59 U/L  LACTIC ACID, PLASMA     Status: None   Collection Time    03/14/13 10:55 AM      Result Value Range   Lactic Acid, Venous 1.0  0.5 - 2.2 mmol/L  URINALYSIS, ROUTINE W REFLEX MICROSCOPIC     Status: Abnormal   Collection Time    03/14/13 11:28 AM      Result Value Range   Color, Urine YELLOW  YELLOW   APPearance HAZY (*) CLEAR   Specific Gravity, Urine 1.019  1.005 - 1.030   pH 5.5  5.0 - 8.0   Glucose, UA NEGATIVE  NEGATIVE mg/dL   Hgb urine dipstick NEGATIVE  NEGATIVE   Bilirubin Urine NEGATIVE  NEGATIVE   Ketones, ur 40 (*) NEGATIVE mg/dL   Protein, ur 30 (*) NEGATIVE mg/dL   Urobilinogen, UA 0.2  0.0 - 1.0 mg/dL   Nitrite NEGATIVE  NEGATIVE   Leukocytes, UA MODERATE (*) NEGATIVE  URINE MICROSCOPIC-ADD ON     Status: Abnormal   Collection Time    03/14/13 11:28 AM      Result Value Range   Squamous Epithelial / LPF FEW (*) RARE   WBC, UA 7-10  <3 WBC/hpf   Bacteria, UA RARE  RARE   Casts HYALINE CASTS (*) NEGATIVE   Urine-Other MUCOUS PRESENT     Ct Abdomen Pelvis W Contrast  03/14/2013   CLINICAL DATA:  Diffuse abdominal pain. Nausea and vomiting. History of Crohn disease.  EXAM: CT ABDOMEN AND PELVIS WITH CONTRAST  TECHNIQUE: Multidetector CT imaging of the abdomen and pelvis was  performed using the standard protocol following bolus administration of intravenous contrast.  CONTRAST:  OMNIPAQUE IOHEXOL 300 MG/ML  SOLN  COMPARISON:  [facility] CT ABDOMEN AND PELVIS WITH CONTRAST from 03/02/2012.  FINDINGS: No focal abnormality is seen in the liver or spleen. The stomach, duodenum, pancreas, gallbladder, and adrenal glands are unremarkable. No evidence for hydronephrosis or renal mass in either kidney. Tiny low-density cortical lesions in the kidneys bilaterally are too small to characterize but are stable and likely represent tiny cysts.  No abdominal aortic aneurysm. There is no free fluid or lymphadenopathy in the abdomen.  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  The patient has dilated small bowel loops tracking down in the central anatomic pelvis were there is mesenteric edema and interloop mesenteric fluid. Small bowel is dilated up  to about 2.5 cm in diameter and can be followed to a segment the displays a small bowel feces sign immediately proximal to an abrupt transition zone. The abrupt transition zone is immediately anterior to the staple line of the Hartmann's pouch. The etiology for the obstruction at this level is most likely secondary to an adhesion. There is no overt small bowel wall thickening or gross hyper enhancement. Ileum distal to the transition zone is completely decompressed.  Bone windows reveal no worrisome lytic or sclerotic osseous lesions.  IMPRESSION: Distal small bowel obstruction with an abrupt transition zone in the mid anatomic pelvis, immediately anterior to the suture line at the blind end of the Hartmann's pouch. There is some associated perienteric edema and interloop mesenteric fluid. Imaging features are likely secondary to the presence of an adhesion.  I called these results to Dr. Anitra Lauth at  approximately 1334 hours on 03/14/2013.   Electronically Signed   By: Kennith Center M.D.   On: 03/14/2013 13:35       Assessment/Plan 1. High grade PSBO 2. H/o Crohn's disease 3. COPD Patient Active Problem List   Diagnosis Date Noted  . Chronic respiratory failure 01/19/2013  . Rheumatoid arthritis 05/11/2012  . Cervical dysplasia   . Hematuria 12/05/2011  . Sinus tachycardia 12/03/2011  . Normal coronary arteries 2009, nl-hyperdynamic LVF 2D 2010 12/02/2011  . Low TSH level, 0.08 with T3/T4 WNL 12/02/2011  . Palpitations 12/01/2011  . Anxiety disorder 11/01/2011  . DIABETES, TYPE 2 12/19/2008  . MULTIFOCAL ATRIAL TACHYCARDIA 08/13/2007  . DEPRESSIVE DISORDER NOT ELSEWHERE CLASSIFIED 06/02/2007  . HYPERLIPIDEMIA 01/27/2007  . Other diseases of vocal cords, history of prior trach 01/27/2007  . 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 GERD 01/27/2007  . CROHN'S DISEASE 01/27/2007  . OSTEOPOROSIS 01/27/2007   Plan: 1. The patient will need admission for medical management.  Since she is not actively throwing up, we will hold off on an NGT; however, she will need to remain strictly NPO for now.  She is quite tender.  Unclear if this obstruction is secondary to a flare of crohn's disease or adhesive disease.  The patient has seen Dr. Danise Edge in the past and may benefit from a GI consult this admission for further recommendations. She will need to be treated conservatively, initially, and would recommend a medicine admission given her other medical co-morbidities.  If the patient failed medical management, she may need surgical intervention.  If that were to be the case, she would need pulmonary to clear her and she has the option of transferring to [facility] if she so desired.  We will repeat films in the morning and follow along.  Jaycob Mcclenton E 03/14/2013, 3:20 PM Pager: [phone]

## 2013-03-14 NOTE — H&P (Addendum)
[facility] History and Physical  [name]:[id number] DOB: 1951/04/03 DOA: 03/14/2013  Referring physician: MS PISCIOTTA PCP: [name], MD  Specialists: Dr Laural Benes with [facility].   Chief Complaint: ABDOMINAL pain and nausea, vomiting since this am.   HPI: [name] is a 62 y.o. female with h/o crohn's disease, COPD on oxygen, s/p ileostomy and colectomy in 2007, came in for worsening abdominal pain associated with nausea and vomiting since this morning from 1 am. On arrival ED, she underwent CT abd revealing SBO. Surgery consulted,. Recommended GI consultation. She denies fevers or chills. She was put NPO and referred to medical service for admission.   Review of Systems: The patient denies anorexia, fever, weight loss,, vision loss, decreased hearing, hoarseness, chest pain, syncope, dyspnea on exertion, peripheral edema, balance deficits, hemoptysis, , melena, hematochezia, severe indigestion/heartburn, hematuria, incontinence, genital sores, muscle weakness, suspicious skin lesions, transient blindness, difficulty walking, depression, unusual weight change, abnormal bleeding, enlarged lymph nodes, angioedema, and breast masses.    Past Medical History  Diagnosis Date  . Other diseases of vocal cords   . Osteoporosis, unspecified   . Other and unspecified hyperlipidemia   . Esophageal reflux   . Chronic airway obstruction, not elsewhere classified   . Regional enteritis of unspecified site   . 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 Type II or unspecified type diabetes mellitus without mention of complication, not stated as uncontrolled   . Crohn's disease     Ileostomy  . Pelvic adhesions   . Anxiety   . Depression   . Hypertension   . Shortness of breath   . Pneumonia   . Asthma   . Emphysema   . Hematuria   . Rheumatoid arthritis(714.0) 03/2012    Beeckman  . Severe cervical dysplasia age 59    cone biopsy with neg paps  since   Past Surgical History  Procedure Laterality Date  . Ileostomy    . Vaginal hysterectomy  1993    LAVH-LSO  . Ear cyst excision    . Cervical cone biopsy  1978  . Colon surgery  2007    subtotal colectomy with ileostomy  . Esophagus stretch    . Oophorectomy      LSO  . Tracheostomy  2010  . Hammer toe surgery  1980's    Bilateral Feet  . Balloon dilation  02/10/2012    Procedure: BALLOON DILATION;  Surgeon: Charolett Bumpers, MD;  Location: [facility];  Service: Endoscopy;  Laterality: N/A;  . Colposcopy    . Flexible sigmoidoscopy  03/26/2012    Procedure: FLEXIBLE SIGMOIDOSCOPY;  Surgeon: Charolett Bumpers, MD;  Location: [facility];  Service: Endoscopy;  Laterality: N/A;  . Cardiac catheterization  07/08/2007    normal  . Nm myocar perf wall motion  09/21/2006    no ischemia   Social History:  reports that she quit smoking about 9 years ago. She has never used smokeless tobacco. She reports that she drinks alcohol. She reports that she does not use illicit drugs.  where does patient live--home,  Can patient participate in ADLs?  Allergies  Allergen Reactions  . Esomeprazole Magnesium Other (See Comments)    NEXIUM - reaction > aggravated pt's Crohn's disease  . Other     Beans, Dander/Dust, Peas, Mushrooms  . Peanut-Containing Drug Products Other (See Comments)    unknown  . Shellfish Allergy Other (See Comments)    unknown  . Surgical Lubricant Other (See  Comments)    Burns skin     Family History  Problem Relation Age of Onset  . Osteoporosis Mother   . Kidney disease Mother   . Hypertension Sister   . Stroke Sister   . Diabetes Sister     Prior to Admission medications   Medication Sig Start Date End Date Taking? Authorizing Provider  arformoterol (BROVANA) 15 MCG/2ML NEBU Take 2 mLs (15 mcg total) by nebulization 2 (two) times daily. 12/31/12  Yes Storm Frisk, MD  aspirin 81 MG tablet Take 81 mg by mouth every other day.    Yes Historical  Provider, MD  budesonide (PULMICORT) 0.25 MG/2ML nebulizer solution Take 2 mLs (0.25 mg total) by nebulization 2 (two) times daily. Dx 496 12/01/12 12/01/13 Yes Tammy S Parrett, NP  celecoxib (CELEBREX) 200 MG capsule Take 200 mg by mouth daily.   Yes Historical Provider, MD  Cholecalciferol (VITAMIN D) 1000 UNITS capsule Take 1,000 Units by mouth daily.     Yes Historical Provider, MD  citalopram (CELEXA) 20 MG tablet Take 20 mg by mouth daily.    Yes Historical Provider, MD  diltiazem (CARDIZEM CD) 180 MG 24 hr capsule Take 1 capsule (180 mg total) by mouth 2 (two) times daily. 11/29/12  Yes Mihai Croitoru, MD  famotidine (PEPCID) 20 MG tablet Take 1 tablet (20 mg total) by mouth 2 (two) times daily. 12/05/11  Yes Belkys A Regalado, MD  fluticasone (FLONASE) 50 MCG/ACT nasal spray Place 2 sprays into the nose daily. 03/11/13  Yes Storm Frisk, MD  furosemide (LASIX) 20 MG tablet TAKE 1 TABLET TWICE A DAY AS NEEDED 11/29/12  Yes Tammy S Parrett, NP  guaiFENesin (MUCINEX) 600 MG 12 hr tablet Take 1,200 mg by mouth 2 (two) times daily.    Yes Historical Provider, MD  HYDROcodone-acetaminophen (NORCO/VICODIN) 5-325 MG per tablet Take 1 tablet by mouth every 6 (six) hours as needed for pain.   Yes Historical Provider, MD  hydroxychloroquine (PLAQUENIL) 200 MG tablet Take 1 tablet by mouth Twice daily. 04/20/12  Yes Historical Provider, MD  IRON PO Take 1 tablet by mouth every Monday, Wednesday, and Friday.    Yes Historical Provider, MD  Liraglutide (VICTOZA) 18 MG/3ML SOLN 1.8 units once daily   Yes Historical Provider, MD  LORazepam (ATIVAN) 0.5 MG tablet Take 1 tablet (0.5 mg total) by mouth every 8 (eight) hours as needed for anxiety. 01/14/13  Yes Storm Frisk, MD  metoprolol tartrate (LOPRESSOR) 25 MG tablet Take 0.5 tablets (12.5 mg total) by mouth 2 (two) times daily. 11/05/12  Yes Mihai Croitoru, MD  Multiple Vitamin (MULTIVITAMIN WITH MINERALS) TABS Take 1 tablet by mouth daily.   Yes Historical  Provider, MD  omeprazole (PRILOSEC) 40 MG capsule Take 40 mg by mouth 2 (two) times daily.    Yes Historical Provider, MD  OXYGEN-HELIUM IN Inhale 2.5-3 L into the lungs.   Yes Historical Provider, MD  rosuvastatin (CRESTOR) 5 MG tablet Take 5 mg by mouth every Monday, Wednesday, and Friday.    Yes Historical Provider, MD  tiotropium (SPIRIVA) 18 MCG inhalation capsule Place 1 capsule (18 mcg total) into inhaler and inhale daily. 12/15/12  Yes Storm Frisk, MD  Spacer/Aero-Holding Chambers (AEROCHAMBER PLUS) inhaler Use as instructed 10/08/12   Julio Sicks, NP   Physical Exam: Filed Vitals:   03/14/13 1651  BP: 125/90  Pulse: 111  Temp: 98.3 F (36.8 C)  Resp: 19    Constitutional: Vital signs reviewed.  Patient is a well-developed and well-nourished  in no acute distress and cooperative with exam. Alert and oriented x3.  Head: Normocephalic and atraumatic Mouth: no erythema or exudates, dry MM Eyes: PERRL, EOMI, conjunctivae normal, No scleral icterus.  Neck: Supple, Trachea midline normal ROM, No JVD, mass, thyromegaly, or carotid bruit present.  Cardiovascular: RRR, S1 normal, S2 normal, no MRG, pulses symmetric and intact bilaterally Pulmonary/Chest: normal respiratory effort, CTAB, no wheezes, rales, or rhonchi Abdominal: Soft. Mildly distended, tender diffusely, and umbilical hernia.  bowel sounds are normal, colostomy bag in place.  Musculoskeletal: No joint deformities, erythema, or stiffness, ROM full and no nontender.  Neurological: A&O x3, Strength is normal and symmetric bilaterally, , no focal motor deficit, sensory intact to light touch bilaterally.  Skin: Warm, dry and intact. No rash, cyanosis, or clubbing.  Psychiatric: Normal mood and affect.    Labs on Admission:  Basic Metabolic Panel:  Recent Labs Lab 03/14/13 0940  NA 141  K 4.4  CL 104  CO2 24  GLUCOSE 90  BUN 14  CREATININE 1.00  CALCIUM 9.5   Liver Function Tests:  Recent Labs Lab  03/14/13 0940  AST 25  ALT 13  ALKPHOS 76  BILITOT 0.4  PROT 7.6  ALBUMIN 3.8    Recent Labs Lab 03/14/13 0940  LIPASE 23   No results found for this basename: AMMONIA,  in the last 168 hours CBC:  Recent Labs Lab 03/14/13 0940  WBC 9.3  NEUTROABS 7.9*  HGB 11.6*  HCT 35.0*  MCV 84.3  PLT 223   Cardiac Enzymes: No results found for this basename: CKTOTAL, CKMB, CKMBINDEX, TROPONINI,  in the last 168 hours  BNP (last 3 results) No results found for this basename: PROBNP,  in the last 8760 hours CBG: No results found for this basename: GLUCAP,  in the last 168 hours  Radiological Exams on Admission: Ct Abdomen Pelvis W Contrast  03/14/2013   CLINICAL DATA:  Diffuse abdominal pain. Nausea and vomiting. History of Crohn disease.  EXAM: CT ABDOMEN AND PELVIS WITH CONTRAST  TECHNIQUE: Multidetector CT imaging of the abdomen and pelvis was performed using the standard protocol following bolus administration of intravenous contrast.  CONTRAST:  OMNIPAQUE IOHEXOL 300 MG/ML  SOLN  COMPARISON:  [facility] CT ABDOMEN AND PELVIS WITH CONTRAST from 03/02/2012.  FINDINGS: No focal abnormality is seen in the liver or spleen. The stomach, duodenum, pancreas, gallbladder, and adrenal glands are unremarkable. No evidence for hydronephrosis or renal mass in either kidney. Tiny low-density cortical lesions in the kidneys bilaterally are too small to characterize but are stable and likely represent tiny cysts.  No abdominal aortic aneurysm. There is no free fluid or lymphadenopathy in the abdomen.  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  The patient has dilated small bowel loops tracking down in the central anatomic pelvis were there is mesenteric edema and interloop mesenteric fluid. Small bowel is dilated up to about 2.5 cm in diameter and  can be followed to a segment the displays a small bowel feces sign immediately proximal to an abrupt transition zone. The abrupt transition zone is immediately anterior to the staple line of the Hartmann's pouch. The etiology for the obstruction at this level is most likely secondary to an adhesion. There is no overt small bowel wall thickening or gross hyper enhancement. Ileum distal to the  transition zone is completely decompressed.  Bone windows reveal no worrisome lytic or sclerotic osseous lesions.  IMPRESSION: Distal small bowel obstruction with an abrupt transition zone in the mid anatomic pelvis, immediately anterior to the suture line at the blind end of the Hartmann's pouch. There is some associated perienteric edema and interloop mesenteric fluid. Imaging features are likely secondary to the presence of an adhesion.  I called these results to Dr. Anitra Lauth at approximately 1334 hours on 03/14/2013.   Electronically Signed   By: Kennith Center M.D.   On: 03/14/2013 13:35    EKG: pending.   Assessment/Plan Active Problems:  1. SBO: - admitted to medicine, NPO, IV fluids, pepcid IV.  - repeating abd films in am.  - surgery and GI on board.   2. COPD: Currently not wheezing.  - continue home meds.   3. DVT prophylaxis.   4. Diabetes Mellitus:  - hgba1c,  - SSI.   5. Hypertension; controlled. Resume home meds.   Code Status: full code Family Communication: family at bedside.  Disposition Plan: pending  Time spent: 75 min  Deondra Labrador [facility] Pager [phone]  If 7PM-7AM, please contact night-coverage [email or web address] Password [email or web address] 03/14/2013, 5:02 PM

## 2013-03-14 NOTE — Progress Notes (Signed)
Blood sugar 58, D 50 given. Recheck blood sugar was 279. Will continue to monitor patient.

## 2013-03-14 NOTE — ED Notes (Signed)
Severe abd pain started last night with vomiting-- hx of crohn's ds. States feels like the same

## 2013-03-14 NOTE — ED Provider Notes (Signed)
CSN: [id number]     Arrival date & time 03/14/13  0919 History   First MD Initiated Contact with Patient 03/14/13 [phone]     Chief Complaint  Patient presents with  . Abdominal Pain   (Consider location/radiation/quality/duration/timing/severity/associated sxs/prior Treatment) HPI  [name] is a 62 y.o. female with PMH significant for Crohn's disease s/p ileostomy, COPD on 2-3 L O2 c/o acute onset of epigastric pain with multiple episodes of NBNB emesis this AM associated with subjective fever and chills. Pt denies change in stool consistency or production,  or bladder habits, CP, SOB.  Episodes c/w prior crohn's exacerbations.   GI Laural Benes   Past Medical History  Diagnosis Date  . Other diseases of vocal cords   . Osteoporosis, unspecified   . Other and unspecified hyperlipidemia   . Esophageal reflux   . Chronic airway obstruction, not elsewhere classified   . Regional enteritis of unspecified site   . 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 Type II or unspecified type diabetes mellitus without mention of complication, not stated as uncontrolled   . Crohn's disease     Ileostomy  . Pelvic adhesions   . Anxiety   . Depression   . Hypertension   . Shortness of breath   . Pneumonia   . Asthma   . Emphysema   . Hematuria   . Rheumatoid arthritis(714.0) 03/2012    [facility]  . Severe cervical dysplasia age 30    cone biopsy with neg paps since   Past Surgical History  Procedure Laterality Date  . Ileostomy    . Vaginal hysterectomy  1993    [facility]-LSO  . Ear cyst excision    . Cervical cone biopsy  1978  . Colon surgery  2007    Resection-Ileostomy  . Esophagus stretch    . Oophorectomy      LSO  . Tracheostomy  2010  . Hammer toe surgery  1980's    Bilateral Feet  . Balloon dilation  02/10/2012    Procedure: BALLOON DILATION;  Surgeon: Charolett Bumpers, MD;  Location: [facility];  Service: Endoscopy;  Laterality: N/A;  . Colposcopy    .  Flexible sigmoidoscopy  03/26/2012    Procedure: FLEXIBLE SIGMOIDOSCOPY;  Surgeon: Charolett Bumpers, MD;  Location: [facility];  Service: Endoscopy;  Laterality: N/A;  . Cardiac catheterization  07/08/2007    normal  . Nm myocar perf wall motion  09/21/2006    no ischemia   Family History  Problem Relation Age of Onset  . Osteoporosis Mother   . Kidney disease Mother   . Hypertension Sister   . Stroke Sister   . Diabetes Sister    History  Substance Use Topics  . Smoking status: Former Smoker -- 1.00 packs/day for 32 years    Quit date: 05/27/2003  . Smokeless tobacco: Never Used  . Alcohol Use: 0.0 oz/week     Comment: occasional   OB History   Grav Para Term Preterm Abortions TAB SAB Ect Mult Living   3 0   3     0     Review of Systems 10 systems reviewed and found to be negative, except as noted in the HPI  Allergies  Esomeprazole magnesium; Other; Peanut-containing drug products; Shellfish allergy; and Surgical lubricant  Home Medications   Current Outpatient Rx  Name  Route  Sig  Dispense  Refill  . arformoterol (BROVANA) 15 MCG/2ML NEBU   Nebulization   Take 2  mLs (15 mcg total) by nebulization 2 (two) times daily.   120 mL   5   . aspirin 81 MG tablet   Oral   Take 81 mg by mouth daily.         . budesonide (PULMICORT) 0.25 MG/2ML nebulizer solution   Nebulization   Take 2 mLs (0.25 mg total) by nebulization 2 (two) times daily. Dx 496   120 mL   11   . celecoxib (CELEBREX) 200 MG capsule   Oral   Take 200 mg by mouth daily.         . Cholecalciferol (VITAMIN D) 1000 UNITS capsule   Oral   Take 1,000 Units by mouth daily.           . citalopram (CELEXA) 20 MG tablet   Oral   Take 20 mg by mouth daily.          [name] diltiazem (CARDIZEM CD) 180 MG 24 hr capsule   Oral   Take 1 capsule (180 mg total) by mouth 2 (two) times daily.   60 capsule   2   . famotidine (PEPCID) 20 MG tablet   Oral   Take 1 tablet (20 mg total) by mouth 2 (two)  times daily.   30 tablet   0   . fluticasone (FLONASE) 50 MCG/ACT nasal spray   Nasal   Place 2 sprays into the nose daily.   16 g   6   . furosemide (LASIX) 20 MG tablet      TAKE 1 TABLET TWICE A DAY AS NEEDED   60 tablet   2   . guaiFENesin (MUCINEX) 600 MG 12 hr tablet   Oral   Take by mouth. 2 tablet in the am and 2 tablet in the afternoon         . HYDROcodone-acetaminophen (VICODIN) 5-500 MG per tablet   Oral   Take 1 tablet by mouth every 6 (six) hours as needed. For pain         . hydroxychloroquine (PLAQUENIL) 200 MG tablet   Oral   Take 1 tablet by mouth Twice daily.         . IRON PO   Oral   Take by mouth.         . Liraglutide (VICTOZA) 18 MG/3ML SOLN      1.8 units once daily         . LORazepam (ATIVAN) 0.5 MG tablet   Oral   Take 1 tablet (0.5 mg total) by mouth every 8 (eight) hours as needed for anxiety.   60 tablet   0   . metoprolol tartrate (LOPRESSOR) 25 MG tablet   Oral   Take 0.5 tablets (12.5 mg total) by mouth 2 (two) times daily.   30 tablet   3   . Multiple Vitamin (MULTIVITAMIN WITH MINERALS) TABS   Oral   Take 1 tablet by mouth daily.         [name] omeprazole (PRILOSEC) 40 MG capsule   Oral   Take 40 mg by mouth 2 (two) times daily.          . rosuvastatin (CRESTOR) 5 MG tablet   Oral   Take 5 mg by mouth 3 (three) times a week.         [name] Spacer/Aero-Holding Chambers (AEROCHAMBER PLUS) inhaler      Use as instructed   1 each   0   . tiotropium (SPIRIVA) 18 MCG inhalation capsule  Inhalation   Place 1 capsule (18 mcg total) into inhaler and inhale daily.   90 capsule   3    BP 104/69  Pulse 110  Temp(Src) 98.2 F (36.8 C) (Oral)  Resp 26  Wt 119 lb (53.978 kg)  BMI 22.5 kg/m2  SpO2 100% Physical Exam  Nursing note and vitals reviewed. Constitutional: She is oriented to person, place, and time. She appears well-developed and well-nourished. No distress.  HENT:  Head: Normocephalic.   Mouth/Throat: Oropharynx is clear and moist.  Eyes: Conjunctivae and EOM are normal. Pupils are equal, round, and reactive to light.  Neck: Normal range of motion.  Cardiovascular: Normal rate, regular rhythm and intact distal pulses.   Pulmonary/Chest: Effort normal and breath sounds normal. No stridor. No respiratory distress. She has no wheezes. She has no rales.  Abdominal: Soft. She exhibits no distension and no mass. There is tenderness. There is no rebound and no guarding.  Hypoactive bowel sounds. Patient has exquisite tender palpation of the epigastrium with mild rebound and voluntary guarding.  Musculoskeletal: Normal range of motion. She exhibits no edema.  Neurological: She is alert and oriented to person, place, and time.  Psychiatric: She has a normal mood and affect.    ED Course  Procedures (including critical care time) Labs Review Labs Reviewed  CBC WITH DIFFERENTIAL - Abnormal; Notable for the following:    Hemoglobin 11.6 (*)    HCT 35.0 (*)    Neutrophils Relative % 84 (*)    Neutro Abs 7.9 (*)    Lymphocytes Relative 11 (*)    All other components within normal limits  COMPREHENSIVE METABOLIC PANEL - Abnormal; Notable for the following:    GFR calc non Af Amer 59 (*)    GFR calc Af Amer 68 (*)    All other components within normal limits  URINALYSIS, ROUTINE W REFLEX MICROSCOPIC - Abnormal; Notable for the following:    APPearance HAZY (*)    Ketones, ur 40 (*)    Protein, ur 30 (*)    Leukocytes, UA MODERATE (*)    All other components within normal limits  URINE MICROSCOPIC-ADD ON - Abnormal; Notable for the following:    Squamous Epithelial / LPF FEW (*)    Casts HYALINE CASTS (*)    All other components within normal limits  LACTIC ACID, PLASMA  LIPASE, BLOOD   Imaging Review Ct Abdomen Pelvis W Contrast  03/14/2013   CLINICAL DATA:  Diffuse abdominal pain. Nausea and vomiting. History of Crohn disease.  EXAM: CT ABDOMEN AND PELVIS WITH CONTRAST   TECHNIQUE: Multidetector CT imaging of the abdomen and pelvis was performed using the standard protocol following bolus administration of intravenous contrast.  CONTRAST:  OMNIPAQUE IOHEXOL 300 MG/ML  SOLN  COMPARISON:  [facility] CT ABDOMEN AND PELVIS WITH CONTRAST from 03/02/2012.  FINDINGS: No focal abnormality is seen in the liver or spleen. The stomach, duodenum, pancreas, gallbladder, and adrenal glands are unremarkable. No evidence for hydronephrosis or renal mass in either kidney. Tiny low-density cortical lesions in the kidneys bilaterally are too small to characterize but are stable and likely represent tiny cysts.  No abdominal aortic aneurysm. There is no free fluid or lymphadenopathy in the abdomen.  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  The patient has dilated small bowel loops tracking down in the central anatomic pelvis were  there is mesenteric edema and interloop mesenteric fluid. Small bowel is dilated up to about 2.5 cm in diameter and can be followed to a segment the displays a small bowel feces sign immediately proximal to an abrupt transition zone. The abrupt transition zone is immediately anterior to the staple line of the Hartmann's pouch. The etiology for the obstruction at this level is most likely secondary to an adhesion. There is no overt small bowel wall thickening or gross hyper enhancement. Ileum distal to the transition zone is completely decompressed.  Bone windows reveal no worrisome lytic or sclerotic osseous lesions.  IMPRESSION: Distal small bowel obstruction with an abrupt transition zone in the mid anatomic pelvis, immediately anterior to the suture line at the blind end of the Hartmann's pouch. There is some associated perienteric edema and interloop mesenteric fluid. Imaging features are likely secondary to the  presence of an adhesion.  I called these results to Dr. Anitra Lauth at approximately 1334 hours on 03/14/2013.   Electronically Signed   By: Kennith Center M.D.   On: 03/14/2013 13:35    EKG Interpretation   None       MDM   1. SBO (small bowel obstruction)   2. Crohn's colitis     Filed Vitals:   03/14/13 0930 03/14/13 1117  BP: 104/69 103/67  Pulse: 110 106  Temp: 98.2 F (36.8 C)   TempSrc: Oral   Resp: 26 19  Weight: 119 lb (53.978 kg)   SpO2: 100% 21%     [name] is a 62 y.o. female with past medical history significant for Crohn's disease status post ileostomy, COPD on home O2 complaining of acute onset of severe epigastric pain and multiple episodes of emesis starting this a.m. This pain is consistent with prior Crohn's exacerbations. Abdominal exam is concerning with Patient is followed by gastroenterologist Dr. Letitia Libra. Patient's pain is well-controlled in the ED and she is tolerating by mouth, drinking contrast without issue, she is requesting water and asking to leave. CT shows small bowel obstruction with abrupt transition zone at Hartmann's pouch. I discussed this with C, who have evaluated the pT and recommend a medical admission with GI consult. Pt will be admitted to [facility] Dr. Blake Divine.    Medications  iohexol (OMNIPAQUE) 300 MG/ML solution 25 mL (25 mLs Oral Contrast Given 03/14/13 1029)  morphine 4 MG/ML injection 4 mg (4 mg Intravenous Given 03/14/13 1020)  ondansetron (ZOFRAN) injection 4 mg (4 mg Intravenous Given 03/14/13 1019)  sodium chloride 0.9 % bolus 1,000 mL (1,000 mLs Intravenous New Bag/Given 03/14/13 1005)  iohexol (OMNIPAQUE) 300 MG/ML solution 100 mL (100 mLs Intravenous Contrast Given 03/14/13 1236)   Note: Portions of this report may have been transcribed using voice recognition software. Every effort was made to ensure accuracy; however, inadvertent computerized transcription errors may be present      Wynetta Emery,  PA-C 03/14/13 1648

## 2013-03-14 NOTE — Consult Note (Signed)
Subjective:   HPI  The patient is a 62 year old female with a history of Crohn's disease. She is status post total abdominal colectomy with ileostomy in 2007 which was done at [facility]. She has not been having any symptoms related to her Crohn's disease and in fact has been doing well for quite a long time. Her ileostomy has been functioning well. Suddenly this morning she started to have abdominal pain, nausea, and vomiting. She came to the hospital where she was evaluated and a CT scan showed evidence of a small bowel obstruction. The suggestion on CT is that this is most likely secondary to adhesions.  Review of Systems Denies chest pain or shortness of breath  Past Medical History  Diagnosis Date  . Other diseases of vocal cords   . Osteoporosis, unspecified   . Other and unspecified hyperlipidemia   . Esophageal reflux   . Chronic airway obstruction, not elsewhere classified   . Regional enteritis of unspecified site   . 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 Type II or unspecified type diabetes mellitus without mention of complication, not stated as uncontrolled   . Crohn's disease     Ileostomy  . Pelvic adhesions   . Anxiety   . Depression   . Hypertension   . Shortness of breath   . Pneumonia   . Asthma   . Emphysema   . Hematuria   . Rheumatoid arthritis(714.0) 03/2012    [place]  . Severe cervical dysplasia age 64    cone biopsy with neg paps since   Past Surgical History  Procedure Laterality Date  . Ileostomy    . Vaginal hysterectomy  1993    [facility]-LSO  . Ear cyst excision    . Cervical cone biopsy  1978  . Colon surgery  2007    subtotal colectomy with ileostomy  . Esophagus stretch    . Oophorectomy      LSO  . Tracheostomy  2010  . Hammer toe surgery  1980's    Bilateral Feet  . Balloon dilation  02/10/2012    Procedure: BALLOON DILATION;  Surgeon: Charolett Bumpers, MD;  Location: [facility];  Service: Endoscopy;  Laterality:  N/A;  . Colposcopy    . Flexible sigmoidoscopy  03/26/2012    Procedure: FLEXIBLE SIGMOIDOSCOPY;  Surgeon: Charolett Bumpers, MD;  Location: [facility];  Service: Endoscopy;  Laterality: N/A;  . Cardiac catheterization  07/08/2007    normal  . Nm myocar perf wall motion  09/21/2006    no ischemia   History   Social History  . Marital Status: Divorced    Spouse Name: N/A    Number of Children: N/A  . Years of Education: N/A   Occupational History  . Not on file.   Social History Main Topics  . Smoking status: Former Smoker -- 1.00 packs/day for 32 years    Quit date: 05/27/2003  . Smokeless tobacco: Never Used  . Alcohol Use: 0.0 oz/week     Comment: occasional  . Drug Use: No  . Sexual Activity: No   Other Topics Concern  . Not on file   Social History Narrative  . No narrative on file   family history includes Diabetes in her sister; Hypertension in her sister; Kidney disease in her mother; Osteoporosis in her mother; Stroke in her sister. Current facility-administered medications:arformoterol (BROVANA) nebulizer solution 15 mcg, 15 mcg, Nebulization, BID, Kathlen Mody, MD;  budesonide (PULMICORT) nebulizer solution 0.25 mg, 0.25 mg,  Nebulization, BID, Kathlen Mody, MD;  citalopram (CELEXA) tablet 20 mg, 20 mg, Oral, Daily, Kathlen Mody, MD, 20 mg at 03/14/13 1719 dextrose 5 %-0.9 % sodium chloride infusion, , Intravenous, Continuous, Kathlen Mody, MD, Last Rate: 50 mL/hr at 03/14/13 1831;  dextrose 50 % solution 25 mL, 25 mL, Intravenous, Once PRN, Kathlen Mody, MD;  diltiazem (CARDIZEM CD) 24 hr capsule 180 mg, 180 mg, Oral, BID, Kathlen Mody, MD;  enoxaparin (LOVENOX) injection 40 mg, 40 mg, Subcutaneous, Q24H, Kathlen Mody, MD, 40 mg at 03/14/13 1719 famotidine (PEPCID) IVPB 20 mg, 20 mg, Intravenous, Q12H, Kathlen Mody, MD;  fluticasone (FLONASE) 50 MCG/ACT nasal spray 2 spray, 2 spray, Each Nare, Daily, Kathlen Mody, MD, 2 spray at 03/14/13 1719;  hydroxychloroquine  (PLAQUENIL) tablet 200 mg, 200 mg, Oral, BID, Kathlen Mody, MD;  Melene Muller ON 03/15/2013] insulin aspart (novoLOG) injection 0-9 Units, 0-9 Units, Subcutaneous, TID WC, Kathlen Mody, MD LORazepam (ATIVAN) tablet 0.5 mg, 0.5 mg, Oral, Q8H PRN, Kathlen Mody, MD;  metoprolol tartrate (LOPRESSOR) tablet 12.5 mg, 12.5 mg, Oral, BID, Kathlen Mody, MD;  morphine 2 MG/ML injection 2 mg, 2 mg, Intravenous, Q4H PRN, Kathlen Mody, MD, 2 mg at 03/14/13 1719;  ondansetron (ZOFRAN) injection 4 mg, 4 mg, Intravenous, Q8H PRN, Nicole Pisciotta, PA-C, 4 mg at 03/14/13 1536 promethazine (PHENERGAN) injection 12.5 mg, 12.5 mg, Intravenous, Q6H PRN, Kathlen Mody, MD, 12.5 mg at 03/14/13 1940;  tiotropium (SPIRIVA) inhalation capsule 18 mcg, 18 mcg, Inhalation, Daily, Kathlen Mody, MD Allergies  Allergen Reactions  . Esomeprazole Magnesium Other (See Comments)    NEXIUM - reaction > aggravated pt's Crohn's disease  . Other     Beans, Dander/Dust, Peas, Mushrooms  . Peanut-Containing Drug Products Other (See Comments)    unknown  . Shellfish Allergy Other (See Comments)    unknown  . Surgical Lubricant Other (See Comments)    Burns skin      Objective:     BP 125/90  Pulse 111  Temp(Src) 98.3 F (36.8 C) (Oral)  Resp 19  Ht 5\' 1"  (1.549 m)  Wt 54.432 kg (120 lb)  BMI 22.69 kg/m2  SpO2 93%  She is in no distress  Nonicteric  Heart regular rhythm no murmurs  Lungs clear  Abdomen: Bowel sounds are present, with a few high-pitched sounds, there is some slight distention, and the abdomen is tympanitic. There is some discomfort to palpation, but no rebound.  Laboratory No components found with this basename: d1      Assessment:     #1. Small bowel obstruction. I believe this is most likely secondary to adhesions. The sudden onset of her symptoms related to small bowel obstruction are suggestive of adhesions rather than Crohn's.  #2. History of Crohn's disease, with no recent symptoms suggestive of  activity.      Plan:     Supportive care. IV fluids an NG tube has been held for tonight anyway and we will watch her clinical course. Clinical followup on a daily basis. I do not think steroids are indicated this evening.  Lab Results  Component Value Date   HGB 10.0* 03/14/2013   HGB 11.6* 03/14/2013   HGB 10.1* 12/04/2011   HCT 30.3* 03/14/2013   HCT 35.0* 03/14/2013   HCT 31.2* 12/04/2011   ALKPHOS 76 03/14/2013   ALKPHOS 72 12/02/2011   ALKPHOS 73 07/15/2011   AST 25 03/14/2013   AST 18 12/02/2011   AST 18 07/15/2011   ALT 13 03/14/2013   ALT 13  12/02/2011   ALT 21 07/15/2011   AMYLASE 183* 07/13/2008   AMYLASE 101 11/26/2007

## 2013-03-14 NOTE — Consult Note (Signed)
Patient interviewed and examined, agree with PA note above. CT scan appears most consistent with recurrent Crohn's disease although cannot rule out adhesions. Begin nonoperative management with bowel rest and IV fluids. She has very significant comorbidities with severe COPD and would be a very high operative risk.  Mariella Saa MD, FACS  03/14/2013 5:07 PM

## 2013-03-14 NOTE — ED Notes (Signed)
Pt would like to take home meds- will inform [place], [place]

## 2013-03-15 ENCOUNTER — Inpatient Hospital Stay (HOSPITAL_COMMUNITY): Payer: Medicare Other

## 2013-03-15 ENCOUNTER — Observation Stay (HOSPITAL_COMMUNITY): Payer: Medicare Other

## 2013-03-15 DIAGNOSIS — J438 Other emphysema: Secondary | ICD-10-CM

## 2013-03-15 DIAGNOSIS — J9610 Chronic respiratory failure, unspecified whether with hypoxia or hypercapnia: Secondary | ICD-10-CM

## 2013-03-15 DIAGNOSIS — J441 Chronic obstructive pulmonary disease with (acute) exacerbation: Secondary | ICD-10-CM

## 2013-03-15 DIAGNOSIS — R0902 Hypoxemia: Secondary | ICD-10-CM

## 2013-03-15 LAB — GLUCOSE, CAPILLARY
Glucose-Capillary: 101 mg/dL — ABNORMAL HIGH (ref 70–99)
Glucose-Capillary: 119 mg/dL — ABNORMAL HIGH (ref 70–99)

## 2013-03-15 LAB — HEMOGLOBIN A1C
Hgb A1c MFr Bld: 5.9 % — ABNORMAL HIGH (ref <5.7)
Mean Plasma Glucose: 123 mg/dL — ABNORMAL HIGH (ref <117)

## 2013-03-15 MED ORDER — ONDANSETRON HCL 4 MG/2ML IJ SOLN
4.0000 mg | Freq: Four times a day (QID) | INTRAMUSCULAR | Status: DC | PRN
  Administered 2013-03-17 – 2013-03-22 (×5): 4 mg via INTRAVENOUS
  Filled 2013-03-15 (×5): qty 2

## 2013-03-15 MED ORDER — PHENOL 1.4 % MT LIQD
1.0000 | OROMUCOSAL | Status: DC | PRN
  Administered 2013-03-17: 1 via OROMUCOSAL
  Filled 2013-03-15 (×2): qty 177

## 2013-03-15 MED ORDER — KETOROLAC TROMETHAMINE 15 MG/ML IJ SOLN
15.0000 mg | Freq: Three times a day (TID) | INTRAMUSCULAR | Status: DC | PRN
  Administered 2013-03-15 – 2013-03-16 (×2): 15 mg via INTRAVENOUS
  Filled 2013-03-15 (×2): qty 1

## 2013-03-15 MED ORDER — MORPHINE SULFATE 2 MG/ML IJ SOLN
1.0000 mg | INTRAMUSCULAR | Status: DC | PRN
  Administered 2013-03-15 (×2): 4 mg via INTRAVENOUS
  Filled 2013-03-15 (×2): qty 2

## 2013-03-15 MED ORDER — MORPHINE SULFATE 2 MG/ML IJ SOLN
1.0000 mg | INTRAMUSCULAR | Status: DC | PRN
  Administered 2013-03-15: 4 mg via INTRAVENOUS
  Administered 2013-03-15: 2 mg via INTRAVENOUS
  Filled 2013-03-15 (×2): qty 2

## 2013-03-15 MED ORDER — LIDOCAINE HCL 2 % EX GEL
Freq: Once | CUTANEOUS | Status: AC
  Administered 2013-03-15: 5
  Filled 2013-03-15: qty 5

## 2013-03-15 MED ORDER — CHLORHEXIDINE GLUCONATE CLOTH 2 % EX PADS
6.0000 | MEDICATED_PAD | Freq: Every day | CUTANEOUS | Status: AC
  Administered 2013-03-15 – 2013-03-19 (×5): 6 via TOPICAL

## 2013-03-15 MED ORDER — LORAZEPAM 2 MG/ML IJ SOLN
1.0000 mg | Freq: Once | INTRAMUSCULAR | Status: AC
  Administered 2013-03-15: 1 mg via INTRAVENOUS
  Filled 2013-03-15: qty 1

## 2013-03-15 MED ORDER — MUPIROCIN 2 % EX OINT
1.0000 | TOPICAL_OINTMENT | Freq: Two times a day (BID) | CUTANEOUS | Status: AC
Start: 2013-03-15 — End: 2013-03-19
  Administered 2013-03-15 – 2013-03-19 (×10): 1 via NASAL
  Filled 2013-03-15 (×2): qty 22

## 2013-03-15 NOTE — Progress Notes (Signed)
Pt called out for pain medicine after lab drew her blood. Went to assess the patients pain, patient is asleep. Will continue to monitor.

## 2013-03-15 NOTE — Progress Notes (Signed)
[facility] PROGRESS NOTE Subjective Patient still bloated with abdominal pain. Not passing any stool or air into her ostomy bag.  Objective: Vital signs in last 24 hours: Temp:  [98.3 F (36.8 C)-98.9 F (37.2 C)] 98.9 F (37.2 C) (10/21 0518) Pulse Rate:  [107-111] 107 (10/21 0518) Resp:  [12-19] 19 (10/21 0518) BP: (106-125)/(60-90) 123/62 mmHg (10/21 0518) SpO2:  [93 %-100 %] 98 % (10/21 0859) Weight:  [54.432 kg (120 lb)] 54.432 kg (120 lb) (10/20 1651) Last BM Date: 03/14/13  Intake/Output from previous day: 10/20 0701 - 10/21 0700 In: 131.7 [P.O.:120; I.V.:11.7] Out: -  Intake/Output this shift:    PE:  Lungs--clear Abdomen--distended with few bowel sounds and mildly tender  Lab Results:  Recent Labs  03/14/13 0940 03/14/13 1700  WBC 9.3 8.6  HGB 11.6* 10.0*  HCT 35.0* 30.3*  PLT 223 190   BMET  Recent Labs  03/14/13 0940 03/14/13 1700  NA 141  --   K 4.4  --   CL 104  --   CO2 24  --   CREATININE 1.00 0.98   LFT  Recent Labs  03/14/13 0940  PROT 7.6  AST 25  ALT 13  ALKPHOS 76  BILITOT 0.4   PT/INR No results found for this basename: LABPROT, INR,  in the last 72 hours PANCREAS  Recent Labs  03/14/13 0940  LIPASE 23         Studies/Results: Ct Abdomen Pelvis W Contrast  03/14/2013   CLINICAL DATA:  Diffuse abdominal pain. Nausea and vomiting. History of Crohn disease.  EXAM: CT ABDOMEN AND PELVIS WITH CONTRAST  TECHNIQUE: Multidetector CT imaging of the abdomen and pelvis was performed using the standard protocol following bolus administration of intravenous contrast.  CONTRAST:  OMNIPAQUE IOHEXOL 300 MG/ML  SOLN  COMPARISON:  [facility] CT ABDOMEN AND PELVIS WITH CONTRAST from 03/02/2012.  FINDINGS: No focal abnormality is seen in the liver or spleen. The stomach, duodenum, pancreas, gallbladder, and adrenal glands are unremarkable. No evidence for hydronephrosis or renal mass in either kidney.  Tiny low-density cortical lesions in the kidneys bilaterally are too small to characterize but are stable and likely represent tiny cysts.  No abdominal aortic aneurysm. There is no free fluid or lymphadenopathy in the abdomen.  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  The patient has dilated small bowel loops tracking down in the central anatomic pelvis were there is mesenteric edema and interloop mesenteric fluid. Small bowel is dilated up to about 2.5 cm in diameter and can be followed to a segment the displays a small bowel feces sign immediately proximal to an abrupt transition zone. The abrupt transition zone is immediately anterior to the staple line of the Hartmann's pouch. The etiology for the obstruction at this level is most likely secondary to an adhesion. There is no overt small bowel wall thickening or gross hyper enhancement. Ileum distal to the transition zone is completely decompressed.  Bone windows reveal no worrisome lytic or sclerotic osseous lesions.  IMPRESSION: Distal small bowel obstruction with an abrupt transition zone in the mid anatomic pelvis, immediately anterior to the suture line at the blind end of the Hartmann's pouch. There is some associated perienteric edema and interloop mesenteric fluid. Imaging features are likely secondary to the presence of an adhesion.  I called these results to Dr. Anitra Lauth at approximately  1334 hours on 03/14/2013.   Electronically Signed   By: Kennith Center M.D.   On: 03/14/2013 13:35   Dg Abd 2 Views  03/15/2013   CLINICAL DATA:  Small bowel obstruction. Distended abdomen. FOLLOW UP.  EXAM: ABDOMEN - 2 VIEW  COMPARISON:  03/14/2013  FINDINGS: Continued small bowel obstruction pattern with dilated small bowel loops and air-fluid levels. Suspect no real change since prior CT. No free air or organomegaly. No suspicious  calcifications. No acute bony abnormality. Lung bases are clear.  IMPRESSION: Stable small bowel obstruction pattern.   Electronically Signed   By: Charlett Nose M.D.   On: 03/15/2013 08:37    Medications: I have reviewed the patient's current medications.  Assessment/Plan: 1. SBO. Doubt this is a result of active Crohn's probably adhesions. Agree with placement of NG tube. Patient likely needs surgery.   Macie Baum JR,Elissia Spiewak L 03/15/2013, 11:52 AM

## 2013-03-15 NOTE — Progress Notes (Signed)
Patient ID: [name], female   DOB: 12-21-1950, 62 y.o.   MRN: [id number]    Subjective: Pt had a bad night.  Having multiple bouts of bilious emesis.  Having severe abdominal pain.  Crying and terrified of getting an NGT.  Objective: Vital signs in last 24 hours: Temp:  [98.2 F (36.8 C)-98.9 F (37.2 C)] 98.9 F (37.2 C) (10/21 0518) Pulse Rate:  [106-111] 107 (10/21 0518) Resp:  [12-26] 19 (10/21 0518) BP: (103-125)/(60-90) 123/62 mmHg (10/21 0518) SpO2:  [21 %-100 %] 96 % (10/21 0518) Weight:  [119 lb (53.978 kg)-120 lb (54.432 kg)] 120 lb (54.432 kg) (10/20 1651) Last BM Date: 03/14/13  Intake/Output from previous day: 10/20 0701 - 10/21 0700 In: 131.7 [P.O.:120; I.V.:11.7] Out: -  Intake/Output this shift:    PE: Abd: soft, but tender, few BS, no output in her ileostomy.  Patient would not hardly let me examine her abdomen because she is scared that it will hurt  Lab Results:   Recent Labs  03/14/13 0940 03/14/13 1700  WBC 9.3 8.6  HGB 11.6* 10.0*  HCT 35.0* 30.3*  PLT 223 190   BMET  Recent Labs  03/14/13 0940 03/14/13 1700  NA 141  --   K 4.4  --   CL 104  --   CO2 24  --   GLUCOSE 90  --   BUN 14  --   CREATININE 1.00 0.98  CALCIUM 9.5  --    PT/INR No results found for this basename: LABPROT, INR,  in the last 72 hours CMP     Component Value Date/Time   NA 141 03/14/2013 0940   K 4.4 03/14/2013 0940   CL 104 03/14/2013 0940   CO2 24 03/14/2013 0940   GLUCOSE 90 03/14/2013 0940   BUN 14 03/14/2013 0940   CREATININE 0.98 03/14/2013 1700   CREATININE 1.04 08/27/2012 1054   CALCIUM 9.5 03/14/2013 0940   CALCIUM 9.0 07/15/2011 0920   PROT 7.6 03/14/2013 0940   ALBUMIN 3.8 03/14/2013 0940   AST 25 03/14/2013 0940   ALT 13 03/14/2013 0940   ALKPHOS 76 03/14/2013 0940   BILITOT 0.4 03/14/2013 0940   GFRNONAA 60* 03/14/2013 1700   GFRAA 70* 03/14/2013 1700   Lipase     Component Value Date/Time   LIPASE 23 03/14/2013 0940        Studies/Results: Ct Abdomen Pelvis W Contrast  03/14/2013   CLINICAL DATA:  Diffuse abdominal pain. Nausea and vomiting. History of Crohn disease.  EXAM: CT ABDOMEN AND PELVIS WITH CONTRAST  TECHNIQUE: Multidetector CT imaging of the abdomen and pelvis was performed using the standard protocol following bolus administration of intravenous contrast.  CONTRAST:  OMNIPAQUE IOHEXOL 300 MG/ML  SOLN  COMPARISON:  [facility] CT ABDOMEN AND PELVIS WITH CONTRAST from 03/02/2012.  FINDINGS: No focal abnormality is seen in the liver or spleen. The stomach, duodenum, pancreas, gallbladder, and adrenal glands are unremarkable. No evidence for hydronephrosis or renal mass in either kidney. Tiny low-density cortical lesions in the kidneys bilaterally are too small to characterize but are stable and likely represent tiny cysts.  No abdominal aortic aneurysm. There is no free fluid or lymphadenopathy in the abdomen.  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  The patient has dilated small bowel loops tracking down in the central anatomic pelvis were there is  mesenteric edema and interloop mesenteric fluid. Small bowel is dilated up to about 2.5 cm in diameter and can be followed to a segment the displays a small bowel feces sign immediately proximal to an abrupt transition zone. The abrupt transition zone is immediately anterior to the staple line of the Hartmann's pouch. The etiology for the obstruction at this level is most likely secondary to an adhesion. There is no overt small bowel wall thickening or gross hyper enhancement. Ileum distal to the transition zone is completely decompressed.  Bone windows reveal no worrisome lytic or sclerotic osseous lesions.  IMPRESSION: Distal small bowel obstruction with an abrupt transition zone in the mid anatomic pelvis,  immediately anterior to the suture line at the blind end of the Hartmann's pouch. There is some associated perienteric edema and interloop mesenteric fluid. Imaging features are likely secondary to the presence of an adhesion.  I called these results to Dr. Anitra Lauth at approximately 1334 hours on 03/14/2013.   Electronically Signed   By: Kennith Center M.D.   On: 03/14/2013 13:35    Anti-infectives: Anti-infectives   Start     Dose/Rate Route Frequency Ordered Stop   03/14/13 2200  hydroxychloroquine (PLAQUENIL) tablet 200 mg     200 mg Oral 2 times daily 03/14/13 1624         Assessment/Plan  1. High grade PSBO 2. H/o crohn's disease 3. Severe COPD  Plan: 1. Patient really needs an NGT.  She desperately does not want one.  Unfortunately, she would like to be put to sleep for this.  This is not possible.  Radiology does not sedate for their procedure either.  I will give her 1mg  of ativan, morphine, and lidocaine jelly to help with insertion.  I'm not sure yet whether the patient will be agreeable to this or not.  i informed her, this would be one of the best ways to potentially avoid an operation or aspiration which certainly would not be a good thing considering her pulmonary status. 2. Repeat films in the morning. 3. Await films from today.  She is going down now for these.   LOS: 1 day    Renne Platts E 03/15/2013, 8:15 AM Pager: [phone]

## 2013-03-15 NOTE — Consult Note (Signed)
PULMONARY  / CRITICAL CARE MEDICINE  Name: [name] MRN: [id number] DOB: 21-May-1951    ADMISSION DATE:  03/14/2013 CONSULTATION DATE:  03/15/2013  REFERRING MD :  Arthor Captain PRIMARY SERVICE:  Internal Medicine  CHIEF COMPLAINT:  Pre-surgical clearance needed for SBO  BRIEF PATIENT DESCRIPTION: 62 y.o. F with multiple medical co-morbidities including Gold Stage D COPD, followed by Dr. [name].  Pt presented to ED on 10/20 with diffuse abdominal pain, found to have SBO.  PCCM consulted for pre-surgical clearance.  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 female with PMH of crohn's disease, COPD on oxygen, s/p ileostomy and colectomy in 2007.  She presented to the [facility] ED on 10/20 with worsening abdominal pain associated with nausea and vomiting x 1 day.  In ED, an abdominal CT was performed revealing a SBO. Surgery was consulted who plans to take the pt to the OR.  Surgery wanted to get pre-op clearance from a Pulmonary stand point since Mrs. Pirozzi has gold stage D COPD, followed by Dr. [name] in the office.   Her maintenance meds include Spiriva + brovana + budesonide.  She reports that her breathing was doing well prior to this hospitalization.  She denies having any recent difficulty in regards to breathing.   She also denies having to use her inhalers more than usual, worsening wheezing, recent cough, fevers, chills, sweats, chest pain, SOB.   She does report some mild throat and chest discomfort currently, but attributes this to her NG tube that was recently inserted.  She is also having continued abdominal pain/discomfort from her SBO.  PAST MEDICAL HISTORY :  Past Medical History  Diagnosis Date  . Other diseases of vocal cords   . Osteoporosis, unspecified   . Other and  unspecified hyperlipidemia   . Esophageal reflux   . Chronic airway obstruction, not elsewhere classified   . Regional enteritis of unspecified site   . 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 Type II or unspecified type diabetes mellitus without mention of complication, not stated as uncontrolled   . Crohn's disease     Ileostomy  . Pelvic adhesions   . Anxiety   . Depression   . Hypertension   . Shortness of breath   . Pneumonia   . Asthma   . Emphysema   . Hematuria   . Rheumatoid arthritis(714.0) 03/2012    [place]  . Severe cervical dysplasia age 6    cone biopsy with neg paps since   Past Surgical History  Procedure Laterality Date  . Ileostomy    . Vaginal hysterectomy  1993    LAVH-LSO  . Ear cyst excision    . Cervical cone biopsy  1978  . Colon surgery  2007    subtotal colectomy with ileostomy  . Esophagus stretch    . Oophorectomy      LSO  . Tracheostomy  2010  . Hammer toe surgery  1980's    Bilateral Feet  . Balloon dilation  02/10/2012    Procedure: BALLOON DILATION;  Surgeon: Charolett Bumpers, MD;  Location: [facility];  Service: Endoscopy;  Laterality: N/A;  . Colposcopy    . Flexible sigmoidoscopy  03/26/2012    Procedure: FLEXIBLE SIGMOIDOSCOPY;  Surgeon: Charolett Bumpers, MD;  Location: [facility];  Service: Endoscopy;  Laterality: N/A;  . Cardiac catheterization  07/08/2007    normal  . Nm myocar perf wall motion  09/21/2006    no ischemia   Prior to Admission medications   Medication Sig Start Date End Date Taking? Authorizing Provider  arformoterol (BROVANA) 15 MCG/2ML NEBU Take 2 mLs (15 mcg total) by nebulization 2 (two) times daily. 12/31/12  Yes Storm Frisk, MD  aspirin 81 MG tablet Take 81 mg by mouth every other day.    Yes Historical Provider, MD  budesonide (PULMICORT) 0.25 MG/2ML nebulizer solution Take 2 mLs (0.25 mg total) by nebulization 2 (two) times daily. Dx 496 12/01/12 12/01/13 Yes Tammy S Parrett, NP   celecoxib (CELEBREX) 200 MG capsule Take 200 mg by mouth daily.   Yes Historical Provider, MD  Cholecalciferol (VITAMIN D) 1000 UNITS capsule Take 1,000 Units by mouth daily.     Yes Historical Provider, MD  citalopram (CELEXA) 20 MG tablet Take 20 mg by mouth daily.    Yes Historical Provider, MD  diltiazem (CARDIZEM CD) 180 MG 24 hr capsule Take 1 capsule (180 mg total) by mouth 2 (two) times daily. 11/29/12  Yes Mihai Croitoru, MD  famotidine (PEPCID) 20 MG tablet Take 1 tablet (20 mg total) by mouth 2 (two) times daily. 12/05/11  Yes Belkys A Regalado, MD  fluticasone (FLONASE) 50 MCG/ACT nasal spray Place 2 sprays into the nose daily. 03/11/13  Yes Storm Frisk, MD  furosemide (LASIX) 20 MG tablet TAKE 1 TABLET TWICE A DAY AS NEEDED 11/29/12  Yes Tammy S Parrett, NP  guaiFENesin (MUCINEX) 600 MG 12 hr tablet Take 1,200 mg by mouth 2 (two) times daily.    Yes Historical Provider, MD  HYDROcodone-acetaminophen (NORCO/VICODIN) 5-325 MG per tablet Take 1 tablet by mouth every 6 (six) hours as needed for pain.   Yes Historical Provider, MD  hydroxychloroquine (PLAQUENIL) 200 MG tablet Take 1 tablet by mouth Twice daily. 04/20/12  Yes Historical Provider, MD  IRON PO Take 1 tablet by mouth every Monday, Wednesday, and Friday.    Yes Historical Provider, MD  Liraglutide (VICTOZA) 18 MG/3ML SOLN 1.8 units once daily   Yes Historical Provider, MD  LORazepam (ATIVAN) 0.5 MG tablet Take 1 tablet (0.5 mg total) by mouth every 8 (eight) hours as needed for anxiety. 01/14/13  Yes Storm Frisk, MD  metoprolol tartrate (LOPRESSOR) 25 MG tablet Take 0.5 tablets (12.5 mg total) by mouth 2 (two) times daily. 11/05/12  Yes Mihai Croitoru, MD  Multiple Vitamin (MULTIVITAMIN WITH MINERALS) TABS Take 1 tablet by mouth daily.   Yes Historical Provider, MD  omeprazole (PRILOSEC) 40 MG capsule Take 40 mg by mouth 2 (two) times daily.    Yes Historical Provider, MD  OXYGEN-HELIUM IN Inhale 2.5-3 L into the lungs.   Yes  Historical Provider, MD  rosuvastatin (CRESTOR) 5 MG tablet Take 5 mg by mouth every Monday, Wednesday, and Friday.    Yes Historical Provider, MD  tiotropium (SPIRIVA) 18 MCG inhalation capsule Place 1 capsule (18 mcg total) into inhaler and inhale daily. 12/15/12  Yes Storm Frisk, MD  Spacer/Aero-Holding Chambers (AEROCHAMBER PLUS) inhaler Use as instructed 10/08/12   Julio Sicks, NP   Allergies  Allergen Reactions  . Esomeprazole Magnesium Other (See Comments)    NEXIUM - reaction > aggravated pt's Crohn's disease  . Other     Beans, Dander/Dust, Peas, Mushrooms  . Peanut-Containing Drug Products Other (See Comments)  unknown  . Shellfish Allergy Other (See Comments)    unknown  . Surgical Lubricant Other (See Comments)    Burns skin     FAMILY HISTORY:  Family History  Problem Relation Age of Onset  . Osteoporosis Mother   . Kidney disease Mother   . Hypertension Sister   . Stroke Sister   . Diabetes Sister    SOCIAL HISTORY:  reports that she quit smoking about 9 years ago. She has never used smokeless tobacco. She reports that she drinks alcohol. She reports that she does not use illicit drugs.  REVIEW OF SYSTEMS:   Constitutional: Negative for fever, chills, weight loss, malaise/fatigue and diaphoresis.  HENT: Negative for hearing loss, ear pain, nosebleeds, congestion, neck pain, tinnitus and ear discharge. Positive sore throat.   Eyes: Negative for blurred vision, double vision, photophobia, pain, discharge and redness.  Respiratory: Negative for cough, hemoptysis, sputum production, shortness of breath, wheezing and stridor.   Cardiovascular: Negative for chest pain, palpitations, orthopnea, claudication, leg swelling and PND.  Gastrointestinal: Negative for heartburn, diarrhea, constipation, blood in stool and melena. Positive nausea, vomiting, abdominal pain. Genitourinary: Negative for dysuria, urgency, frequency, hematuria and flank pain.   Musculoskeletal: Negative for myalgias, back pain, joint pain and falls.  Skin: Negative for itching and rash.  Neurological: Negative for dizziness, tingling, tremors, sensory change, speech change, focal weakness, seizures, loss of consciousness, weakness and headaches.  Endo/Heme/Allergies: Negative for environmental allergies and polydipsia. Does not bruise/bleed easily.  SUBJECTIVE:  "Breathing is fine but this tube in my nose makes breathing and talking uncomfortable"  VITAL SIGNS: Temp:  [98.3 F (36.8 C)-98.9 F (37.2 C)] 98.9 F (37.2 C) (10/21 0518) Pulse Rate:  [107-111] 107 (10/21 0518) Resp:  [12-19] 19 (10/21 0518) BP: (106-125)/(60-90) 123/62 mmHg (10/21 0518) SpO2:  [93 %-100 %] 98 % (10/21 0859) Weight:  [120 lb (54.432 kg)] 120 lb (54.432 kg) (10/20 1651)  PHYSICAL EXAMINATION: General:  Pleasant female, lying in bed, appears uncomfortable but is in NAD. Neuro:  A&O x 3.  Mood and affect appropriate. HEENT:  [place]/AT.  PERRL.  MMM.  NG tube in place, 200cc of bilious drainage in canister. Neck:  No LAD.  Trachea midline.  No JVD. Cardiovascular:  RRR, no M/R/G. Lungs:  Resp's even and unlabored.  Decreased respiratory effort due to splinting, likely because of abdominal pain.  Mild expiratory wheeze bilaterally, otherwise CTA. Abdomen:  BS hypoactive x 4.  Colostomy bag in place RUQ.  Abdomen is soft, tender to palpation. Musculoskeletal:  No gross deformities.  No edema. Skin:  Warm and dry.  Recent Labs Lab 03/14/13 0940 03/14/13 1700  NA 141  --   K 4.4  --   CL 104  --   CO2 24  --   BUN 14  --   CREATININE 1.00 0.98  GLUCOSE 90  --    Recent Labs Lab 03/14/13 0940 03/14/13 1700  HGB 11.6* 10.0*  HCT 35.0* 30.3*  WBC 9.3 8.6  PLT 223 190   Ct Abdomen Pelvis W Contrast  03/14/2013   CLINICAL DATA:  Diffuse abdominal pain. Nausea and vomiting. History of Crohn disease.  EXAM: CT ABDOMEN AND PELVIS WITH CONTRAST  TECHNIQUE: Multidetector CT  imaging of the abdomen and pelvis was performed using the standard protocol following bolus administration of intravenous contrast.  CONTRAST:  OMNIPAQUE IOHEXOL 300 MG/ML  SOLN  COMPARISON:  [facility] CT ABDOMEN AND PELVIS WITH CONTRAST from 03/02/2012.  FINDINGS:  No focal abnormality is seen in the liver or spleen. The stomach, duodenum, pancreas, gallbladder, and adrenal glands are unremarkable. No evidence for hydronephrosis or renal mass in either kidney. Tiny low-density cortical lesions in the kidneys bilaterally are too small to characterize but are stable and likely represent tiny cysts.  No abdominal aortic aneurysm. There is no free fluid or lymphadenopathy in the abdomen.  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  The patient has dilated small bowel loops tracking down in the central anatomic pelvis were there is mesenteric edema and interloop mesenteric fluid. Small bowel is dilated up to about 2.5 cm in diameter and can be followed to a segment the displays a small bowel feces sign immediately proximal to an abrupt transition zone. The abrupt transition zone is immediately anterior to the staple line of the Hartmann's pouch. The etiology for the obstruction at this level is most likely secondary to an adhesion. There is no overt small bowel wall thickening or gross hyper enhancement. Ileum distal to the transition zone is completely decompressed.  Bone windows reveal no worrisome lytic or sclerotic osseous lesions.  IMPRESSION: Distal small bowel obstruction with an abrupt transition zone in the mid anatomic pelvis, immediately anterior to the suture line at the blind end of the Hartmann's pouch. There is some associated perienteric edema and interloop mesenteric fluid. Imaging features are likely secondary to the presence of an adhesion.  I called  these results to Dr. Anitra Lauth at approximately 1334 hours on 03/14/2013.   Electronically Signed   By: Kennith Center M.D.   On: 03/14/2013 13:35   Dg Abd 2 Views  03/15/2013   CLINICAL DATA:  Small bowel obstruction. Distended abdomen. FOLLOW UP.  EXAM: ABDOMEN - 2 VIEW  COMPARISON:  03/14/2013  FINDINGS: Continued small bowel obstruction pattern with dilated small bowel loops and air-fluid levels. Suspect no real change since prior CT. No free air or organomegaly. No suspicious calcifications. No acute bony abnormality. Lung bases are clear.  IMPRESSION: Stable small bowel obstruction pattern.   Electronically Signed   By: Charlett Nose M.D.   On: 03/15/2013 08:37    ASSESSMENT / PLAN:  SBO - NGT placed today.  Surgery following, will re-evaluate tmrw AM and decide whether NGT has resulted in acceptable decompression vs needing to proceed with laparotomy. Chronic Respiratory Failure - in the setting of COPD (Gold stage D with emphysematous and asthmatic bronchitic component)  Plan: - Will get pCXR now. - If CXR is unremarkable, then from a pulmonary standpoint, pt appears stable enough to proceed with surgery tomorrow if necessary at a moderate pulmonary risk. - I have explained to her that if her respiratory status were to decline for any reason, she may need temporary ventilatory support via non-invasive or intubation.  She is OK with these temporary measures. - Maintain inhaled medications as prescribed  - Supplemental O2, maintain SpO2 > 90% - Thank you for the consult, we are happy to help further if needed.  Rutherford Guys, [place] - S Pulmonary and Critical Care Medicine [facility] Pager: [phone]  03/15/2013, 1:45 PM  Patient is a moderate risk surgical patient from a pulmonary standpoint.  Recommend continuation of bronchodilatory regiment at current dosing.  Will need very aggressive pulmonary hygienes post extubation as with abdominal procedure and obstructive lung disease  patients will easily atelectasis and subsequent PNA.  Will follow post op.  Patient seen and examined, agree with above note.  I dictated the care and orders written for this patient under my direction.  Alyson Reedy, MD [phone]

## 2013-03-15 NOTE — Progress Notes (Signed)
Patient is currently active with [facility] Care Management for chronic disease management services.  Patient has been engaged by a [employer].  Our community based plan of care has focused on disease management of Chronic Airway Obstruction.  Patient will receive a post discharge transition of care call and will be evaluated for monthly home visits for assessments and disease process education.  Made Letha Cape Inpatient Case Manager aware that [facility] Care Management following. Of note, [facility] Care Management services does not replace or interfere with any services that are arranged by inpatient case management or social work.  For additional questions or referrals please contact Anibal Henderson BSN RN [facility] Liaison at [phone].

## 2013-03-15 NOTE — ED Provider Notes (Signed)
Medical screening examination/treatment/procedure(s) were performed by non-physician practitioner and as supervising physician I was immediately available for consultation/collaboration.   Gwyneth Sprout, MD 03/15/13 [phone]

## 2013-03-15 NOTE — Progress Notes (Signed)
Patient interviewed and examined, agree with PA note above. She likely will require laparotomy.  Needs to have NG placed.  Will ask CCM to see re perioperative pulmonary management  Mariella Saa MD, FACS  03/15/2013 10:01 AM

## 2013-03-15 NOTE — Progress Notes (Signed)
Addendum  Patient seen and examined, chart and data base reviewed.  I agree with the above assessment and plan.  For full details please see Mrs. Algis Downs PA note.  NGT placed for decompression. Per Dr. Johna Sheriff note patient likely would require laparotomy.   Clint Lipps, MD [facility] Pager: [phone] 03/15/2013, 2:11 PM

## 2013-03-15 NOTE — Care Management Note (Addendum)
    Page 1 of 2   03/21/2013     3:48:05 PM   CARE MANAGEMENT NOTE 03/21/2013  Patient:  [name], [name]   Account Number:  [id number]  Date Initiated:  03/15/2013  Documentation initiated by:  Letha Cape  Subjective/Objective Assessment:   dx sbo  admit- lives with family.     Action/Plan:   Pt is followed by [facility]   Anticipated DC Date:  03/22/2013   Anticipated DC Plan:  HOME W HOME HEALTH SERVICES      DC Planning Services  CM consult      [facility] Choice  HOME HEALTH   Choice offered to / List presented to:  C-1 Patient        HH arranged  HH-1 RN  HH-2 PT  HH-4 NURSE'S AIDE      HH agency  [facility]   Status of service:  Completed, signed off Medicare Important Message given?   (If response is "NO", the following Medicare IM given date fields will be blank) Date Medicare IM given:   Date Additional Medicare IM given:    Discharge Disposition:  HOME W HOME HEALTH SERVICES  Per UR Regulation:  Reviewed for med. necessity/level of care/duration of stay  If discussed at Long Length of Stay Meetings, dates discussed:    Comments:  03/21/13 14:53 Letha Cape RN, BSN [phone] patient for dc tomorrow, [facility] notified.  Patient states she has home oxygen with [employer], and she needs [name] refill on her tanks.  She states she will let her sister or her boyfriend know to bring [name] tank when they come to pick her up. Patient will be in home iv abx.  03/20/13 15:20 CM spoke with pt in room for choice.  Pt states she has an aide who come in once [name] month to help her out.  Pt states she gets her ostomy supplies form [facility] in the mail.  She is well versed in the management of her permanent ileostomy (since 2007).  Pt requests [facility] for her HHPT/aide/RN for dressing changes.  Address and contact number verified.  Referral for HHPT/RN/aide faxed to [facility] with pending discharge 10/27-28.  No other CM needs were communicated.  Freddy Jaksch, BSN, [place]  [phone].   03/17/13- 1100- Donn Pierini RN, BSN [phone] pt S/p Exploratory laparotomy, LOA, insertion of biologic mesh on 03/16/13- still with NTG to suction today- NCM to follow post op progression for d/c needs (pt has been at [facility] in the past)

## 2013-03-15 NOTE — Progress Notes (Signed)
[facility] PROGRESS NOTE  [name]:[id number] DOB: 1950-09-12 DOA: 03/14/2013 PCP: [name] Aliment, MD  Assessment/Plan: 62 yo female with a history of Crohn's status post colostomy, COPD, DM, HTN who presented to the ED on 10/20 with abdominal pain and was found to have a SBO on CT scan.   SBO Adhesions vs. Crohn's Appreciate both surgery and GI consultation. Supportive care and she is NPO. She is going to have a NG tube placed. Monitor potassium Ambulate patient Minimize narcotics and anticholingerics.  Pulmonology consulted for presurgical clearance.  Crohn's  S/P total colectomy.  Cared for by [facility].  COPD Cared for by Dr. Levonne Lapping on current home medications   Diabetes Check hbg A1C On Victoza at home She is on sliding scale sensitive as she is NPO  HTN Well controlled on Cardizem and Metoprolol  Anxiety Not well controlled On ativan and celexa   Code Status: full code Family Communication:  Disposition Plan: inpatient   Consultants:  General Surgery  GI  Pulmonary   Procedures: None  Antibiotics:    HPI/Subjective: Repeatedly asking for pain medicine and complaining about pain.  Objective: Filed Vitals:   03/15/13 0518  BP: 123/62  Pulse: 107  Temp: 98.9 F (37.2 C)  Resp: 19    Intake/Output Summary (Last 24 hours) at 03/15/13 1319 Last data filed at 03/15/13 1200  Gross per 24 hour  Intake 131.67 ml  Output    750 ml  Net -618.33 ml   Filed Weights   03/14/13 0930 03/14/13 1651  Weight: 53.978 kg (119 lb) 54.432 kg (120 lb)    Exam:   General: In moderate distress, awake, alert, and oriented. She was lying in bed.  Cardiovascular: RRR, no rubs, murmurs, or gallops  Respiratory: clear bilaterally in all fields  Abdomen: Soft, disproportionally  Tender, + bowel sounds, umbilical hernia that was reducible.  Musculoskeletal: No swelling in extremities. She could move all 4 extremities.    Psych: She is alert and oriented she appears anxious and generally cooperative.   Data Reviewed: Basic Metabolic Panel:  Recent Labs Lab 03/14/13 0940 03/14/13 1700  NA 141  --   K 4.4  --   CL 104  --   CO2 24  --   GLUCOSE 90  --   BUN 14  --   CREATININE 1.00 0.98  CALCIUM 9.5  --    Liver Function Tests:  Recent Labs Lab 03/14/13 0940  AST 25  ALT 13  ALKPHOS 76  BILITOT 0.4  PROT 7.6  ALBUMIN 3.8    Recent Labs Lab 03/14/13 0940  LIPASE 23   CBC:  Recent Labs Lab 03/14/13 0940 03/14/13 1700  WBC 9.3 8.6  NEUTROABS 7.9*  --   HGB 11.6* 10.0*  HCT 35.0* 30.3*  MCV 84.3 85.1  PLT 223 190  CBG:  Recent Labs Lab 03/14/13 1721 03/14/13 1737 03/14/13 2032 03/15/13 0749 03/15/13 1207  GLUCAP 58* 279* 111* 124* 93    Recent Results (from the past 240 hour(s))  MRSA PCR SCREENING     Status: Abnormal   Collection Time    03/14/13  9:45 PM      Result Value Range Status   MRSA by PCR POSITIVE (*) NEGATIVE Final   Comment:            The GeneXpert MRSA Assay (FDA     approved for NASAL specimens     only), is one component of a  comprehensive MRSA colonization     surveillance program. It is not     intended to diagnose MRSA     infection nor to guide or     monitor treatment for     MRSA infections.     RESULT CALLED TO, READ BACK BY AND VERIFIED WITH:     CALLED TO RN ARIEL [facility] [id number] @0041  [name]     Studies: Ct Abdomen Pelvis W Contrast  03/14/2013   CLINICAL DATA:  Diffuse abdominal pain. Nausea and vomiting. History of Crohn disease.  EXAM: CT ABDOMEN AND PELVIS WITH CONTRAST  TECHNIQUE: Multidetector CT imaging of the abdomen and pelvis was performed using the standard protocol following bolus administration of intravenous contrast.  CONTRAST:  OMNIPAQUE IOHEXOL 300 MG/ML  SOLN  COMPARISON:  [facility] CT ABDOMEN AND PELVIS WITH CONTRAST from 03/02/2012.  FINDINGS: No focal abnormality is seen in the  liver or spleen. The stomach, duodenum, pancreas, gallbladder, and adrenal glands are unremarkable. No evidence for hydronephrosis or renal mass in either kidney. Tiny low-density cortical lesions in the kidneys bilaterally are too small to characterize but are stable and likely represent tiny cysts.  No abdominal aortic aneurysm. There is no free fluid or lymphadenopathy in the abdomen.  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  The patient has dilated small bowel loops tracking down in the central anatomic pelvis were there is mesenteric edema and interloop mesenteric fluid. Small bowel is dilated up to about 2.5 cm in diameter and can be followed to a segment the displays a small bowel feces sign immediately proximal to an abrupt transition zone. The abrupt transition zone is immediately anterior to the staple line of the Hartmann's pouch. The etiology for the obstruction at this level is most likely secondary to an adhesion. There is no overt small bowel wall thickening or gross hyper enhancement. Ileum distal to the transition zone is completely decompressed.  Bone windows reveal no worrisome lytic or sclerotic osseous lesions.  IMPRESSION: Distal small bowel obstruction with an abrupt transition zone in the mid anatomic pelvis, immediately anterior to the suture line at the blind end of the Hartmann's pouch. There is some associated perienteric edema and interloop mesenteric fluid. Imaging features are likely secondary to the presence of an adhesion.  I called these results to Dr. Anitra Lauth at approximately 1334 hours on 03/14/2013.   Electronically Signed   By: Kennith Center M.D.   On: 03/14/2013 13:35   Dg Abd 2 Views  03/15/2013   CLINICAL DATA:  Small bowel obstruction. Distended abdomen. FOLLOW UP.  EXAM: ABDOMEN - 2 VIEW  COMPARISON:  03/14/2013  FINDINGS: Continued  small bowel obstruction pattern with dilated small bowel loops and air-fluid levels. Suspect no real change since prior CT. No free air or organomegaly. No suspicious calcifications. No acute bony abnormality. Lung bases are clear.  IMPRESSION: Stable small bowel obstruction pattern.   Electronically Signed   By: Charlett Nose M.D.   On: 03/15/2013 08:37    Scheduled Meds: . arformoterol  15 mcg Nebulization BID  . budesonide  0.25 mg Nebulization BID  . Chlorhexidine Gluconate Cloth  6 each Topical Q0600  . citalopram  20 mg Oral Daily  . diltiazem  180 mg Oral BID  . enoxaparin (LOVENOX) injection  40 mg Subcutaneous Q24H  . famotidine (PEPCID) IV  20 mg  Intravenous Q12H  . fluticasone  2 spray Each Nare Daily  . hydroxychloroquine  200 mg Oral BID  . insulin aspart  0-9 Units Subcutaneous TID WC  . metoprolol tartrate  12.5 mg Oral BID  . mupirocin ointment  1 application Nasal BID  . tiotropium  18 mcg Inhalation Daily   Continuous Infusions: . dextrose 5 % and 0.9% NaCl 75 mL/hr at 03/15/13 1111    Active Problems:   DIABETES, TYPE 2   CROHN'S DISEASE   Anxiety disorder   Chronic respiratory failure   SBO (small bowel obstruction)     Stephani Police, PA-C Jari Favre PA-S  [facility] Pager [phone] If 7PM-7AM, please contact night-coverage at [email or web address], password [facility] 03/15/2013, 1:19 PM  LOS: 1 day

## 2013-03-15 NOTE — Progress Notes (Signed)
Pt complaining of burning abdominal pain 9 out of 10. Pepcid IV was given at 2200 and Morphine 2mg  IV was given at 0303. On Call NP notified, no new orders given. Pt is now asleep.

## 2013-03-16 ENCOUNTER — Encounter (HOSPITAL_COMMUNITY)
Admission: EM | Disposition: A | Discharge status: Home / Self Care | Payer: Self-pay | Source: Home / Self Care | Attending: Internal Medicine

## 2013-03-16 ENCOUNTER — Encounter (HOSPITAL_COMMUNITY): Payer: Self-pay | Admitting: *Deleted

## 2013-03-16 ENCOUNTER — Encounter (HOSPITAL_COMMUNITY): Payer: Medicare Other | Admitting: Anesthesiology

## 2013-03-16 ENCOUNTER — Inpatient Hospital Stay (HOSPITAL_COMMUNITY): Payer: Medicare Other | Admitting: Anesthesiology

## 2013-03-16 ENCOUNTER — Inpatient Hospital Stay (HOSPITAL_COMMUNITY): Payer: Medicare Other

## 2013-03-16 DIAGNOSIS — F411 Generalized anxiety disorder: Secondary | ICD-10-CM

## 2013-03-16 DIAGNOSIS — K432 Incisional hernia without obstruction or gangrene: Secondary | ICD-10-CM

## 2013-03-16 DIAGNOSIS — K565 Intestinal adhesions [bands], unspecified as to partial versus complete obstruction: Secondary | ICD-10-CM

## 2013-03-16 DIAGNOSIS — IMO0002 Reserved for concepts with insufficient information to code with codable children: Secondary | ICD-10-CM

## 2013-03-16 HISTORY — PX: LAPAROTOMY: SHX154

## 2013-03-16 HISTORY — PX: VENTRAL HERNIA REPAIR: SHX424

## 2013-03-16 HISTORY — PX: INSERTION OF MESH: SHX5868

## 2013-03-16 HISTORY — PX: LYSIS OF ADHESION: SHX5961

## 2013-03-16 LAB — GLUCOSE, CAPILLARY: Glucose-Capillary: 151 mg/dL — ABNORMAL HIGH (ref 70–99)

## 2013-03-16 LAB — BASIC METABOLIC PANEL
BUN: 14 mg/dL (ref 6–23)
CO2: 25 mEq/L (ref 19–32)
Chloride: 116 mEq/L — ABNORMAL HIGH (ref 96–112)
Creatinine, Ser: 1.1 mg/dL (ref 0.50–1.10)
GFR calc Af Amer: 61 mL/min — ABNORMAL LOW (ref >90)
GFR calc non Af Amer: 52 mL/min — ABNORMAL LOW (ref >90)
Potassium: 3.5 mEq/L (ref 3.5–5.1)
Sodium: 149 mEq/L — ABNORMAL HIGH (ref 135–145)

## 2013-03-16 LAB — LACTIC ACID, PLASMA: Lactic Acid, Venous: 1 mmol/L (ref 0.5–2.2)

## 2013-03-16 LAB — HEMOGLOBIN A1C
Hgb A1c MFr Bld: 5.6 % (ref <5.7)
Mean Plasma Glucose: 114 mg/dL (ref <117)

## 2013-03-16 SURGERY — REPAIR, HERNIA, VENTRAL
Anesthesia: General | Site: Abdomen | Wound class: Clean

## 2013-03-16 MED ORDER — NEOSTIGMINE METHYLSULFATE 1 MG/ML IJ SOLN
INTRAMUSCULAR | Status: DC | PRN
  Administered 2013-03-16: 3 mg via INTRAVENOUS

## 2013-03-16 MED ORDER — DEXTROSE-NACL 5-0.9 % IV SOLN
INTRAVENOUS | Status: DC
  Administered 2013-03-16: 100 mL via INTRAVENOUS
  Administered 2013-03-18: 05:00:00 via INTRAVENOUS

## 2013-03-16 MED ORDER — OXYCODONE HCL 5 MG/5ML PO SOLN: 5.0000 mg | Freq: Once | ORAL | Status: DC | PRN

## 2013-03-16 MED ORDER — HYDROMORPHONE HCL PF 1 MG/ML IJ SOLN: 0.2500 mg | INTRAMUSCULAR | Status: DC | PRN

## 2013-03-16 MED ORDER — PHENYLEPHRINE HCL 10 MG/ML IJ SOLN
INTRAMUSCULAR | Status: DC | PRN
  Administered 2013-03-16: 80 ug via INTRAVENOUS
  Administered 2013-03-16: 40 ug via INTRAVENOUS
  Administered 2013-03-16 (×5): 80 ug via INTRAVENOUS

## 2013-03-16 MED ORDER — ROCURONIUM BROMIDE 100 MG/10ML IV SOLN
INTRAVENOUS | Status: DC | PRN
  Administered 2013-03-16: 20 mg via INTRAVENOUS
  Administered 2013-03-16: 30 mg via INTRAVENOUS

## 2013-03-16 MED ORDER — DEXAMETHASONE SODIUM PHOSPHATE 4 MG/ML IJ SOLN
INTRAMUSCULAR | Status: DC | PRN
  Administered 2013-03-16: 8 mg via INTRAVENOUS

## 2013-03-16 MED ORDER — ONDANSETRON HCL 4 MG/2ML IJ SOLN
INTRAMUSCULAR | Status: DC | PRN
  Administered 2013-03-16: 4 mg via INTRAVENOUS

## 2013-03-16 MED ORDER — LACTATED RINGERS IV SOLN
INTRAVENOUS | Status: DC | PRN
Start: 2013-03-16 — End: 2013-03-16
  Administered 2013-03-16: 11:00:00 via INTRAVENOUS

## 2013-03-16 MED ORDER — PROPOFOL 10 MG/ML IV BOLUS
INTRAVENOUS | Status: DC | PRN
  Administered 2013-03-16: 150 mg via INTRAVENOUS

## 2013-03-16 MED ORDER — LACTATED RINGERS IV SOLN
INTRAVENOUS | Status: DC | PRN
  Administered 2013-03-16: 12:00:00 via INTRAVENOUS

## 2013-03-16 MED ORDER — GLYCOPYRROLATE 0.2 MG/ML IJ SOLN
INTRAMUSCULAR | Status: DC | PRN
  Administered 2013-03-16: 0.4 mg via INTRAVENOUS

## 2013-03-16 MED ORDER — LIDOCAINE HCL (CARDIAC) 20 MG/ML IV SOLN
INTRAVENOUS | Status: DC | PRN
  Administered 2013-03-16: 100 mg via INTRAVENOUS

## 2013-03-16 MED ORDER — 0.9 % SODIUM CHLORIDE (POUR BTL) OPTIME
TOPICAL | Status: DC | PRN
  Administered 2013-03-16 (×2): 1000 mL

## 2013-03-16 MED ORDER — MORPHINE SULFATE 2 MG/ML IJ SOLN
1.0000 mg | INTRAMUSCULAR | Status: DC | PRN
  Administered 2013-03-16 – 2013-03-20 (×23): 2 mg via INTRAVENOUS
  Administered 2013-03-20: 4 mg via INTRAVENOUS
  Administered 2013-03-20 – 2013-03-22 (×11): 2 mg via INTRAVENOUS
  Filled 2013-03-16 (×22): qty 1
  Filled 2013-03-16: qty 2
  Filled 2013-03-16 (×10): qty 1
  Filled 2013-03-16: qty 2
  Filled 2013-03-16 (×2): qty 1

## 2013-03-16 MED ORDER — MIDAZOLAM HCL 5 MG/5ML IJ SOLN
INTRAMUSCULAR | Status: DC | PRN
  Administered 2013-03-16: 2 mg via INTRAVENOUS

## 2013-03-16 MED ORDER — MEPERIDINE HCL 25 MG/ML IJ SOLN: 6.2500 mg | INTRAMUSCULAR | Status: DC | PRN

## 2013-03-16 MED ORDER — PROMETHAZINE HCL 25 MG/ML IJ SOLN: 6.2500 mg | INTRAMUSCULAR | Status: DC | PRN

## 2013-03-16 MED ORDER — SUCCINYLCHOLINE CHLORIDE 20 MG/ML IJ SOLN
INTRAMUSCULAR | Status: DC | PRN
  Administered 2013-03-16: 100 mg via INTRAVENOUS

## 2013-03-16 MED ORDER — OXYCODONE HCL 5 MG PO TABS: 5.0000 mg | ORAL_TABLET | Freq: Once | ORAL | Status: DC | PRN

## 2013-03-16 MED ORDER — ACETAMINOPHEN 650 MG RE SUPP
650.0000 mg | Freq: Four times a day (QID) | RECTAL | Status: DC | PRN
  Administered 2013-03-16 – 2013-03-20 (×2): 650 mg via RECTAL
  Filled 2013-03-16 (×2): qty 1

## 2013-03-16 MED ORDER — SODIUM CHLORIDE 0.9 % IV SOLN
1.0000 g | INTRAVENOUS | Status: DC
  Administered 2013-03-16: 1 g via INTRAVENOUS
  Filled 2013-03-16: qty 1

## 2013-03-16 MED ORDER — FENTANYL CITRATE 0.05 MG/ML IJ SOLN
INTRAMUSCULAR | Status: DC | PRN
  Administered 2013-03-16: 100 ug via INTRAVENOUS
  Administered 2013-03-16 (×3): 50 ug via INTRAVENOUS

## 2013-03-16 SURGICAL SUPPLY — 56 items
BAG DECANTER FOR FLEXI CONT (MISCELLANEOUS) IMPLANT
BLADE SURG ROTATE 9660 (MISCELLANEOUS) IMPLANT
CANISTER SUCTION 2500CC (MISCELLANEOUS) ×3 IMPLANT
CHLORAPREP W/TINT 26ML (MISCELLANEOUS) ×3 IMPLANT
COVER SURGICAL LIGHT HANDLE (MISCELLANEOUS) ×3 IMPLANT
DRAPE INCISE IOBAN 66X45 STRL (DRAPES) ×3 IMPLANT
DRAPE LAPAROSCOPIC ABDOMINAL (DRAPES) ×3 IMPLANT
DRAPE UTILITY 15X26 W/TAPE STR (DRAPE) ×6 IMPLANT
DRAPE WARM FLUID 44X44 (DRAPE) ×3 IMPLANT
ELECT BLADE 6.5 EXT (BLADE) ×3 IMPLANT
ELECT CAUTERY BLADE 6.4 (BLADE) ×3 IMPLANT
ELECT REM PT RETURN 9FT ADLT (ELECTROSURGICAL) ×3
ELECTRODE REM PT RTRN 9FT ADLT (ELECTROSURGICAL) ×2 IMPLANT
GLOVE BIO SURGEON STRL SZ7.5 (GLOVE) ×3 IMPLANT
GLOVE BIOGEL PI IND STRL 7.0 (GLOVE) ×8 IMPLANT
GLOVE BIOGEL PI IND STRL 7.5 (GLOVE) ×2 IMPLANT
GLOVE BIOGEL PI IND STRL 8 (GLOVE) ×2 IMPLANT
GLOVE BIOGEL PI INDICATOR 7.0 (GLOVE) ×4
GLOVE BIOGEL PI INDICATOR 7.5 (GLOVE) ×1
GLOVE BIOGEL PI INDICATOR 8 (GLOVE) ×1
GLOVE SS BIOGEL STRL SZ 7.5 (GLOVE) ×2 IMPLANT
GLOVE SUPERSENSE BIOGEL SZ 7.5 (GLOVE) ×1
GLOVE SURG SS PI 7.0 STRL IVOR (GLOVE) ×6 IMPLANT
GOWN STRL NON-REIN LRG LVL3 (GOWN DISPOSABLE) ×9 IMPLANT
GOWN STRL REIN XL XLG (GOWN DISPOSABLE) ×3 IMPLANT
KIT BASIN OR (CUSTOM PROCEDURE TRAY) ×3 IMPLANT
KIT OSTOMY DRAINABLE 2.75 STR (WOUND CARE) ×3 IMPLANT
KIT ROOM TURNOVER OR (KITS) ×3 IMPLANT
NS IRRIG 1000ML POUR BTL (IV SOLUTION) ×3 IMPLANT
PACK GENERAL/GYN (CUSTOM PROCEDURE TRAY) ×3 IMPLANT
PAD ARMBOARD 7.5X6 YLW CONV (MISCELLANEOUS) ×6 IMPLANT
RETAINER VISCERA MED (MISCELLANEOUS) ×3 IMPLANT
SEPRAFILM PROCEDURAL PACK 3X5 (MISCELLANEOUS) ×3 IMPLANT
SPONGE GAUZE 4X4 12PLY (GAUZE/BANDAGES/DRESSINGS) IMPLANT
SPONGE LAP 18X18 X RAY DECT (DISPOSABLE) ×3 IMPLANT
STAPLER VISISTAT 35W (STAPLE) ×3 IMPLANT
SUCTION POOLE TIP (SUCTIONS) ×3 IMPLANT
SUT CHROMIC 3 0 SH 27 (SUTURE) IMPLANT
SUT NOVA 1 T20/GS 25DT (SUTURE) ×9 IMPLANT
SUT PDS AB 1 TP1 96 (SUTURE) ×6 IMPLANT
SUT PROLENE 0 CT 1 30 (SUTURE) IMPLANT
SUT PROLENE 0 CT 1 CR/8 (SUTURE) IMPLANT
SUT SILK 2 0 (SUTURE) ×1
SUT SILK 2 0 SH CR/8 (SUTURE) ×3 IMPLANT
SUT SILK 2-0 18XBRD TIE 12 (SUTURE) ×2 IMPLANT
SUT SILK 3 0 (SUTURE) ×1
SUT SILK 3 0 SH CR/8 (SUTURE) ×3 IMPLANT
SUT SILK 3-0 18XBRD TIE 12 (SUTURE) ×2 IMPLANT
SUT VIC AB 3-0 SH 27 (SUTURE)
SUT VIC AB 3-0 SH 27XBRD (SUTURE) IMPLANT
TISSUE MATRIX STRATTICE 10X10 (Tissue) ×3 IMPLANT
TOWEL OR 17X24 6PK STRL BLUE (TOWEL DISPOSABLE) IMPLANT
TOWEL OR 17X26 10 PK STRL BLUE (TOWEL DISPOSABLE) ×3 IMPLANT
TRAY FOLEY CATH 14FRSI W/METER (CATHETERS) ×3 IMPLANT
WATER STERILE IRR 1000ML POUR (IV SOLUTION) IMPLANT
YANKAUER SUCT BULB TIP NO VENT (SUCTIONS) ×3 IMPLANT

## 2013-03-16 NOTE — Progress Notes (Signed)
INITIAL NUTRITION ASSESSMENT  DOCUMENTATION CODES Per approved criteria  -Not Applicable   INTERVENTION: RD to continue to follow nutrition care plan and add interventions accordingly.  NUTRITION DIAGNOSIS: Increased nutrient needs related to post-op healing as evidenced by estimated needs.   Goal: Advance diet as tolerated. Intake to meet at least 90% of estimated needs.  Monitor:  weight trends, lab trends, I/O's, diet advancement, PO intake, supplement tolerance  Reason for Assessment: Malnutrition Screening Tool  62 y.o. female  Admitting Dx: small bowel obstruction  ASSESSMENT: PMHx significant for Crohn's Disease, COPD, s/p ileostomy and total abdominal colectomy. Admitted with abdominal pain with n/v x <1 day. Work-up reveals small bowel obstruction likely 2/2 adhesions. Plan for laparotomy today.  Pt with significant anxiety on admission, was frightened of getting NGT, per chart. Pt finally got NGT on 10/21, has had 1L output since placement. She whispers when speaking, stating that the tube is irritating her throat.  Per EPIC weights, pt with approximately 8% wt loss x 6 months. This is not significant. Per chart, pt was managing her Crohns well at home, without significant issues. She states that she follows a diabetic diet at home and does her best to drink a lot of water to stay hydrated. She doesn't take any nutritional supplements. Pt appears well-nourished.  Height: Ht Readings from Last 1 Encounters:  03/14/13 5\' 1"  (1.549 m)    Weight: Wt Readings from Last 1 Encounters:  03/14/13 120 lb (54.432 kg)    Ideal Body Weight: 105 lb  % Ideal Body Weight: 114%  Wt Readings from Last 10 Encounters:  03/14/13 120 lb (54.432 kg)  03/14/13 120 lb (54.432 kg)  03/09/13 119 lb (53.978 kg)  02/09/13 117 lb 9.6 oz (53.343 kg)  01/19/13 121 lb 9.6 oz (55.157 kg)  10/08/12 123 lb (55.792 kg)  09/03/12 128 lb (58.06 kg)  07/07/12 128 lb (58.06 kg)  05/27/12 132  lb (59.875 kg)  05/11/12 130 lb 9.6 oz (59.24 kg)    Usual Body Weight: 130 lb  % Usual Body Weight: 92%  BMI:  Body mass index is 22.69 kg/(m^2). WNL  Estimated Nutritional Needs: Kcal: 1700 - 1900 Protein: at least 81 grams daily Fluid: maintain positive balance with ostomy  Skin:  Ileostomy, otherwise intact  Diet Order: NPO  EDUCATION NEEDS: -No education needs identified at this time   Intake/Output Summary (Last 24 hours) at 03/16/13 0958 Last data filed at 03/16/13 0655  Gross per 24 hour  Intake 2313.33 ml  Output   1000 ml  Net 1313.33 ml    Last BM: PTA (no output via ostomy prior to surgery)  Labs:   Recent Labs Lab 03/14/13 0940 03/14/13 1700 03/16/13 0623  NA 141  --  149*  K 4.4  --  3.5  CL 104  --  116*  CO2 24  --  25  BUN 14  --  14  CREATININE 1.00 0.98 1.10  CALCIUM 9.5  --  8.4  GLUCOSE 90  --  146*    CBG (last 3)   Recent Labs  03/15/13 1728 03/15/13 2131 03/16/13 0853  GLUCAP 101* 119* 110*   Lab Results  Component Value Date   HGBA1C 5.9* 03/15/2013    Scheduled Meds: . arformoterol  15 mcg Nebulization BID  . budesonide  0.25 mg Nebulization BID  . Chlorhexidine Gluconate Cloth  6 each Topical Q0600  . citalopram  20 mg Oral Daily  . diltiazem  180 mg  Oral BID  . ertapenem  1 g Intravenous Q24H  . famotidine (PEPCID) IV  20 mg Intravenous Q12H  . fluticasone  2 spray Each Nare Daily  . hydroxychloroquine  200 mg Oral BID  . insulin aspart  0-9 Units Subcutaneous TID WC  . metoprolol tartrate  12.5 mg Oral BID  . mupirocin ointment  1 application Nasal BID  . tiotropium  18 mcg Inhalation Daily    Continuous Infusions: . dextrose 5 % and 0.9% NaCl 75 mL/hr at 03/15/13 1111    Past Medical History  Diagnosis Date  . Other diseases of vocal cords   . Osteoporosis, unspecified   . Other and unspecified hyperlipidemia   . Esophageal reflux   . Chronic airway obstruction, not elsewhere classified   .  Regional enteritis of unspecified site   . 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 Type II or unspecified type diabetes mellitus without mention of complication, not stated as uncontrolled   . Crohn's disease     Ileostomy  . Pelvic adhesions   . Anxiety   . Depression   . Hypertension   . Shortness of breath   . Pneumonia   . Asthma   . Emphysema   . Hematuria   . Rheumatoid arthritis(714.0) 03/2012    [place]  . Severe cervical dysplasia age 37    cone biopsy with neg paps since    Past Surgical History  Procedure Laterality Date  . Ileostomy    . Vaginal hysterectomy  1993    [facility]  . Ear cyst excision    . Cervical cone biopsy  1978  . Colon surgery  2007    subtotal colectomy with ileostomy  . Esophagus stretch    . Oophorectomy      [facility]  . Tracheostomy  2010  . Hammer toe surgery  1980's    Bilateral Feet  . Balloon dilation  02/10/2012    Procedure: BALLOON DILATION;  Surgeon: Charolett Bumpers, MD;  Location: [facility];  Service: Endoscopy;  Laterality: N/A;  . Colposcopy    . Flexible sigmoidoscopy  03/26/2012    Procedure: FLEXIBLE SIGMOIDOSCOPY;  Surgeon: Charolett Bumpers, MD;  Location: [facility];  Service: Endoscopy;  Laterality: N/A;  . Cardiac catheterization  07/08/2007    normal  . Nm myocar perf wall motion  09/21/2006    no ischemia    Jarold Motto MS, RD, LDN Pager: [phone] After-hours pager: [phone]

## 2013-03-16 NOTE — Progress Notes (Addendum)
[facility] PROGRESS NOTE Subjective NG tube in place   Objective: Vital signs in last 24 hours: Temp:  [100.1 F (37.8 C)-101.2 F (38.4 C)] 101.2 F (38.4 C) (10/22 0503) Pulse Rate:  [108-116] 108 (10/22 0503) Resp:  [16-18] 16 (10/22 0503) BP: (97-119)/(64-79) 97/64 mmHg (10/22 0503) SpO2:  [91 %-100 %] 91 % (10/22 0503) Last BM Date: 03/15/13  Intake/Output from previous day: 10/21 0701 - 10/22 0700 In: 2313.3 [I.V.:2313.3] Out: 1000 [Emesis/NG output:1000] Intake/Output this shift:    PE: General--NAD, NG in place  Heart-- Lungs-- Abdomen--distended tender  Lab Results:  Recent Labs  03/14/13 0940 03/14/13 1700  WBC 9.3 8.6  HGB 11.6* 10.0*  HCT 35.0* 30.3*  PLT 223 190   BMET  Recent Labs  03/14/13 0940 03/14/13 1700  NA 141  --   K 4.4  --   CL 104  --   CO2 24  --   CREATININE 1.00 0.98   LFT  Recent Labs  03/14/13 0940  PROT 7.6  AST 25  ALT 13  ALKPHOS 76  BILITOT 0.4   PT/INR No results found for this basename: LABPROT, INR,  in the last 72 hours PANCREAS  Recent Labs  03/14/13 0940  LIPASE 23         Studies/Results: Ct Abdomen Pelvis W Contrast  03/14/2013   CLINICAL DATA:  Diffuse abdominal pain. Nausea and vomiting. History of Crohn disease.  EXAM: CT ABDOMEN AND PELVIS WITH CONTRAST  TECHNIQUE: Multidetector CT imaging of the abdomen and pelvis was performed using the standard protocol following bolus administration of intravenous contrast.  CONTRAST:  OMNIPAQUE IOHEXOL 300 MG/ML  SOLN  COMPARISON:  [facility] CT ABDOMEN AND PELVIS WITH CONTRAST from 03/02/2012.  FINDINGS: No focal abnormality is seen in the liver or spleen. The stomach, duodenum, pancreas, gallbladder, and adrenal glands are unremarkable. No evidence for hydronephrosis or renal mass in either kidney. Tiny low-density cortical lesions in the kidneys bilaterally are too small to characterize but are stable and likely  represent tiny cysts.  No abdominal aortic aneurysm. There is no free fluid or lymphadenopathy in the abdomen.  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  The patient has dilated small bowel loops tracking down in the central anatomic pelvis were there is mesenteric edema and interloop mesenteric fluid. Small bowel is dilated up to about 2.5 cm in diameter and can be followed to a segment the displays a small bowel feces sign immediately proximal to an abrupt transition zone. The abrupt transition zone is immediately anterior to the staple line of the Hartmann's pouch. The etiology for the obstruction at this level is most likely secondary to an adhesion. There is no overt small bowel wall thickening or gross hyper enhancement. Ileum distal to the transition zone is completely decompressed.  Bone windows reveal no worrisome lytic or sclerotic osseous lesions.  IMPRESSION: Distal small bowel obstruction with an abrupt transition zone in the mid anatomic pelvis, immediately anterior to the suture line at the blind end of the Hartmann's pouch. There is some associated perienteric edema and interloop mesenteric fluid. Imaging features are likely secondary to the presence of an adhesion.  I called these results to Dr. Anitra Lauth at approximately 1334 hours on 03/14/2013.   Electronically Signed   By: Kennith Center M.D.   On: 03/14/2013 13:35   Dg Chest  Port 1 View  03/15/2013   CLINICAL DATA:  Pre-op baseline. Assess airspace disease. History of diabetes and Crohn's disease.  EXAM: PORTABLE CHEST - 1 VIEW  COMPARISON:  Chest radiographs 12/01/2011. CT chest 12/01/2011 and abdomen 03/14/2013.  FINDINGS: 1704 hr. Nasogastric tube projects below the diaphragm. There is mild atelectasis at both lung bases. No confluent airspace opacity or pleural effusion is seen. There is linear right  perihilar scarring which is unchanged. The heart size and mediastinal contours are stable. There is fragmentation and collapse of the right humeral head consistent with chronic avascular necrosis.  IMPRESSION: Mild bibasilar atelectasis. No consolidation or other significant changes identified.   Electronically Signed   By: Roxy Horseman M.D.   On: 03/15/2013 17:50   Dg Abd 2 Views  03/15/2013   CLINICAL DATA:  Small bowel obstruction. Distended abdomen. FOLLOW UP.  EXAM: ABDOMEN - 2 VIEW  COMPARISON:  03/14/2013  FINDINGS: Continued small bowel obstruction pattern with dilated small bowel loops and air-fluid levels. Suspect no real change since prior CT. No free air or organomegaly. No suspicious calcifications. No acute bony abnormality. Lung bases are clear.  IMPRESSION: Stable small bowel obstruction pattern.   Electronically Signed   By: Charlett Nose M.D.   On: 03/15/2013 08:37    Medications: I have reviewed the patient's current medications.  Assessment/Plan 1. SBO probably due to adhesions will likely need surgery   [name],[name] 03/16/2013, 7:21 AM

## 2013-03-16 NOTE — Progress Notes (Signed)
[facility] PROGRESS NOTE  [name] JXB:[id number] DOB: 1950-10-08 DOA: 03/14/2013 PCP: [name], MD  Assessment/Plan: 62 yo female with a history of Crohn's status post colostomy, COPD, DM, HTN who presented to the ED on 10/20 with abdominal pain and was found to have a SBO on CT scan.   SBO  -etiology likely Adhesions vs. Crohn's  -Surgical consult following-laparotomy today for SBO -In interim c/w IVF, Supportive care and she is NPO. Has a NG tube placed.  -Monitor potassium  -Minimize narcotics and anticholingerics.   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 -Will need very aggressive pulmonary hygienes post extubation as with abdominal procedure and obstructive lung disease patients will easily atelectasis and subsequent PNA. -have asked CCS team to have patient moved to either ICU or SDU post op  Crohn's  S/P total colectomy.  Cared for by [facility].   Diabetes  -A1C 5.9 -On Victoza at home -currently on hold -She is on sliding scale sensitive as she is NPO -CBG's stable  HTN  -Well controlled on Cardizem and Metoprolol -but now on hold as NPO-will re-evaluate post-op and see if needs IV anti-hypertensives  Anxiety  -Not well controlled-but better than last few days -On ativan and celexa   Code Status: full code Family Communication: Disposition Plan: inpatient   Consultants:  Surgical  GI  Pulmonary  Procedures:  NG tube placement  Antibiotics:    HPI/Subjective: Repeatedly asking for pain medicine and complaining about pain. Says pain is an 8 out of 10 compared to a 10 out of 10 yesterday.   Objective: Filed Vitals:   03/16/13 0503  BP: 97/64  Pulse: 108  Temp: 101.2 F (38.4 C)  Resp: 16    Intake/Output Summary (Last 24 hours) at 03/16/13 0953 Last data filed at 03/16/13 0655  Gross per 24 hour  Intake  2313.33 ml  Output   1000 ml  Net 1313.33 ml   Filed Weights   03/14/13 0930 03/14/13 1651  Weight: 53.978 kg (119 lb) 54.432 kg (120 lb)    Exam: General: In moderate distress, awake, alert, and oriented. She was lying in bed. Cardiovascular: RRR, no rubs, murmurs, or gallops  Respiratory: clear bilaterally in all fields  Abdomen: Soft, disproportionally Tender, + bowel sounds, umbilical hernia that was reducible. Musculoskeletal: No swelling in extremities. She could move all 4 extremities.  Psych: She is alert and oriented she appears to be in some discomfort and generally cooperative.    Data Reviewed: Basic Metabolic Panel:  Recent Labs Lab 03/14/13 0940 03/14/13 1700 03/16/13 0623  NA 141  --  149*  K 4.4  --  3.5  CL 104  --  116*  CO2 24  --  25  GLUCOSE 90  --  146*  BUN 14  --  14  CREATININE 1.00 0.98 1.10  CALCIUM 9.5  --  8.4   Liver Function Tests:  Recent Labs Lab 03/14/13 0940  AST 25  ALT 13  ALKPHOS 76  BILITOT 0.4  PROT 7.6  ALBUMIN 3.8    Recent Labs Lab 03/14/13 0940  LIPASE 23   No results found for this basename: AMMONIA,  in the last 168 hours CBC:  Recent Labs Lab 03/14/13 0940 03/14/13 1700  WBC 9.3 8.6  NEUTROABS 7.9*  --   HGB 11.6* 10.0*  HCT 35.0* 30.3*  MCV 84.3 85.1  PLT 223 190   Cardiac Enzymes: No results found for this basename: CKTOTAL, CKMB, CKMBINDEX, TROPONINI,  in the last 168 hours BNP (last 3 results) No results found for this basename: PROBNP,  in the last 8760 hours CBG:  Recent Labs Lab 03/15/13 0749 03/15/13 1207 03/15/13 1728 03/15/13 2131 03/16/13 0853  GLUCAP 124* 93 101* 119* 110*    Recent Results (from the past 240 hour(s))  MRSA PCR SCREENING     Status: Abnormal   Collection Time    03/14/13  9:45 PM      Result Value Range Status   MRSA by PCR POSITIVE (*) NEGATIVE Final   Comment:            The GeneXpert MRSA Assay (FDA     approved for NASAL specimens     only), is  one component of a     comprehensive MRSA colonization     surveillance program. It is not     intended to diagnose MRSA     infection nor to guide or     monitor treatment for     MRSA infections.     RESULT CALLED TO, READ BACK BY AND VERIFIED WITH:     CALLED TO RN ARIEL [facility] [id number] @0041  [name]     Studies: Ct Abdomen Pelvis W Contrast  03/14/2013   CLINICAL DATA:  Diffuse abdominal pain. Nausea and vomiting. History of Crohn disease.  EXAM: CT ABDOMEN AND PELVIS WITH CONTRAST  TECHNIQUE: Multidetector CT imaging of the abdomen and pelvis was performed using the standard protocol following bolus administration of intravenous contrast.  CONTRAST:  OMNIPAQUE IOHEXOL 300 MG/ML  SOLN  COMPARISON:  [facility] CT ABDOMEN AND PELVIS WITH CONTRAST from 03/02/2012.  FINDINGS: No focal abnormality is seen in the liver or spleen. The stomach, duodenum, pancreas, gallbladder, and adrenal glands are unremarkable. No evidence for hydronephrosis or renal mass in either kidney. Tiny low-density cortical lesions in the kidneys bilaterally are too small to characterize but are stable and likely represent tiny cysts.  No abdominal aortic aneurysm. There is no free fluid or lymphadenopathy in the abdomen.  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  The patient has dilated small bowel loops tracking down in the central anatomic pelvis were there is mesenteric edema and interloop mesenteric fluid. Small bowel is dilated up to about 2.5 cm in diameter and can be followed to a segment the displays a small bowel feces sign immediately proximal to an abrupt transition zone. The abrupt transition zone is immediately anterior to the staple line of the Hartmann's pouch. The etiology for the obstruction at this level is most likely secondary to an adhesion. There is no  overt small bowel wall thickening or gross hyper enhancement. Ileum distal to the transition zone is completely decompressed.  Bone windows reveal no worrisome lytic or sclerotic osseous lesions.  IMPRESSION: Distal small bowel obstruction with an abrupt transition zone in the mid anatomic pelvis, immediately anterior to the suture line at the blind end of the Hartmann's pouch. There is some associated perienteric edema and interloop mesenteric fluid. Imaging features are likely secondary to the presence of an adhesion.  I called these results to Dr. Anitra Lauth at approximately 1334 hours on 03/14/2013.   Electronically Signed   By: Kennith Center M.D.   On: 03/14/2013 13:35   Dg  Chest Port 1 View  03/15/2013   CLINICAL DATA:  Pre-op baseline. Assess airspace disease. History of diabetes and Crohn's disease.  EXAM: PORTABLE CHEST - 1 VIEW  COMPARISON:  Chest radiographs 12/01/2011. CT chest 12/01/2011 and abdomen 03/14/2013.  FINDINGS: 1704 hr. Nasogastric tube projects below the diaphragm. There is mild atelectasis at both lung bases. No confluent airspace opacity or pleural effusion is seen. There is linear right perihilar scarring which is unchanged. The heart size and mediastinal contours are stable. There is fragmentation and collapse of the right humeral head consistent with chronic avascular necrosis.  IMPRESSION: Mild bibasilar atelectasis. No consolidation or other significant changes identified.   Electronically Signed   By: Roxy Horseman M.D.   On: 03/15/2013 17:50   Dg Abd 2 Views  03/16/2013   CLINICAL DATA:  Small-bowel obstruction  EXAM: ABDOMEN - 2 VIEW  COMPARISON:  03/15/2013  FINDINGS: A nasogastric catheter is now noted within the stomach. There are embolization coils identified stable from the prior exam. Multiple dilated loops of small bowel are again identified. The overall appearance is slightly worse than that seen on the prior exam. This may be related in part due to more air as opposed  of fluid within the bowel. Continued followup is recommended. No free air is seen. An ostomy is noted in the right lower quadrant.  IMPRESSION: Increase in the degree of gaseous distension although this may be related to some changes in the fluid dynamics within the small bowel. No free air is seen.   Electronically Signed   By: Alcide Clever M.D.   On: 03/16/2013 08:24   Dg Abd 2 Views  03/15/2013   CLINICAL DATA:  Small bowel obstruction. Distended abdomen. FOLLOW UP.  EXAM: ABDOMEN - 2 VIEW  COMPARISON:  03/14/2013  FINDINGS: Continued small bowel obstruction pattern with dilated small bowel loops and air-fluid levels. Suspect no real change since prior CT. No free air or organomegaly. No suspicious calcifications. No acute bony abnormality. Lung bases are clear.  IMPRESSION: Stable small bowel obstruction pattern.   Electronically Signed   By: Charlett Nose M.D.   On: 03/15/2013 08:37    Scheduled Meds: . arformoterol  15 mcg Nebulization BID  . budesonide  0.25 mg Nebulization BID  . Chlorhexidine Gluconate Cloth  6 each Topical Q0600  . citalopram  20 mg Oral Daily  . diltiazem  180 mg Oral BID  . ertapenem  1 g Intravenous Q24H  . famotidine (PEPCID) IV  20 mg Intravenous Q12H  . fluticasone  2 spray Each Nare Daily  . hydroxychloroquine  200 mg Oral BID  . insulin aspart  0-9 Units Subcutaneous TID WC  . metoprolol tartrate  12.5 mg Oral BID  . mupirocin ointment  1 application Nasal BID  . tiotropium  18 mcg Inhalation Daily   Continuous Infusions: . dextrose 5 % and 0.9% NaCl 75 mL/hr at 03/15/13 1111    Active Problems:   DIABETES, TYPE 2   CROHN'S DISEASE   Anxiety disorder   Chronic respiratory failure   SBO (small bowel obstruction)   Hypoxemia   COPD exacerbation      Jari Favre PA-S [facility] Pager [phone] 7PM-7AM, please contact night-coverage at [email or web address], password [facility] 03/16/2013, 9:53 AM  LOS: 2 days    Attending Patient see and examined,  agree with the assessment and plan as outlined above. Still no output in her ileostomy. NG tube finally inserted yesterday. Seen by CCS and for  lapartomy today. Will likely need to be monitored in the SDU or ICU post-op, CCS aware.  Windell Norfolk MD

## 2013-03-16 NOTE — Transfer of Care (Signed)
Immediate Anesthesia Transfer of Care Note  Patient: [name]  Procedure(s) Performed: Procedure(s): REPAIR OF INCISIONAL AND PARASTOMAL HERNIAS (N/A) EXPLORATORY LAPAROTOMY (N/A) LYSIS OF ADHESIONS FOR SMALL BOWEL OBSTRUCTION (N/A) INSERTION OF BIOLOGIC MESH (N/A)  Patient Location: PACU  Anesthesia Type:General  Level of Consciousness: patient cooperative and responds to stimulation  Airway & Oxygen Therapy: Patient Spontanous Breathing and Patient connected to face mask oxygen  Post-op Assessment: Report given to PACU RN and Post -op Vital signs reviewed and stable  Post vital signs: Reviewed and stable  Complications: No apparent anesthesia complications

## 2013-03-16 NOTE — Progress Notes (Signed)
Patient ID: [name], female   DOB: 10-30-1950, 62 y.o.   MRN: [id number]    Subjective: Abdominal pressure better with NG but still with central abdominal pain.  Nothing out ileostomy  Objective: Vital signs in last 24 hours: Temp:  [100.1 F (37.8 C)-101.2 F (38.4 C)] 101.2 F (38.4 C) (10/22 0503) Pulse Rate:  [108-116] 108 (10/22 0503) Resp:  [16-18] 16 (10/22 0503) BP: (97-119)/(64-79) 97/64 mmHg (10/22 0503) SpO2:  [91 %-100 %] 91 % (10/22 0503) Last BM Date: 03/15/13  Intake/Output from previous day: 10/21 0701 - 10/22 0700 In: 2313.3 [I.V.:2313.3] Out: 1000 [Emesis/NG output:1000] Intake/Output this shift:    General appearance: alert, cooperative and mild distress Resp: No wheezing or increased WOB GI: abnormal findings:  distended and moderate tenderness in the entire abdomen and no change from previous exams  Lab Results:   Recent Labs  03/14/13 0940 03/14/13 1700  WBC 9.3 8.6  HGB 11.6* 10.0*  HCT 35.0* 30.3*  PLT 223 190   BMET  Recent Labs  03/14/13 0940 03/14/13 1700 03/16/13 0623  NA 141  --  149*  K 4.4  --  3.5  CL 104  --  116*  CO2 24  --  25  GLUCOSE 90  --  146*  BUN 14  --  14  CREATININE 1.00 0.98 1.10  CALCIUM 9.5  --  8.4     Studies/Results: Ct Abdomen Pelvis W Contrast  03/14/2013   CLINICAL DATA:  Diffuse abdominal pain. Nausea and vomiting. History of Crohn disease.  EXAM: CT ABDOMEN AND PELVIS WITH CONTRAST  TECHNIQUE: Multidetector CT imaging of the abdomen and pelvis was performed using the standard protocol following bolus administration of intravenous contrast.  CONTRAST:  OMNIPAQUE IOHEXOL 300 MG/ML  SOLN  COMPARISON:  [facility] CT ABDOMEN AND PELVIS WITH CONTRAST from 03/02/2012.  FINDINGS: No focal abnormality is seen in the liver or spleen. The stomach, duodenum, pancreas, gallbladder, and adrenal glands are unremarkable. No evidence for hydronephrosis or renal mass in either kidney.  Tiny low-density cortical lesions in the kidneys bilaterally are too small to characterize but are stable and likely represent tiny cysts.  No abdominal aortic aneurysm. There is no free fluid or lymphadenopathy in the abdomen.  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  The patient has dilated small bowel loops tracking down in the central anatomic pelvis were there is mesenteric edema and interloop mesenteric fluid. Small bowel is dilated up to about 2.5 cm in diameter and can be followed to a segment the displays a small bowel feces sign immediately proximal to an abrupt transition zone. The abrupt transition zone is immediately anterior to the staple line of the Hartmann's pouch. The etiology for the obstruction at this level is most likely secondary to an adhesion. There is no overt small bowel wall thickening or gross hyper enhancement. Ileum distal to the transition zone is completely decompressed.  Bone windows reveal no worrisome lytic or sclerotic osseous lesions.  IMPRESSION: Distal small bowel obstruction with an abrupt transition zone in the mid anatomic pelvis, immediately anterior to the suture line at the blind end of the Hartmann's pouch. There is some associated perienteric edema and interloop mesenteric fluid. Imaging features are likely secondary to the presence of an adhesion.  I called these results to Dr. Anitra Lauth at approximately 1334 hours on 03/14/2013.  Electronically Signed   By: Kennith Center M.D.   On: 03/14/2013 13:35   Dg Chest Port 1 View  03/15/2013   CLINICAL DATA:  Pre-op baseline. Assess airspace disease. History of diabetes and Crohn's disease.  EXAM: PORTABLE CHEST - 1 VIEW  COMPARISON:  Chest radiographs 12/01/2011. CT chest 12/01/2011 and abdomen 03/14/2013.  FINDINGS: 1704 hr. Nasogastric tube projects below the diaphragm. There is mild  atelectasis at both lung bases. No confluent airspace opacity or pleural effusion is seen. There is linear right perihilar scarring which is unchanged. The heart size and mediastinal contours are stable. There is fragmentation and collapse of the right humeral head consistent with chronic avascular necrosis.  IMPRESSION: Mild bibasilar atelectasis. No consolidation or other significant changes identified.   Electronically Signed   By: Roxy Horseman M.D.   On: 03/15/2013 17:50   Dg Abd 2 Views  03/16/2013   CLINICAL DATA:  Small-bowel obstruction  EXAM: ABDOMEN - 2 VIEW  COMPARISON:  03/15/2013  FINDINGS: A nasogastric catheter is now noted within the stomach. There are embolization coils identified stable from the prior exam. Multiple dilated loops of small bowel are again identified. The overall appearance is slightly worse than that seen on the prior exam. This may be related in part due to more air as opposed of fluid within the bowel. Continued followup is recommended. No free air is seen. An ostomy is noted in the right lower quadrant.  IMPRESSION: Increase in the degree of gaseous distension although this may be related to some changes in the fluid dynamics within the small bowel. No free air is seen.   Electronically Signed   By: Alcide Clever M.D.   On: 03/16/2013 08:24   Dg Abd 2 Views  03/15/2013   CLINICAL DATA:  Small bowel obstruction. Distended abdomen. FOLLOW UP.  EXAM: ABDOMEN - 2 VIEW  COMPARISON:  03/14/2013  FINDINGS: Continued small bowel obstruction pattern with dilated small bowel loops and air-fluid levels. Suspect no real change since prior CT. No free air or organomegaly. No suspicious calcifications. No acute bony abnormality. Lung bases are clear.  IMPRESSION: Stable small bowel obstruction pattern.   Electronically Signed   By: Charlett Nose M.D.   On: 03/15/2013 08:37    Anti-infectives: Anti-infectives   Start     Dose/Rate Route Frequency Ordered Stop   03/14/13 2200   hydroxychloroquine (PLAQUENIL) tablet 200 mg     200 mg Oral 2 times daily 03/14/13 1624        Assessment/Plan: Persistent SBO, no evidence of improvement.  Have reccommended proceeding with laparotomy.  Pt understands and is in agreement    LOS: 2 days    Danette Weinfeld T 03/16/2013

## 2013-03-16 NOTE — Op Note (Signed)
Preoperative Diagnosis: SBO (small bowel obstruction) [560.9] Crohn's colitis [555.1] parastomal and incisional hernias  Postoprative Diagnosis: SBO (small bowel obstruction) [560.9] Crohn's colitis [555.1] her stomal and incisional hernias  Procedure: Procedure(s): REPAIR OF INCISIONAL AND PARASTOMAL HERNIAS EXPLORATORY LAPAROTOMY LYSIS OF ADHESIONS FOR SMALL BOWEL OBSTRUCTION INSERTION OF BIOLOGIC MESH   Surgeon: Glenna Fellows T   Assistants: none  Anesthesia:  General endotracheal anesthesia  Indications: patient is a 62 year old female with a history of Crohn's colitis, status post total abdominal colectomy and end ileostomy at [facility] in 2007. She has known parastomal and ventral incisional hernias. She now presents with acute small bowel obstruction with a transition point in the pelvis. She has failed to improve with 48 hours of conservative management and we have recommended proceeding with exploratory laparotomy and repair of her hernias at the same time. We've extensively discussed the indications for the procedure and significant risk of pulmonary complications do to advanced COPD as well as risks of general anesthesia, bleeding, infection, visceral injury and recurrent hernia or bowel obstruction. She understands and agrees to proceed.    Procedure Detail: patient was brought to the operating room, placed in the supine position on the operating table, and general endotracheal anesthesia induced. She received preoperative IV antibiotics. She was on subcutaneous heparin. PAS were placed. The abdomen was widely sterilely prepped. He had was draped using an Ioban drape to exclude the ileostomy. The previous midline incision was used and extended up a few centimeters higher. Dissection was carried down to the saphenous tissue. The fascia in the lower abdomen was carefully incised and the peritoneum entered under correct vision. There was some clear peritoneal fluid. The fascial incision  was carefully extended superiorly into the midline ventral hernia which was at the same level as the stoma. The skin and subcutaneous hernia sac was incised in the midline and then the fascial incision carried more superiorly. There were extensive adhesions but they were relatively soft and not unusually difficult. The entire anterior abdominal wall was initially cleared of adhesions in the ileostomy isolated. There was a moderate sized probably 3-4 cm peristomal hernia. Following this small bowel loops were mobilized up out of the pelvis with careful sharp dissection. There were some dense adhesions along the lateral pelvic sidewalls but particularly dense adhesion to the end of the rectal stump staple line. As I came across this there was a small what appeared to be mucocele associated with the rectal stump I did not see a communication into the stomach. The small bowel dissected up off of this intact without any evidence of fistula and this clearly was the point of obstruction was completely decompressed small bowel distal to this. Following this all interloop adhesions were completely lysed from the ligament of Treitz to the ileostomy. There was no evidence of injury to the small intestine and it appeared intrinsically normal. The abdomen was thoroughly irrigated and hemostasis assured. I initially placed a piece of Seprafilm in the pelvis over the rectal stump to try to prevent further adhesions and the viscera returned to their anatomic position. I chose to use a piece of stratus biologic material 10 x 10 cm. A slit and keyhole were placed on one side of this to go around the ileostomy. The stratus was then sutured with interrupted 0 Novafil around the stoma and tack the tails out into the right lateral abdominal wall. Prior to this I had closed the stomal defect primarily with 2 interrupted 0 Novafil sutures closing it down to  a fingertip around the stoma. The other side of the mesh was brought across the  midline at the site of the midline incisional hernia and tacked to the peritoneum circumferentially on the left side of the abdomen. The fascia at the midline hernia defect was mobilized from the subcutaneous tissue somewhat to allow closure under no tension. The midline fascia was then closed with looped #1 PDS begun at either end of the incision along with interrupted 0 Novafil sutures. The subcutaneous tissue was irrigated. The skin was closed with staples. Sponge needle and instrument counts were correct.    Findings: As above  Estimated Blood Loss:  200 mL         Drains: none  Blood Given: none          Specimens: none        Complications:  * No complications entered in OR log *         Disposition: PACU - hemodynamically stable.         Condition: stable

## 2013-03-16 NOTE — Anesthesia Preprocedure Evaluation (Addendum)
Anesthesia Evaluation  Patient identified by MRN, date of birth, ID band Patient awake    Reviewed: Allergy & Precautions, H&P , NPO status , Patient's Chart, lab work & pertinent test results, reviewed documented beta blocker date and time   Airway Mallampati: II TM Distance: >3 FB Neck ROM: Full   Comment: H/O prior tracheostomy. Dental no notable dental hx. (+) Dental Advisory Given   Pulmonary shortness of breath, asthma , pneumonia -, COPD COPD inhaler and oxygen dependent,  Oxygen at 1.5 to 2 L/min 24 hours a day. She reports she took all her regular pulmonary medicines today. She sees Dr. [name] every 2 months and is scheduled to see him next week. breath sounds clear to auscultation  Pulmonary exam normal       Cardiovascular hypertension, Pt. on home beta blockers and Pt. on medications + dysrhythmias Rhythm:Regular Rate:Normal  Cath 2009: normal coronary arteries., LVF: hyperdynamic 2010   Neuro/Psych PSYCHIATRIC DISORDERS Anxiety Depression negative neurological ROS     GI/Hepatic Neg liver ROS, GERD-  Medicated,  Endo/Other  diabetes, Type 2, Oral Hypoglycemic Agents  Renal/GU negative Renal ROS     Musculoskeletal  (+) Arthritis -,   Abdominal   Peds  Hematology negative hematology ROS (+)   Anesthesia Other Findings   Reproductive/Obstetrics negative OB ROS                          Anesthesia Physical  Anesthesia Plan  ASA: III and emergent  Anesthesia Plan: General   Post-op Pain Management:    Induction: Intravenous  Airway Management Planned: Oral ETT  Additional Equipment:   Intra-op Plan:   Post-operative Plan: Extubation in OR  Informed Consent: I have reviewed the patients History and Physical, chart, labs and discussed the procedure including the risks, benefits and alternatives for the proposed anesthesia with the patient or authorized representative who has  indicated his/her understanding and acceptance.   Dental advisory given  Plan Discussed with: CRNA  Anesthesia Plan Comments:       Anesthesia Quick Evaluation

## 2013-03-16 NOTE — Preoperative (Signed)
Beta Blockers   Reason not to administer Beta Blockers:Not Applicable, given last PM

## 2013-03-16 NOTE — Anesthesia Postprocedure Evaluation (Signed)
Anesthesia Post Note  Patient: [name]  Procedure(s) Performed: Procedure(s) (LRB): REPAIR OF INCISIONAL AND PARASTOMAL HERNIAS (N/A) EXPLORATORY LAPAROTOMY (N/A) LYSIS OF ADHESIONS FOR SMALL BOWEL OBSTRUCTION (N/A) INSERTION OF BIOLOGIC MESH (N/A)  Anesthesia type: General  Patient location: PACU  Post pain: Pain level controlled  Post assessment: Post-op Vital signs reviewed  Last Vitals: BP 105/48  Pulse 87  Temp(Src) 36.8 C (Axillary)  Resp 19  Ht 5\' 1"  (1.549 m)  Wt 144 lb 10 oz (65.6 kg)  BMI 27.34 kg/m2  SpO2 91%  Post vital signs: Reviewed  Level of consciousness: sedated  Complications: No apparent anesthesia complications

## 2013-03-17 ENCOUNTER — Encounter (HOSPITAL_COMMUNITY): Payer: Self-pay | Admitting: General Surgery

## 2013-03-17 DIAGNOSIS — K5010 Crohn's disease of large intestine without complications: Secondary | ICD-10-CM

## 2013-03-17 LAB — CBC
HCT: 26.1 % — ABNORMAL LOW (ref 36.0–46.0)
Hemoglobin: 8.5 g/dL — ABNORMAL LOW (ref 12.0–15.0)
MCH: 28.5 pg (ref 26.0–34.0)
MCHC: 32.6 g/dL (ref 30.0–36.0)
MCV: 87.6 fL (ref 78.0–100.0)
Platelets: 141 10*3/uL — ABNORMAL LOW (ref 150–400)
WBC: 8.9 10*3/uL (ref 4.0–10.5)

## 2013-03-17 LAB — BASIC METABOLIC PANEL
BUN: 12 mg/dL (ref 6–23)
Calcium: 7.7 mg/dL — ABNORMAL LOW (ref 8.4–10.5)
Creatinine, Ser: 0.89 mg/dL (ref 0.50–1.10)
GFR calc non Af Amer: 68 mL/min — ABNORMAL LOW (ref >90)
Glucose, Bld: 170 mg/dL — ABNORMAL HIGH (ref 70–99)

## 2013-03-17 LAB — GLUCOSE, CAPILLARY
Glucose-Capillary: 143 mg/dL — ABNORMAL HIGH (ref 70–99)
Glucose-Capillary: 139 mg/dL — ABNORMAL HIGH (ref 70–99)
Glucose-Capillary: 103 mg/dL — ABNORMAL HIGH (ref 70–99)

## 2013-03-17 NOTE — Progress Notes (Signed)
[facility] PROGRESS NOTE  [name] ZHY:[id number] DOB: 04-22-1951 DOA: 03/14/2013 PCP: [name], MD  Assessment/Plan: 62 yo female with a history of Crohn's status post colostomy, COPD, DM, HTN who presented to the ED on 10/20 with abdominal pain and was found to have a SBO on CT scan.   SBO  -etiology likely Adhesions vs. Crohn's  -Post-op-laparotomy for SBO - laparotomy done on 10/22.  - Stable NG tube in place, we will defer further postop issues to the surgical team.  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 Appreciate pulmonary consultation.  Crohn's  -S/P total colectomy.  -Cared for by [facility].  - Stable  Diabetes  -A1C 5.6 yesterday  -On Victoza at home -currently on hold  -She is on sliding scale sensitive as she is NPO -CBG's stable   HTN  -Well controlled on Cardizem and Metoprolol-hold antihypertensives as her BP is stable (103/62) - we'll resume as needed  Anxiety  -Not well controlled-but better than last few days  -On ativan and celexa   Code Status: full code  Family Communication:  Disposition Plan: SDU  Consultants:  GI  Pulmonary Surgery  Procedures:  Exploratory lap Bowel adhesion lysis Umbilical hernia repair   Antibiotics: none  HPI/Subjective:  Seems to be doing well post op. She is very thirsty and her pain is better.   Objective: Filed Vitals:   03/17/13 0405  BP: 103/62  Pulse: 92  Temp: 98.7 F (37.1 C)  Resp: 13    Intake/Output Summary (Last 24 hours) at 03/17/13 0956 Last data filed at 03/17/13 0600  Gross per 24 hour  Intake   3400 ml  Output   1830 ml  Net   1570 ml   Filed Weights   03/14/13 0930 03/14/13 1651 03/16/13 1542  Weight: 53.978 kg (119 lb) 54.432 kg (120 lb) 65.6 kg (144 lb 10 oz)    Exam:  General:  Well developed, well nourished,  female. Cardiovascular: RRR, no rubs, murmurs, or gallops Respiratory: clear bilaterally in all fields Abdomen: soft, non-tender, - bowel sounds Musculoskeletal: no edema bilateral lower extremities w/o change, no cyanosis or clubbing  Data Reviewed: Basic Metabolic Panel:  Recent Labs Lab 03/14/13 0940 03/14/13 1700 03/16/13 0623 03/17/13 0612  NA 141  --  149* 148*  K 4.4  --  3.5 3.7  CL 104  --  116* 116*  CO2 24  --  25 22  GLUCOSE 90  --  146* 170*  BUN 14  --  14 12  CREATININE 1.00 0.98 1.10 0.89  CALCIUM 9.5  --  8.4 7.7*   Liver Function Tests:  Recent Labs Lab 03/14/13 0940  AST 25  ALT 13  ALKPHOS 76  BILITOT 0.4  PROT 7.6  ALBUMIN 3.8    Recent Labs Lab 03/14/13 0940  LIPASE 23   No results found for this basename: AMMONIA,  in the last 168 hours CBC:  Recent Labs Lab 03/14/13 0940 03/14/13 1700 03/17/13 0612  WBC 9.3 8.6 8.9  NEUTROABS 7.9*  --   --   HGB 11.6* 10.0* 8.5*  HCT 35.0* 30.3* 26.1*  MCV 84.3 85.1 87.6  PLT 223 190 141*   CBG:  Recent Labs Lab 03/16/13 0853 03/16/13 1449 03/16/13 1740 03/16/13 2159 03/17/13 0805  GLUCAP 110* 114* 130* 151* 143*    Recent Results (from the  past 240 hour(s))  MRSA PCR SCREENING     Status: Abnormal   Collection Time    03/14/13  9:45 PM      Result Value Range Status   MRSA by PCR POSITIVE (*) NEGATIVE Final   Comment:            The GeneXpert MRSA Assay (FDA     approved for NASAL specimens     only), is one component of a     comprehensive MRSA colonization     surveillance program. It is not     intended to diagnose MRSA     infection nor to guide or     monitor treatment for     MRSA infections.     RESULT CALLED TO, READ BACK BY AND VERIFIED WITH:     CALLED TO RN ARIEL MUHAMMAD [phone] @0041  [name]  CULTURE, BLOOD (ROUTINE X 2)     Status: None   Collection Time    03/16/13  6:23 AM      Result Value Range Status   Specimen Description BLOOD RIGHT HAND   Final    Special Requests BOTTLES DRAWN AEROBIC AND ANAEROBIC 10CC EACH   Final   Culture  Setup Time     Final   Value: 03/16/2013 11:09     Performed at [facility]   Culture     Final   Value:        BLOOD CULTURE RECEIVED NO GROWTH TO DATE CULTURE WILL BE HELD FOR 5 DAYS BEFORE ISSUING A FINAL NEGATIVE REPORT     Performed at [facility]   Report Status PENDING   Incomplete  CULTURE, BLOOD (ROUTINE X 2)     Status: None   Collection Time    03/16/13  6:33 AM      Result Value Range Status   Specimen Description BLOOD LEFT HAND   Final   Special Requests BOTTLES DRAWN AEROBIC ONLY 8CC   Final   Culture  Setup Time     Final   Value: 03/16/2013 11:09     Performed at [facility]   Culture     Final   Value:        BLOOD CULTURE RECEIVED NO GROWTH TO DATE CULTURE WILL BE HELD FOR 5 DAYS BEFORE ISSUING A FINAL NEGATIVE REPORT     Performed at [facility]   Report Status PENDING   Incomplete     Studies: Dg Chest Port 1 View  03/15/2013   CLINICAL DATA:  Pre-op baseline. Assess airspace disease. History of diabetes and Crohn's disease.  EXAM: PORTABLE CHEST - 1 VIEW  COMPARISON:  Chest radiographs 12/01/2011. CT chest 12/01/2011 and abdomen 03/14/2013.  FINDINGS: 1704 hr. Nasogastric tube projects below the diaphragm. There is mild atelectasis at both lung bases. No confluent airspace opacity or pleural effusion is seen. There is linear right perihilar scarring which is unchanged. The heart size and mediastinal contours are stable. There is fragmentation and collapse of the right humeral head consistent with chronic avascular necrosis.  IMPRESSION: Mild bibasilar atelectasis. No consolidation or other significant changes identified.   Electronically Signed   By: Roxy Horseman M.D.   On: 03/15/2013 17:50   Dg Abd 2 Views  03/16/2013   CLINICAL DATA:  Small-bowel obstruction  EXAM: ABDOMEN - 2 VIEW  COMPARISON:  03/15/2013  FINDINGS: A nasogastric catheter is now  noted within the stomach. There are embolization coils identified stable from the  prior exam. Multiple dilated loops of small bowel are again identified. The overall appearance is slightly worse than that seen on the prior exam. This may be related in part due to more air as opposed of fluid within the bowel. Continued followup is recommended. No free air is seen. An ostomy is noted in the right lower quadrant.  IMPRESSION: Increase in the degree of gaseous distension although this may be related to some changes in the fluid dynamics within the small bowel. No free air is seen.   Electronically Signed   By: Alcide Clever M.D.   On: 03/16/2013 08:24    Scheduled Meds: . arformoterol  15 mcg Nebulization BID  . budesonide  0.25 mg Nebulization BID  . Chlorhexidine Gluconate Cloth  6 each Topical Q0600  . fluticasone  2 spray Each Nare Daily  . insulin aspart  0-9 Units Subcutaneous TID WC  . mupirocin ointment  1 application Nasal BID  . tiotropium  18 mcg Inhalation Daily   Continuous Infusions: . dextrose 5 % and 0.9% NaCl 100 mL (03/16/13 1600)    Active Problems:   DIABETES, TYPE 2   CROHN'S DISEASE   Anxiety disorder   Chronic respiratory failure   SBO (small bowel obstruction)   Hypoxemia   COPD exacerbation    Jari Favre PA-S [facility] Pager [phone] If 7PM-7AM, please contact night-coverage at [email or web address], password [facility] 03/17/2013, 9:56 AM  LOS: 3 days    Attending - Patient seen and examined, agree with the above assessment and plan. Thankfully doing well post extubation post laparotomy. COPD seems stable. Continue current care.   Windell Norfolk MD

## 2013-03-17 NOTE — Progress Notes (Signed)
1 Day Post-Op  Subjective: Pt doing okay.  NG in place, foley.  No flatus in ostomy.  Objective: Vital signs in last 24 hours: Temp:  [98.3 F (36.8 C)-99.4 F (37.4 C)] 98.7 F (37.1 C) (10/23 0405) Pulse Rate:  [85-95] 92 (10/23 0405) Resp:  [13-19] 13 (10/23 0405) BP: (103-114)/(32-62) 103/62 mmHg (10/23 0405) SpO2:  [91 %-100 %] 100 % (10/23 0405) Weight:  [144 lb 10 oz (65.6 kg)] 144 lb 10 oz (65.6 kg) (10/22 1542) Last BM Date: 03/15/13  Intake/Output from previous day: 10/22 0701 - 10/23 0700 In: 3400 [I.V.:3400] Out: 1830 [Urine:1480; Emesis/NG output:150] Intake/Output this shift:   Physical Exam: General appearance: alert, cooperative, appears stated age and no distress Resp: clear to auscultation bilaterally Cardio: regular rate and rhythm, S1, S2 normal, no murmur, click, rub or gallop GI: hypoactive bowel sounds.  midline incision-staples are intact, edges are approximated, minial sanguinous output on the dressing and dressing was changes.  ostomy without flatus or stool.  Extremities: extremities normal, atraumatic, no cyanosis or edema  Lab Results:   Recent Labs  03/14/13 1700 03/17/13 0612  WBC 8.6 8.9  HGB 10.0* 8.5*  HCT 30.3* 26.1*  PLT 190 141*   BMET  Recent Labs  03/16/13 0623 03/17/13 0612  NA 149* 148*  K 3.5 3.7  CL 116* 116*  CO2 25 22  GLUCOSE 146* 170*  BUN 14 12  CREATININE 1.10 0.89  CALCIUM 8.4 7.7*   PT/INR No results found for this basename: LABPROT, INR,  in the last 72 hours ABG No results found for this basename: PHART, PCO2, PO2, HCO3,  in the last 72 hours  Studies/Results: Dg Chest Port 1 View  03/15/2013   CLINICAL DATA:  Pre-op baseline. Assess airspace disease. History of diabetes and Crohn's disease.  EXAM: PORTABLE CHEST - 1 VIEW  COMPARISON:  Chest radiographs 12/01/2011. CT chest 12/01/2011 and abdomen 03/14/2013.  FINDINGS: 1704 hr. Nasogastric tube projects below the diaphragm. There is mild atelectasis  at both lung bases. No confluent airspace opacity or pleural effusion is seen. There is linear right perihilar scarring which is unchanged. The heart size and mediastinal contours are stable. There is fragmentation and collapse of the right humeral head consistent with chronic avascular necrosis.  IMPRESSION: Mild bibasilar atelectasis. No consolidation or other significant changes identified.   Electronically Signed   By: Roxy Horseman M.D.   On: 03/15/2013 17:50   Dg Abd 2 Views  03/16/2013   CLINICAL DATA:  Small-bowel obstruction  EXAM: ABDOMEN - 2 VIEW  COMPARISON:  03/15/2013  FINDINGS: A nasogastric catheter is now noted within the stomach. There are embolization coils identified stable from the prior exam. Multiple dilated loops of small bowel are again identified. The overall appearance is slightly worse than that seen on the prior exam. This may be related in part due to more air as opposed of fluid within the bowel. Continued followup is recommended. No free air is seen. An ostomy is noted in the right lower quadrant.  IMPRESSION: Increase in the degree of gaseous distension although this may be related to some changes in the fluid dynamics within the small bowel. No free air is seen.   Electronically Signed   By: Alcide Clever M.D.   On: 03/16/2013 08:24   Dg Abd 2 Views  03/15/2013   CLINICAL DATA:  Small bowel obstruction. Distended abdomen. FOLLOW UP.  EXAM: ABDOMEN - 2 VIEW  COMPARISON:  03/14/2013  FINDINGS: Continued small  bowel obstruction pattern with dilated small bowel loops and air-fluid levels. Suspect no real change since prior CT. No free air or organomegaly. No suspicious calcifications. No acute bony abnormality. Lung bases are clear.  IMPRESSION: Stable small bowel obstruction pattern.   Electronically Signed   By: Charlett Nose M.D.   On: 03/15/2013 08:37    Anti-infectives: Anti-infectives   Start     Dose/Rate Route Frequency Ordered Stop   03/16/13 0930  ertapenem (INVANZ)  1 g in sodium chloride 0.9 % 50 mL IVPB  Status:  Discontinued     1 g 100 mL/hr over 30 Minutes Intravenous Every 24 hours 03/16/13 0841 03/16/13 1548   03/14/13 2200  hydroxychloroquine (PLAQUENIL) tablet 200 mg  Status:  Discontinued     200 mg Oral 2 times daily 03/14/13 1624 03/16/13 1548      Assessment/Plan: Small bowel obstruction S/p Exploratory laparotomy, LOA, insertion of biologic mesh(Dr. Hoxworth 03/16/13) -POD #1 She is doing better.  She may have limited ice ships today -continue NGT to low intermittent suction(166ml/24h) -up to chair TID, PT to ambulate -pain control, antiemetics -WOC consult -routine dry dressing change to abdomen    LOS: 3 days    [name] ANP-BC  03/17/2013 8:19 AM

## 2013-03-17 NOTE — Consult Note (Addendum)
WOC wound consult note Reason for Consult: Consult requested for ostomy care.  Pt states she had colostomy for 7 years and is independent with pouch application, emptying, and obtaining supplies.  Denies any problems or need for further assistance.  Pouch intact with good seal.  No stool or flatus at this time and she has been assessed by [facility].  She denies need to order supplies and states she brought hers from home. Please re-consult if further assistance is needed.  Thank-you,  Cammie Mcgee MSN, RN, CWOCN, [place], CNS [phone]

## 2013-03-17 NOTE — Progress Notes (Signed)
PULMONARY  / CRITICAL CARE MEDICINE  Name: [name] MRN: [id number] DOB: January 10, 1951    ADMISSION DATE:  03/14/2013 CONSULTATION DATE:  03/15/2013  REFERRING MD :  Arthor Captain PRIMARY SERVICE:  Internal Medicine  CHIEF COMPLAINT:  Chronic resp failure, COPD  BRIEF PATIENT DESCRIPTION: 62 y.o. F with multiple medical co-morbidities including Gold Stage D COPD, followed by Dr. [name].  Pt presented to ED on 10/20 with diffuse abdominal pain, found to have SBO.  PCCM consulted for pre-surgical clearance.  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 female with PMH of crohn's disease, COPD on oxygen, s/p ileostomy and colectomy in 2007.  She presented to the [name] ED on 10/20 with worsening abdominal pain associated with nausea and vomiting x 1 day.  In ED, an abdominal CT was performed revealing a SBO. Surgery was consulted who plans to take the pt to the OR.  Surgery wanted to get pre-op clearance from a Pulmonary stand point since [name] has gold stage D COPD, followed by Dr. [name] in the office.   Her maintenance meds include Spiriva + brovana + budesonide.  She reports that her breathing was doing well prior to this hospitalization.  She denies having any recent difficulty in regards to breathing.   She also denies having to use her inhalers more than usual, worsening wheezing, recent cough, fevers, chills, sweats, chest pain, SOB.   She does report some mild throat and chest discomfort currently, but attributes this to her NG tube that was recently inserted.  She is also having continued abdominal pain/discomfort from her SBO.  SUBJECTIVE/INTERVAL EVENTS: Underwent sgy on 10/22, did very well.  Stable from pulm  standpoint,  denies dyspnea, wheeze, cough, sputum  VITAL SIGNS: Temp:  [98.3 F (36.8 C)-99.4 F (37.4 C)] 98.7 F (37.1 C) (10/23 0405) Pulse Rate:  [85-95] 92 (10/23 0405) Resp:  [13-19] 13 (10/23 0405) BP: (103-114)/(32-62) 103/62 mmHg (10/23 0405) SpO2:  [91 %-100 %] 100 % (10/23 0405) Weight:  [65.6 kg (144 lb 10 oz)] 65.6 kg (144 lb 10 oz) (10/22 1542)  PHYSICAL EXAMINATION: General:  Pleasant female, up in chair, comfortable in NAD. Neuro:  A&O x 3.  Mood and affect appropriate. HEENT:  [place]/AT.  PERRL.  MMM.  NG tube in place Neck:  No LAD.  Trachea midline.  No JVD. Cardiovascular:  RRR, no M/R/G. Lungs:  Resp's even and unlabored.  Distant but no wheeze or crackles Abdomen:  BS hypoactive, ostomy pink Musculoskeletal:  No gross deformities.  No edema. Skin:  Warm and dry.  Recent Labs Lab 03/14/13 0940 03/14/13 1700 03/16/13 0623 03/17/13 0612  NA 141  --  149* 148*  K 4.4  --  3.5 3.7  CL 104  --  116* 116*  CO2 24  --  25 22  BUN 14  --  14 12  CREATININE 1.00 0.98 1.10 0.89  GLUCOSE 90  --  146* 170*    Recent Labs Lab 03/14/13 0940 03/14/13 1700 03/17/13 0612  HGB 11.6* 10.0* 8.5*  HCT 35.0* 30.3* 26.1*  WBC 9.3 8.6 8.9  PLT 223 190 141*   Dg Chest Port 1 View  03/15/2013   CLINICAL DATA:  Pre-op  baseline. Assess airspace disease. History of diabetes and Crohn's disease.  EXAM: PORTABLE CHEST - 1 VIEW  COMPARISON:  Chest radiographs 12/01/2011. CT chest 12/01/2011 and abdomen 03/14/2013.  FINDINGS: 1704 hr. Nasogastric tube projects below the diaphragm. There is mild atelectasis at both lung bases. No confluent airspace opacity or pleural effusion is seen. There is linear right perihilar scarring which is unchanged. The heart size and mediastinal contours are stable. There is fragmentation and collapse of the right humeral head consistent with chronic avascular necrosis.  IMPRESSION: Mild bibasilar atelectasis. No consolidation or other significant  changes identified.   Electronically Signed   By: Roxy Horseman M.D.   On: 03/15/2013 17:50   Dg Abd 2 Views  03/16/2013   CLINICAL DATA:  Small-bowel obstruction  EXAM: ABDOMEN - 2 VIEW  COMPARISON:  03/15/2013  FINDINGS: A nasogastric catheter is now noted within the stomach. There are embolization coils identified stable from the prior exam. Multiple dilated loops of small bowel are again identified. The overall appearance is slightly worse than that seen on the prior exam. This may be related in part due to more air as opposed of fluid within the bowel. Continued followup is recommended. No free air is seen. An ostomy is noted in the right lower quadrant.  IMPRESSION: Increase in the degree of gaseous distension although this may be related to some changes in the fluid dynamics within the small bowel. No free air is seen.   Electronically Signed   By: Alcide Clever M.D.   On: 03/16/2013 08:24    ASSESSMENT / PLAN:  SBO s/p lysis adhesions 10/22 -  COPD with Chronic Respiratory Failure - in the setting of COPD (Gold stage D with emphysematous and asthmatic bronchitic component). She tolerated sgy extremely well from pulm standpoint, no evidence decompensation.  Crohn's Disease  Recs:  - continue her home BD regimen > spiriva qd + brovana/budesonide nebs bid - home O2 > 2.5 L/min at rest and 3L/min w exertion.  - pulm hygiene - restart home Plaquanil when deemed safe to do so post surgery  - f/u with Dr [name] soon post discharge > I set appt for 11/6 at 9:45am  Call if we can help further before she goes home.   Levy Pupa, MD, PhD 03/17/2013, 10:35 AM [facility] [phone] or if no answer [phone]

## 2013-03-17 NOTE — Progress Notes (Signed)
Patient interviewed and examined, agree with PA note above.  Mariella Saa MD, FACS  03/17/2013 9:14 AM

## 2013-03-17 NOTE — Progress Notes (Signed)
OP findings notes no clear active Crohns Please call us if needed.

## 2013-03-18 DIAGNOSIS — R7881 Bacteremia: Secondary | ICD-10-CM

## 2013-03-18 DIAGNOSIS — A4901 Methicillin susceptible Staphylococcus aureus infection, unspecified site: Secondary | ICD-10-CM

## 2013-03-18 LAB — GLUCOSE, CAPILLARY
Glucose-Capillary: 124 mg/dL — ABNORMAL HIGH (ref 70–99)
Glucose-Capillary: 127 mg/dL — ABNORMAL HIGH (ref 70–99)
Glucose-Capillary: 95 mg/dL (ref 70–99)
Glucose-Capillary: 89 mg/dL (ref 70–99)

## 2013-03-18 LAB — CBC
HCT: 24.1 % — ABNORMAL LOW (ref 36.0–46.0)
MCH: 28.2 pg (ref 26.0–34.0)
MCHC: 32.4 g/dL (ref 30.0–36.0)
RDW: 13.4 % (ref 11.5–15.5)
WBC: 12.3 10*3/uL — ABNORMAL HIGH (ref 4.0–10.5)

## 2013-03-18 LAB — BASIC METABOLIC PANEL
BUN: 10 mg/dL (ref 6–23)
Calcium: 7.8 mg/dL — ABNORMAL LOW (ref 8.4–10.5)
Chloride: 117 mEq/L — ABNORMAL HIGH (ref 96–112)
GFR calc Af Amer: 84 mL/min — ABNORMAL LOW (ref >90)
GFR calc non Af Amer: 72 mL/min — ABNORMAL LOW (ref >90)
Potassium: 3.5 mEq/L (ref 3.5–5.1)
Sodium: 151 mEq/L — ABNORMAL HIGH (ref 135–145)

## 2013-03-18 MED ORDER — VANCOMYCIN HCL IN DEXTROSE 750-5 MG/150ML-% IV SOLN
750.0000 mg | Freq: Two times a day (BID) | INTRAVENOUS | Status: DC
  Administered 2013-03-18 – 2013-03-22 (×10): 750 mg via INTRAVENOUS
  Filled 2013-03-18 (×12): qty 150

## 2013-03-18 MED ORDER — SODIUM CHLORIDE 0.45 % IV SOLN
INTRAVENOUS | Status: DC
  Administered 2013-03-18 – 2013-03-22 (×7): via INTRAVENOUS

## 2013-03-18 NOTE — Progress Notes (Signed)
2 Days Post-Op  Subjective: Feeling better, NG drainage down, Dr. Johna Sheriff says NG can come out.  Objective: Vital signs in last 24 hours: Temp:  [97.8 F (36.6 C)-98.9 F (37.2 C)] 98.1 F (36.7 C) (10/24 0745) Pulse Rate:  [88-104] 91 (10/24 0745) Resp:  [12-22] 20 (10/24 0745) BP: (101-124)/(60-62) 120/62 mmHg (10/24 0408) SpO2:  [96 %-100 %] 100 % (10/24 0748) Last BM Date: 03/17/13 700 from the NG yesterday, only 555 urine output, 100 from the ileostomy Afebrile, VSS, still a little tachycardic Na 151 WBC up some today H/H down today. Intake/Output from previous day: 10/23 0701 - 10/24 0700 In: 2350 [I.V.:2200; IV Piggyback:150] Out: 1355 [Urine:555; Emesis/NG output:700; Stool:100] Intake/Output this shift: Total I/O In: 125 [I.V.:125] Out: 150 [Urine:150]  General appearance: alert, cooperative and no distress GI: hypoactive, some stool in ileostomy bag. Dressing is dry.  Lab Results:   Recent Labs  03/17/13 0612 03/18/13 0500  WBC 8.9 12.3*  HGB 8.5* 7.8*  HCT 26.1* 24.1*  PLT 141* 143*    BMET  Recent Labs  03/17/13 0612 03/18/13 0500  NA 148* 151*  K 3.7 3.5  CL 116* 117*  CO2 22 25  GLUCOSE 170* 139*  BUN 12 10  CREATININE 0.89 0.84  CALCIUM 7.7* 7.8*   PT/INR No results found for this basename: LABPROT, INR,  in the last 72 hours   Recent Labs Lab 03/14/13 0940  AST 25  ALT 13  ALKPHOS 76  BILITOT 0.4  PROT 7.6  ALBUMIN 3.8     Lipase     Component Value Date/Time   LIPASE 23 03/14/2013 0940     Studies/Results: No results found.  Medications: . arformoterol  15 mcg Nebulization BID  . budesonide  0.25 mg Nebulization BID  . Chlorhexidine Gluconate Cloth  6 each Topical Q0600  . fluticasone  2 spray Each Nare Daily  . insulin aspart  0-9 Units Subcutaneous TID WC  . mupirocin ointment  1 application Nasal BID  . tiotropium  18 mcg Inhalation Daily  . vancomycin  750 mg Intravenous Q12H    Assessment/Plan SBO  (small bowel obstruction), Crohn's colitis, with stomal and incisional hernias. S/p REPAIR OF INCISIONAL AND PARASTOMAL HERNIAS, EXPLORATORY LAPAROTOMY  LYSIS OF ADHESIONS FOR SMALL BOWEL OBSTRUCTION INSERTION OF BIOLOGIC MESH, 03/16/2013, Mariella Saa, MD.  COPD AODM TYPE II CROHN'S , S/P ileosotomy and colectomy 2007 Hypertension Rheumatoid Arthritis EGD/balloon dilitation Hx of normal cardiac cath 2009   Plan:  NG out, ice chips with some flavoring, OOB today, begin to mobilize Defer high Na to Medicine.      LOS: 4 days    [name] 03/18/2013

## 2013-03-18 NOTE — Progress Notes (Signed)
Pt transferred to [id number] per MD order. Report called to receiving nurse and all questions answered.

## 2013-03-18 NOTE — Progress Notes (Signed)
CRITICAL VALUE ALERT  Critical value received:  Blood culture- Gram positive cocci and clusters in aerobic bottle   Date of notification:  03/18/13  Time of notification:  0035  Critical value read back:yes  Nurse who received alert:  D. Madelin Rear, RN  MD notified (1st page):  Donnamarie Poag, NP  Time of first page:  0110  MD notified (2nd page):  Time of second page:  Responding MD:  Donnamarie Poag, NP  Time MD responded:  [phone]

## 2013-03-18 NOTE — Progress Notes (Signed)
PATIENT DETAILS Name: MISHELLE HASSAN Age: 62 y.o. Sex: female Date of Birth: 1950/08/14 Admit Date: 03/14/2013 Admitting Physician Kathlen Mody, MD RUE:[id number],XBJYN N, MD  Subjective: Feels much better-has liquid stool in the ileostomy-asking for food.  Assessment/Plan: SBO  -etiology likely Adhesions vs. Crohn's  -Post-op-laparotomy for SBO - laparotomy done on 10/22.  - Stable NG tube in place,has liquid stool in the ileostomy,  we will defer further postop issues to the surgical team.   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 Appreciate pulmonary consultation.   Hypernatremia -2/2 to NPO, NG suction -change IVF to 0.45 NS at 125 cc/hr  ?Gram positive Bacteremia -one set of blood cultures positive for gram positive cocci-?contaminant-continue with Vanco till final culture results  Crohn's  -S/P total colectomy.  -Cared for by [facility].  - Stable   Anemia -?secondary to peri-operative blood loss, acute illness -monitor, follow H/H and transfuse prn  Diabetes  -A1C 5.6 yesterday  -On Victoza at home -currently on hold  -She is on sliding scale sensitive as she is NPO -CBG's stable   HTN  -Well controlled on Cardizem and Metoprolol-hold antihypertensives as her BP is stable (103/62) - we'll resume as needed   Anxiety  -Not well controlled-but better than last few days  -On ativan and celexa   Disposition: Remain inpatient-transfer to med-surg  DVT Prophylaxis: SCD's  Code Status: Full code   Family Communication None at bedside  Procedures:  Laparotomy on 10/22  CONSULTS:  GI and general surgery   MEDICATIONS: Scheduled Meds: . arformoterol  15 mcg Nebulization BID  . budesonide  0.25 mg Nebulization BID  . Chlorhexidine Gluconate Cloth  6 each Topical Q0600  . fluticasone  2 spray Each Nare Daily  .  insulin aspart  0-9 Units Subcutaneous TID WC  . mupirocin ointment  1 application Nasal BID  . tiotropium  18 mcg Inhalation Daily  . vancomycin  750 mg Intravenous Q12H   Continuous Infusions: . sodium chloride     PRN Meds:.acetaminophen, morphine injection, ondansetron (ZOFRAN) IV, phenol  Antibiotics: Anti-infectives   Start     Dose/Rate Route Frequency Ordered Stop   03/18/13 0200  vancomycin (VANCOCIN) IVPB 750 mg/150 ml premix     750 mg 150 mL/hr over 60 Minutes Intravenous Every 12 hours 03/18/13 0126     03/16/13 0930  ertapenem (INVANZ) 1 g in sodium chloride 0.9 % 50 mL IVPB  Status:  Discontinued     1 g 100 mL/hr over 30 Minutes Intravenous Every 24 hours 03/16/13 0841 03/16/13 1548   03/14/13 2200  hydroxychloroquine (PLAQUENIL) tablet 200 mg  Status:  Discontinued     200 mg Oral 2 times daily 03/14/13 1624 03/16/13 1548       PHYSICAL EXAM: Vital signs in last 24 hours: Filed Vitals:   03/18/13 0014 03/18/13 0408 03/18/13 0745 03/18/13 0748  BP: 115/61 120/62    Pulse: 98 88    Temp: 98.4 F (36.9 C) 97.8 F (36.6 C) 98.1 F (36.7 C)   TempSrc: Oral Oral Oral   Resp: 22 12    Height:      Weight:      SpO2: 100% 100%  100%    Weight change:  Filed Weights   03/14/13 0930 03/14/13 1651 03/16/13 1542  Weight: 53.978 kg (119 lb) 54.432 kg (120 lb) 65.6 kg (  144 lb 10 oz)   Body mass index is 27.34 kg/(m^2).   Gen Exam: Awake and alert with clear speech.   Neck: Supple, No JVD.   Chest: B/L Clear.   CVS: S1 S2 Regular, no murmurs.  Abdomen: soft, BS +-sluggish, dressing dry Extremities: no edema, lower extremities warm to touch. Neurologic: Non Focal.   Skin: No Rash.   Wounds: N/A.    Intake/Output from previous day:  Intake/Output Summary (Last 24 hours) at 03/18/13 0827 Last data filed at 03/18/13 0700  Gross per 24 hour  Intake   2250 ml  Output   1330 ml  Net    920 ml     LAB RESULTS: CBC  Recent Labs Lab 03/14/13 0940  03/14/13 1700 03/17/13 0612 03/18/13 0500  WBC 9.3 8.6 8.9 12.3*  HGB 11.6* 10.0* 8.5* 7.8*  HCT 35.0* 30.3* 26.1* 24.1*  PLT 223 190 141* 143*  MCV 84.3 85.1 87.6 87.0  MCH 28.0 28.1 28.5 28.2  MCHC 33.1 33.0 32.6 32.4  RDW 12.9 13.1 13.8 13.4  LYMPHSABS 1.0  --   --   --   MONOABS 0.4  --   --   --   EOSABS 0.0  --   --   --   BASOSABS 0.0  --   --   --     Chemistries   Recent Labs Lab 03/14/13 0940 03/14/13 1700 03/16/13 0623 03/17/13 0612 03/18/13 0500  NA 141  --  149* 148* 151*  K 4.4  --  3.5 3.7 3.5  CL 104  --  116* 116* 117*  CO2 24  --  25 22 25   GLUCOSE 90  --  146* 170* 139*  BUN 14  --  14 12 10   CREATININE 1.00 0.98 1.10 0.89 0.84  CALCIUM 9.5  --  8.4 7.7* 7.8*    CBG:  Recent Labs Lab 03/16/13 2159 03/17/13 0805 03/17/13 1207 03/17/13 1636 03/17/13 2248  GLUCAP 151* 143* 139* 103* 124*    GFR Estimated Creatinine Clearance: 59.4 ml/min (by C-G formula based on Cr of 0.84).  Coagulation profile No results found for this basename: INR, PROTIME,  in the last 168 hours  Cardiac Enzymes No results found for this basename: CK, CKMB, TROPONINI, MYOGLOBIN,  in the last 168 hours  No components found with this basename: POCBNP,  No results found for this basename: DDIMER,  in the last 72 hours  Recent Labs  03/16/13 0623  HGBA1C 5.6   No results found for this basename: CHOL, HDL, LDLCALC, TRIG, CHOLHDL, LDLDIRECT,  in the last 72 hours No results found for this basename: TSH, T4TOTAL, FREET3, T3FREE, THYROIDAB,  in the last 72 hours No results found for this basename: VITAMINB12, FOLATE, FERRITIN, TIBC, IRON, RETICCTPCT,  in the last 72 hours No results found for this basename: LIPASE, AMYLASE,  in the last 72 hours  Urine Studies No results found for this basename: UACOL, UAPR, USPG, UPH, UTP, UGL, UKET, UBIL, UHGB, UNIT, UROB, ULEU, UEPI, UWBC, URBC, UBAC, CAST, CRYS, UCOM, BILUA,  in the last 72 hours  MICROBIOLOGY: Recent  Results (from the past 240 hour(s))  MRSA PCR SCREENING     Status: Abnormal   Collection Time    03/14/13  9:45 PM      Result Value Range Status   MRSA by PCR POSITIVE (*) NEGATIVE Final   Comment:            The GeneXpert MRSA Assay (FDA  approved for NASAL specimens     only), is one component of a     comprehensive MRSA colonization     surveillance program. It is not     intended to diagnose MRSA     infection nor to guide or     monitor treatment for     MRSA infections.     RESULT CALLED TO, READ BACK BY AND VERIFIED WITH:     CALLED TO RN ARIEL MUHAMMAD [id number] @0041  [name]  CULTURE, BLOOD (ROUTINE X 2)     Status: None   Collection Time    03/16/13  6:23 AM      Result Value Range Status   Specimen Description BLOOD RIGHT HAND   Final   Special Requests BOTTLES DRAWN AEROBIC AND ANAEROBIC 10CC EACH   Final   Culture  Setup Time     Final   Value: 03/16/2013 11:09     Performed at [facility]   Culture     Final   Value:        BLOOD CULTURE RECEIVED NO GROWTH TO DATE CULTURE WILL BE HELD FOR 5 DAYS BEFORE ISSUING A FINAL NEGATIVE REPORT     Performed at [facility]   Report Status PENDING   Incomplete  CULTURE, BLOOD (ROUTINE X 2)     Status: None   Collection Time    03/16/13  6:33 AM      Result Value Range Status   Specimen Description BLOOD LEFT HAND   Final   Special Requests BOTTLES DRAWN AEROBIC ONLY 8CC   Final   Culture  Setup Time     Final   Value: 03/16/2013 11:09     Performed at [facility]   Culture     Final   Value: GRAM POSITIVE COCCI IN CLUSTERS     Note: Gram Stain Report Called to,Read Back By and Verified With: DEANNA DILLON ON 03/18/2013 AT 12:30A BY Serafina Mitchell     Performed at [facility]   Report Status PENDING   Incomplete    RADIOLOGY STUDIES/RESULTS: Ct Abdomen Pelvis W Contrast  03/14/2013   CLINICAL DATA:  Diffuse abdominal pain. Nausea and vomiting. History of Crohn disease.  EXAM: CT ABDOMEN  AND PELVIS WITH CONTRAST  TECHNIQUE: Multidetector CT imaging of the abdomen and pelvis was performed using the standard protocol following bolus administration of intravenous contrast.  CONTRAST:  OMNIPAQUE IOHEXOL 300 MG/ML  SOLN  COMPARISON:  [facility] CT ABDOMEN AND PELVIS WITH CONTRAST from 03/02/2012.  FINDINGS: No focal abnormality is seen in the liver or spleen. The stomach, duodenum, pancreas, gallbladder, and adrenal glands are unremarkable. No evidence for hydronephrosis or renal mass in either kidney. Tiny low-density cortical lesions in the kidneys bilaterally are too small to characterize but are stable and likely represent tiny cysts.  No abdominal aortic aneurysm. There is no free fluid or lymphadenopathy in the abdomen.  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  The patient has dilated small bowel loops tracking down in the central anatomic pelvis were there is mesenteric edema and interloop mesenteric fluid. Small bowel is dilated up to about 2.5 cm in diameter and can be followed to a segment the displays a small bowel feces sign immediately proximal to an abrupt transition zone. The abrupt transition zone is immediately anterior to the staple  line of the Hartmann's pouch. The etiology for the obstruction at this level is most likely secondary to an adhesion. There is no overt small bowel wall thickening or gross hyper enhancement. Ileum distal to the transition zone is completely decompressed.  Bone windows reveal no worrisome lytic or sclerotic osseous lesions.  IMPRESSION: Distal small bowel obstruction with an abrupt transition zone in the mid anatomic pelvis, immediately anterior to the suture line at the blind end of the Hartmann's pouch. There is some associated perienteric edema and interloop mesenteric fluid. Imaging features are  likely secondary to the presence of an adhesion.  I called these results to Dr. Anitra Lauth at approximately 1334 hours on 03/14/2013.   Electronically Signed   By: Kennith Center M.D.   On: 03/14/2013 13:35   Dg Chest Port 1 View  03/15/2013   CLINICAL DATA:  Pre-op baseline. Assess airspace disease. History of diabetes and Crohn's disease.  EXAM: PORTABLE CHEST - 1 VIEW  COMPARISON:  Chest radiographs 12/01/2011. CT chest 12/01/2011 and abdomen 03/14/2013.  FINDINGS: 1704 hr. Nasogastric tube projects below the diaphragm. There is mild atelectasis at both lung bases. No confluent airspace opacity or pleural effusion is seen. There is linear right perihilar scarring which is unchanged. The heart size and mediastinal contours are stable. There is fragmentation and collapse of the right humeral head consistent with chronic avascular necrosis.  IMPRESSION: Mild bibasilar atelectasis. No consolidation or other significant changes identified.   Electronically Signed   By: Roxy Horseman M.D.   On: 03/15/2013 17:50   Dg Abd 2 Views  03/16/2013   CLINICAL DATA:  Small-bowel obstruction  EXAM: ABDOMEN - 2 VIEW  COMPARISON:  03/15/2013  FINDINGS: A nasogastric catheter is now noted within the stomach. There are embolization coils identified stable from the prior exam. Multiple dilated loops of small bowel are again identified. The overall appearance is slightly worse than that seen on the prior exam. This may be related in part due to more air as opposed of fluid within the bowel. Continued followup is recommended. No free air is seen. An ostomy is noted in the right lower quadrant.  IMPRESSION: Increase in the degree of gaseous distension although this may be related to some changes in the fluid dynamics within the small bowel. No free air is seen.   Electronically Signed   By: Alcide Clever M.D.   On: 03/16/2013 08:24   Dg Abd 2 Views  03/15/2013   CLINICAL DATA:  Small bowel obstruction. Distended abdomen. FOLLOW  UP.  EXAM: ABDOMEN - 2 VIEW  COMPARISON:  03/14/2013  FINDINGS: Continued small bowel obstruction pattern with dilated small bowel loops and air-fluid levels. Suspect no real change since prior CT. No free air or organomegaly. No suspicious calcifications. No acute bony abnormality. Lung bases are clear.  IMPRESSION: Stable small bowel obstruction pattern.   Electronically Signed   By: Charlett Nose M.D.   On: 03/15/2013 08:37    Jeoffrey Massed, MD  [facility] Pager:[phone]  If 7PM-7AM, please contact night-coverage [email or web address] Password [email or web address] 03/18/2013, 8:27 AM   LOS: 4 days

## 2013-03-18 NOTE — H&P (Signed)
[facility]     Reason for Consult: MRSA Bacteremia    Referring Physician: Dr. Jerral Ralph  Active Problems:   DIABETES, TYPE 2   CROHN'S DISEASE   Anxiety disorder   Chronic respiratory failure   SBO (small bowel obstruction)   Hypoxemia   COPD exacerbation   . arformoterol  15 mcg Nebulization BID  . budesonide  0.25 mg Nebulization BID  . Chlorhexidine Gluconate Cloth  6 each Topical Q0600  . fluticasone  2 spray Each Nare Daily  . insulin aspart  0-9 Units Subcutaneous TID WC  . mupirocin ointment  1 application Nasal BID  . tiotropium  18 mcg Inhalation Daily  . vancomycin  750 mg Intravenous Q12H    Recommendations: Continue with vancomycin TTE, TEE if TTE is negative Repeat blood cultures Duration will depend on echocardiogram  picc after blood cultures negative at least 48 hours  Assessment: She has Staph aureus bacteremia, 1/2.     Antibiotics: Vancomycin day 1  HPI: [name] is a 62 y.o. female with RA, Crohn's disease, diabetes, COPD on chronic O2, not currently on any immunosuppressive drugs who presented with chills, sweats and a low grade fever 5 days ago. 5 days ago, she also had multiple episodes of vomiting and lower abdominal pain and noted small bowel obstruction. She underwent surgery for the SBO on 10/22. She currently has no GI issues and her chills and sweats have resolved. She has no other systemic complaints such as shortness of breath, back or joint pain.  Did have a fever 10/22 to 101.2 and blood cultures drawn and now positive 1/2 with MRSA.       Review of Systems: A comprehensive review of systems was negative.  Past Medical History  Diagnosis Date  . Other diseases of vocal cords   . Osteoporosis, unspecified   . Other and unspecified hyperlipidemia   . Esophageal reflux   . Chronic airway obstruction, not elsewhere classified   . Regional enteritis of unspecified site   . Obstructive chronic  bronchitis without exacerbation   . Type II or unspecified type diabetes mellitus without mention of complication, not stated as uncontrolled   . Crohn's disease     Ileostomy  . Pelvic adhesions   . Anxiety   . Depression   . Hypertension   . Shortness of breath   . Pneumonia   . Asthma   . Emphysema   . Hematuria   . Rheumatoid arthritis(714.0) 03/2012    [place]  . Severe cervical dysplasia age 56    cone biopsy with neg paps since    History  Substance Use Topics  . Smoking status: Former Smoker -- 1.00 packs/day for 32 years    Quit date: 05/27/2003  . Smokeless tobacco: Never Used  . Alcohol Use: 0.0 oz/week     Comment: occasional    Family History  Problem Relation Age of Onset  . Osteoporosis Mother   . Kidney disease Mother   . Hypertension Sister   . Stroke Sister   . Diabetes Sister    Allergies  Allergen Reactions  . Esomeprazole Magnesium Other (See Comments)    NEXIUM - reaction > aggravated pt's Crohn's disease  . Other     Beans, Dander/Dust, Peas, Mushrooms  . Peanut-Containing Drug Products Other (See Comments)    unknown  . Shellfish Allergy Other (See Comments)    unknown  . Surgical Lubricant Other (See Comments)  Burns skin     OBJECTIVE: Blood pressure 111/67, pulse 99, temperature 98.9 F (37.2 C), temperature source Oral, resp. rate 18, height 5\' 1"  (1.549 m), weight 144 lb 10 oz (65.6 kg), SpO2 100.00%. General: Awake, alert, nad Skin: no rashes Lungs: CTA B Cor: RRR without m Abdomen: soft, nt, nd, +bs Ext: no edema  Microbiology: Recent Results (from the past 240 hour(s))  MRSA PCR SCREENING     Status: Abnormal   Collection Time    03/14/13  9:45 PM      Result Value Range Status   MRSA by PCR POSITIVE (*) NEGATIVE Final   Comment:            The GeneXpert MRSA Assay (FDA     approved for NASAL specimens     only), is one component of a     comprehensive MRSA colonization     surveillance program. It is not      intended to diagnose MRSA     infection nor to guide or     monitor treatment for     MRSA infections.     RESULT CALLED TO, READ BACK BY AND VERIFIED WITH:     CALLED TO RN ARIEL MUHAMMAD [id number] @0041  [name]  CULTURE, BLOOD (ROUTINE X 2)     Status: None   Collection Time    03/16/13  6:23 AM      Result Value Range Status   Specimen Description BLOOD RIGHT HAND   Final   Special Requests BOTTLES DRAWN AEROBIC AND ANAEROBIC 10CC EACH   Final   Culture  Setup Time     Final   Value: 03/16/2013 11:09     Performed at [facility]   Culture     Final   Value:        BLOOD CULTURE RECEIVED NO GROWTH TO DATE CULTURE WILL BE HELD FOR 5 DAYS BEFORE ISSUING A FINAL NEGATIVE REPORT     Performed at [facility]   Report Status PENDING   Incomplete  CULTURE, BLOOD (ROUTINE X 2)     Status: None   Collection Time    03/16/13  6:33 AM      Result Value Range Status   Specimen Description BLOOD LEFT HAND   Final   Special Requests BOTTLES DRAWN AEROBIC ONLY 8CC   Final   Culture  Setup Time     Final   Value: 03/16/2013 11:09     Performed at [facility]   Culture     Final   Value: METHICILLIN RESISTANT STAPHYLOCOCCUS AUREUS     Note: RIFAMPIN AND GENTAMICIN SHOULD NOT BE USED AS SINGLE DRUGS FOR TREATMENT OF STAPH INFECTIONS. CRITICAL RESULT CALLED TO, READ BACK BY AND VERIFIED WITH: RACHEL LESPERANCE 03/18/13 1345 BY SMITHERSJ     Note: Gram Stain Report Called to,Read Back By and Verified With: DEANNA DILLON ON 03/18/2013 AT 12:30A BY Serafina Mitchell     Performed at [facility]   Report Status PENDING   Incomplete    COMER, Molly Maduro, MD [facility] [email or web address] [phone] pager  [phone] cell 03/18/2013, 3:05 PM

## 2013-03-18 NOTE — Progress Notes (Signed)
Pt NG tube d/c'd per MD order. Pt tolerated procedure well.

## 2013-03-18 NOTE — Evaluation (Signed)
Physical Therapy Evaluation Patient Details Name: [name] MRN: [id number] DOB: 03-Oct-1950 Today's Date: 03/18/2013 Time: 4098-1191 PT Time Calculation (min): 63 min  PT Assessment / Plan / Recommendation History of Present Illness  Patient is a 62 year old female with a history of Crohn's colitis, status post total abdominal colectomy and end ileostomy at [facility] in 2007. She has known parastomal and ventral incisional hernias. She now presents with acute small bowel obstruction. Pt underwent REPAIR OF INCISIONAL AND PARASTOMAL HERNIAS, LYSIS OF ADHESIONS FOR SMALL BOWEL OBSTRUCTION and INSERTION OF BIOLOGIC MESH.  Clinical Impression  Patient is s/p above surgery resulting in functional limitations due to the deficits listed below (see PT Problem List).  Patient will benefit from skilled PT to increase their independence and safety with mobility to allow discharge to the venue listed below.       PT Assessment  Patient needs continued PT services    Follow Up Recommendations  Supervision - Intermittent;Home health PT    Does the patient have the potential to tolerate intense rehabilitation      Barriers to Discharge        Equipment Recommendations  None recommended by PT    Recommendations for Other Services     Frequency Min 3X/week    Precautions / Restrictions Precautions Precautions: None   Pertinent Vitals/Pain Abd pain. Pt premedicated.      Mobility  Bed Mobility Bed Mobility: Rolling Left;Left Sidelying to Sit;Sitting - Scoot to Edge of Bed;Sit to Sidelying Left Rolling Left: 3: Mod assist;With rail Left Sidelying to Sit: 3: Mod assist;HOB elevated;With rails Sitting - Scoot to Edge of Bed: 4: Min assist Sit to Sidelying Left: 4: Min assist Details for Bed Mobility Assistance: Verbal cues for technique and incr time. Transfers Transfers: Sit to Stand;Stand to Sit Sit to Stand: 4: Min assist;From bed Stand to Sit: 4: Min guard;To bed Details for  Transfer Assistance: Assist to bring hips up and incr time. Ambulation/Gait Ambulation/Gait Assistance: 4: Min guard Ambulation Distance (Feet): 50 Feet Assistive device: Rolling walker Ambulation/Gait Assistance Details: Pt moves slowly and deliberately due to pain but no loss of balance. Gait Pattern: Step-through pattern;Decreased step length - right;Decreased step length - left;Shuffle Gait velocity: very slow    Exercises     PT Diagnosis: Difficulty walking  PT Problem List: Decreased mobility;Decreased activity tolerance;Pain PT Treatment Interventions: DME instruction;Gait training;Functional mobility training;Therapeutic activities;Patient/family education     PT Goals(Current goals can be found in the care plan section) Acute Rehab PT Goals Patient Stated Goal: go home PT Goal Formulation: With patient Time For Goal Achievement: 03/25/13 Potential to Achieve Goals: Good  Visit Information  Last PT Received On: 03/18/13 Assistance Needed: +1 History of Present Illness: Patient is a 62 year old female with a history of Crohn's colitis, status post total abdominal colectomy and end ileostomy at [facility] in 2007. She has known parastomal and ventral incisional hernias. She now presents with acute small bowel obstruction. Pt underwent REPAIR OF INCISIONAL AND PARASTOMAL HERNIAS, LYSIS OF ADHESIONS FOR SMALL BOWEL OBSTRUCTION and INSERTION OF BIOLOGIC MESH.       Prior Functioning  Home Living Family/patient expects to be discharged to:: Private residence Living Arrangements: Alone Available Help at Discharge: Family;Available PRN/intermittently Type of Home: Apartment Home Access: Level entry Home Layout: One level Home Equipment: Walker - 2 wheels;Cane - single point;Bedside commode;Wheelchair - manual Prior Function Level of Independence: Independent Communication Communication: No difficulties Dominant Hand: Right    Cognition  Cognition Arousal/Alertness:  Awake/alert Behavior During Therapy: WFL for tasks assessed/performed Overall Cognitive Status: Within Functional Limits for tasks assessed    Extremity/Trunk Assessment Upper Extremity Assessment Upper Extremity Assessment: Overall WFL for tasks assessed Lower Extremity Assessment Lower Extremity Assessment: Overall WFL for tasks assessed   Balance Balance Balance Assessed: Yes Static Standing Balance Static Standing - Balance Support: Bilateral upper extremity supported Static Standing - Level of Assistance: 5: Stand by assistance  End of Session PT - End of Session Activity Tolerance: Patient tolerated treatment well Patient left: in bed;with call bell/phone within reach;with family/visitor present Nurse Communication: Mobility status  GP     [facility] 03/18/2013, 3:31 PM  [facility] PT [phone]

## 2013-03-18 NOTE — Progress Notes (Signed)
Report received from [place], [place] for transfer to [phone].   [name]

## 2013-03-18 NOTE — Progress Notes (Signed)
Patient interviewed and examined, agree with PA note above. Doing very well with some return of bowel function. No pulmonary difficulties. Mariella Saa MD, FACS  03/18/2013 6:21 PM

## 2013-03-18 NOTE — Progress Notes (Signed)
NURSING PROGRESS NOTE  LILLYONNA ARMSTEAD [id number] Transfer Data: 03/18/2013 1:51 PM Attending Provider: Maretta Bees, MD WUJ:[id number],WGNFA N, MD Code Status: FULL    [name] is a 62 y.o. female patient transferred from 3S -No acute distress noted.  -No complaints of shortness of breath.  -No complaints of chest pain.   Cardiac Monitoring: N/A  Blood pressure 111/67, pulse 99, temperature 98.9 F (37.2 C), temperature source Oral, resp. rate 18, height 5\' 1"  (1.549 m), weight 65.6 kg (144 lb 10 oz), SpO2 100.00%.   IV Fluids:  IV in place, occlusive dsg intact without redness, IV cath hand left, condition patent and no redness 1/2 normal saline.   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  Past Surgical History:   has past surgical history that includes Ileostomy; Vaginal hysterectomy (1993); Ear Cyst Excision; Cervical cone biopsy (1978); Colon surgery (2007); Esophagus stretch; Oophorectomy; Tracheostomy (2010); Hammer toe surgery (1980's); Balloon dilation (02/10/2012); Colposcopy; Flexible sigmoidoscopy (03/26/2012); Cardiac catheterization (07/08/2007); nm myocar perf wall motion (09/21/2006); Ventral hernia repair (N/A, 03/16/2013); laparotomy (N/A, 03/16/2013); Lysis of  adhesion (N/A, 03/16/2013); and Insertion of mesh (N/A, 03/16/2013).  Social History:   reports that she quit smoking about 9 years ago. She has never used smokeless tobacco. She reports that she drinks alcohol. She reports that she does not use illicit drugs.  Skin: Abdominal midline incision. No signs/symptoms of infection. Right lower quadrant colostomy.   Patient/Family orientated to room. Information packet given to patient/family. Admission inpatient armband information verified with patient/family to include name and date of birth and placed on patient arm. Side rails up x 2, fall assessment and education completed with patient/family. Patient/family able to verbalize understanding of risk associated with falls and verbalized understanding to call for assistance before getting out of bed. Call light within reach. Patient/family able to voice and demonstrate understanding of unit orientation instructions.    Will continue to evaluate and treat per MD orders.   [name]

## 2013-03-18 NOTE — H&P (Signed)
[name] is an 62 y.o. female.  Plan: Continue vancomycin Order TTE Repeat Blood cultures  Assessment: MRSA bacteremia  HPI  62 year old female presents with chills, sweats and a low grade fever 5 days ago. 5 days ago, she also had multiple episodes of vomiting and lower abdominal pain for which she was rushed to the ER and was diagnosed with small bowel obstruction. She underwent surgery for the SBO on 10/22. She currently has no GI issues and her chills and sweats have resolved. She has no other systemic complaints such as shortness of breath, back or joint pain. The highest temperature she has had since her hospital stay has been 101.2 (10/20) and her most recent WBC is 12.3 Her past medical history is significant for COPD (on oxygen therapy) and Chrons disease for which she takes steroids only during flare ups; she had a Chrons flare up a month ago. She also had diabetes and rheumatoid arthritis. She has no history of IV drug use or placement of prosthetic devices.  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  HENT: Negative.   Eyes: Negative.   Respiratory: Positive for shortness of breath.   Gastrointestinal: Negative for diarrhea.  Genitourinary: Negative.   Musculoskeletal: Negative.   Skin: Negative.   Neurological: Negative.   Endo/Heme/Allergies: Negative.   all other exams normal  Blood pressure 111/67, pulse 99, temperature 98.9 F (37.2 C), temperature source Oral, resp. rate 18, height 5\' 1"  (1.549 m), weight 65.6 kg (144 lb 10 oz), SpO2 100.00%. Physical Exam  Constitutional: She is oriented to person, place, and time. She appears well-developed and well-nourished.  Cardiovascular: Normal rate, regular rhythm, normal heart sounds and intact distal pulses.   Respiratory: Effort normal and breath sounds normal.  GI: Soft. Bowel sounds are normal.  Neurological: She is alert and oriented to person, place, and time. She has normal reflexes.   all other exams normal   10/22 0623  10/23 0612  10/24 0500  WBC 8.9 12.3 RBC 2.98 2.77 Hemoglobin 8.5 7.8 HCT 26.1 24.1 Platelets 141 143 Sodium 149 148 151 Potassium 3.5 3.7 3.5 Chloride 116 116 117 CO2 25 22 25  BUN 14 12 10  Creatinine 1.10 0.89 0.84 Calcium 8.4 7.7 7.8 Glucose 146 170 139   DG abdomen 10/21 IMPRESSION:  Increase in the degree of gaseous distension although this may be  related to some changes in the fluid dynamics within the small  bowel. No free air is seen.  10/20 CXR IMPRESSION:  Mild bibasilar atelectasis. No consolidation or other significant  changes identified.   Culture, blood (routine x 2) [[id number]] Collected: 03/16/13 1191 Updated: 03/18/13 1348 Specimen Type: Blood Specimen Description BLOOD LEFT HAND Special Requests BOTTLES DRAWN AEROBIC ONLY 8CC Culture Setup Time - Result:  03/16/2013 11:09 Performed at [facility] Culture - Result: METHICILLIN RESISTANT STAPHYLOCOCCUS AUREUS Note: RIFAMPIN AND GENTAMICIN SHOULD NOT BE USED AS SINGLE DRUGS FOR  TREATMENT OF STAPH INFECTIONS. CRITICAL RESULT CALLED TO, READ BACK BY AND VERIFIED WITH: RACHEL LESPERANCE 03/18/13 1345 BY SMITHERSJ Note: Gram Stain Report Called to,Read Back By and Verified With: DEANNA DILLON ON 03/18/2013 AT 12:30A BY Serafina Mitchell Performed at [facility] Report Status PENDING     Otilio Carpen 03/18/2013, 2:23 PM

## 2013-03-18 NOTE — Progress Notes (Signed)
CRITICAL VALUE ALERT  Critical value received:  MRSA + in blood   Date of notification:  03/18/2013  Time of notification:  1346  Critical value read back:yes  Nurse who received alert:  Cleta Alberts  MD notified (1st page):  Ghimire   Time of first page:  1349  Time MD responded:  1352

## 2013-03-18 NOTE — Progress Notes (Signed)
ANTIBIOTIC CONSULT NOTE - INITIAL  Pharmacy Consult for Vancomycin  Indication: Bacteremia  Allergies  Allergen Reactions  . Esomeprazole Magnesium Other (See Comments)    NEXIUM - reaction > aggravated pt's Crohn's disease  . Other     Beans, Dander/Dust, Peas, Mushrooms  . Peanut-Containing Drug Products Other (See Comments)    unknown  . Shellfish Allergy Other (See Comments)    unknown  . Surgical Lubricant Other (See Comments)    Burns skin     Patient Measurements: Height: 5\' 1"  (154.9 cm) Weight: 144 lb 10 oz (65.6 kg) IBW/kg (Calculated) : 47.8  Vital Signs: Temp: 98.4 F (36.9 C) (10/24 0014) Temp src: Oral (10/24 0014) BP: 115/61 mmHg (10/24 0014) Pulse Rate: 98 (10/24 0014) Intake/Output from previous day: 10/23 0701 - 10/24 0700 In: 1000 [I.V.:1000] Out: 455 [Urine:305; Emesis/NG output:150] Intake/Output from this shift:    Labs:  Recent Labs  03/16/13 0623 03/17/13 0612  WBC  --  8.9  HGB  --  8.5*  PLT  --  141*  CREATININE 1.10 0.89   Estimated Creatinine Clearance: 56.1 ml/min (by C-G formula based on Cr of 0.89). No results found for this basename: VANCOTROUGH, Leodis Binet, VANCORANDOM, GENTTROUGH, GENTPEAK, GENTRANDOM, TOBRATROUGH, TOBRAPEAK, TOBRARND, AMIKACINPEAK, AMIKACINTROU, AMIKACIN,  in the last 72 hours   Microbiology: Recent Results (from the past 720 hour(s))  MRSA PCR SCREENING     Status: Abnormal   Collection Time    03/14/13  9:45 PM      Result Value Range Status   MRSA by PCR POSITIVE (*) NEGATIVE Final   Comment:            The GeneXpert MRSA Assay (FDA     approved for NASAL specimens     only), is one component of a     comprehensive MRSA colonization     surveillance program. It is not     intended to diagnose MRSA     infection nor to guide or     monitor treatment for     MRSA infections.     RESULT CALLED TO, READ BACK BY AND VERIFIED WITH:     CALLED TO RN ARIEL MUHAMMAD [id number] @0041  [name]  CULTURE, BLOOD  (ROUTINE X 2)     Status: None   Collection Time    03/16/13  6:23 AM      Result Value Range Status   Specimen Description BLOOD RIGHT HAND   Final   Special Requests BOTTLES DRAWN AEROBIC AND ANAEROBIC 10CC EACH   Final   Culture  Setup Time     Final   Value: 03/16/2013 11:09     Performed at [facility]   Culture     Final   Value:        BLOOD CULTURE RECEIVED NO GROWTH TO DATE CULTURE WILL BE HELD FOR 5 DAYS BEFORE ISSUING A FINAL NEGATIVE REPORT     Performed at [facility]   Report Status PENDING   Incomplete  CULTURE, BLOOD (ROUTINE X 2)     Status: None   Collection Time    03/16/13  6:33 AM      Result Value Range Status   Specimen Description BLOOD LEFT HAND   Final   Special Requests BOTTLES DRAWN AEROBIC ONLY 8CC   Final   Culture  Setup Time     Final   Value: 03/16/2013 11:09     Performed at [facility]  Final   Value:        BLOOD CULTURE RECEIVED NO GROWTH TO DATE CULTURE WILL BE HELD FOR 5 DAYS BEFORE ISSUING A FINAL NEGATIVE REPORT     Performed at [facility]   Report Status PENDING   Incomplete    Assessment: 62 y/o F here with abdominal pain, s/p surgery on 10/22, now with GPC in clusters in blood culture. WBC 8.9, Tmax 99.4, Scr 0.89 with CrCl ~ 50-60.   Goal of Therapy:  Vancomycin trough level 15-20 mcg/ml  Plan:  -Vancomycin 750mg  IV q12h -Trend WBC, temp, renal function  -F/U cultures for speciation/sensi  Thank you for allowing me to take part in this patient's care,  Abran Duke, PharmD Clinical Pharmacist Phone: [phone] Pager: [phone] 03/18/2013 1:25 AM

## 2013-03-18 NOTE — Progress Notes (Signed)
Utilization review completed.  

## 2013-03-19 DIAGNOSIS — R7881 Bacteremia: Secondary | ICD-10-CM

## 2013-03-19 LAB — BASIC METABOLIC PANEL
BUN: 9 mg/dL (ref 6–23)
CO2: 24 mEq/L (ref 19–32)
Chloride: 111 mEq/L (ref 96–112)
GFR calc Af Amer: 82 mL/min — ABNORMAL LOW (ref >90)
GFR calc non Af Amer: 70 mL/min — ABNORMAL LOW (ref >90)
Glucose, Bld: 82 mg/dL (ref 70–99)
Potassium: 3.3 mEq/L — ABNORMAL LOW (ref 3.5–5.1)
Sodium: 142 mEq/L (ref 135–145)

## 2013-03-19 LAB — CULTURE, BLOOD (ROUTINE X 2)

## 2013-03-19 LAB — GLUCOSE, CAPILLARY
Glucose-Capillary: 77 mg/dL (ref 70–99)
Glucose-Capillary: 61 mg/dL — ABNORMAL LOW (ref 70–99)
Glucose-Capillary: 88 mg/dL (ref 70–99)
Glucose-Capillary: 93 mg/dL (ref 70–99)
Glucose-Capillary: 98 mg/dL (ref 70–99)

## 2013-03-19 LAB — CBC
HCT: 24.7 % — ABNORMAL LOW (ref 36.0–46.0)
Hemoglobin: 7.8 g/dL — ABNORMAL LOW (ref 12.0–15.0)
RDW: 13.4 % (ref 11.5–15.5)
WBC: 11.8 10*3/uL — ABNORMAL HIGH (ref 4.0–10.5)

## 2013-03-19 MED ORDER — DEXTROSE 50 % IV SOLN
INTRAVENOUS | Status: AC
  Administered 2013-03-19: 25 mL via INTRAVENOUS
  Filled 2013-03-19: qty 50

## 2013-03-19 MED ORDER — DEXTROSE 50 % IV SOLN
25.0000 mL | Freq: Once | INTRAVENOUS | Status: AC | PRN
  Administered 2013-03-19: 25 mL via INTRAVENOUS

## 2013-03-19 MED ORDER — HEPARIN SODIUM (PORCINE) 5000 UNIT/ML IJ SOLN
5000.0000 [IU] | Freq: Three times a day (TID) | INTRAMUSCULAR | Status: DC
  Administered 2013-03-19 – 2013-03-22 (×9): 5000 [IU] via SUBCUTANEOUS
  Filled 2013-03-19 (×13): qty 1

## 2013-03-19 NOTE — Progress Notes (Signed)
  Echocardiogram 2D Echocardiogram has been performed.  Helane Briceno, [facility] 03/19/2013, 8:55 AM

## 2013-03-19 NOTE — Progress Notes (Signed)
General Surgery Note  LOS: 5 days  POD -  3 Days Post-Op  Assessment/Plan: 1. REPAIR OF INCISIONAL AND PARASTOMAL HERNIAS, EXPLORATORY LAPAROTOMY,  LYSIS OF ADHESIONS FOR SMALL BOWEL OBSTRUCTION,  INSERTION OF BIOLOGIC MESH - 03/16/2013 - B. Hoxworth  Doing okay.  To start clear liquids.   2.  MRSA bacteremia  2-d Echo okay - 03/18/2013  On Vanc  Seen by Dr. 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 CROHN'S , S/P ileosotomy and colectomy 2007  Hypertension  Rheumatoid Arthritis  EGD/balloon dilitation  Hx of normal cardiac cath 2009 On isolation because of MRSA  DVT prophylaxis - to start Lovenox   Active Problems:   DIABETES, TYPE 2   CROHN'S DISEASE   Anxiety disorder   Chronic respiratory failure   SBO (small bowel obstruction)   Hypoxemia   COPD exacerbation  Subjective:  Doing well.  Minimal discomfort.  Some stool in her ileostomy bag. Objective:   Filed Vitals:   03/19/13 0627  BP: 128/75  Pulse: 92  Temp: 98.7 F (37.1 C)  Resp: 16     Intake/Output from previous day:  10/24 0701 - 10/25 0700 In: 1336.7 [I.V.:1186.7; IV Piggyback:150] Out: 350 [Urine:150; Stool:200]  Intake/Output this shift:  Total I/O In: -  Out: 150 [Stool:150]   Physical Exam:   General: [name] who is alert and oriented.    HEENT: Normal. Pupils equal. .   Lungs: Clear   Abdomen: Soft.  Ostomy in right mid abdomen functioning.   Wound: Okay.  Will leave open.   Lab Results:    Recent Labs  03/18/13 0500 03/19/13 0523  WBC 12.3* 11.8*  HGB 7.8* 7.8*  HCT 24.1* 24.7*  PLT 143* 156    BMET   Recent Labs  03/18/13 0500 03/19/13 0523  NA 151* 142  K 3.5 3.3*  CL 117* 111  CO2 25 24  GLUCOSE 139* 82  BUN 10 9  CREATININE 0.84 0.86  CALCIUM 7.8* 7.9*    PT/INR  No results found for this basename: LABPROT, INR,  in the last 72 hours  ABG  No results found for this  basename: PHART, PCO2, PO2, HCO3,  in the last 72 hours   Studies/Results:  No results found.   Anti-infectives:   Anti-infectives   Start     Dose/Rate Route Frequency Ordered Stop   03/18/13 0200  vancomycin (VANCOCIN) IVPB 750 mg/150 ml premix     750 mg 150 mL/hr over 60 Minutes Intravenous Every 12 hours 03/18/13 0126     03/16/13 0930  ertapenem (INVANZ) 1 g in sodium chloride 0.9 % 50 mL IVPB  Status:  Discontinued     1 g 100 mL/hr over 30 Minutes Intravenous Every 24 hours 03/16/13 0841 03/16/13 1548   03/14/13 2200  hydroxychloroquine (PLAQUENIL) tablet 200 mg  Status:  Discontinued     200 mg Oral 2 times daily 03/14/13 1624 03/16/13 1548      [name] Kin, MD, FACS Pager: [phone] [facility] Office: [phone] 03/19/2013

## 2013-03-19 NOTE — Plan of Care (Signed)
Problem: Discharge Progression Outcomes Goal: Tolerating diet Outcome: Progressing Pt is tolerating ice chips at this time

## 2013-03-19 NOTE — Progress Notes (Signed)
    [facility]    Date of Admission:  03/14/2013   Total days of antibiotics 4        Day 3 vanco        ( 1 dose of erta for preop abtx)   ID: [name] is a 62 y.o. female  with RA, Crohn's disease, diabetes, COPD on chronic O2, not currently on any immunosuppressive drugs who presented with chills, sweats and a low grade fever 5 days ago. 5 days ago, she also had multiple episodes of vomiting and lower abdominal pain and noted small bowel obstruction. She underwent surgery for the SBO on 10/22. She currently has no GI issues and her chills and sweats have resolved. She has no other systemic complaints such as shortness of breath, back or joint pain. Did have a fever 10/22 to 101.2 and blood cultures drawn and now positive 1/2 with MRSA. Repeat blood cx from 10/24 NGTD  Active Problems:   DIABETES, TYPE 2   CROHN'S DISEASE   Anxiety disorder   Chronic respiratory failure   SBO (small bowel obstruction)   Hypoxemia   COPD exacerbation    Subjective: Afebrile; feeling better than yesterday  Medications:  . arformoterol  15 mcg Nebulization BID  . budesonide  0.25 mg Nebulization BID  . fluticasone  2 spray Each Nare Daily  . insulin aspart  0-9 Units Subcutaneous TID WC  . mupirocin ointment  1 application Nasal BID  . tiotropium  18 mcg Inhalation Daily  . vancomycin  750 mg Intravenous Q12H    Objective: Vital signs in last 24 hours: Temp:  [98.7 F (37.1 C)-99.4 F (37.4 C)] 98.7 F (37.1 C) (10/25 0627) Pulse Rate:  [92-99] 92 (10/25 0627) Resp:  [16-18] 16 (10/25 0627) BP: (111-128)/(62-75) 128/75 mmHg (10/25 0627) SpO2:  [99 %-100 %] 99 % (10/25 0908) FiO2 (%):  [28 %] 28 % (10/25 0908)  General: Awake, alert, nad  Skin: no rashes  Lungs: CTA B  Cor: RRR without m  Abdomen: soft, nt, nd, +bs Ext: no edema  Lab Results  Recent Labs  03/18/13 0500 03/19/13 0523  WBC 12.3* 11.8*  HGB 7.8* 7.8*  HCT 24.1* 24.7*  NA  151* 142  K 3.5 3.3*  CL 117* 111  CO2 25 24  BUN 10 9  CREATININE 0.84 0.86    Microbiology: 10/24 blood cx NGTD 10/22 blood cx 1 of 2 + MRSA Studies/Results: No results found.   Assessment/Plan: mrsa bacteremia = continue with vancomycin. Gaol trough of 15-20. Will need to follow up on blood cx from 10/24 to decide when can place picc line for IV antibiotics. Will need to have clearance of bacteremia prior to picc line placement. Await TTE results, if it is is negative, recommend to get TEE. TEE will help [place] decide length of therapy of 2 vs. 4-6 wks.  Drue Second [facility] Cell: [phone] Pager: [phone]  03/19/2013, 11:06 AM

## 2013-03-19 NOTE — Progress Notes (Signed)
Physical Therapy Treatment Patient Details Name: [name] MRN: [id number] DOB: [birth year] Today's Date: 03/19/2013 Time: 4098-1191 PT Time Calculation (min): 24 min  PT Assessment / Plan / Recommendation  History of Present Illness Patient is a 62 year old female with a history of Crohn's colitis, status post total abdominal colectomy and end ileostomy at [facility] in 2007. She has known parastomal and ventral incisional hernias. She now presents with acute small bowel obstruction. Pt underwent REPAIR OF INCISIONAL AND PARASTOMAL HERNIAS, LYSIS OF ADHESIONS FOR SMALL BOWEL OBSTRUCTION and INSERTION OF BIOLOGIC MESH.   PT Comments   Patient making progress with gait.  Follow Up Recommendations  Home health PT;Supervision - Intermittent     Does the patient have the potential to tolerate intense rehabilitation     Barriers to Discharge        Equipment Recommendations  None recommended by PT    Recommendations for Other Services    Frequency Min 3X/week   Progress towards PT Goals Progress towards PT goals: Progressing toward goals  Plan Current plan remains appropriate    Precautions / Restrictions Precautions Precautions: None Restrictions Weight Bearing Restrictions: No   Pertinent Vitals/Pain Pain continues to be limiting factor.    Mobility  Bed Mobility Bed Mobility: Not assessed Transfers Transfers: Sit to Stand;Stand to Sit Sit to Stand: 4: Min assist;With upper extremity assist;With armrests;From chair/3-in-1 Stand to Sit: 4: Min guard;With upper extremity assist;With armrests;To chair/3-in-1 Details for Transfer Assistance: Patient used correct technique, scooting to edge of chair and with proper hand placement.  Assist to rise to standing and for balance initially. Ambulation/Gait Ambulation/Gait Assistance: 4: Min guard Ambulation Distance (Feet): 72 Feet Assistive device: Rolling walker Ambulation/Gait Assistance Details: Safe use of RW.  Slow guarded  gait. Gait Pattern: Step-through pattern;Decreased stride length;Trunk flexed Gait velocity: very slow      PT Goals (current goals can now be found in the care plan section)    Visit Information  Last PT Received On: 03/19/13 Assistance Needed: +1 History of Present Illness: Patient is a 62 year old female with a history of Crohn's colitis, status post total abdominal colectomy and end ileostomy at [facility] in 2007. She has known parastomal and ventral incisional hernias. She now presents with acute small bowel obstruction. Pt underwent REPAIR OF INCISIONAL AND PARASTOMAL HERNIAS, LYSIS OF ADHESIONS FOR SMALL BOWEL OBSTRUCTION and INSERTION OF BIOLOGIC MESH.    Subjective Data  Subjective: "I'm glad I get some food today"   Cognition  Cognition Arousal/Alertness: Awake/alert Behavior During Therapy: WFL for tasks assessed/performed Overall Cognitive Status: Within Functional Limits for tasks assessed    Balance     End of Session PT - End of Session Equipment Utilized During Treatment: Oxygen Activity Tolerance: Patient tolerated treatment well;Patient limited by pain;Patient limited by fatigue Patient left: in chair;with call bell/phone within reach Nurse Communication: Mobility status   GP     Vena Austria 03/19/2013, 2:41 PM Durenda Hurt. Renaldo Fiddler, [facility] Pager [phone]

## 2013-03-19 NOTE — Progress Notes (Addendum)
PATIENT DETAILS Name: [name] Age: 62 y.o. Sex: female Date of Birth: 04/27/1951 Admit Date: 03/14/2013 Admitting Physician Kathlen Mody, MD MVH:[id number],BMWUX N, MD  Subjective: Feels much better-has liquid stool in the ileostomy-asking for regular food.  Assessment/Plan: SBO  -etiology likely Adhesions vs. Crohn's  -Post-op-laparotomy for SBO - laparotomy done on 10/22.  - NG tube removed,has liquid stool in the ileostomy,  we will defer further postop issues to the surgical team.   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 Appreciate pulmonary consultation.   Hypernatremia -2/2 to NPO, NG suction -resolved with 0.45 NS at 125 cc/hr  MRSA Bacteremia -one set of blood cultures positive for MRSA, ID consult obtained, TTE pending -c/w Vanco -day 2  Culture Data 1. Blood culture on 10/22-one set-positive for MRSA 2. Blood culture on 10/24-pending  Crohn's  -S/P total colectomy.  -Cared for by [facility].  - Stable   Anemia -?secondary to peri-operative blood loss, acute illness -monitor, follow H/H and transfuse prn  Diabetes  -A1C 5.6 yesterday  -On Victoza at home -currently on hold  -She is on sliding scale sensitive as she is NPO -CBG's stable   HTN  -Well controlled on Cardizem and Metoprolol-hold antihypertensives as her BP is stable (103/62) - we'll resume as needed   Anxiety  -Not well controlled-but better than last few days  -On ativan and celexa   Disposition: Remain inpatient-transfer to med-surg  DVT Prophylaxis: SCD's  Code Status: Full code   Family Communication None at bedside  Procedures:  Laparotomy on 10/22  CONSULTS:  GI and general surgery   MEDICATIONS: Scheduled Meds: . arformoterol  15 mcg Nebulization BID  . budesonide  0.25 mg Nebulization BID  . fluticasone  2 spray Each Nare Daily  . insulin  aspart  0-9 Units Subcutaneous TID WC  . mupirocin ointment  1 application Nasal BID  . tiotropium  18 mcg Inhalation Daily  . vancomycin  750 mg Intravenous Q12H   Continuous Infusions: . sodium chloride 100 mL/hr at 03/19/13 0625   PRN Meds:.acetaminophen, morphine injection, ondansetron (ZOFRAN) IV, phenol  Antibiotics: Anti-infectives   Start     Dose/Rate Route Frequency Ordered Stop   03/18/13 0200  vancomycin (VANCOCIN) IVPB 750 mg/150 ml premix     750 mg 150 mL/hr over 60 Minutes Intravenous Every 12 hours 03/18/13 0126     03/16/13 0930  ertapenem (INVANZ) 1 g in sodium chloride 0.9 % 50 mL IVPB  Status:  Discontinued     1 g 100 mL/hr over 30 Minutes Intravenous Every 24 hours 03/16/13 0841 03/16/13 1548   03/14/13 2200  hydroxychloroquine (PLAQUENIL) tablet 200 mg  Status:  Discontinued     200 mg Oral 2 times daily 03/14/13 1624 03/16/13 1548       PHYSICAL EXAM: Vital signs in last 24 hours: Filed Vitals:   03/18/13 2031 03/18/13 2123 03/19/13 0627 03/19/13 0908  BP: 114/62  128/75   Pulse: 96  92   Temp: 99.4 F (37.4 C)  98.7 F (37.1 C)   TempSrc: Oral  Oral   Resp: 18  16   Height:      Weight:      SpO2: 100% 100% 100% 99%    Weight change:  Filed Weights   03/14/13 0930 03/14/13 1651 03/16/13 1542  Weight: 53.978 kg (119 lb) 54.432 kg (120 lb) 65.6 kg (  144 lb 10 oz)   Body mass index is 27.34 kg/(m^2).   Gen Exam: Awake and alert with clear speech.   Neck: Supple, No JVD.   Chest: B/L Clear.   CVS: S1 S2 Regular, no murmurs.  Abdomen: soft, BS +-sluggish, dressing dry. Liquid stool in ileostomy Extremities: no edema, lower extremities warm to touch. Neurologic: Non Focal.   Skin: No Rash.   Wounds: N/A.    Intake/Output from previous day:  Intake/Output Summary (Last 24 hours) at 03/19/13 1011 Last data filed at 03/19/13 0537  Gross per 24 hour  Intake 1211.67 ml  Output    200 ml  Net 1011.67 ml     LAB RESULTS: CBC  Recent  Labs Lab 03/14/13 0940 03/14/13 1700 03/17/13 0612 03/18/13 0500 03/19/13 0523  WBC 9.3 8.6 8.9 12.3* 11.8*  HGB 11.6* 10.0* 8.5* 7.8* 7.8*  HCT 35.0* 30.3* 26.1* 24.1* 24.7*  PLT 223 190 141* 143* 156  MCV 84.3 85.1 87.6 87.0 87.6  MCH 28.0 28.1 28.5 28.2 27.7  MCHC 33.1 33.0 32.6 32.4 31.6  RDW 12.9 13.1 13.8 13.4 13.4  LYMPHSABS 1.0  --   --   --   --   MONOABS 0.4  --   --   --   --   EOSABS 0.0  --   --   --   --   BASOSABS 0.0  --   --   --   --     Chemistries   Recent Labs Lab 03/14/13 0940 03/14/13 1700 03/16/13 0623 03/17/13 0612 03/18/13 0500 03/19/13 0523  NA 141  --  149* 148* 151* 142  K 4.4  --  3.5 3.7 3.5 3.3*  CL 104  --  116* 116* 117* 111  CO2 24  --  25 22 25 24   GLUCOSE 90  --  146* 170* 139* 82  BUN 14  --  14 12 10 9   CREATININE 1.00 0.98 1.10 0.89 0.84 0.86  CALCIUM 9.5  --  8.4 7.7* 7.8* 7.9*    CBG:  Recent Labs Lab 03/18/13 0902 03/18/13 1253 03/18/13 1633 03/18/13 2156 03/19/13 0750  GLUCAP 127* 95 89 89 77    GFR Estimated Creatinine Clearance: 58 ml/min (by C-G formula based on Cr of 0.86).  Coagulation profile No results found for this basename: INR, PROTIME,  in the last 168 hours  Cardiac Enzymes No results found for this basename: CK, CKMB, TROPONINI, MYOGLOBIN,  in the last 168 hours  No components found with this basename: POCBNP,  No results found for this basename: DDIMER,  in the last 72 hours No results found for this basename: HGBA1C,  in the last 72 hours No results found for this basename: CHOL, HDL, LDLCALC, TRIG, CHOLHDL, LDLDIRECT,  in the last 72 hours No results found for this basename: TSH, T4TOTAL, FREET3, T3FREE, THYROIDAB,  in the last 72 hours No results found for this basename: VITAMINB12, FOLATE, FERRITIN, TIBC, IRON, RETICCTPCT,  in the last 72 hours No results found for this basename: LIPASE, AMYLASE,  in the last 72 hours  Urine Studies No results found for this basename: UACOL, UAPR,  USPG, UPH, UTP, UGL, UKET, UBIL, UHGB, UNIT, UROB, ULEU, UEPI, UWBC, URBC, UBAC, CAST, CRYS, UCOM, BILUA,  in the last 72 hours  MICROBIOLOGY: Recent Results (from the past 240 hour(s))  MRSA PCR SCREENING     Status: Abnormal   Collection Time    03/14/13  9:45 PM  Result Value Range Status   MRSA by PCR POSITIVE (*) NEGATIVE Final   Comment:            The GeneXpert MRSA Assay (FDA     approved for NASAL specimens     only), is one component of a     comprehensive MRSA colonization     surveillance program. It is not     intended to diagnose MRSA     infection nor to guide or     monitor treatment for     MRSA infections.     RESULT CALLED TO, READ BACK BY AND VERIFIED WITH:     CALLED TO RN ARIEL MUHAMMAD [id number] @0041  [name]  CULTURE, BLOOD (ROUTINE X 2)     Status: None   Collection Time    03/16/13  6:23 AM      Result Value Range Status   Specimen Description BLOOD RIGHT HAND   Final   Special Requests BOTTLES DRAWN AEROBIC AND ANAEROBIC 10CC EACH   Final   Culture  Setup Time     Final   Value: 03/16/2013 11:09     Performed at [facility]   Culture     Final   Value:        BLOOD CULTURE RECEIVED NO GROWTH TO DATE CULTURE WILL BE HELD FOR 5 DAYS BEFORE ISSUING A FINAL NEGATIVE REPORT     Performed at [facility]   Report Status PENDING   Incomplete  CULTURE, BLOOD (ROUTINE X 2)     Status: None   Collection Time    03/16/13  6:33 AM      Result Value Range Status   Specimen Description BLOOD LEFT HAND   Final   Special Requests BOTTLES DRAWN AEROBIC ONLY 8CC   Final   Culture  Setup Time     Final   Value: 03/16/2013 11:09     Performed at [facility]   Culture     Final   Value: METHICILLIN RESISTANT STAPHYLOCOCCUS AUREUS     Note: RIFAMPIN AND GENTAMICIN SHOULD NOT BE USED AS SINGLE DRUGS FOR TREATMENT OF STAPH INFECTIONS. CRITICAL RESULT CALLED TO, READ BACK BY AND VERIFIED WITH: RACHEL LESPERANCE 03/18/13 1345 BY SMITHERSJ      Note: Gram Stain Report Called to,Read Back By and Verified With: DEANNA DILLON ON 03/18/2013 AT 12:30A BY Serafina Mitchell     Performed at [facility]   Report Status 03/19/2013 FINAL   Final   Organism ID, Bacteria METHICILLIN RESISTANT STAPHYLOCOCCUS AUREUS   Final    RADIOLOGY STUDIES/RESULTS: Ct Abdomen Pelvis W Contrast  03/14/2013   CLINICAL DATA:  Diffuse abdominal pain. Nausea and vomiting. History of Crohn disease.  EXAM: CT ABDOMEN AND PELVIS WITH CONTRAST  TECHNIQUE: Multidetector CT imaging of the abdomen and pelvis was performed using the standard protocol following bolus administration of intravenous contrast.  CONTRAST:  OMNIPAQUE IOHEXOL 300 MG/ML  SOLN  COMPARISON:  [facility] CT ABDOMEN AND PELVIS WITH CONTRAST from 03/02/2012.  FINDINGS: No focal abnormality is seen in the liver or spleen. The stomach, duodenum, pancreas, gallbladder, and adrenal glands are unremarkable. No evidence for hydronephrosis or renal mass in either kidney. Tiny low-density cortical lesions in the kidneys bilaterally are too small to characterize but are stable and likely represent tiny cysts.  No abdominal aortic aneurysm. There is no free fluid or lymphadenopathy in the abdomen.  As before, the patient has a paraumbilical hernia containing  a loop of small bowel. 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  The patient has dilated small bowel loops tracking down in the central anatomic pelvis were there is mesenteric edema and interloop mesenteric fluid. Small bowel is dilated up to about 2.5 cm in diameter and can be followed to a segment the displays a small bowel feces sign immediately proximal to an abrupt transition zone. The abrupt transition zone is immediately anterior to the staple line of the Hartmann's pouch. The etiology for the obstruction at this level is most likely secondary to an adhesion. There is no overt small bowel wall  thickening or gross hyper enhancement. Ileum distal to the transition zone is completely decompressed.  Bone windows reveal no worrisome lytic or sclerotic osseous lesions.  IMPRESSION: Distal small bowel obstruction with an abrupt transition zone in the mid anatomic pelvis, immediately anterior to the suture line at the blind end of the Hartmann's pouch. There is some associated perienteric edema and interloop mesenteric fluid. Imaging features are likely secondary to the presence of an adhesion.  I called these results to Dr. Anitra Lauth at approximately 1334 hours on 03/14/2013.   Electronically Signed   By: Kennith Center M.D.   On: 03/14/2013 13:35   Dg Chest Port 1 View  03/15/2013   CLINICAL DATA:  Pre-op baseline. Assess airspace disease. History of diabetes and Crohn's disease.  EXAM: PORTABLE CHEST - 1 VIEW  COMPARISON:  Chest radiographs 12/01/2011. CT chest 12/01/2011 and abdomen 03/14/2013.  FINDINGS: 1704 hr. Nasogastric tube projects below the diaphragm. There is mild atelectasis at both lung bases. No confluent airspace opacity or pleural effusion is seen. There is linear right perihilar scarring which is unchanged. The heart size and mediastinal contours are stable. There is fragmentation and collapse of the right humeral head consistent with chronic avascular necrosis.  IMPRESSION: Mild bibasilar atelectasis. No consolidation or other significant changes identified.   Electronically Signed   By: Roxy Horseman M.D.   On: 03/15/2013 17:50   Dg Abd 2 Views  03/16/2013   CLINICAL DATA:  Small-bowel obstruction  EXAM: ABDOMEN - 2 VIEW  COMPARISON:  03/15/2013  FINDINGS: A nasogastric catheter is now noted within the stomach. There are embolization coils identified stable from the prior exam. Multiple dilated loops of small bowel are again identified. The overall appearance is slightly worse than that seen on the prior exam. This may be related in part due to more air as opposed of fluid within the  bowel. Continued followup is recommended. No free air is seen. An ostomy is noted in the right lower quadrant.  IMPRESSION: Increase in the degree of gaseous distension although this may be related to some changes in the fluid dynamics within the small bowel. No free air is seen.   Electronically Signed   By: Alcide Clever M.D.   On: 03/16/2013 08:24   Dg Abd 2 Views  03/15/2013   CLINICAL DATA:  Small bowel obstruction. Distended abdomen. FOLLOW UP.  EXAM: ABDOMEN - 2 VIEW  COMPARISON:  03/14/2013  FINDINGS: Continued small bowel obstruction pattern with dilated small bowel loops and air-fluid levels. Suspect no real change since prior CT. No free air or organomegaly. No suspicious calcifications. No acute bony abnormality. Lung bases are clear.  IMPRESSION: Stable small bowel obstruction pattern.   Electronically Signed   By: Charlett Nose M.D.   On: 03/15/2013 08:37    Jeoffrey Massed, MD  [facility] Pager:[phone]  If 7PM-7AM, please contact night-coverage [email or web address] Password [email or web address] 03/19/2013, 10:11 AM   LOS: 5 days

## 2013-03-20 DIAGNOSIS — J441 Chronic obstructive pulmonary disease with (acute) exacerbation: Secondary | ICD-10-CM

## 2013-03-20 LAB — CBC
Hemoglobin: 8.4 g/dL — ABNORMAL LOW (ref 12.0–15.0)
MCH: 27.9 pg (ref 26.0–34.0)
MCHC: 32.2 g/dL (ref 30.0–36.0)
Platelets: 176 10*3/uL (ref 150–400)
WBC: 11.6 10*3/uL — ABNORMAL HIGH (ref 4.0–10.5)

## 2013-03-20 LAB — BASIC METABOLIC PANEL
Calcium: 8 mg/dL — ABNORMAL LOW (ref 8.4–10.5)
GFR calc Af Amer: 86 mL/min — ABNORMAL LOW (ref >90)
GFR calc non Af Amer: 75 mL/min — ABNORMAL LOW (ref >90)
Glucose, Bld: 104 mg/dL — ABNORMAL HIGH (ref 70–99)
Potassium: 3.2 mEq/L — ABNORMAL LOW (ref 3.5–5.1)
Sodium: 141 mEq/L (ref 135–145)

## 2013-03-20 LAB — GLUCOSE, CAPILLARY
Glucose-Capillary: 101 mg/dL — ABNORMAL HIGH (ref 70–99)
Glucose-Capillary: 99 mg/dL (ref 70–99)

## 2013-03-20 LAB — VANCOMYCIN, TROUGH: Vancomycin Tr: 43.7 ug/mL (ref 10.0–20.0)

## 2013-03-20 MED ORDER — KETOROLAC TROMETHAMINE 30 MG/ML IJ SOLN
30.0000 mg | Freq: Four times a day (QID) | INTRAMUSCULAR | Status: DC | PRN
  Filled 2013-03-20 (×2): qty 1

## 2013-03-20 MED ORDER — POTASSIUM CHLORIDE CRYS ER 20 MEQ PO TBCR
40.0000 meq | EXTENDED_RELEASE_TABLET | Freq: Once | ORAL | Status: AC
  Administered 2013-03-20: 13:00:00 40 meq via ORAL
  Filled 2013-03-20: qty 2

## 2013-03-20 MED ORDER — ACETAMINOPHEN 325 MG PO TABS
650.0000 mg | ORAL_TABLET | Freq: Four times a day (QID) | ORAL | Status: DC | PRN
  Filled 2013-03-20: qty 2

## 2013-03-20 NOTE — Progress Notes (Signed)
CRITICAL VALUE ALERT  Critical value received:  Vanc. Trough 43.7  Date of notification:  03/20/13  Time of notification:  1504  Critical value read back:yes  Nurse who received alert:  Burtis Junes., RN  MD notified (1st page): Dr. Jerral Ralph  Time of first page:  1308  Responding MD:  Dr. Jerral Ralph  Time MD responded:  [phone]  Pharmacy also informed.  Awaiting new orders.

## 2013-03-20 NOTE — Progress Notes (Signed)
   CARE MANAGEMENT NOTE 03/20/2013  Patient:  [name], [name]   Account Number:  [id number]  Date Initiated:  03/15/2013  Documentation initiated by:  Letha Cape  Subjective/Objective Assessment:   dx sbo  admit- lives with family.     Action/Plan:   Pt is followed by [facility]   Anticipated DC Date:  03/21/2013   Anticipated DC Plan:  HOME W HOME HEALTH SERVICES      DC Planning Services  CM consult      [facility] Choice  HOME HEALTH   Choice offered to / List presented to:  C-1 Patient        HH arranged  HH-1 RN  HH-2 PT  HH-4 NURSE'S AIDE      HH agency  [facility]   Status of service:  In process, will continue to follow Medicare Important Message given?   (If response is "NO", the following Medicare IM given date fields will be blank) Date Medicare IM given:   Date Additional Medicare IM given:    Discharge Disposition:    Per UR Regulation:  Reviewed for med. necessity/level of care/duration of stay  If discussed at Long Length of Stay Meetings, dates discussed:    Comments:  03/20/13 15:20 CM spoke with pt in room for choice.  Pt states she has an aide who come in once [name] month to help her out.  Pt states she gets her ostomy supplies form [facility] in the mail.  She is well versed in the management of her permanent ileostomy (since 2007).  Pt requests [facility] for her HHPT/aide/RN for dressing changes.  Address and contact number verified.  Referral for HHPT/RN/aide faxed to [facility] with pending discharge 10/27-28.  No other CM needs were communicated.  Freddy Jaksch, BSN, [place] [phone].   03/17/13- 1100- Donn Pierini RN, BSN [phone] pt S/p Exploratory laparotomy, LOA, insertion of biologic mesh on 03/16/13- still with NTG to suction today- NCM to follow post op progression for d/c needs (pt has been at [facility] in the past)

## 2013-03-20 NOTE — Progress Notes (Signed)
PATIENT DETAILS Name: [name] Age: 62 y.o. Sex: female Date of Birth: 1951-04-23 Admit Date: 03/14/2013 Admitting Physician Kathlen Mody, MD AVW:[id number],YNWGN N, MD  Subjective: Tolerating liquids-has liquid stool in the ileostomy-asking for regular food.  Assessment/Plan: SBO  -etiology likely Adhesions vs. Crohn's  -Post-op-laparotomy for SBO - laparotomy done on 10/22.  - NG tube has removed,has liquid stool in the ileostomy,  we will defer further postop issues to the surgical team. Tolerating clear liquids.  MRSA Bacteremia -one set of blood cultures positive for MRSA, ID consult obtained, TTE no obvious vegetation, spoke with [place] cards on 10/26, for TEE in am -c/w Vanco -day 3  Culture Data 1. Blood culture on 10/22-one set-positive for MRSA 2. 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 Appreciate pulmonary consultation.   Hypernatremia -2/2 to NPO, NG suction -resolved with hypotonic solution  Crohn's  -S/P total colectomy.  -Cared for by [facility].  - Stable   Anemia -?secondary to peri-operative blood loss, acute illness -monitor, follow H/H and transfuse prn  Diabetes  -A1C 5.6 yesterday  -On Victoza at home -currently on hold  -She is on sliding scale sensitive as she is NPO -CBG's stable   HTN  -Well controlled on Cardizem and Metoprolol-hold antihypertensives as her BP is stable (103/62) - we'll resume as needed   Anxiety  -Not well controlled-but better than last few days  -On ativan and celexa   Disposition: Remain inpatient-HHPT on discharge  DVT Prophylaxis: SCD's  Code Status: Full code   Family Communication None at bedside  Procedures:  Laparotomy on 10/22  CONSULTS:  GI and general surgery   MEDICATIONS: Scheduled Meds: . arformoterol  15 mcg Nebulization BID  .  budesonide  0.25 mg Nebulization BID  . fluticasone  2 spray Each Nare Daily  . heparin subcutaneous  5,000 Units Subcutaneous Q8H  . insulin aspart  0-9 Units Subcutaneous TID WC  . tiotropium  18 mcg Inhalation Daily  . vancomycin  750 mg Intravenous Q12H   Continuous Infusions: . sodium chloride 50 mL/hr at 03/20/13 0215   PRN Meds:.acetaminophen, morphine injection, ondansetron (ZOFRAN) IV, phenol  Antibiotics: Anti-infectives   Start     Dose/Rate Route Frequency Ordered Stop   03/18/13 0200  vancomycin (VANCOCIN) IVPB 750 mg/150 ml premix     750 mg 150 mL/hr over 60 Minutes Intravenous Every 12 hours 03/18/13 0126     03/16/13 0930  ertapenem (INVANZ) 1 g in sodium chloride 0.9 % 50 mL IVPB  Status:  Discontinued     1 g 100 mL/hr over 30 Minutes Intravenous Every 24 hours 03/16/13 0841 03/16/13 1548   03/14/13 2200  hydroxychloroquine (PLAQUENIL) tablet 200 mg  Status:  Discontinued     200 mg Oral 2 times daily 03/14/13 1624 03/16/13 1548       PHYSICAL EXAM: Vital signs in last 24 hours: Filed Vitals:   03/19/13 1700 03/19/13 2100 03/20/13 0500 03/20/13 0811  BP:  128/71 103/63   Pulse:  102 97   Temp: 99.8 F (37.7 C) 99.6 F (37.6 C) 98.8 F (37.1 C)   TempSrc: Oral Oral Oral   Resp:  18 12   Height:      Weight:      SpO2:  99% 97% 99%    Weight change:  Filed Weights   03/14/13 0930 03/14/13 1651  03/16/13 1542  Weight: 53.978 kg (119 lb) 54.432 kg (120 lb) 65.6 kg (144 lb 10 oz)   Body mass index is 27.34 kg/(m^2).   Gen Exam: Awake and alert with clear speech.   Neck: Supple, No JVD.   Chest: B/L Clear.   CVS: S1 S2 Regular, no murmurs.  Abdomen: soft, BS +-sluggish, dressing dry. Liquid stool in ileostomy Extremities: no edema, lower extremities warm to touch. Neurologic: Non Focal.   Skin: No Rash.   Wounds: N/A.    Intake/Output from previous day:  Intake/Output Summary (Last 24 hours) at 03/20/13 0836 Last data filed at 03/20/13  0500  Gross per 24 hour  Intake 2144.17 ml  Output    450 ml  Net 1694.17 ml     LAB RESULTS: CBC  Recent Labs Lab 03/14/13 0940 03/14/13 1700 03/17/13 0612 03/18/13 0500 03/19/13 0523 03/20/13 0503  WBC 9.3 8.6 8.9 12.3* 11.8* 11.6*  HGB 11.6* 10.0* 8.5* 7.8* 7.8* 8.4*  HCT 35.0* 30.3* 26.1* 24.1* 24.7* 26.1*  PLT 223 190 141* 143* 156 176  MCV 84.3 85.1 87.6 87.0 87.6 86.7  MCH 28.0 28.1 28.5 28.2 27.7 27.9  MCHC 33.1 33.0 32.6 32.4 31.6 32.2  RDW 12.9 13.1 13.8 13.4 13.4 13.3  LYMPHSABS 1.0  --   --   --   --   --   MONOABS 0.4  --   --   --   --   --   EOSABS 0.0  --   --   --   --   --   BASOSABS 0.0  --   --   --   --   --     Chemistries   Recent Labs Lab 03/16/13 0623 03/17/13 0612 03/18/13 0500 03/19/13 0523 03/20/13 0503  NA 149* 148* 151* 142 141  K 3.5 3.7 3.5 3.3* 3.2*  CL 116* 116* 117* 111 106  CO2 25 22 25 24 28   GLUCOSE 146* 170* 139* 82 104*  BUN 14 12 10 9 6   CREATININE 1.10 0.89 0.84 0.86 0.82  CALCIUM 8.4 7.7* 7.8* 7.9* 8.0*    CBG:  Recent Labs Lab 03/19/13 1201 03/19/13 1303 03/19/13 1734 03/19/13 2111 03/20/13 0805  GLUCAP 61* 88 93 98 99    GFR Estimated Creatinine Clearance: 60.9 ml/min (by C-G formula based on Cr of 0.82).  Coagulation profile No results found for this basename: INR, PROTIME,  in the last 168 hours  Cardiac Enzymes No results found for this basename: CK, CKMB, TROPONINI, MYOGLOBIN,  in the last 168 hours  No components found with this basename: POCBNP,  No results found for this basename: DDIMER,  in the last 72 hours No results found for this basename: HGBA1C,  in the last 72 hours No results found for this basename: CHOL, HDL, LDLCALC, TRIG, CHOLHDL, LDLDIRECT,  in the last 72 hours No results found for this basename: TSH, T4TOTAL, FREET3, T3FREE, THYROIDAB,  in the last 72 hours No results found for this basename: VITAMINB12, FOLATE, FERRITIN, TIBC, IRON, RETICCTPCT,  in the last 72 hours No  results found for this basename: LIPASE, AMYLASE,  in the last 72 hours  Urine Studies No results found for this basename: UACOL, UAPR, USPG, UPH, UTP, UGL, UKET, UBIL, UHGB, UNIT, UROB, ULEU, UEPI, UWBC, URBC, UBAC, CAST, CRYS, UCOM, BILUA,  in the last 72 hours  MICROBIOLOGY: Recent Results (from the past 240 hour(s))  MRSA PCR SCREENING     Status: Abnormal  Collection Time    03/14/13  9:45 PM      Result Value Range Status   MRSA by PCR POSITIVE (*) NEGATIVE Final   Comment:            The GeneXpert MRSA Assay (FDA     approved for NASAL specimens     only), is one component of a     comprehensive MRSA colonization     surveillance program. It is not     intended to diagnose MRSA     infection nor to guide or     monitor treatment for     MRSA infections.     RESULT CALLED TO, READ BACK BY AND VERIFIED WITH:     CALLED TO RN ARIEL MUHAMMAD [id number] @0041  [name]  CULTURE, BLOOD (ROUTINE X 2)     Status: None   Collection Time    03/16/13  6:23 AM      Result Value Range Status   Specimen Description BLOOD RIGHT HAND   Final   Special Requests BOTTLES DRAWN AEROBIC AND ANAEROBIC 10CC EACH   Final   Culture  Setup Time     Final   Value: 03/16/2013 11:09     Performed at [facility]   Culture     Final   Value:        BLOOD CULTURE RECEIVED NO GROWTH TO DATE CULTURE WILL BE HELD FOR 5 DAYS BEFORE ISSUING A FINAL NEGATIVE REPORT     Performed at [facility]   Report Status PENDING   Incomplete  CULTURE, BLOOD (ROUTINE X 2)     Status: None   Collection Time    03/16/13  6:33 AM      Result Value Range Status   Specimen Description BLOOD LEFT HAND   Final   Special Requests BOTTLES DRAWN AEROBIC ONLY 8CC   Final   Culture  Setup Time     Final   Value: 03/16/2013 11:09     Performed at [facility]   Culture     Final   Value: METHICILLIN RESISTANT STAPHYLOCOCCUS AUREUS     Note: RIFAMPIN AND GENTAMICIN SHOULD NOT BE USED AS SINGLE DRUGS FOR  TREATMENT OF STAPH INFECTIONS. CRITICAL RESULT CALLED TO, READ BACK BY AND VERIFIED WITH: RACHEL LESPERANCE 03/18/13 1345 BY SMITHERSJ     Note: Gram Stain Report Called to,Read Back By and Verified With: DEANNA DILLON ON 03/18/2013 AT 12:30A BY WILEJ     Performed at [facility]   Report Status 03/19/2013 FINAL   Final   Organism ID, Bacteria METHICILLIN RESISTANT STAPHYLOCOCCUS AUREUS   Final  CULTURE, BLOOD (ROUTINE X 2)     Status: None   Collection Time    03/18/13  4:30 PM      Result Value Range Status   Specimen Description BLOOD RIGHT HAND   Final   Special Requests BOTTLES DRAWN AEROBIC ONLY 10CC   Final   Culture  Setup Time     Final   Value: 03/18/2013 22:25     Performed at [facility]   Culture     Final   Value:        BLOOD CULTURE RECEIVED NO GROWTH TO DATE CULTURE WILL BE HELD FOR 5 DAYS BEFORE ISSUING A FINAL NEGATIVE REPORT     Performed at [facility]   Report Status PENDING   Incomplete  CULTURE, BLOOD (ROUTINE X 2)     Status:  None   Collection Time    03/18/13  4:45 PM      Result Value Range Status   Specimen Description BLOOD LEFT ARM   Final   Special Requests BOTTLES DRAWN AEROBIC ONLY 10CC   Final   Culture  Setup Time     Final   Value: 03/18/2013 22:26     Performed at [facility]   Culture     Final   Value:        BLOOD CULTURE RECEIVED NO GROWTH TO DATE CULTURE WILL BE HELD FOR 5 DAYS BEFORE ISSUING A FINAL NEGATIVE REPORT     Performed at [facility]   Report Status PENDING   Incomplete    RADIOLOGY STUDIES/RESULTS: Ct Abdomen Pelvis W Contrast  03/14/2013   CLINICAL DATA:  Diffuse abdominal pain. Nausea and vomiting. History of Crohn disease.  EXAM: CT ABDOMEN AND PELVIS WITH CONTRAST  TECHNIQUE: Multidetector CT imaging of the abdomen and pelvis was performed using the standard protocol following bolus administration of intravenous contrast.  CONTRAST:  OMNIPAQUE IOHEXOL 300 MG/ML  SOLN   COMPARISON:  [facility] CT ABDOMEN AND PELVIS WITH CONTRAST from 03/02/2012.  FINDINGS: No focal abnormality is seen in the liver or spleen. The stomach, duodenum, pancreas, gallbladder, and adrenal glands are unremarkable. No evidence for hydronephrosis or renal mass in either kidney. Tiny low-density cortical lesions in the kidneys bilaterally are too small to characterize but are stable and likely represent tiny cysts.  No abdominal aortic aneurysm. There is no free fluid or lymphadenopathy in the abdomen.  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  The patient has dilated small bowel loops tracking down in the central anatomic pelvis were there is mesenteric edema and interloop mesenteric fluid. Small bowel is dilated up to about 2.5 cm in diameter and can be followed to a segment the displays a small bowel feces sign immediately proximal to an abrupt transition zone. The abrupt transition zone is immediately anterior to the staple line of the Hartmann's pouch. The etiology for the obstruction at this level is most likely secondary to an adhesion. There is no overt small bowel wall thickening or gross hyper enhancement. Ileum distal to the transition zone is completely decompressed.  Bone windows reveal no worrisome lytic or sclerotic osseous lesions.  IMPRESSION: Distal small bowel obstruction with an abrupt transition zone in the mid anatomic pelvis, immediately anterior to the suture line at the blind end of the Hartmann's pouch. There is some associated perienteric edema and interloop mesenteric fluid. Imaging features are likely secondary to the presence of an adhesion.  I called these results to Dr. Anitra Lauth at approximately 1334 hours on 03/14/2013.   Electronically Signed   By: Kennith Center M.D.   On: 03/14/2013 13:35   Dg Chest Port 1 View  03/15/2013    CLINICAL DATA:  Pre-op baseline. Assess airspace disease. History of diabetes and Crohn's disease.  EXAM: PORTABLE CHEST - 1 VIEW  COMPARISON:  Chest radiographs 12/01/2011. CT chest 12/01/2011 and abdomen 03/14/2013.  FINDINGS: 1704 hr. Nasogastric tube projects below the diaphragm. There is mild atelectasis at both lung bases. No confluent airspace opacity or pleural effusion is seen. There is linear right perihilar scarring which is unchanged. The heart size and mediastinal contours are stable. There is fragmentation and collapse of the right humeral head consistent  with chronic avascular necrosis.  IMPRESSION: Mild bibasilar atelectasis. No consolidation or other significant changes identified.   Electronically Signed   By: Roxy Horseman M.D.   On: 03/15/2013 17:50   Dg Abd 2 Views  03/16/2013   CLINICAL DATA:  Small-bowel obstruction  EXAM: ABDOMEN - 2 VIEW  COMPARISON:  03/15/2013  FINDINGS: A nasogastric catheter is now noted within the stomach. There are embolization coils identified stable from the prior exam. Multiple dilated loops of small bowel are again identified. The overall appearance is slightly worse than that seen on the prior exam. This may be related in part due to more air as opposed of fluid within the bowel. Continued followup is recommended. No free air is seen. An ostomy is noted in the right lower quadrant.  IMPRESSION: Increase in the degree of gaseous distension although this may be related to some changes in the fluid dynamics within the small bowel. No free air is seen.   Electronically Signed   By: Alcide Clever M.D.   On: 03/16/2013 08:24   Dg Abd 2 Views  03/15/2013   CLINICAL DATA:  Small bowel obstruction. Distended abdomen. FOLLOW UP.  EXAM: ABDOMEN - 2 VIEW  COMPARISON:  03/14/2013  FINDINGS: Continued small bowel obstruction pattern with dilated small bowel loops and air-fluid levels. Suspect no real change since prior CT. No free air or organomegaly. No suspicious  calcifications. No acute bony abnormality. Lung bases are clear.  IMPRESSION: Stable small bowel obstruction pattern.   Electronically Signed   By: Charlett Nose M.D.   On: 03/15/2013 08:37    Jeoffrey Massed, MD  [facility] Pager:[phone]  If 7PM-7AM, please contact night-coverage [email or web address] Password [email or web address] 03/20/2013, 8:36 AM   LOS: 6 days

## 2013-03-20 NOTE — Progress Notes (Signed)
General Surgery Note  LOS: 6 days  POD -  4 Days Post-Op  Assessment/Plan: 1. REPAIR OF INCISIONAL AND PARASTOMAL HERNIAS, EXPLORATORY LAPAROTOMY,  LYSIS OF ADHESIONS FOR SMALL BOWEL OBSTRUCTION,  INSERTION OF BIOLOGIC MESH - 03/16/2013 - B. Hoxworth  Doing okay.  Feels better today.  To advance diet to pureed.    2.  MRSA bacteremia  2-d Echo okay - 03/18/2013  On Vanc  Seen by Dr. Staci Righter  For TEE tomorrow.  3.  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  COPD  5.  AODM TYPE II   Actually bld sugars have been low. 6.  CROHN'S , S/P ileosotomy and colectomy 2007  Hypertension  Rheumatoid Arthritis  EGD/balloon dilitation  Hx of normal cardiac cath 2009 On isolation because of MRSA  DVT prophylaxis - to start Lovenox   Active Problems:   DIABETES, TYPE 2   CROHN'S DISEASE   Anxiety disorder   Chronic respiratory failure   SBO (small bowel obstruction)   Hypoxemia   COPD exacerbation  Subjective:  Doing well.  Minimal discomfort.  Some stool in her ileostomy bag. Objective:   Filed Vitals:   03/20/13 0500  BP: 103/63  Pulse: 97  Temp: 98.8 F (37.1 C)  Resp: 12     Intake/Output from previous day:  10/25 0701 - 10/26 0700 In: 2144.2 [P.O.:240; I.V.:1754.2; IV Piggyback:150] Out: 450 [Urine:200; Stool:250]  Intake/Output this shift:      Physical Exam:   General: [name] who is alert and oriented.    HEENT: Normal. Pupils equal. .   Lungs: Clear   Abdomen: Soft.  Ostomy in right mid abdomen functioning.   Wound: Okay.  Will leave open.   Lab Results:     Recent Labs  03/19/13 0523 03/20/13 0503  WBC 11.8* 11.6*  HGB 7.8* 8.4*  HCT 24.7* 26.1*  PLT 156 176    BMET    Recent Labs  03/19/13 0523 03/20/13 0503  NA 142 141  K 3.3* 3.2*  CL 111 106  CO2 24 28  GLUCOSE 82 104*  BUN 9 6  CREATININE 0.86 0.82  CALCIUM 7.9* 8.0*    PT/INR  No results found for this basename: LABPROT, INR,  in the last 72  hours  ABG  No results found for this basename: PHART, PCO2, PO2, HCO3,  in the last 72 hours   Studies/Results:  No results found.   Anti-infectives:   Anti-infectives   Start     Dose/Rate Route Frequency Ordered Stop   03/18/13 0200  vancomycin (VANCOCIN) IVPB 750 mg/150 ml premix     750 mg 150 mL/hr over 60 Minutes Intravenous Every 12 hours 03/18/13 0126     03/16/13 0930  ertapenem (INVANZ) 1 g in sodium chloride 0.9 % 50 mL IVPB  Status:  Discontinued     1 g 100 mL/hr over 30 Minutes Intravenous Every 24 hours 03/16/13 0841 03/16/13 1548   03/14/13 2200  hydroxychloroquine (PLAQUENIL) tablet 200 mg  Status:  Discontinued     200 mg Oral 2 times daily 03/14/13 1624 03/16/13 1548      Ovidio Kin, MD, FACS Pager: [phone] [facility] Office: [phone] 03/20/2013

## 2013-03-20 NOTE — Progress Notes (Deleted)
General Surgery Note  LOS: 6 days  POD -  4 Days Post-Op  Assessment/Plan: 1. REPAIR OF INCISIONAL AND PARASTOMAL HERNIAS, EXPLORATORY LAPAROTOMY,  LYSIS OF ADHESIONS FOR SMALL BOWEL OBSTRUCTION,  INSERTION OF BIOLOGIC MESH - 03/16/2013 - B. Hoxworth  Doing okay.  To start clear liquids.   2.  MRSA bacteremia  2-d Echo okay - 03/18/2013  On Vanc  Seen by Dr. 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 CROHN'S , S/P ileosotomy and colectomy 2007  Hypertension  Rheumatoid Arthritis  EGD/balloon dilitation  Hx of normal cardiac cath 2009 On isolation because of MRSA  DVT prophylaxis - to start Lovenox   Active Problems:   DIABETES, TYPE 2   CROHN'S DISEASE   Anxiety disorder   Chronic respiratory failure   SBO (small bowel obstruction)   Hypoxemia   COPD exacerbation  Subjective:  Doing well.  Minimal discomfort.  Some stool in her ileostomy bag. Objective:   Filed Vitals:   03/20/13 0500  BP: 103/63  Pulse: 97  Temp: 98.8 F (37.1 C)  Resp: 12     Intake/Output from previous day:  10/25 0701 - 10/26 0700 In: 2144.2 [P.O.:240; I.V.:1754.2; IV Piggyback:150] Out: 450 [Urine:200; Stool:250]  Intake/Output this shift:      Physical Exam:   General: [name] who is alert and oriented.    HEENT: Normal. Pupils equal. .   Lungs: Clear   Abdomen: Soft.  Ostomy in right mid abdomen functioning.   Wound: Okay.  Will leave open.   Lab Results:     Recent Labs  03/19/13 0523 03/20/13 0503  WBC 11.8* 11.6*  HGB 7.8* 8.4*  HCT 24.7* 26.1*  PLT 156 176    BMET    Recent Labs  03/19/13 0523 03/20/13 0503  NA 142 141  K 3.3* 3.2*  CL 111 106  CO2 24 28  GLUCOSE 82 104*  BUN 9 6  CREATININE 0.86 0.82  CALCIUM 7.9* 8.0*    PT/INR  No results found for this basename: LABPROT, INR,  in the last 72 hours  ABG  No results found for this basename: PHART, PCO2, PO2,  HCO3,  in the last 72 hours   Studies/Results:  No results found.   Anti-infectives:   Anti-infectives   Start     Dose/Rate Route Frequency Ordered Stop   03/18/13 0200  vancomycin (VANCOCIN) IVPB 750 mg/150 ml premix     750 mg 150 mL/hr over 60 Minutes Intravenous Every 12 hours 03/18/13 0126     03/16/13 0930  ertapenem (INVANZ) 1 g in sodium chloride 0.9 % 50 mL IVPB  Status:  Discontinued     1 g 100 mL/hr over 30 Minutes Intravenous Every 24 hours 03/16/13 0841 03/16/13 1548   03/14/13 2200  hydroxychloroquine (PLAQUENIL) tablet 200 mg  Status:  Discontinued     200 mg Oral 2 times daily 03/14/13 1624 03/16/13 1548      Ovidio Kin, MD, FACS Pager: [phone] [facility] Office: [phone] 03/20/2013

## 2013-03-21 ENCOUNTER — Encounter (HOSPITAL_COMMUNITY): Payer: Self-pay | Admitting: *Deleted

## 2013-03-21 ENCOUNTER — Inpatient Hospital Stay (HOSPITAL_COMMUNITY): Payer: Medicare Other

## 2013-03-21 ENCOUNTER — Encounter (HOSPITAL_COMMUNITY)
Admission: EM | Disposition: A | Discharge status: Home / Self Care | Payer: Self-pay | Source: Home / Self Care | Attending: Internal Medicine

## 2013-03-21 DIAGNOSIS — R7881 Bacteremia: Secondary | ICD-10-CM

## 2013-03-21 DIAGNOSIS — A4902 Methicillin resistant Staphylococcus aureus infection, unspecified site: Secondary | ICD-10-CM

## 2013-03-21 DIAGNOSIS — R9389 Abnormal findings on diagnostic imaging of other specified body structures: Secondary | ICD-10-CM

## 2013-03-21 HISTORY — PX: TEE WITHOUT CARDIOVERSION: SHX5443

## 2013-03-21 LAB — URINE MICROSCOPIC-ADD ON

## 2013-03-21 LAB — BASIC METABOLIC PANEL
Calcium: 8.3 mg/dL — ABNORMAL LOW (ref 8.4–10.5)
Creatinine, Ser: 0.85 mg/dL (ref 0.50–1.10)
GFR calc Af Amer: 83 mL/min — ABNORMAL LOW (ref >90)
GFR calc non Af Amer: 71 mL/min — ABNORMAL LOW (ref >90)
Sodium: 141 mEq/L (ref 135–145)

## 2013-03-21 LAB — URINALYSIS, ROUTINE W REFLEX MICROSCOPIC
Bilirubin Urine: NEGATIVE
Hgb urine dipstick: NEGATIVE
Ketones, ur: 15 mg/dL — AB
Nitrite: NEGATIVE
Protein, ur: 30 mg/dL — AB
Specific Gravity, Urine: 1.015 (ref 1.005–1.030)

## 2013-03-21 LAB — VANCOMYCIN, TROUGH: Vancomycin Tr: 16.9 ug/mL (ref 10.0–20.0)

## 2013-03-21 LAB — GLUCOSE, CAPILLARY: Glucose-Capillary: 94 mg/dL (ref 70–99)

## 2013-03-21 SURGERY — ECHOCARDIOGRAM, TRANSESOPHAGEAL
Anesthesia: Moderate Sedation

## 2013-03-21 MED ORDER — MIDAZOLAM HCL 5 MG/ML IJ SOLN
INTRAMUSCULAR | Status: AC
  Filled 2013-03-21: qty 2

## 2013-03-21 MED ORDER — FENTANYL CITRATE 0.05 MG/ML IJ SOLN
INTRAMUSCULAR | Status: AC
  Filled 2013-03-21: qty 2

## 2013-03-21 MED ORDER — BUTAMBEN-TETRACAINE-BENZOCAINE 2-2-14 % EX AERO
INHALATION_SPRAY | CUTANEOUS | Status: DC | PRN
  Administered 2013-03-21: 2 via TOPICAL

## 2013-03-21 MED ORDER — MIDAZOLAM HCL 10 MG/2ML IJ SOLN
INTRAMUSCULAR | Status: DC | PRN
  Administered 2013-03-21 (×3): 1 mg via INTRAVENOUS
  Administered 2013-03-21: 2 mg via INTRAVENOUS

## 2013-03-21 MED ORDER — FENTANYL CITRATE 0.05 MG/ML IJ SOLN
INTRAMUSCULAR | Status: DC | PRN
  Administered 2013-03-21 (×2): 25 ug via INTRAVENOUS

## 2013-03-21 MED ORDER — POTASSIUM CHLORIDE CRYS ER 20 MEQ PO TBCR
20.0000 meq | EXTENDED_RELEASE_TABLET | Freq: Two times a day (BID) | ORAL | Status: DC
  Administered 2013-03-21 – 2013-03-22 (×3): 20 meq via ORAL
  Filled 2013-03-21 (×5): qty 1

## 2013-03-21 NOTE — Progress Notes (Signed)
  Echocardiogram Echocardiogram Transesophageal has been performed.  Jule Whitsel, [facility] 03/21/2013, 11:53 AM

## 2013-03-21 NOTE — Progress Notes (Signed)
5 Days Post-Op  Subjective: She is running low grade fevers, feels warm with temp 99.8 now.  She got a D1 diet yesterday, it tasted terrible, but she did well with it , emptied ileostomy twice yesterday.    Objective: Vital signs in last 24 hours: Temp:  [98.8 F (37.1 C)-100.1 F (37.8 C)] 99.5 F (37.5 C) (10/27 0537) Pulse Rate:  [92-103] 94 (10/27 0537) Resp:  [18] 18 (10/27 0537) BP: (106-122)/(66-75) 116/75 mmHg (10/27 0537) SpO2:  [94 %-100 %] 94 % (10/27 0537) Last BM Date: 03/21/13 480 PO recorded, 650 ml from ostomy Diet: currently NPO, Dysphagia I diet started yesterday. Tm 99.7, VSS, K+ 3.3 WBC was still up yesterday Intake/Output from previous day: 10/26 0701 - 10/27 0700 In: 1640.7 [P.O.:480; I.V.:1010.7; IV Piggyback:150] Out: 1050 [Urine:400; Stool:650] Intake/Output this shift: Total I/O In: -  Out: 250 [Stool:250]  General appearance: alert, cooperative and no distress Resp: BS down both bases GI: soft, tender, no pain med since 5 AM, awaiting TEE.+ BS, wound looks good ileostomy working well.  Lab Results:   Recent Labs  03/19/13 0523 03/20/13 0503  WBC 11.8* 11.6*  HGB 7.8* 8.4*  HCT 24.7* 26.1*  PLT 156 176    BMET  Recent Labs  03/20/13 0503 03/21/13 0742  NA 141 141  K 3.2* 3.3*  CL 106 106  CO2 28 28  GLUCOSE 104* 97  BUN 6 5*  CREATININE 0.82 0.85  CALCIUM 8.0* 8.3*   PT/INR No results found for this basename: LABPROT, INR,  in the last 72 hours   Recent Labs Lab 03/14/13 0940  AST 25  ALT 13  ALKPHOS 76  BILITOT 0.4  PROT 7.6  ALBUMIN 3.8     Lipase     Component Value Date/Time   LIPASE 23 03/14/2013 0940     Studies/Results: No results found.  Medications: . arformoterol  15 mcg Nebulization BID  . budesonide  0.25 mg Nebulization BID  . fluticasone  2 spray Each Nare Daily  . heparin subcutaneous  5,000 Units Subcutaneous Q8H  . insulin aspart  0-9 Units Subcutaneous TID WC  . tiotropium  18 mcg  Inhalation Daily  . vancomycin  750 mg Intravenous Q12H    Assessment/Plan 1. REPAIR OF INCISIONAL AND PARASTOMAL HERNIAS, EXPLORATORY LAPAROTOMY, LYSIS OF ADHESIONS FOR SMALL BOWEL OBSTRUCTION, INSERTION OF BIOLOGIC MESH - 03/16/2013 - B. Hoxworth  2. MRSA bacteremia  2-d Echo okay - 03/18/2013  On Vanc  Seen by Dr. Staci Righter  For TEE  3. 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 COPD  5. AODM TYPE II  Actually bld sugars have been low.  6. CROHN'S , S/P ileosotomy and colectomy 2007  Hypertension  Rheumatoid Arthritis (right humeral  head consistent with chronic avascular necrosis.) EGD/balloon dilitation  Hx of normal cardiac cath 2009  On isolation because of MRSA  DVT prophylaxis - to start Lovenox  Active Problems:  DIABETES, TYPE 2  CROHN'S DISEASE  Anxiety disorder  Chronic respiratory failure  SBO (small bowel obstruction)  Hypoxemia  COPD exacerbation   Plan:  Concerned with fever, ileostomy is working and I will let her go to low residual diet after TEE.  Check CXR, BS down both sides she is only doing IS once daily, mobilize more. Add some K+, CBC, BMP inAM She has had 3.5 days of vancomycin at this point.   LOS: 7 days    Kord Monette 03/21/2013

## 2013-03-21 NOTE — Progress Notes (Signed)
Patient ID: [name], female   DOB: Mar 29, 1951, 62 y.o.   MRN: [id number]  Plan: Continue vancomycin Chase TEE  Assessment: MRSA bacteremia resolving  Subjectively: She says she is doing well but had a low grade fever (100.1) yesterday. She continues to feel chills but says it is better than before. She has no other systemic complaints such as chest pain, back or joint pain..  Objectively: Last recorded: 10/27 0537   BP: 116/75 Pulse: 94  Temp: 99.5 F (37.5 C) Resp: 18  SpO2: 94     CVS: S1+S2+0 Resp: normal vesicular breathing, no added sounds Abdomen: soft, non tender, gs +ve Other exams normal   10/25 0523  10/26 0503  WBC 11.8 11.6 RBC 2.82 3.01 Hemoglobin 7.8 8.4 HCT 24.7 26.1 Platelets 156 176 Sodium 142 141 Potassium 3.3 3.2 Chloride 111 106 CO2 24 28 BUN 9 6 Creatinine 0.86 0.82 Calcium 7.9 8.0 Glucose 82 104   Culture, blood (routine x 2) [[id number]] Collected: 03/18/13 1630 Order Status: Completed Updated: 03/19/13 1104 Specimen Information: Blood Specimen Description BLOOD RIGHT HAND Special Requests BOTTLES DRAWN AEROBIC ONLY 10CC Culture Setup Time - Result: 03/18/2013 22:25 Performed at [facility] Culture - Result: BLOOD CULTURE RECEIVED NO GROWTH TO DATE CULTURE WILL BE HELD FOR 5 DAYS BEFORE ISSUING A FINAL NEGATIVE REPORT Performed at [facility] Report Status PENDING   10/25 TTE Study Conclusions  Left ventricle: The cavity size was normal. Wall thickness was normal. Systolic function was normal. The estimated ejection fraction was in the range of 55% to 65%. Wall motion was normal; there were no regional wall motion abnormalities. Left ventricular diastolic function parameters were normal. Transthoracic echocardiography. M-mode, complete 2D, spectral Doppler, and color Doppler. Height: Height: 154.9cm. Height: 61in. Weight: Weight: 65.3kg. Weight: 143.7lb. Body mass index: BMI: 27.2kg/m^2. Body surface area: BSA: 1.47m^2.  Blood pressure: 128/75. Patient status: Inpatient. Location: Bedside.

## 2013-03-21 NOTE — H&P (View-Only) (Signed)
Patient ID: [name], female   DOB: 06/21/1950, 62 y.o.   MRN: [id number]  Plan: Continue vancomycin Chase TEE  Assessment: MRSA bacteremia resolving  Subjectively: She says she is doing well but had a low grade fever (100.1) yesterday. She continues to feel chills but says it is better than before. She has no other systemic complaints such as chest pain, back or joint pain..  Objectively: Last recorded: 10/27 0537   BP: 116/75 Pulse: 94  Temp: 99.5 F (37.5 C) Resp: 18  SpO2: 94     CVS: S1+S2+0 Resp: normal vesicular breathing, no added sounds Abdomen: soft, non tender, gs +ve Other exams normal   10/25 0523  10/26 0503  WBC 11.8 11.6 RBC 2.82 3.01 Hemoglobin 7.8 8.4 HCT 24.7 26.1 Platelets 156 176 Sodium 142 141 Potassium 3.3 3.2 Chloride 111 106 CO2 24 28 BUN 9 6 Creatinine 0.86 0.82 Calcium 7.9 8.0 Glucose 82 104   Culture, blood (routine x 2) [[id number]] Collected: 03/18/13 1630 Order Status: Completed Updated: 03/19/13 1104 Specimen Information: Blood Specimen Description BLOOD RIGHT HAND Special Requests BOTTLES DRAWN AEROBIC ONLY 10CC Culture Setup Time - Result: 03/18/2013 22:25 Performed at [facility] Culture - Result: BLOOD CULTURE RECEIVED NO GROWTH TO DATE CULTURE WILL BE HELD FOR 5 DAYS BEFORE ISSUING A FINAL NEGATIVE REPORT Performed at [facility] Report Status PENDING   10/25 TTE Study Conclusions  Left ventricle: The cavity size was normal. Wall thickness was normal. Systolic function was normal. The estimated ejection fraction was in the range of 55% to 65%. Wall motion was normal; there were no regional wall motion abnormalities. Left ventricular diastolic function parameters were normal. Transthoracic echocardiography. M-mode, complete 2D, spectral Doppler, and color Doppler. Height: Height: 154.9cm. Height: 61in. Weight: Weight: 65.3kg. Weight: 143.7lb. Body mass index: BMI: 27.2kg/m^2. Body surface area: BSA: 1.64m^2.  Blood pressure: 128/75. Patient status: Inpatient. Location: Bedside.   

## 2013-03-21 NOTE — Progress Notes (Signed)
  [facility]  Date of Admission:  03/14/2013  Antibiotics: Antibiotics Given (last 72 hours)   Date/Time Action Medication Dose Rate   03/18/13 1457 Given   [MAR Hold] vancomycin (VANCOCIN) IVPB 750 mg/150 ml premix (On MAR Hold since 03/21/13 1042) 750 mg 150 mL/hr   03/19/13 0152 Given   [MAR Hold] vancomycin (VANCOCIN) IVPB 750 mg/150 ml premix (On MAR Hold since 03/21/13 1042) 750 mg 150 mL/hr   03/19/13 1436 Given   [MAR Hold] vancomycin (VANCOCIN) IVPB 750 mg/150 ml premix (On MAR Hold since 03/21/13 1042) 750 mg 150 mL/hr   03/20/13 0215 Given   [MAR Hold] vancomycin (VANCOCIN) IVPB 750 mg/150 ml premix (On MAR Hold since 03/21/13 1042) 750 mg 150 mL/hr   03/20/13 1308 Given   [MAR Hold] vancomycin (VANCOCIN) IVPB 750 mg/150 ml premix (On MAR Hold since 03/21/13 1042) 750 mg 150 mL/hr   03/21/13 0233 Given   [MAR Hold] vancomycin (VANCOCIN) IVPB 750 mg/150 ml premix (On MAR Hold since 03/21/13 1042) 750 mg 150 mL/hr      Subjective: Getting TEE now  Objective: Temp:  [98.8 F (37.1 C)-100.1 F (37.8 C)] 99.1 F (37.3 C) (10/27 1053) Pulse Rate:  [92-103] 94 (10/27 0537) Resp:  [13-18] 13 (10/27 1053) BP: (106-122)/(54-75) 111/54 mmHg (10/27 1053) SpO2:  [94 %-100 %] 100 % (10/27 1053)  Patient unavailable  Lab Results Lab Results  Component Value Date   WBC 11.6* 03/20/2013   HGB 8.4* 03/20/2013   HCT 26.1* 03/20/2013   MCV 86.7 03/20/2013   PLT 176 03/20/2013    Lab Results  Component Value Date   CREATININE 0.85 03/21/2013   BUN 5* 03/21/2013   NA 141 03/21/2013   K 3.3* 03/21/2013   CL 106 03/21/2013   CO2 28 03/21/2013    Lab Results  Component Value Date   ALT 13 03/14/2013   AST 25 03/14/2013   ALKPHOS 76 03/14/2013   BILITOT 0.4 03/14/2013      Microbiology: Recent Results (from the past 240 hour(s))  MRSA PCR SCREENING     Status: Abnormal   Collection Time    03/14/13  9:45 PM      Result Value Range  Status   MRSA by PCR POSITIVE (*) NEGATIVE Final   Comment:            The GeneXpert MRSA Assay (FDA     approved for NASAL specimens     only), is one component of a     comprehensive MRSA colonization     surveillance program. It is not     intended to diagnose MRSA     infection nor to guide or     monitor treatment for     MRSA infections.     RESULT CALLED TO, READ BACK BY AND VERIFIED WITH:     CALLED TO RN ARIEL MUHAMMAD [id number] @0041 [name]  CULTURE, BLOOD (ROUTINE X 2)     Status: None   Collection Time    03/16/13  6:23 AM      Result Value Range Status   Specimen Description BLOOD RIGHT HAND   Final   Special Requests BOTTLES DRAWN AEROBIC AND ANAEROBIC 10CC EACH   Final   Culture  Setup Time     Final   Value: 03/16/2013 11:09     Performed at [facility]   Culture     Final   Value:          BLOOD CULTURE RECEIVED NO GROWTH TO DATE CULTURE WILL BE HELD FOR 5 DAYS BEFORE ISSUING A FINAL NEGATIVE REPORT     Performed at [facility]   Report Status PENDING   Incomplete  CULTURE, BLOOD (ROUTINE X 2)     Status: None   Collection Time    03/16/13  6:33 AM      Result Value Range Status   Specimen Description BLOOD LEFT HAND   Final   Special Requests BOTTLES DRAWN AEROBIC ONLY 8CC   Final   Culture  Setup Time     Final   Value: 03/16/2013 11:09     Performed at [facility]   Culture     Final   Value: METHICILLIN RESISTANT STAPHYLOCOCCUS AUREUS     Note: RIFAMPIN AND GENTAMICIN SHOULD NOT BE USED AS SINGLE DRUGS FOR TREATMENT OF STAPH INFECTIONS. CRITICAL RESULT CALLED TO, READ BACK BY AND VERIFIED WITH: RACHEL LESPERANCE 03/18/13 1345 BY SMITHERSJ     Note: Gram Stain Report Called to,Read Back By and Verified With: DEANNA DILLON ON 03/18/2013 AT 12:30A BY WILEJ     Performed at [facility]   Report Status 03/19/2013 FINAL   Final   Organism ID, Bacteria METHICILLIN RESISTANT STAPHYLOCOCCUS AUREUS   Final  CULTURE, BLOOD (ROUTINE X  2)     Status: None   Collection Time    03/18/13  4:30 PM      Result Value Range Status   Specimen Description BLOOD RIGHT HAND   Final   Special Requests BOTTLES DRAWN AEROBIC ONLY 10CC   Final   Culture  Setup Time     Final   Value: 03/18/2013 22:25     Performed at [facility]   Culture     Final   Value:        BLOOD CULTURE RECEIVED NO GROWTH TO DATE CULTURE WILL BE HELD FOR 5 DAYS BEFORE ISSUING A FINAL NEGATIVE REPORT     Performed at [facility]   Report Status PENDING   Incomplete  CULTURE, BLOOD (ROUTINE X 2)     Status: None   Collection Time    03/18/13  4:45 PM      Result Value Range Status   Specimen Description BLOOD LEFT ARM   Final   Special Requests BOTTLES DRAWN AEROBIC ONLY 10CC   Final   Culture  Setup Time     Final   Value: 03/18/2013 22:26     Performed at [facility]   Culture     Final   Value:        BLOOD CULTURE RECEIVED NO GROWTH TO DATE CULTURE WILL BE HELD FOR 5 DAYS BEFORE ISSUING A FINAL NEGATIVE REPORT     Performed at [facility]   Report Status PENDING   Incomplete    Studies/Results: No results found.  Assessment/Plan: 1) MRSA  Bacteremia - if TEE negative for vegetation, can get 2 weeks of IV vancomycin through Nov 6th.  Repeat blood cultures negative.  -picc ok today  COMER, ROBERT, MD [email or web address] [phone] pager   [phone] cell 03/21/2013, 11:00 AM 

## 2013-03-21 NOTE — CV Procedure (Signed)
    Transesophageal Echocardiogram Note  LOUIE MEADERS [id number] 1950/09/08  Procedure: Transesophageal Echocardiogram Indications: MRSA bacteremia  Procedure Details Consent: Obtained Time Out: Verified patient identification, verified procedure, site/side was marked, verified correct patient position, special equipment/implants available, Radiology Safety Procedures followed,  medications/allergies/relevent history reviewed, required imaging and test results available.  Performed  Medications: Fentanyl: 50 mcg iv Versed: 5 mg iv  Left Ventrical:  Normal, hyperdynamic.  Mitral Valve: normal MV, no vegetation  Aortic Valve: normal , no vegetation  Tricuspid Valve: normal valve, no vegetatioin  Pulmonic Valve: normal.   Left Atrium/ Left atrial appendage: no thrombus  Atrial septum: inact, no PFO or ASD by color or bubble study  Aorta: normal   Complications: No apparent complications Patient did tolerate procedure well.   Vesta Mixer, Montez Hageman., MD, [facility] 03/21/2013, 11:36 AM

## 2013-03-21 NOTE — Progress Notes (Signed)
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 Advance diet  No acute surgical issues.  Wilmon Arms. Corliss Skains, MD, [facility]  General/ Trauma Surgery  03/21/2013 12:58 PM

## 2013-03-21 NOTE — Interval H&P Note (Signed)
History and Physical Interval Note:  03/21/2013 11:12 AM  [name]  has presented today for surgery, with the diagnosis of endocarditis  The various methods of treatment have been discussed with the patient and family. After consideration of risks, benefits and other options for treatment, the patient has consented to  Procedure(s): TRANSESOPHAGEAL ECHOCARDIOGRAM (TEE) (N/A) as a surgical intervention .  The patient's history has been reviewed, patient examined, no change in status, stable for surgery.  I have reviewed the patient's chart and labs.  Questions were answered to the patient's satisfaction.     Elyn Aquas.

## 2013-03-21 NOTE — Progress Notes (Signed)
ANTIBIOTIC CONSULT NOTE - FOLLOW UP  Pharmacy Consult for vancomycin Indication: MRSA bacteremia  Labs:  Recent Labs  03/18/13 0500 03/19/13 0523 03/20/13 0503  WBC 12.3* 11.8* 11.6*  HGB 7.8* 7.8* 8.4*  PLT 143* 156 176  CREATININE 0.84 0.86 0.82   Estimated Creatinine Clearance: 60.9 ml/min (by C-G formula based on Cr of 0.82).  Recent Labs  03/20/13 1405 03/21/13 0110  VANCOTROUGH 43.7* 16.9     Microbiology: Recent Results (from the past 720 hour(s))  MRSA PCR SCREENING     Status: Abnormal   Collection Time    03/14/13  9:45 PM      Result Value Range Status   MRSA by PCR POSITIVE (*) NEGATIVE Final   Comment:            The GeneXpert MRSA Assay (FDA     approved for NASAL specimens     only), is one component of a     comprehensive MRSA colonization     surveillance program. It is not     intended to diagnose MRSA     infection nor to guide or     monitor treatment for     MRSA infections.     RESULT CALLED TO, READ BACK BY AND VERIFIED WITH:     CALLED TO RN ARIEL MUHAMMAD [id number] @0041  [name]  CULTURE, BLOOD (ROUTINE X 2)     Status: None   Collection Time    03/16/13  6:23 AM      Result Value Range Status   Specimen Description BLOOD RIGHT HAND   Final   Special Requests BOTTLES DRAWN AEROBIC AND ANAEROBIC 10CC EACH   Final   Culture  Setup Time     Final   Value: 03/16/2013 11:09     Performed at [facility]   Culture     Final   Value:        BLOOD CULTURE RECEIVED NO GROWTH TO DATE CULTURE WILL BE HELD FOR 5 DAYS BEFORE ISSUING A FINAL NEGATIVE REPORT     Performed at [facility]   Report Status PENDING   Incomplete  CULTURE, BLOOD (ROUTINE X 2)     Status: None   Collection Time    03/16/13  6:33 AM      Result Value Range Status   Specimen Description BLOOD LEFT HAND   Final   Special Requests BOTTLES DRAWN AEROBIC ONLY 8CC   Final   Culture  Setup Time     Final   Value: 03/16/2013 11:09     Performed at [facility]   Culture     Final   Value: METHICILLIN RESISTANT STAPHYLOCOCCUS AUREUS     Note: RIFAMPIN AND GENTAMICIN SHOULD NOT BE USED AS SINGLE DRUGS FOR TREATMENT OF STAPH INFECTIONS. CRITICAL RESULT CALLED TO, READ BACK BY AND VERIFIED WITH: RACHEL LESPERANCE 03/18/13 1345 BY SMITHERSJ     Note: Gram Stain Report Called to,Read Back By and Verified With: DEANNA DILLON ON 03/18/2013 AT 12:30A BY Serafina Mitchell     Performed at [facility]   Report Status 03/19/2013 FINAL   Final   Organism ID, Bacteria METHICILLIN RESISTANT STAPHYLOCOCCUS AUREUS   Final  CULTURE, BLOOD (ROUTINE X 2)     Status: None   Collection Time    03/18/13  4:30 PM      Result Value Range Status   Specimen Description BLOOD RIGHT HAND   Final   Special Requests BOTTLES DRAWN  AEROBIC ONLY 10CC   Final   Culture  Setup Time     Final   Value: 03/18/2013 22:25     Performed at [facility]   Culture     Final   Value:        BLOOD CULTURE RECEIVED NO GROWTH TO DATE CULTURE WILL BE HELD FOR 5 DAYS BEFORE ISSUING A FINAL NEGATIVE REPORT     Performed at [facility]   Report Status PENDING   Incomplete  CULTURE, BLOOD (ROUTINE X 2)     Status: None   Collection Time    03/18/13  4:45 PM      Result Value Range Status   Specimen Description BLOOD LEFT ARM   Final   Special Requests BOTTLES DRAWN AEROBIC ONLY 10CC   Final   Culture  Setup Time     Final   Value: 03/18/2013 22:26     Performed at [facility]   Culture     Final   Value:        BLOOD CULTURE RECEIVED NO GROWTH TO DATE CULTURE WILL BE HELD FOR 5 DAYS BEFORE ISSUING A FINAL NEGATIVE REPORT     Performed at [facility]   Report Status PENDING   Incomplete    Anti-infectives   Start     Dose/Rate Route Frequency Ordered Stop   03/18/13 0200  vancomycin (VANCOCIN) IVPB 750 mg/150 ml premix     750 mg 150 mL/hr over 60 Minutes Intravenous Every 12 hours 03/18/13 0126     03/16/13 0930  ertapenem (INVANZ) 1 g in  sodium chloride 0.9 % 50 mL IVPB  Status:  Discontinued     1 g 100 mL/hr over 30 Minutes Intravenous Every 24 hours 03/16/13 0841 03/16/13 1548   03/14/13 2200  hydroxychloroquine (PLAQUENIL) tablet 200 mg  Status:  Discontinued     200 mg Oral 2 times daily 03/14/13 1624 03/16/13 1548      Assessment/Plan:  62yo female therapeutic on vancomycin with initial dosing for bacteremia that has now resulted as MRSA.  TTE negative for vegetation, awaiting TEE.  Will continue vancomycin at current dose and continue to monitor.  Vernard Gambles, PharmD, BCPS  03/21/2013,2:07 AM

## 2013-03-21 NOTE — Progress Notes (Signed)
PT Cancellation Note  Patient Details Name: MACIE BAUM MRN: [id number] DOB: 1951/01/24   Cancelled Treatment:    Reason Eval/Treat Not Completed: Other (comment) (Pt at TEE earlier and now sleepy and in pain.)   Karson Reede 03/21/2013, 2:41 PM

## 2013-03-21 NOTE — Progress Notes (Signed)
Called IV team and placed pt on list for PICC

## 2013-03-21 NOTE — Progress Notes (Signed)
PATIENT DETAILS Name: [name] Age: 62 y.o. Sex: female Date of Birth: 1951/05/05 Admit Date: 03/14/2013 Admitting Physician Kathlen Mody, MD RUE:[id number],XBJYN N, MD  Subjective: No major complaints-tolerating advancement in diet  Assessment/Plan: SBO  -etiology likely Adhesions vs. Crohn's  -Post-op-laparotomy for SBO - laparotomy done on 10/22.  - NG tube has removed,has liquid stool in the ileostomy,  we will defer further postop issues to the surgical team. Tolerating clear liquids.  MRSA Bacteremia -one set of blood cultures positive for MRSA, ID consult obtained, TTE no obvious vegetation, spoke with Leander cards on 10/26, for TEE 10/27 -c/w Vanco -day 4  Culture Data 1. Blood culture on 10/22-one set-positive for MRSA 2. 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 Appreciate pulmonary consultation.   Hypernatremia -2/2 to NPO, NG suction -resolved with hypotonic solution  Crohn's  -S/P total colectomy.  -Cared for by [facility].  - Stable   Anemia -?secondary to peri-operative blood loss, acute illness -monitor, follow H/H and transfuse prn  Diabetes  -A1C 5.6 yesterday  -On Victoza at home -currently on hold  -She is on sliding scale sensitive as she is NPO -CBG's stable   HTN  -Well controlled on Cardizem and Metoprolol-hold antihypertensives as her BP is stable (103/62) - we'll resume as needed   Anxiety  -Not well controlled-but better than last few days  -On ativan and celexa   Disposition: Remain inpatient-HHPT on discharge-suspect 10/28  DVT Prophylaxis: SCD's  Code Status: Full code   Family Communication None at bedside  Procedures:  Laparotomy on 10/22  CONSULTS:  GI and general surgery   MEDICATIONS: Scheduled Meds: . arformoterol  15 mcg Nebulization BID  . budesonide  0.25  mg Nebulization BID  . fluticasone  2 spray Each Nare Daily  . heparin subcutaneous  5,000 Units Subcutaneous Q8H  . insulin aspart  0-9 Units Subcutaneous TID WC  . tiotropium  18 mcg Inhalation Daily  . vancomycin  750 mg Intravenous Q12H   Continuous Infusions: . sodium chloride 40 mL/hr at 03/20/13 1004   PRN Meds:.acetaminophen, ketorolac, morphine injection, ondansetron (ZOFRAN) IV, phenol  Antibiotics: Anti-infectives   Start     Dose/Rate Route Frequency Ordered Stop   03/18/13 0200  vancomycin (VANCOCIN) IVPB 750 mg/150 ml premix     750 mg 150 mL/hr over 60 Minutes Intravenous Every 12 hours 03/18/13 0126     03/16/13 0930  ertapenem (INVANZ) 1 g in sodium chloride 0.9 % 50 mL IVPB  Status:  Discontinued     1 g 100 mL/hr over 30 Minutes Intravenous Every 24 hours 03/16/13 0841 03/16/13 1548   03/14/13 2200  hydroxychloroquine (PLAQUENIL) tablet 200 mg  Status:  Discontinued     200 mg Oral 2 times daily 03/14/13 1624 03/16/13 1548       PHYSICAL EXAM: Vital signs in last 24 hours: Filed Vitals:   03/20/13 1652 03/20/13 1801 03/20/13 2100 03/21/13 0537  BP:   122/74 116/75  Pulse:   92 94  Temp: 100.1 F (37.8 C) 99.8 F (37.7 C) 98.8 F (37.1 C) 99.5 F (37.5 C)  TempSrc: Oral Oral Oral Oral  Resp:   18 18  Height:      Weight:      SpO2:   100% 94%    Weight change:  Filed Weights   03/14/13 0930 03/14/13 1651  03/16/13 1542  Weight: 53.978 kg (119 lb) 54.432 kg (120 lb) 65.6 kg (144 lb 10 oz)   Body mass index is 27.34 kg/(m^2).   Gen Exam: Awake and alert with clear speech.   Neck: Supple, No JVD.   Chest: B/L Clear.   CVS: S1 S2 Regular, no murmurs.  Abdomen: soft, BS +-sluggish, dressing dry. Liquid stool in ileostomy. Staples in place-incision dry. Extremities: no edema, lower extremities warm to touch. Neurologic: Non Focal.   Skin: No Rash.   Wounds: N/A.    Intake/Output from previous day:  Intake/Output Summary (Last 24 hours) at  03/21/13 0846 Last data filed at 03/21/13 0745  Gross per 24 hour  Intake 1640.67 ml  Output   1300 ml  Net 340.67 ml     LAB RESULTS: CBC  Recent Labs Lab 03/14/13 0940 03/14/13 1700 03/17/13 0612 03/18/13 0500 03/19/13 0523 03/20/13 0503  WBC 9.3 8.6 8.9 12.3* 11.8* 11.6*  HGB 11.6* 10.0* 8.5* 7.8* 7.8* 8.4*  HCT 35.0* 30.3* 26.1* 24.1* 24.7* 26.1*  PLT 223 190 141* 143* 156 176  MCV 84.3 85.1 87.6 87.0 87.6 86.7  MCH 28.0 28.1 28.5 28.2 27.7 27.9  MCHC 33.1 33.0 32.6 32.4 31.6 32.2  RDW 12.9 13.1 13.8 13.4 13.4 13.3  LYMPHSABS 1.0  --   --   --   --   --   MONOABS 0.4  --   --   --   --   --   EOSABS 0.0  --   --   --   --   --   BASOSABS 0.0  --   --   --   --   --     Chemistries   Recent Labs Lab 03/16/13 0623 03/17/13 0612 03/18/13 0500 03/19/13 0523 03/20/13 0503  NA 149* 148* 151* 142 141  K 3.5 3.7 3.5 3.3* 3.2*  CL 116* 116* 117* 111 106  CO2 25 22 25 24 28   GLUCOSE 146* 170* 139* 82 104*  BUN 14 12 10 9 6   CREATININE 1.10 0.89 0.84 0.86 0.82  CALCIUM 8.4 7.7* 7.8* 7.9* 8.0*    CBG:  Recent Labs Lab 03/20/13 0805 03/20/13 1140 03/20/13 1723 03/20/13 2154 03/21/13 0757  GLUCAP 99 106* 101* 99 94    GFR Estimated Creatinine Clearance: 60.9 ml/min (by C-G formula based on Cr of 0.82).  Coagulation profile No results found for this basename: INR, PROTIME,  in the last 168 hours  Cardiac Enzymes No results found for this basename: CK, CKMB, TROPONINI, MYOGLOBIN,  in the last 168 hours  No components found with this basename: POCBNP,  No results found for this basename: DDIMER,  in the last 72 hours No results found for this basename: HGBA1C,  in the last 72 hours No results found for this basename: CHOL, HDL, LDLCALC, TRIG, CHOLHDL, LDLDIRECT,  in the last 72 hours No results found for this basename: TSH, T4TOTAL, FREET3, T3FREE, THYROIDAB,  in the last 72 hours No results found for this basename: VITAMINB12, FOLATE, FERRITIN, TIBC,  IRON, RETICCTPCT,  in the last 72 hours No results found for this basename: LIPASE, AMYLASE,  in the last 72 hours  Urine Studies No results found for this basename: UACOL, UAPR, USPG, UPH, UTP, UGL, UKET, UBIL, UHGB, UNIT, UROB, ULEU, UEPI, UWBC, URBC, UBAC, CAST, CRYS, UCOM, BILUA,  in the last 72 hours  MICROBIOLOGY: Recent Results (from the past 240 hour(s))  MRSA PCR SCREENING     Status:  Abnormal   Collection Time    03/14/13  9:45 PM      Result Value Range Status   MRSA by PCR POSITIVE (*) NEGATIVE Final   Comment:            The GeneXpert MRSA Assay (FDA     approved for NASAL specimens     only), is one component of a     comprehensive MRSA colonization     surveillance program. It is not     intended to diagnose MRSA     infection nor to guide or     monitor treatment for     MRSA infections.     RESULT CALLED TO, READ BACK BY AND VERIFIED WITH:     CALLED TO RN ARIEL MUHAMMAD [phone] @0041  [name]  CULTURE, BLOOD (ROUTINE X 2)     Status: None   Collection Time    03/16/13  6:23 AM      Result Value Range Status   Specimen Description BLOOD RIGHT HAND   Final   Special Requests BOTTLES DRAWN AEROBIC AND ANAEROBIC 10CC EACH   Final   Culture  Setup Time     Final   Value: 03/16/2013 11:09     Performed at [facility]   Culture     Final   Value:        BLOOD CULTURE RECEIVED NO GROWTH TO DATE CULTURE WILL BE HELD FOR 5 DAYS BEFORE ISSUING A FINAL NEGATIVE REPORT     Performed at [facility]   Report Status PENDING   Incomplete  CULTURE, BLOOD (ROUTINE X 2)     Status: None   Collection Time    03/16/13  6:33 AM      Result Value Range Status   Specimen Description BLOOD LEFT HAND   Final   Special Requests BOTTLES DRAWN AEROBIC ONLY 8CC   Final   Culture  Setup Time     Final   Value: 03/16/2013 11:09     Performed at [facility]   Culture     Final   Value: METHICILLIN RESISTANT STAPHYLOCOCCUS AUREUS     Note: RIFAMPIN AND  GENTAMICIN SHOULD NOT BE USED AS SINGLE DRUGS FOR TREATMENT OF STAPH INFECTIONS. CRITICAL RESULT CALLED TO, READ BACK BY AND VERIFIED WITH: RACHEL LESPERANCE 03/18/13 1345 BY SMITHERSJ     Note: Gram Stain Report Called to,Read Back By and Verified With: DEANNA DILLON ON 03/18/2013 AT 12:30A BY WILEJ     Performed at [facility]   Report Status 03/19/2013 FINAL   Final   Organism ID, Bacteria METHICILLIN RESISTANT STAPHYLOCOCCUS AUREUS   Final  CULTURE, BLOOD (ROUTINE X 2)     Status: None   Collection Time    03/18/13  4:30 PM      Result Value Range Status   Specimen Description BLOOD RIGHT HAND   Final   Special Requests BOTTLES DRAWN AEROBIC ONLY 10CC   Final   Culture  Setup Time     Final   Value: 03/18/2013 22:25     Performed at [facility]   Culture     Final   Value:        BLOOD CULTURE RECEIVED NO GROWTH TO DATE CULTURE WILL BE HELD FOR 5 DAYS BEFORE ISSUING A FINAL NEGATIVE REPORT     Performed at [facility]   Report Status PENDING   Incomplete  CULTURE, BLOOD (ROUTINE X 2)  Status: None   Collection Time    03/18/13  4:45 PM      Result Value Range Status   Specimen Description BLOOD LEFT ARM   Final   Special Requests BOTTLES DRAWN AEROBIC ONLY 10CC   Final   Culture  Setup Time     Final   Value: 03/18/2013 22:26     Performed at [facility]   Culture     Final   Value:        BLOOD CULTURE RECEIVED NO GROWTH TO DATE CULTURE WILL BE HELD FOR 5 DAYS BEFORE ISSUING A FINAL NEGATIVE REPORT     Performed at [facility]   Report Status PENDING   Incomplete    RADIOLOGY STUDIES/RESULTS: Ct Abdomen Pelvis W Contrast  03/14/2013   CLINICAL DATA:  Diffuse abdominal pain. Nausea and vomiting. History of Crohn disease.  EXAM: CT ABDOMEN AND PELVIS WITH CONTRAST  TECHNIQUE: Multidetector CT imaging of the abdomen and pelvis was performed using the standard protocol following bolus administration of intravenous contrast.   CONTRAST:  OMNIPAQUE IOHEXOL 300 MG/ML  SOLN  COMPARISON:  [facility] CT ABDOMEN AND PELVIS WITH CONTRAST from 03/02/2012.  FINDINGS: No focal abnormality is seen in the liver or spleen. The stomach, duodenum, pancreas, gallbladder, and adrenal glands are unremarkable. No evidence for hydronephrosis or renal mass in either kidney. Tiny low-density cortical lesions in the kidneys bilaterally are too small to characterize but are stable and likely represent tiny cysts.  No abdominal aortic aneurysm. There is no free fluid or lymphadenopathy in the abdomen.  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  The patient has dilated small bowel loops tracking down in the central anatomic pelvis were there is mesenteric edema and interloop mesenteric fluid. Small bowel is dilated up to about 2.5 cm in diameter and can be followed to a segment the displays a small bowel feces sign immediately proximal to an abrupt transition zone. The abrupt transition zone is immediately anterior to the staple line of the Hartmann's pouch. The etiology for the obstruction at this level is most likely secondary to an adhesion. There is no overt small bowel wall thickening or gross hyper enhancement. Ileum distal to the transition zone is completely decompressed.  Bone windows reveal no worrisome lytic or sclerotic osseous lesions.  IMPRESSION: Distal small bowel obstruction with an abrupt transition zone in the mid anatomic pelvis, immediately anterior to the suture line at the blind end of the Hartmann's pouch. There is some associated perienteric edema and interloop mesenteric fluid. Imaging features are likely secondary to the presence of an adhesion.  I called these results to Dr. Anitra Lauth at approximately 1334 hours on 03/14/2013.   Electronically Signed   By: Kennith Center M.D.   On:  03/14/2013 13:35   Dg Chest Port 1 View  03/15/2013   CLINICAL DATA:  Pre-op baseline. Assess airspace disease. History of diabetes and Crohn's disease.  EXAM: PORTABLE CHEST - 1 VIEW  COMPARISON:  Chest radiographs 12/01/2011. CT chest 12/01/2011 and abdomen 03/14/2013.  FINDINGS: 1704 hr. Nasogastric tube projects below the diaphragm. There is mild atelectasis at both lung bases. No confluent airspace opacity or pleural effusion is seen. There is linear right perihilar scarring which is unchanged. The heart size and mediastinal contours are stable. There is fragmentation and collapse of the right humeral head  consistent with chronic avascular necrosis.  IMPRESSION: Mild bibasilar atelectasis. No consolidation or other significant changes identified.   Electronically Signed   By: Roxy Horseman M.D.   On: 03/15/2013 17:50   Dg Abd 2 Views  03/16/2013   CLINICAL DATA:  Small-bowel obstruction  EXAM: ABDOMEN - 2 VIEW  COMPARISON:  03/15/2013  FINDINGS: A nasogastric catheter is now noted within the stomach. There are embolization coils identified stable from the prior exam. Multiple dilated loops of small bowel are again identified. The overall appearance is slightly worse than that seen on the prior exam. This may be related in part due to more air as opposed of fluid within the bowel. Continued followup is recommended. No free air is seen. An ostomy is noted in the right lower quadrant.  IMPRESSION: Increase in the degree of gaseous distension although this may be related to some changes in the fluid dynamics within the small bowel. No free air is seen.   Electronically Signed   By: Alcide Clever M.D.   On: 03/16/2013 08:24   Dg Abd 2 Views  03/15/2013   CLINICAL DATA:  Small bowel obstruction. Distended abdomen. FOLLOW UP.  EXAM: ABDOMEN - 2 VIEW  COMPARISON:  03/14/2013  FINDINGS: Continued small bowel obstruction pattern with dilated small bowel loops and air-fluid levels. Suspect no real change since  prior CT. No free air or organomegaly. No suspicious calcifications. No acute bony abnormality. Lung bases are clear.  IMPRESSION: Stable small bowel obstruction pattern.   Electronically Signed   By: Charlett Nose M.D.   On: 03/15/2013 08:37    Jeoffrey Massed, MD  [facility] Pager:[phone]  If 7PM-7AM, please contact night-coverage [email or web address] Password [email or web address] 03/21/2013, 8:46 AM   LOS: 7 days

## 2013-03-21 NOTE — H&P (View-Only) (Signed)
[facility]  Date of Admission:  03/14/2013  Antibiotics: Antibiotics Given (last 72 hours)   Date/Time Action Medication Dose Rate   03/18/13 1457 Given   [MAR Hold] vancomycin (VANCOCIN) IVPB 750 mg/150 ml premix (On MAR Hold since 03/21/13 1042) 750 mg 150 mL/hr   03/19/13 0152 Given   [MAR Hold] vancomycin (VANCOCIN) IVPB 750 mg/150 ml premix (On MAR Hold since 03/21/13 1042) 750 mg 150 mL/hr   03/19/13 1436 Given   [MAR Hold] vancomycin (VANCOCIN) IVPB 750 mg/150 ml premix (On MAR Hold since 03/21/13 1042) 750 mg 150 mL/hr   03/20/13 0215 Given   [MAR Hold] vancomycin (VANCOCIN) IVPB 750 mg/150 ml premix (On MAR Hold since 03/21/13 1042) 750 mg 150 mL/hr   03/20/13 1308 Given   [MAR Hold] vancomycin (VANCOCIN) IVPB 750 mg/150 ml premix (On MAR Hold since 03/21/13 1042) 750 mg 150 mL/hr   03/21/13 0233 Given   [MAR Hold] vancomycin (VANCOCIN) IVPB 750 mg/150 ml premix (On MAR Hold since 03/21/13 1042) 750 mg 150 mL/hr      Subjective: Getting TEE now  Objective: Temp:  [98.8 F (37.1 C)-100.1 F (37.8 C)] 99.1 F (37.3 C) (10/27 1053) Pulse Rate:  [92-103] 94 (10/27 0537) Resp:  [13-18] 13 (10/27 1053) BP: (106-122)/(54-75) 111/54 mmHg (10/27 1053) SpO2:  [94 %-100 %] 100 % (10/27 1053)  Patient unavailable  Lab Results Lab Results  Component Value Date   WBC 11.6* 03/20/2013   HGB 8.4* 03/20/2013   HCT 26.1* 03/20/2013   MCV 86.7 03/20/2013   PLT 176 03/20/2013    Lab Results  Component Value Date   CREATININE 0.85 03/21/2013   BUN 5* 03/21/2013   NA 141 03/21/2013   K 3.3* 03/21/2013   CL 106 03/21/2013   CO2 28 03/21/2013    Lab Results  Component Value Date   ALT 13 03/14/2013   AST 25 03/14/2013   ALKPHOS 76 03/14/2013   BILITOT 0.4 03/14/2013      Microbiology: Recent Results (from the past 240 hour(s))  MRSA PCR SCREENING     Status: Abnormal   Collection Time    03/14/13  9:45 PM      Result Value Range  Status   MRSA by PCR POSITIVE (*) NEGATIVE Final   Comment:            The GeneXpert MRSA Assay (FDA     approved for NASAL specimens     only), is one component of a     comprehensive MRSA colonization     surveillance program. It is not     intended to diagnose MRSA     infection nor to guide or     monitor treatment for     MRSA infections.     RESULT CALLED TO, READ BACK BY AND VERIFIED WITH:     CALLED TO RN ARIEL MUHAMMAD [id number] @0041  [name]  CULTURE, BLOOD (ROUTINE X 2)     Status: None   Collection Time    03/16/13  6:23 AM      Result Value Range Status   Specimen Description BLOOD RIGHT HAND   Final   Special Requests BOTTLES DRAWN AEROBIC AND ANAEROBIC 10CC EACH   Final   Culture  Setup Time     Final   Value: 03/16/2013 11:09     Performed at [facility]   Culture     Final   Value:  BLOOD CULTURE RECEIVED NO GROWTH TO DATE CULTURE WILL BE HELD FOR 5 DAYS BEFORE ISSUING A FINAL NEGATIVE REPORT     Performed at [facility]   Report Status PENDING   Incomplete  CULTURE, BLOOD (ROUTINE X 2)     Status: None   Collection Time    03/16/13  6:33 AM      Result Value Range Status   Specimen Description BLOOD LEFT HAND   Final   Special Requests BOTTLES DRAWN AEROBIC ONLY 8CC   Final   Culture  Setup Time     Final   Value: 03/16/2013 11:09     Performed at [facility]   Culture     Final   Value: METHICILLIN RESISTANT STAPHYLOCOCCUS AUREUS     Note: RIFAMPIN AND GENTAMICIN SHOULD NOT BE USED AS SINGLE DRUGS FOR TREATMENT OF STAPH INFECTIONS. CRITICAL RESULT CALLED TO, READ BACK BY AND VERIFIED WITH: RACHEL LESPERANCE 03/18/13 1345 BY SMITHERSJ     Note: Gram Stain Report Called to,Read Back By and Verified With: DEANNA DILLON ON 03/18/2013 AT 12:30A BY WILEJ     Performed at [facility]   Report Status 03/19/2013 FINAL   Final   Organism ID, Bacteria METHICILLIN RESISTANT STAPHYLOCOCCUS AUREUS   Final  CULTURE, BLOOD (ROUTINE X  2)     Status: None   Collection Time    03/18/13  4:30 PM      Result Value Range Status   Specimen Description BLOOD RIGHT HAND   Final   Special Requests BOTTLES DRAWN AEROBIC ONLY 10CC   Final   Culture  Setup Time     Final   Value: 03/18/2013 22:25     Performed at [facility]   Culture     Final   Value:        BLOOD CULTURE RECEIVED NO GROWTH TO DATE CULTURE WILL BE HELD FOR 5 DAYS BEFORE ISSUING A FINAL NEGATIVE REPORT     Performed at [facility]   Report Status PENDING   Incomplete  CULTURE, BLOOD (ROUTINE X 2)     Status: None   Collection Time    03/18/13  4:45 PM      Result Value Range Status   Specimen Description BLOOD LEFT ARM   Final   Special Requests BOTTLES DRAWN AEROBIC ONLY 10CC   Final   Culture  Setup Time     Final   Value: 03/18/2013 22:26     Performed at [facility]   Culture     Final   Value:        BLOOD CULTURE RECEIVED NO GROWTH TO DATE CULTURE WILL BE HELD FOR 5 DAYS BEFORE ISSUING A FINAL NEGATIVE REPORT     Performed at [facility]   Report Status PENDING   Incomplete    Studies/Results: No results found.  Assessment/Plan: 1) MRSA  Bacteremia - if TEE negative for vegetation, can get 2 weeks of IV vancomycin through Nov 6th.  Repeat blood cultures negative.  -picc ok today  COMER, Molly Maduro, MD [facility] [email or web address] [phone] pager   [phone] cell 03/21/2013, 11:00 AM

## 2013-03-22 ENCOUNTER — Encounter (HOSPITAL_COMMUNITY): Payer: Self-pay | Admitting: Cardiovascular Disease

## 2013-03-22 LAB — BASIC METABOLIC PANEL
BUN: 6 mg/dL (ref 6–23)
Calcium: 8.4 mg/dL (ref 8.4–10.5)
Chloride: 104 mEq/L (ref 96–112)
Creatinine, Ser: 0.93 mg/dL (ref 0.50–1.10)
GFR calc Af Amer: 74 mL/min — ABNORMAL LOW (ref >90)
Glucose, Bld: 142 mg/dL — ABNORMAL HIGH (ref 70–99)
Potassium: 3.6 mEq/L (ref 3.5–5.1)
Sodium: 139 mEq/L (ref 135–145)

## 2013-03-22 LAB — CULTURE, BLOOD (ROUTINE X 2): Culture: NO GROWTH

## 2013-03-22 LAB — CBC
HCT: 23 % — ABNORMAL LOW (ref 36.0–46.0)
Hemoglobin: 7.5 g/dL — ABNORMAL LOW (ref 12.0–15.0)
MCV: 86.5 fL (ref 78.0–100.0)
RDW: 13.3 % (ref 11.5–15.5)
WBC: 10.8 10*3/uL — ABNORMAL HIGH (ref 4.0–10.5)

## 2013-03-22 LAB — URINE CULTURE: Colony Count: NO GROWTH

## 2013-03-22 LAB — GLUCOSE, CAPILLARY: Glucose-Capillary: 107 mg/dL — ABNORMAL HIGH (ref 70–99)

## 2013-03-22 MED ORDER — VANCOMYCIN HCL IN DEXTROSE 750-5 MG/150ML-% IV SOLN: 750.0000 mg | Freq: Two times a day (BID) | INTRAVENOUS | Status: DC

## 2013-03-22 MED ORDER — SODIUM CHLORIDE 0.9 % IJ SOLN: 10.0000 mL | INTRAMUSCULAR | Status: DC | PRN

## 2013-03-22 MED ORDER — HYDROCODONE-ACETAMINOPHEN 5-325 MG PO TABS: 1.0000 | ORAL_TABLET | Freq: Four times a day (QID) | ORAL | Status: DC | PRN

## 2013-03-22 MED ORDER — SIMETHICONE 40 MG/0.6ML PO SUSP
40.0000 mg | Freq: Four times a day (QID) | ORAL | Status: DC | PRN
  Filled 2013-03-22: qty 0.6

## 2013-03-22 NOTE — Progress Notes (Signed)
Patient ID: [name], female   DOB: [birth year], 62 y.o.   MRN: [id number]  Plan: Discharge on vancomycin  Assessment: MRSA bacteremia  Subjectively: She is doing well and has no active complaints such as fever, chills, sweats, back or joint pain.   Objectively:  Last recorded: 10/28 0519   BP: 105/66 Pulse: 93  Temp: 98.9 F (37.2 C) Resp: 18  SpO2: 98     CVS: S1+S2+0 Resp: NVB B/L Abdomen, soft, td, gs +ve Other exams normal   Selected Labs (Up to last 3 results from past 72 hours) Report       10/26 0503   10/27 0742   10/28 0435      WBC 11.6     10.8      RBC 3.01     2.66      Hemoglobin 8.4     7.5      HCT 26.1     23.0      Platelets 176    201     Sodium 141  141  139     Potassium 3.2   3.3   3.6     Chloride 106  106  104     CO2 28  28  27      BUN 6  5   6      Creatinine 0.82  0.85  0.93     Calcium 8.0   8.3   8.4     Glucose 104   97  142      10/27 CXR IMPRESSION:  Emphysematous changes with left lower lobe atelectasis versus  consolidation and tiny left pleural effusion.  Culture, blood (routine x 2) [[id number]] Collected: 03/16/13 0623 Updated: 03/22/13 0811 Specimen Type: Blood Specimen Description BLOOD RIGHT HAND Special Requests Result: BOTTLES DRAWN AEROBIC AND ANAEROBIC 10CC EACH Culture Setup Time - Result: 03/16/2013 11:09 Performed at [facility] Culture - Result: NO GROWTH 5 DAYS Performed at [facility] Report Status 03/22/2013 FINAL

## 2013-03-22 NOTE — Progress Notes (Signed)
PT Cancellation Note  Patient Details Name: [name] MRN: [id number] DOB: 12-May-1951   Cancelled Treatment:    Reason Eval/Treat Not Completed: Patient at procedure or test/unavailable. Patient getting PICC line at this time. Will follow up as time allows. Patient is planning to DC after PICC line placement   [name], Adline Potter 03/22/2013, 1:32 PM

## 2013-03-22 NOTE — Progress Notes (Signed)
Peripherally Inserted Central Catheter/Midline Placement  The IV Nurse has discussed with the patient and/or persons authorized to consent for the patient, the purpose of this procedure and the potential benefits and risks involved with this procedure.  The benefits include less needle sticks, lab draws from the catheter and patient may be discharged home with the catheter.  Risks include, but not limited to, infection, bleeding, blood clot (thrombus formation), and puncture of an artery; nerve damage and irregular heat beat.  Alternatives to this procedure were also discussed.  PICC/Midline Placement Documentation        Stacie Glaze Horton 03/22/2013, 1:43 PM

## 2013-03-22 NOTE — Progress Notes (Signed)
Called IV team regarding PICC placement and stated pt was getting discharged today. IV RN stated she would call me back with information regarding the time.

## 2013-03-22 NOTE — Discharge Summary (Signed)
PATIENT DETAILS Name: [name] Age: 62 y.o. Sex: female Date of Birth: Jan 02, 1951 MRN: [id number]. Admit Date: 03/14/2013 Admitting Physician: Kathlen Mody, MD WUJ:[id number],WGNFA N, MD  Recommendations for Outpatient Follow-up:  1. Continue with IV vancomycin for 9 more days from 10/28. 2. Gen. health maintenance  PRIMARY DISCHARGE DIAGNOSIS:  Active Problems:   DIABETES, TYPE 2   CROHN'S DISEASE   Anxiety disorder   Chronic respiratory failure   SBO (small bowel obstruction)   Hypoxemia   COPD exacerbation   MRSA bacteremia     PAST MEDICAL HISTORY: Past Medical History  Diagnosis Date  . Other diseases of vocal cords   . Osteoporosis, unspecified   . Other and unspecified hyperlipidemia   . Esophageal reflux   . Chronic airway obstruction, not elsewhere classified   . Regional enteritis of unspecified site   . 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 Type II or unspecified type diabetes mellitus without mention of complication, not stated as uncontrolled   . Crohn's disease     Ileostomy  . Pelvic adhesions   . Anxiety   . Depression   . Hypertension   . Shortness of breath   . Pneumonia   . Asthma   . Emphysema   . Hematuria   . Rheumatoid arthritis(714.0) 03/2012    [place]  . Severe cervical dysplasia age 52    cone biopsy with neg paps since    DISCHARGE MEDICATIONS:   Medication List         AEROCHAMBER PLUS inhaler  Use as instructed     arformoterol 15 MCG/2ML Nebu  Commonly known as:  BROVANA  Take 2 mLs (15 mcg total) by nebulization 2 (two) times daily.     aspirin 81 MG tablet  Take 81 mg by mouth every other day.     budesonide 0.25 MG/2ML nebulizer solution  Commonly known as:  PULMICORT  Take 2 mLs (0.25 mg total) by nebulization 2 (two) times daily. Dx 496     celecoxib 200 MG capsule  Commonly known as:  CELEBREX  Take 200 mg by mouth daily.     citalopram 20 MG tablet  Commonly known as:  CELEXA   Take 20 mg by mouth daily.     diltiazem 180 MG 24 hr capsule  Commonly known as:  CARDIZEM CD  Take 1 capsule (180 mg total) by mouth 2 (two) times daily.     famotidine 20 MG tablet  Commonly known as:  PEPCID  Take 1 tablet (20 mg total) by mouth 2 (two) times daily.     fluticasone 50 MCG/ACT nasal spray  Commonly known as:  FLONASE  Place 2 sprays into the nose daily.     furosemide 20 MG tablet  Commonly known as:  LASIX  TAKE 1 TABLET TWICE A DAY AS NEEDED     guaiFENesin 600 MG 12 hr tablet  Commonly known as:  MUCINEX  Take 1,200 mg by mouth 2 (two) times daily.     HYDROcodone-acetaminophen 5-325 MG per tablet  Commonly known as:  NORCO/VICODIN  Take 1 tablet by mouth every 6 (six) hours as needed for pain.     hydroxychloroquine 200 MG tablet  Commonly known as:  PLAQUENIL  Take 1 tablet by mouth Twice daily.     IRON PO  Take 1 tablet by mouth every Monday, Wednesday, and Friday.     LORazepam 0.5 MG tablet  Commonly known as:  ATIVAN  Take 1  tablet (0.5 mg total) by mouth every 8 (eight) hours as needed for anxiety.     metoprolol tartrate 25 MG tablet  Commonly known as:  LOPRESSOR  Take 0.5 tablets (12.5 mg total) by mouth 2 (two) times daily.     multivitamin with minerals Tabs tablet  Take 1 tablet by mouth daily.     omeprazole 40 MG capsule  Commonly known as:  PRILOSEC  Take 40 mg by mouth 2 (two) times daily.     OXYGEN-HELIUM IN  Inhale 2.5-3 L into the lungs.     rosuvastatin 5 MG tablet  Commonly known as:  CRESTOR  Take 5 mg by mouth every Monday, Wednesday, and Friday.     tiotropium 18 MCG inhalation capsule  Commonly known as:  SPIRIVA  Place 1 capsule (18 mcg total) into inhaler and inhale daily.     Vancomycin 750 MG/150ML Soln  Commonly known as:  VANCOCIN  Inject 150 mLs (750 mg total) into the vein every 12 (twelve) hours. Continue for 9 more days from 03/22/13     VICTOZA 18 MG/3ML Soln injection  Generic drug:   Liraglutide  1.8 units once daily     Vitamin D 1000 UNITS capsule  Take 1,000 Units by mouth daily.        ALLERGIES:   Allergies  Allergen Reactions  . Esomeprazole Magnesium Other (See Comments)    NEXIUM - reaction > aggravated pt's Crohn's disease  . Other     Beans, Dander/Dust, Peas, Mushrooms  . Peanut-Containing Drug Products Other (See Comments)    unknown  . Shellfish Allergy Other (See Comments)    unknown  . Surgical Lubricant Other (See Comments)    Burns skin     BRIEF HPI:  See H&P, Labs, Consult and Test reports for all details in brief,[name] is a 62 y.o. female with h/o crohn's disease, COPD on oxygen, s/p ileostomy and colectomy in 2007, came in for worsening abdominal pain associated with nausea and vomiting since this morning from 1 am. On arrival ED, she underwent CT abd revealing SBO  CONSULTATIONS:   pulmonary/intensive care, GI and general surgery  PERTINENT RADIOLOGIC STUDIES: Dg Chest 2 View  03/21/2013   CLINICAL DATA:  Postoperative fever, post exploratory laparotomy with lysis of adhesions and mesh ventral hernia repair on 03/16/2013  EXAM: CHEST  2 VIEW  COMPARISON:  03/15/2013  FINDINGS: Normal heart size, mediastinal contours, and pulmonary vascularity.  Atelectasis versus consolidation in medial left lower lobe.  Lungs appear emphysematous but otherwise clear.  Tiny left pleural effusion.  No pneumothorax.  Bones unremarkable.  Interval removal of nasogastric tube.  IMPRESSION: Emphysematous changes with left lower lobe atelectasis versus consolidation and tiny left pleural effusion.   Electronically Signed   By: Ulyses Southward M.D.   On: 03/21/2013 17:33   Ct Abdomen Pelvis W Contrast  03/14/2013   CLINICAL DATA:  Diffuse abdominal pain. Nausea and vomiting. History of Crohn disease.  EXAM: CT ABDOMEN AND PELVIS WITH CONTRAST  TECHNIQUE: Multidetector CT imaging of the abdomen and pelvis was performed using the standard protocol  following bolus administration of intravenous contrast.  CONTRAST:  OMNIPAQUE IOHEXOL 300 MG/ML  SOLN  COMPARISON:  [facility] CT ABDOMEN AND PELVIS WITH CONTRAST from 03/02/2012.  FINDINGS: No focal abnormality is seen in the liver or spleen. The stomach, duodenum, pancreas, gallbladder, and adrenal glands are unremarkable. No evidence for hydronephrosis or renal mass in either kidney. Tiny low-density  cortical lesions in the kidneys bilaterally are too small to characterize but are stable and likely represent tiny cysts.  No abdominal aortic aneurysm. There is no free fluid or lymphadenopathy in the abdomen.  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  The patient has dilated small bowel loops tracking down in the central anatomic pelvis were there is mesenteric edema and interloop mesenteric fluid. Small bowel is dilated up to about 2.5 cm in diameter and can be followed to a segment the displays a small bowel feces sign immediately proximal to an abrupt transition zone. The abrupt transition zone is immediately anterior to the staple line of the Hartmann's pouch. The etiology for the obstruction at this level is most likely secondary to an adhesion. There is no overt small bowel wall thickening or gross hyper enhancement. Ileum distal to the transition zone is completely decompressed.  Bone windows reveal no worrisome lytic or sclerotic osseous lesions.  IMPRESSION: Distal small bowel obstruction with an abrupt transition zone in the mid anatomic pelvis, immediately anterior to the suture line at the blind end of the Hartmann's pouch. There is some associated perienteric edema and interloop mesenteric fluid. Imaging features are likely secondary to the presence of an adhesion.  I called these results to Dr. Anitra Lauth at approximately 1334 hours on 03/14/2013.    Electronically Signed   By: Kennith Center M.D.   On: 03/14/2013 13:35   Dg Chest Port 1 View  03/15/2013   CLINICAL DATA:  Pre-op baseline. Assess airspace disease. History of diabetes and Crohn's disease.  EXAM: PORTABLE CHEST - 1 VIEW  COMPARISON:  Chest radiographs 12/01/2011. CT chest 12/01/2011 and abdomen 03/14/2013.  FINDINGS: 1704 hr. Nasogastric tube projects below the diaphragm. There is mild atelectasis at both lung bases. No confluent airspace opacity or pleural effusion is seen. There is linear right perihilar scarring which is unchanged. The heart size and mediastinal contours are stable. There is fragmentation and collapse of the right humeral head consistent with chronic avascular necrosis.  IMPRESSION: Mild bibasilar atelectasis. No consolidation or other significant changes identified.   Electronically Signed   By: Roxy Horseman M.D.   On: 03/15/2013 17:50   Dg Abd 2 Views  03/16/2013   CLINICAL DATA:  Small-bowel obstruction  EXAM: ABDOMEN - 2 VIEW  COMPARISON:  03/15/2013  FINDINGS: A nasogastric catheter is now noted within the stomach. There are embolization coils identified stable from the prior exam. Multiple dilated loops of small bowel are again identified. The overall appearance is slightly worse than that seen on the prior exam. This may be related in part due to more air as opposed of fluid within the bowel. Continued followup is recommended. No free air is seen. An ostomy is noted in the right lower quadrant.  IMPRESSION: Increase in the degree of gaseous distension although this may be related to some changes in the fluid dynamics within the small bowel. No free air is seen.   Electronically Signed   By: Alcide Clever M.D.   On: 03/16/2013 08:24   Dg Abd 2 Views  03/15/2013   CLINICAL DATA:  Small bowel obstruction. Distended abdomen. FOLLOW UP.  EXAM: ABDOMEN - 2 VIEW  COMPARISON:  03/14/2013  FINDINGS: Continued small bowel obstruction pattern with dilated small bowel loops  and air-fluid levels. Suspect no real change since prior CT. No free air or organomegaly.  No suspicious calcifications. No acute bony abnormality. Lung bases are clear.  IMPRESSION: Stable small bowel obstruction pattern.   Electronically Signed   By: Charlett Nose M.D.   On: 03/15/2013 08:37     PERTINENT LAB RESULTS: CBC:  Recent Labs  03/20/13 0503 03/22/13 0435  WBC 11.6* 10.8*  HGB 8.4* 7.5*  HCT 26.1* 23.0*  PLT 176 201   CMET CMP     Component Value Date/Time   NA 139 03/22/2013 0435   K 3.6 03/22/2013 0435   CL 104 03/22/2013 0435   CO2 27 03/22/2013 0435   GLUCOSE 142* 03/22/2013 0435   BUN 6 03/22/2013 0435   CREATININE 0.93 03/22/2013 0435   CREATININE 1.04 08/27/2012 1054   CALCIUM 8.4 03/22/2013 0435   CALCIUM 9.0 07/15/2011 0920   PROT 7.6 03/14/2013 0940   ALBUMIN 3.8 03/14/2013 0940   AST 25 03/14/2013 0940   ALT 13 03/14/2013 0940   ALKPHOS 76 03/14/2013 0940   BILITOT 0.4 03/14/2013 0940   GFRNONAA 64* 03/22/2013 0435   GFRAA 74* 03/22/2013 0435    GFR Estimated Creatinine Clearance: 53.7 ml/min (by C-G formula based on Cr of 0.93). No results found for this basename: LIPASE, AMYLASE,  in the last 72 hours No results found for this basename: CKTOTAL, CKMB, CKMBINDEX, TROPONINI,  in the last 72 hours No components found with this basename: POCBNP,  No results found for this basename: DDIMER,  in the last 72 hours No results found for this basename: HGBA1C,  in the last 72 hours No results found for this basename: CHOL, HDL, LDLCALC, TRIG, CHOLHDL, LDLDIRECT,  in the last 72 hours No results found for this basename: TSH, T4TOTAL, FREET3, T3FREE, THYROIDAB,  in the last 72 hours No results found for this basename: VITAMINB12, FOLATE, FERRITIN, TIBC, IRON, RETICCTPCT,  in the last 72 hours Coags: No results found for this basename: PT, INR,  in the last 72 hours Microbiology: Recent Results (from the past 240 hour(s))  MRSA PCR SCREENING     Status:  Abnormal   Collection Time    03/14/13  9:45 PM      Result Value Range Status   MRSA by PCR POSITIVE (*) NEGATIVE Final   Comment:            The GeneXpert MRSA Assay (FDA     approved for NASAL specimens     only), is one component of a     comprehensive MRSA colonization     surveillance program. It is not     intended to diagnose MRSA     infection nor to guide or     monitor treatment for     MRSA infections.     RESULT CALLED TO, READ BACK BY AND VERIFIED WITH:     CALLED TO RN ARIEL MUHAMMAD [id number] @0041  [name]  CULTURE, BLOOD (ROUTINE X 2)     Status: None   Collection Time    03/16/13  6:23 AM      Result Value Range Status   Specimen Description BLOOD RIGHT HAND   Final   Special Requests BOTTLES DRAWN AEROBIC AND ANAEROBIC 10CC EACH   Final   Culture  Setup Time     Final   Value: 03/16/2013 11:09     Performed at [facility]   Culture     Final   Value: NO GROWTH 5 DAYS     Performed at [facility]   Report Status 03/22/2013  FINAL   Final  CULTURE, BLOOD (ROUTINE X 2)     Status: None   Collection Time    03/16/13  6:33 AM      Result Value Range Status   Specimen Description BLOOD LEFT HAND   Final   Special Requests BOTTLES DRAWN AEROBIC ONLY 8CC   Final   Culture  Setup Time     Final   Value: 03/16/2013 11:09     Performed at [facility]   Culture     Final   Value: METHICILLIN RESISTANT STAPHYLOCOCCUS AUREUS     Note: RIFAMPIN AND GENTAMICIN SHOULD NOT BE USED AS SINGLE DRUGS FOR TREATMENT OF STAPH INFECTIONS. CRITICAL RESULT CALLED TO, READ BACK BY AND VERIFIED WITH: RACHEL LESPERANCE 03/18/13 1345 BY SMITHERSJ     Note: Gram Stain Report Called to,Read Back By and Verified With: DEANNA DILLON ON 03/18/2013 AT 12:30A BY WILEJ     Performed at [facility]   Report Status 03/19/2013 FINAL   Final   Organism ID, Bacteria METHICILLIN RESISTANT STAPHYLOCOCCUS AUREUS   Final  CULTURE, BLOOD (ROUTINE X 2)     Status: None    Collection Time    03/18/13  4:30 PM      Result Value Range Status   Specimen Description BLOOD RIGHT HAND   Final   Special Requests BOTTLES DRAWN AEROBIC ONLY 10CC   Final   Culture  Setup Time     Final   Value: 03/18/2013 22:25     Performed at [facility]   Culture     Final   Value:        BLOOD CULTURE RECEIVED NO GROWTH TO DATE CULTURE WILL BE HELD FOR 5 DAYS BEFORE ISSUING A FINAL NEGATIVE REPORT     Performed at [facility]   Report Status PENDING   Incomplete  CULTURE, BLOOD (ROUTINE X 2)     Status: None   Collection Time    03/18/13  4:45 PM      Result Value Range Status   Specimen Description BLOOD LEFT ARM   Final   Special Requests BOTTLES DRAWN AEROBIC ONLY 10CC   Final   Culture  Setup Time     Final   Value: 03/18/2013 22:26     Performed at [facility]   Culture     Final   Value:        BLOOD CULTURE RECEIVED NO GROWTH TO DATE CULTURE WILL BE HELD FOR 5 DAYS BEFORE ISSUING A FINAL NEGATIVE REPORT     Performed at [facility]   Report Status PENDING   Incomplete  URINE CULTURE     Status: None   Collection Time    03/21/13 10:11 AM      Result Value Range Status   Specimen Description URINE, RANDOM   Final   Special Requests NONE   Final   Culture  Setup Time     Final   Value: 03/21/2013 11:01     Performed at [facility] Count     Final   Value: NO GROWTH     Performed at [facility]   Culture     Final   Value: NO GROWTH     Performed at [facility]   Report Status 03/22/2013 FINAL   Final     BRIEF HOSPITAL COURSE:  SBO  -etiology likely Adhesions vs. Crohn's  - Patient underwent laparotomy  done on 10/22. - Postoperatively, her course was uncomplicated, because of a history of severe COPD she was monitored in the step down unit. Diet was slowly advanced. She is now tolerating a regular diet and is stable to be discharged. She will follow up with [facility] [place] [facility]  for further continued postop care.   MRSA Bacteremia  - During this hospital course, right before surgery patient had fever, blood cultures were obtained, one set of blood cultures positive for MRSA, ID consult obtained, TTE no obvious vegetation, she then underwent a transesophageal echocardiogram which did not show any vegetations. She was maintained on Vanco, currently she is on day 5 of vancomycin therapy. Plans are to continue with vancomycin in the outpatient setting to complete a 14 day course.  Culture Data  1. Blood culture on 10/22-one set-positive for MRSA  2. Blood culture on 10/24-neg  COPD  -has severe COPD-on Home O2  -stable during extubation per anesthesia  - we'll continue with home nebulizer regimen, start incentive spirometry. Pulmonary consultation was obtained for preop optimization  Hypernatremia  -2/2 to NPO, NG suction  -resolved with hypotonic solution   Crohn's  -S/P total colectomy.  -Cared for by [facility]- please keep the next appointment. - Stable   Anemia  -?secondary to peri-operative blood loss, acute illness  - Hemoglobin stable at 7.5, patient tolerating it well. Please check CBC at next visit with PCP.  Diabetes  -A1C 5.6 yesterday  -On Victoza at home have on admission, we'll resume on discharge. She was maintained on sliding scale insulin on inpatient.  HTN  -resume Cardizem and Metoprolol on discharge  Anxiety  -stable    TODAY-DAY OF DISCHARGE:  Subjective:   [name] today has no headache,no chest abdominal pain,no new weakness tingling or numbness, feels much better wants to go home today.  Objective:   Blood pressure 107/70, pulse 99, temperature 99.9 F (37.7 C), temperature source Oral, resp. rate 16, height 5\' 1"  (1.549 m), weight 65.6 kg (144 lb 10 oz), SpO2 100.00%.  Intake/Output Summary (Last 24 hours) at 03/22/13 1541 Last data filed at 03/22/13 1252  Gross per 24 hour  Intake 325.33 ml  Output   1465 ml   Net -1139.67 ml   Filed Weights   03/14/13 0930 03/14/13 1651 03/16/13 1542  Weight: 53.978 kg (119 lb) 54.432 kg (120 lb) 65.6 kg (144 lb 10 oz)    Exam Awake Alert, Oriented *3, No new F.N deficits, Normal affect [place].AT,PERRAL Supple Neck,No JVD, No cervical lymphadenopathy appriciated.  Symmetrical Chest wall movement, Good air movement bilaterally, CTAB RRR,No Gallops,Rubs or new Murmurs, No Parasternal Heave +ve B.Sounds, Abd Soft, Non tender, No organomegaly appriciated, No rebound -guarding or rigidity. No Cyanosis, Clubbing or edema, No new Rash or bruise  DISCHARGE CONDITION: Stable  DISPOSITION: Home with home health services   DISCHARGE INSTRUCTIONS:    Activity:  As tolerated with Full fall precautions use walker/cane & assistance as needed  Diet recommendation: Heart Healthy diet   Discharge Orders   Future Appointments Provider Department Dept Phone   03/29/2013 11:30 AM Gardiner Barefoot, MD [facility] [phone]   03/31/2013 9:45 AM Storm Frisk, MD [facility] [phone]   Future Orders Complete By Expires   Call MD for:  difficulty breathing, headache or visual disturbances  As directed    Call MD for:  redness, tenderness, or signs of infection (pain, swelling, redness, odor or green/yellow discharge around  incision site)  As directed    Call MD for:  severe uncontrolled pain  As directed    Diet - low sodium heart healthy  As directed    Increase activity slowly  As directed       Follow-up Information   Follow up with Shan Levans, MD On 03/31/2013. (9:45am)    Specialty:  Pulmonary Disease   Contact information:   [address] [place] [address] [phone]       Follow up with [facility]. (home health nurse for dressing changes, physical therapis for physical therapy and an aide)    Contact information:   [address] [place]  [address] [phone]       Follow up with Mariella Saa, MD. Schedule an appointment as soon as possible for a visit in 1 week.   Specialty:  General Surgery   Contact information:   [address] [place] [address] [phone]         Total Time spent on discharge equals 45 minutes.  SignedJeoffrey Massed 03/22/2013 3:41 PM

## 2013-03-22 NOTE — Progress Notes (Signed)
Subjective: Patient lying in bed watching television.  Rates abdominal pain 2/10.  Admits to some N/V throughout the early morning and decreased appetite.  States that her ileostomy is functioning fine and she is "burping" a lot.  Objective: Vital signs in last 24 hours: Temp:  [98.9 F (37.2 C)-99.6 F (37.6 C)] 98.9 F (37.2 C) (10/28 0519) Pulse Rate:  [93-111] 93 (10/28 0519) Resp:  [13-21] 18 (10/28 0519) BP: (81-122)/(33-76) 105/66 mmHg (10/28 0519) SpO2:  [85 %-100 %] 98 % (10/28 0854) Last BM Date: 03/22/13  Intake/Output from previous day: 10/27 0701 - 10/28 0700 In: 870 [I.V.:720; IV Piggyback:150] Out: 2165 [Urine:550; Stool:1615] Intake/Output this shift:   I/O last 3 completed shifts: In: 2510.7 [P.O.:480; I.V.:1730.7; IV Piggyback:300] Out: 2915 [Urine:650; Stool:2265]     General appearance: alert, cooperative and appears stated age Neuro: Alert and oriented x 4, responds appropriately Resp: clear to auscultation bilaterally Cardio: S1, S2 normal, no peripheral edema noted GI: soft, tender to palpation; hypoactive bowel sounds; no masses,  no organomegaly.  RLQ ileostomy bag in place and functioning. Pulses: 2+ and symmetric B/L upper and lower extremities Skin: Skin warm, color, texture, turgor normal.  No rashes or lesions Incision/Wound: Midline abdominal wound stapled, open to air, no drainage from incision noted.     Lab Results:   Recent Labs  03/20/13 0503 03/22/13 0435  WBC 11.6* 10.8*  HGB 8.4* 7.5*  HCT 26.1* 23.0*  PLT 176 201   BMET  Recent Labs  03/21/13 0742 03/22/13 0435  NA 141 139  K 3.3* 3.6  CL 106 104  CO2 28 27  GLUCOSE 97 142*  BUN 5* 6  CREATININE 0.85 0.93  CALCIUM 8.3* 8.4   PT/INR No results found for this basename: LABPROT, INR,  in the last 72 hours ABG No results found for this basename: PHART, PCO2, PO2, HCO3,  in the last 72 hours  Studies/Results: Dg Chest 2 View  03/21/2013   CLINICAL DATA:   Postoperative fever, post exploratory laparotomy with lysis of adhesions and mesh ventral hernia repair on 03/16/2013  EXAM: CHEST  2 VIEW  COMPARISON:  03/15/2013  FINDINGS: Normal heart size, mediastinal contours, and pulmonary vascularity.  Atelectasis versus consolidation in medial left lower lobe.  Lungs appear emphysematous but otherwise clear.  Tiny left pleural effusion.  No pneumothorax.  Bones unremarkable.  Interval removal of nasogastric tube.  IMPRESSION: Emphysematous changes with left lower lobe atelectasis versus consolidation and tiny left pleural effusion.   Electronically Signed   By: Ulyses Southward M.D.   On: 03/21/2013 17:33    Anti-infectives: Anti-infectives   Start     Dose/Rate Route Frequency Ordered Stop   03/22/13 0000  Vancomycin (VANCOCIN) 750 MG/150ML SOLN     750 mg 150 mL/hr over 60 Minutes Intravenous Every 12 hours 03/22/13 0912     03/18/13 0200  vancomycin (VANCOCIN) IVPB 750 mg/150 ml premix     750 mg 150 mL/hr over 60 Minutes Intravenous Every 12 hours 03/18/13 0126     03/16/13 0930  ertapenem (INVANZ) 1 g in sodium chloride 0.9 % 50 mL IVPB  Status:  Discontinued     1 g 100 mL/hr over 30 Minutes Intravenous Every 24 hours 03/16/13 0841 03/16/13 1548   03/14/13 2200  hydroxychloroquine (PLAQUENIL) tablet 200 mg  Status:  Discontinued     200 mg Oral 2 times daily 03/14/13 1624 03/16/13 1548      Assessment/Plan: DM Crohn's Disease COPD/Home O2 Post-op  fever Being managed by primary team  62 y.o female with a history of Crohn's Disease and an ileostomy/coloectomy in 2007.  + for a PSBO which she underwent surgery on 10/22. Developed a fever post-operatively.   PSBO-Post-op day #6 Patient doing well.  Has had some N/V.  T-max for the past 24 hrs. 99.2, WBC down to 10.8 from 11.6 and is currently receiving antibiotic therapy.  Advance diet as tolerated.  Pain is well controlled.  Stable from surgery standpoint.     LOS: 8 days     Norm Parcel NP Student 03/22/2013

## 2013-03-22 NOTE — Progress Notes (Signed)
[facility]  Date of Admission:  03/14/2013  Antibiotics: Antibiotics Given (last 72 hours)   Date/Time Action Medication Dose Rate   03/19/13 1436 Given   vancomycin (VANCOCIN) IVPB 750 mg/150 ml premix 750 mg 150 mL/hr   03/20/13 0215 Given   vancomycin (VANCOCIN) IVPB 750 mg/150 ml premix 750 mg 150 mL/hr   03/20/13 1308 Given   vancomycin (VANCOCIN) IVPB 750 mg/150 ml premix 750 mg 150 mL/hr   03/21/13 0233 Given   vancomycin (VANCOCIN) IVPB 750 mg/150 ml premix 750 mg 150 mL/hr   03/21/13 1257 Given  [just came back from [facility]]   vancomycin (VANCOCIN) IVPB 750 mg/150 ml premix 750 mg 150 mL/hr   03/22/13 0118 Given   vancomycin (VANCOCIN) IVPB 750 mg/150 ml premix 750 mg 150 mL/hr      Subjective: No complaints  Objective: Temp:  [98.9 F (37.2 C)-99.6 F (37.6 C)] 98.9 F (37.2 C) (10/28 0519) Pulse Rate:  [93-111] 93 (10/28 0519) Resp:  [13-21] 18 (10/28 0519) BP: (81-122)/(33-76) 105/66 mmHg (10/28 0519) SpO2:  [85 %-100 %] 98 % (10/28 0854)  Awake, alert RRR CTA B No edema No rash  Lab Results Lab Results  Component Value Date   WBC 10.8* 03/22/2013   HGB 7.5* 03/22/2013   HCT 23.0* 03/22/2013   MCV 86.5 03/22/2013   PLT 201 03/22/2013    Lab Results  Component Value Date   CREATININE 0.93 03/22/2013   BUN 6 03/22/2013   NA 139 03/22/2013   K 3.6 03/22/2013   CL 104 03/22/2013   CO2 27 03/22/2013    Lab Results  Component Value Date   ALT 13 03/14/2013   AST 25 03/14/2013   ALKPHOS 76 03/14/2013   BILITOT 0.4 03/14/2013      Microbiology: Recent Results (from the past 240 hour(s))  MRSA PCR SCREENING     Status: Abnormal   Collection Time    03/14/13  9:45 PM      Result Value Range Status   MRSA by PCR POSITIVE (*) NEGATIVE Final   Comment:            The GeneXpert MRSA Assay (FDA     approved for NASAL specimens     only), is one component of a     comprehensive MRSA colonization     surveillance  program. It is not     intended to diagnose MRSA     infection nor to guide or     monitor treatment for     MRSA infections.     RESULT CALLED TO, READ BACK BY AND VERIFIED WITH:     CALLED TO RN ARIEL MUHAMMAD [id number] @0041  [name]  CULTURE, BLOOD (ROUTINE X 2)     Status: None   Collection Time    03/16/13  6:23 AM      Result Value Range Status   Specimen Description BLOOD RIGHT HAND   Final   Special Requests BOTTLES DRAWN AEROBIC AND ANAEROBIC 10CC EACH   Final   Culture  Setup Time     Final   Value: 03/16/2013 11:09     Performed at [facility]   Culture     Final   Value: NO GROWTH 5 DAYS     Performed at [facility]   Report Status 03/22/2013 FINAL   Final  CULTURE, BLOOD (ROUTINE X 2)     Status: None   Collection Time  03/16/13  6:33 AM      Result Value Range Status   Specimen Description BLOOD LEFT HAND   Final   Special Requests BOTTLES DRAWN AEROBIC ONLY 8CC   Final   Culture  Setup Time     Final   Value: 03/16/2013 11:09     Performed at [facility]   Culture     Final   Value: METHICILLIN RESISTANT STAPHYLOCOCCUS AUREUS     Note: RIFAMPIN AND GENTAMICIN SHOULD NOT BE USED AS SINGLE DRUGS FOR TREATMENT OF STAPH INFECTIONS. CRITICAL RESULT CALLED TO, READ BACK BY AND VERIFIED WITH: RACHEL LESPERANCE 03/18/13 1345 BY SMITHERSJ     Note: Gram Stain Report Called to,Read Back By and Verified With: DEANNA DILLON ON 03/18/2013 AT 12:30A BY WILEJ     Performed at [facility]   Report Status 03/19/2013 FINAL   Final   Organism ID, Bacteria METHICILLIN RESISTANT STAPHYLOCOCCUS AUREUS   Final  CULTURE, BLOOD (ROUTINE X 2)     Status: None   Collection Time    03/18/13  4:30 PM      Result Value Range Status   Specimen Description BLOOD RIGHT HAND   Final   Special Requests BOTTLES DRAWN AEROBIC ONLY 10CC   Final   Culture  Setup Time     Final   Value: 03/18/2013 22:25     Performed at [facility]   Culture     Final    Value:        BLOOD CULTURE RECEIVED NO GROWTH TO DATE CULTURE WILL BE HELD FOR 5 DAYS BEFORE ISSUING A FINAL NEGATIVE REPORT     Performed at [facility]   Report Status PENDING   Incomplete  CULTURE, BLOOD (ROUTINE X 2)     Status: None   Collection Time    03/18/13  4:45 PM      Result Value Range Status   Specimen Description BLOOD LEFT ARM   Final   Special Requests BOTTLES DRAWN AEROBIC ONLY 10CC   Final   Culture  Setup Time     Final   Value: 03/18/2013 22:26     Performed at [facility]   Culture     Final   Value:        BLOOD CULTURE RECEIVED NO GROWTH TO DATE CULTURE WILL BE HELD FOR 5 DAYS BEFORE ISSUING A FINAL NEGATIVE REPORT     Performed at [facility]   Report Status PENDING   Incomplete  URINE CULTURE     Status: None   Collection Time    03/21/13 10:11 AM      Result Value Range Status   Specimen Description URINE, RANDOM   Final   Special Requests NONE   Final   Culture  Setup Time     Final   Value: 03/21/2013 11:01     Performed at [facility] Count     Final   Value: NO GROWTH     Performed at [facility]   Culture     Final   Value: NO GROWTH     Performed at [facility]   Report Status 03/22/2013 FINAL   Final    Studies/Results: Dg Chest 2 View  03/21/2013   CLINICAL DATA:  Postoperative fever, post exploratory laparotomy with lysis of adhesions and mesh ventral hernia repair on 03/16/2013  EXAM: CHEST  2 VIEW  COMPARISON:  03/15/2013  FINDINGS:  Normal heart size, mediastinal contours, and pulmonary vascularity.  Atelectasis versus consolidation in medial left lower lobe.  Lungs appear emphysematous but otherwise clear.  Tiny left pleural effusion.  No pneumothorax.  Bones unremarkable.  Interval removal of nasogastric tube.  IMPRESSION: Emphysematous changes with left lower lobe atelectasis versus consolidation and tiny left pleural effusion.   Electronically Signed   By: Ulyses Southward M.D.    On: 03/21/2013 17:33    Assessment/Plan: 1) MRSA  Bacteremia - [facility] negative,  2 weeks of IV vancomycin through Nov 6th.  Repeat blood cultures negative.  -[facility] today -we will arrange follow up in [facility] around nov 6th. -antibiotics per home health protocol  Ildefonso Keaney, Molly Maduro, MD [facility] [email or web address] [phone] pager   [phone] cell 03/22/2013, 10:52 AM

## 2013-03-22 NOTE — Progress Notes (Signed)
Patient is to be discharged later today. Follow-up 1 week with Dr. Johna Sheriff for staple removal.  Wilmon Arms. Corliss Skains, MD, [facility] Surgery  General/ Trauma Surgery  03/22/2013 2:39 PM

## 2013-03-22 NOTE — Progress Notes (Signed)
[name] to be D/C'd Home with home health per MD order.  Discussed with the patient and all questions fully answered.    Medication List         AEROCHAMBER PLUS inhaler  Use as instructed     arformoterol 15 MCG/2ML Nebu  Commonly known as:  BROVANA  Take 2 mLs (15 mcg total) by nebulization 2 (two) times daily.     aspirin 81 MG tablet  Take 81 mg by mouth every other day.     budesonide 0.25 MG/2ML nebulizer solution  Commonly known as:  PULMICORT  Take 2 mLs (0.25 mg total) by nebulization 2 (two) times daily. Dx 496     celecoxib 200 MG capsule  Commonly known as:  CELEBREX  Take 200 mg by mouth daily.     citalopram 20 MG tablet  Commonly known as:  CELEXA  Take 20 mg by mouth daily.     diltiazem 180 MG 24 hr capsule  Commonly known as:  CARDIZEM CD  Take 1 capsule (180 mg total) by mouth 2 (two) times daily.     famotidine 20 MG tablet  Commonly known as:  PEPCID  Take 1 tablet (20 mg total) by mouth 2 (two) times daily.     fluticasone 50 MCG/ACT nasal spray  Commonly known as:  FLONASE  Place 2 sprays into the nose daily.     furosemide 20 MG tablet  Commonly known as:  LASIX  TAKE 1 TABLET TWICE A DAY AS NEEDED     guaiFENesin 600 MG 12 hr tablet  Commonly known as:  MUCINEX  Take 1,200 mg by mouth 2 (two) times daily.     HYDROcodone-acetaminophen 5-325 MG per tablet  Commonly known as:  NORCO/VICODIN  Take 1 tablet by mouth every 6 (six) hours as needed for pain.     hydroxychloroquine 200 MG tablet  Commonly known as:  PLAQUENIL  Take 1 tablet by mouth Twice daily.     IRON PO  Take 1 tablet by mouth every Monday, Wednesday, and Friday.     LORazepam 0.5 MG tablet  Commonly known as:  ATIVAN  Take 1 tablet (0.5 mg total) by mouth every 8 (eight) hours as needed for anxiety.     metoprolol tartrate 25 MG tablet  Commonly known as:  LOPRESSOR  Take 0.5 tablets (12.5 mg total) by mouth 2 (two) times daily.     multivitamin with  minerals Tabs tablet  Take 1 tablet by mouth daily.     omeprazole 40 MG capsule  Commonly known as:  PRILOSEC  Take 40 mg by mouth 2 (two) times daily.     OXYGEN-HELIUM IN  Inhale 2.5-3 L into the lungs.     rosuvastatin 5 MG tablet  Commonly known as:  CRESTOR  Take 5 mg by mouth every Monday, Wednesday, and Friday.     tiotropium 18 MCG inhalation capsule  Commonly known as:  SPIRIVA  Place 1 capsule (18 mcg total) into inhaler and inhale daily.     Vancomycin 750 MG/150ML Soln  Commonly known as:  VANCOCIN  Inject 150 mLs (750 mg total) into the vein every 12 (twelve) hours. Continue for 9 more days from 03/22/13     VICTOZA 18 MG/3ML Soln injection  Generic drug:  Liraglutide  1.8 units once daily     Vitamin D 1000 UNITS capsule  Take 1,000 Units by mouth daily.        VVS, Skin  clean, dry and intact without evidence of skin break down, no evidence of skin tears noted. IV catheter discontinued intact. Site without signs and symptoms of complications. Dressing and pressure applied.  An After Visit Summary was printed and given to the patient. Patient escorted via WC, and D/C home via private auto.  Kennyth Arnold D 03/22/2013 4:59 PM

## 2013-03-24 ENCOUNTER — Other Ambulatory Visit: Payer: Self-pay

## 2013-03-24 LAB — CULTURE, BLOOD (ROUTINE X 2): Culture: NO GROWTH

## 2013-03-24 MED ORDER — DILTIAZEM HCL ER COATED BEADS 180 MG PO CP24: 180.0000 mg | ORAL_CAPSULE | Freq: Two times a day (BID) | ORAL | Status: DC

## 2013-03-24 NOTE — Telephone Encounter (Signed)
Rx was sent to pharmacy electronically. 

## 2013-03-27 ENCOUNTER — Emergency Department (HOSPITAL_COMMUNITY): Payer: Medicare Other

## 2013-03-27 ENCOUNTER — Encounter (HOSPITAL_COMMUNITY): Payer: Self-pay | Admitting: Emergency Medicine

## 2013-03-27 ENCOUNTER — Inpatient Hospital Stay (HOSPITAL_COMMUNITY)
Admission: EM | Admit: 2013-03-27 | Discharge: 2013-04-06 | DRG: 871 | Disposition: A | Discharge status: Home / Self Care | Payer: Medicare Other | Attending: Surgery | Admitting: Surgery

## 2013-03-27 ENCOUNTER — Inpatient Hospital Stay (HOSPITAL_COMMUNITY): Payer: Medicare Other

## 2013-03-27 DIAGNOSIS — E119 Type 2 diabetes mellitus without complications: Secondary | ICD-10-CM

## 2013-03-27 DIAGNOSIS — I498 Other specified cardiac arrhythmias: Secondary | ICD-10-CM | POA: Diagnosis present

## 2013-03-27 DIAGNOSIS — N879 Dysplasia of cervix uteri, unspecified: Secondary | ICD-10-CM

## 2013-03-27 DIAGNOSIS — F419 Anxiety disorder, unspecified: Secondary | ICD-10-CM

## 2013-03-27 DIAGNOSIS — Y849 Medical procedure, unspecified as the cause of abnormal reaction of the patient, or of later complication, without mention of misadventure at the time of the procedure: Secondary | ICD-10-CM | POA: Clinically undetermined

## 2013-03-27 DIAGNOSIS — Z833 Family history of diabetes mellitus: Secondary | ICD-10-CM

## 2013-03-27 DIAGNOSIS — N731 Chronic parametritis and pelvic cellulitis: Secondary | ICD-10-CM | POA: Diagnosis present

## 2013-03-27 DIAGNOSIS — M069 Rheumatoid arthritis, unspecified: Secondary | ICD-10-CM | POA: Diagnosis present

## 2013-03-27 DIAGNOSIS — K651 Peritoneal abscess: Secondary | ICD-10-CM | POA: Diagnosis present

## 2013-03-27 DIAGNOSIS — IMO0002 Reserved for concepts with insufficient information to code with codable children: Secondary | ICD-10-CM | POA: Diagnosis present

## 2013-03-27 DIAGNOSIS — N739 Female pelvic inflammatory disease, unspecified: Secondary | ICD-10-CM | POA: Diagnosis present

## 2013-03-27 DIAGNOSIS — J9610 Chronic respiratory failure, unspecified whether with hypoxia or hypercapnia: Secondary | ICD-10-CM

## 2013-03-27 DIAGNOSIS — K5090 Crohn's disease, unspecified, without complications: Secondary | ICD-10-CM | POA: Diagnosis present

## 2013-03-27 DIAGNOSIS — A4902 Methicillin resistant Staphylococcus aureus infection, unspecified site: Secondary | ICD-10-CM | POA: Diagnosis present

## 2013-03-27 DIAGNOSIS — J439 Emphysema, unspecified: Secondary | ICD-10-CM | POA: Diagnosis present

## 2013-03-27 DIAGNOSIS — K219 Gastro-esophageal reflux disease without esophagitis: Secondary | ICD-10-CM | POA: Diagnosis present

## 2013-03-27 DIAGNOSIS — E785 Hyperlipidemia, unspecified: Secondary | ICD-10-CM | POA: Diagnosis present

## 2013-03-27 DIAGNOSIS — Z7982 Long term (current) use of aspirin: Secondary | ICD-10-CM

## 2013-03-27 DIAGNOSIS — R7881 Bacteremia: Secondary | ICD-10-CM

## 2013-03-27 DIAGNOSIS — D649 Anemia, unspecified: Secondary | ICD-10-CM | POA: Diagnosis present

## 2013-03-27 DIAGNOSIS — J449 Chronic obstructive pulmonary disease, unspecified: Secondary | ICD-10-CM | POA: Diagnosis present

## 2013-03-27 DIAGNOSIS — R9431 Abnormal electrocardiogram [ECG] [EKG]: Secondary | ICD-10-CM

## 2013-03-27 DIAGNOSIS — Z87891 Personal history of nicotine dependence: Secondary | ICD-10-CM

## 2013-03-27 DIAGNOSIS — J438 Other emphysema: Secondary | ICD-10-CM

## 2013-03-27 DIAGNOSIS — R109 Unspecified abdominal pain: Secondary | ICD-10-CM

## 2013-03-27 DIAGNOSIS — D72829 Elevated white blood cell count, unspecified: Secondary | ICD-10-CM

## 2013-03-27 DIAGNOSIS — D638 Anemia in other chronic diseases classified elsewhere: Secondary | ICD-10-CM | POA: Diagnosis present

## 2013-03-27 DIAGNOSIS — J4489 Other specified chronic obstructive pulmonary disease: Secondary | ICD-10-CM | POA: Diagnosis present

## 2013-03-27 DIAGNOSIS — A419 Sepsis, unspecified organism: Principal | ICD-10-CM | POA: Diagnosis present

## 2013-03-27 DIAGNOSIS — E860 Dehydration: Secondary | ICD-10-CM

## 2013-03-27 DIAGNOSIS — E43 Unspecified severe protein-calorie malnutrition: Secondary | ICD-10-CM | POA: Diagnosis present

## 2013-03-27 DIAGNOSIS — N179 Acute kidney failure, unspecified: Secondary | ICD-10-CM | POA: Diagnosis present

## 2013-03-27 DIAGNOSIS — Z9981 Dependence on supplemental oxygen: Secondary | ICD-10-CM

## 2013-03-27 DIAGNOSIS — E876 Hypokalemia: Secondary | ICD-10-CM | POA: Diagnosis present

## 2013-03-27 DIAGNOSIS — I10 Essential (primary) hypertension: Secondary | ICD-10-CM | POA: Diagnosis present

## 2013-03-27 DIAGNOSIS — R6510 Systemic inflammatory response syndrome (SIRS) of non-infectious origin without acute organ dysfunction: Secondary | ICD-10-CM

## 2013-03-27 DIAGNOSIS — Z113 Encounter for screening for infections with a predominantly sexual mode of transmission: Secondary | ICD-10-CM

## 2013-03-27 DIAGNOSIS — M810 Age-related osteoporosis without current pathological fracture: Secondary | ICD-10-CM | POA: Diagnosis present

## 2013-03-27 LAB — POCT I-STAT, CHEM 8
BUN: 6 mg/dL (ref 6–23)
Calcium, Ion: 1.13 mmol/L (ref 1.13–1.30)
Chloride: 100 mEq/L (ref 96–112)
HCT: 22 % — ABNORMAL LOW (ref 36.0–46.0)
Potassium: 3 mEq/L — ABNORMAL LOW (ref 3.5–5.1)
TCO2: 30 mmol/L (ref 0–100)

## 2013-03-27 LAB — URINALYSIS, ROUTINE W REFLEX MICROSCOPIC
Bilirubin Urine: NEGATIVE
Glucose, UA: NEGATIVE mg/dL
Ketones, ur: 15 mg/dL — AB
Protein, ur: 30 mg/dL — AB
Specific Gravity, Urine: 1.017 (ref 1.005–1.030)
Urobilinogen, UA: 0.2 mg/dL (ref 0.0–1.0)

## 2013-03-27 LAB — URINE MICROSCOPIC-ADD ON

## 2013-03-27 LAB — COMPREHENSIVE METABOLIC PANEL
AST: 30 U/L (ref 0–37)
CO2: 27 mEq/L (ref 19–32)
Calcium: 7.9 mg/dL — ABNORMAL LOW (ref 8.4–10.5)
Chloride: 100 mEq/L (ref 96–112)
Creatinine, Ser: 1.01 mg/dL (ref 0.50–1.10)
GFR calc Af Amer: 67 mL/min — ABNORMAL LOW (ref >90)
GFR calc non Af Amer: 58 mL/min — ABNORMAL LOW (ref >90)
Glucose, Bld: 87 mg/dL (ref 70–99)
Total Bilirubin: 0.4 mg/dL (ref 0.3–1.2)
Total Protein: 5.7 g/dL — ABNORMAL LOW (ref 6.0–8.3)

## 2013-03-27 LAB — CBC WITH DIFFERENTIAL/PLATELET
Basophils Absolute: 0 10*3/uL (ref 0.0–0.1)
Eosinophils Relative: 1 % (ref 0–5)
HCT: 22.6 % — ABNORMAL LOW (ref 36.0–46.0)
Hemoglobin: 7.6 g/dL — ABNORMAL LOW (ref 12.0–15.0)
Lymphocytes Relative: 4 % — ABNORMAL LOW (ref 12–46)
Lymphs Abs: 0.8 10*3/uL (ref 0.7–4.0)
MCH: 28.5 pg (ref 26.0–34.0)
MCV: 84.6 fL (ref 78.0–100.0)
Monocytes Absolute: 1.4 10*3/uL — ABNORMAL HIGH (ref 0.1–1.0)
Monocytes Relative: 7 % (ref 3–12)
Neutro Abs: 18.2 10*3/uL — ABNORMAL HIGH (ref 1.7–7.7)
RBC: 2.67 MIL/uL — ABNORMAL LOW (ref 3.87–5.11)
WBC: 20.7 10*3/uL — ABNORMAL HIGH (ref 4.0–10.5)

## 2013-03-27 LAB — VANCOMYCIN, TROUGH: Vancomycin Tr: 25.5 ug/mL (ref 10.0–20.0)

## 2013-03-27 LAB — MAGNESIUM: Magnesium: 1.6 mg/dL (ref 1.5–2.5)

## 2013-03-27 LAB — MRSA PCR SCREENING: MRSA by PCR: NEGATIVE

## 2013-03-27 LAB — LACTIC ACID, PLASMA: Lactic Acid, Venous: 1.3 mmol/L (ref 0.5–2.2)

## 2013-03-27 MED ORDER — ACETAMINOPHEN 325 MG PO TABS
650.0000 mg | ORAL_TABLET | Freq: Once | ORAL | Status: AC
  Administered 2013-03-27: 650 mg via ORAL
  Filled 2013-03-27: qty 2

## 2013-03-27 MED ORDER — POTASSIUM CHLORIDE 10 MEQ/100ML IV SOLN
10.0000 meq | INTRAVENOUS | Status: AC
  Administered 2013-03-27 (×3): 10 meq via INTRAVENOUS
  Filled 2013-03-27 (×3): qty 100

## 2013-03-27 MED ORDER — IOHEXOL 300 MG/ML  SOLN
100.0000 mL | Freq: Once | INTRAMUSCULAR | Status: AC | PRN
  Administered 2013-03-27: 100 mL via INTRAVENOUS

## 2013-03-27 MED ORDER — SODIUM CHLORIDE 0.9 % IV BOLUS (SEPSIS)
1000.0000 mL | INTRAVENOUS | Status: DC | PRN
  Administered 2013-03-27: 1000 mL via INTRAVENOUS

## 2013-03-27 MED ORDER — PIPERACILLIN-TAZOBACTAM 3.375 G IVPB 30 MIN
3.3750 g | Freq: Once | INTRAVENOUS | Status: AC
  Administered 2013-03-27: 3.375 g via INTRAVENOUS
  Filled 2013-03-27: qty 50

## 2013-03-27 MED ORDER — INSULIN ASPART 100 UNIT/ML ~~LOC~~ SOLN
0.0000 [IU] | Freq: Three times a day (TID) | SUBCUTANEOUS | Status: DC
  Administered 2013-03-30: 2 [IU] via SUBCUTANEOUS
  Administered 2013-04-04: 1 [IU] via SUBCUTANEOUS

## 2013-03-27 MED ORDER — FAMOTIDINE 20 MG PO TABS
20.0000 mg | ORAL_TABLET | Freq: Two times a day (BID) | ORAL | Status: DC
  Administered 2013-03-27 – 2013-04-06 (×19): 20 mg via ORAL
  Filled 2013-03-27 (×24): qty 1

## 2013-03-27 MED ORDER — HYDROXYCHLOROQUINE SULFATE 200 MG PO TABS
200.0000 mg | ORAL_TABLET | Freq: Two times a day (BID) | ORAL | Status: DC
  Administered 2013-03-27 – 2013-04-06 (×19): 200 mg via ORAL
  Filled 2013-03-27 (×24): qty 1

## 2013-03-27 MED ORDER — MORPHINE SULFATE 4 MG/ML IJ SOLN
4.0000 mg | Freq: Once | INTRAMUSCULAR | Status: AC
  Administered 2013-03-27: 4 mg via INTRAVENOUS
  Filled 2013-03-27: qty 1

## 2013-03-27 MED ORDER — CITALOPRAM HYDROBROMIDE 20 MG PO TABS
20.0000 mg | ORAL_TABLET | Freq: Every day | ORAL | Status: DC
  Administered 2013-03-28 – 2013-04-06 (×10): 20 mg via ORAL
  Filled 2013-03-27 (×12): qty 1

## 2013-03-27 MED ORDER — SODIUM CHLORIDE 0.9 % IV SOLN
INTRAVENOUS | Status: AC
  Administered 2013-03-27: 17:00:00 via INTRAVENOUS

## 2013-03-27 MED ORDER — VANCOMYCIN HCL 500 MG IV SOLR
500.0000 mg | Freq: Two times a day (BID) | INTRAVENOUS | Status: DC
  Administered 2013-03-28 – 2013-03-31 (×7): 500 mg via INTRAVENOUS
  Filled 2013-03-27 (×12): qty 500

## 2013-03-27 MED ORDER — MAGNESIUM SULFATE IN D5W 10-5 MG/ML-% IV SOLN
1.0000 g | Freq: Once | INTRAVENOUS | Status: AC
  Administered 2013-03-27: 1 g via INTRAVENOUS
  Filled 2013-03-27: qty 100

## 2013-03-27 MED ORDER — ONDANSETRON HCL 4 MG/2ML IJ SOLN
4.0000 mg | Freq: Once | INTRAMUSCULAR | Status: AC
  Administered 2013-03-27: 4 mg via INTRAVENOUS
  Filled 2013-03-27: qty 2

## 2013-03-27 MED ORDER — SODIUM CHLORIDE 0.9 % IV SOLN: INTRAVENOUS | Status: DC

## 2013-03-27 MED ORDER — METOPROLOL TARTRATE 12.5 MG HALF TABLET
12.5000 mg | ORAL_TABLET | Freq: Two times a day (BID) | ORAL | Status: DC
  Administered 2013-03-27: 12.5 mg via ORAL
  Filled 2013-03-27 (×3): qty 1

## 2013-03-27 MED ORDER — PIPERACILLIN-TAZOBACTAM 3.375 G IVPB
3.3750 g | Freq: Three times a day (TID) | INTRAVENOUS | Status: DC
  Administered 2013-03-27 – 2013-03-31 (×11): 3.375 g via INTRAVENOUS
  Filled 2013-03-27 (×16): qty 50

## 2013-03-27 MED ORDER — SODIUM CHLORIDE 0.9 % IV SOLN
INTRAVENOUS | Status: DC
  Administered 2013-03-29 – 2013-04-03 (×6): via INTRAVENOUS

## 2013-03-27 MED ORDER — FLUTICASONE PROPIONATE 50 MCG/ACT NA SUSP
2.0000 | Freq: Every day | NASAL | Status: DC
  Administered 2013-03-28 – 2013-04-05 (×7): 2 via NASAL
  Filled 2013-03-27 (×2): qty 16

## 2013-03-27 MED ORDER — HYDROMORPHONE HCL PF 1 MG/ML IJ SOLN
1.0000 mg | INTRAMUSCULAR | Status: AC | PRN
  Administered 2013-03-27: 1 mg via INTRAVENOUS
  Filled 2013-03-27 (×2): qty 1

## 2013-03-27 MED ORDER — MORPHINE SULFATE 2 MG/ML IJ SOLN
1.0000 mg | INTRAMUSCULAR | Status: DC | PRN
  Administered 2013-03-28 – 2013-04-02 (×18): 1 mg via INTRAVENOUS
  Filled 2013-03-27 (×20): qty 1

## 2013-03-27 MED ORDER — ARFORMOTEROL TARTRATE 15 MCG/2ML IN NEBU
15.0000 ug | INHALATION_SOLUTION | Freq: Two times a day (BID) | RESPIRATORY_TRACT | Status: DC
  Administered 2013-03-27 – 2013-04-06 (×16): 15 ug via RESPIRATORY_TRACT
  Filled 2013-03-27 (×24): qty 2

## 2013-03-27 MED ORDER — LORAZEPAM 0.5 MG PO TABS
0.5000 mg | ORAL_TABLET | Freq: Three times a day (TID) | ORAL | Status: DC | PRN
  Administered 2013-03-29 – 2013-04-06 (×3): 0.5 mg via ORAL
  Filled 2013-03-27 (×3): qty 1

## 2013-03-27 MED ORDER — DILTIAZEM HCL ER COATED BEADS 180 MG PO CP24
180.0000 mg | ORAL_CAPSULE | Freq: Two times a day (BID) | ORAL | Status: DC
  Administered 2013-03-27: 180 mg via ORAL
  Filled 2013-03-27 (×3): qty 1

## 2013-03-27 MED ORDER — VANCOMYCIN HCL 500 MG IV SOLR
500.0000 mg | Freq: Two times a day (BID) | INTRAVENOUS | Status: DC
  Filled 2013-03-27 (×2): qty 500

## 2013-03-27 MED ORDER — SODIUM CHLORIDE 0.9 % IV BOLUS (SEPSIS)
1000.0000 mL | INTRAVENOUS | Status: AC
  Administered 2013-03-27: 1000 mL via INTRAVENOUS

## 2013-03-27 MED ORDER — IOHEXOL 300 MG/ML  SOLN: 25.0000 mL | INTRAMUSCULAR | Status: DC | PRN

## 2013-03-27 MED ORDER — SODIUM CHLORIDE 0.9 % IV SOLN
1.0000 g | Freq: Once | INTRAVENOUS | Status: AC
  Administered 2013-03-27: 1 g via INTRAVENOUS
  Filled 2013-03-27: qty 10

## 2013-03-27 MED ORDER — TIOTROPIUM BROMIDE MONOHYDRATE 18 MCG IN CAPS
18.0000 ug | ORAL_CAPSULE | Freq: Every day | RESPIRATORY_TRACT | Status: DC
  Administered 2013-03-28 – 2013-04-06 (×8): 18 ug via RESPIRATORY_TRACT
  Filled 2013-03-27 (×3): qty 5

## 2013-03-27 MED ORDER — ONDANSETRON HCL 4 MG/2ML IJ SOLN: 4.0000 mg | Freq: Three times a day (TID) | INTRAMUSCULAR | Status: AC | PRN

## 2013-03-27 MED ORDER — SODIUM CHLORIDE 0.9 % IJ SOLN
3.0000 mL | Freq: Two times a day (BID) | INTRAMUSCULAR | Status: DC
  Administered 2013-03-28 – 2013-04-05 (×6): 3 mL via INTRAVENOUS

## 2013-03-27 MED ORDER — ENOXAPARIN SODIUM 40 MG/0.4ML ~~LOC~~ SOLN
40.0000 mg | SUBCUTANEOUS | Status: DC
  Administered 2013-03-27 – 2013-04-05 (×8): 40 mg via SUBCUTANEOUS
  Filled 2013-03-27 (×15): qty 0.4

## 2013-03-27 MED ORDER — BUDESONIDE 0.25 MG/2ML IN SUSP
0.2500 mg | Freq: Two times a day (BID) | RESPIRATORY_TRACT | Status: DC
  Administered 2013-03-27 – 2013-04-06 (×18): 0.25 mg via RESPIRATORY_TRACT
  Filled 2013-03-27 (×23): qty 2

## 2013-03-27 NOTE — ED Notes (Signed)
The patient refused rectal temp. The patient agreed to an axillary temp.

## 2013-03-27 NOTE — ED Notes (Signed)
Pt. Has c/o not feeling well for one week.  Pt. Has c/o SOB and chest pain, abdominal pain, and fever that started today. Pt. Is normally on @L  O2 at home.  Oxygen requirement has not increased.

## 2013-03-27 NOTE — ED Notes (Signed)
Pt to CT scan via stretcher with transport 

## 2013-03-27 NOTE — Progress Notes (Signed)
ANTIBIOTIC CONSULT NOTE - INITIAL  Pharmacy Consult for Vancomycin + Zosyn Indication: Empiric coverage in setting of fevers  Allergies  Allergen Reactions  . Esomeprazole Magnesium Other (See Comments)    NEXIUM - reaction > aggravated pt's Crohn's disease  . Other     Beans, Dander/Dust, Peas, Mushrooms  . Peanut-Containing Drug Products Other (See Comments)    unknown  . Shellfish Allergy Other (See Comments)    unknown  . Surgical Lubricant Other (See Comments)    Burns skin     Patient Measurements: Height: 5' 1.02" (155 cm) Weight: 144 lb 10 oz (65.6 kg) IBW/kg (Calculated) : 47.86  Vital Signs: Temp: 101.9 F (38.8 C) (11/02 1241) Temp src: Oral (11/02 1241) BP: 103/50 mmHg (11/02 1241) Pulse Rate: 125 (11/02 1241) Intake/Output from previous day:   Intake/Output from this shift:    Labs: No results found for this basename: WBC, HGB, PLT, LABCREA, CREATININE,  in the last 72 hours Estimated Creatinine Clearance: 53.8 ml/min (by C-G formula based on Cr of 0.93). No results found for this basename: VANCOTROUGH, Leodis Binet, VANCORANDOM, GENTTROUGH, GENTPEAK, GENTRANDOM, TOBRATROUGH, TOBRAPEAK, TOBRARND, AMIKACINPEAK, AMIKACINTROU, AMIKACIN,  in the last 72 hours   Microbiology: Recent Results (from the past 720 hour(s))  MRSA PCR SCREENING     Status: Abnormal   Collection Time    03/14/13  9:45 PM      Result Value Range Status   MRSA by PCR POSITIVE (*) NEGATIVE Final   Comment:            The GeneXpert MRSA Assay (FDA     approved for NASAL specimens     only), is one component of a     comprehensive MRSA colonization     surveillance program. It is not     intended to diagnose MRSA     infection nor to guide or     monitor treatment for     MRSA infections.     RESULT CALLED TO, READ BACK BY AND VERIFIED WITH:     CALLED TO RN ARIEL MUHAMMAD [id number] @0041  [name]  CULTURE, BLOOD (ROUTINE X 2)     Status: None   Collection Time    03/16/13  6:23 AM     Result Value Range Status   Specimen Description BLOOD RIGHT HAND   Final   Special Requests BOTTLES DRAWN AEROBIC AND ANAEROBIC 10CC EACH   Final   Culture  Setup Time     Final   Value: 03/16/2013 11:09     Performed at [facility]   Culture     Final   Value: NO GROWTH 5 DAYS     Performed at [facility]   Report Status 03/22/2013 FINAL   Final  CULTURE, BLOOD (ROUTINE X 2)     Status: None   Collection Time    03/16/13  6:33 AM      Result Value Range Status   Specimen Description BLOOD LEFT HAND   Final   Special Requests BOTTLES DRAWN AEROBIC ONLY 8CC   Final   Culture  Setup Time     Final   Value: 03/16/2013 11:09     Performed at [facility]   Culture     Final   Value: METHICILLIN RESISTANT STAPHYLOCOCCUS AUREUS     Note: RIFAMPIN AND GENTAMICIN SHOULD NOT BE USED AS SINGLE DRUGS FOR TREATMENT OF STAPH INFECTIONS. CRITICAL RESULT CALLED TO, READ BACK BY AND VERIFIED WITH: RACHEL LESPERANCE 03/18/13  1345 BY SMITHERSJ     Note: Gram Stain Report Called to,Read Back By and Verified With: DEANNA DILLON ON 03/18/2013 AT 12:30A BY Serafina Mitchell     Performed at [facility]   Report Status 03/19/2013 FINAL   Final   Organism ID, Bacteria METHICILLIN RESISTANT STAPHYLOCOCCUS AUREUS   Final  CULTURE, BLOOD (ROUTINE X 2)     Status: None   Collection Time    03/18/13  4:30 PM      Result Value Range Status   Specimen Description BLOOD RIGHT HAND   Final   Special Requests BOTTLES DRAWN AEROBIC ONLY 10CC   Final   Culture  Setup Time     Final   Value: 03/18/2013 22:25     Performed at [facility]   Culture     Final   Value: NO GROWTH 5 DAYS     Performed at [facility]   Report Status 03/24/2013 FINAL   Final  CULTURE, BLOOD (ROUTINE X 2)     Status: None   Collection Time    03/18/13  4:45 PM      Result Value Range Status   Specimen Description BLOOD LEFT ARM   Final   Special Requests BOTTLES DRAWN AEROBIC ONLY 10CC    Final   Culture  Setup Time     Final   Value: 03/18/2013 22:26     Performed at [facility]   Culture     Final   Value: NO GROWTH 5 DAYS     Performed at [facility]   Report Status 03/24/2013 FINAL   Final  URINE CULTURE     Status: None   Collection Time    03/21/13 10:11 AM      Result Value Range Status   Specimen Description URINE, RANDOM   Final   Special Requests NONE   Final   Culture  Setup Time     Final   Value: 03/21/2013 11:01     Performed at [facility] Count     Final   Value: NO GROWTH     Performed at [facility]   Culture     Final   Value: NO GROWTH     Performed at [facility]   Report Status 03/22/2013 FINAL   Final    Medical History: Past Medical History  Diagnosis Date  . Other diseases of vocal cords   . Osteoporosis, unspecified   . Other and unspecified hyperlipidemia   . Esophageal reflux   . Chronic airway obstruction, not elsewhere classified   . Regional enteritis of unspecified site   . 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 Type II or unspecified type diabetes mellitus without mention of complication, not stated as uncontrolled   . Crohn's disease     Ileostomy  . Pelvic adhesions   . Anxiety   . Depression   . Hypertension   . Shortness of breath   . Pneumonia   . Asthma   . Emphysema   . Hematuria   . Rheumatoid arthritis(714.0) 03/2012    [place]  . Severe cervical dysplasia age 78    cone biopsy with neg paps since    Assessment: 62 y.o. F who presented to the [facility] on 11/[facility] with abdominal pain, chest pain, and fever. The patient was recently admitted on 10/20 and found to have MRSA bacteremia. ID consulted -- work up was negative for infective  endocarditis, the patient was to continue Vancomycin for 2 weeks (thru 11/6) with home health.   Pharmacy was consulted to continue Vancomycin for MRSA bacteremia and start Zosyn for added empiric coverage with  fevers. Per discussion with the patient's sister -- the last dose of Vancomycin was around 1000 this morning. Will check a trough this evening to ensure PTA dosing of 750 mg/12h still appropriate. Wt: 65.6 kg, labs still pending.  Goal of Therapy:  Vancomycin trough level 15-20 mcg/ml Proper antibiotics for infection/cultures adjusted for renal/hepatic function   Plan:  1. Zosyn 3.375 g IV x 1 dose over 30 minutes to load followed by Zosyn 3.375g IV every 8 hours -- will adjust this evening if SCr found to be elevated. 2. Will obtain a Vancomycin trough at 2100 today to address Vancomycin dosing 3. Will continue to follow renal function, culture results, LOT, and antibiotic de-escalation plans   Georgina Pillion, PharmD, BCPS Clinical Pharmacist Pager: [phone] 03/27/2013 3:29 PM

## 2013-03-27 NOTE — ED Notes (Signed)
Report Given to RN 

## 2013-03-27 NOTE — H&P (Signed)
[facility] History and Physical  ZELINA JIMERSON WUJ:[id number] DOB: 04-Jul-1950 DOA: 03/27/2013  Referring physician: EDP PCP: [name], MD  Specialists: Dr Johna Sheriff surgery  Chief Complaint: fever and chills today.   HPI: [name] is a 62 y.o. female recently discharged from the hospital after undergoing laparotomy for adhesions on 10/22. Post op she was found to have MRSA Bacteremia in one set of blood cultures. TTE and TEE did not show any vegetations. She was discharged on IV vancomycin to complete a 14 day course. She got a PICC line before discharge,. She comes in today for fever chills, mild abdominal discomfort and nausea, no vomiting. On arrival to ED, she was found to be febrile , hypotensive, tachycardic, . Labs revealed leukocytosis. She was referred to medical service for admission and surgery consulted. A CT abdomen and pelvis was ordered and pending. CXR negative for pneumonia and UA pending.   Review of Systems: The patient denies  weight loss,, vision loss, decreased hearing, hoarseness, chest pain, syncope, dyspnea on exertion, peripheral edema, balance deficits, hemoptysis, melena, hematochezia, severe indigestion/heartburn, hematuria, incontinence, genital sores, muscle weakness, suspicious skin lesions, transient blindness, difficulty walking, depression, unusual weight change, abnormal bleeding, enlarged lymph nodes, angioedema, and breast masses.    Past Medical History  Diagnosis Date  . Other diseases of vocal cords   . Osteoporosis, unspecified   . Other and unspecified hyperlipidemia   . Esophageal reflux   . Chronic airway obstruction, not elsewhere classified   . Regional enteritis of unspecified site   . 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 Type II or unspecified type diabetes mellitus without mention of complication, not stated as uncontrolled   . Crohn's disease     Ileostomy  . Pelvic adhesions   . Anxiety    . Depression   . Hypertension   . Shortness of breath   . Pneumonia   . Asthma   . Emphysema   . Hematuria   . Rheumatoid arthritis(714.0) 03/2012    [place]  . Severe cervical dysplasia age 64    cone biopsy with neg paps since   Past Surgical History  Procedure Laterality Date  . Ileostomy    . Vaginal hysterectomy  1993    [facility]  . Ear cyst excision    . Cervical cone biopsy  1978  . Colon surgery  2007    subtotal colectomy with ileostomy  . Esophagus stretch    . Oophorectomy      [facility]  . Tracheostomy  2010  . Hammer toe surgery  1980's    Bilateral Feet  . Balloon dilation  02/10/2012    Procedure: BALLOON DILATION;  Surgeon: Charolett Bumpers, MD;  Location: [facility];  Service: Endoscopy;  Laterality: N/A;  . Colposcopy    . Flexible sigmoidoscopy  03/26/2012    Procedure: FLEXIBLE SIGMOIDOSCOPY;  Surgeon: Charolett Bumpers, MD;  Location: [facility];  Service: Endoscopy;  Laterality: N/A;  . Cardiac catheterization  07/08/2007    normal  . Nm myocar perf wall motion  09/21/2006    no ischemia  . Ventral hernia repair N/A 03/16/2013    Procedure: REPAIR OF INCISIONAL AND PARASTOMAL HERNIAS;  Surgeon: Mariella Saa, MD;  Location: [facility] OR;  Service: General;  Laterality: N/A;  . Laparotomy N/A 03/16/2013    Procedure: EXPLORATORY LAPAROTOMY;  Surgeon: Mariella Saa, MD;  Location: [facility] OR;  Service: General;  Laterality: N/A;  . Lysis of adhesion N/A 03/16/2013  Procedure: LYSIS OF ADHESIONS FOR SMALL BOWEL OBSTRUCTION;  Surgeon: Mariella Saa, MD;  Location: [facility] OR;  Service: General;  Laterality: N/A;  . Insertion of mesh N/A 03/16/2013    Procedure: INSERTION OF BIOLOGIC MESH;  Surgeon: Mariella Saa, MD;  Location: [facility] OR;  Service: General;  Laterality: N/A;  . Tee without cardioversion N/A 03/21/2013    Procedure: TRANSESOPHAGEAL ECHOCARDIOGRAM (TEE);  Surgeon: Vesta Mixer, MD;  Location: [facility] ENDOSCOPY;  Service: Cardiovascular;   Laterality: N/A;   Social History:  reports that she quit smoking about 9 years ago. She has never used smokeless tobacco. She reports that she drinks alcohol. She reports that she does not use illicit drugs.  where does patient live--home,  Allergies  Allergen Reactions  . Esomeprazole Magnesium Other (See Comments)    NEXIUM - reaction > aggravated pt's Crohn's disease  . Other     Beans, Dander/Dust, Peas, Mushrooms  . Peanut-Containing Drug Products Other (See Comments)    unknown  . Shellfish Allergy Other (See Comments)    unknown  . Surgical Lubricant Other (See Comments)    Burns skin     Family History  Problem Relation Age of Onset  . Osteoporosis Mother   . Kidney disease Mother   . Hypertension Sister   . Stroke Sister   . Diabetes Sister     Prior to Admission medications   Medication Sig Start Date End Date Taking? Authorizing Provider  arformoterol (BROVANA) 15 MCG/2ML NEBU Take 15 mcg by nebulization 2 (two) times daily.   Yes Historical Provider, MD  aspirin 81 MG tablet Take 81 mg by mouth 3 (three) times a week. Monday, Wednesday, and Friday   Yes Historical Provider, MD  budesonide (PULMICORT) 0.25 MG/2ML nebulizer solution Take 0.25 mg by nebulization 2 (two) times daily.   Yes Historical Provider, MD  celecoxib (CELEBREX) 200 MG capsule Take 200 mg by mouth daily.   Yes Historical Provider, MD  Cholecalciferol (VITAMIN D) 1000 UNITS capsule Take 1,000 Units by mouth daily.    Yes Historical Provider, MD  citalopram (CELEXA) 20 MG tablet Take 20 mg by mouth daily.    Yes Historical Provider, MD  diltiazem (CARDIZEM CD) 180 MG 24 hr capsule Take 180 mg by mouth 2 (two) times daily.   Yes Historical Provider, MD  famotidine (PEPCID) 20 MG tablet Take 20 mg by mouth 2 (two) times daily.   Yes Historical Provider, MD  fluticasone (FLONASE) 50 MCG/ACT nasal spray Place 2 sprays into the nose daily.   Yes Historical Provider, MD  furosemide (LASIX) 20 MG tablet  Take 20 mg by mouth 2 (two) times daily. Once or twice daily as needed for swelling   Yes Historical Provider, MD  guaiFENesin (MUCINEX) 600 MG 12 hr tablet Take 1,200 mg by mouth 2 (two) times daily.    Yes Historical Provider, MD  HYDROcodone-acetaminophen (NORCO/VICODIN) 5-325 MG per tablet Take 2 tablets by mouth every 6 (six) hours as needed for pain.   Yes Historical Provider, MD  hydroxychloroquine (PLAQUENIL) 200 MG tablet Take 1 tablet by mouth Twice daily. 04/20/12  Yes Historical Provider, MD  IRON PO Take 1 tablet by mouth every Monday, Wednesday, and Friday.    Yes Historical Provider, MD  Liraglutide (VICTOZA) 18 MG/3ML SOLN Inject 1.8 mg into the skin daily. 1.8 units once daily   Yes Historical Provider, MD  LORazepam (ATIVAN) 0.5 MG tablet Take 0.5 mg by mouth every 8 (eight) hours as needed  for anxiety.   Yes Historical Provider, MD  metoprolol tartrate (LOPRESSOR) 25 MG tablet Take 12.5 mg by mouth 2 (two) times daily.   Yes Historical Provider, MD  Multiple Vitamin (MULTIVITAMIN WITH MINERALS) TABS Take 1 tablet by mouth daily.   Yes Historical Provider, MD  omeprazole (PRILOSEC) 40 MG capsule Take 40 mg by mouth 2 (two) times daily.    Yes Historical Provider, MD  rosuvastatin (CRESTOR) 5 MG tablet Take 5 mg by mouth every Monday, Wednesday, and Friday.    Yes Historical Provider, MD  tiotropium (SPIRIVA) 18 MCG inhalation capsule Place 18 mcg into inhaler and inhale daily.   Yes Historical Provider, MD  Vancomycin (VANCOCIN) 750 MG/150ML SOLN Inject 750 mg into the vein every 12 (twelve) hours. Continue for 9 more days from 03/22/2013   Yes Historical Provider, MD   Physical Exam: Filed Vitals:   03/27/13 1700  BP: 100/55  Pulse: 111  Temp:   Resp: 28    Constitutional: Vital signs reviewed.  Patient is a well-developed and well-nourished  in no acute distress and cooperative with exam. Alert and oriented x3.  Head: Normocephalic and atraumatic Mouth: no erythema or  exudates, dry MM Eyes: PERRL, EOMI, conjunctivae normal, No scleral icterus.  Neck: Supple, Trachea midline normal ROM, No JVD, mass, thyromegaly, or carotid bruit present.  Cardiovascular: tachycardia, S1 normal, S2 normal, no MRG, pulses symmetric and intact bilaterally Pulmonary/Chest: normal respiratory effort, CTAB, no wheezes,  Abdominal: [email or web address] tender non-distended, bowel sounds are normal,midline staples, with ileostomy. Musculoskeletal: No joint deformities, erythema, or stiffness, ROM full and no nontender Neurological: A&O x3, Strength is normal and symmetric bilaterally, no focal deficits.  Skin: Warm, dry and intact. No rash, cyanosis, or clubbing.      Labs on Admission:  Basic Metabolic Panel:  Recent Labs Lab 03/21/13 0742 03/22/13 0435 03/27/13 1333 03/27/13 1559  NA 141 139 137 141  K 3.3* 3.6 3.2* 3.0*  CL 106 104 100 100  CO2 28 27 27   --   GLUCOSE 97 142* 87 80  BUN 5* 6 8 6   CREATININE 0.85 0.93 1.01 1.20*  CALCIUM 8.3* 8.4 7.9*  --    Liver Function Tests:  Recent Labs Lab 03/27/13 1333  AST 30  ALT 23  ALKPHOS 132*  BILITOT 0.4  PROT 5.7*  ALBUMIN 2.1*   No results found for this basename: LIPASE, AMYLASE,  in the last 168 hours No results found for this basename: AMMONIA,  in the last 168 hours CBC:  Recent Labs Lab 03/22/13 0435 03/27/13 1333 03/27/13 1559  WBC 10.8* 20.7*  --   NEUTROABS  --  18.2*  --   HGB 7.5* 7.6* 7.5*  HCT 23.0* 22.6* 22.0*  MCV 86.5 84.6  --   PLT 201 334  --    Cardiac Enzymes: No results found for this basename: CKTOTAL, CKMB, CKMBINDEX, TROPONINI,  in the last 168 hours  BNP (last 3 results) No results found for this basename: PROBNP,  in the last 8760 hours CBG:  Recent Labs Lab 03/21/13 1655 03/21/13 2127 03/22/13 0743 03/22/13 1215 03/22/13 1641  GLUCAP 86 100* 107* 110* 110*    Radiological Exams on Admission: Dg Chest 2 View  03/27/2013   CLINICAL DATA:  Fever. Shortness  breath. Abdominal pain. Postop or small bowel obstruction and hernia repair.  EXAM: CHEST  2 VIEW  COMPARISON:  03/21/2013  FINDINGS: Right arm PICC line is seen with tip overlying the superior cavoatrial junction.  Pulmonary emphysema again demonstrated. No evidence of pulmonary infiltrate or edema. No evidence of pleural effusion. Heart size is normal. No mass or lymphadenopathy identified. 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  IMPRESSION: New right arm PICC line in appropriate position.  Emphysema. No active disease.   Electronically Signed   By: Myles Rosenthal M.D.   On: 03/27/2013 14:33    EKG: sinus tachy with prolonged QT interval.   Assessment/Plan  1. Fever, chills, tachycardia, hypotension, leukocytosis: Sepsis from unknown source.  - admit to step down - started pt on IV vancomycin and IV zosyn.  - Blood cultures drawn from the PICC line prior to the antibiotics.  - UA done and cultures pending. - CT abdomen and pelvis pending.  - ID consulted.   2. Adhesions s/p laporotomy: - abdominal tenderness.  - CT abd pelvis pending.  - surgery consulted.   3. ANEMIA:  - anemia of chronic disease. Stable.  4. Acute renal failure:  - probably pre renal . Gentle hydration and repeat labs in am.   5. Hypokalemia:  - mag levels ordered.  - EKG showed QT prolongation. No chest pain or sob or dizziness, repeat EKG  In am.  Replete k and mag levels.   6. COPD: - on [place] oxygen at baseline.  - resume home medications.   7. DVT prophylaxis.   8. Diabetes Mellitus: - SSI - start clear liquid diet after the CT abd and pelvis.    Code Status: full code Family Communication: none at bedside. She lives alone, and her sister helps her out.   Disposition Plan: admit to stepd own  Time spent: 80 min  Kooper Godshall [facility] Pager [phone]  If 7PM-7AM, please contact night-coverage [email or web address] Password  [facility] 03/27/2013, 5:15 PM

## 2013-03-27 NOTE — ED Provider Notes (Signed)
CSN: [id number]     Arrival date & time 03/27/13  1240 History  This chart was scribed for Wynetta Emery, PA working with Enid Skeens, MD by Quintella Reichert, ED Scribe. This patient was seen in room A12C/A12C and the patient's care was started at 1:20 PM.   Chief Complaint  Patient presents with  . Fever  . Shortness of Breath  . Abdominal Pain    The history is provided by the patient. No language interpreter was used.    HPI Comments: [name] is a 62 y.o. female with h/o COPD (on home oxygen), Crohn's disease (status post total colectomy at [facility] in 2007), asthma, DM, HTN, and hyperlipidemia who presents to the Emergency Department complaining of abdominal pain, chills, fever, SOB, and nausea. Patient was recently discharged from the hospital on 10/28 status post exploratory laparotomy for SBO performed by Dr. Johna Sheriff on 10/22, patient was febrile in the hospital, blood cultures show conflicting results: some with no growth one grew MRSA.  TEE was negative for endocarditis, patient has PICC line in placed for outpatient vancomycin administration: 750 mg twice a day for 9 days, which Pt has been compliant with.  Pt states she has felt mildly unwell and has had no appetite since she was discharged from the hospital.  She has only been able to tolerate cold water without nausea.  She said she had a good day yesterday and then on waking this morning there was extreme fatigue with fever and chills, mildly increasing abdominal pain to 6/10  (rated a 4/10 yesterday) and called EMS.  When EMS checked her temperature is was 101 F.  On arrival it is 101.9 F.   Pt also complains of intermittent abdominal pain that is brought on by certain movement.  In addition she complains of SOB today.  She denies cough.  Pt has had scant discharge from ostomy.  She has an appointment with her GI doctor within the next several weeks.     Past Medical History  Diagnosis Date  . Other diseases of vocal  cords   . Osteoporosis, unspecified   . Other and unspecified hyperlipidemia   . Esophageal reflux   . Chronic airway obstruction, not elsewhere classified   . Regional enteritis of unspecified site   . 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 Type II or unspecified type diabetes mellitus without mention of complication, not stated as uncontrolled   . Crohn's disease     Ileostomy  . Pelvic adhesions   . Anxiety   . Depression   . Hypertension   . Shortness of breath   . Pneumonia   . Asthma   . Emphysema   . Hematuria   . Rheumatoid arthritis(714.0) 03/2012    [place]  . Severe cervical dysplasia age 45    cone biopsy with neg paps since    Past Surgical History  Procedure Laterality Date  . Ileostomy    . Vaginal hysterectomy  1993    [facility]O  . Ear cyst excision    . Cervical cone biopsy  1978  . Colon surgery  2007    subtotal colectomy with ileostomy  . Esophagus stretch    . Oophorectomy      LSO  . Tracheostomy  2010  . Hammer toe surgery  1980's    Bilateral Feet  . Balloon dilation  02/10/2012    Procedure: BALLOON DILATION;  Surgeon: Charolett Bumpers, MD;  Location: [facility] ENDOSCOPY;  Service: Endoscopy;  Laterality: N/A;  . Colposcopy    . Flexible sigmoidoscopy  03/26/2012    Procedure: FLEXIBLE SIGMOIDOSCOPY;  Surgeon: Charolett Bumpers, MD;  Location: [facility] ENDOSCOPY;  Service: Endoscopy;  Laterality: N/A;  . Cardiac catheterization  07/08/2007    normal  . Nm myocar perf wall motion  09/21/2006    no ischemia  . Ventral hernia repair N/A 03/16/2013    Procedure: REPAIR OF INCISIONAL AND PARASTOMAL HERNIAS;  Surgeon: Mariella Saa, MD;  Location: [facility] OR;  Service: General;  Laterality: N/A;  . Laparotomy N/A 03/16/2013    Procedure: EXPLORATORY LAPAROTOMY;  Surgeon: Mariella Saa, MD;  Location: [facility] OR;  Service: General;  Laterality: N/A;  . Lysis of adhesion N/A 03/16/2013    Procedure: LYSIS OF ADHESIONS FOR SMALL BOWEL  OBSTRUCTION;  Surgeon: Mariella Saa, MD;  Location: [facility] OR;  Service: General;  Laterality: N/A;  . Insertion of mesh N/A 03/16/2013    Procedure: INSERTION OF BIOLOGIC MESH;  Surgeon: Mariella Saa, MD;  Location: [facility] OR;  Service: General;  Laterality: N/A;  . Tee without cardioversion N/A 03/21/2013    Procedure: TRANSESOPHAGEAL ECHOCARDIOGRAM (TEE);  Surgeon: Vesta Mixer, MD;  Location: [facility] ENDOSCOPY;  Service: Cardiovascular;  Laterality: N/A;    Family History  Problem Relation Age of Onset  . Osteoporosis Mother   . Kidney disease Mother   . Hypertension Sister   . Stroke Sister   . Diabetes Sister     History  Substance Use Topics  . Smoking status: Former Smoker -- 1.00 packs/day for 32 years    Quit date: 05/27/2003  . Smokeless tobacco: Never Used  . Alcohol Use: 0.0 oz/week     Comment: occasional    OB History   Grav Para Term Preterm Abortions TAB SAB Ect Mult Living   3 0   3     0      Review of Systems A complete 10 system review of systems was obtained and all systems are negative except as noted in the HPI and PMH.    Allergies  Esomeprazole magnesium; Other; Peanut-containing drug products; Shellfish allergy; and Surgical lubricant  Home Medications   Current Outpatient Rx  Name  Route  Sig  Dispense  Refill  . arformoterol (BROVANA) 15 MCG/2ML NEBU   Nebulization   Take 15 mcg by nebulization 2 (two) times daily.         [name] aspirin 81 MG tablet   Oral   Take 81 mg by mouth 3 (three) times a week. Monday, Wednesday, and Friday         . budesonide (PULMICORT) 0.25 MG/2ML nebulizer solution   Nebulization   Take 0.25 mg by nebulization 2 (two) times daily.         . celecoxib (CELEBREX) 200 MG capsule   Oral   Take 200 mg by mouth daily.         . Cholecalciferol (VITAMIN D) 1000 UNITS capsule   Oral   Take 1,000 Units by mouth daily.          . citalopram (CELEXA) 20 MG tablet   Oral   Take 20 mg by mouth  daily.          [name] diltiazem (CARDIZEM CD) 180 MG 24 hr capsule   Oral   Take 180 mg by mouth 2 (two) times daily.         . famotidine (PEPCID) 20 MG tablet   Oral  Take 20 mg by mouth 2 (two) times daily.         . fluticasone (FLONASE) 50 MCG/ACT nasal spray   Nasal   Place 2 sprays into the nose daily.         . furosemide (LASIX) 20 MG tablet   Oral   Take 20 mg by mouth 2 (two) times daily. Once or twice daily as needed for swelling         . guaiFENesin (MUCINEX) 600 MG 12 hr tablet   Oral   Take 1,200 mg by mouth 2 (two) times daily.          [name] HYDROcodone-acetaminophen (NORCO/VICODIN) 5-325 MG per tablet   Oral   Take 2 tablets by mouth every 6 (six) hours as needed for pain.         . hydroxychloroquine (PLAQUENIL) 200 MG tablet   Oral   Take 1 tablet by mouth Twice daily.         . IRON PO   Oral   Take 1 tablet by mouth every Monday, Wednesday, and Friday.          . Liraglutide (VICTOZA) 18 MG/3ML SOLN   Subcutaneous   Inject 1.8 mg into the skin daily. 1.8 units once daily         . LORazepam (ATIVAN) 0.5 MG tablet   Oral   Take 0.5 mg by mouth every 8 (eight) hours as needed for anxiety.         . metoprolol tartrate (LOPRESSOR) 25 MG tablet   Oral   Take 12.5 mg by mouth 2 (two) times daily.         . Multiple Vitamin (MULTIVITAMIN WITH MINERALS) TABS   Oral   Take 1 tablet by mouth daily.         [name] omeprazole (PRILOSEC) 40 MG capsule   Oral   Take 40 mg by mouth 2 (two) times daily.          . rosuvastatin (CRESTOR) 5 MG tablet   Oral   Take 5 mg by mouth every Monday, Wednesday, and Friday.          . tiotropium (SPIRIVA) 18 MCG inhalation capsule   Inhalation   Place 18 mcg into inhaler and inhale daily.         . Vancomycin (VANCOCIN) 750 MG/150ML SOLN   Intravenous   Inject 750 mg into the vein every 12 (twelve) hours. Continue for 9 more days from 03/22/2013          BP 103/50  Pulse 125   Temp(Src) 101.9 F (38.8 C) (Oral)  Resp 18  SpO2 100%  Physical Exam  HENT:  Head: Normocephalic and atraumatic.  Dry mucous membranes  Eyes: Conjunctivae are normal. Pupils are equal, round, and reactive to light.  Neck: Normal range of motion.  Patient has full range of motion to neck. No tenderness to deep palpation of the posterior cervical spine. Patient can flex chin to chest with no pain.   Cardiovascular: Regular rhythm, normal heart sounds and intact distal pulses.   Tachycardic, no murmurs rubs or gallops appreciated.  Pulmonary/Chest: Effort normal and breath sounds normal. No respiratory distress. She has no wheezes. She has no rales. She exhibits no tenderness.  Poor air movement in all fields, no wheezing appreciated  Abdominal: Soft. Bowel sounds are normal. She exhibits no distension and no mass. There is tenderness. There is no rebound and no guarding.  Surgical incision site  to abdomen with staples in place clean dry and intact, no erythema warmth or discharge.  Bowel sounds are normal, there is minimal diffuse tenderness to palpation with no guarding or rebound.  Musculoskeletal: Normal range of motion. She exhibits no edema.  No calf asymmetry, superficial collaterals, palpable cords, edema, Homans sign negative bilaterally.    Skin: Skin is warm. No rash noted.  Right upper extremity PICC line, dressing left in place, nontender to palpation, no discharge or swelling noted through dressing.    ED Course  Procedures (including critical care time)  CRITICAL CARE Performed by: Wynetta Emery   Total critical care time: 45  Critical care time was exclusive of separately billable procedures and treating other patients.  Critical care was necessary to treat or prevent imminent or life-threatening deterioration.  Critical care was time spent personally by me on the following activities: development of treatment plan with patient and/or surrogate as well as  nursing, discussions with consultants, evaluation of patient's response to treatment, examination of patient, obtaining history from patient or surrogate, ordering and performing treatments and interventions, ordering and review of laboratory studies, ordering and review of radiographic studies, pulse oximetry and re-evaluation of patient's condition.   DIAGNOSTIC STUDIES: Oxygen Saturation is 100% on room air, normal by my interpretation.      Labs Review Labs Reviewed  POCT I-STAT, CHEM 8 - Abnormal; Notable for the following:    Potassium 3.0 (*)    Creatinine, Ser 1.20 (*)    Hemoglobin 7.5 (*)    HCT 22.0 (*)    All other components within normal limits  CULTURE, BLOOD (ROUTINE X 2)  CULTURE, BLOOD (ROUTINE X 2)  URINE CULTURE  CULTURE, BLOOD (ROUTINE X 2)  CULTURE, BLOOD (ROUTINE X 2)  LACTIC ACID, PLASMA  CBC WITH DIFFERENTIAL  COMPREHENSIVE METABOLIC PANEL  URINALYSIS, ROUTINE W REFLEX MICROSCOPIC  VANCOMYCIN, TROUGH  MAGNESIUM    Imaging Review Dg Chest 2 View  03/27/2013   CLINICAL DATA:  Fever. Shortness breath. Abdominal pain. Postop or small bowel obstruction and hernia repair.  EXAM: CHEST  2 VIEW  COMPARISON:  03/21/2013  FINDINGS: Right arm PICC line is seen with tip overlying the superior cavoatrial junction.  Pulmonary emphysema again demonstrated. No evidence of pulmonary infiltrate or edema. No evidence of pleural effusion. Heart size is normal. No mass or lymphadenopathy identified. 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  IMPRESSION: New right arm PICC line in appropriate position.  Emphysema. No active disease.   Electronically Signed   By: Myles Rosenthal M.D.   On: 03/27/2013 14:33    EKG Interpretation   None       MDM   1. SIRS (systemic inflammatory response syndrome)   2. Prolonged Q-T interval on ECG     Filed Vitals:   03/27/13 1241 03/27/13 1300 03/27/13 1526 03/27/13 1551   BP: 103/50  99/71 100/58  Pulse: 125  112 99  Temp: 101.9 F (38.8 C)  100 F (37.8 C)   TempSrc: Oral  Axillary   Resp: 18  16 20   Height:  5' 1.02" (1.55 m)    Weight:  144 lb 10 oz (65.6 kg)    SpO2: 100%  100% 100%     [name] is a 63 y.o. female discharge from the hospital on October 28 status post exploratory laparoscopy for SBO, patient was febrile postop and source was never really found. She was discharged home with PICC  line for IV vancomycin which she has been compliant with. She was feeling reasonably well until this morning she became extremely fatigued with fever and chills and a very slight increase in abdominal pain. Abdominal exam here is benign with no guarding or rebound normal bowel sounds. The surgical incision is clean dry and intact does not appear to be infected, PICC line shows no external signs of infection.  Patient is febrile with a oral temperature of 100.9, she is tachycardic to 125 and blood pressure shows systolic of around 100. Saturation rate with her normal level of supplementation by nasal cannula is 100%. Patient has SIRS with no source at this point. Blood cultures, multiple sites of her PICC line are ordered. Blood work urinalysis CT abdomen pelvis pending. Vanc and zosyn per pharmacy consult, code sepsis initiated.  [facility] consulted, Dr. Lindie Spruce apprised of the situation. He agrees with care plan and they will consult.  On EKG patient has extremely prolonged QT at , potassium is low at 3.0 she will be given 3 runs of 10 mEq via IV. Magnesium pending.  Patient will be admitted to [facility] hospitalist Dr. Blake Divine to a step down bed  Medications  iohexol (OMNIPAQUE) 300 MG/ML solution 25 mL (not administered)  ondansetron (ZOFRAN) injection 4 mg (not administered)  morphine 4 MG/ML injection 4 mg (not administered)  sodium chloride 0.9 % bolus 1,000 mL (1,000 mLs Intravenous New Bag/Given 03/27/13 1553)   piperacillin-tazobactam (ZOSYN) IVPB 3.375 g (not administered)  potassium chloride 10 mEq in 100 mL IVPB (not administered)  sodium chloride 0.9 % bolus 1,000 mL (1,000 mLs Intravenous New Bag/Given 03/27/13 1438)  acetaminophen (TYLENOL) tablet 650 mg (650 mg Oral Given 03/27/13 1438)  piperacillin-tazobactam (ZOSYN) IVPB 3.375 g (0 g Intravenous Stopped 03/27/13 1514)   I personally performed the services described in this documentation, which was scribed in my presence. The recorded information has been reviewed and is accurate.  Note: Portions of this report may have been transcribed using voice recognition software. Every effort was made to ensure accuracy; however, inadvertent computerized transcription errors may be present     Wynetta Emery, PA-C 03/27/13 [address], PA-C 03/27/13 1635

## 2013-03-27 NOTE — Progress Notes (Addendum)
ANTIBIOTIC CONSULT NOTE - FOLLOW UP  Pharmacy Consult for vancomycin Indication: Empiric coverage in setting of fevers   Allergies  Allergen Reactions  . Esomeprazole Magnesium Other (See Comments)    NEXIUM - reaction > aggravated pt's Crohn's disease  . Other     Beans, Dander/Dust, Peas, Mushrooms  . Peanut-Containing Drug Products Other (See Comments)    unknown  . Shellfish Allergy Other (See Comments)    unknown  . Surgical Lubricant Other (See Comments)    Burns skin     Patient Measurements: Height: 5\' 1"  (154.9 cm) Weight: 128 lb 4.9 oz (58.2 kg) IBW/kg (Calculated) : 47.8   Vital Signs: Temp: 98.9 F (37.2 C) (11/02 2017) Temp src: Oral (11/02 2017) BP: 103/71 mmHg (11/02 2017) Pulse Rate: 89 (11/02 2131) Intake/Output from previous day:   Intake/Output from this shift: Total I/O In: 550 [P.O.:200; I.V.:150; IV Piggyback:200] Out: 350 [Urine:150; Stool:200]  Labs:  Recent Labs  03/27/13 1333 03/27/13 1559  WBC 20.7*  --   HGB 7.6* 7.5*  PLT 334  --   CREATININE 1.01 1.20*   Estimated Creatinine Clearance: 39.4 ml/min (by C-G formula based on Cr of 1.2).  Recent Labs  03/27/13 2050  VANCOTROUGH 25.5*     Assessment: 62 y.o. F who presented to the [facility] on 11/2 with abdominal pain, chest pain, and fever. The patient was recently admitted on 10/20 and found to have MRSA bacteremia. ID consulted -- work up was negative for infective endocarditis, the patient was to continue Vancomycin for 2 weeks (thru 11/6) with home health.   A vancomycin trough today is 25.5 on prior regimen of 750mg  IV q12h. SCr= 1.2 and was 0.8-0.9 02/2013.  Goal of Therapy:  Vancomycin trough level 15-20 mcg/ml  Plan:   -Vancomycin 500mg  IV q12h --Will follow renal function, cultures and clinical progress  Harland German, Pharm D 03/27/2013 10:19 PM

## 2013-03-27 NOTE — Consult Note (Signed)
Reason for Consult:Pelvic abscess and abdominal pain Referring Physician: [name] is an 62 y.o. female.  HPI: This patient was just discharged on 10/30 after revision of ileostomy, ventral hernia repair, lysis of adhesion for a SBO--done by Dr. Johna Sheriff.  The patient went home feeling well, ileostomy functioning well.  She had been home for a couple of days when she developed severe abdominal pain with chills, ? Fever. Her WBC was reported as 20K in the ED, subsequent CT scan demonstrated a large pelvic abscess alone with several other areas of fluid collection..  Patient had not been seen postoperatively in the surgeon's office yet and has staples in place.  Past Medical History  Diagnosis Date  . Other diseases of vocal cords   . Osteoporosis, unspecified   . Other and unspecified hyperlipidemia   . Esophageal reflux   . Chronic airway obstruction, not elsewhere classified   . Regional enteritis of unspecified site   . 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 Type II or unspecified type diabetes mellitus without mention of complication, not stated as uncontrolled   . Crohn's disease     Ileostomy  . Pelvic adhesions   . Anxiety   . Depression   . Hypertension   . Shortness of breath   . Pneumonia   . Asthma   . Emphysema   . Hematuria   . Rheumatoid arthritis(714.0) 03/2012    [place]  . Severe cervical dysplasia age 89    cone biopsy with neg paps since    Past Surgical History  Procedure Laterality Date  . Ileostomy    . Vaginal hysterectomy  1993    [facility]  . Ear cyst excision    . Cervical cone biopsy  1978  . Colon surgery  2007    subtotal colectomy with ileostomy  . Esophagus stretch    . Oophorectomy      [facility]  . Tracheostomy  2010  . Hammer toe surgery  1980's    Bilateral Feet  . Balloon dilation  02/10/2012    Procedure: BALLOON DILATION;  Surgeon: Charolett Bumpers, MD;  Location: [facility];  Service: Endoscopy;   Laterality: N/A;  . Colposcopy    . Flexible sigmoidoscopy  03/26/2012    Procedure: FLEXIBLE SIGMOIDOSCOPY;  Surgeon: Charolett Bumpers, MD;  Location: [facility];  Service: Endoscopy;  Laterality: N/A;  . Cardiac catheterization  07/08/2007    normal  . Nm myocar perf wall motion  09/21/2006    no ischemia  . Ventral hernia repair N/A 03/16/2013    Procedure: REPAIR OF INCISIONAL AND PARASTOMAL HERNIAS;  Surgeon: Mariella Saa, MD;  Location: [facility] OR;  Service: General;  Laterality: N/A;  . Laparotomy N/A 03/16/2013    Procedure: EXPLORATORY LAPAROTOMY;  Surgeon: Mariella Saa, MD;  Location: [facility] OR;  Service: General;  Laterality: N/A;  . Lysis of adhesion N/A 03/16/2013    Procedure: LYSIS OF ADHESIONS FOR SMALL BOWEL OBSTRUCTION;  Surgeon: Mariella Saa, MD;  Location: [facility] OR;  Service: General;  Laterality: N/A;  . Insertion of mesh N/A 03/16/2013    Procedure: INSERTION OF BIOLOGIC MESH;  Surgeon: Mariella Saa, MD;  Location: [facility] OR;  Service: General;  Laterality: N/A;  . Tee without cardioversion N/A 03/21/2013    Procedure: TRANSESOPHAGEAL ECHOCARDIOGRAM (TEE);  Surgeon: Vesta Mixer, MD;  Location: [facility] ENDOSCOPY;  Service: Cardiovascular;  Laterality: N/A;    Family History  Problem Relation Age of Onset  .  Osteoporosis Mother   . Kidney disease Mother   . Hypertension Sister   . Stroke Sister   . Diabetes Sister     Social History:  reports that she quit smoking about 9 years ago. She has never used smokeless tobacco. She reports that she drinks alcohol. She reports that she does not use illicit drugs.  Allergies:  Allergies  Allergen Reactions  . Esomeprazole Magnesium Other (See Comments)    NEXIUM - reaction > aggravated pt's Crohn's disease  . Other     Beans, Dander/Dust, Peas, Mushrooms  . Peanut-Containing Drug Products Other (See Comments)    unknown  . Shellfish Allergy Other (See Comments)    unknown  . Surgical Lubricant Other (See  Comments)    Burns skin     Medications: I have reviewed the patient's current medications.  Results for orders placed during the hospital encounter of 03/27/13 (from the past 48 hour(s))  LACTIC ACID, PLASMA     Status: None   Collection Time    03/27/13  1:33 PM      Result Value Range   Lactic Acid, Venous 1.3  0.5 - 2.2 mmol/L  CBC WITH DIFFERENTIAL     Status: Abnormal   Collection Time    03/27/13  1:33 PM      Result Value Range   WBC 20.7 (*) 4.0 - 10.5 K/uL   RBC 2.67 (*) 3.87 - 5.11 MIL/uL   Hemoglobin 7.6 (*) 12.0 - 15.0 g/dL   HCT 16.1 (*) 09.6 - 04.5 %   MCV 84.6  78.0 - 100.0 fL   MCH 28.5  26.0 - 34.0 pg   MCHC 33.6  30.0 - 36.0 g/dL   RDW 40.9  81.1 - 91.4 %   Platelets 334  150 - 400 K/uL   Neutrophils Relative % 88 (*) 43 - 77 %   Neutro Abs 18.2 (*) 1.7 - 7.7 K/uL   Lymphocytes Relative 4 (*) 12 - 46 %   Lymphs Abs 0.8  0.7 - 4.0 K/uL   Monocytes Relative 7  3 - 12 %   Monocytes Absolute 1.4 (*) 0.1 - 1.0 K/uL   Eosinophils Relative 1  0 - 5 %   Eosinophils Absolute 0.3  0.0 - 0.7 K/uL   Basophils Relative 0  0 - 1 %   Basophils Absolute 0.0  0.0 - 0.1 K/uL  COMPREHENSIVE METABOLIC PANEL     Status: Abnormal   Collection Time    03/27/13  1:33 PM      Result Value Range   Sodium 137  135 - 145 mEq/L   Potassium 3.2 (*) 3.5 - 5.1 mEq/L   Chloride 100  96 - 112 mEq/L   CO2 27  19 - 32 mEq/L   Glucose, Bld 87  70 - 99 mg/dL   BUN 8  6 - 23 mg/dL   Creatinine, Ser 7.82  0.50 - 1.10 mg/dL   Calcium 7.9 (*) 8.4 - 10.5 mg/dL   Total Protein 5.7 (*) 6.0 - 8.3 g/dL   Albumin 2.1 (*) 3.5 - 5.2 g/dL   AST 30  0 - 37 U/L   ALT 23  0 - 35 U/L   Alkaline Phosphatase 132 (*) 39 - 117 U/L   Total Bilirubin 0.4  0.3 - 1.2 mg/dL   GFR calc non Af Amer 58 (*) >90 mL/min   GFR calc Af Amer 67 (*) >90 mL/min   Comment: (NOTE)  The eGFR has been calculated using the CKD EPI equation.     This calculation has not been validated in all clinical situations.      eGFR's persistently <90 mL/min signify possible Chronic Kidney     Disease.  URINALYSIS, ROUTINE W REFLEX MICROSCOPIC     Status: Abnormal   Collection Time    03/27/13  1:53 PM      Result Value Range   Color, Urine YELLOW  YELLOW   APPearance TURBID (*) CLEAR   Specific Gravity, Urine 1.017  1.005 - 1.030   pH 6.0  5.0 - 8.0   Glucose, UA NEGATIVE  NEGATIVE mg/dL   Hgb urine dipstick SMALL (*) NEGATIVE   Bilirubin Urine NEGATIVE  NEGATIVE   Ketones, ur 15 (*) NEGATIVE mg/dL   Protein, ur 30 (*) NEGATIVE mg/dL   Urobilinogen, UA 0.2  0.0 - 1.0 mg/dL   Nitrite NEGATIVE  NEGATIVE   Leukocytes, UA SMALL (*) NEGATIVE  URINE MICROSCOPIC-ADD ON     Status: Abnormal   Collection Time    03/27/13  1:53 PM      Result Value Range   Squamous Epithelial / LPF FEW (*) RARE   WBC, UA 3-6  <3 WBC/hpf   RBC / HPF 3-6  <3 RBC/hpf   Bacteria, UA FEW (*) RARE   Urine-Other AMORPHOUS URATES/PHOSPHATES    MAGNESIUM     Status: None   Collection Time    03/27/13  3:50 PM      Result Value Range   Magnesium 1.6  1.5 - 2.5 mg/dL  PROCALCITONIN     Status: None   Collection Time    03/27/13  3:58 PM      Result Value Range   Procalcitonin 168.24     Comment:            Interpretation:     PCT >= 10 ng/mL:     Important systemic inflammatory response,     almost exclusively due to severe bacterial     sepsis or septic shock.     (NOTE)             ICU PCT Algorithm               Non ICU PCT Algorithm        ----------------------------     ------------------------------             PCT < 0.25 ng/mL                 PCT < 0.1 ng/mL         Stopping of antibiotics            Stopping of antibiotics           strongly encouraged.               strongly encouraged.        ----------------------------     ------------------------------           PCT level decrease by               PCT < 0.25 ng/mL           >= 80% from peak PCT           OR PCT 0.25 - 0.5 ng/mL          Stopping of antibiotics  encouraged.         Stopping of antibiotics               encouraged.        ----------------------------     ------------------------------           PCT level decrease by              PCT >= 0.25 ng/mL           < 80% from peak PCT            AND PCT >= 0.5 ng/mL            Continuing antibiotics                                                  encouraged.           Continuing antibiotics                encouraged.        ----------------------------     ------------------------------         PCT level increase compared          PCT > 0.5 ng/mL             with peak PCT AND              PCT >= 0.5 ng/mL             Escalation of antibiotics                                              strongly encouraged.          Escalation of antibiotics            strongly encouraged.  POCT I-STAT, CHEM 8     Status: Abnormal   Collection Time    03/27/13  3:59 PM      Result Value Range   Sodium 141  135 - 145 mEq/L   Potassium 3.0 (*) 3.5 - 5.1 mEq/L   Chloride 100  96 - 112 mEq/L   BUN 6  6 - 23 mg/dL   Creatinine, Ser 1.61 (*) 0.50 - 1.10 mg/dL   Glucose, Bld 80  70 - 99 mg/dL   Calcium, Ion 0.96  0.45 - 1.30 mmol/L   TCO2 30  0 - 100 mmol/L   Hemoglobin 7.5 (*) 12.0 - 15.0 g/dL   HCT 40.9 (*) 81.1 - 91.4 %    Dg Chest 2 View  03/27/2013   CLINICAL DATA:  Fever. Shortness breath. Abdominal pain. Postop or small bowel obstruction and hernia repair.  EXAM: CHEST  2 VIEW  COMPARISON:  03/21/2013  FINDINGS: Right arm PICC line is seen with tip overlying the superior cavoatrial junction.  Pulmonary emphysema again demonstrated. No evidence of pulmonary infiltrate or edema. No evidence of pleural effusion. Heart size is normal. No mass or lymphadenopathy identified. 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  IMPRESSION: New right arm PICC line in appropriate position.  Emphysema. No  active disease.   Electronically Signed   By: Myles Rosenthal M.D.   On: 03/27/2013 14:33   Ct  Abdomen Pelvis W Contrast  03/27/2013   CLINICAL DATA:  62 year old female with postoperative fever abdominal pain nausea and vomiting. Recent small bowel obstruction and status postoperative laparotomy for adhesions on 03/16/2013. MRSA bacteremia. Hypotension. Initial encounter.  EXAM: CT ABDOMEN AND PELVIS WITH CONTRAST  TECHNIQUE: Multidetector CT imaging of the abdomen and pelvis was performed using the standard protocol following bolus administration of intravenous contrast.  CONTRAST:  OMNIPAQUE IOHEXOL 300 MG/ML  SOLN  COMPARISON:  Abdominal radiographs 03/16/2013. Preoperative CT Abdomen and Pelvis 03/14/13.  FINDINGS: Negative lung bases. No pericardial or pleural effusion. No acute osseous abnormality identified.  Chronic changes of subtotal colectomy. Blind-ending distal colon segment, rectum and sigmoid, appears stable.  New rim enhancing fluid collection in the deep lower abdomen and pelvis encompassing 8.3 x 6.0 x 10 cm. There appears to be a small amount of mesenteric fat associated with this collection. Oral contrast was administered, but has not significantly opacified the distal small bowel. Postoperative changes to the ventral abdomen with skin staples in place. More simple appearing fluid tracking along the subcutaneous incision site (series 2, image 38 - 57). Near to the right abdominal ostomy there is a small 17 mm rim enhancing collection suspected (image 41). Subjacent to the right ventral abdominal wall at the level of surgery there is a crescentic rim enhancing collection (image 34) encompassing 5.2 x 0.9 x 2.2 cm.  No pneumoperitoneum. Mildly dilated mid small bowel loops measuring up to 33 mm diameter. Transition seems to be in the area of the largest abscess.  Mild periportal edema in the liver. No discrete liver lesion. Gallbladder with small Phrygian cap again noted. The gallbladder  fundus is located near the crescentic abscess, but not definitely involved. Portal venous system appears patent. Aortoiliac calcified atherosclerosis noted. Major arterial structures in the abdomen and pelvis are patent. Negative bladder. Possible small urethral diverticulum. Surgical clips in the epigastric region again noted. Spleen, pancreas and adrenal glands remain normal. Kidneys are stable. Right mid pole nephrolithiasis. No renal obstruction.  IMPRESSION: 1. Recent postoperative changes with a fairly large rim enhancing fluid collection in the deep lower abdomen and pelvis most compatible with abscess. See series 2, image 59. Smaller abscesses in the right upper quadrant near the ostomy (images 38, and 41). More simple appearing postoperative subcutaneous fluid along the incision site, likely seroma. No pneumoperitoneum.  2.  Mildly dilated mid small bowel loops, favor ileus.  Study discussed by telephone with hospitalist provider L. Easterwood on 03/27/2013 at 19:10 .   Electronically Signed   By: Augusto Gamble M.D.   On: 03/27/2013 19:14    Review of Systems  Constitutional: Positive for fever (questionable) and chills.  HENT: Negative.   Eyes: Negative.   Gastrointestinal: Positive for abdominal pain.       Poor appetite  Skin: Negative.   Neurological: Positive for weakness.   Blood pressure 103/71, pulse 107, temperature 98.9 F (37.2 C), temperature source Oral, resp. rate 20, height 5\' 1"  (1.549 m), weight 58.2 kg (128 lb 4.9 oz), SpO2 100.00%. Physical Exam  Constitutional: Vital signs are normal. She appears well-developed and well-nourished. She is cooperative.  HENT:  Head: Normocephalic and atraumatic.  Eyes: Conjunctivae and EOM are normal. Pupils are equal, round, and reactive to light.  Neck: Normal range of motion. Neck supple.  Cardiovascular: Normal rate, regular rhythm and normal heart sounds.   GI: Soft. She exhibits distension (mildly distended). There is tenderness in  the suprapubic area and left  lower quadrant. There is rigidity, rebound and guarding.    Musculoskeletal: Normal range of motion.  Neurological: She is alert.  Skin: Skin is warm and dry.  Psychiatric: Her behavior is normal. Judgment and thought content normal.    Assessment/Plan: S/P enterolysis, ventral hernia repair with biological lprosthetic, revision of ostomy with removal of peri-stomal hernia.  Now readmitted with pelvic abscess, possible wound infection, and possible open drainage of multiple abdominal fluid collections.  Will allow patient to have clear liquids until 0400 tomorrow AM Will place order for percutaneous drainage of pelvic abscess. Wound fludi collection can be drained at the bedside. IV antibiotic for anaerobic and enteric bugs.  Cherylynn Ridges 03/27/2013, 8:54 PM

## 2013-03-27 NOTE — ED Notes (Signed)
Notified CT Scan that pt finished with contrast

## 2013-03-27 NOTE — ED Provider Notes (Signed)
Medical screening examination/treatment/procedure(s) were conducted as a shared visit with non-physician practitioner(s) or resident  and myself.  I personally evaluated the patient during the encounter and agree with the findings and plan unless otherwise indicated.    I have personally reviewed any xrays and/ or EKG's with the provider and I agree with interpretation.   Worsening abd pain central/ sharp and fevers/ chills and mild confusion despite being on IV abx.  Recent admission.  Dry mm, abd pain surrounding incision which is clear/ no drainage or erythema. Lungs clear. CNs grossly intact.  Tachy, wbc elevated/ fever- sepsis code called.  Broad abx/ fluids and CT abdomen/pelvis.  Pain meds given.  Pt stable on recheck.  K and Ca given.  EKG prolonged QT, pt on a monitor, Mag sent.  EKG reviewed, prolonged qt.  In MUSE.   Date: 03/27/2013  Rate: 121  Rhythm: sinus tachycardia  QRS Axis: normal  Intervals: QT prolonged  ST/T Wave abnormalities: nonspecific ST changes  Conduction Disutrbances:none  Narrative Interpretation:   Old EKG Reviewed: changes noted   Hypokalemia, Abdominal pain, Sepsis, hypoCA, Prolonged QT  Labs Reviewed  CBC WITH DIFFERENTIAL - Abnormal; Notable for the following:    WBC 20.7 (*)    RBC 2.67 (*)    Hemoglobin 7.6 (*)    HCT 22.6 (*)    Neutrophils Relative % 88 (*)    Neutro Abs 18.2 (*)    Lymphocytes Relative 4 (*)    Monocytes Absolute 1.4 (*)    All other components within normal limits  COMPREHENSIVE METABOLIC PANEL - Abnormal; Notable for the following:    Potassium 3.2 (*)    Calcium 7.9 (*)    Total Protein 5.7 (*)    Albumin 2.1 (*)    Alkaline Phosphatase 132 (*)    GFR calc non Af Amer 58 (*)    GFR calc Af Amer 67 (*)    All other components within normal limits  URINALYSIS, ROUTINE W REFLEX MICROSCOPIC - Abnormal; Notable for the following:    APPearance TURBID (*)    Hgb urine dipstick SMALL (*)    Ketones, ur 15 (*)    Protein,  ur 30 (*)    Leukocytes, UA SMALL (*)    All other components within normal limits  URINE MICROSCOPIC-ADD ON - Abnormal; Notable for the following:    Squamous Epithelial / LPF FEW (*)    Bacteria, UA FEW (*)    All other components within normal limits  POCT I-STAT, CHEM 8 - Abnormal; Notable for the following:    Potassium 3.0 (*)    Creatinine, Ser 1.20 (*)    Hemoglobin 7.5 (*)    HCT 22.0 (*)    All other components within normal limits  CULTURE, BLOOD (ROUTINE X 2)  URINE CULTURE  CULTURE, BLOOD (ROUTINE X 2)  CULTURE, BLOOD (ROUTINE X 2)  LACTIC ACID, PLASMA  MAGNESIUM  VANCOMYCIN, TROUGH     Enid Skeens, MD 03/27/13 1729

## 2013-03-27 NOTE — ED Notes (Signed)
Receiving RN aware that pro calcitonin needs to be drawn

## 2013-03-28 ENCOUNTER — Telehealth (INDEPENDENT_AMBULATORY_CARE_PROVIDER_SITE_OTHER): Payer: Self-pay | Admitting: General Surgery

## 2013-03-28 ENCOUNTER — Inpatient Hospital Stay (HOSPITAL_COMMUNITY): Payer: Medicare Other

## 2013-03-28 ENCOUNTER — Telehealth (INDEPENDENT_AMBULATORY_CARE_PROVIDER_SITE_OTHER): Payer: Self-pay

## 2013-03-28 ENCOUNTER — Telehealth: Payer: Self-pay | Admitting: Critical Care Medicine

## 2013-03-28 DIAGNOSIS — Z113 Encounter for screening for infections with a predominantly sexual mode of transmission: Secondary | ICD-10-CM

## 2013-03-28 DIAGNOSIS — N739 Female pelvic inflammatory disease, unspecified: Secondary | ICD-10-CM | POA: Diagnosis present

## 2013-03-28 DIAGNOSIS — A4902 Methicillin resistant Staphylococcus aureus infection, unspecified site: Secondary | ICD-10-CM

## 2013-03-28 DIAGNOSIS — R6510 Systemic inflammatory response syndrome (SIRS) of non-infectious origin without acute organ dysfunction: Secondary | ICD-10-CM

## 2013-03-28 DIAGNOSIS — D649 Anemia, unspecified: Secondary | ICD-10-CM | POA: Diagnosis present

## 2013-03-28 DIAGNOSIS — R7881 Bacteremia: Secondary | ICD-10-CM

## 2013-03-28 DIAGNOSIS — A419 Sepsis, unspecified organism: Secondary | ICD-10-CM | POA: Diagnosis present

## 2013-03-28 DIAGNOSIS — D72829 Elevated white blood cell count, unspecified: Secondary | ICD-10-CM | POA: Diagnosis present

## 2013-03-28 DIAGNOSIS — K651 Peritoneal abscess: Secondary | ICD-10-CM

## 2013-03-28 LAB — GLUCOSE, CAPILLARY
Glucose-Capillary: 86 mg/dL (ref 70–99)
Glucose-Capillary: 128 mg/dL — ABNORMAL HIGH (ref 70–99)
Glucose-Capillary: 90 mg/dL (ref 70–99)
Glucose-Capillary: 90 mg/dL (ref 70–99)

## 2013-03-28 LAB — CBC
HCT: 19.2 % — ABNORMAL LOW (ref 36.0–46.0)
Hemoglobin: 6.4 g/dL — CL (ref 12.0–15.0)
MCH: 27.9 pg (ref 26.0–34.0)
MCHC: 33.3 g/dL (ref 30.0–36.0)
MCV: 83.8 fL (ref 78.0–100.0)
RBC: 2.29 MIL/uL — ABNORMAL LOW (ref 3.87–5.11)
RDW: 13.8 % (ref 11.5–15.5)

## 2013-03-28 LAB — PREPARE RBC (CROSSMATCH)

## 2013-03-28 LAB — BASIC METABOLIC PANEL
BUN: 6 mg/dL (ref 6–23)
Chloride: 106 mEq/L (ref 96–112)
Creatinine, Ser: 1 mg/dL (ref 0.50–1.10)
GFR calc Af Amer: 68 mL/min — ABNORMAL LOW (ref >90)
Glucose, Bld: 110 mg/dL — ABNORMAL HIGH (ref 70–99)
Potassium: 3.4 mEq/L — ABNORMAL LOW (ref 3.5–5.1)

## 2013-03-28 LAB — URINE CULTURE: Colony Count: 15000

## 2013-03-28 LAB — PROTIME-INR: INR: 1.16 (ref 0.00–1.49)

## 2013-03-28 LAB — PROCALCITONIN: Procalcitonin: 124.47 ng/mL

## 2013-03-28 LAB — APTT: aPTT: 45 seconds — ABNORMAL HIGH (ref 24–37)

## 2013-03-28 MED ORDER — LIDOCAINE HCL 1 % IJ SOLN
INTRAMUSCULAR | Status: AC
  Filled 2013-03-28: qty 10

## 2013-03-28 MED ORDER — MIDAZOLAM HCL 2 MG/2ML IJ SOLN
INTRAMUSCULAR | Status: AC | PRN
  Administered 2013-03-28 (×2): 2 mg via INTRAVENOUS

## 2013-03-28 MED ORDER — FENTANYL CITRATE 0.05 MG/ML IJ SOLN
INTRAMUSCULAR | Status: AC | PRN
  Administered 2013-03-28 (×3): 50 ug via INTRAVENOUS

## 2013-03-28 MED ORDER — ACETAMINOPHEN 325 MG PO TABS
650.0000 mg | ORAL_TABLET | Freq: Four times a day (QID) | ORAL | Status: DC | PRN
  Administered 2013-03-30: 650 mg via ORAL
  Filled 2013-03-28 (×3): qty 2

## 2013-03-28 MED ORDER — FENTANYL CITRATE 0.05 MG/ML IJ SOLN
INTRAMUSCULAR | Status: AC
  Filled 2013-03-28: qty 4

## 2013-03-28 MED ORDER — MIDAZOLAM HCL 2 MG/2ML IJ SOLN
INTRAMUSCULAR | Status: AC
  Filled 2013-03-28: qty 4

## 2013-03-28 NOTE — Telephone Encounter (Signed)
Patient's sister calling in to make [place] aware that patient is back in the hospital. She states yesterday she had to call 911 for her sister because she was having severe chills and breathing problems. She states she was told that the MRSA infection has made her septic. She is currently at [facility]. She is status post small bowel obstruction and hernia repair by Dr Johna Sheriff. I told her I would make him aware.

## 2013-03-28 NOTE — Progress Notes (Signed)
CRITICAL VALUE ALERT  Critical value received:  Hemoglobin 6.4  Date of notification:  03/28/2013   Time of notification:  0732  Critical value read back: yes  Nurse who received alert:  Birdena Crandall RN  MD notified (1st page): Dr. Sharon Seller  Time of first page: [phone]  MD notified (2nd page):  Time of second page:  Responding MD:    Time MD responded:

## 2013-03-28 NOTE — Progress Notes (Signed)
Subjective: Complaining of abdominal pain this morning  Objective: Vital signs in last 24 hours: Temp:  [98 F (36.7 C)-101.9 F (38.8 C)] 98 F (36.7 C) (11/03 0811) Pulse Rate:  [89-125] 115 (11/03 0811) Resp:  [12-36] 36 (11/03 0811) BP: (89-125)/(44-90) 103/46 mmHg (11/03 0811) SpO2:  [92 %-100 %] 94 % (11/03 0811) FiO2 (%):  [100 %] 100 % (11/02 2131) Weight:  [128 lb 4.9 oz (58.2 kg)-144 lb 10 oz (65.6 kg)] 128 lb 4.9 oz (58.2 kg) (11/02 1919) Last BM Date: 03/27/13  Intake/Output from previous day: 11/02 0701 - 11/03 0700 In: 1947.5 [P.O.:420; I.V.:1127.5; IV Piggyback:400] Out: 450 [Urine:250; Stool:200] Intake/Output this shift:    Abdominal incision well healed.  No erythema.  I inserted an 18 gauge needle into the middle line fluid collection which appeared consistent with a seroma with no purulence or odor. Rest of abdomen soft.  Lab Results:   Recent Labs  03/27/13 1333 03/27/13 1559 03/28/13 0500  WBC 20.7*  --  13.6*  HGB 7.6* 7.5* 6.4*  HCT 22.6* 22.0* 19.2*  PLT 334  --  366   BMET  Recent Labs  03/27/13 1333 03/27/13 1559 03/28/13 0500  NA 137 141 139  K 3.2* 3.0* 3.4*  CL 100 100 106  CO2 27  --  24  GLUCOSE 87 80 110*  BUN 8 6 6   CREATININE 1.01 1.20* 1.00  CALCIUM 7.9*  --  7.6*   PT/INR No results found for this basename: LABPROT, INR,  in the last 72 hours ABG No results found for this basename: PHART, PCO2, PO2, HCO3,  in the last 72 hours  Studies/Results: Dg Chest 2 View  03/27/2013   CLINICAL DATA:  Fever. Shortness breath. Abdominal pain. Postop or small bowel obstruction and hernia repair.  EXAM: CHEST  2 VIEW  COMPARISON:  03/21/2013  FINDINGS: Right arm PICC line is seen with tip overlying the superior cavoatrial junction.  Pulmonary emphysema again demonstrated. No evidence of pulmonary infiltrate or edema. No evidence of pleural effusion. Heart size is normal. No mass or lymphadenopathy identified. 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  IMPRESSION: New right arm PICC line in appropriate position.  Emphysema. No active disease.   Electronically Signed   By: Myles Rosenthal M.D.   On: 03/27/2013 14:33   Ct Abdomen Pelvis W Contrast  03/27/2013   CLINICAL DATA:  62 year old female with postoperative fever abdominal pain nausea and vomiting. Recent small bowel obstruction and status postoperative laparotomy for adhesions on 03/16/2013. MRSA bacteremia. Hypotension. Initial encounter.  EXAM: CT ABDOMEN AND PELVIS WITH CONTRAST  TECHNIQUE: Multidetector CT imaging of the abdomen and pelvis was performed using the standard protocol following bolus administration of intravenous contrast.  CONTRAST:  OMNIPAQUE IOHEXOL 300 MG/ML  SOLN  COMPARISON:  Abdominal radiographs 03/16/2013. Preoperative CT Abdomen and Pelvis 03/14/13.  FINDINGS: Negative lung bases. No pericardial or pleural effusion. No acute osseous abnormality identified.  Chronic changes of subtotal colectomy. Blind-ending distal colon segment, rectum and sigmoid, appears stable.  New rim enhancing fluid collection in the deep lower abdomen and pelvis encompassing 8.3 x 6.0 x 10 cm. There appears to be a small amount of mesenteric fat associated with this collection. Oral contrast was administered, but has not significantly opacified the distal small bowel. Postoperative changes to the ventral abdomen with skin staples in place. More simple appearing fluid tracking along the subcutaneous incision site (series 2, image 38 -  57). Near to the right abdominal ostomy there is a small 17 mm rim enhancing collection suspected (image 41). Subjacent to the right ventral abdominal wall at the level of surgery there is a crescentic rim enhancing collection (image 34) encompassing 5.2 x 0.9 x 2.2 cm.  No pneumoperitoneum. Mildly dilated mid small bowel loops measuring up to 33 mm diameter. Transition seems to be in  the area of the largest abscess.  Mild periportal edema in the liver. No discrete liver lesion. Gallbladder with small Phrygian cap again noted. The gallbladder fundus is located near the crescentic abscess, but not definitely involved. Portal venous system appears patent. Aortoiliac calcified atherosclerosis noted. Major arterial structures in the abdomen and pelvis are patent. Negative bladder. Possible small urethral diverticulum. Surgical clips in the epigastric region again noted. Spleen, pancreas and adrenal glands remain normal. Kidneys are stable. Right mid pole nephrolithiasis. No renal obstruction.  IMPRESSION: 1. Recent postoperative changes with a fairly large rim enhancing fluid collection in the deep lower abdomen and pelvis most compatible with abscess. See series 2, image 59. Smaller abscesses in the right upper quadrant near the ostomy (images 38, and 41). More simple appearing postoperative subcutaneous fluid along the incision site, likely seroma. No pneumoperitoneum.  2.  Mildly dilated mid small bowel loops, favor ileus.  Study discussed by telephone with hospitalist provider L. Easterwood on 03/27/2013 at 19:10 .   Electronically Signed   By: Augusto Gamble M.D.   On: 03/27/2013 19:14    Anti-infectives: Anti-infectives   Start     Dose/Rate Route Frequency Ordered Stop   03/27/13 2200  piperacillin-tazobactam (ZOSYN) IVPB 3.375 g     3.375 g 12.5 mL/hr over 240 Minutes Intravenous 3 times per day 03/27/13 1530     03/27/13 2200  hydroxychloroquine (PLAQUENIL) tablet 200 mg     200 mg Oral 2 times daily 03/27/13 1915     03/27/13 1430  piperacillin-tazobactam (ZOSYN) IVPB 3.375 g     3.375 g 100 mL/hr over 30 Minutes Intravenous  Once 03/27/13 1415 03/27/13 1514   03/27/13 0300  vancomycin (VANCOCIN) 500 mg in sodium chloride 0.9 % 100 mL IVPB     500 mg 100 mL/hr over 60 Minutes Intravenous Every 12 hours 03/27/13 2351     03/27/13 0100  vancomycin (VANCOCIN) 500 mg in sodium  chloride 0.9 % 100 mL IVPB  Status:  Discontinued     500 mg 100 mL/hr over 60 Minutes Intravenous Every 12 hours 03/27/13 2227 03/27/13 2340      Assessment/Plan: s/p * No surgery found *  Post op pelvic abscess --IR has been consulted for possible drain Midline fluid collections --suspect seroma.  Still may need to open wound Anemia --transfuse PRBC's.  Patient agrees. Remove midline staples. If drain not successful, will need laparotomy  LOS: 1 day    [name] A 03/28/2013

## 2013-03-28 NOTE — Progress Notes (Signed)
UR Completed.  [name] [phone] 03/28/2013

## 2013-03-28 NOTE — Progress Notes (Signed)
To x-ray

## 2013-03-28 NOTE — H&P (Signed)
Plan for pelvic abscess drain today

## 2013-03-28 NOTE — Progress Notes (Signed)
Ok to transfuse blood per Dr. Sharon Seller.

## 2013-03-28 NOTE — Progress Notes (Signed)
Received back from Xray.

## 2013-03-28 NOTE — Telephone Encounter (Signed)
Called patient with po appt date & time.  Spoke to [place], patient currently hospitalized.  Sharyn Blitz (ER contact for patient) given post op appointment for 04/08/13 @ 4:45 pm w/Dr. Johna Sheriff.  I advised Sharyn Blitz that we will make changes to the post op appointment if needed after patient is discharged from hospital.

## 2013-03-28 NOTE — Progress Notes (Signed)
Staples removed from abd. Incision. 1 suture left diff. To remove. Night shift nurse will attempt to remove.

## 2013-03-28 NOTE — Consult Note (Signed)
[facility]    Date of Admission:  03/27/2013  Date of Consult:  03/28/2013  Reason for Consult:FEVER, SEPsis Referring Physician: Dr. Sharon Seller   HPI: [name] is an 62 y.o. female recently discharged from the hospital after undergoing laparotomy for adhesions on 10/22. Post op she was found to have MRSA Bacteremia in one set of blood cultures. TTE and TEE did not show any vegetations. She was discharged on IV vancomycin to complete a 14 day course. She got a PICC line before discharge,. She comes in on 03/27/13 for fever chills, mild abdominal discomfort and nausea, no vomiting. On arrival to ED, she was found to be febrile , hypotensive, tachycardic, . Blood cultures drawn and zosyn added to her vancomycin CT done shows:  " 1. Recent postoperative changes with a fairly large rim enhancing  fluid collection in the deep lower abdomen and pelvis most  compatible with abscess. See series 2, image 59. Smaller abscesses  in the right upper quadrant near the ostomy (images 38, and 41).  More simple appearing postoperative subcutaneous fluid along the  incision site, likely seroma. No pneumoperitoneum.  2. Mildly dilated mid small bowel loops, "   She is feeling better today and scheduled for surgery later today.   Past Medical History  Diagnosis Date  . Other diseases of vocal cords   . Osteoporosis, unspecified   . Other and unspecified hyperlipidemia   . Esophageal reflux   . Chronic airway obstruction, not elsewhere classified   . Regional enteritis of unspecified site   . 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 Type II or unspecified type diabetes mellitus without mention of complication, not stated as uncontrolled   . Crohn's disease     Ileostomy  . Pelvic adhesions   . Anxiety   . Depression   . Hypertension   . Shortness of breath   . Pneumonia   . Asthma   . Emphysema   . Hematuria   . Rheumatoid  arthritis(714.0) 03/2012    [place]  . Severe cervical dysplasia age 49    cone biopsy with neg paps since    Past Surgical History  Procedure Laterality Date  . Ileostomy    . Vaginal hysterectomy  1993    [facility]-LSO  . Ear cyst excision    . Cervical cone biopsy  1978  . Colon surgery  2007    subtotal colectomy with ileostomy  . Esophagus stretch    . Oophorectomy      LSO  . Tracheostomy  2010  . Hammer toe surgery  1980's    Bilateral Feet  . Balloon dilation  02/10/2012    Procedure: BALLOON DILATION;  Surgeon: Charolett Bumpers, MD;  Location: [facility];  Service: Endoscopy;  Laterality: N/A;  . Colposcopy    . Flexible sigmoidoscopy  03/26/2012    Procedure: FLEXIBLE SIGMOIDOSCOPY;  Surgeon: Charolett Bumpers, MD;  Location: [facility];  Service: Endoscopy;  Laterality: N/A;  . Cardiac catheterization  07/08/2007    normal  . Nm myocar perf wall motion  09/21/2006    no ischemia  . Ventral hernia repair N/A 03/16/2013    Procedure: REPAIR OF INCISIONAL AND PARASTOMAL HERNIAS;  Surgeon: Mariella Saa, MD;  Location: [facility] OR;  Service: General;  Laterality: N/A;  . Laparotomy N/A 03/16/2013    Procedure: EXPLORATORY LAPAROTOMY;  Surgeon: Mariella Saa, MD;  Location: [facility] OR;  Service: General;  Laterality: N/A;  .  Lysis of adhesion N/A 03/16/2013    Procedure: LYSIS OF ADHESIONS FOR SMALL BOWEL OBSTRUCTION;  Surgeon: Mariella Saa, MD;  Location: [facility] OR;  Service: General;  Laterality: N/A;  . Insertion of mesh N/A 03/16/2013    Procedure: INSERTION OF BIOLOGIC MESH;  Surgeon: Mariella Saa, MD;  Location: [facility] OR;  Service: General;  Laterality: N/A;  . Tee without cardioversion N/A 03/21/2013    Procedure: TRANSESOPHAGEAL ECHOCARDIOGRAM (TEE);  Surgeon: Vesta Mixer, MD;  Location[facility]COPY;  Service: Cardiovascular;  Laterality: N/A;  ergies:   Allergies  Allergen Reactions  . Esomeprazole Magnesium Other (See Comments)    NEXIUM - reaction  > aggravated pt's Crohn's disease  . Other     Beans, Dander/Dust, Peas, Mushrooms  . Peanut-Containing Drug Products Other (See Comments)    unknown  . Shellfish Allergy Other (See Comments)    unknown  . Surgical Lubricant Other (See Comments)    Burns skin      Medications: I have reviewed patients current medications as documented in Epic Anti-infectives   Start     Dose/Rate Route Frequency Ordered Stop   03/27/13 2200  piperacillin-tazobactam (ZOSYN) IVPB 3.375 g     3.375 g 12.5 mL/hr over 240 Minutes Intravenous 3 times per day 03/27/13 1530     03/27/13 2200  hydroxychloroquine (PLAQUENIL) tablet 200 mg     200 mg Oral 2 times daily 03/27/13 1915     03/27/13 1430  piperacillin-tazobactam (ZOSYN) IVPB 3.375 g     3.375 g 100 mL/hr over 30 Minutes Intravenous  Once 03/27/13 1415 03/27/13 1514   03/27/13 0300  vancomycin (VANCOCIN) 500 mg in sodium chloride 0.9 % 100 mL IVPB     500 mg 100 mL/hr over 60 Minutes Intravenous Every 12 hours 03/27/13 2351     03/27/13 0100  vancomycin (VANCOCIN) 500 mg in sodium chloride 0.9 % 100 mL IVPB  Status:  Discontinued     500 mg 100 mL/hr over 60 Minutes Intravenous Every 12 hours 03/27/13 2227 03/27/13 2340      Social History:  reports that she quit smoking about 9 years ago. She has never used smokeless tobacco. She reports that she drinks alcohol. She reports that she does not use illicit drugs.  Family History  Problem Relation Age of Onset  . Osteoporosis Mother   . Kidney disease Mother   . Hypertension Sister   . Stroke Sister   . Diabetes Sister     As in HPI and primary teams notes otherwise 12 point review of systems is negative  Blood pressure 96/52, pulse 110, temperature 98 F (36.7 C), temperature source Oral, resp. rate 36, height 5\' 1"  (1.549 m), weight 128 lb 4.9 oz (58.2 kg), SpO2 94.00%. General: Alert and awake, oriented x3, not in any acute distress. HEENT: anicteric sclera, pupils reactive to light  and accommodation, EOMI, oropharynx clear and without exudate CVS regular rate, normal r,  no murmur rubs or gallops Chest: clear to auscultation bilaterally, no wheezing, rales or rhonchi Abdomen: Midline incision is clean with granulation tissue present ostomy site with stool.  Extremities: no  clubbing or edema noted bilaterally Skin: no rashes Neuro: nonfocal, strength and sensation intact   Results for orders placed during the hospital encounter of 03/27/13 (from the past 48 hour(s))  LACTIC ACID, PLASMA     Status: None   Collection Time    03/27/13  1:33 PM      Result Value Range  Lactic Acid, Venous 1.3  0.5 - 2.2 mmol/L  CBC WITH DIFFERENTIAL     Status: Abnormal   Collection Time    03/27/13  1:33 PM      Result Value Range   WBC 20.7 (*) 4.0 - 10.5 K/uL   RBC 2.67 (*) 3.87 - 5.11 MIL/uL   Hemoglobin 7.6 (*) 12.0 - 15.0 g/dL   HCT 16.1 (*) 09.6 - 04.5 %   MCV 84.6  78.0 - 100.0 fL   MCH 28.5  26.0 - 34.0 pg   MCHC 33.6  30.0 - 36.0 g/dL   RDW 40.9  81.1 - 91.4 %   Platelets 334  150 - 400 K/uL   Neutrophils Relative % 88 (*) 43 - 77 %   Neutro Abs 18.2 (*) 1.7 - 7.7 K/uL   Lymphocytes Relative 4 (*) 12 - 46 %   Lymphs Abs 0.8  0.7 - 4.0 K/uL   Monocytes Relative 7  3 - 12 %   Monocytes Absolute 1.4 (*) 0.1 - 1.0 K/uL   Eosinophils Relative 1  0 - 5 %   Eosinophils Absolute 0.3  0.0 - 0.7 K/uL   Basophils Relative 0  0 - 1 %   Basophils Absolute 0.0  0.0 - 0.1 K/uL  COMPREHENSIVE METABOLIC PANEL     Status: Abnormal   Collection Time    03/27/13  1:33 PM      Result Value Range   Sodium 137  135 - 145 mEq/L   Potassium 3.2 (*) 3.5 - 5.1 mEq/L   Chloride 100  96 - 112 mEq/L   CO2 27  19 - 32 mEq/L   Glucose, Bld 87  70 - 99 mg/dL   BUN 8  6 - 23 mg/dL   Creatinine, Ser 7.82  0.50 - 1.10 mg/dL   Calcium 7.9 (*) 8.4 - 10.5 mg/dL   Total Protein 5.7 (*) 6.0 - 8.3 g/dL   Albumin 2.1 (*) 3.5 - 5.2 g/dL   AST 30  0 - 37 U/L   ALT 23  0 - 35 U/L   Alkaline  Phosphatase 132 (*) 39 - 117 U/L   Total Bilirubin 0.4  0.3 - 1.2 mg/dL   GFR calc non Af Amer 58 (*) >90 mL/min   GFR calc Af Amer 67 (*) >90 mL/min   Comment: (NOTE)     The eGFR has been calculated using the CKD EPI equation.     This calculation has not been validated in all clinical situations.     eGFR's persistently <90 mL/min signify possible Chronic Kidney     Disease.  URINALYSIS, ROUTINE W REFLEX MICROSCOPIC     Status: Abnormal   Collection Time    03/27/13  1:53 PM      Result Value Range   Color, Urine YELLOW  YELLOW   APPearance TURBID (*) CLEAR   Specific Gravity, Urine 1.017  1.005 - 1.030   pH 6.0  5.0 - 8.0   Glucose, UA NEGATIVE  NEGATIVE mg/dL   Hgb urine dipstick SMALL (*) NEGATIVE   Bilirubin Urine NEGATIVE  NEGATIVE   Ketones, ur 15 (*) NEGATIVE mg/dL   Protein, ur 30 (*) NEGATIVE mg/dL   Urobilinogen, UA 0.2  0.0 - 1.0 mg/dL   Nitrite NEGATIVE  NEGATIVE   Leukocytes, UA SMALL (*) NEGATIVE  URINE MICROSCOPIC-ADD ON     Status: Abnormal   Collection Time    03/27/13  1:53 PM  Result Value Range   Squamous Epithelial / LPF FEW (*) RARE   WBC, UA 3-6  <3 WBC/hpf   RBC / HPF 3-6  <3 RBC/hpf   Bacteria, UA FEW (*) RARE   Urine-Other AMORPHOUS URATES/PHOSPHATES    CULTURE, BLOOD (ROUTINE X 2)     Status: None   Collection Time    03/27/13  1:55 PM      Result Value Range   Specimen Description BLOOD PICC LINE     Special Requests BOTTLES DRAWN AEROBIC AND ANAEROBIC 10CC     Culture  Setup Time       Value: 03/27/2013 21:48     Performed at [facility]   Culture       Value:        BLOOD CULTURE RECEIVED NO GROWTH TO DATE CULTURE WILL BE HELD FOR 5 DAYS BEFORE ISSUING A FINAL NEGATIVE REPORT     Performed at [facility]   Report Status PENDING    CULTURE, BLOOD (ROUTINE X 2)     Status: None   Collection Time    03/27/13  3:25 PM      Result Value Range   Specimen Description BLOOD RIGHT HAND     Special Requests       Value:  BOTTLES DRAWN AEROBIC AND ANAEROBIC 10CC BLUE,5CC RED   Culture  Setup Time       Value: 03/27/2013 21:48     Performed at [facility]   Culture       Value:        BLOOD CULTURE RECEIVED NO GROWTH TO DATE CULTURE WILL BE HELD FOR 5 DAYS BEFORE ISSUING A FINAL NEGATIVE REPORT     Performed at [facility]   Report Status PENDING    MAGNESIUM     Status: None   Collection Time    03/27/13  3:50 PM      Result Value Range   Magnesium 1.6  1.5 - 2.5 mg/dL  PROCALCITONIN     Status: None   Collection Time    03/27/13  3:58 PM      Result Value Range   Procalcitonin 168.24     Comment:            Interpretation:     PCT >= 10 ng/mL:     Important systemic inflammatory response,     almost exclusively due to severe bacterial     sepsis or septic shock.     (NOTE)             ICU PCT Algorithm               Non ICU PCT Algorithm        ----------------------------     ------------------------------             PCT < 0.25 ng/mL                 PCT < 0.1 ng/mL         Stopping of antibiotics            Stopping of antibiotics           strongly encouraged.               strongly encouraged.        ----------------------------     ------------------------------           PCT level decrease by  PCT < 0.25 ng/mL           >= 80% from peak PCT           OR PCT 0.25 - 0.5 ng/mL          Stopping of antibiotics                                                 encouraged.         Stopping of antibiotics               encouraged.        ----------------------------     ------------------------------           PCT level decrease by              PCT >= 0.25 ng/mL           < 80% from peak PCT            AND PCT >= 0.5 ng/mL            Continuing antibiotics                                                  encouraged.           Continuing antibiotics                encouraged.        ----------------------------     ------------------------------         PCT  level increase compared          PCT > 0.5 ng/mL             with peak PCT AND              PCT >= 0.5 ng/mL             Escalation of antibiotics                                              strongly encouraged.          Escalation of antibiotics            strongly encouraged.  POCT I-STAT, CHEM 8     Status: Abnormal   Collection Time    03/27/13  3:59 PM      Result Value Range   Sodium 141  135 - 145 mEq/L   Potassium 3.0 (*) 3.5 - 5.1 mEq/L   Chloride 100  96 - 112 mEq/L   BUN 6  6 - 23 mg/dL   Creatinine, Ser 4.54 (*) 0.50 - 1.10 mg/dL   Glucose, Bld 80  70 - 99 mg/dL   Calcium, Ion 0.98  1.19 - 1.30 mmol/L   TCO2 30  0 - 100 mmol/L   Hemoglobin 7.5 (*) 12.0 - 15.0 g/dL   HCT 14.7 (*) 82.9 - 56.2 %  MRSA PCR SCREENING     Status: None   Collection Time    03/27/13  7:17 PM      Result Value Range  MRSA by PCR NEGATIVE  NEGATIVE   Comment:            The GeneXpert MRSA Assay (FDA     approved for NASAL specimens     only), is one component of a     comprehensive MRSA colonization     surveillance program. It is not     intended to diagnose MRSA     infection nor to guide or     monitor treatment for     MRSA infections.  VANCOMYCIN, TROUGH     Status: Abnormal   Collection Time    03/27/13  8:50 PM      Result Value Range   Vancomycin Tr 25.5 (*) 10.0 - 20.0 ug/mL   Comment: CRITICAL RESULT CALLED TO, READ BACK BY AND VERIFIED WITH:     PETTIFORD A,RN 03/27/13 2201 WAYK  GLUCOSE, CAPILLARY     Status: None   Collection Time    03/27/13 11:44 PM      Result Value Range   Glucose-Capillary 93  70 - 99 mg/dL  GLUCOSE, CAPILLARY     Status: None   Collection Time    03/28/13  4:29 AM      Result Value Range   Glucose-Capillary 86  70 - 99 mg/dL  BASIC METABOLIC PANEL     Status: Abnormal   Collection Time    03/28/13  5:00 AM      Result Value Range   Sodium 139  135 - 145 mEq/L   Potassium 3.4 (*) 3.5 - 5.1 mEq/L   Chloride 106  96 - 112 mEq/L   CO2 24   19 - 32 mEq/L   Glucose, Bld 110 (*) 70 - 99 mg/dL   BUN 6  6 - 23 mg/dL   Creatinine, Ser 6.21  0.50 - 1.10 mg/dL   Calcium 7.6 (*) 8.4 - 10.5 mg/dL   GFR calc non Af Amer 59 (*) >90 mL/min   GFR calc Af Amer 68 (*) >90 mL/min   Comment: (NOTE)     The eGFR has been calculated using the CKD EPI equation.     This calculation has not been validated in all clinical situations.     eGFR's persistently <90 mL/min signify possible Chronic Kidney     Disease.  CBC     Status: Abnormal   Collection Time    03/28/13  5:00 AM      Result Value Range   WBC 13.6 (*) 4.0 - 10.5 K/uL   RBC 2.29 (*) 3.87 - 5.11 MIL/uL   Hemoglobin 6.4 (*) 12.0 - 15.0 g/dL   Comment: REPEATED TO VERIFY     CRITICAL RESULT CALLED TO, READ BACK BY AND VERIFIED WITH:     A.PETTIFORD,RN [id number] 03/28/13 CLARK,S   HCT 19.2 (*) 36.0 - 46.0 %   MCV 83.8  78.0 - 100.0 fL   MCH 27.9  26.0 - 34.0 pg   MCHC 33.3  30.0 - 36.0 g/dL   RDW 30.8  65.7 - 84.6 %   Platelets 366  150 - 400 K/uL  PROCALCITONIN     Status: None   Collection Time    03/28/13  5:00 AM      Result Value Range   Procalcitonin 124.47     Comment:            Interpretation:     PCT >= 10 ng/mL:     Important systemic inflammatory response,  almost exclusively due to severe bacterial     sepsis or septic shock.     (NOTE)             ICU PCT Algorithm               Non ICU PCT Algorithm        ----------------------------     ------------------------------             PCT < 0.25 ng/mL                 PCT < 0.1 ng/mL         Stopping of antibiotics            Stopping of antibiotics           strongly encouraged.               strongly encouraged.        ----------------------------     ------------------------------           PCT level decrease by               PCT < 0.25 ng/mL           >= 80% from peak PCT           OR PCT 0.25 - 0.5 ng/mL          Stopping of antibiotics                                                 encouraged.          Stopping of antibiotics               encouraged.        ----------------------------     ------------------------------           PCT level decrease by              PCT >= 0.25 ng/mL           < 80% from peak PCT            AND PCT >= 0.5 ng/mL            Continuing antibiotics                                                  encouraged.           Continuing antibiotics                encouraged.        ----------------------------     ------------------------------         PCT level increase compared          PCT > 0.5 ng/mL             with peak PCT AND              PCT >= 0.5 ng/mL             Escalation of antibiotics  strongly encouraged.          Escalation of antibiotics            strongly encouraged.  GLUCOSE, CAPILLARY     Status: Abnormal   Collection Time    03/28/13  8:23 AM      Result Value Range   Glucose-Capillary 128 (*) 70 - 99 mg/dL   Comment 1 Notify RN    PROTIME-INR     Status: None   Collection Time    03/28/13 10:30 AM      Result Value Range   Prothrombin Time 14.6  11.6 - 15.2 seconds   INR 1.16  0.00 - 1.49  APTT     Status: Abnormal   Collection Time    03/28/13 10:30 AM      Result Value Range   aPTT 45 (*) 24 - 37 seconds   Comment:            IF BASELINE aPTT IS ELEVATED,     SUGGEST PATIENT RISK ASSESSMENT     BE USED TO DETERMINE APPROPRIATE     ANTICOAGULANT THERAPY.  PREPARE RBC (CROSSMATCH)     Status: None   Collection Time    03/28/13 10:30 AM      Result Value Range   Order Confirmation ORDER PROCESSED BY BLOOD BANK    TYPE AND SCREEN     Status: None   Collection Time    03/28/13 10:30 AM      Result Value Range   ABO/RH(D) B POS     Antibody Screen NEG     Sample Expiration 03/31/2013        Component Value Date/Time   SDES BLOOD RIGHT HAND 03/27/2013 1525   SPECREQUEST BOTTLES DRAWN AEROBIC AND ANAEROBIC 10CC BLUE,5CC RED 03/27/2013 1525   CULT  Value:        BLOOD CULTURE  RECEIVED NO GROWTH TO DATE CULTURE WILL BE HELD FOR 5 DAYS BEFORE ISSUING A FINAL NEGATIVE REPORT Performed at [facility] 03/27/2013 1525   REPTSTATUS PENDING 03/27/2013 1525   Dg Chest 2 View  03/27/2013   CLINICAL DATA:  Fever. Shortness breath. Abdominal pain. Postop or small bowel obstruction and hernia repair.  EXAM: CHEST  2 VIEW  COMPARISON:  03/21/2013  FINDINGS: Right arm PICC line is seen with tip overlying the superior cavoatrial junction.  Pulmonary emphysema again demonstrated. No evidence of pulmonary infiltrate or edema. No evidence of pleural effusion. Heart size is normal. No mass or lymphadenopathy identified. 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  IMPRESSION: New right arm PICC line in appropriate position.  Emphysema. No active disease.   Electronically Signed   By: Myles Rosenthal M.D.   On: 03/27/2013 14:33   Ct Abdomen Pelvis W Contrast  03/27/2013   CLINICAL DATA:  62 year old female with postoperative fever abdominal pain nausea and vomiting. Recent small bowel obstruction and status postoperative laparotomy for adhesions on 03/16/2013. MRSA bacteremia. Hypotension. Initial encounter.  EXAM: CT ABDOMEN AND PELVIS WITH CONTRAST  TECHNIQUE: Multidetector CT imaging of the abdomen and pelvis was performed using the standard protocol following bolus administration of intravenous contrast.  CONTRAST:  OMNIPAQUE IOHEXOL 300 MG/ML  SOLN  COMPARISON:  Abdominal radiographs 03/16/2013. Preoperative CT Abdomen and Pelvis 03/14/13.  FINDINGS: Negative lung bases. No pericardial or pleural effusion. No acute osseous abnormality identified.  Chronic changes of subtotal colectomy. Blind-ending distal colon segment, rectum and sigmoid, appears stable.  New rim enhancing fluid collection in the deep lower abdomen and pelvis encompassing 8.3 x 6.0 x 10 cm. There appears to be a small amount of mesenteric fat associated with  this collection. Oral contrast was administered, but has not significantly opacified the distal small bowel. Postoperative changes to the ventral abdomen with skin staples in place. More simple appearing fluid tracking along the subcutaneous incision site (series 2, image 38 - 57). Near to the right abdominal ostomy there is a small 17 mm rim enhancing collection suspected (image 41). Subjacent to the right ventral abdominal wall at the level of surgery there is a crescentic rim enhancing collection (image 34) encompassing 5.2 x 0.9 x 2.2 cm.  No pneumoperitoneum. Mildly dilated mid small bowel loops measuring up to 33 mm diameter. Transition seems to be in the area of the largest abscess.  Mild periportal edema in the liver. No discrete liver lesion. Gallbladder with small Phrygian cap again noted. The gallbladder fundus is located near the crescentic abscess, but not definitely involved. Portal venous system appears patent. Aortoiliac calcified atherosclerosis noted. Major arterial structures in the abdomen and pelvis are patent. Negative bladder. Possible small urethral diverticulum. Surgical clips in the epigastric region again noted. Spleen, pancreas and adrenal glands remain normal. Kidneys are stable. Right mid pole nephrolithiasis. No renal obstruction.  IMPRESSION: 1. Recent postoperative changes with a fairly large rim enhancing fluid collection in the deep lower abdomen and pelvis most compatible with abscess. See series 2, image 59. Smaller abscesses in the right upper quadrant near the ostomy (images 38, and 41). More simple appearing postoperative subcutaneous fluid along the incision site, likely seroma. No pneumoperitoneum.  2.  Mildly dilated mid small bowel loops, favor ileus.  Study discussed by telephone with hospitalist provider L. Easterwood on 03/27/2013 at 19:10 .   Electronically Signed   By: Augusto Gamble M.D.   On: 03/27/2013 19:14     Recent Results (from the past 720 hour(s))  MRSA PCR  SCREENING     Status: Abnormal   Collection Time    03/14/13  9:45 PM      Result Value Range Status   MRSA by PCR POSITIVE (*) NEGATIVE Final   Comment:            The GeneXpert MRSA Assay (FDA     approved for NASAL specimens     only), is one component of a     comprehensive MRSA colonization     surveillance program. It is not     intended to diagnose MRSA     infection nor to guide or     monitor treatment for     MRSA infections.     RESULT CALLED TO, READ BACK BY AND VERIFIED WITH:     CALLED TO RN ARIEL MUHAMMAD [id number] @0041  [name]  CULTURE, BLOOD (ROUTINE X 2)     Status: None   Collection Time    03/16/13  6:23 AM      Result Value Range Status   Specimen Description BLOOD RIGHT HAND   Final   Special Requests BOTTLES DRAWN AEROBIC AND ANAEROBIC 10CC EACH   Final   Culture  Setup Time     Final   Value: 03/16/2013 11:09     Performed at [facility]   Culture     Final   Value: NO GROWTH 5 DAYS     Performed at [facility]   Report Status 03/22/2013 FINAL   Final  CULTURE, BLOOD (ROUTINE X 2)     Status: None   Collection Time    03/16/13  6:33 AM      Result Value Range Status   Specimen Description BLOOD LEFT HAND   Final   Special Requests BOTTLES DRAWN AEROBIC ONLY 8CC   Final   Culture  Setup Time     Final   Value: 03/16/2013 11:09     Performed at [facility]   Culture     Final   Value: METHICILLIN RESISTANT STAPHYLOCOCCUS AUREUS     Note: RIFAMPIN AND GENTAMICIN SHOULD NOT BE USED AS SINGLE DRUGS FOR TREATMENT OF STAPH INFECTIONS. CRITICAL RESULT CALLED TO, READ BACK BY AND VERIFIED WITH: RACHEL LESPERANCE 03/18/13 1345 BY SMITHERSJ     Note: Gram Stain Report Called to,Read Back By and Verified With: DEANNA DILLON ON 03/18/2013 AT 12:30A BY WILEJ     Performed at [facility]   Report Status 03/19/2013 FINAL   Final   Organism ID, Bacteria METHICILLIN RESISTANT STAPHYLOCOCCUS AUREUS   Final  CULTURE, BLOOD (ROUTINE X  2)     Status: None   Collection Time    03/18/13  4:30 PM      Result Value Range Status   Specimen Description BLOOD RIGHT HAND   Final   Special Requests BOTTLES DRAWN AEROBIC ONLY 10CC   Final   Culture  Setup Time     Final   Value: 03/18/2013 22:25     Performed at [facility]   Culture     Final   Value: NO GROWTH 5 DAYS     Performed at [facility]   Report Status 03/24/2013 FINAL   Final  CULTURE, BLOOD (ROUTINE X 2)     Status: None   Collection Time    03/18/13  4:45 PM      Result Value Range Status   Specimen Description BLOOD LEFT ARM   Final   Special Requests BOTTLES DRAWN AEROBIC ONLY 10CC   Final   Culture  Setup Time     Final   Value: 03/18/2013 22:26     Performed at [facility]   Culture     Final   Value: NO GROWTH 5 DAYS     Performed at [facility]   Report Status 03/24/2013 FINAL   Final  URINE CULTURE     Status: None   Collection Time    03/21/13 10:11 AM      Result Value Range Status   Specimen Description URINE, RANDOM   Final   Special Requests NONE   Final   Culture  Setup Time     Final   Value: 03/21/2013 11:01     Performed at [facility] Count     Final   Value: NO GROWTH     Performed at [facility]   Culture     Final   Value: NO GROWTH     Performed at [facility]   Report Status 03/22/2013 FINAL   Final  CULTURE, BLOOD (ROUTINE X 2)     Status: None   Collection Time    03/27/13  1:55 PM      Result Value Range Status   Specimen Description BLOOD PICC LINE   Final   Special Requests BOTTLES DRAWN AEROBIC AND ANAEROBIC 10CC   Final   Culture  Setup Time     Final   Value:  03/27/2013 21:48     Performed at [facility]   Culture     Final   Value:        BLOOD CULTURE RECEIVED NO GROWTH TO DATE CULTURE WILL BE HELD FOR 5 DAYS BEFORE ISSUING A FINAL NEGATIVE REPORT     Performed at [facility]   Report Status PENDING   Incomplete    CULTURE, BLOOD (ROUTINE X 2)     Status: None   Collection Time    03/27/13  3:25 PM      Result Value Range Status   Specimen Description BLOOD RIGHT HAND   Final   Special Requests     Final   Value: BOTTLES DRAWN AEROBIC AND ANAEROBIC 10CC BLUE,5CC RED   Culture  Setup Time     Final   Value: 03/27/2013 21:48     Performed at [facility]   Culture     Final   Value:        BLOOD CULTURE RECEIVED NO GROWTH TO DATE CULTURE WILL BE HELD FOR 5 DAYS BEFORE ISSUING A FINAL NEGATIVE REPORT     Performed at [facility]   Report Status PENDING   Incomplete  MRSA PCR SCREENING     Status: None   Collection Time    03/27/13  7:17 PM      Result Value Range Status   MRSA by PCR NEGATIVE  NEGATIVE Final   Comment:            The GeneXpert MRSA Assay (FDA     approved for NASAL specimens     only), is one component of a     comprehensive MRSA colonization     surveillance program. It is not     intended to diagnose MRSA     infection nor to guide or     monitor treatment for     MRSA infections.     Impression/Recommendation 62 yo with Crohn's disease diabetes mellitus recently discharged from the hospital after undergoing laparotomy for adhesions on 10/22. Post op she was found to have MRSA Bacteremia in one set of blood cultures. TTE and TEE did not show any vegetations. She was discharged on IV vancomycin, now readmitted with intra-abdominal abscess currently on vancomycin and Zosyn with plans for operative drainage today.  #1 Intrabdominal abscess: --vancomycin with zosyn reasonable for now --INTRA-OPERATIVE cultures of abscess for bacteria, fungi will be helpful in guiding therapy     #2 MRSA Bacteremia: no vegetations on TEE Plan was for 2 weeks of IV vancomycin but we will reconsider duration depending on what we find with intrabdominal abscess  #3 Screening: check HIV, hep panel    Thank you so much for this interesting consult  [facility] [phone] (pager) [phone] (office) 03/28/2013, 12:32 PM  Paulette Blanch [place] 03/28/2013, 12:32 PM

## 2013-03-28 NOTE — Progress Notes (Signed)
[facility] Consult F/U Note [place] TEAM 1 - Stepdown ICU Team   [name] ZOX:[id number] DOB: [birth year] DOA: 03/27/2013 PCP: [name], MD  Brief narrative: 62 y.o. female recently discharged from the hospital after undergoing laparotomy for adhesions on 10/22. Post op she was found to have MRSA Bacteremia in one set of blood cultures. TTE and TEE did not show any vegetations. She was discharged on IV vancomycin to complete a 14 day course. She got a PICC line before discharge,. She returned to the ER w/ c/o fever chills, mild abdominal discomfort and nausea, no vomiting. On arrival to ED, she was found to be febrile , hypotensive, tachycardic. Labs revealed leukocytosis.  A CXR was negative for pneumonia and UA pending.   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 No current nausea but endorses abdominal discomfort with movement. No shortness of breath or chest pain  to  Objective: Blood pressure 102/50, pulse 108, temperature 98 F (36.7 C), temperature source Oral, resp. rate 17, height 5\' 1"  (1.549 m), weight 58.2 kg (128 lb 4.9 oz), SpO2 95.00%.  Intake/Output Summary (Last 24 hours) at 03/28/13 1312 Last data filed at 03/28/13 1200  Gross per 24 hour  Intake 1947.5 ml  Output    625 ml  Net 1322.5 ml   Exam: General: No acute respiratory distress Lungs: Clear to auscultation bilaterally without wheezes or crackles, 2L Cardiovascular: Regular rate and rhythm without murmur gallop or rub normal S1 and S2, no peripheral edema or JVD Abdomen: Somewhat tender primarily suprapubic region, nondistended, soft, bowel sounds positive, no rebound, no ascites, no appreciable mass Musculoskeletal: No significant cyanosis, clubbing of bilateral lower extremities   Scheduled Meds:  Scheduled Meds: . arformoterol  15 mcg Nebulization BID  . budesonide  0.25 mg Nebulization BID  . citalopram  20 mg Oral Daily  . enoxaparin (LOVENOX) injection  40 mg Subcutaneous Q24H  . famotidine  20 mg Oral BID  . fluticasone  2 spray Each Nare Daily  . hydroxychloroquine  200 mg Oral BID  . insulin aspart  0-9 Units Subcutaneous TID WC  . lidocaine      . piperacillin-tazobactam (ZOSYN)  IV  3.375 g Intravenous Q8H  . sodium chloride  3 mL Intravenous Q12H  . tiotropium  18 mcg Inhalation Daily  . vancomycin  500 mg Intravenous Q12H    Data Reviewed: Basic Metabolic Panel:  Recent Labs Lab 03/22/13 0435 03/27/13 1333 03/27/13 1550 03/27/13 1559 03/28/13 0500  NA 139 137  --  141 139  K 3.6 3.2*  --  3.0* 3.4*  CL 104 100  --  100 106  CO2 27 27  --   --  24  GLUCOSE 142* 87  --  80 110*  BUN 6 8  --  6 6  CREATININE 0.93 1.01  --  1.20* 1.00  CALCIUM 8.4 7.9*  --   --  7.6*  MG  --   --  1.6  --   --    Liver Function Tests:  Recent Labs Lab 03/27/13 1333  AST 30  ALT 23  ALKPHOS 132*  BILITOT 0.4  PROT 5.7*  ALBUMIN 2.1*   No results  found for this basename: LIPASE, AMYLASE,  in the last 168 hours No results found for this basename: AMMONIA,  in the last 168 hours CBC:  Recent Labs Lab 03/22/13 0435 03/27/13 1333 03/27/13 1559 03/28/13 0500  WBC 10.8* 20.7*  --  13.6*  NEUTROABS  --  18.2*  --   --   HGB 7.5* 7.6* 7.5* 6.4*  HCT 23.0* 22.6* 22.0* 19.2*  MCV 86.5 84.6  --  83.8  PLT 201 334  --  366   CBG:  Recent Labs Lab 03/22/13 1641 03/27/13 2344 03/28/13 0429 03/28/13 0823 03/28/13 1154  GLUCAP 110* 93 86 128* 93    Recent Results (from the past 240 hour(s))  CULTURE, BLOOD (ROUTINE X 2)     Status: None   Collection Time    03/18/13  4:30 PM      Result Value Range Status   Specimen Description BLOOD RIGHT HAND   Final   Special Requests BOTTLES DRAWN AEROBIC ONLY 10CC   Final   Culture  Setup Time     Final   Value: 03/18/2013 22:25     Performed at [facility]   Culture     Final   Value: NO GROWTH 5 DAYS     Performed at [facility]   Report Status 03/24/2013 FINAL   Final  CULTURE, BLOOD (ROUTINE X 2)     Status: None   Collection Time    03/18/13  4:45 PM      Result Value Range Status   Specimen Description BLOOD LEFT ARM   Final   Special Requests BOTTLES DRAWN AEROBIC ONLY 10CC   Final   Culture  Setup Time     Final   Value: 03/18/2013 22:26     Performed at [facility]   Culture     Final   Value: NO GROWTH 5 DAYS     Performed at [facility]   Report Status 03/24/2013 FINAL   Final  URINE CULTURE     Status: None   Collection Time    03/21/13 10:11 AM      Result Value Range Status   Specimen Description URINE, RANDOM   Final   Special Requests NONE   Final   Culture  Setup Time     Final   Value: 03/21/2013 11:01     Performed at [facility] Count     Final   Value: NO GROWTH     Performed at [facility]   Culture     Final   Value: NO GROWTH     Performed at [facility]   Report Status  03/22/2013 FINAL   Final  CULTURE, BLOOD (ROUTINE X 2)     Status: None  Collection Time    03/27/13  1:55 PM      Result Value Range Status   Specimen Description BLOOD PICC LINE   Final   Special Requests BOTTLES DRAWN AEROBIC AND ANAEROBIC 10CC   Final   Culture  Setup Time     Final   Value: 03/27/2013 21:48     Performed at [facility]   Culture     Final   Value:        BLOOD CULTURE RECEIVED NO GROWTH TO DATE CULTURE WILL BE HELD FOR 5 DAYS BEFORE ISSUING A FINAL NEGATIVE REPORT     Performed at [facility]   Report Status PENDING   Incomplete  CULTURE, BLOOD (ROUTINE X 2)     Status: None   Collection Time    03/27/13  3:25 PM      Result Value Range Status   Specimen Description BLOOD RIGHT HAND   Final   Special Requests     Final   Value: BOTTLES DRAWN AEROBIC AND ANAEROBIC 10CC BLUE,5CC RED   Culture  Setup Time     Final   Value: 03/27/2013 21:48     Performed at [facility]   Culture     Final   Value:        BLOOD CULTURE RECEIVED NO GROWTH TO DATE CULTURE WILL BE HELD FOR 5 DAYS BEFORE ISSUING A FINAL NEGATIVE REPORT     Performed at [facility]   Report Status PENDING   Incomplete  MRSA PCR SCREENING     Status: None   Collection Time    03/27/13  7:17 PM      Result Value Range Status   MRSA by PCR NEGATIVE  NEGATIVE Final   Comment:            The GeneXpert MRSA Assay (FDA     approved for NASAL specimens     only), is one component of a     comprehensive MRSA colonization     surveillance program. It is not     intended to diagnose MRSA     infection nor to guide or     monitor treatment for     MRSA infections.     Studies:  Recent x-ray studies have been reviewed in detail by the Attending Physician     Junious Silk, ANP [facility] Office  [phone] Pager [phone]  **If unable to reach the above provider after paging please contact the Flow Manager @ [phone]  On-Call/Text Page:       [email or web address]      password [email or web address]  If 7PM-7AM, please contact night-coverage [email or web address] Password [email or web address] 03/28/2013, 1:12 PM   LOS: 1 day   I have personally examined this patient and reviewed the entire database. I have reviewed the above note, made any necessary editorial changes, and agree with its content.  Lonia Blood, MD [facility]

## 2013-03-28 NOTE — Telephone Encounter (Signed)
Message copied by Maryan Puls on Mon Mar 28, 2013 10:07 AM ------      Message from: Rise Paganini      Created: Fri Mar 25, 2013 12:08 PM      Regarding: [place]      Contact: [phone]       Pt will need a follow-up appointment. The first available is on 05/27/13. Please call to schedule. Thank you. ------

## 2013-03-28 NOTE — H&P (Signed)
[name] is an 62 y.o. female.   Chief Complaint: Pt with hx of recent ventral hernia repair; ileostomy revision and SBO adhesion lysis. Discharged from this hospital 03/24/13.  Returned to hospital with fever and abdominal pain Wbc elevated CT reveals pelvic abscess Scheduled now for pelvic abscess drain placement HPI: HLD; osteoporosis; DM; Chrons dz; HTN; COPD; Rh arthritis  Past Medical History  Diagnosis Date  . Other diseases of vocal cords   . Osteoporosis, unspecified   . Other and unspecified hyperlipidemia   . Esophageal reflux   . Chronic airway obstruction, not elsewhere classified   . Regional enteritis of unspecified site   . 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 Type II or unspecified type diabetes mellitus without mention of complication, not stated as uncontrolled   . Crohn's disease     Ileostomy  . Pelvic adhesions   . Anxiety   . Depression   . Hypertension   . Shortness of breath   . Pneumonia   . Asthma   . Emphysema   . Hematuria   . Rheumatoid arthritis(714.0) 03/2012    [place]  . Severe cervical dysplasia age 71    cone biopsy with neg paps since    Past Surgical History  Procedure Laterality Date  . Ileostomy    . Vaginal hysterectomy  1993    [facility]  . Ear cyst excision    . Cervical cone biopsy  1978  . Colon surgery  2007    subtotal colectomy with ileostomy  . Esophagus stretch    . Oophorectomy      [facility]  . Tracheostomy  2010  . Hammer toe surgery  1980's    Bilateral Feet  . Balloon dilation  02/10/2012    Procedure: BALLOON DILATION;  Surgeon: Charolett Bumpers, MD;  Location: [facility];  Service: Endoscopy;  Laterality: N/A;  . Colposcopy    . Flexible sigmoidoscopy  03/26/2012    Procedure: FLEXIBLE SIGMOIDOSCOPY;  Surgeon: Charolett Bumpers, MD;  Location: [facility];  Service: Endoscopy;  Laterality: N/A;  . Cardiac catheterization  07/08/2007    normal  . Nm myocar perf wall motion   09/21/2006    no ischemia  . Ventral hernia repair N/A 03/16/2013    Procedure: REPAIR OF INCISIONAL AND PARASTOMAL HERNIAS;  Surgeon: Mariella Saa, MD;  Location: [facility] OR;  Service: General;  Laterality: N/A;  . Laparotomy N/A 03/16/2013    Procedure: EXPLORATORY LAPAROTOMY;  Surgeon: Mariella Saa, MD;  Location: [facility] OR;  Service: General;  Laterality: N/A;  . Lysis of adhesion N/A 03/16/2013    Procedure: LYSIS OF ADHESIONS FOR SMALL BOWEL OBSTRUCTION;  Surgeon: Mariella Saa, MD;  Location: [facility] OR;  Service: General;  Laterality: N/A;  . Insertion of mesh N/A 03/16/2013    Procedure: INSERTION OF BIOLOGIC MESH;  Surgeon: Mariella Saa, MD;  Location: [facility] OR;  Service: General;  Laterality: N/A;  . Tee without cardioversion N/A 03/21/2013    Procedure: TRANSESOPHAGEAL ECHOCARDIOGRAM (TEE);  Surgeon: Vesta Mixer, MD;  Location[facility] ENDOSCOPY;  Service: Cardiovascular;  Laterality: N/A;    Family History  Problem Relation Age of Onset  . Osteoporosis Mother   . Kidney disease Mother   . Hypertension Sister   . Stroke Sister   . Diabetes Sister    Social History:  reports that she quit smoking about 9 years ago. She has never used smokeless tobacco. She reports that she drinks alcohol. She  reports that she does not use illicit drugs.  Allergies:  Allergies  Allergen Reactions  . Esomeprazole Magnesium Other (See Comments)    NEXIUM - reaction > aggravated pt's Crohn's disease  . Other     Beans, Dander/Dust, Peas, Mushrooms  . Peanut-Containing Drug Products Other (See Comments)    unknown  . Shellfish Allergy Other (See Comments)    unknown  . Surgical Lubricant Other (See Comments)    Burns skin     Medications Prior to Admission  Medication Sig Dispense Refill  . arformoterol (BROVANA) 15 MCG/2ML NEBU Take 15 mcg by nebulization 2 (two) times daily.      [name] aspirin 81 MG tablet Take 81 mg by mouth 3 (three) times a week. Monday, Wednesday, and Friday       . budesonide (PULMICORT) 0.25 MG/2ML nebulizer solution Take 0.25 mg by nebulization 2 (two) times daily.      . celecoxib (CELEBREX) 200 MG capsule Take 200 mg by mouth daily.      . Cholecalciferol (VITAMIN D) 1000 UNITS capsule Take 1,000 Units by mouth daily.       . citalopram (CELEXA) 20 MG tablet Take 20 mg by mouth daily.       [name] diltiazem (CARDIZEM CD) 180 MG 24 hr capsule Take 180 mg by mouth 2 (two) times daily.      . famotidine (PEPCID) 20 MG tablet Take 20 mg by mouth 2 (two) times daily.      . fluticasone (FLONASE) 50 MCG/ACT nasal spray Place 2 sprays into the nose daily.      . furosemide (LASIX) 20 MG tablet Take 20 mg by mouth 2 (two) times daily. Once or twice daily as needed for swelling      . guaiFENesin (MUCINEX) 600 MG 12 hr tablet Take 1,200 mg by mouth 2 (two) times daily.       [name] HYDROcodone-acetaminophen (NORCO/VICODIN) 5-325 MG per tablet Take 2 tablets by mouth every 6 (six) hours as needed for pain.      . hydroxychloroquine (PLAQUENIL) 200 MG tablet Take 1 tablet by mouth Twice daily.      . IRON PO Take 1 tablet by mouth every Monday, Wednesday, and Friday.       . Liraglutide (VICTOZA) 18 MG/3ML SOLN Inject 1.8 mg into the skin daily. 1.8 units once daily      . LORazepam (ATIVAN) 0.5 MG tablet Take 0.5 mg by mouth every 8 (eight) hours as needed for anxiety.      . metoprolol tartrate (LOPRESSOR) 25 MG tablet Take 12.5 mg by mouth 2 (two) times daily.      . Multiple Vitamin (MULTIVITAMIN WITH MINERALS) TABS Take 1 tablet by mouth daily.      [name] omeprazole (PRILOSEC) 40 MG capsule Take 40 mg by mouth 2 (two) times daily.       . rosuvastatin (CRESTOR) 5 MG tablet Take 5 mg by mouth every Monday, Wednesday, and Friday.       . tiotropium (SPIRIVA) 18 MCG inhalation capsule Place 18 mcg into inhaler and inhale daily.      . Vancomycin (VANCOCIN) 750 MG/150ML SOLN Inject 750 mg into the vein every 12 (twelve) hours. Continue for 9 more days from 03/22/2013         Results for orders placed during the hospital encounter of 03/27/13 (from the past 48 hour(s))  LACTIC ACID, PLASMA     Status: None   Collection Time    03/27/13  1:33 PM      Result Value Range   Lactic Acid, Venous 1.3  0.5 - 2.2 mmol/L  CBC WITH DIFFERENTIAL     Status: Abnormal   Collection Time    03/27/13  1:33 PM      Result Value Range   WBC 20.7 (*) 4.0 - 10.5 K/uL   RBC 2.67 (*) 3.87 - 5.11 MIL/uL   Hemoglobin 7.6 (*) 12.0 - 15.0 g/dL   HCT 45.4 (*) 09.8 - 11.9 %   MCV 84.6  78.0 - 100.0 fL   MCH 28.5  26.0 - 34.0 pg   MCHC 33.6  30.0 - 36.0 g/dL   RDW 14.7  82.9 - 56.2 %   Platelets 334  150 - 400 K/uL   Neutrophils Relative % 88 (*) 43 - 77 %   Neutro Abs 18.2 (*) 1.7 - 7.7 K/uL   Lymphocytes Relative 4 (*) 12 - 46 %   Lymphs Abs 0.8  0.7 - 4.0 K/uL   Monocytes Relative 7  3 - 12 %   Monocytes Absolute 1.4 (*) 0.1 - 1.0 K/uL   Eosinophils Relative 1  0 - 5 %   Eosinophils Absolute 0.3  0.0 - 0.7 K/uL   Basophils Relative 0  0 - 1 %   Basophils Absolute 0.0  0.0 - 0.1 K/uL  COMPREHENSIVE METABOLIC PANEL     Status: Abnormal   Collection Time    03/27/13  1:33 PM      Result Value Range   Sodium 137  135 - 145 mEq/L   Potassium 3.2 (*) 3.5 - 5.1 mEq/L   Chloride 100  96 - 112 mEq/L   CO2 27  19 - 32 mEq/L   Glucose, Bld 87  70 - 99 mg/dL   BUN 8  6 - 23 mg/dL   Creatinine, Ser 1.30  0.50 - 1.10 mg/dL   Calcium 7.9 (*) 8.4 - 10.5 mg/dL   Total Protein 5.7 (*) 6.0 - 8.3 g/dL   Albumin 2.1 (*) 3.5 - 5.2 g/dL   AST 30  0 - 37 U/L   ALT 23  0 - 35 U/L   Alkaline Phosphatase 132 (*) 39 - 117 U/L   Total Bilirubin 0.4  0.3 - 1.2 mg/dL   GFR calc non Af Amer 58 (*) >90 mL/min   GFR calc Af Amer 67 (*) >90 mL/min   Comment: (NOTE)     The eGFR has been calculated using the CKD EPI equation.     This calculation has not been validated in all clinical situations.     eGFR's persistently <90 mL/min signify possible Chronic Kidney     Disease.  URINALYSIS,  ROUTINE W REFLEX MICROSCOPIC     Status: Abnormal   Collection Time    03/27/13  1:53 PM      Result Value Range   Color, Urine YELLOW  YELLOW   APPearance TURBID (*) CLEAR   Specific Gravity, Urine 1.017  1.005 - 1.030   pH 6.0  5.0 - 8.0   Glucose, UA NEGATIVE  NEGATIVE mg/dL   Hgb urine dipstick SMALL (*) NEGATIVE   Bilirubin Urine NEGATIVE  NEGATIVE   Ketones, ur 15 (*) NEGATIVE mg/dL   Protein, ur 30 (*) NEGATIVE mg/dL   Urobilinogen, UA 0.2  0.0 - 1.0 mg/dL   Nitrite NEGATIVE  NEGATIVE   Leukocytes, UA SMALL (*) NEGATIVE  URINE MICROSCOPIC-ADD ON     Status: Abnormal   Collection  Time    03/27/13  1:53 PM      Result Value Range   Squamous Epithelial / LPF FEW (*) RARE   WBC, UA 3-6  <3 WBC/hpf   RBC / HPF 3-6  <3 RBC/hpf   Bacteria, UA FEW (*) RARE   Urine-Other AMORPHOUS URATES/PHOSPHATES    CULTURE, BLOOD (ROUTINE X 2)     Status: None   Collection Time    03/27/13  1:55 PM      Result Value Range   Specimen Description BLOOD PICC LINE     Special Requests BOTTLES DRAWN AEROBIC AND ANAEROBIC 10CC     Culture  Setup Time       Value: 03/27/2013 21:48     Performed at [facility]   Culture       Value:        BLOOD CULTURE RECEIVED NO GROWTH TO DATE CULTURE WILL BE HELD FOR 5 DAYS BEFORE ISSUING A FINAL NEGATIVE REPORT     Performed at [facility]   Report Status PENDING    CULTURE, BLOOD (ROUTINE X 2)     Status: None   Collection Time    03/27/13  3:25 PM      Result Value Range   Specimen Description BLOOD RIGHT HAND     Special Requests       Value: BOTTLES DRAWN AEROBIC AND ANAEROBIC 10CC BLUE,5CC RED   Culture  Setup Time       Value: 03/27/2013 21:48     Performed at [facility]   Culture       Value:        BLOOD CULTURE RECEIVED NO GROWTH TO DATE CULTURE WILL BE HELD FOR 5 DAYS BEFORE ISSUING A FINAL NEGATIVE REPORT     Performed at [facility]   Report Status PENDING    MAGNESIUM     Status: None   Collection  Time    03/27/13  3:50 PM      Result Value Range   Magnesium 1.6  1.5 - 2.5 mg/dL  PROCALCITONIN     Status: None   Collection Time    03/27/13  3:58 PM      Result Value Range   Procalcitonin 168.24     Comment:            Interpretation:     PCT >= 10 ng/mL:     Important systemic inflammatory response,     almost exclusively due to severe bacterial     sepsis or septic shock.     (NOTE)             ICU PCT Algorithm               Non ICU PCT Algorithm        ----------------------------     ------------------------------             PCT < 0.25 ng/mL                 PCT < 0.1 ng/mL         Stopping of antibiotics            Stopping of antibiotics           strongly encouraged.               strongly encouraged.        ----------------------------     ------------------------------  PCT level decrease by               PCT < 0.25 ng/mL           >= 80% from peak PCT           OR PCT 0.25 - 0.5 ng/mL          Stopping of antibiotics                                                 encouraged.         Stopping of antibiotics               encouraged.        ----------------------------     ------------------------------           PCT level decrease by              PCT >= 0.25 ng/mL           < 80% from peak PCT            AND PCT >= 0.5 ng/mL            Continuing antibiotics                                                  encouraged.           Continuing antibiotics                encouraged.        ----------------------------     ------------------------------         PCT level increase compared          PCT > 0.5 ng/mL             with peak PCT AND              PCT >= 0.5 ng/mL             Escalation of antibiotics                                              strongly encouraged.          Escalation of antibiotics            strongly encouraged.  POCT I-STAT, CHEM 8     Status: Abnormal   Collection Time    03/27/13  3:59 PM      Result Value Range   Sodium  141  135 - 145 mEq/L   Potassium 3.0 (*) 3.5 - 5.1 mEq/L   Chloride 100  96 - 112 mEq/L   BUN 6  6 - 23 mg/dL   Creatinine, Ser 9.62 (*) 0.50 - 1.10 mg/dL   Glucose, Bld 80  70 - 99 mg/dL   Calcium, Ion 9.52  8.41 - 1.30 mmol/L   TCO2 30  0 - 100 mmol/L   Hemoglobin 7.5 (*) 12.0 - 15.0 g/dL   HCT 32.4 (*) 40.1 - 02.7 %  MRSA PCR SCREENING     Status: None  Collection Time    03/27/13  7:17 PM      Result Value Range   MRSA by PCR NEGATIVE  NEGATIVE   Comment:            The GeneXpert MRSA Assay (FDA     approved for NASAL specimens     only), is one component of a     comprehensive MRSA colonization     surveillance program. It is not     intended to diagnose MRSA     infection nor to guide or     monitor treatment for     MRSA infections.  VANCOMYCIN, TROUGH     Status: Abnormal   Collection Time    03/27/13  8:50 PM      Result Value Range   Vancomycin Tr 25.5 (*) 10.0 - 20.0 ug/mL   Comment: CRITICAL RESULT CALLED TO, READ BACK BY AND VERIFIED WITH:     PETTIFORD A,RN 03/27/13 2201 WAYK  GLUCOSE, CAPILLARY     Status: None   Collection Time    03/27/13 11:44 PM      Result Value Range   Glucose-Capillary 93  70 - 99 mg/dL  GLUCOSE, CAPILLARY     Status: None   Collection Time    03/28/13  4:29 AM      Result Value Range   Glucose-Capillary 86  70 - 99 mg/dL  BASIC METABOLIC PANEL     Status: Abnormal   Collection Time    03/28/13  5:00 AM      Result Value Range   Sodium 139  135 - 145 mEq/L   Potassium 3.4 (*) 3.5 - 5.1 mEq/L   Chloride 106  96 - 112 mEq/L   CO2 24  19 - 32 mEq/L   Glucose, Bld 110 (*) 70 - 99 mg/dL   BUN 6  6 - 23 mg/dL   Creatinine, Ser 4.54  0.50 - 1.10 mg/dL   Calcium 7.6 (*) 8.4 - 10.5 mg/dL   GFR calc non Af Amer 59 (*) >90 mL/min   GFR calc Af Amer 68 (*) >90 mL/min   Comment: (NOTE)     The eGFR has been calculated using the CKD EPI equation.     This calculation has not been validated in all clinical situations.     eGFR's  persistently <90 mL/min signify possible Chronic Kidney     Disease.  CBC     Status: Abnormal   Collection Time    03/28/13  5:00 AM      Result Value Range   WBC 13.6 (*) 4.0 - 10.5 K/uL   RBC 2.29 (*) 3.87 - 5.11 MIL/uL   Hemoglobin 6.4 (*) 12.0 - 15.0 g/dL   Comment: REPEATED TO VERIFY     CRITICAL RESULT CALLED TO, READ BACK BY AND VERIFIED WITH:     A.PETTIFORD,RN 0732 03/28/13 CLARK,S   HCT 19.2 (*) 36.0 - 46.0 %   MCV 83.8  78.0 - 100.0 fL   MCH 27.9  26.0 - 34.0 pg   MCHC 33.3  30.0 - 36.0 g/dL   RDW 09.8  11.9 - 14.7 %   Platelets 366  150 - 400 K/uL  GLUCOSE, CAPILLARY     Status: Abnormal   Collection Time    03/28/13  8:23 AM      Result Value Range   Glucose-Capillary 128 (*) 70 - 99 mg/dL   Comment 1 Notify RN     Dg Chest  2 View  03/27/2013   CLINICAL DATA:  Fever. Shortness breath. Abdominal pain. Postop or small bowel obstruction and hernia repair.  EXAM: CHEST  2 VIEW  COMPARISON:  03/21/2013  FINDINGS: Right arm PICC line is seen with tip overlying the superior cavoatrial junction.  Pulmonary emphysema again demonstrated. No evidence of pulmonary infiltrate or edema. No evidence of pleural effusion. Heart size is normal. No mass or lymphadenopathy identified. 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  IMPRESSION: New right arm PICC line in appropriate position.  Emphysema. No active disease.   Electronically Signed   By: Myles Rosenthal M.D.   On: 03/27/2013 14:33   Ct Abdomen Pelvis W Contrast  03/27/2013   CLINICAL DATA:  62 year old female with postoperative fever abdominal pain nausea and vomiting. Recent small bowel obstruction and status postoperative laparotomy for adhesions on 03/16/2013. MRSA bacteremia. Hypotension. Initial encounter.  EXAM: CT ABDOMEN AND PELVIS WITH CONTRAST  TECHNIQUE: Multidetector CT imaging of the abdomen and pelvis was performed using the standard protocol following bolus  administration of intravenous contrast.  CONTRAST:  OMNIPAQUE IOHEXOL 300 MG/ML  SOLN  COMPARISON:  Abdominal radiographs 03/16/2013. Preoperative CT Abdomen and Pelvis 03/14/13.  FINDINGS: Negative lung bases. No pericardial or pleural effusion. No acute osseous abnormality identified.  Chronic changes of subtotal colectomy. Blind-ending distal colon segment, rectum and sigmoid, appears stable.  New rim enhancing fluid collection in the deep lower abdomen and pelvis encompassing 8.3 x 6.0 x 10 cm. There appears to be a small amount of mesenteric fat associated with this collection. Oral contrast was administered, but has not significantly opacified the distal small bowel. Postoperative changes to the ventral abdomen with skin staples in place. More simple appearing fluid tracking along the subcutaneous incision site (series 2, image 38 - 57). Near to the right abdominal ostomy there is a small 17 mm rim enhancing collection suspected (image 41). Subjacent to the right ventral abdominal wall at the level of surgery there is a crescentic rim enhancing collection (image 34) encompassing 5.2 x 0.9 x 2.2 cm.  No pneumoperitoneum. Mildly dilated mid small bowel loops measuring up to 33 mm diameter. Transition seems to be in the area of the largest abscess.  Mild periportal edema in the liver. No discrete liver lesion. Gallbladder with small Phrygian cap again noted. The gallbladder fundus is located near the crescentic abscess, but not definitely involved. Portal venous system appears patent. Aortoiliac calcified atherosclerosis noted. Major arterial structures in the abdomen and pelvis are patent. Negative bladder. Possible small urethral diverticulum. Surgical clips in the epigastric region again noted. Spleen, pancreas and adrenal glands remain normal. Kidneys are stable. Right mid pole nephrolithiasis. No renal obstruction.  IMPRESSION: 1. Recent postoperative changes with a fairly large rim enhancing fluid  collection in the deep lower abdomen and pelvis most compatible with abscess. See series 2, image 59. Smaller abscesses in the right upper quadrant near the ostomy (images 38, and 41). More simple appearing postoperative subcutaneous fluid along the incision site, likely seroma. No pneumoperitoneum.  2.  Mildly dilated mid small bowel loops, favor ileus.  Study discussed by telephone with hospitalist provider L. Easterwood on 03/27/2013 at 19:10 .   Electronically Signed   By: Augusto Gamble M.D.   On: 03/27/2013 19:14    Review of Systems  Constitutional: Positive for weight loss. Negative for fever.  Respiratory: Negative for sputum production.   Cardiovascular: Negative for chest pain.  Gastrointestinal: Positive  for nausea and abdominal pain.  Neurological: Positive for weakness.    Blood pressure 103/46, pulse 115, temperature 98 F (36.7 C), temperature source Oral, resp. rate 36, height 5\' 1"  (1.549 m), weight 128 lb 4.9 oz (58.2 kg), SpO2 94.00%. Physical Exam  Constitutional: She is oriented to person, place, and time.  Cardiovascular: Normal rate, regular rhythm and normal heart sounds.   No murmur heard. Respiratory: Effort normal and breath sounds normal. She has no wheezes.  GI: Soft. Bowel sounds are normal. There is tenderness.  Musculoskeletal: Normal range of motion.  Neurological: She is alert and oriented to person, place, and time.  Skin: Skin is warm and dry.  Psychiatric: She has a normal mood and affect. Her behavior is normal. Judgment and thought content normal.     Assessment/Plan Recent surgery: ventral hernia repair; ileostomy revision; SBO adhesion lysis dcd from [facility] 10/30 Returned 11/2 with abd pain; fever; elevated wbc CT reveals pelvic abscess Scheduled for abscess drain Pt aware of procedure benefits and risks and agreeable to proceed Consent signed and in chart  Saheed Carrington A 03/28/2013, 8:50 AM

## 2013-03-28 NOTE — Telephone Encounter (Signed)
Noted.  Let her know  I will try to round on her Wednesday on my return.  What room is she in?

## 2013-03-28 NOTE — Telephone Encounter (Signed)
I spoke with pt sister. She reports pt was re-admitted to the hospital yesterday. This is FYI for [place]. Will forward to Dr. [name] so he is aware.

## 2013-03-28 NOTE — Progress Notes (Signed)
INITIAL NUTRITION ASSESSMENT  DOCUMENTATION CODES Per approved criteria  -Not Applicable   INTERVENTION: Advance diet as medically appropriate  RD to follow for nutrition care plan, add interventions accordingly  NUTRITION DIAGNOSIS: Inadequate oral intake related to inability to eat as evidenced by NPO status  Goal: Pt to meet >/= 90% of their estimated nutrition needs   Monitor:  PO diet advancement & intake, weight, labs, I/O's   Reason for Assessment: Malnutrition Screening Tool Report  62 y.o. female  Admitting Dx: fever and abdominal pain  ASSESSMENT: Patient with PMH DM, HTN, anxiety & depression and PNA recently discharged from the hospital after undergoing laparotomy for adhesions on 10/22; presented back to [facility] with fever, chills, mild abdominal discomfort and nausea.   RD unable to interview patient at this time -- patient in CT IMAGING -- per admission nutrition screen patient reported eating poorly because of a decreased appetite; per weight readings, patient has had a 7% weight loss from February 2014 to mid-October 2014 -- not significant for time frame.  Height: Ht Readings from Last 1 Encounters:  03/27/13 5\' 1"  (1.549 m)    Weight: Wt Readings from Last 1 Encounters:  03/27/13 128 lb 4.9 oz (58.2 kg)    Ideal Body Weight: 105 lb  % Ideal Body Weight: 121%  Wt Readings from Last 10 Encounters:  03/27/13 128 lb 4.9 oz (58.2 kg)  03/16/13 144 lb 10 oz (65.6 kg)  03/16/13 144 lb 10 oz (65.6 kg)  03/16/13 144 lb 10 oz (65.6 kg)  03/09/13 119 lb (53.978 kg)  02/09/13 117 lb 9.6 oz (53.343 kg)  01/19/13 121 lb 9.6 oz (55.157 kg)  10/08/12 123 lb (55.792 kg)  09/03/12 128 lb (58.06 kg)  07/07/12 128 lb (58.06 kg)    Usual Body Weight: 119 lb -- 03/09/2013  % Usual Body Weight: 111%  BMI:  Body mass index is 24.26 kg/(m^2).  Estimated Nutritional Needs: Kcal: 1500-1700 Protein: 70-80 gm Fluid: 1.5-1.7 L  Skin: Intact  Diet Order:  NPO  EDUCATION NEEDS: -No education needs identified at this time   Intake/Output Summary (Last 24 hours) at 03/28/13 1345 Last data filed at 03/28/13 1200  Gross per 24 hour  Intake 1947.5 ml  Output    625 ml  Net 1322.5 ml    Labs:   Recent Labs Lab 03/22/13 0435 03/27/13 1333 03/27/13 1550 03/27/13 1559 03/28/13 0500  NA 139 137  --  141 139  K 3.6 3.2*  --  3.0* 3.4*  CL 104 100  --  100 106  CO2 27 27  --   --  24  BUN 6 8  --  6 6  CREATININE 0.93 1.01  --  1.20* 1.00  CALCIUM 8.4 7.9*  --   --  7.6*  MG  --   --  1.6  --   --   GLUCOSE 142* 87  --  80 110*    CBG (last 3)   Recent Labs  03/28/13 0429 03/28/13 0823 03/28/13 1154  GLUCAP 86 128* 93    Scheduled Meds: . arformoterol  15 mcg Nebulization BID  . budesonide  0.25 mg Nebulization BID  . citalopram  20 mg Oral Daily  . enoxaparin (LOVENOX) injection  40 mg Subcutaneous Q24H  . famotidine  20 mg Oral BID  . fluticasone  2 spray Each Nare Daily  . hydroxychloroquine  200 mg Oral BID  . insulin aspart  0-9 Units Subcutaneous TID WC  .  lidocaine      . piperacillin-tazobactam (ZOSYN)  IV  3.375 g Intravenous Q8H  . sodium chloride  3 mL Intravenous Q12H  . tiotropium  18 mcg Inhalation Daily  . vancomycin  500 mg Intravenous Q12H    Continuous Infusions: . sodium chloride 75 mL/hr at 03/27/13 1930    Past Medical History  Diagnosis Date  . Other diseases of vocal cords   . Osteoporosis, unspecified   . Other and unspecified hyperlipidemia   . Esophageal reflux   . Chronic airway obstruction, not elsewhere classified   . Regional enteritis of unspecified site   . 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 Type II or unspecified type diabetes mellitus without mention of complication, not stated as uncontrolled   . Crohn's disease     Ileostomy  . Pelvic adhesions   . Anxiety   . Depression   . Hypertension   . Shortness of breath   . Pneumonia   . Asthma   .  Emphysema   . Hematuria   . Rheumatoid arthritis(714.0) 03/2012    [place]  . Severe cervical dysplasia age 17    cone biopsy with neg paps since    Past Surgical History  Procedure Laterality Date  . Ileostomy    . Vaginal hysterectomy  1993    [facility]-LSO  . Ear cyst excision    . Cervical cone biopsy  1978  . Colon surgery  2007    subtotal colectomy with ileostomy  . Esophagus stretch    . Oophorectomy      LSO  . Tracheostomy  2010  . Hammer toe surgery  1980's    Bilateral Feet  . Balloon dilation  02/10/2012    Procedure: BALLOON DILATION;  Surgeon: Charolett Bumpers, MD;  Location: [facility] ENDOSCOPY;  Service: Endoscopy;  Laterality: N/A;  . Colposcopy    . Flexible sigmoidoscopy  03/26/2012    Procedure: FLEXIBLE SIGMOIDOSCOPY;  Surgeon: Charolett Bumpers, MD;  Location: [facility] ENDOSCOPY;  Service: Endoscopy;  Laterality: N/A;  . Cardiac catheterization  07/08/2007    normal  . Nm myocar perf wall motion  09/21/2006    no ischemia  . Ventral hernia repair N/A 03/16/2013    Procedure: REPAIR OF INCISIONAL AND PARASTOMAL HERNIAS;  Surgeon: Mariella Saa, MD;  Location: [facility] OR;  Service: General;  Laterality: N/A;  . Laparotomy N/A 03/16/2013    Procedure: EXPLORATORY LAPAROTOMY;  Surgeon: Mariella Saa, MD;  Location: [facility] OR;  Service: General;  Laterality: N/A;  . Lysis of adhesion N/A 03/16/2013    Procedure: LYSIS OF ADHESIONS FOR SMALL BOWEL OBSTRUCTION;  Surgeon: Mariella Saa, MD;  Location: [facility] OR;  Service: General;  Laterality: N/A;  . Insertion of mesh N/A 03/16/2013    Procedure: INSERTION OF BIOLOGIC MESH;  Surgeon: Mariella Saa, MD;  Location: [facility] OR;  Service: General;  Laterality: N/A;  . Tee without cardioversion N/A 03/21/2013    Procedure: TRANSESOPHAGEAL ECHOCARDIOGRAM (TEE);  Surgeon: Vesta Mixer, MD;  Location: [facility] ENDOSCOPY;  Service: Cardiovascular;  Laterality: N/A;    Maureen Chatters, RD, LDN Pager #: [phone] After-Hours Pager #:  [phone]

## 2013-03-28 NOTE — Progress Notes (Signed)
PT Cancellation Note  Patient Details Name: JAMELYN BOVARD MRN: [id number] DOB: 01-13-1951   Cancelled Treatment:    Reason Eval Not Completed: Patient not medically ready. Noted order is to begin 03/29/13 and pt with Hgb 6.4 and awaiting drainage of pelvic abscesses. Will attempt eval 11/04 if medically appropriate.   Mishka Stegemann 03/28/2013, 8:36 AM Pager [phone]

## 2013-03-28 NOTE — Telephone Encounter (Signed)
Pt is in room C11 on 2nd floor. She wanted PW to know they are planning on surgery to remove some scar tissue either today or tomorrow.

## 2013-03-28 NOTE — Procedures (Signed)
Successful RT TRANSGLUTEAL PELVIC ABSCESS DRAIN NO COMP  STABLE FULL REPORT IN PACS 155 CC PUS ASPIRATED GS/CX SENT

## 2013-03-28 NOTE — Progress Notes (Signed)
In to start blood transfusion.Marland Kitchentemp 99.9. Dr. Sharon Seller  Paged.

## 2013-03-29 ENCOUNTER — Inpatient Hospital Stay: Payer: Medicare Other | Admitting: Internal Medicine

## 2013-03-29 DIAGNOSIS — A419 Sepsis, unspecified organism: Secondary | ICD-10-CM

## 2013-03-29 DIAGNOSIS — I10 Essential (primary) hypertension: Secondary | ICD-10-CM

## 2013-03-29 DIAGNOSIS — E119 Type 2 diabetes mellitus without complications: Secondary | ICD-10-CM

## 2013-03-29 DIAGNOSIS — N731 Chronic parametritis and pelvic cellulitis: Secondary | ICD-10-CM

## 2013-03-29 DIAGNOSIS — D62 Acute posthemorrhagic anemia: Secondary | ICD-10-CM

## 2013-03-29 LAB — TYPE AND SCREEN
ABO/RH(D): B POS
Antibody Screen: NEGATIVE
PT AG Type: NEGATIVE
Unit division: 0

## 2013-03-29 LAB — GLUCOSE, CAPILLARY
Glucose-Capillary: 109 mg/dL — ABNORMAL HIGH (ref 70–99)
Glucose-Capillary: 93 mg/dL (ref 70–99)

## 2013-03-29 LAB — CBC
Hemoglobin: 9.2 g/dL — ABNORMAL LOW (ref 12.0–15.0)
MCH: 28.4 pg (ref 26.0–34.0)
Platelets: 324 10*3/uL (ref 150–400)
RBC: 3.24 MIL/uL — ABNORMAL LOW (ref 3.87–5.11)
WBC: 14.7 10*3/uL — ABNORMAL HIGH (ref 4.0–10.5)

## 2013-03-29 LAB — HEPATITIS PANEL, ACUTE
HCV Ab: NEGATIVE
Hep A IgM: NONREACTIVE
Hep B C IgM: NONREACTIVE

## 2013-03-29 LAB — BASIC METABOLIC PANEL
CO2: 23 mEq/L (ref 19–32)
Calcium: 7.4 mg/dL — ABNORMAL LOW (ref 8.4–10.5)
GFR calc Af Amer: 66 mL/min — ABNORMAL LOW (ref >90)
GFR calc non Af Amer: 57 mL/min — ABNORMAL LOW (ref >90)
Glucose, Bld: 80 mg/dL (ref 70–99)
Potassium: 3.9 mEq/L (ref 3.5–5.1)
Sodium: 142 mEq/L (ref 135–145)

## 2013-03-29 MED ORDER — DILTIAZEM HCL ER COATED BEADS 120 MG PO CP24
120.0000 mg | ORAL_CAPSULE | Freq: Every day | ORAL | Status: DC
  Administered 2013-03-29 – 2013-03-30 (×2): 120 mg via ORAL
  Filled 2013-03-29 (×2): qty 1

## 2013-03-29 NOTE — Clinical Social Work Psychosocial (Signed)
Clinical Social Work Department BRIEF PSYCHOSOCIAL ASSESSMENT 03/29/2013  Patient:  [name], [name]     Account Number:  [id number]     Admit date:  03/27/2013  Clinical Social Worker:  Varney Biles  Date/Time:  03/29/2013 03:22 PM  Referred by:  Physician  Date Referred:  03/29/2013 Referred for  SNF Placement   Other Referral:   Interview type:  Patient Other interview type:    PSYCHOSOCIAL DATA Living Status:  ALONE Admitted from facility:   Level of care:   Primary support name:  Lars Masson ([phone]) Primary support relationship to patient:  SIBLING Degree of support available:   Good--pt has a boyfriend Andreas Newport), sister Lars Masson), and cousin Joretta Bachelor) who provide support.    CURRENT CONCERNS Current Concerns  Post-Acute Placement   Other Concerns:    SOCIAL WORK ASSESSMENT / PLAN CSW introduced herself and explained that PT is recommending pt go to SNF for short-term stay to get rehab before returning back to her apartment. Pt was living in her apartment alone before hospital admission. Pt agreeable to CSW sending clinicals to all facilities in [facility]. CSW provided pt with a list of all facilities, and pt will review this list when her sister comes to the hospital.   Assessment/plan status:  Psychosocial Support/Ongoing Assessment of Needs Other assessment/ plan:   Information/referral to community resources:   SNF    PATIENT'S/FAMILY'S RESPONSE TO PLAN OF CARE: Pt receptive to CSW visit but was visibly tired when CSW completed assessment, providing short answers to questions and closing her eyes intermittently during conversation.       Maryclare Labrador, MSW, [facility] Clinical Social Worker [phone]

## 2013-03-29 NOTE — Clinical Social Work Placement (Addendum)
Clinical Social Work Department CLINICAL SOCIAL WORK PLACEMENT NOTE 03/29/2013  Patient:  [name], [name]  Account Number:  [id number] Admit date:  03/27/2013  Clinical Social Worker:  Maryclare Labrador, Theresia Majors  Date/time:  03/29/2013 03:27 PM  Clinical Social Work is seeking post-discharge placement for this patient at the following level of care:   SKILLED NURSING   (*CSW will update this form in Epic as items are completed)   03/29/2013  Patient/family provided with [facility] Department of Clinical Social Work's list of facilities offering this level of care within the geographic area requested by the patient (or if unable, by the patient's family).  03/29/2013  Patient/family informed of their freedom to choose among providers that offer the needed level of care, that participate in Medicare, Medicaid or managed care program needed by the patient, have an available bed and are willing to accept the patient.  03/29/2013  Patient/family informed of [facility]' ownership interest in [facility], as well as of the fact that they are under no obligation to receive care at this facility.  PASARR submitted to EDS on 03/29/2013 PASARR number received from EDS on 03/29/2013  FL2 transmitted to all facilities in geographic area requested by pt/family on  03/29/2013 FL2 transmitted to all facilities within larger geographic area on   Patient informed that his/her managed care company has contracts with or will negotiate with  certain facilities, including the following:     Patient/family informed of bed offers received:  03/30/2013 Patient chooses bed at [facility] Physician recommends and patient chooses bed at    Patient to be transferred to [facility] on   Patient to be transferred to facility by   The following physician request were entered in Epic:   Additional Comments:   Maryclare Labrador, MSW, [facility] Clinical Social Worker [phone]

## 2013-03-29 NOTE — Progress Notes (Signed)
I have seen and examined the patient and agree with the assessment and plans. If WBC remains elevated, will have to open up her midline wound.  Ettie Krontz A. Magnus Ivan  MD, FACS

## 2013-03-29 NOTE — Progress Notes (Signed)
Physical Therapy Evaluation Patient Details Name: [name] MRN: [id number] DOB: 1950/10/06 Today's Date: 03/29/2013 Time: 4098-1191 PT Time Calculation (min): 25 min  PT Assessment / Plan / Recommendation History of Present Illness  Patient is a 62 year old female with a history of Crohn's colitis, status post total abdominal colectomy and end ileostomy at [facility] in 2007.  She recently presented with acute small bowel obstruction. Pt underwent REPAIR OF INCISIONAL AND PARASTOMAL HERNIAS, LYSIS OF ADHESIONS FOR SMALL BOWEL OBSTRUCTION and INSERTION OF BIOLOGIC MESH. Pt presents this admit for   Clinical Impression  Pt admitted with above. Pt currently with functional limitations due to the deficits listed below (see PT Problem List). Pt will need short term NH stay with therapy prior to d/c home.   Pt will benefit from skilled PT to increase their independence and safety with mobility to allow discharge to the venue listed below.     PT Assessment  Patient needs continued PT services    Follow Up Recommendations  SNF;Supervision/Assistance - 24 hour       Barriers to Discharge Decreased caregiver support      Equipment Recommendations  None recommended by PT         Frequency Min 3X/week    Precautions / Restrictions Precautions Precautions: None Restrictions Weight Bearing Restrictions: No   Pertinent Vitals/Pain VSS, some pain in back per pt      Mobility  Bed Mobility Bed Mobility: Rolling Left;Left Sidelying to Sit;Sitting - Scoot to [place] of Bed;Sit to Supine Rolling Left: 4: Min assist;With rail Left Sidelying to Sit: 4: Min assist;With rails;HOB elevated Sitting - Scoot to [place] of Bed: 4: Min assist Sit to Supine: 4: Min assist;With rail;HOB flat Sit to Sidelying Left: Not Tested (comment) Details for Bed Mobility Assistance: Verbal cues for technique and incr time.Had to assist moving LEs back into bed.  Transfers Transfers: Sit to Stand;Stand to Sit Sit  to Stand: 4: Min assist;With upper extremity assist;With armrests;From chair/3-in-1 Stand to Sit: 4: Min guard;With upper extremity assist;With armrests;To chair/3-in-1 Details for Transfer Assistance: Patient used correct technique, scooting to edge of chair and with proper hand placement.  Assist to rise to standing and for balance initially. Ambulation/Gait Ambulation/Gait Assistance: 4: Min assist Ambulation Distance (Feet): 5 Feet Assistive device: Rolling walker Ambulation/Gait Assistance Details: Pt only able to ambulate 5 feet forward and backward due to pain and weakness.   Gait Pattern: Step-through pattern;Decreased stride length;Trunk flexed Gait velocity: very slow Stairs: No Wheelchair Mobility Wheelchair Mobility: No         PT Diagnosis: Generalized weakness  PT Problem List: Decreased mobility;Decreased activity tolerance;Pain PT Treatment Interventions: DME instruction;Gait training;Functional mobility training;Therapeutic activities;Patient/family education     PT Goals(Current goals can be found in the care plan section) Acute Rehab PT Goals Patient Stated Goal: go home after a short rehab stay PT Goal Formulation: With patient Time For Goal Achievement: 04/12/13 Potential to Achieve Goals: Good  Visit Information  Last PT Received On: 03/29/13 Assistance Needed: +1 History of Present Illness: Patient is a 62 year old female with a history of Crohn's colitis, status post total abdominal colectomy and end ileostomy at [facility] in 2007.  She recently presented with acute small bowel obstruction. Pt underwent REPAIR OF INCISIONAL AND PARASTOMAL HERNIAS, LYSIS OF ADHESIONS FOR SMALL BOWEL OBSTRUCTION and INSERTION OF BIOLOGIC MESH. Pt presents this admit for        Prior Functioning  Home Living Family/patient expects to be discharged to:: Private residence Living  Arrangements: Alone Available Help at Discharge: Family;Available PRN/intermittently (sister from  [place]) Type of Home: Apartment Home Access: Level entry Home Layout: One level Home Equipment: Walker - 2 wheels;Cane - single point;Bedside commode;Wheelchair - manual;Shower seat Prior Function Level of Independence: Independent Communication Communication: No difficulties Dominant Hand: Right    Cognition  Cognition Arousal/Alertness: Awake/alert Behavior During Therapy: WFL for tasks assessed/performed Overall Cognitive Status: Within Functional Limits for tasks assessed    Extremity/Trunk Assessment Upper Extremity Assessment Upper Extremity Assessment: Defer to OT evaluation Lower Extremity Assessment Lower Extremity Assessment: Generalized weakness Cervical / Trunk Assessment Cervical / Trunk Assessment: Normal   Balance Static Standing Balance Static Standing - Balance Support: Bilateral upper extremity supported Static Standing - Level of Assistance: 4: Min assist  End of Session PT - End of Session Equipment Utilized During Treatment: Gait belt;Oxygen Activity Tolerance: Patient tolerated treatment well;Patient limited by pain;Patient limited by fatigue Patient left: with call bell/phone within reach;in bed Nurse Communication: Mobility status       [name],[name] Mabie 03/29/2013, 10:32 AM [name] Acute Rehabilitation [phone] (pager)

## 2013-03-29 NOTE — Progress Notes (Signed)
Subjective: Rt TG abscess drain placed 11/3 Pt feeling better Output good  Objective: Vital signs in last 24 hours: Temp:  [97.4 F (36.3 C)-101 F (38.3 C)] 98.1 F (36.7 C) (11/04 0750) Pulse Rate:  [85-115] 92 (11/04 0750) Resp:  [12-36] 14 (11/04 0750) BP: (90-112)/(42-88) 110/59 mmHg (11/04 0750) SpO2:  [79 %-100 %] 100 % (11/04 0750) Last BM Date: 03/27/13  Intake/Output from previous day: 11/03 0701 - 11/04 0700 In: 2584.2 [I.V.:1500; Blood:734.2; IV Piggyback:350] Out: 615 [Urine:175; Drains:90; Stool:350] Intake/Output this shift:    PE:  Afeb; VSS Wbc 14.7 Drain intact; site clean and dry; tender Output brown;milky 155cc at time of procedure 90 cc since then 10 cc in JP Gr- rods so far   Lab Results:   Recent Labs  03/28/13 0500 03/29/13 0445  WBC 13.6* 14.7*  HGB 6.4* 9.2*  HCT 19.2* 26.6*  PLT 366 324   BMET  Recent Labs  03/28/13 0500 03/29/13 0445  NA 139 142  K 3.4* 3.9  CL 106 109  CO2 24 23  GLUCOSE 110* 80  BUN 6 7  CREATININE 1.00 1.03  CALCIUM 7.6* 7.4*   PT/INR  Recent Labs  03/28/13 1030  LABPROT 14.6  INR 1.16   ABG No results found for this basename: PHART, PCO2, PO2, HCO3,  in the last 72 hours  Studies/Results: Dg Chest 2 View  03/27/2013   CLINICAL DATA:  Fever. Shortness breath. Abdominal pain. Postop or small bowel obstruction and hernia repair.  EXAM: CHEST  2 VIEW  COMPARISON:  03/21/2013  FINDINGS: Right arm PICC line is seen with tip overlying the superior cavoatrial junction.  Pulmonary emphysema again demonstrated. No evidence of pulmonary infiltrate or edema. No evidence of pleural effusion. Heart size is normal. No mass or lymphadenopathy identified. 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  IMPRESSION: New right arm PICC line in appropriate position.  Emphysema. No active disease.   Electronically Signed   By: Myles Rosenthal M.D.   On:  03/27/2013 14:33   Ct Abdomen Pelvis W Contrast  03/27/2013   CLINICAL DATA:  62 year old female with postoperative fever abdominal pain nausea and vomiting. Recent small bowel obstruction and status postoperative laparotomy for adhesions on 03/16/2013. MRSA bacteremia. Hypotension. Initial encounter.  EXAM: CT ABDOMEN AND PELVIS WITH CONTRAST  TECHNIQUE: Multidetector CT imaging of the abdomen and pelvis was performed using the standard protocol following bolus administration of intravenous contrast.  CONTRAST:  OMNIPAQUE IOHEXOL 300 MG/ML  SOLN  COMPARISON:  Abdominal radiographs 03/16/2013. Preoperative CT Abdomen and Pelvis 03/14/13.  FINDINGS: Negative lung bases. No pericardial or pleural effusion. No acute osseous abnormality identified.  Chronic changes of subtotal colectomy. Blind-ending distal colon segment, rectum and sigmoid, appears stable.  New rim enhancing fluid collection in the deep lower abdomen and pelvis encompassing 8.3 x 6.0 x 10 cm. There appears to be a small amount of mesenteric fat associated with this collection. Oral contrast was administered, but has not significantly opacified the distal small bowel. Postoperative changes to the ventral abdomen with skin staples in place. More simple appearing fluid tracking along the subcutaneous incision site (series 2, image 38 - 57). Near to the right abdominal ostomy there is a small 17 mm rim enhancing collection suspected (image 41). Subjacent to the right ventral abdominal wall at the level of surgery there is a crescentic rim enhancing collection (image 34) encompassing 5.2 x 0.9 x 2.2 cm.  No pneumoperitoneum.  Mildly dilated mid small bowel loops measuring up to 33 mm diameter. Transition seems to be in the area of the largest abscess.  Mild periportal edema in the liver. No discrete liver lesion. Gallbladder with small Phrygian cap again noted. The gallbladder fundus is located near the crescentic abscess, but not definitely  involved. Portal venous system appears patent. Aortoiliac calcified atherosclerosis noted. Major arterial structures in the abdomen and pelvis are patent. Negative bladder. Possible small urethral diverticulum. Surgical clips in the epigastric region again noted. Spleen, pancreas and adrenal glands remain normal. Kidneys are stable. Right mid pole nephrolithiasis. No renal obstruction.  IMPRESSION: 1. Recent postoperative changes with a fairly large rim enhancing fluid collection in the deep lower abdomen and pelvis most compatible with abscess. See series 2, image 59. Smaller abscesses in the right upper quadrant near the ostomy (images 38, and 41). More simple appearing postoperative subcutaneous fluid along the incision site, likely seroma. No pneumoperitoneum.  2.  Mildly dilated mid small bowel loops, favor ileus.  Study discussed by telephone with hospitalist provider L. Easterwood on 03/27/2013 at 19:10 .   Electronically Signed   By: Augusto Gamble M.D.   On: 03/27/2013 19:14   Ct Image Guided Drainage Percut Cath  Peritoneal Retroperit  03/28/2013   CLINICAL DATA:  POSTOP pelvic ABSCESS, ELEVATED WHITE COUNT, FEVER  EXAM: CT-GUIDED TRANS GLUTEAL PELVIC ABSCESS DRAIN INSERTION  Date:  11/3/201411/07/2012 3:32 PM  Radiologist:  Judie Petit. Ruel Favors, MD  Guidance:  CT  MEDICATIONS AND MEDICAL HISTORY: 4 MG VERSED, 150 MCG FENTANYL  ANESTHESIA/SEDATION: 35 MIN  CONTRAST:  NONE.  FLUOROSCOPY TIME:  NONE.  PROCEDURE: Informed consent was obtained from the patient following explanation of the procedure, risks, benefits and alternatives. The patient understands, agrees and consents for the procedure. All questions were addressed. A time out was performed.  Maximal barrier sterile technique utilized including caps, mask, sterile gowns, sterile gloves, large sterile drape, hand hygiene, and BETADINE.  Previous imaging reviewed. Patient was positioned prone. Noncontrast localization CT performed. The pelvic abscess was  localized. Under sterile conditions and local anesthesia, an 18 gauge 10 cm access needle was advanced from a right trans gluteal approach into the collection. There was return of purulent fluid. Sample sent for Gram stain culture. Guidewire inserted followed by tract dilatation to insert 10 [place] drain. Catheter position confirmed with CT. Syringe aspiration yielded 155 cc purulent fluid. Catheter is secured with a Prolene suture and connected to external suction bulb. Sterile dressing applied. No immediate complication.  COMPLICATIONS: NO IMMEDIATE  IMPRESSION: Successful CT-guided right trans gluteal pelvic abscess drain insertion   Electronically Signed   By: Ruel Favors M.D.   On: 03/28/2013 15:41    Anti-infectives: Anti-infectives   Start     Dose/Rate Route Frequency Ordered Stop   03/27/13 2200  piperacillin-tazobactam (ZOSYN) IVPB 3.375 g     3.375 g 12.5 mL/hr over 240 Minutes Intravenous 3 times per day 03/27/13 1530     03/27/13 2200  hydroxychloroquine (PLAQUENIL) tablet 200 mg     200 mg Oral 2 times daily 03/27/13 1915     03/27/13 1430  piperacillin-tazobactam (ZOSYN) IVPB 3.375 g     3.375 g 100 mL/hr over 30 Minutes Intravenous  Once 03/27/13 1415 03/27/13 1514   03/27/13 0300  vancomycin (VANCOCIN) 500 mg in sodium chloride 0.9 % 100 mL IVPB     500 mg 100 mL/hr over 60 Minutes Intravenous Every 12 hours 03/27/13 2351  03/27/13 0100  vancomycin (VANCOCIN) 500 mg in sodium chloride 0.9 % 100 mL IVPB  Status:  Discontinued     500 mg 100 mL/hr over 60 Minutes Intravenous Every 12 hours 03/27/13 2227 03/27/13 2340      Assessment/Plan: s/p * No surgery found * R TG abscess drain intact Output good Will follow Plan per [facility]   LOS: 2 days    [name] A 03/29/2013

## 2013-03-29 NOTE — Progress Notes (Signed)
Spoke with patient at bedside. She is active with [facility] Care Management services for DM and COPD management. Made her aware that her [facility] Coordinator aware of admission. Will continue to follow along. Made inpatient RNCM aware that patient is active with [facility] Care Management. She is also active with [facility] for services as well. [name] appreciative of visit. [name], MSN-Ed, RN,BSN- [facility] [phone]

## 2013-03-29 NOTE — Progress Notes (Signed)
[facility] Consult F/U Note [place] TEAM 1 - Stepdown ICU Team   [name] NWG:[id number] DOB: Dec 06, 1950 DOA: 03/27/2013 PCP: [name], MD  Brief narrative: 62 y.o. female recently discharged from the hospital after undergoing laparotomy for adhesions on 10/22. Post op she was found to have MRSA Bacteremia in one set of blood cultures. TTE and TEE did not show any vegetations. She was discharged on IV vancomycin to complete a 14 day course. She got a PICC line before discharge,. She returned to the ER w/ c/o fever chills, mild abdominal discomfort and nausea, no vomiting. On arrival to ED, she was found to be febrile , hypotensive, tachycardic. Labs revealed leukocytosis.  A CXR was negative for pneumonia and UA pending.   Assessment/Plan:    Sepsis -due to pelvic abscesses -treat underlying causes and provide supportive care -PCT at admit 168 and has decreased to 124 with empiric anbxs    Anemia (symptomatic) -likely due to recent surgery in combination with smoldering infection/sepsis -transfused 2 units PRBCs due to soft BP readings 11/3 -Hgb up from 6.4 to 9.2 on 11/4 -baseline Hgb ~ 10-11    Leukocytosis/Pelvic abscess (post op) -post perc drain per IR 11/3 -cx's pending: abundant GNRs - ID following      DIABETES, TYPE 2 -CBGs stable    MULTIFOCAL ATRIAL TACHYCARDIA -recent admit for same -HR currently controlled -due to soft BP held BB/CCB but will resume low dose CCB 11/4    Chronic hypoxic respiratory failure/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 No N/V and pelvic pain improved  Objective: Blood pressure 110/59, pulse 92, temperature 98.1 F (36.7 C), temperature source Oral, resp. rate 14, height 5\' 1"  (1.549 m), weight 58.2 kg (128 lb 4.9 oz), SpO2 100.00%.  Intake/Output Summary (Last 24 hours) at 03/29/13 0942 Last data filed at 03/29/13 0700  Gross per 24 hour  Intake 2584.16 ml  Output    615 ml  Net 1969.16 ml   Exam: General: No acute respiratory distress Lungs: Clear to auscultation bilaterally without wheezes or crackles, 2L Cardiovascular: Regular rate and rhythm without murmur gallop or rub normal S1 and S2, no peripheral edema or JVD Abdomen: Somewhat tender primarily suprapubic region, nondistended, soft, bowel sounds positive, no rebound, no ascites, no appreciable mass Musculoskeletal: No significant cyanosis, clubbing of bilateral lower extremities   Scheduled Meds:  Scheduled Meds: . arformoterol  15 mcg Nebulization BID  . budesonide  0.25 mg Nebulization BID  . citalopram  20 mg Oral Daily  . diltiazem  120 mg Oral Daily  . enoxaparin (LOVENOX) injection  40 mg Subcutaneous Q24H  . famotidine  20 mg Oral BID  . fluticasone  2 spray Each Nare Daily  . hydroxychloroquine  200 mg Oral BID  . insulin aspart  0-9 Units Subcutaneous TID WC  . piperacillin-tazobactam (ZOSYN)  IV  3.375 g Intravenous Q8H  . sodium chloride  3 mL Intravenous Q12H  . tiotropium  18 mcg Inhalation Daily  . vancomycin  500 mg Intravenous Q12H    Data Reviewed: Basic Metabolic Panel:  Recent Labs Lab 03/27/13 1333  03/27/13 1550 03/27/13 1559 03/28/13 0500 03/29/13 0445  NA 137  --  141 139 142  K 3.2*  --  3.0* 3.4* 3.9  CL 100  --  100 106 109  CO2 27  --   --  24 23  GLUCOSE 87  --  80 110* 80  BUN 8  --  6 6 7   CREATININE 1.01  --  1.20* 1.00 1.03  CALCIUM 7.9*  --   --  7.6* 7.4*  MG  --  1.6  --   --   --    Liver Function Tests:  Recent Labs Lab 03/27/13 1333  AST 30  ALT 23  ALKPHOS 132*    BILITOT 0.4  PROT 5.7*  ALBUMIN 2.1*   No results found for this basename: LIPASE, AMYLASE,  in the last 168 hours No results found for this basename: AMMONIA,  in the last 168 hours CBC:  Recent Labs Lab 03/27/13 1333 03/27/13 1559 03/28/13 0500 03/29/13 0445  WBC 20.7*  --  13.6* 14.7*  NEUTROABS 18.2*  --   --   --   HGB 7.6* 7.5* 6.4* 9.2*  HCT 22.6* 22.0* 19.2* 26.6*  MCV 84.6  --  83.8 82.1  PLT 334  --  366 324   CBG:  Recent Labs Lab 03/28/13 1154 03/28/13 1748 03/28/13 2055 03/29/13 0049 03/29/13 0752  GLUCAP 93 90 90 93 71    Recent Results (from the past 240 hour(s))  URINE CULTURE     Status: None   Collection Time    03/21/13 10:11 AM      Result Value Range Status   Specimen Description URINE, RANDOM   Final   Special Requests NONE   Final   Culture  Setup Time     Final   Value: 03/21/2013 11:01     Performed at [facility] Count     Final   Value: NO GROWTH     Performed at [facility]   Culture     Final   Value: NO GROWTH     Performed at [facility]   Report Status 03/22/2013 FINAL   Final  URINE CULTURE     Status: None   Collection Time    03/27/13  1:53 PM      Result Value Range Status   Specimen Description URINE, CLEAN CATCH   Final   Special Requests NONE   Final   Culture  Setup Time     Final   Value: 03/27/2013 21:50     Performed at [facility] Count     Final   Value: 15,000 COLONIES/ML     Performed at [facility]   Culture     Final   Value: YEAST     Performed at [facility]   Report Status 03/28/2013 FINAL   Final  CULTURE, BLOOD (ROUTINE X 2)     Status: None   Collection Time    03/27/13  1:55 PM      Result Value Range Status   Specimen Description BLOOD PICC LINE   Final   Special Requests BOTTLES DRAWN AEROBIC AND ANAEROBIC 10CC   Final   Culture  Setup Time     Final   Value: 03/27/2013 21:48     Performed at [facility]    Culture     Final   Value:  BLOOD CULTURE RECEIVED NO GROWTH TO DATE CULTURE WILL BE HELD FOR 5 DAYS BEFORE ISSUING A FINAL NEGATIVE REPORT     Performed at [facility]   Report Status PENDING   Incomplete  CULTURE, BLOOD (ROUTINE X 2)     Status: None   Collection Time    03/27/13  3:25 PM      Result Value Range Status   Specimen Description BLOOD RIGHT HAND   Final   Special Requests     Final   Value: BOTTLES DRAWN AEROBIC AND ANAEROBIC 10CC BLUE,5CC RED   Culture  Setup Time     Final   Value: 03/27/2013 21:48     Performed at [facility]   Culture     Final   Value:        BLOOD CULTURE RECEIVED NO GROWTH TO DATE CULTURE WILL BE HELD FOR 5 DAYS BEFORE ISSUING A FINAL NEGATIVE REPORT     Performed at [facility]   Report Status PENDING   Incomplete  MRSA PCR SCREENING     Status: None   Collection Time    03/27/13  7:17 PM      Result Value Range Status   MRSA by PCR NEGATIVE  NEGATIVE Final   Comment:            The GeneXpert MRSA Assay (FDA     approved for NASAL specimens     only), is one component of a     comprehensive MRSA colonization     surveillance program. It is not     intended to diagnose MRSA     infection nor to guide or     monitor treatment for     MRSA infections.  CULTURE, ROUTINE-ABSCESS     Status: None   Collection Time    03/28/13  3:27 PM      Result Value Range Status   Specimen Description ABSCESS BUTTOCKS RIGHT   Final   Special Requests NONE   Final   Gram Stain     Final   Value: MODERATE WBC PRESENT,BOTH PMN AND MONONUCLEAR     NO SQUAMOUS EPITHELIAL CELLS SEEN     ABUNDANT GRAM NEGATIVE RODS     Performed at [facility]   Culture PENDING   Incomplete   Report Status PENDING   Incomplete     Studies:  Recent x-ray studies have been reviewed in detail by the Attending Physician     Junious Silk, ANP [facility] Office  [phone] Pager [phone]  **If unable to reach  the above provider after paging please contact the Flow Manager @ [phone]  On-Call/Text Page:      [email or web address]      password [email or web address]  If 7PM-7AM, please contact night-coverage [email or web address] Password [facility] 03/29/2013, 9:42 AM   LOS: 2 days   I have examined the patient, reviewed the chart and modified the above note which I agree with.   Terry Abila,MD [phone] 03/29/2013, 1:32 PM

## 2013-03-29 NOTE — Progress Notes (Signed)
Patient ID: [name], female   DOB: [birth year], 62 y.o.   MRN: [id number]    Subjective: Pt doesn't want to move.  Still has some abdominal pain.  Answers "I don't know" to every question.    Objective: Vital signs in last 24 hours: Temp:  [97.4 F (36.3 C)-101 F (38.3 C)] 97.6 F (36.4 C) (11/04 0400) Pulse Rate:  [85-115] 92 (11/04 0700) Resp:  [12-36] 15 (11/04 0700) BP: (90-112)/(42-88) 111/60 mmHg (11/04 0700) SpO2:  [79 %-100 %] 99 % (11/04 0700) Last BM Date: 03/27/13  Intake/Output from previous day: 11/03 0701 - 11/04 0700 In: 2584.2 [I.V.:1500; Blood:734.2; IV Piggyback:350] Out: 615 [Urine:175; Drains:90; Stool:350] Intake/Output this shift:    PE: Abd: soft, still very tender, +BS, stool in ileostomy, ND, drain with some cloudy serous output. Heart: regular  Lab Results:   Recent Labs  03/28/13 0500 03/29/13 0445  WBC 13.6* 14.7*  HGB 6.4* 9.2*  HCT 19.2* 26.6*  PLT 366 324   BMET  Recent Labs  03/28/13 0500 03/29/13 0445  NA 139 142  K 3.4* 3.9  CL 106 109  CO2 24 23  GLUCOSE 110* 80  BUN 6 7  CREATININE 1.00 1.03  CALCIUM 7.6* 7.4*   PT/INR  Recent Labs  03/28/13 1030  LABPROT 14.6  INR 1.16   CMP     Component Value Date/Time   NA 142 03/29/2013 0445   K 3.9 03/29/2013 0445   CL 109 03/29/2013 0445   CO2 23 03/29/2013 0445   GLUCOSE 80 03/29/2013 0445   BUN 7 03/29/2013 0445   CREATININE 1.03 03/29/2013 0445   CREATININE 1.04 08/27/2012 1054   CALCIUM 7.4* 03/29/2013 0445   CALCIUM 9.0 07/15/2011 0920   PROT 5.7* 03/27/2013 1333   ALBUMIN 2.1* 03/27/2013 1333   AST 30 03/27/2013 1333   ALT 23 03/27/2013 1333   ALKPHOS 132* 03/27/2013 1333   BILITOT 0.4 03/27/2013 1333   GFRNONAA 57* 03/29/2013 0445   GFRAA 66* 03/29/2013 0445   Lipase     Component Value Date/Time   LIPASE 23 03/14/2013 0940       Studies/Results: Dg Chest 2 View  03/27/2013   CLINICAL DATA:  Fever. Shortness breath. Abdominal pain. Postop or small  bowel obstruction and hernia repair.  EXAM: CHEST  2 VIEW  COMPARISON:  03/21/2013  FINDINGS: Right arm PICC line is seen with tip overlying the superior cavoatrial junction.  Pulmonary emphysema again demonstrated. No evidence of pulmonary infiltrate or edema. No evidence of pleural effusion. Heart size is normal. No mass or lymphadenopathy identified. 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  IMPRESSION: New right arm PICC line in appropriate position.  Emphysema. No active disease.   Electronically Signed   By: Myles Rosenthal M.D.   On: 03/27/2013 14:33   Ct Abdomen Pelvis W Contrast  03/27/2013   CLINICAL DATA:  62 year old female with postoperative fever abdominal pain nausea and vomiting. Recent small bowel obstruction and status postoperative laparotomy for adhesions on 03/16/2013. MRSA bacteremia. Hypotension. Initial encounter.  EXAM: CT ABDOMEN AND PELVIS WITH CONTRAST  TECHNIQUE: Multidetector CT imaging of the abdomen and pelvis was performed using the standard protocol following bolus administration of intravenous contrast.  CONTRAST:  OMNIPAQUE IOHEXOL 300 MG/ML  SOLN  COMPARISON:  Abdominal radiographs 03/16/2013. Preoperative CT Abdomen and Pelvis 03/14/13.  FINDINGS: Negative lung bases. No pericardial or pleural effusion. No acute osseous abnormality identified.  Chronic changes  of subtotal colectomy. Blind-ending distal colon segment, rectum and sigmoid, appears stable.  New rim enhancing fluid collection in the deep lower abdomen and pelvis encompassing 8.3 x 6.0 x 10 cm. There appears to be a small amount of mesenteric fat associated with this collection. Oral contrast was administered, but has not significantly opacified the distal small bowel. Postoperative changes to the ventral abdomen with skin staples in place. More simple appearing fluid tracking along the subcutaneous incision site (series 2, image 38 - 57). Near to  the right abdominal ostomy there is a small 17 mm rim enhancing collection suspected (image 41). Subjacent to the right ventral abdominal wall at the level of surgery there is a crescentic rim enhancing collection (image 34) encompassing 5.2 x 0.9 x 2.2 cm.  No pneumoperitoneum. Mildly dilated mid small bowel loops measuring up to 33 mm diameter. Transition seems to be in the area of the largest abscess.  Mild periportal edema in the liver. No discrete liver lesion. Gallbladder with small Phrygian cap again noted. The gallbladder fundus is located near the crescentic abscess, but not definitely involved. Portal venous system appears patent. Aortoiliac calcified atherosclerosis noted. Major arterial structures in the abdomen and pelvis are patent. Negative bladder. Possible small urethral diverticulum. Surgical clips in the epigastric region again noted. Spleen, pancreas and adrenal glands remain normal. Kidneys are stable. Right mid pole nephrolithiasis. No renal obstruction.  IMPRESSION: 1. Recent postoperative changes with a fairly large rim enhancing fluid collection in the deep lower abdomen and pelvis most compatible with abscess. See series 2, image 59. Smaller abscesses in the right upper quadrant near the ostomy (images 38, and 41). More simple appearing postoperative subcutaneous fluid along the incision site, likely seroma. No pneumoperitoneum.  2.  Mildly dilated mid small bowel loops, favor ileus.  Study discussed by telephone with hospitalist provider L. Easterwood on 03/27/2013 at 19:10 .   Electronically Signed   By: Augusto Gamble M.D.   On: 03/27/2013 19:14   Ct Image Guided Drainage Percut Cath  Peritoneal Retroperit  03/28/2013   CLINICAL DATA:  POSTOP pelvic ABSCESS, ELEVATED WHITE COUNT, FEVER  EXAM: CT-GUIDED TRANS GLUTEAL PELVIC ABSCESS DRAIN INSERTION  Date:  11/3/201411/07/2012 3:32 PM  Radiologist:  Judie Petit. Ruel Favors, MD  Guidance:  CT  MEDICATIONS AND MEDICAL HISTORY: 4 MG VERSED, 150 MCG  FENTANYL  ANESTHESIA/SEDATION: 35 MIN  CONTRAST:  NONE.  FLUOROSCOPY TIME:  NONE.  PROCEDURE: Informed consent was obtained from the patient following explanation of the procedure, risks, benefits and alternatives. The patient understands, agrees and consents for the procedure. All questions were addressed. A time out was performed.  Maximal barrier sterile technique utilized including caps, mask, sterile gowns, sterile gloves, large sterile drape, hand hygiene, and BETADINE.  Previous imaging reviewed. Patient was positioned prone. Noncontrast localization CT performed. The pelvic abscess was localized. Under sterile conditions and local anesthesia, an 18 gauge 10 cm access needle was advanced from a right trans gluteal approach into the collection. There was return of purulent fluid. Sample sent for Gram stain culture. Guidewire inserted followed by tract dilatation to insert 10 [place] drain. Catheter position confirmed with CT. Syringe aspiration yielded 155 cc purulent fluid. Catheter is secured with a Prolene suture and connected to external suction bulb. Sterile dressing applied. No immediate complication.  COMPLICATIONS: NO IMMEDIATE  IMPRESSION: Successful CT-guided right trans gluteal pelvic abscess drain insertion   Electronically Signed   By: Ruel Favors M.D.   On: 03/28/2013 15:41  Anti-infectives: Anti-infectives   Start     Dose/Rate Route Frequency Ordered Stop   03/27/13 2200  piperacillin-tazobactam (ZOSYN) IVPB 3.375 g     3.375 g 12.5 mL/hr over 240 Minutes Intravenous 3 times per day 03/27/13 1530     03/27/13 2200  hydroxychloroquine (PLAQUENIL) tablet 200 mg     200 mg Oral 2 times daily 03/27/13 1915     03/27/13 1430  piperacillin-tazobactam (ZOSYN) IVPB 3.375 g     3.375 g 100 mL/hr over 30 Minutes Intravenous  Once 03/27/13 1415 03/27/13 1514   03/27/13 0300  vancomycin (VANCOCIN) 500 mg in sodium chloride 0.9 % 100 mL IVPB     500 mg 100 mL/hr over 60 Minutes  Intravenous Every 12 hours 03/27/13 2351     03/27/13 0100  vancomycin (VANCOCIN) 500 mg in sodium chloride 0.9 % 100 mL IVPB  Status:  Discontinued     500 mg 100 mL/hr over 60 Minutes Intravenous Every 12 hours 03/27/13 2227 03/27/13 2340       Assessment/Plan  1. S/p ex lap with LOA and peristomal and ventral hernia repair 2. Intra-abdominal abscess, s/p perc drain 3. Anemia, improved after transfusion 4. COPD 5. DM 6. Crohn's disease 7. RA 8. Recent MRSA bacteremia  Plan: 1. PT eval and treatment.  Patient scared to get up and move and suspect will lay in bed and not mobilize if we just let her 2. Anemia stable at 9.6 after transfusions. 3. Recheck labs in am 4. Culture shows G - rods for now.  Still pre-lim 5. Appreciate ID assistance  6. Cont vanc and zosyn 7. Clear liquid diet today 8. One more day in SDU, if ok tomorrow, she will go to the floor 9. CBGs are stable and controlled 10. Cont to hold BP meds due to normal to low BP state.  LOS: 2 days    Marcus Groll E 03/29/2013, 7:50 AM Pager: [phone]

## 2013-03-29 NOTE — Progress Notes (Signed)
[facility]    Subjective: C./o abdominal and back pain   Antibiotics:  Anti-infectives   Start     Dose/Rate Route Frequency Ordered Stop   03/27/13 2200  piperacillin-tazobactam (ZOSYN) IVPB 3.375 g     3.375 g 12.5 mL/hr over 240 Minutes Intravenous 3 times per day 03/27/13 1530     03/27/13 2200  hydroxychloroquine (PLAQUENIL) tablet 200 mg     200 mg Oral 2 times daily 03/27/13 1915     03/27/13 1430  piperacillin-tazobactam (ZOSYN) IVPB 3.375 g     3.375 g 100 mL/hr over 30 Minutes Intravenous  Once 03/27/13 1415 03/27/13 1514   03/27/13 0300  vancomycin (VANCOCIN) 500 mg in sodium chloride 0.9 % 100 mL IVPB     500 mg 100 mL/hr over 60 Minutes Intravenous Every 12 hours 03/27/13 2351     03/27/13 0100  vancomycin (VANCOCIN) 500 mg in sodium chloride 0.9 % 100 mL IVPB  Status:  Discontinued     500 mg 100 mL/hr over 60 Minutes Intravenous Every 12 hours 03/27/13 2227 03/27/13 2340      Medications: Scheduled Meds: . arformoterol  15 mcg Nebulization BID  . budesonide  0.25 mg Nebulization BID  . citalopram  20 mg Oral Daily  . diltiazem  120 mg Oral Daily  . enoxaparin (LOVENOX) injection  40 mg Subcutaneous Q24H  . famotidine  20 mg Oral BID  . fluticasone  2 spray Each Nare Daily  . hydroxychloroquine  200 mg Oral BID  . insulin aspart  0-9 Units Subcutaneous TID WC  . piperacillin-tazobactam (ZOSYN)  IV  3.375 g Intravenous Q8H  . sodium chloride  3 mL Intravenous Q12H  . tiotropium  18 mcg Inhalation Daily  . vancomycin  500 mg Intravenous Q12H   Continuous Infusions: . sodium chloride 150 mL/hr at 03/29/13 1129   PRN Meds:.acetaminophen, iohexol, LORazepam, morphine injection, sodium chloride   Objective: Weight change:   Intake/Output Summary (Last 24 hours) at 03/29/13 1237 Last data filed at 03/29/13 1100  Gross per 24 hour  Intake 2949.16 ml  Output    700 ml  Net 2249.16 ml   Blood pressure 115/62, pulse 95,  temperature 98.4 F (36.9 C), temperature source Oral, resp. rate 17, height 5\' 1"  (1.549 m), weight 128 lb 4.9 oz (58.2 kg), SpO2 100.00%. Temp:  [97.4 F (36.3 C)-101 F (38.3 C)] 98.4 F (36.9 C) (11/04 1211) Pulse Rate:  [85-110] 95 (11/04 1211) Resp:  [12-21] 17 (11/04 1211) BP: (90-115)/(42-88) 115/62 mmHg (11/04 1211) SpO2:  [79 %-100 %] 100 % (11/04 1211)  Physical Exam: General: Alert and awake, oriented x3, not in any acute distress.  HEENT: anicteric sclera, pupils reactive to light and accommodation, EOMI, oropharynx clear and without exudate  CVS regular rate, normal r, no murmur rubs or gallops  Chest: clear to auscultation bilaterally, no wheezing, rales or rhonchi  Abdomen: dressings in place, JP drain with hazy material in place Extremities: no clubbing or edema noted bilaterally  Skin: no rashes  Neuro: nonfocal,  Lab Results:  Recent Labs  03/28/13 0500 03/29/13 0445  WBC 13.6* 14.7*  HGB 6.4* 9.2*  HCT 19.2* 26.6*  PLT 366 324    BMET  Recent Labs  03/28/13 0500 03/29/13 0445  NA 139 142  K 3.4* 3.9  CL 106 109  CO2 24 23  GLUCOSE 110* 80  BUN 6 7  CREATININE 1.00 1.03  CALCIUM 7.6* 7.4*  Micro Results: Recent Results (from the past 240 hour(s))  URINE CULTURE     Status: None   Collection Time    03/21/13 10:11 AM      Result Value Range Status   Specimen Description URINE, RANDOM   Final   Special Requests NONE   Final   Culture  Setup Time     Final   Value: 03/21/2013 11:01     Performed at [facility] Count     Final   Value: NO GROWTH     Performed at [facility]   Culture     Final   Value: NO GROWTH     Performed at [facility]   Report Status 03/22/2013 FINAL   Final  URINE CULTURE     Status: None   Collection Time    03/27/13  1:53 PM      Result Value Range Status   Specimen Description URINE, CLEAN CATCH   Final   Special Requests NONE   Final   Culture  Setup Time     Final     Value: 03/27/2013 21:50     Performed at [facility] Count     Final   Value: 15,000 COLONIES/ML     Performed at [facility]   Culture     Final   Value: YEAST     Performed at [facility]   Report Status 03/28/2013 FINAL   Final  CULTURE, BLOOD (ROUTINE X 2)     Status: None   Collection Time    03/27/13  1:55 PM      Result Value Range Status   Specimen Description BLOOD PICC LINE   Final   Special Requests BOTTLES DRAWN AEROBIC AND ANAEROBIC 10CC   Final   Culture  Setup Time     Final   Value: 03/27/2013 21:48     Performed at [facility]   Culture     Final   Value:        BLOOD CULTURE RECEIVED NO GROWTH TO DATE CULTURE WILL BE HELD FOR 5 DAYS BEFORE ISSUING A FINAL NEGATIVE REPORT     Performed at [facility]   Report Status PENDING   Incomplete  CULTURE, BLOOD (ROUTINE X 2)     Status: None   Collection Time    03/27/13  3:25 PM      Result Value Range Status   Specimen Description BLOOD RIGHT HAND   Final   Special Requests     Final   Value: BOTTLES DRAWN AEROBIC AND ANAEROBIC 10CC BLUE,5CC RED   Culture  Setup Time     Final   Value: 03/27/2013 21:48     Performed at [facility]   Culture     Final   Value:        BLOOD CULTURE RECEIVED NO GROWTH TO DATE CULTURE WILL BE HELD FOR 5 DAYS BEFORE ISSUING A FINAL NEGATIVE REPORT     Performed at [facility]   Report Status PENDING   Incomplete  MRSA PCR SCREENING     Status: None   Collection Time    03/27/13  7:17 PM      Result Value Range Status   MRSA by PCR NEGATIVE  NEGATIVE Final   Comment:            The GeneXpert MRSA Assay (FDA     approved  for NASAL specimens     only), is one component of a     comprehensive MRSA colonization     surveillance program. It is not     intended to diagnose MRSA     infection nor to guide or     monitor treatment for     MRSA infections.  CULTURE, ROUTINE-ABSCESS     Status: None   Collection  Time    03/28/13  3:27 PM      Result Value Range Status   Specimen Description ABSCESS BUTTOCKS RIGHT   Final   Special Requests NONE   Final   Gram Stain     Final   Value: MODERATE WBC PRESENT,BOTH PMN AND MONONUCLEAR     NO SQUAMOUS EPITHELIAL CELLS SEEN     ABUNDANT GRAM NEGATIVE RODS     Performed at [facility]   Culture PENDING   Incomplete   Report Status PENDING   Incomplete  ANAEROBIC CULTURE     Status: None   Collection Time    03/28/13  3:27 PM      Result Value Range Status   Specimen Description ABSCESS BUTTOCKS RIGHT   Final   Special Requests NONE   Final   Gram Stain PENDING   Incomplete   Culture     Final   Value: NO ANAEROBES ISOLATED; CULTURE IN PROGRESS FOR 5 DAYS     Performed at [facility]   Report Status PENDING   Incomplete    Studies/Results: Dg Chest 2 View  03/27/2013   CLINICAL DATA:  Fever. Shortness breath. Abdominal pain. Postop or small bowel obstruction and hernia repair.  EXAM: CHEST  2 VIEW  COMPARISON:  03/21/2013  FINDINGS: Right arm PICC line is seen with tip overlying the superior cavoatrial junction.  Pulmonary emphysema again demonstrated. No evidence of pulmonary infiltrate or edema. No evidence of pleural effusion. Heart size is normal. No mass or lymphadenopathy identified. 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  IMPRESSION: New right arm PICC line in appropriate position.  Emphysema. No active disease.   Electronically Signed   By: Myles Rosenthal M.D.   On: 03/27/2013 14:33   Ct Abdomen Pelvis W Contrast  03/27/2013   CLINICAL DATA:  62 year old female with postoperative fever abdominal pain nausea and vomiting. Recent small bowel obstruction and status postoperative laparotomy for adhesions on 03/16/2013. MRSA bacteremia. Hypotension. Initial encounter.  EXAM: CT ABDOMEN AND PELVIS WITH CONTRAST  TECHNIQUE: Multidetector CT imaging of the abdomen and  pelvis was performed using the standard protocol following bolus administration of intravenous contrast.  CONTRAST:  OMNIPAQUE IOHEXOL 300 MG/ML  SOLN  COMPARISON:  Abdominal radiographs 03/16/2013. Preoperative CT Abdomen and Pelvis 03/14/13.  FINDINGS: Negative lung bases. No pericardial or pleural effusion. No acute osseous abnormality identified.  Chronic changes of subtotal colectomy. Blind-ending distal colon segment, rectum and sigmoid, appears stable.  New rim enhancing fluid collection in the deep lower abdomen and pelvis encompassing 8.3 x 6.0 x 10 cm. There appears to be a small amount of mesenteric fat associated with this collection. Oral contrast was administered, but has not significantly opacified the distal small bowel. Postoperative changes to the ventral abdomen with skin staples in place. More simple appearing fluid tracking along the subcutaneous incision site (series 2, image 38 - 57). Near to the right abdominal ostomy there is a small 17 mm rim enhancing collection suspected (image 41). Subjacent to the  right ventral abdominal wall at the level of surgery there is a crescentic rim enhancing collection (image 34) encompassing 5.2 x 0.9 x 2.2 cm.  No pneumoperitoneum. Mildly dilated mid small bowel loops measuring up to 33 mm diameter. Transition seems to be in the area of the largest abscess.  Mild periportal edema in the liver. No discrete liver lesion. Gallbladder with small Phrygian cap again noted. The gallbladder fundus is located near the crescentic abscess, but not definitely involved. Portal venous system appears patent. Aortoiliac calcified atherosclerosis noted. Major arterial structures in the abdomen and pelvis are patent. Negative bladder. Possible small urethral diverticulum. Surgical clips in the epigastric region again noted. Spleen, pancreas and adrenal glands remain normal. Kidneys are stable. Right mid pole nephrolithiasis. No renal obstruction.  IMPRESSION: 1. Recent  postoperative changes with a fairly large rim enhancing fluid collection in the deep lower abdomen and pelvis most compatible with abscess. See series 2, image 59. Smaller abscesses in the right upper quadrant near the ostomy (images 38, and 41). More simple appearing postoperative subcutaneous fluid along the incision site, likely seroma. No pneumoperitoneum.  2.  Mildly dilated mid small bowel loops, favor ileus.  Study discussed by telephone with hospitalist provider L. Easterwood on 03/27/2013 at 19:10 .   Electronically Signed   By: Augusto Gamble M.D.   On: 03/27/2013 19:14   Ct Image Guided Drainage Percut Cath  Peritoneal Retroperit  03/28/2013   CLINICAL DATA:  POSTOP pelvic ABSCESS, ELEVATED WHITE COUNT, FEVER  EXAM: CT-GUIDED TRANS GLUTEAL PELVIC ABSCESS DRAIN INSERTION  Date:  11/3/201411/07/2012 3:32 PM  Radiologist:  Judie Petit. Ruel Favors, MD  Guidance:  CT  MEDICATIONS AND MEDICAL HISTORY: 4 MG VERSED, 150 MCG FENTANYL  ANESTHESIA/SEDATION: 35 MIN  CONTRAST:  NONE.  FLUOROSCOPY TIME:  NONE.  PROCEDURE: Informed consent was obtained from the patient following explanation of the procedure, risks, benefits and alternatives. The patient understands, agrees and consents for the procedure. All questions were addressed. A time out was performed.  Maximal barrier sterile technique utilized including caps, mask, sterile gowns, sterile gloves, large sterile drape, hand hygiene, and BETADINE.  Previous imaging reviewed. Patient was positioned prone. Noncontrast localization CT performed. The pelvic abscess was localized. Under sterile conditions and local anesthesia, an 18 gauge 10 cm access needle was advanced from a right trans gluteal approach into the collection. There was return of purulent fluid. Sample sent for Gram stain culture. Guidewire inserted followed by tract dilatation to insert 10 [place] drain. Catheter position confirmed with CT. Syringe aspiration yielded 155 cc purulent fluid. Catheter is secured with  a Prolene suture and connected to external suction bulb. Sterile dressing applied. No immediate complication.  COMPLICATIONS: NO IMMEDIATE  IMPRESSION: Successful CT-guided right trans gluteal pelvic abscess drain insertion   Electronically Signed   By: Ruel Favors M.D.   On: 03/28/2013 15:41      Assessment/Plan: [name] is a 62 y.o. female with   Crohn's disease diabetes mellitus recently discharged from the hospital after undergoing laparotomy for adhesions on 10/22. Post op she was found to have MRSA Bacteremia in one set of blood cultures. TTE and TEE did not show any vegetations. She was discharged on IV vancomycin, now readmitted with intra-abdominal abscess currently on vancomycin and Zosyn with plans for IR drainage yesterday with GNR growing on culture  #1 Intrabdominal abscess:  --vancomycin with zosyn reasonable for now   #2 MRSA Bacteremia: no vegetations on TEE  Plan was for 2 weeks  of IV vancomycin from first negative blood culture on 03/18/13 but we will reconsider duration depending on what we find with intrabdominal abscess   #3 Screening: check HIV, hep panel    LOS: 2 days   Acey Lav 03/29/2013, 12:37 PM

## 2013-03-30 DIAGNOSIS — J9610 Chronic respiratory failure, unspecified whether with hypoxia or hypercapnia: Secondary | ICD-10-CM

## 2013-03-30 DIAGNOSIS — E43 Unspecified severe protein-calorie malnutrition: Secondary | ICD-10-CM | POA: Diagnosis present

## 2013-03-30 DIAGNOSIS — E876 Hypokalemia: Secondary | ICD-10-CM

## 2013-03-30 LAB — VANCOMYCIN, TROUGH: Vancomycin Tr: 15.5 ug/mL (ref 10.0–20.0)

## 2013-03-30 LAB — CBC
HCT: 26.7 % — ABNORMAL LOW (ref 36.0–46.0)
Hemoglobin: 9 g/dL — ABNORMAL LOW (ref 12.0–15.0)
MCH: 28 pg (ref 26.0–34.0)
MCHC: 33.7 g/dL (ref 30.0–36.0)
MCV: 82.9 fL (ref 78.0–100.0)
Platelets: 363 K/uL (ref 150–400)
RBC: 3.22 MIL/uL — ABNORMAL LOW (ref 3.87–5.11)
RDW: 14.7 % (ref 11.5–15.5)
WBC: 11.2 K/uL — ABNORMAL HIGH (ref 4.0–10.5)

## 2013-03-30 LAB — GLUCOSE, CAPILLARY: Glucose-Capillary: 85 mg/dL (ref 70–99)

## 2013-03-30 LAB — BASIC METABOLIC PANEL WITH GFR
BUN: 5 mg/dL — ABNORMAL LOW (ref 6–23)
CO2: 23 meq/L (ref 19–32)
Calcium: 7.5 mg/dL — ABNORMAL LOW (ref 8.4–10.5)
Chloride: 112 meq/L (ref 96–112)
Creatinine, Ser: 0.93 mg/dL (ref 0.50–1.10)
GFR calc Af Amer: 74 mL/min — ABNORMAL LOW
GFR calc non Af Amer: 64 mL/min — ABNORMAL LOW
Glucose, Bld: 91 mg/dL (ref 70–99)
Potassium: 3.3 meq/L — ABNORMAL LOW (ref 3.5–5.1)
Sodium: 142 meq/L (ref 135–145)

## 2013-03-30 MED ORDER — POTASSIUM CHLORIDE CRYS ER 20 MEQ PO TBCR
40.0000 meq | EXTENDED_RELEASE_TABLET | Freq: Two times a day (BID) | ORAL | Status: AC
  Administered 2013-03-30 (×2): 40 meq via ORAL
  Filled 2013-03-30 (×2): qty 2

## 2013-03-30 MED ORDER — GUAIFENESIN ER 600 MG PO TB12
1200.0000 mg | ORAL_TABLET | Freq: Two times a day (BID) | ORAL | Status: DC
  Administered 2013-03-30 – 2013-04-06 (×12): 1200 mg via ORAL
  Filled 2013-03-30 (×16): qty 2

## 2013-03-30 MED ORDER — DILTIAZEM HCL 30 MG PO TABS
30.0000 mg | ORAL_TABLET | Freq: Three times a day (TID) | ORAL | Status: DC
  Administered 2013-03-31 – 2013-04-06 (×18): 30 mg via ORAL
  Filled 2013-03-30 (×26): qty 1

## 2013-03-30 NOTE — Clinical Social Work Note (Addendum)
CSW presented bed offers to pt, and pt's first choice was [facility] and second was [facility]. Pt's first choice, [facility], is unable to offer pt a room. CSW called [facility], pt's second-choice facility, to reserve a private room for pt. CSW left [facility] admissions personnel a voicemail, asking them to hold the private room and to call CSW back to notify her that they were able to do this. CSW also called [facility], as they are able to offer a private room, and left a voicemail requesting that facility hold a private room bed for pt. Pt wishes to either have a private room at SNF or to go home with home health if this is unavailable. CSW has notified RNCM of pt's wishes in the event that a private room is not available when pt is ready for discharge.  Addendum: CSW received calls from [facility] and [facility]. [facility] explained that they will hold on to her clinicals and will offer a private room if it is available when pt is ready for discharge. [facility] also will offer a private room if available when pt is ready for discharge. CSW explained this to pt, that she can have a private room if it is available when she is medically ready for discharge.  Maryclare Labrador, MSW, [facility] Clinical Social Worker [phone]

## 2013-03-30 NOTE — Progress Notes (Signed)
Patient ID: [name], female   DOB: 1950/11/10, 62 y.o.   MRN: [id number]    Subjective: Pt c/o some abdominal pain this morning.  Tolerating clear liquids well.  Lite up when Dr. [name] walked into the room.  Objective: Vital signs in last 24 hours: Temp:  [98.4 F (36.9 C)-99.4 F (37.4 C)] 98.9 F (37.2 C) (11/05 0442) Pulse Rate:  [88-95] 88 (11/05 0442) Resp:  [12-19] 19 (11/05 0442) BP: (102-121)/(57-67) 111/65 mmHg (11/05 0442) SpO2:  [100 %] 100 % (11/05 0442) Weight:  [126 lb 12.2 oz (57.5 kg)] 126 lb 12.2 oz (57.5 kg) (11/05 0442) Last BM Date: 03/29/13  Intake/Output from previous day: 11/04 0701 - 11/05 0700 In: 2225 [P.O.:720; I.V.:1350; IV Piggyback:150] Out: 515 [Urine:375; Drains:25; Stool:115] Intake/Output this shift:    PE: Abd: soft, tender, ileostomy with good output, JP drain with some serous drainage and some clotted material, +BS, ND Heart: regular  Lab Results:   Recent Labs  03/29/13 0445 03/30/13 0530  WBC 14.7* 11.2*  HGB 9.2* 9.0*  HCT 26.6* 26.7*  PLT 324 363   BMET  Recent Labs  03/29/13 0445 03/30/13 0530  NA 142 142  K 3.9 3.3*  CL 109 112  CO2 23 23  GLUCOSE 80 91  BUN 7 5*  CREATININE 1.03 0.93  CALCIUM 7.4* 7.5*   PT/INR  Recent Labs  03/28/13 1030  LABPROT 14.6  INR 1.16   CMP     Component Value Date/Time   NA 142 03/30/2013 0530   K 3.3* 03/30/2013 0530   CL 112 03/30/2013 0530   CO2 23 03/30/2013 0530   GLUCOSE 91 03/30/2013 0530   BUN 5* 03/30/2013 0530   CREATININE 0.93 03/30/2013 0530   CREATININE 1.04 08/27/2012 1054   CALCIUM 7.5* 03/30/2013 0530   CALCIUM 9.0 07/15/2011 0920   PROT 5.7* 03/27/2013 1333   ALBUMIN 2.1* 03/27/2013 1333   AST 30 03/27/2013 1333   ALT 23 03/27/2013 1333   ALKPHOS 132* 03/27/2013 1333   BILITOT 0.4 03/27/2013 1333   GFRNONAA 64* 03/30/2013 0530   GFRAA 74* 03/30/2013 0530   Lipase     Component Value Date/Time   LIPASE 23 03/14/2013 0940        Studies/Results: Ct Image Guided Drainage Percut Cath  Peritoneal Retroperit  03/28/2013   CLINICAL DATA:  POSTOP pelvic ABSCESS, ELEVATED WHITE COUNT, FEVER  EXAM: CT-GUIDED TRANS GLUTEAL PELVIC ABSCESS DRAIN INSERTION  Date:  11/3/201411/07/2012 3:32 PM  Radiologist:  Judie Petit. Ruel Favors, MD  Guidance:  CT  MEDICATIONS AND MEDICAL HISTORY: 4 MG VERSED, 150 MCG FENTANYL  ANESTHESIA/SEDATION: 35 MIN  CONTRAST:  NONE.  FLUOROSCOPY TIME:  NONE.  PROCEDURE: Informed consent was obtained from the patient following explanation of the procedure, risks, benefits and alternatives. The patient understands, agrees and consents for the procedure. All questions were addressed. A time out was performed.  Maximal barrier sterile technique utilized including caps, mask, sterile gowns, sterile gloves, large sterile drape, hand hygiene, and BETADINE.  Previous imaging reviewed. Patient was positioned prone. Noncontrast localization CT performed. The pelvic abscess was localized. Under sterile conditions and local anesthesia, an 18 gauge 10 cm access needle was advanced from a right trans gluteal approach into the collection. There was return of purulent fluid. Sample sent for Gram stain culture. Guidewire inserted followed by tract dilatation to insert 10 [place] drain. Catheter position confirmed with CT. Syringe aspiration yielded 155 cc purulent fluid. Catheter is secured with a Prolene  suture and connected to external suction bulb. Sterile dressing applied. No immediate complication.  COMPLICATIONS: NO IMMEDIATE  IMPRESSION: Successful CT-guided right trans gluteal pelvic abscess drain insertion   Electronically Signed   By: Ruel Favors M.D.   On: 03/28/2013 15:41    Anti-infectives: Anti-infectives   Start     Dose/Rate Route Frequency Ordered Stop   03/27/13 2200  piperacillin-tazobactam (ZOSYN) IVPB 3.375 g     3.375 g 12.5 mL/hr over 240 Minutes Intravenous 3 times per day 03/27/13 1530     03/27/13 2200   hydroxychloroquine (PLAQUENIL) tablet 200 mg     200 mg Oral 2 times daily 03/27/13 1915     03/27/13 1430  piperacillin-tazobactam (ZOSYN) IVPB 3.375 g     3.375 g 100 mL/hr over 30 Minutes Intravenous  Once 03/27/13 1415 03/27/13 1514   03/27/13 0300  vancomycin (VANCOCIN) 500 mg in sodium chloride 0.9 % 100 mL IVPB     500 mg 100 mL/hr over 60 Minutes Intravenous Every 12 hours 03/27/13 2351     03/27/13 0100  vancomycin (VANCOCIN) 500 mg in sodium chloride 0.9 % 100 mL IVPB  Status:  Discontinued     500 mg 100 mL/hr over 60 Minutes Intravenous Every 12 hours 03/27/13 2227 03/27/13 2340       Assessment/Plan  1. Pelvic abscess, s/p ex lap with LOA, peristomal and ventral hernia repairs with biologic mesh 2. hypokalemia Patient Active Problem List   Diagnosis Date Noted  . Severe protein-calorie malnutrition 03/30/2013  . Anemia 03/28/2013  . Leukocytosis 03/28/2013  . Pelvic abscess in female 03/28/2013  . Sepsis 03/28/2013  . Chronic respiratory failure 01/19/2013  . Rheumatoid arthritis 05/11/2012  . Cervical dysplasia   . Hematuria 12/05/2011  . Normal coronary arteries 2009, nl-hyperdynamic LVF 2D 2010 12/02/2011  . Low TSH level, 0.08 with T3/T4 WNL 12/02/2011  . Anxiety disorder 11/01/2011  . DIABETES, TYPE 2 12/19/2008  . MULTIFOCAL ATRIAL TACHYCARDIA 08/13/2007  . DEPRESSIVE DISORDER NOT ELSEWHERE CLASSIFIED 06/02/2007  . HYPERLIPIDEMIA 01/27/2007  . Other diseases of vocal cords, history of prior trach 01/27/2007  . 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 GERD 01/27/2007  . CROHN'S DISEASE 01/27/2007  . OSTEOPOROSIS 01/27/2007   Plan: 1. Will transfer to the floor today 2. Appreciate medicine, ID, and Dr. Lynelle Doctor assistance with this patient 3. Cont JP drain, will need repeat CT scan when output down and at least 5-7 days from prior scan 4. Advance to full liquids 5. Replace K+, recheck labs in am  6. Dr.  [name] has added an IS and flutter valve to help with COPD and secretions.  I have added back guaifenesin to daily regimen like she was taking at home. 7. Cont current abx regimen for now.  Cultures are still prelim, but G (-) rods  LOS: 3 days    [name] 03/30/2013, 7:52 AM Pager: [phone]

## 2013-03-30 NOTE — Progress Notes (Signed)
Flushed JP drain with 10ml of NS

## 2013-03-30 NOTE — Progress Notes (Signed)
ANTIBIOTIC CONSULT NOTE - FOLLOW UP  Pharmacy Consult for vancomycin Indication: intrabdominal abscess  Allergies  Allergen Reactions  . Esomeprazole Magnesium Other (See Comments)    NEXIUM - reaction > aggravated pt's Crohn's disease  . Other     Beans, Dander/Dust, Peas, Mushrooms  . Peanut-Containing Drug Products Other (See Comments)    unknown  . Shellfish Allergy Other (See Comments)    unknown  . Surgical Lubricant Other (See Comments)    Burns skin     Patient Measurements: Height: 5\' 1"  (154.9 cm) Weight: 126 lb 12.2 oz (57.5 kg) IBW/kg (Calculated) : 47.8   Vital Signs: Temp: 98.4 F (36.9 C) (11/05 1216) Temp src: Oral (11/05 1216) BP: 118/62 mmHg (11/05 1216) Pulse Rate: 91 (11/05 1216) Intake/Output from previous day: 11/04 0701 - 11/05 0700 In: 2225 [P.O.:720; I.V.:1350; IV Piggyback:150] Out: 515 [Urine:375; Drains:25; Stool:115] Intake/Output from this shift: Total I/O In: -  Out: 600 [Urine:250; Stool:350]  Labs:  Recent Labs  03/28/13 0500 03/29/13 0445 03/30/13 0530  WBC 13.6* 14.7* 11.2*  HGB 6.4* 9.2* 9.0*  PLT 366 324 363  CREATININE 1.00 1.03 0.93   Estimated Creatinine Clearance: 50.5 ml/min (by C-G formula based on Cr of 0.93).  Recent Labs  03/27/13 2050 03/30/13 1430  VANCOTROUGH 25.5* 15.5     Microbiology: Recent Results (from the past 720 hour(s))  MRSA PCR SCREENING     Status: Abnormal   Collection Time    03/14/13  9:45 PM      Result Value Range Status   MRSA by PCR POSITIVE (*) NEGATIVE Final   Comment:            The GeneXpert MRSA Assay (FDA     approved for NASAL specimens     only), is one component of a     comprehensive MRSA colonization     surveillance program. It is not     intended to diagnose MRSA     infection nor to guide or     monitor treatment for     MRSA infections.     RESULT CALLED TO, READ BACK BY AND VERIFIED WITH:     CALLED TO RN ARIEL MUHAMMAD [id number] @0041  [name]  CULTURE,  BLOOD (ROUTINE X 2)     Status: None   Collection Time    03/16/13  6:23 AM      Result Value Range Status   Specimen Description BLOOD RIGHT HAND   Final   Special Requests BOTTLES DRAWN AEROBIC AND ANAEROBIC 10CC EACH   Final   Culture  Setup Time     Final   Value: 03/16/2013 11:09     Performed at [facility]   Culture     Final   Value: NO GROWTH 5 DAYS     Performed at [facility]   Report Status 03/22/2013 FINAL   Final  CULTURE, BLOOD (ROUTINE X 2)     Status: None   Collection Time    03/16/13  6:33 AM      Result Value Range Status   Specimen Description BLOOD LEFT HAND   Final   Special Requests BOTTLES DRAWN AEROBIC ONLY 8CC   Final   Culture  Setup Time     Final   Value: 03/16/2013 11:09     Performed at [facility]   Culture     Final   Value: METHICILLIN RESISTANT STAPHYLOCOCCUS AUREUS     Note: RIFAMPIN AND GENTAMICIN SHOULD  NOT BE USED AS SINGLE DRUGS FOR TREATMENT OF STAPH INFECTIONS. CRITICAL RESULT CALLED TO, READ BACK BY AND VERIFIED WITH: RACHEL LESPERANCE 03/18/13 1345 BY SMITHERSJ     Note: Gram Stain Report Called to,Read Back By and Verified With: DEANNA DILLON ON 03/18/2013 AT 12:30A BY WILEJ     Performed at [facility]   Report Status 03/19/2013 FINAL   Final   Organism ID, Bacteria METHICILLIN RESISTANT STAPHYLOCOCCUS AUREUS   Final  CULTURE, BLOOD (ROUTINE X 2)     Status: None   Collection Time    03/18/13  4:30 PM      Result Value Range Status   Specimen Description BLOOD RIGHT HAND   Final   Special Requests BOTTLES DRAWN AEROBIC ONLY 10CC   Final   Culture  Setup Time     Final   Value: 03/18/2013 22:25     Performed at [facility]   Culture     Final   Value: NO GROWTH 5 DAYS     Performed at [facility]   Report Status 03/24/2013 FINAL   Final  CULTURE, BLOOD (ROUTINE X 2)     Status: None   Collection Time    03/18/13  4:45 PM      Result Value Range Status   Specimen  Description BLOOD LEFT ARM   Final   Special Requests BOTTLES DRAWN AEROBIC ONLY 10CC   Final   Culture  Setup Time     Final   Value: 03/18/2013 22:26     Performed at [facility]   Culture     Final   Value: NO GROWTH 5 DAYS     Performed at [facility]   Report Status 03/24/2013 FINAL   Final  URINE CULTURE     Status: None   Collection Time    03/21/13 10:11 AM      Result Value Range Status   Specimen Description URINE, RANDOM   Final   Special Requests NONE   Final   Culture  Setup Time     Final   Value: 03/21/2013 11:01     Performed at [facility] Count     Final   Value: NO GROWTH     Performed at [facility]   Culture     Final   Value: NO GROWTH     Performed at [facility]   Report Status 03/22/2013 FINAL   Final  URINE CULTURE     Status: None   Collection Time    03/27/13  1:53 PM      Result Value Range Status   Specimen Description URINE, CLEAN CATCH   Final   Special Requests NONE   Final   Culture  Setup Time     Final   Value: 03/27/2013 21:50     Performed at [facility] Count     Final   Value: 15,000 COLONIES/ML     Performed at [facility]   Culture     Final   Value: YEAST     Performed at [facility]   Report Status 03/28/2013 FINAL   Final  CULTURE, BLOOD (ROUTINE X 2)     Status: None   Collection Time    03/27/13  1:55 PM      Result Value Range Status   Specimen Description BLOOD PICC LINE   Final   Special Requests  BOTTLES DRAWN AEROBIC AND ANAEROBIC 10CC   Final   Culture  Setup Time     Final   Value: 03/27/2013 21:48     Performed at [facility]   Culture     Final   Value:        BLOOD CULTURE RECEIVED NO GROWTH TO DATE CULTURE WILL BE HELD FOR 5 DAYS BEFORE ISSUING A FINAL NEGATIVE REPORT     Performed at [facility]   Report Status PENDING   Incomplete  CULTURE, BLOOD (ROUTINE X 2)     Status: None   Collection Time     03/27/13  3:25 PM      Result Value Range Status   Specimen Description BLOOD RIGHT HAND   Final   Special Requests     Final   Value: BOTTLES DRAWN AEROBIC AND ANAEROBIC 10CC BLUE,5CC RED   Culture  Setup Time     Final   Value: 03/27/2013 21:48     Performed at [facility]   Culture     Final   Value:        BLOOD CULTURE RECEIVED NO GROWTH TO DATE CULTURE WILL BE HELD FOR 5 DAYS BEFORE ISSUING A FINAL NEGATIVE REPORT     Performed at [facility]   Report Status PENDING   Incomplete  MRSA PCR SCREENING     Status: None   Collection Time    03/27/13  7:17 PM      Result Value Range Status   MRSA by PCR NEGATIVE  NEGATIVE Final   Comment:            The GeneXpert MRSA Assay (FDA     approved for NASAL specimens     only), is one component of a     comprehensive MRSA colonization     surveillance program. It is not     intended to diagnose MRSA     infection nor to guide or     monitor treatment for     MRSA infections.  CULTURE, ROUTINE-ABSCESS     Status: None   Collection Time    03/28/13  3:27 PM      Result Value Range Status   Specimen Description ABSCESS BUTTOCKS RIGHT   Final   Special Requests NONE   Final   Gram Stain     Final   Value: MODERATE WBC PRESENT,BOTH PMN AND MONONUCLEAR     NO SQUAMOUS EPITHELIAL CELLS SEEN     ABUNDANT GRAM NEGATIVE RODS     Performed at [facility]   Culture     Final   Value: NO GROWTH 1 DAY     Performed at [facility]   Report Status PENDING   Incomplete  ANAEROBIC CULTURE     Status: None   Collection Time    03/28/13  3:27 PM      Result Value Range Status   Specimen Description ABSCESS BUTTOCKS RIGHT   Final   Special Requests NONE   Final   Gram Stain PENDING   Incomplete   Culture     Final   Value: NO ANAEROBES ISOLATED; CULTURE IN PROGRESS FOR 5 DAYS     Performed at [facility]   Report Status PENDING   Incomplete    Anti-infectives   Start     Dose/Rate Route  Frequency Ordered Stop   03/27/13 2200  piperacillin-tazobactam (ZOSYN) IVPB 3.375 g  3.375 g 12.5 mL/hr over 240 Minutes Intravenous 3 times per day 03/27/13 1530     03/27/13 2200  hydroxychloroquine (PLAQUENIL) tablet 200 mg     200 mg Oral 2 times daily 03/27/13 1915     03/27/13 1430  piperacillin-tazobactam (ZOSYN) IVPB 3.375 g     3.375 g 100 mL/hr over 30 Minutes Intravenous  Once 03/27/13 1415 03/27/13 1514   03/27/13 0300  vancomycin (VANCOCIN) 500 mg in sodium chloride 0.9 % 100 mL IVPB     500 mg 100 mL/hr over 60 Minutes Intravenous Every 12 hours 03/27/13 2351     03/27/13 0100  vancomycin (VANCOCIN) 500 mg in sodium chloride 0.9 % 100 mL IVPB  Status:  Discontinued     500 mg 100 mL/hr over 60 Minutes Intravenous Every 12 hours 03/27/13 2227 03/27/13 2340      Assessment: 62 YOF who recently underwent laparotomy for adhesions and was discharged on IV vancomycin for MRSA bacteremia. Now readmitted with intra-abdominal abscess and on vancomycin and Zosyn. A vancomycin trough drawn today was therapeutic at 15.52mcg/mL after she had a SUPRAtherapeutic trough on 11/2. Renal function has remained stable. Abscess is currently growing gram negative rods.  Goal of Therapy:  Vancomycin trough level 15-20 mcg/ml  Plan:  1. Continue vancomycin 500mg  IV q12h 2. Continue Zosyn 3.375gm IV q8h EI 3. Will follow renal function, LOT, ability to de-escalate, and clinical progression  Chevon Laufer D. Adalaya Irion, PharmD Clinical Pharmacist Pager: [phone] 03/30/2013 4:15 PM

## 2013-03-30 NOTE — Progress Notes (Signed)
I have seen and examined the patient and agree with the assessment and plans. Transfer to floor today  Mckenna Gamm A. Magnus Ivan  MD, FACS

## 2013-03-30 NOTE — Progress Notes (Addendum)
[facility]     Subjective: C./o abdominal and back pain   Antibiotics:  Anti-infectives   Start     Dose/Rate Route Frequency Ordered Stop   03/27/13 2200  piperacillin-tazobactam (ZOSYN) IVPB 3.375 g     3.375 g 12.5 mL/hr over 240 Minutes Intravenous 3 times per day 03/27/13 1530     03/27/13 2200  hydroxychloroquine (PLAQUENIL) tablet 200 mg     200 mg Oral 2 times daily 03/27/13 1915     03/27/13 1430  piperacillin-tazobactam (ZOSYN) IVPB 3.375 g     3.375 g 100 mL/hr over 30 Minutes Intravenous  Once 03/27/13 1415 03/27/13 1514   03/27/13 0300  vancomycin (VANCOCIN) 500 mg in sodium chloride 0.9 % 100 mL IVPB     500 mg 100 mL/hr over 60 Minutes Intravenous Every 12 hours 03/27/13 2351     03/27/13 0100  vancomycin (VANCOCIN) 500 mg in sodium chloride 0.9 % 100 mL IVPB  Status:  Discontinued     500 mg 100 mL/hr over 60 Minutes Intravenous Every 12 hours 03/27/13 2227 03/27/13 2340      Medications: Scheduled Meds: . arformoterol  15 mcg Nebulization BID  . budesonide  0.25 mg Nebulization BID  . citalopram  20 mg Oral Daily  . [START ON 03/31/2013] diltiazem  30 mg Oral Q8H  . enoxaparin (LOVENOX) injection  40 mg Subcutaneous Q24H  . famotidine  20 mg Oral BID  . fluticasone  2 spray Each Nare Daily  . guaiFENesin  1,200 mg Oral BID  . hydroxychloroquine  200 mg Oral BID  . insulin aspart  0-9 Units Subcutaneous TID WC  . piperacillin-tazobactam (ZOSYN)  IV  3.375 g Intravenous Q8H  . potassium chloride  40 mEq Oral BID  . sodium chloride  3 mL Intravenous Q12H  . tiotropium  18 mcg Inhalation Daily  . vancomycin  500 mg Intravenous Q12H   Continuous Infusions: . sodium chloride 150 mL/hr at 03/29/13 2110   PRN Meds:.acetaminophen, iohexol, LORazepam, morphine injection, sodium chloride   Objective: Weight change:   Intake/Output Summary (Last 24 hours) at 03/30/13 1800 Last data filed at 03/30/13 1700  Gross per 24  hour  Intake      0 ml  Output    800 ml  Net   -800 ml   Blood pressure 115/51, pulse 90, temperature 100 F (37.8 C), temperature source Oral, resp. rate 19, height 5\' 1"  (1.549 m), weight 126 lb 12.2 oz (57.5 kg), SpO2 100.00%. Temp:  [98.4 F (36.9 C)-100 F (37.8 C)] 100 F (37.8 C) (11/05 1701) Pulse Rate:  [85-92] 90 (11/05 1701) Resp:  [12-19] 19 (11/05 1701) BP: (110-121)/(51-72) 115/51 mmHg (11/05 1701) SpO2:  [97 %-100 %] 100 % (11/05 1701) FiO2 (%):  [100 %] 100 % (11/05 1016) Weight:  [126 lb 12.2 oz (57.5 kg)] 126 lb 12.2 oz (57.5 kg) (11/05 0442)  Physical Exam: General: Alert and awake, oriented x3, not in any acute distress.  HEENT: anicteric sclera, pupils reactive to light and accommodation, EOMI, oropharynx clear and without exudate  CVS regular rate, normal r, no murmur rubs or gallops  Chest: clear to auscultation bilaterally, no wheezing, rales or rhonchi  Abdomen: dressings in place, JP drain with hazy material in place Extremities: no clubbing or edema noted bilaterally  Skin: no rashes  Neuro: nonfocal,  Lab Results:  Recent Labs  03/29/13 0445 03/30/13 0530  WBC 14.7* 11.2*  HGB 9.2*  9.0*  HCT 26.6* 26.7*  PLT 324 363    BMET  Recent Labs  03/29/13 0445 03/30/13 0530  NA 142 142  K 3.9 3.3*  CL 109 112  CO2 23 23  GLUCOSE 80 91  BUN 7 5*  CREATININE 1.03 0.93  CALCIUM 7.4* 7.5*    Micro Results: Recent Results (from the past 240 hour(s))  URINE CULTURE     Status: None   Collection Time    03/21/13 10:11 AM      Result Value Range Status   Specimen Description URINE, RANDOM   Final   Special Requests NONE   Final   Culture  Setup Time     Final   Value: 03/21/2013 11:01     Performed at [facility] Count     Final   Value: NO GROWTH     Performed at [facility]   Culture     Final   Value: NO GROWTH     Performed at [facility]   Report Status 03/22/2013 FINAL   Final  URINE CULTURE      Status: None   Collection Time    03/27/13  1:53 PM      Result Value Range Status   Specimen Description URINE, CLEAN CATCH   Final   Special Requests NONE   Final   Culture  Setup Time     Final   Value: 03/27/2013 21:50     Performed at [facility] Count     Final   Value: 15,000 COLONIES/ML     Performed at [facility]   Culture     Final   Value: YEAST     Performed at [facility]   Report Status 03/28/2013 FINAL   Final  CULTURE, BLOOD (ROUTINE X 2)     Status: None   Collection Time    03/27/13  1:55 PM      Result Value Range Status   Specimen Description BLOOD PICC LINE   Final   Special Requests BOTTLES DRAWN AEROBIC AND ANAEROBIC 10CC   Final   Culture  Setup Time     Final   Value: 03/27/2013 21:48     Performed at [facility]   Culture     Final   Value:        BLOOD CULTURE RECEIVED NO GROWTH TO DATE CULTURE WILL BE HELD FOR 5 DAYS BEFORE ISSUING A FINAL NEGATIVE REPORT     Performed at [facility]   Report Status PENDING   Incomplete  CULTURE, BLOOD (ROUTINE X 2)     Status: None   Collection Time    03/27/13  3:25 PM      Result Value Range Status   Specimen Description BLOOD RIGHT HAND   Final   Special Requests     Final   Value: BOTTLES DRAWN AEROBIC AND ANAEROBIC 10CC BLUE,5CC RED   Culture  Setup Time     Final   Value: 03/27/2013 21:48     Performed at [facility]   Culture     Final   Value:        BLOOD CULTURE RECEIVED NO GROWTH TO DATE CULTURE WILL BE HELD FOR 5 DAYS BEFORE ISSUING A FINAL NEGATIVE REPORT     Performed at [facility]   Report Status PENDING   Incomplete  MRSA PCR SCREENING  Status: None   Collection Time    03/27/13  7:17 PM      Result Value Range Status   MRSA by PCR NEGATIVE  NEGATIVE Final   Comment:            The GeneXpert MRSA Assay (FDA     approved for NASAL specimens     only), is one component of a     comprehensive MRSA colonization       surveillance program. It is not     intended to diagnose MRSA     infection nor to guide or     monitor treatment for     MRSA infections.  CULTURE, ROUTINE-ABSCESS     Status: None   Collection Time    03/28/13  3:27 PM      Result Value Range Status   Specimen Description ABSCESS BUTTOCKS RIGHT   Final   Special Requests NONE   Final   Gram Stain     Final   Value: MODERATE WBC PRESENT,BOTH PMN AND MONONUCLEAR     NO SQUAMOUS EPITHELIAL CELLS SEEN     ABUNDANT GRAM NEGATIVE RODS     Performed at [facility]   Culture     Final   Value: NO GROWTH 1 DAY     Performed at [facility]   Report Status PENDING   Incomplete  ANAEROBIC CULTURE     Status: None   Collection Time    03/28/13  3:27 PM      Result Value Range Status   Specimen Description ABSCESS BUTTOCKS RIGHT   Final   Special Requests NONE   Final   Gram Stain PENDING   Incomplete   Culture     Final   Value: NO ANAEROBES ISOLATED; CULTURE IN PROGRESS FOR 5 DAYS     Performed at [facility]   Report Status PENDING   Incomplete    Studies/Results: No results found.    Assessment/Plan: [name] is a 62 y.o. female with   Crohn's disease diabetes mellitus recently discharged from the hospital after undergoing laparotomy for adhesions on 10/22. Post op she was found to have MRSA Bacteremia in one set of blood cultures. TTE and TEE did not show any vegetations. She was discharged on IV vancomycin, now readmitted with intra-abdominal abscess currently on vancomycin and Zosyn with plans for IR drainage yesterday with GNR growing on culture  #1 Intrabdominal abscess:  - zosyn reasonable for now , will likely change zosyn to meropenem which could be dosed q 8 hours in SNF --would make sure she gets at least 14 days of therapy with repeat imaging of abdomen in the interval   #2 MRSA Bacteremia: no vegetations on TEE  --will make sure that she gets IV vancomycin thru 04/01/13 and  then will stop this. She had clean TEE  #3 Screening:  HIV, hep panel negative    LOS: 3 days   Acey Lav 03/30/2013, 6:00 PM

## 2013-03-30 NOTE — Progress Notes (Signed)
[facility] Consult F/U Note [facility] TEAM 1 - Stepdown ICU Team   [name]:[id number] DOB: 23-Oct-1950 DOA: 03/27/2013 PCP: [name], MD  Brief narrative: 62 y.o. female recently discharged from the hospital after undergoing laparotomy for adhesions on 10/22. Post op she was found to have MRSA Bacteremia in one set of blood cultures. TTE and TEE did not show any vegetations. She was discharged on IV vancomycin to complete a 14 day course. She got a PICC line before discharge,. She returned to the ER w/ c/o fever chills, mild abdominal discomfort and nausea, no vomiting. On arrival to ED, she was found to be febrile , hypotensive, tachycardic. Labs revealed leukocytosis.  A CXR was negative for pneumonia and UA pending.   Assessment/Plan:    Sepsis -due to pelvic abscesses -treat underlying causes and provide supportive care -PCT at admit 168 and has decreased to 124 with empiric anbxs   Hypotension -combo volume depletion and pain meds -will adjust CCB to short acting with hold parameters to begin in am since received dose this am -cont IVFs    Anemia (symptomatic) -likely due to recent surgery in combination with smoldering infection/sepsis -transfused 2 units PRBCs due to soft BP readings 11/3 -Hgb up from 6.4 to 9.2 on 11/4 -baseline Hgb ~ 10-11    Pelvic abscess (post op) -post perc drain per IR 11/3 -cx's pending: abundant GNRs - ID following      DIABETES, TYPE 2 -CBGs stable    MULTIFOCAL ATRIAL TACHYCARDIA -recent admit for same -HR currently controlled -due to soft BP held BB/CCB but will resume low dose CCB 11/4    Chronic hypoxic respiratory failure/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  Objective: Blood pressure 118/62, pulse 91, temperature 98.4 F (36.9 C), temperature source Oral, resp. rate 13, height 5\' 1"  (1.549 m), weight 126 lb 12.2 oz (57.5 kg), SpO2 99.00%.  Intake/Output Summary (Last 24 hours) at 03/30/13 1220 Last data filed at 03/29/13 1730  Gross per 24 hour  Intake   1860 ml  Output    255 ml  Net   1605 ml   Exam: General: No acute respiratory distress Lungs: Clear to auscultation bilaterally without wheezes or crackles, 2L Cardiovascular: Regular rate and rhythm without murmur gallop or rub normal S1 and S2, no peripheral edema or JVD Abdomen: Somewhat tender primarily suprapubic region, nondistended, soft, bowel sounds positive, no rebound, no ascites, no appreciable mass Musculoskeletal: No significant cyanosis, clubbing of bilateral lower extremities   Scheduled Meds:  Scheduled Meds: . arformoterol  15 mcg Nebulization BID  . budesonide  0.25 mg Nebulization BID  . citalopram  20 mg Oral Daily  . diltiazem  120 mg Oral Daily  . enoxaparin (LOVENOX) injection  40 mg Subcutaneous Q24H  . famotidine  20 mg Oral BID  . fluticasone  2 spray Each Nare Daily  . hydroxychloroquine  200 mg Oral BID  . insulin aspart  0-9 Units Subcutaneous TID WC  . piperacillin-tazobactam (ZOSYN)  IV  3.375 g Intravenous Q8H  . potassium chloride  40 mEq Oral BID  .  sodium chloride  3 mL Intravenous Q12H  . tiotropium  18 mcg Inhalation Daily  . vancomycin  500 mg Intravenous Q12H    Data Reviewed: Basic Metabolic Panel:  Recent Labs Lab 03/27/13 1333 03/27/13 1550 03/27/13 1559 03/28/13 0500 03/29/13 0445 03/30/13 0530  NA 137  --  141 139 142 142  K 3.2*  --  3.0* 3.4* 3.9 3.3*  CL 100  --  100 106 109 112  CO2 27  --   --  24 23 23   GLUCOSE 87  --  80 110* 80 91  BUN 8  --  6 6 7  5*    CREATININE 1.01  --  1.20* 1.00 1.03 0.93  CALCIUM 7.9*  --   --  7.6* 7.4* 7.5*  MG  --  1.6  --   --   --   --    Liver Function Tests:  Recent Labs Lab 03/27/13 1333  AST 30  ALT 23  ALKPHOS 132*  BILITOT 0.4  PROT 5.7*  ALBUMIN 2.1*   CBC:  Recent Labs Lab 03/27/13 1333 03/27/13 1559 03/28/13 0500 03/29/13 0445 03/30/13 0530  WBC 20.7*  --  13.6* 14.7* 11.2*  NEUTROABS 18.2*  --   --   --   --   HGB 7.6* 7.5* 6.4* 9.2* 9.0*  HCT 22.6* 22.0* 19.2* 26.6* 26.7*  MCV 84.6  --  83.8 82.1 82.9  PLT 334  --  366 324 363   CBG:  Recent Labs Lab 03/28/13 2055 03/29/13 0049 03/29/13 0752 03/29/13 1215 03/30/13 0823  GLUCAP 90 93 71 109* 85    Recent Results (from the past 240 hour(s))  URINE CULTURE     Status: None   Collection Time    03/21/13 10:11 AM      Result Value Range Status   Specimen Description URINE, RANDOM   Final   Special Requests NONE   Final   Culture  Setup Time     Final   Value: 03/21/2013 11:01     Performed at [facility] Count     Final   Value: NO GROWTH     Performed at [facility]   Culture     Final   Value: NO GROWTH     Performed at [facility]   Report Status 03/22/2013 FINAL   Final  URINE CULTURE     Status: None   Collection Time    03/27/13  1:53 PM      Result Value Range Status   Specimen Description URINE, CLEAN CATCH   Final   Special Requests NONE   Final   Culture  Setup Time     Final   Value: 03/27/2013 21:50     Performed at [facility] Count     Final   Value: 15,000 COLONIES/ML     Performed at [facility]   Culture     Final   Value: YEAST     Performed at [facility]   Report Status 03/28/2013 FINAL   Final  CULTURE, BLOOD (ROUTINE X 2)     Status: None   Collection Time    03/27/13  1:55 PM      Result Value Range Status   Specimen Description BLOOD PICC LINE   Final   Special Requests BOTTLES DRAWN AEROBIC AND  ANAEROBIC 10CC   Final   Culture  Setup Time  Final   Value: 03/27/2013 21:48     Performed at [facility]   Culture     Final   Value:        BLOOD CULTURE RECEIVED NO GROWTH TO DATE CULTURE WILL BE HELD FOR 5 DAYS BEFORE ISSUING A FINAL NEGATIVE REPORT     Performed at [facility]   Report Status PENDING   Incomplete  CULTURE, BLOOD (ROUTINE X 2)     Status: None   Collection Time    03/27/13  3:25 PM      Result Value Range Status   Specimen Description BLOOD RIGHT HAND   Final   Special Requests     Final   Value: BOTTLES DRAWN AEROBIC AND ANAEROBIC 10CC BLUE,5CC RED   Culture  Setup Time     Final   Value: 03/27/2013 21:48     Performed at [facility]   Culture     Final   Value:        BLOOD CULTURE RECEIVED NO GROWTH TO DATE CULTURE WILL BE HELD FOR 5 DAYS BEFORE ISSUING A FINAL NEGATIVE REPORT     Performed at [facility]   Report Status PENDING   Incomplete  MRSA PCR SCREENING     Status: None   Collection Time    03/27/13  7:17 PM      Result Value Range Status   MRSA by PCR NEGATIVE  NEGATIVE Final   Comment:            The GeneXpert MRSA Assay (FDA     approved for NASAL specimens     only), is one component of a     comprehensive MRSA colonization     surveillance program. It is not     intended to diagnose MRSA     infection nor to guide or     monitor treatment for     MRSA infections.  CULTURE, ROUTINE-ABSCESS     Status: None   Collection Time    03/28/13  3:27 PM      Result Value Range Status   Specimen Description ABSCESS BUTTOCKS RIGHT   Final   Special Requests NONE   Final   Gram Stain     Final   Value: MODERATE WBC PRESENT,BOTH PMN AND MONONUCLEAR     NO SQUAMOUS EPITHELIAL CELLS SEEN     ABUNDANT GRAM NEGATIVE RODS     Performed at [facility]   Culture     Final   Value: NO GROWTH 1 DAY     Performed at [facility]   Report Status PENDING   Incomplete  ANAEROBIC CULTURE     Status:  None   Collection Time    03/28/13  3:27 PM      Result Value Range Status   Specimen Description ABSCESS BUTTOCKS RIGHT   Final   Special Requests NONE   Final   Gram Stain PENDING   Incomplete   Culture     Final   Value: NO ANAEROBES ISOLATED; CULTURE IN PROGRESS FOR 5 DAYS     Performed at [facility]   Report Status PENDING   Incomplete     Studies:  Recent x-ray studies have been reviewed in detail by the Attending Physician     Junious Silk, ANP [facility] Office  [phone] Pager [phone]  **If unable to reach the above provider after paging please contact the Flow Manager @ [phone]  On-Call/Text Page:      [email or web address]      password [email or web address]  If 7PM-7AM, please contact night-coverage [email or web address] Password [email or web address] 03/30/2013, 12:20 PM   LOS: 3 days   I have personally examined this patient and reviewed the entire database. I have reviewed the above note, made any necessary editorial changes, and agree with its content.  Lonia Blood, MD [facility]

## 2013-03-30 NOTE — Progress Notes (Signed)
Pt unable to do flutter adequately due to stomach pain, has used flutter before. She will try again later.

## 2013-03-30 NOTE — Consult Note (Signed)
PULMONARY  / CRITICAL CARE MEDICINE  Name: [name] MRN: [id number] DOB: 12/26/1950    ADMISSION DATE:  03/27/2013 CONSULTATION DATE:  03/30/2013   REFERRING MD :  [facility] PRIMARY SERVICE:  [facility]  CHIEF COMPLAINT:  abd pain  BRIEF PATIENT DESCRIPTION:  62 y.o.AAF well known to me, Gold D Copd /AB oxygen dependent.  S/p exlap for adhesions/ventral hernia in October 2014, assoc MRSA bacteremia without SBE. Returned 03/27/13 with pelvic abscess with GNR on IR drain.  Pt improved now. Family notified me of readmit.  [facility] providing excellent medical care. Pt states she does have some productive cough, some dyspnea is noted but is stable. Pain in abd is less. Pt denies any significant sore throat, nasal congestion or excess secretions, fever, chills, sweats, unintended weight loss, pleurtic or exertional chest pain, orthopnea PND, or leg swelling Pt denies any increase in rescue therapy over baseline, denies waking up needing it or having any early am or nocturnal exacerbations of coughing/wheezing/or dyspnea. Pt also denies any obvious fluctuation in symptoms with  weather or environmental change or other alleviating or aggravating factors    SIGNIFICANT EVENTS / STUDIES:  IR abscess drain 11/3 pelvis   LINES / TUBES: PICC RUE 10/28 IR pelvis drain 11/3   CULTURES: 11/2 UC >>15K yeast 11/2 BCx 2>>NGTD 11/3 pelvis drainage>>GNR    ANTIBIOTICS: Zosyn 11/3>> Vanco 02/2013>>    PAST MEDICAL HISTORY :  Past Medical History  Diagnosis Date  . Other diseases of vocal cords   . Osteoporosis, unspecified   . Other and unspecified hyperlipidemia   . Esophageal reflux   . Chronic airway obstruction, not elsewhere classified   . Regional enteritis of unspecified site   . 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 Type II or unspecified type diabetes mellitus without mention of complication, not stated as uncontrolled   . Crohn's disease     Ileostomy  . Pelvic  adhesions   . Anxiety   . Depression   . Hypertension   . Shortness of breath   . Pneumonia   . Asthma   . Emphysema   . Hematuria   . Rheumatoid arthritis(714.0) 03/2012    [facility]  . Severe cervical dysplasia age 45    cone biopsy with neg paps since   Past Surgical History  Procedure Laterality Date  . Ileostomy    . Vaginal hysterectomy  1993    [facility]  . Ear cyst excision    . Cervical cone biopsy  1978  . Colon surgery  2007    subtotal colectomy with ileostomy  . Esophagus stretch    . Oophorectomy      LSO  . Tracheostomy  2010  . Hammer toe surgery  1980's    Bilateral Feet  . Balloon dilation  02/10/2012    Procedure: BALLOON DILATION;  Surgeon: Charolett Bumpers, MD;  Location: [facility] ENDOSCOPY;  Service: Endoscopy;  Laterality: N/A;  . Colposcopy    . Flexible sigmoidoscopy  03/26/2012    Procedure: FLEXIBLE SIGMOIDOSCOPY;  Surgeon: Charolett Bumpers, MD;  Location: [facility] ENDOSCOPY;  Service: Endoscopy;  Laterality: N/A;  . Cardiac catheterization  07/08/2007    normal  . Nm myocar perf wall motion  09/21/2006    no ischemia  . Ventral hernia repair N/A 03/16/2013    Procedure: REPAIR OF INCISIONAL AND PARASTOMAL HERNIAS;  Surgeon: Mariella Saa, MD;  Location: [facility] OR;  Service: General;  Laterality: N/A;  . Laparotomy N/A 03/16/2013  Procedure: EXPLORATORY LAPAROTOMY;  Surgeon: Mariella Saa, MD;  Location[facility];  Service: General;  Laterality: N/A;  . Lysis of adhesion N/A 03/16/2013    Procedure: LYSIS OF ADHESIONS FOR SMALL BOWEL OBSTRUCTION;  Surgeon: Mariella Saa, MD;  Location: [facility] OR;  Service: General;  Laterality: N/A;  . Insertion of mesh N/A 03/16/2013    Procedure: INSERTION OF BIOLOGIC MESH;  Surgeon: Mariella Saa, MD;  Location: [facility] OR;  Service: General;  Laterality: N/A;  . Tee without cardioversion N/A 03/21/2013    Procedure: TRANSESOPHAGEAL ECHOCARDIOGRAM (TEE);  Surgeon: Vesta Mixer, MD;  Location: [facility] ENDOSCOPY;   Service: Cardiovascular;  Laterality: N/A;   Prior to Admission medications   Medication Sig Start Date End Date Taking? Authorizing Provider  arformoterol (BROVANA) 15 MCG/2ML NEBU Take 15 mcg by nebulization 2 (two) times daily.   Yes Historical Provider, MD  aspirin 81 MG tablet Take 81 mg by mouth 3 (three) times a week. Monday, Wednesday, and Friday   Yes Historical Provider, MD  budesonide (PULMICORT) 0.25 MG/2ML nebulizer solution Take 0.25 mg by nebulization 2 (two) times daily.   Yes Historical Provider, MD  celecoxib (CELEBREX) 200 MG capsule Take 200 mg by mouth daily.   Yes Historical Provider, MD  Cholecalciferol (VITAMIN D) 1000 UNITS capsule Take 1,000 Units by mouth daily.    Yes Historical Provider, MD  citalopram (CELEXA) 20 MG tablet Take 20 mg by mouth daily.    Yes Historical Provider, MD  diltiazem (CARDIZEM CD) 180 MG 24 hr capsule Take 180 mg by mouth 2 (two) times daily.   Yes Historical Provider, MD  famotidine (PEPCID) 20 MG tablet Take 20 mg by mouth 2 (two) times daily.   Yes Historical Provider, MD  fluticasone (FLONASE) 50 MCG/ACT nasal spray Place 2 sprays into the nose daily.   Yes Historical Provider, MD  furosemide (LASIX) 20 MG tablet Take 20 mg by mouth 2 (two) times daily. Once or twice daily as needed for swelling   Yes Historical Provider, MD  guaiFENesin (MUCINEX) 600 MG 12 hr tablet Take 1,200 mg by mouth 2 (two) times daily.    Yes Historical Provider, MD  HYDROcodone-acetaminophen (NORCO/VICODIN) 5-325 MG per tablet Take 2 tablets by mouth every 6 (six) hours as needed for pain.   Yes Historical Provider, MD  hydroxychloroquine (PLAQUENIL) 200 MG tablet Take 1 tablet by mouth Twice daily. 04/20/12  Yes Historical Provider, MD  IRON PO Take 1 tablet by mouth every Monday, Wednesday, and Friday.    Yes Historical Provider, MD  Liraglutide (VICTOZA) 18 MG/3ML SOLN Inject 1.8 mg into the skin daily. 1.8 units once daily   Yes Historical Provider, MD   LORazepam (ATIVAN) 0.5 MG tablet Take 0.5 mg by mouth every 8 (eight) hours as needed for anxiety.   Yes Historical Provider, MD  metoprolol tartrate (LOPRESSOR) 25 MG tablet Take 12.5 mg by mouth 2 (two) times daily.   Yes Historical Provider, MD  Multiple Vitamin (MULTIVITAMIN WITH MINERALS) TABS Take 1 tablet by mouth daily.   Yes Historical Provider, MD  omeprazole (PRILOSEC) 40 MG capsule Take 40 mg by mouth 2 (two) times daily.    Yes Historical Provider, MD  rosuvastatin (CRESTOR) 5 MG tablet Take 5 mg by mouth every Monday, Wednesday, and Friday.    Yes Historical Provider, MD  tiotropium (SPIRIVA) 18 MCG inhalation capsule Place 18 mcg into inhaler and inhale daily.   Yes Historical Provider, MD  Vancomycin (VANCOCIN) 750  MG/150ML SOLN Inject 750 mg into the vein every 12 (twelve) hours. Continue for 9 more days from 03/22/2013   Yes Historical Provider, MD   Allergies  Allergen Reactions  . Esomeprazole Magnesium Other (See Comments)    NEXIUM - reaction > aggravated pt's Crohn's disease  . Other     Beans, Dander/Dust, Peas, Mushrooms  . Peanut-Containing Drug Products Other (See Comments)    unknown  . Shellfish Allergy Other (See Comments)    unknown  . Surgical Lubricant Other (See Comments)    Burns skin     FAMILY HISTORY:  Family History  Problem Relation Age of Onset  . Osteoporosis Mother   . Kidney disease Mother   . Hypertension Sister   . Stroke Sister   . Diabetes Sister    SOCIAL HISTORY:  reports that she quit smoking about 9 years ago. She has never used smokeless tobacco. She reports that she drinks alcohol. She reports that she does not use illicit drugs.  REVIEW OF SYSTEMS:   Pos in BOLD Constitutional: fever, chills, weight loss, malaise/fatigue and diaphoresis.  HENT: Negative for hearing loss, ear pain, nosebleeds, congestion, sore throat, neck pain, tinnitus and ear discharge.   Eyes: Negative for blurred vision, double vision, photophobia,  pain, discharge and redness.  Respiratory:cough, hemoptysis, sputum production, shortness of breath, wheezing and stridor.   Cardiovascular: Negative for chest pain, palpitations, orthopnea, claudication, leg swelling and PND.  Gastrointestinal: Negative for heartburn, nausea, vomiting, abdominal pain, diarrhea, constipation, blood in stool and melena.  Genitourinary: Negative for dysuria, urgency, frequency, hematuria and flank pain.  Musculoskeletal: Negative for myalgias, back pain, joint pain and falls.  Skin: Negative for itching and rash.  Neurological: Negative for dizziness, tingling, tremors, sensory change, speech change, focal weakness, seizures, loss of consciousness, weakness and headaches.  Endo/Heme/Allergies: Negative for environmental allergies and polydipsia. Does not bruise/bleed easily.  SUBJECTIVE:  As above VITAL SIGNS: Temp:  [98.1 F (36.7 C)-99.4 F (37.4 C)] 98.9 F (37.2 C) (11/05 0442) Pulse Rate:  [88-95] 88 (11/05 0442) Resp:  [12-19] 19 (11/05 0442) BP: (102-121)/(57-67) 111/65 mmHg (11/05 0442) SpO2:  [100 %] 100 % (11/05 0442) Weight:  [57.5 kg (126 lb 12.2 oz)] 57.5 kg (126 lb 12.2 oz) (11/05 0442)  PHYSICAL EXAMINATION: General:  Ill appearing in NAD Neuro:  Intact, non focal HEENT:  Moist mucus membranes Neck:  supple Cardiovascular:  RRR nl s1 s2 no s3 s4 Lungs:  Distant BS, no rhonchi Abdomen:  Tender but less so, BSA, drain and ostomy in place Musculoskeletal:  from Skin:  clear   Recent Labs Lab 03/28/13 0500 03/29/13 0445 03/30/13 0530  NA 139 142 142  K 3.4* 3.9 3.3*  CL 106 109 112  CO2 24 23 23   BUN 6 7 5*  CREATININE 1.00 1.03 0.93  GLUCOSE 110* 80 91    Recent Labs Lab 03/28/13 0500 03/29/13 0445 03/30/13 0530  HGB 6.4* 9.2* 9.0*  HCT 19.2* 26.6* 26.7*  WBC 13.6* 14.7* 11.2*  PLT 366 324 363   Ct Image Guided Drainage Percut Cath  Peritoneal Retroperit  03/28/2013   CLINICAL DATA:  POSTOP pelvic ABSCESS,  ELEVATED WHITE COUNT, FEVER  EXAM: CT-GUIDED TRANS GLUTEAL PELVIC ABSCESS DRAIN INSERTION  Date:  11/3/201411/07/2012 3:32 PM  Radiologist:  Judie Petit. Ruel Favors, MD  Guidance:  CT  MEDICATIONS AND MEDICAL HISTORY: 4 MG VERSED, 150 MCG FENTANYL  ANESTHESIA/SEDATION: 35 MIN  CONTRAST:  NONE.  FLUOROSCOPY TIME:  NONE.  PROCEDURE: Informed  consent was obtained from the patient following explanation of the procedure, risks, benefits and alternatives. The patient understands, agrees and consents for the procedure. All questions were addressed. A time out was performed.  Maximal barrier sterile technique utilized including caps, mask, sterile gowns, sterile gloves, large sterile drape, hand hygiene, and BETADINE.  Previous imaging reviewed. Patient was positioned prone. Noncontrast localization CT performed. The pelvic abscess was localized. Under sterile conditions and local anesthesia, an 18 gauge 10 cm access needle was advanced from a right trans gluteal approach into the collection. There was return of purulent fluid. Sample sent for Gram stain culture. Guidewire inserted followed by tract dilatation to insert 10 [place] drain. Catheter position confirmed with CT. Syringe aspiration yielded 155 cc purulent fluid. Catheter is secured with a Prolene suture and connected to external suction bulb. Sterile dressing applied. No immediate complication.  COMPLICATIONS: NO IMMEDIATE  IMPRESSION: Successful CT-guided right trans gluteal pelvic abscess drain insertion   Electronically Signed   By: Ruel Favors M.D.   On: 03/28/2013 15:41    ASSESSMENT / PLAN:  #1 GOLD D Copd with AB. No acute flare, assoc chronic resp failure, oxygen dependent.    Plan   Cont neb meds as rx    Cont spiriva   Cont oxygen   Add IS and flutter valve   I will f/u later in the week, apprec [facility] care and [facility]  #2 Pelvic abscess s/p IR drain s/p prior ex lap for adhesions for bowel obstruction and ventral hernia repair  Plan   Per [facility], IR,  ID   Cont curr ABX agree with same.  #3 MRSA bactermia no SBE  Plan   Per ID  #4 Severe protein cal malnutrition,   Plan   Adv diet as tolerated    Dorcas Carrow Beeper  [phone]  Cell  [phone]  If no response or cell goes to voicemail, call beeper [phone]  Pulmonary and Critical Care Medicine [facility] Pager: [phone]  03/30/2013, 7:38 AM

## 2013-03-30 NOTE — Progress Notes (Signed)
Report called to Brenda, RN.

## 2013-03-30 NOTE — Progress Notes (Signed)
Subjective: TG pelvic abscess drain placed 11/3 Pt doing well Output diminished  Objective: Vital signs in last 24 hours: Temp:  [98.4 F (36.9 C)-99.4 F (37.4 C)] 98.8 F (37.1 C) (11/05 0821) Pulse Rate:  [85-95] 85 (11/05 0821) Resp:  [12-19] 15 (11/05 0821) BP: (102-121)/(57-72) 115/72 mmHg (11/05 0821) SpO2:  [100 %] 100 % (11/05 0821) Weight:  [126 lb 12.2 oz (57.5 kg)] 126 lb 12.2 oz (57.5 kg) (11/05 0442) Last BM Date: 03/29/13  Intake/Output from previous day: 11/04 0701 - 11/05 0700 In: 2225 [P.O.:720; I.V.:1350; IV Piggyback:150] Out: 515 [Urine:375; Drains:25; Stool:115] Intake/Output this shift:    PE:  Afeb; vss Drain intact Site clean and dry; less tender Output serous with some purulence 25 cc yesterday 10 cc in JP now cx now growth   Lab Results:   Recent Labs  03/29/13 0445 03/30/13 0530  WBC 14.7* 11.2*  HGB 9.2* 9.0*  HCT 26.6* 26.7*  PLT 324 363   BMET  Recent Labs  03/29/13 0445 03/30/13 0530  NA 142 142  K 3.9 3.3*  CL 109 112  CO2 23 23  GLUCOSE 80 91  BUN 7 5*  CREATININE 1.03 0.93  CALCIUM 7.4* 7.5*   PT/INR  Recent Labs  03/28/13 1030  LABPROT 14.6  INR 1.16   ABG No results found for this basename: PHART, PCO2, PO2, HCO3,  in the last 72 hours  Studies/Results: Ct Image Guided Drainage Percut Cath  Peritoneal Retroperit  03/28/2013   CLINICAL DATA:  POSTOP pelvic ABSCESS, ELEVATED WHITE COUNT, FEVER  EXAM: CT-GUIDED TRANS GLUTEAL PELVIC ABSCESS DRAIN INSERTION  Date:  11/3/201411/07/2012 3:32 PM  Radiologist:  Judie Petit. Ruel Favors, MD  Guidance:  CT  MEDICATIONS AND MEDICAL HISTORY: 4 MG VERSED, 150 MCG FENTANYL  ANESTHESIA/SEDATION: 35 MIN  CONTRAST:  NONE.  FLUOROSCOPY TIME:  NONE.  PROCEDURE: Informed consent was obtained from the patient following explanation of the procedure, risks, benefits and alternatives. The patient understands, agrees and consents for the procedure. All questions were addressed. A time out  was performed.  Maximal barrier sterile technique utilized including caps, mask, sterile gowns, sterile gloves, large sterile drape, hand hygiene, and BETADINE.  Previous imaging reviewed. Patient was positioned prone. Noncontrast localization CT performed. The pelvic abscess was localized. Under sterile conditions and local anesthesia, an 18 gauge 10 cm access needle was advanced from a right trans gluteal approach into the collection. There was return of purulent fluid. Sample sent for Gram stain culture. Guidewire inserted followed by tract dilatation to insert 10 [place] drain. Catheter position confirmed with CT. Syringe aspiration yielded 155 cc purulent fluid. Catheter is secured with a Prolene suture and connected to external suction bulb. Sterile dressing applied. No immediate complication.  COMPLICATIONS: NO IMMEDIATE  IMPRESSION: Successful CT-guided right trans gluteal pelvic abscess drain insertion   Electronically Signed   By: Ruel Favors M.D.   On: 03/28/2013 15:41    Anti-infectives: Anti-infectives   Start     Dose/Rate Route Frequency Ordered Stop   03/27/13 2200  piperacillin-tazobactam (ZOSYN) IVPB 3.375 g     3.375 g 12.5 mL/hr over 240 Minutes Intravenous 3 times per day 03/27/13 1530     03/27/13 2200  hydroxychloroquine (PLAQUENIL) tablet 200 mg     200 mg Oral 2 times daily 03/27/13 1915     03/27/13 1430  piperacillin-tazobactam (ZOSYN) IVPB 3.375 g     3.375 g 100 mL/hr over 30 Minutes Intravenous  Once 03/27/13 1415 03/27/13 1514  03/27/13 0300  vancomycin (VANCOCIN) 500 mg in sodium chloride 0.9 % 100 mL IVPB     500 mg 100 mL/hr over 60 Minutes Intravenous Every 12 hours 03/27/13 2351     03/27/13 0100  vancomycin (VANCOCIN) 500 mg in sodium chloride 0.9 % 100 mL IVPB  Status:  Discontinued     500 mg 100 mL/hr over 60 Minutes Intravenous Every 12 hours 03/27/13 2227 03/27/13 2340      Assessment/Plan: s/p * No surgery found *  TG pelvic abscess drain  intact Will need Re CT when output less than 10-15 cc/day Plan per [facility]  LOS: 3 days    [name] A 03/30/2013

## 2013-03-31 ENCOUNTER — Inpatient Hospital Stay: Payer: Medicare Other | Admitting: Critical Care Medicine

## 2013-03-31 DIAGNOSIS — J438 Other emphysema: Secondary | ICD-10-CM

## 2013-03-31 DIAGNOSIS — R109 Unspecified abdominal pain: Secondary | ICD-10-CM

## 2013-03-31 LAB — CBC
HCT: 27.5 % — ABNORMAL LOW (ref 36.0–46.0)
MCH: 27.9 pg (ref 26.0–34.0)
MCHC: 33.1 g/dL (ref 30.0–36.0)
MCV: 84.4 fL (ref 78.0–100.0)
Platelets: 390 10*3/uL (ref 150–400)
RBC: 3.26 MIL/uL — ABNORMAL LOW (ref 3.87–5.11)
RDW: 14.9 % (ref 11.5–15.5)
WBC: 9.8 10*3/uL (ref 4.0–10.5)

## 2013-03-31 LAB — BASIC METABOLIC PANEL
BUN: 4 mg/dL — ABNORMAL LOW (ref 6–23)
CO2: 17 mEq/L — ABNORMAL LOW (ref 19–32)
Calcium: 8 mg/dL — ABNORMAL LOW (ref 8.4–10.5)
Chloride: 115 mEq/L — ABNORMAL HIGH (ref 96–112)
Creatinine, Ser: 0.91 mg/dL (ref 0.50–1.10)
GFR calc Af Amer: 76 mL/min — ABNORMAL LOW (ref >90)
GFR calc non Af Amer: 66 mL/min — ABNORMAL LOW (ref >90)
Potassium: 4.6 mEq/L (ref 3.5–5.1)
Sodium: 143 mEq/L (ref 135–145)

## 2013-03-31 LAB — GLUCOSE, CAPILLARY
Glucose-Capillary: 68 mg/dL — ABNORMAL LOW (ref 70–99)
Glucose-Capillary: 76 mg/dL (ref 70–99)

## 2013-03-31 MED ORDER — SODIUM CHLORIDE 0.9 % IJ SOLN
10.0000 mL | Freq: Two times a day (BID) | INTRAMUSCULAR | Status: DC
  Administered 2013-04-03: 10 mL

## 2013-03-31 MED ORDER — VANCOMYCIN HCL 500 MG IV SOLR
500.0000 mg | Freq: Two times a day (BID) | INTRAVENOUS | Status: AC
  Administered 2013-03-31 – 2013-04-01 (×3): 500 mg via INTRAVENOUS
  Filled 2013-03-31 (×5): qty 500

## 2013-03-31 MED ORDER — SODIUM CHLORIDE 0.9 % IV SOLN
1.0000 g | Freq: Three times a day (TID) | INTRAVENOUS | Status: DC
  Administered 2013-03-31 – 2013-04-06 (×18): 1 g via INTRAVENOUS
  Filled 2013-03-31 (×21): qty 1

## 2013-03-31 MED ORDER — HYDROCODONE-ACETAMINOPHEN 5-325 MG PO TABS
1.0000 | ORAL_TABLET | ORAL | Status: DC | PRN
  Administered 2013-03-31 – 2013-04-04 (×2): 1 via ORAL
  Filled 2013-03-31 (×2): qty 1

## 2013-03-31 MED ORDER — SODIUM CHLORIDE 0.9 % IJ SOLN
10.0000 mL | INTRAMUSCULAR | Status: DC | PRN
  Administered 2013-03-31 – 2013-04-05 (×3): 10 mL

## 2013-03-31 NOTE — Clinical Social Work Note (Signed)
CSW received handoff regarding SNF placement. At this time, pt has bed offers at [facility] and [facility], both offering private rooms for pt. CSW to continue to follow and assist with discharge planning needs.  Darlyn Chamber, LCSWA Clinical Social Worker [phone]

## 2013-03-31 NOTE — Progress Notes (Signed)
Physical Therapy Treatment Patient Details Name: [name] MRN: [id number] DOB: December 03, 1950 Today's Date: 03/31/2013 Time: 4098-1191 PT Time Calculation (min): 26 min  PT Assessment / Plan / Recommendation  History of Present Illness Patient is a 62 year old female with a history of Crohn's colitis, status post total abdominal colectomy and end ileostomy at [facility] in 2007.  She recently presented with acute small bowel obstruction. Pt underwent REPAIR OF INCISIONAL AND PARASTOMAL HERNIAS, LYSIS OF ADHESIONS FOR SMALL BOWEL OBSTRUCTION and INSERTION OF BIOLOGIC MESH. Pt presents this admit for    PT Comments   Patient demonstrates improved activity tolerance and was able to ambulate to hall today on 3 liters. Will continue to see as indicated and progress activity as tolerated.   Follow Up Recommendations  SNF;Supervision/Assistance - 24 hour     Does the patient have the potential to tolerate intense rehabilitation     Barriers to Discharge        Equipment Recommendations  None recommended by PT    Recommendations for Other Services    Frequency Min 3X/week   Progress towards PT Goals Progress towards PT goals: Progressing toward goals  Plan Current plan remains appropriate    Precautions / Restrictions Precautions Precautions: None Restrictions Weight Bearing Restrictions: No   Pertinent Vitals/Pain 5/10 pain reported    Mobility  Bed Mobility Bed Mobility: Supine to Sit;Sitting - Scoot to Edge of Bed Supine to Sit: 4: Min assist Sitting - Scoot to [place] of Bed: 4: Min assist Details for Bed Mobility Assistance: VCs for technique and assist for trunk elevation and hip rotation Transfers Transfers: Sit to Stand;Stand to Sit Sit to Stand: 4: Min assist;With upper extremity assist;With armrests;From chair/3-in-1 Stand to Sit: 4: Min guard;With upper extremity assist;With armrests;To chair/3-in-1 Details for Transfer Assistance: Patient used correct technique,  scooting to edge of chair and with proper hand placement.  Assist to rise to standing and for balance initially. Ambulation/Gait Ambulation/Gait Assistance: 4: Min assist Ambulation Distance (Feet): 54 Feet Assistive device: Rolling walker Gait Pattern: Step-through pattern;Decreased stride length;Trunk flexed Gait velocity: very slow Stairs: No Wheelchair Mobility Wheelchair Mobility: No    Exercises     PT Diagnosis:    PT Problem List:   PT Treatment Interventions:     PT Goals (current goals can now be found in the care plan section) Acute Rehab PT Goals Patient Stated Goal: go home after a short rehab stay PT Goal Formulation: With patient Time For Goal Achievement: 04/12/13 Potential to Achieve Goals: Good  Visit Information  Last PT Received On: 03/31/13 Assistance Needed: +1 History of Present Illness: Patient is a 62 year old female with a history of Crohn's colitis, status post total abdominal colectomy and end ileostomy at [facility] in 2007.  She recently presented with acute small bowel obstruction. Pt underwent REPAIR OF INCISIONAL AND PARASTOMAL HERNIAS, LYSIS OF ADHESIONS FOR SMALL BOWEL OBSTRUCTION and INSERTION OF BIOLOGIC MESH. Pt presents this admit for     Subjective Data  Subjective: "I am very cold" Patient Stated Goal: go home after a short rehab stay   Cognition  Cognition Arousal/Alertness: Awake/alert Behavior During Therapy: WFL for tasks assessed/performed Overall Cognitive Status: Within Functional Limits for tasks assessed    Balance  Static Standing Balance Static Standing - Balance Support: Bilateral upper extremity supported Static Standing - Level of Assistance: 4: Min assist  End of Session PT - End of Session Equipment Utilized During Treatment: Gait belt;Oxygen Activity Tolerance: Patient tolerated treatment well;Patient limited by  pain;Patient limited by fatigue Patient left: with call bell/phone within reach;in bed Nurse Communication:  Mobility status   GP     Fabio Asa 03/31/2013, 3:49 PM Charlotte Crumb, PT DPT  [phone]

## 2013-03-31 NOTE — Progress Notes (Signed)
Patient ID: [name], female   DOB: 12/05/1950, 62 y.o.   MRN: [id number]    Subjective: Pt feeling better today.  Mobilized once yesterday.  Tolerating full liquids and says they taste great.    Objective: Vital signs in last 24 hours: Temp:  [97.9 F (36.6 C)-100 F (37.8 C)] 97.9 F (36.6 C) (11/06 0416) Pulse Rate:  [88-91] 88 (11/06 0416) Resp:  [13-20] 20 (11/06 0416) BP: (114-125)/(51-63) 114/63 mmHg (11/06 0416) SpO2:  [97 %-100 %] 100 % (11/06 0416) FiO2 (%):  [100 %] 100 % (11/05 1016) Last BM Date: 03/30/13  Intake/Output from previous day: 11/05 0701 - 11/06 0700 In: 4650 [I.V.:4350; IV Piggyback:300] Out: 1270 [Urine:250; Drains:20; Stool:1000] Intake/Output this shift: Total I/O In: 250 [IV Piggyback:250] Out: -   PE: Abd: soft, still tender, but less so, +BS, ostomy working.  Drain with just serosang output Heart: regular  Lab Results:   Recent Labs  03/30/13 0530 03/31/13 0509  WBC 11.2* 9.8  HGB 9.0* 9.1*  HCT 26.7* 27.5*  PLT 363 390   BMET  Recent Labs  03/29/13 0445 03/30/13 0530  NA 142 142  K 3.9 3.3*  CL 109 112  CO2 23 23  GLUCOSE 80 91  BUN 7 5*  CREATININE 1.03 0.93  CALCIUM 7.4* 7.5*   PT/INR  Recent Labs  03/28/13 1030  LABPROT 14.6  INR 1.16   CMP     Component Value Date/Time   NA 142 03/30/2013 0530   K 3.3* 03/30/2013 0530   CL 112 03/30/2013 0530   CO2 23 03/30/2013 0530   GLUCOSE 91 03/30/2013 0530   BUN 5* 03/30/2013 0530   CREATININE 0.93 03/30/2013 0530   CREATININE 1.04 08/27/2012 1054   CALCIUM 7.5* 03/30/2013 0530   CALCIUM 9.0 07/15/2011 0920   PROT 5.7* 03/27/2013 1333   ALBUMIN 2.1* 03/27/2013 1333   AST 30 03/27/2013 1333   ALT 23 03/27/2013 1333   ALKPHOS 132* 03/27/2013 1333   BILITOT 0.4 03/27/2013 1333   GFRNONAA 64* 03/30/2013 0530   GFRAA 74* 03/30/2013 0530   Lipase     Component Value Date/Time   LIPASE 23 03/14/2013 0940       Studies/Results: No results  found.  Anti-infectives: Anti-infectives   Start     Dose/Rate Route Frequency Ordered Stop   03/31/13 0700  vancomycin (VANCOCIN) 500 mg in sodium chloride 0.9 % 100 mL IVPB     500 mg 100 mL/hr over 60 Minutes Intravenous Every 12 hours 03/31/13 0628 04/01/13 2359   03/27/13 2200  piperacillin-tazobactam (ZOSYN) IVPB 3.375 g     3.375 g 12.5 mL/hr over 240 Minutes Intravenous 3 times per day 03/27/13 1530     03/27/13 2200  hydroxychloroquine (PLAQUENIL) tablet 200 mg     200 mg Oral 2 times daily 03/27/13 1915     03/27/13 1430  piperacillin-tazobactam (ZOSYN) IVPB 3.375 g     3.375 g 100 mL/hr over 30 Minutes Intravenous  Once 03/27/13 1415 03/27/13 1514   03/27/13 0300  vancomycin (VANCOCIN) 500 mg in sodium chloride 0.9 % 100 mL IVPB  Status:  Discontinued     500 mg 100 mL/hr over 60 Minutes Intravenous Every 12 hours 03/27/13 2351 03/31/13 0628   03/27/13 0100  vancomycin (VANCOCIN) 500 mg in sodium chloride 0.9 % 100 mL IVPB  Status:  Discontinued     500 mg 100 mL/hr over 60 Minutes Intravenous Every 12 hours 03/27/13 2227 03/27/13 2340  Assessment/Plan  1. Pelvic abscess, s/p perc drain 2. S/p ex lap with LOA and ventral and peristomal hernia repair 3. MRSA bactermia  4. DM 5. COPD  Plan: 1. Patient is feeling better today.  Will advance to low fiber diet. 2. Repeat CT scan on Sunday 3. Vanc to stop tomorrow per ID, I put a stop date on this. 4. ID is recommending 2 weeks of IV zosyn.  For intra-abdominal abscesses, we usually transition to oral abx therapy.  May discuss with them to see if this is reasonable at time of discharge so she doesn't have to be dc with her PICC 5. CBGs are stable, will monitor 6. BP stable  LOS: 4 days    Ameya Vowell E 03/31/2013, 8:52 AM Pager: [phone]

## 2013-03-31 NOTE — Progress Notes (Addendum)
[facility]      Subjective: C./o abdominal and back pain   Antibiotics:  Anti-infectives   Start     Dose/Rate Route Frequency Ordered Stop   03/31/13 1400  meropenem (MERREM) 1 g in sodium chloride 0.9 % 100 mL IVPB     1 g 200 mL/hr over 30 Minutes Intravenous 3 times per day 03/31/13 1231     03/31/13 0700  vancomycin (VANCOCIN) 500 mg in sodium chloride 0.9 % 100 mL IVPB     500 mg 100 mL/hr over 60 Minutes Intravenous Every 12 hours 03/31/13 0628 04/01/13 2359   03/27/13 2200  piperacillin-tazobactam (ZOSYN) IVPB 3.375 g  Status:  Discontinued     3.375 g 12.5 mL/hr over 240 Minutes Intravenous 3 times per day 03/27/13 1530 03/31/13 1204   03/27/13 2200  hydroxychloroquine (PLAQUENIL) tablet 200 mg     200 mg Oral 2 times daily 03/27/13 1915     03/27/13 1430  piperacillin-tazobactam (ZOSYN) IVPB 3.375 g     3.375 g 100 mL/hr over 30 Minutes Intravenous  Once 03/27/13 1415 03/27/13 1514   03/27/13 0300  vancomycin (VANCOCIN) 500 mg in sodium chloride 0.9 % 100 mL IVPB  Status:  Discontinued     500 mg 100 mL/hr over 60 Minutes Intravenous Every 12 hours 03/27/13 2351 03/31/13 0628   03/27/13 0100  vancomycin (VANCOCIN) 500 mg in sodium chloride 0.9 % 100 mL IVPB  Status:  Discontinued     500 mg 100 mL/hr over 60 Minutes Intravenous Every 12 hours 03/27/13 2227 03/27/13 2340      Medications: Scheduled Meds: . arformoterol  15 mcg Nebulization BID  . budesonide  0.25 mg Nebulization BID  . citalopram  20 mg Oral Daily  . diltiazem  30 mg Oral Q8H  . enoxaparin (LOVENOX) injection  40 mg Subcutaneous Q24H  . famotidine  20 mg Oral BID  . fluticasone  2 spray Each Nare Daily  . guaiFENesin  1,200 mg Oral BID  . hydroxychloroquine  200 mg Oral BID  . insulin aspart  0-9 Units Subcutaneous TID WC  . meropenem (MERREM) IV  1 g Intravenous Q8H  . sodium chloride  3 mL Intravenous Q12H  . tiotropium  18 mcg Inhalation Daily  .  vancomycin  500 mg Intravenous Q12H   Continuous Infusions: . sodium chloride 50 mL/hr at 03/31/13 0919   PRN Meds:.acetaminophen, HYDROcodone-acetaminophen, iohexol, LORazepam, morphine injection   Objective: Weight change:   Intake/Output Summary (Last 24 hours) at 03/31/13 1714 Last data filed at 03/31/13 1300  Gross per 24 hour  Intake   5020 ml  Output    770 ml  Net   4250 ml   Blood pressure 127/54, pulse 94, temperature 99.1 F (37.3 C), temperature source Oral, resp. rate 18, height 5\' 1"  (1.549 m), weight 126 lb 12.2 oz (57.5 kg), SpO2 99.00%. Temp:  [97.9 F (36.6 C)-99.1 F (37.3 C)] 99.1 F (37.3 C) (11/06 1421) Pulse Rate:  [88-94] 94 (11/06 1421) Resp:  [18-20] 18 (11/06 1421) BP: (114-127)/(54-63) 127/54 mmHg (11/06 1421) SpO2:  [94 %-100 %] 99 % (11/06 1421)  Physical Exam: General: Alert and awake, oriented x3, not in any acute distress.  HEENT: anicteric sclera, pupils reactive to light and accommodation, EOMI, oropharynx clear and without exudate  CVS regular rate, normal r, no murmur rubs or gallops  Chest: clear to auscultation bilaterally, no wheezing, rales or rhonchi  Abdomen: dressings  in place, JP drain with hazy material in place Extremities: no clubbing or edema noted bilaterally  Skin: no rashes  Neuro: nonfocal,  Lab Results:  Recent Labs  03/30/13 0530 03/31/13 0509  WBC 11.2* 9.8  HGB 9.0* 9.1*  HCT 26.7* 27.5*  PLT 363 390    BMET  Recent Labs  03/30/13 0530 03/31/13 0509  NA 142 143  K 3.3* 4.6  CL 112 115*  CO2 23 17*  GLUCOSE 91 76  BUN 5* 4*  CREATININE 0.93 0.91  CALCIUM 7.5* 8.0*    Micro Results: Recent Results (from the past 240 hour(s))  URINE CULTURE     Status: None   Collection Time    03/27/13  1:53 PM      Result Value Range Status   Specimen Description URINE, CLEAN CATCH   Final   Special Requests NONE   Final   Culture  Setup Time     Final   Value: 03/27/2013 21:50     Performed at [facility] Count     Final   Value: 15,000 COLONIES/ML     Performed at [facility]   Culture     Final   Value: YEAST     Performed at [facility]   Report Status 03/28/2013 FINAL   Final  CULTURE, BLOOD (ROUTINE X 2)     Status: None   Collection Time    03/27/13  1:55 PM      Result Value Range Status   Specimen Description BLOOD PICC LINE   Final   Special Requests BOTTLES DRAWN AEROBIC AND ANAEROBIC 10CC   Final   Culture  Setup Time     Final   Value: 03/27/2013 21:48     Performed at [facility]   Culture     Final   Value:        BLOOD CULTURE RECEIVED NO GROWTH TO DATE CULTURE WILL BE HELD FOR 5 DAYS BEFORE ISSUING A FINAL NEGATIVE REPORT     Performed at [facility]   Report Status PENDING   Incomplete  CULTURE, BLOOD (ROUTINE X 2)     Status: None   Collection Time    03/27/13  3:25 PM      Result Value Range Status   Specimen Description BLOOD RIGHT HAND   Final   Special Requests     Final   Value: BOTTLES DRAWN AEROBIC AND ANAEROBIC 10CC BLUE,5CC RED   Culture  Setup Time     Final   Value: 03/27/2013 21:48     Performed at [facility]   Culture     Final   Value:        BLOOD CULTURE RECEIVED NO GROWTH TO DATE CULTURE WILL BE HELD FOR 5 DAYS BEFORE ISSUING A FINAL NEGATIVE REPORT     Performed at [facility]   Report Status PENDING   Incomplete  MRSA PCR SCREENING     Status: None   Collection Time    03/27/13  7:17 PM      Result Value Range Status   MRSA by PCR NEGATIVE  NEGATIVE Final   Comment:            The GeneXpert MRSA Assay (FDA     approved for NASAL specimens     only), is one component of a     comprehensive MRSA colonization     surveillance program. It is  not     intended to diagnose MRSA     infection nor to guide or     monitor treatment for     MRSA infections.  CULTURE, ROUTINE-ABSCESS     Status: None   Collection Time    03/28/13  3:27 PM      Result Value Range  Status   Specimen Description ABSCESS BUTTOCKS RIGHT   Final   Special Requests NONE   Final   Gram Stain     Final   Value: MODERATE WBC PRESENT,BOTH PMN AND MONONUCLEAR     NO SQUAMOUS EPITHELIAL CELLS SEEN     ABUNDANT GRAM NEGATIVE RODS     Performed at [facility]   Culture     Final   Value: NO GROWTH 2 DAYS     Performed at [facility]   Report Status PENDING   Incomplete  ANAEROBIC CULTURE     Status: None   Collection Time    03/28/13  3:27 PM      Result Value Range Status   Specimen Description ABSCESS BUTTOCKS RIGHT   Final   Special Requests NONE   Final   Gram Stain PENDING   Incomplete   Culture     Final   Value: NO ANAEROBES ISOLATED; CULTURE IN PROGRESS FOR 5 DAYS     Performed at [facility]   Report Status PENDING   Incomplete    Studies/Results: No results found.    Assessment/Plan: [name] is a 62 y.o. female with   Crohn's disease diabetes mellitus recently discharged from the hospital after undergoing laparotomy for adhesions on 10/22. Post op she was found to have MRSA Bacteremia in one set of blood cultures. TTE and TEE did not show any vegetations. She was discharged on IV vancomycin, now readmitted with intra-abdominal abscess currently on vancomycin and Zosyn with plans for IR drainage yesterday with GNR growing on culture  #1 Intrabdominal abscess:   - I changed to meropenem  dosed q 8 hours for SNF,   I would not be opposed to oral abx but only problem is that I dont know what the identity or sensitivity of the large  amount of GNR seen on GS that never have grown might have been.   Presumably they were sensitive to zosyn but I really cant know if they would have also been s to levaquin, bactrim etc  My preference would be to play it on aggressive side with IV anti-pseudomonal carbapenem so there would be low chance of abx failure, esp since she has been readmitted multiple times already. She does already  have a PICC as well placed for her rx for MRSA bacteremia   I would make sure she gets at least 14 days of therapy with repeat imaging of abdomen in the interval   #2 MRSA Bacteremia: no vegetations on TEE  --will make sure that she gets IV vancomycin thru 04/01/13 and then will stop this. She had clean TEE     LOS: 4 days   Acey Lav 03/31/2013, 5:14 PM

## 2013-03-31 NOTE — Progress Notes (Signed)
[facility] PROGRESS NOTE  [name] ZOX:[id number] DOB: March 20, 1951 DOA: 03/27/2013 PCP: [name], MD  Brief narrative:  62 y.o. 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 Post op she was found to have MRSA Bacteremia in one set of blood cultures. TTE and TEE did not show any vegetations. She was discharged on IV vancomycin to complete a 14 day course. She got a PICC line before discharge,. She returned to the ED on 11/2  with  fever chills, mild abdominal discomfort and nausea, without vomiting. On arrival to ED, she was found to be febrile , hypotensive and  tachycardic. Labs revealed leukocytosis. A CXR was negative for pneumonia . CT abdomen and pelvis showed pelvic abscess. patient admitted to stepdown unit.   Assessment/Plan:  Sepsis  -due to pelvic abscesses . Drain placed by IR on 11/3 -cx growing GNR. - on empiric vancomycin and meropenem. ID following. D/c vancomycin on 11/7 . Meropenem for total 2 weeks course ( until 11/16)  Hypotension  -secondary to volume depletion and pain meds  -cont IVFs  -resumed cardizem at low dose. BP stable today.   Anemia  -transfused 2 units PRBCs due to soft BP readings 11/3  -Hgb up from 6.4 to 9 -baseline Hgb ~ 10-11  likely in the setting of recent sx and underlying sepsis. No active blood loss noted.   DIABETES, TYPE 2  -CBGs stable   MULTIFOCAL ATRIAL TACHYCARDIA  -recent admission  for this -HR currently controlled  -on low dose cardizem due to low normal BP  Chronic hypoxic respiratory failure/Gold stage D. COPD  -stable - followed by [facility]  -On chronic o2 via [place].contineu inhalers and prn nebs   S/p recent laparotomy with LOA and ventral hernia repair  primary team following Plan on repeat CT scan in few days  Rheumatoid arthritis  -on Plaquenil   DVT  prophylaxis: sq Lovenox   Code Status: Full   Procedures:  Insertion of percutaneous drain to pelvic abscess 11/3   Antibiotics:  Zosyn 11/2 >>> 11/5 Meropenem (11/6>>) Vancomycin 11/2 >>>   HPI/Subjective:  Patient c/o abdominal pain today. Has had BMs     Objective: Filed Vitals:   03/31/13 1421  BP: 127/54  Pulse: 94  Temp: 99.1 F (37.3 C)  Resp: 18    Intake/Output Summary (Last 24 hours) at 03/31/13 1527 Last data filed at 03/31/13 1300  Gross per 24 hour  Intake   5020 ml  Output    970 ml  Net   4050 ml   Filed Weights   03/27/13 1300 03/27/13 1919 03/30/13 0442  Weight: 65.6 kg (144 lb 10 oz) 58.2 kg (128 lb 4.9 oz) 57.5 kg (126 lb 12.2 oz)    Exam:   General:  Elderly female in NAD, appears fatigued   chest : clear b/l, no added sounds   CVS: NS1 and  s2, No added sounds  Respiratory: clear b/l, no added sounds   abd: soft, laparotomy scar ostomy. Pelvic drain with serosanguinous drainage, BS+,abdominal  tenderness present.  Ext: warm, no edema   CNS: AAOX3**   Data Reviewed: Basic Metabolic Panel:  Recent Labs Lab 03/27/13 1333 03/27/13 1550 03/27/13 1559 03/28/13 0500 03/29/13 0445 03/30/13 0530 03/31/13 0509  NA 137  --  141 139 142 142 143  K 3.2*  --  3.0* 3.4* 3.9  3.3* 4.6  CL 100  --  100 106 109 112 115*  CO2 27  --   --  24 23 23  17*  GLUCOSE 87  --  80 110* 80 91 76  BUN 8  --  6 6 7  5* 4*  CREATININE 1.01  --  1.20* 1.00 1.03 0.93 0.91  CALCIUM 7.9*  --   --  7.6* 7.4* 7.5* 8.0*  MG  --  1.6  --   --   --   --   --    Liver Function Tests:  Recent Labs Lab 03/27/13 1333  AST 30  ALT 23  ALKPHOS 132*  BILITOT 0.4  PROT 5.7*  ALBUMIN 2.1*   No results found for this basename: LIPASE, AMYLASE,  in the last 168 hours No results found for this basename: AMMONIA,  in the last 168 hours CBC:  Recent Labs Lab 03/27/13 1333 03/27/13 1559 03/28/13 0500 03/29/13 0445 03/30/13 0530 03/31/13 0509  WBC 20.7*   --  13.6* 14.7* 11.2* 9.8  NEUTROABS 18.2*  --   --   --   --   --   HGB 7.6* 7.5* 6.4* 9.2* 9.0* 9.1*  HCT 22.6* 22.0* 19.2* 26.6* 26.7* 27.5*  MCV 84.6  --  83.8 82.1 82.9 84.4  PLT 334  --  366 324 363 390   Cardiac Enzymes: No results found for this basename: CKTOTAL, CKMB, CKMBINDEX, TROPONINI,  in the last 168 hours BNP (last 3 results) No results found for this basename: PROBNP,  in the last 8760 hours CBG:  Recent Labs Lab 03/29/13 1215 03/30/13 0823 03/30/13 1326 03/31/13 0819 03/31/13 1239  GLUCAP 109* 85 101* 68* 76    Recent Results (from the past 240 hour(s))  URINE CULTURE     Status: None   Collection Time    03/27/13  1:53 PM      Result Value Range Status   Specimen Description URINE, CLEAN CATCH   Final   Special Requests NONE   Final   Culture  Setup Time     Final   Value: 03/27/2013 21:50     Performed at [facility] Count     Final   Value: 15,000 COLONIES/ML     Performed at [facility]   Culture     Final   Value: YEAST     Performed at [facility]   Report Status 03/28/2013 FINAL   Final  CULTURE, BLOOD (ROUTINE X 2)     Status: None   Collection Time    03/27/13  1:55 PM      Result Value Range Status   Specimen Description BLOOD PICC LINE   Final   Special Requests BOTTLES DRAWN AEROBIC AND ANAEROBIC 10CC   Final   Culture  Setup Time     Final   Value: 03/27/2013 21:48     Performed at [facility]   Culture     Final   Value:        BLOOD CULTURE RECEIVED NO GROWTH TO DATE CULTURE WILL BE HELD FOR 5 DAYS BEFORE ISSUING A FINAL NEGATIVE REPORT     Performed at [facility]   Report Status PENDING   Incomplete  CULTURE, BLOOD (ROUTINE X 2)     Status: None   Collection Time    03/27/13  3:25 PM      Result Value Range Status   Specimen Description BLOOD RIGHT  HAND   Final   Special Requests     Final   Value: BOTTLES DRAWN AEROBIC AND ANAEROBIC 10CC BLUE,5CC RED   Culture  Setup  Time     Final   Value: 03/27/2013 21:48     Performed at [facility]   Culture     Final   Value:        BLOOD CULTURE RECEIVED NO GROWTH TO DATE CULTURE WILL BE HELD FOR 5 DAYS BEFORE ISSUING A FINAL NEGATIVE REPORT     Performed at [facility]   Report Status PENDING   Incomplete  MRSA PCR SCREENING     Status: None   Collection Time    03/27/13  7:17 PM      Result Value Range Status   MRSA by PCR NEGATIVE  NEGATIVE Final   Comment:            The GeneXpert MRSA Assay (FDA     approved for NASAL specimens     only), is one component of a     comprehensive MRSA colonization     surveillance program. It is not     intended to diagnose MRSA     infection nor to guide or     monitor treatment for     MRSA infections.  CULTURE, ROUTINE-ABSCESS     Status: None   Collection Time    03/28/13  3:27 PM      Result Value Range Status   Specimen Description ABSCESS BUTTOCKS RIGHT   Final   Special Requests NONE   Final   Gram Stain     Final   Value: MODERATE WBC PRESENT,BOTH PMN AND MONONUCLEAR     NO SQUAMOUS EPITHELIAL CELLS SEEN     ABUNDANT GRAM NEGATIVE RODS     Performed at [facility]   Culture     Final   Value: NO GROWTH 2 DAYS     Performed at [facility]   Report Status PENDING   Incomplete  ANAEROBIC CULTURE     Status: None   Collection Time    03/28/13  3:27 PM      Result Value Range Status   Specimen Description ABSCESS BUTTOCKS RIGHT   Final   Special Requests NONE   Final   Gram Stain PENDING   Incomplete   Culture     Final   Value: NO ANAEROBES ISOLATED; CULTURE IN PROGRESS FOR 5 DAYS     Performed at [facility]   Report Status PENDING   Incomplete     Studies: No results found.  Scheduled Meds: . arformoterol  15 mcg Nebulization BID  . budesonide  0.25 mg Nebulization BID  . citalopram  20 mg Oral Daily  . diltiazem  30 mg Oral Q8H  . enoxaparin (LOVENOX) injection  40 mg Subcutaneous Q24H  .  famotidine  20 mg Oral BID  . fluticasone  2 spray Each Nare Daily  . guaiFENesin  1,200 mg Oral BID  . hydroxychloroquine  200 mg Oral BID  . insulin aspart  0-9 Units Subcutaneous TID WC  . meropenem (MERREM) IV  1 g Intravenous Q8H  . sodium chloride  3 mL Intravenous Q12H  . tiotropium  18 mcg Inhalation Daily  . vancomycin  500 mg Intravenous Q12H   Continuous Infusions: . sodium chloride 50 mL/hr at 03/31/13 0919      Time spent:25 minutes    Catarino Vold  [facility] Pager [phone]. If 7PM-7AM, please contact night-coverage at [email or web address], password [email or web address] 03/31/2013, 3:27 PM  LOS: 4 days

## 2013-03-31 NOTE — Progress Notes (Signed)
PULMONARY  / CRITICAL CARE MEDICINE  Name: [name] MRN: [id number] DOB: 10/05/1950    ADMISSION DATE:  03/27/2013 CONSULTATION DATE:  03/31/2013   REFERRING MD :  [facility] PRIMARY SERVICE:  [facility]  CHIEF COMPLAINT:  abd pain  BRIEF PATIENT DESCRIPTION:  62 y.o.AAF well known to me, Gold D Copd /AB oxygen dependent.  S/p exlap for adhesions/ventral hernia in October 2014, assoc MRSA bacteremia without SBE. Returned 03/27/13 with pelvic abscess with GNR on IR drain.  Pt improved now.    SIGNIFICANT EVENTS / STUDIES:  IR abscess drain 11/3 pelvis   LINES / TUBES: PICC RUE 10/28 IR pelvis drain 11/3   CULTURES: 11/2 UC >>15K yeast 11/2 BCx 2>>NGTD 11/3 pelvis drainage>>GNR    ANTIBIOTICS: Zosyn 11/3>> Vanco 02/2013>>    SUBJECTIVE:  Dyspneic  VITAL SIGNS: Temp:  [97.9 F (36.6 C)-100 F (37.8 C)] 97.9 F (36.6 C) (11/06 0416) Pulse Rate:  [88-91] 88 (11/06 0416) Resp:  [13-20] 20 (11/06 0416) BP: (114-125)/(51-63) 114/63 mmHg (11/06 0416) SpO2:  [97 %-100 %] 100 % (11/06 0416) FiO2 (%):  [100 %] 100 % (11/05 1016)  PHYSICAL EXAMINATION: General:  Ill appearing in NAD Neuro:  Intact, non focal HEENT:  Moist mucus membranes Neck:  supple Cardiovascular:  RRR nl s1 s2 no s3 s4 Lungs:  Distant BS, no rhonchi Abdomen:  Tender but less so, BSA, drain and ostomy in place Musculoskeletal:  from Skin:  clear   Recent Labs Lab 03/28/13 0500 03/29/13 0445 03/30/13 0530  NA 139 142 142  K 3.4* 3.9 3.3*  CL 106 109 112  CO2 24 23 23   BUN 6 7 5*  CREATININE 1.00 1.03 0.93  GLUCOSE 110* 80 91    Recent Labs Lab 03/29/13 0445 03/30/13 0530 03/31/13 0509  HGB 9.2* 9.0* 9.1*  HCT 26.6* 26.7* 27.5*  WBC 14.7* 11.2* 9.8  PLT 324 363 390   No results found.  ASSESSMENT / PLAN:  #1 GOLD D Copd with AB. No acute flare, assoc chronic resp failure, oxygen dependent.    Plan   Cont neb meds as rx    Cont spiriva   Cont oxygen   Cont  IS and flutter  valve   apprec TRH care and CCS   Need to mobilize   #2 Pelvic abscess s/p IR drain s/p prior ex lap for adhesions for bowel obstruction and ventral hernia repair  Plan   Per CCS, IR, ID   Cont curr ABX agree with same.  #3 MRSA bactermia no SBE  Plan   Per ID  #4 Severe protein cal malnutrition,   Plan   Adv diet as tolerated    Dorcas Carrow Beeper  [phone]  Cell  [phone]  If no response or cell goes to voicemail, call beeper [phone]  Pulmonary and Critical Care Medicine [facility] Pager: [phone]  03/31/2013, 8:27 AM

## 2013-03-31 NOTE — Progress Notes (Signed)
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   Microbiology: Recent Results (from the past 720 hour(s))  MRSA PCR SCREENING     Status: Abnormal   Collection Time    03/14/13  9:45 PM      Result Value Range Status   MRSA by PCR POSITIVE (*) NEGATIVE Final   Comment:            The GeneXpert MRSA Assay (FDA     approved for NASAL specimens     only), is one component of a     comprehensive MRSA colonization     surveillance program. It is not     intended to diagnose MRSA     infection nor to guide or     monitor treatment for     MRSA infections.     RESULT CALLED TO, READ BACK BY AND VERIFIED WITH:     CALLED TO RN ARIEL MUHAMMAD [phone] @0041  [name]  CULTURE, BLOOD (ROUTINE X 2)     Status: None   Collection Time    03/16/13  6:23 AM      Result Value Range Status   Specimen Description BLOOD RIGHT HAND   Final   Special Requests BOTTLES DRAWN AEROBIC AND ANAEROBIC 10CC EACH   Final   Culture  Setup Time     Final   Value: 03/16/2013 11:09     Performed at [facility]   Culture     Final   Value: NO GROWTH 5 DAYS     Performed at [facility]   Report Status 03/22/2013 FINAL   Final  CULTURE, BLOOD (ROUTINE X 2)     Status: None   Collection Time    03/16/13  6:33 AM      Result Value Range Status   Specimen Description BLOOD LEFT HAND   Final   Special Requests BOTTLES DRAWN AEROBIC ONLY 8CC   Final   Culture  Setup Time     Final   Value: 03/16/2013 11:09     Performed at [facility]   Culture     Final   Value: METHICILLIN RESISTANT STAPHYLOCOCCUS AUREUS     Note: RIFAMPIN AND GENTAMICIN SHOULD NOT BE USED AS SINGLE DRUGS FOR TREATMENT OF STAPH INFECTIONS. CRITICAL RESULT CALLED TO, READ BACK BY AND VERIFIED WITH: RACHEL LESPERANCE 03/18/13 1345 BY [email or web address]     Note: Gram Stain Report Called to,Read Back By and Verified With: DEANNA DILLON ON 03/18/2013 AT 12:30A BY Serafina Mitchell     Performed at [facility]  Report Status 03/19/2013 FINAL   Final   Organism ID, Bacteria METHICILLIN RESISTANT STAPHYLOCOCCUS AUREUS   Final  CULTURE, BLOOD (ROUTINE X 2)     Status: None   Collection Time    03/18/13  4:30 PM      Result Value Range Status   Specimen Description BLOOD RIGHT HAND   Final   Special Requests BOTTLES DRAWN AEROBIC ONLY 10CC   Final   Culture  Setup Time     Final   Value: 03/18/2013 22:25     Performed at [facility]   Culture     Final   Value: NO GROWTH 5 DAYS     Performed at [facility]   Report Status 03/24/2013 FINAL   Final  CULTURE, BLOOD (ROUTINE X 2)     Status: None   Collection Time    03/18/13  4:45 PM      Result Value Range Status   Specimen Description BLOOD LEFT ARM   Final   Special Requests BOTTLES DRAWN AEROBIC ONLY 10CC   Final   Culture  Setup Time     Final   Value: 03/18/2013 22:26     Performed at [facility]   Culture     Final   Value: NO GROWTH 5 DAYS     Performed at [facility]   Report Status 03/24/2013 FINAL   Final  URINE CULTURE     Status: None   Collection Time    03/21/13 10:11 AM      Result Value  Range Status   Specimen Description URINE, RANDOM   Final   Special Requests NONE   Final   Culture  Setup Time     Final   Value: 03/21/2013 11:01     Performed at [facility] Count     Final   Value: NO GROWTH     Performed at [facility]   Culture     Final   Value: NO GROWTH     Performed at [facility]   Report Status 03/22/2013 FINAL   Final  URINE CULTURE     Status: None   Collection Time    03/27/13  1:53 PM      Result Value Range Status   Specimen Description URINE, CLEAN CATCH   Final   Special Requests NONE   Final   Culture  Setup Time     Final   Value: 03/27/2013 21:50     Performed at [facility] Count     Final   Value: 15,000 COLONIES/ML     Performed at [facility]   Culture     Final   Value: YEAST     Performed at [facility]   Report Status 03/28/2013 FINAL   Final  CULTURE, BLOOD (ROUTINE X 2)     Status: None   Collection Time    03/27/13  1:55 PM      Result Value Range Status   Specimen Description BLOOD PICC LINE   Final   Special Requests BOTTLES DRAWN AEROBIC AND ANAEROBIC 10CC   Final   Culture  Setup Time     Final   Value: 03/27/2013 21:48     Performed at [facility]   Culture     Final   Value:        BLOOD CULTURE RECEIVED NO GROWTH TO DATE CULTURE WILL  BE HELD FOR 5 DAYS BEFORE ISSUING A FINAL NEGATIVE REPORT     Performed at [facility]   Report Status PENDING   Incomplete  CULTURE, BLOOD (ROUTINE X 2)     Status: None   Collection Time    03/27/13  3:25 PM      Result Value Range Status   Specimen Description BLOOD RIGHT HAND   Final   Special Requests     Final   Value: BOTTLES DRAWN AEROBIC AND ANAEROBIC 10CC BLUE,5CC RED   Culture  Setup Time     Final   Value: 03/27/2013 21:48     Performed at [facility]   Culture     Final   Value:        BLOOD CULTURE RECEIVED NO GROWTH TO DATE CULTURE WILL BE HELD FOR 5 DAYS BEFORE  ISSUING A FINAL NEGATIVE REPORT     Performed at [facility]   Report Status PENDING   Incomplete  MRSA PCR SCREENING     Status: None   Collection Time    03/27/13  7:17 PM      Result Value Range Status   MRSA by PCR NEGATIVE  NEGATIVE Final   Comment:            The GeneXpert MRSA Assay (FDA     approved for NASAL specimens     only), is one component of a     comprehensive MRSA colonization     surveillance program. It is not     intended to diagnose MRSA     infection nor to guide or     monitor treatment for     MRSA infections.  CULTURE, ROUTINE-ABSCESS     Status: None   Collection Time    03/28/13  3:27 PM      Result Value Range Status   Specimen Description ABSCESS BUTTOCKS RIGHT   Final   Special Requests NONE   Final   Gram Stain     Final   Value: MODERATE WBC PRESENT,BOTH PMN AND MONONUCLEAR     NO SQUAMOUS EPITHELIAL CELLS SEEN     ABUNDANT GRAM NEGATIVE RODS     Performed at [facility]   Culture     Final   Value: NO GROWTH 2 DAYS     Performed at [facility]   Report Status PENDING   Incomplete  ANAEROBIC CULTURE     Status: None   Collection Time    03/28/13  3:27 PM      Result Value Range Status   Specimen Description ABSCESS BUTTOCKS RIGHT   Final   Special Requests NONE   Final   Gram Stain PENDING   Incomplete   Culture     Final   Value: NO ANAEROBES ISOLATED; CULTURE IN PROGRESS FOR 5 DAYS     Performed at [facility]   Report Status PENDING   Incomplete    Anti-infectives Anti-infectives   Start     Dose/Rate Route Frequency Ordered Stop   03/31/13 0700  vancomycin (VANCOCIN) 500 mg in sodium chloride 0.9 % 100 mL IVPB     500 mg 100 mL/hr over 60 Minutes Intravenous Every 12 hours 03/31/13 0628 04/01/13 2359   03/27/13 2200  piperacillin-tazobactam (ZOSYN) IVPB 3.375 g  Status:  Discontinued     3.375 g 12.5 mL/hr over 240 Minutes Intravenous 3 times per day 03/27/13 1530 03/31/13 1204   03/27/13  2200  hydroxychloroquine (PLAQUENIL) tablet 200 mg     200 mg Oral 2 times daily 03/27/13 1915     03/27/13 1430  piperacillin-tazobactam (ZOSYN) IVPB 3.375 g     3.375 g 100 mL/hr over 30 Minutes Intravenous  Once 03/27/13 1415 03/27/13 1514   03/27/13 0300  vancomycin (VANCOCIN) 500 mg in sodium chloride 0.9 % 100 mL IVPB  Status:  Discontinued     500 mg 100 mL/hr over 60 Minutes Intravenous Every 12 hours 03/27/13 2351 03/31/13 0628   03/27/13 0100  vancomycin (VANCOCIN) 500 mg in sodium chloride 0.9 % 100 mL IVPB  Status:  Discontinued     500 mg 100 mL/hr over 60 Minutes Intravenous Every 12 hours 03/27/13 2227 03/27/13 2340      Assessment:  62 y/o female with abdominal abscess on Vancomycin [ and Zosyn ] for treatment of infection.  Zosyn discontinued today by ID, and Meropenem ordered for 14 days of therapy.  Patient to be Transfer to SNF soon.  Renal function with CrCl ~ 52 ml/min. Afebrile, WBC 9.8.   Goal of Therapy:  Antibiotics selected for infection/cultures and adjusted for renal function.  Eradication of abscess infection.  Plan:   Begin Meropenem 1 gm IV q 8 hours. Follow up SCr, UOP, cultures, clinical course and adjust as clinically indicated.   Laurena Bering, Pharm.D.  03/31/2013 12:26 PM

## 2013-03-31 NOTE — Progress Notes (Signed)
I have seen and examined the patient and agree with the assessment and plans.  Connelly [place] A. Negan Grudzien  MD, FACS  

## 2013-04-01 ENCOUNTER — Inpatient Hospital Stay (HOSPITAL_COMMUNITY): Payer: Medicare Other

## 2013-04-01 DIAGNOSIS — F411 Generalized anxiety disorder: Secondary | ICD-10-CM

## 2013-04-01 LAB — CULTURE, ROUTINE-ABSCESS: Culture: NO GROWTH

## 2013-04-01 LAB — CBC
HCT: 26.2 % — ABNORMAL LOW (ref 36.0–46.0)
Hemoglobin: 8.6 g/dL — ABNORMAL LOW (ref 12.0–15.0)
MCH: 27.7 pg (ref 26.0–34.0)
MCHC: 32.8 g/dL (ref 30.0–36.0)
MCV: 84.5 fL (ref 78.0–100.0)
Platelets: 392 10*3/uL (ref 150–400)
RBC: 3.1 MIL/uL — ABNORMAL LOW (ref 3.87–5.11)
RDW: 14.6 % (ref 11.5–15.5)
WBC: 11.3 10*3/uL — ABNORMAL HIGH (ref 4.0–10.5)

## 2013-04-01 LAB — GLUCOSE, CAPILLARY
Glucose-Capillary: 78 mg/dL (ref 70–99)
Glucose-Capillary: 96 mg/dL (ref 70–99)
Glucose-Capillary: 87 mg/dL (ref 70–99)

## 2013-04-01 LAB — BASIC METABOLIC PANEL
BUN: 3 mg/dL — ABNORMAL LOW (ref 6–23)
Chloride: 111 mEq/L (ref 96–112)
Creatinine, Ser: 0.81 mg/dL (ref 0.50–1.10)
GFR calc Af Amer: 88 mL/min — ABNORMAL LOW (ref >90)
GFR calc non Af Amer: 76 mL/min — ABNORMAL LOW (ref >90)
Glucose, Bld: 76 mg/dL (ref 70–99)
Potassium: 3.7 mEq/L (ref 3.5–5.1)

## 2013-04-01 MED ORDER — ONDANSETRON HCL 4 MG/2ML IJ SOLN
4.0000 mg | INTRAMUSCULAR | Status: DC | PRN
  Administered 2013-04-01: 4 mg via INTRAVENOUS
  Filled 2013-04-01: qty 2

## 2013-04-01 MED ORDER — NYSTATIN 100000 UNIT/GM EX POWD
Freq: Two times a day (BID) | CUTANEOUS | Status: DC
  Administered 2013-04-01 – 2013-04-05 (×10): via TOPICAL
  Filled 2013-04-01: qty 15

## 2013-04-01 NOTE — Progress Notes (Addendum)
[facility] PROGRESS NOTE  [name] XBJ:[id number] DOB: 09-09-1950 DOA: 03/27/2013 PCP: [name], MD  Brief narrative:  62 y.o. female with hx of COPD on home o2 ( 2.5 L), DM, HL, osteoporosis, RA, asthma, who was recently discharged from the hospital after undergoing laparotomy for adhesions on 10/22. Post op she was found to have MRSA Bacteremia in one set of blood cultures. TTE and TEE did not show any vegetations. She was discharged on IV vancomycin to complete a 14 day course. She got a PICC line before discharge,. She returned to the ED on 11/2 with fever chills, mild abdominal discomfort and nausea, without vomiting. On arrival to ED, she was found to be febrile , hypotensive and tachycardic. Labs revealed leukocytosis. A CXR was negative for pneumonia . CT abdomen and pelvis showed pelvic abscess.  [facility] consulting on medical issues.  Assessment/Plan:  Sepsis  -due to pelvic abscesses . Drain placed by IR on 11/3  -cx growing GNR. Final sensitivity not yet available. - on empiric vancomycin and meropenem. ID following. D/c vancomycin on 11/7 . Meropenem for total 2 weeks course ( until 11/16)   Hypotension  -secondary to volume depletion and pain meds  -cont IVFs  -resumed cardizem at low dose. BP stable   Anemia  -transfused 2 units PRBCs due to soft BP readings 11/3  -Hgb up from 6.4 to 9  -baseline Hgb ~ 10-11  likely in the setting of recent sx and underlying sepsis. No active blood loss noted.   DIABETES, TYPE 2  -CBGs stable   MULTIFOCAL ATRIAL TACHYCARDIA  -recent admission for this  -HR currently controlled  -on low dose cardizem   Chronic hypoxic respiratory failure/Gold stage D. COPD  -stable - followed by [facility]  -On chronic o2 via . continue  inhalers and prn nebs   S/p recent laparotomy with LOA and ventral hernia repair  primary team following  Plan on repeat CT scan in few days  C/o nausea and noted for  abdominal distention this am. abd X ray done was unremarkable. Symptoms resolved now.  Rheumatoid arthritis  -on Plaquenil   DVT prophylaxis: sq Lovenox   Code Status: Full  Procedures:  Insertion of percutaneous drain to pelvic abscess 11/3  Disposition : per primary team  Antibiotics:  Zosyn 11/2 >>> 11/5  Meropenem (11/6>>)  Vancomycin 11/2 >>>  HPI/Subjective:   C/o nausea this am. Now resolved. abd pain better than yesterday      Objective: Filed Vitals:   04/01/13 0612  BP: 133/82  Pulse: 86  Temp: 98.2 F (36.8 C)  Resp: 18    Intake/Output Summary (Last 24 hours) at 04/01/13 1432 Last data filed at 04/01/13 0947  Gross per 24 hour  Intake   1040 ml  Output    260 ml  Net    780 ml   Filed Weights   03/27/13 1300 03/27/13 1919 03/30/13 0442  Weight: 65.6 kg (144 lb 10 oz) 58.2 kg (128 lb 4.9 oz) 57.5 kg (126 lb 12.2 oz)    Exam: General: Elderly female in NAD, appears fatigued  chest : clear b/l, no added sounds  CVS: NS1 and s2, No added sounds  Respiratory: clear b/l, no added sounds  abd: soft, laparotomy scar, ostomy in place . Pelvic drain with serosanguinous drainage, BS+,abdominal tenderness present.  Ext: warm, no edema  CNS: AAOX3   Data Reviewed: Basic Metabolic Panel:  Recent Labs Lab 03/27/13 1550  03/28/13 0500 03/29/13  1610 03/30/13 0530 03/31/13 0509 04/01/13 0527  NA  --   < > 139 142 142 143 141  K  --   < > 3.4* 3.9 3.3* 4.6 3.7  CL  --   < > 106 109 112 115* 111  CO2  --   --  24 23 23  17* 21  GLUCOSE  --   < > 110* 80 91 76 76  BUN  --   < > 6 7 5* 4* 3*  CREATININE  --   < > 1.00 1.03 0.93 0.91 0.81  CALCIUM  --   --  7.6* 7.4* 7.5* 8.0* 8.5  MG 1.6  --   --   --   --   --   --   < > = values in this interval not displayed. Liver Function Tests:  Recent Labs Lab 03/27/13 1333  AST 30  ALT 23  ALKPHOS 132*  BILITOT 0.4  PROT 5.7*  ALBUMIN 2.1*   No results found for this basename: LIPASE, AMYLASE,  in  the last 168 hours No results found for this basename: AMMONIA,  in the last 168 hours CBC:  Recent Labs Lab 03/27/13 1333 03/27/13 1559 03/28/13 0500 03/29/13 0445 03/30/13 0530 03/31/13 0509  WBC 20.7*  --  13.6* 14.7* 11.2* 9.8  NEUTROABS 18.2*  --   --   --   --   --   HGB 7.6* 7.5* 6.4* 9.2* 9.0* 9.1*  HCT 22.6* 22.0* 19.2* 26.6* 26.7* 27.5*  MCV 84.6  --  83.8 82.1 82.9 84.4  PLT 334  --  366 324 363 390   Cardiac Enzymes: No results found for this basename: CKTOTAL, CKMB, CKMBINDEX, TROPONINI,  in the last 168 hours BNP (last 3 results) No results found for this basename: PROBNP,  in the last 8760 hours CBG:  Recent Labs Lab 03/31/13 0819 03/31/13 1239 03/31/13 1724 04/01/13 0814 04/01/13 1227  GLUCAP 68* 76 86 78 96    Recent Results (from the past 240 hour(s))  URINE CULTURE     Status: None   Collection Time    03/27/13  1:53 PM      Result Value Range Status   Specimen Description URINE, CLEAN CATCH   Final   Special Requests NONE   Final   Culture  Setup Time     Final   Value: 03/27/2013 21:50     Performed at [facility] Count     Final   Value: 15,000 COLONIES/ML     Performed at [facility]   Culture     Final   Value: YEAST     Performed at [facility]   Report Status 03/28/2013 FINAL   Final  CULTURE, BLOOD (ROUTINE X 2)     Status: None   Collection Time    03/27/13  1:55 PM      Result Value Range Status   Specimen Description BLOOD PICC LINE   Final   Special Requests BOTTLES DRAWN AEROBIC AND ANAEROBIC 10CC   Final   Culture  Setup Time     Final   Value: 03/27/2013 21:48     Performed at [facility]   Culture     Final   Value:        BLOOD CULTURE RECEIVED NO GROWTH TO DATE CULTURE WILL BE HELD FOR 5 DAYS BEFORE ISSUING A FINAL NEGATIVE REPORT     Performed at [facility]   Report Status PENDING   Incomplete  CULTURE, BLOOD (ROUTINE X 2)     Status: None   Collection Time     03/27/13  3:25 PM      Result Value Range Status   Specimen Description BLOOD RIGHT HAND   Final   Special Requests     Final   Value: BOTTLES DRAWN AEROBIC AND ANAEROBIC 10CC BLUE,5CC RED   Culture  Setup Time     Final   Value: 03/27/2013 21:48     Performed at [facility]   Culture     Final   Value:        BLOOD CULTURE RECEIVED NO GROWTH TO DATE CULTURE WILL BE HELD FOR 5 DAYS BEFORE ISSUING A FINAL NEGATIVE REPORT     Performed at [facility]   Report Status PENDING   Incomplete  MRSA PCR SCREENING     Status: None   Collection Time    03/27/13  7:17 PM      Result Value Range Status   MRSA by PCR NEGATIVE  NEGATIVE Final   Comment:            The GeneXpert MRSA Assay (FDA     approved for NASAL specimens     only), is one component of a     comprehensive MRSA colonization     surveillance program. It is not     intended to diagnose MRSA     infection nor to guide or     monitor treatment for     MRSA infections.  CULTURE, ROUTINE-ABSCESS     Status: None   Collection Time    03/28/13  3:27 PM      Result Value Range Status   Specimen Description ABSCESS BUTTOCKS RIGHT   Final   Special Requests NONE   Final   Gram Stain     Final   Value: MODERATE WBC PRESENT,BOTH PMN AND MONONUCLEAR     NO SQUAMOUS EPITHELIAL CELLS SEEN     ABUNDANT GRAM NEGATIVE RODS     Performed at [facility]   Culture     Final   Value: NO GROWTH 3 DAYS     Performed at [facility]   Report Status 04/01/2013 FINAL   Final  ANAEROBIC CULTURE     Status: None   Collection Time    03/28/13  3:27 PM      Result Value Range Status   Specimen Description ABSCESS BUTTOCKS RIGHT   Final   Special Requests NONE   Final   Gram Stain PENDING   Incomplete   Culture     Final   Value: NO ANAEROBES ISOLATED; CULTURE IN PROGRESS FOR 5 DAYS     Performed at [facility]   Report Status PENDING   Incomplete     Studies: Dg Abd Portable 1v  04/01/2013    CLINICAL DATA:  Abdominal pain.  EXAM: PORTABLE ABDOMEN - 1 VIEW  COMPARISON:  None.  FINDINGS: There is a pelvic percutaneous drainage catheter present. There are embolization coils noted in the mid abdomen. There is no bowel dilatation to suggest obstruction. There is no evidence of pneumoperitoneum, portal venous gas or pneumatosis. There are no pathologic calcifications along the expected course of the ureters.The osseous structures are unremarkable.  IMPRESSION: Percutaneous pelvic drainage catheter in place. Otherwise unremarkable KUB.   Electronically Signed   By: Elige Ko   On: 04/01/2013 10:18  Scheduled Meds: . arformoterol  15 mcg Nebulization BID  . budesonide  0.25 mg Nebulization BID  . citalopram  20 mg Oral Daily  . diltiazem  30 mg Oral Q8H  . enoxaparin (LOVENOX) injection  40 mg Subcutaneous Q24H  . famotidine  20 mg Oral BID  . fluticasone  2 spray Each Nare Daily  . guaiFENesin  1,200 mg Oral BID  . hydroxychloroquine  200 mg Oral BID  . insulin aspart  0-9 Units Subcutaneous TID WC  . meropenem (MERREM) IV  1 g Intravenous Q8H  . nystatin   Topical BID  . sodium chloride  10-40 mL Intracatheter Q12H  . sodium chloride  3 mL Intravenous Q12H  . tiotropium  18 mcg Inhalation Daily  . vancomycin  500 mg Intravenous Q12H   Continuous Infusions: . sodium chloride 50 mL/hr at 04/01/13 1017      Time spent: 25 minutes    Raini Tiley  [facility] Pager [phone] If 7PM-7AM, please contact night-coverage at [email or web address], password [facility] 04/01/2013, 2:32 PM  LOS: 5 days

## 2013-04-01 NOTE — Progress Notes (Signed)
Subjective: Patient states she is doing worse today then the 2 previous days. She c/o feeling tired and was having nausea and abdominal pain earlier, however zofran and pain medication have helped a lot.    Objective: Physical Exam: BP 133/82  Pulse 86  Temp(Src) 98.2 F (36.8 C) (Oral)  Resp 18  Ht 5\' 1"  (1.549 m)  Wt 126 lb 12.2 oz (57.5 kg)  BMI 23.96 kg/m2  SpO2 100%  Abd: Soft, slightly tender, (+) BS, ostomy intact with bilous output. TG 20F drain dressing C/D/I, no signs of leaking or bleeding, output 10cc last 24 hrs serous Cx gram (-) rods, no growth   Labs: CBC  Recent Labs  03/30/13 0530 03/31/13 0509  WBC 11.2* 9.8  HGB 9.0* 9.1*  HCT 26.7* 27.5*  PLT 363 390   BMET  Recent Labs  03/30/13 0530 03/31/13 0509  NA 142 143  K 3.3* 4.6  CL 112 115*  CO2 23 17*  GLUCOSE 91 76  BUN 5* 4*  CREATININE 0.93 0.91  CALCIUM 7.5* 8.0*   LFT No results found for this basename: PROT, ALBUMIN, AST, ALT, ALKPHOS, BILITOT, BILIDIR, IBILI, LIPASE,  in the last 72 hours PT/INR No results found for this basename: LABPROT, INR,  in the last 72 hours   Studies/Results: Dg Abd Portable 1v  04/01/2013   CLINICAL DATA:  Abdominal pain.  EXAM: PORTABLE ABDOMEN - 1 VIEW  COMPARISON:  None.  FINDINGS: There is a pelvic percutaneous drainage catheter present. There are embolization coils noted in the mid abdomen. There is no bowel dilatation to suggest obstruction. There is no evidence of pneumoperitoneum, portal venous gas or pneumatosis. There are no pathologic calcifications along the expected course of the ureters.The osseous structures are unremarkable.  IMPRESSION: Percutaneous pelvic drainage catheter in place. Otherwise unremarkable KUB.   Electronically Signed   By: Elige Ko   On: 04/01/2013 10:18    Assessment/Plan: S/p Ex lap with LOA and hernia repair Pelvic abscess s/p TG 20F drain placed 11/3 output slowing down, wbc wnl, afebrile. Repeat CT 11/9 per CCS,  continue flushes TID and monitor output. All other plans per [facility]CS.   LOS: 5 days    Berneta Levins PA-C 04/01/2013 11:00 AM

## 2013-04-01 NOTE — Progress Notes (Signed)
PULMONARY  / CRITICAL CARE MEDICINE  Name: [name] MRN: [id number] DOB: 1951-01-02    ADMISSION DATE:  03/27/2013 CONSULTATION DATE:  04/01/2013   REFERRING MD :  CCS PRIMARY SERVICE:  CCS  CHIEF COMPLAINT:  abd pain  BRIEF PATIENT DESCRIPTION:  62 y.o.AAF well known to me, Gold D Copd /AB oxygen dependent.  S/p exlap for adhesions/ventral hernia in October 2014, assoc MRSA bacteremia without SBE. Returned 03/27/13 with pelvic abscess with GNR on IR drain.  Pt improved now.    SIGNIFICANT EVENTS / STUDIES:  IR abscess drain 11/3 pelvis   LINES / TUBES: PICC RUE 10/28 IR pelvis drain 11/3   CULTURES: 11/2 UC >>15K yeast 11/2 BCx 2>>NGTD 11/3 pelvis drainage>>GNR on gm stain>>NGTD>>  ANTIBIOTICS: PER ID  Zosyn 11/3>>11/6 Merrum 11/6>> Vanco 02/2013>>  SUBJECTIVE:  Less dyspneic, coughing up mucus  VITAL SIGNS: Temp:  [98.2 F (36.8 C)-99.1 F (37.3 C)] 98.2 F (36.8 C) (11/07 0612) Pulse Rate:  [86-94] 86 (11/07 0612) Resp:  [18-20] 18 (11/07 0612) BP: (95-133)/(54-82) 133/82 mmHg (11/07 0612) SpO2:  [94 %-100 %] 100 % (11/07 0612)  PHYSICAL EXAMINATION: General:  Ill appearing in NAD Neuro:  Intact, non focal HEENT:  Moist mucus membranes Neck:  supple Cardiovascular:  RRR nl s1 s2 no s3 s4 Lungs:  Distant BS, no rhonchi Abdomen:  Tender but less so, BSA, drain and ostomy in place Musculoskeletal:  from Skin:  clear   Recent Labs Lab 03/29/13 0445 03/30/13 0530 03/31/13 0509  NA 142 142 143  K 3.9 3.3* 4.6  CL 109 112 115*  CO2 23 23 17*  BUN 7 5* 4*  CREATININE 1.03 0.93 0.91  GLUCOSE 80 91 76    Recent Labs Lab 03/29/13 0445 03/30/13 0530 03/31/13 0509  HGB 9.2* 9.0* 9.1*  HCT 26.6* 26.7* 27.5*  WBC 14.7* 11.2* 9.8  PLT 324 363 390   No results found.  ASSESSMENT / PLAN:  #1 GOLD D Copd with AB. No acute flare, assoc chronic resp failure, oxygen dependent.    Plan   Cont neb meds as rx    Cont spiriva   Cont  oxygen   Cont  IS and flutter valve   apprec TRH care and CCS   Cont  to mobilize   #2 Pelvic abscess s/p IR drain s/p prior ex lap for adhesions for bowel obstruction and ventral hernia repair  Plan   Per CCS, IR, ID   Cont curr ABX agree with same.  #3 MRSA bactermia no SBE  Plan   Per ID  #4 Severe protein cal malnutrition,   Plan   Adv diet as tolerated  I will chk on pt PRN this weekend, call if needed.    Dorcas Carrow Beeper  [phone]  Cell  [phone]  If no response or cell goes to voicemail, call beeper [phone]  Pulmonary and Critical Care Medicine [facility] Pager: [phone]  04/01/2013, 8:10 AM

## 2013-04-01 NOTE — Progress Notes (Signed)
ANTIBIOTIC CONSULT NOTE - FOLLOW UP  Pharmacy Consult for vancomycin Indication: intrabdominal abscess  Allergies  Allergen Reactions  . Esomeprazole Magnesium Other (See Comments)    NEXIUM - reaction > aggravated pt's Crohn's disease  . Other     Beans, Dander/Dust, Peas, Mushrooms  . Peanut-Containing Drug Products Other (See Comments)    unknown  . Shellfish Allergy Other (See Comments)    unknown  . Surgical Lubricant Other (See Comments)    Burns skin     Patient Measurements: Height: 5\' 1"  (154.9 cm) Weight: 126 lb 12.2 oz (57.5 kg) IBW/kg (Calculated) : 47.8   Vital Signs: Temp: 98.2 F (36.8 C) (11/07 0612) Temp src: Oral (11/07 0612) BP: 133/82 mmHg (11/07 0612) Pulse Rate: 86 (11/07 0612) Intake/Output from previous day: 11/06 0701 - 11/07 0700 In: 1170 [P.O.:120; I.V.:800; IV Piggyback:250] Out: 310 [Drains:10; Stool:300] Intake/Output from this shift: Total I/O In: 240 [P.O.:240] Out: 250 [Stool:250]  Labs:  Recent Labs  03/30/13 0530 03/31/13 0509  WBC 11.2* 9.8  HGB 9.0* 9.1*  PLT 363 390  CREATININE 0.93 0.91   Estimated Creatinine Clearance: 51.6 ml/min (by C-G formula based on Cr of 0.91).  Recent Labs  03/30/13 1430  VANCOTROUGH 15.5     Assessment: 32 YOF who recently underwent laparotomy for adhesions and was discharged on IV vancomycin for MRSA bacteremia. She was readmitted with intra-abdominal abscess. Renal function has remained stable. Cx pending. ID following.  Goal of Therapy:  Vancomycin trough level 15-20 mcg/ml  Plan:  1. Continue vancomycin 500mg  IV q12h through 11/7 per ID  2. Continue meropenem 1 gm Q 8 hours per physician orders  3. Will follow renal function and clinical progression  Vinnie Level, PharmD.  [place] License #[id number] Application for [place] reciprocity pending  Clinical Pharmacist Pager [phone]

## 2013-04-01 NOTE — Progress Notes (Signed)
I have seen and examined the patient and agree with the assessment and plans.  Vir Whetstine A. Byard Carranza  MD, FACS  

## 2013-04-01 NOTE — Progress Notes (Signed)
Physical Therapy Treatment Patient Details Name: [name] MRN: [id number] DOB: 04-28-1951 Today's Date: 04/01/2013 Time: 9629-5284 PT Time Calculation (min): 33 min  PT Assessment / Plan / Recommendation  History of Present Illness Patient is a 62 year old female with a history of Crohn's colitis, status post total abdominal colectomy and end ileostomy at [facility] in 2007.  She recently presented with acute small bowel obstruction. Pt underwent REPAIR OF INCISIONAL AND PARASTOMAL HERNIAS, LYSIS OF ADHESIONS FOR SMALL BOWEL OBSTRUCTION and INSERTION OF BIOLOGIC MESH. Pt presents this admit for    PT Comments   Pt. Initially grimacing and tearful over how bad she feels and grieving her loss of mobility at present time.  Refocused pt. To concentrate on what she can do positive today, including her exercises that she tolerated well and improving her effort at IS.  She was at 500 with RN just prior to PT but was able to attain 1000 with increased activity/exercise.  Pt. Smiling and in improved state at end of session.  Encouraged her to continue her efforts over the weekend, including use of IS, exercises and OOB with assist.  Follow Up Recommendations  SNF;Supervision/Assistance - 24 hour     Does the patient have the potential to tolerate intense rehabilitation     Barriers to Discharge        Equipment Recommendations  None recommended by PT    Recommendations for Other Services    Frequency Min 3X/week   Progress towards PT Goals Progress towards PT goals: Progressing toward goals  Plan Current plan remains appropriate    Precautions / Restrictions Precautions Precautions: Other (comment) (drains) Restrictions Weight Bearing Restrictions: No   Pertinent Vitals/Pain See vitals tab     Mobility  Bed Mobility Bed Mobility: Scooting to HOB;Rolling Right Rolling Right: 5: Supervision;With rail Scooting to [facility]: 3: Mod assist;With rail Details for Bed Mobility Assistance: vc's  for technique and sequencing.  Pt. able to assist herself in scooting to [facility] by using LEs and L UE on rail, therapist used bed pad to assist Transfers Transfers: Not assessed (pt. declined) Ambulation/Gait Ambulation/Gait Assistance: Not tested (comment);Other (comment) (pt. declined)    Exercises General Exercises - Lower Extremity Ankle Circles/Pumps: AROM;Both;10 reps;Supine Quad Sets: AROM;Both;10 reps;Supine Gluteal Sets: AROM;Both;10 reps;Supine Short Arc Quad: AROM;Both;15 reps;Supine Heel Slides: AROM;Both;10 reps;Supine Hip ABduction/ADduction: AROM;Both;10 reps;Supine   PT Diagnosis:    PT Problem List:   PT Treatment Interventions:     PT Goals (current goals can now be found in the care plan section)    Visit Information  Last PT Received On: 04/01/13 Assistance Needed: +1 History of Present Illness: Patient is a 62 year old female with a history of Crohn's colitis, status post total abdominal colectomy and end ileostomy at [facility] in 2007.  She recently presented with acute small bowel obstruction. Pt underwent REPAIR OF INCISIONAL AND PARASTOMAL HERNIAS, LYSIS OF ADHESIONS FOR SMALL BOWEL OBSTRUCTION and INSERTION OF BIOLOGIC MESH. Pt presents this admit for     Subjective Data  Subjective: Continues to c/o being cold and wants to stay covered up   Cognition  Cognition Arousal/Alertness: Awake/alert Behavior During Therapy: [facility] for tasks assessed/performed Overall Cognitive Status: Within Functional Limits for tasks assessed    Balance     End of Session PT - End of Session Activity Tolerance: Patient tolerated treatment well Patient left: in bed;with call bell/phone within reach Nurse Communication: Mobility status;Other (comment) (improvement in using IS up to 1000)   GP  [name] 04/01/2013, 3:34 PM Weldon Picking PT Acute Rehab Services [phone] Beeper [phone]

## 2013-04-01 NOTE — Progress Notes (Signed)
Patient complained of tenderness and soreness around vaginal area. Upon assessment surrounding skin appeared excoriated and redness. Pt indicated one particular area, which appears as a macule. Pt stated it feels as if pus is collecting in one particular area.

## 2013-04-01 NOTE — Progress Notes (Signed)
Patient ID: [name], female   DOB: 1950/06/28, 62 y.o.   MRN: [id number]    Subjective: Pt doesn't feel as well today.  Having an acute onset of nausea this morning.  Unclear cause.  Having some increase in abdominal pain.  C/o irritation of perineum and possible boil on right labia  Objective: Vital signs in last 24 hours: Temp:  [98.2 F (36.8 C)-99.1 F (37.3 C)] 98.2 F (36.8 C) (11/07 0612) Pulse Rate:  [86-94] 86 (11/07 0612) Resp:  [18-20] 18 (11/07 0612) BP: (95-133)/(54-82) 133/82 mmHg (11/07 0612) SpO2:  [94 %-100 %] 100 % (11/07 0612) Last BM Date: 03/31/13  Intake/Output from previous day: 11/06 0701 - 11/07 0700 In: 1170 [P.O.:120; I.V.:800; IV Piggyback:250] Out: 310 [Drains:10; Stool:300] Intake/Output this shift:    PE: Abd: soft, but seems a bit more distended today.  There is an area to the left of her midline incision c/w the fluid collection seen on CT scan that is bulging some and tender, +BS, ostomy with liquid bilious output.  Stoma is prolapsed some, no evidence of ischemia.  Unable to reduce it as patient has a one-piece bag on and didn't take it off at the moment. (states it has been this way since she came back in the hospital this time)  Perc drain with serous output GU: excoriation and erythema noted on medial thighs.  Right labia is slightly larger than left, but very soft and no evidence of abscess or Bartholin's cyst.  Lab Results:   Recent Labs  03/30/13 0530 03/31/13 0509  WBC 11.2* 9.8  HGB 9.0* 9.1*  HCT 26.7* 27.5*  PLT 363 390   BMET  Recent Labs  03/30/13 0530 03/31/13 0509  NA 142 143  K 3.3* 4.6  CL 112 115*  CO2 23 17*  GLUCOSE 91 76  BUN 5* 4*  CREATININE 0.93 0.91  CALCIUM 7.5* 8.0*   PT/INR No results found for this basename: LABPROT, INR,  in the last 72 hours CMP     Component Value Date/Time   NA 143 03/31/2013 0509   K 4.6 03/31/2013 0509   CL 115* 03/31/2013 0509   CO2 17* 03/31/2013 0509   GLUCOSE  76 03/31/2013 0509   BUN 4* 03/31/2013 0509   CREATININE 0.91 03/31/2013 0509   CREATININE 1.04 08/27/2012 1054   CALCIUM 8.0* 03/31/2013 0509   CALCIUM 9.0 07/15/2011 0920   PROT 5.7* 03/27/2013 1333   ALBUMIN 2.1* 03/27/2013 1333   AST 30 03/27/2013 1333   ALT 23 03/27/2013 1333   ALKPHOS 132* 03/27/2013 1333   BILITOT 0.4 03/27/2013 1333   GFRNONAA 66* 03/31/2013 0509   GFRAA 76* 03/31/2013 0509   Lipase     Component Value Date/Time   LIPASE 23 03/14/2013 0940       Studies/Results: No results found.  Anti-infectives: Anti-infectives   Start     Dose/Rate Route Frequency Ordered Stop   03/31/13 1400  meropenem (MERREM) 1 g in sodium chloride 0.9 % 100 mL IVPB     1 g 200 mL/hr over 30 Minutes Intravenous 3 times per day 03/31/13 1231     03/31/13 0700  vancomycin (VANCOCIN) 500 mg in sodium chloride 0.9 % 100 mL IVPB     500 mg 100 mL/hr over 60 Minutes Intravenous Every 12 hours 03/31/13 0628 04/01/13 2359   03/27/13 2200  piperacillin-tazobactam (ZOSYN) IVPB 3.375 g  Status:  Discontinued     3.375 g 12.5 mL/hr over 240 Minutes  Intravenous 3 times per day 03/27/13 1530 03/31/13 1204   03/27/13 2200  hydroxychloroquine (PLAQUENIL) tablet 200 mg     200 mg Oral 2 times daily 03/27/13 1915     03/27/13 1430  piperacillin-tazobactam (ZOSYN) IVPB 3.375 g     3.375 g 100 mL/hr over 30 Minutes Intravenous  Once 03/27/13 1415 03/27/13 1514   03/27/13 0300  vancomycin (VANCOCIN) 500 mg in sodium chloride 0.9 % 100 mL IVPB  Status:  Discontinued     500 mg 100 mL/hr over 60 Minutes Intravenous Every 12 hours 03/27/13 2351 03/31/13 0628   03/27/13 0100  vancomycin (VANCOCIN) 500 mg in sodium chloride 0.9 % 100 mL IVPB  Status:  Discontinued     500 mg 100 mL/hr over 60 Minutes Intravenous Every 12 hours 03/27/13 2227 03/27/13 2340       Assessment/Plan  1. S/p ex lap with LOA, ventral and peristomal hernia repair with biological mesh 2. Intra-abdominal abscess, s/p perc darin  3  midline fluid collection 4. Nausea 5. COPD 6. DM 7. HTN  Plan: 1. zofran added for nausea control.  The patient does seem a little more distended today.  Will get a port 1 view abdominal x-ray to make sure everything looks ok, given patient's c/o not feeling well today 2. Check labs 3. Nystatin powder to inguinal region  4. Slightly prolapsed stoma.  No evidence of ischemia or obstruction as her bag still has output, albeit liquid today with no solid output.  When another bag is available, we may need to remove this one and try to digitalize her stoma and see if this can be reduced. 5. Appreciate medicine and CCM's assistance. 6. ? If midline fluid collection needs to be aspirated or opened up? 7. Cont with JP drain, repeat scan on Sunday  LOS: 5 days    [name] 04/01/2013, 8:25 AM Pager: [phone]

## 2013-04-01 NOTE — Progress Notes (Signed)
CSW received additional consult for nursing home placement for d/c planning.   Pt has already been assessed on 03/29/2013 and CSW is following for d/c planning.   Pt has been given offers and is aware of private room bed offers for d/c planning.   CSW will continue to follow Pt for d/c planning.   Leron Croak  MSW, LCSWA  [facility]

## 2013-04-01 NOTE — Progress Notes (Signed)
[facility]       Subjective: No new complaints   Antibiotics:  Anti-infectives   Start     Dose/Rate Route Frequency Ordered Stop   03/31/13 1400  meropenem (MERREM) 1 g in sodium chloride 0.9 % 100 mL IVPB     1 g 200 mL/hr over 30 Minutes Intravenous 3 times per day 03/31/13 1231     03/31/13 0700  vancomycin (VANCOCIN) 500 mg in sodium chloride 0.9 % 100 mL IVPB     500 mg 100 mL/hr over 60 Minutes Intravenous Every 12 hours 03/31/13 0628 04/01/13 2359   03/27/13 2200  piperacillin-tazobactam (ZOSYN) IVPB 3.375 g  Status:  Discontinued     3.375 g 12.5 mL/hr over 240 Minutes Intravenous 3 times per day 03/27/13 1530 03/31/13 1204   03/27/13 2200  hydroxychloroquine (PLAQUENIL) tablet 200 mg     200 mg Oral 2 times daily 03/27/13 1915     03/27/13 1430  piperacillin-tazobactam (ZOSYN) IVPB 3.375 g     3.375 g 100 mL/hr over 30 Minutes Intravenous  Once 03/27/13 1415 03/27/13 1514   03/27/13 0300  vancomycin (VANCOCIN) 500 mg in sodium chloride 0.9 % 100 mL IVPB  Status:  Discontinued     500 mg 100 mL/hr over 60 Minutes Intravenous Every 12 hours 03/27/13 2351 03/31/13 0628   03/27/13 0100  vancomycin (VANCOCIN) 500 mg in sodium chloride 0.9 % 100 mL IVPB  Status:  Discontinued     500 mg 100 mL/hr over 60 Minutes Intravenous Every 12 hours 03/27/13 2227 03/27/13 2340      Medications: Scheduled Meds: . arformoterol  15 mcg Nebulization BID  . budesonide  0.25 mg Nebulization BID  . citalopram  20 mg Oral Daily  . diltiazem  30 mg Oral Q8H  . enoxaparin (LOVENOX) injection  40 mg Subcutaneous Q24H  . famotidine  20 mg Oral BID  . fluticasone  2 spray Each Nare Daily  . guaiFENesin  1,200 mg Oral BID  . hydroxychloroquine  200 mg Oral BID  . insulin aspart  0-9 Units Subcutaneous TID WC  . meropenem (MERREM) IV  1 g Intravenous Q8H  . nystatin   Topical BID  . sodium chloride  10-40 mL Intracatheter Q12H  . sodium chloride  3 mL  Intravenous Q12H  . tiotropium  18 mcg Inhalation Daily  . vancomycin  500 mg Intravenous Q12H   Continuous Infusions: . sodium chloride 50 mL/hr at 04/01/13 1017   PRN Meds:.acetaminophen, HYDROcodone-acetaminophen, iohexol, LORazepam, morphine injection, ondansetron (ZOFRAN) IV, sodium chloride   Objective: Weight change:   Intake/Output Summary (Last 24 hours) at 04/01/13 1711 Last data filed at 04/01/13 1300  Gross per 24 hour  Intake   1280 ml  Output    260 ml  Net   1020 ml   Blood pressure 128/74, pulse 92, temperature 98.7 F (37.1 C), temperature source Oral, resp. rate 18, height 5\' 1"  (1.549 m), weight 126 lb 12.2 oz (57.5 kg), SpO2 100.00%. Temp:  [98.2 F (36.8 C)-98.7 F (37.1 C)] 98.7 F (37.1 C) (11/07 1433) Pulse Rate:  [86-92] 92 (11/07 1433) Resp:  [18-20] 18 (11/07 1433) BP: (95-133)/(74-82) 128/74 mmHg (11/07 1433) SpO2:  [100 %] 100 % (11/07 1433)  Physical Exam: General: Alert and awake, oriented x3, not in any acute distress.  HEENT: anicteric sclera, pupils reactive to light and accommodation, EOMI, oropharynx clear and without exudate  CVS regular rate, normal r, no murmur  rubs or gallops  Chest: clear to auscultation bilaterally, no wheezing, rales or rhonchi  Abdomen: dressings in place, JP drain with hazy material in place, incisicsion site is clean Extremities: no clubbing or edema noted bilaterally  Skin: no rashes  Neuro: nonfocal,  Lab Results:  Recent Labs  03/31/13 0509 04/01/13 1300  WBC 9.8 11.3*  HGB 9.1* 8.6*  HCT 27.5* 26.2*  PLT 390 392    BMET  Recent Labs  03/31/13 0509 04/01/13 0527  NA 143 141  K 4.6 3.7  CL 115* 111  CO2 17* 21  GLUCOSE 76 76  BUN 4* 3*  CREATININE 0.91 0.81  CALCIUM 8.0* 8.5    Micro Results: Recent Results (from the past 240 hour(s))  URINE CULTURE     Status: None   Collection Time    03/27/13  1:53 PM      Result Value Range Status   Specimen Description URINE, CLEAN CATCH    Final   Special Requests NONE   Final   Culture  Setup Time     Final   Value: 03/27/2013 21:50     Performed at [facility] Count     Final   Value: 15,000 COLONIES/ML     Performed at [facility]   Culture     Final   Value: YEAST     Performed at [facility]   Report Status 03/28/2013 FINAL   Final  CULTURE, BLOOD (ROUTINE X 2)     Status: None   Collection Time    03/27/13  1:55 PM      Result Value Range Status   Specimen Description BLOOD PICC LINE   Final   Special Requests BOTTLES DRAWN AEROBIC AND ANAEROBIC 10CC   Final   Culture  Setup Time     Final   Value: 03/27/2013 21:48     Performed at [facility]   Culture     Final   Value:        BLOOD CULTURE RECEIVED NO GROWTH TO DATE CULTURE WILL BE HELD FOR 5 DAYS BEFORE ISSUING A FINAL NEGATIVE REPORT     Performed at [facility]   Report Status PENDING   Incomplete  CULTURE, BLOOD (ROUTINE X 2)     Status: None   Collection Time    03/27/13  3:25 PM      Result Value Range Status   Specimen Description BLOOD RIGHT HAND   Final   Special Requests     Final   Value: BOTTLES DRAWN AEROBIC AND ANAEROBIC 10CC BLUE,5CC RED   Culture  Setup Time     Final   Value: 03/27/2013 21:48     Performed at [facility]   Culture     Final   Value:        BLOOD CULTURE RECEIVED NO GROWTH TO DATE CULTURE WILL BE HELD FOR 5 DAYS BEFORE ISSUING A FINAL NEGATIVE REPORT     Performed at [facility]   Report Status PENDING   Incomplete  MRSA PCR SCREENING     Status: None   Collection Time    03/27/13  7:17 PM      Result Value Range Status   MRSA by PCR NEGATIVE  NEGATIVE Final   Comment:            The GeneXpert MRSA Assay (FDA     approved for NASAL specimens  only), is one component of a     comprehensive MRSA colonization     surveillance program. It is not     intended to diagnose MRSA     infection nor to guide or     monitor treatment for     MRSA  infections.  CULTURE, ROUTINE-ABSCESS     Status: None   Collection Time    03/28/13  3:27 PM      Result Value Range Status   Specimen Description ABSCESS BUTTOCKS RIGHT   Final   Special Requests NONE   Final   Gram Stain     Final   Value: MODERATE WBC PRESENT,BOTH PMN AND MONONUCLEAR     NO SQUAMOUS EPITHELIAL CELLS SEEN     ABUNDANT GRAM NEGATIVE RODS     Performed at [facility]   Culture     Final   Value: NO GROWTH 3 DAYS     Performed at [facility]   Report Status 04/01/2013 FINAL   Final  ANAEROBIC CULTURE     Status: None   Collection Time    03/28/13  3:27 PM      Result Value Range Status   Specimen Description ABSCESS BUTTOCKS RIGHT   Final   Special Requests NONE   Final   Gram Stain PENDING   Incomplete   Culture     Final   Value: NO ANAEROBES ISOLATED; CULTURE IN PROGRESS FOR 5 DAYS     Performed at [facility]   Report Status PENDING   Incomplete    Studies/Results: Dg Abd Portable 1v  04/01/2013   CLINICAL DATA:  Abdominal pain.  EXAM: PORTABLE ABDOMEN - 1 VIEW  COMPARISON:  None.  FINDINGS: There is a pelvic percutaneous drainage catheter present. There are embolization coils noted in the mid abdomen. There is no bowel dilatation to suggest obstruction. There is no evidence of pneumoperitoneum, portal venous gas or pneumatosis. There are no pathologic calcifications along the expected course of the ureters.The osseous structures are unremarkable.  IMPRESSION: Percutaneous pelvic drainage catheter in place. Otherwise unremarkable KUB.   Electronically Signed   By: Elige Ko   On: 04/01/2013 10:18      Assessment/Plan: [name] is a 62 y.o. female with   Crohn's disease diabetes mellitus recently discharged from the hospital after undergoing laparotomy for adhesions on 10/22. Post op she was found to have MRSA Bacteremia in one set of blood cultures. TTE and TEE did not show any vegetations. She was discharged on IV  vancomycin, now readmitted with intra-abdominal abscess currently on vancomycin and Zosyn with plans for IR drainage yesterday with GNR growing on culture  #1 Intrabdominal abscess:   - I changed to meropenem  dosed q 8 hours for SNF,   I would not be opposed to oral abx but only problem is that I dont know what the identity or sensitivity of the large  amount of GNR seen on GS that never have grown might have been.   Presumably they were sensitive to zosyn but I really cant know if they would have also been s to levaquin, bactrim etc  My preference would be to play it on aggressive side with IV anti-pseudomonal carbapenem so there would be low chance of abx failure, esp since she has been readmitted multiple times already. She does already have a PICC as well placed for her rx for MRSA bacteremia   I would make sure she gets at  least 14 days of therapy with repeat imaging of abdomen in the interval   #2 MRSA Bacteremia: no vegetations on TEE  DC vancomycin after todays doses  Dr. Orvan Falconer available on the weekend for questions.      LOS: 5 days   Acey Lav 04/01/2013, 5:11 PM

## 2013-04-01 NOTE — Progress Notes (Signed)
Seen and agreed [name], Pharm.D. [phone] 04/01/2013 2:30 PM

## 2013-04-01 NOTE — Telephone Encounter (Signed)
i have been rounding on this pt all week.

## 2013-04-01 NOTE — Progress Notes (Signed)
Came to visit with [name] at bedside. She is active with [facility] Care Management services. Patient was resting when this writer came to visit. She opened eyes up briefly to say hi. Otherwise, will continue to follow. Inpatient RNCM aware of [facility] Care Management following. Appears disposition likely SNF.  [name], MSN-Ed, RN,BSN- [facility] [phone]

## 2013-04-02 DIAGNOSIS — D649 Anemia, unspecified: Secondary | ICD-10-CM

## 2013-04-02 DIAGNOSIS — E876 Hypokalemia: Secondary | ICD-10-CM

## 2013-04-02 LAB — GLUCOSE, CAPILLARY
Glucose-Capillary: 73 mg/dL (ref 70–99)
Glucose-Capillary: 102 mg/dL — ABNORMAL HIGH (ref 70–99)

## 2013-04-02 LAB — ANAEROBIC CULTURE

## 2013-04-02 LAB — CBC
MCHC: 32.8 g/dL (ref 30.0–36.0)
Platelets: 406 10*3/uL — ABNORMAL HIGH (ref 150–400)
RDW: 14.5 % (ref 11.5–15.5)

## 2013-04-02 LAB — CULTURE, BLOOD (ROUTINE X 2)
Culture: NO GROWTH
Culture: NO GROWTH

## 2013-04-02 LAB — BASIC METABOLIC PANEL
CO2: 23 mEq/L (ref 19–32)
GFR calc Af Amer: 89 mL/min — ABNORMAL LOW (ref >90)
GFR calc non Af Amer: 77 mL/min — ABNORMAL LOW (ref >90)
Potassium: 3.3 mEq/L — ABNORMAL LOW (ref 3.5–5.1)
Sodium: 142 mEq/L (ref 135–145)

## 2013-04-02 MED ORDER — POTASSIUM CHLORIDE CRYS ER 20 MEQ PO TBCR
40.0000 meq | EXTENDED_RELEASE_TABLET | Freq: Once | ORAL | Status: DC
  Filled 2013-04-02: qty 2

## 2013-04-02 MED ORDER — POTASSIUM CHLORIDE 10 MEQ/100ML IV SOLN
10.0000 meq | INTRAVENOUS | Status: AC
  Administered 2013-04-02 (×3): 10 meq via INTRAVENOUS
  Filled 2013-04-02 (×3): qty 100

## 2013-04-02 NOTE — Progress Notes (Addendum)
[facility] PROGRESS NOTE  [name] NFA:[id number] DOB: 08/21/1950 DOA: 03/27/2013 PCP: [name], MD  Brief narrative:  62 y.o. female with hx of COPD on home o2 ( 2.5 L), DM, HL, osteoporosis, RA, asthma, who was recently discharged from the hospital after undergoing laparotomy for adhesions on 10/22. Post op she was found to have MRSA Bacteremia in one set of blood cultures. TTE and TEE did not show any vegetations. She was discharged on IV vancomycin to complete a 14 day course. She got a PICC line before discharge,. She returned to the ED on 11/2 with fever chills, mild abdominal discomfort and nausea, without vomiting. On arrival to ED, she was found to be febrile , hypotensive and tachycardic. Labs revealed leukocytosis. A CXR was negative for pneumonia . CT abdomen and pelvis showed pelvic abscess.  [facility] consulting on medical issues.    Assessment/Plan:  Sepsis  -due to pelvic abscesses . Drain placed by IR on 11/3  -cx growing GNR. ( moderate bacteroides and beta lactamase pos). sensitivity pending - on empiric vancomycin and meropenem. ID following. D/c vancomycin on 11/7 . Meropenem for total 2 weeks course ( until 11/16)   Hypotension  -secondary to volume depletion and pain meds  -cont IVFs  -resumed cardizem at low dose. BP stable   Anemia  -transfused 2 units PRBCs due to soft BP readings 11/3  -hb current in 8s -baseline Hgb ~ 10-11  likely in the setting of recent sx and underlying sepsis. No active blood loss noted.   DIABETES, TYPE 2  -CBGs stable   MULTIFOCAL ATRIAL TACHYCARDIA  -recent admission for this  -HR currently controlled  -on low dose cardizem   Chronic hypoxic respiratory failure/Gold stage D. COPD  -stable - followed by [facility]  -On chronic o2 via [place]. continue inhalers and prn nebs   S/p recent laparotomy with LOA and ventral hernia repair  primary team following  Plan on repeat CT scan  tomorrow -still has abdominal pain and tenderness. N/V resolved.  Hypokalemia   relpenished  Rheumatoid arthritis  -on Plaquenil   DVT prophylaxis: sq Lovenox   Code Status: Full   Procedures:  Insertion of percutaneous drain to pelvic abscess 11/3   Disposition : per primary team   Antibiotics:  Zosyn 11/2 >>> 11/5  Meropenem (11/6>>)  Vancomycin 11/2 >>>   HPI/Subjective:  Still has abdominal pain. No N/V today     Objective: Filed Vitals:   04/02/13 0621  BP: 140/78  Pulse: 88  Temp: 98.1 F (36.7 C)  Resp: 20    Intake/Output Summary (Last 24 hours) at 04/02/13 1539 Last data filed at 04/02/13 0634  Gross per 24 hour  Intake 1498.5 ml  Output    310 ml  Net 1188.5 ml   Filed Weights   03/27/13 1300 03/27/13 1919 03/30/13 0442  Weight: 65.6 kg (144 lb 10 oz) 58.2 kg (128 lb 4.9 oz) 57.5 kg (126 lb 12.2 oz)    Exam: General: Elderly female in NAD, appears fatigued  chest : clear b/l, no added sounds  CVS: NS1 and s2, No added sounds  Respiratory: clear b/l, no added sounds  abd: soft, laparotomy scar, ostomy in place . Pelvic drain with serosanguinous drainage, BS+,abdominal tenderness present.  Ext: warm, no edema  CNS: AAOX3  Data Reviewed: Basic Metabolic Panel:  Recent Labs Lab 03/27/13 1550  03/29/13 0445 03/30/13 0530 03/31/13 0509 04/01/13 0527 04/02/13 0500  NA  --   < >  142 142 143 141 142  K  --   < > 3.9 3.3* 4.6 3.7 3.3*  CL  --   < > 109 112 115* 111 111  CO2  --   < > 23 23 17* 21 23  GLUCOSE  --   < > 80 91 76 76 82  BUN  --   < > 7 5* 4* 3* 3*  CREATININE  --   < > 1.03 0.93 0.91 0.81 0.80  CALCIUM  --   < > 7.4* 7.5* 8.0* 8.5 8.4  MG 1.6  --   --   --   --   --   --   < > = values in this interval not displayed. Liver Function Tests:  Recent Labs Lab 03/27/13 1333  AST 30  ALT 23  ALKPHOS 132*  BILITOT 0.4  PROT 5.7*  ALBUMIN 2.1*   No results found for this basename: LIPASE, AMYLASE,  in the last 168  hours No results found for this basename: AMMONIA,  in the last 168 hours CBC:  Recent Labs Lab 03/27/13 1333  03/29/13 0445 03/30/13 0530 03/31/13 0509 04/01/13 1300 04/02/13 0500  WBC 20.7*  < > 14.7* 11.2* 9.8 11.3* 11.6*  NEUTROABS 18.2*  --   --   --   --   --   --   HGB 7.6*  < > 9.2* 9.0* 9.1* 8.6* 8.4*  HCT 22.6*  < > 26.6* 26.7* 27.5* 26.2* 25.6*  MCV 84.6  < > 82.1 82.9 84.4 84.5 83.9  PLT 334  < > 324 363 390 392 406*  < > = values in this interval not displayed. Cardiac Enzymes: No results found for this basename: CKTOTAL, CKMB, CKMBINDEX, TROPONINI,  in the last 168 hours BNP (last 3 results) No results found for this basename: PROBNP,  in the last 8760 hours CBG:  Recent Labs Lab 04/01/13 0814 04/01/13 1227 04/01/13 1706 04/02/13 0819 04/02/13 1201  GLUCAP 78 96 87 73 102*    Recent Results (from the past 240 hour(s))  URINE CULTURE     Status: None   Collection Time    03/27/13  1:53 PM      Result Value Range Status   Specimen Description URINE, CLEAN CATCH   Final   Special Requests NONE   Final   Culture  Setup Time     Final   Value: 03/27/2013 21:50     Performed at [facility] Count     Final   Value: 15,000 COLONIES/ML     Performed at [facility]   Culture     Final   Value: YEAST     Performed at [facility]   Report Status 03/28/2013 FINAL   Final  CULTURE, BLOOD (ROUTINE X 2)     Status: None   Collection Time    03/27/13  1:55 PM      Result Value Range Status   Specimen Description BLOOD PICC LINE   Final   Special Requests BOTTLES DRAWN AEROBIC AND ANAEROBIC 10CC   Final   Culture  Setup Time     Final   Value: 03/27/2013 21:48     Performed at [facility]   Culture     Final   Value: NO GROWTH 5 DAYS     Performed at [facility]   Report Status 04/02/2013 FINAL   Final  CULTURE, BLOOD (ROUTINE X 2)  Status: None   Collection Time    03/27/13  3:25 PM      Result  Value Range Status   Specimen Description BLOOD RIGHT HAND   Final   Special Requests     Final   Value: BOTTLES DRAWN AEROBIC AND ANAEROBIC 10CC BLUE,5CC RED   Culture  Setup Time     Final   Value: 03/27/2013 21:48     Performed at [facility]   Culture     Final   Value: NO GROWTH 5 DAYS     Performed at [facility]   Report Status 04/02/2013 FINAL   Final  MRSA PCR SCREENING     Status: None   Collection Time    03/27/13  7:17 PM      Result Value Range Status   MRSA by PCR NEGATIVE  NEGATIVE Final   Comment:            The GeneXpert MRSA Assay (FDA     approved for NASAL specimens     only), is one component of a     comprehensive MRSA colonization     surveillance program. It is not     intended to diagnose MRSA     infection nor to guide or     monitor treatment for     MRSA infections.  CULTURE, ROUTINE-ABSCESS     Status: None   Collection Time    03/28/13  3:27 PM      Result Value Range Status   Specimen Description ABSCESS BUTTOCKS RIGHT   Final   Special Requests NONE   Final   Gram Stain     Final   Value: MODERATE WBC PRESENT,BOTH PMN AND MONONUCLEAR     NO SQUAMOUS EPITHELIAL CELLS SEEN     ABUNDANT GRAM NEGATIVE RODS     Performed at [facility]   Culture     Final   Value: NO GROWTH 3 DAYS     Performed at [facility]   Report Status 04/01/2013 FINAL   Final  ANAEROBIC CULTURE     Status: None   Collection Time    03/28/13  3:27 PM      Result Value Range Status   Specimen Description ABSCESS BUTTOCKS RIGHT   Final   Special Requests NONE   Final   Gram Stain     Final   Value: MODERATE WBC PRESENT,BOTH PMN AND MONONUCLEAR     NO SQUAMOUS EPITHELIAL CELLS SEEN     ABUNDANT GRAM NEGATIVE RODS     Performed at [facility]   Culture     Final   Value: MODERATE BACTEROIDES SPECIES     Note: BETA LACTAMASE POSITIVE     Performed at [facility]   Report Status 04/02/2013 FINAL   Final      Studies: Dg Abd Portable 1v  04/01/2013   CLINICAL DATA:  Abdominal pain.  EXAM: PORTABLE ABDOMEN - 1 VIEW  COMPARISON:  None.  FINDINGS: There is a pelvic percutaneous drainage catheter present. There are embolization coils noted in the mid abdomen. There is no bowel dilatation to suggest obstruction. There is no evidence of pneumoperitoneum, portal venous gas or pneumatosis. There are no pathologic calcifications along the expected course of the ureters.The osseous structures are unremarkable.  IMPRESSION: Percutaneous pelvic drainage catheter in place. Otherwise unremarkable KUB.   Electronically Signed   By: Elige Ko   On: 04/01/2013 10:18  Scheduled Meds: . arformoterol  15 mcg Nebulization BID  . budesonide  0.25 mg Nebulization BID  . citalopram  20 mg Oral Daily  . diltiazem  30 mg Oral Q8H  . enoxaparin (LOVENOX) injection  40 mg Subcutaneous Q24H  . famotidine  20 mg Oral BID  . fluticasone  2 spray Each Nare Daily  . guaiFENesin  1,200 mg Oral BID  . hydroxychloroquine  200 mg Oral BID  . insulin aspart  0-9 Units Subcutaneous TID WC  . meropenem (MERREM) IV  1 g Intravenous Q8H  . nystatin   Topical BID  . sodium chloride  10-40 mL Intracatheter Q12H  . sodium chloride  3 mL Intravenous Q12H  . tiotropium  18 mcg Inhalation Daily   Continuous Infusions: . sodium chloride 50 mL/hr at 04/02/13 0643     Time spent: 25 minutes    Eliya Geiman  [facility] Pager [phone] If 7PM-7AM, please contact night-coverage at [email or web address], password [facility] 04/02/2013, 3:39 PM  LOS: 6 days

## 2013-04-02 NOTE — Progress Notes (Signed)
Subjective: Feels better today. Tolerating diet  Objective: Vital signs in last 24 hours: Temp:  [98.1 F (36.7 C)-99.4 F (37.4 C)] 98.1 F (36.7 C) (11/08 0621) Pulse Rate:  [88-100] 88 (11/08 0621) Resp:  [18-20] 20 (11/08 0621) BP: (115-140)/(50-78) 140/78 mmHg (11/08 0621) SpO2:  [100 %] 100 % (11/08 0839) Last BM Date: 04/01/13  Intake/Output from previous day: 11/07 0701 - 11/08 0700 In: 1978.5 [P.O.:1170; I.V.:803.5] Out: 560 [Urine:300; Drains:10; Stool:250] Intake/Output this shift:    General appearance: alert, cooperative and no distress Resp: nonlabored Cardio: normal rate, regular GI: soft, tender diffusely but greatest under midline incision, ND, and wound looks okay. she does have palpable mass or fluid collection under incision which is likely known fluid collection seen on CT.  JP with mostly ss output with some purulence but not much in bulb now. Ostomy functioning, and pink Skin: groin and labia area again examined with the nurse present, no evidence of infection seen  Lab Results:   Recent Labs  04/01/13 1300 04/02/13 0500  WBC 11.3* 11.6*  HGB 8.6* 8.4*  HCT 26.2* 25.6*  PLT 392 406*   BMET  Recent Labs  04/01/13 0527 04/02/13 0500  NA 141 142  K 3.7 3.3*  CL 111 111  CO2 21 23  GLUCOSE 76 82  BUN 3* 3*  CREATININE 0.81 0.80  CALCIUM 8.5 8.4   PT/INR No results found for this basename: LABPROT, INR,  in the last 72 hours ABG No results found for this basename: PHART, PCO2, PO2, HCO3,  in the last 72 hours  Studies/Results: Dg Abd Portable 1v  04/01/2013   CLINICAL DATA:  Abdominal pain.  EXAM: PORTABLE ABDOMEN - 1 VIEW  COMPARISON:  None.  FINDINGS: There is a pelvic percutaneous drainage catheter present. There are embolization coils noted in the mid abdomen. There is no bowel dilatation to suggest obstruction. There is no evidence of pneumoperitoneum, portal venous gas or pneumatosis. There are no pathologic calcifications along  the expected course of the ureters.The osseous structures are unremarkable.  IMPRESSION: Percutaneous pelvic drainage catheter in place. Otherwise unremarkable KUB.   Electronically Signed   By: Elige Ko   On: 04/01/2013 10:18    Anti-infectives: Anti-infectives   Start     Dose/Rate Route Frequency Ordered Stop   03/31/13 1400  meropenem (MERREM) 1 g in sodium chloride 0.9 % 100 mL IVPB     1 g 200 mL/hr over 30 Minutes Intravenous 3 times per day 03/31/13 1231     03/31/13 0700  vancomycin (VANCOCIN) 500 mg in sodium chloride 0.9 % 100 mL IVPB     500 mg 100 mL/hr over 60 Minutes Intravenous Every 12 hours 03/31/13 0628 04/01/13 2104   03/27/13 2200  piperacillin-tazobactam (ZOSYN) IVPB 3.375 g  Status:  Discontinued     3.375 g 12.5 mL/hr over 240 Minutes Intravenous 3 times per day 03/27/13 1530 03/31/13 1204   03/27/13 2200  hydroxychloroquine (PLAQUENIL) tablet 200 mg     200 mg Oral 2 times daily 03/27/13 1915     03/27/13 1430  piperacillin-tazobactam (ZOSYN) IVPB 3.375 g     3.375 g 100 mL/hr over 30 Minutes Intravenous  Once 03/27/13 1415 03/27/13 1514   03/27/13 0300  vancomycin (VANCOCIN) 500 mg in sodium chloride 0.9 % 100 mL IVPB  Status:  Discontinued     500 mg 100 mL/hr over 60 Minutes Intravenous Every 12 hours 03/27/13 2351 03/31/13 0628   03/27/13 0100  vancomycin (  VANCOCIN) 500 mg in sodium chloride 0.9 % 100 mL IVPB  Status:  Discontinued     500 mg 100 mL/hr over 60 Minutes Intravenous Every 12 hours 03/27/13 2227 03/27/13 2340      Assessment/Plan: s/p * No surgery found * continue drainage and abx.  repeat Ct soon  LOS: 6 days    Lodema Pilot DAVID 04/02/2013

## 2013-04-02 NOTE — Progress Notes (Signed)
Subjective: Pelvic abscess drain placed 11/3 Better  Up in bed eating full breakfast No complaints  Objective: Vital signs in last 24 hours: Temp:  [98.1 F (36.7 C)-99.4 F (37.4 C)] 98.1 F (36.7 C) (11/08 0621) Pulse Rate:  [88-100] 88 (11/08 0621) Resp:  [18-20] 20 (11/08 0621) BP: (115-140)/(50-78) 140/78 mmHg (11/08 0621) SpO2:  [100 %] 100 % (11/08 0621) Last BM Date: 04/01/13  Intake/Output from previous day: 11/07 0701 - 11/08 0700 In: 1978.5 [P.O.:1170; I.V.:803.5] Out: 560 [Urine:300; Drains:10; Stool:250] Intake/Output this shift:    PE:  afeb VSS TG drain intact Output 10 cc x 2 days now Serous fluid: cx:Gr- rods Site clean and dry; sl tender Wbc 11.6  Lab Results:   Recent Labs  04/01/13 1300 04/02/13 0500  WBC 11.3* 11.6*  HGB 8.6* 8.4*  HCT 26.2* 25.6*  PLT 392 406*   BMET  Recent Labs  04/01/13 0527 04/02/13 0500  NA 141 142  K 3.7 3.3*  CL 111 111  CO2 21 23  GLUCOSE 76 82  BUN 3* 3*  CREATININE 0.81 0.80  CALCIUM 8.5 8.4   PT/INR No results found for this basename: LABPROT, INR,  in the last 72 hours ABG No results found for this basename: PHART, PCO2, PO2, HCO3,  in the last 72 hours  Studies/Results: Dg Abd Portable 1v  04/01/2013   CLINICAL DATA:  Abdominal pain.  EXAM: PORTABLE ABDOMEN - 1 VIEW  COMPARISON:  None.  FINDINGS: There is a pelvic percutaneous drainage catheter present. There are embolization coils noted in the mid abdomen. There is no bowel dilatation to suggest obstruction. There is no evidence of pneumoperitoneum, portal venous gas or pneumatosis. There are no pathologic calcifications along the expected course of the ureters.The osseous structures are unremarkable.  IMPRESSION: Percutaneous pelvic drainage catheter in place. Otherwise unremarkable KUB.   Electronically Signed   By: Elige Ko   On: 04/01/2013 10:18    Anti-infectives: Anti-infectives   Start     Dose/Rate Route Frequency Ordered Stop    03/31/13 1400  meropenem (MERREM) 1 g in sodium chloride 0.9 % 100 mL IVPB     1 g 200 mL/hr over 30 Minutes Intravenous 3 times per day 03/31/13 1231     03/31/13 0700  vancomycin (VANCOCIN) 500 mg in sodium chloride 0.9 % 100 mL IVPB     500 mg 100 mL/hr over 60 Minutes Intravenous Every 12 hours 03/31/13 0628 04/01/13 2104   03/27/13 2200  piperacillin-tazobactam (ZOSYN) IVPB 3.375 g  Status:  Discontinued     3.375 g 12.5 mL/hr over 240 Minutes Intravenous 3 times per day 03/27/13 1530 03/31/13 1204   03/27/13 2200  hydroxychloroquine (PLAQUENIL) tablet 200 mg     200 mg Oral 2 times daily 03/27/13 1915     03/27/13 1430  piperacillin-tazobactam (ZOSYN) IVPB 3.375 g     3.375 g 100 mL/hr over 30 Minutes Intravenous  Once 03/27/13 1415 03/27/13 1514   03/27/13 0300  vancomycin (VANCOCIN) 500 mg in sodium chloride 0.9 % 100 mL IVPB  Status:  Discontinued     500 mg 100 mL/hr over 60 Minutes Intravenous Every 12 hours 03/27/13 2351 03/31/13 0628   03/27/13 0100  vancomycin (VANCOCIN) 500 mg in sodium chloride 0.9 % 100 mL IVPB  Status:  Discontinued     500 mg 100 mL/hr over 60 Minutes Intravenous Every 12 hours 03/27/13 2227 03/27/13 2340      Assessment/Plan: s/p * No surgery found *  TG pelvic abscess drain intact Output slowing- only 10 cc x 2 days For CT tomorrow per pt Will check CT Plan per [facility]   LOS: 6 days    Rainah Kirshner A 04/02/2013

## 2013-04-03 ENCOUNTER — Encounter (HOSPITAL_COMMUNITY): Payer: Self-pay | Admitting: *Deleted

## 2013-04-03 ENCOUNTER — Inpatient Hospital Stay (HOSPITAL_COMMUNITY): Payer: Medicare Other

## 2013-04-03 LAB — GLUCOSE, CAPILLARY
Glucose-Capillary: 92 mg/dL (ref 70–99)
Glucose-Capillary: 82 mg/dL (ref 70–99)

## 2013-04-03 LAB — CREATININE, SERUM: GFR calc Af Amer: >90 mL/min (ref >90)

## 2013-04-03 MED ORDER — IOHEXOL 300 MG/ML  SOLN
25.0000 mL | INTRAMUSCULAR | Status: AC
  Administered 2013-04-03 (×2): 25 mL via ORAL

## 2013-04-03 MED ORDER — IOHEXOL 300 MG/ML  SOLN
100.0000 mL | Freq: Once | INTRAMUSCULAR | Status: AC | PRN
  Administered 2013-04-03: 100 mL via INTRAVENOUS

## 2013-04-03 NOTE — Progress Notes (Signed)
[facility] PROGRESS NOTE  [name] AVW:[id number] DOB: May 10, 1951 DOA: 03/27/2013 PCP: [name], MD  Brief narrative:  62 y.o. female with hx of COPD on home o2 ( 2.5 L), DM, HL, osteoporosis, RA, asthma, who was recently discharged from the hospital after undergoing laparotomy for adhesions on 10/22. Post op she was found to have MRSA Bacteremia in one set of blood cultures. TTE and TEE did not show any vegetations. She was discharged on IV vancomycin to complete a 14 day course. She got a PICC line before discharge,. She returned to the ED on 11/2 with fever chills, mild abdominal discomfort and nausea, without vomiting. On arrival to ED, she was found to be febrile , hypotensive and tachycardic. Labs revealed leukocytosis. A CXR was negative for pneumonia . CT abdomen and pelvis showed pelvic abscess.  [facility] consulting on medical issues.   Assessment/Plan:  Sepsis  -due to pelvic abscesses . Drain placed by IR on 11/3  -cx growing GNR. ( moderate bacteroides and beta lactamase pos). sensitivity pending  - on empiric vancomycin and meropenem. ID following. D/c vancomycin on 11/7 . Meropenem for total 2 weeks course ( until 11/16)   S/p recent laparotomy with LOA and ventral hernia repair  primary team following  - repeat abd CT done today showing persistent right upper abdomen fluid collection suggestive of abscess. Patient had persistent abdominal tenderness without fever or leucocytosis. Primary team to evaluate.   Anemia  -transfused 2 units PRBCs due to soft BP readings 11/3  -hb current in 8s  -baseline Hgb ~ 10-11  likely in the setting of recent sx and underlying sepsis. No active blood loss noted.   Hypotension  -secondary to volume depletion and pain meds  -resolved. Cont low dose cardizem  DIABETES, TYPE 2  -CBGs stable   MULTIFOCAL ATRIAL TACHYCARDIA  -recent admission for this  -HR currently controlled  -on low dose cardizem    Chronic hypoxic respiratory failure/Gold stage D. COPD  -stable - followed by [facility]  -On chronic o2 via [place]. continue inhalers and prn nebs    Hypokalemia  relpenished   Rheumatoid arthritis  -on Plaquenil   DVT prophylaxis: sq Lovenox  Code Status: Full  Procedures:  Insertion of percutaneous drain to pelvic abscess 11/3  Disposition : per primary team  Antibiotics:  Zosyn 11/2 >>> 11/5  Meropenem (11/6>>until 11/16)  Vancomycin 11/2 >>> 11/7  HPI/Subjective:  Still has abdominal pain. No N/V       Objective: Filed Vitals:   04/03/13 1429  BP: 145/56  Pulse: 89  Temp: 99.1 F (37.3 C)  Resp: 20    Intake/Output Summary (Last 24 hours) at 04/03/13 1746 Last data filed at 04/03/13 1300  Gross per 24 hour  Intake    480 ml  Output   2805 ml  Net  -2325 ml   Filed Weights   03/27/13 1300 03/27/13 1919 03/30/13 0442  Weight: 65.6 kg (144 lb 10 oz) 58.2 kg (128 lb 4.9 oz) 57.5 kg (126 lb 12.2 oz)    Exam: General: Elderly female in NAD, appears fatigued  chest : clear b/l, no added sounds  CVS: NS1 and s2, No added sounds  Respiratory: clear b/l, no added sounds  abd: soft, laparotomy scar, ostomy in place . Pelvic drain with serosanguinous drainage, BS+,abdominal tenderness present.  Ext: warm, no edema  CNS: AAOX3  Data Reviewed: Basic Metabolic Panel:  Recent Labs Lab 03/29/13 0445 03/30/13 0530 03/31/13 0509 04/01/13  0454 04/02/13 0500 04/03/13 0500  NA 142 142 143 141 142  --   K 3.9 3.3* 4.6 3.7 3.3*  --   CL 109 112 115* 111 111  --   CO2 23 23 17* 21 23  --   GLUCOSE 80 91 76 76 82  --   BUN 7 5* 4* 3* 3*  --   CREATININE 1.03 0.93 0.91 0.81 0.80 0.75  CALCIUM 7.4* 7.5* 8.0* 8.5 8.4  --    Liver Function Tests: No results found for this basename: AST, ALT, ALKPHOS, BILITOT, PROT, ALBUMIN,  in the last 168 hours No results found for this basename: LIPASE, AMYLASE,  in the last 168 hours No results found for this  basename: AMMONIA,  in the last 168 hours CBC:  Recent Labs Lab 03/29/13 0445 03/30/13 0530 03/31/13 0509 04/01/13 1300 04/02/13 0500  WBC 14.7* 11.2* 9.8 11.3* 11.6*  HGB 9.2* 9.0* 9.1* 8.6* 8.4*  HCT 26.6* 26.7* 27.5* 26.2* 25.6*  MCV 82.1 82.9 84.4 84.5 83.9  PLT 324 363 390 392 406*   Cardiac Enzymes: No results found for this basename: CKTOTAL, CKMB, CKMBINDEX, TROPONINI,  in the last 168 hours BNP (last 3 results) No results found for this basename: PROBNP,  in the last 8760 hours CBG:  Recent Labs Lab 04/02/13 1201 04/02/13 1720 04/03/13 0748 04/03/13 1142 04/03/13 1720  GLUCAP 102* 109* 76 92 82    Recent Results (from the past 240 hour(s))  URINE CULTURE     Status: None   Collection Time    03/27/13  1:53 PM      Result Value Range Status   Specimen Description URINE, CLEAN CATCH   Final   Special Requests NONE   Final   Culture  Setup Time     Final   Value: 03/27/2013 21:50     Performed at [facility] Count     Final   Value: 15,000 COLONIES/ML     Performed at [facility]   Culture     Final   Value: YEAST     Performed at [facility]   Report Status 03/28/2013 FINAL   Final  CULTURE, BLOOD (ROUTINE X 2)     Status: None   Collection Time    03/27/13  1:55 PM      Result Value Range Status   Specimen Description BLOOD PICC LINE   Final   Special Requests BOTTLES DRAWN AEROBIC AND ANAEROBIC 10CC   Final   Culture  Setup Time     Final   Value: 03/27/2013 21:48     Performed at [facility]   Culture     Final   Value: NO GROWTH 5 DAYS     Performed at [facility]   Report Status 04/02/2013 FINAL   Final  CULTURE, BLOOD (ROUTINE X 2)     Status: None   Collection Time    03/27/13  3:25 PM      Result Value Range Status   Specimen Description BLOOD RIGHT HAND   Final   Special Requests     Final   Value: BOTTLES DRAWN AEROBIC AND ANAEROBIC 10CC BLUE,5CC RED   Culture  Setup Time      Final   Value: 03/27/2013 21:48     Performed at [facility]   Culture     Final   Value: NO GROWTH 5 DAYS     Performed at  [facility]   Report Status 04/02/2013 FINAL   Final  MRSA PCR SCREENING     Status: None   Collection Time    03/27/13  7:17 PM      Result Value Range Status   MRSA by PCR NEGATIVE  NEGATIVE Final   Comment:            The GeneXpert MRSA Assay (FDA     approved for NASAL specimens     only), is one component of a     comprehensive MRSA colonization     surveillance program. It is not     intended to diagnose MRSA     infection nor to guide or     monitor treatment for     MRSA infections.  CULTURE, ROUTINE-ABSCESS     Status: None   Collection Time    03/28/13  3:27 PM      Result Value Range Status   Specimen Description ABSCESS BUTTOCKS RIGHT   Final   Special Requests NONE   Final   Gram Stain     Final   Value: MODERATE WBC PRESENT,BOTH PMN AND MONONUCLEAR     NO SQUAMOUS EPITHELIAL CELLS SEEN     ABUNDANT GRAM NEGATIVE RODS     Performed at [facility]   Culture     Final   Value: NO GROWTH 3 DAYS     Performed at [facility]   Report Status 04/01/2013 FINAL   Final  ANAEROBIC CULTURE     Status: None   Collection Time    03/28/13  3:27 PM      Result Value Range Status   Specimen Description ABSCESS BUTTOCKS RIGHT   Final   Special Requests NONE   Final   Gram Stain     Final   Value: MODERATE WBC PRESENT,BOTH PMN AND MONONUCLEAR     NO SQUAMOUS EPITHELIAL CELLS SEEN     ABUNDANT GRAM NEGATIVE RODS     Performed at [facility]   Culture     Final   Value: MODERATE BACTEROIDES SPECIES     Note: BETA LACTAMASE POSITIVE     Performed at [facility]   Report Status 04/02/2013 FINAL   Final     Studies: Ct Abdomen Pelvis W Contrast  04/03/2013   CLINICAL DATA:  FOLLOW UP pelvic drain for clearing of abscess  EXAM: CT ABDOMEN AND PELVIS WITH CONTRAST  TECHNIQUE: Multidetector CT  imaging of the abdomen and pelvis was performed using the standard protocol following bolus administration of intravenous contrast.  CONTRAST:  OMNIPAQUE IOHEXOL 300 MG/ML  SOLN  COMPARISON:  03/27/2013.  FINDINGS: There is a trace right pleural effusion with basilar atelectasis. There is a small left pleural effusion.  The liver demonstrates no focal abnormality. There is no intrahepatic or extrahepatic biliary ductal dilatation. The gallbladder is normal. The spleen demonstrates no focal abnormality. The kidneys, adrenal glands and pancreas are normal. The bladder is unremarkable.  There are surgical clips in the epigastric region. The stomach, duodenum, small intestine, and large intestine demonstrate no contrast extravasation or dilatation. There is a right lower quadrant enterostomy. There is a pigtail catheter present within the pelvis with and near complete resolution of the complex pelvic fluid collection with only a tiny focus remaining measuring approximately 8.6 mm. There is a persistent small fluid collection with rim enhancement in the right upper anterior abdomen measuring 5 x 1 cm on image 38  series 2. This is essentially unchanged from the prior exam. There is a 3.9 x 3.5 cm left periumbilical fluid collection within the subcutaneous fat likely representing a postoperative seroma. There is no pneumoperitoneum, pneumatosis, or portal venous gas. There is no abdominal or pelvic free fluid. There is no lymphadenopathy.  The abdominal aorta is normal in caliber with atherosclerosis.  There are no lytic or sclerotic osseous lesions.  IMPRESSION: 1. There is a pelvic pigtail drainage catheter present with near complete interval resolution of the complex pelvic fluid collection with only a small focus remaining measuring 8.6 mm.  2. There is a persistent small fluid collection with rim enhancement in the right upper anterior abdomen measuring 5 x 1 cm most concerning for an abscess.  3. Left  periumbilical fluid collection within the subcutaneous fat most consistent with a postoperative seroma.   Electronically Signed   By: Elige Ko   On: 04/03/2013 16:50    Scheduled Meds: . arformoterol  15 mcg Nebulization BID  . budesonide  0.25 mg Nebulization BID  . citalopram  20 mg Oral Daily  . diltiazem  30 mg Oral Q8H  . enoxaparin (LOVENOX) injection  40 mg Subcutaneous Q24H  . famotidine  20 mg Oral BID  . fluticasone  2 spray Each Nare Daily  . guaiFENesin  1,200 mg Oral BID  . hydroxychloroquine  200 mg Oral BID  . insulin aspart  0-9 Units Subcutaneous TID WC  . meropenem (MERREM) IV  1 g Intravenous Q8H  . nystatin   Topical BID  . sodium chloride  10-40 mL Intracatheter Q12H  . sodium chloride  3 mL Intravenous Q12H  . tiotropium  18 mcg Inhalation Daily   Continuous Infusions: . sodium chloride 50 mL/hr at 04/03/13 0448      Time spent: 25 minutes    Brighton Delio  [facility] Pager [phone] 7PM-7AM, please contact night-coverage at [email or web address], password [facility] 04/03/2013, 5:46 PM  LOS: 7 days

## 2013-04-03 NOTE — Progress Notes (Signed)
Subjective: Feeling better.  Tolerating diet and bowels okay.    Objective: Vital signs in last 24 hours: Temp:  [97.9 F (36.6 C)-99.1 F (37.3 C)] 98 F (36.7 C) (11/09 0452) Pulse Rate:  [85-96] 85 (11/09 0452) Resp:  [16-17] 17 (11/09 0452) BP: (129-141)/(59-77) 129/59 mmHg (11/09 0452) SpO2:  [100 %] 100 % (11/09 0452) Last BM Date: 04/02/13  Intake/Output from previous day: 11/08 0701 - 11/09 0700 In: 5  Out: 2905 [Urine:2900; Drains:5] Intake/Output this shift:    General appearance: alert, cooperative and no distress GI: soft, ND, tenderness unchanged.  drain clearing and looks more ss  Lab Results:   Recent Labs  04/01/13 1300 04/02/13 0500  WBC 11.3* 11.6*  HGB 8.6* 8.4*  HCT 26.2* 25.6*  PLT 392 406*   BMET  Recent Labs  04/01/13 0527 04/02/13 0500 04/03/13 0500  NA 141 142  --   K 3.7 3.3*  --   CL 111 111  --   CO2 21 23  --   GLUCOSE 76 82  --   BUN 3* 3*  --   CREATININE 0.81 0.80 0.75  CALCIUM 8.5 8.4  --    PT/INR No results found for this basename: LABPROT, INR,  in the last 72 hours ABG No results found for this basename: PHART, PCO2, PO2, HCO3,  in the last 72 hours  Studies/Results: Dg Abd Portable 1v  04/01/2013   CLINICAL DATA:  Abdominal pain.  EXAM: PORTABLE ABDOMEN - 1 VIEW  COMPARISON:  None.  FINDINGS: There is a pelvic percutaneous drainage catheter present. There are embolization coils noted in the mid abdomen. There is no bowel dilatation to suggest obstruction. There is no evidence of pneumoperitoneum, portal venous gas or pneumatosis. There are no pathologic calcifications along the expected course of the ureters.The osseous structures are unremarkable.  IMPRESSION: Percutaneous pelvic drainage catheter in place. Otherwise unremarkable KUB.   Electronically Signed   By: Elige Ko   On: 04/01/2013 10:18    Anti-infectives: Anti-infectives   Start     Dose/Rate Route Frequency Ordered Stop   03/31/13 1400   meropenem (MERREM) 1 g in sodium chloride 0.9 % 100 mL IVPB     1 g 200 mL/hr over 30 Minutes Intravenous 3 times per day 03/31/13 1231     03/31/13 0700  vancomycin (VANCOCIN) 500 mg in sodium chloride 0.9 % 100 mL IVPB     500 mg 100 mL/hr over 60 Minutes Intravenous Every 12 hours 03/31/13 0628 04/01/13 2104   03/27/13 2200  piperacillin-tazobactam (ZOSYN) IVPB 3.375 g  Status:  Discontinued     3.375 g 12.5 mL/hr over 240 Minutes Intravenous 3 times per day 03/27/13 1530 03/31/13 1204   03/27/13 2200  hydroxychloroquine (PLAQUENIL) tablet 200 mg     200 mg Oral 2 times daily 03/27/13 1915     03/27/13 1430  piperacillin-tazobactam (ZOSYN) IVPB 3.375 g     3.375 g 100 mL/hr over 30 Minutes Intravenous  Once 03/27/13 1415 03/27/13 1514   03/27/13 0300  vancomycin (VANCOCIN) 500 mg in sodium chloride 0.9 % 100 mL IVPB  Status:  Discontinued     500 mg 100 mL/hr over 60 Minutes Intravenous Every 12 hours 03/27/13 2351 03/31/13 0628   03/27/13 0100  vancomycin (VANCOCIN) 500 mg in sodium chloride 0.9 % 100 mL IVPB  Status:  Discontinued     500 mg 100 mL/hr over 60 Minutes Intravenous Every 12 hours 03/27/13 2227 03/27/13 2340  Assessment/Plan: s/p * No surgery found * plan for CT abdomen today to evaluate fluid collections  LOS: 7 days    Lodema Pilot DAVID 04/03/2013

## 2013-04-04 DIAGNOSIS — B9689 Other specified bacterial agents as the cause of diseases classified elsewhere: Secondary | ICD-10-CM

## 2013-04-04 DIAGNOSIS — K5090 Crohn's disease, unspecified, without complications: Secondary | ICD-10-CM

## 2013-04-04 LAB — GLUCOSE, CAPILLARY
Glucose-Capillary: 68 mg/dL — ABNORMAL LOW (ref 70–99)
Glucose-Capillary: 75 mg/dL (ref 70–99)
Glucose-Capillary: 118 mg/dL — ABNORMAL HIGH (ref 70–99)
Glucose-Capillary: 128 mg/dL — ABNORMAL HIGH (ref 70–99)

## 2013-04-04 LAB — CBC
HCT: 27.5 % — ABNORMAL LOW (ref 36.0–46.0)
MCHC: 32.4 g/dL (ref 30.0–36.0)
MCV: 84.1 fL (ref 78.0–100.0)
Platelets: 423 10*3/uL — ABNORMAL HIGH (ref 150–400)
RBC: 3.27 MIL/uL — ABNORMAL LOW (ref 3.87–5.11)
WBC: 10.3 10*3/uL (ref 4.0–10.5)

## 2013-04-04 MED ORDER — DEXTROSE-NACL 5-0.9 % IV SOLN
INTRAVENOUS | Status: DC
  Administered 2013-04-04 – 2013-04-05 (×2): via INTRAVENOUS

## 2013-04-04 MED ORDER — BOOST / RESOURCE BREEZE PO LIQD
1.0000 | Freq: Three times a day (TID) | ORAL | Status: DC
  Administered 2013-04-04 – 2013-04-06 (×5): 1 via ORAL

## 2013-04-04 NOTE — Progress Notes (Signed)
Patient blood sugar 68, alert and oriented, OJ given. MD made aware. Will monitor.

## 2013-04-04 NOTE — Progress Notes (Signed)
NUTRITION FOLLOW UP  Intervention:   1. Resource Breeze TID, 8 oz provides 250 kcal, 9 g protein 2. Encourage adequate intake of meals and supplements.   Nutrition Dx:   Inadequate oral intake now related to decreased appetite as evidenced by 50% meal intake, improved  Goal:   Patient will meet >/=90% of estimated nutrition needs, improved  Monitor:   PO intake, weight, labs, I/O's   Assessment:   Patient with PMH DM, HTN, anxiety & depression and PNA recently discharged from the hospital after undergoing laparotomy for adhesions on 10/22; presented back to [facility] with fever, chills, mild abdominal discomfort and nausea.   Diet advanced to carbohydrate modified. She reports that her appetite is improved, but not yet at baseline. She is agreeable to trying nutrition supplements to improve nutrition.    Height: Ht Readings from Last 1 Encounters:  03/27/13 5\' 1"  (1.549 m)    Weight Status:   Wt Readings from Last 1 Encounters:  03/30/13 126 lb 12.2 oz (57.5 kg)    Re-estimated needs:  Kcal: 1500-1700  Protein: 70-80 gm  Fluid: 1.5-1.7 L  Skin: Incision, abdomen  Diet Order: Carb Control, 50% intake   Intake/Output Summary (Last 24 hours) at 04/04/13 1341 Last data filed at 04/04/13 0932  Gross per 24 hour  Intake    300 ml  Output    800 ml  Net   -500 ml    Last BM: 11/9, x3   Labs:   Recent Labs Lab 03/31/13 0509 04/01/13 0527 04/02/13 0500 04/03/13 0500  NA 143 141 142  --   K 4.6 3.7 3.3*  --   CL 115* 111 111  --   CO2 17* 21 23  --   BUN 4* 3* 3*  --   CREATININE 0.91 0.81 0.80 0.75  CALCIUM 8.0* 8.5 8.4  --   GLUCOSE 76 76 82  --     CBG (last 3)   Recent Labs  04/04/13 0758 04/04/13 0954 04/04/13 1153  GLUCAP 68* 75 118*    Scheduled Meds: . arformoterol  15 mcg Nebulization BID  . budesonide  0.25 mg Nebulization BID  . citalopram  20 mg Oral Daily  . diltiazem  30 mg Oral Q8H  . enoxaparin (LOVENOX) injection  40 mg  Subcutaneous Q24H  . famotidine  20 mg Oral BID  . fluticasone  2 spray Each Nare Daily  . guaiFENesin  1,200 mg Oral BID  . hydroxychloroquine  200 mg Oral BID  . insulin aspart  0-9 Units Subcutaneous TID WC  . meropenem (MERREM) IV  1 g Intravenous Q8H  . nystatin   Topical BID  . sodium chloride  10-40 mL Intracatheter Q12H  . sodium chloride  3 mL Intravenous Q12H  . tiotropium  18 mcg Inhalation Daily    Continuous Infusions: . sodium chloride 50 mL/hr at 04/03/13 0448  . dextrose 5 % and 0.9% NaCl 50 mL/hr at 04/04/13 0823    Linnell Fulling, RD, LDN Pager #: [phone] After-Hours Pager #: [phone]

## 2013-04-04 NOTE — Progress Notes (Signed)
[facility] PROGRESS NOTE  [name] ZOX:[id number] DOB: 1950/09/29 DOA: 03/27/2013 PCP: [name], MD  Brief narrative:  62 y.o. female with hx of COPD on home o2 ( 2.5 L), DM, HL, osteoporosis, RA, asthma, who was recently discharged from the hospital after undergoing laparotomy for adhesions on 10/22. Post op she was found to have MRSA Bacteremia in one set of blood cultures. TTE and TEE did not show any vegetations. She was discharged on IV vancomycin to complete a 14 day course. She got a PICC line before discharge,. She returned to the ED on 11/2 with fever chills, mild abdominal discomfort and nausea, without vomiting. On arrival to ED, she was found to be febrile , hypotensive and tachycardic. Labs revealed leukocytosis. A CXR was negative for pneumonia . CT abdomen and pelvis showed pelvic abscess.  [facility] consulting on medical issues.   Assessment/Plan:  Pelvic abscess  . Drain placed by IR on 11/3  -cx growing GNR. ( moderate bacteroides and beta lactamase pos).  -  ID following. D/ced vancomycin on 11/7. Meropenem for total 2 weeks course ( until 11/16)  Afebrile  S/p recent laparotomy with LOA and ventral hernia repair  primary team following  - repeat abd CT done on 11/9 showing persistent right upper abdomen fluid collection suggestive of abscess. Patient had persistent abdominal tenderness without fever or leucocytosis. Aspirated 6 cc of fluid from mid abdomen seroma. Feels better.   Anemia  -transfused 2 units PRBCs  -hb current in 8s  -baseline Hgb ~ 10-11  likely in the setting of recent sx and underlying sepsis. No active blood loss noted.   HTN  on low dose cardizem  DIABETES, TYPE 2  -stable . Continue SSI  MULTIFOCAL ATRIAL TACHYCARDIA  -recent admission for this  -HR currently controlled  -on low dose cardizem   Chronic hypoxic respiratory failure/Gold stage D. COPD  -stable - followed by [facility]  -On chronic o2  via [place]. continue inhalers and prn nebs   Hypokalemia  relpenished   Rheumatoid arthritis  -on Plaquenil   DVT prophylaxis: sq Lovenox  Code Status: Full  Procedures:  Insertion of percutaneous drain to pelvic abscess 11/3   Disposition : per primary team . SNF vs HHPT Antibiotics:  Zosyn 11/2 >>> 11/5  Meropenem (11/6>>until 11/16)  Vancomycin 11/2 >>> 11/7   HPI/Subjective:  No N/V. abd pain better. Ambulated in the hallway today     Objective: Filed Vitals:   04/04/13 1322  BP: 132/68  Pulse: 90  Temp: 98 F (36.7 C)  Resp: 16    Intake/Output Summary (Last 24 hours) at 04/04/13 1558 Last data filed at 04/04/13 0932  Gross per 24 hour  Intake    300 ml  Output    800 ml  Net   -500 ml   Filed Weights   03/27/13 1300 03/27/13 1919 03/30/13 0442  Weight: 65.6 kg (144 lb 10 oz) 58.2 kg (128 lb 4.9 oz) 57.5 kg (126 lb 12.2 oz)    Exam: General: Elderly female in NAD, appears fatigued  chest : clear b/l, no added sounds  CVS: NS1 and s2, No added sounds  Respiratory: clear b/l, no added sounds  abd: soft, laparotomy scar, ostomy in place . Pelvic drain with serosanguinous drainage, BS+,abdominal tenderness present. ( less than previous days) Ext: warm, no edema  CNS: AAOX3   Data Reviewed: Basic Metabolic Panel:  Recent Labs Lab 03/29/13 0445 03/30/13 0530 03/31/13 0509 04/01/13 0527 04/02/13  0500 04/03/13 0500  NA 142 142 143 141 142  --   K 3.9 3.3* 4.6 3.7 3.3*  --   CL 109 112 115* 111 111  --   CO2 23 23 17* 21 23  --   GLUCOSE 80 91 76 76 82  --   BUN 7 5* 4* 3* 3*  --   CREATININE 1.03 0.93 0.91 0.81 0.80 0.75  CALCIUM 7.4* 7.5* 8.0* 8.5 8.4  --    Liver Function Tests: No results found for this basename: AST, ALT, ALKPHOS, BILITOT, PROT, ALBUMIN,  in the last 168 hours No results found for this basename: LIPASE, AMYLASE,  in the last 168 hours No results found for this basename: AMMONIA,  in the last 168 hours CBC:  Recent  Labs Lab 03/30/13 0530 03/31/13 0509 04/01/13 1300 04/02/13 0500 04/04/13 0925  WBC 11.2* 9.8 11.3* 11.6* 10.3  HGB 9.0* 9.1* 8.6* 8.4* 8.9*  HCT 26.7* 27.5* 26.2* 25.6* 27.5*  MCV 82.9 84.4 84.5 83.9 84.1  PLT 363 390 392 406* 423*   Cardiac Enzymes: No results found for this basename: CKTOTAL, CKMB, CKMBINDEX, TROPONINI,  in the last 168 hours BNP (last 3 results) No results found for this basename: PROBNP,  in the last 8760 hours CBG:  Recent Labs Lab 04/03/13 1142 04/03/13 1720 04/04/13 0758 04/04/13 0954 04/04/13 1153  GLUCAP 92 82 68* 75 118*    Recent Results (from the past 240 hour(s))  URINE CULTURE     Status: None   Collection Time    03/27/13  1:53 PM      Result Value Range Status   Specimen Description URINE, CLEAN CATCH   Final   Special Requests NONE   Final   Culture  Setup Time     Final   Value: 03/27/2013 21:50     Performed at [facility] Count     Final   Value: 15,000 COLONIES/ML     Performed at [facility]   Culture     Final   Value: YEAST     Performed at [facility]   Report Status 03/28/2013 FINAL   Final  CULTURE, BLOOD (ROUTINE X 2)     Status: None   Collection Time    03/27/13  1:55 PM      Result Value Range Status   Specimen Description BLOOD PICC LINE   Final   Special Requests BOTTLES DRAWN AEROBIC AND ANAEROBIC 10CC   Final   Culture  Setup Time     Final   Value: 03/27/2013 21:48     Performed at [facility]   Culture     Final   Value: NO GROWTH 5 DAYS     Performed at [facility]   Report Status 04/02/2013 FINAL   Final  CULTURE, BLOOD (ROUTINE X 2)     Status: None   Collection Time    03/27/13  3:25 PM      Result Value Range Status   Specimen Description BLOOD RIGHT HAND   Final   Special Requests     Final   Value: BOTTLES DRAWN AEROBIC AND ANAEROBIC 10CC BLUE,5CC RED   Culture  Setup Time     Final   Value: 03/27/2013 21:48     Performed at [facility]   Culture     Final   Value: NO GROWTH 5 DAYS     Performed at [facility]   Report Status 04/02/2013 FINAL   Final  MRSA PCR SCREENING     Status: None   Collection Time    03/27/13  7:17 PM      Result Value Range Status   MRSA by PCR NEGATIVE  NEGATIVE Final   Comment:            The GeneXpert MRSA Assay (FDA     approved for NASAL specimens     only), is one component of a     comprehensive MRSA colonization     surveillance program. It is not     intended to diagnose MRSA     infection nor to guide or     monitor treatment for     MRSA infections.  CULTURE, ROUTINE-ABSCESS     Status: None   Collection Time    03/28/13  3:27 PM      Result Value Range Status   Specimen Description ABSCESS BUTTOCKS RIGHT   Final   Special Requests NONE   Final   Gram Stain     Final   Value: MODERATE WBC PRESENT,BOTH PMN AND MONONUCLEAR     NO SQUAMOUS EPITHELIAL CELLS SEEN     ABUNDANT GRAM NEGATIVE RODS     Performed at [facility]   Culture     Final   Value: NO GROWTH 3 DAYS     Performed at [facility]   Report Status 04/01/2013 FINAL   Final  ANAEROBIC CULTURE     Status: None   Collection Time    03/28/13  3:27 PM      Result Value Range Status   Specimen Description ABSCESS BUTTOCKS RIGHT   Final   Special Requests NONE   Final   Gram Stain     Final   Value: MODERATE WBC PRESENT,BOTH PMN AND MONONUCLEAR     NO SQUAMOUS EPITHELIAL CELLS SEEN     ABUNDANT GRAM NEGATIVE RODS     Performed at [facility]   Culture     Final   Value: MODERATE BACTEROIDES SPECIES     Note: BETA LACTAMASE POSITIVE     Performed at [facility]   Report Status 04/02/2013 FINAL   Final     Studies: Ct Abdomen Pelvis W Contrast  04/03/2013   CLINICAL DATA:  FOLLOW UP pelvic drain for clearing of abscess  EXAM: CT ABDOMEN AND PELVIS WITH CONTRAST  TECHNIQUE: Multidetector CT imaging of the abdomen and pelvis was performed using the  standard protocol following bolus administration of intravenous contrast.  CONTRAST:  OMNIPAQUE IOHEXOL 300 MG/ML  SOLN  COMPARISON:  03/27/2013.  FINDINGS: There is a trace right pleural effusion with basilar atelectasis. There is a small left pleural effusion.  The liver demonstrates no focal abnormality. There is no intrahepatic or extrahepatic biliary ductal dilatation. The gallbladder is normal. The spleen demonstrates no focal abnormality. The kidneys, adrenal glands and pancreas are normal. The bladder is unremarkable.  There are surgical clips in the epigastric region. The stomach, duodenum, small intestine, and large intestine demonstrate no contrast extravasation or dilatation. There is a right lower quadrant enterostomy. There is a pigtail catheter present within the pelvis with and near complete resolution of the complex pelvic fluid collection with only a tiny focus remaining measuring approximately 8.6 mm. There is a persistent small fluid collection with rim enhancement in the right upper anterior abdomen measuring 5 x 1 cm on image 38 series 2.  This is essentially unchanged from the prior exam. There is a 3.9 x 3.5 cm left periumbilical fluid collection within the subcutaneous fat likely representing a postoperative seroma. There is no pneumoperitoneum, pneumatosis, or portal venous gas. There is no abdominal or pelvic free fluid. There is no lymphadenopathy.  The abdominal aorta is normal in caliber with atherosclerosis.  There are no lytic or sclerotic osseous lesions.  IMPRESSION: 1. There is a pelvic pigtail drainage catheter present with near complete interval resolution of the complex pelvic fluid collection with only a small focus remaining measuring 8.6 mm.  2. There is a persistent small fluid collection with rim enhancement in the right upper anterior abdomen measuring 5 x 1 cm most concerning for an abscess.  3. Left periumbilical fluid collection within the subcutaneous fat most  consistent with a postoperative seroma.   Electronically Signed   By: Elige Ko   On: 04/03/2013 16:50    Scheduled Meds: . arformoterol  15 mcg Nebulization BID  . budesonide  0.25 mg Nebulization BID  . citalopram  20 mg Oral Daily  . diltiazem  30 mg Oral Q8H  . enoxaparin (LOVENOX) injection  40 mg Subcutaneous Q24H  . famotidine  20 mg Oral BID  . feeding supplement (RESOURCE BREEZE)  1 Container Oral TID BM  . fluticasone  2 spray Each Nare Daily  . guaiFENesin  1,200 mg Oral BID  . hydroxychloroquine  200 mg Oral BID  . insulin aspart  0-9 Units Subcutaneous TID WC  . meropenem (MERREM) IV  1 g Intravenous Q8H  . nystatin   Topical BID  . sodium chloride  10-40 mL Intracatheter Q12H  . sodium chloride  3 mL Intravenous Q12H  . tiotropium  18 mcg Inhalation Daily   Continuous Infusions: . sodium chloride 50 mL/hr at 04/03/13 0448  . dextrose 5 % and 0.9% NaCl 50 mL/hr at 04/04/13 0823      Time spent: 25 minutes    [name]  [facility] Pager [phone]. If 7PM-7AM, please contact night-coverage at [email or web address], password [facility] 04/04/2013, 3:58 PM  LOS: 8 days

## 2013-04-04 NOTE — Progress Notes (Signed)
Physical Therapy Treatment Patient Details Name: [name] MRN: [id number] DOB: 06/16/1950 Today's Date: 04/04/2013 Time: 1025-1049 PT Time Calculation (min): 24 min  PT Assessment / Plan / Recommendation  History of Present Illness Patient is a 62 year old female with a history of Crohn's colitis, status post total abdominal colectomy and end ileostomy at [facility] in 2007.  She recently presented with acute small bowel obstruction. Pt underwent REPAIR OF INCISIONAL AND PARASTOMAL HERNIAS, LYSIS OF ADHESIONS FOR SMALL BOWEL OBSTRUCTION and INSERTION OF BIOLOGIC MESH. Pt presents this admit for    PT Comments   Pt making great progress this session with good increase in ambulatory distance. Depending on length of stay pt may progress to modified independent level at which she would be able to go home. She lives alone however and will have to be modified independent with all mobility unless sister can go home with her. SNF will be needed if pt does not meet modified independent goals.   Follow Up Recommendations  SNF;Supervision/Assistance - 24 hour;Home health PT (vs)        Barriers to Discharge  Decreased caregiver support       Equipment Recommendations  None recommended by PT       Frequency Min 3X/week   Progress towards PT Goals  Progressing  Plan Discharge plan needs to be updated    Precautions / Restrictions Precautions Precautions: Other (comment) (drains) Restrictions Weight Bearing Restrictions: No   Pertinent Vitals/Pain No c/o pain    Mobility  Bed Mobility Bed Mobility: Scooting to HOB;Rolling Right;Right Sidelying to Sit Rolling Right: 5: Supervision;With rail Right Sidelying to Sit: 5: Supervision Details for Bed Mobility Assistance: vc's for technique and sequencing. Pt slow to move but does not need physical assist Transfers Transfers: Sit to Stand;Stand to Sit Sit to Stand: 4: Min assist;From chair/3-in-1;From bed Stand to Sit: 4: Min guard;With  upper extremity assist;With armrests;To chair/3-in-1 Details for Transfer Assistance: Loss of balance with first stand, min assist to correct. Cues for UE placement Ambulation/Gait Ambulation/Gait Assistance: Not tested (comment);4: Min assist Ambulation Distance (Feet): 220 Feet Assistive device: Other (Comment) (refused RW, utilized IV pole) Ambulation/Gait Assistance Details: Pt educated on using RW instead of IV pole, pt declined. Overall good steady pace, no overt loss of balance Gait Pattern: Step-through pattern;Decreased stride length;Trunk flexed      PT Goals (current goals can now be found in the care plan section)    Visit Information  Last PT Received On: 04/04/13 Assistance Needed: +1 History of Present Illness: Patient is a 62 year old female with a history of Crohn's colitis, status post total abdominal colectomy and end ileostomy at [facility] in 2007.  She recently presented with acute small bowel obstruction. Pt underwent REPAIR OF INCISIONAL AND PARASTOMAL HERNIAS, LYSIS OF ADHESIONS FOR SMALL BOWEL OBSTRUCTION and INSERTION OF BIOLOGIC MESH. Pt presents this admit for     Subjective Data   "I'm ready to walk"   Cognition  Cognition Arousal/Alertness: Awake/alert Behavior During Therapy: WFL for tasks assessed/performed Overall Cognitive Status: Within Functional Limits for tasks assessed       End of Session PT - End of Session Equipment Utilized During Treatment: Oxygen Activity Tolerance: Patient tolerated treatment well Patient left: with call bell/phone within reach;in chair Nurse Communication: Mobility status;Other (comment) (NPO status change by MD who arrived during session)   GP     Wilhemina Bonito 04/04/2013, 11:02 AM

## 2013-04-04 NOTE — Progress Notes (Signed)
Needs other areas drained.

## 2013-04-04 NOTE — Progress Notes (Signed)
OT Cancellation Note and Discharge  Patient Details Name: [name] MRN: [id number] DOB: [birth year]   Cancelled Treatment:    Reason Eval/Treat Not Completed: SLP screened, no needs identified, will sign off. Pt feels like she can manage her BADLs with her sister's A initially; however would like to work on IADLs in her her own home environment. Will not see acutely, but recommend HHOT. Acute OT will sign off.  Evette Georges [phone] 04/04/2013, 2:31 PM

## 2013-04-04 NOTE — Progress Notes (Signed)
Patient ID: [name], female   DOB: 07-17-1950, 62 y.o.   MRN: [id number]  6cc of serous fluid aspirated from midabdominal seroma.  Pt tolerated well.  No bleeding.  2x2 guaze dressing applied.

## 2013-04-04 NOTE — Progress Notes (Signed)
[facility]   Subjective: No new complaints   Antibiotics:  Anti-infectives   Start     Dose/Rate Route Frequency Ordered Stop   03/31/13 1400  meropenem (MERREM) 1 g in sodium chloride 0.9 % 100 mL IVPB     1 g 200 mL/hr over 30 Minutes Intravenous 3 times per day 03/31/13 1231     03/31/13 0700  vancomycin (VANCOCIN) 500 mg in sodium chloride 0.9 % 100 mL IVPB     500 mg 100 mL/hr over 60 Minutes Intravenous Every 12 hours 03/31/13 0628 04/01/13 2104   03/27/13 2200  piperacillin-tazobactam (ZOSYN) IVPB 3.375 g  Status:  Discontinued     3.375 g 12.5 mL/hr over 240 Minutes Intravenous 3 times per day 03/27/13 1530 03/31/13 1204   03/27/13 2200  hydroxychloroquine (PLAQUENIL) tablet 200 mg     200 mg Oral 2 times daily 03/27/13 1915     03/27/13 1430  piperacillin-tazobactam (ZOSYN) IVPB 3.375 g     3.375 g 100 mL/hr over 30 Minutes Intravenous  Once 03/27/13 1415 03/27/13 1514   03/27/13 0300  vancomycin (VANCOCIN) 500 mg in sodium chloride 0.9 % 100 mL IVPB  Status:  Discontinued     500 mg 100 mL/hr over 60 Minutes Intravenous Every 12 hours 03/27/13 2351 03/31/13 0628   03/27/13 0100  vancomycin (VANCOCIN) 500 mg in sodium chloride 0.9 % 100 mL IVPB  Status:  Discontinued     500 mg 100 mL/hr over 60 Minutes Intravenous Every 12 hours 03/27/13 2227 03/27/13 2340      Medications: Scheduled Meds: . arformoterol  15 mcg Nebulization BID  . budesonide  0.25 mg Nebulization BID  . citalopram  20 mg Oral Daily  . diltiazem  30 mg Oral Q8H  . enoxaparin (LOVENOX) injection  40 mg Subcutaneous Q24H  . famotidine  20 mg Oral BID  . feeding supplement (RESOURCE BREEZE)  1 Container Oral TID BM  . fluticasone  2 spray Each Nare Daily  . guaiFENesin  1,200 mg Oral BID  . hydroxychloroquine  200 mg Oral BID  . insulin aspart  0-9 Units Subcutaneous TID WC  . meropenem (MERREM) IV  1 g Intravenous Q8H  . nystatin   Topical BID  . sodium  chloride  10-40 mL Intracatheter Q12H  . sodium chloride  3 mL Intravenous Q12H  . tiotropium  18 mcg Inhalation Daily   Continuous Infusions: . sodium chloride 50 mL/hr at 04/03/13 0448  . dextrose 5 % and 0.9% NaCl 50 mL/hr at 04/04/13 0823   PRN Meds:.acetaminophen, HYDROcodone-acetaminophen, iohexol, LORazepam, morphine injection, ondansetron (ZOFRAN) IV, sodium chloride   Objective: Weight change:   Intake/Output Summary (Last 24 hours) at 04/04/13 1503 Last data filed at 04/04/13 0932  Gross per 24 hour  Intake    300 ml  Output    800 ml  Net   -500 ml   Blood pressure 135/70, pulse 88, temperature 97.9 F (36.6 C), temperature source Oral, resp. rate 17, height 5\' 1"  (1.549 m), weight 126 lb 12.2 oz (57.5 kg), SpO2 100.00%. Temp:  [97.8 F (36.6 C)-97.9 F (36.6 C)] 97.9 F (36.6 C) (11/10 0645) Pulse Rate:  [88-101] 88 (11/10 0645) Resp:  [17-18] 17 (11/10 0645) BP: (125-135)/(65-70) 135/70 mmHg (11/10 0645) SpO2:  [99 %-100 %] 100 % (11/10 1000)  Physical Exam: General: Alert and awake, oriented x3, not in any acute distress.  HEENT: anicteric sclera,, EOMI,  She did  not wish to be examined because she was naked Neuro: nofocal  Lab Results:  Recent Labs  04/02/13 0500 04/04/13 0925  WBC 11.6* 10.3  HGB 8.4* 8.9*  HCT 25.6* 27.5*  PLT 406* 423*    BMET  Recent Labs  04/02/13 0500 04/03/13 0500  NA 142  --   K 3.3*  --   CL 111  --   CO2 23  --   GLUCOSE 82  --   BUN 3*  --   CREATININE 0.80 0.75  CALCIUM 8.4  --     Micro Results: Recent Results (from the past 240 hour(s))  URINE CULTURE     Status: None   Collection Time    03/27/13  1:53 PM      Result Value Range Status   Specimen Description URINE, CLEAN CATCH   Final   Special Requests NONE   Final   Culture  Setup Time     Final   Value: 03/27/2013 21:50     Performed at [facility] Count     Final   Value: 15,000 COLONIES/ML     Performed at [facility]   Culture     Final   Value: YEAST     Performed at [facility]   Report Status 03/28/2013 FINAL   Final  CULTURE, BLOOD (ROUTINE X 2)     Status: None   Collection Time    03/27/13  1:55 PM      Result Value Range Status   Specimen Description BLOOD PICC LINE   Final   Special Requests BOTTLES DRAWN AEROBIC AND ANAEROBIC 10CC   Final   Culture  Setup Time     Final   Value: 03/27/2013 21:48     Performed at [facility]   Culture     Final   Value: NO GROWTH 5 DAYS     Performed at [facility]   Report Status 04/02/2013 FINAL   Final  CULTURE, BLOOD (ROUTINE X 2)     Status: None   Collection Time    03/27/13  3:25 PM      Result Value Range Status   Specimen Description BLOOD RIGHT HAND   Final   Special Requests     Final   Value: BOTTLES DRAWN AEROBIC AND ANAEROBIC 10CC BLUE,5CC RED   Culture  Setup Time     Final   Value: 03/27/2013 21:48     Performed at [facility]   Culture     Final   Value: NO GROWTH 5 DAYS     Performed at [facility]   Report Status 04/02/2013 FINAL   Final  MRSA PCR SCREENING     Status: None   Collection Time    03/27/13  7:17 PM      Result Value Range Status   MRSA by PCR NEGATIVE  NEGATIVE Final   Comment:            The GeneXpert MRSA Assay (FDA     approved for NASAL specimens     only), is one component of a     comprehensive MRSA colonization     surveillance program. It is not     intended to diagnose MRSA     infection nor to guide or     monitor treatment for     MRSA infections.  CULTURE, ROUTINE-ABSCESS     Status: None  Collection Time    03/28/13  3:27 PM      Result Value Range Status   Specimen Description ABSCESS BUTTOCKS RIGHT   Final   Special Requests NONE   Final   Gram Stain     Final   Value: MODERATE WBC PRESENT,BOTH PMN AND MONONUCLEAR     NO SQUAMOUS EPITHELIAL CELLS SEEN     ABUNDANT GRAM NEGATIVE RODS     Performed at [facility]   Culture      Final   Value: NO GROWTH 3 DAYS     Performed at [facility]   Report Status 04/01/2013 FINAL   Final  ANAEROBIC CULTURE     Status: None   Collection Time    03/28/13  3:27 PM      Result Value Range Status   Specimen Description ABSCESS BUTTOCKS RIGHT   Final   Special Requests NONE   Final   Gram Stain     Final   Value: MODERATE WBC PRESENT,BOTH PMN AND MONONUCLEAR     NO SQUAMOUS EPITHELIAL CELLS SEEN     ABUNDANT GRAM NEGATIVE RODS     Performed at [facility]   Culture     Final   Value: MODERATE BACTEROIDES SPECIES     Note: BETA LACTAMASE POSITIVE     Performed at [facility]   Report Status 04/02/2013 FINAL   Final    Studies/Results: Ct Abdomen Pelvis W Contrast  04/03/2013   CLINICAL DATA:  FOLLOW UP pelvic drain for clearing of abscess  EXAM: CT ABDOMEN AND PELVIS WITH CONTRAST  TECHNIQUE: Multidetector CT imaging of the abdomen and pelvis was performed using the standard protocol following bolus administration of intravenous contrast.  CONTRAST:  OMNIPAQUE IOHEXOL 300 MG/ML  SOLN  COMPARISON:  03/27/2013.  FINDINGS: There is a trace right pleural effusion with basilar atelectasis. There is a small left pleural effusion.  The liver demonstrates no focal abnormality. There is no intrahepatic or extrahepatic biliary ductal dilatation. The gallbladder is normal. The spleen demonstrates no focal abnormality. The kidneys, adrenal glands and pancreas are normal. The bladder is unremarkable.  There are surgical clips in the epigastric region. The stomach, duodenum, small intestine, and large intestine demonstrate no contrast extravasation or dilatation. There is a right lower quadrant enterostomy. There is a pigtail catheter present within the pelvis with and near complete resolution of the complex pelvic fluid collection with only a tiny focus remaining measuring approximately 8.6 mm. There is a persistent small fluid collection with rim enhancement  in the right upper anterior abdomen measuring 5 x 1 cm on image 38 series 2. This is essentially unchanged from the prior exam. There is a 3.9 x 3.5 cm left periumbilical fluid collection within the subcutaneous fat likely representing a postoperative seroma. There is no pneumoperitoneum, pneumatosis, or portal venous gas. There is no abdominal or pelvic free fluid. There is no lymphadenopathy.  The abdominal aorta is normal in caliber with atherosclerosis.  There are no lytic or sclerotic osseous lesions.  IMPRESSION: 1. There is a pelvic pigtail drainage catheter present with near complete interval resolution of the complex pelvic fluid collection with only a small focus remaining measuring 8.6 mm.  2. There is a persistent small fluid collection with rim enhancement in the right upper anterior abdomen measuring 5 x 1 cm most concerning for an abscess.  3. Left periumbilical fluid collection within the subcutaneous fat most consistent with a postoperative seroma.  Electronically Signed   By: Elige Ko   On: 04/03/2013 16:50      Assessment/Plan: [name] is a 62 y.o. female with   Crohn's disease diabetes mellitus recently discharged from the hospital after undergoing laparotomy for adhesions on 10/22. Post op she was found to have MRSA Bacteremia in one set of blood cultures. TTE and TEE did not show any vegetations. She was discharged on IV vancomycin, now readmitted with intra-abdominal abscess currently on vancomycin and Zosyn with plans for IR drainage yesterday with GNR growing on culture  #1 Intrabdominal abscesses: one with drain has resolved but there is a persistent 5 x 1 cm abscess in right side and peri-umbilical fluid collection. CCS has aspirated the peri-umbilical area  --I would recommend drainage of the 5x1cm area and sending this for culture as well  #2 MRSA Bacteremia: no vegetations on TEE she has finished therapy, will get surveillance cultures in 2 weeks from her  last dose of vanco    LOS: 8 days   Acey Lav 04/04/2013, 3:03 PM

## 2013-04-04 NOTE — Progress Notes (Signed)
Subjective: Pt denies chills or sweats, abd pain is about the same.    Objective: Vital signs in last 24 hours: Temp:  [97.8 F (36.6 C)-99.1 F (37.3 C)] 97.9 F (36.6 C) (11/10 0645) Pulse Rate:  [88-101] 88 (11/10 0645) Resp:  [17-20] 17 (11/10 0645) BP: (125-145)/(56-70) 135/70 mmHg (11/10 0645) SpO2:  [99 %-100 %] 99 % (11/10 0645) Last BM Date: 04/03/13  Intake/Output from previous day: 11/09 0701 - 11/10 0700 In: 480 [P.O.:480] Out: 1200 [Urine:900; Stool:300] Intake/Output this shift:    General appearance: alert, cooperative and no distress Resp: clear to auscultation bilaterally Cardio: regular rate and rhythm, S1, S2 normal, no murmur, click, rub or gallop GI: +bs abdoomen is soft and non distended.  TTP around ostomy, RLQ and epigastric region.  R side ostomy is pink and viable, stool in bag.  Drain with sanguinous output. Extremities: extremities normal, atraumatic, no cyanosis or edema  Lab Results:   Recent Labs  04/01/13 1300 04/02/13 0500  WBC 11.3* 11.6*  HGB 8.6* 8.4*  HCT 26.2* 25.6*  PLT 392 406*   BMET  Recent Labs  04/02/13 0500 04/03/13 0500  NA 142  --   K 3.3*  --   CL 111  --   CO2 23  --   GLUCOSE 82  --   BUN 3*  --   CREATININE 0.80 0.75  CALCIUM 8.4  --    PT/INR No results found for this basename: LABPROT, INR,  in the last 72 hours ABG No results found for this basename: PHART, PCO2, PO2, HCO3,  in the last 72 hours  Studies/Results: Ct Abdomen Pelvis W Contrast  04/03/2013   CLINICAL DATA:  FOLLOW UP pelvic drain for clearing of abscess  EXAM: CT ABDOMEN AND PELVIS WITH CONTRAST  TECHNIQUE: Multidetector CT imaging of the abdomen and pelvis was performed using the standard protocol following bolus administration of intravenous contrast.  CONTRAST:  OMNIPAQUE IOHEXOL 300 MG/ML  SOLN  COMPARISON:  03/27/2013.  FINDINGS: There is a trace right pleural effusion with basilar atelectasis. There is a small left pleural  effusion.  The liver demonstrates no focal abnormality. There is no intrahepatic or extrahepatic biliary ductal dilatation. The gallbladder is normal. The spleen demonstrates no focal abnormality. The kidneys, adrenal glands and pancreas are normal. The bladder is unremarkable.  There are surgical clips in the epigastric region. The stomach, duodenum, small intestine, and large intestine demonstrate no contrast extravasation or dilatation. There is a right lower quadrant enterostomy. There is a pigtail catheter present within the pelvis with and near complete resolution of the complex pelvic fluid collection with only a tiny focus remaining measuring approximately 8.6 mm. There is a persistent small fluid collection with rim enhancement in the right upper anterior abdomen measuring 5 x 1 cm on image 38 series 2. This is essentially unchanged from the prior exam. There is a 3.9 x 3.5 cm left periumbilical fluid collection within the subcutaneous fat likely representing a postoperative seroma. There is no pneumoperitoneum, pneumatosis, or portal venous gas. There is no abdominal or pelvic free fluid. There is no lymphadenopathy.  The abdominal aorta is normal in caliber with atherosclerosis.  There are no lytic or sclerotic osseous lesions.  IMPRESSION: 1. There is a pelvic pigtail drainage catheter present with near complete interval resolution of the complex pelvic fluid collection with only a small focus remaining measuring 8.6 mm.  2. There is a persistent small fluid collection with rim enhancement in the  right upper anterior abdomen measuring 5 x 1 cm most concerning for an abscess.  3. Left periumbilical fluid collection within the subcutaneous fat most consistent with a postoperative seroma.   Electronically Signed   By: Elige Ko   On: 04/03/2013 16:50    Anti-infectives: Anti-infectives   Start     Dose/Rate Route Frequency Ordered Stop   03/31/13 1400  meropenem (MERREM) 1 g in sodium chloride 0.9  % 100 mL IVPB     1 g 200 mL/hr over 30 Minutes Intravenous 3 times per day 03/31/13 1231     03/31/13 0700  vancomycin (VANCOCIN) 500 mg in sodium chloride 0.9 % 100 mL IVPB     500 mg 100 mL/hr over 60 Minutes Intravenous Every 12 hours 03/31/13 0628 04/01/13 2104   03/27/13 2200  piperacillin-tazobactam (ZOSYN) IVPB 3.375 g  Status:  Discontinued     3.375 g 12.5 mL/hr over 240 Minutes Intravenous 3 times per day 03/27/13 1530 03/31/13 1204   03/27/13 2200  hydroxychloroquine (PLAQUENIL) tablet 200 mg     200 mg Oral 2 times daily 03/27/13 1915     03/27/13 1430  piperacillin-tazobactam (ZOSYN) IVPB 3.375 g     3.375 g 100 mL/hr over 30 Minutes Intravenous  Once 03/27/13 1415 03/27/13 1514   03/27/13 0300  vancomycin (VANCOCIN) 500 mg in sodium chloride 0.9 % 100 mL IVPB  Status:  Discontinued     500 mg 100 mL/hr over 60 Minutes Intravenous Every 12 hours 03/27/13 2351 03/31/13 0628   03/27/13 0100  vancomycin (VANCOCIN) 500 mg in sodium chloride 0.9 % 100 mL IVPB  Status:  Discontinued     500 mg 100 mL/hr over 60 Minutes Intravenous Every 12 hours 03/27/13 2227 03/27/13 2340      Assessment/Plan: S/p laparotomy LOA, ventral hernia repair(Dr. Johna Sheriff 03/16/13) Anemia COPD Sepsis Pelvic abscess Right upper abdomen 5x1cm fluid collection  CT yesterday shows a near resolution of the pelvic abscess. Persistent right upper anterior abdomen fluid collection, concerning for an abscess Repeat CBC now Will await CBC results and discuss perc drain placement NPO for now, change IVF to D5%.  Im not sure why the patient is on insulin, last A1C 5.6%.  I will discuss with IM if okay to stop insulin and monitor CBGs.    LOS: 8 days    Tracy Gerken ANP-BC 04/04/2013 8:16 AM

## 2013-04-04 NOTE — Progress Notes (Signed)
ANTIBIOTIC CONSULT NOTE - FOLLOW UP  Pharmacy Consult for meropenem Indication: intrabdominal abscess  Allergies  Allergen Reactions  . Esomeprazole Magnesium Other (See Comments)    NEXIUM - reaction > aggravated pt's Crohn's disease  . Other     Beans, Dander/Dust, Peas, Mushrooms  . Peanut-Containing Drug Products Other (See Comments)    unknown  . Shellfish Allergy Other (See Comments)    unknown  . Surgical Lubricant Other (See Comments)    Burns skin     Patient Measurements: Height: 5\' 1"  (154.9 cm) Weight: 126 lb 12.2 oz (57.5 kg) IBW/kg (Calculated) : 47.8  Vital Signs: Temp: 97.9 F (36.6 C) (11/10 0645) BP: 135/70 mmHg (11/10 0645) Pulse Rate: 88 (11/10 0645) Intake/Output from previous day: 11/09 0701 - 11/10 0700 In: 480 [P.O.:480] Out: 1200 [Urine:900; Stool:300] Intake/Output from this shift: Total I/O In: 60 [P.O.:60] Out: -   Labs:  Recent Labs  04/01/13 1300 04/02/13 0500 04/03/13 0500 04/04/13 0925  WBC 11.3* 11.6*  --  10.3  HGB 8.6* 8.4*  --  8.9*  PLT 392 406*  --  423*  CREATININE  --  0.80 0.75  --    Estimated Creatinine Clearance: 58.7 ml/min (by C-G formula based on Cr of 0.75). No results found for this basename: VANCOTROUGH, VANCOPEAK, VANCORANDOM, GENTTROUGH, GENTPEAK, GENTRANDOM, TOBRATROUGH, TOBRAPEAK, TOBRARND, AMIKACINPEAK, AMIKACINTROU, AMIKACIN,  in the last 72 hours   Assessment: 37 YOF who recently underwent laparotomy for adhesions and was discharged on IV vancomycin for MRSA bacteremia. She was readmitted with intra-abdominal abscess and changed to meropenem per ID. No growth on cultures. Renal function remains stable.  Goal of Therapy:  Vancomycin trough level 15-20 mcg/ml  Plan:  1. meropenem 1 gm q8h. Planned stop date of 11/16 for full 14 days of therapy- not yet entered 2. Will follow renal function, clinical progression/dispo, any changes to ID recommendations, LOT  [name], PharmD Clinical  Pharmacist Pager: [phone] 04/04/2013 11:05 AM

## 2013-04-05 LAB — CBC
HCT: 27 % — ABNORMAL LOW (ref 36.0–46.0)
MCH: 27.1 pg (ref 26.0–34.0)
MCHC: 31.9 g/dL (ref 30.0–36.0)
MCV: 85.2 fL (ref 78.0–100.0)
Platelets: 413 10*3/uL — ABNORMAL HIGH (ref 150–400)
RDW: 14.1 % (ref 11.5–15.5)
WBC: 10.3 10*3/uL (ref 4.0–10.5)

## 2013-04-05 LAB — GLUCOSE, CAPILLARY
Glucose-Capillary: 100 mg/dL — ABNORMAL HIGH (ref 70–99)
Glucose-Capillary: 118 mg/dL — ABNORMAL HIGH (ref 70–99)

## 2013-04-05 MED ORDER — SIMETHICONE 80 MG PO CHEW
80.0000 mg | CHEWABLE_TABLET | Freq: Four times a day (QID) | ORAL | Status: DC | PRN
  Administered 2013-04-05: 80 mg via ORAL
  Filled 2013-04-05: qty 1

## 2013-04-05 NOTE — Progress Notes (Signed)
[facility] PROGRESS NOTE  [name] RUE:[id number] DOB: [birth year] DOA: 03/27/2013 PCP: [name], MD  Brief narrative:  62 y.o. female with hx of COPD on home o2 ( 2.5 L), DM, HL, osteoporosis, RA, asthma, who was recently discharged from the hospital after undergoing laparotomy for adhesions on 10/22. Post op she was found to have MRSA Bacteremia in one set of blood cultures. TTE and TEE did not show any vegetations. She was discharged on IV vancomycin to complete a 14 day course. She got a PICC line before discharge,. She returned to the ED on 11/2 with fever chills, mild abdominal discomfort and nausea, without vomiting. On arrival to ED, she was found to be febrile , hypotensive and tachycardic. Labs revealed leukocytosis. A CXR was negative for pneumonia . CT abdomen and pelvis showed pelvic abscess.  [facility] consulting on medical issues.   Assessment/Plan:  Pelvic abscess  . Drain placed by IR on 11/3 , removed today -cx growing GNR. ( moderate bacteroides and beta lactamase pos).  -  D/ced vancomycin on 11/7. Meropenem for total 2 weeks course ( until 11/16)  Afebrile    S/p recent laparotomy with LOA and ventral hernia repair  primary team following  - repeat abd CT done on 11/9 showing persistent right upper abdomen fluid collection suggestive of abscess. Patient had persistent abdominal tenderness without fever or leucocytosis. Aspirated 6 cc of fluid from mid abdomen seroma. -has chronic abd pain  Anemia  -transfused 2 units PRBCs  -hb current in 8s  -baseline Hgb ~ 10-11  likely in the setting of recent sx and underlying sepsis. No active blood loss noted.   HTN  on low dose cardizem   DIABETES, TYPE 2  -stable . Continue SSI  resume victoza upon d/c  MULTIFOCAL ATRIAL TACHYCARDIA  -recent admission for this  -HR currently controlled  -on low dose cardizem   Chronic hypoxic respiratory failure/Gold stage D. COPD  -stable - followed  by [facility]  -On chronic o2 via [place]. continue inhalers and prn nebs   Hypokalemia  relpenished   Rheumatoid arthritis  -on Plaquenil   DVT prophylaxis: sq Lovenox  Code Status: Full   Procedures:  Insertion of percutaneous drain to pelvic abscess 11/3  Disposition : plan for d/c home tomorrow  Antibiotics:  Zosyn 11/2 >>> 11/5  Meropenem (11/6>>until 11/16)  Vancomycin 11/2 >>> 11/7    HPI/Subjective: No overnight issues. pelvic drain removed by IR today  Objective: Filed Vitals:   04/05/13 1342  BP: 124/60  Pulse: 90  Temp: 98.6 F (37 C)  Resp: 16    Intake/Output Summary (Last 24 hours) at 04/05/13 1505 Last data filed at 04/05/13 0857  Gross per 24 hour  Intake    240 ml  Output      5 ml  Net    235 ml   Filed Weights   03/27/13 1300 03/27/13 1919 03/30/13 0442  Weight: 65.6 kg (144 lb 10 oz) 58.2 kg (128 lb 4.9 oz) 57.5 kg (126 lb 12.2 oz)    Exam: General: Elderly female in NAD,  chest : clear b/l, no added sounds  CVS: NS1 and s2, No added sounds  Respiratory: clear b/l, no added sounds  abd: soft, laparotomy scar, ostomy in place . Pelvic drain removed, BS+,abdominal tenderness present. ( less than previous days)  Ext: warm, no edema  CNS: AAOX3  Data Reviewed: Basic Metabolic Panel:  Recent Labs Lab 03/30/13 0530 03/31/13 0509 04/01/13 0527  04/02/13 0500 04/03/13 0500  NA 142 143 141 142  --   K 3.3* 4.6 3.7 3.3*  --   CL 112 115* 111 111  --   CO2 23 17* 21 23  --   GLUCOSE 91 76 76 82  --   BUN 5* 4* 3* 3*  --   CREATININE 0.93 0.91 0.81 0.80 0.75  CALCIUM 7.5* 8.0* 8.5 8.4  --    Liver Function Tests: No results found for this basename: AST, ALT, ALKPHOS, BILITOT, PROT, ALBUMIN,  in the last 168 hours No results found for this basename: LIPASE, AMYLASE,  in the last 168 hours No results found for this basename: AMMONIA,  in the last 168 hours CBC:  Recent Labs Lab 03/31/13 0509 04/01/13 1300 04/02/13 0500  04/04/13 0925 04/05/13 0530  WBC 9.8 11.3* 11.6* 10.3 10.3  HGB 9.1* 8.6* 8.4* 8.9* 8.6*  HCT 27.5* 26.2* 25.6* 27.5* 27.0*  MCV 84.4 84.5 83.9 84.1 85.2  PLT 390 392 406* 423* 413*   Cardiac Enzymes: No results found for this basename: CKTOTAL, CKMB, CKMBINDEX, TROPONINI,  in the last 168 hours BNP (last 3 results) No results found for this basename: PROBNP,  in the last 8760 hours CBG:  Recent Labs Lab 04/04/13 0954 04/04/13 1153 04/04/13 1704 04/05/13 0748 04/05/13 1155  GLUCAP 75 118* 128* 86 100*    Recent Results (from the past 240 hour(s))  URINE CULTURE     Status: None   Collection Time    03/27/13  1:53 PM      Result Value Range Status   Specimen Description URINE, CLEAN CATCH   Final   Special Requests NONE   Final   Culture  Setup Time     Final   Value: 03/27/2013 21:50     Performed at [facility] Count     Final   Value: 15,000 COLONIES/ML     Performed at [facility]   Culture     Final   Value: YEAST     Performed at [facility]   Report Status 03/28/2013 FINAL   Final  CULTURE, BLOOD (ROUTINE X 2)     Status: None   Collection Time    03/27/13  1:55 PM      Result Value Range Status   Specimen Description BLOOD PICC LINE   Final   Special Requests BOTTLES DRAWN AEROBIC AND ANAEROBIC 10CC   Final   Culture  Setup Time     Final   Value: 03/27/2013 21:48     Performed at [facility]   Culture     Final   Value: NO GROWTH 5 DAYS     Performed at [facility]   Report Status 04/02/2013 FINAL   Final  CULTURE, BLOOD (ROUTINE X 2)     Status: None   Collection Time    03/27/13  3:25 PM      Result Value Range Status   Specimen Description BLOOD RIGHT HAND   Final   Special Requests     Final   Value: BOTTLES DRAWN AEROBIC AND ANAEROBIC 10CC BLUE,5CC RED   Culture  Setup Time     Final   Value: 03/27/2013 21:48     Performed at [facility]   Culture     Final   Value: NO GROWTH  5 DAYS     Performed at [facility]   Report Status 04/02/2013 FINAL  Final  MRSA PCR SCREENING     Status: None   Collection Time    03/27/13  7:17 PM      Result Value Range Status   MRSA by PCR NEGATIVE  NEGATIVE Final   Comment:            The GeneXpert MRSA Assay (FDA     approved for NASAL specimens     only), is one component of a     comprehensive MRSA colonization     surveillance program. It is not     intended to diagnose MRSA     infection nor to guide or     monitor treatment for     MRSA infections.  CULTURE, ROUTINE-ABSCESS     Status: None   Collection Time    03/28/13  3:27 PM      Result Value Range Status   Specimen Description ABSCESS BUTTOCKS RIGHT   Final   Special Requests NONE   Final   Gram Stain     Final   Value: MODERATE WBC PRESENT,BOTH PMN AND MONONUCLEAR     NO SQUAMOUS EPITHELIAL CELLS SEEN     ABUNDANT GRAM NEGATIVE RODS     Performed at [facility]   Culture     Final   Value: NO GROWTH 3 DAYS     Performed at [facility]   Report Status 04/01/2013 FINAL   Final  ANAEROBIC CULTURE     Status: None   Collection Time    03/28/13  3:27 PM      Result Value Range Status   Specimen Description ABSCESS BUTTOCKS RIGHT   Final   Special Requests NONE   Final   Gram Stain     Final   Value: MODERATE WBC PRESENT,BOTH PMN AND MONONUCLEAR     NO SQUAMOUS EPITHELIAL CELLS SEEN     ABUNDANT GRAM NEGATIVE RODS     Performed at [facility]   Culture     Final   Value: MODERATE BACTEROIDES SPECIES     Note: BETA LACTAMASE POSITIVE     Performed at [facility]   Report Status 04/02/2013 FINAL   Final     Studies: Ct Abdomen Pelvis W Contrast  04/03/2013   CLINICAL DATA:  FOLLOW UP pelvic drain for clearing of abscess  EXAM: CT ABDOMEN AND PELVIS WITH CONTRAST  TECHNIQUE: Multidetector CT imaging of the abdomen and pelvis was performed using the standard protocol following bolus administration of  intravenous contrast.  CONTRAST:  OMNIPAQUE IOHEXOL 300 MG/ML  SOLN  COMPARISON:  03/27/2013.  FINDINGS: There is a trace right pleural effusion with basilar atelectasis. There is a small left pleural effusion.  The liver demonstrates no focal abnormality. There is no intrahepatic or extrahepatic biliary ductal dilatation. The gallbladder is normal. The spleen demonstrates no focal abnormality. The kidneys, adrenal glands and pancreas are normal. The bladder is unremarkable.  There are surgical clips in the epigastric region. The stomach, duodenum, small intestine, and large intestine demonstrate no contrast extravasation or dilatation. There is a right lower quadrant enterostomy. There is a pigtail catheter present within the pelvis with and near complete resolution of the complex pelvic fluid collection with only a tiny focus remaining measuring approximately 8.6 mm. There is a persistent small fluid collection with rim enhancement in the right upper anterior abdomen measuring 5 x 1 cm on image 38 series 2. This is essentially unchanged from the prior exam. There  is a 3.9 x 3.5 cm left periumbilical fluid collection within the subcutaneous fat likely representing a postoperative seroma. There is no pneumoperitoneum, pneumatosis, or portal venous gas. There is no abdominal or pelvic free fluid. There is no lymphadenopathy.  The abdominal aorta is normal in caliber with atherosclerosis.  There are no lytic or sclerotic osseous lesions.  IMPRESSION: 1. There is a pelvic pigtail drainage catheter present with near complete interval resolution of the complex pelvic fluid collection with only a small focus remaining measuring 8.6 mm.  2. There is a persistent small fluid collection with rim enhancement in the right upper anterior abdomen measuring 5 x 1 cm most concerning for an abscess.  3. Left periumbilical fluid collection within the subcutaneous fat most consistent with a postoperative seroma.    Electronically Signed   By: Elige Ko   On: 04/03/2013 16:50    Scheduled Meds: . arformoterol  15 mcg Nebulization BID  . budesonide  0.25 mg Nebulization BID  . citalopram  20 mg Oral Daily  . diltiazem  30 mg Oral Q8H  . enoxaparin (LOVENOX) injection  40 mg Subcutaneous Q24H  . famotidine  20 mg Oral BID  . feeding supplement (RESOURCE BREEZE)  1 Container Oral TID BM  . fluticasone  2 spray Each Nare Daily  . guaiFENesin  1,200 mg Oral BID  . hydroxychloroquine  200 mg Oral BID  . meropenem (MERREM) IV  1 g Intravenous Q8H  . nystatin   Topical BID  . sodium chloride  10-40 mL Intracatheter Q12H  . sodium chloride  3 mL Intravenous Q12H  . tiotropium  18 mcg Inhalation Daily   Continuous Infusions:     Time spent: 25 minutes    Zandra Lajeunesse  [facility] Pager [phone]. If 7PM-7AM, please contact night-coverage at [email or web address], password [email or web address] 04/05/2013, 3:05 PM  LOS: 9 days     hospitalists will sign off. Please call for any assistance.

## 2013-04-05 NOTE — Progress Notes (Signed)
Subjective: Pt feeling about the same.  Tolerating diet.  Denies chills or sweats.  CBGs 80s with adequate oral intake and D5% fluids.    Objective: Vital signs in last 24 hours: Temp:  [98 F (36.7 C)-98.9 F (37.2 C)] 98.6 F (37 C) (11/11 0523) Pulse Rate:  [87-98] 87 (11/11 0523) Resp:  [15-16] 15 (11/11 0523) BP: (127-134)/(53-68) 127/53 mmHg (11/11 0523) SpO2:  [100 %] 100 % (11/11 0523) Last BM Date: 04/04/13  Intake/Output from previous day: 11/10 0701 - 11/11 0700 In: 343.3 [P.O.:60; I.V.:283.3] Out: 25 [Drains:25] Intake/Output this shift:   Physical Exam: General appearance: alert, cooperative and no distress  Resp: clear to auscultation bilaterally  Cardio: regular rate and rhythm, S1, S2 normal, no murmur, click, rub or gallop  GI: +bs abdoomen is soft and non distended. TTP around ostomy, RLQ and epigastric region. R side ostomy is pink and viable, stool in bag, drain. Extremities: extremities normal, atraumatic, no cyanosis or edema   Lab Results:   Recent Labs  04/04/13 0925 04/05/13 0530  WBC 10.3 10.3  HGB 8.9* 8.6*  HCT 27.5* 27.0*  PLT 423* 413*   BMET  Recent Labs  04/03/13 0500  CREATININE 0.75    Studies/Results: Ct Abdomen Pelvis W Contrast  04/03/2013   CLINICAL DATA:  FOLLOW UP pelvic drain for clearing of abscess  EXAM: CT ABDOMEN AND PELVIS WITH CONTRAST  TECHNIQUE: Multidetector CT imaging of the abdomen and pelvis was performed using the standard protocol following bolus administration of intravenous contrast.  CONTRAST:  OMNIPAQUE IOHEXOL 300 MG/ML  SOLN  COMPARISON:  03/27/2013.  FINDINGS: There is a trace right pleural effusion with basilar atelectasis. There is a small left pleural effusion.  The liver demonstrates no focal abnormality. There is no intrahepatic or extrahepatic biliary ductal dilatation. The gallbladder is normal. The spleen demonstrates no focal abnormality. The kidneys, adrenal glands and pancreas are  normal. The bladder is unremarkable.  There are surgical clips in the epigastric region. The stomach, duodenum, small intestine, and large intestine demonstrate no contrast extravasation or dilatation. There is a right lower quadrant enterostomy. There is a pigtail catheter present within the pelvis with and near complete resolution of the complex pelvic fluid collection with only a tiny focus remaining measuring approximately 8.6 mm. There is a persistent small fluid collection with rim enhancement in the right upper anterior abdomen measuring 5 x 1 cm on image 38 series 2. This is essentially unchanged from the prior exam. There is a 3.9 x 3.5 cm left periumbilical fluid collection within the subcutaneous fat likely representing a postoperative seroma. There is no pneumoperitoneum, pneumatosis, or portal venous gas. There is no abdominal or pelvic free fluid. There is no lymphadenopathy.  The abdominal aorta is normal in caliber with atherosclerosis.  There are no lytic or sclerotic osseous lesions.  IMPRESSION: 1. There is a pelvic pigtail drainage catheter present with near complete interval resolution of the complex pelvic fluid collection with only a small focus remaining measuring 8.6 mm.  2. There is a persistent small fluid collection with rim enhancement in the right upper anterior abdomen measuring 5 x 1 cm most concerning for an abscess.  3. Left periumbilical fluid collection within the subcutaneous fat most consistent with a postoperative seroma.   Electronically Signed   By: Elige Ko   On: 04/03/2013 16:50    Anti-infectives: Anti-infectives   Start     Dose/Rate Route Frequency Ordered Stop   03/31/13 1400  meropenem (MERREM) 1 g in sodium chloride 0.9 % 100 mL IVPB     1 g 200 mL/hr over 30 Minutes Intravenous 3 times per day 03/31/13 1231     03/31/13 0700  vancomycin (VANCOCIN) 500 mg in sodium chloride 0.9 % 100 mL IVPB     500 mg 100 mL/hr over 60 Minutes Intravenous Every 12  hours 03/31/13 0628 04/01/13 2104   03/27/13 2200  piperacillin-tazobactam (ZOSYN) IVPB 3.375 g  Status:  Discontinued     3.375 g 12.5 mL/hr over 240 Minutes Intravenous 3 times per day 03/27/13 1530 03/31/13 1204   03/27/13 2200  hydroxychloroquine (PLAQUENIL) tablet 200 mg     200 mg Oral 2 times daily 03/27/13 1915     03/27/13 1430  piperacillin-tazobactam (ZOSYN) IVPB 3.375 g     3.375 g 100 mL/hr over 30 Minutes Intravenous  Once 03/27/13 1415 03/27/13 1514   03/27/13 0300  vancomycin (VANCOCIN) 500 mg in sodium chloride 0.9 % 100 mL IVPB  Status:  Discontinued     500 mg 100 mL/hr over 60 Minutes Intravenous Every 12 hours 03/27/13 2351 03/31/13 0628   03/27/13 0100  vancomycin (VANCOCIN) 500 mg in sodium chloride 0.9 % 100 mL IVPB  Status:  Discontinued     500 mg 100 mL/hr over 60 Minutes Intravenous Every 12 hours 03/27/13 2227 03/27/13 2340      Assessment/Plan: S/p laparotomy LOA, ventral hernia repair(Dr. Johna Sheriff 03/16/13)  -PT/OT eval and treat -tolerating diet -aggressive pulmonary toilet -abdominal wall seroma aspirated 11/10 Anemia-stable COPD-stable  Sepsis-resolved Pelvic abscess-resolved on repeat CT, DC drain per IR Right upper abdomen 5x1cm fluid collection-unable to safely insert perc drain.  Continue with Meropenem.  Augmentin at discharge, gram negative rods on culture  Diabetes mellitus-DC D5 IVF, DC insulin, continue CBG monitoring TID.  Resume victoza at discharge Dispo-discharge tomorrow   LOS: 9 days    Teniqua Marron ANP-BC 04/05/2013 8:46 AM

## 2013-04-05 NOTE — Plan of Care (Signed)
Problem: Phase III Progression Outcomes Goal: IV changed to normal saline lock Outcome: Not Applicable Date Met:  04/05/13 Has picc line fluids at [facility]

## 2013-04-05 NOTE — Progress Notes (Signed)
Subjective: Pelvic abscess drain placed 11/3 Feeling good, optimistic about going home.   Objective: Vital signs in last 24 hours: Temp:  [98 F (36.7 C)-98.9 F (37.2 C)] 98.6 F (37 C) (11/11 0523) Pulse Rate:  [87-98] 87 (11/11 0523) Resp:  [15-16] 15 (11/11 0523) BP: (127-134)/(53-68) 127/53 mmHg (11/11 0523) SpO2:  [100 %] 100 % (11/11 0910) Last BM Date: 04/04/13  PE:  afeb VSS TG drain intact Minimal serous output  Lab Results:   Recent Labs  04/04/13 0925 04/05/13 0530  WBC 10.3 10.3  HGB 8.9* 8.6*  HCT 27.5* 27.0*  PLT 423* 413*   BMET  Recent Labs  04/03/13 0500  CREATININE 0.75   PT/INR No results found for this basename: LABPROT, INR,  in the last 72 hours ABG No results found for this basename: PHART, PCO2, PO2, HCO3,  in the last 72 hours  Studies/Results: Ct Abdomen Pelvis W Contrast  04/03/2013   CLINICAL DATA:  FOLLOW UP pelvic drain for clearing of abscess  EXAM: CT ABDOMEN AND PELVIS WITH CONTRAST  TECHNIQUE: Multidetector CT imaging of the abdomen and pelvis was performed using the standard protocol following bolus administration of intravenous contrast.  CONTRAST:  OMNIPAQUE IOHEXOL 300 MG/ML  SOLN  COMPARISON:  03/27/2013.  FINDINGS: There is a trace right pleural effusion with basilar atelectasis. There is a small left pleural effusion.  The liver demonstrates no focal abnormality. There is no intrahepatic or extrahepatic biliary ductal dilatation. The gallbladder is normal. The spleen demonstrates no focal abnormality. The kidneys, adrenal glands and pancreas are normal. The bladder is unremarkable.  There are surgical clips in the epigastric region. The stomach, duodenum, small intestine, and large intestine demonstrate no contrast extravasation or dilatation. There is a right lower quadrant enterostomy. There is a pigtail catheter present within the pelvis with and near complete resolution of the complex pelvic fluid collection with  only a tiny focus remaining measuring approximately 8.6 mm. There is a persistent small fluid collection with rim enhancement in the right upper anterior abdomen measuring 5 x 1 cm on image 38 series 2. This is essentially unchanged from the prior exam. There is a 3.9 x 3.5 cm left periumbilical fluid collection within the subcutaneous fat likely representing a postoperative seroma. There is no pneumoperitoneum, pneumatosis, or portal venous gas. There is no abdominal or pelvic free fluid. There is no lymphadenopathy.  The abdominal aorta is normal in caliber with atherosclerosis.  There are no lytic or sclerotic osseous lesions.  IMPRESSION: 1. There is a pelvic pigtail drainage catheter present with near complete interval resolution of the complex pelvic fluid collection with only a small focus remaining measuring 8.6 mm.  2. There is a persistent small fluid collection with rim enhancement in the right upper anterior abdomen measuring 5 x 1 cm most concerning for an abscess.  3. Left periumbilical fluid collection within the subcutaneous fat most consistent with a postoperative seroma.   Electronically Signed   By: Elige Ko   On: 04/03/2013 16:50    Anti-infectives: Anti-infectives   Start     Dose/Rate Route Frequency Ordered Stop   03/31/13 1400  meropenem (MERREM) 1 g in sodium chloride 0.9 % 100 mL IVPB     1 g 200 mL/hr over 30 Minutes Intravenous 3 times per day 03/31/13 1231     03/31/13 0700  vancomycin (VANCOCIN) 500 mg in sodium chloride 0.9 % 100 mL IVPB     500 mg 100 mL/hr over  60 Minutes Intravenous Every 12 hours 03/31/13 0628 04/01/13 2104   03/27/13 2200  piperacillin-tazobactam (ZOSYN) IVPB 3.375 g  Status:  Discontinued     3.375 g 12.5 mL/hr over 240 Minutes Intravenous 3 times per day 03/27/13 1530 03/31/13 1204   03/27/13 2200  hydroxychloroquine (PLAQUENIL) tablet 200 mg     200 mg Oral 2 times daily 03/27/13 1915     03/27/13 1430  piperacillin-tazobactam (ZOSYN) IVPB  3.375 g     3.375 g 100 mL/hr over 30 Minutes Intravenous  Once 03/27/13 1415 03/27/13 1514   03/27/13 0300  vancomycin (VANCOCIN) 500 mg in sodium chloride 0.9 % 100 mL IVPB  Status:  Discontinued     500 mg 100 mL/hr over 60 Minutes Intravenous Every 12 hours 03/27/13 2351 03/31/13 0628   03/27/13 0100  vancomycin (VANCOCIN) 500 mg in sodium chloride 0.9 % 100 mL IVPB  Status:  Discontinued     500 mg 100 mL/hr over 60 Minutes Intravenous Every 12 hours 03/27/13 2227 03/27/13 2340      Assessment/Plan:  TG pelvic abscess drain-removed without difficulty today Follow up CT showed pelvic abscess resolved. Upper anterior collection remains, though improved per Dr. Deanne Coffer, too small to drain. Call IR if needed.   LOS: 9 days    [name] 04/05/2013

## 2013-04-05 NOTE — Progress Notes (Addendum)
[facility]    Subjective: No new complaints   Antibiotics:  Anti-infectives   Start     Dose/Rate Route Frequency Ordered Stop   03/31/13 1400  meropenem (MERREM) 1 g in sodium chloride 0.9 % 100 mL IVPB     1 g 200 mL/hr over 30 Minutes Intravenous 3 times per day 03/31/13 1231     03/31/13 0700  vancomycin (VANCOCIN) 500 mg in sodium chloride 0.9 % 100 mL IVPB     500 mg 100 mL/hr over 60 Minutes Intravenous Every 12 hours 03/31/13 0628 04/01/13 2104   03/27/13 2200  piperacillin-tazobactam (ZOSYN) IVPB 3.375 g  Status:  Discontinued     3.375 g 12.5 mL/hr over 240 Minutes Intravenous 3 times per day 03/27/13 1530 03/31/13 1204   03/27/13 2200  hydroxychloroquine (PLAQUENIL) tablet 200 mg     200 mg Oral 2 times daily 03/27/13 1915     03/27/13 1430  piperacillin-tazobactam (ZOSYN) IVPB 3.375 g     3.375 g 100 mL/hr over 30 Minutes Intravenous  Once 03/27/13 1415 03/27/13 1514   03/27/13 0300  vancomycin (VANCOCIN) 500 mg in sodium chloride 0.9 % 100 mL IVPB  Status:  Discontinued     500 mg 100 mL/hr over 60 Minutes Intravenous Every 12 hours 03/27/13 2351 03/31/13 0628   03/27/13 0100  vancomycin (VANCOCIN) 500 mg in sodium chloride 0.9 % 100 mL IVPB  Status:  Discontinued     500 mg 100 mL/hr over 60 Minutes Intravenous Every 12 hours 03/27/13 2227 03/27/13 2340      Medications: Scheduled Meds: . arformoterol  15 mcg Nebulization BID  . budesonide  0.25 mg Nebulization BID  . citalopram  20 mg Oral Daily  . diltiazem  30 mg Oral Q8H  . enoxaparin (LOVENOX) injection  40 mg Subcutaneous Q24H  . famotidine  20 mg Oral BID  . feeding supplement (RESOURCE BREEZE)  1 Container Oral TID BM  . fluticasone  2 spray Each Nare Daily  . guaiFENesin  1,200 mg Oral BID  . hydroxychloroquine  200 mg Oral BID  . meropenem (MERREM) IV  1 g Intravenous Q8H  . nystatin   Topical BID  . sodium chloride  10-40 mL Intracatheter Q12H  . sodium  chloride  3 mL Intravenous Q12H  . tiotropium  18 mcg Inhalation Daily   Continuous Infusions:   PRN Meds:.acetaminophen, HYDROcodone-acetaminophen, iohexol, LORazepam, morphine injection, ondansetron (ZOFRAN) IV, simethicone, sodium chloride   Objective: Weight change:   Intake/Output Summary (Last 24 hours) at 04/05/13 1731 Last data filed at 04/05/13 1423  Gross per 24 hour  Intake    540 ml  Output      5 ml  Net    535 ml   Blood pressure 124/60, pulse 90, temperature 98.6 F (37 C), temperature source Oral, resp. rate 16, height 5\' 1"  (1.549 m), weight 126 lb 12.2 oz (57.5 kg), SpO2 100.00%. Temp:  [98.6 F (37 C)-98.9 F (37.2 C)] 98.6 F (37 C) (11/11 1342) Pulse Rate:  [87-98] 90 (11/11 1342) Resp:  [15-16] 16 (11/11 1342) BP: (124-134)/(53-62) 124/60 mmHg (11/11 1342) SpO2:  [100 %] 100 % (11/11 1342)  Physical Exam: General: Alert and awake, oriented x3, not in any acute distress.  HEENT: anicteric sclera,, EOMI,  GI: surgical site clean, ostomy clean Neuro: nofocal  Lab Results:  Recent Labs  04/04/13 0925 04/05/13 0530  WBC 10.3 10.3  HGB 8.9* 8.6*  HCT  27.5* 27.0*  PLT 423* 413*    BMET  Recent Labs  04/03/13 0500  CREATININE 0.75    Micro Results: Recent Results (from the past 240 hour(s))  URINE CULTURE     Status: None   Collection Time    03/27/13  1:53 PM      Result Value Range Status   Specimen Description URINE, CLEAN CATCH   Final   Special Requests NONE   Final   Culture  Setup Time     Final   Value: 03/27/2013 21:50     Performed at [facility] Count     Final   Value: 15,000 COLONIES/ML     Performed at [facility]   Culture     Final   Value: YEAST     Performed at [facility]   Report Status 03/28/2013 FINAL   Final  CULTURE, BLOOD (ROUTINE X 2)     Status: None   Collection Time    03/27/13  1:55 PM      Result Value Range Status   Specimen Description BLOOD PICC LINE   Final    Special Requests BOTTLES DRAWN AEROBIC AND ANAEROBIC 10CC   Final   Culture  Setup Time     Final   Value: 03/27/2013 21:48     Performed at [facility]   Culture     Final   Value: NO GROWTH 5 DAYS     Performed at [facility]   Report Status 04/02/2013 FINAL   Final  CULTURE, BLOOD (ROUTINE X 2)     Status: None   Collection Time    03/27/13  3:25 PM      Result Value Range Status   Specimen Description BLOOD RIGHT HAND   Final   Special Requests     Final   Value: BOTTLES DRAWN AEROBIC AND ANAEROBIC 10CC BLUE,5CC RED   Culture  Setup Time     Final   Value: 03/27/2013 21:48     Performed at [facility]   Culture     Final   Value: NO GROWTH 5 DAYS     Performed at [facility]   Report Status 04/02/2013 FINAL   Final  MRSA PCR SCREENING     Status: None   Collection Time    03/27/13  7:17 PM      Result Value Range Status   MRSA by PCR NEGATIVE  NEGATIVE Final   Comment:            The GeneXpert MRSA Assay (FDA     approved for NASAL specimens     only), is one component of a     comprehensive MRSA colonization     surveillance program. It is not     intended to diagnose MRSA     infection nor to guide or     monitor treatment for     MRSA infections.  CULTURE, ROUTINE-ABSCESS     Status: None   Collection Time    03/28/13  3:27 PM      Result Value Range Status   Specimen Description ABSCESS BUTTOCKS RIGHT   Final   Special Requests NONE   Final   Gram Stain     Final   Value: MODERATE WBC PRESENT,BOTH PMN AND MONONUCLEAR     NO SQUAMOUS EPITHELIAL CELLS SEEN     ABUNDANT GRAM NEGATIVE RODS     Performed at  [facility]     Final   Value: NO GROWTH 3 DAYS     Performed at [facility]   Report Status 04/01/2013 FINAL   Final  ANAEROBIC CULTURE     Status: None   Collection Time    03/28/13  3:27 PM      Result Value Range Status   Specimen Description ABSCESS BUTTOCKS RIGHT   Final   Special  Requests NONE   Final   Gram Stain     Final   Value: MODERATE WBC PRESENT,BOTH PMN AND MONONUCLEAR     NO SQUAMOUS EPITHELIAL CELLS SEEN     ABUNDANT GRAM NEGATIVE RODS     Performed at [facility]   Culture     Final   Value: MODERATE BACTEROIDES SPECIES     Note: BETA LACTAMASE POSITIVE     Performed at [facility]   Report Status 04/02/2013 FINAL   Final    Studies/Results: No results found.    Assessment/Plan: [name] is a 62 y.o. female with   Crohn's disease diabetes mellitus recently discharged from the hospital after undergoing laparotomy for adhesions on 10/22. Post op she was found to have MRSA Bacteremia in one set of blood cultures. TTE and TEE did not show any vegetations. She was discharged on IV vancomycin, now readmitted with intra-abdominal abscess currently on vancomycin and Zosyn with plans for IR drainage yesterday with GNR growing on culture  #1 Intrabdominal abscesses: one with drain has resolved but there is a persistent 5 x 1 cm abscess in right side  Not amenable to IR drainage/ She has been on very broad spectrum abx for 9 days. We do not know ID of the GNR   [facility] would prefer dc pt on oral antibiotics  I am comfortable and pt is comfortable changing to po augmentin  I would recommend dc her with sufficient supply for another 2 weeks with followup    #2 MRSA Bacteremia: no vegetations on TEE she has finished therapy, will get surveillance cultures in 2 weeks from her last dose of vanco  SHE HAS HSFU WITH DR. HATCHER ON Monday 04/11/13 AT 4PM  Otherwise will sign off for now  Please call with further quesstions    LOS: 9 days   Acey Lav 04/05/2013, 5:31 PM

## 2013-04-05 NOTE — Care Management Note (Signed)
  Page 1 of 1   04/05/2013     10:35:48 AM   CARE MANAGEMENT NOTE 04/05/2013  Patient:  [name], [name]   Account Number:  [id number]  Date Initiated:  03/31/2013  Documentation initiated by:  Ronny Flurry  Subjective/Objective Assessment:     Action/Plan:   Anticipated DC Date:  04/06/2013   Anticipated DC Plan:  HOME W HOME HEALTH SERVICES  In-house referral  Clinical Social Worker         Choice offered to / List presented to:  C-1 Patient        HH arranged  HH-1 RN  HH-2 PT  HH-3 OT  HH-4 NURSE'S AIDE      HH agency  [facility]   Status of service:  Completed, signed off Medicare Important Message given?   (If response is "NO", the following Medicare IM given date fields will be blank) Date Medicare IM given:   Date Additional Medicare IM given:    Discharge Disposition:    Per UR Regulation:  Reviewed for med. necessity/level of care/duration of stay  If discussed at Long Length of Stay Meetings, dates discussed:    Comments:  04-05-13 Patient wanting to be discharged home with home health , aware PT recommending SNF . Patient states her sister [name] [phone] will stay with her at discharge. Patient already has 2 canes, bedside commode, wheelchair and walker .  [address] - [address] [place]  [address] , phone [phone]  Ronny Flurry RN BSN [phone]

## 2013-04-05 NOTE — Progress Notes (Signed)
Hopefully home tomorrow.

## 2013-04-05 NOTE — Discharge Summary (Signed)
Physician Discharge Summary  [name] GNF:[id number] DOB: 08/21/1950 DOA: 03/27/2013  PCP: [name], MD  Consultation: Dr. Paulette Blanch Dam(ID)    Dr. Luisa Hart Wright(Pulmonary)     Admit date: 03/27/2013 Discharge date: 04/06/2013  Recommendations for Outpatient Follow-up:   Follow-up Information   Follow up with Mariella Saa, MD In 3 weeks. (post op check)    Specialty:  General Surgery   Contact information:   [address] [place] [address] [phone]      Discharge Diagnoses:  1. Pelvic abscess 2. Right upper anterior abdomen fluid collection 3. Diabetes mellitus 4. Sepsis 5. COPD 6. Anemia 7. Hx exploratory laparotomy with LOA and ventral hernia repair 8. Hx ileostomy    Surgical Procedure: (None this admission) Exploratory laparotomy with LOA, hernia repair and insertion of mesh(Dr. Hoxworth 03/16/13)  Discharge Condition: stable Disposition: home with 24 hour assistance and home health  Diet recommendation: carb modified   Vitals: Filed Weights   03/27/13 1300 03/27/13 1919 03/30/13 0442  Weight: 144 lb 10 oz (65.6 kg) 128 lb 4.9 oz (58.2 kg) 126 lb 12.2 oz (57.5 kg)     Filed Vitals:   04/05/13 1342 04/05/13 2204 04/06/13 0529 04/06/13 1017  BP: 124/60 117/77 120/62   Pulse: 90 104 86   Temp: 98.6 F (37 C) 98.9 F (37.2 C) 98.7 F (37.1 C)   TempSrc: Oral Oral Oral   Resp: 16 16 16    Height:      Weight:      SpO2: 100% 100% 100% 99%    Hospital Course:  [name] is a 62 year old female who was discharged from the hospital after undergoing an exploratory laparotomy on 10/22.  Post operatively, she was found to have MRSA Bacteremia, negative TEE, she was discharged home on IV vancomycin x14 days.  She presented to [facility] on the 03/27/13 with fever, chills and abdominal pain.  She was found to have leukocytosis, fever, hypotension and tachycardia.  She was admitted to SDU.  She was restarted on Vanc  and zosyn, PICC line was placed.  A CT of abdomen showed pelvic abscess.  This was treated with percutaneous drain placement.  Infectious disease was consulted to help with atbx therapy.  Wound cultures showed gram negative rods, meropenem was started.  Pulmonary added IS, flutter valve for COPD.  She was transfused with 2 units of PRBCs during the admission, h&h remained stable.  She had a repeat CT scan on the 04/03/13 which showed a resolved pelvic abscess, unchanged right upper abdomen 5x1cm fluid collection.  This collection was not drainage and therefore continued atbx therapy was continued.  She received a total of 14 days of vanc and zosyn before changing to Meropenem.  Her white count returned to normal.  She had hypokalemia which was supplemented per diem.  Her low blood pressure improved with blood and fluids.  She was restarted on a diet.  She had ostomy care.  Midline wound was healed, but had a seroma that was aspirated 2 times.  On HD #11 she was tolerating a diet, ambulating, had adequate output from ostomy and pain was under good control.  She was therefore felt stable for discharge home with [facility].  She will follow up with Dr. Johna Sheriff in 3 weeks.  We discussed warning signs that warrant immediate attention.    Discharge Instructions   Future Appointments Provider Department Dept Phone   04/08/2013 4:45 PM Mariella Saa, MD [facility] [phone]   04/11/2013 4:00 PM Ginnie Smart, MD [facility] [phone]       Medication List    STOP taking these medications       Vancomycin 750 MG/150ML Soln  Commonly known as:  VANCOCIN      TAKE these medications       amoxicillin-clavulanate 875-125 MG per tablet  Commonly known as:  AUGMENTIN  Take 1 tablet by mouth 2 (two) times daily.     arformoterol 15 MCG/2ML Nebu  Commonly known as:  BROVANA  Take 15 mcg by nebulization 2 (two) times daily.     aspirin 81 MG  tablet  Take 81 mg by mouth 3 (three) times a week. Monday, Wednesday, and Friday     budesonide 0.25 MG/2ML nebulizer solution  Commonly known as:  PULMICORT  Take 0.25 mg by nebulization 2 (two) times daily.     celecoxib 200 MG capsule  Commonly known as:  CELEBREX  Take 200 mg by mouth daily.     citalopram 20 MG tablet  Commonly known as:  CELEXA  Take 20 mg by mouth daily.     diltiazem 180 MG 24 hr capsule  Commonly known as:  CARDIZEM CD  Take 180 mg by mouth 2 (two) times daily.     famotidine 20 MG tablet  Commonly known as:  PEPCID  Take 20 mg by mouth 2 (two) times daily.     fluticasone 50 MCG/ACT nasal spray  Commonly known as:  FLONASE  Place 2 sprays into the nose daily.     furosemide 20 MG tablet  Commonly known as:  LASIX  Take 20 mg by mouth 2 (two) times daily. Once or twice daily as needed for swelling     guaiFENesin 600 MG 12 hr tablet  Commonly known as:  MUCINEX  Take 1,200 mg by mouth 2 (two) times daily.     HYDROcodone-acetaminophen 5-325 MG per tablet  Commonly known as:  NORCO/VICODIN  Take 1-2 tablets by mouth every 4 (four) hours as needed for moderate pain.     hydroxychloroquine 200 MG tablet  Commonly known as:  PLAQUENIL  Take 1 tablet by mouth Twice daily.     IRON PO  Take 1 tablet by mouth every Monday, Wednesday, and Friday.     LORazepam 0.5 MG tablet  Commonly known as:  ATIVAN  Take 0.5 mg by mouth every 8 (eight) hours as needed for anxiety.     metoprolol tartrate 25 MG tablet  Commonly known as:  LOPRESSOR  Take 12.5 mg by mouth 2 (two) times daily.     multivitamin with minerals Tabs tablet  Take 1 tablet by mouth daily.     omeprazole 40 MG capsule  Commonly known as:  PRILOSEC  Take 40 mg by mouth 2 (two) times daily.     rosuvastatin 5 MG tablet  Commonly known as:  CRESTOR  Take 5 mg by mouth every Monday, Wednesday, and Friday.     tiotropium 18 MCG inhalation capsule  Commonly known as:  SPIRIVA   Place 18 mcg into inhaler and inhale daily.     VICTOZA 18 MG/3ML Soln injection  Generic drug:  Liraglutide  Inject 1.8 mg into the skin daily. 1.8 units once daily     Vitamin D 1000 UNITS capsule  Take 1,000 Units by mouth daily.           Follow-up Information  Follow up with Mariella Saa, MD In 3 weeks. (post op check)    Specialty:  General Surgery   Contact information:   [address] [place] [address] [phone]        The results of significant diagnostics from this hospitalization (including imaging, microbiology, ancillary and laboratory) are listed below for reference.    Significant Diagnostic Studies: Dg Chest 2 View  03/27/2013   CLINICAL DATA:  Fever. Shortness breath. Abdominal pain. Postop or small bowel obstruction and hernia repair.  EXAM: CHEST  2 VIEW  COMPARISON:  03/21/2013  FINDINGS: Right arm PICC line is seen with tip overlying the superior cavoatrial junction.  Pulmonary emphysema again demonstrated. No evidence of pulmonary infiltrate or edema. No evidence of pleural effusion. Heart size is normal. No mass or lymphadenopathy identified. 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  IMPRESSION: New right arm PICC line in appropriate position.  Emphysema. No active disease.   Electronically Signed   By: Myles Rosenthal M.D.   On: 03/27/2013 14:33   Dg Chest 2 View  03/21/2013   CLINICAL DATA:  Postoperative fever, post exploratory laparotomy with lysis of adhesions and mesh ventral hernia repair on 03/16/2013  EXAM: CHEST  2 VIEW  COMPARISON:  03/15/2013  FINDINGS: Normal heart size, mediastinal contours, and pulmonary vascularity.  Atelectasis versus consolidation in medial left lower lobe.  Lungs appear emphysematous but otherwise clear.  Tiny left pleural effusion.  No pneumothorax.  Bones unremarkable.  Interval removal of nasogastric tube.  IMPRESSION: Emphysematous  changes with left lower lobe atelectasis versus consolidation and tiny left pleural effusion.   Electronically Signed   By: Ulyses Southward M.D.   On: 03/21/2013 17:33   Ct Abdomen Pelvis W Contrast  04/03/2013   CLINICAL DATA:  FOLLOW UP pelvic drain for clearing of abscess  EXAM: CT ABDOMEN AND PELVIS WITH CONTRAST  TECHNIQUE: Multidetector CT imaging of the abdomen and pelvis was performed using the standard protocol following bolus administration of intravenous contrast.  CONTRAST:  OMNIPAQUE IOHEXOL 300 MG/ML  SOLN  COMPARISON:  03/27/2013.  FINDINGS: There is a trace right pleural effusion with basilar atelectasis. There is a small left pleural effusion.  The liver demonstrates no focal abnormality. There is no intrahepatic or extrahepatic biliary ductal dilatation. The gallbladder is normal. The spleen demonstrates no focal abnormality. The kidneys, adrenal glands and pancreas are normal. The bladder is unremarkable.  There are surgical clips in the epigastric region. The stomach, duodenum, small intestine, and large intestine demonstrate no contrast extravasation or dilatation. There is a right lower quadrant enterostomy. There is a pigtail catheter present within the pelvis with and near complete resolution of the complex pelvic fluid collection with only a tiny focus remaining measuring approximately 8.6 mm. There is a persistent small fluid collection with rim enhancement in the right upper anterior abdomen measuring 5 x 1 cm on image 38 series 2. This is essentially unchanged from the prior exam. There is a 3.9 x 3.5 cm left periumbilical fluid collection within the subcutaneous fat likely representing a postoperative seroma. There is no pneumoperitoneum, pneumatosis, or portal venous gas. There is no abdominal or pelvic free fluid. There is no lymphadenopathy.  The abdominal aorta is normal in caliber with atherosclerosis.  There are no lytic or sclerotic osseous lesions.  IMPRESSION: 1. There is a  pelvic pigtail drainage catheter present with near complete interval resolution of the complex pelvic fluid  collection with only a small focus remaining measuring 8.6 mm.  2. There is a persistent small fluid collection with rim enhancement in the right upper anterior abdomen measuring 5 x 1 cm most concerning for an abscess.  3. Left periumbilical fluid collection within the subcutaneous fat most consistent with a postoperative seroma.   Electronically Signed   By: Elige Ko   On: 04/03/2013 16:50   Ct Abdomen Pelvis W Contrast  03/27/2013   CLINICAL DATA:  62 year old female with postoperative fever abdominal pain nausea and vomiting. Recent small bowel obstruction and status postoperative laparotomy for adhesions on 03/16/2013. MRSA bacteremia. Hypotension. Initial encounter.  EXAM: CT ABDOMEN AND PELVIS WITH CONTRAST  TECHNIQUE: Multidetector CT imaging of the abdomen and pelvis was performed using the standard protocol following bolus administration of intravenous contrast.  CONTRAST:  OMNIPAQUE IOHEXOL 300 MG/ML  SOLN  COMPARISON:  Abdominal radiographs 03/16/2013. Preoperative CT Abdomen and Pelvis 03/14/13.  FINDINGS: Negative lung bases. No pericardial or pleural effusion. No acute osseous abnormality identified.  Chronic changes of subtotal colectomy. Blind-ending distal colon segment, rectum and sigmoid, appears stable.  New rim enhancing fluid collection in the deep lower abdomen and pelvis encompassing 8.3 x 6.0 x 10 cm. There appears to be a small amount of mesenteric fat associated with this collection. Oral contrast was administered, but has not significantly opacified the distal small bowel. Postoperative changes to the ventral abdomen with skin staples in place. More simple appearing fluid tracking along the subcutaneous incision site (series 2, image 38 - 57). Near to the right abdominal ostomy there is a small 17 mm rim enhancing collection suspected (image 41). Subjacent to the right  ventral abdominal wall at the level of surgery there is a crescentic rim enhancing collection (image 34) encompassing 5.2 x 0.9 x 2.2 cm.  No pneumoperitoneum. Mildly dilated mid small bowel loops measuring up to 33 mm diameter. Transition seems to be in the area of the largest abscess.  Mild periportal edema in the liver. No discrete liver lesion. Gallbladder with small Phrygian cap again noted. The gallbladder fundus is located near the crescentic abscess, but not definitely involved. Portal venous system appears patent. Aortoiliac calcified atherosclerosis noted. Major arterial structures in the abdomen and pelvis are patent. Negative bladder. Possible small urethral diverticulum. Surgical clips in the epigastric region again noted. Spleen, pancreas and adrenal glands remain normal. Kidneys are stable. Right mid pole nephrolithiasis. No renal obstruction.  IMPRESSION: 1. Recent postoperative changes with a fairly large rim enhancing fluid collection in the deep lower abdomen and pelvis most compatible with abscess. See series 2, image 59. Smaller abscesses in the right upper quadrant near the ostomy (images 38, and 41). More simple appearing postoperative subcutaneous fluid along the incision site, likely seroma. No pneumoperitoneum.  2.  Mildly dilated mid small bowel loops, favor ileus.  Study discussed by telephone with hospitalist provider L. Easterwood on 03/27/2013 at 19:10 .   Electronically Signed   By: Augusto Gamble M.D.   On: 03/27/2013 19:14   Ct Abdomen Pelvis W Contrast  03/14/2013   CLINICAL DATA:  Diffuse abdominal pain. Nausea and vomiting. History of Crohn disease.  EXAM: CT ABDOMEN AND PELVIS WITH CONTRAST  TECHNIQUE: Multidetector CT imaging of the abdomen and pelvis was performed using the standard protocol following bolus administration of intravenous contrast.  CONTRAST:  OMNIPAQUE IOHEXOL 300 MG/ML  SOLN  COMPARISON:  [facility] CT ABDOMEN AND PELVIS WITH CONTRAST from  03/02/2012.  FINDINGS: No focal abnormality is seen in the liver or spleen. The stomach, duodenum, pancreas, gallbladder, and adrenal glands are unremarkable. No evidence for hydronephrosis or renal mass in either kidney. Tiny low-density cortical lesions in the kidneys bilaterally are too small to characterize but are stable and likely represent tiny cysts.  No abdominal aortic aneurysm. There is no free fluid or lymphadenopathy in the abdomen.  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  The patient has dilated small bowel loops tracking down in the central anatomic pelvis were there is mesenteric edema and interloop mesenteric fluid. Small bowel is dilated up to about 2.5 cm in diameter and can be followed to a segment the displays a small bowel feces sign immediately proximal to an abrupt transition zone. The abrupt transition zone is immediately anterior to the staple line of the Hartmann's pouch. The etiology for the obstruction at this level is most likely secondary to an adhesion. There is no overt small bowel wall thickening or gross hyper enhancement. Ileum distal to the transition zone is completely decompressed.  Bone windows reveal no worrisome lytic or sclerotic osseous lesions.  IMPRESSION: Distal small bowel obstruction with an abrupt transition zone in the mid anatomic pelvis, immediately anterior to the suture line at the blind end of the Hartmann's pouch. There is some associated perienteric edema and interloop mesenteric fluid. Imaging features are likely secondary to the presence of an adhesion.  I called these results to Dr. Anitra Lauth at approximately 1334 hours on 03/14/2013.   Electronically Signed   By: Kennith Center M.D.   On: 03/14/2013 13:35   Dg Chest Port 1 View  03/15/2013   CLINICAL DATA:  Pre-op baseline. Assess airspace disease. History of diabetes  and Crohn's disease.  EXAM: PORTABLE CHEST - 1 VIEW  COMPARISON:  Chest radiographs 12/01/2011. CT chest 12/01/2011 and abdomen 03/14/2013.  FINDINGS: 1704 hr. Nasogastric tube projects below the diaphragm. There is mild atelectasis at both lung bases. No confluent airspace opacity or pleural effusion is seen. There is linear right perihilar scarring which is unchanged. The heart size and mediastinal contours are stable. There is fragmentation and collapse of the right humeral head consistent with chronic avascular necrosis.  IMPRESSION: Mild bibasilar atelectasis. No consolidation or other significant changes identified.   Electronically Signed   By: Roxy Horseman M.D.   On: 03/15/2013 17:50   Dg Abd 2 Views  03/16/2013   CLINICAL DATA:  Small-bowel obstruction  EXAM: ABDOMEN - 2 VIEW  COMPARISON:  03/15/2013  FINDINGS: A nasogastric catheter is now noted within the stomach. There are embolization coils identified stable from the prior exam. Multiple dilated loops of small bowel are again identified. The overall appearance is slightly worse than that seen on the prior exam. This may be related in part due to more air as opposed of fluid within the bowel. Continued followup is recommended. No free air is seen. An ostomy is noted in the right lower quadrant.  IMPRESSION: Increase in the degree of gaseous distension although this may be related to some changes in the fluid dynamics within the small bowel. No free air is seen.   Electronically Signed   By: Alcide Clever M.D.   On: 03/16/2013 08:24   Dg Abd 2 Views  03/15/2013   CLINICAL DATA:  Small bowel obstruction. Distended abdomen. FOLLOW UP.  EXAM: ABDOMEN - 2  VIEW  COMPARISON:  03/14/2013  FINDINGS: Continued small bowel obstruction pattern with dilated small bowel loops and air-fluid levels. Suspect no real change since prior CT. No free air or organomegaly. No suspicious calcifications. No acute bony abnormality. Lung bases are clear.  IMPRESSION: Stable  small bowel obstruction pattern.   Electronically Signed   By: Charlett Nose M.D.   On: 03/15/2013 08:37   Dg Abd Portable 1v  04/01/2013   CLINICAL DATA:  Abdominal pain.  EXAM: PORTABLE ABDOMEN - 1 VIEW  COMPARISON:  None.  FINDINGS: There is a pelvic percutaneous drainage catheter present. There are embolization coils noted in the mid abdomen. There is no bowel dilatation to suggest obstruction. There is no evidence of pneumoperitoneum, portal venous gas or pneumatosis. There are no pathologic calcifications along the expected course of the ureters.The osseous structures are unremarkable.  IMPRESSION: Percutaneous pelvic drainage catheter in place. Otherwise unremarkable KUB.   Electronically Signed   By: Elige Ko   On: 04/01/2013 10:18   Ct Image Guided Drainage Percut Cath  Peritoneal Retroperit  03/28/2013   CLINICAL DATA:  POSTOP pelvic ABSCESS, ELEVATED WHITE COUNT, FEVER  EXAM: CT-GUIDED TRANS GLUTEAL PELVIC ABSCESS DRAIN INSERTION  Date:  11/3/201411/07/2012 3:32 PM  Radiologist:  Judie Petit. Ruel Favors, MD  Guidance:  CT  MEDICATIONS AND MEDICAL HISTORY: 4 MG VERSED, 150 MCG FENTANYL  ANESTHESIA/SEDATION: 35 MIN  CONTRAST:  NONE.  FLUOROSCOPY TIME:  NONE.  PROCEDURE: Informed consent was obtained from the patient following explanation of the procedure, risks, benefits and alternatives. The patient understands, agrees and consents for the procedure. All questions were addressed. A time out was performed.  Maximal barrier sterile technique utilized including caps, mask, sterile gowns, sterile gloves, large sterile drape, hand hygiene, and BETADINE.  Previous imaging reviewed. Patient was positioned prone. Noncontrast localization CT performed. The pelvic abscess was localized. Under sterile conditions and local anesthesia, an 18 gauge 10 cm access needle was advanced from a right trans gluteal approach into the collection. There was return of purulent fluid. Sample sent for Gram stain culture. Guidewire  inserted followed by tract dilatation to insert 10 [place] drain. Catheter position confirmed with CT. Syringe aspiration yielded 155 cc purulent fluid. Catheter is secured with a Prolene suture and connected to external suction bulb. Sterile dressing applied. No immediate complication.  COMPLICATIONS: NO IMMEDIATE  IMPRESSION: Successful CT-guided right trans gluteal pelvic abscess drain insertion   Electronically Signed   By: Ruel Favors M.D.   On: 03/28/2013 15:41    Microbiology: Recent Results (from the past 240 hour(s))  URINE CULTURE     Status: None   Collection Time    03/27/13  1:53 PM      Result Value Range Status   Specimen Description URINE, CLEAN CATCH   Final   Special Requests NONE   Final   Culture  Setup Time     Final   Value: 03/27/2013 21:50     Performed at [facility] Count     Final   Value: 15,000 COLONIES/ML     Performed at [facility]   Culture     Final   Value: YEAST     Performed at [facility]   Report Status 03/28/2013 FINAL   Final  CULTURE, BLOOD (ROUTINE X 2)     Status: None   Collection Time    03/27/13  1:55 PM      Result Value Range Status  Specimen Description BLOOD PICC LINE   Final   Special Requests BOTTLES DRAWN AEROBIC AND ANAEROBIC 10CC   Final   Culture  Setup Time     Final   Value: 03/27/2013 21:48     Performed at [facility]   Culture     Final   Value: NO GROWTH 5 DAYS     Performed at [facility]   Report Status 04/02/2013 FINAL   Final  CULTURE, BLOOD (ROUTINE X 2)     Status: None   Collection Time    03/27/13  3:25 PM      Result Value Range Status   Specimen Description BLOOD RIGHT HAND   Final   Special Requests     Final   Value: BOTTLES DRAWN AEROBIC AND ANAEROBIC 10CC BLUE,5CC RED   Culture  Setup Time     Final   Value: 03/27/2013 21:48     Performed at [facility]   Culture     Final   Value: NO GROWTH 5 DAYS     Performed at [facility]   Report Status 04/02/2013 FINAL   Final  MRSA PCR SCREENING     Status: None   Collection Time    03/27/13  7:17 PM      Result Value Range Status   MRSA by PCR NEGATIVE  NEGATIVE Final   Comment:            The GeneXpert MRSA Assay (FDA     approved for NASAL specimens     only), is one component of a     comprehensive MRSA colonization     surveillance program. It is not     intended to diagnose MRSA     infection nor to guide or     monitor treatment for     MRSA infections.  CULTURE, ROUTINE-ABSCESS     Status: None   Collection Time    03/28/13  3:27 PM      Result Value Range Status   Specimen Description ABSCESS BUTTOCKS RIGHT   Final   Special Requests NONE   Final   Gram Stain     Final   Value: MODERATE WBC PRESENT,BOTH PMN AND MONONUCLEAR     NO SQUAMOUS EPITHELIAL CELLS SEEN     ABUNDANT GRAM NEGATIVE RODS     Performed at [facility]   Culture     Final   Value: NO GROWTH 3 DAYS     Performed at [facility]   Report Status 04/01/2013 FINAL   Final  ANAEROBIC CULTURE     Status: None   Collection Time    03/28/13  3:27 PM      Result Value Range Status   Specimen Description ABSCESS BUTTOCKS RIGHT   Final   Special Requests NONE   Final   Gram Stain     Final   Value: MODERATE WBC PRESENT,BOTH PMN AND MONONUCLEAR     NO SQUAMOUS EPITHELIAL CELLS SEEN     ABUNDANT GRAM NEGATIVE RODS     Performed at [facility]   Culture     Final   Value: MODERATE BACTEROIDES SPECIES     Note: BETA LACTAMASE POSITIVE     Performed at [facility]   Report Status 04/02/2013 FINAL   Final     Labs: Basic Metabolic Panel:  Recent Labs Lab 03/30/13 0530 03/31/13 0509 04/01/13 0527 04/02/13 0500 04/03/13 0500  NA 142 143 141 142  --   K 3.3* 4.6 3.7 3.3*  --   CL 112 115* 111 111  --   CO2 23 17* 21 23  --   GLUCOSE 91 76 76 82  --   BUN 5* 4* 3* 3*  --   CREATININE 0.93 0.91 0.81 0.80 0.75  CALCIUM 7.5* 8.0* 8.5  8.4  --    CBC:  Recent Labs Lab 03/31/13 0509 04/01/13 1300 04/02/13 0500 04/04/13 0925 04/05/13 0530  WBC 9.8 11.3* 11.6* 10.3 10.3  HGB 9.1* 8.6* 8.4* 8.9* 8.6*  HCT 27.5* 26.2* 25.6* 27.5* 27.0*  MCV 84.4 84.5 83.9 84.1 85.2  PLT 390 392 406* 423* 413*   Cardiac Enzymes: No results found for this basename: CKTOTAL, CKMB, CKMBINDEX, TROPONINI,  in the last 168 hours BNP: BNP (last 3 results) No results found for this basename: PROBNP,  in the last 8760 hours CBG:  Recent Labs Lab 04/04/13 0758 04/04/13 0954 04/04/13 1153 04/04/13 1704 04/05/13 0748  GLUCAP 68* 75 118* 128* 86    Principal Problem:   Pelvic abscess in female Active Problems:   DIABETES, TYPE 2   MULTIFOCAL ATRIAL TACHYCARDIA   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

## 2013-04-06 LAB — GLUCOSE, CAPILLARY: Glucose-Capillary: 90 mg/dL (ref 70–99)

## 2013-04-06 MED ORDER — HYDROCODONE-ACETAMINOPHEN 5-325 MG PO TABS: 1.0000 | ORAL_TABLET | ORAL | Status: DC | PRN

## 2013-04-06 MED ORDER — AMOXICILLIN-POT CLAVULANATE 875-125 MG PO TABS: 1.0000 | ORAL_TABLET | Freq: Two times a day (BID) | ORAL | Status: DC

## 2013-04-06 NOTE — Progress Notes (Addendum)
Discharged home. Home discharge instruction given, no question verbalized. Home health needs addressed by case management.

## 2013-04-06 NOTE — Progress Notes (Signed)
PT Cancellation Note  Patient Details Name: [name] MRN: [id number] DOB: [birth year]   Cancelled Treatment:    Reason Eval/Treat Not Completed: Patient at procedure or test/unavailable; IV team with pt, will attempt to follow up.   Fabio Asa 04/06/2013, 10:27 AM

## 2013-04-06 NOTE — Discharge Summary (Signed)
Doing well.  Home with assistance.

## 2013-04-08 ENCOUNTER — Telehealth: Payer: Self-pay | Admitting: Cardiovascular Disease

## 2013-04-08 ENCOUNTER — Other Ambulatory Visit: Payer: Self-pay | Admitting: *Deleted

## 2013-04-08 ENCOUNTER — Telehealth (INDEPENDENT_AMBULATORY_CARE_PROVIDER_SITE_OTHER): Payer: Self-pay

## 2013-04-08 ENCOUNTER — Encounter (INDEPENDENT_AMBULATORY_CARE_PROVIDER_SITE_OTHER): Payer: Medicare Other | Admitting: General Surgery

## 2013-04-08 ENCOUNTER — Other Ambulatory Visit: Payer: Self-pay | Admitting: Cardiovascular Disease

## 2013-04-08 ENCOUNTER — Telehealth: Payer: Self-pay | Admitting: *Deleted

## 2013-04-08 DIAGNOSIS — IMO0002 Reserved for concepts with insufficient information to code with codable children: Secondary | ICD-10-CM

## 2013-04-08 MED ORDER — LEVOFLOXACIN 500 MG PO TABS: 500.0000 mg | ORAL_TABLET | Freq: Every day | ORAL | Status: DC

## 2013-04-08 MED ORDER — METRONIDAZOLE 500 MG PO TABS: 500.0000 mg | ORAL_TABLET | Freq: Three times a day (TID) | ORAL | Status: DC

## 2013-04-08 MED ORDER — METOPROLOL TARTRATE 25 MG PO TABS: 12.5000 mg | ORAL_TABLET | Freq: Two times a day (BID) | ORAL | Status: DC

## 2013-04-08 NOTE — Telephone Encounter (Signed)
Patient sister [name] called and advised the patient has been vomiting every time she takes her Augmentin. No other symptoms or problems but she advised the patient is in a lot of pain as she has an abdominal abscess and the vomiting hurts her. Advised her will have to call the doctor and give her a call back with what to do.   Called Dr Daiva Eves and advised him of what is going on with the patient and he advised to have the patient stop taking the Augmentin. He advised me to call in Levoquin 500 mg 12 tab daily x 14 days and Flagyl 500 mg 1 tab 3x daily x 14 days. Read the order back to Dr Daiva Eves for accuracy and sent it to the pharmacy.   Called [name] back and advised her of the change in medication and to stop the Augmentin and of the appt her on Monday 04/11/13 at 4 pm. And to call the doctor on call if any further problems or questions.

## 2013-04-08 NOTE — Telephone Encounter (Signed)
Rx was sent to pharmacy electronically. 

## 2013-04-08 NOTE — Telephone Encounter (Signed)
Paged and reviewed message with our PA.  Patient taking Augmentin per [place], [place].  Patient is being followed by Infectious Disease and has a follow up appointment with Dr. Ninetta Lights on Monday 04/11/13  9:00 am.  Called Infectious Disease to review antibiotics and side effects patient is having.  Spoke to [place] and advised to contact Infectious Disease regarding medications that patient was placed on by the physicians.  Patient POA states she's called several times and has had unsuccessful attempts to speak with a physician at the office.  Lelon Mast to call Infectious Disease and speak with a Triage Nurse.  Sharyn Blitz verbalized understanding and agrees with plan as above.

## 2013-04-08 NOTE — Telephone Encounter (Signed)
POA states patient has N/V after taking Amoxicillin. Advised her stop the ABT. Needs different ABT please call to [facility] [phone]

## 2013-04-08 NOTE — Telephone Encounter (Signed)
Pt is out of her Metoprolol,needs an authorization for this.Would you please call this in today,she is completely out of her medicine.

## 2013-04-11 ENCOUNTER — Ambulatory Visit (INDEPENDENT_AMBULATORY_CARE_PROVIDER_SITE_OTHER): Payer: Medicare Other | Admitting: Infectious Diseases

## 2013-04-11 ENCOUNTER — Encounter: Payer: Self-pay | Admitting: Infectious Diseases

## 2013-04-11 VITALS — BP 113/76 | HR 93 | Temp 98.2°F | Ht 61.0 in | Wt 109.0 lb

## 2013-04-11 DIAGNOSIS — R7881 Bacteremia: Secondary | ICD-10-CM

## 2013-04-11 DIAGNOSIS — E43 Unspecified severe protein-calorie malnutrition: Secondary | ICD-10-CM

## 2013-04-11 DIAGNOSIS — N739 Female pelvic inflammatory disease, unspecified: Secondary | ICD-10-CM

## 2013-04-11 DIAGNOSIS — A4902 Methicillin resistant Staphylococcus aureus infection, unspecified site: Secondary | ICD-10-CM

## 2013-04-11 DIAGNOSIS — N731 Chronic parametritis and pelvic cellulitis: Secondary | ICD-10-CM

## 2013-04-11 DIAGNOSIS — J438 Other emphysema: Secondary | ICD-10-CM

## 2013-04-11 DIAGNOSIS — J439 Emphysema, unspecified: Secondary | ICD-10-CM

## 2013-04-11 NOTE — Assessment & Plan Note (Signed)
She appears to be doing very well. Will recheck her BCx, stop her levaquin and flagyl. Will make her appt to see her surgeon in f/u. She will return to ID prn.

## 2013-04-11 NOTE — Progress Notes (Signed)
  Subjective:    Patient ID: [name], female    DOB: 04-19-1951, 62 y.o.   MRN: [id number]  HPI 62 y.o. female with h/o crohn's disease, COPD on oxygen, s/p ileostomy and colectomy in 2007, came in 03-14-13 for worsening abdominal pain associated with nausea and vomiting. On arrival ED, she underwent CT abd revealing SBO. She underwent laparotomy with lysis of adhesions, mesh repair of ventral hernia. She had fever post op and was found to have MRSA in BCx (10-22). She underwent TEE on 10-27 (-).  She was able to be d/c home on 10-29 with plan to complete 14 days of vancomycin.  She returned to the hospital on 03-27-13 with f/c, nausea, abd discomfort. She was found to be febrile, tachycardic, hypotensive. She was started on vanco/zosyn. She underwent CT showing 1. Recent postoperative changes with a fairly large rim enhancing  fluid collection in the deep lower abdomen and pelvis most  compatible with abscess. See series 2, image 59. Smaller abscesses  in the right upper quadrant near the ostomy (images 38, and 41).  More simple appearing postoperative subcutaneous fluid along the  incision site, likely seroma. No pneumoperitoneum.   2. Mildly dilated mid small bowel loops.  She underwent IR drain of one of the fluid collections. Cx from this grew bacteroides sp (beta lactamase+). She was also found to have a 5 cm R sided abscess which was not felt to amenable to drainage. By 11-12 she was ambulating, eating and was d/c home with po augmentin. She had n/v with this and she was changed to flagyl/levaquin at home.  She completed her vanco in hospital for MRSA bacteremia.   Has been feeling tired since she was home. Still has some abd discomfort but has not needed pain rx. No f/c. Has ileostomy, working fine. No dysuria. Apetite is improving, lost wt while in hospital (13#). Had drain on her R buttock and wants to know when she can take dressing of.   Day 15 anbx for her abscess.   Review of  Systems No change in her O2 requirements.     Objective:   Physical Exam  Constitutional: She appears well-developed and well-nourished.  HENT:  Mouth/Throat: No oropharyngeal exudate.  Eyes: EOM are normal. Pupils are equal, round, and reactive to light.  Neck: Neck supple.  Cardiovascular: Normal rate, regular rhythm and normal heart sounds.   Pulmonary/Chest: Effort normal and breath sounds normal.  Abdominal: Soft. Bowel sounds are normal. She exhibits no distension. There is no tenderness.    Musculoskeletal: She exhibits no edema.  Lymphadenopathy:    She has no cervical adenopathy.          Assessment & Plan:

## 2013-04-11 NOTE — Assessment & Plan Note (Signed)
I encouraged her to supplement her diet ( chocolate milk - lactose intolerant; ensure- hates the taste; cookies.[name]).

## 2013-04-11 NOTE — Assessment & Plan Note (Signed)
States she will see Dr [name] at regularly scheduled f/u. Her O2 requirements have been stable.

## 2013-04-18 LAB — CULTURE, BLOOD (SINGLE)
Organism ID, Bacteria: NO GROWTH
Organism ID, Bacteria: NO GROWTH

## 2013-04-19 ENCOUNTER — Encounter (HOSPITAL_COMMUNITY): Payer: Self-pay | Admitting: Emergency Medicine

## 2013-04-19 ENCOUNTER — Emergency Department (HOSPITAL_COMMUNITY): Payer: Medicare Other

## 2013-04-19 ENCOUNTER — Observation Stay (HOSPITAL_COMMUNITY)
Admission: EM | Admit: 2013-04-19 | Discharge: 2013-04-20 | Disposition: A | Discharge status: Home / Self Care | Payer: Medicare Other | Attending: General Surgery | Admitting: General Surgery

## 2013-04-19 DIAGNOSIS — M069 Rheumatoid arthritis, unspecified: Secondary | ICD-10-CM | POA: Insufficient documentation

## 2013-04-19 DIAGNOSIS — N739 Female pelvic inflammatory disease, unspecified: Secondary | ICD-10-CM | POA: Diagnosis present

## 2013-04-19 DIAGNOSIS — E119 Type 2 diabetes mellitus without complications: Secondary | ICD-10-CM | POA: Insufficient documentation

## 2013-04-19 DIAGNOSIS — F329 Major depressive disorder, single episode, unspecified: Secondary | ICD-10-CM | POA: Insufficient documentation

## 2013-04-19 DIAGNOSIS — K56600 Partial intestinal obstruction, unspecified as to cause: Secondary | ICD-10-CM

## 2013-04-19 DIAGNOSIS — Z9889 Other specified postprocedural states: Secondary | ICD-10-CM | POA: Insufficient documentation

## 2013-04-19 DIAGNOSIS — K5090 Crohn's disease, unspecified, without complications: Secondary | ICD-10-CM | POA: Insufficient documentation

## 2013-04-19 DIAGNOSIS — Z87891 Personal history of nicotine dependence: Secondary | ICD-10-CM | POA: Insufficient documentation

## 2013-04-19 DIAGNOSIS — R109 Unspecified abdominal pain: Secondary | ICD-10-CM | POA: Insufficient documentation

## 2013-04-19 DIAGNOSIS — K651 Peritoneal abscess: Secondary | ICD-10-CM | POA: Insufficient documentation

## 2013-04-19 DIAGNOSIS — R110 Nausea: Principal | ICD-10-CM | POA: Insufficient documentation

## 2013-04-19 DIAGNOSIS — E860 Dehydration: Secondary | ICD-10-CM | POA: Insufficient documentation

## 2013-04-19 DIAGNOSIS — K219 Gastro-esophageal reflux disease without esophagitis: Secondary | ICD-10-CM | POA: Insufficient documentation

## 2013-04-19 DIAGNOSIS — F3289 Other specified depressive episodes: Secondary | ICD-10-CM | POA: Insufficient documentation

## 2013-04-19 DIAGNOSIS — E785 Hyperlipidemia, unspecified: Secondary | ICD-10-CM | POA: Insufficient documentation

## 2013-04-19 DIAGNOSIS — F411 Generalized anxiety disorder: Secondary | ICD-10-CM | POA: Insufficient documentation

## 2013-04-19 DIAGNOSIS — I10 Essential (primary) hypertension: Secondary | ICD-10-CM | POA: Insufficient documentation

## 2013-04-19 DIAGNOSIS — J449 Chronic obstructive pulmonary disease, unspecified: Secondary | ICD-10-CM | POA: Insufficient documentation

## 2013-04-19 DIAGNOSIS — J4489 Other specified chronic obstructive pulmonary disease: Secondary | ICD-10-CM | POA: Insufficient documentation

## 2013-04-19 DIAGNOSIS — M810 Age-related osteoporosis without current pathological fracture: Secondary | ICD-10-CM | POA: Insufficient documentation

## 2013-04-19 HISTORY — DX: Type 2 diabetes mellitus without complications: E11.9

## 2013-04-19 HISTORY — DX: Other specified chronic obstructive pulmonary disease: J44.89

## 2013-04-19 HISTORY — DX: Dependence on supplemental oxygen: Z99.81

## 2013-04-19 HISTORY — DX: Chronic obstructive pulmonary disease, unspecified: J44.9

## 2013-04-19 HISTORY — DX: Personal history of other medical treatment: Z92.89

## 2013-04-19 HISTORY — DX: Anemia, unspecified: D64.9

## 2013-04-19 HISTORY — DX: Shortness of breath: R06.02

## 2013-04-19 LAB — CBC WITH DIFFERENTIAL/PLATELET
Basophils Absolute: 0 10*3/uL (ref 0.0–0.1)
Basophils Relative: 0 % (ref 0–1)
Hemoglobin: 10.3 g/dL — ABNORMAL LOW (ref 12.0–15.0)
MCHC: 32.6 g/dL (ref 30.0–36.0)
Monocytes Relative: 9 % (ref 3–12)
Neutro Abs: 9.1 10*3/uL — ABNORMAL HIGH (ref 1.7–7.7)
Neutrophils Relative %: 74 % (ref 43–77)
Platelets: 255 10*3/uL (ref 150–400)
RDW: 13.9 % (ref 11.5–15.5)

## 2013-04-19 LAB — COMPREHENSIVE METABOLIC PANEL
ALT: 7 U/L (ref 0–35)
AST: 11 U/L (ref 0–37)
Albumin: 2.8 g/dL — ABNORMAL LOW (ref 3.5–5.2)
Alkaline Phosphatase: 82 U/L (ref 39–117)
Chloride: 105 mEq/L (ref 96–112)
Potassium: 4.1 mEq/L (ref 3.5–5.1)
Total Bilirubin: 0.2 mg/dL — ABNORMAL LOW (ref 0.3–1.2)

## 2013-04-19 LAB — URINE MICROSCOPIC-ADD ON

## 2013-04-19 LAB — URINALYSIS, ROUTINE W REFLEX MICROSCOPIC
Bilirubin Urine: NEGATIVE
Glucose, UA: NEGATIVE mg/dL
Ketones, ur: NEGATIVE mg/dL
Protein, ur: 30 mg/dL — AB
pH: 5.5 (ref 5.0–8.0)

## 2013-04-19 LAB — TROPONIN I: Troponin I: <0.3 ng/mL (ref <0.30)

## 2013-04-19 LAB — CG4 I-STAT (LACTIC ACID): Lactic Acid, Venous: 0.72 mmol/L (ref 0.5–2.2)

## 2013-04-19 MED ORDER — ACETAMINOPHEN 325 MG PO TABS
650.0000 mg | ORAL_TABLET | Freq: Four times a day (QID) | ORAL | Status: DC | PRN
  Administered 2013-04-20: 650 mg via ORAL
  Filled 2013-04-19: qty 2

## 2013-04-19 MED ORDER — METRONIDAZOLE IN NACL 5-0.79 MG/ML-% IV SOLN
500.0000 mg | Freq: Three times a day (TID) | INTRAVENOUS | Status: DC
  Administered 2013-04-19 – 2013-04-20 (×3): 500 mg via INTRAVENOUS
  Filled 2013-04-19 (×6): qty 100

## 2013-04-19 MED ORDER — ONDANSETRON HCL 4 MG/2ML IJ SOLN
4.0000 mg | Freq: Once | INTRAMUSCULAR | Status: AC
  Administered 2013-04-19: 4 mg via INTRAVENOUS
  Filled 2013-04-19: qty 2

## 2013-04-19 MED ORDER — ENOXAPARIN SODIUM 40 MG/0.4ML ~~LOC~~ SOLN
40.0000 mg | SUBCUTANEOUS | Status: DC
  Administered 2013-04-19: 40 mg via SUBCUTANEOUS
  Filled 2013-04-19 (×2): qty 0.4

## 2013-04-19 MED ORDER — HYDROCODONE-ACETAMINOPHEN 5-325 MG PO TABS
1.0000 | ORAL_TABLET | ORAL | Status: DC | PRN
  Administered 2013-04-20: 2 via ORAL
  Filled 2013-04-19 (×2): qty 2

## 2013-04-19 MED ORDER — PIPERACILLIN-TAZOBACTAM 3.375 G IVPB
3.3750 g | Freq: Three times a day (TID) | INTRAVENOUS | Status: DC
  Administered 2013-04-19 – 2013-04-20 (×2): 3.375 g via INTRAVENOUS
  Filled 2013-04-19 (×6): qty 50

## 2013-04-19 MED ORDER — SODIUM CHLORIDE 0.9 % IV SOLN
INTRAVENOUS | Status: DC
  Administered 2013-04-20: 07:00:00 via INTRAVENOUS

## 2013-04-19 MED ORDER — ACETAMINOPHEN 650 MG RE SUPP: 650.0000 mg | Freq: Four times a day (QID) | RECTAL | Status: DC | PRN

## 2013-04-19 MED ORDER — SODIUM CHLORIDE 0.9 % IV SOLN
1.0000 g | Freq: Two times a day (BID) | INTRAVENOUS | Status: DC
  Administered 2013-04-19 – 2013-04-20 (×2): 1 g via INTRAVENOUS
  Filled 2013-04-19 (×4): qty 1

## 2013-04-19 MED ORDER — IOHEXOL 350 MG/ML SOLN
100.0000 mL | Freq: Once | INTRAVENOUS | Status: AC | PRN
  Administered 2013-04-19: 100 mL via INTRAVENOUS

## 2013-04-19 MED ORDER — IOHEXOL 300 MG/ML  SOLN: 25.0000 mL | INTRAMUSCULAR | Status: AC

## 2013-04-19 MED ORDER — HYDROMORPHONE HCL PF 1 MG/ML IJ SOLN
1.0000 mg | Freq: Once | INTRAMUSCULAR | Status: AC
  Administered 2013-04-19: 1 mg via INTRAVENOUS
  Filled 2013-04-19: qty 1

## 2013-04-19 MED ORDER — ONDANSETRON HCL 4 MG/2ML IJ SOLN
4.0000 mg | Freq: Four times a day (QID) | INTRAMUSCULAR | Status: DC | PRN
  Administered 2013-04-19: 4 mg via INTRAVENOUS
  Filled 2013-04-19: qty 2

## 2013-04-19 MED ORDER — MORPHINE SULFATE 2 MG/ML IJ SOLN
2.0000 mg | INTRAMUSCULAR | Status: DC | PRN
  Administered 2013-04-19 – 2013-04-20 (×3): 2 mg via INTRAVENOUS
  Filled 2013-04-19 (×3): qty 1

## 2013-04-19 MED ORDER — MORPHINE SULFATE 4 MG/ML IJ SOLN
4.0000 mg | Freq: Once | INTRAMUSCULAR | Status: AC
  Administered 2013-04-19: 4 mg via INTRAVENOUS
  Filled 2013-04-19: qty 1

## 2013-04-19 NOTE — H&P (Signed)
Chief Complaint: abdominal pain, nausea and vomiting.    HPI: [name] is a 62 year old patient of Dr. Johna Sheriff with a history of COPD, diabetes mellitus, anemia, s/p exploratory laparotomy on 03/16/13, previous ileostomy.  She was hospitalized 03/27/13-04/06/13 due to a pelvic abscess which required IR drain placement.  She was doing fairly well until 2 days ago at which time she developed pain behind her ileostomy.  Moderate in severity.  Coarse is worsening.  She developed nausea and vomiting last night and today.  She reports adequate output from her ostomy including today.  She denies fever, chills or sweats.  She reports that her appetite has been poor every since her surgery although her weight has remained stable around 110lbs.  Her white count today is 12.2K.  At CT of abdomen showed a new small fluid collection in the pelvis measuring at 1.7cm.  The abscess previously seen to right upper anterior abdomen has resolved.  The CT also showed mildly dilated loops with a questionable stricture at anastomosis site.  She reports early satiety since her last surgery.  It does not appear that she had a bowel resection at that time, she had lysis of adhesions and a hernia repair.  She is tachycardic in the 130s.  She is afebrile and her blood pressure is stable.    Past Medical History  Diagnosis Date  . Other diseases of vocal cords   . Osteoporosis, unspecified   . Other and unspecified hyperlipidemia   . Esophageal reflux   . Chronic airway obstruction, not elsewhere classified   . Regional enteritis of unspecified site   . 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 Type II or unspecified type diabetes mellitus without mention of complication, not stated as uncontrolled   . Crohn's disease     Ileostomy  . Pelvic adhesions   . Anxiety   . Depression   . Hypertension   . Shortness of breath   . Pneumonia   . Asthma   . Emphysema   . Hematuria   . Rheumatoid  arthritis(714.0) 03/2012    [place]  . Severe cervical dysplasia age 25    cone biopsy with neg paps since    Past Surgical History  Procedure Laterality Date  . Ileostomy    . Vaginal hysterectomy  1993    LAVH-LSO  . Ear cyst excision    . Cervical cone biopsy  1978  . Colon surgery  2007    subtotal colectomy with ileostomy  . Esophagus stretch    . Oophorectomy      LSO  . Tracheostomy  2010  . Hammer toe surgery  1980's    Bilateral Feet  . Balloon dilation  02/10/2012    Procedure: BALLOON DILATION;  Surgeon: Charolett Bumpers, MD;  Location: [facility] ENDOSCOPY;  Service: Endoscopy;  Laterality: N/A;  . Colposcopy    . Flexible sigmoidoscopy  03/26/2012    Procedure: FLEXIBLE SIGMOIDOSCOPY;  Surgeon: Charolett Bumpers, MD;  Location: [facility] ENDOSCOPY;  Service: Endoscopy;  Laterality: N/A;  . Cardiac catheterization  07/08/2007    normal  . Nm myocar perf wall motion  09/21/2006    no ischemia  . Ventral hernia repair N/A 03/16/2013    Procedure: REPAIR OF INCISIONAL AND PARASTOMAL HERNIAS;  Surgeon: Mariella Saa, MD;  Location: [facility] OR;  Service: General;  Laterality: N/A;  . Laparotomy N/A 03/16/2013    Procedure: EXPLORATORY LAPAROTOMY;  Surgeon: Mariella Saa, MD;  [facility];  Service: General;  Laterality: N/A;  . Lysis of adhesion N/A 03/16/2013    Procedure: LYSIS OF ADHESIONS FOR SMALL BOWEL OBSTRUCTION;  Surgeon: Mariella Saa, MD;  Location: [facility] OR;  Service: General;  Laterality: N/A;  . Insertion of mesh N/A 03/16/2013    Procedure: INSERTION OF BIOLOGIC MESH;  Surgeon: Mariella Saa, MD;  Location: [facility] OR;  Service: General;  Laterality: N/A;  . Tee without cardioversion N/A 03/21/2013    Procedure: TRANSESOPHAGEAL ECHOCARDIOGRAM (TEE);  Surgeon: Vesta Mixer, MD;  Location[facility] ENDOSCOPY;  Service: Cardiovascular;  Laterality: N/A;    Family History  Problem Relation Age of Onset  . Osteoporosis Mother   . Kidney disease Mother   .  Hypertension Sister   . Stroke Sister   . Diabetes Sister    Social History:  reports that she quit smoking about 9 years ago. She has never used smokeless tobacco. She reports that she drinks alcohol. She reports that she does not use illicit drugs.  Allergies:  Allergies  Allergen Reactions  . Esomeprazole Magnesium Other (See Comments)    NEXIUM - reaction > aggravated pt's Crohn's disease  . Other     Beans, Dander/Dust, Peas, Mushrooms  . Peanut-Containing Drug Products Other (See Comments)    unknown  . Shellfish Allergy Other (See Comments)    unknown  . Surgical Lubricant Other (See Comments)    Burns skin      (Not in a hospital admission)  Results for orders placed during the hospital encounter of 04/19/13 (from the past 48 hour(s))  CBC WITH DIFFERENTIAL     Status: Abnormal   Collection Time    04/19/13  9:00 AM      Result Value Range   WBC 12.2 (*) 4.0 - 10.5 K/uL   RBC 3.68 (*) 3.87 - 5.11 MIL/uL   Hemoglobin 10.3 (*) 12.0 - 15.0 g/dL   HCT 08.6 (*) 57.8 - 46.9 %   MCV 85.9  78.0 - 100.0 fL   MCH 28.0  26.0 - 34.0 pg   MCHC 32.6  30.0 - 36.0 g/dL   RDW 62.9  52.8 - 41.3 %   Platelets 255  150 - 400 K/uL   Neutrophils Relative % 74  43 - 77 %   Neutro Abs 9.1 (*) 1.7 - 7.7 K/uL   Lymphocytes Relative 12  12 - 46 %   Lymphs Abs 1.5  0.7 - 4.0 K/uL   Monocytes Relative 9  3 - 12 %   Monocytes Absolute 1.1 (*) 0.1 - 1.0 K/uL   Eosinophils Relative 4  0 - 5 %   Eosinophils Absolute 0.5  0.0 - 0.7 K/uL   Basophils Relative 0  0 - 1 %   Basophils Absolute 0.0  0.0 - 0.1 K/uL  COMPREHENSIVE METABOLIC PANEL     Status: Abnormal   Collection Time    04/19/13  9:00 AM      Result Value Range   Sodium 138  135 - 145 mEq/L   Potassium 4.1  3.5 - 5.1 mEq/L   Chloride 105  96 - 112 mEq/L   CO2 22  19 - 32 mEq/L   Glucose, Bld 82  70 - 99 mg/dL   BUN 15  6 - 23 mg/dL   Creatinine, Ser 2.44  0.50 - 1.10 mg/dL   Calcium 9.9  8.4 - 01.0 mg/dL   Total Protein  7.6  6.0 - 8.3 g/dL   Albumin  2.8 (*) 3.5 - 5.2 g/dL   AST 11  0 - 37 U/L   ALT 7  0 - 35 U/L   Alkaline Phosphatase 82  39 - 117 U/L   Total Bilirubin 0.2 (*) 0.3 - 1.2 mg/dL   GFR calc non Af Amer 62 (*) >90 mL/min   GFR calc Af Amer 72 (*) >90 mL/min   Comment: (NOTE)     The eGFR has been calculated using the CKD EPI equation.     This calculation has not been validated in all clinical situations.     eGFR's persistently <90 mL/min signify possible Chronic Kidney     Disease.  LIPASE, BLOOD     Status: None   Collection Time    04/19/13  9:00 AM      Result Value Range   Lipase 30  11 - 59 U/L  TROPONIN I     Status: None   Collection Time    04/19/13  9:00 AM      Result Value Range   Troponin I <0.30  <0.30 ng/mL   Comment:            Due to the release kinetics of cTnI,     a negative result within the first hours     of the onset of symptoms does not rule out     myocardial infarction with certainty.     If myocardial infarction is still suspected,     repeat the test at appropriate intervals.  CG4 I-STAT (LACTIC ACID)     Status: None   Collection Time    04/19/13  9:14 AM      Result Value Range   Lactic Acid, Venous 0.72  0.5 - 2.2 mmol/L  URINALYSIS, ROUTINE W REFLEX MICROSCOPIC     Status: Abnormal   Collection Time    04/19/13 11:10 AM      Result Value Range   Color, Urine YELLOW  YELLOW   APPearance CLOUDY (*) CLEAR   Specific Gravity, Urine 1.019  1.005 - 1.030   pH 5.5  5.0 - 8.0   Glucose, UA NEGATIVE  NEGATIVE mg/dL   Hgb urine dipstick SMALL (*) NEGATIVE   Bilirubin Urine NEGATIVE  NEGATIVE   Ketones, ur NEGATIVE  NEGATIVE mg/dL   Protein, ur 30 (*) NEGATIVE mg/dL   Urobilinogen, UA 0.2  0.0 - 1.0 mg/dL   Nitrite NEGATIVE  NEGATIVE   Leukocytes, UA LARGE (*) NEGATIVE  URINE MICROSCOPIC-ADD ON     Status: None   Collection Time    04/19/13 11:10 AM      Result Value Range   Squamous Epithelial / LPF RARE  RARE   WBC, UA 7-10  <3 WBC/hpf    RBC / HPF 0-2  <3 RBC/hpf   Bacteria, UA RARE  RARE   Ct Abdomen Pelvis W Contrast  04/19/2013   CLINICAL DATA:  Chest pain, shortness of breath and abdominal pain.  EXAM: CT ANGIOGRAPHY CHEST  CT ABDOMEN AND PELVIS WITH CONTRAST  TECHNIQUE: Multidetector CT imaging of the chest was performed using the standard protocol during bolus administration of intravenous contrast. Multiplanar CT image reconstructions including MIPs were obtained to evaluate the vascular anatomy. Multidetector CT imaging of the abdomen and pelvis was performed using the standard protocol during bolus administration of intravenous contrast.  CONTRAST:  100 mL OMNIPAQUE IOHEXOL 350 MG/ML SOLN  COMPARISON:  CT abdomen and pelvis 04/03/2013, 03/27/2013 and 03/14/2013. CT chest 12/01/2011.  FINDINGS: CTA  CHEST FINDINGS  No pulmonary embolus is identified. Heart size is normal. No pleural or pericardial effusion. No axillary, hilar or mediastinal lymphadenopathy. The lungs demonstrate extensive emphysematous change. There is some scarring in the right upper lobe. The lungs are otherwise clear.  CT ABDOMEN and PELVIS FINDINGS  Again seen and is postoperative change of colectomy with a Hartmann's pouch and right lower quadrant ileostomy. Pigtail catheter in the pelvis seen on the prior examination has been removed. There is a small fluid collection at the former site of the catheter measuring 1.7 cm transverse by 4.1 cm AP by 1.6 cm craniocaudal. Very small subcutaneous fluid collection to the right of midline seen on the most recent examination has resolved.  There is a single mildly dilated loop of small bowel in the right hemipelvis with fecalized contents. This loop measures 2.8 cm in diameter and may be dilated secondary to focal ileus or partial obstruction possibly related to adhesions or surgical anastomosis in the right hemipelvis. Distal small bowel loops are completely decompressed.  The liver, gallbladder, spleen and adrenal glands  are unremarkable. Small renal cysts are noted. There is no lymphadenopathy. 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  Review of the MIP images confirms the above findings.  IMPRESSION: Status post removal of pigtail catheter from the pelvis. There is a small, new fluid collection in the pelvis measuring 1.7 cm transverse by 4.1 cm AP x 1.6 cm craniocaudal which may represent recurrent abscess.  Mildly dilated loop of small bowel in the pelvis could be due to focal ileus or partial obstruction secondary to adhesions or stricture at a surgical anastomosis.  Very small subcutaneous fluid collection to the right of midline seen on the most recent examination has resolved.  Negative for pulmonary embolus or acute cardiopulmonary disease.  Emphysema.  Avascular necrosis of the femoral heads, worse on the left.   Electronically Signed   By: Drusilla Kanner M.D.   On: 04/19/2013 15:08   Dg Abd Acute W/chest  04/19/2013   CLINICAL DATA:  Abdominal pain  EXAM: ACUTE ABDOMEN SERIES (ABDOMEN 2 VIEW & CHEST 1 VIEW)  COMPARISON:  04/01/2013  FINDINGS: Cardiomediastinal silhouette is stable. Hyperinflation. There is linear scarring in right midlung. No acute infiltrate or pulmonary edema.  There is nonspecific nonobstructive bowel gas pattern. A colostomy is noted in right lower quadrant. No free abdominal air.  IMPRESSION: Negative abdominal radiographs.  No acute cardiopulmonary disease.   Electronically Signed   By: Natasha Mead M.D.   On: 04/19/2013 09:42    Review of Systems  Constitutional: Positive for weight loss and malaise/fatigue. Negative for fever, chills and diaphoresis.  Respiratory: Negative for shortness of breath and wheezing.   Cardiovascular: Positive for chest pain. Negative for palpitations, orthopnea, claudication and leg swelling.  Gastrointestinal: Positive for nausea, vomiting and abdominal pain. Negative for blood in stool.  Genitourinary:  Negative for dysuria and hematuria.  Neurological: Negative for sensory change, speech change, seizures and weakness.    Blood pressure 130/75, pulse 144, temperature 97.5 F (36.4 C), temperature source Oral, resp. rate 17, SpO2 100.00%. Physical Exam  Constitutional: She is oriented to person, place, and time. She appears well-developed and well-nourished. No distress.  Neck: Neck supple.  Cardiovascular: Normal rate, regular rhythm and intact distal pulses.  Exam reveals no gallop and no friction rub.   No murmur heard. Tachycardic   Respiratory: Effort normal and breath sounds normal. No respiratory distress. She has  no wheezes. She has no rales. She exhibits no tenderness.  GI: Soft. Bowel sounds are normal. She exhibits no distension and no mass.  TTP pelvic region and epigastric region.  Midline incision healing well.  Rt side abdomen ileostomy with semi solid stool.  Musculoskeletal: She exhibits no edema.  Neurological: She is alert and oriented to person, place, and time.  Skin: Skin is dry. No rash noted. She is not diaphoretic. No erythema. No pallor.  Psychiatric: She has a normal mood and affect. Her behavior is normal. Judgment and thought content normal.     Assessment/Plan S/p exploratory laparotomy LOA and hiatal hernia repair 03/16/13 Pelvic abscess Leukocytosis Anemia Dehydration   Admit for IV hydration and antibiotics.  IR would likely not be able to drain the abscess given location and size. Continue with diet as she does not appear obstructed Nutrition consult for weight loss Monitor ileostomy output due to dilated bowel loops and pain behind the ostomy. Repeat labs in AM    Carson Bogden, [facility] ANP-BC 04/19/2013, 4:36 PM

## 2013-04-19 NOTE — ED Notes (Signed)
Family at bedside. 

## 2013-04-19 NOTE — Progress Notes (Signed)
ANTIBIOTIC CONSULT NOTE - INITIAL  Pharmacy Consult for Merrem Indication: pelvic abscess   Allergies  Allergen Reactions  . Esomeprazole Magnesium Other (See Comments)    NEXIUM - reaction > aggravated pt's Crohn's disease  . Other     Beans, Dander/Dust, Peas, Mushrooms  . Peanut-Containing Drug Products Other (See Comments)    unknown  . Shellfish Allergy Other (See Comments)    unknown  . Surgical Lubricant Other (See Comments)    Burns skin    Vital Signs: Temp: 97.5 F (36.4 C) (11/25 0815) Temp src: Oral (11/25 0815) BP: 111/60 mmHg (11/25 1513) Pulse Rate: 116 (11/25 1513) Intake/Output from previous day:   Intake/Output from this shift:    Labs:  Recent Labs  04/19/13 0900  WBC 12.2*  HGB 10.3*  PLT 255  CREATININE 0.95   The CrCl is unknown because both a height and weight (above a minimum accepted value) are required for this calculation. No results found for this basename: VANCOTROUGH, VANCOPEAK, VANCORANDOM, GENTTROUGH, GENTPEAK, GENTRANDOM, TOBRATROUGH, TOBRAPEAK, TOBRARND, AMIKACINPEAK, AMIKACINTROU, AMIKACIN,  in the last 72 hours   Microbiology: Recent Results (from the past 720 hour(s))  URINE CULTURE     Status: None   Collection Time    03/21/13 10:11 AM      Result Value Range Status   Specimen Description URINE, RANDOM   Final   Special Requests NONE   Final   Culture  Setup Time     Final   Value: 03/21/2013 11:01     Performed at [facility] Count     Final   Value: NO GROWTH     Performed at [facility]   Culture     Final   Value: NO GROWTH     Performed at [facility]   Report Status 03/22/2013 FINAL   Final  URINE CULTURE     Status: None   Collection Time    03/27/13  1:53 PM      Result Value Range Status   Specimen Description URINE, CLEAN CATCH   Final   Special Requests NONE   Final   Culture  Setup Time     Final   Value: 03/27/2013 21:50     Performed at [facility] Count     Final   Value: 15,000 COLONIES/ML     Performed at [facility]   Culture     Final   Value: YEAST     Performed at [facility]   Report Status 03/28/2013 FINAL   Final  CULTURE, BLOOD (ROUTINE X 2)     Status: None   Collection Time    03/27/13  1:55 PM      Result Value Range Status   Specimen Description BLOOD PICC LINE   Final   Special Requests BOTTLES DRAWN AEROBIC AND ANAEROBIC 10CC   Final   Culture  Setup Time     Final   Value: 03/27/2013 21:48     Performed at [facility]   Culture     Final   Value: NO GROWTH 5 DAYS     Performed at [facility]   Report Status 04/02/2013 FINAL   Final  CULTURE, BLOOD (ROUTINE X 2)     Status: None   Collection Time    03/27/13  3:25 PM      Result Value Range Status   Specimen Description BLOOD RIGHT HAND  Final   Special Requests     Final   Value: BOTTLES DRAWN AEROBIC AND ANAEROBIC 10CC BLUE,5CC RED   Culture  Setup Time     Final   Value: 03/27/2013 21:48     Performed at [facility]   Culture     Final   Value: NO GROWTH 5 DAYS     Performed at [facility]   Report Status 04/02/2013 FINAL   Final  MRSA PCR SCREENING     Status: None   Collection Time    03/27/13  7:17 PM      Result Value Range Status   MRSA by PCR NEGATIVE  NEGATIVE Final   Comment:            The GeneXpert MRSA Assay (FDA     approved for NASAL specimens     only), is one component of a     comprehensive MRSA colonization     surveillance program. It is not     intended to diagnose MRSA     infection nor to guide or     monitor treatment for     MRSA infections.  CULTURE, ROUTINE-ABSCESS     Status: None   Collection Time    03/28/13  3:27 PM      Result Value Range Status   Specimen Description ABSCESS BUTTOCKS RIGHT   Final   Special Requests NONE   Final   Gram Stain     Final   Value: MODERATE WBC PRESENT,BOTH PMN AND MONONUCLEAR     NO SQUAMOUS EPITHELIAL CELLS SEEN      ABUNDANT GRAM NEGATIVE RODS     Performed at [facility]   Culture     Final   Value: NO GROWTH 3 DAYS     Performed at [facility]   Report Status 04/01/2013 FINAL   Final  ANAEROBIC CULTURE     Status: None   Collection Time    03/28/13  3:27 PM      Result Value Range Status   Specimen Description ABSCESS BUTTOCKS RIGHT   Final   Special Requests NONE   Final   Gram Stain     Final   Value: MODERATE WBC PRESENT,BOTH PMN AND MONONUCLEAR     NO SQUAMOUS EPITHELIAL CELLS SEEN     ABUNDANT GRAM NEGATIVE RODS     Performed at [facility]   Culture     Final   Value: MODERATE BACTEROIDES SPECIES     Note: BETA LACTAMASE POSITIVE     Performed at [facility]   Report Status 04/02/2013 FINAL   Final  CULTURE, BLOOD (SINGLE)     Status: None   Collection Time    04/11/13  5:47 PM      Result Value Range Status   Organism ID, Bacteria NO GROWTH 5 DAYS   Final  CULTURE, BLOOD (SINGLE)     Status: None   Collection Time    04/11/13  5:47 PM      Result Value Range Status   Organism ID, Bacteria NO GROWTH 5 DAYS   Final    Medical History: Past Medical History  Diagnosis Date  . Other diseases of vocal cords   . Osteoporosis, unspecified   . Other and unspecified hyperlipidemia   . Esophageal reflux   . Chronic airway obstruction, not elsewhere classified   . Regional enteritis of unspecified site   . Obstructive chronic bronchitis without  exacerbation   . Type II or unspecified type diabetes mellitus without mention of complication, not stated as uncontrolled   . Crohn's disease     Ileostomy  . Pelvic adhesions   . Anxiety   . Depression   . Hypertension   . Shortness of breath   . Pneumonia   . Asthma   . Emphysema   . Hematuria   . Rheumatoid arthritis(714.0) 03/2012    [facility]  . Severe cervical dysplasia age 62    cone biopsy with neg paps since    Medications:  See med rec  Assessment: Patient is a 62 y.o F with  hx pelvic abscess s/p drain placement on 03/28/13 and removed on 04/08/13. Patient was treated with vancomycin and zosyn during that hospital admission and was discharged on PO augmentin.  She presented to the ED today with c/o abdominal pain.   CT abdomen pelvis reveals "small , new fluid collection in the pelvis...which may represent recurrent abscesses."  To start Merrem for broad coverage.  Plan:  1) Merrem 1gm IV q12  Lucia Gaskins 04/19/2013,4:07 PM

## 2013-04-19 NOTE — ED Notes (Signed)
Patient states started having abdominal pain behind the R ileostomy.   Patient states she started getting nauseated this morning.   Only vomited once this morning.

## 2013-04-19 NOTE — ED Notes (Signed)
Dr Wyatt at bedside.  

## 2013-04-19 NOTE — ED Notes (Signed)
Pt being transported to x-ray at this time.  

## 2013-04-19 NOTE — ED Provider Notes (Signed)
CSN: [id number]     Arrival date & time 04/19/13  0813 History   First MD Initiated Contact with Patient 04/19/13 [phone]     Chief Complaint  Patient presents with  . Abdominal Pain   (Consider location/radiation/quality/duration/timing/severity/associated sxs/prior Treatment) HPI Comments: Patient presents with abdominal pain on the right side of her abdomen has been ongoing and worsening for the past 3 days. Today she had 2 episodes of vomiting. Denies any fevers, chills. No change in ileostomy output history is complicated in that she had a bowel obstruction last month as well as pelvic abscess that was drained by IR. She is no longer on antibiotics. Her PICC line has been discontinued. She reports this pain is similar to what she had last time. She denies any worsening of her chronic shortness of breath on oxygen. She endorses fleeting episodes of right-sided chest pain lasting a few seconds at a time it does not radiate. Denies any leg pain or leg swelling.  The history is provided by the patient.    Past Medical History  Diagnosis Date  . Other diseases of vocal cords   . Osteoporosis, unspecified   . Other and unspecified hyperlipidemia   . Esophageal reflux   . Chronic airway obstruction, not elsewhere classified   . Regional enteritis of unspecified site   . 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 Type II or unspecified type diabetes mellitus without mention of complication, not stated as uncontrolled   . Crohn's disease     Ileostomy  . Pelvic adhesions   . Anxiety   . Depression   . Hypertension   . Shortness of breath   . Pneumonia   . Asthma   . Emphysema   . Hematuria   . Rheumatoid arthritis(714.0) 03/2012    [facility]ckman  . Severe cervical dysplasia age 62    cone biopsy with neg paps since   Past Surgical History  Procedure Laterality Date  . Ileostomy    . Vaginal hysterectomy  1993    [facility]  . Ear cyst excision    . Cervical cone biopsy   1978  . Colon surgery  2007    subtotal colectomy with ileostomy  . Esophagus stretch    . Oophorectomy      LSO  . Tracheostomy  2010  . Hammer toe surgery  1980's    Bilateral Feet  . Balloon dilation  02/10/2012    Procedure: BALLOON DILATION;  Surgeon: Charolett Bumpers, MD;  Location: [facility]COPY;  Service: Endoscopy;  Laterality: N/A;  . Colposcopy    . Flexible sigmoidoscopy  03/26/2012    Procedure: FLEXIBLE SIGMOIDOSCOPY;  Surgeon: Charolett Bumpers, MD;  Location: [facility];  Service: Endoscopy;  Laterality: N/A;  . Cardiac catheterization  07/08/2007    normal  . Nm myocar perf wall motion  09/21/2006    no ischemia  . Ventral hernia repair N/A 03/16/2013    Procedure: REPAIR OF INCISIONAL AND PARASTOMAL HERNIAS;  Surgeon: Mariella Saa, MD;  Location: [facility] OR;  Service: General;  Laterality: N/A;  . Laparotomy N/A 03/16/2013    Procedure: EXPLORATORY LAPAROTOMY;  Surgeon: Mariella Saa, MD;  Location: [facility];  Service: General;  Laterality: N/A;  . Lysis of adhesion N/A 03/16/2013    Procedure: LYSIS OF ADHESIONS FOR SMALL BOWEL OBSTRUCTION;  Surgeon: Mariella Saa, MD;  Location: [facility] OR;  Service: General;  Laterality: N/A;  . Insertion of mesh N/A 03/16/2013    Procedure:  INSERTION OF BIOLOGIC MESH;  Surgeon: Mariella Saa, MD;  Location: [facility] OR;  Service: General;  Laterality: N/A;  . Tee without cardioversion N/A 03/21/2013    Procedure: TRANSESOPHAGEAL ECHOCARDIOGRAM (TEE);  Surgeon: Vesta Mixer, MD;  Location: [facility] ENDOSCOPY;  Service: Cardiovascular;  Laterality: N/A;   Family History  Problem Relation Age of Onset  . Osteoporosis Mother   . Kidney disease Mother   . Hypertension Sister   . Stroke Sister   . Diabetes Sister    History  Substance Use Topics  . Smoking status: Former Smoker -- 1.00 packs/day for 32 years    Quit date: 05/27/2003  . Smokeless tobacco: Never Used  . Alcohol Use: 0.0 oz/week     Comment: occasional   OB  History   Grav Para Term Preterm Abortions TAB SAB Ect Mult Living   3 0   3     0     Review of Systems  Constitutional: Positive for activity change, appetite change and fatigue. Negative for fever.  HENT: Negative for dental problem and rhinorrhea.   Eyes: Negative for visual disturbance.  Respiratory: Positive for chest tightness.   Cardiovascular: Positive for chest pain.  Gastrointestinal: Positive for nausea, vomiting and abdominal pain. Negative for diarrhea and constipation.  Genitourinary: Negative for dysuria, hematuria, vaginal bleeding and vaginal discharge.  Musculoskeletal: Negative for back pain.  Skin: Negative for rash.  Neurological: Positive for weakness. Negative for dizziness.  A complete 10 system review of systems was obtained and all systems are negative except as noted in the HPI and PMH.    Allergies  Esomeprazole magnesium; Other; Peanut-containing drug products; Shellfish allergy; and Surgical lubricant  Home Medications   No current outpatient prescriptions on file. BP 118/67  Pulse 119  Temp(Src) 98 F (36.7 C) (Oral)  Resp 16  SpO2 95% Physical Exam  Constitutional: She is oriented to person, place, and time. She appears well-developed and well-nourished. No distress.  HENT:  Head: Normocephalic and atraumatic.  Mouth/Throat: Oropharynx is clear and moist. No oropharyngeal exudate.  Eyes: Conjunctivae and EOM are normal. Pupils are equal, round, and reactive to light.  Neck: Normal range of motion. Neck supple.  Cardiovascular: Normal rate, regular rhythm and normal heart sounds.   No murmur heard. tachycardic  Pulmonary/Chest: Effort normal and breath sounds normal. No respiratory distress.  Abdominal: Soft. There is tenderness. There is guarding. There is no rebound.  Well healing surgical incisions.  Diffuse tenderness to palpation with guarding. RLQ ileostomy Ostomy is pink with light brown stool  Musculoskeletal: Normal range of  motion. She exhibits no edema and no tenderness.  Neurological: She is alert and oriented to person, place, and time. No cranial nerve deficit. Coordination normal.  Skin: Skin is warm.    ED Course  Procedures (including critical care time) Labs Review Labs Reviewed  CBC WITH DIFFERENTIAL - Abnormal; Notable for the following:    WBC 12.2 (*)    RBC 3.68 (*)    Hemoglobin 10.3 (*)    HCT 31.6 (*)    Neutro Abs 9.1 (*)    Monocytes Absolute 1.1 (*)    All other components within normal limits  COMPREHENSIVE METABOLIC PANEL - Abnormal; Notable for the following:    Albumin 2.8 (*)    Total Bilirubin 0.2 (*)    GFR calc non Af Amer 62 (*)    GFR calc Af Amer 72 (*)    All other components within normal limits  URINALYSIS,  ROUTINE W REFLEX MICROSCOPIC - Abnormal; Notable for the following:    APPearance CLOUDY (*)    Hgb urine dipstick SMALL (*)    Protein, ur 30 (*)    Leukocytes, UA LARGE (*)    All other components within normal limits  CULTURE, BLOOD (ROUTINE X 2)  CULTURE, BLOOD (ROUTINE X 2)  LIPASE, BLOOD  TROPONIN I  URINE MICROSCOPIC-ADD ON  COMPREHENSIVE METABOLIC PANEL  CBC  CG4 I-STAT (LACTIC ACID)   Imaging Review Ct Abdomen Pelvis W Contrast  04/19/2013   CLINICAL DATA:  Chest pain, shortness of breath and abdominal pain.  EXAM: CT ANGIOGRAPHY CHEST  CT ABDOMEN AND PELVIS WITH CONTRAST  TECHNIQUE: Multidetector CT imaging of the chest was performed using the standard protocol during bolus administration of intravenous contrast. Multiplanar CT image reconstructions including MIPs were obtained to evaluate the vascular anatomy. Multidetector CT imaging of the abdomen and pelvis was performed using the standard protocol during bolus administration of intravenous contrast.  CONTRAST:  100 mL OMNIPAQUE IOHEXOL 350 MG/ML SOLN  COMPARISON:  CT abdomen and pelvis 04/03/2013, 03/27/2013 and 03/14/2013. CT chest 12/01/2011.  FINDINGS: CTA CHEST FINDINGS  No pulmonary embolus  is identified. Heart size is normal. No pleural or pericardial effusion. No axillary, hilar or mediastinal lymphadenopathy. The lungs demonstrate extensive emphysematous change. There is some scarring in the right upper lobe. The lungs are otherwise clear.  CT ABDOMEN and PELVIS FINDINGS  Again seen and is postoperative change of colectomy with a Hartmann's pouch and right lower quadrant ileostomy. Pigtail catheter in the pelvis seen on the prior examination has been removed. There is a small fluid collection at the former site of the catheter measuring 1.7 cm transverse by 4.1 cm AP by 1.6 cm craniocaudal. Very small subcutaneous fluid collection to the right of midline seen on the most recent examination has resolved.  There is a single mildly dilated loop of small bowel in the right hemipelvis with fecalized contents. This loop measures 2.8 cm in diameter and may be dilated secondary to focal ileus or partial obstruction possibly related to adhesions or surgical anastomosis in the right hemipelvis. Distal small bowel loops are completely decompressed.  The liver, gallbladder, spleen and adrenal glands are unremarkable. Small renal cysts are noted. There is no lymphadenopathy. 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  Review of the MIP images confirms the above findings.  IMPRESSION: Status post removal of pigtail catheter from the pelvis. There is a small, new fluid collection in the pelvis measuring 1.7 cm transverse by 4.1 cm AP x 1.6 cm craniocaudal which may represent recurrent abscess.  Mildly dilated loop of small bowel in the pelvis could be due to focal ileus or partial obstruction secondary to adhesions or stricture at a surgical anastomosis.  Very small subcutaneous fluid collection to the right of midline seen on the most recent examination has resolved.  Negative for pulmonary embolus or acute cardiopulmonary disease.  Emphysema.  Avascular necrosis of  the femoral heads, worse on the left.   Electronically Signed   By: Drusilla Kanner M.D.   On: 04/19/2013 15:08   Dg Abd Acute W/chest  04/19/2013   CLINICAL DATA:  Abdominal pain  EXAM: ACUTE ABDOMEN SERIES (ABDOMEN 2 VIEW & CHEST 1 VIEW)  COMPARISON:  04/01/2013  FINDINGS: Cardiomediastinal silhouette is stable. Hyperinflation. There is linear scarring in right midlung. No acute infiltrate or pulmonary edema.  There is nonspecific nonobstructive bowel gas pattern. A  colostomy is noted in right lower quadrant. No free abdominal air.  IMPRESSION: Negative abdominal radiographs.  No acute cardiopulmonary disease.   Electronically Signed   By: Natasha Mead M.D.   On: 04/19/2013 09:42    EKG Interpretation    Date/Time:  Tuesday April 19 2013 08:40:00 EST Ventricular Rate:  101 PR Interval:    QRS Duration: 71 QT Interval:  345 QTC Calculation: 447 R Axis:   63 Text Interpretation:  normal sinus Artifact Artifact in lead(s) I II aVR aVL Confirmed by Manus Gunning  MD, Veneda Kirksey ([id number]) on 04/19/2013 8:56:03 AM            MDM   1. Pelvic abscess in female   2. Partial small bowel obstruction    Abdominal pain with nausea and vomiting. History of recent small bowel obstruction and pelvic abscess. Mild tachycardia, vital stable  Patient given IV fluids, antiemetics, pain meds. EKG with sinus tachy. Chest pain is atypical for ACS and PE. Lactate normal. WBC 12. Cr normal.  CT delayed as patient was not drinking contrast and there was a miscommunication with the tech. AAS shows no abnormality. CTPE negative.  CT shows new pelvic fluid collection concerning for abscess and possible partial obstruction. IV antibiotics started.  Surgery consulted to evaluate.  Glynn Octave, MD 04/19/13 [phone]

## 2013-04-19 NOTE — ED Notes (Signed)
Patient transported to CT 

## 2013-04-20 LAB — COMPREHENSIVE METABOLIC PANEL
AST: 10 U/L (ref 0–37)
Alkaline Phosphatase: 73 U/L (ref 39–117)
BUN: 13 mg/dL (ref 6–23)
CO2: 21 mEq/L (ref 19–32)
Chloride: 106 mEq/L (ref 96–112)
Creatinine, Ser: 1.04 mg/dL (ref 0.50–1.10)
GFR calc Af Amer: 65 mL/min — ABNORMAL LOW (ref >90)
GFR calc non Af Amer: 56 mL/min — ABNORMAL LOW (ref >90)
Glucose, Bld: 70 mg/dL (ref 70–99)
Potassium: 3.9 mEq/L (ref 3.5–5.1)
Sodium: 138 mEq/L (ref 135–145)
Total Bilirubin: 0.3 mg/dL (ref 0.3–1.2)

## 2013-04-20 LAB — CBC
Hemoglobin: 8.9 g/dL — ABNORMAL LOW (ref 12.0–15.0)
Platelets: 259 10*3/uL (ref 150–400)
RBC: 3.23 MIL/uL — ABNORMAL LOW (ref 3.87–5.11)
RDW: 14.2 % (ref 11.5–15.5)
WBC: 10.5 10*3/uL (ref 4.0–10.5)

## 2013-04-20 MED ORDER — AMOXICILLIN-POT CLAVULANATE 875-125 MG PO TABS: 1.0000 | ORAL_TABLET | Freq: Two times a day (BID) | ORAL | Status: DC

## 2013-04-20 MED ORDER — LORAZEPAM 0.5 MG PO TABS
0.5000 mg | ORAL_TABLET | Freq: Once | ORAL | Status: AC
  Administered 2013-04-20: 0.5 mg via ORAL
  Filled 2013-04-20: qty 1

## 2013-04-20 MED ORDER — HYDROCODONE-ACETAMINOPHEN 5-325 MG PO TABS: 1.0000 | ORAL_TABLET | ORAL | Status: DC | PRN

## 2013-04-20 NOTE — Progress Notes (Signed)
Pt discharged home in stable condition.  Pt understood discharge instructions and was given her prescriptions.  Was transferred to personal vehicle of family member via wheelchair.

## 2013-04-20 NOTE — Discharge Summary (Signed)
Patient ID: [name] MRN: [id number] DOB/AGE: [birth year] 63 y.o.  Admit date: 04/19/2013 Discharge date: 04/20/2013  Procedures: none  Consults: None  Reason for Admission: [name] is a 62 year old patient of Dr. Johna Sheriff with a history of COPD, diabetes mellitus, anemia, s/p exploratory laparotomy on 03/16/13, previous ileostomy. She was hospitalized 03/27/13-04/06/13 due to a pelvic abscess which required IR drain placement. She was doing fairly well until 2 days ago at which time she developed pain behind her ileostomy. Moderate in severity. Coarse is worsening. She developed nausea and vomiting last night and today. She reports adequate output from her ostomy including today. She denies fever, chills or sweats. She reports that her appetite has been poor every since her surgery although her weight has remained stable around 110lbs. Her white count today is 12.2K. At CT of abdomen showed a new small fluid collection in the pelvis measuring at 1.7cm. The abscess previously seen to right upper anterior abdomen has resolved. The CT also showed mildly dilated loops with a questionable stricture at anastomosis site. She reports early satiety since her last surgery. It does not appear that she had a bowel resection at that time, she had lysis of adhesions and a hernia repair. She is tachycardic in the 130s. She is afebrile and her blood pressure is stable.  Admission Diagnoses:  S/p exploratory laparotomy LOA and hiatal hernia repair 03/16/13  Pelvic abscess  Leukocytosis  Anemia  Dehydration   Hospital Course: The patient was admitted and placed on IVF and IV abx therapy.  The new fluid collection was unable to be drained.  On HD 1 she was tolerating a regular diet with no further nausea and almost no pain.  She was switched to oral abx therapy and was stable for dc home.  She was maintaining good ileostomy output with soft formed stool.  Abd: soft, minimally tender, +BS,  ostomy with good soft formed output, ND Heart: regular, mildly tachy Lungs: CTAB  Discharge Diagnoses:  Active Problems:   Pelvic abscess in female S/p exploratory laparotomy LOA and hiatal hernia repair 03/16/13  Pelvic fluid collection Leukocytosis, resolved  Anemia  Dehydration    Discharge Medications:   Medication List         amoxicillin-clavulanate 875-125 MG per tablet  Commonly known as:  AUGMENTIN  Take 1 tablet by mouth 2 (two) times daily.     arformoterol 15 MCG/2ML Nebu  Commonly known as:  BROVANA  Take 15 mcg by nebulization 2 (two) times daily.     aspirin 81 MG tablet  Take 81 mg by mouth 3 (three) times a week. Monday, Wednesday, and Friday     budesonide 0.25 MG/2ML nebulizer solution  Commonly known as:  PULMICORT  Take 0.25 mg by nebulization 2 (two) times daily.     celecoxib 200 MG capsule  Commonly known as:  CELEBREX  Take 200 mg by mouth daily.     citalopram 20 MG tablet  Commonly known as:  CELEXA  Take 20 mg by mouth daily.     diltiazem 180 MG 24 hr capsule  Commonly known as:  CARDIZEM CD  Take 180 mg by mouth 2 (two) times daily.     famotidine 20 MG tablet  Commonly known as:  PEPCID  Take 20 mg by mouth 2 (two) times daily.     fluticasone 50 MCG/ACT nasal spray  Commonly known as:  FLONASE  Place 2 sprays into the nose daily.  furosemide 20 MG tablet  Commonly known as:  LASIX  Take 20 mg by mouth 2 (two) times daily. Once or twice daily as needed for swelling     guaiFENesin 600 MG 12 hr tablet  Commonly known as:  MUCINEX  Take 1,200 mg by mouth 2 (two) times daily.     HYDROcodone-acetaminophen 5-325 MG per tablet  Commonly known as:  NORCO/VICODIN  Take 1-2 tablets by mouth every 4 (four) hours as needed for moderate pain.     HYDROcodone-acetaminophen 5-325 MG per tablet  Commonly known as:  NORCO/VICODIN  Take 1-2 tablets by mouth every 4 (four) hours as needed for moderate pain.     hydroxychloroquine  200 MG tablet  Commonly known as:  PLAQUENIL  Take 1 tablet by mouth Twice daily.     IRON PO  Take 1 tablet by mouth every Monday, Wednesday, and Friday.     LORazepam 0.5 MG tablet  Commonly known as:  ATIVAN  Take 0.5 mg by mouth every 8 (eight) hours as needed for anxiety.     metoprolol tartrate 25 MG tablet  Commonly known as:  LOPRESSOR  Take 0.5 tablets (12.5 mg total) by mouth 2 (two) times daily.     multivitamin with minerals Tabs tablet  Take 1 tablet by mouth daily.     omeprazole 40 MG capsule  Commonly known as:  PRILOSEC  Take 40 mg by mouth 2 (two) times daily.     rosuvastatin 5 MG tablet  Commonly known as:  CRESTOR  Take 5 mg by mouth every Monday, Wednesday, and Friday.     tiotropium 18 MCG inhalation capsule  Commonly known as:  SPIRIVA  Place 18 mcg into inhaler and inhale daily.     VICTOZA 18 MG/3ML Soln injection  Generic drug:  Liraglutide  Inject 1.8 mg into the skin daily. 1.8 units once daily     Vitamin D 1000 UNITS capsule  Take 1,000 Units by mouth daily.        Discharge Instructions:     Follow-up Information   Follow up with Mariella Saa, MD On 04/29/2013.   Specialty:  General Surgery   Contact information:   [address] [place] [address] [phone]     Will defer follow up CT scan to Dr. Johna Sheriff.  Signed: Janashia Parco E 04/20/2013, 11:09 AM

## 2013-04-20 NOTE — Plan of Care (Signed)
Problem: Food- and Nutrition-Related Knowledge Deficit (NB-1.1) Goal: Nutrition education Formal process to instruct or train a patient/client in a skill or to impart knowledge to help patients/clients voluntarily manage or modify food choices and eating behavior to maintain or improve health. Outcome: Progressing  RD consulted for education regarding High-Calorie, High-Protein nutrition therapy.  Reviewed patient's dietary recall. Provided examples on ways to increase caloric density of foods and beverages frequently consumed by the patient. Also provided ideas to promote variety and to incorporate additional nutrient dense foods into patient's diet. Discussed eating small frequent meals and snacks to assist in increasing overall po intake. Recommended Ensure Clear as pt reports that she does not like Ensure/Boost. Discussed how to obtain.  Review food frequency list with pt and encouraged meal spacing and decreased meal skipping.  Teach back method used.  Expect good compliance.  Body mass index is 20.63 kg/(m^2). Pt meets criteria for WNL based on current BMI.  Current diet order is CHO Modified, patient is consuming approximately 50% of meals at this time. Labs and medications reviewed. No further nutrition interventions warranted at this time. RD contact information provided. If additional nutrition issues arise, please re-consult RD.   Loyce Dys, MS RD LDN Clinical Inpatient Dietitian Pager: [phone] Weekend/After hours pager: [phone]

## 2013-04-20 NOTE — Discharge Summary (Signed)
Doing much better.  She  May bounce back, but currently there is no more treatment that is necessay.  [name]. Gae Bon, MD, FACS [phone] [facility]

## 2013-04-20 NOTE — H&P (Signed)
Patient well known to our service.  Will admit for antibiotics.  Fluid collectionin the pelvis is too smal for additional percutaenous drain.  Also not so sick the open drainage would be needed.  Marta Lamas. Gae Bon, MD, FACS [phone] [facility]

## 2013-04-20 NOTE — Progress Notes (Signed)
HHPT/OT/Aide/RN orders entered to resume previous home care for pt and [facility] liaison notified to be aware of services to resume.

## 2013-04-20 NOTE — Progress Notes (Signed)
Chaplain provided ministry of presence, emotional and spiritual support. Chaplain provided spiritual support through prayer.   04/20/13 1100  Clinical Encounter Type  Visited With Patient  Visit Type Initial;Spiritual support;Social support  Referral From Patient  Spiritual Encounters  Spiritual Needs Prayer;Emotional  Stress Factors  Patient Stress Factors Health changes

## 2013-04-20 NOTE — Progress Notes (Signed)
Patient active with [facility] Care Management services for COPD management. Patient was also active with [facility] Care Management services. Confirmed with [facility] and inpatient RNCM that patient will have resumption of home health services as well. Spoke with patient at bedside to make aware that [facility] will follow up with post hospital discharge call and will be evaluated for monthly home visits. [name], MSN-Ed, RN,BSN- [facility] [phone]

## 2013-04-25 ENCOUNTER — Encounter (HOSPITAL_COMMUNITY): Payer: Self-pay | Admitting: Emergency Medicine

## 2013-04-25 ENCOUNTER — Telehealth (INDEPENDENT_AMBULATORY_CARE_PROVIDER_SITE_OTHER): Payer: Self-pay | Admitting: General Surgery

## 2013-04-25 ENCOUNTER — Inpatient Hospital Stay (HOSPITAL_COMMUNITY)
Admission: EM | Admit: 2013-04-25 | Discharge: 2013-04-29 | DRG: 386 | Disposition: A | Discharge status: Home / Self Care | Payer: Medicare Other | Attending: Internal Medicine | Admitting: Internal Medicine

## 2013-04-25 DIAGNOSIS — R7989 Other specified abnormal findings of blood chemistry: Secondary | ICD-10-CM

## 2013-04-25 DIAGNOSIS — E43 Unspecified severe protein-calorie malnutrition: Secondary | ICD-10-CM

## 2013-04-25 DIAGNOSIS — N879 Dysplasia of cervix uteri, unspecified: Secondary | ICD-10-CM

## 2013-04-25 DIAGNOSIS — Z833 Family history of diabetes mellitus: Secondary | ICD-10-CM

## 2013-04-25 DIAGNOSIS — M810 Age-related osteoporosis without current pathological fracture: Secondary | ICD-10-CM

## 2013-04-25 DIAGNOSIS — K56609 Unspecified intestinal obstruction, unspecified as to partial versus complete obstruction: Secondary | ICD-10-CM

## 2013-04-25 DIAGNOSIS — D72829 Elevated white blood cell count, unspecified: Secondary | ICD-10-CM

## 2013-04-25 DIAGNOSIS — F329 Major depressive disorder, single episode, unspecified: Secondary | ICD-10-CM | POA: Diagnosis present

## 2013-04-25 DIAGNOSIS — A419 Sepsis, unspecified organism: Secondary | ICD-10-CM

## 2013-04-25 DIAGNOSIS — Z823 Family history of stroke: Secondary | ICD-10-CM

## 2013-04-25 DIAGNOSIS — R1110 Vomiting, unspecified: Secondary | ICD-10-CM

## 2013-04-25 DIAGNOSIS — J383 Other diseases of vocal cords: Secondary | ICD-10-CM

## 2013-04-25 DIAGNOSIS — IMO0001 Reserved for inherently not codable concepts without codable children: Secondary | ICD-10-CM

## 2013-04-25 DIAGNOSIS — F3289 Other specified depressive episodes: Secondary | ICD-10-CM

## 2013-04-25 DIAGNOSIS — J9610 Chronic respiratory failure, unspecified whether with hypoxia or hypercapnia: Secondary | ICD-10-CM

## 2013-04-25 DIAGNOSIS — Z87891 Personal history of nicotine dependence: Secondary | ICD-10-CM

## 2013-04-25 DIAGNOSIS — J439 Emphysema, unspecified: Secondary | ICD-10-CM

## 2013-04-25 DIAGNOSIS — K5080 Crohn's disease of both small and large intestine without complications: Principal | ICD-10-CM | POA: Diagnosis present

## 2013-04-25 DIAGNOSIS — K219 Gastro-esophageal reflux disease without esophagitis: Secondary | ICD-10-CM

## 2013-04-25 DIAGNOSIS — R109 Unspecified abdominal pain: Secondary | ICD-10-CM

## 2013-04-25 DIAGNOSIS — I10 Essential (primary) hypertension: Secondary | ICD-10-CM | POA: Diagnosis present

## 2013-04-25 DIAGNOSIS — F419 Anxiety disorder, unspecified: Secondary | ICD-10-CM

## 2013-04-25 DIAGNOSIS — K565 Intestinal adhesions [bands], unspecified as to partial versus complete obstruction: Secondary | ICD-10-CM | POA: Diagnosis present

## 2013-04-25 DIAGNOSIS — Z8249 Family history of ischemic heart disease and other diseases of the circulatory system: Secondary | ICD-10-CM

## 2013-04-25 DIAGNOSIS — Z8262 Family history of osteoporosis: Secondary | ICD-10-CM

## 2013-04-25 DIAGNOSIS — D649 Anemia, unspecified: Secondary | ICD-10-CM

## 2013-04-25 DIAGNOSIS — M069 Rheumatoid arthritis, unspecified: Secondary | ICD-10-CM

## 2013-04-25 DIAGNOSIS — E119 Type 2 diabetes mellitus without complications: Secondary | ICD-10-CM

## 2013-04-25 DIAGNOSIS — Z7982 Long term (current) use of aspirin: Secondary | ICD-10-CM

## 2013-04-25 DIAGNOSIS — N739 Female pelvic inflammatory disease, unspecified: Secondary | ICD-10-CM

## 2013-04-25 DIAGNOSIS — Z9981 Dependence on supplemental oxygen: Secondary | ICD-10-CM

## 2013-04-25 DIAGNOSIS — J4489 Other specified chronic obstructive pulmonary disease: Secondary | ICD-10-CM | POA: Diagnosis present

## 2013-04-25 DIAGNOSIS — IMO0002 Reserved for concepts with insufficient information to code with codable children: Secondary | ICD-10-CM

## 2013-04-25 DIAGNOSIS — R319 Hematuria, unspecified: Secondary | ICD-10-CM

## 2013-04-25 DIAGNOSIS — J449 Chronic obstructive pulmonary disease, unspecified: Secondary | ICD-10-CM | POA: Diagnosis present

## 2013-04-25 DIAGNOSIS — I498 Other specified cardiac arrhythmias: Secondary | ICD-10-CM

## 2013-04-25 DIAGNOSIS — E785 Hyperlipidemia, unspecified: Secondary | ICD-10-CM

## 2013-04-25 DIAGNOSIS — K5090 Crohn's disease, unspecified, without complications: Secondary | ICD-10-CM

## 2013-04-25 DIAGNOSIS — F411 Generalized anxiety disorder: Secondary | ICD-10-CM | POA: Diagnosis present

## 2013-04-25 LAB — CBC WITH DIFFERENTIAL/PLATELET
Basophils Absolute: 0 10*3/uL (ref 0.0–0.1)
Basophils Relative: 0 % (ref 0–1)
HCT: 32.2 % — ABNORMAL LOW (ref 36.0–46.0)
Hemoglobin: 10.5 g/dL — ABNORMAL LOW (ref 12.0–15.0)
Lymphocytes Relative: 16 % (ref 12–46)
Lymphs Abs: 2.4 10*3/uL (ref 0.7–4.0)
MCHC: 32.6 g/dL (ref 30.0–36.0)
Monocytes Absolute: 1 10*3/uL (ref 0.1–1.0)
Monocytes Relative: 6 % (ref 3–12)
Neutro Abs: 11.9 10*3/uL — ABNORMAL HIGH (ref 1.7–7.7)
Neutrophils Relative %: 76 % (ref 43–77)
RBC: 3.8 MIL/uL — ABNORMAL LOW (ref 3.87–5.11)
RDW: 14 % (ref 11.5–15.5)
WBC: 15.6 10*3/uL — ABNORMAL HIGH (ref 4.0–10.5)

## 2013-04-25 LAB — CULTURE, BLOOD (ROUTINE X 2): Culture: NO GROWTH

## 2013-04-25 LAB — COMPREHENSIVE METABOLIC PANEL
AST: 15 U/L (ref 0–37)
Albumin: 3 g/dL — ABNORMAL LOW (ref 3.5–5.2)
Alkaline Phosphatase: 86 U/L (ref 39–117)
BUN: 6 mg/dL (ref 6–23)
CO2: 23 mEq/L (ref 19–32)
Chloride: 97 mEq/L (ref 96–112)
Creatinine, Ser: 0.78 mg/dL (ref 0.50–1.10)
GFR calc non Af Amer: 87 mL/min — ABNORMAL LOW (ref >90)
Potassium: 3.8 mEq/L (ref 3.5–5.1)
Total Bilirubin: 0.2 mg/dL — ABNORMAL LOW (ref 0.3–1.2)

## 2013-04-25 LAB — LIPASE, BLOOD: Lipase: 13 U/L (ref 11–59)

## 2013-04-25 MED ORDER — SODIUM CHLORIDE 0.9 % IV BOLUS (SEPSIS)
1000.0000 mL | Freq: Once | INTRAVENOUS | Status: AC
  Administered 2013-04-26: 1000 mL via INTRAVENOUS

## 2013-04-25 MED ORDER — IOHEXOL 300 MG/ML  SOLN
20.0000 mL | INTRAMUSCULAR | Status: AC
  Administered 2013-04-25: 25 mL via ORAL

## 2013-04-25 NOTE — ED Notes (Signed)
Pt reports generalized abd pain, nausea, vomiting, and unable to eat x3 days. Pt states she has vomited all of her food and medications back up x3 days. Pt reports she as been here 4 times in the last month for the same. Pt also c/o dizziness with standing

## 2013-04-25 NOTE — Telephone Encounter (Signed)
Consulted with Dr Jamse Mead assistant and advised patient to go to the ER for evaluation. She has to wait until someone can take her, but she will go and be worked up through the ER.

## 2013-04-25 NOTE — ED Notes (Signed)
Pt given contrast for CT.

## 2013-04-25 NOTE — Telephone Encounter (Signed)
Patient's home health nurse called, Vickey Huger with [facility], to let us know patient has had trouble with vomiting since Saturday. I called and spoke with patient. She states she can get down a small amount of food but it comes back up two hours later. She states liquids go down better than solids but she she has been vomiting every two hours since Saturday. She has had no fevers. She has no energy and feels weak. She has a small amount of pain behind ileostomy but her main pain is in her ribs from her vomiting. She is having output from ileostomy and changing her bag every two hours. She is not on any medication for nausea. Please advise.

## 2013-04-25 NOTE — ED Provider Notes (Signed)
CSN: [id number]     Arrival date & time 04/25/13  1754 History   First MD Initiated Contact with Patient 04/25/13 2222     Chief Complaint  Patient presents with  . Emesis  . Abdominal Pain   (Consider location/radiation/quality/duration/timing/severity/associated sxs/prior Treatment) HPI [name] is a 62 y.o. female with history of crohn's disease s/p ileostomy and recent exploratory laparotomy on 10/22 with complications of pelvic abscess that required admission and drainage from 11/2-11-12 and subsequent abscess on 11/25 that required admission, but unfortunately they were unable to drain given size.  She was started on abx and sent home, but sicne discharge she now complains of worsening abdominal pain and vomiting that has been worsening over the last 4 days.  Pain 9/10 in severity.  Located in LUQ and LLQ.  Worse with palpation.  Better with nothing.  No pain radiation.  Associated with numerous NBNB episodes of vomiting.  No changes in ostomy output.    Past Medical History  Diagnosis Date  . Other diseases of vocal cords   . Osteoporosis, unspecified   . Other and unspecified hyperlipidemia   . Esophageal reflux   . Chronic airway obstruction, not elsewhere classified   . Regional enteritis of unspecified site   . 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 Crohn's disease     Ileostomy  . Pelvic adhesions   . Anxiety   . Depression   . Hypertension   . Emphysema   . Hematuria   . Severe cervical dysplasia age 50    cone biopsy with neg paps since  . Pneumonia     "3-4 times; necrotizing once" (04/19/2013)  . Asthmatic bronchitis , chronic   . Exertional shortness of breath   . On home oxygen therapy     "24/7; 2.5L @ rest; 3L when I'm doing my chores" (04/19/2013)  . Type II diabetes mellitus   . Anemia   . History of blood transfusion     "I've had 3 or 4" (04/19/2013)  . Rheumatoid arthritis(714.0) 03/2012    [place]; "mild in my hands"  (04/19/2013)   Past Surgical History  Procedure Laterality Date  . Ileostomy  2007  . Ear cyst excision Bilateral 1970's    "lobes"   . Cervical cone biopsy  1978  . Colon surgery  2007    subtotal colectomy with ileostomy  . Esophagogastroduodenoscopy (egd) with esophageal dilation      "3 times, I think" (04/19/2013)  . Oophorectomy      LSO  . Tracheostomy  2010  . Hammer toe surgery Bilateral 1980's  . Balloon dilation  02/10/2012    Procedure: BALLOON DILATION;  Surgeon: Charolett Bumpers, MD;  Location: [facility] ENDOSCOPY;  Service: Endoscopy;  Laterality: N/A;  . Colposcopy    . Flexible sigmoidoscopy  03/26/2012    Procedure: FLEXIBLE SIGMOIDOSCOPY;  Surgeon: Charolett Bumpers, MD;  Location: [facility] ENDOSCOPY;  Service: Endoscopy;  Laterality: N/A;  . Cardiac catheterization  07/08/2007    normal  . Nm myocar perf wall motion  09/21/2006    no ischemia  . Ventral hernia repair N/A 03/16/2013    Procedure: REPAIR OF INCISIONAL AND PARASTOMAL HERNIAS;  Surgeon: Mariella Saa, MD;  Location: [facility] OR;  Service: General;  Laterality: N/A;  . Laparotomy N/A 03/16/2013    Procedure: EXPLORATORY LAPAROTOMY;  Surgeon: Mariella Saa, MD;  Location: [facility] OR;  Service: General;  Laterality: N/A;  . Lysis of adhesion N/A 03/16/2013  Procedure: LYSIS OF ADHESIONS FOR SMALL BOWEL OBSTRUCTION;  Surgeon: Mariella Saa, MD;  Location: [facility] OR;  Service: General;  Laterality: N/A;  . Insertion of mesh N/A 03/16/2013    Procedure: INSERTION OF BIOLOGIC MESH;  Surgeon: Mariella Saa, MD;  Location: [facility] OR;  Service: General;  Laterality: N/A;  . Tee without cardioversion N/A 03/21/2013    Procedure: TRANSESOPHAGEAL ECHOCARDIOGRAM (TEE);  Surgeon: Vesta Mixer, MD;  Location: [facility] ENDOSCOPY;  Service: Cardiovascular;  Laterality: N/A;  . Vaginal hysterectomy  1993    LAVH-LSO   Family History  Problem Relation Age of Onset  . Osteoporosis Mother   . Kidney disease Mother   .  Hypertension Sister   . Stroke Sister   . Diabetes Sister    History  Substance Use Topics  . Smoking status: Former Smoker -- 1.00 packs/day for 32 years    Types: Cigarettes    Quit date: 05/26/2004  . Smokeless tobacco: Never Used  . Alcohol Use: 0.0 oz/week     Comment: 04/19/2013 "have a drink a couple times/yr"   OB History   Grav Para Term Preterm Abortions TAB SAB Ect Mult Living   3 0   3     0     Review of Systems  Constitutional: Negative for fever and chills.  HENT: Negative for congestion and rhinorrhea.   Respiratory: Negative for cough and shortness of breath.   Cardiovascular: Negative for chest pain.  Gastrointestinal: Positive for nausea, vomiting, abdominal pain and abdominal distention. Negative for diarrhea.  Endocrine: Negative for polyuria.  Genitourinary: Negative for dysuria.  Musculoskeletal: Negative for neck pain and neck stiffness.  Skin: Negative for rash.  Neurological: Negative for headaches.  Psychiatric/Behavioral: Negative.     Allergies  Esomeprazole magnesium; Shellfish allergy; Surgical lubricant; Other; Peanut-containing drug products; and Levaquin  Home Medications   Current Outpatient Rx  Name  Route  Sig  Dispense  Refill  . amoxicillin-clavulanate (AUGMENTIN) 875-125 MG per tablet   Oral   Take 1 tablet by mouth 2 (two) times daily.   14 tablet   0   . arformoterol (BROVANA) 15 MCG/2ML NEBU   Nebulization   Take 15 mcg by nebulization 2 (two) times daily.         [name] aspirin 81 MG tablet   Oral   Take 81 mg by mouth every Monday, Wednesday, and Friday.          . budesonide (PULMICORT) 0.25 MG/2ML nebulizer solution   Nebulization   Take 0.25 mg by nebulization 2 (two) times daily.         . celecoxib (CELEBREX) 200 MG capsule   Oral   Take 200 mg by mouth daily.         . Cholecalciferol (VITAMIN D) 1000 UNITS capsule   Oral   Take 1,000 Units by mouth daily.          . citalopram (CELEXA) 20 MG  tablet   Oral   Take 20 mg by mouth daily.          [name] diltiazem (CARDIZEM CD) 180 MG 24 hr capsule   Oral   Take 180 mg by mouth 2 (two) times daily.         . famotidine (PEPCID) 20 MG tablet   Oral   Take 20 mg by mouth 2 (two) times daily.         . fluticasone (FLONASE) 50 MCG/ACT nasal spray   Nasal  Place 2 sprays into the nose daily.         . furosemide (LASIX) 20 MG tablet   Oral   Take 20 mg by mouth 2 (two) times daily. Once or twice daily as needed for swelling         . HYDROcodone-acetaminophen (NORCO/VICODIN) 5-325 MG per tablet   Oral   Take 1-2 tablets by mouth every 4 (four) hours as needed for moderate pain.   30 tablet   0   . hydroxychloroquine (PLAQUENIL) 200 MG tablet   Oral   Take 200 mg by mouth Twice daily.          . IRON PO   Oral   Take 1 tablet by mouth every Monday, Wednesday, and Friday.          . Liraglutide (VICTOZA) 18 MG/3ML SOLN   Subcutaneous   Inject 1.8 mg into the skin daily. 1.8 units once daily         . LORazepam (ATIVAN) 0.5 MG tablet   Oral   Take 0.5 mg by mouth every 8 (eight) hours as needed for anxiety.         . metoprolol tartrate (LOPRESSOR) 25 MG tablet   Oral   Take 0.5 tablets (12.5 mg total) by mouth 2 (two) times daily.   30 tablet   9   . Multiple Vitamin (MULTIVITAMIN WITH MINERALS) TABS   Oral   Take 1 tablet by mouth daily.         [name] omeprazole (PRILOSEC) 40 MG capsule   Oral   Take 40 mg by mouth 2 (two) times daily.          [name] Respiratory Therapy Supplies (FLUTTER) DEVI   Inhalation   Inhale 2 application into the lungs every 4 (four) hours.          . rosuvastatin (CRESTOR) 5 MG tablet   Oral   Take 5 mg by mouth every Monday, Wednesday, and Friday.          . tiotropium (SPIRIVA) 18 MCG inhalation capsule   Inhalation   Place 18 mcg into inhaler and inhale daily.          BP 105/86  Pulse 125  Temp(Src) 98.3 F (36.8 C) (Oral)  Resp 24  SpO2  100% Physical Exam  Nursing note and vitals reviewed. Constitutional: She is oriented to person, place, and time. She appears well-developed and well-nourished. No distress.  HENT:  Head: Normocephalic and atraumatic.  Right Ear: External ear normal.  Left Ear: External ear normal.  Nose: Nose normal.  Mouth/Throat: Oropharynx is clear and moist. No oropharyngeal exudate.  Eyes: EOM are normal. Pupils are equal, round, and reactive to light.  Neck: Normal range of motion. Neck supple. No tracheal deviation present.  Cardiovascular: Normal rate.   Pulmonary/Chest: Effort normal and breath sounds normal. No stridor. No respiratory distress. She has no wheezes. She has no rales.  Abdominal: Soft. She exhibits no distension. There is tenderness in the left upper quadrant and left lower quadrant. There is guarding. There is no rigidity and no rebound.  Ostomy c/d/i.  Output noted in bag.  Musculoskeletal: Normal range of motion.  Neurological: She is alert and oriented to person, place, and time.  Skin: Skin is warm and dry. She is not diaphoretic.    ED Course  Procedures (including critical care time) Labs Review Labs Reviewed  CBC WITH DIFFERENTIAL - Abnormal; Notable for the following:  WBC 15.6 (*)    RBC 3.80 (*)    Hemoglobin 10.5 (*)    HCT 32.2 (*)    Neutro Abs 11.9 (*)    All other components within normal limits  COMPREHENSIVE METABOLIC PANEL - Abnormal; Notable for the following:    Albumin 3.0 (*)    Total Bilirubin 0.2 (*)    GFR calc non Af Amer 87 (*)    All other components within normal limits  LIPASE, BLOOD   Imaging Review No results found.  EKG Interpretation   None       MDM   1. Leukocytosis   2. Abdominal pain   3. Vomiting   4. Crohn's disease    [name] is a 62 y.o. female with history of Crohn's disease who presented to the emergency department for concern of worsening abdominal pain and vomiting.  Patient has had recent  pelvic abscess, ex-lap, and antibiotics in last  6 weeks with a total of 3 admissions during this time.  Labs notable for leukocytosis to 15.6.  CT scan ordered.  Patient signed out to Dr. Ranae Palms pending results of CT scan at 0100.    Arloa Koh, MD 04/26/13 9604

## 2013-04-25 NOTE — Telephone Encounter (Signed)
I have not seen the patient since her surgery done weeks ago as an emergency and so difficult for me to assess. However I think she needs to be seen today in the office or the emergency department.

## 2013-04-26 ENCOUNTER — Inpatient Hospital Stay (HOSPITAL_COMMUNITY): Payer: Medicare Other

## 2013-04-26 ENCOUNTER — Emergency Department (HOSPITAL_COMMUNITY): Payer: Medicare Other

## 2013-04-26 ENCOUNTER — Encounter (HOSPITAL_COMMUNITY): Payer: Self-pay | Admitting: Radiology

## 2013-04-26 DIAGNOSIS — R109 Unspecified abdominal pain: Secondary | ICD-10-CM

## 2013-04-26 DIAGNOSIS — R1110 Vomiting, unspecified: Secondary | ICD-10-CM | POA: Diagnosis present

## 2013-04-26 DIAGNOSIS — K56609 Unspecified intestinal obstruction, unspecified as to partial versus complete obstruction: Secondary | ICD-10-CM | POA: Diagnosis present

## 2013-04-26 DIAGNOSIS — E119 Type 2 diabetes mellitus without complications: Secondary | ICD-10-CM

## 2013-04-26 DIAGNOSIS — J9610 Chronic respiratory failure, unspecified whether with hypoxia or hypercapnia: Secondary | ICD-10-CM

## 2013-04-26 DIAGNOSIS — R112 Nausea with vomiting, unspecified: Secondary | ICD-10-CM

## 2013-04-26 DIAGNOSIS — K3184 Gastroparesis: Secondary | ICD-10-CM

## 2013-04-26 LAB — GLUCOSE, CAPILLARY
Glucose-Capillary: 43 mg/dL — CL (ref 70–99)
Glucose-Capillary: 153 mg/dL — ABNORMAL HIGH (ref 70–99)
Glucose-Capillary: 85 mg/dL (ref 70–99)

## 2013-04-26 LAB — CREATININE, SERUM: GFR calc non Af Amer: 72 mL/min — ABNORMAL LOW (ref >90)

## 2013-04-26 LAB — CBC
Hemoglobin: 9.5 g/dL — ABNORMAL LOW (ref 12.0–15.0)
MCH: 27.5 pg (ref 26.0–34.0)
MCV: 84.6 fL (ref 78.0–100.0)
Platelets: 296 10*3/uL (ref 150–400)
RDW: 14.6 % (ref 11.5–15.5)

## 2013-04-26 MED ORDER — ARFORMOTEROL TARTRATE 15 MCG/2ML IN NEBU
15.0000 ug | INHALATION_SOLUTION | Freq: Two times a day (BID) | RESPIRATORY_TRACT | Status: DC
  Administered 2013-04-26 – 2013-04-29 (×6): 15 ug via RESPIRATORY_TRACT
  Filled 2013-04-26 (×9): qty 2

## 2013-04-26 MED ORDER — DEXTROSE-NACL 5-0.9 % IV SOLN
INTRAVENOUS | Status: DC
  Administered 2013-04-26 – 2013-04-28 (×4): via INTRAVENOUS

## 2013-04-26 MED ORDER — GLUCOSE 40 % PO GEL
ORAL | Status: AC
  Administered 2013-04-26: 1
  Filled 2013-04-26: qty 1

## 2013-04-26 MED ORDER — ENOXAPARIN SODIUM 40 MG/0.4ML ~~LOC~~ SOLN
40.0000 mg | SUBCUTANEOUS | Status: DC
  Administered 2013-04-26 – 2013-04-28 (×3): 40 mg via SUBCUTANEOUS
  Filled 2013-04-26 (×4): qty 0.4

## 2013-04-26 MED ORDER — TIOTROPIUM BROMIDE MONOHYDRATE 18 MCG IN CAPS
18.0000 ug | ORAL_CAPSULE | Freq: Every day | RESPIRATORY_TRACT | Status: DC
  Administered 2013-04-26 – 2013-04-29 (×4): 18 ug via RESPIRATORY_TRACT
  Filled 2013-04-26: qty 5

## 2013-04-26 MED ORDER — FLUTICASONE PROPIONATE 50 MCG/ACT NA SUSP
2.0000 | Freq: Every day | NASAL | Status: DC
  Filled 2013-04-26: qty 16

## 2013-04-26 MED ORDER — SODIUM CHLORIDE 0.9 % IV BOLUS (SEPSIS)
1000.0000 mL | Freq: Once | INTRAVENOUS | Status: AC
  Administered 2013-04-26: 1000 mL via INTRAVENOUS

## 2013-04-26 MED ORDER — ONDANSETRON HCL 4 MG/2ML IJ SOLN
4.0000 mg | Freq: Once | INTRAMUSCULAR | Status: AC
  Administered 2013-04-26: 4 mg via INTRAVENOUS
  Filled 2013-04-26: qty 2

## 2013-04-26 MED ORDER — BUDESONIDE 0.25 MG/2ML IN SUSP
0.2500 mg | Freq: Two times a day (BID) | RESPIRATORY_TRACT | Status: DC
  Administered 2013-04-26 – 2013-04-29 (×7): 0.25 mg via RESPIRATORY_TRACT
  Filled 2013-04-26 (×9): qty 2

## 2013-04-26 MED ORDER — HYDROXYCHLOROQUINE SULFATE 200 MG PO TABS
200.0000 mg | ORAL_TABLET | Freq: Two times a day (BID) | ORAL | Status: DC
  Administered 2013-04-26 – 2013-04-29 (×6): 200 mg via ORAL
  Filled 2013-04-26 (×8): qty 1

## 2013-04-26 MED ORDER — ONDANSETRON HCL 4 MG/2ML IJ SOLN
4.0000 mg | Freq: Four times a day (QID) | INTRAMUSCULAR | Status: DC | PRN
  Administered 2013-04-27: 4 mg via INTRAVENOUS
  Filled 2013-04-26: qty 2

## 2013-04-26 MED ORDER — HYDROMORPHONE HCL PF 1 MG/ML IJ SOLN
1.0000 mg | Freq: Once | INTRAMUSCULAR | Status: DC
  Filled 2013-04-26: qty 1

## 2013-04-26 MED ORDER — INSULIN ASPART 100 UNIT/ML ~~LOC~~ SOLN: 0.0000 [IU] | Freq: Three times a day (TID) | SUBCUTANEOUS | Status: DC

## 2013-04-26 MED ORDER — ONDANSETRON HCL 4 MG PO TABS: 4.0000 mg | ORAL_TABLET | Freq: Four times a day (QID) | ORAL | Status: DC | PRN

## 2013-04-26 MED ORDER — DILTIAZEM HCL ER COATED BEADS 180 MG PO CP24
180.0000 mg | ORAL_CAPSULE | Freq: Two times a day (BID) | ORAL | Status: DC
  Administered 2013-04-27 – 2013-04-29 (×4): 180 mg via ORAL
  Filled 2013-04-26 (×8): qty 1

## 2013-04-26 MED ORDER — SODIUM CHLORIDE 0.9 % IJ SOLN
3.0000 mL | Freq: Two times a day (BID) | INTRAMUSCULAR | Status: DC
  Administered 2013-04-28 (×2): 3 mL via INTRAVENOUS

## 2013-04-26 MED ORDER — METOPROLOL TARTRATE 12.5 MG HALF TABLET
12.5000 mg | ORAL_TABLET | Freq: Two times a day (BID) | ORAL | Status: DC
  Administered 2013-04-27 – 2013-04-29 (×4): 12.5 mg via ORAL
  Filled 2013-04-26 (×6): qty 1

## 2013-04-26 MED ORDER — ONDANSETRON HCL 4 MG/2ML IJ SOLN: 4.0000 mg | Freq: Three times a day (TID) | INTRAMUSCULAR | Status: DC | PRN

## 2013-04-26 MED ORDER — IOHEXOL 300 MG/ML  SOLN
100.0000 mL | Freq: Once | INTRAMUSCULAR | Status: AC | PRN
  Administered 2013-04-26: 100 mL via INTRAVENOUS

## 2013-04-26 MED ORDER — SODIUM CHLORIDE 0.9 % IV SOLN: INTRAVENOUS | Status: DC

## 2013-04-26 MED ORDER — PROMETHAZINE HCL 25 MG/ML IJ SOLN: 12.5000 mg | Freq: Four times a day (QID) | INTRAMUSCULAR | Status: DC | PRN

## 2013-04-26 NOTE — ED Provider Notes (Signed)
I saw and evaluated the patient, reviewed the resident's note and I agree with the findings and plan.  EKG Interpretation    Date/Time:    Ventricular Rate:    PR Interval:    QRS Duration:   QT Interval:    QTC Calculation:   R Axis:     Text Interpretation:              Pt with abd pain, vomiting.  Complicated recent hx with multiple abscess collections in abdomen.  Awaiting CT.  Rolan Bucco, MD 04/26/13 2350

## 2013-04-26 NOTE — Consult Note (Signed)
Reason for Consult: nausea, vomiting and abdominal pain Referring Physician: Dr. Ardeen Jourdain   HPI: [name] is a 62 year old patient of Dr. Johna Sheriff with a history of COPD, diabetes mellitus, anemia, s/p exploratory laparotomy on 03/16/13, previous ileostomy. She was hospitalized 03/27/13-04/06/13 due to a pelvic abscess which required IR drain placement. She was admitted again 11/25-11/26 with n/v and abdominal pain.  She was found to have a decreasing intra-abdominal abscess unable to be drained by IR.  She improved overnight and was discharged home the following day. She reports the same symptoms which started 3 days ago, nausea and vomiting after she starts to eat.  She reports having a hard time swallowing pills as well.  Her pain worsens the more she vomits or "dry heaves."    She reports adequate output from her ostomy.  She denies fever, chills or sweats. She reports that her appetite has been poor every since her surgery.  A repeat CT of abdomen showed a decrease in the fluid collection and delayed transit through a segment of bowel in the right pelvis.    Past Medical History  Diagnosis Date  . Other diseases of vocal cords   . Osteoporosis, unspecified   . Other and unspecified hyperlipidemia   . Esophageal reflux   . Chronic airway obstruction, not elsewhere classified   . Regional enteritis of unspecified site   . 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 Crohn's disease     Ileostomy  . Pelvic adhesions   . Anxiety   . Depression   . Hypertension   . Emphysema   . Hematuria   . Severe cervical dysplasia age 55    cone biopsy with neg paps since  . Pneumonia     "3-4 times; necrotizing once" (04/19/2013)  . Asthmatic bronchitis , chronic   . Exertional shortness of breath   . On home oxygen therapy     "24/7; 2.5L @ rest; 3L when I'm doing my chores" (04/19/2013)  . Type II diabetes mellitus   . Anemia   . History of blood transfusion     "I've had 3  or 4" (04/19/2013)  . Rheumatoid arthritis(714.0) 03/2012    B[place]ckman; "mild in my hands" (04/19/2013)    Past Surgical History  Procedure Laterality Date  . Ileostomy  2007  . Ear cyst excision Bilateral 1970's    "lobes"   . Cervical cone biopsy  1978  . Colon surgery  2007    subtotal colectomy with ileostomy  . Esophagogastroduodenoscopy (egd) with esophageal dilation      "3 times, I think" (04/19/2013)  . Oophorectomy      LSO  . Tracheostomy  2010  . Hammer toe surgery Bilateral 1980's  . Balloon dilation  02/10/2012    Procedure: BALLOON DILATION;  Surgeon: Charolett Bumpers, MD;  Location: [facility]OS[facility];  Service: Endoscopy;  Laterality: N/A;  . Colposcopy    . Flexible sigmoidoscopy  03/26/2012    Procedure: FLEXIBLE SIGMOIDOSCOPY;  Surgeon: Charolett Bumpers, MD;  Location: [facility];  Service: Endoscopy;  Laterality: N/A;  . Cardiac catheterization  07/08/2007    normal  . Nm myocar perf wall motion  09/21/2006    no ischemia  . Ventral hernia repair N/A 03/16/2013    Procedure: REPAIR OF INCISIONAL AND PARASTOMAL HERNIAS;  Surgeon: Mariella Saa, MD;  Location: [facility] OR;  Service: General;  Laterality: N/A;  . Laparotomy N/A 03/16/2013    Procedure: EXPLORATORY LAPAROTOMY;  Surgeon: Mariella Saa, MD;  Location: [facility] OR;  Service: General;  Laterality: N/A;  . Lysis of adhesion N/A 03/16/2013    Procedure: LYSIS OF ADHESIONS FOR SMALL BOWEL OBSTRUCTION;  Surgeon: Mariella Saa, MD;  Location: [facility] OR;  Service: General;  Laterality: N/A;  . Insertion of mesh N/A 03/16/2013    Procedure: INSERTION OF BIOLOGIC MESH;  Surgeon: Mariella Saa, MD;  Location: [facility] OR;  Service: General;  Laterality: N/A;  . Tee without cardioversion N/A 03/21/2013    Procedure: TRANSESOPHAGEAL ECHOCARDIOGRAM (TEE);  Surgeon: Vesta Mixer, MD;  Location: [facility] ENDOSCOPY;  Service: Cardiovascular;  Laterality: N/A;  . Vaginal hysterectomy  1993    LAVH-LSO    Family History   Problem Relation Age of Onset  . Osteoporosis Mother   . Kidney disease Mother   . Hypertension Sister   . Stroke Sister   . Diabetes Sister     Social History:  reports that she quit smoking about 8 years ago. Her smoking use included Cigarettes. She has a 32 pack-year smoking history. She has never used smokeless tobacco. She reports that she drinks alcohol. She reports that she does not use illicit drugs.  Allergies:  Allergies  Allergen Reactions  . Esomeprazole Magnesium Other (See Comments)    NEXIUM - reaction > aggravated pt's Crohn's disease  . Shellfish Allergy Shortness Of Breath and Nausea And Vomiting  . Surgical Lubricant Other (See Comments)    Burns skin   . Other Nausea And Vomiting    Beans, Dander/Dust, Peas, Mushrooms  . Peanut-Containing Drug Products Nausea And Vomiting  . Levaquin [Levofloxacin]     Medications: {medication reviewed  Results for orders placed during the hospital encounter of 04/25/13 (from the past 48 hour(s))  CBC WITH DIFFERENTIAL     Status: Abnormal   Collection Time    04/25/13  6:30 PM      Result Value Range   WBC 15.6 (*) 4.0 - 10.5 K/uL   RBC 3.80 (*) 3.87 - 5.11 MIL/uL   Hemoglobin 10.5 (*) 12.0 - 15.0 g/dL   HCT 69.6 (*) 29.5 - 28.4 %   MCV 84.7  78.0 - 100.0 fL   MCH 27.6  26.0 - 34.0 pg   MCHC 32.6  30.0 - 36.0 g/dL   RDW 13.2  44.0 - 10.2 %   Platelets 335  150 - 400 K/uL   Neutrophils Relative % 76  43 - 77 %   Neutro Abs 11.9 (*) 1.7 - 7.7 K/uL   Lymphocytes Relative 16  12 - 46 %   Lymphs Abs 2.4  0.7 - 4.0 K/uL   Monocytes Relative 6  3 - 12 %   Monocytes Absolute 1.0  0.1 - 1.0 K/uL   Eosinophils Relative 2  0 - 5 %   Eosinophils Absolute 0.3  0.0 - 0.7 K/uL   Basophils Relative 0  0 - 1 %   Basophils Absolute 0.0  0.0 - 0.1 K/uL  COMPREHENSIVE METABOLIC PANEL     Status: Abnormal   Collection Time    04/25/13  6:30 PM      Result Value Range   Sodium 135  135 - 145 mEq/L   Potassium 3.8  3.5 - 5.1  mEq/L   Chloride 97  96 - 112 mEq/L   CO2 23  19 - 32 mEq/L   Glucose, Bld 75  70 - 99 mg/dL   BUN 6  6 - 23 mg/dL  Creatinine, Ser 0.78  0.50 - 1.10 mg/dL   Calcium 9.7  8.4 - 16.1 mg/dL   Total Protein 7.7  6.0 - 8.3 g/dL   Albumin 3.0 (*) 3.5 - 5.2 g/dL   AST 15  0 - 37 U/L   ALT 7  0 - 35 U/L   Alkaline Phosphatase 86  39 - 117 U/L   Total Bilirubin 0.2 (*) 0.3 - 1.2 mg/dL   GFR calc non Af Amer 87 (*) >90 mL/min   GFR calc Af Amer >90  >90 mL/min   Comment: (NOTE)     The eGFR has been calculated using the CKD EPI equation.     This calculation has not been validated in all clinical situations.     eGFR's persistently <90 mL/min signify possible Chronic Kidney     Disease.  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   Electronically Signed   By: Jearld Lesch M.D.   On: 04/26/2013 03:03   04/26/2013   CLINICAL DATA:  Abdominal pain  EXAM: CT ABDOMEN AND PELVIS WITH CONTRAST  TECHNIQUE: Multidetector CT imaging of the abdomen and pelvis was performed using the standard protocol following bolus administration of intravenous contrast.  CONTRAST:  OMNIPAQUE IOHEXOL 300 MG/ML  SOLN  COMPARISON:  04/19/2013  FINDINGS: Lung bases clear.  Normal heart size.  Unremarkable liver, biliary system, spleen, pancreas, adrenal glands. Bilateral too small to further characterize renal hypodensities. Otherwise, symmetric renal enhancement. No hydroureteronephrosis.  No complete bowel obstruction, with contrast reaching the right lower quadrant ostomy. Evidence of partial colectomy. Decrease in size of the fluid collection along the vaginal cuff, though mild complexity and fluid persists. Adjacent small bowel loop is kinked, with proximal  dilatation up to 3 cm. And there is a thickened loop within the left lower pelvis on image 56. However, contrast is seen distal to this loop. No free intraperitoneal air or new fluid collection. No lymphadenopathy.  Normal caliber aorta and branch vessels with scattered atherosclerotic disease. Coils within the right upper quadrant.  Partially decompressed bladder. Uterus presumed surgically absent. No adnexal mass.  No acute osseous finding.  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 There is no complete obstruction at this time however there is delayed transit through a segment of bowel in the right pelvis, which may cause partial or intermittent obstruction.  Fluid collection of indeterminate sterility within the pelvis adjacent to the vaginal cuff, however decreased in size in the interval.  Electronically Signed: By: Jearld Lesch M.D. On: 04/26/2013 02:49    ROS Blood pressure 107/52, pulse 115, temperature 99 F (37.2 C), temperature source Oral, resp. rate 20, SpO2 96.00%. Physical Exam  Constitutional: She is oriented to person, place, and time. She appears well-developed. No distress.  Cardiovascular: Normal rate, regular rhythm, normal heart sounds and intact distal pulses.  Exam reveals no gallop and no friction rub.   No murmur heard. Respiratory: Breath sounds normal. No respiratory distress. She has no wheezes. She exhibits no tenderness.  GI: Soft. Bowel sounds are normal. There is no guarding.  Diffuse TTP, no evidence of an acute abdomen  Musculoskeletal: She exhibits no edema.  Neurological: She is alert and oriented to person, place, and time.  Skin: She is not diaphoretic.    Assessment/Plan: Nausea, vomiting and abdominal pain: etiology unclear, she does not appear to have a surgical abdomen.  Recommend obtaining a stool for c. Diff given recent antibiotic therapy.  I have also placed a consult to [facility] Dr. Laural Benes)  Bonner Puna,  [facility]  ANP-BC  04/26/2013, 8:27 AM

## 2013-04-26 NOTE — ED Notes (Signed)
Pt transferred to Pod C. Eating ice chips. Introduced self. Pt offers no complaint at this time.

## 2013-04-26 NOTE — H&P (Addendum)
[facility] History and Physical  [name] UJW:[id number] DOB: November 04, 1950 DOA: 04/25/2013  Referring physician: EDP PCP: [name], MD  Specialists: Dr Johna Sheriff  Chief Complaint: persistent nausea and vomiting for many days  HPI: [name] is a 62 y.o. 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 She is back today with persistent nausea and vomiting for a few days associated with midline abdominal tenderness. On arrival to ED, she underwent a CT abdomen and pelvis, did not reveal any definite obstruction, but there may be partial obstruction. Surgery and GI consulted. And she was referred to medical service for admission.   Review of Systems: The patient denies  fever, weight loss,, vision loss, decreased hearing, hoarseness, chest pain, syncope, dyspnea on exertion, peripheral edema, balance deficits, hemoptysis,  melena, hematochezia, severe indigestion/heartburn, hematuria, incontinence, genital sores, muscle weakness, suspicious skin lesions, transient blindness, difficulty walking, depression, unusual weight change, abnormal bleeding, enlarged lymph nodes, angioedema, and breast masses.    Past Medical History  Diagnosis Date  . Other diseases of vocal cords   . Osteoporosis, unspecified   . Other and unspecified hyperlipidemia   . Esophageal reflux   . Chronic airway obstruction, not elsewhere classified   . Regional enteritis of unspecified site   . 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 Crohn's disease     Ileostomy  . Pelvic adhesions   . Anxiety   . Depression   . Hypertension   . Emphysema   . Hematuria   . Severe cervical dysplasia age 62    cone biopsy with neg paps since  . Pneumonia      "3-4 times; necrotizing once" (04/19/2013)  . Asthmatic bronchitis , chronic   . Exertional shortness of breath   . On home oxygen therapy     "24/7; 2.5L @ rest; 3L when I'm doing my chores" (04/19/2013)  . Type II diabetes mellitus   . Anemia   . History of blood transfusion     "I've had 3 or 4" (04/19/2013)  . Rheumatoid arthritis(714.0) 03/2012    [place]; "mild in my hands" (04/19/2013)   Past Surgical History  Procedure Laterality Date  . Ileostomy  2007  . Ear cyst excision Bilateral 1970's    "lobes"   . Cervical cone biopsy  1978  . Colon surgery  2007    subtotal colectomy with ileostomy  . Esophagogastroduodenoscopy (egd) with esophageal dilation      "3 times, I think" (04/19/2013)  . Oophorectomy      LSO  . Tracheostomy  2010  . Hammer toe surgery Bilateral 1980's  . Balloon dilation  02/10/2012    Procedure: BALLOON DILATION;  Surgeon: Charolett Bumpers, MD;  Location: [facility] ENDOSCOPY;  Service: Endoscopy;  Laterality: N/A;  . Colposcopy    . Flexible sigmoidoscopy  03/26/2012    Procedure: FLEXIBLE SIGMOIDOSCOPY;  Surgeon: Charolett Bumpers, MD;  Location: [facility];  Service: Endoscopy;  Laterality: N/A;  . Cardiac catheterization  07/08/2007    normal  . Nm myocar perf wall motion  09/21/2006    no ischemia  . Ventral hernia repair N/A 03/16/2013    Procedure: REPAIR OF INCISIONAL AND PARASTOMAL HERNIAS;  Surgeon: Mariella Saa, MD;  Location: [facility] OR;  Service: General;  Laterality: N/A;  .  Laparotomy N/A 03/16/2013    Procedure: EXPLORATORY LAPAROTOMY;  Surgeon: Mariella Saa, MD;  Location: [facility] OR;  Service: General;  Laterality: N/A;  . Lysis of adhesion N/A 03/16/2013    Procedure: LYSIS OF ADHESIONS FOR SMALL BOWEL OBSTRUCTION;  Surgeon: Mariella Saa, MD;  Location: [facility] OR;  Service: General;  Laterality: N/A;  . Insertion of mesh N/A 03/16/2013    Procedure: INSERTION OF BIOLOGIC MESH;  Surgeon: Mariella Saa, MD;  Location: [facility] OR;   Service: General;  Laterality: N/A;  . Tee without cardioversion N/A 03/21/2013    Procedure: TRANSESOPHAGEAL ECHOCARDIOGRAM (TEE);  Surgeon: Vesta Mixer, MD;  Location: [facility] ENDOSCOPY;  Service: Cardiovascular;  Laterality: N/A;  . Vaginal hysterectomy  1993    LAVH-LSO   Social History:  reports that she quit smoking about 8 years ago. Her smoking use included Cigarettes. She has a 32 pack-year smoking history. She has never used smokeless tobacco. She reports that she drinks alcohol. She reports that she does not use illicit drugs.  where does patient live--home,  Can patient participate in ADLs? yes  Allergies  Allergen Reactions  . Esomeprazole Magnesium Other (See Comments)    NEXIUM - reaction > aggravated pt's Crohn's disease  . Shellfish Allergy Shortness Of Breath and Nausea And Vomiting  . Surgical Lubricant Other (See Comments)    Burns skin   . Other Nausea And Vomiting    Beans, Dander/Dust, Peas, Mushrooms  . Peanut-Containing Drug Products Nausea And Vomiting  . Levaquin [Levofloxacin]     Family History  Problem Relation Age of Onset  . Osteoporosis Mother   . Kidney disease Mother   . Hypertension Sister   . Stroke Sister   . Diabetes Sister     Prior to Admission medications   Medication Sig Start Date End Date Taking? Authorizing Provider  amoxicillin-clavulanate (AUGMENTIN) 875-125 MG per tablet Take 1 tablet by mouth 2 (two) times daily. 04/20/13  Yes Letha Cape, PA-C  arformoterol (BROVANA) 15 MCG/2ML NEBU Take 15 mcg by nebulization 2 (two) times daily.   Yes Historical Provider, MD  aspirin 81 MG tablet Take 81 mg by mouth every Monday, Wednesday, and Friday.    Yes Historical Provider, MD  budesonide (PULMICORT) 0.25 MG/2ML nebulizer solution Take 0.25 mg by nebulization 2 (two) times daily.   Yes Historical Provider, MD  celecoxib (CELEBREX) 200 MG capsule Take 200 mg by mouth daily.   Yes Historical Provider, MD  Cholecalciferol (VITAMIN D)  1000 UNITS capsule Take 1,000 Units by mouth daily.    Yes Historical Provider, MD  citalopram (CELEXA) 20 MG tablet Take 20 mg by mouth daily.    Yes Historical Provider, MD  diltiazem (CARDIZEM CD) 180 MG 24 hr capsule Take 180 mg by mouth 2 (two) times daily.   Yes Historical Provider, MD  famotidine (PEPCID) 20 MG tablet Take 20 mg by mouth 2 (two) times daily.   Yes Historical Provider, MD  fluticasone (FLONASE) 50 MCG/ACT nasal spray Place 2 sprays into the nose daily.   Yes Historical Provider, MD  furosemide (LASIX) 20 MG tablet Take 20 mg by mouth 2 (two) times daily. Once or twice daily as needed for swelling   Yes Historical Provider, MD  HYDROcodone-acetaminophen (NORCO/VICODIN) 5-325 MG per tablet Take 1-2 tablets by mouth every 4 (four) hours as needed for moderate pain. 04/06/13  Yes Megan [name], PA-C  hydroxychloroquine (PLAQUENIL) 200 MG tablet Take 200 mg by mouth Twice daily.  04/20/12  Yes Historical Provider, MD  IRON PO Take 1 tablet by mouth every Monday, Wednesday, and Friday.    Yes Historical Provider, MD  Liraglutide (VICTOZA) 18 MG/3ML SOLN Inject 1.8 mg into the skin daily. 1.8 units once daily   Yes Historical Provider, MD  LORazepam (ATIVAN) 0.5 MG tablet Take 0.5 mg by mouth every 8 (eight) hours as needed for anxiety.   Yes Historical Provider, MD  metoprolol tartrate (LOPRESSOR) 25 MG tablet Take 0.5 tablets (12.5 mg total) by mouth 2 (two) times daily. 04/08/13  Yes Mihai Croitoru, MD  Multiple Vitamin (MULTIVITAMIN WITH MINERALS) TABS Take 1 tablet by mouth daily.   Yes Historical Provider, MD  omeprazole (PRILOSEC) 40 MG capsule Take 40 mg by mouth 2 (two) times daily.    Yes Historical Provider, MD  Respiratory Therapy Supplies (FLUTTER) DEVI Inhale 2 application into the lungs every 4 (four) hours.    Yes Historical Provider, MD  rosuvastatin (CRESTOR) 5 MG tablet Take 5 mg by mouth every Monday, Wednesday, and Friday.    Yes Historical Provider, MD  tiotropium  (SPIRIVA) 18 MCG inhalation capsule Place 18 mcg into inhaler and inhale daily.   Yes Historical Provider, MD   Physical Exam: Filed Vitals:   04/26/13 0959  BP: 96/56  Pulse: 114  Temp:   Resp:     Constitutional: Vital signs reviewed.  Patient is a well-developed and well-nourished  in no acute distress and cooperative with exam. Alert and oriented x3.  Head: Normocephalic and atraumatic Mouth: no erythema or exudates, MMM Eyes: PERRL, EOMI, conjunctivae normal, No scleral icterus.  Neck: Supple, Trachea midline normal ROM, No JVD, mass, thyromegaly, or carotid bruit present.  Cardiovascular: RRR, S1 normal, S2 normal, no MRG, pulses symmetric and intact bilaterally Pulmonary/Chest: normal respiratory effort, CTAB, no wheezes, rales, or rhonchi Abdominal: Soft.tenderness int he mid abdomen and left upper quadrant , bowl sounds heard.  Musculoskeletal: No joint deformities, erythema, or stiffness, ROM full and no nontender Neurological: A&O x3, Strength is normal and symmetric bilaterally,  no focal motor deficit, sensory intact to light touch bilaterally.  Skin: Warm, dry and intact. No rash, cyanosis, or clubbing.  Psychiatric: Normal mood and affect. speech and behavior is normal.    Labs on Admission:  Basic Metabolic Panel:  Recent Labs Lab 04/20/13 0543 04/25/13 1830  NA 138 135  K 3.9 3.8  CL 106 97  CO2 21 23  GLUCOSE 70 75  BUN 13 6  CREATININE 1.04 0.78  CALCIUM 9.1 9.7   Liver Function Tests:  Recent Labs Lab 04/20/13 0543 04/25/13 1830  AST 10 15  ALT 6 7  ALKPHOS 73 86  BILITOT 0.3 0.2*  PROT 6.8 7.7  ALBUMIN 2.5* 3.0*    Recent Labs Lab 04/25/13 1830  LIPASE 13   No results found for this basename: AMMONIA,  in the last 168 hours CBC:  Recent Labs Lab 04/20/13 0543 04/25/13 1830  WBC 10.5 15.6*  NEUTROABS  --  11.9*  HGB 8.9* 10.5*  HCT 28.0* 32.2*  MCV 86.7 84.7  PLT 259 335   Cardiac Enzymes: No results found for this  basename: CKTOTAL, CKMB, CKMBINDEX, TROPONINI,  in the last 168 hours  BNP (last 3 results) No results found for this basename: PROBNP,  in the last 8760 hours CBG: No results found for this basename: GLUCAP,  in the last 168 hours  Radiological Exams on Admission: Ct Abdomen Pelvis W Contrast  04/26/2013   ADDENDUM REPORT: 04/26/2013  03:03  ADDENDUM: 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   Electronically Signed   By: Jearld Lesch M.D.   On: 04/26/2013 03:03   04/26/2013   CLINICAL DATA:  Abdominal pain  EXAM: CT ABDOMEN AND PELVIS WITH CONTRAST  TECHNIQUE: Multidetector CT imaging of the abdomen and pelvis was performed using the standard protocol following bolus administration of intravenous contrast.  CONTRAST:  OMNIPAQUE IOHEXOL 300 MG/ML  SOLN  COMPARISON:  04/19/2013  FINDINGS: Lung bases clear.  Normal heart size.  Unremarkable liver, biliary system, spleen, pancreas, adrenal glands. Bilateral too small to further characterize renal hypodensities. Otherwise, symmetric renal enhancement. No hydroureteronephrosis.  No complete bowel obstruction, with contrast reaching the right lower quadrant ostomy. Evidence of partial colectomy. Decrease in size of the fluid collection along the vaginal cuff, though mild complexity and fluid persists. Adjacent small bowel loop is kinked, with proximal dilatation up to 3 cm. And there is a thickened loop within the left lower pelvis on image 56. However, contrast is seen distal to this loop. No free intraperitoneal air or new fluid collection. No lymphadenopathy.  Normal caliber aorta and branch vessels with scattered atherosclerotic disease. Coils within the right upper quadrant.  Partially decompressed bladder. Uterus presumed surgically absent. No adnexal mass.  No acute osseous finding.  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 There is no complete obstruction at this  time however there is delayed transit through a segment of bowel in the right pelvis, which may cause partial or intermittent obstruction.  Fluid collection of indeterminate sterility within the pelvis adjacent to the vaginal cuff, however decreased in size in the interval.  Electronically Signed: By: Jearld Lesch M.D. On: 04/26/2013 02:49    EKG: not done  Assessment/Plan Active Problems:   Abdominal pain   Vomiting   SBO (small bowel obstruction)  1. Nausea, vomiting and abdominal pain: - unclear etiology, probably gastroparesis, vs gastroenteritis.  - stool sent for c diff NEGATIVE.  - will start her on clear liquid diet.  - IV fluids and anti emetics.  - appreciate surgery and GI consultations.  - she is afebrile , but has leukocytosis.   2. Chronic respiratory failure secondary to COPD:  - resume spiriva, pulmicort.  - on nasal oxygen as needed.   3. Type 2 Diabetes Mellitus: -  CBG (last 3)   Recent Labs  04/26/13 1141 04/26/13 1209 04/26/13 1239  GLUCAP 43* 43* 88    Start her on IV dextrose NS , clear liquid diet.  - SSI.   4. Hypertension; controlled.   DVT prophylaxis.   Code Status: full code Family Communication: husband at bedside Disposition Plan: admit to inpatient.   Time spent: 70 min  Millette Halberstam [facility] Pager [phone]  If 7PM-7AM, please contact night-coverage [email or web address] Password [facility] 04/26/2013, 10:16 AM

## 2013-04-26 NOTE — Progress Notes (Signed)
Hypoglycemic Event  CBG: 43  Treatment: 1 tube instant glucose  Symptoms: None  Follow-up CBG: Time:1240 CBG Result:88   Possible Reasons for Event: Inadequate meal intake-NPO  Comments/MD notified: Dr. Blake Divine notified on floor while in patient's room, ordered to start D5NS IV fluids continuously at 50cc/hr.    Ave Filter  Remember to initiate Hypoglycemia Order Set & complete

## 2013-04-26 NOTE — ED Notes (Signed)
CT notified that [name] finished drinking one cup of contrast, not able to drink second cup even nausea med given. Dr. Piedad Climes states to run CT scan.

## 2013-04-26 NOTE — ED Notes (Signed)
Stool sample collected from colostomy bag.

## 2013-04-26 NOTE — Consult Note (Signed)
Referring Provider: Dr. Cicero Duck Primary Care Physician:  [name], MD Primary Gastroenterologist:  Dr. Danise Edge   Reason for Consultation:  Vomiting and food intolerance  HPI: [name] is a 62 y.o. female been admitted through the emergency room today because of a several day history of inability to keep food on her stomach.  The patient has a complicated history. She has a long-standing history of Crohn's ileocolitis, for which she underwent a subtotal colectomy with Hartman's pouch and ileostomy at [facility] in 2007.  Her Crohn's disease has been quiescent, and she was asymptomatic until the abrupt onset of pain, without vomiting, in the middle of the night approximately 5 weeks ago, at which time she radiographic evidence of a small bowel obstruction and underwent operative lysis of adhesions, as well as mesh placement and repair of several abdominal wall hernias.  She was readmitted a couple of weeks thereafter with recurrent pain, and this time found to have a pelvic abscess, which required surgical drainage as well as a percutaneous drainage of an adjacent loculation.  She went home from that surgery about a week ago, doing fair. She did not have much of an appetite, but seemed to be able to eat. Then, several days ago, she began to have food intolerance, or anything she would eat, or even drink,, with more less effortless vomiting, with the food having been undigested appearance. This would occur somewhere between 30 and 120 minutes postprandially. There is no associated antecedent nausea or abdominal pain. This symptom is entirely different than the follow obstruction or the abscess with which she presented recently.  She has not seen any blood in her emesis or in her ileostomy output.  The patient is not on any medications for Crohn's disease.  Her CT in the emergency room today, which I reviewed at length with the radiologist, showed excellent and complete  exiting of the oral contrast from the stomach and good progress into the ileostomy pouch, thereby excluding any high-grade gastric outlet obstruction or small bowel obstruction. There was no radiographic evidence of Crohn's activity or recurrent abscess. The distal portion of the small bowel is somewhat dilated compared to the proximal portion, again not really suggestive of a bowel obstruction but I wonder about perhaps a localized ileus.  The patient indicates she has only been using small amounts of narcotic pain medication at home, not on a daily basis, "only when she really needs it."  The patient is on Celebrex daily as well as an 81 mg aspirin 3 times a week, but according to her home medication list (the patient herself is somewhat unclear on her medications), she is on omeprazole 40 mg twice daily plus famotidine 20 mg twice daily, making ulcer disease somewhat less likely.   She has not really had dyspeptic symptomatology. She does not have reflux or regurgitation symptoms apart from occasional heartburn. There has been dysphagia in the past, but none recently.   She has not noticed any abdominal distention or change in her ostomy output in association with her nausea and vomiting to suggest recurrent bowel obstruction.  The patient had endoscopy by Dr. Laural Benes approximately a year ago, at which time she was probably on her aspirin but not necessarily her Celebrex (I am unable to find medication list pertaining to that endoscopy unit encounter).   Past Medical History  Diagnosis Date  . Other diseases of vocal cords   . Osteoporosis, unspecified   . Other and unspecified hyperlipidemia   .  Esophageal reflux   . Chronic airway obstruction, not elsewhere classified   . Regional enteritis of unspecified site   . 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 Crohn's disease     Ileostomy  . Pelvic adhesions   . Anxiety   . Depression   . Hypertension   . Emphysema   .  Hematuria   . Severe cervical dysplasia age 2    cone biopsy with neg paps since  . Pneumonia     "3-4 times; necrotizing once" (04/19/2013)  . Asthmatic bronchitis , chronic   . Exertional shortness of breath   . On home oxygen therapy     "24/7; 2.5L @ rest; 3L when I'm doing my chores" (04/19/2013)  . Type II diabetes mellitus   . Anemia   . History of blood transfusion     "I've had 3 or 4" (04/19/2013)  . Rheumatoid arthritis(714.0) 03/2012    [place]; "mild in my hands" (04/19/2013)    Past Surgical History  Procedure Laterality Date  . Ileostomy  2007  . Ear cyst excision Bilateral 1970's    "lobes"   . Cervical cone biopsy  1978  . Colon surgery  2007    subtotal colectomy with ileostomy  . Esophagogastroduodenoscopy (egd) with esophageal dilation      "3 times, I think" (04/19/2013)  . Oophorectomy      LSO  . Tracheostomy  2010  . Hammer toe surgery Bilateral 1980's  . Balloon dilation  02/10/2012    Procedure: BALLOON DILATION;  Surgeon: Charolett Bumpers, MD;  Location: [facility] ENDOSCOPY;  Service: Endoscopy;  Laterality: N/A;  . Colposcopy    . Flexible sigmoidoscopy  03/26/2012    Procedure: FLEXIBLE SIGMOIDOSCOPY;  Surgeon: Charolett Bumpers, MD;  Location: [facility] ENDOSCOPY;  Service: Endoscopy;  Laterality: N/A;  . Cardiac catheterization  07/08/2007    normal  . Nm myocar perf wall motion  09/21/2006    no ischemia  . Ventral hernia repair N/A 03/16/2013    Procedure: REPAIR OF INCISIONAL AND PARASTOMAL HERNIAS;  Surgeon: Mariella Saa, MD;  Location: [facility] OR;  Service: General;  Laterality: N/A;  . Laparotomy N/A 03/16/2013    Procedure: EXPLORATORY LAPAROTOMY;  Surgeon: Mariella Saa, MD;  Location: [facility] OR;  Service: General;  Laterality: N/A;  . Lysis of adhesion N/A 03/16/2013    Procedure: LYSIS OF ADHESIONS FOR SMALL BOWEL OBSTRUCTION;  Surgeon: Mariella Saa, MD;  Location: [facility] OR;  Service: General;  Laterality: N/A;  . Insertion of mesh N/A  03/16/2013    Procedure: INSERTION OF BIOLOGIC MESH;  Surgeon: Mariella Saa, MD;  Location: [facility] OR;  Service: General;  Laterality: N/A;  . Tee without cardioversion N/A 03/21/2013    Procedure: TRANSESOPHAGEAL ECHOCARDIOGRAM (TEE);  Surgeon: Vesta Mixer, MD;  Location: [facility] ENDOSCOPY;  Service: Cardiovascular;  Laterality: N/A;  . Vaginal hysterectomy  1993    [facility]-LSO    Prior to Admission medications   Medication Sig Start Date End Date Taking? Authorizing Provider  amoxicillin-clavulanate (AUGMENTIN) 875-125 MG per tablet Take 1 tablet by mouth 2 (two) times daily. 04/20/13  Yes Letha Cape, PA-C  arformoterol (BROVANA) 15 MCG/2ML NEBU Take 15 mcg by nebulization 2 (two) times daily.   Yes Historical Provider, MD  aspirin 81 MG tablet Take 81 mg by mouth every Monday, Wednesday, and Friday.    Yes Historical Provider, MD  budesonide (PULMICORT) 0.25 MG/2ML nebulizer solution Take 0.25 mg by nebulization 2 (  two) times daily.   Yes Historical Provider, MD  celecoxib (CELEBREX) 200 MG capsule Take 200 mg by mouth daily.   Yes Historical Provider, MD  Cholecalciferol (VITAMIN D) 1000 UNITS capsule Take 1,000 Units by mouth daily.    Yes Historical Provider, MD  citalopram (CELEXA) 20 MG tablet Take 20 mg by mouth daily.    Yes Historical Provider, MD  diltiazem (CARDIZEM CD) 180 MG 24 hr capsule Take 180 mg by mouth 2 (two) times daily.   Yes Historical Provider, MD  famotidine (PEPCID) 20 MG tablet Take 20 mg by mouth 2 (two) times daily.   Yes Historical Provider, MD  fluticasone (FLONASE) 50 MCG/ACT nasal spray Place 2 sprays into the nose daily.   Yes Historical Provider, MD  furosemide (LASIX) 20 MG tablet Take 20 mg by mouth 2 (two) times daily. Once or twice daily as needed for swelling   Yes Historical Provider, MD  HYDROcodone-acetaminophen (NORCO/VICODIN) 5-325 MG per tablet Take 1-2 tablets by mouth every 4 (four) hours as needed for moderate pain. 04/06/13  Yes Megan  [name], PA-C  hydroxychloroquine (PLAQUENIL) 200 MG tablet Take 200 mg by mouth Twice daily.  04/20/12  Yes Historical Provider, MD  IRON PO Take 1 tablet by mouth every Monday, Wednesday, and Friday.    Yes Historical Provider, MD  Liraglutide (VICTOZA) 18 MG/3ML SOLN Inject 1.8 mg into the skin daily. 1.8 units once daily   Yes Historical Provider, MD  LORazepam (ATIVAN) 0.5 MG tablet Take 0.5 mg by mouth every 8 (eight) hours as needed for anxiety.   Yes Historical Provider, MD  metoprolol tartrate (LOPRESSOR) 25 MG tablet Take 0.5 tablets (12.5 mg total) by mouth 2 (two) times daily. 04/08/13  Yes Mihai Croitoru, MD  Multiple Vitamin (MULTIVITAMIN WITH MINERALS) TABS Take 1 tablet by mouth daily.   Yes Historical Provider, MD  omeprazole (PRILOSEC) 40 MG capsule Take 40 mg by mouth 2 (two) times daily.    Yes Historical Provider, MD  Respiratory Therapy Supplies (FLUTTER) DEVI Inhale 2 application into the lungs every 4 (four) hours.    Yes Historical Provider, MD  rosuvastatin (CRESTOR) 5 MG tablet Take 5 mg by mouth every Monday, Wednesday, and Friday.    Yes Historical Provider, MD  tiotropium (SPIRIVA) 18 MCG inhalation capsule Place 18 mcg into inhaler and inhale daily.   Yes Historical Provider, MD    Current Facility-Administered Medications  Medication Dose Route Frequency Provider Last Rate Last Dose  . arformoterol (BROVANA) nebulizer solution 15 mcg  15 mcg Nebulization BID Kathlen Mody, MD      . budesonide (PULMICORT) nebulizer solution 0.25 mg  0.25 mg Nebulization BID Kathlen Mody, MD      . dextrose (GLUTOSE) 40 % oral gel           . dextrose 5 %-0.9 % sodium chloride infusion   Intravenous Continuous Kathlen Mody, MD      . Melene Muller ON 04/27/2013] diltiazem (CARDIZEM CD) 24 hr capsule 180 mg  180 mg Oral BID Kathlen Mody, MD      . enoxaparin (LOVENOX) injection 40 mg  40 mg Subcutaneous Q24H Kathlen Mody, MD      . fluticasone (FLONASE) 50 MCG/ACT nasal spray 2 spray  2 spray  Each Nare Daily Kathlen Mody, MD      . HYDROmorphone (DILAUDID) injection 1 mg  1 mg Intravenous Once Arloa Koh, MD      . hydroxychloroquine (PLAQUENIL) tablet 200 mg  200 mg Oral  BID Kathlen Mody, MD      . insulin aspart (novoLOG) injection 0-9 Units  0-9 Units Subcutaneous TID WC Kathlen Mody, MD      . Melene Muller ON 04/27/2013] metoprolol tartrate (LOPRESSOR) tablet 12.5 mg  12.5 mg Oral BID Kathlen Mody, MD      . ondansetron (ZOFRAN) tablet 4 mg  4 mg Oral Q6H PRN Kathlen Mody, MD       Or  . ondansetron (ZOFRAN) injection 4 mg  4 mg Intravenous Q6H PRN Kathlen Mody, MD      . promethazine (PHENERGAN) injection 12.5 mg  12.5 mg Intravenous Q6H PRN Kathlen Mody, MD      . sodium chloride 0.9 % injection 3 mL  3 mL Intravenous Q12H Kathlen Mody, MD      . tiotropium (SPIRIVA) inhalation capsule 18 mcg  18 mcg Inhalation Daily Kathlen Mody, MD        Allergies as of 04/25/2013 - Review Complete 04/25/2013  Allergen Reaction Noted  . Esomeprazole magnesium Other (See Comments)   . Shellfish allergy Shortness Of Breath and Nausea And Vomiting 02/06/2011  . Surgical lubricant Other (See Comments) 02/08/2013  . Other Nausea And Vomiting 12/01/2011  . Peanut-containing drug products Nausea And Vomiting 02/06/2011  . Levaquin [levofloxacin]  04/25/2013    Family History  Problem Relation Age of Onset  . Osteoporosis Mother   . Kidney disease Mother   . Hypertension Sister   . Stroke Sister   . Diabetes Sister     History   Social History  . Marital Status: Divorced    Spouse Name: N/A    Number of Children: N/A  . Years of Education: N/A   Occupational History  . Not on file.   Social History Main Topics  . Smoking status: Former Smoker -- 1.00 packs/day for 32 years    Types: Cigarettes    Quit date: 05/26/2004  . Smokeless tobacco: Never Used  . Alcohol Use: 0.0 oz/week     Comment: 04/19/2013 "have a drink a couple times/yr"  . Drug Use: No  . Sexual Activity: Not  Currently    Birth Control/ Protection: Surgical   Other Topics Concern  . Not on file   Social History Narrative  . No narrative on file    Review of Systems: Please see history of present illness for her GI review of systems. Notably, the patient is on chronic home oxygen, but with the help of oxygen, is fairly normally ambulatory, able to do light activities such as getting around the house, doing her own shopping, etc. Physical Exam: Vital signs in last 24 hours: Temp:  [98.3 F (36.8 C)-99 F (37.2 C)] 98.6 F (37 C) (12/02 1143) Pulse Rate:  [102-125] 119 (12/02 1143) Resp:  [16-24] 16 (12/02 1143) BP: (86-117)/(45-91) 111/45 mmHg (12/02 1143) SpO2:  [94 %-100 %] 97 % (12/02 1143) Weight:  [50.032 kg (110 lb 4.8 oz)] 50.032 kg (110 lb 4.8 oz) (12/02 1143)   General:   Alert, thin, pleasant and cooperative in NAD, appears perhaps slightly anxious, very articulate Head:  Normocephalic and atraumatic. Eyes:  Sclera clear, no icterus.   Conjunctiva pink. Lungs:  No respiratory distress on nasal cannula O2, at rest. Lung sounds markedly diminished bilaterally, consistent with COPD. No wheezing heard at this time. No use of accessory muscles of respiration. Heart:   Regular rate and rhythm; no murmurs, clicks, rubs,  or gallops. Abdomen: Ileostomy in right lower quadrant. No obvious ventral hernias.  Nondistended, quiet bowel sounds, somewhat nonreproducible subjective tenderness and left lower quadrant, without mass effect, or peritoneal findings. There does appear to be some voluntary guarding. There is also exquisite tenderness to palpation of the left lower rib cage laterally, although the patient is able to lie on her left side without difficulty. She is able to sit up without any evident discomfort. Rectal:  No anal stenosis or evident perianal disease other than some small skin tags. No fistulae   seen. Exquisite tenderness to insertion of even my little finger, which she says  hurts up inside rather than in the anal area itself.  Msk:   Symmetrical without gross deformities. Extremities:   Without clubbing, cyanosis, or edema. Neurologic:  Alert and coherent;  grossly normal neurologically. Skin:  Intact without significant lesions or rashes. Psych:   Alert and cooperative.  Comes across as somewhat anxious.  Intake/Output from previous day:   Intake/Output this shift: Total I/O In: -  Out: 100 [Urine:100]  Lab Results:  Recent Labs  04/25/13 1830  WBC 15.6*  HGB 10.5*  HCT 32.2*  PLT 335   BMET  Recent Labs  04/25/13 1830  NA 135  K 3.8  CL 97  CO2 23  GLUCOSE 75  BUN 6  CREATININE 0.78  CALCIUM 9.7   LFT  Recent Labs  04/25/13 1830  PROT 7.7  ALBUMIN 3.0*  AST 15  ALT 7  ALKPHOS 86  BILITOT 0.2*   PT/INR No results found for this basename: LABPROT, INR,  in the last 72 hours  C-Diff pending   Studies/Results: Ct Abdomen Pelvis W Contrast  04/26/2013   ADDENDUM REPORT: 04/26/2013 03:03  ADDENDUM: 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   Electronically Signed   By: Jearld Lesch M.D.   On: 04/26/2013 03:03   04/26/2013   CLINICAL DATA:  Abdominal pain  EXAM: CT ABDOMEN AND PELVIS WITH CONTRAST  TECHNIQUE: Multidetector CT imaging of the abdomen and pelvis was performed using the standard protocol following bolus administration of intravenous contrast.  CONTRAST:  OMNIPAQUE IOHEXOL 300 MG/ML  SOLN  COMPARISON:  04/19/2013  FINDINGS: Lung bases clear.  Normal heart size.  Unremarkable liver, biliary system, spleen, pancreas, adrenal glands. Bilateral too small to further characterize renal hypodensities. Otherwise, symmetric renal enhancement. No hydroureteronephrosis.  No complete bowel obstruction, with contrast reaching the right lower quadrant ostomy. Evidence of partial colectomy. Decrease in size of the fluid collection along the vaginal cuff, though mild complexity and  fluid persists. Adjacent small bowel loop is kinked, with proximal dilatation up to 3 cm. And there is a thickened loop within the left lower pelvis on image 56. However, contrast is seen distal to this loop. No free intraperitoneal air or new fluid collection. No lymphadenopathy.  Normal caliber aorta and branch vessels with scattered atherosclerotic disease. Coils within the right upper quadrant.  Partially decompressed bladder. Uterus presumed surgically absent. No adnexal mass.  No acute osseous finding.  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 There is no complete obstruction at this time however there is delayed transit through a segment of bowel in the right pelvis, which may cause partial or intermittent obstruction.  Fluid collection of indeterminate sterility within the pelvis adjacent to the vaginal cuff, however decreased in size in the interval.  Electronically Signed: By: Jearld Lesch M.D. On: 04/26/2013 02:49   Dg Chest Port 1 View  04/26/2013   CLINICAL  DATA:  Weakness, COPD  EXAM: PORTABLE CHEST - 1 VIEW  COMPARISON:  03/27/2013  FINDINGS: Cardiomediastinal silhouette is stable. No acute infiltrate or pulmonary edema. Extensive degenerative changes right shoulder again noted. Stable bilateral atelectasis or scarring.  IMPRESSION: No active disease. Extensive degenerative changes right shoulder again noted.   Electronically Signed   By: Natasha Mead M.D.   On: 04/26/2013 10:41    Impression:  1. Food intolerance with postprandial vomiting. Based on the patient's history and the radiographic findings, it appears that this is a functional problem associated with gastric emptying, rather than an anatomic gastric outlet obstruction or small bowel obstruction.  2. History of Crohn's disease, without current radiographic evidence of activity, not on medication, status post ileostomy with subtotal colectomy and Hartman's pouch  3. Status post small bowel  obstruction about 5 weeks ago, with operative lysis of adhesions and abdominal wall hernia repair, complicated by subsequent pelvic abscess requiring recurrent surgery for drainage as well as percutaneous drainage  Plan:  1. Treat as for gastroparesis, with frequent small aliquots of liquids in gradually increasing amounts 2. If this is not tolerated, consider institution of metoclopramide 3. If that is not tolerated, consider updated endoscopic evaluation. I am somewhat reluctant to do nonessential endoscopic evaluation in this patient with severe COPD, although it sounds as though she did okay last year and there has been no significant interval change in her pulmonary status. 4. Minimize narcotic analgesics which have an inhibitory effect on gastric motility 5. Consider gastric emptying scan if her clinical evolution is not consistent with resolving gastroparesis   LOS: 1 day   Brandyn Thien V  04/26/2013, 12:28 PM

## 2013-04-26 NOTE — ED Provider Notes (Signed)
8:52 AM  Care transferred from Dr. Ranae Palms on 62 y.o.with recent SBO, intraabdominal abscess formation who presented w/ n/v, inability to tolerate PO and pain around ileostomy. CT w/ delayed transit through protion of small bowl (possible intermittent or partial obstruction), small appearance of intraabdominal fluid collection.  After prolonged ED stay, gen surg has seen pt, does not believe there is acute surgical issue.  Pt has been persistently tachycardic & unable to tolerate PO, vomiting 1-2 hours after intake. She believes this may be due to recurrent esophageal stricture.  [facility] consulted, will admit and will consult her GI specialist, Dr. Charisse March w/ [facility].    1. Leukocytosis   2. Abdominal pain   3. Vomiting   4. Crohn's disease      Shanna Cisco, MD 04/26/13 [phone]

## 2013-04-26 NOTE — ED Notes (Signed)
MD at bedside. 

## 2013-04-26 NOTE — Consult Note (Signed)
Agree with A&P of ER,NP. Patient seen late afternoon and feeling much better. Reviewed Dr Buccini's note. Does not appear to have a surgical problem.  Will follow

## 2013-04-27 ENCOUNTER — Inpatient Hospital Stay (HOSPITAL_COMMUNITY): Payer: Medicare Other

## 2013-04-27 DIAGNOSIS — D72829 Elevated white blood cell count, unspecified: Secondary | ICD-10-CM

## 2013-04-27 LAB — URINE MICROSCOPIC-ADD ON

## 2013-04-27 LAB — GLUCOSE, CAPILLARY
Glucose-Capillary: 89 mg/dL (ref 70–99)
Glucose-Capillary: 90 mg/dL (ref 70–99)

## 2013-04-27 LAB — BASIC METABOLIC PANEL
BUN: 5 mg/dL — ABNORMAL LOW (ref 6–23)
CO2: 24 mEq/L (ref 19–32)
Creatinine, Ser: 0.83 mg/dL (ref 0.50–1.10)
Glucose, Bld: 83 mg/dL (ref 70–99)
Potassium: 3.5 mEq/L (ref 3.5–5.1)
Sodium: 140 mEq/L (ref 135–145)

## 2013-04-27 LAB — CBC
Hemoglobin: 8.8 g/dL — ABNORMAL LOW (ref 12.0–15.0)
MCH: 27.8 pg (ref 26.0–34.0)
MCV: 84.2 fL (ref 78.0–100.0)
RBC: 3.17 MIL/uL — ABNORMAL LOW (ref 3.87–5.11)

## 2013-04-27 LAB — URINALYSIS, ROUTINE W REFLEX MICROSCOPIC
Bilirubin Urine: NEGATIVE
Glucose, UA: NEGATIVE mg/dL
Ketones, ur: 15 mg/dL — AB
Protein, ur: 30 mg/dL — AB
Urobilinogen, UA: 0.2 mg/dL (ref 0.0–1.0)

## 2013-04-27 MED ORDER — CITALOPRAM HYDROBROMIDE 20 MG PO TABS
20.0000 mg | ORAL_TABLET | Freq: Every day | ORAL | Status: DC
  Administered 2013-04-27 – 2013-04-29 (×3): 20 mg via ORAL
  Filled 2013-04-27 (×3): qty 1

## 2013-04-27 MED ORDER — SODIUM CHLORIDE 0.9 % IV SOLN: INTRAVENOUS | Status: DC

## 2013-04-27 MED ORDER — LORAZEPAM 0.5 MG PO TABS
0.5000 mg | ORAL_TABLET | Freq: Three times a day (TID) | ORAL | Status: DC | PRN
Start: 2013-04-27 — End: 2013-04-29
  Administered 2013-04-27: 0.5 mg via ORAL
  Filled 2013-04-27: qty 1

## 2013-04-27 NOTE — Progress Notes (Signed)
Agree with A&P of MD,PA. She does not have surgical issues that I can see. We will see again PRN

## 2013-04-27 NOTE — Progress Notes (Signed)
Feels well. No nausea or vomiting. Has tolerated advancement of clear liquids to 60 MLS every 30 minutes.  Exam: Smiling, lying in bed, in no acute distress, bowel sounds present, slight tympany present in left lower quadrant. Still somewhat subjectively "jumpy" and subjectively tender to palpation of left side of abdomen. Left-sided rib cage tenderness much improved compared to yesterday.   Impr:  Improving--?gastroparesis  Plan:  Continue dietary advancement. Hold off on metoclopramide. Hold off on endoscopy. I hope that the patient will be on larger, less frequent boluses of liquids tomorrow morning, and if that is tolerated, on simple solids by tomorrow evening.  Possibly home Friday if continues to do well.  [name], M.D. [phone]

## 2013-04-27 NOTE — Progress Notes (Signed)
[facility]                                                                                Patient Demographics  [name], is a 62 y.o. female, DOB - 11/06/1950, ZOX:[id number]  Admit date - 04/25/2013   Admitting Physician Kathlen Mody, MD  Outpatient Primary MD for the patient is [name], MD  LOS - 2   Chief Complaint  Patient presents with  . Emesis  . Abdominal Pain        Assessment & Plan    1. Abdominal pain with postprandial nausea and vomiting suggestive of gastroenteritis versus gastroparesis. No CT evidence of Crohn's colitis exacerbation or intra-abdominal abscess.- Being seen by GI and general surgery, per GI this is likely functional, her C. difficile panel is negative, she is now tolerating clear liquid diet and if his clinically much improved. We'll continue to advance diet as guided by GI. If stable will discharge home in 24-48 hours.   2.  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 Will monitor with GI as in #1 above.    3. History of COPD. No acute issues, uses oxygen when relating, nebulizer treatments as needed.    4. Dyslipidemia resume home dose statin.    5. Hypertension. Stable resume home medications.    6. Diabetes mellitus type 2 mentioned in her chart - A1c is 5.6, stop sliding scale.   Lab Results  Component Value Date   HGBA1C 5.6 03/16/2013    CBG (last 3)   Recent Labs  04/26/13 1701 04/26/13 2115 04/27/13 0619  GLUCAP 153* 85 89      Code Status: Full  Family  Communication: None present  Disposition Plan: Home   Procedures CT abdomen pelvis   Consults GI, general surgery   Medications  Scheduled Meds: . arformoterol  15 mcg Nebulization BID  . budesonide  0.25 mg Nebulization BID  . diltiazem  180 mg Oral BID  . enoxaparin (LOVENOX) injection  40 mg Subcutaneous Q24H  . fluticasone  2 spray Each Nare Daily  .  HYDROmorphone (DILAUDID) injection  1 mg Intravenous Once  . hydroxychloroquine  200 mg Oral BID  . insulin aspart  0-9 Units Subcutaneous TID WC  . metoprolol tartrate  12.5 mg Oral BID  . sodium chloride  3 mL Intravenous Q12H  . tiotropium  18 mcg Inhalation Daily   Continuous Infusions: . dextrose 5 % and 0.9% NaCl 50 mL/hr at 04/27/13 0640   PRN Meds:.ondansetron (ZOFRAN) IV, ondansetron, promethazine  DVT Prophylaxis  Lovenox  Lab Results  Component Value Date   PLT 300 04/27/2013    Antibiotics    Anti-infectives   Start     Dose/Rate Route Frequency Ordered Stop   04/26/13 1300  hydroxychloroquine (PLAQUENIL) tablet 200 mg  200 mg Oral 2 times daily 04/26/13 1152            Subjective:   [name] today has, No headache, No chest pain, improved abdominal pain - No Nausea and tolerating clear liquid diet, No new weakness tingling or numbness, No Cough - SOB.   Objective:   Filed Vitals:   04/26/13 2103 04/26/13 2124 04/27/13 0455 04/27/13 1022  BP: 115/51  113/64   Pulse: 101  106   Temp: 99.1 F (37.3 C)  98.7 F (37.1 C)   TempSrc: Oral  Oral   Resp: 18  18   Height:      Weight:      SpO2: 100% 100% 99% 100%    Wt Readings from Last 3 Encounters:  04/26/13 50.032 kg (110 lb 4.8 oz)  04/19/13 49.5 kg (109 lb 2 oz)  04/11/13 49.442 kg (109 lb)     Intake/Output Summary (Last 24 hours) at 04/27/13 1120 Last data filed at 04/27/13 0730  Gross per 24 hour  Intake   1560 ml  Output    100 ml  Net   1460 ml    Exam Awake Alert, Oriented X 3, No new F.N deficits,  Normal affect [place].AT,PERRAL Supple Neck,No JVD, No cervical lymphadenopathy appriciated.  Symmetrical Chest wall movement, Good air movement bilaterally, CTAB RRR,No Gallops,Rubs or new Murmurs, No Parasternal Heave +ve B.Sounds, Abd Soft, some generalized tenderness, No organomegaly appriciated, No rebound - guarding or rigidity. Ileostomy bag in place stable No Cyanosis, Clubbing or edema, No new Rash or bruise    Data Review   Micro Results Recent Results (from the past 240 hour(s))  CULTURE, BLOOD (ROUTINE X 2)     Status: None   Collection Time    04/19/13  9:10 AM      Result Value Range Status   Specimen Description BLOOD RIGHT ANTECUBITAL   Final   Special Requests BOTTLES DRAWN AEROBIC ONLY 10CC   Final   Culture  Setup Time     Final   Value: 04/19/2013 14:00     Performed at [facility]   Culture     Final   Value: NO GROWTH 5 DAYS     Performed at [facility]   Report Status 04/25/2013 FINAL   Final  CULTURE, BLOOD (ROUTINE X 2)     Status: None   Collection Time    04/19/13  9:15 AM      Result Value Range Status   Specimen Description BLOOD ARM RIGHT   Final   Special Requests BOTTLES DRAWN AEROBIC ONLY 10CC   Final   Culture  Setup Time     Final   Value: 04/19/2013 14:00     Performed at [facility]   Culture     Final   Value: NO GROWTH 5 DAYS     Performed at [facility]   Report Status 04/25/2013 FINAL   Final  CLOSTRIDIUM DIFFICILE BY PCR     Status: None   Collection Time    04/26/13  9:05 AM      Result Value Range Status   C difficile by pcr NEGATIVE  NEGATIVE Final    Radiology Reports Dg Abd 1 View  04/27/2013   CLINICAL DATA:  Small bowel obstruction.  EXAM: ABDOMEN - 1 VIEW  COMPARISON:  04/26/2013  FINDINGS: Right-sided ostomy noted.  Coils project over the upper abdomen.  No dilated bowel. Much of the right-sided abdominal  bowel is gasless.  Sclerosis in both femoral heads compatible with avascular  necrosis.  Linear retrocardiac density is present.  IMPRESSION: 1. No currently dilated bowel to suggest obstruction. A considerable amount of the right-sided bowel is gasless which can sometimes hide bowel pathology. 2. Bilateral hip avascular necrosis. 3. Subsegmental atelectasis in the left lower lobe.   Electronically Signed   By: Herbie Baltimore M.D.   On: 04/27/2013 10:02   Ct Angio Chest Pe W/cm &/or Wo Cm  04/26/2013   CLINICAL DATA:  Chest pain, shortness of breath and abdominal pain.  EXAM: CT ANGIOGRAPHY CHEST  CT ABDOMEN AND PELVIS WITH CONTRAST  TECHNIQUE: Multidetector CT imaging of the chest was performed using the standard protocol during bolus administration of intravenous contrast. Multiplanar CT image reconstructions including MIPs were obtained to evaluate the vascular anatomy. Multidetector CT imaging of the abdomen and pelvis was performed using the standard protocol during bolus administration of intravenous contrast.  CONTRAST:  100 mL OMNIPAQUE IOHEXOL 350 MG/ML SOLN  COMPARISON:  CT abdomen and pelvis 04/03/2013, 03/27/2013 and 03/14/2013. CT chest 12/01/2011.  FINDINGS: CTA CHEST FINDINGS  No pulmonary embolus is identified. Heart size is normal. No pleural or pericardial effusion. No axillary, hilar or mediastinal lymphadenopathy. The lungs demonstrate extensive emphysematous change. There is some scarring in the right upper lobe. The lungs are otherwise clear.  CT ABDOMEN and PELVIS FINDINGS  Again seen and is postoperative change of colectomy with a Hartmann's pouch and right lower quadrant ileostomy. Pigtail catheter in the pelvis seen on the prior examination has been removed. There is a small fluid collection at the former site of the catheter measuring 1.7 cm transverse by 4.1 cm AP by 1.6 cm craniocaudal. Very small subcutaneous fluid collection to the right of midline seen on the most recent examination has resolved.  There is a single mildly dilated loop of small bowel in the  right hemipelvis with fecalized contents. This loop measures 2.8 cm in diameter and may be dilated secondary to focal ileus or partial obstruction possibly related to adhesions or surgical anastomosis in the right hemipelvis. Distal small bowel loops are completely decompressed.  The liver, gallbladder, spleen and adrenal glands are unremarkable. Small renal cysts are noted. There is no lymphadenopathy. 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  Review of the MIP images confirms the above findings.  IMPRESSION: Status post removal of pigtail catheter from the pelvis. There is a small, new fluid collection in the pelvis measuring 1.7 cm transverse by 4.1 cm AP x 1.6 cm craniocaudal which may represent recurrent abscess.  Mildly dilated loop of small bowel in the pelvis could be due to focal ileus or partial obstruction secondary to adhesions or stricture at a surgical anastomosis.  Very small subcutaneous fluid collection to the right of midline seen on the most recent examination has resolved.  Negative for pulmonary embolus or acute cardiopulmonary disease.  Emphysema.  Avascular necrosis of the femoral heads, worse on the left.   Electronically Signed   By: Drusilla Kanner M.D.   On: 04/26/2013 11:59   Ct Abdomen Pelvis W Contrast  04/26/2013   ADDENDUM REPORT: 04/26/2013 03:03  ADDENDUM: 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   Electronically Signed   By: Jearld Lesch M.D.   On: 04/26/2013 03:03   04/26/2013   CLINICAL DATA:  Abdominal pain  EXAM: CT ABDOMEN AND PELVIS WITH CONTRAST  TECHNIQUE:  Multidetector CT imaging of the abdomen and pelvis was performed using the standard protocol following bolus administration of intravenous contrast.  CONTRAST:  OMNIPAQUE IOHEXOL 300 MG/ML  SOLN  COMPARISON:  04/19/2013  FINDINGS: Lung bases clear.  Normal heart size.  Unremarkable liver, biliary system, spleen,  pancreas, adrenal glands. Bilateral too small to further characterize renal hypodensities. Otherwise, symmetric renal enhancement. No hydroureteronephrosis.  No complete bowel obstruction, with contrast reaching the right lower quadrant ostomy. Evidence of partial colectomy. Decrease in size of the fluid collection along the vaginal cuff, though mild complexity and fluid persists. Adjacent small bowel loop is kinked, with proximal dilatation up to 3 cm. And there is a thickened loop within the left lower pelvis on image 56. However, contrast is seen distal to this loop. No free intraperitoneal air or new fluid collection. No lymphadenopathy.  Normal caliber aorta and branch vessels with scattered atherosclerotic disease. Coils within the right upper quadrant.  Partially decompressed bladder. Uterus presumed surgically absent. No adnexal mass.  No acute osseous finding.  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 There is no complete obstruction at this time however there is delayed transit through a segment of bowel in the right pelvis, which may cause partial or intermittent obstruction.  Fluid collection of indeterminate sterility within the pelvis adjacent to the vaginal cuff, however decreased in size in the interval.  Electronically Signed: By: Jearld Lesch M.D. On: 04/26/2013 02:49  Dg Chest Port 1 View  04/26/2013   CLINICAL DATA:  Weakness, COPD  EXAM: PORTABLE CHEST - 1 VIEW  COMPARISON:  03/27/2013  FINDINGS: Cardiomediastinal silhouette is stable. No acute infiltrate or pulmonary edema. Extensive degenerative changes right shoulder again noted. Stable bilateral atelectasis or scarring.  IMPRESSION: No active disease. Extensive degenerative changes right shoulder again noted.   Electronically Signed   By: Natasha Mead M.D.   On: 04/26/2013 10:41    CBC  Recent Labs Lab 04/25/13 1830 04/26/13 1345 04/27/13 0520  WBC 15.6* 13.0* 12.6*  HGB 10.5* 9.5* 8.8*   HCT 32.2* 29.2* 26.7*  PLT 335 296 300  MCV 84.7 84.6 84.2  MCH 27.6 27.5 27.8  MCHC 32.6 32.5 33.0  RDW 14.0 14.6 14.3  LYMPHSABS 2.4  --   --   MONOABS 1.0  --   --   EOSABS 0.3  --   --   BASOSABS 0.0  --   --     Chemistries   Recent Labs Lab 04/25/13 1830 04/26/13 1345 04/27/13 0520  NA 135  --  140  K 3.8  --  3.5  CL 97  --  107  CO2 23  --  24  GLUCOSE 75  --  83  BUN 6  --  5*  CREATININE 0.78 0.84 0.83  CALCIUM 9.7  --  9.2  AST 15  --   --   ALT 7  --   --   ALKPHOS 86  --   --   BILITOT 0.2*  --   --    ------------------------------------------------------------------------------------------------------------------ estimated creatinine clearance is 52.4 ml/min (by C-G formula based on Cr of 0.83). ------------------------------------------------------------------------------------------------------------------ No results found for this basename: HGBA1C,  in the last 72 hours ------------------------------------------------------------------------------------------------------------------ No results found for this basename: CHOL, HDL, LDLCALC, TRIG, CHOLHDL, LDLDIRECT,  in the last 72 hours ------------------------------------------------------------------------------------------------------------------ No results found for this basename: TSH, T4TOTAL, FREET3, T3FREE, THYROIDAB,  in the last 72 hours ------------------------------------------------------------------------------------------------------------------ No results found for this basename:  VITAMINB12, FOLATE, FERRITIN, TIBC, IRON, RETICCTPCT,  in the last 72 hours  Coagulation profile No results found for this basename: INR, PROTIME,  in the last 168 hours  No results found for this basename: DDIMER,  in the last 72 hours  Cardiac Enzymes No results found for this basename: CK, CKMB, TROPONINI, MYOGLOBIN,  in the last 168  hours ------------------------------------------------------------------------------------------------------------------ No components found with this basename: POCBNP,      Time Spent in minutes   35   SINGH,PRASHANT K M.D on 04/27/2013 at 11:20 AM  Between 7am to 7pm - Pager - [phone]  After 7pm go to [email or web address] - password [email or web address]  And look for the night coverage person covering for me after hours  [facility] Office  [phone]

## 2013-04-27 NOTE — Progress Notes (Signed)
Subjective: Pt doing better.  Tolerating clear liquids.  Was told to drink 1tbsp/35minutes and advance slowly as tolerated.  Pt desires to get better and learn what she needs to do to avoid coming back to the hospital.  +BM's and flatus.    Objective: Vital signs in last 24 hours: Temp:  [98.4 F (36.9 C)-99.1 F (37.3 C)] 98.7 F (37.1 C) (12/03 0455) Pulse Rate:  [101-123] 106 (12/03 0455) Resp:  [16-18] 18 (12/03 0455) BP: (96-115)/(45-69) 113/64 mmHg (12/03 0455) SpO2:  [97 %-100 %] 99 % (12/03 0455) Weight:  [110 lb 4.8 oz (50.032 kg)] 110 lb 4.8 oz (50.032 kg) (12/02 1143) Last BM Date: 04/26/13  Intake/Output from previous day: 12/02 0701 - 12/03 0700 In: 1320 [P.O.:720; I.V.:600] Out: 100 [Urine:100] Intake/Output this shift: Total I/O In: 240 [P.O.:240] Out: -   PE: Gen:  Alert, NAD, pleasant Abd: Soft, ND, mod tenderness, +BS, no HSM, midline scar well healed but somewhat sensitive, colostomy with some output   Lab Results:   Recent Labs  04/26/13 1345 04/27/13 0520  WBC 13.0* 12.6*  HGB 9.5* 8.8*  HCT 29.2* 26.7*  PLT 296 300   BMET  Recent Labs  04/25/13 1830 04/26/13 1345 04/27/13 0520  NA 135  --  140  K 3.8  --  3.5  CL 97  --  107  CO2 23  --  24  GLUCOSE 75  --  83  BUN 6  --  5*  CREATININE 0.78 0.84 0.83  CALCIUM 9.7  --  9.2   PT/INR No results found for this basename: LABPROT, INR,  in the last 72 hours CMP     Component Value Date/Time   NA 140 04/27/2013 0520   K 3.5 04/27/2013 0520   CL 107 04/27/2013 0520   CO2 24 04/27/2013 0520   GLUCOSE 83 04/27/2013 0520   BUN 5* 04/27/2013 0520   CREATININE 0.83 04/27/2013 0520   CREATININE 1.04 08/27/2012 1054   CALCIUM 9.2 04/27/2013 0520   CALCIUM 9.0 07/15/2011 0920   PROT 7.7 04/25/2013 1830   ALBUMIN 3.0* 04/25/2013 1830   AST 15 04/25/2013 1830   ALT 7 04/25/2013 1830   ALKPHOS 86 04/25/2013 1830   BILITOT 0.2* 04/25/2013 1830   GFRNONAA 73* 04/27/2013 0520   GFRAA 85* 04/27/2013  0520   Lipase     Component Value Date/Time   LIPASE 13 04/25/2013 1830       Studies/Results: Ct Abdomen Pelvis W Contrast  04/26/2013   ADDENDUM REPORT: 04/26/2013 03:03  ADDENDUM: 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   Electronically Signed   By: Jearld Lesch M.D.   On: 04/26/2013 03:03   04/26/2013   CLINICAL DATA:  Abdominal pain  EXAM: CT ABDOMEN AND PELVIS WITH CONTRAST  TECHNIQUE: Multidetector CT imaging of the abdomen and pelvis was performed using the standard protocol following bolus administration of intravenous contrast.  CONTRAST:  OMNIPAQUE IOHEXOL 300 MG/ML  SOLN  COMPARISON:  04/19/2013  FINDINGS: Lung bases clear.  Normal heart size.  Unremarkable liver, biliary system, spleen, pancreas, adrenal glands. Bilateral too small to further characterize renal hypodensities. Otherwise, symmetric renal enhancement. No hydroureteronephrosis.  No complete bowel obstruction, with contrast reaching the right lower quadrant ostomy. Evidence of partial colectomy. Decrease in size of the fluid collection along the vaginal cuff, though mild complexity and fluid persists. Adjacent small bowel loop is kinked, with proximal dilatation up to 3 cm.  And there is a thickened loop within the left lower pelvis on image 56. However, contrast is seen distal to this loop. No free intraperitoneal air or new fluid collection. No lymphadenopathy.  Normal caliber aorta and branch vessels with scattered atherosclerotic disease. Coils within the right upper quadrant.  Partially decompressed bladder. Uterus presumed surgically absent. No adnexal mass.  No acute osseous finding.  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 There is no complete obstruction at this time however there is delayed transit through a segment of bowel in the right pelvis, which may cause partial or intermittent obstruction.  Fluid collection of  indeterminate sterility within the pelvis adjacent to the vaginal cuff, however decreased in size in the interval.  Electronically Signed: By: Jearld Lesch M.D. On: 04/26/2013 02:49   Dg Chest Port 1 View  04/26/2013   CLINICAL DATA:  Weakness, COPD  EXAM: PORTABLE CHEST - 1 VIEW  COMPARISON:  03/27/2013  FINDINGS: Cardiomediastinal silhouette is stable. No acute infiltrate or pulmonary edema. Extensive degenerative changes right shoulder again noted. Stable bilateral atelectasis or scarring.  IMPRESSION: No active disease. Extensive degenerative changes right shoulder again noted.   Electronically Signed   By: Natasha Mead M.D.   On: 04/26/2013 10:41    Anti-infectives: Anti-infectives   Start     Dose/Rate Route Frequency Ordered Stop   04/26/13 1300  hydroxychloroquine (PLAQUENIL) tablet 200 mg     200 mg Oral 2 times daily 04/26/13 1152         Assessment/Plan Nausea/vomiting Abdominal pain Gastroparesis Leukocytosis - improving  Plan: 1.  No surgical interventions needed 2.  Appreciate GI's gastroparesis workup 3.  Advance diet per GI's recommendations 4.  Ambulate and IS 5.  Lovenox and SCD's    LOS: 2 days    [name], Aundra Millet 04/27/2013, 8:21 AM Pager: [phone]

## 2013-04-27 NOTE — Progress Notes (Signed)
INITIAL NUTRITION ASSESSMENT  DOCUMENTATION CODES Per approved criteria  -Severe malnutrition in the context of chronic illness   INTERVENTION: Magic Cup TID with meals, each supplement provides 290 kcal and 9 grams of protein. RD to follow for nutrition care plan  NUTRITION DIAGNOSIS: Inadequate oral intake related to poor appetite as evidenced by patient report  Goal: Pt to meet >/= 90% of their estimated nutrition needs   Monitor:  PO & supplemental intake, weight, labs, I/O's  Reason for Assessment: Malnutrition Screening Tool Report  62 y.o. female  Admitting Dx: nausea & vomiting  ASSESSMENT: Patient with PMH of Crohns' disease, GERD, ileocolitis and recent SBO (~ 5 weeks ago); presented with persistent nausea and vomiting for a few days associated with midline abdominal tenderness; CT abdomen and pelvis did not reveal any definite obstruction but possible partial obstruction.   Patient reports her appetite has been poor for the last month; she usually only consumes 1 meal and then grazes on other foods; she also states she's lost weight; per weight readings, patient has had a 13% weight loss since beginning of November (severe for time frame); amenable to [employer] supplements -- will add to her meal trays.  Patient meets criteria for severe malnutrition in the context of chronic illness as evidenced by < 75% intake of estimated energy requirement for > 1 month and 13% weight loss x 1 month.  Height: Ht Readings from Last 1 Encounters:  04/26/13 5\' 1"  (1.549 m)    Weight: Wt Readings from Last 1 Encounters:  04/26/13 110 lb 4.8 oz (50.032 kg)    Ideal Body Weight: 105 lb  % Ideal Body Weight: 105%  Wt Readings from Last 10 Encounters:  04/26/13 110 lb 4.8 oz (50.032 kg)  04/19/13 109 lb 2 oz (49.5 kg)  04/11/13 109 lb (49.442 kg)  03/30/13 126 lb 12.2 oz (57.5 kg)  03/16/13 144 lb 10 oz (65.6 kg)  03/16/13 144 lb 10 oz (65.6 kg)  03/16/13 144 lb 10 oz (65.6  kg)  03/09/13 119 lb (53.978 kg)  02/09/13 117 lb 9.6 oz (53.343 kg)  01/19/13 121 lb 9.6 oz (55.157 kg)    Usual Body Weight: 126 lb  % Usual Body Weight: 87%  BMI:  Body mass index is 20.85 kg/(m^2).  Estimated Nutritional Needs: Kcal: 1400-1600 Protein: 70-80 gm Fluid: >/= 1.5 L  Skin: Intact  Diet Order: Bariatric Full Liquid  EDUCATION NEEDS: -No education needs identified at this time   Intake/Output Summary (Last 24 hours) at 04/27/13 1424 Last data filed at 04/27/13 0730  Gross per 24 hour  Intake   1440 ml  Output      0 ml  Net   1440 ml    Labs:   Recent Labs Lab 04/25/13 1830 04/26/13 1345 04/27/13 0520  NA 135  --  140  K 3.8  --  3.5  CL 97  --  107  CO2 23  --  24  BUN 6  --  5*  CREATININE 0.78 0.84 0.83  CALCIUM 9.7  --  9.2  GLUCOSE 75  --  83    CBG (last 3)   Recent Labs  04/26/13 1701 04/26/13 2115 04/27/13 0619  GLUCAP 153* 85 89    Scheduled Meds: . arformoterol  15 mcg Nebulization BID  . budesonide  0.25 mg Nebulization BID  . citalopram  20 mg Oral Daily  . diltiazem  180 mg Oral BID  . enoxaparin (LOVENOX) injection  40 mg Subcutaneous Q24H  . fluticasone  2 spray Each Nare Daily  .  HYDROmorphone (DILAUDID) injection  1 mg Intravenous Once  . hydroxychloroquine  200 mg Oral BID  . metoprolol tartrate  12.5 mg Oral BID  . sodium chloride  3 mL Intravenous Q12H  . tiotropium  18 mcg Inhalation Daily    Continuous Infusions: . dextrose 5 % and 0.9% NaCl 50 mL/hr at 04/27/13 6213    Past Medical History  Diagnosis Date  . Other diseases of vocal cords   . Osteoporosis, unspecified   . Other and unspecified hyperlipidemia   . Esophageal reflux   . Chronic airway obstruction, not elsewhere classified   . Regional enteritis of unspecified site   . 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 Crohn's disease     Ileostomy  . Pelvic adhesions   . Anxiety   . Depression   . Hypertension   .  Emphysema   . Hematuria   . Severe cervical dysplasia age 27    cone biopsy with neg paps since  . Pneumonia     "3-4 times; necrotizing once" (04/19/2013)  . Asthmatic bronchitis , chronic   . Exertional shortness of breath   . On home oxygen therapy     "24/7; 2.5L @ rest; 3L when I'm doing my chores" (04/19/2013)  . Type II diabetes mellitus   . Anemia   . History of blood transfusion     "I've had 3 or 4" (04/19/2013)  . Rheumatoid arthritis(714.0) 03/2012    [place]; "mild in my hands" (04/19/2013)    Past Surgical History  Procedure Laterality Date  . Ileostomy  2007  . Ear cyst excision Bilateral 1970's    "lobes"   . Cervical cone biopsy  1978  . Colon surgery  2007    subtotal colectomy with ileostomy  . Esophagogastroduodenoscopy (egd) with esophageal dilation      "3 times, I think" (04/19/2013)  . Oophorectomy      LSO  . Tracheostomy  2010  . Hammer toe surgery Bilateral 1980's  . Balloon dilation  02/10/2012    Procedure: BALLOON DILATION;  Surgeon: Charolett Bumpers, MD;  Location: [facility] END[facility]COPY;  Service: Endoscopy;  Laterality: N/A;  . Colposcopy    . Flexible sigmoidoscopy  03/26/2012    Procedure: FLEXIBLE SIGMOIDOSCOPY;  Surgeon: Charolett Bumpers, MD;  Location: [facility];  Service: Endoscopy;  Laterality: N/A;  . Cardiac catheterization  07/08/2007    normal  . Nm myocar perf wall motion  09/21/2006    no ischemia  . Ventral hernia repair N/A 03/16/2013    Procedure: REPAIR OF INCISIONAL AND PARASTOMAL HERNIAS;  Surgeon: Mariella Saa, MD;  Location: [facility] OR;  Service: General;  Laterality: N/A;  . Laparotomy N/A 03/16/2013    Procedure: EXPLORATORY LAPAROTOMY;  Surgeon: Mariella Saa, MD;  Location: [facility] OR;  Service: General;  Laterality: N/A;  . Lysis of adhesion N/A 03/16/2013    Procedure: LYSIS OF ADHESIONS FOR SMALL BOWEL OBSTRUCTION;  Surgeon: Mariella Saa, MD;  Location: [facility] OR;  Service: General;  Laterality: N/A;  . Insertion  of mesh N/A 03/16/2013    Procedure: INSERTION OF BIOLOGIC MESH;  Surgeon: Mariella Saa, MD;  Location: [facility] OR;  Service: General;  Laterality: N/A;  . Tee without cardioversion N/A 03/21/2013    Procedure: TRANSESOPHAGEAL ECHOCARDIOGRAM (TEE);  Surgeon: Vesta Mixer, MD;  Location: [facility] ENDOSCOPY;  Service: Cardiovascular;  Laterality: N/A;  .  Vaginal hysterectomy  1993    LAVH-LSO  . Tracheostomy closure  2010    Maureen Chatters, RD, LDN Pager #: [phone] After-Hours Pager #: [phone]

## 2013-04-28 LAB — GLUCOSE, CAPILLARY
Glucose-Capillary: 102 mg/dL — ABNORMAL HIGH (ref 70–99)
Glucose-Capillary: 131 mg/dL — ABNORMAL HIGH (ref 70–99)

## 2013-04-28 NOTE — Progress Notes (Signed)
[facility]                                                                                Patient Demographics  [name], is a 62 y.o. female, DOB - [birth year], OZH:[id number]  Admit date - 04/25/2013   Admitting Physician Kathlen Mody, MD  Outpatient Primary MD for the patient is [name], MD  LOS - 3   Chief Complaint  Patient presents with  . Emesis  . Abdominal Pain        Assessment & Plan    1. Abdominal pain with postprandial nausea and vomiting suggestive of gastroenteritis versus gastroparesis. No CT evidence of Crohn's colitis exacerbation or intra-abdominal abscess.- Being seen by GI and general surgery, per GI this is likely functional, her C. difficile panel is negative, she is now tolerating clear liquid diet and if his clinically much improved. We'll continue to advance diet as guided by GI. If stable will discharge home in 24-48 hours.   2.  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 Will monitor with GI as in #1 above.    3. History of COPD. No acute issues, uses oxygen when relating, nebulizer treatments as needed.    4. Dyslipidemia resume home dose statin.    5. Hypertension. Stable resume home medications.    6. Diabetes mellitus type 2 mentioned in her chart - A1c is 5.6, stop sliding scale.   Lab Results  Component Value Date   HGBA1C 5.6 03/16/2013    CBG (last 3)   Recent Labs  04/27/13 2135 04/28/13 0618 04/28/13 1125  GLUCAP 97 102* 131*      Code Status: Full  Family  Communication: None present  Disposition Plan: Home   Procedures CT abdomen pelvis   Consults GI, general surgery   Medications  Scheduled Meds: . arformoterol  15 mcg Nebulization BID  . budesonide  0.25 mg Nebulization BID  . citalopram  20 mg Oral Daily  . diltiazem  180 mg Oral BID  . enoxaparin (LOVENOX) injection  40 mg Subcutaneous Q24H  . fluticasone  2 spray Each Nare Daily  .  HYDROmorphone (DILAUDID) injection  1 mg Intravenous Once  . hydroxychloroquine  200 mg Oral BID  . metoprolol tartrate  12.5 mg Oral BID  . sodium chloride  3 mL Intravenous Q12H  . tiotropium  18 mcg Inhalation Daily   Continuous Infusions: . dextrose 5 % and 0.9% NaCl 50 mL/hr at 04/28/13 0459   PRN Meds:.LORazepam, ondansetron (ZOFRAN) IV, ondansetron, promethazine  DVT Prophylaxis  Lovenox  Lab Results  Component Value Date   PLT 300 04/27/2013    Antibiotics    Anti-infectives   Start     Dose/Rate Route Frequency Ordered Stop   04/26/13 1300  hydroxychloroquine (PLAQUENIL) tablet 200 mg  200 mg Oral 2 times daily 04/26/13 1152            Subjective:   [name] today has, No headache, No chest pain, improved abdominal pain - No Nausea and tolerating clear liquid diet, No new weakness tingling or numbness, No Cough - SOB.   Objective:   Filed Vitals:   04/27/13 2137 04/27/13 2230 04/28/13 0517 04/28/13 0953  BP: 100/41  100/44   Pulse: 88  80   Temp: 99 F (37.2 C)  98.6 F (37 C)   TempSrc: Oral  Oral   Resp: 16  16   Height:      Weight:      SpO2: 100% 99% 100% 97%    Wt Readings from Last 3 Encounters:  04/26/13 50.032 kg (110 lb 4.8 oz)  04/19/13 49.5 kg (109 lb 2 oz)  04/11/13 49.442 kg (109 lb)     Intake/Output Summary (Last 24 hours) at 04/28/13 1339 Last data filed at 04/28/13 0730  Gross per 24 hour  Intake    480 ml  Output      0 ml  Net    480 ml    Exam Awake Alert, Oriented X 3, No new F.N deficits, Normal  affect [place].AT,PERRAL Supple Neck,No JVD, No cervical lymphadenopathy appriciated.  Symmetrical Chest wall movement, Good air movement bilaterally, CTAB RRR,No Gallops,Rubs or new Murmurs, No Parasternal Heave +ve B.Sounds, Abd Soft, some generalized tenderness, No organomegaly appriciated, No rebound - guarding or rigidity. Ileostomy bag in place stable No Cyanosis, Clubbing or edema, No new Rash or bruise    Data Review   Micro Results Recent Results (from the past 240 hour(s))  CULTURE, BLOOD (ROUTINE X 2)     Status: None   Collection Time    04/19/13  9:10 AM      Result Value Range Status   Specimen Description BLOOD RIGHT ANTECUBITAL   Final   Special Requests BOTTLES DRAWN AEROBIC ONLY 10CC   Final   Culture  Setup Time     Final   Value: 04/19/2013 14:00     Performed at [facility]   Culture     Final   Value: NO GROWTH 5 DAYS     Performed at [facility]   Report Status 04/25/2013 FINAL   Final  CULTURE, BLOOD (ROUTINE X 2)     Status: None   Collection Time    04/19/13  9:15 AM      Result Value Range Status   Specimen Description BLOOD ARM RIGHT   Final   Special Requests BOTTLES DRAWN AEROBIC ONLY 10CC   Final   Culture  Setup Time     Final   Value: 04/19/2013 14:00     Performed at [facility]   Culture     Final   Value: NO GROWTH 5 DAYS     Performed at [facility]   Report Status 04/25/2013 FINAL   Final  CLOSTRIDIUM DIFFICILE BY PCR     Status: None   Collection Time    04/26/13  9:05 AM      Result Value Range Status   C difficile by pcr NEGATIVE  NEGATIVE Final    Radiology Reports Dg Abd 1 View  04/27/2013   CLINICAL DATA:  Small bowel obstruction.  EXAM: ABDOMEN - 1 VIEW  COMPARISON:  04/26/2013  FINDINGS: Right-sided ostomy noted.  Coils project over the upper abdomen.  No dilated bowel. Much  of the right-sided abdominal bowel is gasless.  Sclerosis in both femoral heads compatible with avascular necrosis.   Linear retrocardiac density is present.  IMPRESSION: 1. No currently dilated bowel to suggest obstruction. A considerable amount of the right-sided bowel is gasless which can sometimes hide bowel pathology. 2. Bilateral hip avascular necrosis. 3. Subsegmental atelectasis in the left lower lobe.   Electronically Signed   By: Herbie Baltimore M.D.   On: 04/27/2013 10:02   Ct Angio Chest Pe W/cm &/or Wo Cm  04/26/2013   CLINICAL DATA:  Chest pain, shortness of breath and abdominal pain.  EXAM: CT ANGIOGRAPHY CHEST  CT ABDOMEN AND PELVIS WITH CONTRAST  TECHNIQUE: Multidetector CT imaging of the chest was performed using the standard protocol during bolus administration of intravenous contrast. Multiplanar CT image reconstructions including MIPs were obtained to evaluate the vascular anatomy. Multidetector CT imaging of the abdomen and pelvis was performed using the standard protocol during bolus administration of intravenous contrast.  CONTRAST:  100 mL OMNIPAQUE IOHEXOL 350 MG/ML SOLN  COMPARISON:  CT abdomen and pelvis 04/03/2013, 03/27/2013 and 03/14/2013. CT chest 12/01/2011.  FINDINGS: CTA CHEST FINDINGS  No pulmonary embolus is identified. Heart size is normal. No pleural or pericardial effusion. No axillary, hilar or mediastinal lymphadenopathy. The lungs demonstrate extensive emphysematous change. There is some scarring in the right upper lobe. The lungs are otherwise clear.  CT ABDOMEN and PELVIS FINDINGS  Again seen and is postoperative change of colectomy with a Hartmann's pouch and right lower quadrant ileostomy. Pigtail catheter in the pelvis seen on the prior examination has been removed. There is a small fluid collection at the former site of the catheter measuring 1.7 cm transverse by 4.1 cm AP by 1.6 cm craniocaudal. Very small subcutaneous fluid collection to the right of midline seen on the most recent examination has resolved.  There is a single mildly dilated loop of small bowel in the right  hemipelvis with fecalized contents. This loop measures 2.8 cm in diameter and may be dilated secondary to focal ileus or partial obstruction possibly related to adhesions or surgical anastomosis in the right hemipelvis. Distal small bowel loops are completely decompressed.  The liver, gallbladder, spleen and adrenal glands are unremarkable. Small renal cysts are noted. There is no lymphadenopathy. 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  Review of the MIP images confirms the above findings.  IMPRESSION: Status post removal of pigtail catheter from the pelvis. There is a small, new fluid collection in the pelvis measuring 1.7 cm transverse by 4.1 cm AP x 1.6 cm craniocaudal which may represent recurrent abscess.  Mildly dilated loop of small bowel in the pelvis could be due to focal ileus or partial obstruction secondary to adhesions or stricture at a surgical anastomosis.  Very small subcutaneous fluid collection to the right of midline seen on the most recent examination has resolved.  Negative for pulmonary embolus or acute cardiopulmonary disease.  Emphysema.  Avascular necrosis of the femoral heads, worse on the left.   Electronically Signed   By: Drusilla Kanner M.D.   On: 04/26/2013 11:59   Ct Abdomen Pelvis W Contrast  04/26/2013   ADDENDUM REPORT: 04/26/2013 03:03  ADDENDUM: 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   Electronically Signed   By: Jearld Lesch M.D.   On: 04/26/2013 03:03   04/26/2013   CLINICAL DATA:  Abdominal pain  EXAM: CT ABDOMEN AND PELVIS  WITH CONTRAST  TECHNIQUE: Multidetector CT imaging of the abdomen and pelvis was performed using the standard protocol following bolus administration of intravenous contrast.  CONTRAST:  OMNIPAQUE IOHEXOL 300 MG/ML  SOLN  COMPARISON:  04/19/2013  FINDINGS: Lung bases clear.  Normal heart size.  Unremarkable liver, biliary system, spleen, pancreas,  adrenal glands. Bilateral too small to further characterize renal hypodensities. Otherwise, symmetric renal enhancement. No hydroureteronephrosis.  No complete bowel obstruction, with contrast reaching the right lower quadrant ostomy. Evidence of partial colectomy. Decrease in size of the fluid collection along the vaginal cuff, though mild complexity and fluid persists. Adjacent small bowel loop is kinked, with proximal dilatation up to 3 cm. And there is a thickened loop within the left lower pelvis on image 56. However, contrast is seen distal to this loop. No free intraperitoneal air or new fluid collection. No lymphadenopathy.  Normal caliber aorta and branch vessels with scattered atherosclerotic disease. Coils within the right upper quadrant.  Partially decompressed bladder. Uterus presumed surgically absent. No adnexal mass.  No acute osseous finding.  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 There is no complete obstruction at this time however there is delayed transit through a segment of bowel in the right pelvis, which may cause partial or intermittent obstruction.  Fluid collection of indeterminate sterility within the pelvis adjacent to the vaginal cuff, however decreased in size in the interval.  Electronically Signed: By: Jearld Lesch M.D. On: 04/26/2013 02:49  Dg Chest Port 1 View  04/26/2013   CLINICAL DATA:  Weakness, COPD  EXAM: PORTABLE CHEST - 1 VIEW  COMPARISON:  03/27/2013  FINDINGS: Cardiomediastinal silhouette is stable. No acute infiltrate or pulmonary edema. Extensive degenerative changes right shoulder again noted. Stable bilateral atelectasis or scarring.  IMPRESSION: No active disease. Extensive degenerative changes right shoulder again noted.   Electronically Signed   By: Natasha Mead M.D.   On: 04/26/2013 10:41    CBC  Recent Labs Lab 04/25/13 1830 04/26/13 1345 04/27/13 0520  WBC 15.6* 13.0* 12.6*  HGB 10.5* 9.5* 8.8*  HCT 32.2*  29.2* 26.7*  PLT 335 296 300  MCV 84.7 84.6 84.2  MCH 27.6 27.5 27.8  MCHC 32.6 32.5 33.0  RDW 14.0 14.6 14.3  LYMPHSABS 2.4  --   --   MONOABS 1.0  --   --   EOSABS 0.3  --   --   BASOSABS 0.0  --   --     Chemistries   Recent Labs Lab 04/25/13 1830 04/26/13 1345 04/27/13 0520  NA 135  --  140  K 3.8  --  3.5  CL 97  --  107  CO2 23  --  24  GLUCOSE 75  --  83  BUN 6  --  5*  CREATININE 0.78 0.84 0.83  CALCIUM 9.7  --  9.2  AST 15  --   --   ALT 7  --   --   ALKPHOS 86  --   --   BILITOT 0.2*  --   --    ------------------------------------------------------------------------------------------------------------------ estimated creatinine clearance is 52.4 ml/min (by C-G formula based on Cr of 0.83). ------------------------------------------------------------------------------------------------------------------ No results found for this basename: HGBA1C,  in the last 72 hours ------------------------------------------------------------------------------------------------------------------ No results found for this basename: CHOL, HDL, LDLCALC, TRIG, CHOLHDL, LDLDIRECT,  in the last 72 hours ------------------------------------------------------------------------------------------------------------------ No results found for this basename: TSH, T4TOTAL, FREET3, T3FREE, THYROIDAB,  in the last 72 hours ------------------------------------------------------------------------------------------------------------------ No results  found for this basename: VITAMINB12, FOLATE, FERRITIN, TIBC, IRON, RETICCTPCT,  in the last 72 hours  Coagulation profile No results found for this basename: INR, PROTIME,  in the last 168 hours  No results found for this basename: DDIMER,  in the last 72 hours  Cardiac Enzymes No results found for this basename: CK, CKMB, TROPONINI, MYOGLOBIN,  in the last 168  hours ------------------------------------------------------------------------------------------------------------------ No components found with this basename: POCBNP,      Time Spent in minutes   35   Jenevie Casstevens K M.D on 04/28/2013 at 1:39 PM  Between 7am to 7pm - Pager - [phone]  After 7pm go to [email or web address] - password [email or web address]  And look for the night coverage person covering for me after hours  [facility] Office  [phone]

## 2013-04-28 NOTE — Care Management Note (Signed)
    Page 1 of 1   04/29/2013     3:04:35 PM   CARE MANAGEMENT NOTE 04/29/2013  Patient:  SERENITI, WAN A   Account Number:  [id number]  Date Initiated:  04/28/2013  Documentation initiated by:  AMERSON,JULIE  Subjective/Objective Assessment:   PT ADM ON 04/25/13 WITH ABD PAIN.  PTA, PT INDEPENDENT OF ADLS.  SHE IS FOLLOWED BY [facility].     Action/Plan:   WILL FOLLOW FOR DC NEEDS AS PT PROGRESSES.   Anticipated DC Date:  04/29/2013   Anticipated DC Plan:  HOME/SELF CARE      DC Planning Services  CM consult      Choice offered to / List presented to:          [facility] arranged  HH-1 RN  HH-2 PT      [facility] agency  [facility]   Status of service:  In process, will continue to follow Medicare Important Message given?   (If response is "NO", the following Medicare IM given date fields will be blank) Date Medicare IM given:   Date Additional Medicare IM given:    Discharge Disposition:    Per UR Regulation:  Reviewed for med. necessity/level of care/duration of stay  If discussed at Long Length of Stay Meetings, dates discussed:    Comments:  04-29-13 3pm Avie Arenas, RNBSN [phone] Per [facility] program "She (pt) has been hospitalized x3 I know in the past two months once for SBO had surgery, then came back in with infection/abcess and now with nausea/gastroporesis. She was active with [facility] prior to admission. According to [facility] note she was active with RN services."  Asked for [facility] RN and PT on discharge.  Order written.  Talked with pateint, she is agreeable to this and would like to continue with [facility] and would like to continue with her nurse Erie Noe.

## 2013-04-28 NOTE — Progress Notes (Signed)
Inadvertently got ahead of her dietary progression and had 2 bowls of soup last night, in addition to her 2 oz po q 30'.  She then developed upper abdominal discomfort and nausea requiring pain and nausea medication (no vomiting).  Slt uncomfortable after a similar-sized breakfast this morning.  Exam:  abd less tender, essentially nontender.  Rib cage no longer tender.  Impr:  Still feel this is c/w low-grade gastroparesis as a reaction to her recent surgery, abscess, and medications.  Plan:  Clarified dietary progression for patient.  We will try to go with larger, less frequent boluses today:  4 oz every hour; if tolerated, may also have soda crackers tonight.  If this is not tolerated, would try metoclopramide.  If it is tolerated, low residue breakfast tomorrow morning with possible dischg late tomorrow.  Florencia Reasons, M.D. [phone]

## 2013-04-28 NOTE — Progress Notes (Signed)
Patient active with [facility] Care Management services for COPD management. Spoke with her at bedside. She is supposed to have appointment with Dr Johna Sheriff on tomorrow, 04/29/13. Called on patient's behalf to Dr [facility] office to make aware patient in hospital and that she will call to reschedule appointment after hospital discharge. Will make inpatient RNCM aware that [facility] Care Management following.  [name], MSN- Ed, RN,BSN- [facility] [phone]

## 2013-04-28 NOTE — Progress Notes (Signed)
[facility]  Patient Status: Active (receiving services up to time of hospitalization)  [facility] is providing the following services: RN  If patient discharges after hours, please call [phone].   [facility] 04/28/2013, 11:40 AM

## 2013-04-29 ENCOUNTER — Encounter (INDEPENDENT_AMBULATORY_CARE_PROVIDER_SITE_OTHER): Payer: Medicare Other | Admitting: General Surgery

## 2013-04-29 LAB — GLUCOSE, CAPILLARY
Glucose-Capillary: 90 mg/dL (ref 70–99)
Glucose-Capillary: 102 mg/dL — ABNORMAL HIGH (ref 70–99)

## 2013-04-29 MED ORDER — ONDANSETRON HCL 4 MG PO TABS: 4.0000 mg | ORAL_TABLET | Freq: Four times a day (QID) | ORAL | Status: DC | PRN

## 2013-04-29 MED ORDER — PROMETHAZINE HCL 25 MG/ML IJ SOLN: 12.5000 mg | Freq: Four times a day (QID) | INTRAMUSCULAR | Status: DC | PRN

## 2013-04-29 MED ORDER — METOPROLOL TARTRATE 25 MG PO TABS: 12.5000 mg | ORAL_TABLET | Freq: Two times a day (BID) | ORAL | Status: DC

## 2013-04-29 NOTE — Progress Notes (Signed)
Continues to do well from GI standpoint.  Taking clr liq 4 oz every hr w/out problem and had soda crackers last night w/ just minimal stomach "sensation" (no pain or nausea, assumed to reflect "re-feeding" adjustment of stomach).  No abdominal tenderness to exam.  In very good spirits.  Plan:    1.  Advance to low-fiber diet today (encouraged frequent small meals).    2.  OK for dischg later today or tomorrow if tolerated.    3.  Have advised pt to f/u w/ her primary GI (Dr. Reece Agar) in a few weeks to bring him up to speed on her condition.\  4.  Will sign off at this time; however, if further input from [place] is needed (for example, if pt does not tolerate solid food), please feel free to call us back at [phone].  Florencia Reasons, M.D. [phone]

## 2013-04-29 NOTE — Discharge Summary (Signed)
[facility]                                                                                   [name], is a 62 y.o. female  DOB [birth year]  MRN [id number].  Admission date:  04/25/2013  Admitting Physician  Kathlen Mody, MD  Discharge Date:  04/29/2013   Primary MD  [name], MD  Recommendations for primary care physician for things to follow:   Follow clinically, repeat CBC BMP in a week   Admission Diagnosis  Crohn's disease [555.9] Leukocytosis [288.60] Vomiting [787.03] Abdominal pain [789.00]  Discharge Diagnosis  stable  Active Problems:   Abdominal pain   Vomiting   SBO (small bowel obstruction)      Past Medical History  Diagnosis Date  . Other diseases of vocal cords   . Osteoporosis, unspecified   . Other and unspecified hyperlipidemia   . Esophageal reflux   . Chronic airway obstruction, not elsewhere classified   . Regional enteritis of unspecified site   . 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 Crohn's disease     Ileostomy  . Pelvic adhesions   . Anxiety   . Depression   . Hypertension   . Emphysema   . Hematuria   . Severe cervical dysplasia age 60    cone biopsy with neg paps since  . Pneumonia     "3-4 times; necrotizing once" (04/19/2013)  . Asthmatic bronchitis , chronic   . Exertional shortness of breath   . On home oxygen therapy     "24/7; 2.5L @ rest; 3L when I'm doing my chores" (04/19/2013)  . Type II diabetes mellitus   . Anemia   . History of blood transfusion     "I've had 3 or 4" (04/19/2013)  . Rheumatoid arthritis(714.0) 03/2012    [place]; "mild in my hands" (04/19/2013)    Past Surgical History  Procedure Laterality Date  . Ileostomy  2007  . Ear cyst excision Bilateral 1970's    "lobes"   . Cervical cone biopsy  1978  . Colon surgery  2007    subtotal colectomy with ileostomy  . Esophagogastroduodenoscopy (egd) with esophageal dilation      "3 times, I think" (04/19/2013)   . Oophorectomy      LSO  . Tracheostomy  2010  . Hammer toe surgery Bilateral 1980's  . Balloon dilation  02/10/2012    Procedure: BALLOON DILATION;  Surgeon: Charolett Bumpers, MD;  Location: [facility];  Service: Endoscopy;  Laterality: N/A;  . Colposcopy    . Flexible sigmoidoscopy  03/26/2012    Procedure: FLEXIBLE SIGMOIDOSCOPY;  Surgeon: Charolett Bumpers, MD;  Location: [facility];  Service: Endoscopy;  Laterality: N/A;  . Cardiac catheterization  07/08/2007    normal  . Nm myocar perf wall motion  09/21/2006    no ischemia  . Ventral hernia repair N/A 03/16/2013    Procedure: REPAIR OF INCISIONAL AND PARASTOMAL HERNIAS;  Surgeon: Mariella Saa, MD;  Location: [facility] OR;  Service: General;  Laterality: N/A;  . Laparotomy N/A 03/16/2013    Procedure: EXPLORATORY LAPAROTOMY;  Surgeon: Mariella Saa, MD;  Location: [facility] OR;  Service: General;  Laterality: N/A;  . Lysis of adhesion N/A 03/16/2013    Procedure: LYSIS OF ADHESIONS FOR SMALL BOWEL OBSTRUCTION;  Surgeon: Mariella Saa, MD;  Location: [facility] OR;  Service: General;  Laterality: N/A;  . Insertion of mesh N/A 03/16/2013    Procedure: INSERTION OF BIOLOGIC MESH;  Surgeon: Mariella Saa, MD;  Location: [facility] OR;  Service: General;  Laterality: N/A;  . Tee without cardioversion N/A 03/21/2013    Procedure: TRANSESOPHAGEAL ECHOCARDIOGRAM (TEE);  Surgeon: Vesta Mixer, MD;  [facility];  Service: Cardiovascular;  Laterality: N/A;  . Vaginal hysterectomy  1993    LAVH-LSO  . Tracheostomy closure  2010     Discharge Condition: stable   Follow-up Information   Follow up with [name], MD. Schedule an appointment as soon as possible for a visit in 1 week.   Specialty:  Internal Medicine   Contact information:   [address] [place] [address] [phone]       Follow up with Charolett Bumpers, MD. Schedule an appointment as soon as possible for a visit in 1 week.    Specialty:  Gastroenterology   Contact information:   [address], [address] [place] [address] [phone]       Follow up with CCS,MD, MD. Schedule an appointment as soon as possible for a visit in 1 week.   Specialty:  General Surgery   Contact information:   [address] [place] [address] [phone]         Consults obtained - GI   Discharge Medications      Medication List    STOP taking these medications       amoxicillin-clavulanate 875-125 MG per tablet  Commonly known as:  AUGMENTIN      TAKE these medications       arformoterol 15 MCG/2ML Nebu  Commonly known as:  BROVANA  Take 15 mcg by nebulization 2 (two) times daily.     aspirin 81 MG tablet  Take 81 mg by mouth every Monday, Wednesday, and Friday.     budesonide 0.25 MG/2ML nebulizer solution  Commonly known as:  PULMICORT  Take 0.25 mg by nebulization 2 (two) times daily.     celecoxib 200 MG capsule  Commonly known as:  CELEBREX  Take 200 mg by mouth daily.     citalopram 20 MG tablet  Commonly known as:  CELEXA  Take 20 mg by mouth daily.     diltiazem 180 MG 24 hr capsule  Commonly known as:  CARDIZEM CD  Take 180 mg by mouth 2 (two) times daily.     famotidine 20 MG tablet  Commonly known as:  PEPCID  Take 20 mg by mouth 2 (two) times daily.     fluticasone 50 MCG/ACT nasal spray  Commonly known as:  FLONASE  Place 2 sprays into the nose daily.     FLUTTER Devi  Inhale 2 application into the lungs every 4 (four) hours.     furosemide 20 MG tablet  Commonly known as:  LASIX  Take 20 mg by mouth 2 (two) times daily. Once or twice daily as needed for swelling     HYDROcodone-acetaminophen 5-325 MG per tablet  Commonly known as:  NORCO/VICODIN  Take 1-2 tablets by mouth every 4 (four) hours as needed for moderate pain.     hydroxychloroquine 200 MG tablet  Commonly known as:  PLAQUENIL  Take 200 mg by mouth Twice daily.     IRON PO  Take  1 tablet by mouth every Monday, Wednesday, and Friday.     LORazepam 0.5 MG tablet  Commonly known as:  ATIVAN  Take 0.5 mg by mouth every 8 (eight) hours as needed for anxiety.     metoprolol tartrate 25 MG tablet  Commonly known as:  LOPRESSOR  Take 0.5 tablets (12.5 mg total) by mouth 2 (two) times daily.  Start taking on:  05/04/2013     multivitamin with minerals Tabs tablet  Take 1 tablet by mouth daily.     omeprazole 40 MG capsule  Commonly known as:  PRILOSEC  Take 40 mg by mouth 2 (two) times daily.     ondansetron 4 MG tablet  Commonly known as:  ZOFRAN  Take 1 tablet (4 mg total) by mouth every 6 (six) hours as needed for nausea.     promethazine 25 MG/ML injection  Commonly known as:  PHENERGAN  Inject 0.5 mLs (12.5 mg total) into the vein every 6 (six) hours as needed for nausea or vomiting.     rosuvastatin 5 MG tablet  Commonly known as:  CRESTOR  Take 5 mg by mouth every Monday, Wednesday, and Friday.     tiotropium 18 MCG inhalation capsule  Commonly known as:  SPIRIVA  Place 18 mcg into inhaler and inhale daily.     VICTOZA 18 MG/3ML Soln injection  Generic drug:  Liraglutide  Inject 1.8 mg into the skin daily. 1.8 units once daily     Vitamin D 1000 UNITS capsule  Take 1,000 Units by mouth daily.         Diet and Activity recommendation: See Discharge Instructions below   Discharge Instructions     Follow with Primary MD [name], MD in 7 days   Get CBC, CMP, checked 7 days by Primary MD and again as instructed by your Primary MD.   Get Medicines reviewed and adjusted.  Please request your Prim.MD to go over all Hospital Tests and Procedure/Radiological results at the follow up, please get all Hospital records sent to your Prim MD by signing hospital release before you go home.  Activity: As tolerated with Full fall precautions use walker/cane & assistance as needed   Diet:  Low fiber small meals 5-6 times a day  For Heart  failure patients - Check your Weight same time everyday, if you gain over 2 pounds, or you develop in leg swelling, experience more shortness of breath or chest pain, call your Primary MD immediately. Follow Cardiac Low Salt Diet and 1.8 lit/day fluid restriction.  Disposition Home   If you experience worsening of your admission symptoms, develop shortness of breath, life threatening emergency, suicidal or homicidal thoughts you must seek medical attention immediately by calling 911 or calling your MD immediately  if symptoms less severe.  You Must read complete instructions/literature along with all the possible adverse reactions/side effects for all the Medicines you take and that have been prescribed to you. Take any new Medicines after you have completely understood and accpet all the possible adverse reactions/side effects.   Do not drive and provide baby sitting services if your were admitted for syncope or siezures until you have seen by Primary MD or a Neurologist and advised to do so again.  Do not drive when taking Pain medications.    Do not take more than prescribed Pain, Sleep and Anxiety Medications  Special Instructions: If  you have smoked or chewed Tobacco  in the last 2 yrs please stop smoking, stop any regular Alcohol  and or any Recreational drug use.  Wear Seat belts while driving.   Please note  You were cared for by a hospitalist during your hospital stay. If you have any questions about your discharge medications or the care you received while you were in the hospital after you are discharged, you can call the unit and asked to speak with the hospitalist on call if the hospitalist that took care of you is not available. Once you are discharged, your primary care physician will handle any further medical issues. Please note that NO REFILLS for any discharge medications will be authorized once you are discharged, as it is imperative that you return to your primary care  physician (or establish a relationship with a primary care physician if you do not have one) for your aftercare needs so that they can reassess your need for medications and monitor your lab values.   Major procedures and Radiology Reports - PLEASE review detailed and final reports for all details, in brief -       Dg Abd 1 View  04/27/2013   CLINICAL DATA:  Small bowel obstruction.  EXAM: ABDOMEN - 1 VIEW  COMPARISON:  04/26/2013  FINDINGS: Right-sided ostomy noted.  Coils project over the upper abdomen.  No dilated bowel. Much of the right-sided abdominal bowel is gasless.  Sclerosis in both femoral heads compatible with avascular necrosis.  Linear retrocardiac density is present.  IMPRESSION: 1. No currently dilated bowel to suggest obstruction. A considerable amount of the right-sided bowel is gasless which can sometimes hide bowel pathology. 2. Bilateral hip avascular necrosis. 3. Subsegmental atelectasis in the left lower lobe.   Electronically Signed   By: Herbie Baltimore M.D.   On: 04/27/2013 10:02   Ct Angio Chest Pe W/cm &/or Wo Cm  04/26/2013   CLINICAL DATA:  Chest pain, shortness of breath and abdominal pain.  EXAM: CT ANGIOGRAPHY CHEST  CT ABDOMEN AND PELVIS WITH CONTRAST  TECHNIQUE: Multidetector CT imaging of the chest was performed using the standard protocol during bolus administration of intravenous contrast. Multiplanar CT image reconstructions including MIPs were obtained to evaluate the vascular anatomy. Multidetector CT imaging of the abdomen and pelvis was performed using the standard protocol during bolus administration of intravenous contrast.  CONTRAST:  100 mL OMNIPAQUE IOHEXOL 350 MG/ML SOLN  COMPARISON:  CT abdomen and pelvis 04/03/2013, 03/27/2013 and 03/14/2013. CT chest 12/01/2011.  FINDINGS: CTA CHEST FINDINGS  No pulmonary embolus is identified. Heart size is normal. No pleural or pericardial effusion. No axillary, hilar or mediastinal lymphadenopathy. The lungs  demonstrate extensive emphysematous change. There is some scarring in the right upper lobe. The lungs are otherwise clear.  CT ABDOMEN and PELVIS FINDINGS  Again seen and is postoperative change of colectomy with a Hartmann's pouch and right lower quadrant ileostomy. Pigtail catheter in the pelvis seen on the prior examination has been removed. There is a small fluid collection at the former site of the catheter measuring 1.7 cm transverse by 4.1 cm AP by 1.6 cm craniocaudal. Very small subcutaneous fluid collection to the right of midline seen on the most recent examination has resolved.  There is a single mildly dilated loop of small bowel in the right hemipelvis with fecalized contents. This loop measures 2.8 cm in diameter and may be dilated secondary to focal ileus or partial obstruction possibly related to adhesions or surgical  anastomosis in the right hemipelvis. Distal small bowel loops are completely decompressed.  The liver, gallbladder, spleen and adrenal glands are unremarkable. Small renal cysts are noted. There is no lymphadenopathy. 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  Review of the MIP images confirms the above findings.  IMPRESSION: Status post removal of pigtail catheter from the pelvis. There is a small, new fluid collection in the pelvis measuring 1.7 cm transverse by 4.1 cm AP x 1.6 cm craniocaudal which may represent recurrent abscess.  Mildly dilated loop of small bowel in the pelvis could be due to focal ileus or partial obstruction secondary to adhesions or stricture at a surgical anastomosis.  Very small subcutaneous fluid collection to the right of midline seen on the most recent examination has resolved.  Negative for pulmonary embolus or acute cardiopulmonary disease.  Emphysema.  Avascular necrosis of the femoral heads, worse on the left.   Electronically Signed   By: Drusilla Kanner M.D.   On: 04/26/2013 11:59   Ct Abdomen Pelvis  W Contrast  04/26/2013   ADDENDUM REPORT: 04/26/2013 03:03  ADDENDUM: 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   Electronically Signed   By: Jearld Lesch M.D.   On: 04/26/2013 03:03   04/26/2013   CLINICAL DATA:  Abdominal pain  EXAM: CT ABDOMEN AND PELVIS WITH CONTRAST  TECHNIQUE: Multidetector CT imaging of the abdomen and pelvis was performed using the standard protocol following bolus administration of intravenous contrast.  CONTRAST:  OMNIPAQUE IOHEXOL 300 MG/ML  SOLN  COMPARISON:  04/19/2013  FINDINGS: Lung bases clear.  Normal heart size.  Unremarkable liver, biliary system, spleen, pancreas, adrenal glands. Bilateral too small to further characterize renal hypodensities. Otherwise, symmetric renal enhancement. No hydroureteronephrosis.  No complete bowel obstruction, with contrast reaching the right lower quadrant ostomy. Evidence of partial colectomy. Decrease in size of the fluid collection along the vaginal cuff, though mild complexity and fluid persists. Adjacent small bowel loop is kinked, with proximal dilatation up to 3 cm. And there is a thickened loop within the left lower pelvis on image 56. However, contrast is seen distal to this loop. No free intraperitoneal air or new fluid collection. No lymphadenopathy.  Normal caliber aorta and branch vessels with scattered atherosclerotic disease. Coils within the right upper quadrant.  Partially decompressed bladder. Uterus presumed surgically absent. No adnexal mass.  No acute osseous finding.  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 There is no complete obstruction at this time however there is delayed transit through a segment of bowel in the right pelvis, which may cause partial or intermittent obstruction.  Fluid collection of indeterminate sterility within the pelvis adjacent to the vaginal cuff, however decreased in size in the interval.  Electronically  Signed: By: Jearld Lesch M.D. On: 04/26/2013 02:49   Ct Abdomen Pelvis W Contrast  04/19/2013   CLINICAL DATA:  Chest pain, shortness of breath and abdominal pain.  EXAM: CT ANGIOGRAPHY CHEST  CT ABDOMEN AND PELVIS WITH CONTRAST  TECHNIQUE: Multidetector CT imaging of the chest was performed using the standard protocol during bolus administration of intravenous contrast. Multiplanar CT image reconstructions including MIPs were obtained to evaluate the vascular anatomy. Multidetector CT imaging of the abdomen and pelvis was performed using the standard protocol during bolus administration of intravenous contrast.  CONTRAST:  100 mL OMNIPAQUE IOHEXOL 350 MG/ML SOLN  COMPARISON:  CT abdomen and pelvis 04/03/2013,  03/27/2013 and 03/14/2013. CT chest 12/01/2011.  FINDINGS: CTA CHEST FINDINGS  No pulmonary embolus is identified. Heart size is normal. No pleural or pericardial effusion. No axillary, hilar or mediastinal lymphadenopathy. The lungs demonstrate extensive emphysematous change. There is some scarring in the right upper lobe. The lungs are otherwise clear.  CT ABDOMEN and PELVIS FINDINGS  Again seen and is postoperative change of colectomy with a Hartmann's pouch and right lower quadrant ileostomy. Pigtail catheter in the pelvis seen on the prior examination has been removed. There is a small fluid collection at the former site of the catheter measuring 1.7 cm transverse by 4.1 cm AP by 1.6 cm craniocaudal. Very small subcutaneous fluid collection to the right of midline seen on the most recent examination has resolved.  There is a single mildly dilated loop of small bowel in the right hemipelvis with fecalized contents. This loop measures 2.8 cm in diameter and may be dilated secondary to focal ileus or partial obstruction possibly related to adhesions or surgical anastomosis in the right hemipelvis. Distal small bowel loops are completely decompressed.  The liver, gallbladder, spleen and adrenal glands  are unremarkable. Small renal cysts are noted. There is no lymphadenopathy. 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  Review of the MIP images confirms the above findings.  IMPRESSION: Status post removal of pigtail catheter from the pelvis. There is a small, new fluid collection in the pelvis measuring 1.7 cm transverse by 4.1 cm AP x 1.6 cm craniocaudal which may represent recurrent abscess.  Mildly dilated loop of small bowel in the pelvis could be due to focal ileus or partial obstruction secondary to adhesions or stricture at a surgical anastomosis.  Very small subcutaneous fluid collection to the right of midline seen on the most recent examination has resolved.  Negative for pulmonary embolus or acute cardiopulmonary disease.  Emphysema.  Avascular necrosis of the femoral heads, worse on the left.   Electronically Signed   By: Drusilla Kanner M.D.   On: 04/19/2013 15:08   Ct Abdomen Pelvis W Contrast  04/03/2013   CLINICAL DATA:  FOLLOW UP pelvic drain for clearing of abscess  EXAM: CT ABDOMEN AND PELVIS WITH CONTRAST  TECHNIQUE: Multidetector CT imaging of the abdomen and pelvis was performed using the standard protocol following bolus administration of intravenous contrast.  CONTRAST:  OMNIPAQUE IOHEXOL 300 MG/ML  SOLN  COMPARISON:  03/27/2013.  FINDINGS: There is a trace right pleural effusion with basilar atelectasis. There is a small left pleural effusion.  The liver demonstrates no focal abnormality. There is no intrahepatic or extrahepatic biliary ductal dilatation. The gallbladder is normal. The spleen demonstrates no focal abnormality. The kidneys, adrenal glands and pancreas are normal. The bladder is unremarkable.  There are surgical clips in the epigastric region. The stomach, duodenum, small intestine, and large intestine demonstrate no contrast extravasation or dilatation. There is a right lower quadrant enterostomy. There is a pigtail  catheter present within the pelvis with and near complete resolution of the complex pelvic fluid collection with only a tiny focus remaining measuring approximately 8.6 mm. There is a persistent small fluid collection with rim enhancement in the right upper anterior abdomen measuring 5 x 1 cm on image 38 series 2. This is essentially unchanged from the prior exam. There is a 3.9 x 3.5 cm left periumbilical fluid collection within the subcutaneous fat likely representing a postoperative seroma. There is no pneumoperitoneum, pneumatosis, or portal venous gas. There  is no abdominal or pelvic free fluid. There is no lymphadenopathy.  The abdominal aorta is normal in caliber with atherosclerosis.  There are no lytic or sclerotic osseous lesions.  IMPRESSION: 1. There is a pelvic pigtail drainage catheter present with near complete interval resolution of the complex pelvic fluid collection with only a small focus remaining measuring 8.6 mm.  2. There is a persistent small fluid collection with rim enhancement in the right upper anterior abdomen measuring 5 x 1 cm most concerning for an abscess.  3. Left periumbilical fluid collection within the subcutaneous fat most consistent with a postoperative seroma.   Electronically Signed   By: Elige Ko   On: 04/03/2013 16:50   Dg Chest Port 1 View  04/26/2013   CLINICAL DATA:  Weakness, COPD  EXAM: PORTABLE CHEST - 1 VIEW  COMPARISON:  03/27/2013  FINDINGS: Cardiomediastinal silhouette is stable. No acute infiltrate or pulmonary edema. Extensive degenerative changes right shoulder again noted. Stable bilateral atelectasis or scarring.  IMPRESSION: No active disease. Extensive degenerative changes right shoulder again noted.   Electronically Signed   By: Natasha Mead M.D.   On: 04/26/2013 10:41   Dg Abd Acute W/chest  04/19/2013   CLINICAL DATA:  Abdominal pain  EXAM: ACUTE ABDOMEN SERIES (ABDOMEN 2 VIEW & CHEST 1 VIEW)  COMPARISON:  04/01/2013  FINDINGS: Cardiomediastinal  silhouette is stable. Hyperinflation. There is linear scarring in right midlung. No acute infiltrate or pulmonary edema.  There is nonspecific nonobstructive bowel gas pattern. A colostomy is noted in right lower quadrant. No free abdominal air.  IMPRESSION: Negative abdominal radiographs.  No acute cardiopulmonary disease.   Electronically Signed   By: Natasha Mead M.D.   On: 04/19/2013 09:42   Dg Abd Portable 1v  04/01/2013   CLINICAL DATA:  Abdominal pain.  EXAM: PORTABLE ABDOMEN - 1 VIEW  COMPARISON:  None.  FINDINGS: There is a pelvic percutaneous drainage catheter present. There are embolization coils noted in the mid abdomen. There is no bowel dilatation to suggest obstruction. There is no evidence of pneumoperitoneum, portal venous gas or pneumatosis. There are no pathologic calcifications along the expected course of the ureters.The osseous structures are unremarkable.  IMPRESSION: Percutaneous pelvic drainage catheter in place. Otherwise unremarkable KUB.   Electronically Signed   By: Elige Ko   On: 04/01/2013 10:18    Micro Results      Recent Results (from the past 240 hour(s))  CLOSTRIDIUM DIFFICILE BY PCR     Status: None   Collection Time    04/26/13  9:05 AM      Result Value Range Status   C difficile by pcr NEGATIVE  NEGATIVE Final     History of present illness and  Hospital Course:     Kindly see H&P for history of present illness and admission details, please review complete Labs, Consult reports and Test reports for all details in brief [name], is a 62 y.o. 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 She was treated with supportive  care, she was initially given bowel rest and then she received gradual increments of diet which she has tolerated well without any further nausea vomiting. She was advised to be discharged home by GI physicians in general surgery. She will follow up with her primary gastroenterologist and her general surgeon within one to 2 weeks postdischarge.   For her chronic medical problems she will continue her home medications unchanged. Blood pressure was slightly on the lower side and I will hold her Lopressor for the next few days, as her oral intake improves her blood pressure should come back to her baseline which is low to begin with      Today   Subjective:   [name] today has no headache,no chest abdominal pain,no new weakness tingling or numbness, feels much better wants to go home today.    Objective:   Blood pressure 105/60, pulse 78, temperature 98.5 F (36.9 C), temperature source Oral, resp. rate 18, height 5\' 1"  (1.549 m), weight 50.032 kg (110 lb 4.8 oz), SpO2 98.00%.   Intake/Output Summary (Last 24 hours) at 04/29/13 1224 Last data filed at 04/29/13 1610  Gross per 24 hour  Intake    580 ml  Output      0 ml  Net    580 ml    Exam Awake Alert, Oriented *3, No new F.N deficits, Normal affect [place].AT,PERRAL Supple Neck,No JVD, No cervical lymphadenopathy appriciated.  Symmetrical Chest wall movement, Good air movement bilaterally, CTAB RRR,No Gallops,Rubs or new Murmurs, No Parasternal Heave +ve B.Sounds, Abd Soft, Non tender, No organomegaly appriciated, No rebound -guarding or rigidity. Ileostomy bag and site stable No Cyanosis, Clubbing or edema, No new Rash or bruise  Data Review   CBC w Diff: Lab Results  Component Value Date   WBC 12.6* 04/27/2013   HGB  8.8* 04/27/2013   HCT 26.7* 04/27/2013   PLT 300 04/27/2013   LYMPHOPCT 16 04/25/2013   MONOPCT 6 04/25/2013   EOSPCT 2 04/25/2013   BASOPCT 0 04/25/2013    CMP: Lab Results  Component Value Date   NA 140 04/27/2013   K 3.5 04/27/2013   CL 107 04/27/2013   CO2 24 04/27/2013   BUN 5* 04/27/2013   CREATININE 0.83 04/27/2013   CREATININE 1.04 08/27/2012   PROT 7.7 04/25/2013   ALBUMIN 3.0* 04/25/2013   BILITOT 0.2* 04/25/2013   ALKPHOS 86 04/25/2013   AST 15 04/25/2013   ALT 7 04/25/2013  .   Total Time in preparing paper work, data evaluation and todays exam - 35 minutes  Leroy Sea M.D on 04/29/2013 at 12:24 PM  [facility] Office  [phone]

## 2013-04-29 NOTE — Progress Notes (Signed)
Discharged to home with family office visits in place teaching done  

## 2013-05-04 ENCOUNTER — Other Ambulatory Visit: Payer: Self-pay | Admitting: Gastroenterology

## 2013-05-04 DIAGNOSIS — R634 Abnormal weight loss: Secondary | ICD-10-CM

## 2013-05-04 DIAGNOSIS — R110 Nausea: Secondary | ICD-10-CM

## 2013-05-05 ENCOUNTER — Other Ambulatory Visit: Payer: Medicare Other

## 2013-05-06 ENCOUNTER — Inpatient Hospital Stay (HOSPITAL_COMMUNITY)
Admission: EM | Admit: 2013-05-06 | Discharge: 2013-05-11 | DRG: 388 | Disposition: A | Discharge status: Home Health | Payer: Medicare Other | Attending: General Surgery | Admitting: General Surgery

## 2013-05-06 ENCOUNTER — Encounter (HOSPITAL_COMMUNITY): Payer: Self-pay | Admitting: Emergency Medicine

## 2013-05-06 ENCOUNTER — Emergency Department (HOSPITAL_COMMUNITY): Payer: Medicare Other

## 2013-05-06 DIAGNOSIS — Z7982 Long term (current) use of aspirin: Secondary | ICD-10-CM

## 2013-05-06 DIAGNOSIS — E0590 Thyrotoxicosis, unspecified without thyrotoxic crisis or storm: Secondary | ICD-10-CM | POA: Diagnosis present

## 2013-05-06 DIAGNOSIS — Z681 Body mass index (BMI) 19 or less, adult: Secondary | ICD-10-CM

## 2013-05-06 DIAGNOSIS — E46 Unspecified protein-calorie malnutrition: Secondary | ICD-10-CM | POA: Diagnosis present

## 2013-05-06 DIAGNOSIS — Z932 Ileostomy status: Secondary | ICD-10-CM

## 2013-05-06 DIAGNOSIS — F3289 Other specified depressive episodes: Secondary | ICD-10-CM | POA: Diagnosis present

## 2013-05-06 DIAGNOSIS — J449 Chronic obstructive pulmonary disease, unspecified: Secondary | ICD-10-CM | POA: Diagnosis present

## 2013-05-06 DIAGNOSIS — M069 Rheumatoid arthritis, unspecified: Secondary | ICD-10-CM | POA: Diagnosis present

## 2013-05-06 DIAGNOSIS — F329 Major depressive disorder, single episode, unspecified: Secondary | ICD-10-CM | POA: Diagnosis present

## 2013-05-06 DIAGNOSIS — R634 Abnormal weight loss: Secondary | ICD-10-CM | POA: Diagnosis present

## 2013-05-06 DIAGNOSIS — E43 Unspecified severe protein-calorie malnutrition: Secondary | ICD-10-CM | POA: Diagnosis present

## 2013-05-06 DIAGNOSIS — I10 Essential (primary) hypertension: Secondary | ICD-10-CM | POA: Diagnosis present

## 2013-05-06 DIAGNOSIS — R112 Nausea with vomiting, unspecified: Secondary | ICD-10-CM

## 2013-05-06 DIAGNOSIS — K5000 Crohn's disease of small intestine without complications: Secondary | ICD-10-CM | POA: Diagnosis present

## 2013-05-06 DIAGNOSIS — Z87891 Personal history of nicotine dependence: Secondary | ICD-10-CM

## 2013-05-06 DIAGNOSIS — N739 Female pelvic inflammatory disease, unspecified: Secondary | ICD-10-CM | POA: Diagnosis present

## 2013-05-06 DIAGNOSIS — R109 Unspecified abdominal pain: Secondary | ICD-10-CM

## 2013-05-06 DIAGNOSIS — K565 Intestinal adhesions [bands], unspecified as to partial versus complete obstruction: Principal | ICD-10-CM | POA: Diagnosis present

## 2013-05-06 DIAGNOSIS — K219 Gastro-esophageal reflux disease without esophagitis: Secondary | ICD-10-CM | POA: Diagnosis present

## 2013-05-06 DIAGNOSIS — Z9981 Dependence on supplemental oxygen: Secondary | ICD-10-CM

## 2013-05-06 DIAGNOSIS — E119 Type 2 diabetes mellitus without complications: Secondary | ICD-10-CM | POA: Diagnosis present

## 2013-05-06 DIAGNOSIS — D649 Anemia, unspecified: Secondary | ICD-10-CM | POA: Diagnosis present

## 2013-05-06 DIAGNOSIS — R627 Adult failure to thrive: Secondary | ICD-10-CM | POA: Diagnosis present

## 2013-05-06 DIAGNOSIS — J4489 Other specified chronic obstructive pulmonary disease: Secondary | ICD-10-CM | POA: Diagnosis present

## 2013-05-06 DIAGNOSIS — K651 Peritoneal abscess: Secondary | ICD-10-CM | POA: Diagnosis present

## 2013-05-06 DIAGNOSIS — F411 Generalized anxiety disorder: Secondary | ICD-10-CM | POA: Diagnosis present

## 2013-05-06 DIAGNOSIS — E785 Hyperlipidemia, unspecified: Secondary | ICD-10-CM | POA: Diagnosis present

## 2013-05-06 DIAGNOSIS — M810 Age-related osteoporosis without current pathological fracture: Secondary | ICD-10-CM | POA: Diagnosis present

## 2013-05-06 LAB — COMPREHENSIVE METABOLIC PANEL
ALT: 6 U/L (ref 0–35)
Albumin: 2.7 g/dL — ABNORMAL LOW (ref 3.5–5.2)
Alkaline Phosphatase: 85 U/L (ref 39–117)
BUN: 22 mg/dL (ref 6–23)
Calcium: 9.7 mg/dL (ref 8.4–10.5)
Chloride: 96 mEq/L (ref 96–112)
Potassium: 4.1 mEq/L (ref 3.5–5.1)
Sodium: 133 mEq/L — ABNORMAL LOW (ref 135–145)
Total Bilirubin: 0.3 mg/dL (ref 0.3–1.2)
Total Protein: 7.3 g/dL (ref 6.0–8.3)

## 2013-05-06 LAB — URINALYSIS, ROUTINE W REFLEX MICROSCOPIC
Glucose, UA: NEGATIVE mg/dL
Ketones, ur: 40 mg/dL — AB
Leukocytes, UA: NEGATIVE
Protein, ur: NEGATIVE mg/dL
pH: 6 (ref 5.0–8.0)

## 2013-05-06 LAB — CBC WITH DIFFERENTIAL/PLATELET
Basophils Relative: 0 % (ref 0–1)
Eosinophils Absolute: 0.2 10*3/uL (ref 0.0–0.7)
Hemoglobin: 10 g/dL — ABNORMAL LOW (ref 12.0–15.0)
Lymphocytes Relative: 15 % (ref 12–46)
Lymphs Abs: 1.9 10*3/uL (ref 0.7–4.0)
MCHC: 32.5 g/dL (ref 30.0–36.0)
Monocytes Relative: 8 % (ref 3–12)
Neutro Abs: 10 10*3/uL — ABNORMAL HIGH (ref 1.7–7.7)
Neutrophils Relative %: 75 % (ref 43–77)
RBC: 3.62 MIL/uL — ABNORMAL LOW (ref 3.87–5.11)

## 2013-05-06 LAB — LIPASE, BLOOD: Lipase: 19 U/L (ref 11–59)

## 2013-05-06 LAB — OCCULT BLOOD, POC DEVICE: Fecal Occult Bld: POSITIVE — AB

## 2013-05-06 LAB — MAGNESIUM: Magnesium: 1.6 mg/dL (ref 1.5–2.5)

## 2013-05-06 MED ORDER — SODIUM CHLORIDE 0.9 % IV BOLUS (SEPSIS)
500.0000 mL | Freq: Once | INTRAVENOUS | Status: AC
  Administered 2013-05-06: 500 mL via INTRAVENOUS

## 2013-05-06 MED ORDER — IOHEXOL 300 MG/ML  SOLN
100.0000 mL | Freq: Once | INTRAMUSCULAR | Status: AC | PRN
  Administered 2013-05-06: 100 mL via INTRAVENOUS

## 2013-05-06 MED ORDER — ONDANSETRON HCL 4 MG/2ML IJ SOLN
4.0000 mg | INTRAMUSCULAR | Status: DC | PRN
  Administered 2013-05-07: 4 mg via INTRAVENOUS
  Filled 2013-05-06: qty 2

## 2013-05-06 MED ORDER — ONDANSETRON HCL 4 MG/2ML IJ SOLN
4.0000 mg | Freq: Once | INTRAMUSCULAR | Status: AC
  Administered 2013-05-06: 4 mg via INTRAVENOUS
  Filled 2013-05-06: qty 2

## 2013-05-06 MED ORDER — IOHEXOL 300 MG/ML  SOLN
25.0000 mL | INTRAMUSCULAR | Status: AC
  Administered 2013-05-06: 25 mL via ORAL

## 2013-05-06 MED ORDER — MORPHINE SULFATE 4 MG/ML IJ SOLN
4.0000 mg | INTRAMUSCULAR | Status: DC | PRN
  Administered 2013-05-06 – 2013-05-07 (×2): 4 mg via INTRAVENOUS
  Filled 2013-05-06 (×2): qty 1

## 2013-05-06 MED ORDER — FENTANYL CITRATE 0.05 MG/ML IJ SOLN
50.0000 ug | Freq: Once | INTRAMUSCULAR | Status: AC
Start: 2013-05-06 — End: 2013-05-06
  Administered 2013-05-06: 50 ug via INTRAVENOUS
  Filled 2013-05-06: qty 2

## 2013-05-06 MED ORDER — SODIUM CHLORIDE 0.9 % IV BOLUS (SEPSIS)
1000.0000 mL | Freq: Once | INTRAVENOUS | Status: AC
  Administered 2013-05-06: 1000 mL via INTRAVENOUS

## 2013-05-06 NOTE — ED Provider Notes (Signed)
CSN: [id number]     Arrival date & time 05/06/13  1844 History   First MD Initiated Contact with Patient 05/06/13 1906     Chief Complaint  Patient presents with  . Abdominal Pain  . Nausea   HPI  History provided by the patient and his medical charts. Patient is a 62 year old female with history of Crohn's disease and recent abdominal surgeries for small bowel obstruction and abscess collections who returns to the emergency room with nausea vomiting symptoms, weight loss and abdominal pains. Patient reports having worsening issues with abdominal symptoms and Crohn's disease beginning in early November. She was admitted for small bowel obstruction and stayed in the hospital with surgery for 5 days. She later returned around Thanksgiving with 2 intra-abdominal abscess collections which were surgically drained and improved after a 12 day hospitalization. Since that time patient has had 3 other visits to the emergency room requiring one additional hospitalization for abdominal pains and nausea and vomiting symptoms. She reports having a 20 pound weight loss since the symptoms and surgeries began in November. She has been home from the hospital for the week however her nausea and vomiting symptoms worsened yesterday. Her home medications have not been helping her symptoms. She has not been able to eat or drink. She does have associated abdominal pains which are primary in the left abdomen and moderate to severe at times. They're not as severe as her other recent admissions and surgeries. She denies any changes to the ostomy. No constipation or diarrhea-type stool. She has not seen any blood in the stool. No fever, chills or sweats.    Past Medical History  Diagnosis Date  . Other diseases of vocal cords   . Osteoporosis, unspecified   . Other and unspecified hyperlipidemia   . Esophageal reflux   . Chronic airway obstruction, not elsewhere classified   . Regional enteritis of unspecified site   .  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 Crohn's disease     Ileostomy  . Pelvic adhesions   . Anxiety   . Depression   . Hypertension   . Emphysema   . Hematuria   . Severe cervical dysplasia age 66    cone biopsy with neg paps since  . Pneumonia     "3-4 times; necrotizing once" (04/19/2013)  . Asthmatic bronchitis , chronic   . Exertional shortness of breath   . On home oxygen therapy     "24/7; 2.5L @ rest; 3L when I'm doing my chores" (04/19/2013)  . Type II diabetes mellitus   . Anemia   . History of blood transfusion     "I've had 3 or 4" (04/19/2013)  . Rheumatoid arthritis(714.0) 03/2012    [place]; "mild in my hands" (04/19/2013)   Past Surgical History  Procedure Laterality Date  . Ileostomy  2007  . Ear cyst excision Bilateral 1970's    "lobes"   . Cervical cone biopsy  1978  . Colon surgery  2007    subtotal colectomy with ileostomy  . Esophagogastroduodenoscopy (egd) with esophageal dilation      "3 times, I think" (04/19/2013)  . Oophorectomy      LSO  . Tracheostomy  2010  . Hammer toe surgery Bilateral 1980's  . Balloon dilation  02/10/2012    Procedure: BALLOON DILATION;  Surgeon: Charolett Bumpers, MD;  Location: [facility] ENDOSCOPY;  Service: Endoscopy;  Laterality: N/A;  . Colposcopy    . Flexible sigmoidoscopy  03/26/2012  Procedure: FLEXIBLE SIGMOIDOSCOPY;  Surgeon: Charolett Bumpers, MD;  Location: [facility];  Service: Endoscopy;  Laterality: N/A;  . Cardiac catheterization  07/08/2007    normal  . Nm myocar perf wall motion  09/21/2006    no ischemia  . Ventral hernia repair N/A 03/16/2013    Procedure: REPAIR OF INCISIONAL AND PARASTOMAL HERNIAS;  Surgeon: Mariella Saa, MD;  Location: [facility] OR;  Service: General;  Laterality: N/A;  . Laparotomy N/A 03/16/2013    Procedure: EXPLORATORY LAPAROTOMY;  Surgeon: Mariella Saa, MD;  Location: [facility] OR;  Service: General;  Laterality: N/A;  . Lysis of adhesion N/A 03/16/2013     Procedure: LYSIS OF ADHESIONS FOR SMALL BOWEL OBSTRUCTION;  Surgeon: Mariella Saa, MD;  Location: [facility] OR;  Service: General;  Laterality: N/A;  . Insertion of mesh N/A 03/16/2013    Procedure: INSERTION OF BIOLOGIC MESH;  Surgeon: Mariella Saa, MD;  Location: [facility] OR;  Service: General;  Laterality: N/A;  . Tee without cardioversion N/A 03/21/2013    Procedure: TRANSESOPHAGEAL ECHOCARDIOGRAM (TEE);  Surgeon: Vesta Mixer, MD;  Location: [facility] ENDOSCOPY;  Service: Cardiovascular;  Laterality: N/A;  . Vaginal hysterectomy  1993    [facility]-LSO  . Tracheostomy closure  2010   Family History  Problem Relation Age of Onset  . Osteoporosis Mother   . Kidney disease Mother   . Hypertension Sister   . Stroke Sister   . Diabetes Sister    History  Substance Use Topics  . Smoking status: Former Smoker -- 1.00 packs/day for 32 years    Types: Cigarettes    Quit date: 05/26/2004  . Smokeless tobacco: Never Used  . Alcohol Use: 0.0 oz/week     Comment: 04/19/2013 "have a drink a couple times/yr"   OB History   Grav Para Term Preterm Abortions TAB SAB Ect Mult Living   3 0   3     0     Review of Systems  Constitutional: Positive for appetite change. Negative for fever and diaphoresis.  Respiratory: Negative for cough.   Cardiovascular: Negative for chest pain.  Gastrointestinal: Positive for nausea, vomiting and abdominal pain. Negative for diarrhea, constipation and blood in stool.  Genitourinary: Negative for dysuria, frequency, hematuria and flank pain.  All other systems reviewed and are negative.    Allergies  Esomeprazole magnesium; Shellfish allergy; Surgical lubricant; Other; Peanut-containing drug products; and Levaquin  Home Medications   Current Outpatient Rx  Name  Route  Sig  Dispense  Refill  . arformoterol (BROVANA) 15 MCG/2ML NEBU   Nebulization   Take 15 mcg by nebulization 2 (two) times daily.         [name] aspirin 81 MG tablet   Oral   Take 81 mg by  mouth every Monday, Wednesday, and Friday.          . budesonide (PULMICORT) 0.25 MG/2ML nebulizer solution   Nebulization   Take 0.25 mg by nebulization 2 (two) times daily.         . celecoxib (CELEBREX) 200 MG capsule   Oral   Take 200 mg by mouth daily.         . Cholecalciferol (VITAMIN D) 1000 UNITS capsule   Oral   Take 1,000 Units by mouth daily.          . citalopram (CELEXA) 20 MG tablet   Oral   Take 20 mg by mouth daily.          [name]  diltiazem (CARDIZEM CD) 180 MG 24 hr capsule   Oral   Take 180 mg by mouth 2 (two) times daily.         . famotidine (PEPCID) 20 MG tablet   Oral   Take 20 mg by mouth 2 (two) times daily.         . fluticasone (FLONASE) 50 MCG/ACT nasal spray   Nasal   Place 2 sprays into the nose daily.         . furosemide (LASIX) 20 MG tablet   Oral   Take 20 mg by mouth 2 (two) times daily. Once or twice daily as needed for swelling         . HYDROcodone-acetaminophen (NORCO/VICODIN) 5-325 MG per tablet   Oral   Take 1-2 tablets by mouth every 4 (four) hours as needed for moderate pain.   30 tablet   0   . hydroxychloroquine (PLAQUENIL) 200 MG tablet   Oral   Take 200 mg by mouth Twice daily.          . IRON PO   Oral   Take 1 tablet by mouth every Monday, Wednesday, and Friday.          . Liraglutide (VICTOZA) 18 MG/3ML SOLN   Subcutaneous   Inject 1.8 mg into the skin daily. 1.8 units once daily         . LORazepam (ATIVAN) 0.5 MG tablet   Oral   Take 0.5 mg by mouth every 8 (eight) hours as needed for anxiety.         . metoprolol tartrate (LOPRESSOR) 25 MG tablet   Oral   Take 0.5 tablets (12.5 mg total) by mouth 2 (two) times daily.   30 tablet   9   . Multiple Vitamin (MULTIVITAMIN WITH MINERALS) TABS   Oral   Take 1 tablet by mouth daily.         [name] omeprazole (PRILOSEC) 40 MG capsule   Oral   Take 40 mg by mouth 2 (two) times daily.          . ondansetron (ZOFRAN) 4 MG tablet    Oral   Take 1 tablet (4 mg total) by mouth every 6 (six) hours as needed for nausea.   20 tablet   0   . promethazine (PHENERGAN) 25 MG/ML injection   Intravenous   Inject 0.5 mLs (12.5 mg total) into the vein every 6 (six) hours as needed for nausea or vomiting.   1 mL   0   . Respiratory Therapy Supplies (FLUTTER) DEVI   Inhalation   Inhale 2 application into the lungs every 4 (four) hours.          . rosuvastatin (CRESTOR) 5 MG tablet   Oral   Take 5 mg by mouth every Monday, Wednesday, and Friday.          . tiotropium (SPIRIVA) 18 MCG inhalation capsule   Inhalation   Place 18 mcg into inhaler and inhale daily.          BP 99/77  Pulse 103  Temp(Src) 99.8 F (37.7 C) (Oral)  Resp 20  SpO2 100% Physical Exam  Nursing note and vitals reviewed. Constitutional: She is oriented to person, place, and time. She appears well-developed and well-nourished. No distress.  HENT:  Head: Normocephalic and atraumatic.  Mouth/Throat: Oropharynx is clear and moist.  Cardiovascular: Normal rate and regular rhythm.   Pulmonary/Chest: Effort normal and breath sounds normal. No respiratory distress.  She has no wheezes.  Abdominal: Soft. Bowel sounds are normal. She exhibits no distension and no mass. There is tenderness in the periumbilical area and left upper quadrant. There is guarding. There is no rebound and no CVA tenderness.  There is left periumbilical and left upper and lower lower quadrant pain with slight guarding. Midline surgical scar consistent with history of previous surgeries. Right ostomy clean dry and intact. He normal without erythema or induration.  Musculoskeletal: Normal range of motion.  Neurological: She is alert and oriented to person, place, and time.  Skin: Skin is warm and dry. No rash noted.  Psychiatric: She has a normal mood and affect. Her behavior is normal.    ED Course  Procedures   DIAGNOSTIC STUDIES: Oxygen Saturation is 100% on room  air.  COORDINATION OF CARE:  Nursing notes reviewed. Vital signs reviewed. Initial pt interview and examination performed.   8:13 PM-patient seen and evaluated. Patient appears uncomfortable no acute distress. Vital signs with slight tachycardia and low-grade temperature. Otherwise unremarkable. Patient with tenderness throughout the left abdomen. Right ostomy clean and dry. Discussed work up plan with pt at bedside, which includes lab testing and CT. Pt agrees with plan.  Patient continuing to have waxing and waning pains. No episodes of vomiting. Labs showing slightly elevated WBC. No other significant findings on labs.  11:40 PM spoke with radiologist. CT scan show signs of possible closed loop bowel obstruction. There is also enlarging pelvic complex fluid and abscess. Patient findings discussed with attending physician, Dr. Redgie Grayer.  No plan to consult general surgery.   12:45 AM spoke with Dr. Corliss Skains with general surgery. He recommends hospitalist admission at this time and surgery will consult on the patient. Patient is having output from her ostomy and at this time he feels expanding abscess is the biggest problem which may require another drain placed by IR.  1:00 AM spoke with internal [facility], Dr. Lyda Perone. He is concerned about the CT scan and does not feel the pt is a medical admission.  He feels even without SBO she has failed previous drain and medical antibiotics.  He feels pt should be admitted by gen surgery.   1:20AM  Spoke with Dr. Corliss Skains with gen surg.  He will admit the pt.  No holding orders at this time.   Treatment plan initiated: Medications  ondansetron [facility]) injection 4 mg (4 mg Intravenous Given 05/06/13 1947)  sodium chloride 0.9 % bolus 500 mL (500 mLs Intravenous New Bag/Given 05/06/13 1946)  fentaNYL (SUBLIMAZE) injection 50 mcg (50 mcg Intravenous Given 05/06/13 1946)   Results for orders placed during the hospital encounter of 05/06/13  CBC  WITH DIFFERENTIAL      Result Value Range   WBC 13.2 (*) 4.0 - 10.5 K/uL   RBC 3.62 (*) 3.87 - 5.11 MIL/uL   Hemoglobin 10.0 (*) 12.0 - 15.0 g/dL   HCT 21.3 (*) 08.6 - 57.8 %   MCV 85.1  78.0 - 100.0 fL   MCH 27.6  26.0 - 34.0 pg   MCHC 32.5  30.0 - 36.0 g/dL   RDW 46.9  62.9 - 52.8 %   Platelets 340  150 - 400 K/uL   Neutrophils Relative % 75  43 - 77 %   Neutro Abs 10.0 (*) 1.7 - 7.7 K/uL   Lymphocytes Relative 15  12 - 46 %   Lymphs Abs 1.9  0.7 - 4.0 K/uL   Monocytes Relative 8  3 - 12 %  Monocytes Absolute 1.1 (*) 0.1 - 1.0 K/uL   Eosinophils Relative 2  0 - 5 %   Eosinophils Absolute 0.2  0.0 - 0.7 K/uL   Basophils Relative 0  0 - 1 %   Basophils Absolute 0.0  0.0 - 0.1 K/uL  COMPREHENSIVE METABOLIC PANEL      Result Value Range   Sodium 133 (*) 135 - 145 mEq/L   Potassium 4.1  3.5 - 5.1 mEq/L   Chloride 96  96 - 112 mEq/L   CO2 22  19 - 32 mEq/L   Glucose, Bld 88  70 - 99 mg/dL   BUN 22  6 - 23 mg/dL   Creatinine, Ser 4.54  0.50 - 1.10 mg/dL   Calcium 9.7  8.4 - 09.8 mg/dL   Total Protein 7.3  6.0 - 8.3 g/dL   Albumin 2.7 (*) 3.5 - 5.2 g/dL   AST 11  0 - 37 U/L   ALT 6  0 - 35 U/L   Alkaline Phosphatase 85  39 - 117 U/L   Total Bilirubin 0.3  0.3 - 1.2 mg/dL   GFR calc non Af Amer 60 (*) >90 mL/min   GFR calc Af Amer 70 (*) >90 mL/min  LIPASE, BLOOD      Result Value Range   Lipase 19  11 - 59 U/L  URINALYSIS, ROUTINE W REFLEX MICROSCOPIC      Result Value Range   Color, Urine YELLOW  YELLOW   APPearance CLEAR  CLEAR   Specific Gravity, Urine 1.020  1.005 - 1.030   pH 6.0  5.0 - 8.0   Glucose, UA NEGATIVE  NEGATIVE mg/dL   Hgb urine dipstick NEGATIVE  NEGATIVE   Bilirubin Urine MODERATE (*) NEGATIVE   Ketones, ur 40 (*) NEGATIVE mg/dL   Protein, ur NEGATIVE  NEGATIVE mg/dL   Urobilinogen, UA 0.2  0.0 - 1.0 mg/dL   Nitrite NEGATIVE  NEGATIVE   Leukocytes, UA NEGATIVE  NEGATIVE  MAGNESIUM      Result Value Range   Magnesium 1.6  1.5 - 2.5 mg/dL  CG4  I-STAT (LACTIC ACID)      Result Value Range   Lactic Acid, Venous 0.81  0.5 - 2.2 mmol/L  OCCULT BLOOD, POC DEVICE      Result Value Range   Fecal Occult Bld POSITIVE (*) NEGATIVE       Imaging Review Ct Abdomen Pelvis W Contrast  05/06/2013   CLINICAL DATA:  Abdominal pain, nausea and vomiting.  EXAM: CT ABDOMEN AND PELVIS WITH CONTRAST  TECHNIQUE: Multidetector CT imaging of the abdomen and pelvis was performed using the standard protocol following bolus administration of intravenous contrast.  CONTRAST:  OMNIPAQUE IOHEXOL 300 MG/ML  SOLN  COMPARISON:  Abdominal radiograph performed 04/27/2013, and CT of the abdomen and pelvis performed 04/26/2013  FINDINGS: The visualized lung bases are clear.  There is mild nonspecific prominence of the intrahepatic biliary ducts. The gallbladder is mildly distended and somewhat tortuous, without evidence of significant gallbladder wall thickening or pericholecystic fluid. The common hepatic duct remains normal in caliber, measuring 0.6 cm. The liver is otherwise unremarkable in appearance. The spleen is within normal limits. The pancreas and adrenal glands are unremarkable. Adjacent postoperative change is noted, near the proximal body of the pancreas.  The kidneys are unremarkable in appearance. There is no evidence of hydronephrosis. No renal or ureteral stones are seen. No perinephric stranding is appreciated.  There is dilatation of a relatively long 50 cm  segment of small bowel at the lower abdomen, measuring up to 4.1 cm in diameter. There appears to be twisting of the more proximal and distal loops at the level of the upper pelvis, with significant associated wall thickening and soft tissue inflammation. This clump of inflamed small bowel is directly adjacent to the previously noted complex collection of fluid at the vaginal cuff. The appearance is suspicious for closed loop obstruction; alternatively, it could reflect multiple adhesions, or  obstruction due to inflammation in association with an enlarging complex pelvic abscess.  The complex collection of fluid at the vaginal cuff has increased in size, with multiple septations and peripheral enhancement, measuring approximately 4.0 x 3.6 x 2.7 cm. A small amount of surrounding free fluid is seen.  The remaining visualized small bowel is grossly unremarkable in appearance, with relative decompression of distal small bowel loops. The presence of trace fluid within the distal small bowel suggests that the bowel obstruction is incomplete, increasing the likelihood of obstruction due to pelvic inflammation. The right lower quadrant ileostomy is unremarkable in appearance.  The stomach is within normal limits. No acute vascular abnormalities are seen. Minimal calcification is noted along the abdominal aorta and its branches.  The patient's Hartmann's pouch is decompressed and unremarkable in appearance.  The bladder is decompressed and grossly unremarkable in appearance, though difficult to fully assess. No pelvic sidewall lymphadenopathy is identified. No inguinal lymphadenopathy is seen.  No acute osseous abnormalities are identified.  IMPRESSION: 1. Dilatation of relatively long 50 cm segment of small bowel at the lower abdomen, to 4.1 cm in maximal diameter. There is twisting of the more proximal and distal loops at the level of the upper pelvis, with significant associated ileal wall thickening and soft tissue inflammation. The clump of inflamed small bowel is directly adjacent to the previously noted complex collection of fluid at the vaginal cuff. The complex collection of fluid has increased in size, measuring 4.0 x 3.6 x 2.7 cm, with multiple septations and peripheral enhancement. The appearance is suspicious for closed loop obstruction; alternatively, it could reflect multiple adhesions, or obstruction due to inflammation in association with an enlarging complex pelvic abscess. Trace residual fluid  within the more distal small bowel favors not-quite-complete obstruction due to focal pelvic inflammation. 2. Mild nonspecific prominence of the intrahepatic biliary ducts, with mild gallbladder distention. No evidence of distal obstruction. 3. Small amount of free fluid noted within the pelvis.  These results were called by telephone at the time of interpretation on 05/06/2013 at 11:34 PM to [facility] PA, who verbally acknowledged these results.   Electronically Signed   By: Roanna Raider M.D.   On: 05/06/2013 23:37      MDM   1. Intra-abdominal abscess   2. Abdominal pain   3. Nausea & vomiting        Angus Seller, PA-C 05/07/13 0129

## 2013-05-06 NOTE — ED Notes (Signed)
Pt in CT.

## 2013-05-06 NOTE — ED Notes (Addendum)
Per EMS pt came from home c/o abdominal pain. Pt had abdominal surgery 3 weeks ago for abscesses. Pt has not been able to tolerate PO. Pt extremely nauseous and has vomited 1 in the past 24 hours.  EMS gave 50 mcg of fentanyl which decreased pain from 8/10 to 6/10.

## 2013-05-06 NOTE — ED Provider Notes (Signed)
MSE was initiated and I personally evaluated the patient and placed orders (if any) at  7:09 PM on May 06, 2013.  The patient appears stable so that the remainder of the MSE may be completed by another provider.  Joya Gaskins, MD 05/06/13 [phone]

## 2013-05-06 NOTE — ED Notes (Signed)
Denies nausea, pain 2/10, alert, NAD, calm, interactive, resps e/u, speaking in clear complete sentences, family at [facility], pending CT results. Appropriate sized BP cuff placed on pt, VSS.

## 2013-05-06 NOTE — ED Notes (Signed)
Pt reports abdominal pain increases with drinking contrast. Will encourage pt to drink when pain has improved.

## 2013-05-07 ENCOUNTER — Inpatient Hospital Stay (HOSPITAL_COMMUNITY): Payer: Medicare Other

## 2013-05-07 DIAGNOSIS — R627 Adult failure to thrive: Secondary | ICD-10-CM | POA: Diagnosis present

## 2013-05-07 LAB — GLUCOSE, CAPILLARY
Glucose-Capillary: 74 mg/dL (ref 70–99)
Glucose-Capillary: 75 mg/dL (ref 70–99)
Glucose-Capillary: 98 mg/dL (ref 70–99)
Glucose-Capillary: 139 mg/dL — ABNORMAL HIGH (ref 70–99)

## 2013-05-07 MED ORDER — INSULIN ASPART 100 UNIT/ML ~~LOC~~ SOLN
0.0000 [IU] | SUBCUTANEOUS | Status: DC
  Administered 2013-05-07 – 2013-05-11 (×5): 1 [IU] via SUBCUTANEOUS

## 2013-05-07 MED ORDER — TIOTROPIUM BROMIDE MONOHYDRATE 18 MCG IN CAPS
18.0000 ug | ORAL_CAPSULE | Freq: Every day | RESPIRATORY_TRACT | Status: DC
  Administered 2013-05-08 – 2013-05-11 (×4): 18 ug via RESPIRATORY_TRACT
  Filled 2013-05-07 (×3): qty 5

## 2013-05-07 MED ORDER — ENOXAPARIN SODIUM 30 MG/0.3ML ~~LOC~~ SOLN
30.0000 mg | Freq: Every day | SUBCUTANEOUS | Status: DC
  Administered 2013-05-07 – 2013-05-11 (×5): 30 mg via SUBCUTANEOUS
  Filled 2013-05-07 (×5): qty 0.3

## 2013-05-07 MED ORDER — ONDANSETRON HCL 4 MG/2ML IJ SOLN: 4.0000 mg | Freq: Four times a day (QID) | INTRAMUSCULAR | Status: DC | PRN

## 2013-05-07 MED ORDER — DILTIAZEM HCL ER COATED BEADS 180 MG PO CP24
180.0000 mg | ORAL_CAPSULE | Freq: Two times a day (BID) | ORAL | Status: DC
  Administered 2013-05-08 – 2013-05-11 (×2): 180 mg via ORAL
  Filled 2013-05-07 (×10): qty 1

## 2013-05-07 MED ORDER — DIPHENHYDRAMINE HCL 50 MG/ML IJ SOLN: 12.5000 mg | Freq: Four times a day (QID) | INTRAMUSCULAR | Status: DC | PRN

## 2013-05-07 MED ORDER — FAT EMULSION 20 % IV EMUL
240.0000 mL | INTRAVENOUS | Status: AC
  Administered 2013-05-07: 240 mL via INTRAVENOUS
  Filled 2013-05-07: qty 250

## 2013-05-07 MED ORDER — SODIUM CHLORIDE 0.9 % IV SOLN
1.0000 g | Freq: Every day | INTRAVENOUS | Status: DC
  Administered 2013-05-07 – 2013-05-11 (×5): 1 g via INTRAVENOUS
  Filled 2013-05-07 (×5): qty 1

## 2013-05-07 MED ORDER — KCL IN DEXTROSE-NACL 20-5-0.45 MEQ/L-%-% IV SOLN
INTRAVENOUS | Status: AC
  Administered 2013-05-07 (×2): via INTRAVENOUS
  Filled 2013-05-07 (×2): qty 1000

## 2013-05-07 MED ORDER — METOPROLOL TARTRATE 12.5 MG HALF TABLET
12.5000 mg | ORAL_TABLET | Freq: Two times a day (BID) | ORAL | Status: DC
  Administered 2013-05-07 – 2013-05-08 (×2): 12.5 mg via ORAL
  Filled 2013-05-07 (×9): qty 1

## 2013-05-07 MED ORDER — SODIUM CHLORIDE 0.9 % IJ SOLN
10.0000 mL | INTRAMUSCULAR | Status: DC | PRN
  Administered 2013-05-08 – 2013-05-09 (×3): 10 mL
  Administered 2013-05-11: 20 mL

## 2013-05-07 MED ORDER — HYDROXYCHLOROQUINE SULFATE 200 MG PO TABS
200.0000 mg | ORAL_TABLET | Freq: Two times a day (BID) | ORAL | Status: DC
  Administered 2013-05-07 – 2013-05-11 (×9): 200 mg via ORAL
  Filled 2013-05-07 (×10): qty 1

## 2013-05-07 MED ORDER — PANTOPRAZOLE SODIUM 40 MG IV SOLR
40.0000 mg | Freq: Every day | INTRAVENOUS | Status: DC
  Administered 2013-05-07 – 2013-05-10 (×4): 40 mg via INTRAVENOUS
  Filled 2013-05-07 (×5): qty 40

## 2013-05-07 MED ORDER — DIPHENHYDRAMINE HCL 12.5 MG/5ML PO ELIX: 12.5000 mg | ORAL_SOLUTION | Freq: Four times a day (QID) | ORAL | Status: DC | PRN

## 2013-05-07 MED ORDER — SODIUM CHLORIDE 0.9 % IV SOLN
INTRAVENOUS | Status: AC
  Administered 2013-05-07 – 2013-05-08 (×2): via INTRAVENOUS

## 2013-05-07 MED ORDER — SODIUM CHLORIDE 0.9 % IJ SOLN: 10.0000 mL | Freq: Two times a day (BID) | INTRAMUSCULAR | Status: DC

## 2013-05-07 MED ORDER — ARFORMOTEROL TARTRATE 15 MCG/2ML IN NEBU
15.0000 ug | INHALATION_SOLUTION | Freq: Two times a day (BID) | RESPIRATORY_TRACT | Status: DC
  Administered 2013-05-07 – 2013-05-11 (×8): 15 ug via RESPIRATORY_TRACT
  Filled 2013-05-07 (×12): qty 2

## 2013-05-07 MED ORDER — TRACE MINERALS CR-CU-F-FE-I-MN-MO-SE-ZN IV SOLN
INTRAVENOUS | Status: AC
  Administered 2013-05-07: 18:00:00 via INTRAVENOUS
  Filled 2013-05-07: qty 1000

## 2013-05-07 MED ORDER — BUDESONIDE 0.25 MG/2ML IN SUSP
0.2500 mg | Freq: Two times a day (BID) | RESPIRATORY_TRACT | Status: DC
  Administered 2013-05-07 – 2013-05-11 (×8): 0.25 mg via RESPIRATORY_TRACT
  Filled 2013-05-07 (×11): qty 2

## 2013-05-07 MED ORDER — POTASSIUM CHLORIDE IN NACL 20-0.45 MEQ/L-% IV SOLN
INTRAVENOUS | Status: DC
  Filled 2013-05-07: qty 1000

## 2013-05-07 MED ORDER — CITALOPRAM HYDROBROMIDE 20 MG PO TABS
20.0000 mg | ORAL_TABLET | Freq: Every day | ORAL | Status: DC
  Administered 2013-05-07 – 2013-05-11 (×5): 20 mg via ORAL
  Filled 2013-05-07 (×5): qty 1

## 2013-05-07 MED ORDER — FLUTICASONE PROPIONATE 50 MCG/ACT NA SUSP
2.0000 | Freq: Every day | NASAL | Status: DC
  Administered 2013-05-08 – 2013-05-10 (×3): 2 via NASAL
  Filled 2013-05-07: qty 16

## 2013-05-07 MED ORDER — MORPHINE SULFATE 2 MG/ML IJ SOLN
2.0000 mg | INTRAMUSCULAR | Status: DC | PRN
  Administered 2013-05-07 (×3): 4 mg via INTRAVENOUS
  Filled 2013-05-07 (×3): qty 2

## 2013-05-07 NOTE — Progress Notes (Signed)
PARENTERAL NUTRITION CONSULT NOTE - INITIAL  Pharmacy Consult for TPN Indication: intolerance to enteral feeds, severe weight loss in past few months  Allergies  Allergen Reactions  . Esomeprazole Magnesium Other (See Comments)    NEXIUM - reaction > aggravated pt's Crohn's disease  . Shellfish Allergy Shortness Of Breath and Nausea And Vomiting  . Surgical Lubricant Other (See Comments)    Burns skin   . Other Nausea And Vomiting    Beans, Dander/Dust, Peas, Mushrooms  . Peanut-Containing Drug Products Nausea And Vomiting  . Levaquin [Levofloxacin]     Patient Measurements: Height: 5\' 1"  (154.9 cm) Weight: 82 lb 7.2 oz (37.4 kg) IBW/kg (Calculated) : 47.8 Usual Weight: patient with significant weight loss in past 2 months. She was 110lbs on 12/2 and at that time, RD Maureen Chatters had assessed her as meeting criteria for severe malnutrition. Has lost more weight since that assessment.  Vital Signs: Temp: 98.8 F (37.1 C) (12/13 0401) Temp src: Oral (12/13 0401) BP: 129/76 mmHg (12/13 0401) Pulse Rate: 114 (12/13 0401) Intake/Output from previous day: 12/12 0701 - 12/13 0700 In: 1500 [I.V.:1500] Out: 825 [Urine:450; Stool:375] Intake/Output from this shift:    Labs:  Recent Labs  05/06/13 1924  WBC 13.2*  HGB 10.0*  HCT 30.8*  PLT 340     Recent Labs  05/06/13 1924 05/06/13 2022  NA 133*  --   K 4.1  --   CL 96  --   CO2 22  --   GLUCOSE 88  --   BUN 22  --   CREATININE 0.98  --   CALCIUM 9.7  --   MG  --  1.6  PROT 7.3  --   ALBUMIN 2.7*  --   AST 11  --   ALT 6  --   ALKPHOS 85  --   BILITOT 0.3  --    Estimated Creatinine Clearance: 34.7 ml/min (by C-G formula based on Cr of 0.98).    Recent Labs  05/07/13 0414 05/07/13 0802  GLUCAP 74 75    Medical History: Past Medical History  Diagnosis Date  . Other diseases of vocal cords   . Osteoporosis, unspecified   . Other and unspecified hyperlipidemia   . Esophageal reflux   .  Chronic airway obstruction, not elsewhere classified   . Regional enteritis of unspecified site   . 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 Crohn's disease     Ileostomy  . Pelvic adhesions   . Anxiety   . Depression   . Hypertension   . Emphysema   . Hematuria   . Severe cervical dysplasia age 70    cone biopsy with neg paps since  . Pneumonia     "3-4 times; necrotizing once" (04/19/2013)  . Asthmatic bronchitis , chronic   . Exertional shortness of breath   . On home oxygen therapy     "24/7; 2.5L @ rest; 3L when I'm doing my chores" (04/19/2013)  . Type II diabetes mellitus   . Anemia   . History of blood transfusion     "I've had 3 or 4" (04/19/2013)  . Rheumatoid arthritis(714.0) 03/2012    B[place]ckman; "mild in my hands" (04/19/2013)    Insulin Requirements in the past 24 hours:  0 units from SSI; CBGs in the 70s  Current Nutrition:  NPO; PTA. Was d/c'd 12/5 with recommendations of small, low fiber meals 5-6 times/day.  Currently with D5/half-NS with KCl at 163mL/hr  Nutritional Goals:  1400-1600 kCal, 70-80 grams of protein per day per RD recommendations 12/3.  With new weight, using 1.5-2g/kg/protein and 30-35kcal/kg/day  (both TBW since it is <IBW) goals are 55-75g/day and 1100-1300kcal/day- will update as RD reassess  Assessment: 74 YOF with Crohn's disease S/p ex lap 10/22 with previous ileostomy. Hospitalized 11/2-11/12 due to pelvic abscess which required drain placement. Admitted again 11/25-11/26 and 12/1-12/5 both times with n/v/abdominal pain. Now presenting again with similar complaints.  GI: No evidence of SBO as ileostomy with good output. Complex GI/surgical history (see above).  Endo: hx DM2. A1C on 03/16/13 was 5.6.   Lytes: Na 133, K 4.1, Mag 1.6, no recent phos on file  Renal: SCr 0.98, CrCL ~63mL/min; urine charted so far  Pulm: hx obstructive chronic bronchitis and chronic airway obstruction; 100/2.5L ; on  Spiriva, Pulmicort and Brovana nebs   Cards: hx HTN. BP soft, HR elevated- home diltiazem and Lopressor continued  Hepatobil: albumin low- 2.7, AST/ALT normal- 11/6, alk phos normal- 85, TBili 0.3  Neuro: hx depression and anxiety on Celexa; A&O  ID:  Started on ertapenem 1g IV q24h per MD on 12/13. Blood cultures sent 12/13.  Best Practices: Lovenox for VTE prophylaxis  TPN Access: PICC to be placed 12/13 TPN day#: 0 (starting 12/13 at 1800)  Plan:  1. Start Clinimix E 5/15 at 52mL/hr and lipid emulsion 20% at 56mL/hr as patient is at high risk for refeeding. This will provide 36g protein and 1008kcal. Will utilize multivitamins and full trace elements. 2. Goal rate based on current calculations will be Clinimix E 5/15 at 79mL/hr with lipid 20% emulsion at 14mL/hr- this will provide 60g protein and ~1390kcal/day. Will update goal per RD recommendations, appreciate their assistance. 3. Change IVF to NS 34mL/hr at 1800 tonight when TPN starts 4. TPN labs as ordered 5. Continue CBGs and SSI as ordered 6. Follow up tolerance of TPN, ability to start/tolerate enteral or PO feeding and clinical progression  Katsumi Wisler D. Debralee Braaksma, PharmD, BCPS Clinical Pharmacist Pager: [phone] 05/07/2013 11:22 AM

## 2013-05-07 NOTE — ED Notes (Signed)
Lab at [facility], husband at [facility], no changes.

## 2013-05-07 NOTE — Progress Notes (Signed)
Agree with A&P of ER,NP. Patient again feels better with fluids. Will get UGI,sbft. Marked wight loss, part of which may be fluid, but think it prudent to start TNA

## 2013-05-07 NOTE — Progress Notes (Signed)
Patient transported to 6N14 from Emergency Department.  Patient alert and oriented with VSS.  Patient oriented to unit and call bell within reach.  No complaints of pain, nausea, or SOB at this time.  Patient NPO with CBG: 74.  Dr. Corliss Skains notified and changed IV fluids from 1/2NS-20K to D5-1/2NS-20K.  Will continue to monitor.

## 2013-05-07 NOTE — ED Notes (Signed)
EDPA in to speak with pt & family, updated, pt alert, NAD, calm, interactive, skin W&D, resps e/u, speaking in clear complete sentences.

## 2013-05-07 NOTE — H&P (Signed)
[name] is an 62 y.o. female.   Chief Complaint: nausea, vomiting and abdominal pain  HPI:  [name] is a 62 year old patient of Dr. Johna Sheriff with a history of COPD, diabetes mellitus, anemia, s/p exploratory laparotomy on 03/16/13, previous ileostomy. She was hospitalized 03/27/13-04/06/13 due to a pelvic abscess which required IR drain placement. She was admitted again 11/25-11/26 with n/v and abdominal pain. She was found to have a decreasing intra-abdominal abscess unable to be drained by IR. She improved overnight and was discharged home the following day.  She was readmitted 12/1-12/5/14 for similar symptoms.  She reports the same symptoms which started 3 days ago, nausea and vomiting after she starts to eat. She reports having a hard time swallowing pills as well. Her pain worsens the more she vomits or "dry heaves."  She reports adequate output from her ostomy. She denies fever, chills or sweats. She reports that her appetite has been poor ever since her surgery in October.  She has lost about 20 pounds in the last two months.   Past Medical History  Diagnosis Date  . Other diseases of vocal cords   . Osteoporosis, unspecified   . Other and unspecified hyperlipidemia   . Esophageal reflux   . Chronic airway obstruction, not elsewhere classified   . Regional enteritis of unspecified site   . 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 Crohn's disease     Ileostomy  . Pelvic adhesions   . Anxiety   . Depression   . Hypertension   . Emphysema   . Hematuria   . Severe cervical dysplasia age 80    cone biopsy with neg paps since  . Pneumonia     "3-4 times; necrotizing once" (04/19/2013)  . Asthmatic bronchitis , chronic   . Exertional shortness of breath   . On home oxygen therapy     "24/7; 2.5L @ rest; 3L when I'm doing my chores" (04/19/2013)  . Type II diabetes mellitus   . Anemia   . History of blood transfusion     "I've had 3 or 4"  (04/19/2013)  . Rheumatoid arthritis(714.0) 03/2012    B[place]ckman; "mild in my hands" (04/19/2013)    Past Surgical History  Procedure Laterality Date  . Ileostomy  2007  . Ear cyst excision Bilateral 1970's    "lobes"   . Cervical cone biopsy  1978  . Colon surgery  2007    subtotal colectomy with ileostomy  . Esophagogastroduodenoscopy (egd) with esophageal dilation      "3 times, I think" (04/19/2013)  . Oophorectomy      LSO  . Tracheostomy  2010  . Hammer toe surgery Bilateral 1980's  . Balloon dilation  02/10/2012    Procedure: BALLOON DILATION;  Surgeon: Charolett Bumpers, MD;  Location: [facility] ENDOSCOPY;  Service: Endoscopy;  Laterality: N/A;  . Colposcopy    . Flexible sigmoidoscopy  03/26/2012    Procedure: FLEXIBLE SIGMOIDOSCOPY;  Surgeon: Charolett Bumpers, MD;  Location: [facility] ENDOSCOPY;  Service: Endoscopy;  Laterality: N/A;  . Cardiac catheterization  07/08/2007    normal  . Nm myocar perf wall motion  09/21/2006    no ischemia  . Ventral hernia repair N/A 03/16/2013    Procedure: REPAIR OF INCISIONAL AND PARASTOMAL HERNIAS;  Surgeon: Mariella Saa, MD;  Location: [facility] OR;  Service: General;  Laterality: N/A;  . Laparotomy N/A 03/16/2013    Procedure: EXPLORATORY LAPAROTOMY;  Surgeon: Mariella Saa, MD;  Location: [facility] OR;  Service: General;  Laterality: N/A;  . Lysis of adhesion N/A 03/16/2013    Procedure: LYSIS OF ADHESIONS FOR SMALL BOWEL OBSTRUCTION;  Surgeon: Mariella Saa, MD;  Location: [facility] OR;  Service: General;  Laterality: N/A;  . Insertion of mesh N/A 03/16/2013    Procedure: INSERTION OF BIOLOGIC MESH;  Surgeon: Mariella Saa, MD;  Location: [facility] OR;  Service: General;  Laterality: N/A;  . Tee without cardioversion N/A 03/21/2013    Procedure: TRANSESOPHAGEAL ECHOCARDIOGRAM (TEE);  Surgeon: Vesta Mixer, MD;  Location: [facility] ENDOSCOPY;  Service: Cardiovascular;  Laterality: N/A;  . Vaginal hysterectomy  1993    LAVH-LSO  . Tracheostomy closure   2010    Family History  Problem Relation Age of Onset  . Osteoporosis Mother   . Kidney disease Mother   . Hypertension Sister   . Stroke Sister   . Diabetes Sister    Social History:  reports that she quit smoking about 8 years ago. Her smoking use included Cigarettes. She has a 32 pack-year smoking history. She has never used smokeless tobacco. She reports that she drinks alcohol. She reports that she does not use illicit drugs.  Allergies:  Allergies  Allergen Reactions  . Esomeprazole Magnesium Other (See Comments)    NEXIUM - reaction > aggravated pt's Crohn's disease  . Shellfish Allergy Shortness Of Breath and Nausea And Vomiting  . Surgical Lubricant Other (See Comments)    Burns skin   . Other Nausea And Vomiting    Beans, Dander/Dust, Peas, Mushrooms  . Peanut-Containing Drug Products Nausea And Vomiting  . Levaquin [Levofloxacin]     Prior to Admission medications   Medication Sig Start Date End Date Taking? Authorizing Provider  arformoterol (BROVANA) 15 MCG/2ML NEBU Take 15 mcg by nebulization 2 (two) times daily.   Yes Historical Provider, MD  aspirin 81 MG tablet Take 81 mg by mouth every Monday, Wednesday, and Friday.    Yes Historical Provider, MD  budesonide (PULMICORT) 0.25 MG/2ML nebulizer solution Take 0.25 mg by nebulization 2 (two) times daily.   Yes Historical Provider, MD  celecoxib (CELEBREX) 200 MG capsule Take 200 mg by mouth daily.   Yes Historical Provider, MD  Cholecalciferol (VITAMIN D) 1000 UNITS capsule Take 1,000 Units by mouth daily.    Yes Historical Provider, MD  citalopram (CELEXA) 20 MG tablet Take 20 mg by mouth daily.    Yes Historical Provider, MD  diltiazem (CARDIZEM CD) 180 MG 24 hr capsule Take 180 mg by mouth 2 (two) times daily.   Yes Historical Provider, MD  famotidine (PEPCID) 20 MG tablet Take 20 mg by mouth 2 (two) times daily.   Yes Historical Provider, MD  fluticasone (FLONASE) 50 MCG/ACT nasal spray Place 2 sprays into the  nose daily.   Yes Historical Provider, MD  furosemide (LASIX) 20 MG tablet Take 20 mg by mouth 2 (two) times daily. Once or twice daily as needed for swelling   Yes Historical Provider, MD  HYDROcodone-acetaminophen (NORCO/VICODIN) 5-325 MG per tablet Take 1-2 tablets by mouth every 4 (four) hours as needed for moderate pain. 04/06/13  Yes Megan [name], PA-C  hydroxychloroquine (PLAQUENIL) 200 MG tablet Take 200 mg by mouth Twice daily.  04/20/12  Yes Historical Provider, MD  IRON PO Take 1 tablet by mouth every Monday, Wednesday, and Friday.    Yes Historical Provider, MD  Liraglutide (VICTOZA) 18 MG/3ML SOLN Inject 1.8 mg into the skin daily. 1.8 units once daily  Yes Historical Provider, MD  LORazepam (ATIVAN) 0.5 MG tablet Take 0.5 mg by mouth every 8 (eight) hours as needed for anxiety.   Yes Historical Provider, MD  metoprolol tartrate (LOPRESSOR) 25 MG tablet Take 0.5 tablets (12.5 mg total) by mouth 2 (two) times daily. 05/04/13  Yes Leroy Sea, MD  Multiple Vitamin (MULTIVITAMIN WITH MINERALS) TABS Take 1 tablet by mouth daily.   Yes Historical Provider, MD  omeprazole (PRILOSEC) 40 MG capsule Take 40 mg by mouth 2 (two) times daily.    Yes Historical Provider, MD  ondansetron (ZOFRAN) 4 MG tablet Take 1 tablet (4 mg total) by mouth every 6 (six) hours as needed for nausea. 04/29/13  Yes Leroy Sea, MD  promethazine (PHENERGAN) 25 MG/ML injection Inject 0.5 mLs (12.5 mg total) into the vein every 6 (six) hours as needed for nausea or vomiting. 04/29/13  Yes Leroy Sea, MD  Respiratory Therapy Supplies (FLUTTER) DEVI Inhale 2 application into the lungs every 4 (four) hours.    Yes Historical Provider, MD  rosuvastatin (CRESTOR) 5 MG tablet Take 5 mg by mouth every Monday, Wednesday, and Friday.    Yes Historical Provider, MD  tiotropium (SPIRIVA) 18 MCG inhalation capsule Place 18 mcg into inhaler and inhale daily.   Yes Historical Provider, MD    Results for orders placed  during the hospital encounter of 05/06/13 (from the past 48 hour(s))  CBC WITH DIFFERENTIAL     Status: Abnormal   Collection Time    05/06/13  7:24 PM      Result Value Range   WBC 13.2 (*) 4.0 - 10.5 K/uL   RBC 3.62 (*) 3.87 - 5.11 MIL/uL   Hemoglobin 10.0 (*) 12.0 - 15.0 g/dL   HCT 09.6 (*) 04.5 - 40.9 %   MCV 85.1  78.0 - 100.0 fL   MCH 27.6  26.0 - 34.0 pg   MCHC 32.5  30.0 - 36.0 g/dL   RDW 81.1  91.4 - 78.2 %   Platelets 340  150 - 400 K/uL   Neutrophils Relative % 75  43 - 77 %   Neutro Abs 10.0 (*) 1.7 - 7.7 K/uL   Lymphocytes Relative 15  12 - 46 %   Lymphs Abs 1.9  0.7 - 4.0 K/uL   Monocytes Relative 8  3 - 12 %   Monocytes Absolute 1.1 (*) 0.1 - 1.0 K/uL   Eosinophils Relative 2  0 - 5 %   Eosinophils Absolute 0.2  0.0 - 0.7 K/uL   Basophils Relative 0  0 - 1 %   Basophils Absolute 0.0  0.0 - 0.1 K/uL  COMPREHENSIVE METABOLIC PANEL     Status: Abnormal   Collection Time    05/06/13  7:24 PM      Result Value Range   Sodium 133 (*) 135 - 145 mEq/L   Potassium 4.1  3.5 - 5.1 mEq/L   Chloride 96  96 - 112 mEq/L   CO2 22  19 - 32 mEq/L   Glucose, Bld 88  70 - 99 mg/dL   BUN 22  6 - 23 mg/dL   Creatinine, Ser 9.56  0.50 - 1.10 mg/dL   Calcium 9.7  8.4 - 21.3 mg/dL   Total Protein 7.3  6.0 - 8.3 g/dL   Albumin 2.7 (*) 3.5 - 5.2 g/dL   AST 11  0 - 37 U/L   ALT 6  0 - 35 U/L   Alkaline Phosphatase 85  39 - 117 U/L   Total Bilirubin 0.3  0.3 - 1.2 mg/dL   GFR calc non Af Amer 60 (*) >90 mL/min   GFR calc Af Amer 70 (*) >90 mL/min   Comment: (NOTE)     The eGFR has been calculated using the CKD EPI equation.     This calculation has not been validated in all clinical situations.     eGFR's persistently <90 mL/min signify possible Chronic Kidney     Disease.  LIPASE, BLOOD     Status: None   Collection Time    05/06/13  7:24 PM      Result Value Range   Lipase 19  11 - 59 U/L  CG4 I-STAT (LACTIC ACID)     Status: None   Collection Time    05/06/13  7:44 PM       Result Value Range   Lactic Acid, Venous 0.81  0.5 - 2.2 mmol/L  MAGNESIUM     Status: None   Collection Time    05/06/13  8:22 PM      Result Value Range   Magnesium 1.6  1.5 - 2.5 mg/dL  URINALYSIS, ROUTINE W REFLEX MICROSCOPIC     Status: Abnormal   Collection Time    05/06/13  8:37 PM      Result Value Range   Color, Urine YELLOW  YELLOW   APPearance CLEAR  CLEAR   Specific Gravity, Urine 1.020  1.005 - 1.030   pH 6.0  5.0 - 8.0   Glucose, UA NEGATIVE  NEGATIVE mg/dL   Hgb urine dipstick NEGATIVE  NEGATIVE   Bilirubin Urine MODERATE (*) NEGATIVE   Ketones, ur 40 (*) NEGATIVE mg/dL   Protein, ur NEGATIVE  NEGATIVE mg/dL   Urobilinogen, UA 0.2  0.0 - 1.0 mg/dL   Nitrite NEGATIVE  NEGATIVE   Leukocytes, UA NEGATIVE  NEGATIVE   Comment: MICROSCOPIC NOT DONE ON URINES WITH NEGATIVE PROTEIN, BLOOD, LEUKOCYTES, NITRITE, OR GLUCOSE <1000 mg/dL.  OCCULT BLOOD, POC DEVICE     Status: Abnormal   Collection Time    05/06/13  8:43 PM      Result Value Range   Fecal Occult Bld POSITIVE (*) NEGATIVE  CG4 I-STAT (LACTIC ACID)     Status: None   Collection Time    05/07/13  2:21 AM      Result Value Range   Lactic Acid, Venous 0.52  0.5 - 2.2 mmol/L   Ct Abdomen Pelvis W Contrast  05/06/2013   CLINICAL DATA:  Abdominal pain, nausea and vomiting.  EXAM: CT ABDOMEN AND PELVIS WITH CONTRAST  TECHNIQUE: Multidetector CT imaging of the abdomen and pelvis was performed using the standard protocol following bolus administration of intravenous contrast.  CONTRAST:  OMNIPAQUE IOHEXOL 300 MG/ML  SOLN  COMPARISON:  Abdominal radiograph performed 04/27/2013, and CT of the abdomen and pelvis performed 04/26/2013  FINDINGS: The visualized lung bases are clear.  There is mild nonspecific prominence of the intrahepatic biliary ducts. The gallbladder is mildly distended and somewhat tortuous, without evidence of significant gallbladder wall thickening or pericholecystic fluid. The common hepatic duct  remains normal in caliber, measuring 0.6 cm. The liver is otherwise unremarkable in appearance. The spleen is within normal limits. The pancreas and adrenal glands are unremarkable. Adjacent postoperative change is noted, near the proximal body of the pancreas.  The kidneys are unremarkable in appearance. There is no evidence of hydronephrosis. No renal or ureteral stones are seen. No perinephric stranding is  appreciated.  There is dilatation of a relatively long 50 cm segment of small bowel at the lower abdomen, measuring up to 4.1 cm in diameter. There appears to be twisting of the more proximal and distal loops at the level of the upper pelvis, with significant associated wall thickening and soft tissue inflammation. This clump of inflamed small bowel is directly adjacent to the previously noted complex collection of fluid at the vaginal cuff. The appearance is suspicious for closed loop obstruction; alternatively, it could reflect multiple adhesions, or obstruction due to inflammation in association with an enlarging complex pelvic abscess.  The complex collection of fluid at the vaginal cuff has increased in size, with multiple septations and peripheral enhancement, measuring approximately 4.0 x 3.6 x 2.7 cm. A small amount of surrounding free fluid is seen.  The remaining visualized small bowel is grossly unremarkable in appearance, with relative decompression of distal small bowel loops. The presence of trace fluid within the distal small bowel suggests that the bowel obstruction is incomplete, increasing the likelihood of obstruction due to pelvic inflammation. The right lower quadrant ileostomy is unremarkable in appearance.  The stomach is within normal limits. No acute vascular abnormalities are seen. Minimal calcification is noted along the abdominal aorta and its branches.  The patient's Hartmann's pouch is decompressed and unremarkable in appearance.  The bladder is decompressed and grossly  unremarkable in appearance, though difficult to fully assess. No pelvic sidewall lymphadenopathy is identified. No inguinal lymphadenopathy is seen.  No acute osseous abnormalities are identified.  IMPRESSION: 1. Dilatation of relatively long 50 cm segment of small bowel at the lower abdomen, to 4.1 cm in maximal diameter. There is twisting of the more proximal and distal loops at the level of the upper pelvis, with significant associated ileal wall thickening and soft tissue inflammation. The clump of inflamed small bowel is directly adjacent to the previously noted complex collection of fluid at the vaginal cuff. The complex collection of fluid has increased in size, measuring 4.0 x 3.6 x 2.7 cm, with multiple septations and peripheral enhancement. The appearance is suspicious for closed loop obstruction; alternatively, it could reflect multiple adhesions, or obstruction due to inflammation in association with an enlarging complex pelvic abscess. Trace residual fluid within the more distal small bowel favors not-quite-complete obstruction due to focal pelvic inflammation. 2. Mild nonspecific prominence of the intrahepatic biliary ducts, with mild gallbladder distention. No evidence of distal obstruction. 3. Small amount of free fluid noted within the pelvis.  These results were called by telephone at the time of interpretation on 05/06/2013 at 11:34 PM to [facility] PA, who verbally acknowledged these results.   Electronically Signed   By: Roanna Raider M.D.   On: 05/06/2013 23:37    Review of Systems  Constitutional: Negative for weight loss.  HENT: Negative for ear discharge, ear pain, hearing loss and tinnitus.   Eyes: Negative for blurred vision, double vision, photophobia and pain.  Respiratory: Negative for cough, sputum production and shortness of breath.   Cardiovascular: Negative for chest pain.  Gastrointestinal: Positive for nausea, vomiting and abdominal pain.  Genitourinary: Negative for  dysuria, urgency, frequency and flank pain.  Musculoskeletal: Negative for back pain, falls, joint pain, myalgias and neck pain.  Neurological: Negative for dizziness, tingling, sensory change, focal weakness, loss of consciousness and headaches.  Endo/Heme/Allergies: Does not bruise/bleed easily.  Psychiatric/Behavioral: Negative for depression, memory loss and substance abuse. The patient is not nervous/anxious.     Blood pressure 107/65, pulse  112, temperature 98.8 F (37.1 C), temperature source Oral, resp. rate 20, SpO2 100.00%. Physical Exam  WDWN in NAD HEENT:  EOMI, sclera anicteric Neck:  No masses, no thyromegaly Lungs:  CTA bilaterally; normal respiratory effort CV:  Regular rate and rhythm; no murmurs Abd:  +bowel sounds, mildly distended, healed midline incision with no obvious hernia RLQ ileostomy site with liquid stool in bag Mild diffuse tenderness throughout the midline Ext:  Well-perfused; no edema Skin:  Warm, dry; no sign of jaundice  Assessment/Plan 1.  Nausea/ vomiting - probable functional instead of mechanical gastric obstruction 2.  No clinical signs of SBO, as her ileostomy continues to function well.  SB thickening on CT scan likely secondary to 4 cm complex fluid collection  Will readmit for IV hydration Will place PICC line and start TNA for nutrition May need upper GI to evaluate gastric function Will check with IR to see if fluid collection is amenable to percutaneous drainage.  Previously, they did not see a clear path to the fluid collection, so this is unlikely. Will start IV antibiotics Reexploration at this time would be fraught with the risk of complications.  Madeliene Tejera K. 05/07/2013, 2:48 AM

## 2013-05-07 NOTE — ED Provider Notes (Signed)
Medical screening examination/treatment/procedure(s) were performed by non-physician practitioner and as supervising physician I was immediately available for consultation/collaboration.  EKG Interpretation    Date/Time:    Ventricular Rate:    PR Interval:    QRS Duration:   QT Interval:    QTC Calculation:   R Axis:     Text Interpretation:                Tyrick Dunagan, MD 05/07/13 1213 

## 2013-05-07 NOTE — Progress Notes (Signed)
Peripherally Inserted Central Catheter/Midline Placement  The IV Nurse has discussed with the patient and/or persons authorized to consent for the patient, the purpose of this procedure and the potential benefits and risks involved with this procedure.  The benefits include less needle sticks, lab draws from the catheter and patient may be discharged home with the catheter.  Risks include, but not limited to, infection, bleeding, blood clot (thrombus formation), and puncture of an artery; nerve damage and irregular heat beat.  Alternatives to this procedure were also discussed.  PICC/Midline Placement Documentation  PICC / Midline Double Lumen 05/07/13 PICC Right Basilic 43 cm 2 cm (Active)  Indication for Insertion or Continuance of Line Administration of hyperosmolar/irritating solutions (i.e. TPN, Vancomycin, etc.) 05/07/2013 11:55 AM  Exposed Catheter (cm) 0 cm 05/07/2013 11:55 AM  Site Assessment Clean;Dry;Intact 05/07/2013 11:55 AM  Lumen #1 Status Flushed;Saline locked;Blood return noted 05/07/2013 11:55 AM  Lumen #2 Status Flushed;Saline locked;Blood return noted 05/07/2013 11:55 AM  Dressing Type Transparent 05/07/2013 11:55 AM  Dressing Status Clean;Dry;Intact;Antimicrobial disc in place 05/07/2013 11:55 AM  Dressing Intervention New dressing 05/07/2013 11:55 AM  Dressing Change Due 05/14/13 05/07/2013 11:55 AM       Ethelda Chick 05/07/2013, 12:23 PM

## 2013-05-07 NOTE — Progress Notes (Signed)
Subjective: NPO, no longer vomiting.  Asking for liquids.  PICC line placed today.    Objective: Vital signs in last 24 hours: Temp:  [98.8 F (37.1 C)-99.8 F (37.7 C)] 99 F (37.2 C) (12/13 1039) Pulse Rate:  [100-114] 105 (12/13 1039) Resp:  [16-20] 18 (12/13 1039) BP: (84-129)/(44-105) 91/60 mmHg (12/13 1039) SpO2:  [97 %-100 %] 100 % (12/13 1039) Weight:  [82 lb 7.2 oz (37.4 kg)] 82 lb 7.2 oz (37.4 kg) (12/13 0401) Last BM Date: 05/06/13  Intake/Output from previous day: 12/12 0701 - 12/13 0700 In: 1500 [I.V.:1500] Out: 825 [Urine:450; Stool:375] Intake/Output this shift:   PE General appearance: alert, cooperative and no distress Resp: clear to auscultation bilaterally Cardio: regular rate and rhythm, S1, S2 normal, no murmur, click, rub or gallop GI: diffuse mild ttp, no evidence of peritonitis.  +bs, ileostomy with liquid stool.  Extremities: extremities normal, atraumatic, no cyanosis or edema  Lab Results:   Recent Labs  05/06/13 1924  WBC 13.2*  HGB 10.0*  HCT 30.8*  PLT 340   BMET  Recent Labs  05/06/13 1924  NA 133*  K 4.1  CL 96  CO2 22  GLUCOSE 88  BUN 22  CREATININE 0.98  CALCIUM 9.7   PT/INR No results found for this basename: LABPROT, INR,  in the last 72 hours ABG No results found for this basename: PHART, PCO2, PO2, HCO3,  in the last 72 hours  Studies/Results: Ct Abdomen Pelvis W Contrast  05/06/2013   CLINICAL DATA:  Abdominal pain, nausea and vomiting.  EXAM: CT ABDOMEN AND PELVIS WITH CONTRAST  TECHNIQUE: Multidetector CT imaging of the abdomen and pelvis was performed using the standard protocol following bolus administration of intravenous contrast.  CONTRAST:  OMNIPAQUE IOHEXOL 300 MG/ML  SOLN  COMPARISON:  Abdominal radiograph performed 04/27/2013, and CT of the abdomen and pelvis performed 04/26/2013  FINDINGS: The visualized lung bases are clear.  There is mild nonspecific prominence of the intrahepatic biliary  ducts. The gallbladder is mildly distended and somewhat tortuous, without evidence of significant gallbladder wall thickening or pericholecystic fluid. The common hepatic duct remains normal in caliber, measuring 0.6 cm. The liver is otherwise unremarkable in appearance. The spleen is within normal limits. The pancreas and adrenal glands are unremarkable. Adjacent postoperative change is noted, near the proximal body of the pancreas.  The kidneys are unremarkable in appearance. There is no evidence of hydronephrosis. No renal or ureteral stones are seen. No perinephric stranding is appreciated.  There is dilatation of a relatively long 50 cm segment of small bowel at the lower abdomen, measuring up to 4.1 cm in diameter. There appears to be twisting of the more proximal and distal loops at the level of the upper pelvis, with significant associated wall thickening and soft tissue inflammation. This clump of inflamed small bowel is directly adjacent to the previously noted complex collection of fluid at the vaginal cuff. The appearance is suspicious for closed loop obstruction; alternatively, it could reflect multiple adhesions, or obstruction due to inflammation in association with an enlarging complex pelvic abscess.  The complex collection of fluid at the vaginal cuff has increased in size, with multiple septations and peripheral enhancement, measuring approximately 4.0 x 3.6 x 2.7 cm. A small amount of surrounding free fluid is seen.  The remaining visualized small bowel is grossly unremarkable in appearance, with relative decompression of distal small bowel loops. The presence of trace fluid within the distal small bowel suggests that the bowel  obstruction is incomplete, increasing the likelihood of obstruction due to pelvic inflammation. The right lower quadrant ileostomy is unremarkable in appearance.  The stomach is within normal limits. No acute vascular abnormalities are seen. Minimal calcification is noted  along the abdominal aorta and its branches.  The patient's Hartmann's pouch is decompressed and unremarkable in appearance.  The bladder is decompressed and grossly unremarkable in appearance, though difficult to fully assess. No pelvic sidewall lymphadenopathy is identified. No inguinal lymphadenopathy is seen.  No acute osseous abnormalities are identified.  IMPRESSION: 1. Dilatation of relatively long 50 cm segment of small bowel at the lower abdomen, to 4.1 cm in maximal diameter. There is twisting of the more proximal and distal loops at the level of the upper pelvis, with significant associated ileal wall thickening and soft tissue inflammation. The clump of inflamed small bowel is directly adjacent to the previously noted complex collection of fluid at the vaginal cuff. The complex collection of fluid has increased in size, measuring 4.0 x 3.6 x 2.7 cm, with multiple septations and peripheral enhancement. The appearance is suspicious for closed loop obstruction; alternatively, it could reflect multiple adhesions, or obstruction due to inflammation in association with an enlarging complex pelvic abscess. Trace residual fluid within the more distal small bowel favors not-quite-complete obstruction due to focal pelvic inflammation. 2. Mild nonspecific prominence of the intrahepatic biliary ducts, with mild gallbladder distention. No evidence of distal obstruction. 3. Small amount of free fluid noted within the pelvis.  These results were called by telephone at the time of interpretation on 05/06/2013 at 11:34 PM to [facility] PA, who verbally acknowledged these results.   Electronically Signed   By: Roanna Raider M.D.   On: 05/06/2013 23:37    Anti-infectives: Anti-infectives   Start     Dose/Rate Route Frequency Ordered Stop   05/07/13 1000  hydroxychloroquine (PLAQUENIL) tablet 200 mg     200 mg Oral 2 times daily 05/07/13 0409     05/07/13 0500  ertapenem (INVANZ) 1 g in sodium chloride 0.9 % 50 mL  IVPB     1 g 100 mL/hr over 30 Minutes Intravenous Daily 05/07/13 0409        Assessment/Plan: 1. Nausea/ vomiting - probable functional instead of mechanical gastric obstruction  2. No clinical signs of SBO, as her ileostomy continues to function well. SB thickening on CT scan likely secondary to 4 cm complex fluid collection  Start TPN Proceed with UGI with small bowel follow through on Monday morning May have clears Continue with invanz Limit pain medication Consider IR consult to evaluate fluid collection     LOS: 1 day    Reham Slabaugh ANP-BC 05/07/2013 12:56 PM

## 2013-05-08 DIAGNOSIS — R627 Adult failure to thrive: Secondary | ICD-10-CM

## 2013-05-08 DIAGNOSIS — E46 Unspecified protein-calorie malnutrition: Secondary | ICD-10-CM

## 2013-05-08 LAB — COMPREHENSIVE METABOLIC PANEL
AST: 8 U/L (ref 0–37)
Albumin: 2.1 g/dL — ABNORMAL LOW (ref 3.5–5.2)
Alkaline Phosphatase: 68 U/L (ref 39–117)
BUN: 6 mg/dL (ref 6–23)
Calcium: 8.5 mg/dL (ref 8.4–10.5)
Creatinine, Ser: 0.73 mg/dL (ref 0.50–1.10)
GFR calc Af Amer: >90 mL/min (ref >90)
GFR calc non Af Amer: 89 mL/min — ABNORMAL LOW (ref >90)

## 2013-05-08 LAB — GLUCOSE, CAPILLARY
Glucose-Capillary: 113 mg/dL — ABNORMAL HIGH (ref 70–99)
Glucose-Capillary: 125 mg/dL — ABNORMAL HIGH (ref 70–99)
Glucose-Capillary: 110 mg/dL — ABNORMAL HIGH (ref 70–99)
Glucose-Capillary: 110 mg/dL — ABNORMAL HIGH (ref 70–99)

## 2013-05-08 LAB — CBC
Hemoglobin: 7.8 g/dL — ABNORMAL LOW (ref 12.0–15.0)
MCHC: 31.8 g/dL (ref 30.0–36.0)
MCV: 85.4 fL (ref 78.0–100.0)
Platelets: 257 10*3/uL (ref 150–400)
RDW: 14.3 % (ref 11.5–15.5)

## 2013-05-08 LAB — MAGNESIUM: Magnesium: 1.4 mg/dL — ABNORMAL LOW (ref 1.5–2.5)

## 2013-05-08 LAB — DIFFERENTIAL
Basophils Absolute: 0 10*3/uL (ref 0.0–0.1)
Basophils Relative: 0 % (ref 0–1)
Eosinophils Absolute: 0.3 10*3/uL (ref 0.0–0.7)
Monocytes Absolute: 1 10*3/uL (ref 0.1–1.0)
Monocytes Relative: 10 % (ref 3–12)
Neutro Abs: 6.2 10*3/uL (ref 1.7–7.7)
Neutrophils Relative %: 66 % (ref 43–77)

## 2013-05-08 LAB — TRIGLYCERIDES: Triglycerides: 94 mg/dL (ref <150)

## 2013-05-08 LAB — PREALBUMIN: Prealbumin: 6.6 mg/dL — ABNORMAL LOW (ref 17.0–34.0)

## 2013-05-08 LAB — PHOSPHORUS: Phosphorus: 3 mg/dL (ref 2.3–4.6)

## 2013-05-08 MED ORDER — FENTANYL CITRATE 0.05 MG/ML IJ SOLN
50.0000 ug | INTRAMUSCULAR | Status: DC | PRN
  Administered 2013-05-08 – 2013-05-11 (×10): 50 ug via INTRAVENOUS
  Filled 2013-05-08 (×11): qty 2

## 2013-05-08 MED ORDER — ACETAMINOPHEN 325 MG PO TABS: 325.0000 mg | ORAL_TABLET | Freq: Four times a day (QID) | ORAL | Status: DC | PRN

## 2013-05-08 MED ORDER — FENTANYL CITRATE 0.05 MG/ML IJ SOLN
12.5000 ug | INTRAMUSCULAR | Status: DC | PRN
  Administered 2013-05-08 (×2): 12.5 ug via INTRAVENOUS
  Filled 2013-05-08 (×2): qty 2

## 2013-05-08 MED ORDER — MAGNESIUM SULFATE 40 MG/ML IJ SOLN
2.0000 g | Freq: Once | INTRAMUSCULAR | Status: AC
  Administered 2013-05-08: 2 g via INTRAVENOUS
  Filled 2013-05-08: qty 50

## 2013-05-08 MED ORDER — POTASSIUM CHLORIDE 10 MEQ/50ML IV SOLN
10.0000 meq | INTRAVENOUS | Status: AC
  Administered 2013-05-08 (×4): 10 meq via INTRAVENOUS
  Filled 2013-05-08 (×4): qty 50

## 2013-05-08 MED ORDER — TRACE MINERALS CR-CU-F-FE-I-MN-MO-SE-ZN IV SOLN
INTRAVENOUS | Status: AC
  Administered 2013-05-08: 17:00:00 via INTRAVENOUS
  Filled 2013-05-08: qty 1000

## 2013-05-08 MED ORDER — FAT EMULSION 20 % IV EMUL
240.0000 mL | INTRAVENOUS | Status: AC
  Administered 2013-05-08: 240 mL via INTRAVENOUS
  Filled 2013-05-08: qty 250

## 2013-05-08 NOTE — Progress Notes (Signed)
Agree with above. Will work up swallowing/ gastric function with UGI  Wilmon Arms. Corliss Skains, MD, [facility]  General/ Trauma Surgery  05/08/2013 11:49 AM

## 2013-05-08 NOTE — Progress Notes (Signed)
PARENTERAL NUTRITION CONSULT NOTE  Pharmacy Consult for TPN Indication: intolerance to enteral feeds, severe weight loss in past few months  Allergies  Allergen Reactions  . Esomeprazole Magnesium Other (See Comments)    NEXIUM - reaction > aggravated pt's Crohn's disease  . Shellfish Allergy Shortness Of Breath and Nausea And Vomiting  . Surgical Lubricant Other (See Comments)    Burns skin   . Other Nausea And Vomiting    Beans, Dander/Dust, Peas, Mushrooms  . Peanut-Containing Drug Products Nausea And Vomiting  . Levaquin [Levofloxacin]     Patient Measurements: Height: 5\' 1"  (154.9 cm) Weight: 98 lb 1.7 oz (44.5 kg) IBW/kg (Calculated) : 47.8 Usual Weight: patient with significant weight loss in past 2 months. She was 110lbs on 12/2 and at that time, RD Maureen Chatters had assessed her as meeting criteria for severe malnutrition. Has lost more weight since that assessment. 12/13 weight on admit was 37kg, 12/14 weight is 44.5kg- doubt she gained that overnight. Likely difference in bed scales.  Vital Signs: Temp: 98.7 F (37.1 C) (12/14 0500) Temp src: Oral (12/14 0500) BP: 79/21 mmHg (12/14 0500) Pulse Rate: 95 (12/14 0500) Intake/Output from previous day: 12/13 0701 - 12/14 0700 In: 2782.3 [I.V.:2278.8; TPN:503.5] Out: 300 [Urine:300] Intake/Output from this shift:    Labs:  Recent Labs  05/06/13 1924 05/08/13 0445  WBC 13.2* 9.4  HGB 10.0* 7.8*  HCT 30.8* 24.5*  PLT 340 257     Recent Labs  05/06/13 1924 05/06/13 2022 05/08/13 0445  NA 133*  --  137  K 4.1  --  3.5  CL 96  --  105  CO2 22  --  24  GLUCOSE 88  --  107*  BUN 22  --  6  CREATININE 0.98  --  0.73  CALCIUM 9.7  --  8.5  MG  --  1.6 1.4*  PHOS  --   --  3.0  PROT 7.3  --  5.6*  ALBUMIN 2.7*  --  2.1*  AST 11  --  8  ALT 6  --  5  ALKPHOS 85  --  68  BILITOT 0.3  --  0.2*  TRIG  --   --  94   Estimated Creatinine Clearance: 50.6 ml/min (by C-G formula based on Cr of 0.73).     Recent Labs  05/07/13 2353 05/08/13 0400 05/08/13 0753  GLUCAP 139* 113* 125*    Insulin Requirements in the past 24 hours:  3 units from SSI; CBGs 98-139  Current Nutrition:  Continue Clinimix E 5/15 at 85mL/hr and lipid emulsion 20% at 71mL/hr. This provides 36g protein and 1008kcal.  Nutritional Goals:  1400-1600 kCal, 70-80 grams of protein per day per RD recommendations 12/3.  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 Hospitalized 11/2-11/12 due to pelvic abscess which required drain placement. Admitted again 11/25-11/26 and 12/1-12/5 both times with n/v/abdominal pain. Now presenting again with similar complaints.  GI: No evidence of SBO as ileostomy with good output. Complex GI/surgical history (see above). Allowed clears. UGI with small bowel follow through scheduled for tomorrow morning. On IV PPI.  Endo: hx DM2. 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 BP soft, HR elevated- home diltiazem and Lopressor continued  Hepatobil: albumin low- 2.1, AST/ALT normal- 8/5, alk phos normal- 68, TBili 0.2, trigs 94, prealbumin drawn this morning- in process  Neuro: hx depression and anxiety on Celexa; A&O  ID:  Started on ertapenem 1g IV q24h per MD on 12/13. Blood cultures sent 12/13.  Best Practices: Lovenox for VTE prophylaxis  TPN Access: double lumen PICC placed 12/13 TPN day#: 1  Plan:  1. Continue Clinimix E 5/15 at 68mL/hr and lipid emulsion 20% at 25mL/hr as patient is at high risk  for refeeding and is exhibiting some signs of refeeding with drop in K and Mag. This will provide 36g protein and 1008kcal. Will utilize multivitamins and full trace elements. 2. Goal rate based on current calculations will be Clinimix E 5/15 at 54mL/hr with lipid 20% emulsion at 70mL/hr- this will provide 60g protein and ~1390kcal/day. Will update goal per RD recommendations, appreciate their assistance. 3. Continue NS 28mL/hr 4. TPN labs as ordered 5. Continue CBGs and SSI as ordered 6. Follow up tolerance of TPN, ability to start/tolerate enteral or PO feeding and clinical progression 7. Replete magnesium with 2g IV x1 8. Replete potassium with potassium chloride IV x4  [facility] D. Nyal Schachter, PharmD, BCPS Clinical Pharmacist Pager: [phone] 05/08/2013 8:40 AM

## 2013-05-08 NOTE — Progress Notes (Signed)
Subjective: No n/v, increased abd pain when she ate clears.  Ostomy with good output.  TPN started.  Admission weight appears to be wrong, she's 98lbs today still 12lb weight loss past few weeks.    Objective: Vital signs in last 24 hours: Temp:  [98.7 F (37.1 C)-99.7 F (37.6 C)] 98.7 F (37.1 C) (12/14 0500) Pulse Rate:  [95-100] 96 (12/14 0942) Resp:  [14-18] 16 (12/14 0942) BP: (79-97)/(21-56) 79/21 mmHg (12/14 0500) SpO2:  [100 %] 100 % (12/14 0500) Weight:  [98 lb 1.7 oz (44.5 kg)] 98 lb 1.7 oz (44.5 kg) (12/14 0500) Last BM Date: 05/08/13  Intake/Output from previous day: 12/13 0701 - 12/14 0700 In: 2782.3 [I.V.:2278.8; TPN:503.5] Out: 300 [Urine:300] Intake/Output this shift:    PE  General appearance: alert, cooperative and no distress  Resp: clear to auscultation bilaterally  Cardio: regular rate and rhythm, S1, S2 normal, no murmur, click, rub or gallop  GI: diffuse mild ttp, no evidence of peritonitis. +bs, ileostomy with liquid stool.  Extremities: extremities normal, atraumatic, no cyanosis or edema   Lab Results:   Recent Labs  05/06/13 1924 05/08/13 0445  WBC 13.2* 9.4  HGB 10.0* 7.8*  HCT 30.8* 24.5*  PLT 340 257   BMET  Recent Labs  05/06/13 1924 05/08/13 0445  NA 133* 137  K 4.1 3.5  CL 96 105  CO2 22 24  GLUCOSE 88 107*  BUN 22 6  CREATININE 0.98 0.73  CALCIUM 9.7 8.5   PT/INR No results found for this basename: LABPROT, INR,  in the last 72 hours ABG No results found for this basename: PHART, PCO2, PO2, HCO3,  in the last 72 hours  Studies/Results: Ct Abdomen Pelvis W Contrast  05/06/2013   CLINICAL DATA:  Abdominal pain, nausea and vomiting.  EXAM: CT ABDOMEN AND PELVIS WITH CONTRAST  TECHNIQUE: Multidetector CT imaging of the abdomen and pelvis was performed using the standard protocol following bolus administration of intravenous contrast.  CONTRAST:  OMNIPAQUE IOHEXOL 300 MG/ML  SOLN  COMPARISON:  Abdominal  radiograph performed 04/27/2013, and CT of the abdomen and pelvis performed 04/26/2013  FINDINGS: The visualized lung bases are clear.  There is mild nonspecific prominence of the intrahepatic biliary ducts. The gallbladder is mildly distended and somewhat tortuous, without evidence of significant gallbladder wall thickening or pericholecystic fluid. The common hepatic duct remains normal in caliber, measuring 0.6 cm. The liver is otherwise unremarkable in appearance. The spleen is within normal limits. The pancreas and adrenal glands are unremarkable. Adjacent postoperative change is noted, near the proximal body of the pancreas.  The kidneys are unremarkable in appearance. There is no evidence of hydronephrosis. No renal or ureteral stones are seen. No perinephric stranding is appreciated.  There is dilatation of a relatively long 50 cm segment of small bowel at the lower abdomen, measuring up to 4.1 cm in diameter. There appears to be twisting of the more proximal and distal loops at the level of the upper pelvis, with significant associated wall thickening and soft tissue inflammation. This clump of inflamed small bowel is directly adjacent to the previously noted complex collection of fluid at the vaginal cuff. The appearance is suspicious for closed loop obstruction; alternatively, it could reflect multiple adhesions, or obstruction due to inflammation in association with an enlarging complex pelvic abscess.  The complex collection of fluid at the vaginal cuff has increased in size, with multiple septations and peripheral enhancement, measuring approximately 4.0 x 3.6 x 2.7 cm. A  small amount of surrounding free fluid is seen.  The remaining visualized small bowel is grossly unremarkable in appearance, with relative decompression of distal small bowel loops. The presence of trace fluid within the distal small bowel suggests that the bowel obstruction is incomplete, increasing the likelihood of obstruction due  to pelvic inflammation. The right lower quadrant ileostomy is unremarkable in appearance.  The stomach is within normal limits. No acute vascular abnormalities are seen. Minimal calcification is noted along the abdominal aorta and its branches.  The patient's Hartmann's pouch is decompressed and unremarkable in appearance.  The bladder is decompressed and grossly unremarkable in appearance, though difficult to fully assess. No pelvic sidewall lymphadenopathy is identified. No inguinal lymphadenopathy is seen.  No acute osseous abnormalities are identified.  IMPRESSION: 1. Dilatation of relatively long 50 cm segment of small bowel at the lower abdomen, to 4.1 cm in maximal diameter. There is twisting of the more proximal and distal loops at the level of the upper pelvis, with significant associated ileal wall thickening and soft tissue inflammation. The clump of inflamed small bowel is directly adjacent to the previously noted complex collection of fluid at the vaginal cuff. The complex collection of fluid has increased in size, measuring 4.0 x 3.6 x 2.7 cm, with multiple septations and peripheral enhancement. The appearance is suspicious for closed loop obstruction; alternatively, it could reflect multiple adhesions, or obstruction due to inflammation in association with an enlarging complex pelvic abscess. Trace residual fluid within the more distal small bowel favors not-quite-complete obstruction due to focal pelvic inflammation. 2. Mild nonspecific prominence of the intrahepatic biliary ducts, with mild gallbladder distention. No evidence of distal obstruction. 3. Small amount of free fluid noted within the pelvis.  These results were called by telephone at the time of interpretation on 05/06/2013 at 11:34 PM to [facility] PA, who verbally acknowledged these results.   Electronically Signed   By: Roanna Raider M.D.   On: 05/06/2013 23:37   Dg Chest Port 1 View  05/07/2013   CLINICAL DATA:  PICC line  placement  EXAM: PORTABLE CHEST - 1 VIEW  COMPARISON:  04/26/2013  FINDINGS: A right-sided PICC line is appreciated with tip projecting in the region of the superior vena cava. There is no evidence of a pneumothorax. The lungs are hyperinflated. Emphysematous changes identified within the lung apices. No focal region of consolidation or focal infiltrates. Cardiac silhouette is within normal limits. Chronic changes identified within the the right humeral head.  IMPRESSION: PICC line as described above.  COPD   Electronically Signed   By: Salome Holmes M.D.   On: 05/07/2013 14:00    Anti-infectives: Anti-infectives   Start     Dose/Rate Route Frequency Ordered Stop   05/07/13 1000  hydroxychloroquine (PLAQUENIL) tablet 200 mg     200 mg Oral 2 times daily 05/07/13 0409     05/07/13 0500  ertapenem (INVANZ) 1 g in sodium chloride 0.9 % 50 mL IVPB     1 g 100 mL/hr over 30 Minutes Intravenous Daily 05/07/13 0409        Assessment/Plan: 1. Nausea/ vomiting - probable functional instead of mechanical gastric obstruction  2. No clinical signs of SBO, as her ileostomy continues to function well. SB thickening on CT scan likely secondary to 4 cm complex fluid collection  3. PCM, FTT 4. Anemia, acute on chronic   Continue withTPN  Proceed with UGI with small bowel follow through tomorrow morning May have clears  Continue  with invanz  Limit pain medication  Consider IR consult to evaluate fluid collection, but hold off for now.  WBC back to normal.   Check TSH Obtain hemoccult, type and cross, repeat CBC in AM Hypotension is likely pain medication related, recheck, change to fentanyl.    LOS: 2 days    Fran Mcree ANP-BC 05/08/2013 11:23 AM

## 2013-05-09 ENCOUNTER — Inpatient Hospital Stay (HOSPITAL_COMMUNITY): Payer: Medicare Other

## 2013-05-09 ENCOUNTER — Telehealth: Payer: Self-pay | Admitting: Critical Care Medicine

## 2013-05-09 DIAGNOSIS — R112 Nausea with vomiting, unspecified: Secondary | ICD-10-CM | POA: Diagnosis present

## 2013-05-09 DIAGNOSIS — R109 Unspecified abdominal pain: Secondary | ICD-10-CM | POA: Diagnosis present

## 2013-05-09 DIAGNOSIS — E0590 Thyrotoxicosis, unspecified without thyrotoxic crisis or storm: Secondary | ICD-10-CM

## 2013-05-09 DIAGNOSIS — E46 Unspecified protein-calorie malnutrition: Secondary | ICD-10-CM | POA: Diagnosis present

## 2013-05-09 LAB — DIFFERENTIAL
Basophils Absolute: 0 10*3/uL (ref 0.0–0.1)
Lymphocytes Relative: 23 % (ref 12–46)
Neutro Abs: 5.9 10*3/uL (ref 1.7–7.7)
Neutrophils Relative %: 63 % (ref 43–77)

## 2013-05-09 LAB — GLUCOSE, CAPILLARY: Glucose-Capillary: 105 mg/dL — ABNORMAL HIGH (ref 70–99)

## 2013-05-09 LAB — CBC
MCH: 27.6 pg (ref 26.0–34.0)
Platelets: 250 10*3/uL (ref 150–400)
RDW: 14.1 % (ref 11.5–15.5)
WBC: 9.4 10*3/uL (ref 4.0–10.5)

## 2013-05-09 LAB — COMPREHENSIVE METABOLIC PANEL
AST: 9 U/L (ref 0–37)
BUN: 4 mg/dL — ABNORMAL LOW (ref 6–23)
CO2: 24 mEq/L (ref 19–32)
Calcium: 8.6 mg/dL (ref 8.4–10.5)
Creatinine, Ser: 0.66 mg/dL (ref 0.50–1.10)
GFR calc Af Amer: >90 mL/min (ref >90)
GFR calc non Af Amer: >90 mL/min (ref >90)
Glucose, Bld: 97 mg/dL (ref 70–99)

## 2013-05-09 LAB — PREALBUMIN: Prealbumin: 7.8 mg/dL — ABNORMAL LOW (ref 17.0–34.0)

## 2013-05-09 LAB — PHOSPHORUS: Phosphorus: 3 mg/dL (ref 2.3–4.6)

## 2013-05-09 LAB — TSH: TSH: 0.12 u[IU]/mL — ABNORMAL LOW (ref 0.350–4.500)

## 2013-05-09 MED ORDER — TRACE MINERALS CR-CU-F-FE-I-MN-MO-SE-ZN IV SOLN
INTRAVENOUS | Status: AC
  Administered 2013-05-09: 18:00:00 via INTRAVENOUS
  Filled 2013-05-09: qty 1000

## 2013-05-09 MED ORDER — FENTANYL CITRATE 0.05 MG/ML IJ SOLN
INTRAMUSCULAR | Status: AC
  Filled 2013-05-09: qty 2

## 2013-05-09 MED ORDER — PRO-STAT SUGAR FREE PO LIQD
30.0000 mL | Freq: Every day | ORAL | Status: DC
  Administered 2013-05-10 – 2013-05-11 (×2): 30 mL via ORAL
  Filled 2013-05-09 (×2): qty 30

## 2013-05-09 MED ORDER — SODIUM CHLORIDE 0.9 % IV SOLN
INTRAVENOUS | Status: DC
  Administered 2013-05-09 – 2013-05-10 (×2): via INTRAVENOUS

## 2013-05-09 MED ORDER — FAT EMULSION 20 % IV EMUL
250.0000 mL | INTRAVENOUS | Status: AC
  Administered 2013-05-09: 250 mL via INTRAVENOUS
  Filled 2013-05-09: qty 250

## 2013-05-09 MED ORDER — MAGNESIUM SULFATE IN D5W 10-5 MG/ML-% IV SOLN
1.0000 g | Freq: Once | INTRAVENOUS | Status: AC
  Administered 2013-05-09: 1 g via INTRAVENOUS
  Filled 2013-05-09: qty 100

## 2013-05-09 NOTE — Telephone Encounter (Signed)
Pt's sister is aware that PW is aware that the pt has been admitted.

## 2013-05-09 NOTE — Telephone Encounter (Signed)
Pt's sister returned triage's call.  [name]

## 2013-05-09 NOTE — Telephone Encounter (Signed)
[id number] x1--will forward to Dr. [name] so he is aware

## 2013-05-09 NOTE — Progress Notes (Signed)
Subjective: Pt back from radiology.  No n/v if she does not eat, but asking for solid food.  Objective: Vital signs in last 24 hours: Temp:  [98.6 F (37 C)-99.1 F (37.3 C)] 98.6 F (37 C) (12/15 0500) Pulse Rate:  [78-92] 78 (12/15 0500) Resp:  [16] 16 (12/15 0500) BP: (95-115)/(49-70) 95/70 mmHg (12/15 0500) SpO2:  [100 %] 100 % (12/15 0500) Weight:  [97 lb 7.1 oz (44.2 kg)] 97 lb 7.1 oz (44.2 kg) (12/15 0500) Last BM Date: 05/08/13  Intake/Output from previous day: 12/14 0701 - 12/15 0700 In: 2330 [I.V.:1410; IV Piggyback:200; TPN:720] Out: 725 [Stool:725] Intake/Output this shift: Total I/O In: -  Out: 300 [Urine:300]  PE  General appearance: alert, cooperative and no distress  Resp: clear to auscultation bilaterally  Cardio: regular rate and rhythm, S1, S2 normal, no murmur, click, rub or gallop  GI: diffuse mild ttp, no evidence of peritonitis. +bs, ileostomy with white barium in bag. Extremities: extremities normal, atraumatic, no cyanosis or edema  Lab Results:   Recent Labs  05/08/13 0445 05/09/13 0458  WBC 9.4 9.4  HGB 7.8* 7.7*  HCT 24.5* 23.7*  PLT 257 250   BMET  Recent Labs  05/08/13 0445 05/09/13 0458  NA 137 140  K 3.5 3.9  CL 105 107  CO2 24 24  GLUCOSE 107* 97  BUN 6 4*  CREATININE 0.73 0.66  CALCIUM 8.5 8.6   PT/INR No results found for this basename: LABPROT, INR,  in the last 72 hours ABG No results found for this basename: PHART, PCO2, PO2, HCO3,  in the last 72 hours  Studies/Results: Dg Chest Port 1 View  05/07/2013   CLINICAL DATA:  PICC line placement  EXAM: PORTABLE CHEST - 1 VIEW  COMPARISON:  04/26/2013  FINDINGS: A right-sided PICC line is appreciated with tip projecting in the region of the superior vena cava. There is no evidence of a pneumothorax. The lungs are hyperinflated. Emphysematous changes identified within the lung apices. No focal region of consolidation or focal infiltrates. Cardiac silhouette is within  normal limits. Chronic changes identified within the the right humeral head.  IMPRESSION: PICC line as described above.  COPD   Electronically Signed   By: Salome Holmes M.D.   On: 05/07/2013 14:00    Anti-infectives: Anti-infectives   Start     Dose/Rate Route Frequency Ordered Stop   05/07/13 1000  hydroxychloroquine (PLAQUENIL) tablet 200 mg     200 mg Oral 2 times daily 05/07/13 0409     05/07/13 0500  ertapenem (INVANZ) 1 g in sodium chloride 0.9 % 50 mL IVPB     1 g 100 mL/hr over 30 Minutes Intravenous Daily 05/07/13 0409        Assessment/Plan: Hx Crohn's disease s/p Hartman's with ileostomy in 2007 Exploratory laparotomy with LOA, Incisional and parastomal hernia repair and insertion of biologic mesh 03/16/13 Dr. Johna Sheriff  Nausea, vomiting and abdominal pain with oral intake since October -she has a partial SBO with a transition point in the mid pelvis on upper GI.  She will require surgery for this, however, she is 7 weeks out from her last surgery and therefore at a high risk of complications. Will allow clears today.  Advance to full liquid tomorrow.  If she is able to tolerate full liquid without vomiting, the we will discharge her home and plan for surgery at a later time. If she is unable to tolerate full liquid diet, then we will have to  explore her this admission. Pelvic fluid collection 4x3.6x2.7cm -IR unable to drain in previous hospitalization consultation -continue with Invanz D#3 -monitor CBC PCM/FTT -continue with TPN Chronic Anemia -obtain Iron TIBC in AM -hemoccult x3 COPD -home meds, PRN 02 Hypertension -stable Type II diabetes Mellitus -CBGs, insulin Hyperthyroidism -TSH .120.  Add a Free T4 -may need to consult IM    LOS: 3 days   Destynie Toomey ANP-BC 05/09/2013 12:51 PM

## 2013-05-09 NOTE — Telephone Encounter (Signed)
Let sister know I am aware of admission.  I am office based in [place] tomorrow and off the rest of week  I her hospital doctors need pulmonary help they can consult the hospital based doctor

## 2013-05-09 NOTE — Progress Notes (Signed)
UGI shows PSBO likely from adhesion. Ileostomy with good output. Clears today. If she can tolerate fulls, can likely go home later this week on that. We need to be sure she can do that as she has bounced back. It is a perilous time to go back into her abdomen but our hand will be forced if she cannot tolerate fulls. Will go gradually. Patient examined and I agree with the assessment and plan  Violeta Gelinas, MD, MPH, FACS Pager: [phone]  05/09/2013 1:15 PM

## 2013-05-09 NOTE — Progress Notes (Signed)
INITIAL NUTRITION ASSESSMENT  DOCUMENTATION CODES Per approved criteria  -Severe malnutrition in the context of chronic illness -Underweight   INTERVENTION: 30 ml Prostat daily  Reviewed protein sources.   NUTRITION DIAGNOSIS: Malnutrition related to chronic illness as evidenced by 20% weight loss x 2 months and intake of </= 75% of her needs for >/= 1 month.   Goal: Pt to meet >/= 90% of their estimated nutrition needs   Monitor:  Diet advancement, PO intake, weight trend, labs   Reason for Assessment: TPN Consult  62 y.o. female  Admitting Dx: <principal problem not specified>  ASSESSMENT: Hx Crohn's disease s/p Hartman's with ileostomy in 2007  Exploratory laparotomy with LOA, Incisional and parastomal hernia repair and insertion of biologic mesh 03/16/13 Dr. Johna Sheriff  Nausea, vomiting and abdominal pain with oral intake since October. Pt to have work-up including a UGI.  Per pt she was 130 lb at the beginning of the year and wanted to lose weight. She began exercising and eliminating sodas and lost down to 121 lb. At the end of October she began to have N/V and abd pain and has been unable to eat much and the weight just fell off of her.  Per UGI pt has a PSBO and MD plans to advance her diet slowly.  Pt is lactose intolerant and dislikes all of our liquid supplements. Pt is willing to try Prostat.  Pt has been educated on high calorie and high protein foods in the past. We did review what protein sources are on her tray.  Pt was identified as being severely malnourished at last admission.  Pt started on TPN. TPN increasing to 40 ml/hr of Clinimix E 5/15 with 20% lipids at 10 ml/hr.  This provides: 1159 kcal (83% of her needs) and 47.5 grams of protein (68% of her needs). Pt is tolerating clear liquids but has very strong preferences which limits out ability to supplement diet.  Pt is willing to eat yogurt once diet advanced.   Nutrition Focused Physical  Exam:  Subcutaneous Fat:  Orbital Region: WNL Upper Arm Region: mild wasting Thoracic and Lumbar Region: severe wasting  Muscle:  Temple Region: severe wasting Clavicle Bone Region: severe wasting Clavicle and Acromion Bone Region: mild wasting Scapular Bone Region: severe wasting Dorsal Hand: moderate wasting Patellar Region: moderate wasting Anterior Thigh Region: moderate wasting Posterior Calf Region: moderate wasting  Edema: not present   Height: Ht Readings from Last 1 Encounters:  05/07/13 5\' 1"  (1.549 m)    Weight: Wt Readings from Last 1 Encounters:  05/09/13 97 lb 7.1 oz (44.2 kg)    Ideal Body Weight: 47.7 kg   % Ideal Body Weight: 93%  Wt Readings from Last 10 Encounters:  05/09/13 97 lb 7.1 oz (44.2 kg)  04/26/13 110 lb 4.8 oz (50.032 kg)  04/19/13 109 lb 2 oz (49.5 kg)  04/11/13 109 lb (49.442 kg)  03/30/13 126 lb 12.2 oz (57.5 kg)  03/16/13 144 lb 10 oz (65.6 kg)  03/16/13 144 lb 10 oz (65.6 kg)  03/16/13 144 lb 10 oz (65.6 kg)  03/09/13 119 lb (53.978 kg)  02/09/13 117 lb 9.6 oz (53.343 kg)    Usual Body Weight: 126 lb   % Usual Body Weight: 77%  BMI:  Body mass index is 18.42 kg/(m^2).  Estimated Nutritional Needs: Kcal: 1400-1600  Protein: 70-80 gm  Fluid: >/= 1.5 L  Skin: no issues  Diet Order: Clear Liquid  EDUCATION NEEDS: -Education needs addressed   Intake/Output  Summary (Last 24 hours) at 05/09/13 1258 Last data filed at 05/09/13 3086  Gross per 24 hour  Intake   2330 ml  Output    850 ml  Net   1480 ml    Last BM: ileostomy 725 ml 12/14   Labs:   Recent Labs Lab 05/06/13 1924 05/06/13 2022 05/08/13 0445 05/09/13 0458  NA 133*  --  137 140  K 4.1  --  3.5 3.9  CL 96  --  105 107  CO2 22  --  24 24  BUN 22  --  6 4*  CREATININE 0.98  --  0.73 0.66  CALCIUM 9.7  --  8.5 8.6  MG  --  1.6 1.4* 1.8  PHOS  --   --  3.0 3.0  GLUCOSE 88  --  107* 97    CBG (last 3)   Recent Labs  05/09/13 0412  05/09/13 0741 05/09/13 1133  GLUCAP 105* 94 108*    Scheduled Meds: . arformoterol  15 mcg Nebulization BID  . budesonide  0.25 mg Nebulization BID  . citalopram  20 mg Oral Daily  . diltiazem  180 mg Oral BID  . enoxaparin (LOVENOX) injection  30 mg Subcutaneous Daily  . ertapenem (INVANZ) IV  1 g Intravenous Daily  . fentaNYL      . fluticasone  2 spray Each Nare Daily  . hydroxychloroquine  200 mg Oral BID  . insulin aspart  0-9 Units Subcutaneous Q4H  . metoprolol tartrate  12.5 mg Oral BID  . pantoprazole (PROTONIX) IV  40 mg Intravenous QHS  . sodium chloride  10-40 mL Intracatheter Q12H  . tiotropium  18 mcg Inhalation Daily    Continuous Infusions: . sodium chloride 70 mL/hr at 05/08/13 0711  . Marland KitchenTPN (CLINIMIX-E) Adult 30 mL/hr at 05/08/13 1713   And  . fat emulsion 240 mL (05/08/13 1713)    Past Medical History  Diagnosis Date  . Other diseases of vocal cords   . Osteoporosis, unspecified   . Other and unspecified hyperlipidemia   . Esophageal reflux   . Chronic airway obstruction, not elsewhere classified   . Regional enteritis of unspecified site   . 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 Crohn's disease     Ileostomy  . Pelvic adhesions   . Anxiety   . Depression   . Hypertension   . Emphysema   . Hematuria   . Severe cervical dysplasia age 82    cone biopsy with neg paps since  . Pneumonia     "3-4 times; necrotizing once" (04/19/2013)  . Asthmatic bronchitis , chronic   . Exertional shortness of breath   . On home oxygen therapy     "24/7; 2.5L @ rest; 3L when I'm doing my chores" (04/19/2013)  . Type II diabetes mellitus   . Anemia   . History of blood transfusion     "I've had 3 or 4" (04/19/2013)  . Rheumatoid arthritis(714.0) 03/2012    [place]; "mild in my hands" (04/19/2013)    Past Surgical History  Procedure Laterality Date  . Ileostomy  2007  . Ear cyst excision Bilateral 1970's    "lobes"   . Cervical cone  biopsy  1978  . Colon surgery  2007    subtotal colectomy with ileostomy  . Esophagogastroduodenoscopy (egd) with esophageal dilation      "3 times, I think" (04/19/2013)  . Oophorectomy      LSO  . Tracheostomy  2010  . Hammer toe surgery Bilateral 1980's  . Balloon dilation  02/10/2012    Procedure: BALLOON DILATION;  Surgeon: Charolett Bumpers, MD;  Location: [facility] ENDOSCOPY;  Service: Endoscopy;  Laterality: N/A;  . Colposcopy    . Flexible sigmoidoscopy  03/26/2012    Procedure: FLEXIBLE SIGMOIDOSCOPY;  Surgeon: Charolett Bumpers, MD;  Location: [facility] ENDOSCOPY;  Service: Endoscopy;  Laterality: N/A;  . Cardiac catheterization  07/08/2007    normal  . Nm myocar perf wall motion  09/21/2006    no ischemia  . Ventral hernia repair N/A 03/16/2013    Procedure: REPAIR OF INCISIONAL AND PARASTOMAL HERNIAS;  Surgeon: Mariella Saa, MD;  Location: [facility] OR;  Service: General;  Laterality: N/A;  . Laparotomy N/A 03/16/2013    Procedure: EXPLORATORY LAPAROTOMY;  Surgeon: Mariella Saa, MD;  Location: [facility] OR;  Service: General;  Laterality: N/A;  . Lysis of adhesion N/A 03/16/2013    Procedure: LYSIS OF ADHESIONS FOR SMALL BOWEL OBSTRUCTION;  Surgeon: Mariella Saa, MD;  Location: [facility] OR;  Service: General;  Laterality: N/A;  . Insertion of mesh N/A 03/16/2013    Procedure: INSERTION OF BIOLOGIC MESH;  Surgeon: Mariella Saa, MD;  Location: [facility] OR;  Service: General;  Laterality: N/A;  . Tee without cardioversion N/A 03/21/2013    Procedure: TRANSESOPHAGEAL ECHOCARDIOGRAM (TEE);  Surgeon: Vesta Mixer, MD;  Location: [facility] ENDOSCOPY;  Service: Cardiovascular;  Laterality: N/A;  . Vaginal hysterectomy  1993    [facility]-LSO  . Tracheostomy closure  [address], LDN, CNSC [phone] Pager [phone] After Hours Pager

## 2013-05-09 NOTE — Progress Notes (Signed)
PARENTERAL NUTRITION CONSULT NOTE  Pharmacy Consult for TPN Indication: intolerance to enteral feeds, severe weight loss in past few months  Allergies  Allergen Reactions  . Esomeprazole Magnesium Other (See Comments)    NEXIUM - reaction > aggravated pt's Crohn's disease  . Shellfish Allergy Shortness Of Breath and Nausea And Vomiting  . Surgical Lubricant Other (See Comments)    Burns skin   . Other Nausea And Vomiting    Beans, Dander/Dust, Peas, Mushrooms  . Peanut-Containing Drug Products Nausea And Vomiting  . Levaquin [Levofloxacin]     Patient Measurements: Height: 5\' 1"  (154.9 cm) Weight: 97 lb 7.1 oz (44.2 kg) IBW/kg (Calculated) : 47.8 Usual Weight: patient with significant weight loss in past 2 months. She was 110lbs on 12/2 and at that time, RD Maureen Chatters had assessed her as meeting criteria for severe malnutrition. Has lost more weight since that assessment. 12/13 weight on admit was 37kg, 12/14 weight is 44.5kg- doubt she gained that overnight. Likely difference in bed scales.  Vital Signs: Temp: 98.6 F (37 C) (12/15 0500) Temp src: Oral (12/15 0500) BP: 95/70 mmHg (12/15 0500) Pulse Rate: 78 (12/15 0500) Intake/Output from previous day: 12/14 0701 - 12/15 0700 In: 2330 [I.V.:1410; IV Piggyback:200; TPN:720] Out: 725 [Stool:725] Intake/Output from this shift: Total I/O In: -  Out: 300 [Urine:300]  Labs:  Recent Labs  05/06/13 1924 05/08/13 0445 05/09/13 0458  WBC 13.2* 9.4 9.4  HGB 10.0* 7.8* 7.7*  HCT 30.8* 24.5* 23.7*  PLT 340 257 250     Recent Labs  05/06/13 1924 05/06/13 2022 05/08/13 0445 05/09/13 0458  NA 133*  --  137 140  K 4.1  --  3.5 3.9  CL 96  --  105 107  CO2 22  --  24 24  GLUCOSE 88  --  107* 97  BUN 22  --  6 4*  CREATININE 0.98  --  0.73 0.66  CALCIUM 9.7  --  8.5 8.6  MG  --  1.6 1.4* 1.8  PHOS  --   --  3.0 3.0  PROT 7.3  --  5.6* 5.6*  ALBUMIN 2.7*  --  2.1* 2.0*  AST 11  --  8 9  ALT 6  --  5 <5   ALKPHOS 85  --  68 61  BILITOT 0.3  --  0.2* 0.1*  PREALBUMIN  --   --  6.6* 7.8*  TRIG  --   --  94 93   Estimated Creatinine Clearance: 50.2 ml/min (by C-G formula based on Cr of 0.66).    Recent Labs  05/09/13 0412 05/09/13 0741 05/09/13 1133  GLUCAP 105* 94 108*    Insulin Requirements in the past 12 hours:  None  Current Nutrition:  Clear liquid diet -- tolerated clear broth today Clinimix E 5/15 at 46mL/hr and lipid emulsion 20% at 52mL/hr provides 1008 kcal and 36 grams protein per day.  Nutritional Goals:  1400-1600 kCal, 70-80 grams of protein per day per RD recommendations 12/3.   Assessment: 73 YOF with Crohn's disease S/p ex lap 10/22 with previous ileostomy. Hospitalized 11/2-11/12 due to pelvic abscess which required drain placement. Admitted again 11/25-11/26 and 12/1-12/5 both times with n/v/abdominal pain. Now presenting again with similar complaints.  GI: Complex GI/surgical history (see above). UGI on 12/15 showed PSBO likely from adhesion. Ileostomy with good output. Tolerating clears -- to advance to full liquids on 12/16. If tolerated -- can be d/ced home and have surgery at  a later time. If not tolerated -- will require surgery this admission. Tolerating TPN. On IV PPI. Endo: Hx DM. A1c 5.5. CBGs/24h: 94-110. On sensitive SSI Lytes: Na 140, K 3.9, Mg 1.8, Phos 3, CoCa~10.1 -- noted high risk for refeeding, electrolytes stable for TPN advancement today.  Renal: SCr 0.66, CrCl~50 ml/min. UOP/24h: 0.7 ml/kg/hr.  Pulm: Hx chronic bronchitis. 100% on 2.5 L n.c. On brovana, pulmicort, spiriva Cards: Hx HTN. BP soft-wnl, HR wnl. On diltiazem, lopressor Hepatobil: LFTs wnl, Tbili/Alk Phos wnl, TG 93, Alb 2, pre-albumin 7.8 (12/15) Neuro: Hx anxiety/depression. On celexa ID: Ertapenem D#3 for empiric abd coverage in the setting of pelvic fluid collection. Tmax/24h: 99.1, WBC wnl Best Practices: Enox, PPI IV TPN Access: Double lumen PICC placed 12/13 TPN day#: 2  (12/13 >> current)  Plan - Increase Clinimix E 5/15 to 40 ml/hr to optimize full TPN bag however will keep at reduced rate to help with appetite stimulation with diet advancement - Add MV and TE to TPN bag - Continue 20% lipids at 10 ml/hr  - Reduce IVF to 35 ml/hr when TPN rate increased this evening.  - Magnesium 1g IV x 1 - Will f/u TPN labs, tolerance of diet and progression  Georgina Pillion, PharmD, BCPS Clinical Pharmacist Pager: [phone] 05/09/2013 1:32 PM

## 2013-05-09 NOTE — Telephone Encounter (Signed)
I spoke with sister and is aware we will let Dr. [name] know. Nothing further needed

## 2013-05-10 ENCOUNTER — Other Ambulatory Visit: Payer: Medicare Other

## 2013-05-10 DIAGNOSIS — E119 Type 2 diabetes mellitus without complications: Secondary | ICD-10-CM

## 2013-05-10 DIAGNOSIS — D649 Anemia, unspecified: Secondary | ICD-10-CM

## 2013-05-10 LAB — GLUCOSE, CAPILLARY
Glucose-Capillary: 105 mg/dL — ABNORMAL HIGH (ref 70–99)
Glucose-Capillary: 107 mg/dL — ABNORMAL HIGH (ref 70–99)
Glucose-Capillary: 90 mg/dL (ref 70–99)
Glucose-Capillary: 96 mg/dL (ref 70–99)

## 2013-05-10 LAB — IRON AND TIBC
Saturation Ratios: 28 % (ref 20–55)
TIBC: 151 ug/dL — ABNORMAL LOW (ref 250–470)
UIBC: 109 ug/dL — ABNORMAL LOW (ref 125–400)

## 2013-05-10 LAB — VITAMIN B12: Vitamin B-12: 588 pg/mL (ref 211–911)

## 2013-05-10 MED ORDER — FAT EMULSION 20 % IV EMUL
250.0000 mL | INTRAVENOUS | Status: DC
  Administered 2013-05-10: 250 mL via INTRAVENOUS
  Filled 2013-05-10: qty 250

## 2013-05-10 MED ORDER — TRACE MINERALS CR-CU-F-FE-I-MN-MO-SE-ZN IV SOLN
INTRAVENOUS | Status: DC
  Administered 2013-05-10: 17:00:00 via INTRAVENOUS
  Filled 2013-05-10: qty 1000

## 2013-05-10 NOTE — Progress Notes (Signed)
Subjective: Pt in good spirits.  Tolerating clear liquids well for 3 meals.  Will advance to fulls to see how she does.  Dietitian saw patient yesterday to discuss what to eat and how to eat it.  Pt having much less pain with meals.  Wants to advance.  Ambulating well.    Objective: Vital signs in last 24 hours: Temp:  [98.1 F (36.7 C)-99.1 F (37.3 C)] 98.1 F (36.7 C) (12/16 0611) Pulse Rate:  [84-102] 84 (12/16 0630) Resp:  [16-18] 16 (12/16 0611) BP: (82-120)/(34-56) 96/54 mmHg (12/16 0630) SpO2:  [97 %-100 %] 100 % (12/16 0611) Weight:  [97 lb (44 kg)] 97 lb (44 kg) (12/16 0611) Last BM Date: 05/09/13  Intake/Output from previous day: 12/15 0701 - 12/16 0700 In: 2375.1 [P.O.:240; I.V.:1000.4; IV Piggyback:50; TPN:1084.7] Out: 2525 [Urine:2200; Stool:325] Intake/Output this shift:    PE: Gen:  Alert, NAD, pleasant Abd: Soft, mild tenderness (but always tender), ND, +BS, no HSM, abdominal scars noted, ostomy with pinkish red output (cherry jello not blood), flatus and BM in bag.   Lab Results:   Recent Labs  05/08/13 0445 05/09/13 0458  WBC 9.4 9.4  HGB 7.8* 7.7*  HCT 24.5* 23.7*  PLT 257 250   BMET  Recent Labs  05/08/13 0445 05/09/13 0458  NA 137 140  K 3.5 3.9  CL 105 107  CO2 24 24  GLUCOSE 107* 97  BUN 6 4*  CREATININE 0.73 0.66  CALCIUM 8.5 8.6   PT/INR No results found for this basename: LABPROT, INR,  in the last 72 hours CMP     Component Value Date/Time   NA 140 05/09/2013 0458   K 3.9 05/09/2013 0458   CL 107 05/09/2013 0458   CO2 24 05/09/2013 0458   GLUCOSE 97 05/09/2013 0458   BUN 4* 05/09/2013 0458   CREATININE 0.66 05/09/2013 0458   CREATININE 1.04 08/27/2012 1054   CALCIUM 8.6 05/09/2013 0458   CALCIUM 9.0 07/15/2011 0920   PROT 5.6* 05/09/2013 0458   ALBUMIN 2.0* 05/09/2013 0458   AST 9 05/09/2013 0458   ALT <5 05/09/2013 0458   ALKPHOS 61 05/09/2013 0458   BILITOT 0.1* 05/09/2013 0458   GFRNONAA >90 05/09/2013 0458   GFRAA >90 05/09/2013 0458   Lipase     Component Value Date/Time   LIPASE 19 05/06/2013 1924       Studies/Results: Dg Ugi W/small Bowel  05/09/2013   CLINICAL DATA:  Abdominal pain.  Crohn's disease.  Ileostomy.  EXAM: UPPER GI SERIES WITH SMALL BOWEL FOLLOW-THROUGH  TECHNIQUE: Combined double contrast and single contrast upper GI series using effervescent crystals, thick barium, and thin barium. Subsequently, serial images of the small bowel were obtained including spot views of the small bowel.  COMPARISON:  CT scan dated 05/06/2013 and upper GI with small-bowel follow-through dated 10/13/2009  FLUOROSCOPY TIME:  1 min 44 seconds  FINDINGS: Vascular coils are seen in the mid abdomen. There dilated loops of small bowel seen in the left side of the abdomen on the scout image. The patient slowly sipped on barium. The esophagus appears normal. The stomach and pylorus and duodenal bulb appear normal. C-loop appears normal.  Small bowel transit time was 2 hr.  There are multiple dilated loops of small bowel in the left mid abdomen and in the central portion of the pelvis with tethering of these bowel loops. The distal ileum appears normal. Ileostomy appears normal. No definable fistulae. The transition point is visible on the  45 min image of the abdomen.  I do not see an area low in the pelvis that correlates with the inhomogeneous area adjacent to the top of the vagina and the stump of distal colon visible on the recent CT scan. I suspect this could represent an abscess.  IMPRESSION: 1. Partial small bowel obstruction. The transition point is in the mid pelvis. 2. I suspect that the inhomogeneous area in the central portion of the pelvis on the prior CT scan is a multi-septated abscess rather than clumped small bowel.   Electronically Signed   By: Geanie Cooley M.D.   On: 05/09/2013 12:50    Anti-infectives: Anti-infectives   Start     Dose/Rate Route Frequency Ordered Stop   05/07/13 1000   hydroxychloroquine (PLAQUENIL) tablet 200 mg     200 mg Oral 2 times daily 05/07/13 0409     05/07/13 0500  ertapenem (INVANZ) 1 g in sodium chloride 0.9 % 50 mL IVPB     1 g 100 mL/hr over 30 Minutes Intravenous Daily 05/07/13 0409         Assessment/Plan Hx Crohn's disease s/p Hartman's with ileostomy in 2007  Exploratory laparotomy with LOA, Incisional and parastomal hernia repair and insertion of biologic mesh 03/16/13 Dr. Johna Sheriff  Nausea, vomiting and abdominal pain with oral intake since October  -she has a partial SBO with a transition point in the mid pelvis on upper GI. She will require surgery for this, however, she is 7 weeks out from her last surgery and therefore at a high risk of complications including fistulas etc. -Will allow fulls today.  If she is able to tolerate full liquid without vomiting, the we will discharge her home and plan for surgery at a later time.  -If she is unable to tolerate full liquid diet, then we will have to explore her this admission.  Pelvic fluid collection 4x3.6x2.7cm  -IR unable to drain in previous hospitalization consultation  -continue with Invanz D#4 -monitor CBC (wbc 9.4) PCM/FTT  -continue with TPN  Chronic Anemia  -Pending Iron TIBC, B12 and folate -hemoccult x3  COPD  -home meds, PRN 02  Hypertension  -stable  Type II diabetes Mellitus  -CBGs, insulin  Hyperthyroidism  -TSH .120. Free T4 normal at 1.03, likely subclinical, will discuss after anemia labs back about consult to IM      LOS: 4 days    [name], [name] 05/10/2013, 9:05 AM Pager: [phone]

## 2013-05-10 NOTE — Progress Notes (Signed)
Going slow. Try fulls. Abdomen soft, +BS. Patient examined and I agree with the assessment and plan  [name] Gelinas, MD, MPH, FACS Pager: [phone]  05/10/2013 10:50 AM

## 2013-05-10 NOTE — Progress Notes (Signed)
PARENTERAL NUTRITION CONSULT NOTE  Pharmacy Consult for TPN Indication: intolerance to enteral feeds, severe weight loss in past few months  Allergies  Allergen Reactions  . Esomeprazole Magnesium Other (See Comments)    NEXIUM - reaction > aggravated pt's Crohn's disease  . Shellfish Allergy Shortness Of Breath and Nausea And Vomiting  . Surgical Lubricant Other (See Comments)    Burns skin   . Other Nausea And Vomiting    Beans, Dander/Dust, Peas, Mushrooms  . Peanut-Containing Drug Products Nausea And Vomiting  . Levaquin [Levofloxacin]     Patient Measurements: Height: 5\' 1"  (154.9 cm) Weight: 97 lb (44 kg) IBW/kg (Calculated) : 47.8 Usual Weight: patient with significant weight loss in past 2 months. She was 110lbs on 12/2 and at that time, RD Maureen Chatters had assessed her as meeting criteria for severe malnutrition. Has lost more weight since that assessment. 12/13 weight on admit was 37kg, 12/14 weight is 44.5kg- doubt she gained that overnight. Likely difference in bed scales.  Vital Signs: Temp: 98.1 F (36.7 C) (12/16 0611) Temp src: Oral (12/16 0611) BP: 96/54 mmHg (12/16 0630) Pulse Rate: 84 (12/16 0630) Intake/Output from previous day: 12/15 0701 - 12/16 0700 In: 2375.1 [P.O.:240; I.V.:1000.4; IV Piggyback:50; TPN:1084.7] Out: 2525 [Urine:2200; Stool:325] Intake/Output from this shift:    Labs:  Recent Labs  05/08/13 0445 05/09/13 0458  WBC 9.4 9.4  HGB 7.8* 7.7*  HCT 24.5* 23.7*  PLT 257 250     Recent Labs  05/08/13 0445 05/09/13 0458  NA 137 140  K 3.5 3.9  CL 105 107  CO2 24 24  GLUCOSE 107* 97  BUN 6 4*  CREATININE 0.73 0.66  CALCIUM 8.5 8.6  MG 1.4* 1.8  PHOS 3.0 3.0  PROT 5.6* 5.6*  ALBUMIN 2.1* 2.0*  AST 8 9  ALT 5 <5  ALKPHOS 68 61  BILITOT 0.2* 0.1*  PREALBUMIN 6.6* 7.8*  TRIG 94 93   Estimated Creatinine Clearance: 50 ml/min (by C-G formula based on Cr of 0.66).    Recent Labs  05/09/13 1933 05/10/13 0011  05/10/13 0349  GLUCAP 75 107* 96    Insulin Requirements in the past 12 hours:  None  Current Nutrition:  Clear liquid diet -- eating 40-75% of meals Clinimix E 5/15 at 23mL/hr and lipid emulsion 20% at 31mL/hr provides 1162 kcal and 48 grams protein per day.  Nutritional Goals:  1400-1600 kCal, 70-80 grams of protein, >/= 1.5L fluid daily per RD recommendations 12/15   Assessment: 61 YOF with Crohn's disease S/p ex lap 10/22 with previous ileostomy. Hospitalized 11/2-11/12 due to pelvic abscess which required drain placement. Admitted again 11/25-11/26 and 12/1-12/5 both times with n/v/abdominal pain. Now presenting again with similar complaints.  GI: Complex GI/surgical history (see above). UGI on 12/15 showed PSBO likely from adhesion. Ileostomy with good output. Tolerating clears -- eating 40-75%. Diet advanced to full liquids today (12/16). If tolerated -- can be d/ced home and have surgery at a later time. If not tolerated -- will require surgery this admission. Tolerating TPN - will continue at reduced rate today (discussed with RD) to help stimulate appetite. Hopefully, can wean and d/c TPN on 12/17 if tolerating diet. On IV PPI. Last BM charted on 12/15 Endo: Hx DM. A1c 5.5. CBGs/24h: 75-107. On sensitive SSI Lytes: Na 140, K 3.9, Mg 1.8, Phos 3, CoCa~10.1 -- noted high risk for refeeding d/t FTT however electrolytes remain stable Renal: SCr 0.66, CrCl~50 ml/min. UOP/24h: 1.8 ml/kg/hr.  Pulm: Hx  chronic bronchitis. 100% on 2.5 L n.c. On brovana, pulmicort, spiriva Cards: Hx HTN. BP soft-wnl, HR wnl. On diltiazem, lopressor Hepatobil: LFTs wnl, Tbili/Alk Phos wnl, TG 93, Alb 2, pre-albumin 7.8 (12/15) Neuro: Hx anxiety/depression. On celexa ID: Ertapenem D#4 for empiric abd coverage in the setting of pelvic fluid collection. Tmax/24h: 99.1, WBC wnl Best Practices: Enox, PPI IV TPN Access: Double lumen PICC placed 12/13 TPN day#: 2 (12/13 >> current)  Plan - Continue Clinimix E  5/15 at 40 ml/hr to optimize full TPN bag however will keep at reduced rate to help with appetite stimulation with diet advancement - Add MV and TE to TPN bag - Reduce 20% lipids slightly to 5 ml/hr - Continue IVF at half rate (35 ml/hr) while on TPN - Will f/u TPN labs, tolerance of diet and progression with plans to wean and d/c TPN soon.  Georgina Pillion, PharmD, BCPS Clinical Pharmacist Pager: [phone] 05/10/2013 7:20 AM

## 2013-05-11 ENCOUNTER — Telehealth (INDEPENDENT_AMBULATORY_CARE_PROVIDER_SITE_OTHER): Payer: Self-pay

## 2013-05-11 LAB — FOLATE RBC: RBC Folate: 1013 ng/mL — ABNORMAL HIGH (ref >280)

## 2013-05-11 LAB — GLUCOSE, CAPILLARY
Glucose-Capillary: 92 mg/dL (ref 70–99)
Glucose-Capillary: 134 mg/dL — ABNORMAL HIGH (ref 70–99)

## 2013-05-11 MED ORDER — AMOXICILLIN-POT CLAVULANATE 875-125 MG PO TABS: 1.0000 | ORAL_TABLET | Freq: Two times a day (BID) | ORAL | Status: DC

## 2013-05-11 MED ORDER — HYDROCODONE-ACETAMINOPHEN 5-325 MG PO TABS: 1.0000 | ORAL_TABLET | Freq: Four times a day (QID) | ORAL | Status: DC | PRN

## 2013-05-11 MED ORDER — HEPARIN SOD (PORK) LOCK FLUSH 100 UNIT/ML IV SOLN
250.0000 [IU] | INTRAVENOUS | Status: DC | PRN
  Administered 2013-05-11 (×2): 250 [IU]
  Filled 2013-05-11: qty 3

## 2013-05-11 MED ORDER — HEPARIN SOD (PORK) LOCK FLUSH 100 UNIT/ML IV SOLN
250.0000 [IU] | Freq: Every day | INTRAVENOUS | Status: DC
  Filled 2013-05-11: qty 3

## 2013-05-11 MED ORDER — PRO-STAT SUGAR FREE PO LIQD: 30.0000 mL | Freq: Every day | ORAL | Status: DC

## 2013-05-11 MED ORDER — SODIUM CHLORIDE 0.9 % IV SOLN: 1.0000 g | INTRAVENOUS | Status: DC

## 2013-05-11 NOTE — Progress Notes (Signed)
Agree [name] Sanjurjo, MD, MPH, FACS Pager: [phone]  

## 2013-05-11 NOTE — Discharge Summary (Signed)
[name] Pletz, MD, MPH, FACS Pager: [phone]  

## 2013-05-11 NOTE — Discharge Summary (Signed)
Britten Parady, MD, MPH, FACS Pager: [phone]  

## 2013-05-11 NOTE — Discharge Summary (Addendum)
Physician Discharge Summary  Patient ID: [name] MRN: [id number] DOB/AGE: 63-Jul-1952 45 y.o.  Admit date: 05/07/2013 Discharge date: 05/11/2013  Admitting Diagnosis: Partial SBO Severe PCM Failure to thrive Nausea and vomiting Abdominal pain Pelvic abscess   Discharge Diagnosis Patient Active Problem List   Diagnosis Date Noted  . Protein calorie malnutrition 05/09/2013  . Nausea with vomiting 05/09/2013  . Abdominal pain, unspecified site 05/09/2013  . Failure to thrive in adult 05/07/2013  . Abdominal pain 04/26/2013  . Vomiting 04/26/2013  . SBO (small bowel obstruction) 04/26/2013  . Severe protein-calorie malnutrition 03/30/2013  . Anemia 03/28/2013  . Leukocytosis 03/28/2013  . Pelvic abscess in female 03/28/2013  . Sepsis 03/28/2013  . Chronic respiratory failure 01/19/2013  . Rheumatoid arthritis 05/11/2012  . Cervical dysplasia   . Hematuria 12/05/2011  . Normal coronary arteries 2009, nl-hyperdynamic LVF 2D 2010 12/02/2011  . Low TSH level, 0.08 with T3/T4 WNL 12/02/2011  . Anxiety disorder 11/01/2011  . DIABETES, TYPE 2 12/19/2008  . MULTIFOCAL ATRIAL TACHYCARDIA 08/13/2007  . DEPRESSIVE DISORDER NOT ELSEWHERE CLASSIFIED 06/02/2007  . HYPERLIPIDEMIA 01/27/2007  . Other diseases of vocal cords, history of prior trach 01/27/2007  . 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 GERD 01/27/2007  . CROHN'S DISEASE 01/27/2007  . OSTEOPOROSIS 01/27/2007    Consultants None  Imaging: Dg Ugi W/small Bowel  05/09/2013   CLINICAL DATA:  Abdominal pain.  Crohn's disease.  Ileostomy.  EXAM: UPPER GI SERIES WITH SMALL BOWEL FOLLOW-THROUGH  TECHNIQUE: Combined double contrast and single contrast upper GI series using effervescent crystals, thick barium, and thin barium. Subsequently, serial images of the small bowel were obtained including spot views of the small bowel.  COMPARISON:  CT scan dated 05/06/2013 and  upper GI with small-bowel follow-through dated 10/13/2009  FLUOROSCOPY TIME:  1 min 44 seconds  FINDINGS: Vascular coils are seen in the mid abdomen. There dilated loops of small bowel seen in the left side of the abdomen on the scout image. The patient slowly sipped on barium. The esophagus appears normal. The stomach and pylorus and duodenal bulb appear normal. C-loop appears normal.  Small bowel transit time was 2 hr.  There are multiple dilated loops of small bowel in the left mid abdomen and in the central portion of the pelvis with tethering of these bowel loops. The distal ileum appears normal. Ileostomy appears normal. No definable fistulae. The transition point is visible on the 45 min image of the abdomen.  I do not see an area low in the pelvis that correlates with the inhomogeneous area adjacent to the top of the vagina and the stump of distal colon visible on the recent CT scan. I suspect this could represent an abscess.  IMPRESSION: 1. Partial small bowel obstruction. The transition point is in the mid pelvis. 2. I suspect that the inhomogeneous area in the central portion of the pelvis on the prior CT scan is a multi-septated abscess rather than clumped small bowel.   Electronically Signed   By: Geanie Cooley M.D.   On: 05/09/2013 12:50    Procedures None this admission  Hospital Course:  62 year old patient of Dr. Johna Sheriff with a history of COPD, diabetes mellitus, anemia, s/p exploratory laparotomy on 03/16/13, previous ileostomy. She was hospitalized 03/27/13-04/06/13 due to a pelvic abscess which required IR drain placement. She was admitted again 11/25-11/26 with n/v and abdominal pain. She was found to have a decreasing intra-abdominal abscess unable  to be drained by IR. She improved overnight and was discharged home the following day. She was readmitted 12/1-12/5/14 for similar symptoms. She reports the same symptoms which started on 12/9-10/14, nausea and vomiting after she starts to eat.  She reports having a hard time swallowing pills as well. Her pain worsens the more she vomits or "dry heaves." She reports adequate output from her ostomy. She denies fever, chills or sweats. She reports that her appetite has been poor ever since her surgery in October. She has lost about 20 pounds in the last two months.   She was admitted for further workup/evaluation on 05/07/13.  A water soluble UGI with small bowel follow through contrast study was performed on 05/09/13 which showed a partial small bowel obstruction with a transition point in the mid pelvis.  She met with the dietitian who had many recommendation to help prevent N/V/abdominal pain upon discharge.  She tolerated 3 meals of clear liquids on HD #3-4 and was switched to full liquids which she tolerated for 4 meals prior to discharge HD #4-5. Pain is back to her baseline chronic pain and is well controlled.  On HD #6, the patient was voiding well, tolerating diet (fulls), good ostomy output, ambulating well, pain well controlled, vital signs stable, and felt stable for discharge home.  She will discharge home with [facility] nursing to help manage TPN until Dr. Johna Sheriff can further evaluate her in the office.  We would have preferred her to be on IV Invanz, but she has declined this due to high co-pays.  We will switch her to Augmentin which she has been on previously secondary to She will not be advanced past full liquids as she does not tolerate this well.  She will continue her PICC line and have HH nursing change dressing every 7 days and monitor for signs of infection.  We will maintain her on TPN, fulls, and protein supplements in hopes of being able to hold off on surgery until she is at least 3 months out from her recent exploratory laparotomy which will reduce the risk of fistulas and other surgical complications.  She may need a repeat CT scan in 10-14 days to check progress of pelvic abscess.  Patient will follow up in our office in 1-2 weeks  with Dr. Johna Sheriff and knows to call with questions or concerns.     Medication List         amoxicillin-clavulanate 875-125 MG per tablet  Commonly known as:  AUGMENTIN  Take 1 tablet by mouth 2 (two) times daily with a meal.     arformoterol 15 MCG/2ML Nebu  Commonly known as:  BROVANA  Take 15 mcg by nebulization 2 (two) times daily.     aspirin 81 MG tablet  Take 81 mg by mouth every Monday, Wednesday, and Friday.     budesonide 0.25 MG/2ML nebulizer solution  Commonly known as:  PULMICORT  Take 0.25 mg by nebulization 2 (two) times daily.     celecoxib 200 MG capsule  Commonly known as:  CELEBREX  Take 200 mg by mouth daily.     citalopram 20 MG tablet  Commonly known as:  CELEXA  Take 20 mg by mouth daily.     diltiazem 180 MG 24 hr capsule  Commonly known as:  CARDIZEM CD  Take 180 mg by mouth 2 (two) times daily.     famotidine 20 MG tablet  Commonly known as:  PEPCID  Take 20 mg by mouth 2 (  two) times daily.     feeding supplement (PRO-STAT SUGAR FREE 64) Liqd  Take 30 mLs by mouth daily.     fluticasone 50 MCG/ACT nasal spray  Commonly known as:  FLONASE  Place 2 sprays into the nose daily.     FLUTTER Devi  Inhale 2 application into the lungs every 4 (four) hours.     furosemide 20 MG tablet  Commonly known as:  LASIX  Take 20 mg by mouth 2 (two) times daily. Once or twice daily as needed for swelling     HYDROcodone-acetaminophen 5-325 MG per tablet  Commonly known as:  NORCO/VICODIN  Take 1-2 tablets by mouth every 4 (four) hours as needed for moderate pain.     HYDROcodone-acetaminophen 5-325 MG per tablet  Commonly known as:  NORCO/VICODIN  Take 1-2 tablets by mouth every 6 (six) hours as needed for moderate pain or severe pain.     hydroxychloroquine 200 MG tablet  Commonly known as:  PLAQUENIL  Take 200 mg by mouth Twice daily.     IRON PO  Take 1 tablet by mouth every Monday, Wednesday, and Friday.     LORazepam 0.5 MG tablet   Commonly known as:  ATIVAN  Take 0.5 mg by mouth every 8 (eight) hours as needed for anxiety.     metoprolol tartrate 25 MG tablet  Commonly known as:  LOPRESSOR  Take 0.5 tablets (12.5 mg total) by mouth 2 (two) times daily.     multivitamin with minerals Tabs tablet  Take 1 tablet by mouth daily.     omeprazole 40 MG capsule  Commonly known as:  PRILOSEC  Take 40 mg by mouth 2 (two) times daily.     ondansetron 4 MG tablet  Commonly known as:  ZOFRAN  Take 1 tablet (4 mg total) by mouth every 6 (six) hours as needed for nausea.     promethazine 25 MG/ML injection  Commonly known as:  PHENERGAN  Inject 0.5 mLs (12.5 mg total) into the vein every 6 (six) hours as needed for nausea or vomiting.     rosuvastatin 5 MG tablet  Commonly known as:  CRESTOR  Take 5 mg by mouth every Monday, Wednesday, and Friday.     sodium chloride 0.9 % SOLN 50 mL with ertapenem 1 G SOLR 1 g  Inject 1 g into the vein daily.     tiotropium 18 MCG inhalation capsule  Commonly known as:  SPIRIVA  Place 18 mcg into inhaler and inhale daily.     VICTOZA 18 MG/3ML Soln injection  Generic drug:  Liraglutide  Inject 1.8 mg into the skin daily. 1.8 units once daily     Vitamin D 1000 UNITS capsule  Take 1,000 Units by mouth daily.      You will take a 1/2 dose of TPN daily at night (cyclical) until further evaluated by Dr. Johna Sheriff.       Follow-up Information   Follow up with HOXWORTH,BENJAMIN T, MD. Schedule an appointment as soon as possible for a visit in 1 week.   Specialty:  General Surgery   Contact information:   [address] [place] [address] [phone]       Follow up with [name], MD. (PLEASE FOLLOW UP WITH YOUR PRIMARY CARE PROVDER REGARDING YOUR THYROID AND ANEMIA)    Specialty:  Internal Medicine   Contact information:   [address] [place] [address] [phone]  Signed: Aris Georgia, [facility]  Surgery [phone]  05/11/2013, 10:24 AM

## 2013-05-11 NOTE — Progress Notes (Signed)
PARENTERAL NUTRITION CONSULT NOTE  Pharmacy Consult for TPN Indication: intolerance to enteral feeds, severe weight loss in past few months  Allergies  Allergen Reactions  . Esomeprazole Magnesium Other (See Comments)    NEXIUM - reaction > aggravated pt's Crohn's disease  . Shellfish Allergy Shortness Of Breath and Nausea And Vomiting  . Surgical Lubricant Other (See Comments)    Burns skin   . Other Nausea And Vomiting    Beans, Dander/Dust, Peas, Mushrooms  . Peanut-Containing Drug Products Nausea And Vomiting  . Levaquin [Levofloxacin]     Patient Measurements: Height: 5\' 1"  (154.9 cm) Weight: 95 lb 3.8 oz (43.2 kg) IBW/kg (Calculated) : 47.8 Usual Weight: patient with significant weight loss in past 2 months. She was 110lbs on 12/2 and at that time, RD Maureen Chatters had assessed her as meeting criteria for severe malnutrition. Has lost more weight since that assessment. 12/13 weight on admit was 37kg, 12/14 weight is 44.5kg- doubt she gained that overnight. Likely difference in bed scales.  Vital Signs: Temp: 97.8 F (36.6 C) (12/17 0633) Temp src: Oral (12/17 0633) BP: 90/52 mmHg (12/17 0647) Pulse Rate: 86 (12/17 0647) Intake/Output from previous day: 12/16 0701 - 12/17 0700 In: 2385.8 [P.O.:360; I.V.:840; IV Piggyback:50; TPN:1135.8] Out: 1675 [Urine:1350; Stool:325] Intake/Output from this shift:    Labs:  Recent Labs  05/09/13 0458  WBC 9.4  HGB 7.7*  HCT 23.7*  PLT 250     Recent Labs  05/09/13 0458 05/10/13 0532  NA 140  --   K 3.9  --   CL 107  --   CO2 24  --   GLUCOSE 97  --   BUN 4*  --   CREATININE 0.66  --   CALCIUM 8.6  --   MG 1.8 1.8  PHOS 3.0  --   PROT 5.6*  --   ALBUMIN 2.0*  --   AST 9  --   ALT <5  --   ALKPHOS 61  --   BILITOT 0.1*  --   PREALBUMIN 7.8*  --   TRIG 93  --    Estimated Creatinine Clearance: 49.1 ml/min (by C-G formula based on Cr of 0.66).    Recent Labs  05/10/13 1950 05/10/13 2351  05/11/13 0404  GLUCAP 96 102* 95    Insulin Requirements in the past 12 hours:  None  Current Nutrition:  Clear liquid diet -- eating 40-75% of meals. No plans to advance diet further per surgery Clinimix E 5/15 at 37mL/hr and lipid emulsion 20% at 5 mL/hr provides 922 kcal and 48 grams protein per day. * Goal rate of Clinimix E 5/15 at 65 ml/hr + 20% lipids at 7 ml/hr will provide 1444 kcal and 78 grams of protein per day   Nutritional Goals:  1400-1600 kCal, 70-80 grams of protein, >/= 1.5L fluid daily per RD recommendations 12/15   Assessment: 28 YOF with Crohn's disease S/p ex lap 10/22 with previous ileostomy. Hospitalized 11/2-11/12 due to pelvic abscess which required drain placement. Admitted again 11/25-11/26 and 12/1-12/5 both times with n/v/abdominal pain. Now presenting again with similar complaints.  GI: Complex GI/surgical history (see above). UGI on 12/15 showed PSBO likely from adhesion. Ileostomy with good output. Tolerating clears -- eating 40-75%. Diet advanced to full liquids today (12/16). If tolerated -- can be d/ced home and have surgery at a later time. If not tolerated -- will require surgery this admission. Per discussion with surgery PA -- pt to be d/ced home  on TPN, will not advance diet further until after surgery. Discussed with RD and will increase back to goal rate to provide optimal nutrition while awaiting future surgery. Will plan to cycle the TPN over 12 hours.  On IV PPI. Last BM charted on 12/16 Endo: Hx DM. A1c 5.5. CBGs/24h: 92-102. On sensitive SSI Lytes: Na 140, K 3.9, Mg 1.8, Phos 3, CoCa~10.1 -- noted high risk for refeeding d/t FTT however electrolytes remain stable Renal: SCr 0.66, CrCl~50 ml/min. UOP/24h: 1.5 ml/kg/hr.  Pulm: Hx chronic bronchitis. 100% on 2 L n.c. On brovana, pulmicort, spiriva Cards: Hx HTN. BP soft-wnl, HR wnl. On diltiazem, lopressor Hepatobil: LFTs wnl, Tbili/Alk Phos wnl, TG 93, Alb 2, pre-albumin 7.8 (12/15) Neuro: Hx  anxiety/depression. On celexa ID: Ertapenem D#5 for empiric abd coverage in the setting of pelvic fluid collection. Afebrile, WBC wnl Best Practices: Enox, PPI IV TPN Access: Double lumen PICC placed 12/13 TPN day#: 4 (12/13 >> current)  Plan - The patient will transition to cyclic TPN upon discharge today per discussion with Tamera Punt (pharmacist) at [facility]. The goal rate for Clinimix and nutritional goals were discussed with Tamera Punt -- she plans to cycle over 18 hours then evening, then transition to 12 hour cycling. They will make her TPN solution at [facility] this evening -- we will not be making it here at [facility].  Georgina Pillion, PharmD, BCPS Clinical Pharmacist Pager: [phone] 05/11/2013 7:38 AM

## 2013-05-11 NOTE — Progress Notes (Signed)
Discharge instructions gone over with patient. Prescriptions given. Follow up appointment is made. Diet discussed. Patient verbalized understanding of instructions.

## 2013-05-11 NOTE — Progress Notes (Signed)
NUTRITION FOLLOW UP  Intervention:    1. Increase TPN to full support for home.  2. Reviewed full liquid diet options. Encouraged pt to continue Prostat. Gave pt contact information.   Nutrition Dx:   Malnutrition related to chronic illness as evidenced by 20% weight loss x 2 months and intake of </= 75% of her needs for >/= 1 month; ongoing.    Goal:  Pt to meet >/= 90% of their estimated nutrition needs; met.   Monitor:  Diet advancement, PO intake, weight trend, labs   Assessment:   Hx Crohn's disease s/p Hartman's with ileostomy in 2007  Exploratory laparotomy with LOA, Incisional and parastomal hernia repair and insertion of biologic mesh 03/16/13 Dr. Johna Sheriff. Pt admitted with PSBO. Pt severely malnourished. Plan for pt to d/c with TPN and possibly have surgery in a few weeks if PSBO is not resolved. Pt is very limited in what she can eat on a full liquid diet.Today for Breakfast pt consumed her juice and ice cream. Pt is lactose intolerant and is "terrified" to try lactose free milk. Pt does not like any of our supplements (Ensure, Boost, Breeze). Pt is taking her Prostat daily and plans to continue to do this after d/c. Discussed where pt could buy.    Height: Ht Readings from Last 1 Encounters:  05/07/13 5\' 1"  (1.549 m)    Weight Status:   Wt Readings from Last 1 Encounters:  05/11/13 95 lb 3.8 oz (43.2 kg)    Re-estimated needs:  Kcal: 1400-1600  Protein: 70-80 gm  Fluid: >/= 1.5 L  Skin: no issues noted  Diet Order: Full Liquid Meal Completion: 25%   Intake/Output Summary (Last 24 hours) at 05/11/13 1248 Last data filed at 05/11/13 0600  Gross per 24 hour  Intake 2145.83 ml  Output   1175 ml  Net 970.83 ml    Last BM: via colostomy 325 ml 12/16  Labs:   Recent Labs Lab 05/06/13 1924  05/08/13 0445 05/09/13 0458 05/10/13 0532  NA 133*  --  137 140  --   K 4.1  --  3.5 3.9  --   CL 96  --  105 107  --   CO2 22  --  24 24  --   BUN 22  --   6 4*  --   CREATININE 0.98  --  0.73 0.66  --   CALCIUM 9.7  --  8.5 8.6  --   MG  --   < > 1.4* 1.8 1.8  PHOS  --   --  3.0 3.0  --   GLUCOSE 88  --  107* 97  --   < > = values in this interval not displayed.  CBG (last 3)   Recent Labs  05/10/13 2351 05/11/13 0404 05/11/13 0800  GLUCAP 102* 95 92    Scheduled Meds: . arformoterol  15 mcg Nebulization BID  . budesonide  0.25 mg Nebulization BID  . citalopram  20 mg Oral Daily  . diltiazem  180 mg Oral BID  . enoxaparin (LOVENOX) injection  30 mg Subcutaneous Daily  . ertapenem (INVANZ) IV  1 g Intravenous Daily  . feeding supplement (PRO-STAT SUGAR FREE 64)  30 mL Oral Daily  . fluticasone  2 spray Each Nare Daily  . hydroxychloroquine  200 mg Oral BID  . insulin aspart  0-9 Units Subcutaneous Q4H  . metoprolol tartrate  12.5 mg Oral BID  . pantoprazole (PROTONIX) IV  40 mg  Intravenous QHS  . sodium chloride  10-40 mL Intracatheter Q12H  . tiotropium  18 mcg Inhalation Daily    Continuous Infusions: . sodium chloride 35 mL/hr at 05/10/13 1811  . Marland KitchenTPN (CLINIMIX-E) Adult 40 mL/hr at 05/10/13 1710   And  . fat emulsion 250 mL (05/10/13 1710)    Kendell Bane RD, LDN, CNSC [phone] Pager [phone] After Hours Pager

## 2013-05-11 NOTE — Progress Notes (Signed)
Subjective: Pt tolerated 3 meals of full liquids well.  Having BM's out ostomy and flatus, no nausea/vomiting.  Ambulating well.  Urinating well.  Wants to talk to dietitian about foods she can eat at discharge on full liquid diet.  Objective: Vital signs in last 24 hours: Temp:  [97.8 F (36.6 C)-98.8 F (37.1 C)] 97.8 F (36.6 C) (12/17 1610) Pulse Rate:  [80-94] 86 (12/17 0647) Resp:  [17-18] 17 (12/17 0633) BP: (89-98)/(34-56) 90/52 mmHg (12/17 0647) SpO2:  [100 %] 100 % (12/17 0916) Weight:  [95 lb 3.8 oz (43.2 kg)] 95 lb 3.8 oz (43.2 kg) (12/17 9604) Last BM Date: 05/10/13 (via ostomy)  Intake/Output from previous day: 12/16 0701 - 12/17 0700 In: 2385.8 [P.O.:360; I.V.:840; IV Piggyback:50; TPN:1135.8] Out: 1675 [Urine:1350; Stool:325] Intake/Output this shift:    PE: Gen:  Alert, NAD, pleasant Abd: Soft, mild tenderness (but always tender, no tenderness with legs bent), ND, +BS, no HSM, abdominal scars noted, ostomy with pinkish brownish/green output, flatus and BM in bag.   Lab Results:   Recent Labs  05/09/13 0458  WBC 9.4  HGB 7.7*  HCT 23.7*  PLT 250   BMET  Recent Labs  05/09/13 0458  NA 140  K 3.9  CL 107  CO2 24  GLUCOSE 97  BUN 4*  CREATININE 0.66  CALCIUM 8.6   PT/INR No results found for this basename: LABPROT, INR,  in the last 72 hours CMP     Component Value Date/Time   NA 140 05/09/2013 0458   K 3.9 05/09/2013 0458   CL 107 05/09/2013 0458   CO2 24 05/09/2013 0458   GLUCOSE 97 05/09/2013 0458   BUN 4* 05/09/2013 0458   CREATININE 0.66 05/09/2013 0458   CREATININE 1.04 08/27/2012 1054   CALCIUM 8.6 05/09/2013 0458   CALCIUM 9.0 07/15/2011 0920   PROT 5.6* 05/09/2013 0458   ALBUMIN 2.0* 05/09/2013 0458   AST 9 05/09/2013 0458   ALT <5 05/09/2013 0458   ALKPHOS 61 05/09/2013 0458   BILITOT 0.1* 05/09/2013 0458   GFRNONAA >90 05/09/2013 0458   GFRAA >90 05/09/2013 0458   Lipase     Component Value Date/Time   LIPASE 19  05/06/2013 1924       Studies/Results: Dg Ugi W/small Bowel  05/09/2013   CLINICAL DATA:  Abdominal pain.  Crohn's disease.  Ileostomy.  EXAM: UPPER GI SERIES WITH SMALL BOWEL FOLLOW-THROUGH  TECHNIQUE: Combined double contrast and single contrast upper GI series using effervescent crystals, thick barium, and thin barium. Subsequently, serial images of the small bowel were obtained including spot views of the small bowel.  COMPARISON:  CT scan dated 05/06/2013 and upper GI with small-bowel follow-through dated 10/13/2009  FLUOROSCOPY TIME:  1 min 44 seconds  FINDINGS: Vascular coils are seen in the mid abdomen. There dilated loops of small bowel seen in the left side of the abdomen on the scout image. The patient slowly sipped on barium. The esophagus appears normal. The stomach and pylorus and duodenal bulb appear normal. C-loop appears normal.  Small bowel transit time was 2 hr.  There are multiple dilated loops of small bowel in the left mid abdomen and in the central portion of the pelvis with tethering of these bowel loops. The distal ileum appears normal. Ileostomy appears normal. No definable fistulae. The transition point is visible on the 45 min image of the abdomen.  I do not see an area low in the pelvis that correlates with the inhomogeneous area  adjacent to the top of the vagina and the stump of distal colon visible on the recent CT scan. I suspect this could represent an abscess.  IMPRESSION: 1. Partial small bowel obstruction. The transition point is in the mid pelvis. 2. I suspect that the inhomogeneous area in the central portion of the pelvis on the prior CT scan is a multi-septated abscess rather than clumped small bowel.   Electronically Signed   By: Geanie Cooley M.D.   On: 05/09/2013 12:50    Anti-infectives: Anti-infectives   Start     Dose/Rate Route Frequency Ordered Stop   05/07/13 1000  hydroxychloroquine (PLAQUENIL) tablet 200 mg     200 mg Oral 2 times daily 05/07/13 0409      05/07/13 0500  ertapenem (INVANZ) 1 g in sodium chloride 0.9 % 50 mL IVPB     1 g 100 mL/hr over 30 Minutes Intravenous Daily 05/07/13 0409         Assessment/Plan Hx Crohn's disease s/p Hartman's with ileostomy in 2007  Exploratory laparotomy with LOA, Incisional and parastomal hernia repair and insertion of biologic mesh 03/16/13 Dr. Johna Sheriff  Nausea, vomiting and abdominal pain with oral intake since October  -she has a partial SBO with a transition point in the mid pelvis on upper GI. She will require surgery for this, however, she is 7 weeks out from her last surgery and therefore at a high risk of complications including fistulas etc.  -Tolerating full liquids, will discharge with TPN and direction to continue with only full liquids.  Dr. Johna Sheriff can advance her further after her follow up with him in 1-2 weeks. Pelvic fluid collection 4x3.6x2.7cm  -IR unable to drain in previous hospitalization consultation  -continue with Invanz D#5 -WBC normal - continue Invanz until f/u with Dr. Johna Sheriff PCM/FTT/Inability to take PO well -continue with TPN, protein supplements, and full liquids Chronic Anemia  -Pending folate  -TIBC low and iron at low end of normal (not iron deficiency currently - DD: hemolytic anemia, hypoproteinemia, liver disease, malnutrition, b12 anemia, and sickle cell anemia) -hemoccult positive, but no gross blood in stools may be from inflammatory process COPD  -home meds, PRN 02  Hypertension  -stable  Type II diabetes Mellitus  -CBGs, insulin  Hyperthyroidism  -TSH .120. Free T4 normal at 1.03, likely subclinical, f/u with PCP upon discharge  Sent epic message to PCP regarding follow up needed.    LOS: 5 days    Aris Georgia 05/11/2013, 9:52 AM Pager: [phone]

## 2013-05-11 NOTE — Care Management Note (Signed)
  Page 1 of 1   05/11/2013     11:09:58 AM   CARE MANAGEMENT NOTE 05/11/2013  Patient:  [name], [name]   Account Number:  [id number]  Date Initiated:  05/11/2013  Documentation initiated by:  Ronny Flurry  Subjective/Objective Assessment:     Action/Plan:   Anticipated DC Date:  05/11/2013   Anticipated DC Plan:  HOME W HOME HEALTH SERVICES         Choice offered to / List presented to:  C-1 Patient        HH arranged  HH-1 RN      [facility] agency  [facility]   Status of service:  Completed, signed off Medicare Important Message given?   (If response is "NO", the following Medicare IM given date fields will be blank) Date Medicare IM given:   Date Additional Medicare IM given:    Discharge Disposition:    Per UR Regulation:    If discussed at Long Length of Stay Meetings, dates discussed:    Comments:  05-11-13 Patient discharging with IV TPN and Invanz ( already received today's dose) . Patient states [facility] was going to deliver ostomy supplies , but patient was readmited to hospital same day . Patient needs supplies , left message for [facility] and asked bedside nurse Steward Drone to send extra supplies home with patient . Ronny Flurry RN BSN [phone]

## 2013-05-13 LAB — CULTURE, BLOOD (ROUTINE X 2)

## 2013-05-27 ENCOUNTER — Telehealth (INDEPENDENT_AMBULATORY_CARE_PROVIDER_SITE_OTHER): Payer: Self-pay

## 2013-05-27 NOTE — Telephone Encounter (Signed)
Called pt and let her know we cannot DC TPN and advance diet until she is seen. She requests sooner appt so she does not have to wait another month to start eating solid food. Moved her up for next Friday.

## 2013-05-27 NOTE — Telephone Encounter (Signed)
Message copied by Brennan Bailey on Fri May 27, 2013 12:20 PM ------      Message from: Leanne Chang      Created: Fri May 27, 2013 11:41 AM      Regarding: Dr Johna Sheriff      Contact: [phone]       How long will she have to be on the TNP? When can she start eating more food? ------

## 2013-05-28 ENCOUNTER — Emergency Department (HOSPITAL_BASED_OUTPATIENT_CLINIC_OR_DEPARTMENT_OTHER)
Admission: EM | Admit: 2013-05-28 | Discharge: 2013-05-28 | Discharge status: Against Medical Advice | Payer: Medicare Other

## 2013-05-30 NOTE — Telephone Encounter (Signed)
errooneous encounter

## 2013-06-02 ENCOUNTER — Other Ambulatory Visit: Payer: Self-pay | Admitting: Critical Care Medicine

## 2013-06-02 ENCOUNTER — Other Ambulatory Visit: Payer: Self-pay | Admitting: Cardiovascular Disease

## 2013-06-03 ENCOUNTER — Encounter (INDEPENDENT_AMBULATORY_CARE_PROVIDER_SITE_OTHER): Payer: Self-pay | Admitting: General Surgery

## 2013-06-03 ENCOUNTER — Ambulatory Visit (INDEPENDENT_AMBULATORY_CARE_PROVIDER_SITE_OTHER): Payer: Medicare Other | Admitting: General Surgery

## 2013-06-03 VITALS — BP 124/62 | HR 68 | Temp 98.0°F | Resp 18 | Ht 61.0 in | Wt 115.0 lb

## 2013-06-03 DIAGNOSIS — Z09 Encounter for follow-up examination after completed treatment for conditions other than malignant neoplasm: Secondary | ICD-10-CM

## 2013-06-03 NOTE — Progress Notes (Signed)
Chief complaint: Followup laparotomy for bowel obstruction and hernia repair  History: Patient returns to the office following the surgery in November. She has a history of Crohn's disease status post total colectomy with ileostomy and rectal pouch done in [place] a number of years ago. She also has severe chronic respiratory disease. On home oxygen. She presented with small bowel obstruction and I operated on her in November with a complete obstruction due to pelvic adhesion down to her rectal stump. Also repaired incisional and parastomal hernias at that time. 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 This was treated with percutaneous drainage and she again improved and was discharged but was readmitted with ongoing abdominal pain and food intolerance and on upper GI series was found to have a partial small bowel obstruction. The abscess was resolving by CT scan. This was in early December. She was started on TNA and was discharged on December 19 on home TNA. and oral antibiotics and fluids only by mouth. She states that over the last 3 weeks she has been feeling significantly better. She has felt hungry and has experimented on a few occasions with a small amount of solid food and tolerated this well. She currently denies any abdominal pain or nausea or fever. Ileostomy is working well.  Exam: BP 124/62  Pulse 68  Temp(Src) 98 F (36.7 C)  Resp 18  Ht 5\' 1"  (1.549 m)  Wt 115 lb (52.164 kg)  BMI 21.74 kg/m2 General: Thin somewhat chronically ill-appearing American female in no distress and in good spirits Abdomen: Nondistended. Soft and nontender. Incision well-healed. No hernias.  Assessment and plan: Complex patient with a long history of Crohn's disease, recent small bowel obstruction and pelvic abscess and subsequent partial  small bowel obstruction. She seems to be definitely improved. We'll go ahead and let her liberalize her diet with soft or chopped food. She will avoid anything for stringy. We spent a long time going over diet choices. She has been unable to tolerate any sort of protein shake in the past due to her Crohn's disease. Continue T nA for now while we see she does with her diet. She is off of antibiotics and no evidence of infection. I would not repeat a CT scan unless we have subclinical reason. Return in 4 weeks.

## 2013-06-03 NOTE — Patient Instructions (Signed)
Begin to slowly add very soft or well cooked and chopped food. No roughage or tuff or stringy food.

## 2013-06-03 NOTE — Telephone Encounter (Signed)
Rx was sent to pharmacy electronically. 

## 2013-06-04 ENCOUNTER — Telehealth: Payer: Self-pay | Admitting: Pulmonary Disease

## 2013-06-04 NOTE — Telephone Encounter (Signed)
C/o tightness in chest Albuterol MDI called in (proventil)

## 2013-06-06 ENCOUNTER — Other Ambulatory Visit: Payer: Self-pay | Admitting: Critical Care Medicine

## 2013-06-06 MED ORDER — PREDNISONE 10 MG PO TABS: ORAL_TABLET | ORAL | Status: DC

## 2013-06-06 NOTE — Telephone Encounter (Signed)
Spoke with the pt  She states has appt with PW on 06/15/13 I offered sooner appt, but she refused b/c needed am only and nothing was open  I have called in pred taper and she will let us know if not improving

## 2013-06-06 NOTE — Telephone Encounter (Signed)
Pt is calling back stating she needs something else to help her breathing [phone]   [facility] [phone]

## 2013-06-06 NOTE — Telephone Encounter (Signed)
[email or web address].Jennifer Castillo, CMA  

## 2013-06-06 NOTE — Telephone Encounter (Signed)
I spoke with the pt and she states that she been having increased chest tightness, wheezing x 2 days. Pt spoke with Dr. Vassie Loll on call and he called in albuterol inhaler for her. She states she has been using this and albuterol neb treatment and this has helped her chest tightness some, but does not make it go away completely. Pt states she is having a productive cough but this is no worse then her normal. Pt states her home health nurse also listened to her chest yesterday and commented that it sounded "tight." Please advise. Carron Curie, CMA Allergies  Allergen Reactions  . Esomeprazole Magnesium Other (See Comments)    NEXIUM - reaction > aggravated pt's Crohn's disease  . Shellfish Allergy Shortness Of Breath and Nausea And Vomiting  . Surgical Lubricant Other (See Comments)    Burns skin   . Other Nausea And Vomiting    Beans, Dander/Dust, Peas, Mushrooms  . Peanut-Containing Drug Products Nausea And Vomiting  . Levaquin [Levofloxacin]

## 2013-06-06 NOTE — Telephone Encounter (Signed)
Needs OV ASAP In meantime call in prednisone 10mg . Take 4 for three days 3 for three days 2 for three days 1 for three days and stop #30

## 2013-06-08 ENCOUNTER — Other Ambulatory Visit: Payer: Self-pay | Admitting: Critical Care Medicine

## 2013-06-09 NOTE — Telephone Encounter (Signed)
RX was called in 06/02/13 for this RX. i called [facility]-spoke with Marylene Land and advised this was received and pt did pick this up. Nothing further needed

## 2013-06-10 ENCOUNTER — Other Ambulatory Visit: Payer: Self-pay | Admitting: Critical Care Medicine

## 2013-06-13 NOTE — Telephone Encounter (Signed)
Received [facility]azepam [facility] from [facility].  [facility] on 06/[facility]/13 #60 x 0.  This should be enough for a 20 day supply.   Called [facility], spoke with Maureen Ralphs.  They did receive the lorazepam rx from 06/03/13.  Pt has already picked this up.  It was picked up on 06/04/13.  Per Maureen Ralphs, this request was an automated requested --- nothing is needed; pt is not requesting rx. Rx refused to pharm based on this information.  Maureen Ralphs aware - nothing further needed.

## 2013-06-14 ENCOUNTER — Other Ambulatory Visit: Payer: Self-pay | Admitting: Critical Care Medicine

## 2013-06-15 ENCOUNTER — Ambulatory Visit (INDEPENDENT_AMBULATORY_CARE_PROVIDER_SITE_OTHER): Payer: Medicare Other | Admitting: Critical Care Medicine

## 2013-06-15 ENCOUNTER — Telehealth: Payer: Self-pay | Admitting: Critical Care Medicine

## 2013-06-15 ENCOUNTER — Encounter: Payer: Self-pay | Admitting: Critical Care Medicine

## 2013-06-15 VITALS — BP 112/66 | HR 98 | Temp 98.0°F | Ht 61.0 in | Wt 117.0 lb

## 2013-06-15 DIAGNOSIS — J438 Other emphysema: Secondary | ICD-10-CM

## 2013-06-15 DIAGNOSIS — J439 Emphysema, unspecified: Secondary | ICD-10-CM

## 2013-06-15 NOTE — Patient Instructions (Signed)
No change in medications. Return in        2 months 

## 2013-06-15 NOTE — Telephone Encounter (Signed)
Pt was seen in office today.  We spoke about the lorazepam.  I have received refill request for this.  It is too soon for refill as it was given on 06/03/13 for a 20 day supply if pt taking it 3 x daily.    I spoke with pt.  She does have medication left.  She will call back when she gets closer to needing a refill.  Nothing further needed at this time.

## 2013-06-15 NOTE — Progress Notes (Signed)
Subjective:    Patient ID: [name], female    DOB: 1950/10/27, 63 y.o.   MRN: [id number]  HPI  63 y.o.[name]  woman with severe COPD Golds Stage D   CAT Score 01/19/2013 07/07/2012 05/11/2012 04/27/2012  Total CAT Score 19 21 14 24    06/15/2013 Chief Complaint  Patient presents with  . Follow-up    Pred was sent in on Jan 12.   Reports breathing is doing much better since this.  Does cough with clear mucus.  No wheezing or chest tightness/pain.  Would like to discuss lorazepam increase.  Pt in and out of the hosp 5 x in 2 months.  Pt last in hosp 12/19. Pt kept getting bowel obstructions and two abscesses.  Protein cal malnutritions.  Made major changes in diet, less oxygen diet.  Scar tissue led to bowel obstruction, from prior colon surgeries, pt has a ostomy since 2007  On last  Office note surgery:   She has a history of Crohn's disease status post total colectomy with ileostomy and rectal pouch done in [place] a number of years ago. She also has severe chronic respiratory disease. On home oxygen. She presented with small bowel obstruction and I operated on her in November with a complete obstruction due to pelvic adhesion down to her rectal stump. Also repaired incisional and parastomal hernias at that time. 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 This was treated with percutaneous drainage and she again improved and was discharged but was readmitted with ongoing abdominal pain and food intolerance and on upper GI series was found to have a partial small bowel obstruction. The abscess was resolving by CT scan. This was in early December. She was started on TNA and was discharged on December 19 on home TNA. and oral antibiotics and fluids only by mouth. She states that over the last 3 weeks she has been feeling  significantly better. She has felt hungry and has experimented on a few occasions with a small amount of solid food and tolerated this well. She currently denies any abdominal pain or nausea or fever. Ileostomy is working well.    Pt had two surgeries and a drain by IR, and ABX and TPN given and still gets TPN. PICC line. OnTPN during the day, also eats soft foods Now on 2.5L of oxygen 24/7 Pt with [employer] network Just finishing pred pulse No wheeze , sl mucus in the AM. Pt denies any significant sore throat, nasal congestion or excess secretions, fever, chills, sweats, unintended weight loss, pleurtic or exertional chest pain, orthopnea PND, or leg swelling Pt denies any increase in rescue therapy over baseline, denies waking up needing it or having any early am or nocturnal exacerbations of coughing/wheezing/or dyspnea. Pt also denies any obvious fluctuation in symptoms with  weather or environmental change or other alleviating or aggravating factors    Review of Systems  11 pt ros neg except per hpi    Objective:   Physical Exam   Filed Vitals:   06/15/13 1002  BP: 112/66  Pulse: 98  Temp: 98 F (36.7 C)  TempSrc: Oral  Height: 5\' 1"  (1.549 m)  Weight: 117 lb (53.071 kg)  SpO2: 100%    Gen: Pleasant, well-nourished, in no distress,  normal affect  ENT: No lesions,  mouth clear,  oropharynx  clear, no postnasal drip  Neck: No JVD, no TMG, no carotid bruits  Lungs: No use of accessory muscles, no dullness to percussion, distant BS  Cardiovascular: RRR, heart sounds normal, no murmur or gallops, no peripheral edema, PICC line in place   Abdomen: soft and NT, no HSM,  BS normal, ileostomy in place   Musculoskeletal: No deformities, no cyanosis or clubbing  Neuro: alert, non focal  Skin: Warm, no lesions or rashes      Assessment & Plan:   Gold stage D. 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 Amino Acids-Protein Hydrolys (FEEDING SUPPLEMENT, PRO-STAT SUGAR FREE 64,) LIQD Take 30 mLs by mouth daily.  [name] arformoterol (BROVANA) 15 MCG/2ML NEBU Take 15 mcg by nebulization 2 (two) times daily.  [name] aspirin 81 MG tablet Take 81 mg by mouth every Monday, Wednesday, and Friday.   . budesonide (PULMICORT) 0.25 MG/2ML nebulizer solution Take 0.25 mg by nebulization 2 (two) times daily.  . celecoxib (CELEBREX) 200 MG capsule Take 200 mg by mouth daily.  . Cholecalciferol (VITAMIN D) 1000 UNITS capsule Take 1,000 Units by mouth daily.   . citalopram (CELEXA) 20 MG tablet Take 20 mg by mouth daily.   [name] diltiazem (CARDIZEM CD) 180 MG 24 hr capsule TAKE 1 CAPSULE (180 MG TOTAL) BY MOUTH 2 (TWO) TIMES DAILY.  . famotidine (PEPCID) 20 MG tablet Take 20 mg by mouth 2 (two) times daily.  . fluticasone (FLONASE) 50 MCG/ACT nasal spray Place 2 sprays into the nose daily as needed.   . furosemide (LASIX) 20 MG tablet Take 20 mg by mouth 2 (two) times daily. Once or twice daily as needed for swelling  . HYDROcodone-acetaminophen (NORCO/VICODIN) 5-325 MG per tablet Take 1-2 tablets by mouth every 6 (six) hours as needed for moderate pain or severe pain.  . hydroxychloroquine (PLAQUENIL) 200 MG tablet Take 200 mg by mouth Twice daily.   . IRON PO Take 1 tablet by mouth every Monday, Wednesday, and Friday.   . Liraglutide (VICTOZA) 18 MG/3ML SOLN Inject 1.8 mg into the skin daily. 1.8 units once daily  . LORazepam (ATIVAN) 0.5 MG tablet TAKE 1 TABLET EVERY 8 HOURS AS NEEDED FOR ANXIETY  . omeprazole (PRILOSEC) 40 MG capsule Take 40 mg by mouth 2 (two) times daily.   . ondansetron (ZOFRAN) 4 MG tablet Take 1 tablet (4 mg total) by mouth every 6 (six) hours as needed for nausea.  . predniSONE  (DELTASONE) 10 MG tablet 4 x 3 days, 3 x 3 days, 2 x 3 days, 1 x 3 days then stop  . PROAIR HFA 108 (90 BASE) MCG/ACT inhaler Inhale 2 puffs into the lungs every 6 (six) hours as needed.  [name] Respiratory Therapy Supplies (FLUTTER) DEVI Inhale into the lungs. 2-4 times daily  . rosuvastatin (CRESTOR) 5 MG tablet Take 5 mg by mouth every Monday, Wednesday, and Friday.   . tiotropium (SPIRIVA) 18 MCG inhalation capsule Place 18 mcg into inhaler and inhale daily.  . [DISCONTINUED] HYDROcodone-acetaminophen (NORCO/VICODIN) 5-325 MG per tablet Take 1-2 tablets by mouth every 4 (four) hours as needed for moderate pain.  . metoprolol tartrate (LOPRESSOR) 25 MG tablet ON HOLD  . [DISCONTINUED] amoxicillin-clavulanate (AUGMENTIN) 875-125 MG per tablet Take  1 tablet by mouth 2 (two) times daily with a meal.  . [DISCONTINUED] metoprolol tartrate (LOPRESSOR) 25 MG tablet Take 0.5 tablets (12.5 mg total) by mouth 2 (two) times daily.  . [DISCONTINUED] Multiple Vitamin (MULTIVITAMIN WITH MINERALS) TABS Take 1 tablet by mouth daily.  . [DISCONTINUED] promethazine (PHENERGAN) 25 MG/ML injection Inject 0.5 mLs (12.5 mg total) into the vein every 6 (six) hours as needed for nausea or vomiting.  . [DISCONTINUED] sodium chloride 0.9 % SOLN 50 mL with ertapenem 1 G SOLR 1 g Inject 1 g into the vein daily.

## 2013-06-15 NOTE — Telephone Encounter (Signed)
Pt was seen in office today.  On 06/03/13, lorazepam was called in for at least 20 days.  Pt states she has 11 days left of this medication.  She will call back the middle of next week for refill as it is too soon right now.

## 2013-06-16 NOTE — Assessment & Plan Note (Signed)
Gold D Copd stable at present  Recent hosp stay for small bowel obstruction and pelvic abscess s/p drainage and ileostomy Plan Cont current inhaled meds Cont oxygen

## 2013-06-20 IMAGING — CR DG CHEST 2V
2 series · 2 of 2 positions shown · non-contrast
Comparison: 03/12/2009

CLINICAL DATA: SOB.  Labored breathing.

CHEST - 2 VIEW

[w chest pa]
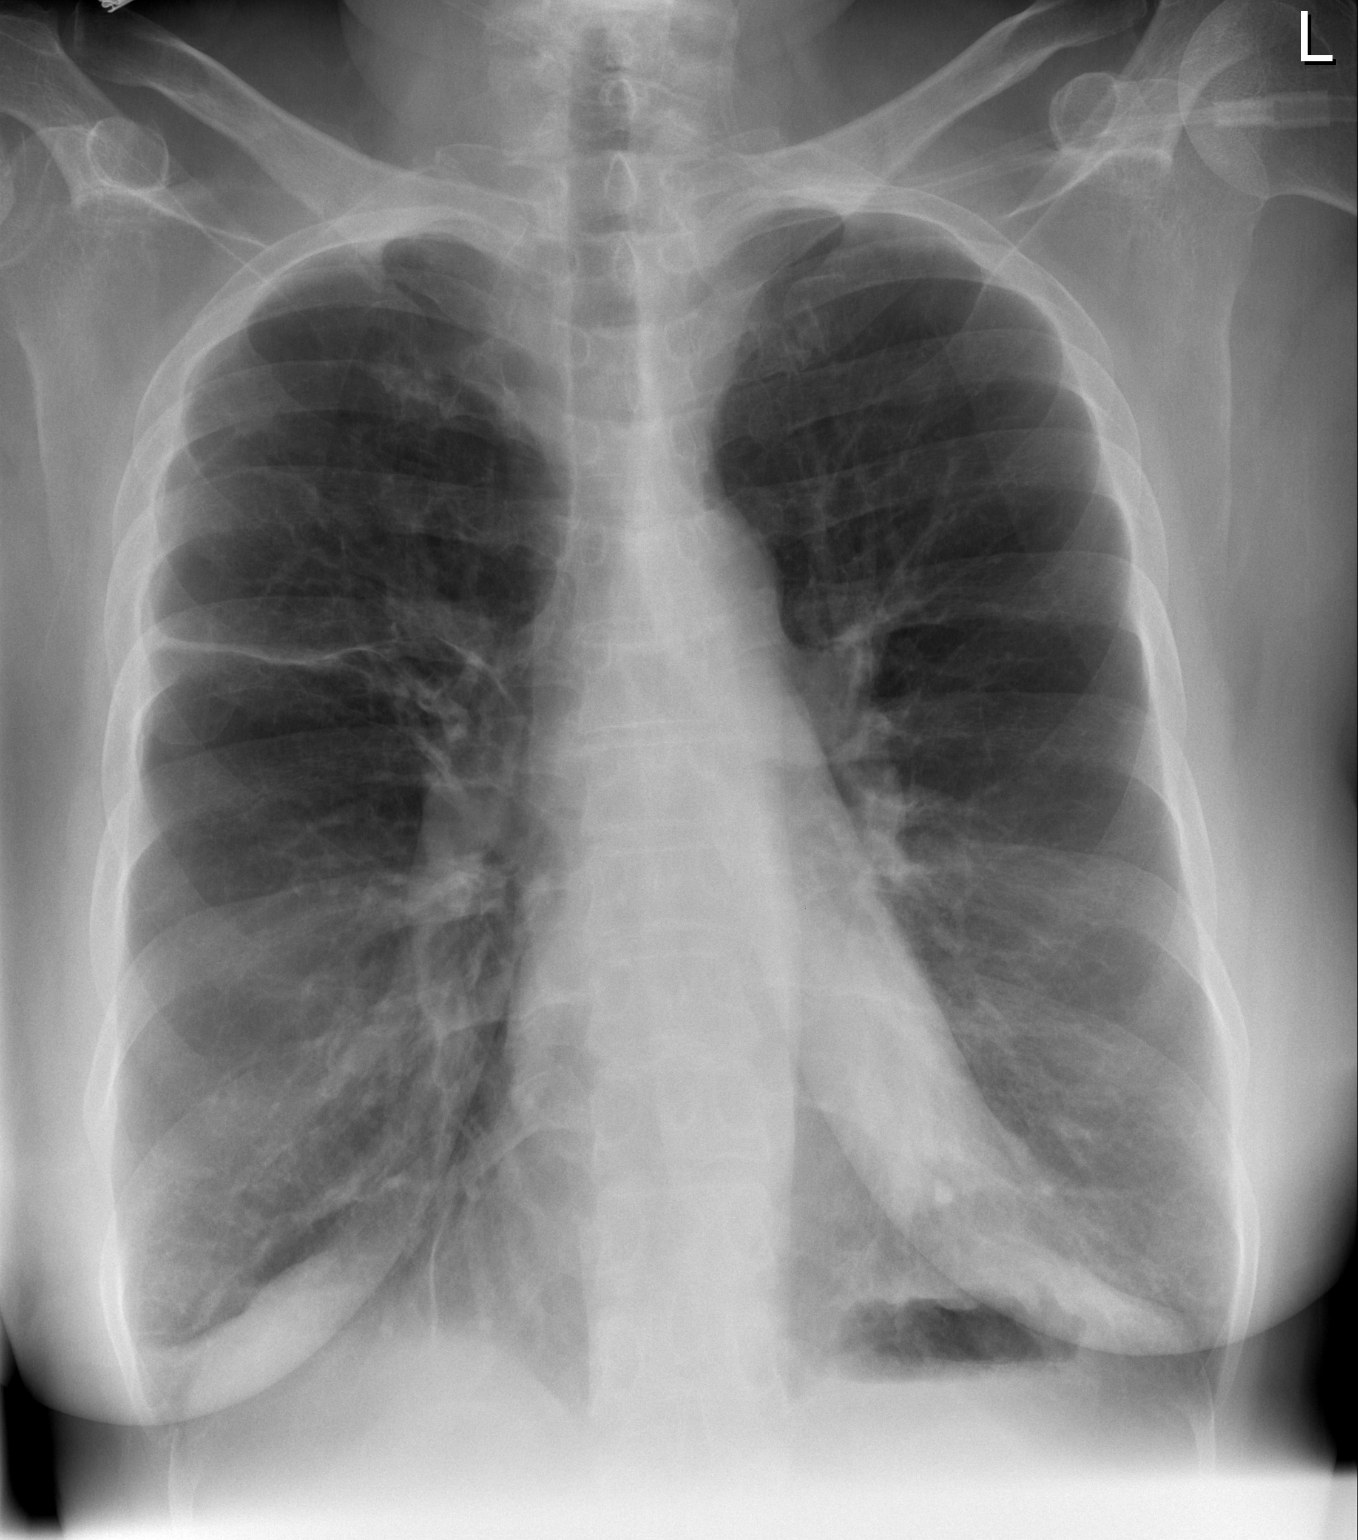

[w chest lat]
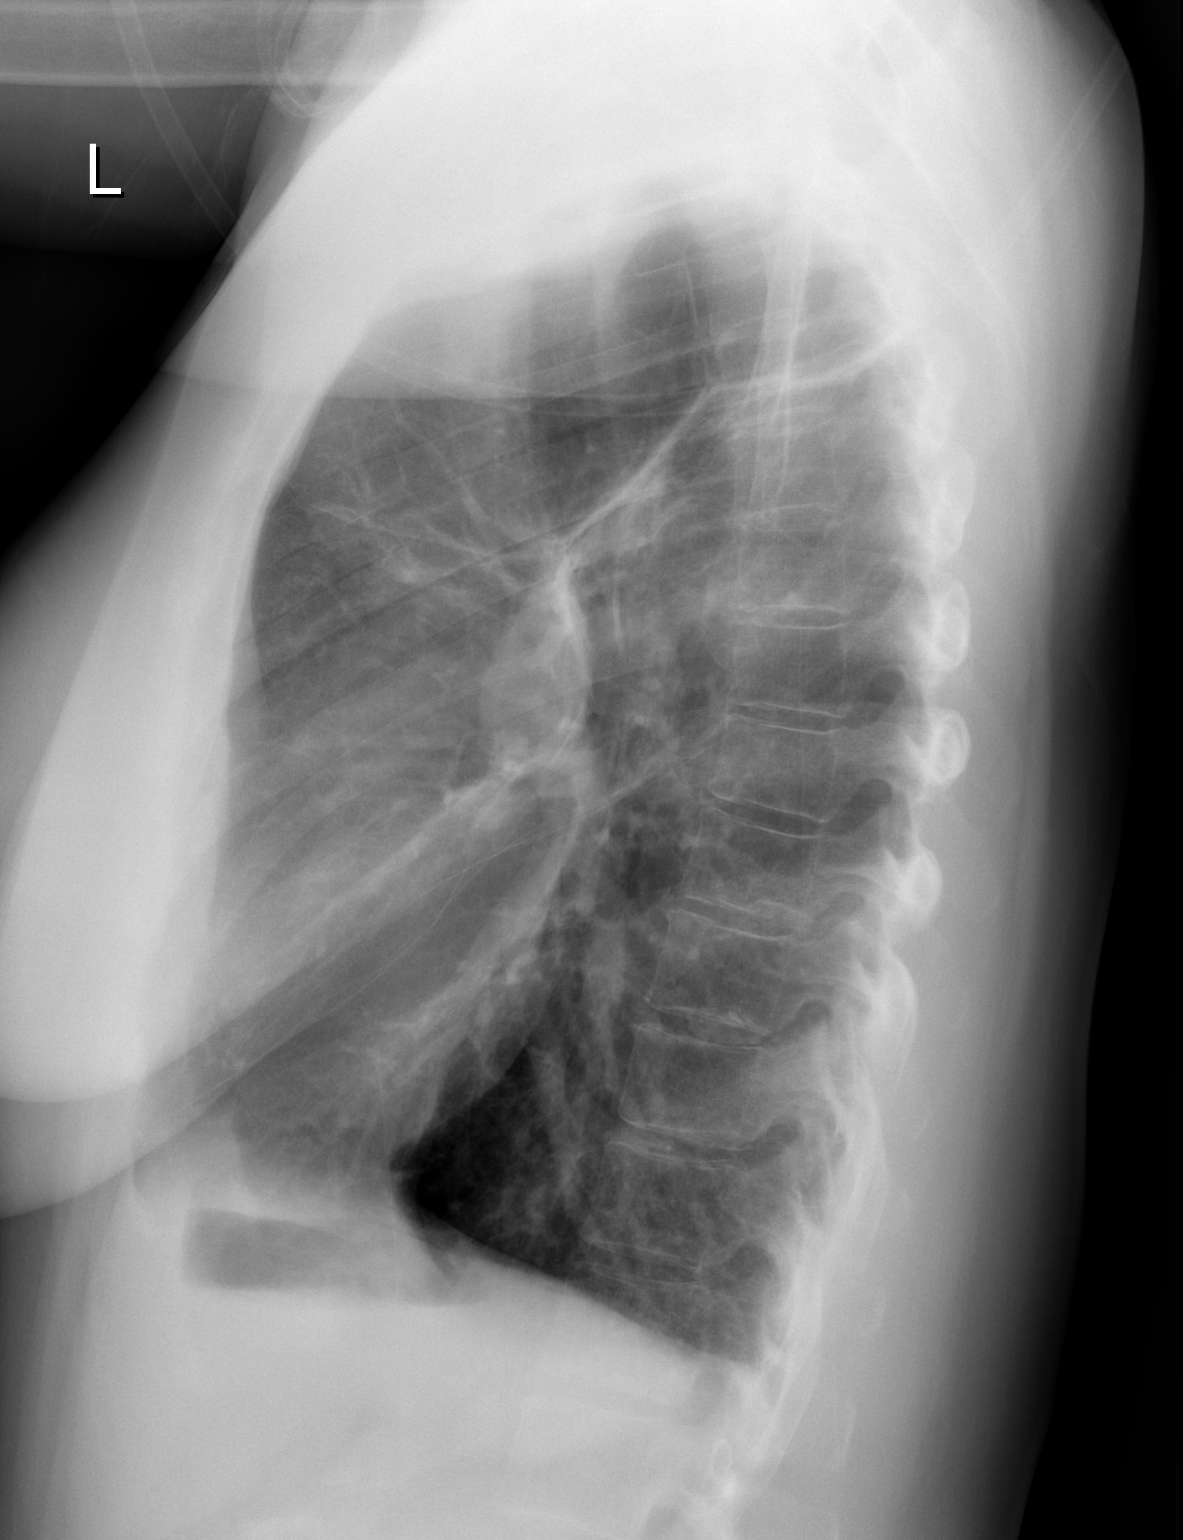

[2 of 2 positions shown; findings below may reference images not displayed]

FINDINGS: Heart size appears normal.

No pleural effusion or edema.

Lungs are hyperinflated and there are coarsened interstitial
markings of COPD.

Stable right upper lobe scar.

There is a peripherally spiculated nodular density within the right
upper lobe.  Not seen on previous exam and indeterminate.
IMPRESSION: 1.  Hyper inflation and coarsened interstitial markings of COPD.
2.  Indeterminate peripherally spiculated nodular opacity in the
right upper lobe.  Advise further evaluation with noncontrast CT of
the chest.
3.  Right upper lobe scarring.

These results will be called to the ordering clinician or
representative by the Radiologist Assistant, and communication
documented in the PACS Dashboard.

## 2013-06-21 ENCOUNTER — Inpatient Hospital Stay (HOSPITAL_COMMUNITY)
Admission: EM | Admit: 2013-06-21 | Discharge: 2013-06-27 | DRG: 388 | Disposition: A | Discharge status: Home Health | Payer: Medicare Other | Attending: Internal Medicine | Admitting: Internal Medicine

## 2013-06-21 ENCOUNTER — Encounter (HOSPITAL_COMMUNITY): Payer: Self-pay | Admitting: Emergency Medicine

## 2013-06-21 ENCOUNTER — Emergency Department (HOSPITAL_COMMUNITY): Payer: Medicare Other

## 2013-06-21 DIAGNOSIS — I10 Essential (primary) hypertension: Secondary | ICD-10-CM | POA: Diagnosis present

## 2013-06-21 DIAGNOSIS — J4489 Other specified chronic obstructive pulmonary disease: Secondary | ICD-10-CM | POA: Diagnosis present

## 2013-06-21 DIAGNOSIS — J439 Emphysema, unspecified: Secondary | ICD-10-CM

## 2013-06-21 DIAGNOSIS — Z79899 Other long term (current) drug therapy: Secondary | ICD-10-CM

## 2013-06-21 DIAGNOSIS — R109 Unspecified abdominal pain: Secondary | ICD-10-CM

## 2013-06-21 DIAGNOSIS — R1110 Vomiting, unspecified: Secondary | ICD-10-CM

## 2013-06-21 DIAGNOSIS — K5010 Crohn's disease of large intestine without complications: Secondary | ICD-10-CM

## 2013-06-21 DIAGNOSIS — D72829 Elevated white blood cell count, unspecified: Secondary | ICD-10-CM | POA: Diagnosis present

## 2013-06-21 DIAGNOSIS — Z87891 Personal history of nicotine dependence: Secondary | ICD-10-CM

## 2013-06-21 DIAGNOSIS — R509 Fever, unspecified: Secondary | ICD-10-CM

## 2013-06-21 DIAGNOSIS — K56609 Unspecified intestinal obstruction, unspecified as to partial versus complete obstruction: Principal | ICD-10-CM

## 2013-06-21 DIAGNOSIS — D649 Anemia, unspecified: Secondary | ICD-10-CM | POA: Diagnosis present

## 2013-06-21 DIAGNOSIS — Z7982 Long term (current) use of aspirin: Secondary | ICD-10-CM

## 2013-06-21 DIAGNOSIS — R627 Adult failure to thrive: Secondary | ICD-10-CM

## 2013-06-21 DIAGNOSIS — F419 Anxiety disorder, unspecified: Secondary | ICD-10-CM

## 2013-06-21 DIAGNOSIS — E43 Unspecified severe protein-calorie malnutrition: Secondary | ICD-10-CM

## 2013-06-21 DIAGNOSIS — E785 Hyperlipidemia, unspecified: Secondary | ICD-10-CM | POA: Diagnosis present

## 2013-06-21 DIAGNOSIS — F3289 Other specified depressive episodes: Secondary | ICD-10-CM

## 2013-06-21 DIAGNOSIS — K4030 Unilateral inguinal hernia, with obstruction, without gangrene, not specified as recurrent: Secondary | ICD-10-CM | POA: Diagnosis present

## 2013-06-21 DIAGNOSIS — K5090 Crohn's disease, unspecified, without complications: Secondary | ICD-10-CM | POA: Diagnosis present

## 2013-06-21 DIAGNOSIS — J438 Other emphysema: Secondary | ICD-10-CM

## 2013-06-21 DIAGNOSIS — E119 Type 2 diabetes mellitus without complications: Secondary | ICD-10-CM

## 2013-06-21 DIAGNOSIS — F329 Major depressive disorder, single episode, unspecified: Secondary | ICD-10-CM

## 2013-06-21 DIAGNOSIS — J9610 Chronic respiratory failure, unspecified whether with hypoxia or hypercapnia: Secondary | ICD-10-CM

## 2013-06-21 DIAGNOSIS — F411 Generalized anxiety disorder: Secondary | ICD-10-CM

## 2013-06-21 DIAGNOSIS — J449 Chronic obstructive pulmonary disease, unspecified: Secondary | ICD-10-CM | POA: Diagnosis present

## 2013-06-21 DIAGNOSIS — E860 Dehydration: Secondary | ICD-10-CM

## 2013-06-21 DIAGNOSIS — Z932 Ileostomy status: Secondary | ICD-10-CM

## 2013-06-21 DIAGNOSIS — M069 Rheumatoid arthritis, unspecified: Secondary | ICD-10-CM | POA: Diagnosis present

## 2013-06-21 DIAGNOSIS — K219 Gastro-esophageal reflux disease without esophagitis: Secondary | ICD-10-CM

## 2013-06-21 DIAGNOSIS — Z9981 Dependence on supplemental oxygen: Secondary | ICD-10-CM

## 2013-06-21 LAB — CBC WITH DIFFERENTIAL/PLATELET
BASOS ABS: 0 10*3/uL (ref 0.0–0.1)
BASOS PCT: 0 % (ref 0–1)
EOS ABS: 1.1 10*3/uL — AB (ref 0.0–0.7)
EOS PCT: 8 % — AB (ref 0–5)
HEMATOCRIT: 32.4 % — AB (ref 36.0–46.0)
HEMOGLOBIN: 10.6 g/dL — AB (ref 12.0–15.0)
Lymphocytes Relative: 14 % (ref 12–46)
Lymphs Abs: 1.9 10*3/uL (ref 0.7–4.0)
MCH: 29 pg (ref 26.0–34.0)
MCHC: 32.7 g/dL (ref 30.0–36.0)
MCV: 88.5 fL (ref 78.0–100.0)
MONO ABS: 0.8 10*3/uL (ref 0.1–1.0)
MONOS PCT: 6 % (ref 3–12)
Neutro Abs: 9.7 10*3/uL — ABNORMAL HIGH (ref 1.7–7.7)
Neutrophils Relative %: 72 % (ref 43–77)
Platelets: 224 10*3/uL (ref 150–400)
RBC: 3.66 MIL/uL — ABNORMAL LOW (ref 3.87–5.11)
RDW: 14.7 % (ref 11.5–15.5)
WBC: 13.5 10*3/uL — ABNORMAL HIGH (ref 4.0–10.5)

## 2013-06-21 LAB — COMPREHENSIVE METABOLIC PANEL
ALBUMIN: 3.1 g/dL — AB (ref 3.5–5.2)
ALK PHOS: 182 U/L — AB (ref 39–117)
ALT: 82 U/L — AB (ref 0–35)
AST: 23 U/L (ref 0–37)
BILIRUBIN TOTAL: 0.2 mg/dL — AB (ref 0.3–1.2)
BUN: 25 mg/dL — ABNORMAL HIGH (ref 6–23)
CHLORIDE: 104 meq/L (ref 96–112)
CO2: 29 mEq/L (ref 19–32)
Calcium: 8.8 mg/dL (ref 8.4–10.5)
Creatinine, Ser: 0.78 mg/dL (ref 0.50–1.10)
GFR calc Af Amer: >90 mL/min (ref >90)
GFR, EST NON AFRICAN AMERICAN: 87 mL/min — AB (ref >90)
Glucose, Bld: 79 mg/dL (ref 70–99)
POTASSIUM: 4.6 meq/L (ref 3.7–5.3)
Sodium: 143 mEq/L (ref 137–147)
Total Protein: 6.8 g/dL (ref 6.0–8.3)

## 2013-06-21 LAB — URINALYSIS, ROUTINE W REFLEX MICROSCOPIC
BILIRUBIN URINE: NEGATIVE
Glucose, UA: NEGATIVE mg/dL
KETONES UR: NEGATIVE mg/dL
NITRITE: NEGATIVE
Protein, ur: NEGATIVE mg/dL
Specific Gravity, Urine: 1.024 (ref 1.005–1.030)
UROBILINOGEN UA: 0.2 mg/dL (ref 0.0–1.0)
pH: 6 (ref 5.0–8.0)

## 2013-06-21 LAB — URINE MICROSCOPIC-ADD ON

## 2013-06-21 LAB — LIPASE, BLOOD: Lipase: 29 U/L (ref 11–59)

## 2013-06-21 LAB — LACTIC ACID, PLASMA: Lactic Acid, Venous: 0.8 mmol/L (ref 0.5–2.2)

## 2013-06-21 MED ORDER — IOHEXOL 300 MG/ML  SOLN
100.0000 mL | Freq: Once | INTRAMUSCULAR | Status: AC | PRN
Start: 2013-06-21 — End: 2013-06-21
  Administered 2013-06-21: 100 mL via INTRAVENOUS

## 2013-06-21 MED ORDER — HYDROMORPHONE HCL PF 1 MG/ML IJ SOLN
1.0000 mg | Freq: Once | INTRAMUSCULAR | Status: AC
  Administered 2013-06-21: 1 mg via INTRAVENOUS
  Filled 2013-06-21: qty 1

## 2013-06-21 MED ORDER — ONDANSETRON HCL 4 MG/2ML IJ SOLN
4.0000 mg | Freq: Once | INTRAMUSCULAR | Status: AC
  Administered 2013-06-21: 4 mg via INTRAVENOUS
  Filled 2013-06-21: qty 2

## 2013-06-21 MED ORDER — SODIUM CHLORIDE 0.9 % IV SOLN
INTRAVENOUS | Status: AC
  Administered 2013-06-21 – 2013-06-22 (×2): via INTRAVENOUS

## 2013-06-21 MED ORDER — SODIUM CHLORIDE 0.9 % IV BOLUS (SEPSIS)
500.0000 mL | Freq: Once | INTRAVENOUS | Status: AC
  Administered 2013-06-21: 500 mL via INTRAVENOUS

## 2013-06-21 MED ORDER — IOHEXOL 300 MG/ML  SOLN
25.0000 mL | INTRAMUSCULAR | Status: AC
  Administered 2013-06-21: 25 mL via ORAL

## 2013-06-21 MED ORDER — HYDROMORPHONE HCL PF 1 MG/ML IJ SOLN
0.5000 mg | INTRAMUSCULAR | Status: AC | PRN
  Administered 2013-06-22 (×2): 0.5 mg via INTRAVENOUS
  Filled 2013-06-21 (×2): qty 1

## 2013-06-21 MED ORDER — ONDANSETRON HCL 4 MG/2ML IJ SOLN: 4.0000 mg | Freq: Three times a day (TID) | INTRAMUSCULAR | Status: AC | PRN

## 2013-06-21 MED ORDER — LORAZEPAM 0.5 MG PO TABS
0.5000 mg | ORAL_TABLET | Freq: Three times a day (TID) | ORAL | Status: DC | PRN
  Administered 2013-06-21 – 2013-06-25 (×4): 0.5 mg via ORAL
  Filled 2013-06-21 (×5): qty 1

## 2013-06-21 NOTE — ED Provider Notes (Signed)
CSN: [id number]     Arrival date & time 06/21/13  1435 History   First MD Initiated Contact with Patient 06/21/13 1505     Chief Complaint  Patient presents with  . Abdominal Pain   (Consider location/radiation/quality/duration/timing/severity/associated sxs/prior Treatment) HPI Comments: 63 yo female with complex medical hx, recent admission for pelvic abscess/ SBO, on TPA, Crohn's, ileostomy, severe COPD, malnutrition, failure to thrive presents with worsening abdominal pain LLQ, similar to previous admission but not as severe.  Started this am, fairly constant, sharp, non radiating, also mild RUQ pain.  No blood in pouch.  No fevers.  No current abx.  Pt does have PICC line.  Last CT scan in Dec.   Patient is a 63 y.o. female presenting with abdominal pain. The history is provided by the patient.  Abdominal Pain Pain location:  Generalized Associated symptoms: fatigue, nausea and vomiting   Associated symptoms: no chest pain, no chills, no dysuria, no fever and no shortness of breath     Past Medical History  Diagnosis Date  . Other diseases of vocal cords   . Osteoporosis, unspecified   . Other and unspecified hyperlipidemia   . Esophageal reflux   . Chronic airway obstruction, not elsewhere classified   . Regional enteritis of unspecified site   . 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 Crohn's disease     Ileostomy  . Pelvic adhesions   . Anxiety   . Depression   . Hypertension   . Emphysema   . Hematuria   . Severe cervical dysplasia age 67    cone biopsy with neg paps since  . Pneumonia     "3-4 times; necrotizing once" (04/19/2013)  . Asthmatic bronchitis , chronic   . Exertional shortness of breath   . On home oxygen therapy     "24/7; 2.5L @ rest; 3L when I'm doing my chores" (04/19/2013)  . Type II diabetes mellitus   . Anemia   . History of blood transfusion     "I've had 3 or 4" (04/19/2013)  . Rheumatoid arthritis(714.0) 03/2012     [place]; "mild in my hands" (04/19/2013)  . Small bowel obstruction due to adhesions 2014    [place]  . Pelvic abscess in female 2014   Past Surgical History  Procedure Laterality Date  . Ileostomy  2007  . Ear cyst excision Bilateral 1970's    "lobes"   . Cervical cone biopsy  1978  . Colon surgery  2007    subtotal colectomy with ileostomy  . Esophagogastroduodenoscopy (egd) with esophageal dilation      "3 times, I think" (04/19/2013)  . Oophorectomy      LSO  . Tracheostomy  2010  . Hammer toe surgery Bilateral 1980's  . Balloon dilation  02/10/2012    Procedure: BALLOON DILATION;  Surgeon: Charolett Bumpers, MD;  Location: [facility];  Service: Endoscopy;  Laterality: N/A;  . Colposcopy    . Flexible sigmoidoscopy  03/26/2012    Procedure: FLEXIBLE SIGMOIDOSCOPY;  Surgeon: Charolett Bumpers, MD;  Location: [facility];  Service: Endoscopy;  Laterality: N/A;  . Cardiac catheterization  07/08/2007    normal  . Nm myocar perf wall motion  09/21/2006    no ischemia  . Ventral hernia repair N/A 03/16/2013    Procedure: REPAIR OF INCISIONAL AND PARASTOMAL HERNIAS;  Surgeon: Mariella Saa, MD;  Location: [facility] OR;  Service: General;  Laterality: N/A;  . Laparotomy N/A 03/16/2013    Procedure:  EXPLORATORY LAPAROTOMY;  Surgeon: Mariella Saa, MD;  Location: [facility] OR;  Service: General;  Laterality: N/A;  . Lysis of adhesion N/A 03/16/2013    Procedure: LYSIS OF ADHESIONS FOR SMALL BOWEL OBSTRUCTION;  Surgeon: Mariella Saa, MD;  Location: [facility] OR;  Service: General;  Laterality: N/A;  . Insertion of mesh N/A 03/16/2013    Procedure: INSERTION OF BIOLOGIC MESH;  Surgeon: Mariella Saa, MD;  Location: [facility] OR;  Service: General;  Laterality: N/A;  . Tee without cardioversion N/A 03/21/2013    Procedure: TRANSESOPHAGEAL ECHOCARDIOGRAM (TEE);  Surgeon: Vesta Mixer, MD;  Location: [facility] ENDOSCOPY;  Service: Cardiovascular;  Laterality: N/A;  . Vaginal hysterectomy  1993     [facility]VH-LSO  . Tracheostomy closure  2010   Family History  Problem Relation Age of Onset  . Osteoporosis Mother   . Kidney disease Mother   . Hypertension Sister   . Stroke Sister   . Diabetes Sister    History  Substance Use Topics  . Smoking status: Former Smoker -- 1.00 packs/day for 32 years    Types: Cigarettes    Quit date: 05/26/2004  . Smokeless tobacco: Never Used  . Alcohol Use: 0.0 oz/week     Comment: 04/19/2013 "have a drink a couple times/yr"   OB History   Grav Para Term Preterm Abortions TAB SAB Ect Mult Living   3 0   3     0     Review of Systems  Constitutional: Positive for appetite change and fatigue. Negative for fever and chills.  HENT: Negative for congestion.   Eyes: Negative for visual disturbance.  Respiratory: Negative for shortness of breath.   Cardiovascular: Negative for chest pain.  Gastrointestinal: Positive for nausea, vomiting and abdominal pain. Negative for blood in stool.  Genitourinary: Negative for dysuria and flank pain.  Musculoskeletal: Positive for neck pain. Negative for back pain and neck stiffness.  Skin: Negative for rash.  Neurological: Negative for light-headedness and headaches.    Allergies  Esomeprazole magnesium; Shellfish allergy; Surgical lubricant; Other; Peanut-containing drug products; and Levaquin  Home Medications   Current Outpatient Rx  Name  Route  Sig  Dispense  Refill  . Amino Acids-Protein Hydrolys (FEEDING SUPPLEMENT, PRO-STAT SUGAR FREE 64,) LIQD   Oral   Take 30 mLs by mouth daily.   900 mL   0   . arformoterol (BROVANA) 15 MCG/2ML NEBU   Nebulization   Take 15 mcg by nebulization 2 (two) times daily.         [name] aspirin 81 MG tablet   Oral   Take 81 mg by mouth every Monday, Wednesday, and Friday.          . budesonide (PULMICORT) 0.25 MG/2ML nebulizer solution   Nebulization   Take 0.25 mg by nebulization 2 (two) times daily.         . celecoxib (CELEBREX) 200 MG capsule   Oral    Take 200 mg by mouth daily.         . Cholecalciferol (VITAMIN D) 1000 UNITS capsule   Oral   Take 1,000 Units by mouth daily.          . citalopram (CELEXA) 20 MG tablet   Oral   Take 20 mg by mouth daily.          [name] diltiazem (CARDIZEM CD) 180 MG 24 hr capsule      TAKE 1 CAPSULE (180 MG TOTAL) BY MOUTH 2 (TWO) TIMES DAILY.  60 capsule   8   . famotidine (PEPCID) 20 MG tablet   Oral   Take 20 mg by mouth 2 (two) times daily.         . fluticasone (FLONASE) 50 MCG/ACT nasal spray   Nasal   Place 2 sprays into the nose daily as needed.          . furosemide (LASIX) 20 MG tablet   Oral   Take 20 mg by mouth 2 (two) times daily. Once or twice daily as needed for swelling         . HYDROcodone-acetaminophen (NORCO/VICODIN) 5-325 MG per tablet   Oral   Take 1-2 tablets by mouth every 6 (six) hours as needed for moderate pain or severe pain.   40 tablet   0   . hydroxychloroquine (PLAQUENIL) 200 MG tablet   Oral   Take 200 mg by mouth Twice daily.          . IRON PO   Oral   Take 1 tablet by mouth every Monday, Wednesday, and Friday.          . Liraglutide (VICTOZA) 18 MG/3ML SOLN   Subcutaneous   Inject 1.8 mg into the skin daily. 1.8 units once daily         . LORazepam (ATIVAN) 0.5 MG tablet      TAKE 1 TABLET EVERY 8 HOURS AS NEEDED FOR ANXIETY   60 tablet   0   . metoprolol tartrate (LOPRESSOR) 25 MG tablet      ON HOLD         . omeprazole (PRILOSEC) 40 MG capsule   Oral   Take 40 mg by mouth 2 (two) times daily.          . ondansetron (ZOFRAN) 4 MG tablet   Oral   Take 1 tablet (4 mg total) by mouth every 6 (six) hours as needed for nausea.   20 tablet   0   . predniSONE (DELTASONE) 10 MG tablet      4 x 3 days, 3 x 3 days, 2 x 3 days, 1 x 3 days then stop   30 tablet   0   . PROAIR HFA 108 (90 BASE) MCG/ACT inhaler   Inhalation   Inhale 2 puffs into the lungs every 6 (six) hours as needed.         [name]. Respiratory  Therapy Supplies (FLUTTER) DEVI   Inhalation   Inhale into the lungs. 2-4 times daily         . rosuvastatin (CRESTOR) 5 MG tablet   Oral   Take 5 mg by mouth every Monday, Wednesday, and Friday.          . tiotropium (SPIRIVA) 18 MCG inhalation capsule   Inhalation   Place 18 mcg into inhaler and inhale daily.          BP 99/63  Pulse 117  Temp(Src) 98.4 F (36.9 C) (Oral)  Resp 12  SpO2 100% Physical Exam  Nursing note and vitals reviewed. Constitutional: She is oriented to person, place, and time. She appears well-developed and well-nourished.  HENT:  Head: Normocephalic and atraumatic.  Mild dry mm  Eyes: Conjunctivae are normal. Right eye exhibits no discharge. Left eye exhibits no discharge.  Neck: Normal range of motion. Neck supple. No tracheal deviation present.  Cardiovascular: Regular rhythm.  Tachycardia present.   Pulmonary/Chest: Effort normal and breath sounds normal.  Abdominal: Soft. She exhibits no distension. There is  tenderness (worse LLQ, mild central and RUQ, pouch inplace RLQ). There is no guarding.  Musculoskeletal: She exhibits no edema.  Neurological: She is alert and oriented to person, place, and time.  Skin: Skin is warm. No rash noted.  Psychiatric: She has a normal mood and affect.    ED Course  Procedures (including critical care time) Labs Review Labs Reviewed  COMPREHENSIVE METABOLIC PANEL - Abnormal; Notable for the following:    BUN 25 (*)    Albumin 3.1 (*)    ALT 82 (*)    Alkaline Phosphatase 182 (*)    Total Bilirubin 0.2 (*)    GFR calc non Af Amer 87 (*)    All other components within normal limits  CBC WITH DIFFERENTIAL - Abnormal; Notable for the following:    WBC 13.5 (*)    RBC 3.66 (*)    Hemoglobin 10.6 (*)    HCT 32.4 (*)    Neutro Abs 9.7 (*)    Eosinophils Relative 8 (*)    Eosinophils Absolute 1.1 (*)    All other components within normal limits  URINALYSIS, ROUTINE W REFLEX MICROSCOPIC - Abnormal;  Notable for the following:    APPearance CLOUDY (*)    Hgb urine dipstick MODERATE (*)    Leukocytes, UA SMALL (*)    All other components within normal limits  URINE MICROSCOPIC-ADD ON - Abnormal; Notable for the following:    Squamous Epithelial / LPF FEW (*)    Casts HYALINE CASTS (*)    All other components within normal limits  CBC - Abnormal; Notable for the following:    WBC 11.8 (*)    RBC 3.30 (*)    Hemoglobin 9.4 (*)    HCT 30.0 (*)    All other components within normal limits  BASIC METABOLIC PANEL - Abnormal; Notable for the following:    Calcium 8.3 (*)    GFR calc non Af Amer 62 (*)    GFR calc Af Amer 72 (*)    All other components within normal limits  URINE CULTURE  CULTURE, BLOOD (ROUTINE X 2)  CULTURE, BLOOD (ROUTINE X 2)  URINE CULTURE  LIPASE, BLOOD  LACTIC ACID, PLASMA  MAGNESIUM  PHOSPHORUS  URINALYSIS, ROUTINE W REFLEX MICROSCOPIC   Imaging Review Abd 1 View (kub)  06/22/2013   CLINICAL DATA:  Followup small bowel obstruction  EXAM: ABDOMEN - 1 VIEW  COMPARISON:  CT abdomen and pelvis 06/21/2013  FINDINGS: Right lower quadrant ostomy.  Excreted contrast material within bladder and minimally in right renal collecting system.  Single dilated loop of small bowel in the left pelvis consistent with small bowel obstruction.  No definite bowel wall thickening seen.  Endovascular coils noted right paraspinal at L1-L2.  Osseous structures unremarkable.  IMPRESSION: Persistent dilatation of a small bowel loop in the left pelvis consistent with obstruction.   Electronically Signed   By: Ulyses Southward M.D.   On: 06/22/2013 08:30   Ct Abdomen Pelvis W Contrast  06/21/2013   CLINICAL DATA:  Upper abdominal pain.  EXAM: CT ABDOMEN AND PELVIS WITH CONTRAST  TECHNIQUE: Multidetector CT imaging of the abdomen and pelvis was performed using the standard protocol following bolus administration of intravenous contrast.  CONTRAST:  OMNIPAQUE IOHEXOL 300 MG/ML  SOLN   COMPARISON:  CT of the abdomen and pelvis 05/06/2013.  FINDINGS: Lung Bases: Unremarkable.  Abdomen/Pelvis: The appearance of the liver, gallbladder, pancreas, spleen, bilateral adrenal glands and right kidney is unremarkable. Several sub cm  low-attenuation lesions are noted in the left kidney, similar to the prior study; although too small to definitively characterize, these are favored to represent tiny cysts. High density material posterior to the pre-pyloric gastric antrum, presumably embolization coils.  Postoperative changes of total colectomy with right lower quadrant ileostomy and Hartmann's pouch are again noted. There are several areas acute angulation of small bowel, best demonstrated on image 55 of series 2 adjacent to the dilated loop, where there is an acutely angulated loop of bowel, presumably related to underlying adhesions. There is a small amount a haziness in the small bowel mesentery. Trace volume of ascites. No pneumoperitoneum. No definite lymphadenopathy identified within the abdomen or pelvis. Status post hysterectomy. Previously noted lesion associated with the vaginal cuff is less cystic in appearance and smaller, currently measuring 2.7 x 1.6 cm (image 60 of series 2), presumably some involuting postoperative scar tissue. This does appear intimately associated with multiple adjacent loops of small bowel, suggesting the presence of adhesions adjacent to the vaginal cuff. Urinary bladder is unremarkable in appearance.  Musculoskeletal: There are no aggressive appearing lytic or blastic lesions noted in the visualized portions of the skeleton.  IMPRESSION: 1. 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 There is fecalization of small bowel contents in this region. Despite the presence of dilated loops of small bowel, the more proximal portion of the small bowel  does not appear dilated, suggesting against frank obstruction at this time. 2. Small amount of edema in the small bowel mesentery and trace volume of ascites may suggest some active inflammation in this patient with history of Crohn's disease. 3. No pneumoperitoneum. 4. Additional incidental findings, as above, similar prior studies.   Electronically Signed   By: Trudie Reed M.D.   On: 06/21/2013 19:32    EKG Interpretation   None       MDM   1. Severe protein-calorie malnutrition   2. Abdominal pain   3. Crohn's colitis   4. Vomiting   5. Dehydration   6. Anxiety disorder   7. Chronic respiratory failure   8. COPD with emphysema   9. Depressive disorder, not elsewhere classified   10. Failure to thrive in adult   53. SBO (small bowel obstruction)   12. Fever, unspecified   13. Type II or unspecified type diabetes mellitus without mention of complication, not stated as uncontrolled    Complex medical and surgery hx.   Clinically concern for SBO vs abscess (hx of two in similar location) vs Crohn's/ colitis vs other. Labs/ lactate, pain meds, fluids, zofran.  Pt improved on rechecks, HR improved.   CT delayed due to trauma arrivals.  Vomiting improved.   CT reviewed, no acute findings, mild thickening mild small bowel dilation.  No acute obstruction or abscess. Discussed observation with pt and triad.   The patients results and plan were reviewed and discussed.   Any x-rays performed were personally reviewed by myself.   Differential diagnosis were considered with the presenting HPI.   Admission/ observation were discussed with the admitting physician, patient and/or family and they are comfortable with the plan.       Enid Skeens, MD 06/22/13 [phone]

## 2013-06-21 NOTE — ED Notes (Signed)
Emptied pt's colostomy bag.  

## 2013-06-21 NOTE — ED Notes (Signed)
Pt arrived by [facility] with c/o upper abd pain. Pt has hx of abd abscess and took vicodin at home with no relief. Vomit x 2.  Pt has PICC line to right arm. BP-136/66 HR-116 Resp-18 O2sat-100 2lpm. Pt normally on home O2 2lpm

## 2013-06-21 NOTE — H&P (Signed)
[facility]                                                                                    Patient Demographics  Naria Abbey, is a 63 y.o. female  CSN: [id number]  MRN: [id number]  DOB - [birth year]  Admit Date - 06/21/2013  Outpatient Primary MD for the patient is [name], MD   With History of -  Past Medical History  Diagnosis Date  . Other diseases of vocal cords   . Osteoporosis, unspecified   . Other and unspecified hyperlipidemia   . Esophageal reflux   . Chronic airway obstruction, not elsewhere classified   . Regional enteritis of unspecified site   . 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 Crohn's disease     Ileostomy  . Pelvic adhesions   . Anxiety   . Depression   . Hypertension   . Emphysema   . Hematuria   . Severe cervical dysplasia age 57    cone biopsy with neg paps since  . Pneumonia     "3-4 times; necrotizing once" (04/19/2013)  . Asthmatic bronchitis , chronic   . Exertional shortness of breath   . On home oxygen therapy     "24/7; 2.5L @ rest; 3L when I'm doing my chores" (04/19/2013)  . Type II diabetes mellitus   . Anemia   . History of blood transfusion     "I've had 3 or 4" (04/19/2013)  . Rheumatoid arthritis(714.0) 03/2012    [place]; "mild in my hands" (04/19/2013)  . Small bowel obstruction due to adhesions 2014    [place]  . Pelvic abscess in female 2014      Past Surgical History  Procedure Laterality Date  . Ileostomy  2007  . Ear cyst excision Bilateral 1970's    "lobes"   . Cervical cone biopsy  1978  . Colon surgery  2007    subtotal colectomy with ileostomy  . Esophagogastroduodenoscopy (egd) with esophageal dilation      "3 times, I think" (04/19/2013)  . Oophorectomy      LSO  . Tracheostomy  2010  . Hammer toe surgery Bilateral 1980's  . Balloon dilation  02/10/2012    Procedure: BALLOON DILATION;  Surgeon: Charolett Bumpers, MD;  Location: [facility] ENDOSCOPY;   Service: Endoscopy;  Laterality: N/A;  . Colposcopy    . Flexible sigmoidoscopy  03/26/2012    Procedure: FLEXIBLE SIGMOIDOSCOPY;  Surgeon: Charolett Bumpers, MD;  Location: [facility]OSCOPY;  Service: Endoscopy;  Laterality: N/A;  . Cardiac catheterization  07/08/2007    normal  . Nm myocar perf wall motion  09/21/2006    no ischemia  . Ventral hernia repair N/A 03/16/2013    Procedure: REPAIR OF INCISIONAL AND PARASTOMAL HERNIAS;  Surgeon: Mariella Saa, MD;  Location: [facility] OR;  Service: General;  Laterality: N/A;  . Laparotomy N/A 03/16/2013    Procedure: EXPLORATORY LAPAROTOMY;  Surgeon: Mariella Saa, MD;  Location: [facility] OR;  Service: General;  Laterality: N/A;  . Lysis of adhesion N/A 03/16/2013    Procedure: LYSIS OF ADHESIONS FOR SMALL BOWEL OBSTRUCTION;  Surgeon: Mariella Saa, MD;  Location: [facility] OR;  Service: General;  Laterality: N/A;  . Insertion of mesh N/A 03/16/2013    Procedure: INSERTION OF BIOLOGIC MESH;  Surgeon: Mariella Saa, MD;  Location: [facility] OR;  Service: General;  Laterality: N/A;  . Tee without cardioversion N/A 03/21/2013    Procedure: TRANSESOPHAGEAL ECHOCARDIOGRAM (TEE);  Surgeon: Vesta Mixer, MD;  Location: [facility] ENDOSCOPY;  Service: Cardiovascular;  Laterality: N/A;  . Vaginal hysterectomy  1993    LAVH-LSO  . Tracheostomy closure  2010    in for   Chief Complaint  Patient presents with  . Abdominal Pain     HPI  Lilyana Lippman  is a 63 y.o. female, with past medical history significant for Crohn's disease status post ileostomy, multiple abscesses and small bowel obstructions in the past and multiple surgeries for adhesional lysis and small bowel obstruction in addition to subtotal colectomy presenting with abdominal pain of one-day duration with nausea and vomiting. CT of the abdomen was not conclusive for small bowel obstruction but bowel distention was noted. Patient denies any history of trauma to the abdomen. Patient is on TPN for help  for her nutrition and she usually tolerates soft diet. Her ileostomy is still draining.    Review of Systems    In addition to the HPI above,  No Fever-chills, No Headache, No changes with Vision or hearing, No problems swallowing food or Liquids, No Chest pain, Cough or Shortness of Breath, No Blood in stool or Urine, No dysuria, No new skin rashes or bruises, No new joints pains-aches,  No new weakness, tingling, numbness in any extremity, No recent weight gain or loss, No polyuria, polydypsia or polyphagia, No significant Mental Stressors.  A full 10 point Review of Systems was done, except as stated above, all other Review of Systems were negative.   Social History History  Substance Use Topics  . Smoking status: Former Smoker -- 1.00 packs/day for 32 years    Types: Cigarettes    Quit date: 05/26/2004  . Smokeless tobacco: Never Used  . Alcohol Use: 0.0 oz/week     Comment: 04/19/2013 "have a drink a couple times/yr"     Family History Family History  Problem Relation Age of Onset  . Osteoporosis Mother   . Kidney disease Mother   . Hypertension Sister   . Stroke Sister   . Diabetes Sister      Prior to Admission medications   Medication Sig Start Date End Date Taking? Authorizing Provider  Amino Acids-Protein Hydrolys (FEEDING SUPPLEMENT, PRO-STAT SUGAR FREE 64,) LIQD Take 30 mLs by mouth daily. 05/11/13  Yes Megan [name], PA-C  arformoterol (BROVANA) 15 MCG/2ML NEBU Take 15 mcg by nebulization 2 (two) times daily.   Yes Historical Provider, MD  aspirin 81 MG tablet Take 81 mg by mouth every Monday, Wednesday, and Friday.    Yes Historical Provider, MD  budesonide (PULMICORT) 0.25 MG/2ML nebulizer solution Take 0.25 mg by nebulization 2 (two) times daily.   Yes Historical Provider, MD  calcium-vitamin D (OSCAL WITH D) 500-200 MG-UNIT per tablet Take 1 tablet by mouth 2 (two) times daily.   Yes Historical Provider, MD  celecoxib (CELEBREX) 200 MG capsule Take  200 mg by mouth daily.   Yes Historical Provider, MD  Cholecalciferol (VITAMIN D) 1000 UNITS capsule Take 1,000 Units by mouth daily.    Yes Historical Provider, MD  citalopram (CELEXA) 20 MG tablet Take 20 mg by mouth daily.    Yes Historical Provider, MD  diltiazem (  TIAZAC) 180 MG 24 hr capsule Take 180 mg by mouth 2 (two) times daily.   Yes Historical Provider, MD  famotidine (PEPCID) 20 MG tablet Take 20 mg by mouth 2 (two) times daily.   Yes Historical Provider, MD  fluticasone (FLONASE) 50 MCG/ACT nasal spray Place 2 sprays into the nose daily as needed.    Yes Historical Provider, MD  furosemide (LASIX) 20 MG tablet Take 20 mg by mouth 2 (two) times daily. Once or twice daily as needed for swelling   Yes Historical Provider, MD  guaiFENesin (MUCINEX) 600 MG 12 hr tablet Take 1,200 mg by mouth 2 (two) times daily.   Yes Historical Provider, MD  HYDROcodone-acetaminophen (NORCO/VICODIN) 5-325 MG per tablet Take 1-2 tablets by mouth every 6 (six) hours as needed for moderate pain or severe pain. 05/11/13  Yes Megan [name], PA-C  hydroxychloroquine (PLAQUENIL) 200 MG tablet Take 200 mg by mouth Twice daily.  04/20/12  Yes Historical Provider, MD  IRON PO Take 1 tablet by mouth every Monday, Wednesday, and Friday.    Yes Historical Provider, MD  Liraglutide (VICTOZA) 18 MG/3ML SOLN Inject 1.8 mg into the skin daily. 1.8 units once daily   Yes Historical Provider, MD  LORazepam (ATIVAN) 0.5 MG tablet Take 0.5 mg by mouth every 8 (eight) hours as needed for anxiety.   Yes Historical Provider, MD  omeprazole (PRILOSEC) 40 MG capsule Take 40 mg by mouth 2 (two) times daily.    Yes Historical Provider, MD  ondansetron (ZOFRAN) 4 MG tablet Take 1 tablet (4 mg total) by mouth every 6 (six) hours as needed for nausea. 04/29/13  Yes Leroy Sea, MD  PROAIR HFA 108 (90 BASE) MCG/ACT inhaler Inhale 2 puffs into the lungs every 6 (six) hours as needed for wheezing or shortness of breath.    Yes Historical  Provider, MD  rosuvastatin (CRESTOR) 5 MG tablet Take 5 mg by mouth every Monday, Wednesday, and Friday.    Yes Historical Provider, MD  tiotropium (SPIRIVA) 18 MCG inhalation capsule Place 18 mcg into inhaler and inhale daily.   Yes Historical Provider, MD  TPN ADULT Inject into the vein continuous. On at 9am off at 9pm   144.5ml/hr  ( over 12 hours)   Yes Historical Provider, MD  zoledronic acid (RECLAST) 5 MG/100ML SOLN injection Inject 5 mg into the vein once. Once a year in May   Yes Historical Provider, MD  Respiratory Therapy Supplies (FLUTTER) DEVI Inhale into the lungs. 2-4 times daily    Historical Provider, MD    Allergies  Allergen Reactions  . Esomeprazole Magnesium Other (See Comments)    NEXIUM - reaction > aggravated pt's Crohn's disease  . Shellfish Allergy Shortness Of Breath and Nausea And Vomiting  . Surgical Lubricant Other (See Comments)    Burns skin   . Other Nausea And Vomiting    Beans, Dander/Dust, Peas, Mushrooms  . Peanut-Containing Drug Products Nausea And Vomiting  . Levaquin [Levofloxacin]     Physical Exam  Vitals  Blood pressure 101/49, pulse 107, temperature 98.4 F (36.9 C), temperature source Oral, resp. rate 14, SpO2 100.00%.   1. General African American female  2. Normal affect and insight, Not Suicidal or Homicidal, Awake Alert, Oriented X 3.  3. No F.N deficits, ALL C.Nerves Intact, Strength 5/5 all 4 extremities, Sensation intact all 4 extremities, Plantars down going.  4. Ears and Eyes appear Normal, Conjunctivae clear, PERRLA. Moist Oral Mucosa.  5. Supple Neck, No JVD,  No cervical lymphadenopathy appriciated, No Carotid Bruits.  6. Symmetrical Chest wall movement, Good air movement bilaterally, CTAB.  7. RRR, No Gallops, Rubs or Murmurs, No Parasternal Heave.  8. decreased bowel sounds, ileostomy noted, draining, no significant tenderness appreciated  9.  No Cyanosis, Normal Skin Turgor, No Skin Rash or Bruise.  10.  Good muscle tone,  joints appear normal , no effusions, Normal ROM.  11. No Palpable Lymph Nodes in Neck or Axillae    Data Review  CBC  Recent Labs Lab 06/21/13 1522  WBC 13.5*  HGB 10.6*  HCT 32.4*  PLT 224  MCV 88.5  MCH 29.0  MCHC 32.7  RDW 14.7  LYMPHSABS 1.9  MONOABS 0.8  EOSABS 1.1*  BASOSABS 0.0   ------------------------------------------------------------------------------------------------------------------  Chemistries   Recent Labs Lab 06/21/13 1522  NA 143  K 4.6  CL 104  CO2 29  GLUCOSE 79  BUN 25*  CREATININE 0.78  CALCIUM 8.8  AST 23  ALT 82*  ALKPHOS 182*  BILITOT 0.2*   Cardiac Enzymes No results found for this basename: CK, CKMB, TROPONINI, MYOGLOBIN,  in the last 168 hours ------------------------------------------------------------------------------------------------------------------ No components found with this basename: POCBNP,    ---------------------------------------------------------------------------------------------------------------  Urinalysis    Component Value Date/Time   COLORURINE YELLOW 06/21/2013 1708   APPEARANCEUR CLOUDY* 06/21/2013 1708   LABSPEC 1.024 06/21/2013 1708   PHURINE 6.0 06/21/2013 1708   GLUCOSEU NEGATIVE 06/21/2013 1708   HGBUR MODERATE* 06/21/2013 1708   BILIRUBINUR NEGATIVE 06/21/2013 1708   KETONESUR NEGATIVE 06/21/2013 1708   PROTEINUR NEGATIVE 06/21/2013 1708   UROBILINOGEN 0.2 06/21/2013 1708   NITRITE NEGATIVE 06/21/2013 1708   LEUKOCYTESUR SMALL* 06/21/2013 1708    ----------------------------------------------------------------------------------------------------------------    Imaging results:   Ct Abdomen Pelvis W Contrast  06/21/2013   CLINICAL DATA:  Upper abdominal pain.  EXAM: CT ABDOMEN AND PELVIS WITH CONTRAST  TECHNIQUE: Multidetector CT imaging of the abdomen and pelvis was performed using the standard protocol following bolus administration of intravenoLoLodLode<MEASUREMEHayward ArLodema PilotSanes HospitalMPiCuTelecare [facility] [id number] HidDSebasti : Lung Bases: Unremarkable.  Abdomen/Pelvis: The appearance of the liver, gallbladder, pancreas, spleen, bilateral adrenal glands and right kidney is unremarkable. Several sub cm low-attenuation lesions are noted in the left kidney, similar to the prior study; although too small to definitively characterize, these are favored to represent tiny cysts. High density material posterior to the pre-pyloric gastric antrum, presumably embolization coils.  Postoperative changes of total colectomy with right lower quadrant ileostomy and Hartmann's pouch are again noted. There are several areas acute angulation of small bowel, best demonstrated on image 55 of series 2 adjacent to the dilated loop, where there is an acutely angulated loop of bowel, presumably related to underlying adhesions. There is a small amount a haziness in the small bowel mesentery. Trace volume of ascites. No pneumoperitoneum. No definite lymphadenopathy identified within the abdomen or pelvis. Status post hysterectomy. Previously noted lesion associated with the vaginal cuff is less cystic in appearance and smaller, currently measuring 2.7 x 1.6 cm (image 60 of series 2), presumably some involuting postoperative scar tissue. This does appear intimately associated with multiple adjacent loops of small bowel, suggesting the presence of adhesions adjacent to the vaginal cuff. Urinary bladder is unremarkable in appearance.  Musculoskeletal: There are no aggressive appearing lytic or blastic lesions noted in the visualized portions of the skeleton.  IMPRESSION: 1. Status post total colectomy with right  lower quadrant ileostomy and Hartmann's pouch, with recurrent dilatation of mid to distal small bowel, presumably related to adhesions. There is fecalization of small bowel contents in this region. Despite the presence of dilated loops of small bowel, the  more proximal portion of the small bowel does not appear dilated, suggesting against frank obstruction at this time. 2. Small amount of edema in the small bowel mesentery and trace volume of ascites may suggest some active inflammation in this patient with history of Crohn's disease. 3. No pneumoperitoneum. 4. Additional incidental findings, as above, similar prior studies.   Electronically Signed   By: Trudie Reed M.D.   On: 06/21/2013 19:32    assessment & Plan This unfortunate middle-aged [place] American female with history of Crohn's disease and multiple abdominal surgeries presenting with one-day history of abdominal pain nausea and vomiting with CT scan not conclusive for small bowel obstruction however as there was some gas distention th 1. abdominal pain with nonspecific bowel gas pattern by CT scan     Had episodes of nausea and vomiting     Patient has a long history of multiple abdominal surgeries and episodes of small bowel obstruction     Patient on TPN for nutrition     Ileostomy tube noted and draining     Check KUB in a.m.     Clear liquid diet 2. Leukocytosis     Monitor  Plan Can DC in a.m. if KUB is negative   DVT Prophylaxis Lovenox  AM Labs Ordered, also please review Full Orders  Family Communication: Admission, patients condition and plan of care including tests being ordered have been discussed with the patient and husband who indicate understanding and agree with the plan and Code Status.  Code Status full*  Disposition Plan: Home  Time spent in minutes : 34 minutes  Condition GUARDED

## 2013-06-21 NOTE — ED Notes (Signed)
Set up contact precautions on door before patient transfer.

## 2013-06-22 ENCOUNTER — Inpatient Hospital Stay (HOSPITAL_COMMUNITY): Payer: Medicare Other

## 2013-06-22 ENCOUNTER — Encounter (HOSPITAL_COMMUNITY): Payer: Self-pay | Admitting: Emergency Medicine

## 2013-06-22 ENCOUNTER — Observation Stay (HOSPITAL_COMMUNITY): Payer: Medicare Other

## 2013-06-22 DIAGNOSIS — R509 Fever, unspecified: Secondary | ICD-10-CM

## 2013-06-22 DIAGNOSIS — E860 Dehydration: Secondary | ICD-10-CM

## 2013-06-22 DIAGNOSIS — K4030 Unilateral inguinal hernia, with obstruction, without gangrene, not specified as recurrent: Secondary | ICD-10-CM | POA: Diagnosis present

## 2013-06-22 DIAGNOSIS — R109 Unspecified abdominal pain: Secondary | ICD-10-CM

## 2013-06-22 DIAGNOSIS — K56609 Unspecified intestinal obstruction, unspecified as to partial versus complete obstruction: Principal | ICD-10-CM

## 2013-06-22 DIAGNOSIS — D72829 Elevated white blood cell count, unspecified: Secondary | ICD-10-CM

## 2013-06-22 DIAGNOSIS — E119 Type 2 diabetes mellitus without complications: Secondary | ICD-10-CM

## 2013-06-22 DIAGNOSIS — E46 Unspecified protein-calorie malnutrition: Secondary | ICD-10-CM

## 2013-06-22 LAB — URINALYSIS, ROUTINE W REFLEX MICROSCOPIC
BILIRUBIN URINE: NEGATIVE
GLUCOSE, UA: NEGATIVE mg/dL
HGB URINE DIPSTICK: NEGATIVE
KETONES UR: NEGATIVE mg/dL
Leukocytes, UA: NEGATIVE
Nitrite: NEGATIVE
PROTEIN: NEGATIVE mg/dL
Specific Gravity, Urine: 1.025 (ref 1.005–1.030)
Urobilinogen, UA: 0.2 mg/dL (ref 0.0–1.0)
pH: 5.5 (ref 5.0–8.0)

## 2013-06-22 LAB — BASIC METABOLIC PANEL
BUN: 20 mg/dL (ref 6–23)
CALCIUM: 8.3 mg/dL — AB (ref 8.4–10.5)
CO2: 24 mEq/L (ref 19–32)
CREATININE: 0.95 mg/dL (ref 0.50–1.10)
Chloride: 106 mEq/L (ref 96–112)
GFR calc Af Amer: 72 mL/min — ABNORMAL LOW (ref >90)
GFR calc non Af Amer: 62 mL/min — ABNORMAL LOW (ref >90)
Glucose, Bld: 96 mg/dL (ref 70–99)
Potassium: 4.6 mEq/L (ref 3.7–5.3)
SODIUM: 141 meq/L (ref 137–147)

## 2013-06-22 LAB — CBC
HCT: 30 % — ABNORMAL LOW (ref 36.0–46.0)
Hemoglobin: 9.4 g/dL — ABNORMAL LOW (ref 12.0–15.0)
MCH: 28.5 pg (ref 26.0–34.0)
MCHC: 31.3 g/dL (ref 30.0–36.0)
MCV: 90.9 fL (ref 78.0–100.0)
Platelets: 213 10*3/uL (ref 150–400)
RBC: 3.3 MIL/uL — ABNORMAL LOW (ref 3.87–5.11)
RDW: 14.7 % (ref 11.5–15.5)
WBC: 11.8 10*3/uL — AB (ref 4.0–10.5)

## 2013-06-22 LAB — MAGNESIUM: Magnesium: 1.7 mg/dL (ref 1.5–2.5)

## 2013-06-22 LAB — PHOSPHORUS: Phosphorus: 3.4 mg/dL (ref 2.3–4.6)

## 2013-06-22 LAB — GLUCOSE, CAPILLARY: GLUCOSE-CAPILLARY: 181 mg/dL — AB (ref 70–99)

## 2013-06-22 MED ORDER — SODIUM CHLORIDE 0.9 % IJ SOLN
10.0000 mL | INTRAMUSCULAR | Status: DC | PRN
  Administered 2013-06-22 – 2013-06-23 (×3): 10 mL
  Administered 2013-06-24: 20 mL
  Administered 2013-06-25 – 2013-06-26 (×2): 10 mL
  Administered 2013-06-26: 20 mL
  Administered 2013-06-26 – 2013-06-27 (×3): 10 mL

## 2013-06-22 MED ORDER — MORPHINE SULFATE 2 MG/ML IJ SOLN
1.0000 mg | INTRAMUSCULAR | Status: DC | PRN
  Administered 2013-06-22 – 2013-06-23 (×4): 1 mg via INTRAVENOUS
  Filled 2013-06-22 (×5): qty 1

## 2013-06-22 MED ORDER — FAMOTIDINE IN NACL 20-0.9 MG/50ML-% IV SOLN
20.0000 mg | Freq: Two times a day (BID) | INTRAVENOUS | Status: DC
  Administered 2013-06-23 – 2013-06-26 (×7): 20 mg via INTRAVENOUS
  Filled 2013-06-22 (×10): qty 50

## 2013-06-22 MED ORDER — ZOLPIDEM TARTRATE 5 MG PO TABS: 5.0000 mg | ORAL_TABLET | Freq: Every evening | ORAL | Status: DC | PRN

## 2013-06-22 MED ORDER — CITALOPRAM HYDROBROMIDE 20 MG PO TABS
20.0000 mg | ORAL_TABLET | Freq: Every day | ORAL | Status: DC
  Administered 2013-06-22 – 2013-06-27 (×6): 20 mg via ORAL
  Filled 2013-06-22 (×6): qty 1

## 2013-06-22 MED ORDER — LIRAGLUTIDE 18 MG/3ML ~~LOC~~ SOLN: 0.9000 mg | Freq: Every day | SUBCUTANEOUS | Status: DC

## 2013-06-22 MED ORDER — LIRAGLUTIDE 18 MG/3ML ~~LOC~~ SOLN: 1.8000 mg | Freq: Every day | SUBCUTANEOUS | Status: DC

## 2013-06-22 MED ORDER — HYDROCODONE-ACETAMINOPHEN 5-325 MG PO TABS: 1.0000 | ORAL_TABLET | Freq: Four times a day (QID) | ORAL | Status: DC | PRN

## 2013-06-22 MED ORDER — ALBUTEROL SULFATE (2.5 MG/3ML) 0.083% IN NEBU: 2.5000 mg | INHALATION_SOLUTION | Freq: Four times a day (QID) | RESPIRATORY_TRACT | Status: DC | PRN

## 2013-06-22 MED ORDER — TIOTROPIUM BROMIDE MONOHYDRATE 18 MCG IN CAPS
18.0000 ug | ORAL_CAPSULE | Freq: Every day | RESPIRATORY_TRACT | Status: DC
  Administered 2013-06-22 – 2013-06-27 (×6): 18 ug via RESPIRATORY_TRACT
  Filled 2013-06-22 (×2): qty 5

## 2013-06-22 MED ORDER — FLUTTER DEVI: Freq: Two times a day (BID) | Status: DC

## 2013-06-22 MED ORDER — ACETAMINOPHEN 650 MG RE SUPP
650.0000 mg | Freq: Four times a day (QID) | RECTAL | Status: DC | PRN
  Administered 2013-06-22: 650 mg via RECTAL
  Filled 2013-06-22: qty 1

## 2013-06-22 MED ORDER — DILTIAZEM HCL ER BEADS 180 MG PO CP24
180.0000 mg | ORAL_CAPSULE | Freq: Two times a day (BID) | ORAL | Status: DC
Start: 2013-06-22 — End: 2013-06-22

## 2013-06-22 MED ORDER — ENOXAPARIN SODIUM 40 MG/0.4ML ~~LOC~~ SOLN
40.0000 mg | Freq: Every day | SUBCUTANEOUS | Status: DC
  Administered 2013-06-22 – 2013-06-25 (×4): 40 mg via SUBCUTANEOUS
  Filled 2013-06-22 (×5): qty 0.4

## 2013-06-22 MED ORDER — FUROSEMIDE 20 MG PO TABS
20.0000 mg | ORAL_TABLET | Freq: Two times a day (BID) | ORAL | Status: DC
  Administered 2013-06-22: 20 mg via ORAL
  Filled 2013-06-22 (×3): qty 1

## 2013-06-22 MED ORDER — INSULIN ASPART 100 UNIT/ML ~~LOC~~ SOLN
0.0000 [IU] | Freq: Three times a day (TID) | SUBCUTANEOUS | Status: DC
  Administered 2013-06-22: 2 [IU] via SUBCUTANEOUS
  Administered 2013-06-23 – 2013-06-25 (×5): 1 [IU] via SUBCUTANEOUS

## 2013-06-22 MED ORDER — SODIUM CHLORIDE 0.9 % IV SOLN
INTRAVENOUS | Status: DC
  Administered 2013-06-22: 1000 mL via INTRAVENOUS
  Administered 2013-06-22 – 2013-06-25 (×5): via INTRAVENOUS

## 2013-06-22 MED ORDER — FENTANYL CITRATE 0.05 MG/ML IJ SOLN: 25.0000 ug | INTRAMUSCULAR | Status: DC | PRN

## 2013-06-22 MED ORDER — MAGNESIUM SULFATE 40 MG/ML IJ SOLN
2.0000 g | Freq: Once | INTRAMUSCULAR | Status: AC
  Administered 2013-06-22: 2 g via INTRAVENOUS
  Filled 2013-06-22: qty 50

## 2013-06-22 MED ORDER — ONDANSETRON HCL 4 MG PO TABS: 4.0000 mg | ORAL_TABLET | Freq: Four times a day (QID) | ORAL | Status: DC | PRN

## 2013-06-22 MED ORDER — BUDESONIDE 0.25 MG/2ML IN SUSP
0.2500 mg | Freq: Two times a day (BID) | RESPIRATORY_TRACT | Status: DC
  Administered 2013-06-22 – 2013-06-27 (×10): 0.25 mg via RESPIRATORY_TRACT
  Filled 2013-06-22 (×14): qty 2

## 2013-06-22 MED ORDER — PANTOPRAZOLE SODIUM 40 MG PO TBEC
40.0000 mg | DELAYED_RELEASE_TABLET | Freq: Every day | ORAL | Status: DC
  Administered 2013-06-22: 40 mg via ORAL
  Filled 2013-06-22: qty 1

## 2013-06-22 MED ORDER — DILTIAZEM HCL ER COATED BEADS 180 MG PO CP24
180.0000 mg | ORAL_CAPSULE | Freq: Every day | ORAL | Status: DC
  Administered 2013-06-22: 180 mg via ORAL
  Filled 2013-06-22 (×2): qty 1

## 2013-06-22 MED ORDER — CELECOXIB 200 MG PO CAPS
200.0000 mg | ORAL_CAPSULE | Freq: Every day | ORAL | Status: DC
  Administered 2013-06-22: 200 mg via ORAL
  Filled 2013-06-22: qty 1

## 2013-06-22 MED ORDER — ONDANSETRON HCL 4 MG/2ML IJ SOLN: 4.0000 mg | Freq: Four times a day (QID) | INTRAMUSCULAR | Status: DC | PRN

## 2013-06-22 MED ORDER — PRO-STAT SUGAR FREE PO LIQD
30.0000 mL | Freq: Every day | ORAL | Status: DC
  Administered 2013-06-22 – 2013-06-26 (×5): 30 mL via ORAL
  Filled 2013-06-22 (×6): qty 30

## 2013-06-22 MED ORDER — SODIUM CHLORIDE 0.9 % IV BOLUS (SEPSIS)
500.0000 mL | Freq: Once | INTRAVENOUS | Status: AC
  Administered 2013-06-22: 500 mL via INTRAVENOUS

## 2013-06-22 MED ORDER — FAMOTIDINE 20 MG PO TABS
20.0000 mg | ORAL_TABLET | Freq: Two times a day (BID) | ORAL | Status: DC
  Administered 2013-06-22: 20 mg via ORAL
  Filled 2013-06-22 (×2): qty 1

## 2013-06-22 MED ORDER — ARFORMOTEROL TARTRATE 15 MCG/2ML IN NEBU
15.0000 ug | INHALATION_SOLUTION | Freq: Two times a day (BID) | RESPIRATORY_TRACT | Status: DC
  Administered 2013-06-22 – 2013-06-27 (×8): 15 ug via RESPIRATORY_TRACT
  Filled 2013-06-22 (×15): qty 2

## 2013-06-22 MED ORDER — FLUTICASONE PROPIONATE 50 MCG/ACT NA SUSP
2.0000 | Freq: Every day | NASAL | Status: DC
  Administered 2013-06-23 – 2013-06-27 (×5): 2 via NASAL
  Filled 2013-06-22 (×3): qty 16

## 2013-06-22 MED ORDER — POTASSIUM CHLORIDE IN NACL 20-0.9 MEQ/L-% IV SOLN: INTRAVENOUS | Status: DC

## 2013-06-22 MED ORDER — ATORVASTATIN CALCIUM 10 MG PO TABS
10.0000 mg | ORAL_TABLET | Freq: Every day | ORAL | Status: DC
  Filled 2013-06-22: qty 1

## 2013-06-22 MED ORDER — ASPIRIN EC 81 MG PO TBEC
81.0000 mg | DELAYED_RELEASE_TABLET | ORAL | Status: DC
  Administered 2013-06-22 – 2013-06-27 (×3): 81 mg via ORAL
  Filled 2013-06-22 (×3): qty 1

## 2013-06-22 MED ORDER — VITAMIN D 1000 UNITS PO TABS
1000.0000 [IU] | ORAL_TABLET | Freq: Every day | ORAL | Status: DC
  Administered 2013-06-22: 1000 [IU] via ORAL
  Filled 2013-06-22: qty 1

## 2013-06-22 MED ORDER — ACETAMINOPHEN 325 MG PO TABS
650.0000 mg | ORAL_TABLET | Freq: Four times a day (QID) | ORAL | Status: DC | PRN
  Administered 2013-06-22: 650 mg via ORAL

## 2013-06-22 MED ORDER — FAT EMULSION 20 % IV EMUL
250.0000 mL | INTRAVENOUS | Status: AC
  Administered 2013-06-22: 250 mL via INTRAVENOUS
  Filled 2013-06-22: qty 250

## 2013-06-22 MED ORDER — HYDROCODONE-ACETAMINOPHEN 5-325 MG PO TABS
1.0000 | ORAL_TABLET | ORAL | Status: DC | PRN
  Administered 2013-06-22 – 2013-06-23 (×2): 2 via ORAL
  Filled 2013-06-22 (×2): qty 2

## 2013-06-22 MED ORDER — STERILE WATER FOR INJECTION IV SOLN: INTRAVENOUS | Status: DC

## 2013-06-22 MED ORDER — DEXTROSE 5 % IV SOLN: 1.0000 g | INTRAVENOUS | Status: DC

## 2013-06-22 MED ORDER — ONDANSETRON HCL 4 MG/2ML IJ SOLN
4.0000 mg | Freq: Four times a day (QID) | INTRAMUSCULAR | Status: DC | PRN
Start: 2013-06-22 — End: 2013-06-27
  Administered 2013-06-24: 4 mg via INTRAVENOUS
  Filled 2013-06-22: qty 2

## 2013-06-22 MED ORDER — GUAIFENESIN ER 600 MG PO TB12
1200.0000 mg | ORAL_TABLET | Freq: Two times a day (BID) | ORAL | Status: DC
  Administered 2013-06-22: 1200 mg via ORAL
  Filled 2013-06-22 (×2): qty 2

## 2013-06-22 MED ORDER — SODIUM CHLORIDE 0.9 % IJ SOLN
10.0000 mL | Freq: Two times a day (BID) | INTRAMUSCULAR | Status: DC
  Administered 2013-06-22: 10 mL

## 2013-06-22 MED ORDER — TRACE MINERALS CR-CU-F-FE-I-MN-MO-SE-ZN IV SOLN
INTRAVENOUS | Status: AC
  Administered 2013-06-22: 18:00:00 via INTRAVENOUS
  Filled 2013-06-22: qty 2000

## 2013-06-22 MED ORDER — PANTOPRAZOLE SODIUM 40 MG IV SOLR
40.0000 mg | INTRAVENOUS | Status: DC
  Administered 2013-06-23 – 2013-06-25 (×3): 40 mg via INTRAVENOUS
  Filled 2013-06-22 (×6): qty 40

## 2013-06-22 MED ORDER — HYDROXYCHLOROQUINE SULFATE 200 MG PO TABS
200.0000 mg | ORAL_TABLET | Freq: Two times a day (BID) | ORAL | Status: DC
  Administered 2013-06-22: 200 mg via ORAL
  Filled 2013-06-22 (×2): qty 1

## 2013-06-22 MED ORDER — WHITE PETROLATUM GEL
Status: AC
  Administered 2013-06-23: 1
  Filled 2013-06-22: qty 5

## 2013-06-22 NOTE — Progress Notes (Signed)
.  BP 97/56  Pulse 115  Temp(Src) 101.2 F (38.4 C) (Oral)  Resp 18  Ht 5\' 1"  (1.549 m)  Wt 57.425 kg (126 lb 9.6 oz)  BMI 23.93 kg/m2  SpO2 100%    MD at bedside and aware of patient's vital signs. Urine culture sent. Normal saline 500cc bolus and tylenol given. Chest x-ray and blood cultures x2 ordered. Will continue to monitor patient.

## 2013-06-22 NOTE — Progress Notes (Addendum)
PARENTERAL NUTRITION CONSULT NOTE - INITIAL  Pharmacy Consult for TPN Indication: Home TPN for long history of Crohn's disease, recent small bowel obstruction and pelvic abscess and subsequent partial small bowel obstruction   Allergies  Allergen Reactions  . Esomeprazole Magnesium Other (See Comments)    NEXIUM - reaction > aggravated pt's Crohn's disease  . Shellfish Allergy Shortness Of Breath and Nausea And Vomiting  . Surgical Lubricant Other (See Comments)    Burns skin   . Other Nausea And Vomiting    Beans, Dander/Dust, Peas, Mushrooms  . Peanut-Containing Drug Products Nausea And Vomiting  . Levaquin [Levofloxacin]     Patient Measurements: Height: 5' 1"  (154.9 cm) Weight: 126 lb 9.6 oz (57.425 kg) IBW/kg (Calculated) : 47.8 Usual Weight: 57 kg  Vital Signs: Temp: 100.9 F (38.3 C) (01/28 1051) Temp src: Oral (01/28 1051) BP: 95/50 mmHg (01/28 1051) Pulse Rate: 119 (01/28 1051) Intake/Output from previous day: 01/27 0701 - 01/28 0700 In: -  Out: 400 [Urine:75; Emesis/NG output:100; Stool:225] Intake/Output from this shift: Total I/O In: 120 [P.O.:120] Out: -   Labs:  Recent Labs  06/21/13 1522 06/22/13 0805  WBC 13.5* 11.8*  HGB 10.6* 9.4*  HCT 32.4* 30.0*  PLT 224 213     Recent Labs  06/21/13 1522 06/22/13 0805 06/22/13 1231  NA 143 141  --   K 4.6 4.6  --   CL 104 106  --   CO2 29 24  --   GLUCOSE 79 96  --   BUN 25* 20  --   CREATININE 0.78 0.95  --   CALCIUM 8.8 8.3*  --   MG  --   --  1.7  PHOS  --   --  3.4  PROT 6.8  --   --   ALBUMIN 3.1*  --   --   AST 23  --   --   ALT 82*  --   --   ALKPHOS 182*  --   --   BILITOT 0.2*  --   --    Estimated Creatinine Clearance: 49.4 ml/min (by C-G formula based on Cr of 0.95).   No results found for this basename: GLUCAP,  in the last 72 hours  Medical History: Past Medical History  Diagnosis Date  . Other diseases of vocal cords   . Osteoporosis, unspecified   . Other and  unspecified hyperlipidemia   . Esophageal reflux   . Chronic airway obstruction, not elsewhere classified   . Regional enteritis of unspecified site   . 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 Crohn's disease     Ileostomy  . Pelvic adhesions   . Anxiety   . Depression   . Hypertension   . Emphysema   . Hematuria   . Severe cervical dysplasia age 18    cone biopsy with neg paps since  . Pneumonia     "3-4 times; necrotizing once" (04/19/2013)  . Asthmatic bronchitis , chronic   . Exertional shortness of breath   . On home oxygen therapy     "24/7; 2.5L @ rest; 3L when I'm doing my chores" (04/19/2013)  . Type II diabetes mellitus   . Anemia   . History of blood transfusion     "I've had 3 or 4" (04/19/2013)  . Rheumatoid arthritis(714.0) 03/2012    [place]; "mild in my hands" (04/19/2013)  . Small bowel obstruction due to adhesions 2014    [place]  . Pelvic abscess in  female 2014    Medications:  Prescriptions prior to admission  Medication Sig Dispense Refill  . Amino Acids-Protein Hydrolys (FEEDING SUPPLEMENT, PRO-STAT SUGAR FREE 64,) LIQD Take 30 mLs by mouth daily.  900 mL  0  . arformoterol (BROVANA) 15 MCG/2ML NEBU Take 15 mcg by nebulization 2 (two) times daily.      [name] aspirin 81 MG tablet Take 81 mg by mouth every Monday, Wednesday, and Friday.       . budesonide (PULMICORT) 0.25 MG/2ML nebulizer solution Take 0.25 mg by nebulization 2 (two) times daily.      . calcium-vitamin D (OSCAL WITH D) 500-200 MG-UNIT per tablet Take 1 tablet by mouth 2 (two) times daily.      . celecoxib (CELEBREX) 200 MG capsule Take 200 mg by mouth daily.      . Cholecalciferol (VITAMIN D) 1000 UNITS capsule Take 1,000 Units by mouth daily.       . citalopram (CELEXA) 20 MG tablet Take 20 mg by mouth daily.       [name] diltiazem (TIAZAC) 180 MG 24 hr capsule Take 180 mg by mouth 2 (two) times daily.      . famotidine (PEPCID) 20 MG tablet Take 20 mg by mouth 2 (two) times  daily.      . fluticasone (FLONASE) 50 MCG/ACT nasal spray Place 2 sprays into the nose daily as needed.       . furosemide (LASIX) 20 MG tablet Take 20 mg by mouth 2 (two) times daily. Once or twice daily as needed for swelling      . guaiFENesin (MUCINEX) 600 MG 12 hr tablet Take 1,200 mg by mouth 2 (two) times daily.      [name] HYDROcodone-acetaminophen (NORCO/VICODIN) 5-325 MG per tablet Take 1-2 tablets by mouth every 6 (six) hours as needed for moderate pain or severe pain.  40 tablet  0  . hydroxychloroquine (PLAQUENIL) 200 MG tablet Take 200 mg by mouth Twice daily.       . IRON PO Take 1 tablet by mouth every Monday, Wednesday, and Friday.       . Liraglutide (VICTOZA) 18 MG/3ML SOLN Inject 1.8 mg into the skin daily. 1.8 units once daily      . LORazepam (ATIVAN) 0.5 MG tablet Take 0.5 mg by mouth every 8 (eight) hours as needed for anxiety.      [name] omeprazole (PRILOSEC) 40 MG capsule Take 40 mg by mouth 2 (two) times daily.       . ondansetron (ZOFRAN) 4 MG tablet Take 1 tablet (4 mg total) by mouth every 6 (six) hours as needed for nausea.  20 tablet  0  . PROAIR HFA 108 (90 BASE) MCG/ACT inhaler Inhale 2 puffs into the lungs every 6 (six) hours as needed for wheezing or shortness of breath.       . rosuvastatin (CRESTOR) 5 MG tablet Take 5 mg by mouth every Monday, Wednesday, and Friday.       . tiotropium (SPIRIVA) 18 MCG inhalation capsule Place 18 mcg into inhaler and inhale daily.      . TPN ADULT Inject into the vein continuous. On at 9am off at 9pm   144.60m/hr  (17348mover 12 hours)      . zoledronic acid (RECLAST) 5 MG/100ML SOLN injection Inject 5 mg into the vein once. Once a year in May      . Respiratory Therapy Supplies (FLUTTER) DEVI Inhale into the lungs. 2-4 times daily  Insulin Requirements in the past 24 hours:  None  Current Nutrition:  Clear liquids currently PTA: cyclic TPN and small amounts of soft/chopped diet (12 hr cyclic TPN at home provides 78 gm  protein, 1467 kcal daily)  Nutritional Goals:  Kcal: 1550-1780; Protein: 70-85 grams (per RD note 1/28)  Assessment: 63 y.o. female presents with N/V x 1 day. Noted past medical history significant for Crohn's disease status post ileostomy, multiple abscesses and small bowel obstructions in the past and multiple surgeries for adhesional lysis and small bowel obstruction in addition to subtotal colectomy. Pt on cyclic TPN at home and small amounts of soft foods.  GI: Abd pain, N/V. CT scan not conclusive for SBO. Pt with h/o multiple bowel obstuctions. Clear liquid diet. PPI po and pepcid po (on both pepcid and omeprazole PTA)  Endo: DM 2. No insulin in home TPN. CBGs thus far here have been low but no D5 being given. (liraglutide PTA - awaiting pt to bring in from home)  Lytes: Mg 1.7 - MD replacing. Other lytes wnl.  Renal: SCr wnl at baseline. MIVF NS at 75 ml/hr.  Pulm: 3L [place]. H/o obstructive chronic bronchitis; home O2. Arfomoterol, budesonide.  Cards: BP low-nl, HR 110s. HTN. ASA 81, statin, dilt, lasix.  Hepatobil: Alk phos, ALT elevated.   Neuro: anxiety, depression. citalopram  ID: Tm 100.9. Wbc elevated but trending down. No abx  Best Practices: Enox 40  TPN Access: PICC (PTA)  Plan:  1. Start cyclic clinimix E 8/71 over 12 hour cycle (1m/hr for 1 hr then 145 ml/hr for 10 hrs then 573mhr for 1 hr) and 20% fat emulsion at 16 ml/hr for 12 hours which will provide ~78 gm protein, 1485 kcal per day. (This most closely matches pt's home TPN formulation) 2. Will d/c MIVF when TPN hangs at 1800. 3. Empiric CBGs and sensitive SSI q8h  4. Daily MVI and trace elements 5. Will f/u TPN labs  CaSherlon HandingPharmD, BCPS Clinical pharmacist, pager [phone]/28/2015,12:51 PM

## 2013-06-22 NOTE — Consult Note (Signed)
Patient well known to our service.  Will follow with you and discuss with Dr. Johna Sheriff.  Marta Lamas. Gae Bon, MD, FACS [phone] [facility]

## 2013-06-22 NOTE — Progress Notes (Signed)
INITIAL NUTRITION ASSESSMENT  DOCUMENTATION CODES Per approved criteria  -Not Applicable   INTERVENTION: Recommend re-initiating TPN  Diet advancement per MD Continue pro-stat once daily RD to continue to monitor  NUTRITION DIAGNOSIS: Inadequate oral intake related to abdominal pain as evidenced by TPN, clear liquid diet, and meal completion <50% .   Goal: Pt to meet >/= 90% of their estimated nutrition needs    Monitor:  TPN initiation/rate PO intake Weight trends Labs  Reason for Assessment: TPN  63 y.o. female  Admitting Dx: Abdominal pain  ASSESSMENT: 63 y.o. female, with past medical history significant for Crohn's disease status post ileostomy, multiple abscesses and small bowel obstructions in the past and multiple surgeries for adhesional lysis and small bowel obstruction in addition to subtotal colectomy presenting with abdominal pain of one-day duration with nausea and vomiting.   Pt is on clear liquids and consumed about 50% of breakfast tray; pt reports having abdominal pain after breakfast. She reports receiving TPN at home for 12 hours daily with oral intake of Pro-stat once daily and small amounts of soft food. Food choices are limited to mostly fish, chicken, potatoes, applesauce, etc. Pt reports regaining weight recently with most recent weight of 110 lbs.   Height: Ht Readings from Last 1 Encounters:  06/22/13 5\' 1"  (1.549 m)    Weight: Wt Readings from Last 1 Encounters:  06/22/13 126 lb 9.6 oz (57.425 kg)    Ideal Body Weight: 105 lbs  % Ideal Body Weight: 120%  Wt Readings from Last 10 Encounters:  06/22/13 126 lb 9.6 oz (57.425 kg)  06/15/13 117 lb (53.071 kg)  06/03/13 115 lb (52.164 kg)  05/11/13 95 lb 3.8 oz (43.2 kg)  04/26/13 110 lb 4.8 oz (50.032 kg)  04/19/13 109 lb 2 oz (49.5 kg)  04/11/13 109 lb (49.442 kg)  03/30/13 126 lb 12.2 oz (57.5 kg)  03/16/13 144 lb 10 oz (65.6 kg)  03/16/13 144 lb 10 oz (65.6 kg)    Usual Body  Weight: 126 lbs  % Usual Body Weight: 100%  BMI:  Body mass index is 23.93 kg/(m^2).  Estimated Nutritional Needs: Kcal: 1550-1780 Protein: 70-85 grams Fluid: 1.5-1.7 L/day  Skin: WDL  Diet Order: Clear Liquid  EDUCATION NEEDS: -No education needs identified at this time   Intake/Output Summary (Last 24 hours) at 06/22/13 0954 Last data filed at 06/22/13 0900  Gross per 24 hour  Intake    120 ml  Output    400 ml  Net   -280 ml    Last BM: 1/28   Labs:   Recent Labs Lab 06/21/13 1522 06/22/13 0805  NA 143 141  K 4.6 4.6  CL 104 106  CO2 29 24  BUN 25* 20  CREATININE 0.78 0.95  CALCIUM 8.8 8.3*  GLUCOSE 79 96    CBG (last 3)  No results found for this basename: GLUCAP,  in the last 72 hours  Scheduled Meds: . [COMPLETED] sodium chloride   Intravenous STAT  . arformoterol  15 mcg Nebulization BID  . aspirin EC  81 mg Oral Q M,W,F  . atorvastatin  10 mg Oral q1800  . budesonide  0.25 mg Nebulization BID  . celecoxib  200 mg Oral Daily  . cholecalciferol  1,000 Units Oral Daily  . citalopram  20 mg Oral Daily  . diltiazem  180 mg Oral Daily  . enoxaparin (LOVENOX) injection  40 mg Subcutaneous Daily  . famotidine  20 mg Oral BID  .  feeding supplement (PRO-STAT SUGAR FREE 64)  30 mL Oral Daily  . fluticasone  2 spray Each Nare Daily  . furosemide  20 mg Oral BID  . guaiFENesin  1,200 mg Oral BID  . hydroxychloroquine  200 mg Oral BID  . Liraglutide  1.8 mg Subcutaneous Daily  . pantoprazole  40 mg Oral Daily  . sodium chloride  10-40 mL Intracatheter Q12H  . tiotropium  18 mcg Inhalation Daily    Continuous Infusions:   Past Medical History  Diagnosis Date  . Other diseases of vocal cords   . Osteoporosis, unspecified   . Other and unspecified hyperlipidemia   . Esophageal reflux   . Chronic airway obstruction, not elsewhere classified   . Regional enteritis of unspecified site   . 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  Crohn's disease     Ileostomy  . Pelvic adhesions   . Anxiety   . Depression   . Hypertension   . Emphysema   . Hematuria   . Severe cervical dysplasia age 7    cone biopsy with neg paps since  . Pneumonia     "3-4 times; necrotizing once" (04/19/2013)  . Asthmatic bronchitis , chronic   . Exertional shortness of breath   . On home oxygen therapy     "24/7; 2.5L @ rest; 3L when I'm doing my chores" (04/19/2013)  . Type II diabetes mellitus   . Anemia   . History of blood transfusion     "I've had 3 or 4" (04/19/2013)  . Rheumatoid arthritis(714.0) 03/2012    [place]; "mild in my hands" (04/19/2013)  . Small bowel obstruction due to adhesions 2014    [place]  . Pelvic abscess in female 2014    Past Surgical History  Procedure Laterality Date  . Ileostomy  2007  . Ear cyst excision Bilateral 1970's    "lobes"   . Cervical cone biopsy  1978  . Colon surgery  2007    subtotal colectomy with ileostomy  . Esophagogastroduodenoscopy (egd) with esophageal dilation      "3 times, I think" (04/19/2013)  . Oophorectomy      [facility]  . Tracheostomy  2010  . Hammer toe surgery Bilateral 1980's  . Balloon dilation  02/10/2012    Procedure: BALLOON DILATION;  Surgeon: Charolett Bumpers, MD;  Location: [facility];  Service: Endoscopy;  Laterality: N/A;  . Colposcopy    . Flexible sigmoidoscopy  03/26/2012    Procedure: FLEXIBLE SIGMOIDOSCOPY;  Surgeon: Charolett Bumpers, MD;  Location: [facility];  Service: Endoscopy;  Laterality: N/A;  . Cardiac catheterization  07/08/2007    normal  . Nm myocar perf wall motion  09/21/2006    no ischemia  . Ventral hernia repair N/A 03/16/2013    Procedure: REPAIR OF INCISIONAL AND PARASTOMAL HERNIAS;  Surgeon: Mariella Saa, MD;  Location: [facility] OR;  Service: General;  Laterality: N/A;  . Laparotomy N/A 03/16/2013    Procedure: EXPLORATORY LAPAROTOMY;  Surgeon: Mariella Saa, MD;  Location: [facility] OR;  Service: General;  Laterality: N/A;  .  Lysis of adhesion N/A 03/16/2013    Procedure: LYSIS OF ADHESIONS FOR SMALL BOWEL OBSTRUCTION;  Surgeon: Mariella Saa, MD;  Location: [facility] OR;  Service: General;  Laterality: N/A;  . Insertion of mesh N/A 03/16/2013    Procedure: INSERTION OF BIOLOGIC MESH;  Surgeon: Mariella Saa, MD;  Location: [facility] OR;  Service: General;  Laterality: N/A;  . Tee without cardioversion  N/A 03/21/2013    Procedure: TRANSESOPHAGEAL ECHOCARDIOGRAM (TEE);  Surgeon: Vesta Mixer, MD;  Location: [facility] END[facility]COPY;  Service: Cardiovascular;  Laterality: N/A;  . Vaginal hysterectomy  1993    [facility]  . Tracheostomy closure  2010    Ian Malkin RD, LDN Inpatient Clinical Dietitian Pager: [phone] After Hours Pager: [phone]

## 2013-06-22 NOTE — Progress Notes (Signed)
   CARE MANAGEMENT NOTE 06/22/2013  Patient:  [name],[name]   Account Number:  [id number]  Date Initiated:  06/22/2013  Documentation initiated by:  Jiles Crocker  Subjective/Objective Assessment:   ADMITTED WITH ABDOMINAL PAIN, ? BOWEL OBSTRUCTION     Action/Plan:   CM FOLLOWING FOR DCP   Anticipated DC Date:  06/27/2013   Anticipated DC Plan:  HOME/SELF CARE     DC Planning Services  CM consult          Status of service:  In process, will continue to follow Medicare Important Message given?  NA - LOS <3 / Initial given by admissions (If response is "NO", the following Medicare IM given date fields will be blank)  Per UR Regulation:  Reviewed for med. necessity/level of care/duration of stay  Comments:  1/28/2015Abelino Derrick RN,BSN,MHA [phone]

## 2013-06-22 NOTE — Consult Note (Signed)
Reason for Consult: abdominal pain Referring Physician: Dr. Irine Seal    HPI: [name] is an 62 year old patient of Dr. Excell Seltzer with a history of COPD, crohn's disease, diabetes mellitus, anemia, s/p exploratory laparotomy on 03/16/13, previous ileostomy. She was hospitalized 03/27/13-04/06/13 due to a pelvic abscess which required IR drain placement. She was admitted again 11/25-11/26 with n/v and abdominal pain. She was found to have a decreasing intra-abdominal abscess unable to be drained by IR. She improved overnight and was discharged home the following day. She was readmitted 12/1-12/5/14 for similar symptoms and again 05/07/13-05/11/13.  At the last admission she had a water soluble UGI with small bowel follow through contrast study was performed on 05/09/13 which showed a partial small bowel obstruction with a transition point in the mid pelvis. Due to recent surgery, it was decided that the patient will be discharged on full liquid diet, TPN which she tolerated well.  She was doing quite well and followed up with Dr. Excell Seltzer on 06/03/13 at which time she began eating solid food. It was recommended that she can eat soft foods which she has been tolerating well.  She reports that yesterday morning around 2AM she developed abdominal pain.  Location is mid abdomen without radiation.  Characterized as sharp, cramping pain.  Associated with nausea and vomiting.  Modifying factors include; pain medication x2 doses without any relief.  She therefore preceded to go to the ED.  She reports adequate ileostomy output, flatus.  She reports increase in weight, about 12 pounds.  Denies melena or hematochezia.  She has not vomited since yesterday.  She had clear liquids this morning, however, had worsening pain.  A CT of abdomen and pelvis showed resolution of previous abscesses, small bowel dilation, small bowel mesentery edema, no free air or transition point.  White count of 11.8k, fevers today.  Her  blood pressure is soft today, tachycardia, lactic acid is .8.  KUB today showed persistent dilatation of small bowel.  She denies vomiting today.  At present time, her pain is much improved, it worsens with oral intake.     Past Medical History  Diagnosis Date  . Other diseases of vocal cords   . Osteoporosis, unspecified   . Other and unspecified hyperlipidemia   . Esophageal reflux   . Chronic airway obstruction, not elsewhere classified   . Regional enteritis of unspecified site   . 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 Crohn's disease     Ileostomy  . Pelvic adhesions   . Anxiety   . Depression   . Hypertension   . Emphysema   . Hematuria   . Severe cervical dysplasia age 35    cone biopsy with neg paps since  . Pneumonia     "3-4 times; necrotizing once" (04/19/2013)  . Asthmatic bronchitis , chronic   . Exertional shortness of breath   . On home oxygen therapy     "24/7; 2.5L @ rest; 3L when I'm doing my chores" (04/19/2013)  . Type II diabetes mellitus   . Anemia   . History of blood transfusion     "I've had 3 or 4" (04/19/2013)  . Rheumatoid arthritis(714.0) 03/2012    [place]; "mild in my hands" (04/19/2013)  . Small bowel obstruction due to adhesions 2014    [place]  . Pelvic abscess in female 2014    Past Surgical History  Procedure Laterality Date  . Ileostomy  2007  . Ear cyst excision Bilateral 1970's    "  lobes"   . Cervical cone biopsy  1978  . Colon surgery  2007    subtotal colectomy with ileostomy  . Esophagogastroduodenoscopy (egd) with esophageal dilation      "3 times, I think" (04/19/2013)  . Oophorectomy      [facility]  . Tracheostomy  2010  . Hammer toe surgery Bilateral 1980's  . Balloon dilation  02/10/2012    Procedure: BALLOON DILATION;  Surgeon: Garlan Fair, MD;  Location: [facility];  Service: Endoscopy;  Laterality: N/A;  . Colposcopy    . Flexible sigmoidoscopy  03/26/2012    Procedure: FLEXIBLE  SIGMOIDOSCOPY;  Surgeon: Garlan Fair, MD;  Location: [facility];  Service: Endoscopy;  Laterality: N/A;  . Cardiac catheterization  07/08/2007    normal  . Nm myocar perf wall motion  09/21/2006    no ischemia  . Ventral hernia repair N/A 03/16/2013    Procedure: REPAIR OF INCISIONAL AND PARASTOMAL HERNIAS;  Surgeon: Edward Jolly, MD;  Location: [place];  Service: General;  Laterality: N/A;  . Laparotomy N/A 03/16/2013    Procedure: EXPLORATORY LAPAROTOMY;  Surgeon: Edward Jolly, MD;  Location: [place];  Service: General;  Laterality: N/A;  . Lysis of adhesion N/A 03/16/2013    Procedure: LYSIS OF ADHESIONS FOR SMALL BOWEL OBSTRUCTION;  Surgeon: Edward Jolly, MD;  Location: [place];  Service: General;  Laterality: N/A;  . Insertion of mesh N/A 03/16/2013    Procedure: INSERTION OF BIOLOGIC MESH;  Surgeon: Edward Jolly, MD;  Location: [place];  Service: General;  Laterality: N/A;  . Tee without cardioversion N/A 03/21/2013    Procedure: TRANSESOPHAGEAL ECHOCARDIOGRAM (TEE);  Surgeon: Thayer Headings, MD;  Location: [place];  Service: Cardiovascular;  Laterality: N/A;  . Vaginal hysterectomy  1993    [facility]  . Tracheostomy closure  2010    Family History  Problem Relation Age of Onset  . Osteoporosis Mother   . Kidney disease Mother   . Hypertension Sister   . Stroke Sister   . Diabetes Sister     Social History:  reports that she quit smoking about 9 years ago. Her smoking use included Cigarettes. She has a 32 pack-year smoking history. She has never used smokeless tobacco. She reports that she drinks alcohol. She reports that she does not use illicit drugs.  Allergies:  Allergies  Allergen Reactions  . Esomeprazole Magnesium Other (See Comments)    NEXIUM - reaction > aggravated pt's Crohn's disease  . Shellfish Allergy Shortness Of Breath and Nausea And Vomiting  . Surgical Lubricant Other (See Comments)    Burns skin   . Other Nausea And  Vomiting    Beans, Dander/Dust, Peas, Mushrooms  . Peanut-Containing Drug Products Nausea And Vomiting  . Levaquin [Levofloxacin]     Medications:  Continuous: . sodium chloride 100 mL/hr at 06/22/13 1434  . Marland KitchenTPN (CLINIMIX-E) Adult     And  . fat emulsion     acetaminophen, acetaminophen, albuterol, HYDROcodone-acetaminophen, LORazepam, morphine injection, ondansetron (ZOFRAN) IV, ondansetron, sodium chloride, zolpidem  Results for orders placed during the hospital encounter of 06/21/13 (from the past 48 hour(s))  COMPREHENSIVE METABOLIC PANEL     Status: Abnormal   Collection Time    06/21/13  3:22 PM      Result Value Range   Sodium 143  137 - 147 mEq/L   Potassium 4.6  3.7 - 5.3 mEq/L   Chloride 104  96 - 112 mEq/L   CO2 29  19 - 32 mEq/L   Glucose, Bld 79  70 - 99 mg/dL   BUN 25 (*) 6 - 23 mg/dL   Creatinine, Ser 0.78  0.50 - 1.10 mg/dL   Calcium 8.8  8.4 - 10.5 mg/dL   Total Protein 6.8  6.0 - 8.3 g/dL   Albumin 3.1 (*) 3.5 - 5.2 g/dL   AST 23  0 - 37 U/L   ALT 82 (*) 0 - 35 U/L   Alkaline Phosphatase 182 (*) 39 - 117 U/L   Total Bilirubin 0.2 (*) 0.3 - 1.2 mg/dL   GFR calc non Af Amer 87 (*) >90 mL/min   GFR calc Af Amer >90  >90 mL/min   Comment: (NOTE)     The eGFR has been calculated using the CKD EPI equation.     This calculation has not been validated in all clinical situations.     eGFR's persistently <90 mL/min signify possible Chronic Kidney     Disease.  LIPASE, BLOOD     Status: None   Collection Time    06/21/13  3:22 PM      Result Value Range   Lipase 29  11 - 59 U/L  CBC WITH DIFFERENTIAL     Status: Abnormal   Collection Time    06/21/13  3:22 PM      Result Value Range   WBC 13.5 (*) 4.0 - 10.5 K/uL   RBC 3.66 (*) 3.87 - 5.11 MIL/uL   Hemoglobin 10.6 (*) 12.0 - 15.0 g/dL   HCT 32.4 (*) 36.0 - 46.0 %   MCV 88.5  78.0 - 100.0 fL   MCH 29.0  26.0 - 34.0 pg   MCHC 32.7  30.0 - 36.0 g/dL   RDW 14.7  11.5 - 15.5 %   Platelets 224  150 - 400  K/uL   Neutrophils Relative % 72  43 - 77 %   Neutro Abs 9.7 (*) 1.7 - 7.7 K/uL   Lymphocytes Relative 14  12 - 46 %   Lymphs Abs 1.9  0.7 - 4.0 K/uL   Monocytes Relative 6  3 - 12 %   Monocytes Absolute 0.8  0.1 - 1.0 K/uL   Eosinophils Relative 8 (*) 0 - 5 %   Eosinophils Absolute 1.1 (*) 0.0 - 0.7 K/uL   Basophils Relative 0  0 - 1 %   Basophils Absolute 0.0  0.0 - 0.1 K/uL  LACTIC ACID, PLASMA     Status: None   Collection Time    06/21/13  3:44 PM      Result Value Range   Lactic Acid, Venous 0.8  0.5 - 2.2 mmol/L  URINALYSIS, ROUTINE W REFLEX MICROSCOPIC     Status: Abnormal   Collection Time    06/21/13  5:08 PM      Result Value Range   Color, Urine YELLOW  YELLOW   APPearance CLOUDY (*) CLEAR   Specific Gravity, Urine 1.024  1.005 - 1.030   pH 6.0  5.0 - 8.0   Glucose, UA NEGATIVE  NEGATIVE mg/dL   Hgb urine dipstick MODERATE (*) NEGATIVE   Bilirubin Urine NEGATIVE  NEGATIVE   Ketones, ur NEGATIVE  NEGATIVE mg/dL   Protein, ur NEGATIVE  NEGATIVE mg/dL   Urobilinogen, UA 0.2  0.0 - 1.0 mg/dL   Nitrite NEGATIVE  NEGATIVE   Leukocytes, UA SMALL (*) NEGATIVE  URINE MICROSCOPIC-ADD ON     Status: Abnormal   Collection Time    06/21/13  5:08 PM      Result Value Range   Squamous Epithelial / LPF FEW (*) RARE   WBC, UA 0-2  <3 WBC/hpf   RBC / HPF 0-2  <3 RBC/hpf   Casts HYALINE CASTS (*) NEGATIVE  URINE CULTURE     Status: None   Collection Time    06/21/13  5:09 PM      Result Value Range   Specimen Description URINE, CLEAN CATCH     Special Requests NONE     Culture  Setup Time       Value: 06/21/2013 18:15     Performed at [facility] PENDING     Culture       Value: Culture reincubated for better growth     Performed at [facility]   Report Status PENDING    CBC     Status: Abnormal   Collection Time    06/22/13  8:05 AM      Result Value Range   WBC 11.8 (*) 4.0 - 10.5 K/uL   RBC 3.30 (*) 3.87 - 5.11 MIL/uL    Hemoglobin 9.4 (*) 12.0 - 15.0 g/dL   HCT 30.0 (*) 36.0 - 46.0 %   MCV 90.9  78.0 - 100.0 fL   MCH 28.5  26.0 - 34.0 pg   MCHC 31.3  30.0 - 36.0 g/dL   RDW 14.7  11.5 - 15.5 %   Platelets 213  150 - 400 K/uL  BASIC METABOLIC PANEL     Status: Abnormal   Collection Time    06/22/13  8:05 AM      Result Value Range   Sodium 141  137 - 147 mEq/L   Potassium 4.6  3.7 - 5.3 mEq/L   Chloride 106  96 - 112 mEq/L   CO2 24  19 - 32 mEq/L   Glucose, Bld 96  70 - 99 mg/dL   BUN 20  6 - 23 mg/dL   Creatinine, Ser 0.95  0.50 - 1.10 mg/dL   Calcium 8.3 (*) 8.4 - 10.5 mg/dL   GFR calc non Af Amer 62 (*) >90 mL/min   GFR calc Af Amer 72 (*) >90 mL/min   Comment: (NOTE)     The eGFR has been calculated using the CKD EPI equation.     This calculation has not been validated in all clinical situations.     eGFR's persistently <90 mL/min signify possible Chronic Kidney     Disease.  MAGNESIUM     Status: None   Collection Time    06/22/13 12:31 PM      Result Value Range   Magnesium 1.7  1.5 - 2.5 mg/dL  PHOSPHORUS     Status: None   Collection Time    06/22/13 12:31 PM      Result Value Range   Phosphorus 3.4  2.3 - 4.6 mg/dL  URINALYSIS, ROUTINE W REFLEX MICROSCOPIC     Status: None   Collection Time    06/22/13  2:34 PM      Result Value Range   Color, Urine YELLOW  YELLOW   APPearance CLEAR  CLEAR   Specific Gravity, Urine 1.025  1.005 - 1.030   pH 5.5  5.0 - 8.0   Glucose, UA NEGATIVE  NEGATIVE mg/dL   Hgb urine dipstick NEGATIVE  NEGATIVE   Bilirubin Urine NEGATIVE  NEGATIVE   Ketones, ur NEGATIVE  NEGATIVE mg/dL   Protein, ur NEGATIVE  NEGATIVE mg/dL   Urobilinogen, UA 0.2  0.0 - 1.0 mg/dL   Nitrite NEGATIVE  NEGATIVE   Leukocytes, UA NEGATIVE  NEGATIVE   Comment: MICROSCOPIC NOT DONE ON URINES WITH NEGATIVE PROTEIN, BLOOD, LEUKOCYTES, NITRITE, OR GLUCOSE <1000 mg/dL.    Abd 1 View (kub)  06/22/2013   CLINICAL DATA:  Followup small bowel obstruction  EXAM: ABDOMEN - 1 VIEW   COMPARISON:  CT abdomen and pelvis 06/21/2013  FINDINGS: Right lower quadrant ostomy.  Excreted contrast material within bladder and minimally in right renal collecting system.  Single dilated loop of small bowel in the left pelvis consistent with small bowel obstruction.  No definite bowel wall thickening seen.  Endovascular coils noted right paraspinal at L1-L2.  Osseous structures unremarkable.  IMPRESSION: Persistent dilatation of a small bowel loop in the left pelvis consistent with obstruction.   Electronically Signed   By: Lavonia Dana M.D.   On: 06/22/2013 08:30   Ct Abdomen Pelvis W Contrast  06/21/2013   CLINICAL DATA:  Upper abdominal pain.  EXAM: CT ABDOMEN AND PELVIS WITH CONTRAST  TECHNIQUE: Multidetector CT imaging of the abdomen and pelvis was performed using the standard protocol following bolus administration of intravenous contrast.  CONTRAST:  112m OMNIPAQUE IOHEXOL 300 MG/ML  SOLN  COMPARISON:  CT of the abdomen and pelvis 05/06/2013.  FINDINGS: Lung Bases: Unremarkable.  Abdomen/Pelvis: The appearance of the liver, gallbladder, pancreas, spleen, bilateral adrenal glands and right kidney is unremarkable. Several sub cm low-attenuation lesions are noted in the left kidney, similar to the prior study; although too small to definitively characterize, these are favored to represent tiny cysts. High density material posterior to the pre-pyloric gastric antrum, presumably embolization coils.  Postoperative changes of total colectomy with right lower quadrant ileostomy and Hartmann's pouch are again noted. There are several areas acute angulation of small bowel, best demonstrated on image 55 of series 2 adjacent to the dilated loop, where there is an acutely angulated loop of bowel, presumably related to underlying adhesions. There is a small amount a haziness in the small bowel mesentery. Trace volume of ascites. No pneumoperitoneum. No definite lymphadenopathy identified within the abdomen or  pelvis. Status post hysterectomy. Previously noted lesion associated with the vaginal cuff is less cystic in appearance and smaller, currently measuring 2.7 x 1.6 cm (image 60 of series 2), presumably some involuting postoperative scar tissue. This does appear intimately associated with multiple adjacent loops of small bowel, suggesting the presence of adhesions adjacent to the vaginal cuff. Urinary bladder is unremarkable in appearance.  Musculoskeletal: There are no aggressive appearing lytic or blastic lesions noted in the visualized portions of the skeleton.  IMPRESSION: 1. 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 There is fecalization of small bowel contents in this region. Despite the presence of dilated loops of small bowel, the more proximal portion of the small bowel does not appear dilated, suggesting against frank obstruction at this time. 2. Small amount of edema in the small bowel mesentery and trace volume of ascites may suggest some active inflammation in this patient with history of Crohn's disease. 3. No pneumoperitoneum. 4. Additional incidental findings, as above, similar prior studies.   Electronically Signed   By: DVinnie LangtonM.D.   On: 06/21/2013 19:32    Review of Systems  Constitutional: Negative for fever, chills, weight loss, malaise/fatigue and diaphoresis.  Respiratory: Negative for hemoptysis  and shortness of breath.   Cardiovascular: Negative for chest pain, palpitations and leg swelling.  Gastrointestinal: Positive for abdominal pain. Negative for nausea, vomiting, diarrhea, constipation, blood in stool and melena.  Genitourinary: Positive for hematuria. Negative for dysuria, urgency and flank pain.  Neurological: Negative for dizziness, tingling, speech change, focal weakness, seizures, loss of consciousness and weakness.   Blood  pressure 97/56, pulse 115, temperature 101.2 F (38.4 C), temperature source Oral, resp. rate 18, height _0  (1.549 m), weight 126 lb 9.6 oz (57.425 kg), SpO2 100.00%. Physical Exam  Constitutional: She appears well-developed and well-nourished. No distress.  HENT:  Head: Normocephalic and atraumatic.  Cardiovascular: Normal rate, regular rhythm, normal heart sounds and intact distal pulses.  Exam reveals no gallop and no friction rub.   No murmur heard. Respiratory: Effort normal and breath sounds normal. No respiratory distress. She has no wheezes. She has no rales. She exhibits no tenderness.  GI: Soft. Bowel sounds are normal. She exhibits no distension.  Moderate TTP periumbilical region without guarding or acute abdomen, no rebound tenderness, masses.  Ileostomy with liquid stool, flatus.    Musculoskeletal: Normal range of motion. She exhibits no edema and no tenderness.  Neurological: She is alert.  Skin: Skin is warm and dry. No rash noted. She is not diaphoretic. No erythema. No pallor.  Psychiatric: She has a normal mood and affect. Her behavior is normal. Judgment and thought content normal.    Assessment/Plan: End stage COPD Diabetes Mellitus Type 2 Hx Crohn's disease  S/p Exploratory laparotomy with LOA, parastomal hernia repair with insertion of biologic mesh Dr. Excell Seltzer 03/16/13 Hx colectomy around 2007 at outside facility(Crohn's)  PCM FUO Leukocytosis PSBO, recurrent, does not appear completely obstructed  UGI with SBFT 05/09/14 showed partial SBO at  transition point at mid pelvis.  She was able to tolerate liquids and was subsequently sent home on a full liquid diet and TPN.  Surgical intervention was not performed because of recent surgery.  She had been admitted once again for the same complaints.  She has been taking solids in recent weeks.  We recommend conservative treatment.  May hold off on NGT placement, place if she develops nausea or vomiting.  Recommend  resuming TPN, NPO, IV hydration, pain control.  Agree with fever work up.  There are no concerns for ischemic bowel, no free air on abdominal films.  We will monitor her clinically.  We will repeat her films in AM.  We will discuss with Dr. Excell Seltzer as she is not the ideal surgical candidate.   Tanaysia Bhardwaj ANP-BC 06/22/2013, 3:49 PM

## 2013-06-22 NOTE — Progress Notes (Signed)
[facility]  Patient Status:   Active pt with [facility] up to time of this readmission  [facility] is providing the following services:   [facility] and [facility] services for home TPN.  [facility] team will follow pt and support transition to home when deemed appropriate.   If patient discharges after hours, please call [phone].   Sedalia Muta 06/22/2013, 3:14 PM

## 2013-06-22 NOTE — Progress Notes (Signed)
[facility] PROGRESS NOTE  [name] NAT:[id number] DOB: Dec 28, 1950 DOA: 06/21/2013 PCP: [name], MD  Assessment/Plan: #1 probable small bowel obstruction Patient had presented with nausea vomiting and abdominal pain. Patient with significant left lower quadrant abdominal pain and spiking fevers. KUB done this morning shows persistent dilatation of a small bowel loop in the left pelvis consistent with obstruction. Patient with history of multiple surgeries and abscess he is status post ileostomy and noted adhesions on CT scan done in the ED yesterday. Patient with some nausea today and states abdominal pain when drinking clear liquids. Will make patient n.p.o. Bowel rest. IV fluids. Continue TPN. Keep potassium greater than 4 and magnesium greater than 2. Will consult with general surgery for further evaluation and management. Follow.  #2 fever/leukocytosis Questionable etiology. May be reactive secondary to problem #1. Will check blood cultures x2. Check a UA with cultures and sensitivities. Check a chest x-ray. Will follow for now. No need for any antibiotics at this time.  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 Continue Pulmicort and brovana, Spiriva.  #4 gastroesophageal reflux disease Continue Pepcid, PPI  #5 malnutrition On TPN. Hold pro-stat until problem #1 is resolved.  #6 depression Continue Celexa.  #7 hypertension Blood pressure is borderline. Will discontinue Lasix.  #8 type 2 diabetes Stable. Decrease victoza to half home dose. Sliding scale insulin.  #9 history of rheumatoid arthritis Stable. Hold Plaquenil. Outpatient followup.  #10 prophylaxis PPI for GI prophylaxis. Lovenox for DVT prophylaxis.   Code Status: Full Family Communication: Updated patient no family at bedside. Disposition Plan: Home when medically stable.   Consultants:  None  Procedures:  CT abdomen and pelvis 06/21/2013  KUB  06/22/2013    Antibiotics:  None  HPI/Subjective: Patient c/o nausea when with liquids. No emesis.  Objective: Filed Vitals:   06/22/13 1353  BP: 97/56  Pulse: 115  Temp: 101.2 F (38.4 C)  Resp: 18    Intake/Output Summary (Last 24 hours) at 06/22/13 1408 Last data filed at 06/22/13 0900  Gross per 24 hour  Intake    120 ml  Output    400 ml  Net   -280 ml   Filed Weights   06/22/13 0538  Weight: 57.425 kg (126 lb 9.6 oz)    Exam:   General:  nad  Cardiovascular: rrr  Respiratory: soft/NT/ND/+BS  Abdomen: Soft/exquisetely TTP in LLQ/+decreased BS. Ileostomy bag with loose stool.  Musculoskeletal: No c/c/e  Data Reviewed: Basic Metabolic Panel:  Recent Labs Lab 06/21/13 1522 06/22/13 0805 06/22/13 1231  NA 143 141  --   K 4.6 4.6  --   CL 104 106  --   CO2 29 24  --   GLUCOSE 79 96  --   BUN 25* 20  --   CREATININE 0.78 0.95  --   CALCIUM 8.8 8.3*  --   MG  --   --  1.7  PHOS  --   --  3.4   Liver Function Tests:  Recent Labs Lab 06/21/13 1522  AST 23  ALT 82*  ALKPHOS 182*  BILITOT 0.2*  PROT 6.8  ALBUMIN 3.1*    Recent Labs Lab 06/21/13 1522  LIPASE 29   No results found for this basename: AMMONIA,  in the last 168 hours CBC:  Recent Labs Lab 06/21/13 1522 06/22/13 0805  WBC 13.5* 11.8*  NEUTROABS 9.7*  --   HGB 10.6* 9.4*  HCT 32.4* 30.0*  MCV 88.5 90.9  PLT 224 213  Cardiac Enzymes: No results found for this basename: CKTOTAL, CKMB, CKMBINDEX, TROPONINI,  in the last 168 hours BNP (last 3 results) No results found for this basename: PROBNP,  in the last 8760 hours CBG: No results found for this basename: GLUCAP,  in the last 168 hours  Recent Results (from the past 240 hour(s))  URINE CULTURE     Status: None   Collection Time    06/21/13  5:09 PM      Result Value Range Status   Specimen Description URINE, CLEAN CATCH   Final   Special Requests NONE   Final   Culture  Setup Time     Final   Value:  06/21/2013 18:15     Performed at [facility] Count PENDING   Incomplete   Culture     Final   Value: Culture reincubated for better growth     Performed at [facility]   Report Status PENDING   Incomplete     Studies: Abd 1 View (kub)  06/22/2013   CLINICAL DATA:  Followup small bowel obstruction  EXAM: ABDOMEN - 1 VIEW  COMPARISON:  CT abdomen and pelvis 06/21/2013  FINDINGS: Right lower quadrant ostomy.  Excreted contrast material within bladder and minimally in right renal collecting system.  Single dilated loop of small bowel in the left pelvis consistent with small bowel obstruction.  No definite bowel wall thickening seen.  Endovascular coils noted right paraspinal at L1-L2.  Osseous structures unremarkable.  IMPRESSION: Persistent dilatation of a small bowel loop in the left pelvis consistent with obstruction.   Electronically Signed   By: Ulyses Southward M.D.   On: 06/22/2013 08:30   Ct Abdomen Pelvis W Contrast  06/21/2013   CLINICAL DATA:  Upper abdominal pain.  EXAM: CT ABDOMEN AND PELVIS WITH CONTRAST  TECHNIQUE: Multidetector CT imaging of the abdomen and pelvis was performed using the standard protocol following bolus administration of intravenous contrast.  CONTRAST:  OMNIPAQUE IOHEXOL 300 MG/ML  SOLN  COMPARISON:  CT of the abdomen and pelvis 05/06/2013.  FINDINGS: Lung Bases: Unremarkable.  Abdomen/Pelvis: The appearance of the liver, gallbladder, pancreas, spleen, bilateral adrenal glands and right kidney is unremarkable. Several sub cm low-attenuation lesions are noted in the left kidney, similar to the prior study; although too small to definitively characterize, these are favored to represent tiny cysts. High density material posterior to the pre-pyloric gastric antrum, presumably embolization coils.  Postoperative changes of total colectomy with right lower quadrant ileostomy and Hartmann's pouch are again noted. There are several areas acute  angulation of small bowel, best demonstrated on image 55 of series 2 adjacent to the dilated loop, where there is an acutely angulated loop of bowel, presumably related to underlying adhesions. There is a small amount a haziness in the small bowel mesentery. Trace volume of ascites. No pneumoperitoneum. No definite lymphadenopathy identified within the abdomen or pelvis. Status post hysterectomy. Previously noted lesion associated with the vaginal cuff is less cystic in appearance and smaller, currently measuring 2.7 x 1.6 cm (image 60 of series 2), presumably some involuting postoperative scar tissue. This does appear intimately associated with multiple adjacent loops of small bowel, suggesting the presence of adhesions adjacent to the vaginal cuff. Urinary bladder is unremarkable in appearance.  Musculoskeletal: There are no aggressive appearing lytic or blastic lesions noted in the visualized portions of the skeleton.  IMPRESSION: 1. 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 There is fecalization of small bowel contents in this region. Despite the presence of dilated loops of small bowel, the more proximal portion of the small bowel does not appear dilated, suggesting against frank obstruction at this time. 2. Small amount of edema in the small bowel mesentery and trace volume of ascites may suggest some active inflammation in this patient with history of Crohn's disease. 3. No pneumoperitoneum. 4. Additional incidental findings, as above, similar prior studies.   Electronically Signed   By: Trudie Reed M.D.   On: 06/21/2013 19:32    Scheduled Meds: . [COMPLETED] sodium chloride   Intravenous STAT  . arformoterol  15 mcg Nebulization BID  . aspirin EC  81 mg Oral Q M,W,F  . atorvastatin  10 mg Oral q1800  . budesonide  0.25 mg Nebulization BID  . celecoxib  200 mg Oral  Daily  . cholecalciferol  1,000 Units Oral Daily  . citalopram  20 mg Oral Daily  . diltiazem  180 mg Oral Daily  . enoxaparin (LOVENOX) injection  40 mg Subcutaneous Daily  . famotidine  20 mg Oral BID  . feeding supplement (PRO-STAT SUGAR FREE 64)  30 mL Oral Daily  . fluticasone  2 spray Each Nare Daily  . furosemide  20 mg Oral BID  . guaiFENesin  1,200 mg Oral BID  . hydroxychloroquine  200 mg Oral BID  . insulin aspart  0-9 Units Subcutaneous Q8H  . Liraglutide  1.8 mg Subcutaneous Daily  . magnesium sulfate 1 - 4 g bolus IVPB  2 g Intravenous Once  . pantoprazole  40 mg Oral Daily  . sodium chloride  10-40 mL Intracatheter Q12H  . tiotropium  18 mcg Inhalation Daily   Continuous Infusions: . sodium chloride    . Marland KitchenTPN (CLINIMIX-E) Adult     And  . fat emulsion      Principal Problem:   SBO (small bowel obstruction) Active Problems:   DIABETES, TYPE 2   HYPERLIPIDEMIA   GERD   CROHN'S DISEASE   Anxiety disorder   Severe protein-calorie malnutrition   Failure to thrive in adult   Abdominal pain   Fever, unspecified    Time spent: 35 mins    [facility] MD [facility] Pager [phone]. If 7PM-7AM, please contact night-coverage at [email or web address], password [facility] 06/22/2013, 2:08 PM  LOS: 1 day

## 2013-06-23 ENCOUNTER — Inpatient Hospital Stay (HOSPITAL_COMMUNITY): Payer: Medicare Other

## 2013-06-23 LAB — URINE CULTURE: Colony Count: >=100000

## 2013-06-23 LAB — GLUCOSE, CAPILLARY
GLUCOSE-CAPILLARY: 106 mg/dL — AB (ref 70–99)
GLUCOSE-CAPILLARY: 110 mg/dL — AB (ref 70–99)
GLUCOSE-CAPILLARY: 81 mg/dL (ref 70–99)
GLUCOSE-CAPILLARY: 144 mg/dL — AB (ref 70–99)
Glucose-Capillary: 126 mg/dL — ABNORMAL HIGH (ref 70–99)

## 2013-06-23 LAB — PREALBUMIN: Prealbumin: 12.3 mg/dL — ABNORMAL LOW (ref 17.0–34.0)

## 2013-06-23 LAB — CBC
HEMATOCRIT: 24.6 % — AB (ref 36.0–46.0)
Hemoglobin: 7.9 g/dL — ABNORMAL LOW (ref 12.0–15.0)
MCH: 28.7 pg (ref 26.0–34.0)
MCHC: 32.1 g/dL (ref 30.0–36.0)
MCV: 89.5 fL (ref 78.0–100.0)
PLATELETS: 150 10*3/uL (ref 150–400)
RBC: 2.75 MIL/uL — ABNORMAL LOW (ref 3.87–5.11)
RDW: 14.2 % (ref 11.5–15.5)
WBC: 13.2 10*3/uL — ABNORMAL HIGH (ref 4.0–10.5)

## 2013-06-23 LAB — DIFFERENTIAL
BASOS ABS: 0 10*3/uL (ref 0.0–0.1)
BASOS PCT: 0 % (ref 0–1)
EOS PCT: 4 % (ref 0–5)
Eosinophils Absolute: 0.6 10*3/uL (ref 0.0–0.7)
Lymphocytes Relative: 10 % — ABNORMAL LOW (ref 12–46)
Lymphs Abs: 1.4 10*3/uL (ref 0.7–4.0)
Monocytes Absolute: 1.2 10*3/uL — ABNORMAL HIGH (ref 0.1–1.0)
Monocytes Relative: 9 % (ref 3–12)
Neutro Abs: 10.1 10*3/uL — ABNORMAL HIGH (ref 1.7–7.7)
Neutrophils Relative %: 76 % (ref 43–77)

## 2013-06-23 LAB — COMPREHENSIVE METABOLIC PANEL
ALT: 44 U/L — AB (ref 0–35)
AST: 17 U/L (ref 0–37)
Albumin: 2.3 g/dL — ABNORMAL LOW (ref 3.5–5.2)
Alkaline Phosphatase: 116 U/L (ref 39–117)
BUN: 23 mg/dL (ref 6–23)
CALCIUM: 7.7 mg/dL — AB (ref 8.4–10.5)
CO2: 24 mEq/L (ref 19–32)
CREATININE: 0.95 mg/dL (ref 0.50–1.10)
Chloride: 106 mEq/L (ref 96–112)
GFR calc Af Amer: 72 mL/min — ABNORMAL LOW (ref >90)
GFR calc non Af Amer: 62 mL/min — ABNORMAL LOW (ref >90)
Glucose, Bld: 109 mg/dL — ABNORMAL HIGH (ref 70–99)
Potassium: 4.3 mEq/L (ref 3.7–5.3)
SODIUM: 139 meq/L (ref 137–147)
Total Bilirubin: 0.3 mg/dL (ref 0.3–1.2)
Total Protein: 5.3 g/dL — ABNORMAL LOW (ref 6.0–8.3)

## 2013-06-23 LAB — MAGNESIUM: Magnesium: 1.9 mg/dL (ref 1.5–2.5)

## 2013-06-23 LAB — PHOSPHORUS: Phosphorus: 4.3 mg/dL (ref 2.3–4.6)

## 2013-06-23 LAB — TRIGLYCERIDES: TRIGLYCERIDES: 39 mg/dL (ref <150)

## 2013-06-23 MED ORDER — FENTANYL CITRATE 0.05 MG/ML IJ SOLN
12.5000 ug | INTRAMUSCULAR | Status: DC | PRN
Start: 2013-06-23 — End: 2013-06-24
  Administered 2013-06-24: 25 ug via INTRAVENOUS
  Filled 2013-06-23: qty 2

## 2013-06-23 MED ORDER — CLINIMIX E/DEXTROSE (5/15) 5 % IV SOLN
INTRAVENOUS | Status: AC
  Administered 2013-06-23: 19:00:00 via INTRAVENOUS
  Filled 2013-06-23: qty 2000

## 2013-06-23 MED ORDER — FAT EMULSION 20 % IV EMUL
250.0000 mL | INTRAVENOUS | Status: AC
  Administered 2013-06-23: 250 mL via INTRAVENOUS
  Filled 2013-06-23: qty 250

## 2013-06-23 NOTE — Progress Notes (Signed)
[facility] PROGRESS NOTE  [name] YQM:[id number] DOB: [birth year] DOA: 06/21/2013 PCP: [name], MD  Assessment/Plan: #1 probable small bowel obstruction Patient had presented with nausea, vomiting and abdominal pain. Patient with significant left lower quadrant abdominal pain and spiking fevers. KUB done this morning shows improvement. Ileostomy with good output. Patient with no emesis or nausea. Abdominal pain improved. Continue TPN. Keep potassium greater than 4 and magnesium greater than 2. CCS ff. Patient started on clears. CCS ff and appreciate input and rxcs.  #2 fever/leukocytosis Questionable etiology. May be reactive secondary to problem #1. Afebrile x 24hrs. Blood cultures x2 pending. UA is negative. with cultures and sensitivities. Chest x-ray negative for infiltrate. Will follow for now. No need for any antibiotics at this time.  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 Continue Pulmicort and brovana, Spiriva.  #4 gastroesophageal reflux disease Continue Pepcid, PPI  #5 malnutrition On TPN. Hold pro-stat until problem #1 is resolved.  #6 depression Continue Celexa.  #7 hypertension Blood pressure is borderline. Will discontinue Lasix. Discontinue Cardizem. Morphine has been discontinued as per general surgery caused a drop in patient's blood pressure during last hospitalization. Follow.  #8 type 2 diabetes Stable. D/c victoza. Sliding scale insulin.  #9 history of rheumatoid arthritis Stable. Hold Plaquenil. Outpatient followup.  #10 prophylaxis PPI for GI prophylaxis. Lovenox for DVT prophylaxis.   Code Status: Full Family Communication: Updated patient no family at bedside. Disposition Plan: Home when medically stable.   Consultants:  None  Procedures:  CT abdomen and pelvis 06/21/2013  KUB 06/22/2013, 06/23/2013  Antibiotics:  None  HPI/Subjective: Patient states abdominal pain has improved. Patient with some  ileostomy output. Patient stated tried some liquid had some abdominal pain but not as severe as on admission. No emesis.  Objective: Filed Vitals:   06/23/13 0816  BP: 85/44  Pulse: 91  Temp: 99.2 F (37.3 C)  Resp: 16    Intake/Output Summary (Last 24 hours) at 06/23/13 1055 Last data filed at 06/22/13 2258  Gross per 24 hour  Intake   1965 ml  Output    700 ml  Net   1265 ml   Filed Weights   06/22/13 0538  Weight: 57.425 kg (126 lb 9.6 oz)    Exam:   General:  nad  Cardiovascular: rrr  Respiratory: soft/NT/ND/+BS  Abdomen: Soft/decreased TTP in LLQ/+decreased BS. Ileostomy bag with loose stool.  Musculoskeletal: No c/c/e  Data Reviewed: Basic Metabolic Panel:  Recent Labs Lab 06/21/13 1522 06/22/13 0805 06/22/13 1231 06/23/13 0600  NA 143 141  --  139  K 4.6 4.6  --  4.3  CL 104 106  --  106  CO2 29 24  --  24  GLUCOSE 79 96  --  109*  BUN 25* 20  --  23  CREATININE 0.78 0.95  --  0.95  CALCIUM 8.8 8.3*  --  7.7*  MG  --   --  1.7 1.9  PHOS  --   --  3.4 4.3   Liver Function Tests:  Recent Labs Lab 06/21/13 1522 06/23/13 0600  AST 23 17  ALT 82* 44*  ALKPHOS 182* 116  BILITOT 0.2* 0.3  PROT 6.8 5.3*  ALBUMIN 3.1* 2.3*    Recent Labs Lab 06/21/13 1522  LIPASE 29   No results found for this basename: AMMONIA,  in the last 168 hours CBC:  Recent Labs Lab 06/21/13 1522 06/22/13 0805 06/23/13 0600  WBC 13.5* 11.8* 13.2*  NEUTROABS 9.7*  --  10.1*  HGB 10.6* 9.4* 7.9*  HCT 32.4* 30.0* 24.6*  MCV 88.5 90.9 89.5  PLT 224 213 150   Cardiac Enzymes: No results found for this basename: CKTOTAL, CKMB, CKMBINDEX, TROPONINI,  in the last 168 hours BNP (last 3 results) No results found for this basename: PROBNP,  in the last 8760 hours CBG:  Recent Labs Lab 06/22/13 2132 06/23/13 0700  GLUCAP 181* 106*    Recent Results (from the past 240 hour(s))  URINE CULTURE     Status: None   Collection Time    06/21/13  5:09 PM       Result Value Range Status   Specimen Description URINE, CLEAN CATCH   Final   Special Requests NONE   Final   Culture  Setup Time     Final   Value: 06/21/2013 18:15     Performed at [facility] Count     Final   Value: >=100,000 COLONIES/ML     Performed at [facility]   Culture     Final   Value: Multiple bacterial morphotypes present, none predominant. Suggest appropriate recollection if clinically indicated.     Performed at [facility]   Report Status 06/23/2013 FINAL   Final  CULTURE, BLOOD (ROUTINE X 2)     Status: None   Collection Time    06/22/13  4:00 PM      Result Value Range Status   Specimen Description BLOOD LEFT ARM   Final   Special Requests BOTTLES DRAWN AEROBIC ONLY 10CC   Final   Culture  Setup Time     Final   Value: 06/22/2013 22:50     Performed at [facility]   Culture     Final   Value:        BLOOD CULTURE RECEIVED NO GROWTH TO DATE CULTURE WILL BE HELD FOR 5 DAYS BEFORE ISSUING A FINAL NEGATIVE REPORT     Performed at [facility]   Report Status PENDING   Incomplete  CULTURE, BLOOD (ROUTINE X 2)     Status: None   Collection Time    06/22/13  4:10 PM      Result Value Range Status   Specimen Description BLOOD LEFT HAND   Final   Special Requests BOTTLES DRAWN AEROBIC ONLY 10CC   Final   Culture  Setup Time     Final   Value: 06/22/2013 22:49     Performed at [facility]   Culture     Final   Value:        BLOOD CULTURE RECEIVED NO GROWTH TO DATE CULTURE WILL BE HELD FOR 5 DAYS BEFORE ISSUING A FINAL NEGATIVE REPORT     Performed at [facility]   Report Status PENDING   Incomplete     Studies: Abd 1 View (kub)  06/22/2013   CLINICAL DATA:  Followup small bowel obstruction  EXAM: ABDOMEN - 1 VIEW  COMPARISON:  CT abdomen and pelvis 06/21/2013  FINDINGS: Right lower quadrant ostomy.  Excreted contrast material within bladder and minimally in right renal collecting system.   Single dilated loop of small bowel in the left pelvis consistent with small bowel obstruction.  No definite bowel wall thickening seen.  Endovascular coils noted right paraspinal at L1-L2.  Osseous structures unremarkable.  IMPRESSION: Persistent dilatation of a small bowel loop in the left pelvis consistent with obstruction.   Electronically Signed   By: Loraine Leriche  Tyron Russell M.D.   On: 06/22/2013 08:30   Ct Abdomen Pelvis W Contrast  06/21/2013   CLINICAL DATA:  Upper abdominal pain.  EXAM: CT ABDOMEN AND PELVIS WITH CONTRAST  TECHNIQUE: Multidetector CT imaging of the abdomen and pelvis was performed using the standard protocol following bolus administration of intravenous contrast.  CONTRAST:  OMNIPAQUE IOHEXOL 300 MG/ML  SOLN  COMPARISON:  CT of the abdomen and pelvis 05/06/2013.  FINDINGS: Lung Bases: Unremarkable.  Abdomen/Pelvis: The appearance of the liver, gallbladder, pancreas, spleen, bilateral adrenal glands and right kidney is unremarkable. Several sub cm low-attenuation lesions are noted in the left kidney, similar to the prior study; although too small to definitively characterize, these are favored to represent tiny cysts. High density material posterior to the pre-pyloric gastric antrum, presumably embolization coils.  Postoperative changes of total colectomy with right lower quadrant ileostomy and Hartmann's pouch are again noted. There are several areas acute angulation of small bowel, best demonstrated on image 55 of series 2 adjacent to the dilated loop, where there is an acutely angulated loop of bowel, presumably related to underlying adhesions. There is a small amount a haziness in the small bowel mesentery. Trace volume of ascites. No pneumoperitoneum. No definite lymphadenopathy identified within the abdomen or pelvis. Status post hysterectomy. Previously noted lesion associated with the vaginal cuff is less cystic in appearance and smaller, currently measuring 2.7 x 1.6 cm (image 60 of  series 2), presumably some involuting postoperative scar tissue. This does appear intimately associated with multiple adjacent loops of small bowel, suggesting the presence of adhesions adjacent to the vaginal cuff. Urinary bladder is unremarkable in appearance.  Musculoskeletal: There are no aggressive appearing lytic or blastic lesions noted in the visualized portions of the skeleton.  IMPRESSION: 1. 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 There is fecalization of small bowel contents in this region. Despite the presence of dilated loops of small bowel, the more proximal portion of the small bowel does not appear dilated, suggesting against frank obstruction at this time. 2. Small amount of edema in the small bowel mesentery and trace volume of ascites may suggest some active inflammation in this patient with history of Crohn's disease. 3. No pneumoperitoneum. 4. Additional incidental findings, as above, similar prior studies.   Electronically Signed   By: Trudie Reed M.D.   On: 06/21/2013 19:32   Dg Chest Port 1 View  06/22/2013   CLINICAL DATA:  Chest pain and dyspnea.  EXAM: PORTABLE CHEST - 1 VIEW  COMPARISON:  DG CHEST 1V PORT dated 05/07/2013  FINDINGS: DG CHEST 1V PORT dated 12/13/2014The lungs are hyperinflated. The interstitial markings are increased bilaterally. The cardiac silhouette is not enlarged. The pulmonary vascularity is mildly prominent centrally. There is no pleural effusion or pneumothorax. The right subclavian venous catheter tip lies in the region of the midportion of the SVC. There is severe degenerative change of the right shoulder which is stable.  IMPRESSION: 1. Increased interstitial markings bilaterally have developed that suggests interstitial edema. This may be of cardiac or noncardiac cause. There is no focal pneumonia. 2. Hyperinflation is  stable consistent with COPD.   Electronically Signed   By: David  [place]   On: 06/22/2013 17:16   Dg Abd 2 Views  06/23/2013   CLINICAL DATA:  Mid abdominal pain, small bowel obstruction  EXAM: ABDOMEN - 2 VIEW  COMPARISON:  Abdominal  radiograph dated 06/22/2013. CT abdomen pelvis dated 06/21/2013.  FINDINGS: Nonobstructive bowel gas pattern. Prior prominent small bowel loop in the left mid abdomen is not well visualized on the current study. Right lower quadrant ostomy. Surgical sutures overlying the pelvis.  Embolization coils overlying the mid abdomen.  No evidence of free air under the diaphragm on the upright view.  IMPRESSION: No findings to suggest small bowel obstruction.  Right lower quadrant ostomy.  No free air.   Electronically Signed   By: Charline Bills M.D.   On: 06/23/2013 08:24    Scheduled Meds: . arformoterol  15 mcg Nebulization BID  . aspirin EC  81 mg Oral Q M,W,F  . budesonide  0.25 mg Nebulization BID  . citalopram  20 mg Oral Daily  . enoxaparin (LOVENOX) injection  40 mg Subcutaneous Daily  . famotidine (PEPCID) IV  20 mg Intravenous Q12H  . feeding supplement (PRO-STAT SUGAR FREE 64)  30 mL Oral Daily  . fluticasone  2 spray Each Nare Daily  . insulin aspart  0-9 Units Subcutaneous Q8H  . pantoprazole (PROTONIX) IV  40 mg Intravenous Q24H  . sodium chloride  10-40 mL Intracatheter Q12H  . tiotropium  18 mcg Inhalation Daily   Continuous Infusions: . sodium chloride 1,000 mL (06/22/13 2309)  . Marland KitchenTPN (CLINIMIX-E) Adult     And  . fat emulsion      Principal Problem:   SBO (small bowel obstruction) Active Problems:   DIABETES, TYPE 2   HYPERLIPIDEMIA   GERD   CROHN'S DISEASE   Anxiety disorder   Severe protein-calorie malnutrition   Failure to thrive in adult   Abdominal pain   Fever, unspecified   Incarcerated right inguinal hernia    Time spent: 35 mins    [facility] MD [facility] Pager [phone]. If 7PM-7AM, please contact  night-coverage at [email or web address], password [facility] 06/23/2013, 10:55 AM  LOS: 2 days

## 2013-06-23 NOTE — Progress Notes (Signed)
Patient has been active with [facility] Care Management services for COPD management. [facility] Community Care Coordinator aware of patient's admission. Patient will continue to be followed by [facility] Care Management. She will receive post hospital discharge call and will be evaluated for monthly home visits. Will make inpatient RNCM aware [facility] Care Management active. Will follow up with patient at bedside later today. Of note, [facility] Care Management does not interfere or replace services provided by home health. She has been active with [facility] in the past.  [name], [facility] [phone]

## 2013-06-23 NOTE — Progress Notes (Signed)
PARENTERAL NUTRITION CONSULT NOTE - Follow Up  Pharmacy Consult for TPN Indication: Home TPN for long history of Crohn's disease, recent small bowel obstruction and pelvic abscess and subsequent partial small bowel obstruction   Allergies  Allergen Reactions  . Esomeprazole Magnesium Other (See Comments)    NEXIUM - reaction > aggravated pt's Crohn's disease  . Shellfish Allergy Shortness Of Breath and Nausea And Vomiting  . Surgical Lubricant Other (See Comments)    Burns skin   . Other Nausea And Vomiting    Beans, Dander/Dust, Peas, Mushrooms  . Peanut-Containing Drug Products Nausea And Vomiting  . Levaquin [Levofloxacin]     Patient Measurements: Height: 5' 1"  (154.9 cm) Weight: 126 lb 9.6 oz (57.425 kg) IBW/kg (Calculated) : 47.8 Usual Weight: 57 kg  Vital Signs: Temp: 99.2 F (37.3 C) (01/29 0816) Temp src: Oral (01/29 0816) BP: 85/44 mmHg (01/29 0816) Pulse Rate: 91 (01/29 0816) Intake/Output from previous day: 01/28 0701 - 01/29 0700 In: 2085 [P.O.:120; I.V.:1890.8; IV Piggyback:50; TPN:24.2] Out: 700 [Urine:250; Stool:450] Intake/Output from this shift:    Labs:  Recent Labs  06/21/13 1522 06/22/13 0805 06/23/13 0600  WBC 13.5* 11.8* 13.2*  HGB 10.6* 9.4* 7.9*  HCT 32.4* 30.0* 24.6*  PLT 224 213 150     Recent Labs  06/21/13 1522 06/22/13 0805 06/22/13 1231 06/23/13 0600  NA 143 141  --  139  K 4.6 4.6  --  4.3  CL 104 106  --  106  CO2 29 24  --  24  GLUCOSE 79 96  --  109*  BUN 25* 20  --  23  CREATININE 0.78 0.95  --  0.95  CALCIUM 8.8 8.3*  --  7.7*  MG  --   --  1.7 1.9  PHOS  --   --  3.4 4.3  PROT 6.8  --   --  5.3*  ALBUMIN 3.1*  --   --  2.3*  AST 23  --   --  17  ALT 82*  --   --  44*  ALKPHOS 182*  --   --  116  BILITOT 0.2*  --   --  0.3  TRIG  --   --   --  39   Estimated Creatinine Clearance: 49.4 ml/min (by C-G formula based on Cr of 0.95).    Recent Labs  06/22/13 2132 06/23/13 0700  GLUCAP 181* 106*     Medical History: Past Medical History  Diagnosis Date  . Other diseases of vocal cords   . Osteoporosis, unspecified   . Other and unspecified hyperlipidemia   . Esophageal reflux   . Chronic airway obstruction, not elsewhere classified   . Regional enteritis of unspecified site   . 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 Crohn's disease     Ileostomy  . Pelvic adhesions   . Anxiety   . Depression   . Hypertension   . Emphysema   . Hematuria   . Severe cervical dysplasia age 54    cone biopsy with neg paps since  . Pneumonia     "3-4 times; necrotizing once" (04/19/2013)  . Asthmatic bronchitis , chronic   . Exertional shortness of breath   . On home oxygen therapy     "24/7; 2.5L @ rest; 3L when I'm doing my chores" (04/19/2013)  . Type II diabetes mellitus   . Anemia   . History of blood transfusion     "I've had 3 or  4" (04/19/2013)  . Rheumatoid arthritis(714.0) 03/2012    [place]; "mild in my hands" (04/19/2013)  . Small bowel obstruction due to adhesions 2014    [place]  . Pelvic abscess in female 2014    Medications:  Prescriptions prior to admission  Medication Sig Dispense Refill  . Amino Acids-Protein Hydrolys (FEEDING SUPPLEMENT, PRO-STAT SUGAR FREE 64,) LIQD Take 30 mLs by mouth daily.  900 mL  0  . arformoterol (BROVANA) 15 MCG/2ML NEBU Take 15 mcg by nebulization 2 (two) times daily.      [name] aspirin 81 MG tablet Take 81 mg by mouth every Monday, Wednesday, and Friday.       . budesonide (PULMICORT) 0.25 MG/2ML nebulizer solution Take 0.25 mg by nebulization 2 (two) times daily.      . calcium-vitamin D (OSCAL WITH D) 500-200 MG-UNIT per tablet Take 1 tablet by mouth 2 (two) times daily.      . celecoxib (CELEBREX) 200 MG capsule Take 200 mg by mouth daily.      . Cholecalciferol (VITAMIN D) 1000 UNITS capsule Take 1,000 Units by mouth daily.       . citalopram (CELEXA) 20 MG tablet Take 20 mg by mouth daily.       [name] diltiazem  (TIAZAC) 180 MG 24 hr capsule Take 180 mg by mouth 2 (two) times daily.      . famotidine (PEPCID) 20 MG tablet Take 20 mg by mouth 2 (two) times daily.      . fluticasone (FLONASE) 50 MCG/ACT nasal spray Place 2 sprays into the nose daily as needed.       . furosemide (LASIX) 20 MG tablet Take 20 mg by mouth 2 (two) times daily. Once or twice daily as needed for swelling      . guaiFENesin (MUCINEX) 600 MG 12 hr tablet Take 1,200 mg by mouth 2 (two) times daily.      [name] HYDROcodone-acetaminophen (NORCO/VICODIN) 5-325 MG per tablet Take 1-2 tablets by mouth every 6 (six) hours as needed for moderate pain or severe pain.  40 tablet  0  . hydroxychloroquine (PLAQUENIL) 200 MG tablet Take 200 mg by mouth Twice daily.       . IRON PO Take 1 tablet by mouth every Monday, Wednesday, and Friday.       . Liraglutide (VICTOZA) 18 MG/3ML SOLN Inject 1.8 mg into the skin daily. 1.8 units once daily      . LORazepam (ATIVAN) 0.5 MG tablet Take 0.5 mg by mouth every 8 (eight) hours as needed for anxiety.      [name] omeprazole (PRILOSEC) 40 MG capsule Take 40 mg by mouth 2 (two) times daily.       . ondansetron (ZOFRAN) 4 MG tablet Take 1 tablet (4 mg total) by mouth every 6 (six) hours as needed for nausea.  20 tablet  0  . PROAIR HFA 108 (90 BASE) MCG/ACT inhaler Inhale 2 puffs into the lungs every 6 (six) hours as needed for wheezing or shortness of breath.       . rosuvastatin (CRESTOR) 5 MG tablet Take 5 mg by mouth every Monday, Wednesday, and Friday.       . tiotropium (SPIRIVA) 18 MCG inhalation capsule Place 18 mcg into inhaler and inhale daily.      . TPN ADULT Inject into the vein continuous. On at 9am off at 9pm   144.62m/hr  (17376mover 12 hours)      . zoledronic acid (RECLAST) 5 MG/100ML SOLN  injection Inject 5 mg into the vein once. Once a year in May      . Respiratory Therapy Supplies (FLUTTER) DEVI Inhale into the lungs. 2-4 times daily        Insulin Requirements in the past 24 hours:   None  Current Nutrition:  Clear liquids currently PTA: cyclic TPN and small amounts of soft/chopped diet (12 hr cyclic TPN at home provides 78 gm protein, 1467 kcal daily)  Nutritional Goals:  Kcal: 1550-1780; Protein: 70-85 grams (per RD note 1/28)  Assessment: 63 y.o. female presents with N/V x 1 day. Noted past medical history significant for Crohn's disease status post ileostomy, multiple abscesses and small bowel obstructions in the past and multiple surgeries for adhesional lysis and small bowel obstruction in addition to subtotal colectomy. Pt on cyclic TPN at home and small amounts of soft foods.  GI: Abd pain, N/V. CT scan not conclusive for SBO. Pt with h/o multiple bowel obstuctions. Clear liquid diet. PPI po and pepcid po (on both pepcid and omeprazole PTA)  Endo: DM 2. No insulin in home TPN. CBGs 106-181 (liraglutide PTA)  SSI 2 units given  Lytes: Mg 1.9, Na 139, K 4.3  Renal: SCr wnl at baseline. MIVF NS at 100 ml/hr  Pulm: 2L [place]. H/o obstructive chronic bronchitis; home O2. Arfomoterol, budesonide.  Cards: BP low, HR 90s. HTN. ASA 81, home meds statin, dilt, lasix not presently receiveing  Hepatobil: Alk phos, ALT elevated. TG 39  Neuro: anxiety, depression. citalopram  ID: Tm 99.2. Wbc 13.2  Best Practices: Enox 40  TPN Access: PICC (PTA)  Plan:  1. Cont cyclic clinimix E 3/81 over 12 hour cycle (86m/hr for 1 hr then 145 ml/hr for 10 hrs then 574mhr for 1 hr) and 20% fat emulsion at 16 ml/hr for 12 hours which will provide ~78 gm protein, 1485 kcal per day. (This most closely matches pt's home TPN formulation) 2. Empiric CBGs and sensitive SSI q8h  3. Daily MVI and trace elements 4. Will f/u clinical progress  LoExcell SeltzerPharmD Clinical pharmacist, #[phone]/29/2015,9:30 AM

## 2013-06-23 NOTE — Progress Notes (Signed)
Came to visit patient at bedside. She is active with [facility] Care Management. She is also active with [facility] for RN services. Resumption of care orders for hhc RN will be needed upon discharge. She will continue to be followed by [facility] Care Management upon discharge. She will receive post hospital discharge call and will be continue to be evaluated for monthly home visits. Appreciative of visit. [name], MSN, RN,BSN - [facility] [phone]

## 2013-06-23 NOTE — Progress Notes (Signed)
Patient ID: [name], female   DOB: 02/18/1951, 63 y.o.   MRN: [id number]   Subjective: Good ostomy output, no n/v.    Objective:  Vital signs:  Filed Vitals:   06/22/13 1954 06/22/13 2124 06/23/13 0648 06/23/13 0816  BP: 98/44  85/43 85/44  Pulse: 100  87 91  Temp: 99.1 F (37.3 C)  98.8 F (37.1 C) 99.2 F (37.3 C)  TempSrc: Oral  Oral Oral  Resp: 18  16 16   Height:      Weight:      SpO2: 100% 98% 100% 100%    Last BM Date: 06/22/13  Intake/Output   Yesterday:  01/28 0701 - 01/29 0700 In: 2085 [P.O.:120; I.V.:1890.8; IV Piggyback:50; TPN:24.2] Out: 700 [Urine:250; Stool:450]     Physical Exam  Constitutional: She appears well-developed and well-nourished. No distress.  Cardiovascular: Normal rate, regular rhythm, normal heart sounds and intact distal pulses. Exam reveals no gallop and no friction rub. No murmur heard.  Respiratory: Effort normal and breath sounds normal. No respiratory distress. She has no wheezes. She has no rales.  GI: Soft. Bowel sounds are normal. She exhibits no distension.  Mild TTP periumbilical region without guarding or acute abdomen, no rebound tenderness, masses. Ileostomy with liquid stool, flatus.  Neurological: She is alert.   Problem List:   Principal Problem:   SBO (small bowel obstruction) Active Problems:   DIABETES, TYPE 2   HYPERLIPIDEMIA   GERD   CROHN'S DISEASE   Anxiety disorder   Severe protein-calorie malnutrition   Failure to thrive in adult   Abdominal pain   Fever, unspecified   Incarcerated right inguinal hernia    Results:   Labs: Results for orders placed during the hospital encounter of 06/21/13 (from the past 48 hour(s))  COMPREHENSIVE METABOLIC PANEL     Status: Abnormal   Collection Time    06/21/13  3:22 PM      Result Value Range   Sodium 143  137 - 147 mEq/L   Potassium 4.6  3.7 - 5.3 mEq/L   Chloride 104  96 - 112 mEq/L   CO2 29  19 - 32 mEq/L   Glucose, Bld 79  70 - 99 mg/dL   BUN 25 (*) 6 - 23 mg/dL   Creatinine, Ser 0.78  0.50 - 1.10 mg/dL   Calcium 8.8  8.4 - 10.5 mg/dL   Total Protein 6.8  6.0 - 8.3 g/dL   Albumin 3.1 (*) 3.5 - 5.2 g/dL   AST 23  0 - 37 U/L   ALT 82 (*) 0 - 35 U/L   Alkaline Phosphatase 182 (*) 39 - 117 U/L   Total Bilirubin 0.2 (*) 0.3 - 1.2 mg/dL   GFR calc non Af Amer 87 (*) >90 mL/min   GFR calc Af Amer >90  >90 mL/min   Comment: (NOTE)     The eGFR has been calculated using the CKD EPI equation.     This calculation has not been validated in all clinical situations.     eGFR's persistently <90 mL/min signify possible Chronic Kidney     Disease.  LIPASE, BLOOD     Status: None   Collection Time    06/21/13  3:22 PM      Result Value Range   Lipase 29  11 - 59 U/L  CBC WITH DIFFERENTIAL     Status: Abnormal   Collection Time    06/21/13  3:22 PM      Result Value  Range   WBC 13.5 (*) 4.0 - 10.5 K/uL   RBC 3.66 (*) 3.87 - 5.11 MIL/uL   Hemoglobin 10.6 (*) 12.0 - 15.0 g/dL   HCT 32.4 (*) 36.0 - 46.0 %   MCV 88.5  78.0 - 100.0 fL   MCH 29.0  26.0 - 34.0 pg   MCHC 32.7  30.0 - 36.0 g/dL   RDW 14.7  11.5 - 15.5 %   Platelets 224  150 - 400 K/uL   Neutrophils Relative % 72  43 - 77 %   Neutro Abs 9.7 (*) 1.7 - 7.7 K/uL   Lymphocytes Relative 14  12 - 46 %   Lymphs Abs 1.9  0.7 - 4.0 K/uL   Monocytes Relative 6  3 - 12 %   Monocytes Absolute 0.8  0.1 - 1.0 K/uL   Eosinophils Relative 8 (*) 0 - 5 %   Eosinophils Absolute 1.1 (*) 0.0 - 0.7 K/uL   Basophils Relative 0  0 - 1 %   Basophils Absolute 0.0  0.0 - 0.1 K/uL  LACTIC ACID, PLASMA     Status: None   Collection Time    06/21/13  3:44 PM      Result Value Range   Lactic Acid, Venous 0.8  0.5 - 2.2 mmol/L  URINALYSIS, ROUTINE W REFLEX MICROSCOPIC     Status: Abnormal   Collection Time    06/21/13  5:08 PM      Result Value Range   Color, Urine YELLOW  YELLOW   APPearance CLOUDY (*) CLEAR   Specific Gravity, Urine 1.024  1.005 - 1.030   pH 6.0  5.0 - 8.0   Glucose,  UA NEGATIVE  NEGATIVE mg/dL   Hgb urine dipstick MODERATE (*) NEGATIVE   Bilirubin Urine NEGATIVE  NEGATIVE   Ketones, ur NEGATIVE  NEGATIVE mg/dL   Protein, ur NEGATIVE  NEGATIVE mg/dL   Urobilinogen, UA 0.2  0.0 - 1.0 mg/dL   Nitrite NEGATIVE  NEGATIVE   Leukocytes, UA SMALL (*) NEGATIVE  URINE MICROSCOPIC-ADD ON     Status: Abnormal   Collection Time    06/21/13  5:08 PM      Result Value Range   Squamous Epithelial / LPF FEW (*) RARE   WBC, UA 0-2  <3 WBC/hpf   RBC / HPF 0-2  <3 RBC/hpf   Casts HYALINE CASTS (*) NEGATIVE  URINE CULTURE     Status: None   Collection Time    06/21/13  5:09 PM      Result Value Range   Specimen Description URINE, CLEAN CATCH     Special Requests NONE     Culture  Setup Time       Value: 06/21/2013 18:15     Performed at [facility]       Value: >=100,000 COLONIES/ML     Performed at [facility]   Culture       Value: Multiple bacterial morphotypes present, none predominant. Suggest appropriate recollection if clinically indicated.     Performed at [facility]   Report Status 06/23/2013 FINAL    CBC     Status: Abnormal   Collection Time    06/22/13  8:05 AM      Result Value Range   WBC 11.8 (*) 4.0 - 10.5 K/uL   RBC 3.30 (*) 3.87 - 5.11 MIL/uL   Hemoglobin 9.4 (*) 12.0 - 15.0 g/dL   HCT 30.0 (*) 36.0 -  46.0 %   MCV 90.9  78.0 - 100.0 fL   MCH 28.5  26.0 - 34.0 pg   MCHC 31.3  30.0 - 36.0 g/dL   RDW 14.7  11.5 - 15.5 %   Platelets 213  150 - 400 K/uL  BASIC METABOLIC PANEL     Status: Abnormal   Collection Time    06/22/13  8:05 AM      Result Value Range   Sodium 141  137 - 147 mEq/L   Potassium 4.6  3.7 - 5.3 mEq/L   Chloride 106  96 - 112 mEq/L   CO2 24  19 - 32 mEq/L   Glucose, Bld 96  70 - 99 mg/dL   BUN 20  6 - 23 mg/dL   Creatinine, Ser 0.95  0.50 - 1.10 mg/dL   Calcium 8.3 (*) 8.4 - 10.5 mg/dL   GFR calc non Af Amer 62 (*) >90 mL/min   GFR calc Af Amer 72 (*) >90 mL/min    Comment: (NOTE)     The eGFR has been calculated using the CKD EPI equation.     This calculation has not been validated in all clinical situations.     eGFR's persistently <90 mL/min signify possible Chronic Kidney     Disease.  MAGNESIUM     Status: None   Collection Time    06/22/13 12:31 PM      Result Value Range   Magnesium 1.7  1.5 - 2.5 mg/dL  PHOSPHORUS     Status: None   Collection Time    06/22/13 12:31 PM      Result Value Range   Phosphorus 3.4  2.3 - 4.6 mg/dL  URINALYSIS, ROUTINE W REFLEX MICROSCOPIC     Status: None   Collection Time    06/22/13  2:34 PM      Result Value Range   Color, Urine YELLOW  YELLOW   APPearance CLEAR  CLEAR   Specific Gravity, Urine 1.025  1.005 - 1.030   pH 5.5  5.0 - 8.0   Glucose, UA NEGATIVE  NEGATIVE mg/dL   Hgb urine dipstick NEGATIVE  NEGATIVE   Bilirubin Urine NEGATIVE  NEGATIVE   Ketones, ur NEGATIVE  NEGATIVE mg/dL   Protein, ur NEGATIVE  NEGATIVE mg/dL   Urobilinogen, UA 0.2  0.0 - 1.0 mg/dL   Nitrite NEGATIVE  NEGATIVE   Leukocytes, UA NEGATIVE  NEGATIVE   Comment: MICROSCOPIC NOT DONE ON URINES WITH NEGATIVE PROTEIN, BLOOD, LEUKOCYTES, NITRITE, OR GLUCOSE <1000 mg/dL.  CULTURE, BLOOD (ROUTINE X 2)     Status: None   Collection Time    06/22/13  4:00 PM      Result Value Range   Specimen Description BLOOD LEFT ARM     Special Requests BOTTLES DRAWN AEROBIC ONLY 10CC     Culture  Setup Time       Value: 06/22/2013 22:50     Performed at [facility]   Culture       Value:        BLOOD CULTURE RECEIVED NO GROWTH TO DATE CULTURE WILL BE HELD FOR 5 DAYS BEFORE ISSUING A FINAL NEGATIVE REPORT     Performed at [facility]   Report Status PENDING    CULTURE, BLOOD (ROUTINE X 2)     Status: None   Collection Time    06/22/13  4:10 PM      Result Value Range   Specimen Description BLOOD LEFT HAND  Special Requests BOTTLES DRAWN AEROBIC ONLY 10CC     Culture  Setup Time       Value: 06/22/2013 22:49      Performed at [facility]   Culture       Value:        BLOOD CULTURE RECEIVED NO GROWTH TO DATE CULTURE WILL BE HELD FOR 5 DAYS BEFORE ISSUING A FINAL NEGATIVE REPORT     Performed at [facility]   Report Status PENDING    GLUCOSE, CAPILLARY     Status: Abnormal   Collection Time    06/22/13  9:32 PM      Result Value Range   Glucose-Capillary 181 (*) 70 - 99 mg/dL  TRIGLYCERIDES     Status: None   Collection Time    06/23/13  6:00 AM      Result Value Range   Triglycerides 39  <150 mg/dL  CBC     Status: Abnormal   Collection Time    06/23/13  6:00 AM      Result Value Range   WBC 13.2 (*) 4.0 - 10.5 K/uL   RBC 2.75 (*) 3.87 - 5.11 MIL/uL   Hemoglobin 7.9 (*) 12.0 - 15.0 g/dL   HCT 24.6 (*) 36.0 - 46.0 %   MCV 89.5  78.0 - 100.0 fL   MCH 28.7  26.0 - 34.0 pg   MCHC 32.1  30.0 - 36.0 g/dL   RDW 14.2  11.5 - 15.5 %   Platelets 150  150 - 400 K/uL   Comment: REPEATED TO VERIFY     DELTA CHECK NOTED  DIFFERENTIAL     Status: Abnormal   Collection Time    06/23/13  6:00 AM      Result Value Range   Neutrophils Relative % 76  43 - 77 %   Neutro Abs 10.1 (*) 1.7 - 7.7 K/uL   Lymphocytes Relative 10 (*) 12 - 46 %   Lymphs Abs 1.4  0.7 - 4.0 K/uL   Monocytes Relative 9  3 - 12 %   Monocytes Absolute 1.2 (*) 0.1 - 1.0 K/uL   Eosinophils Relative 4  0 - 5 %   Eosinophils Absolute 0.6  0.0 - 0.7 K/uL   Basophils Relative 0  0 - 1 %   Basophils Absolute 0.0  0.0 - 0.1 K/uL  COMPREHENSIVE METABOLIC PANEL     Status: Abnormal   Collection Time    06/23/13  6:00 AM      Result Value Range   Sodium 139  137 - 147 mEq/L   Potassium 4.3  3.7 - 5.3 mEq/L   Chloride 106  96 - 112 mEq/L   CO2 24  19 - 32 mEq/L   Glucose, Bld 109 (*) 70 - 99 mg/dL   BUN 23  6 - 23 mg/dL   Creatinine, Ser 0.95  0.50 - 1.10 mg/dL   Calcium 7.7 (*) 8.4 - 10.5 mg/dL   Total Protein 5.3 (*) 6.0 - 8.3 g/dL   Albumin 2.3 (*) 3.5 - 5.2 g/dL   AST 17  0 - 37 U/L   ALT 44 (*) 0 - 35 U/L    Alkaline Phosphatase 116  39 - 117 U/L   Total Bilirubin 0.3  0.3 - 1.2 mg/dL   GFR calc non Af Amer 62 (*) >90 mL/min   GFR calc Af Amer 72 (*) >90 mL/min   Comment: (NOTE)     The eGFR has  been calculated using the CKD EPI equation.     This calculation has not been validated in all clinical situations.     eGFR's persistently <90 mL/min signify possible Chronic Kidney     Disease.  MAGNESIUM     Status: None   Collection Time    06/23/13  6:00 AM      Result Value Range   Magnesium 1.9  1.5 - 2.5 mg/dL  PHOSPHORUS     Status: None   Collection Time    06/23/13  6:00 AM      Result Value Range   Phosphorus 4.3  2.3 - 4.6 mg/dL  GLUCOSE, CAPILLARY     Status: Abnormal   Collection Time    06/23/13  7:00 AM      Result Value Range   Glucose-Capillary 106 (*) 70 - 99 mg/dL   Comment 1 Documented in Chart     Comment 2 Notify RN      Imaging / Studies: Abd 1 View (kub)  06/22/2013   CLINICAL DATA:  Followup small bowel obstruction  EXAM: ABDOMEN - 1 VIEW  COMPARISON:  CT abdomen and pelvis 06/21/2013  FINDINGS: Right lower quadrant ostomy.  Excreted contrast material within bladder and minimally in right renal collecting system.  Single dilated loop of small bowel in the left pelvis consistent with small bowel obstruction.  No definite bowel wall thickening seen.  Endovascular coils noted right paraspinal at L1-L2.  Osseous structures unremarkable.  IMPRESSION: Persistent dilatation of a small bowel loop in the left pelvis consistent with obstruction.   Electronically Signed   By: Lavonia Dana M.D.   On: 06/22/2013 08:30   Ct Abdomen Pelvis W Contrast  06/21/2013   CLINICAL DATA:  Upper abdominal pain.  EXAM: CT ABDOMEN AND PELVIS WITH CONTRAST  TECHNIQUE: Multidetector CT imaging of the abdomen and pelvis was performed using the standard protocol following bolus administration of intravenous contrast.  CONTRAST:  164m OMNIPAQUE IOHEXOL 300 MG/ML  SOLN  COMPARISON:  CT of the  abdomen and pelvis 05/06/2013.  FINDINGS: Lung Bases: Unremarkable.  Abdomen/Pelvis: The appearance of the liver, gallbladder, pancreas, spleen, bilateral adrenal glands and right kidney is unremarkable. Several sub cm low-attenuation lesions are noted in the left kidney, similar to the prior study; although too small to definitively characterize, these are favored to represent tiny cysts. High density material posterior to the pre-pyloric gastric antrum, presumably embolization coils.  Postoperative changes of total colectomy with right lower quadrant ileostomy and Hartmann's pouch are again noted. There are several areas acute angulation of small bowel, best demonstrated on image 55 of series 2 adjacent to the dilated loop, where there is an acutely angulated loop of bowel, presumably related to underlying adhesions. There is a small amount a haziness in the small bowel mesentery. Trace volume of ascites. No pneumoperitoneum. No definite lymphadenopathy identified within the abdomen or pelvis. Status post hysterectomy. Previously noted lesion associated with the vaginal cuff is less cystic in appearance and smaller, currently measuring 2.7 x 1.6 cm (image 60 of series 2), presumably some involuting postoperative scar tissue. This does appear intimately associated with multiple adjacent loops of small bowel, suggesting the presence of adhesions adjacent to the vaginal cuff. Urinary bladder is unremarkable in appearance.  Musculoskeletal: There are no aggressive appearing lytic or blastic lesions noted in the visualized portions of the skeleton.  IMPRESSION: 1. 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 There is fecalization of small bowel contents in this region. Despite the presence of dilated loops of small bowel, the more proximal portion of the small bowel does not appear dilated,  suggesting against frank obstruction at this time. 2. Small amount of edema in the small bowel mesentery and trace volume of ascites may suggest some active inflammation in this patient with history of Crohn's disease. 3. No pneumoperitoneum. 4. Additional incidental findings, as above, similar prior studies.   Electronically Signed   By: Vinnie Langton M.D.   On: 06/21/2013 19:32   Dg Chest Port 1 View  06/22/2013   CLINICAL DATA:  Chest pain and dyspnea.  EXAM: PORTABLE CHEST - 1 VIEW  COMPARISON:  DG CHEST 1V PORT dated 05/07/2013  FINDINGS: DG CHEST 1V PORT dated 12/13/2014The lungs are hyperinflated. The interstitial markings are increased bilaterally. The cardiac silhouette is not enlarged. The pulmonary vascularity is mildly prominent centrally. There is no pleural effusion or pneumothorax. The right subclavian venous catheter tip lies in the region of the midportion of the SVC. There is severe degenerative change of the right shoulder which is stable.  IMPRESSION: 1. Increased interstitial markings bilaterally have developed that suggests interstitial edema. This may be of cardiac or noncardiac cause. There is no focal pneumonia. 2. Hyperinflation is stable consistent with COPD.   Electronically Signed   By: David  Martinique   On: 06/22/2013 17:16   Dg Abd 2 Views  06/23/2013   CLINICAL DATA:  Mid abdominal pain, small bowel obstruction  EXAM: ABDOMEN - 2 VIEW  COMPARISON:  Abdominal radiograph dated 06/22/2013. CT abdomen pelvis dated 06/21/2013.  FINDINGS: Nonobstructive bowel gas pattern. Prior prominent small bowel loop in the left mid abdomen is not well visualized on the current study. Right lower quadrant ostomy. Surgical sutures overlying the pelvis.  Embolization coils overlying the mid abdomen.  No evidence of free air under the diaphragm on the upright view.  IMPRESSION: No findings to suggest small bowel obstruction.  Right lower quadrant ostomy.  No free air.   Electronically Signed   By:  Julian Hy M.D.   On: 06/23/2013 08:24    Medications / Allergies: per chart  Antibiotics: Anti-infectives   Start     Dose/Rate Route Frequency Ordered Stop   06/23/13 0600  cefOXitin (MEFOXIN) 1 g in dextrose 5 % 50 mL IVPB  Status:  Discontinued     1 g 100 mL/hr over 30 Minutes Intravenous On call to O.R. 06/22/13 1440 06/22/13 1442   06/22/13 0605  hydroxychloroquine (PLAQUENIL) tablet 200 mg  Status:  Discontinued     200 mg Oral 2 times daily 06/22/13 0605 06/22/13 1432        Assessment/Plan End stage COPD  Diabetes Mellitus Type 2  Hx Crohn's disease  S/p Exploratory laparotomy with LOA, parastomal hernia repair with insertion of biologic mesh Dr. Excell Seltzer 03/16/13  Hx colectomy around 2007 at outside facility(Crohn's)  PCM  FUO  Leukocytosis  PSBO, recurrent, does not appear completely obstructed -start on clear liquid diet, WOULD NOT ADVANCE PAST FULL LIQUID.  Will discuss further plans with Dr. Excell Seltzer -continue with TPN -mobilize -change morphine to fentanyl(bp dropped with morphine last admission) -oral pain meds  Erby Pian, [facility] Pager [phone] Office [phone]  06/23/2013 9:51 AM

## 2013-06-23 NOTE — Progress Notes (Signed)
This is her normal pattern--she gets better, then gets sick again in one to two months.  Will discuss with other surgeons about the appropriate plan.  Marta Lamas. Gae Bon, MD, FACS [phone] [facility]

## 2013-06-23 NOTE — Progress Notes (Signed)
Two extensions added to pt's O2

## 2013-06-24 ENCOUNTER — Encounter (INDEPENDENT_AMBULATORY_CARE_PROVIDER_SITE_OTHER): Payer: Medicare Other | Admitting: General Surgery

## 2013-06-24 LAB — GLUCOSE, CAPILLARY
GLUCOSE-CAPILLARY: 137 mg/dL — AB (ref 70–99)
GLUCOSE-CAPILLARY: 146 mg/dL — AB (ref 70–99)
Glucose-Capillary: 140 mg/dL — ABNORMAL HIGH (ref 70–99)

## 2013-06-24 LAB — BASIC METABOLIC PANEL
BUN: 19 mg/dL (ref 6–23)
CHLORIDE: 109 meq/L (ref 96–112)
CO2: 24 mEq/L (ref 19–32)
Calcium: 8.2 mg/dL — ABNORMAL LOW (ref 8.4–10.5)
Creatinine, Ser: 0.79 mg/dL (ref 0.50–1.10)
GFR calc non Af Amer: 87 mL/min — ABNORMAL LOW (ref >90)
Glucose, Bld: 111 mg/dL — ABNORMAL HIGH (ref 70–99)
POTASSIUM: 4.8 meq/L (ref 3.7–5.3)
Sodium: 141 mEq/L (ref 137–147)

## 2013-06-24 MED ORDER — FAT EMULSION 20 % IV EMUL
250.0000 mL | INTRAVENOUS | Status: AC
  Administered 2013-06-24: 250 mL via INTRAVENOUS
  Filled 2013-06-24: qty 250

## 2013-06-24 MED ORDER — FENTANYL CITRATE 0.05 MG/ML IJ SOLN
50.0000 ug | INTRAMUSCULAR | Status: DC | PRN
  Administered 2013-06-24 – 2013-06-25 (×4): 50 ug via INTRAVENOUS
  Filled 2013-06-24 (×4): qty 2

## 2013-06-24 MED ORDER — TRACE MINERALS CR-CU-F-FE-I-MN-MO-SE-ZN IV SOLN
INTRAVENOUS | Status: AC
  Administered 2013-06-24: 17:00:00 via INTRAVENOUS
  Filled 2013-06-24: qty 2000

## 2013-06-24 NOTE — Progress Notes (Signed)
Patient frustrated.  Will get SBFT tomorrow to look for possible stricture.    Marta Lamas. Gae Bon, MD, FACS [phone] [facility]

## 2013-06-24 NOTE — Progress Notes (Signed)
[facility] PROGRESS NOTE  [name] EGB:[id number] DOB: 05-07-1951 DOA: 06/21/2013 PCP: [name], MD  Assessment/Plan: #1 probable small bowel obstruction Patient had presented with nausea, vomiting and abdominal pain. Patient with upper abdominal pain.Currently afebrile. KUB done shows improvement. Ileostomy with good output. Patient with no emesis or nausea. Continue TPN. Keep potassium greater than 4 and magnesium greater than 2. CCS ff. Patient on clears. CCS ff and appreciate input and rxcs.  #2 fever/leukocytosis Questionable etiology. May be reactive secondary to problem #1. Afebrile x 24hrs. Blood cultures x2 pending. UA is negative. with cultures and sensitivities. Chest x-ray negative for infiltrate. Will follow for now. No need for any antibiotics at this time.  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 Continue Pulmicort and brovana, Spiriva.  #4 gastroesophageal reflux disease Continue Pepcid, PPI  #5 malnutrition On TPN. Hold pro-stat until problem #1 is resolved.  #6 depression Continue Celexa.  #7 hypertension Blood pressure is borderline. Resume Cardizem. Morphine has been discontinued as per general surgery caused a drop in patient's blood pressure during last hospitalization. Follow.  #8 type 2 diabetes Stable. Sliding scale insulin.  #9 history of rheumatoid arthritis Stable. Hold Plaquenil. Outpatient followup.  #10 prophylaxis PPI for GI prophylaxis. Lovenox for DVT prophylaxis.   Code Status: Full Family Communication: Updated patient no family at bedside. Disposition Plan: Home when medically stable.   Consultants:  None  Procedures:  CT abdomen and pelvis 06/21/2013  KUB 06/22/2013, 06/23/2013  Antibiotics:  None  HPI/Subjective: Patient c/o diffuse abdominal pain while on clears. Patient with  ileostomy output of 1325cc/24hrs. No emesis.  Objective: Filed Vitals:   06/24/13 0526  BP: 102/57   Pulse: 114  Temp: 98.5 F (36.9 C)  Resp: 20    Intake/Output Summary (Last 24 hours) at 06/24/13 0850 Last data filed at 06/24/13 0600  Gross per 24 hour  Intake 7157.22 ml  Output   2500 ml  Net 4657.22 ml   Filed Weights   06/22/13 0538 06/23/13 1310  Weight: 57.425 kg (126 lb 9.6 oz) 56.246 kg (124 lb)    Exam:   General:  nad  Cardiovascular: rrr  Respiratory: soft/NT/ND/+BS  Abdomen: Soft/diffuse TTP/+decreased BS. Ileostomy bag with loose stool.  Musculoskeletal: No c/c/e  Data Reviewed: Basic Metabolic Panel:  Recent Labs Lab 06/21/13 1522 06/22/13 0805 06/22/13 1231 06/23/13 0600 06/24/13 0405  NA 143 141  --  139 141  K 4.6 4.6  --  4.3 4.8  CL 104 106  --  106 109  CO2 29 24  --  24 24  GLUCOSE 79 96  --  109* 111*  BUN 25* 20  --  23 19  CREATININE 0.78 0.95  --  0.95 0.79  CALCIUM 8.8 8.3*  --  7.7* 8.2*  MG  --   --  1.7 1.9  --   PHOS  --   --  3.4 4.3  --    Liver Function Tests:  Recent Labs Lab 06/21/13 1522 06/23/13 0600  AST 23 17  ALT 82* 44*  ALKPHOS 182* 116  BILITOT 0.2* 0.3  PROT 6.8 5.3*  ALBUMIN 3.1* 2.3*    Recent Labs Lab 06/21/13 1522  LIPASE 29   No results found for this basename: AMMONIA,  in the last 168 hours CBC:  Recent Labs Lab 06/21/13 1522 06/22/13 0805 06/23/13 0600  WBC 13.5* 11.8* 13.2*  NEUTROABS 9.7*  --  10.1*  HGB 10.6* 9.4* 7.9*  HCT 32.4* 30.0*  24.6*  MCV 88.5 90.9 89.5  PLT 224 213 150   Cardiac Enzymes: No results found for this basename: CKTOTAL, CKMB, CKMBINDEX, TROPONINI,  in the last 168 hours BNP (last 3 results) No results found for this basename: PROBNP,  in the last 8760 hours CBG:  Recent Labs Lab 06/23/13 1151 06/23/13 1328 06/23/13 1755 06/23/13 2254 06/24/13 0533  GLUCAP 126* 110* 81 144* 137*    Recent Results (from the past 240 hour(s))  URINE CULTURE     Status: None   Collection Time    06/21/13  5:09 PM      Result Value Range Status    Specimen Description URINE, CLEAN CATCH   Final   Special Requests NONE   Final   Culture  Setup Time     Final   Value: 06/21/2013 18:15     Performed at [facility] Count     Final   Value: >=100,000 COLONIES/ML     Performed at [facility]   Culture     Final   Value: Multiple bacterial morphotypes present, none predominant. Suggest appropriate recollection if clinically indicated.     Performed at [facility]   Report Status 06/23/2013 FINAL   Final  URINE CULTURE     Status: None   Collection Time    06/22/13  2:34 PM      Result Value Range Status   Specimen Description URINE, CLEAN CATCH   Final   Special Requests NONE   Final   Culture  Setup Time     Final   Value: 06/22/2013 20:33     Performed at [facility] Count     Final   Value: >=100,000 COLONIES/ML     Performed at [facility]   Culture     Final   Value: Multiple bacterial morphotypes present, none predominant. Suggest appropriate recollection if clinically indicated.     Performed at [facility]   Report Status 06/23/2013 FINAL   Final  CULTURE, BLOOD (ROUTINE X 2)     Status: None   Collection Time    06/22/13  4:00 PM      Result Value Range Status   Specimen Description BLOOD LEFT ARM   Final   Special Requests BOTTLES DRAWN AEROBIC ONLY 10CC   Final   Culture  Setup Time     Final   Value: 06/22/2013 22:50     Performed at [facility]   Culture     Final   Value:        BLOOD CULTURE RECEIVED NO GROWTH TO DATE CULTURE WILL BE HELD FOR 5 DAYS BEFORE ISSUING A FINAL NEGATIVE REPORT     Performed at [facility]   Report Status PENDING   Incomplete  CULTURE, BLOOD (ROUTINE X 2)     Status: None   Collection Time    06/22/13  4:10 PM      Result Value Range Status   Specimen Description BLOOD LEFT HAND   Final   Special Requests BOTTLES DRAWN AEROBIC ONLY 10CC   Final   Culture  Setup Time     Final   Value:  06/22/2013 22:49     Performed at [facility]   Culture     Final   Value:        BLOOD CULTURE RECEIVED NO GROWTH TO DATE CULTURE WILL BE HELD FOR 5 DAYS BEFORE  ISSUING A FINAL NEGATIVE REPORT     Performed at [facility]   Report Status PENDING   Incomplete     Studies: Dg Chest Port 1 View  06/22/2013   CLINICAL DATA:  Chest pain and dyspnea.  EXAM: PORTABLE CHEST - 1 VIEW  COMPARISON:  DG CHEST 1V PORT dated 05/07/2013  FINDINGS: DG CHEST 1V PORT dated 12/13/2014The lungs are hyperinflated. The interstitial markings are increased bilaterally. The cardiac silhouette is not enlarged. The pulmonary vascularity is mildly prominent centrally. There is no pleural effusion or pneumothorax. The right subclavian venous catheter tip lies in the region of the midportion of the SVC. There is severe degenerative change of the right shoulder which is stable.  IMPRESSION: 1. Increased interstitial markings bilaterally have developed that suggests interstitial edema. This may be of cardiac or noncardiac cause. There is no focal pneumonia. 2. Hyperinflation is stable consistent with COPD.   Electronically Signed   By: David  [place]   On: 06/22/2013 17:16   Dg Abd 2 Views  06/23/2013   CLINICAL DATA:  Mid abdominal pain, small bowel obstruction  EXAM: ABDOMEN - 2 VIEW  COMPARISON:  Abdominal radiograph dated 06/22/2013. CT abdomen pelvis dated 06/21/2013.  FINDINGS: Nonobstructive bowel gas pattern. Prior prominent small bowel loop in the left mid abdomen is not well visualized on the current study. Right lower quadrant ostomy. Surgical sutures overlying the pelvis.  Embolization coils overlying the mid abdomen.  No evidence of free air under the diaphragm on the upright view.  IMPRESSION: No findings to suggest small bowel obstruction.  Right lower quadrant ostomy.  No free air.   Electronically Signed   By: Charline Bills M.D.   On: 06/23/2013 08:24    Scheduled Meds: . arformoterol  15 mcg  Nebulization BID  . aspirin EC  81 mg Oral Q M,W,F  . budesonide  0.25 mg Nebulization BID  . citalopram  20 mg Oral Daily  . enoxaparin (LOVENOX) injection  40 mg Subcutaneous Daily  . famotidine (PEPCID) IV  20 mg Intravenous Q12H  . feeding supplement (PRO-STAT SUGAR FREE 64)  30 mL Oral Daily  . fluticasone  2 spray Each Nare Daily  . insulin aspart  0-9 Units Subcutaneous Q8H  . pantoprazole (PROTONIX) IV  40 mg Intravenous Q24H  . sodium chloride  10-40 mL Intracatheter Q12H  . tiotropium  18 mcg Inhalation Daily   Continuous Infusions: . sodium chloride 100 mL/hr at 06/23/13 2044    Principal Problem:   SBO (small bowel obstruction) Active Problems:   DIABETES, TYPE 2   HYPERLIPIDEMIA   GERD   CROHN'S DISEASE   Anxiety disorder   Severe protein-calorie malnutrition   Failure to thrive in adult   Abdominal pain   Fever, unspecified   Incarcerated right inguinal hernia    Time spent: 35 mins    [facility] MD [facility] Pager [phone]. If 7PM-7AM, please contact night-coverage at [email or web address], password [facility] 06/24/2013, 8:50 AM  LOS: 3 days

## 2013-06-24 NOTE — Evaluation (Signed)
Physical Therapy Evaluation Patient Details Name: [name] MRN: [id number] DOB: 07-22-1950 Today's Date: 06/24/2013 Time: 1210-1239 PT Time Calculation (min): 29 min  PT Assessment / Plan / Recommendation History of Present Illness  [name] is an 63 year old patient of Dr. Johna Sheriff with a history of COPD, crohn's disease, diabetes mellitus, anemia, s/p exploratory laparotomy on 03/16/13, previous ileostomy. She was hospitalized 03/27/13-04/06/13 due to a pelvic abscess which required IR drain placement. She was admitted again 11/25-11/26 with n/v and abdominal pain. She was found to have a decreasing intra-abdominal abscess unable to be drained by IR.  Clinical Impression  Pt demonstrated Mod Independence with gait on the unit and no drop in O2 sats noted after walk. Pt reports she is getting to the [facility] without assistance. I would recommend ambulation on the unit with a tech only to assist with O2 tank and IV pole management. Pt does not have a skilled acute PT need and is agreeable to walk with the staff. Pt is D/C from PT services.     PT Assessment  Patent does not need any further PT services    Follow Up Recommendations  No PT follow up    Does the patient have the potential to tolerate intense rehabilitation      Barriers to Discharge        Equipment Recommendations  None recommended by PT    Recommendations for Other Services     Frequency      Precautions / Restrictions Restrictions Weight Bearing Restrictions: No   Pertinent Vitals/Pain       Mobility  Bed Mobility Overal bed mobility: Independent Transfers Overall transfer level: Modified independent General transfer comment: use of hands and increased time due to abdominal discomfort with activity Ambulation/Gait Ambulation/Gait assistance: Modified independent (Device/Increase time) (needs a tech to bring O2 tank) Ambulation Distance (Feet): 325 Feet Assistive device: None Gait  Pattern/deviations: WFL(Within Functional Limits) Gait velocity: decreased Gait velocity interpretation: Below normal speed for age/gender    Exercises     PT Diagnosis:    PT Problem List:   PT Treatment Interventions:       PT Goals(Current goals can be found in the care plan section) Acute Rehab PT Goals Patient Stated Goal: To walk  Visit Information  Last PT Received On: 06/24/13 Assistance Needed: +1 (for equipment only) History of Present Illness: [name] is an 63 year old patient of Dr. Johna Sheriff with a history of COPD, crohn's disease, diabetes mellitus, anemia, s/p exploratory laparotomy on 03/16/13, previous ileostomy. She was hospitalized 03/27/13-04/06/13 due to a pelvic abscess which required IR drain placement. She was admitted again 11/25-11/26 with n/v and abdominal pain. She was found to have a decreasing intra-abdominal abscess unable to be drained by IR.       Prior Functioning  Home Living Family/patient expects to be discharged to:: Private residence Living Arrangements: Alone Available Help at Discharge: Family Type of Home: Apartment Home Access: Level entry Home Layout: One level Home Equipment: Cane - single point;Walker - 2 wheels Additional Comments: Pt lives in a senior resident apartment, but is completely Independent. Prior Function Level of Independence: Independent Communication Communication: No difficulties    Cognition  Cognition Arousal/Alertness: Awake/alert Behavior During Therapy: WFL for tasks assessed/performed Overall Cognitive Status: Within Functional Limits for tasks assessed    Extremity/Trunk Assessment Lower Extremity Assessment Lower Extremity Assessment: Overall WFL for tasks assessed Cervical / Trunk Assessment Cervical / Trunk Assessment: Normal   Balance Balance Overall balance  assessment: Independent  End of Session PT - End of Session Equipment Utilized During Treatment: Oxygen Activity Tolerance:  Patient tolerated treatment well Patient left: in bed;with call bell/phone within reach Nurse Communication: Mobility status  GP     Greggory Stallion 06/24/2013, 12:46 PM

## 2013-06-24 NOTE — Progress Notes (Signed)
NUTRITION FOLLOW UP  Intervention:   TPN per pharmacy Diet advancement per MD Continue Pro-stat once daily Encourage PO intake as tolerated RD to continue to monitor  Nutrition Dx:   Inadequate oral intake related to abdominal pain as evidenced by TPN, clear liquid diet, and meal completion <50% .   Goal:   Pt to meet >/= 90% of their estimated nutrition needs; being met  Monitor:   TPN rate, PO intake, weight trends, labs   Assessment:   Pt receiving cyclic TPN: Cyclic clinimix E 1/44 over 12 hour cycle and 20% fat emulsion at 16 ml/hr for 12 hours which will provide ~78 gm protein, 1485 kcal per day. This meets 96% of estimated energy needs and 100% of estimated protein needs. Pt also receives Pro-stat once daily which provide 100 kcal and 15 grams of protein.  Pt reports stomach pain after PO intake of small amounts of clear liquids. Pt's weight appears to be fairly stable.  Encourage pt to continue to eat/drink small amounts as tolerated. She states she is hungry and has a good appetite; once pain is resolved she feels she will be able to eat well.    Height: Ht Readings from Last 1 Encounters:  06/23/13 5' 1"  (1.549 m)    Weight Status:   Wt Readings from Last 1 Encounters:  06/23/13 124 lb (56.246 kg)  Decreased 2 lbs from admission weight 1/28. Pt reports weighing 115 lbs just prior to admission.   Re-estimated needs:  Kcal: 1550-1780  Protein: 70-85 grams  Fluid: 1.5-1.7 L/day  Skin: WDL  Diet Order: Clear Liquid   Intake/Output Summary (Last 24 hours) at 06/24/13 1555 Last data filed at 06/24/13 1521  Gross per 24 hour  Intake 8007.22 ml  Output   2750 ml  Net 5257.22 ml    Last BM: 1/29   Labs:   Recent Labs Lab 06/22/13 0805 06/22/13 1231 06/23/13 0600 06/24/13 0405  NA 141  --  139 141  K 4.6  --  4.3 4.8  CL 106  --  106 109  CO2 24  --  24 24  BUN 20  --  23 19  CREATININE 0.95  --  0.95 0.79  CALCIUM 8.3*  --  7.7* 8.2*  MG  --   1.7 1.9  --   PHOS  --  3.4 4.3  --   GLUCOSE 96  --  109* 111*    CBG (last 3)   Recent Labs  06/23/13 2254 06/24/13 0533 06/24/13 1429  GLUCAP 144* 137* 140*    Scheduled Meds: . arformoterol  15 mcg Nebulization BID  . aspirin EC  81 mg Oral Q M,W,F  . budesonide  0.25 mg Nebulization BID  . citalopram  20 mg Oral Daily  . enoxaparin (LOVENOX) injection  40 mg Subcutaneous Daily  . famotidine (PEPCID) IV  20 mg Intravenous Q12H  . feeding supplement (PRO-STAT SUGAR FREE 64)  30 mL Oral Daily  . fluticasone  2 spray Each Nare Daily  . insulin aspart  0-9 Units Subcutaneous Q8H  . pantoprazole (PROTONIX) IV  40 mg Intravenous Q24H  . sodium chloride  10-40 mL Intracatheter Q12H  . tiotropium  18 mcg Inhalation Daily    Continuous Infusions: . sodium chloride 100 mL/hr at 06/24/13 1257  . Marland KitchenTPN (CLINIMIX-E) Adult     And  . fat emulsion      Pryor Ochoa RD, LDN Inpatient Clinical Dietitian Pager: [phone] After Hours Pager: [phone]

## 2013-06-24 NOTE — Progress Notes (Signed)
PARENTERAL NUTRITION CONSULT NOTE - Follow Up  Pharmacy Consult for TPN Indication: Home TPN for long history of Crohn's disease, recent small bowel obstruction and pelvic abscess and subsequent partial small bowel obstruction   Allergies  Allergen Reactions  . Esomeprazole Magnesium Other (See Comments)    NEXIUM - reaction > aggravated pt's Crohn's disease  . Shellfish Allergy Shortness Of Breath and Nausea And Vomiting  . Surgical Lubricant Other (See Comments)    Burns skin   . Other Nausea And Vomiting    Beans, Dander/Dust, Peas, Mushrooms  . Peanut-Containing Drug Products Nausea And Vomiting  . Levaquin [Levofloxacin]     Patient Measurements: Height: 5' 1"  (154.9 cm) Weight: 124 lb (56.246 kg) IBW/kg (Calculated) : 47.8 Usual Weight: 57 kg  Vital Signs: Temp: 98.5 F (36.9 C) (01/30 0526) Temp src: Oral (01/30 0526) BP: 102/57 mmHg (01/30 0526) Pulse Rate: 114 (01/30 0526) Intake/Output from previous day: 01/29 0701 - 01/30 0700 In: 7157.2 [P.O.:100; I.V.:3600; IV Piggyback:100; EXN:1700.1] Out: 2500 [VCBSW:9675; FFMBW:4665] Intake/Output from this shift:    Labs:  Recent Labs  06/21/13 1522 06/22/13 0805 06/23/13 0600  WBC 13.5* 11.8* 13.2*  HGB 10.6* 9.4* 7.9*  HCT 32.4* 30.0* 24.6*  PLT 224 213 150     Recent Labs  06/21/13 1522 06/22/13 0805 06/22/13 1231 06/23/13 0600  NA 143 141  --  139  K 4.6 4.6  --  4.3  CL 104 106  --  106  CO2 29 24  --  24  GLUCOSE 79 96  --  109*  BUN 25* 20  --  23  CREATININE 0.78 0.95  --  0.95  CALCIUM 8.8 8.3*  --  7.7*  MG  --   --  1.7 1.9  PHOS  --   --  3.4 4.3  PROT 6.8  --   --  5.3*  ALBUMIN 3.1*  --   --  2.3*  AST 23  --   --  17  ALT 82*  --   --  44*  ALKPHOS 182*  --   --  116  BILITOT 0.2*  --   --  0.3  PREALBUMIN  --   --   --  12.3*  TRIG  --   --   --  39   Estimated Creatinine Clearance: 45.7 ml/min (by C-G formula based on Cr of 0.95).    Recent Labs  06/23/13 1755  06/23/13 2254 06/24/13 0533  GLUCAP 81 144* 137*    Medical History: Past Medical History  Diagnosis Date  . Other diseases of vocal cords   . Osteoporosis, unspecified   . Other and unspecified hyperlipidemia   . Esophageal reflux   . Chronic airway obstruction, not elsewhere classified   . Regional enteritis of unspecified site   . 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 Crohn's disease     Ileostomy  . Pelvic adhesions   . Anxiety   . Depression   . Hypertension   . Emphysema   . Hematuria   . Severe cervical dysplasia age 25    cone biopsy with neg paps since  . Pneumonia     "3-4 times; necrotizing once" (04/19/2013)  . Asthmatic bronchitis , chronic   . Exertional shortness of breath   . On home oxygen therapy     "24/7; 2.5L @ rest; 3L when I'm doing my chores" (04/19/2013)  . Type II diabetes mellitus   . Anemia   .  History of blood transfusion     "I've had 3 or 4" (04/19/2013)  . Rheumatoid arthritis(714.0) 03/2012    [place]; "mild in my hands" (04/19/2013)  . Small bowel obstruction due to adhesions 2014    [place]  . Pelvic abscess in female 2014    Medications:  Prescriptions prior to admission  Medication Sig Dispense Refill  . Amino Acids-Protein Hydrolys (FEEDING SUPPLEMENT, PRO-STAT SUGAR FREE 64,) LIQD Take 30 mLs by mouth daily.  900 mL  0  . arformoterol (BROVANA) 15 MCG/2ML NEBU Take 15 mcg by nebulization 2 (two) times daily.      [name] aspirin 81 MG tablet Take 81 mg by mouth every Monday, Wednesday, and Friday.       . budesonide (PULMICORT) 0.25 MG/2ML nebulizer solution Take 0.25 mg by nebulization 2 (two) times daily.      . calcium-vitamin D (OSCAL WITH D) 500-200 MG-UNIT per tablet Take 1 tablet by mouth 2 (two) times daily.      . celecoxib (CELEBREX) 200 MG capsule Take 200 mg by mouth daily.      . Cholecalciferol (VITAMIN D) 1000 UNITS capsule Take 1,000 Units by mouth daily.       . citalopram (CELEXA) 20 MG tablet  Take 20 mg by mouth daily.       [name] diltiazem (TIAZAC) 180 MG 24 hr capsule Take 180 mg by mouth 2 (two) times daily.      . famotidine (PEPCID) 20 MG tablet Take 20 mg by mouth 2 (two) times daily.      . fluticasone (FLONASE) 50 MCG/ACT nasal spray Place 2 sprays into the nose daily as needed.       . furosemide (LASIX) 20 MG tablet Take 20 mg by mouth 2 (two) times daily. Once or twice daily as needed for swelling      . guaiFENesin (MUCINEX) 600 MG 12 hr tablet Take 1,200 mg by mouth 2 (two) times daily.      [name] HYDROcodone-acetaminophen (NORCO/VICODIN) 5-325 MG per tablet Take 1-2 tablets by mouth every 6 (six) hours as needed for moderate pain or severe pain.  40 tablet  0  . hydroxychloroquine (PLAQUENIL) 200 MG tablet Take 200 mg by mouth Twice daily.       . IRON PO Take 1 tablet by mouth every Monday, Wednesday, and Friday.       . Liraglutide (VICTOZA) 18 MG/3ML SOLN Inject 1.8 mg into the skin daily. 1.8 units once daily      . LORazepam (ATIVAN) 0.5 MG tablet Take 0.5 mg by mouth every 8 (eight) hours as needed for anxiety.      [name] omeprazole (PRILOSEC) 40 MG capsule Take 40 mg by mouth 2 (two) times daily.       . ondansetron (ZOFRAN) 4 MG tablet Take 1 tablet (4 mg total) by mouth every 6 (six) hours as needed for nausea.  20 tablet  0  . PROAIR HFA 108 (90 BASE) MCG/ACT inhaler Inhale 2 puffs into the lungs every 6 (six) hours as needed for wheezing or shortness of breath.       . rosuvastatin (CRESTOR) 5 MG tablet Take 5 mg by mouth every Monday, Wednesday, and Friday.       . tiotropium (SPIRIVA) 18 MCG inhalation capsule Place 18 mcg into inhaler and inhale daily.      . TPN ADULT Inject into the vein continuous. On at 9am off at 9pm   144.29m/hr  (17369mover 12 hours)      .  zoledronic acid (RECLAST) 5 MG/100ML SOLN injection Inject 5 mg into the vein once. Once a year in May      . Respiratory Therapy Supplies (FLUTTER) DEVI Inhale into the lungs. 2-4 times daily         Insulin Requirements in the past 24 hours:  4 units  Current Nutrition:  Clear liquids currently PTA: cyclic TPN and small amounts of soft/chopped diet (12 hr cyclic TPN at home provides 78 gm protein, 1467 kcal daily)  Nutritional Goals:  Kcal: 1550-1780; Protein: 70-85 grams (per RD note 1/28)  Assessment: 63 y.o. female presents with N/V x 1 day. Noted past medical history significant for Crohn's disease status post ileostomy, multiple abscesses and small bowel obstructions in the past and multiple surgeries for adhesional lysis and small bowel obstruction in addition to subtotal colectomy. Pt on cyclic TPN at home and small amounts of soft foods.  GI: Abd pain, N/V. CT scan not conclusive for SBO. Pt with h/o multiple bowel obstuctions. Clear liquid diet. PPI po and pepcid po (on both pepcid and omeprazole PTA)  Prealb 12.3  Endo: DM 2. No insulin in home TPN. CBGs 106-181 (liraglutide PTA)  SSI 4 units given  Lytes: K 4.8  Renal: SCr wnl at baseline. MIVF NS at 100 ml/hr  Pulm: 2L [place]. H/o obstructive chronic bronchitis; home O2. Arfomoterol, budesonide.  Cards: BP low, HR 90-100s. HTN. ASA 81, home meds statin, dilt, lasix not presently receiveing  Hepatobil: Alk phos, ALT elevated. TG 39  Neuro: anxiety, depression. citalopram  ID: Tm 99.2. Wbc 13.2  Best Practices: Enox 40  TPN Access: PICC (PTA)  Plan:  1. Cont cyclic clinimix E 4/11 over 12 hour cycle (57m/hr for 1 hr then 145 ml/hr for 10 hrs then 579mhr for 1 hr) and 20% fat emulsion at 16 ml/hr for 12 hours which will provide ~78 gm protein, 1485 kcal per day. (This most closely matches pt's home TPN formulation) 2. Empiric CBGs and sensitive SSI q8h  3. Daily MVI and trace elements 4. Will f/u clinical progress  LoExcell SeltzerPharmD Clinical pharmacist, #[phone]/30/2015,7:18 AM

## 2013-06-24 NOTE — Progress Notes (Signed)
Subjective: She has more output thru the ostomy, but still having abdominal discomfort.  She says it hurts across the mid and upper abdomen.    Objective: Vital signs in last 24 hours: Temp:  [98.4 F (36.9 C)-99.5 F (37.5 C)] 98.5 F (36.9 C) (01/30 0526) Pulse Rate:  [91-114] 114 (01/30 0526) Resp:  [16-20] 20 (01/30 0526) BP: (80-110)/(40-57) 102/57 mmHg (01/30 0526) SpO2:  [99 %-100 %] 100 % (01/30 0526) Weight:  [56.246 kg (124 lb)] 56.246 kg (124 lb) (01/29 1310) Last BM Date: 06/23/13 1000 from ileostomy recorded. Clear diet/TNA BMP OK Intake/Output from previous day: 01/29 0701 - 01/30 0700 In: 7157.2 [P.O.:100; I.V.:3600; IV Piggyback:100; TPN:3357.2] Out: 2500 [Urine:1175; Stool:1325] Intake/Output this shift:    General appearance: alert, cooperative and no distress Resp: clear to auscultation bilaterally and BS down in bases GI: soft, mildly distended, very few BS.  She says she hurts, but she isn't tender to palpation.  Lab Results:   Recent Labs  06/22/13 0805 06/23/13 0600  WBC 11.8* 13.2*  HGB 9.4* 7.9*  HCT 30.0* 24.6*  PLT 213 150    BMET  Recent Labs  06/23/13 0600 06/24/13 0405  NA 139 141  K 4.3 4.8  CL 106 109  CO2 24 24  GLUCOSE 109* 111*  BUN 23 19  CREATININE 0.95 0.79  CALCIUM 7.7* 8.2*   PT/INR No results found for this basename: LABPROT, INR,  in the last 72 hours   Recent Labs Lab 06/21/13 1522 06/23/13 0600  AST 23 17  ALT 82* 44*  ALKPHOS 182* 116  BILITOT 0.2* 0.3  PROT 6.8 5.3*  ALBUMIN 3.1* 2.3*     Lipase     Component Value Date/Time   LIPASE 29 06/21/2013 1522     Studies/Results: Dg Chest Port 1 View  06/22/2013   CLINICAL DATA:  Chest pain and dyspnea.  EXAM: PORTABLE CHEST - 1 VIEW  COMPARISON:  DG CHEST 1V PORT dated 05/07/2013  FINDINGS: DG CHEST 1V PORT dated 12/13/2014The lungs are hyperinflated. The interstitial markings are increased bilaterally. The cardiac silhouette is not enlarged.  The pulmonary vascularity is mildly prominent centrally. There is no pleural effusion or pneumothorax. The right subclavian venous catheter tip lies in the region of the midportion of the SVC. There is severe degenerative change of the right shoulder which is stable.  IMPRESSION: 1. Increased interstitial markings bilaterally have developed that suggests interstitial edema. This may be of cardiac or noncardiac cause. There is no focal pneumonia. 2. Hyperinflation is stable consistent with COPD.   Electronically Signed   By: David  [place]   On: 06/22/2013 17:16   Dg Abd 2 Views  06/23/2013   CLINICAL DATA:  Mid abdominal pain, small bowel obstruction  EXAM: ABDOMEN - 2 VIEW  COMPARISON:  Abdominal radiograph dated 06/22/2013. CT abdomen pelvis dated 06/21/2013.  FINDINGS: Nonobstructive bowel gas pattern. Prior prominent small bowel loop in the left mid abdomen is not well visualized on the current study. Right lower quadrant ostomy. Surgical sutures overlying the pelvis.  Embolization coils overlying the mid abdomen.  No evidence of free air under the diaphragm on the upright view.  IMPRESSION: No findings to suggest small bowel obstruction.  Right lower quadrant ostomy.  No free air.   Electronically Signed   By: Charline Bills M.D.   On: 06/23/2013 08:24    Medications: . arformoterol  15 mcg Nebulization BID  . aspirin EC  81 mg Oral Q M,W,F  .  budesonide  0.25 mg Nebulization BID  . citalopram  20 mg Oral Daily  . enoxaparin (LOVENOX) injection  40 mg Subcutaneous Daily  . famotidine (PEPCID) IV  20 mg Intravenous Q12H  . feeding supplement (PRO-STAT SUGAR FREE 64)  30 mL Oral Daily  . fluticasone  2 spray Each Nare Daily  . insulin aspart  0-9 Units Subcutaneous Q8H  . pantoprazole (PROTONIX) IV  40 mg Intravenous Q24H  . sodium chloride  10-40 mL Intracatheter Q12H  . tiotropium  18 mcg Inhalation Daily    Assessment/Plan Recurrent PSBO, 04/16/13, 04/25/13, 05/07/13, Discharged on full  liquids and TNA, readmitted 06/22/13. Hx colectomy around 2007 at outside facility(Crohn's) with ileostomy S/p Exploratory laparotomy with LOA, parastomal hernia repair with insertion of biologic mesh Dr. Johna Sheriff 03/16/13  CT guided Trans glueteal pelvic abscess drain placement 03/28/13/hx of multiple abscesses Crohn's disease  Severe COPD/hx of pneumonia last year/Home O2 AODM Anemia Hypertension rheumatoid Arthritis  Plan;  Leave her at clears and TNA at this point. I talked with Dr. Lindie Spruce and we will get UGI with small bowel follow thru.    LOS: 3 days    [name] 06/24/2013

## 2013-06-25 ENCOUNTER — Inpatient Hospital Stay (HOSPITAL_COMMUNITY): Payer: Medicare Other

## 2013-06-25 DIAGNOSIS — K219 Gastro-esophageal reflux disease without esophagitis: Secondary | ICD-10-CM

## 2013-06-25 DIAGNOSIS — K56609 Unspecified intestinal obstruction, unspecified as to partial versus complete obstruction: Secondary | ICD-10-CM

## 2013-06-25 LAB — CBC
HEMATOCRIT: 24.1 % — AB (ref 36.0–46.0)
Hemoglobin: 7.9 g/dL — ABNORMAL LOW (ref 12.0–15.0)
MCH: 29.3 pg (ref 26.0–34.0)
MCHC: 32.8 g/dL (ref 30.0–36.0)
MCV: 89.3 fL (ref 78.0–100.0)
Platelets: 168 10*3/uL (ref 150–400)
RBC: 2.7 MIL/uL — ABNORMAL LOW (ref 3.87–5.11)
RDW: 14.3 % (ref 11.5–15.5)
WBC: 6.1 10*3/uL (ref 4.0–10.5)

## 2013-06-25 LAB — GLUCOSE, CAPILLARY
GLUCOSE-CAPILLARY: 130 mg/dL — AB (ref 70–99)
Glucose-Capillary: 130 mg/dL — ABNORMAL HIGH (ref 70–99)
Glucose-Capillary: 75 mg/dL (ref 70–99)

## 2013-06-25 LAB — BASIC METABOLIC PANEL
BUN: 22 mg/dL (ref 6–23)
CO2: 21 mEq/L (ref 19–32)
CREATININE: 0.78 mg/dL (ref 0.50–1.10)
Calcium: 8.7 mg/dL (ref 8.4–10.5)
Chloride: 108 mEq/L (ref 96–112)
GFR, EST NON AFRICAN AMERICAN: 87 mL/min — AB (ref >90)
Glucose, Bld: 113 mg/dL — ABNORMAL HIGH (ref 70–99)
Potassium: 4.4 mEq/L (ref 3.7–5.3)
Sodium: 140 mEq/L (ref 137–147)

## 2013-06-25 LAB — MRSA PCR SCREENING: MRSA by PCR: NEGATIVE

## 2013-06-25 MED ORDER — FAT EMULSION 20 % IV EMUL
250.0000 mL | INTRAVENOUS | Status: AC
  Administered 2013-06-25: 250 mL via INTRAVENOUS
  Filled 2013-06-25: qty 250

## 2013-06-25 MED ORDER — TRACE MINERALS CR-CU-F-FE-I-MN-MO-SE-ZN IV SOLN
INTRAVENOUS | Status: AC
  Administered 2013-06-25: 18:00:00 via INTRAVENOUS
  Filled 2013-06-25: qty 2000

## 2013-06-25 MED ORDER — FENTANYL CITRATE 0.05 MG/ML IJ SOLN: 50.0000 ug | INTRAMUSCULAR | Status: DC | PRN

## 2013-06-25 MED ORDER — IOHEXOL 300 MG/ML  SOLN: 150.0000 mL | Freq: Once | INTRAMUSCULAR | Status: AC | PRN

## 2013-06-25 MED ORDER — IOHEXOL 300 MG/ML  SOLN
150.0000 mL | Freq: Once | INTRAMUSCULAR | Status: AC | PRN
  Administered 2013-06-25: 150 mL via ORAL

## 2013-06-25 NOTE — Progress Notes (Signed)
  Subjective: Still having some soreness to the left of her midline scar.  Ileostomy is working well.  Objective: Vital signs in last 24 hours: Temp:  [98.3 F (36.8 C)-99.1 F (37.3 C)] 98.3 F (36.8 C) (01/31 0521) Pulse Rate:  [96-104] 101 (01/31 0521) Resp:  [16-20] 16 (01/31 0521) BP: (107-114)/(47-52) 107/49 mmHg (01/31 0521) SpO2:  [99 %-100 %] 100 % (01/31 0842) Last BM Date: 06/24/13  Intake/Output from previous day: 01/30 0701 - 01/31 0700 In: 3402.3 [I.V.:960; IV Piggyback:50; TPN:2392.3] Out: 3000 [Urine:2150; Stool:850] Intake/Output this shift:    PE: General- In NAD Abdomen-soft, green liquid output from ileostomy, mild tenderness to the left of her midline incision  Lab Results:   Recent Labs  06/23/13 0600 06/25/13 0535  WBC 13.2* 6.1  HGB 7.9* 7.9*  HCT 24.6* 24.1*  PLT 150 168   BMET  Recent Labs  06/24/13 0405 06/25/13 0535  NA 141 140  K 4.8 4.4  CL 109 108  CO2 24 21  GLUCOSE 111* 113*  BUN 19 22  CREATININE 0.79 0.78  CALCIUM 8.2* 8.7   PT/INR No results found for this basename: LABPROT, INR,  in the last 72 hours Comprehensive Metabolic Panel:    Component Value Date/Time   NA 140 06/25/2013 0535   NA 141 06/24/2013 0405   K 4.4 06/25/2013 0535   K 4.8 06/24/2013 0405   CL 108 06/25/2013 0535   CL 109 06/24/2013 0405   CO2 21 06/25/2013 0535   CO2 24 06/24/2013 0405   BUN 22 06/25/2013 0535   BUN 19 06/24/2013 0405   CREATININE 0.78 06/25/2013 0535   CREATININE 0.79 06/24/2013 0405   CREATININE 1.04 08/27/2012 1054   CREATININE 0.98 07/15/2011 0920   GLUCOSE 113* 06/25/2013 0535   GLUCOSE 111* 06/24/2013 0405   CALCIUM 8.7 06/25/2013 0535   CALCIUM 8.2* 06/24/2013 0405   CALCIUM 9.0 07/15/2011 0920   AST 17 06/23/2013 0600   AST 23 06/21/2013 1522   ALT 44* 06/23/2013 0600   ALT 82* 06/21/2013 1522   ALKPHOS 116 06/23/2013 0600   ALKPHOS 182* 06/21/2013 1522   BILITOT 0.3 06/23/2013 0600   BILITOT 0.2* 06/21/2013 1522   PROT 5.3*  06/23/2013 0600   PROT 6.8 06/21/2013 1522   ALBUMIN 2.3* 06/23/2013 0600   ALBUMIN 3.1* 06/21/2013 1522     Studies/Results: No results found.  Anti-infectives: Anti-infectives   Start     Dose/Rate Route Frequency Ordered Stop   06/23/13 0600  cefOXitin (MEFOXIN) 1 g in dextrose 5 % 50 mL IVPB  Status:  Discontinued     1 g 100 mL/hr over 30 Minutes Intravenous On call to O.R. 06/22/13 1440 06/22/13 1442   06/22/13 0605  hydroxychloroquine (PLAQUENIL) tablet 200 mg  Status:  Discontinued     200 mg Oral 2 times daily 06/22/13 0605 06/22/13 1432      Assessment Principal Problem:   Recurrent partial SBO (small bowel obstruction) Active Problems:   CROHN'S DISEASE   Severe protein-calorie malnutrition   Failure to thrive in adult       LOS: 4 days   Plan:   SBFT today.   Reene Harlacher J 06/25/2013

## 2013-06-25 NOTE — Progress Notes (Signed)
PARENTERAL NUTRITION CONSULT NOTE - Follow Up  Pharmacy Consult for TPN Indication: Home TPN for long history of Crohn's disease, recent small bowel obstruction and pelvic abscess and subsequent partial small bowel obstruction   Allergies  Allergen Reactions  . Esomeprazole Magnesium Other (See Comments)    NEXIUM - reaction > aggravated pt's Crohn's disease  . Shellfish Allergy Shortness Of Breath and Nausea And Vomiting  . Surgical Lubricant Other (See Comments)    Burns skin   . Other Nausea And Vomiting    Beans, Dander/Dust, Peas, Mushrooms  . Peanut-Containing Drug Products Nausea And Vomiting  . Levaquin [Levofloxacin]     Patient Measurements: Height: 5' 1"  (154.9 cm) Weight: 124 lb (56.246 kg) IBW/kg (Calculated) : 47.8 Usual Weight: 57 kg  Vital Signs: Temp: 98.3 F (36.8 C) (01/31 0521) Temp src: Oral (01/31 0521) BP: 107/49 mmHg (01/31 0521) Pulse Rate: 101 (01/31 0521) Intake/Output from previous day: 01/30 0701 - 01/31 0700 In: 3402.3 [I.V.:960; IV Piggyback:50; TPN:2392.3] Out: 3000 [Urine:2150; Stool:850] Intake/Output from this shift:    Labs:  Recent Labs  06/23/13 0600 06/25/13 0535  WBC 13.2* 6.1  HGB 7.9* 7.9*  HCT 24.6* 24.1*  PLT 150 168     Recent Labs  06/22/13 1231 06/23/13 0600 06/24/13 0405 06/25/13 0535  NA  --  139 141 140  K  --  4.3 4.8 4.4  CL  --  106 109 108  CO2  --  24 24 21   GLUCOSE  --  109* 111* 113*  BUN  --  23 19 22   CREATININE  --  0.95 0.79 0.78  CALCIUM  --  7.7* 8.2* 8.7  MG 1.7 1.9  --   --   PHOS 3.4 4.3  --   --   PROT  --  5.3*  --   --   ALBUMIN  --  2.3*  --   --   AST  --  17  --   --   ALT  --  44*  --   --   ALKPHOS  --  116  --   --   BILITOT  --  0.3  --   --   PREALBUMIN  --  12.3*  --   --   TRIG  --  39  --   --    Estimated Creatinine Clearance: 54.3 ml/min (by C-G formula based on Cr of 0.78).    Recent Labs  06/24/13 1429 06/24/13 2053 06/25/13 0557  GLUCAP 140* 146*  130*    Medical History: Past Medical History  Diagnosis Date  . Other diseases of vocal cords   . Osteoporosis, unspecified   . Other and unspecified hyperlipidemia   . Esophageal reflux   . Chronic airway obstruction, not elsewhere classified   . Regional enteritis of unspecified site   . 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 Crohn's disease     Ileostomy  . Pelvic adhesions   . Anxiety   . Depression   . Hypertension   . Emphysema   . Hematuria   . Severe cervical dysplasia age 55    cone biopsy with neg paps since  . Pneumonia     "3-4 times; necrotizing once" (04/19/2013)  . Asthmatic bronchitis , chronic   . Exertional shortness of breath   . On home oxygen therapy     "24/7; 2.5L @ rest; 3L when I'm doing my chores" (04/19/2013)  . Type II diabetes mellitus   .  Anemia   . History of blood transfusion     "I've had 3 or 4" (04/19/2013)  . Rheumatoid arthritis(714.0) 03/2012    [place]; "mild in my hands" (04/19/2013)  . Small bowel obstruction due to adhesions 2014    [place]  . Pelvic abscess in female 2014    Medications:  Prescriptions prior to admission  Medication Sig Dispense Refill  . Amino Acids-Protein Hydrolys (FEEDING SUPPLEMENT, PRO-STAT SUGAR FREE 64,) LIQD Take 30 mLs by mouth daily.  900 mL  0  . arformoterol (BROVANA) 15 MCG/2ML NEBU Take 15 mcg by nebulization 2 (two) times daily.      [name] aspirin 81 MG tablet Take 81 mg by mouth every Monday, Wednesday, and Friday.       . budesonide (PULMICORT) 0.25 MG/2ML nebulizer solution Take 0.25 mg by nebulization 2 (two) times daily.      . calcium-vitamin D (OSCAL WITH D) 500-200 MG-UNIT per tablet Take 1 tablet by mouth 2 (two) times daily.      . celecoxib (CELEBREX) 200 MG capsule Take 200 mg by mouth daily.      . Cholecalciferol (VITAMIN D) 1000 UNITS capsule Take 1,000 Units by mouth daily.       . citalopram (CELEXA) 20 MG tablet Take 20 mg by mouth daily.       [name] diltiazem  (TIAZAC) 180 MG 24 hr capsule Take 180 mg by mouth 2 (two) times daily.      . famotidine (PEPCID) 20 MG tablet Take 20 mg by mouth 2 (two) times daily.      . fluticasone (FLONASE) 50 MCG/ACT nasal spray Place 2 sprays into the nose daily as needed.       . furosemide (LASIX) 20 MG tablet Take 20 mg by mouth 2 (two) times daily. Once or twice daily as needed for swelling      . guaiFENesin (MUCINEX) 600 MG 12 hr tablet Take 1,200 mg by mouth 2 (two) times daily.      [name] HYDROcodone-acetaminophen (NORCO/VICODIN) 5-325 MG per tablet Take 1-2 tablets by mouth every 6 (six) hours as needed for moderate pain or severe pain.  40 tablet  0  . hydroxychloroquine (PLAQUENIL) 200 MG tablet Take 200 mg by mouth Twice daily.       . IRON PO Take 1 tablet by mouth every Monday, Wednesday, and Friday.       . Liraglutide (VICTOZA) 18 MG/3ML SOLN Inject 1.8 mg into the skin daily. 1.8 units once daily      . LORazepam (ATIVAN) 0.5 MG tablet Take 0.5 mg by mouth every 8 (eight) hours as needed for anxiety.      [name] omeprazole (PRILOSEC) 40 MG capsule Take 40 mg by mouth 2 (two) times daily.       . ondansetron (ZOFRAN) 4 MG tablet Take 1 tablet (4 mg total) by mouth every 6 (six) hours as needed for nausea.  20 tablet  0  . PROAIR HFA 108 (90 BASE) MCG/ACT inhaler Inhale 2 puffs into the lungs every 6 (six) hours as needed for wheezing or shortness of breath.       . rosuvastatin (CRESTOR) 5 MG tablet Take 5 mg by mouth every Monday, Wednesday, and Friday.       . tiotropium (SPIRIVA) 18 MCG inhalation capsule Place 18 mcg into inhaler and inhale daily.      . TPN ADULT Inject into the vein continuous. On at [place] off at 9pm   144.78m/hr  (  1774m over 12 hours)      . zoledronic acid (RECLAST) 5 MG/100ML SOLN injection Inject 5 mg into the vein once. Once a year in May      . Respiratory Therapy Supplies (FLUTTER) DEVI Inhale into the lungs. 2-4 times daily        Insulin Requirements in the past 24 hours:  3  units  Current Nutrition:  Clear liquids currently PTA: cyclic TPN and small amounts of soft/chopped diet (12 hr cyclic TPN at home provides 78 gm protein, 1467 kcal daily)  Nutritional Goals:  Kcal: 1550-1780; Protein: 70-85 grams (per RD note 1/28)  Assessment: 63y.o. female presents with N/V x 1 day. Noted past medical history significant for Crohn's disease status post ileostomy, multiple abscesses and small bowel obstructions in the past and multiple surgeries for adhesional lysis and small bowel obstruction in addition to subtotal colectomy. Pt on cyclic TPN at home and small amounts of soft foods.  GI: Abd pain, N/V. Pt with h/o multiple bowel obstuctions. Still with abdominal discomfort.  For SBFT today.  Clear liquid diet. PPI po and pepcid po (on both pepcid and omeprazole PTA)  Prealb 12.3  Endo: DM 2. No insulin in home TPN. CBGs 130-146 (liraglutide PTA)  SSI 3 units given  Lytes: K 4.4  Renal: SCr wnl at baseline. MIVF NS at 100 ml/hr  Pulm: 2L [place]. H/o obstructive chronic bronchitis; home O2. Arfomoterol, budesonide.  Cards: BP low, HR 90-100s. HTN. ASA 81, home meds statin, dilt, lasix not presently receiving  Hepatobil: Alk phos, ALT elevated. TG 39  Neuro: anxiety, depression. citalopram  ID: Tm 99.1. Wbc 6.1  Best Practices: Enox 40  TPN Access: PICC (PTA)  Plan:  1. Cont cyclic clinimix E 52/11over 12 hour cycle (574mhr for 1 hr then 145 ml/hr for 10 hrs then 5019mr for 1 hr) and 20% fat emulsion at 16 ml/hr for 12 hours which will provide ~78 gm protein, 1485 kcal per day. (This most closely matches pt's home TPN formulation) 2. Empiric CBGs and sensitive SSI q8h  3. Daily MVI and trace elements 4. Will f/u clinical progress 5. Check TPN labs on Monday 2/2.  MicLegrand Comoharm.D., BCPS, AAHIVP Clinical Pharmacist Phone: [phone], 8:37 AM

## 2013-06-25 NOTE — Progress Notes (Signed)
[facility] PROGRESS NOTE  [name] JXB:[id number] DOB: Oct 14, 1950 DOA: 06/21/2013 PCP: [name], MD  Assessment/Plan: #1 probable small bowel obstruction Patient had presented with nausea, vomiting and abdominal pain. Patient with upper abdominal pain which has improved since this morning.Currently afebrile. KUB done shows improvement. Ileostomy with good output. Patient with no emesis or nausea. Continue TPN. Keep potassium greater than 4 and magnesium greater than 2. Patient had a small bowel follow-through today which is unremarkable. CCS ff. Patient diet to be advanced per general surgery. CCS ff and appreciate input and rxcs.  #2 fever/leukocytosis Questionable etiology. May be reactive secondary to problem #1. Afebrile x 48hrs. WBC trending down. Blood cultures x2 pending. UA is negative.  Chest x-ray negative for infiltrate. Will follow for now. No need for any antibiotics at this time.  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 Continue Pulmicort and brovana, Spiriva.  #4 gastroesophageal reflux disease Continue Pepcid, PPI  #5 malnutrition On TPN. Hold pro-stat until problem #1 is resolved.  #6 depression Continue Celexa.  #7 hypertension Blood pressure is borderline. Resumed Cardizem. Morphine has been discontinued as per general surgery caused a drop in patient's blood pressure during last hospitalization. Follow.  #8 type 2 diabetes Stable. Sliding scale insulin.  #9 history of rheumatoid arthritis Stable. Hold Plaquenil. Outpatient followup.  #10 prophylaxis PPI for GI prophylaxis. Lovenox for DVT prophylaxis.   Code Status: Full Family Communication: Updated patient no family at bedside. Disposition Plan: Home when medically stable.   Consultants:  General surgery: Dr. Lindie Spruce 06/22/2013  Procedures:  CT abdomen and pelvis 06/21/2013  KUB 06/22/2013, 06/23/2013  Small bowel follow-through  06/25/2013  Antibiotics:  None  HPI/Subjective: Patient states abdominal pain has improved since this morning. Patient with  ileostomy output of 1325cc/24hrs. No emesis.  Objective: Filed Vitals:   06/25/13 1450  BP: 107/56  Pulse: 101  Temp: 99 F (37.2 C)  Resp: 18    Intake/Output Summary (Last 24 hours) at 06/25/13 1915 Last data filed at 06/25/13 1908  Gross per 24 hour  Intake 2552.32 ml  Output   1600 ml  Net 952.32 ml   Filed Weights   06/22/13 0538 06/23/13 1310  Weight: 57.425 kg (126 lb 9.6 oz) 56.246 kg (124 lb)    Exam:   General:  nad  Cardiovascular: rrr  Respiratory: soft/NT/ND/+BS  Abdomen: Soft/ NTTP/+decreased BS. Ileostomy bag with loose stool.  Musculoskeletal: No c/c/e  Data Reviewed: Basic Metabolic Panel:  Recent Labs Lab 06/21/13 1522 06/22/13 0805 06/22/13 1231 06/23/13 0600 06/24/13 0405 06/25/13 0535  NA 143 141  --  139 141 140  K 4.6 4.6  --  4.3 4.8 4.4  CL 104 106  --  106 109 108  CO2 29 24  --  24 24 21   GLUCOSE 79 96  --  109* 111* 113*  BUN 25* 20  --  23 19 22   CREATININE 0.78 0.95  --  0.95 0.79 0.78  CALCIUM 8.8 8.3*  --  7.7* 8.2* 8.7  MG  --   --  1.7 1.9  --   --   PHOS  --   --  3.4 4.3  --   --    Liver Function Tests:  Recent Labs Lab 06/21/13 1522 06/23/13 0600  AST 23 17  ALT 82* 44*  ALKPHOS 182* 116  BILITOT 0.2* 0.3  PROT 6.8 5.3*  ALBUMIN 3.1* 2.3*    Recent Labs Lab 06/21/13 1522  LIPASE 29  No results found for this basename: AMMONIA,  in the last 168 hours CBC:  Recent Labs Lab 06/21/13 1522 06/22/13 0805 06/23/13 0600 06/25/13 0535  WBC 13.5* 11.8* 13.2* 6.1  NEUTROABS 9.7*  --  10.1*  --   HGB 10.6* 9.4* 7.9* 7.9*  HCT 32.4* 30.0* 24.6* 24.1*  MCV 88.5 90.9 89.5 89.3  PLT 224 213 150 168   Cardiac Enzymes: No results found for this basename: CKTOTAL, CKMB, CKMBINDEX, TROPONINI,  in the last 168 hours BNP (last 3 results) No results found for this basename:  PROBNP,  in the last 8760 hours CBG:  Recent Labs Lab 06/24/13 0533 06/24/13 1429 06/24/13 2053 06/25/13 0557 06/25/13 1526  GLUCAP 137* 140* 146* 130* 75    Recent Results (from the past 240 hour(s))  URINE CULTURE     Status: None   Collection Time    06/21/13  5:09 PM      Result Value Range Status   Specimen Description URINE, CLEAN CATCH   Final   Special Requests NONE   Final   Culture  Setup Time     Final   Value: 06/21/2013 18:15     Performed at [facility] Count     Final   Value: >=100,000 COLONIES/ML     Performed at [facility]   Culture     Final   Value: Multiple bacterial morphotypes present, none predominant. Suggest appropriate recollection if clinically indicated.     Performed at [facility]   Report Status 06/23/2013 FINAL   Final  URINE CULTURE     Status: None   Collection Time    06/22/13  2:34 PM      Result Value Range Status   Specimen Description URINE, CLEAN CATCH   Final   Special Requests NONE   Final   Culture  Setup Time     Final   Value: 06/22/2013 20:33     Performed at [facility] Count     Final   Value: >=100,000 COLONIES/ML     Performed at [facility]   Culture     Final   Value: Multiple bacterial morphotypes present, none predominant. Suggest appropriate recollection if clinically indicated.     Performed at [facility]   Report Status 06/23/2013 FINAL   Final  CULTURE, BLOOD (ROUTINE X 2)     Status: None   Collection Time    06/22/13  4:00 PM      Result Value Range Status   Specimen Description BLOOD LEFT ARM   Final   Special Requests BOTTLES DRAWN AEROBIC ONLY 10CC   Final   Culture  Setup Time     Final   Value: 06/22/2013 22:50     Performed at [facility]   Culture     Final   Value:        BLOOD CULTURE RECEIVED NO GROWTH TO DATE CULTURE WILL BE HELD FOR 5 DAYS BEFORE ISSUING A FINAL NEGATIVE REPORT     Performed at [facility]   Report Status PENDING   Incomplete  CULTURE, BLOOD (ROUTINE X 2)     Status: None   Collection Time    06/22/13  4:10 PM      Result Value Range Status   Specimen Description BLOOD LEFT HAND   Final   Special Requests BOTTLES DRAWN AEROBIC ONLY 10CC   Final   Culture  Setup Time     Final   Value: 06/22/2013 22:49     Performed at [facility]   Culture     Final   Value:        BLOOD CULTURE RECEIVED NO GROWTH TO DATE CULTURE WILL BE HELD FOR 5 DAYS BEFORE ISSUING A FINAL NEGATIVE REPORT     Performed at [facility]   Report Status PENDING   Incomplete  MRSA PCR SCREENING     Status: None   Collection Time    06/25/13  9:12 AM      Result Value Range Status   MRSA by PCR NEGATIVE  NEGATIVE Final   Comment:            The GeneXpert MRSA Assay (FDA     approved for NASAL specimens     only), is one component of a     comprehensive MRSA colonization     surveillance program. It is not     intended to diagnose MRSA     infection nor to guide or     monitor treatment for     MRSA infections.     Studies: Dg Ugi W/small Bowel  06/25/2013   CLINICAL DATA:  Frequent small bowel obstruction, Crohn's disease, abdominal pain, prior subtotal colectomy and ileostomy  EXAM: UPPER GI SERIES WITH SMALL BOWEL FOLLOW-THROUGH WITH WATER-SOLUBLE CONTRAST  TECHNIQUE: Combined double contrast and single contrast upper GI series using water-soluble contrast per referring physician request. Subsequently, serial images of the small bowel were obtained including spot views of the terminal ileum.  COMPARISON:  Abdominal radiographs 06/23/2013 and CT abdomen/ pelvis 06/21/2013  FLUOROSCOPY TIME:  2 min 9 seconds  FINDINGS: Normal bowel gas pattern on scout image.  A series of coils are arrayed in a linear fashion in the mid abdomen suggesting prior embolization though patient is unaware of such a prior procedure.  Normal esophageal distention and motility.  No esophageal stricture  or obstruction.  No gastroesophageal reflux or persistent intraluminal filling defects.  Stomach distends normally with normal rugal fold pattern.  No mass, ulceration or gastric outlet obstruction.  Ligament of Treitz normal position.  Duodenal C loop grossly normal in course though suboptimally distended and opacified during the exam, no gross abnormality seen.  Normal transit of contrast through small bowel loops to the ileostomy at 1 hr.  Small bowel loops are suboptimally visualized due to use of water-soluble contrast material, no gross wall thickening or mucosal irregularity identified.  No evidence of stricture or obstruction.  A single loop in the central to right pelvis is slightly larger than remaining loops though contrast passes beyond without obstruction.  Compression views demonstrate normal mucosal folds without focal abnormality or persistent intraluminal filling defect.  IMPRESSION: Normal exam.  No focal bowel abnormalities or evidence of obstruction identified.   Electronically Signed   By: Ulyses Southward M.D.   On: 06/25/2013 13:12    Scheduled Meds: . arformoterol  15 mcg Nebulization BID  . aspirin EC  81 mg Oral Q M,W,F  . budesonide  0.25 mg Nebulization BID  . citalopram  20 mg Oral Daily  . enoxaparin (LOVENOX) injection  40 mg Subcutaneous Daily  . famotidine (PEPCID) IV  20 mg Intravenous Q12H  . feeding supplement (PRO-STAT SUGAR FREE 64)  30 mL Oral Daily  . fluticasone  2 spray Each Nare Daily  . insulin aspart  0-9 Units Subcutaneous Q8H  . pantoprazole (PROTONIX) IV  40  mg Intravenous Q24H  . sodium chloride  10-40 mL Intracatheter Q12H  . tiotropium  18 mcg Inhalation Daily   Continuous Infusions: . sodium chloride 10 mL/hr at 06/25/13 1821  . Marland KitchenTPN (CLINIMIX-E) Adult 50 mL/hr at 06/25/13 1745   And  . fat emulsion 250 mL (06/25/13 1745)    Principal Problem:   SBO (small bowel obstruction) Active Problems:   DIABETES, TYPE 2   HYPERLIPIDEMIA   GERD    CROHN'S DISEASE   Anxiety disorder   Severe protein-calorie malnutrition   Failure to thrive in adult   Abdominal pain   Fever, unspecified   Incarcerated right inguinal hernia    Time spent: 35 mins    [facility] MD [facility] Pager [phone]. If 7PM-7AM, please contact night-coverage at [email or web address], password [facility] 06/25/2013, 7:15 PM  LOS: 4 days

## 2013-06-26 LAB — GLUCOSE, CAPILLARY
Glucose-Capillary: 102 mg/dL — ABNORMAL HIGH (ref 70–99)
Glucose-Capillary: 100 mg/dL — ABNORMAL HIGH (ref 70–99)

## 2013-06-26 MED ORDER — FAMOTIDINE 20 MG PO TABS
20.0000 mg | ORAL_TABLET | Freq: Every day | ORAL | Status: DC
  Administered 2013-06-26 – 2013-06-27 (×2): 20 mg via ORAL
  Filled 2013-06-26 (×2): qty 1

## 2013-06-26 MED ORDER — PANTOPRAZOLE SODIUM 40 MG PO TBEC
40.0000 mg | DELAYED_RELEASE_TABLET | Freq: Every day | ORAL | Status: DC
  Administered 2013-06-26 – 2013-06-27 (×2): 40 mg via ORAL
  Filled 2013-06-26 (×3): qty 1

## 2013-06-26 MED ORDER — FAT EMULSION 20 % IV EMUL
250.0000 mL | INTRAVENOUS | Status: DC
  Filled 2013-06-26: qty 250

## 2013-06-26 MED ORDER — TRACE MINERALS CR-CU-F-FE-I-MN-MO-SE-ZN IV SOLN
INTRAVENOUS | Status: DC
  Filled 2013-06-26: qty 2000

## 2013-06-26 NOTE — Progress Notes (Signed)
[facility] PROGRESS NOTE  [name] FHL:[id number] DOB: Feb 22, 1951 DOA: 06/21/2013 PCP: [name], MD  Assessment/Plan: #1 probable small bowel obstruction Patient had presented with nausea, vomiting and abdominal pain. Patient with upper abdominal pain which has improved and patient currently tolerating low residue diet. Currently afebrile. KUB done shows improvement. Ileostomy with good output. Patient with no emesis or nausea. Patient had a small bowel follow-through which was unremarkable. Patient's diet has been advanced to a low-residue diet per general surgery. TPN has been discontinued per general surgery. General surgeon following and appreciate input and recommendations.   #2 fever/leukocytosis Questionable etiology. May be reactive secondary to problem #1. Afebrile x 72hrs. WBC trending down. Blood cultures x2 pending. UA is negative.  Chest x-ray negative for infiltrate. Will follow for now. No need for any antibiotics at this time.  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 Continue Pulmicort and brovana, Spiriva.  #4 gastroesophageal reflux disease Continue Pepcid, PPI  #5 malnutrition Diet has been advanced to low residue diet per general surgery and TPN has been discontinued.   #6 depression Continue Celexa.  #7 hypertension Blood pressure improved after discontinuation of morphine. Continue Cardizem.   #8 type 2 diabetes Stable. Sliding scale insulin.  #9 history of rheumatoid arthritis Stable. Hold Plaquenil. Outpatient followup.  #10 prophylaxis PPI for GI prophylaxis. Lovenox for DVT prophylaxis.   Code Status: Full Family Communication: Updated patient no family at bedside. Disposition Plan: Home when medically stable.   Consultants:  General surgery: Dr. Lindie Spruce 06/22/2013  Procedures:  CT abdomen and pelvis 06/21/2013  KUB 06/22/2013, 06/23/2013  Small bowel follow-through  06/25/2013  Antibiotics:  None  HPI/Subjective: Patient states abdominal pain has improved. Patient started on a solid diet and states that is currently tolerating it. Patient states she's feeling much better.   Objective: Filed Vitals:   06/26/13 0508  BP: 110/42  Pulse: 104  Temp: 98.6 F (37 C)  Resp: 18    Intake/Output Summary (Last 24 hours) at 06/26/13 1404 Last data filed at 06/26/13 1108  Gross per 24 hour  Intake     10 ml  Output   1800 ml  Net  -1790 ml   Filed Weights   06/22/13 0538 06/23/13 1310  Weight: 57.425 kg (126 lb 9.6 oz) 56.246 kg (124 lb)    Exam:   General:  nad  Cardiovascular: rrr  Respiratory: soft/NT/ND/+BS  Abdomen: Soft/ NTTP/+ BS. Ileostomy bag with loose stool.  Musculoskeletal: No c/c/e  Data Reviewed: Basic Metabolic Panel:  Recent Labs Lab 06/21/13 1522 06/22/13 0805 06/22/13 1231 06/23/13 0600 06/24/13 0405 06/25/13 0535  NA 143 141  --  139 141 140  K 4.6 4.6  --  4.3 4.8 4.4  CL 104 106  --  106 109 108  CO2 29 24  --  24 24 21   GLUCOSE 79 96  --  109* 111* 113*  BUN 25* 20  --  23 19 22   CREATININE 0.78 0.95  --  0.95 0.79 0.78  CALCIUM 8.8 8.3*  --  7.7* 8.2* 8.7  MG  --   --  1.7 1.9  --   --   PHOS  --   --  3.4 4.3  --   --    Liver Function Tests:  Recent Labs Lab 06/21/13 1522 06/23/13 0600  AST 23 17  ALT 82* 44*  ALKPHOS 182* 116  BILITOT 0.2* 0.3  PROT 6.8 5.3*  ALBUMIN 3.1* 2.3*  Recent Labs Lab 06/21/13 1522  LIPASE 29   No results found for this basename: AMMONIA,  in the last 168 hours CBC:  Recent Labs Lab 06/21/13 1522 06/22/13 0805 06/23/13 0600 06/25/13 0535  WBC 13.5* 11.8* 13.2* 6.1  NEUTROABS 9.7*  --  10.1*  --   HGB 10.6* 9.4* 7.9* 7.9*  HCT 32.4* 30.0* 24.6* 24.1*  MCV 88.5 90.9 89.5 89.3  PLT 224 213 150 168   Cardiac Enzymes: No results found for this basename: CKTOTAL, CKMB, CKMBINDEX, TROPONINI,  in the last 168 hours BNP (last 3 results) No  results found for this basename: PROBNP,  in the last 8760 hours CBG:  Recent Labs Lab 06/25/13 0557 06/25/13 1526 06/25/13 2153 06/26/13 0510 06/26/13 0731  GLUCAP 130* 75 130* 102* 100*    Recent Results (from the past 240 hour(s))  URINE CULTURE     Status: None   Collection Time    06/21/13  5:09 PM      Result Value Range Status   Specimen Description URINE, CLEAN CATCH   Final   Special Requests NONE   Final   Culture  Setup Time     Final   Value: 06/21/2013 18:15     Performed at [facility] Count     Final   Value: >=100,000 COLONIES/ML     Performed at [facility]   Culture     Final   Value: Multiple bacterial morphotypes present, none predominant. Suggest appropriate recollection if clinically indicated.     Performed at [facility]   Report Status 06/23/2013 FINAL   Final  URINE CULTURE     Status: None   Collection Time    06/22/13  2:34 PM      Result Value Range Status   Specimen Description URINE, CLEAN CATCH   Final   Special Requests NONE   Final   Culture  Setup Time     Final   Value: 06/22/2013 20:33     Performed at [facility] Count     Final   Value: >=100,000 COLONIES/ML     Performed at [facility]   Culture     Final   Value: Multiple bacterial morphotypes present, none predominant. Suggest appropriate recollection if clinically indicated.     Performed at [facility]   Report Status 06/23/2013 FINAL   Final  CULTURE, BLOOD (ROUTINE X 2)     Status: None   Collection Time    06/22/13  4:00 PM      Result Value Range Status   Specimen Description BLOOD LEFT ARM   Final   Special Requests BOTTLES DRAWN AEROBIC ONLY 10CC   Final   Culture  Setup Time     Final   Value: 06/22/2013 22:50     Performed at [facility]   Culture     Final   Value:        BLOOD CULTURE RECEIVED NO GROWTH TO DATE CULTURE WILL BE HELD FOR 5 DAYS BEFORE ISSUING A FINAL NEGATIVE  REPORT     Performed at [facility]   Report Status PENDING   Incomplete  CULTURE, BLOOD (ROUTINE X 2)     Status: None   Collection Time    06/22/13  4:10 PM      Result Value Range Status   Specimen Description BLOOD LEFT HAND   Final   Special Requests BOTTLES  DRAWN AEROBIC ONLY 10CC   Final   Culture  Setup Time     Final   Value: 06/22/2013 22:49     Performed at [facility]   Culture     Final   Value:        BLOOD CULTURE RECEIVED NO GROWTH TO DATE CULTURE WILL BE HELD FOR 5 DAYS BEFORE ISSUING A FINAL NEGATIVE REPORT     Performed at [facility]   Report Status PENDING   Incomplete  MRSA PCR SCREENING     Status: None   Collection Time    06/25/13  9:12 AM      Result Value Range Status   MRSA by PCR NEGATIVE  NEGATIVE Final   Comment:            The GeneXpert MRSA Assay (FDA     approved for NASAL specimens     only), is one component of a     comprehensive MRSA colonization     surveillance program. It is not     intended to diagnose MRSA     infection nor to guide or     monitor treatment for     MRSA infections.     Studies: Dg Ugi W/small Bowel  06/25/2013   CLINICAL DATA:  Frequent small bowel obstruction, Crohn's disease, abdominal pain, prior subtotal colectomy and ileostomy  EXAM: UPPER GI SERIES WITH SMALL BOWEL FOLLOW-THROUGH WITH WATER-SOLUBLE CONTRAST  TECHNIQUE: Combined double contrast and single contrast upper GI series using water-soluble contrast per referring physician request. Subsequently, serial images of the small bowel were obtained including spot views of the terminal ileum.  COMPARISON:  Abdominal radiographs 06/23/2013 and CT abdomen/ pelvis 06/21/2013  FLUOROSCOPY TIME:  2 min 9 seconds  FINDINGS: Normal bowel gas pattern on scout image.  A series of coils are arrayed in a linear fashion in the mid abdomen suggesting prior embolization though patient is unaware of such a prior procedure.  Normal esophageal distention  and motility.  No esophageal stricture or obstruction.  No gastroesophageal reflux or persistent intraluminal filling defects.  Stomach distends normally with normal rugal fold pattern.  No mass, ulceration or gastric outlet obstruction.  Ligament of Treitz normal position.  Duodenal C loop grossly normal in course though suboptimally distended and opacified during the exam, no gross abnormality seen.  Normal transit of contrast through small bowel loops to the ileostomy at 1 hr.  Small bowel loops are suboptimally visualized due to use of water-soluble contrast material, no gross wall thickening or mucosal irregularity identified.  No evidence of stricture or obstruction.  A single loop in the central to right pelvis is slightly larger than remaining loops though contrast passes beyond without obstruction.  Compression views demonstrate normal mucosal folds without focal abnormality or persistent intraluminal filling defect.  IMPRESSION: Normal exam.  No focal bowel abnormalities or evidence of obstruction identified.   Electronically Signed   By: Ulyses Southward M.D.   On: 06/25/2013 13:12    Scheduled Meds: . arformoterol  15 mcg Nebulization BID  . aspirin EC  81 mg Oral Q M,W,F  . budesonide  0.25 mg Nebulization BID  . citalopram  20 mg Oral Daily  . famotidine (PEPCID) IV  20 mg Intravenous Q12H  . feeding supplement (PRO-STAT SUGAR FREE 64)  30 mL Oral Daily  . fluticasone  2 spray Each Nare Daily  . pantoprazole (PROTONIX) IV  40 mg Intravenous Q24H  . sodium chloride  10-40 mL Intracatheter Q12H  . tiotropium  18 mcg Inhalation Daily   Continuous Infusions:    Principal Problem:   SBO (small bowel obstruction) Active Problems:   DIABETES, TYPE 2   HYPERLIPIDEMIA   GERD   CROHN'S DISEASE   Anxiety disorder   Severe protein-calorie malnutrition   Failure to thrive in adult   Abdominal pain   Fever, unspecified   Incarcerated right inguinal hernia    Time spent: 35  mins    [facility] : [facility] MD [facility] Pager [phone]. If 7PM-7AM, please contact night-coverage at [email or web address], password [facility] 06/26/2013, 2:04 PM  LOS: 5 days

## 2013-06-26 NOTE — Progress Notes (Signed)
PARENTERAL NUTRITION CONSULT NOTE - Follow Up  Pharmacy Consult for TPN Indication: Home TPN for long history of Crohn's disease, recent small bowel obstruction and pelvic abscess and subsequent partial small bowel obstruction   Allergies  Allergen Reactions  . Esomeprazole Magnesium Other (See Comments)    NEXIUM - reaction > aggravated pt's Crohn's disease  . Shellfish Allergy Shortness Of Breath and Nausea And Vomiting  . Surgical Lubricant Other (See Comments)    Burns skin   . Other Nausea And Vomiting    Beans, Dander/Dust, Peas, Mushrooms  . Peanut-Containing Drug Products Nausea And Vomiting  . Levaquin [Levofloxacin]     Patient Measurements: Height: 5' 1"  (154.9 cm) Weight: 124 lb (56.246 kg) IBW/kg (Calculated) : 47.8 Usual Weight: 57 kg  Vital Signs: Temp: 98.6 F (37 C) (02/01 0508) Temp src: Oral (01/31 2345) BP: 110/42 mmHg (02/01 0508) Pulse Rate: 104 (02/01 0508) Intake/Output from previous day: 01/31 0701 - 02/01 0700 In: -  Out: 350 [Stool:350] Intake/Output from this shift:    Labs:  Recent Labs  06/25/13 0535  WBC 6.1  HGB 7.9*  HCT 24.1*  PLT 168     Recent Labs  06/24/13 0405 06/25/13 0535  NA 141 140  K 4.8 4.4  CL 109 108  CO2 24 21  GLUCOSE 111* 113*  BUN 19 22  CREATININE 0.79 0.78  CALCIUM 8.2* 8.7   Estimated Creatinine Clearance: 54.3 ml/min (by C-G formula based on Cr of 0.78).    Recent Labs  06/25/13 1526 06/25/13 2153 06/26/13 0510  GLUCAP 75 130* 102*    Medical History: Past Medical History  Diagnosis Date  . Other diseases of vocal cords   . Osteoporosis, unspecified   . Other and unspecified hyperlipidemia   . Esophageal reflux   . Chronic airway obstruction, not elsewhere classified   . Regional enteritis of unspecified site   . 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 Crohn's disease     Ileostomy  . Pelvic adhesions   . Anxiety   . Depression   . Hypertension   . Emphysema    . Hematuria   . Severe cervical dysplasia age 7    cone biopsy with neg paps since  . Pneumonia     "3-4 times; necrotizing once" (04/19/2013)  . Asthmatic bronchitis , chronic   . Exertional shortness of breath   . On home oxygen therapy     "24/7; 2.5L @ rest; 3L when I'm doing my chores" (04/19/2013)  . Type II diabetes mellitus   . Anemia   . History of blood transfusion     "I've had 3 or 4" (04/19/2013)  . Rheumatoid arthritis(714.0) 03/2012    [place]; "mild in my hands" (04/19/2013)  . Small bowel obstruction due to adhesions 2014    [place]  . Pelvic abscess in female 2014    Medications:  Prescriptions prior to admission  Medication Sig Dispense Refill  . Amino Acids-Protein Hydrolys (FEEDING SUPPLEMENT, PRO-STAT SUGAR FREE 64,) LIQD Take 30 mLs by mouth daily.  900 mL  0  . arformoterol (BROVANA) 15 MCG/2ML NEBU Take 15 mcg by nebulization 2 (two) times daily.      [name] aspirin 81 MG tablet Take 81 mg by mouth every Monday, Wednesday, and Friday.       . budesonide (PULMICORT) 0.25 MG/2ML nebulizer solution Take 0.25 mg by nebulization 2 (two) times daily.      . calcium-vitamin D (OSCAL WITH D) 500-200 MG-UNIT  per tablet Take 1 tablet by mouth 2 (two) times daily.      . celecoxib (CELEBREX) 200 MG capsule Take 200 mg by mouth daily.      . Cholecalciferol (VITAMIN D) 1000 UNITS capsule Take 1,000 Units by mouth daily.       . citalopram (CELEXA) 20 MG tablet Take 20 mg by mouth daily.       [name] diltiazem (TIAZAC) 180 MG 24 hr capsule Take 180 mg by mouth 2 (two) times daily.      . famotidine (PEPCID) 20 MG tablet Take 20 mg by mouth 2 (two) times daily.      . fluticasone (FLONASE) 50 MCG/ACT nasal spray Place 2 sprays into the nose daily as needed.       . furosemide (LASIX) 20 MG tablet Take 20 mg by mouth 2 (two) times daily. Once or twice daily as needed for swelling      . guaiFENesin (MUCINEX) 600 MG 12 hr tablet Take 1,200 mg by mouth 2 (two) times daily.       [name] HYDROcodone-acetaminophen (NORCO/VICODIN) 5-325 MG per tablet Take 1-2 tablets by mouth every 6 (six) hours as needed for moderate pain or severe pain.  40 tablet  0  . hydroxychloroquine (PLAQUENIL) 200 MG tablet Take 200 mg by mouth Twice daily.       . IRON PO Take 1 tablet by mouth every Monday, Wednesday, and Friday.       . Liraglutide (VICTOZA) 18 MG/3ML SOLN Inject 1.8 mg into the skin daily. 1.8 units once daily      . LORazepam (ATIVAN) 0.5 MG tablet Take 0.5 mg by mouth every 8 (eight) hours as needed for anxiety.      [name] omeprazole (PRILOSEC) 40 MG capsule Take 40 mg by mouth 2 (two) times daily.       . ondansetron (ZOFRAN) 4 MG tablet Take 1 tablet (4 mg total) by mouth every 6 (six) hours as needed for nausea.  20 tablet  0  . PROAIR HFA 108 (90 BASE) MCG/ACT inhaler Inhale 2 puffs into the lungs every 6 (six) hours as needed for wheezing or shortness of breath.       . rosuvastatin (CRESTOR) 5 MG tablet Take 5 mg by mouth every Monday, Wednesday, and Friday.       . tiotropium (SPIRIVA) 18 MCG inhalation capsule Place 18 mcg into inhaler and inhale daily.      . TPN ADULT Inject into the vein continuous. On at 9am off at 9pm   144.86m/hr  (17345mover 12 hours)      . zoledronic acid (RECLAST) 5 MG/100ML SOLN injection Inject 5 mg into the vein once. Once a year in May      . Respiratory Therapy Supplies (FLUTTER) DEVI Inhale into the lungs. 2-4 times daily        Insulin Requirements in the past 24 hours:  1 unit  Current Nutrition:  Dysphagia 3 diet PTA: cyclic TPN and small amounts of soft/chopped diet (12 hr cyclic TPN at home provides 78 gm protein, 1467 kcal daily)  Nutritional Goals:  Kcal: 1550-1780; Protein: 70-85 grams (per RD note 1/28)  Assessment: [age].o. female presents with N/V x 1 day. Noted past medical history significant for Crohn's disease status post ileostomy, multiple abscesses and small bowel obstructions in the past and multiple surgeries for  adhesional lysis and small bowel obstruction in addition to subtotal colectomy. Pt on cyclic TPN at home and  small amounts of soft foods.  GI: Abd pain, N/V. Pt with h/o multiple bowel obstuctions. Still with abdominal discomfort.  SBFT 1/31 - no obstruction.  Diet advanced to DYS 3. PPI po and pepcid po (on both pepcid and omeprazole PTA)  Prealb 12.3  Endo: DM 2. No insulin in home TPN. CBGs 75-130 (liraglutide PTA)  SSI 1 unit given - all CBGs <180.  Lytes: K 4.4  Renal: SCr wnl at baseline. MIVF NS at 10 ml/hr  Pulm: 3L [place]. H/o obstructive chronic bronchitis; home O2. Arfomoterol, budesonide.  Cards: BP low, HR 90-100s. HTN. ASA 81, home meds statin, dilt, lasix not presently receiving  Hepatobil: Alk phos, ALT elevated. TG 39  Neuro: anxiety, depression. citalopram  ID: Tm 99. Wbc 6.1  Best Practices: Enox 40  TPN Access: PICC (PTA)  Plan:  1. Continue cyclic clinimix E 6/71 over 12 hour cycle (69m/hr for 1 hr then 145 ml/hr for 10 hrs then 56mhr for 1 hr) and 20% fat emulsion at 16 ml/hr for 12 hours which will provide ~78 gm protein, 1485 kcal per day. (This most closely matches pt's home TPN formulation) 2. Will stop CBGs and SSI as she is well-controlled 3. Daily MVI and trace elements 4. Will f/u clinical progress 5. Check TPN labs on Monday 2/2.  MiLegrand ComoPharm.D., BCPS, AAHIVP Clinical Pharmacist Phone: [phone]/05/2013, 8:14 AM

## 2013-06-26 NOTE — Progress Notes (Addendum)
Subjective: Pain is better.  Tolerating pureed diet.  Objective: Vital signs in last 24 hours: Temp:  [98.2 F (36.8 C)-99 F (37.2 C)] 98.6 F (37 C) (02/01 0508) Pulse Rate:  [93-104] 104 (02/01 0508) Resp:  [16-18] 18 (02/01 0508) BP: (101-110)/(42-56) 110/42 mmHg (02/01 0508) SpO2:  [98 %-100 %] 100 % (02/01 0743) Last BM Date: 06/25/13  Intake/Output from previous day: 01/31 0701 - 02/01 0700 In: -  Out: 350 [Stool:350] Intake/Output this shift:    PE: General- In NAD Abdomen-soft, no significant tenderness, ileostomy with liquid and gas output  Lab Results:   Recent Labs  06/25/13 0535  WBC 6.1  HGB 7.9*  HCT 24.1*  PLT 168   BMET  Recent Labs  06/24/13 0405 06/25/13 0535  NA 141 140  K 4.8 4.4  CL 109 108  CO2 24 21  GLUCOSE 111* 113*  BUN 19 22  CREATININE 0.79 0.78  CALCIUM 8.2* 8.7   PT/INR No results found for this basename: LABPROT, INR,  in the last 72 hours Comprehensive Metabolic Panel:    Component Value Date/Time   NA 140 06/25/2013 0535   NA 141 06/24/2013 0405   K 4.4 06/25/2013 0535   K 4.8 06/24/2013 0405   CL 108 06/25/2013 0535   CL 109 06/24/2013 0405   CO2 21 06/25/2013 0535   CO2 24 06/24/2013 0405   BUN 22 06/25/2013 0535   BUN 19 06/24/2013 0405   CREATININE 0.78 06/25/2013 0535   CREATININE 0.79 06/24/2013 0405   CREATININE 1.04 08/27/2012 1054   CREATININE 0.98 07/15/2011 0920   GLUCOSE 113* 06/25/2013 0535   GLUCOSE 111* 06/24/2013 0405   CALCIUM 8.7 06/25/2013 0535   CALCIUM 8.2* 06/24/2013 0405   CALCIUM 9.0 07/15/2011 0920   AST 17 06/23/2013 0600   AST 23 06/21/2013 1522   ALT 44* 06/23/2013 0600   ALT 82* 06/21/2013 1522   ALKPHOS 116 06/23/2013 0600   ALKPHOS 182* 06/21/2013 1522   BILITOT 0.3 06/23/2013 0600   BILITOT 0.2* 06/21/2013 1522   PROT 5.3* 06/23/2013 0600   PROT 6.8 06/21/2013 1522   ALBUMIN 2.3* 06/23/2013 0600   ALBUMIN 3.1* 06/21/2013 1522     Studies/Results: Dg Ugi W/small Bowel  06/25/2013    CLINICAL DATA:  Frequent small bowel obstruction, Crohn's disease, abdominal pain, prior subtotal colectomy and ileostomy  EXAM: UPPER GI SERIES WITH SMALL BOWEL FOLLOW-THROUGH WITH WATER-SOLUBLE CONTRAST  TECHNIQUE: Combined double contrast and single contrast upper GI series using water-soluble contrast per referring physician request. Subsequently, serial images of the small bowel were obtained including spot views of the terminal ileum.  COMPARISON:  Abdominal radiographs 06/23/2013 and CT abdomen/ pelvis 06/21/2013  FLUOROSCOPY TIME:  2 min 9 seconds  FINDINGS: Normal bowel gas pattern on scout image.  A series of coils are arrayed in a linear fashion in the mid abdomen suggesting prior embolization though patient is unaware of such a prior procedure.  Normal esophageal distention and motility.  No esophageal stricture or obstruction.  No gastroesophageal reflux or persistent intraluminal filling defects.  Stomach distends normally with normal rugal fold pattern.  No mass, ulceration or gastric outlet obstruction.  Ligament of Treitz normal position.  Duodenal C loop grossly normal in course though suboptimally distended and opacified during the exam, no gross abnormality seen.  Normal transit of contrast through small bowel loops to the ileostomy at 1 hr.  Small bowel loops are suboptimally visualized due to use of water-soluble contrast material,  no gross wall thickening or mucosal irregularity identified.  No evidence of stricture or obstruction.  A single loop in the central to right pelvis is slightly larger than remaining loops though contrast passes beyond without obstruction.  Compression views demonstrate normal mucosal folds without focal abnormality or persistent intraluminal filling defect.  IMPRESSION: Normal exam.  No focal bowel abnormalities or evidence of obstruction identified.   Electronically Signed   By: Ulyses Southward M.D.   On: 06/25/2013 13:12    Anti-infectives: Anti-infectives    Start     Dose/Rate Route Frequency Ordered Stop   06/23/13 0600  cefOXitin (MEFOXIN) 1 g in dextrose 5 % 50 mL IVPB  Status:  Discontinued     1 g 100 mL/hr over 30 Minutes Intravenous On call to O.R. 06/22/13 1440 06/22/13 1442   06/22/13 0605  hydroxychloroquine (PLAQUENIL) tablet 200 mg  Status:  Discontinued     200 mg Oral 2 times daily 06/22/13 6384 06/22/13 1432      Assessment Principal Problem:  Recurrent partial SBO (small bowel obstruction)-SBFT negative for obstruction, Active Problems:  CROHN'S DISEASE  Severe protein-calorie malnutrition  Failure to thrive in adult Anemia-hemoglobin stable at 7.9   LOS: 5 days   Plan: Advance to low fiber diet-she will need to stay on this.  Stop TPN.  Hopefully home tomorrow off TPN.   Sheli Dorin J 06/26/2013

## 2013-06-27 LAB — CBC
HCT: 26.2 % — ABNORMAL LOW (ref 36.0–46.0)
HEMOGLOBIN: 8.7 g/dL — AB (ref 12.0–15.0)
MCH: 28.9 pg (ref 26.0–34.0)
MCHC: 33.2 g/dL (ref 30.0–36.0)
MCV: 87 fL (ref 78.0–100.0)
PLATELETS: 196 10*3/uL (ref 150–400)
RBC: 3.01 MIL/uL — ABNORMAL LOW (ref 3.87–5.11)
RDW: 13.7 % (ref 11.5–15.5)
WBC: 5 10*3/uL (ref 4.0–10.5)

## 2013-06-27 LAB — BASIC METABOLIC PANEL
BUN: 20 mg/dL (ref 6–23)
CALCIUM: 8.8 mg/dL (ref 8.4–10.5)
CHLORIDE: 105 meq/L (ref 96–112)
CO2: 23 meq/L (ref 19–32)
Creatinine, Ser: 1 mg/dL (ref 0.50–1.10)
GFR calc Af Amer: 68 mL/min — ABNORMAL LOW (ref >90)
GFR calc non Af Amer: 59 mL/min — ABNORMAL LOW (ref >90)
Glucose, Bld: 90 mg/dL (ref 70–99)
POTASSIUM: 4.1 meq/L (ref 3.7–5.3)
SODIUM: 142 meq/L (ref 137–147)

## 2013-06-27 MED ORDER — HYDROCODONE-ACETAMINOPHEN 5-325 MG PO TABS: 1.0000 | ORAL_TABLET | Freq: Four times a day (QID) | ORAL | Status: DC | PRN

## 2013-06-27 NOTE — Progress Notes (Signed)
Discharge instructions reviewed with patient, questions answered, verbalized understanding.  Rx for pain medication given to patient.  Patient transported by RN via wheelchair to front of hospital to be taken home by sister.  Patient in good condition upon leaving [facility].

## 2013-06-27 NOTE — Progress Notes (Signed)
Agree, can go home, already has appt with Dr Johna Sheriff, will sign off

## 2013-06-27 NOTE — Progress Notes (Signed)
Subjective: TNA  Off last pm, tolerating low fiber diet, good ostomy output.  Objective: Vital signs in last 24 hours: Temp:  [98 F (36.7 C)-98.7 F (37.1 C)] 98.2 F (36.8 C) (02/02 0707) Pulse Rate:  [91-103] 91 (02/02 0707) Resp:  [18-19] 18 (02/02 0707) BP: (94-128)/(54-67) 128/67 mmHg (02/02 0707) SpO2:  [100 %] 100 % (02/02 0707) Last BM Date: 06/26/13 PO not recorded, 450 from ileostomy  TNA  And low fiber diet Afebrile, VSS, BP still down some, but this may be her baseline. Labs OK Intake/Output from previous day: 02/01 0701 - 02/02 0700 In: 30 [I.V.:30] Out: 1450 [Urine:1000; Stool:450] Intake/Output this shift:    General appearance: alert, cooperative and no distress GI: soft, non-tender; bowel sounds normal; no masses,  no organomegaly and good ostomy output  Lab Results:   Recent Labs  06/25/13 0535 06/27/13 0432  WBC 6.1 5.0  HGB 7.9* 8.7*  HCT 24.1* 26.2*  PLT 168 196    BMET  Recent Labs  06/25/13 0535 06/27/13 0432  NA 140 142  K 4.4 4.1  CL 108 105  CO2 21 23  GLUCOSE 113* 90  BUN 22 20  CREATININE 0.78 1.00  CALCIUM 8.7 8.8   PT/INR No results found for this basename: LABPROT, INR,  in the last 72 hours   Recent Labs Lab 06/21/13 1522 06/23/13 0600  AST 23 17  ALT 82* 44*  ALKPHOS 182* 116  BILITOT 0.2* 0.3  PROT 6.8 5.3*  ALBUMIN 3.1* 2.3*     Lipase     Component Value Date/Time   LIPASE 29 06/21/2013 1522     Studies/Results: Dg Ugi W/small Bowel  06/25/2013   CLINICAL DATA:  Frequent small bowel obstruction, Crohn's disease, abdominal pain, prior subtotal colectomy and ileostomy  EXAM: UPPER GI SERIES WITH SMALL BOWEL FOLLOW-THROUGH WITH WATER-SOLUBLE CONTRAST  TECHNIQUE: Combined double contrast and single contrast upper GI series using water-soluble contrast per referring physician request. Subsequently, serial images of the small bowel were obtained including spot views of the terminal ileum.  COMPARISON:   Abdominal radiographs 06/23/2013 and CT abdomen/ pelvis 06/21/2013  FLUOROSCOPY TIME:  2 min 9 seconds  FINDINGS: Normal bowel gas pattern on scout image.  A series of coils are arrayed in a linear fashion in the mid abdomen suggesting prior embolization though patient is unaware of such a prior procedure.  Normal esophageal distention and motility.  No esophageal stricture or obstruction.  No gastroesophageal reflux or persistent intraluminal filling defects.  Stomach distends normally with normal rugal fold pattern.  No mass, ulceration or gastric outlet obstruction.  Ligament of Treitz normal position.  Duodenal C loop grossly normal in course though suboptimally distended and opacified during the exam, no gross abnormality seen.  Normal transit of contrast through small bowel loops to the ileostomy at 1 hr.  Small bowel loops are suboptimally visualized due to use of water-soluble contrast material, no gross wall thickening or mucosal irregularity identified.  No evidence of stricture or obstruction.  A single loop in the central to right pelvis is slightly larger than remaining loops though contrast passes beyond without obstruction.  Compression views demonstrate normal mucosal folds without focal abnormality or persistent intraluminal filling defect.  IMPRESSION: Normal exam.  No focal bowel abnormalities or evidence of obstruction identified.   Electronically Signed   By: Ulyses Southward M.D.   On: 06/25/2013 13:12    Medications: . arformoterol  15 mcg Nebulization BID  . aspirin EC  81 mg Oral Q M,W,F  . budesonide  0.25 mg Nebulization BID  . citalopram  20 mg Oral Daily  . famotidine  20 mg Oral Daily  . feeding supplement (PRO-STAT SUGAR FREE 64)  30 mL Oral Daily  . fluticasone  2 spray Each Nare Daily  . pantoprazole  40 mg Oral Q0600  . sodium chloride  10-40 mL Intracatheter Q12H  . tiotropium  18 mcg Inhalation Daily    Assessment/Plan Recurrent PSBO, 04/16/13, 04/25/13, 05/07/13,  Discharged on full liquids and TNA, readmitted 06/22/13.  Hx colectomy around 2007 at outside facility(Crohn's) with ileostomy  S/p Exploratory laparotomy with LOA, parastomal hernia repair with insertion of biologic mesh Dr. Johna Sheriff 03/16/13  CT guided Trans glueteal pelvic abscess drain placement 03/28/13/hx of multiple abscesses . UGI w SBF 06/25/13:  Normal exam.  No focal bowel abnormalities or evidence of obstruction identified.  Crohn's disease  Severe COPD/hx of pneumonia last year/Home O2  AODM  Anemia  Hypertension  rheumatoid Arthritis   Plan:  She can go home from our standpoint.  She can follow up with Dr. Johna Sheriff, she already has an appointment.  On 07/06/13.  Will see again as needed/  LOS: 6 days    Stefan Markarian 06/27/2013

## 2013-06-27 NOTE — Discharge Summary (Signed)
Physician Discharge Summary  [name] JKK:[id number] DOB: 01-29-1951 DOA: 06/21/2013  PCP: [name], MD  Admit date: 06/21/2013 Discharge date: 06/27/2013  Time spent: 65 minutes  Recommendations for Outpatient Follow-up:  1. Followup with Dr. Johna Sheriff as previously scheduled.  Discharge Diagnoses:  Principal Problem:   SBO (small bowel obstruction) Active Problems:   DIABETES, TYPE 2   HYPERLIPIDEMIA   GERD   CROHN'S DISEASE   Anxiety disorder   Severe protein-calorie malnutrition   Failure to thrive in adult   Abdominal pain   Fever, unspecified   Incarcerated right inguinal hernia   Discharge Condition: Stable and improved  Diet recommendation: Low residue diet/ carb modified diet.  Filed Weights   06/22/13 0538 06/23/13 1310  Weight: 57.425 kg (126 lb 9.6 oz) 56.246 kg (124 lb)    History of present illness:  [name] is an 63 year old patient of Dr. Johna Sheriff with a history of COPD, crohn's disease, diabetes mellitus, anemia, s/p exploratory laparotomy on 03/16/13, previous ileostomy. She was hospitalized 03/27/13-04/06/13 due to a pelvic abscess which required IR drain placement. She was admitted again 11/25-11/26 with n/v and abdominal pain. She was found to have a decreasing intra-abdominal abscess unable to be drained by IR. She improved overnight and was discharged home the following day. She was readmitted 12/1-12/5/14 for similar symptoms and again 05/07/13-05/11/13. At the last admission she had a water soluble UGI with small bowel follow through contrast study was performed on 05/09/13 which showed a partial small bowel obstruction with a transition point in the mid pelvis. Due to recent surgery, it was decided that the patient will be discharged on full liquid diet, TPN which she tolerated well. She was doing quite well and followed up with Dr. Johna Sheriff on 06/03/13 at which time she began eating solid food. It was recommended that she can eat soft  foods which she has been tolerating well. She reports that yesterday morning around 2AM she developed abdominal pain. Location is mid abdomen without radiation. Characterized as sharp, cramping pain. Associated with nausea and vomiting. Modifying factors include; pain medication x2 doses without any relief. She therefore preceded to go to the ED   Hospital Course:  #1 probable small bowel obstruction  Patient had presented with nausea, vomiting and abdominal pain. Patient was noted to be on TPN prior to admission as well as a full liquid diet. Due to concern for small bowel obstruction CT of the abdomen and pelvis was obtained on 06/21/2013 which was concerning for small bowel obstruction. Patient was placed on bowel rest TPN was continued and a surgical consultation obtained. The next day patient's abdominal pain improved and she started passing flatness. KUB done showed improvement. Ileostomy with good output. Patient with no emesis or nausea. Patient had a small bowel follow-through which was unremarkable. Patient improved clinically and was followed throughout the hospitalization by general surgery. Patient's diet was advanced to a low-residue diet per general surgery. TPN was subsequently discontinued per general surgery as patient was tolerating a low-residue diet. Patient did not have any further abdominal pain no nausea no vomiting was discharged home in stable and improved condition on a low residue diet. Patient is to followup with Dr. Johna Sheriff of general surgery as previously scheduled. #2 fever/leukocytosis  Questionable etiology. May be reactive secondary to problem #1. Patient was noted to have low-grade fever during the hospitalization which subsequently resolved. Patient was also noted to have a leukocytosis trended down and had resolved by day of discharge.  Blood cultures x2 with no growth to date. UA was negative. Chest x-ray negative for infiltrate. Patient did not need to be started on  any antibiotics and her fever and leukocytosis had resolved by day of discharge. #3 COPD/chronic respiratory failure on home O2  Stable. Continued on Pulmicort and brovana, Spiriva.  #4 gastroesophageal reflux disease  Continued on home regimen of Pepcid, PPI. #5 malnutrition  Patient on admission was initially on TPN. As patient improved clinically and was started on a low residue diet per general surgery and TPN was discontinued. Patient will followup as outpatient.  #6 depression  Continued on Celexa.  #7 hypertension  During the hospitalization patient was noted to have bouts of hypotension. Blood pressure improved after discontinuation of morphine. Patient's blood pressure improved and she was subsequently resumed back on her home regimen of Cardizem. #8 type 2 diabetes  Stable. Maintained on a sliding scale insulin throughout the hospitalization.  #9 history of rheumatoid arthritis  Stable. Plaquenil was held during her hospitalization. Patient will followup as outpatient.      Procedures: CT abdomen and pelvis 06/21/2013  KUB 06/22/2013, 06/23/2013  Small bowel follow-through 06/25/2013   Consultations: General surgery: Dr. Lindie Spruce 06/22/2013   Discharge Exam: Filed Vitals:   06/27/13 0707  BP: 128/67  Pulse: 91  Temp: 98.2 F (36.8 C)  Resp: 18    General: NAD Cardiovascular: RRR Respiratory: CTAB  Discharge Instructions  Discharge Orders   Future Appointments Provider Department Dept Phone   07/06/2013 2:15 PM Mariella Saa, MD [facility], [place] [phone]   08/11/2013 9:00 AM Storm Frisk, MD [facility] [phone]   Future Orders Complete By Expires   Diet general  As directed    Scheduling Instructions:     Low residue/fiber diet.   Discharge instructions  As directed    Comments:     Follow up with Dr Johna Sheriff as scheduled 07/06/13 Low residue/fiber diet.   Increase activity slowly  As directed        Medication  List    STOP taking these medications       guaiFENesin 600 MG 12 hr tablet  Commonly known as:  MUCINEX     TPN ADULT      TAKE these medications       arformoterol 15 MCG/2ML Nebu  Commonly known as:  BROVANA  Take 15 mcg by nebulization 2 (two) times daily.     aspirin 81 MG tablet  Take 81 mg by mouth every Monday, Wednesday, and Friday.     budesonide 0.25 MG/2ML nebulizer solution  Commonly known as:  PULMICORT  Take 0.25 mg by nebulization 2 (two) times daily.     calcium-vitamin D 500-200 MG-UNIT per tablet  Commonly known as:  OSCAL WITH D  Take 1 tablet by mouth 2 (two) times daily.     celecoxib 200 MG capsule  Commonly known as:  CELEBREX  Take 200 mg by mouth daily.     citalopram 20 MG tablet  Commonly known as:  CELEXA  Take 20 mg by mouth daily.     diltiazem 180 MG 24 hr capsule  Commonly known as:  TIAZAC  Take 180 mg by mouth 2 (two) times daily.     famotidine 20 MG tablet  Commonly known as:  PEPCID  Take 20 mg by mouth 2 (two) times daily.     feeding supplement (PRO-STAT SUGAR FREE 64) Liqd  Take 30 mLs by mouth daily.  fluticasone 50 MCG/ACT nasal spray  Commonly known as:  FLONASE  Place 2 sprays into the nose daily as needed.     FLUTTER Devi  Inhale into the lungs. 2-4 times daily     furosemide 20 MG tablet  Commonly known as:  LASIX  Take 20 mg by mouth 2 (two) times daily. Once or twice daily as needed for swelling     HYDROcodone-acetaminophen 5-325 MG per tablet  Commonly known as:  NORCO/VICODIN  Take 1-2 tablets by mouth every 6 (six) hours as needed for moderate pain or severe pain.     hydroxychloroquine 200 MG tablet  Commonly known as:  PLAQUENIL  Take 200 mg by mouth Twice daily.     IRON PO  Take 1 tablet by mouth every Monday, Wednesday, and Friday.     LORazepam 0.5 MG tablet  Commonly known as:  ATIVAN  Take 0.5 mg by mouth every 8 (eight) hours as needed for anxiety.     omeprazole 40 MG capsule   Commonly known as:  PRILOSEC  Take 40 mg by mouth 2 (two) times daily.     ondansetron 4 MG tablet  Commonly known as:  ZOFRAN  Take 1 tablet (4 mg total) by mouth every 6 (six) hours as needed for nausea.     PROAIR HFA 108 (90 BASE) MCG/ACT inhaler  Generic drug:  albuterol  Inhale 2 puffs into the lungs every 6 (six) hours as needed for wheezing or shortness of breath.     RECLAST 5 MG/100ML Soln injection  Generic drug:  zoledronic acid  Inject 5 mg into the vein once. Once a year in May     rosuvastatin 5 MG tablet  Commonly known as:  CRESTOR  Take 5 mg by mouth every Monday, Wednesday, and Friday.     tiotropium 18 MCG inhalation capsule  Commonly known as:  SPIRIVA  Place 18 mcg into inhaler and inhale daily.     VICTOZA 18 MG/3ML Soln injection  Generic drug:  Liraglutide  Inject 1.8 mg into the skin daily. 1.8 units once daily     Vitamin D 1000 UNITS capsule  Take 1,000 Units by mouth daily.       Allergies  Allergen Reactions  . Esomeprazole Magnesium Other (See Comments)    NEXIUM - reaction > aggravated pt's Crohn's disease  . Shellfish Allergy Shortness Of Breath and Nausea And Vomiting  . Surgical Lubricant Other (See Comments)    Burns skin   . Other Nausea And Vomiting    Beans, Dander/Dust, Peas, Mushrooms  . Peanut-Containing Drug Products Nausea And Vomiting  . Levaquin [Levofloxacin]        Follow-up Information   Follow up with Mariella Saa, MD On 07/06/2013. (f/u as scheduled)    Specialty:  General Surgery   Contact information:   [address] [place] [address] [phone]       Follow up with [name], MD. Schedule an appointment as soon as possible for a visit in 2 weeks.   Specialty:  Internal Medicine   Contact information:   [address] [place] [address] [phone]        The results of significant diagnostics from this hospitalization (including imaging, microbiology,  ancillary and laboratory) are listed below for reference.    Significant Diagnostic Studies: Abd 1 View (kub)  06/22/2013   CLINICAL DATA:  Followup small bowel obstruction  EXAM: ABDOMEN - 1 VIEW  COMPARISON:  CT abdomen and pelvis 06/21/2013  FINDINGS: Right lower quadrant ostomy.  Excreted contrast material within bladder and minimally in right renal collecting system.  Single dilated loop of small bowel in the left pelvis consistent with small bowel obstruction.  No definite bowel wall thickening seen.  Endovascular coils noted right paraspinal at L1-L2.  Osseous structures unremarkable.  IMPRESSION: Persistent dilatation of a small bowel loop in the left pelvis consistent with obstruction.   Electronically Signed   By: Ulyses Southward M.D.   On: 06/22/2013 08:30   Ct Abdomen Pelvis W Contrast  06/21/2013   CLINICAL DATA:  Upper abdominal pain.  EXAM: CT ABDOMEN AND PELVIS WITH CONTRAST  TECHNIQUE: Multidetector CT imaging of the abdomen and pelvis was performed using the standard protocol following bolus administration of intravenous contrast.  CONTRAST:  OMNIPAQUE IOHEXOL 300 MG/ML  SOLN  COMPARISON:  CT of the abdomen and pelvis 05/06/2013.  FINDINGS: Lung Bases: Unremarkable.  Abdomen/Pelvis: The appearance of the liver, gallbladder, pancreas, spleen, bilateral adrenal glands and right kidney is unremarkable. Several sub cm low-attenuation lesions are noted in the left kidney, similar to the prior study; although too small to definitively characterize, these are favored to represent tiny cysts. High density material posterior to the pre-pyloric gastric antrum, presumably embolization coils.  Postoperative changes of total colectomy with right lower quadrant ileostomy and Hartmann's pouch are again noted. There are several areas acute angulation of small bowel, best demonstrated on image 55 of series 2 adjacent to the dilated loop, where there is an acutely angulated loop of bowel, presumably related  to underlying adhesions. There is a small amount a haziness in the small bowel mesentery. Trace volume of ascites. No pneumoperitoneum. No definite lymphadenopathy identified within the abdomen or pelvis. Status post hysterectomy. Previously noted lesion associated with the vaginal cuff is less cystic in appearance and smaller, currently measuring 2.7 x 1.6 cm (image 60 of series 2), presumably some involuting postoperative scar tissue. This does appear intimately associated with multiple adjacent loops of small bowel, suggesting the presence of adhesions adjacent to the vaginal cuff. Urinary bladder is unremarkable in appearance.  Musculoskeletal: There are no aggressive appearing lytic or blastic lesions noted in the visualized portions of the skeleton.  IMPRESSION: 1. 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 There is fecalization of small bowel contents in this region. Despite the presence of dilated loops of small bowel, the more proximal portion of the small bowel does not appear dilated, suggesting against frank obstruction at this time. 2. Small amount of edema in the small bowel mesentery and trace volume of ascites may suggest some active inflammation in this patient with history of Crohn's disease. 3. No pneumoperitoneum. 4. Additional incidental findings, as above, similar prior studies.   Electronically Signed   By: Trudie Reed M.D.   On: 06/21/2013 19:32   Dg Chest Port 1 View  06/22/2013   CLINICAL DATA:  Chest pain and dyspnea.  EXAM: PORTABLE CHEST - 1 VIEW  COMPARISON:  DG CHEST 1V PORT dated 05/07/2013  FINDINGS: DG CHEST 1V PORT dated 12/13/2014The lungs are hyperinflated. The interstitial markings are increased bilaterally. The cardiac silhouette is not enlarged. The pulmonary vascularity is mildly prominent centrally. There is no pleural effusion or  pneumothorax. The right subclavian venous catheter tip lies in the region of the midportion of the SVC. There is severe  degenerative change of the right shoulder which is stable.  IMPRESSION: 1. Increased interstitial markings bilaterally have developed that suggests interstitial edema. This may be of cardiac or noncardiac cause. There is no focal pneumonia. 2. Hyperinflation is stable consistent with COPD.   Electronically Signed   By: David  [place]   On: 06/22/2013 17:16   Dg Abd 2 Views  06/23/2013   CLINICAL DATA:  Mid abdominal pain, small bowel obstruction  EXAM: ABDOMEN - 2 VIEW  COMPARISON:  Abdominal radiograph dated 06/22/2013. CT abdomen pelvis dated 06/21/2013.  FINDINGS: Nonobstructive bowel gas pattern. Prior prominent small bowel loop in the left mid abdomen is not well visualized on the current study. Right lower quadrant ostomy. Surgical sutures overlying the pelvis.  Embolization coils overlying the mid abdomen.  No evidence of free air under the diaphragm on the upright view.  IMPRESSION: No findings to suggest small bowel obstruction.  Right lower quadrant ostomy.  No free air.   Electronically Signed   By: Charline Bills M.D.   On: 06/23/2013 08:24   Dg Ugi W/small Bowel  06/25/2013   CLINICAL DATA:  Frequent small bowel obstruction, Crohn's disease, abdominal pain, prior subtotal colectomy and ileostomy  EXAM: UPPER GI SERIES WITH SMALL BOWEL FOLLOW-THROUGH WITH WATER-SOLUBLE CONTRAST  TECHNIQUE: Combined double contrast and single contrast upper GI series using water-soluble contrast per referring physician request. Subsequently, serial images of the small bowel were obtained including spot views of the terminal ileum.  COMPARISON:  Abdominal radiographs 06/23/2013 and CT abdomen/ pelvis 06/21/2013  FLUOROSCOPY TIME:  2 min 9 seconds  FINDINGS: Normal bowel gas pattern on scout image.  A series of coils are arrayed in a linear fashion in the mid abdomen suggesting prior embolization  though patient is unaware of such a prior procedure.  Normal esophageal distention and motility.  No esophageal stricture or obstruction.  No gastroesophageal reflux or persistent intraluminal filling defects.  Stomach distends normally with normal rugal fold pattern.  No mass, ulceration or gastric outlet obstruction.  Ligament of Treitz normal position.  Duodenal C loop grossly normal in course though suboptimally distended and opacified during the exam, no gross abnormality seen.  Normal transit of contrast through small bowel loops to the ileostomy at 1 hr.  Small bowel loops are suboptimally visualized due to use of water-soluble contrast material, no gross wall thickening or mucosal irregularity identified.  No evidence of stricture or obstruction.  A single loop in the central to right pelvis is slightly larger than remaining loops though contrast passes beyond without obstruction.  Compression views demonstrate normal mucosal folds without focal abnormality or persistent intraluminal filling defect.  IMPRESSION: Normal exam.  No focal bowel abnormalities or evidence of obstruction identified.   Electronically Signed   By: Ulyses Southward M.D.   On: 06/25/2013 13:12    Microbiology: Recent Results (from the past 240 hour(s))  URINE CULTURE     Status: None   Collection Time    06/21/13  5:09 PM      Result Value Range Status   Specimen Description URINE, CLEAN CATCH   Final   Special Requests NONE   Final   Culture  Setup Time     Final   Value: 06/21/2013 18:15     Performed at [facility] Count     Final   Value: >=100,000 COLONIES/ML     Performed at [facility]   Culture     Final  Value: Multiple bacterial morphotypes present, none predominant. Suggest appropriate recollection if clinically indicated.     Performed at [facility]   Report Status 06/23/2013 FINAL   Final  URINE CULTURE     Status: None   Collection Time    06/22/13  2:34 PM       Result Value Range Status   Specimen Description URINE, CLEAN CATCH   Final   Special Requests NONE   Final   Culture  Setup Time     Final   Value: 06/22/2013 20:33     Performed at [facility] Count     Final   Value: >=100,000 COLONIES/ML     Performed at [facility]   Culture     Final   Value: Multiple bacterial morphotypes present, none predominant. Suggest appropriate recollection if clinically indicated.     Performed at [facility]   Report Status 06/23/2013 FINAL   Final  CULTURE, BLOOD (ROUTINE X 2)     Status: None   Collection Time    06/22/13  4:00 PM      Result Value Range Status   Specimen Description BLOOD LEFT ARM   Final   Special Requests BOTTLES DRAWN AEROBIC ONLY 10CC   Final   Culture  Setup Time     Final   Value: 06/22/2013 22:50     Performed at [facility]   Culture     Final   Value:        BLOOD CULTURE RECEIVED NO GROWTH TO DATE CULTURE WILL BE HELD FOR 5 DAYS BEFORE ISSUING A FINAL NEGATIVE REPORT     Performed at [facility]   Report Status PENDING   Incomplete  CULTURE, BLOOD (ROUTINE X 2)     Status: None   Collection Time    06/22/13  4:10 PM      Result Value Range Status   Specimen Description BLOOD LEFT HAND   Final   Special Requests BOTTLES DRAWN AEROBIC ONLY 10CC   Final   Culture  Setup Time     Final   Value: 06/22/2013 22:49     Performed at [facility]   Culture     Final   Value:        BLOOD CULTURE RECEIVED NO GROWTH TO DATE CULTURE WILL BE HELD FOR 5 DAYS BEFORE ISSUING A FINAL NEGATIVE REPORT     Performed at [facility]   Report Status PENDING   Incomplete  MRSA PCR SCREENING     Status: None   Collection Time    06/25/13  9:12 AM      Result Value Range Status   MRSA by PCR NEGATIVE  NEGATIVE Final   Comment:            The GeneXpert MRSA Assay (FDA     approved for NASAL specimens     only), is one component of a     comprehensive MRSA  colonization     surveillance program. It is not     intended to diagnose MRSA     infection nor to guide or     monitor treatment for     MRSA infections.     Labs: Basic Metabolic Panel:  Recent Labs Lab 06/22/13 0805 06/22/13 1231 06/23/13 0600 06/24/13 0405 06/25/13 0535 06/27/13 0432  NA 141  --  139 141 140 142  K 4.6  --  4.3  4.8 4.4 4.1  CL 106  --  106 109 108 105  CO2 24  --  24 24 21 23   GLUCOSE 96  --  109* 111* 113* 90  BUN 20  --  23 19 22 20   CREATININE 0.95  --  0.95 0.79 0.78 1.00  CALCIUM 8.3*  --  7.7* 8.2* 8.7 8.8  MG  --  1.7 1.9  --   --   --   PHOS  --  3.4 4.3  --   --   --    Liver Function Tests:  Recent Labs Lab 06/21/13 1522 06/23/13 0600  AST 23 17  ALT 82* 44*  ALKPHOS 182* 116  BILITOT 0.2* 0.3  PROT 6.8 5.3*  ALBUMIN 3.1* 2.3*    Recent Labs Lab 06/21/13 1522  LIPASE 29   No results found for this basename: AMMONIA,  in the last 168 hours CBC:  Recent Labs Lab 06/21/13 1522 06/22/13 0805 06/23/13 0600 06/25/13 0535 06/27/13 0432  WBC 13.5* 11.8* 13.2* 6.1 5.0  NEUTROABS 9.7*  --  10.1*  --   --   HGB 10.6* 9.4* 7.9* 7.9* 8.7*  HCT 32.4* 30.0* 24.6* 24.1* 26.2*  MCV 88.5 90.9 89.5 89.3 87.0  PLT 224 213 150 168 196   Cardiac Enzymes: No results found for this basename: CKTOTAL, CKMB, CKMBINDEX, TROPONINI,  in the last 168 hours BNP: BNP (last 3 results) No results found for this basename: PROBNP,  in the last 8760 hours CBG:  Recent Labs Lab 06/25/13 0557 06/25/13 1526 06/25/13 2153 06/26/13 0510 06/26/13 0731  GLUCAP 130* 75 130* 102* 100*       Signed:  Harlon Kutner MD [facility] 06/27/2013, 1:39 PM

## 2013-06-28 LAB — CULTURE, BLOOD (ROUTINE X 2)
Culture: NO GROWTH
Culture: NO GROWTH

## 2013-07-06 ENCOUNTER — Ambulatory Visit (INDEPENDENT_AMBULATORY_CARE_PROVIDER_SITE_OTHER): Payer: Medicare Other | Admitting: General Surgery

## 2013-07-06 ENCOUNTER — Telehealth: Payer: Self-pay | Admitting: Critical Care Medicine

## 2013-07-06 ENCOUNTER — Encounter (INDEPENDENT_AMBULATORY_CARE_PROVIDER_SITE_OTHER): Payer: Self-pay | Admitting: General Surgery

## 2013-07-06 ENCOUNTER — Other Ambulatory Visit: Payer: Self-pay

## 2013-07-06 VITALS — BP 126/80 | HR 100 | Temp 98.9°F | Resp 18 | Ht 62.0 in | Wt 113.0 lb

## 2013-07-06 DIAGNOSIS — E46 Unspecified protein-calorie malnutrition: Secondary | ICD-10-CM

## 2013-07-06 DIAGNOSIS — Z09 Encounter for follow-up examination after completed treatment for conditions other than malignant neoplasm: Secondary | ICD-10-CM

## 2013-07-06 DIAGNOSIS — K5090 Crohn's disease, unspecified, without complications: Secondary | ICD-10-CM

## 2013-07-06 MED ORDER — PRO-STAT SUGAR FREE PO LIQD: 30.0000 mL | Freq: Every day | ORAL | Status: DC

## 2013-07-06 MED ORDER — TIOTROPIUM BROMIDE MONOHYDRATE 18 MCG IN CAPS: 18.0000 ug | ORAL_CAPSULE | Freq: Every day | RESPIRATORY_TRACT | Status: DC

## 2013-07-06 NOTE — Patient Instructions (Signed)
Continue with a soft and liquid diet as much protein as possible.

## 2013-07-06 NOTE — Telephone Encounter (Signed)
atc pt na wcb

## 2013-07-06 NOTE — Progress Notes (Signed)
Chief complaint: Followup pelvic abscess and bowel obstruction  History:[name] is an 63 year old severe O2 dependent COPD, crohn's disease, diabetes mellitus, anemia, s/p exploratory laparotomy on 03/16/13 for  small bowel obstruction, previous ileostomy. She was hospitalized 03/27/13-04/06/13 due to a pelvic abscess which required IR drain placement. She was admitted again 11/25-11/26 with n/v and abdominal pain. She was found to have a decreasing intra-abdominal abscess unable to be drained by IR. She improved overnight and was discharged home the following day. She was readmitted 12/1-12/5/14 for similar symptoms and again 05/07/13-05/11/13. At the last admission she had a water soluble UGI with small bowel follow through contrast study was performed on 05/09/13 which showed a partial small bowel obstruction with a transition point in the mid pelvis. Due to recent surgery, it was decided that the patient will be discharged on full liquid diet, TPN which she tolerated well. She was doing quite well and followed up with Dr. Johna Sheriff on 06/03/13 at which time she began eating solid food. It was recommended that she can eat soft foods which she has been tolerating well. She then was hospitalized on January 27 with recurrent abdominal pain and vomiting.  . Due to concern for small bowel obstruction CT of the abdomen and pelvis was obtained on 06/21/2013 which was concerning for small bowel obstruction some kinking of the bowel in the right lower abdomen and some fecalization but no obvious obstruction.. Patient was placed on bowel rest TPN was continued and a surgical consultation obtained. The next day patient's abdominal pain improved and she started passing flatness. KUB done showed improvement. Ileostomy with good output. Patient with no emesis or nausea. Patient had a small bowel follow-through which was unremarkable. Patient improved clinically and was followed throughout the hospitalization by  general surgery. Patient's diet was advanced to a low-residue diet per general surgery. TPN was subsequently discontinued per general surgery as patient was tolerating a low-residue diet. Patient did not have any further abdominal pain no nausea no vomiting was discharged home in stable and improved condition and now returns to the office for followup.  She is doing okay at home. She tolerates a soft and full liquid diet without pain or vomiting but fills up very quickly. Ileostomy is functioning well. Her weight is essentially unchanged but she feels stronger and feels like she has more muscle.   Exam: BP 126/80  Pulse 100  Temp(Src) 98.9 F (37.2 C)  Resp 18  Ht 5\' 2"  (1.575 m)  Wt 113 lb (51.256 kg)  BMI 20.66 kg/m2 General: Thin but alert and otherwise well-appearing African female Abdomen: Nondistended. Well-healed midline incision without hernias.. Mild left lower quadrant tenderness. Ileostomy not examined.  Assessment and plan: Complex patient with history of Crohn's disease status post total abdominal colectomy and ileostomy remotely and laparotomy for incarcerated ventral hernia and small bowel obstruction this fall. 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 This appears to have subsequently resolved. She still has some degree of food intolerance and possibly some partial small bowel obstruction but did have an essentially negative small bowel follow-through at the end of her last hospitalization. No indication for surgery at the present which would be very high risk if it were necessary. She will continue a soft and low residue diet with protein supplements. She is to call for any concerns otherwise I will see her back in 2 months.

## 2013-07-07 NOTE — Telephone Encounter (Signed)
LMTC x 1  

## 2013-07-07 NOTE — Telephone Encounter (Signed)
Pt is aware of PW's rec.

## 2013-07-07 NOTE — Telephone Encounter (Signed)
Pt states that Mucinex was not on her list when D/C ed from hospital on 06/28/13.  She states that she was taking this bid prior to this.  Please advise if she should restart Mucinex.

## 2013-07-07 NOTE — Telephone Encounter (Signed)
Yes 600mg  bid

## 2013-07-10 ENCOUNTER — Inpatient Hospital Stay (HOSPITAL_COMMUNITY)
Admission: EM | Admit: 2013-07-10 | Discharge: 2013-07-16 | DRG: 385 | Disposition: A | Discharge status: Home / Self Care | Payer: Medicare Other | Attending: Internal Medicine | Admitting: Internal Medicine

## 2013-07-10 ENCOUNTER — Encounter (HOSPITAL_COMMUNITY): Payer: Self-pay | Admitting: Emergency Medicine

## 2013-07-10 ENCOUNTER — Emergency Department (HOSPITAL_COMMUNITY): Payer: Medicare Other

## 2013-07-10 DIAGNOSIS — E43 Unspecified severe protein-calorie malnutrition: Secondary | ICD-10-CM | POA: Diagnosis present

## 2013-07-10 DIAGNOSIS — Z932 Ileostomy status: Secondary | ICD-10-CM

## 2013-07-10 DIAGNOSIS — Z933 Colostomy status: Secondary | ICD-10-CM

## 2013-07-10 DIAGNOSIS — Z7982 Long term (current) use of aspirin: Secondary | ICD-10-CM

## 2013-07-10 DIAGNOSIS — F419 Anxiety disorder, unspecified: Secondary | ICD-10-CM

## 2013-07-10 DIAGNOSIS — J449 Chronic obstructive pulmonary disease, unspecified: Secondary | ICD-10-CM | POA: Diagnosis present

## 2013-07-10 DIAGNOSIS — I498 Other specified cardiac arrhythmias: Secondary | ICD-10-CM

## 2013-07-10 DIAGNOSIS — F329 Major depressive disorder, single episode, unspecified: Secondary | ICD-10-CM

## 2013-07-10 DIAGNOSIS — R109 Unspecified abdominal pain: Secondary | ICD-10-CM | POA: Diagnosis present

## 2013-07-10 DIAGNOSIS — D649 Anemia, unspecified: Secondary | ICD-10-CM

## 2013-07-10 DIAGNOSIS — K5090 Crohn's disease, unspecified, without complications: Principal | ICD-10-CM | POA: Diagnosis present

## 2013-07-10 DIAGNOSIS — Z0389 Encounter for observation for other suspected diseases and conditions ruled out: Secondary | ICD-10-CM

## 2013-07-10 DIAGNOSIS — R110 Nausea: Secondary | ICD-10-CM

## 2013-07-10 DIAGNOSIS — IMO0001 Reserved for inherently not codable concepts without codable children: Secondary | ICD-10-CM

## 2013-07-10 DIAGNOSIS — N879 Dysplasia of cervix uteri, unspecified: Secondary | ICD-10-CM

## 2013-07-10 DIAGNOSIS — K4030 Unilateral inguinal hernia, with obstruction, without gangrene, not specified as recurrent: Secondary | ICD-10-CM

## 2013-07-10 DIAGNOSIS — Z9049 Acquired absence of other specified parts of digestive tract: Secondary | ICD-10-CM

## 2013-07-10 DIAGNOSIS — K56609 Unspecified intestinal obstruction, unspecified as to partial versus complete obstruction: Secondary | ICD-10-CM

## 2013-07-10 DIAGNOSIS — M069 Rheumatoid arthritis, unspecified: Secondary | ICD-10-CM | POA: Diagnosis present

## 2013-07-10 DIAGNOSIS — Z87891 Personal history of nicotine dependence: Secondary | ICD-10-CM

## 2013-07-10 DIAGNOSIS — F3289 Other specified depressive episodes: Secondary | ICD-10-CM

## 2013-07-10 DIAGNOSIS — F411 Generalized anxiety disorder: Secondary | ICD-10-CM | POA: Diagnosis present

## 2013-07-10 DIAGNOSIS — R627 Adult failure to thrive: Secondary | ICD-10-CM

## 2013-07-10 DIAGNOSIS — R7989 Other specified abnormal findings of blood chemistry: Secondary | ICD-10-CM

## 2013-07-10 DIAGNOSIS — J438 Other emphysema: Secondary | ICD-10-CM

## 2013-07-10 DIAGNOSIS — E119 Type 2 diabetes mellitus without complications: Secondary | ICD-10-CM | POA: Diagnosis present

## 2013-07-10 DIAGNOSIS — K5669 Other intestinal obstruction: Secondary | ICD-10-CM | POA: Diagnosis present

## 2013-07-10 DIAGNOSIS — J4489 Other specified chronic obstructive pulmonary disease: Secondary | ICD-10-CM | POA: Diagnosis present

## 2013-07-10 DIAGNOSIS — IMO0002 Reserved for concepts with insufficient information to code with codable children: Secondary | ICD-10-CM

## 2013-07-10 DIAGNOSIS — Z79899 Other long term (current) drug therapy: Secondary | ICD-10-CM

## 2013-07-10 DIAGNOSIS — E785 Hyperlipidemia, unspecified: Secondary | ICD-10-CM | POA: Diagnosis present

## 2013-07-10 DIAGNOSIS — I10 Essential (primary) hypertension: Secondary | ICD-10-CM | POA: Diagnosis present

## 2013-07-10 DIAGNOSIS — M810 Age-related osteoporosis without current pathological fracture: Secondary | ICD-10-CM

## 2013-07-10 DIAGNOSIS — J439 Emphysema, unspecified: Secondary | ICD-10-CM

## 2013-07-10 DIAGNOSIS — R112 Nausea with vomiting, unspecified: Secondary | ICD-10-CM | POA: Diagnosis present

## 2013-07-10 DIAGNOSIS — J383 Other diseases of vocal cords: Secondary | ICD-10-CM

## 2013-07-10 DIAGNOSIS — K5010 Crohn's disease of large intestine without complications: Secondary | ICD-10-CM

## 2013-07-10 DIAGNOSIS — J9610 Chronic respiratory failure, unspecified whether with hypoxia or hypercapnia: Secondary | ICD-10-CM

## 2013-07-10 DIAGNOSIS — Z9981 Dependence on supplemental oxygen: Secondary | ICD-10-CM

## 2013-07-10 DIAGNOSIS — R509 Fever, unspecified: Secondary | ICD-10-CM

## 2013-07-10 DIAGNOSIS — K219 Gastro-esophageal reflux disease without esophagitis: Secondary | ICD-10-CM | POA: Diagnosis present

## 2013-07-10 LAB — URINALYSIS, ROUTINE W REFLEX MICROSCOPIC
GLUCOSE, UA: NEGATIVE mg/dL
HGB URINE DIPSTICK: NEGATIVE
Ketones, ur: 40 mg/dL — AB
Nitrite: NEGATIVE
PH: 5.5 (ref 5.0–8.0)
Protein, ur: 30 mg/dL — AB
SPECIFIC GRAVITY, URINE: 1.023 (ref 1.005–1.030)
Urobilinogen, UA: 0.2 mg/dL (ref 0.0–1.0)

## 2013-07-10 LAB — URINE MICROSCOPIC-ADD ON

## 2013-07-10 LAB — COMPREHENSIVE METABOLIC PANEL
ALBUMIN: 3.3 g/dL — AB (ref 3.5–5.2)
ALT: 10 U/L (ref 0–35)
AST: 12 U/L (ref 0–37)
Alkaline Phosphatase: 113 U/L (ref 39–117)
BUN: 12 mg/dL (ref 6–23)
CALCIUM: 9.6 mg/dL (ref 8.4–10.5)
CO2: 24 mEq/L (ref 19–32)
Chloride: 101 mEq/L (ref 96–112)
Creatinine, Ser: 1.09 mg/dL (ref 0.50–1.10)
GFR calc Af Amer: 61 mL/min — ABNORMAL LOW (ref >90)
GFR calc non Af Amer: 53 mL/min — ABNORMAL LOW (ref >90)
Glucose, Bld: 77 mg/dL (ref 70–99)
Potassium: 4.1 mEq/L (ref 3.7–5.3)
Sodium: 141 mEq/L (ref 137–147)
TOTAL PROTEIN: 7.7 g/dL (ref 6.0–8.3)
Total Bilirubin: 0.3 mg/dL (ref 0.3–1.2)

## 2013-07-10 LAB — CBC WITH DIFFERENTIAL/PLATELET
Basophils Absolute: 0 10*3/uL (ref 0.0–0.1)
Basophils Relative: 0 % (ref 0–1)
EOS PCT: 1 % (ref 0–5)
Eosinophils Absolute: 0.2 10*3/uL (ref 0.0–0.7)
HCT: 34.1 % — ABNORMAL LOW (ref 36.0–46.0)
Hemoglobin: 11.3 g/dL — ABNORMAL LOW (ref 12.0–15.0)
LYMPHS PCT: 17 % (ref 12–46)
Lymphs Abs: 2.1 10*3/uL (ref 0.7–4.0)
MCH: 28.6 pg (ref 26.0–34.0)
MCHC: 33.1 g/dL (ref 30.0–36.0)
MCV: 86.3 fL (ref 78.0–100.0)
Monocytes Absolute: 0.7 10*3/uL (ref 0.1–1.0)
Monocytes Relative: 6 % (ref 3–12)
NEUTROS PCT: 76 % (ref 43–77)
Neutro Abs: 9.3 10*3/uL — ABNORMAL HIGH (ref 1.7–7.7)
PLATELETS: 369 10*3/uL (ref 150–400)
RBC: 3.95 MIL/uL (ref 3.87–5.11)
RDW: 13.1 % (ref 11.5–15.5)
WBC: 12.2 10*3/uL — AB (ref 4.0–10.5)

## 2013-07-10 LAB — LIPASE, BLOOD: Lipase: 13 U/L (ref 11–59)

## 2013-07-10 LAB — GLUCOSE, CAPILLARY
Glucose-Capillary: 48 mg/dL — ABNORMAL LOW (ref 70–99)
Glucose-Capillary: 94 mg/dL (ref 70–99)
Glucose-Capillary: 86 mg/dL (ref 70–99)
Glucose-Capillary: 97 mg/dL (ref 70–99)
Glucose-Capillary: 89 mg/dL (ref 70–99)

## 2013-07-10 LAB — CBC
HCT: 31.5 % — ABNORMAL LOW (ref 36.0–46.0)
Hemoglobin: 10.3 g/dL — ABNORMAL LOW (ref 12.0–15.0)
MCH: 28.6 pg (ref 26.0–34.0)
MCHC: 32.7 g/dL (ref 30.0–36.0)
MCV: 87.5 fL (ref 78.0–100.0)
PLATELETS: 290 10*3/uL (ref 150–400)
RBC: 3.6 MIL/uL — AB (ref 3.87–5.11)
RDW: 13.3 % (ref 11.5–15.5)
WBC: 10.3 10*3/uL (ref 4.0–10.5)

## 2013-07-10 LAB — CREATININE, SERUM
CREATININE: 0.95 mg/dL (ref 0.50–1.10)
GFR calc non Af Amer: 62 mL/min — ABNORMAL LOW (ref >90)
GFR, EST AFRICAN AMERICAN: 72 mL/min — AB (ref >90)

## 2013-07-10 LAB — HEMOGLOBIN A1C
HEMOGLOBIN A1C: 5.1 % (ref <5.7)
Mean Plasma Glucose: 100 mg/dL (ref <117)

## 2013-07-10 LAB — CG4 I-STAT (LACTIC ACID): Lactic Acid, Venous: 1.35 mmol/L (ref 0.5–2.2)

## 2013-07-10 MED ORDER — INSULIN ASPART 100 UNIT/ML ~~LOC~~ SOLN: 0.0000 [IU] | Freq: Four times a day (QID) | SUBCUTANEOUS | Status: DC

## 2013-07-10 MED ORDER — GUAIFENESIN-DM 100-10 MG/5ML PO SYRP: 5.0000 mL | ORAL_SOLUTION | ORAL | Status: DC | PRN

## 2013-07-10 MED ORDER — ONDANSETRON HCL 4 MG/2ML IJ SOLN
4.0000 mg | Freq: Four times a day (QID) | INTRAMUSCULAR | Status: DC | PRN
  Administered 2013-07-12: 4 mg via INTRAVENOUS
  Filled 2013-07-10: qty 2

## 2013-07-10 MED ORDER — CALCIUM CARBONATE-VITAMIN D 500-200 MG-UNIT PO TABS
1.0000 | ORAL_TABLET | Freq: Two times a day (BID) | ORAL | Status: DC
  Administered 2013-07-10 – 2013-07-16 (×12): 1 via ORAL
  Filled 2013-07-10 (×17): qty 1

## 2013-07-10 MED ORDER — FAMOTIDINE 20 MG PO TABS: 20.0000 mg | ORAL_TABLET | Freq: Two times a day (BID) | ORAL | Status: DC

## 2013-07-10 MED ORDER — ONDANSETRON HCL 4 MG/2ML IJ SOLN
4.0000 mg | Freq: Once | INTRAMUSCULAR | Status: AC
  Administered 2013-07-10: 4 mg via INTRAVENOUS
  Filled 2013-07-10: qty 2

## 2013-07-10 MED ORDER — ONDANSETRON HCL 4 MG PO TABS
4.0000 mg | ORAL_TABLET | Freq: Four times a day (QID) | ORAL | Status: DC | PRN
Start: 2013-07-10 — End: 2013-07-16

## 2013-07-10 MED ORDER — HEPARIN SODIUM (PORCINE) 5000 UNIT/ML IJ SOLN
5000.0000 [IU] | Freq: Three times a day (TID) | INTRAMUSCULAR | Status: DC
  Administered 2013-07-10 – 2013-07-16 (×17): 5000 [IU] via SUBCUTANEOUS
  Filled 2013-07-10 (×27): qty 1

## 2013-07-10 MED ORDER — DEXTROSE 50 % IV SOLN
INTRAVENOUS | Status: AC
  Filled 2013-07-10: qty 50

## 2013-07-10 MED ORDER — TIOTROPIUM BROMIDE MONOHYDRATE 18 MCG IN CAPS
18.0000 ug | ORAL_CAPSULE | Freq: Every day | RESPIRATORY_TRACT | Status: DC
  Administered 2013-07-11 – 2013-07-16 (×6): 18 ug via RESPIRATORY_TRACT
  Filled 2013-07-10 (×2): qty 5

## 2013-07-10 MED ORDER — BUDESONIDE 0.25 MG/2ML IN SUSP
0.2500 mg | Freq: Two times a day (BID) | RESPIRATORY_TRACT | Status: DC
  Administered 2013-07-10 – 2013-07-16 (×11): 0.25 mg via RESPIRATORY_TRACT
  Filled 2013-07-10 (×15): qty 2

## 2013-07-10 MED ORDER — SODIUM CHLORIDE 0.9 % IV SOLN
1000.0000 mL | INTRAVENOUS | Status: DC
  Administered 2013-07-10: 1000 mL via INTRAVENOUS

## 2013-07-10 MED ORDER — PRO-STAT SUGAR FREE PO LIQD
30.0000 mL | Freq: Every day | ORAL | Status: DC
  Administered 2013-07-11 – 2013-07-15 (×4): 30 mL via ORAL
  Filled 2013-07-10 (×6): qty 30

## 2013-07-10 MED ORDER — HYDROMORPHONE HCL PF 1 MG/ML IJ SOLN
1.0000 mg | Freq: Once | INTRAMUSCULAR | Status: AC
  Administered 2013-07-10: 1 mg via INTRAVENOUS
  Filled 2013-07-10: qty 1

## 2013-07-10 MED ORDER — SODIUM CHLORIDE 0.9 % IV SOLN
1000.0000 mL | Freq: Once | INTRAVENOUS | Status: AC
  Administered 2013-07-10: 1000 mL via INTRAVENOUS

## 2013-07-10 MED ORDER — INSULIN ASPART 100 UNIT/ML ~~LOC~~ SOLN: 0.0000 [IU] | SUBCUTANEOUS | Status: DC

## 2013-07-10 MED ORDER — ATORVASTATIN CALCIUM 10 MG PO TABS
10.0000 mg | ORAL_TABLET | Freq: Every day | ORAL | Status: DC
  Administered 2013-07-10 – 2013-07-15 (×6): 10 mg via ORAL
  Filled 2013-07-10 (×8): qty 1

## 2013-07-10 MED ORDER — FLUTICASONE PROPIONATE 50 MCG/ACT NA SUSP
2.0000 | Freq: Every day | NASAL | Status: DC
  Administered 2013-07-11 – 2013-07-16 (×6): 2 via NASAL
  Filled 2013-07-10: qty 16

## 2013-07-10 MED ORDER — PANTOPRAZOLE SODIUM 40 MG PO TBEC
40.0000 mg | DELAYED_RELEASE_TABLET | Freq: Every day | ORAL | Status: DC
  Administered 2013-07-10 – 2013-07-16 (×7): 40 mg via ORAL
  Filled 2013-07-10 (×7): qty 1

## 2013-07-10 MED ORDER — HYDROXYCHLOROQUINE SULFATE 200 MG PO TABS
200.0000 mg | ORAL_TABLET | Freq: Every day | ORAL | Status: DC
  Administered 2013-07-10 – 2013-07-16 (×7): 200 mg via ORAL
  Filled 2013-07-10 (×7): qty 1

## 2013-07-10 MED ORDER — ARFORMOTEROL TARTRATE 15 MCG/2ML IN NEBU
15.0000 ug | INHALATION_SOLUTION | Freq: Two times a day (BID) | RESPIRATORY_TRACT | Status: DC
  Administered 2013-07-10 – 2013-07-16 (×11): 15 ug via RESPIRATORY_TRACT
  Filled 2013-07-10 (×16): qty 2

## 2013-07-10 MED ORDER — CITALOPRAM HYDROBROMIDE 20 MG PO TABS
20.0000 mg | ORAL_TABLET | Freq: Every day | ORAL | Status: DC
  Administered 2013-07-10 – 2013-07-16 (×7): 20 mg via ORAL
  Filled 2013-07-10 (×7): qty 1

## 2013-07-10 MED ORDER — HYDROCODONE-ACETAMINOPHEN 5-325 MG PO TABS
1.0000 | ORAL_TABLET | Freq: Four times a day (QID) | ORAL | Status: DC | PRN
  Administered 2013-07-10 – 2013-07-16 (×11): 2 via ORAL
  Filled 2013-07-10 (×11): qty 2

## 2013-07-10 MED ORDER — GUAIFENESIN ER 600 MG PO TB12
1200.0000 mg | ORAL_TABLET | Freq: Two times a day (BID) | ORAL | Status: DC
  Administered 2013-07-10 – 2013-07-16 (×13): 1200 mg via ORAL
  Filled 2013-07-10 (×15): qty 2

## 2013-07-10 MED ORDER — FAMOTIDINE 20 MG PO TABS
20.0000 mg | ORAL_TABLET | Freq: Every day | ORAL | Status: DC
  Administered 2013-07-10 – 2013-07-16 (×7): 20 mg via ORAL
  Filled 2013-07-10 (×7): qty 1

## 2013-07-10 MED ORDER — ALBUTEROL SULFATE (2.5 MG/3ML) 0.083% IN NEBU: 3.0000 mL | INHALATION_SOLUTION | Freq: Four times a day (QID) | RESPIRATORY_TRACT | Status: DC | PRN

## 2013-07-10 MED ORDER — DILTIAZEM HCL ER BEADS 180 MG PO CP24
180.0000 mg | ORAL_CAPSULE | Freq: Two times a day (BID) | ORAL | Status: DC
  Administered 2013-07-10 – 2013-07-11 (×2): 180 mg via ORAL
  Filled 2013-07-10 (×8): qty 1

## 2013-07-10 MED ORDER — ASPIRIN 81 MG PO CHEW
81.0000 mg | CHEWABLE_TABLET | ORAL | Status: DC
  Administered 2013-07-11 – 2013-07-15 (×3): 81 mg via ORAL
  Filled 2013-07-10 (×4): qty 1

## 2013-07-10 MED ORDER — ALUM & MAG HYDROXIDE-SIMETH 200-200-20 MG/5ML PO SUSP: 30.0000 mL | Freq: Four times a day (QID) | ORAL | Status: DC | PRN

## 2013-07-10 MED ORDER — SODIUM CHLORIDE 0.9 % IV SOLN: INTRAVENOUS | Status: DC

## 2013-07-10 MED ORDER — LORAZEPAM 0.5 MG PO TABS
0.5000 mg | ORAL_TABLET | Freq: Three times a day (TID) | ORAL | Status: DC | PRN
  Administered 2013-07-11 – 2013-07-15 (×6): 0.5 mg via ORAL
  Filled 2013-07-10 (×6): qty 1

## 2013-07-10 MED ORDER — DEXTROSE-NACL 5-0.45 % IV SOLN
INTRAVENOUS | Status: AC
  Administered 2013-07-10: 13:00:00 via INTRAVENOUS

## 2013-07-10 MED ORDER — DEXTROSE 50 % IV SOLN
25.0000 mL | Freq: Once | INTRAVENOUS | Status: AC | PRN
  Administered 2013-07-10: 25 mL via INTRAVENOUS

## 2013-07-10 NOTE — ED Notes (Signed)
Patient presents to ED via [facility]. Patient c/o of diffuse abdominal pain x6 months that has increased in severity in the last 2 days. Also c/o of nausea and vomiting. Abdomen not distended but tender upon palpation.Pt has colostomy.  VSS. A&Ox4 upon arrival to ED. No acute distress noted at this time.

## 2013-07-10 NOTE — ED Provider Notes (Signed)
Patient discussed between myself and Dr. Preston Fleeting. Patient seen by [facility]. Not thought to be a surgical abdomen or acute obstruction. I discussed the case with Dr. per shot of [facility]. I also discussed the case with the GI. Patient will be seen in the emergency room by [facility]. Admitted to the hospitalist team.  Rolland Porter, MD 07/10/13 [phone]

## 2013-07-10 NOTE — ED Notes (Signed)
patient

## 2013-07-10 NOTE — H&P (Signed)
Patient Demographics  [name], is a 64 y.o. female  MRN: [id number]   DOB - 01/31/1951  Admit Date - 07/10/2013  Outpatient Primary MD for the patient is [name], MD GI physician Dr. Reece Agar of [facility]  Rheumatologist Dr. Dierdre Forth   With History of -  Past Medical History  Diagnosis Date  . Other diseases of vocal cords   . Osteoporosis, unspecified   . Other and unspecified hyperlipidemia   . Esophageal reflux   . Chronic airway obstruction, not elsewhere classified   . Regional enteritis of unspecified site   . 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 Crohn's disease     Ileostomy  . Pelvic adhesions   . Anxiety   . Depression   . Hypertension   . Emphysema   . Hematuria   . Severe cervical dysplasia age 67    cone biopsy with neg paps since  . Pneumonia     "3-4 times; necrotizing once" (04/19/2013)  . Asthmatic bronchitis , chronic   . Exertional shortness of breath   . On home oxygen therapy     "24/7; 2.5L @ rest; 3L when I'm doing my chores" (04/19/2013)  . Type II diabetes mellitus   . Anemia   . History of blood transfusion     "I've had 3 or 4" (04/19/2013)  . Rheumatoid arthritis(714.0) 03/2012    [place]; "mild in my hands" (04/19/2013)  . Small bowel obstruction due to adhesions 2014    [place]  . Pelvic abscess in female 2014      Past Surgical History  Procedure Laterality Date  . Ileostomy  2007  . Ear cyst excision Bilateral 1970's    "lobes"   . Cervical cone biopsy  1978  . Colon surgery  2007    subtotal colectomy with ileostomy  . Esophagogastroduodenoscopy (egd) with esophageal dilation      "3 times, I think" (04/19/2013)  . Oophorectomy      LSO  . Tracheostomy  2010  . Hammer toe surgery Bilateral 1980's  . Balloon  dilation  02/10/2012    Procedure: BALLOON DILATION;  Surgeon: Charolett Bumpers, MD;  Location: [facility] ENDOSCOPY;  Service: Endoscopy;  Laterality: N/A;  . Colposcopy    . Flexible sigmoidoscopy  03/26/2012    Procedure: FLEXIBLE SIGMOIDOSCOPY;  Surgeon: Charolett Bumpers, MD;  Location: [facility] ENDOSCOPY;  Service: Endoscopy;  Laterality: N/A;  . Cardiac catheterization  07/08/2007    normal  . Nm myocar perf wall motion  09/21/2006    no ischemia  . Ventral hernia repair N/A 03/16/2013    Procedure: REPAIR OF INCISIONAL AND PARASTOMAL HERNIAS;  Surgeon: Mariella Saa, MD;  Location: [facility] OR;  Service: General;  Laterality: N/A;  . Laparotomy N/A 03/16/2013    Procedure: EXPLORATORY LAPAROTOMY;  Surgeon: Mariella Saa, MD;  Location: [facility] OR;  Service: General;  Laterality: N/A;  . Lysis of adhesion N/A  03/16/2013    Procedure: LYSIS OF ADHESIONS FOR SMALL BOWEL OBSTRUCTION;  Surgeon: Mariella Saa, MD;  Location: [facility] OR;  Service: General;  Laterality: N/A;  . Insertion of mesh N/A 03/16/2013    Procedure: INSERTION OF BIOLOGIC MESH;  Surgeon: Mariella Saa, MD;  Location: [facility] OR;  Service: General;  Laterality: N/A;  . Tee without cardioversion N/A 03/21/2013    Procedure: TRANSESOPHAGEAL ECHOCARDIOGRAM (TEE);  Surgeon: Vesta Mixer, MD;  Location: [facility] ENDOSCOPY;  Service: Cardiovascular;  Laterality: N/A;  . Vaginal hysterectomy  1993    [facility]-LSO  . Tracheostomy closure  2010    in for   Chief Complaint  Patient presents with  . Abdominal Pain     HPI  [name]  is a 63 y.o. female,  with history of Crohn's colitis status post ileostomy, complicated by intra-abdominal abscess formation several months ago requiring repeat surgical intervention, multiple hospital admissions for nausea vomiting due to partial small bowel obstruction and at times unclear etiology, type 2 diabetes mellitus, anxiety, GERD, dyslipidemia, asthma, osteoporosis, severe protein calorie  malnutrition, rheumatoid arthritis.   Patient with the above history was recently admitted several times for nausea vomiting of unclear radiology, at times it was admitted to partial small bowel obstruction and at x2 possible gastroparesis, she was recently discharged few weeks ago for similar problem, who presents to the hospital with epigastric abdominal pain which is nonradiating and constant, worse by eating food better with bowel rest associated with nausea and vomiting, stable output in her ileostomy bag, no blood in vomitus or in stool, says the symptoms have been present for 6 months but acutely got worse over the last 2 days. In the ER abdominal x-ray unremarkable, blood work suggestive of mild leukocytosis, patient appears to be in no distress but complains of some generalized abdominal pain and nausea. I was called to admit the patient.    Review of Systems    In addition to the HPI above,   No Fever-chills, No Headache, No changes with Vision or hearing, No problems swallowing food or Liquids, No Chest pain, Cough or Shortness of Breath, ++ epigastric Abdominal pain & Nausea + Vommitting, Bowel movements are regular in Hartman's pouch, No Blood in stool or Urine, No dysuria, No new skin rashes or bruises, No new joints pains-aches,  No new weakness, tingling, numbness in any extremity, No recent weight gain or loss, No polyuria, polydypsia or polyphagia, No significant Mental Stressors.  A full 10 point Review of Systems was done, except as stated above, all other Review of Systems were negative.   Social History History  Substance Use Topics  . Smoking status: Former Smoker -- 1.00 packs/day for 32 years    Types: Cigarettes    Quit date: 05/26/2004  . Smokeless tobacco: Never Used  . Alcohol Use: 0.0 oz/week     Comment: 04/19/2013 "have a drink a couple times/yr"      Family History Family History  Problem Relation Age of Onset  . Osteoporosis Mother   .  Kidney disease Mother   . Hypertension Sister   . Stroke Sister   . Diabetes Sister       Prior to Admission medications   Medication Sig Start Date End Date Taking? Authorizing Provider  Amino Acids-Protein Hydrolys (FEEDING SUPPLEMENT, PRO-STAT SUGAR FREE 64,) LIQD Take 30 mLs by mouth daily. 07/06/13  Yes Mariella Saa, MD  arformoterol (BROVANA) 15 MCG/2ML NEBU Take 15 mcg by nebulization 2 (two)  times daily.   Yes Historical Provider, MD  aspirin 81 MG tablet Take 81 mg by mouth every Monday, Wednesday, and Friday.    Yes Historical Provider, MD  budesonide (PULMICORT) 0.25 MG/2ML nebulizer solution Take 0.25 mg by nebulization 2 (two) times daily.   Yes Historical Provider, MD  calcium-vitamin D (OSCAL WITH D) 500-200 MG-UNIT per tablet Take 1 tablet by mouth 2 (two) times daily.   Yes Historical Provider, MD  celecoxib (CELEBREX) 200 MG capsule Take 200 mg by mouth daily.   Yes Historical Provider, MD  Cholecalciferol (VITAMIN D) 1000 UNITS capsule Take 1,000 Units by mouth daily.    Yes Historical Provider, MD  citalopram (CELEXA) 20 MG tablet Take 20 mg by mouth daily.    Yes Historical Provider, MD  diltiazem (TIAZAC) 180 MG 24 hr capsule Take 180 mg by mouth 2 (two) times daily.   Yes Historical Provider, MD  famotidine (PEPCID) 20 MG tablet Take 20 mg by mouth 2 (two) times daily.   Yes Historical Provider, MD  fluticasone (FLONASE) 50 MCG/ACT nasal spray Place 2 sprays into the nose daily as needed.    Yes Historical Provider, MD  guaiFENesin (MUCINEX) 600 MG 12 hr tablet Take 1,200 mg by mouth 2 (two) times daily.   Yes Historical Provider, MD  HYDROcodone-acetaminophen (NORCO/VICODIN) 5-325 MG per tablet Take 1-2 tablets by mouth every 6 (six) hours as needed for moderate pain or severe pain. 06/27/13  Yes Rodolph Bong, MD  hydroxychloroquine (PLAQUENIL) 200 MG tablet Take 200 mg by mouth Twice daily.  04/20/12  Yes Historical Provider, MD  IRON PO Take 1 tablet by mouth  every Monday, Wednesday, and Friday.    Yes Historical Provider, MD  Liraglutide (VICTOZA) 18 MG/3ML SOLN Inject 1.8 mg into the skin daily. 1.8 units once daily   Yes Historical Provider, MD  LORazepam (ATIVAN) 0.5 MG tablet Take 0.5 mg by mouth every 8 (eight) hours as needed for anxiety.   Yes Historical Provider, MD  omeprazole (PRILOSEC) 40 MG capsule Take 40 mg by mouth 2 (two) times daily.    Yes Historical Provider, MD  ondansetron (ZOFRAN) 4 MG tablet Take 1 tablet (4 mg total) by mouth every 6 (six) hours as needed for nausea. 04/29/13  Yes Leroy Sea, MD  PROAIR HFA 108 (90 BASE) MCG/ACT inhaler Inhale 2 puffs into the lungs every 6 (six) hours as needed for wheezing or shortness of breath.    Yes Historical Provider, MD  rosuvastatin (CRESTOR) 5 MG tablet Take 5 mg by mouth every Monday, Wednesday, and Friday.    Yes Historical Provider, MD  tiotropium (SPIRIVA) 18 MCG inhalation capsule Place 1 capsule (18 mcg total) into inhaler and inhale daily. 07/06/13  Yes Storm Frisk, MD  Respiratory Therapy Supplies (FLUTTER) DEVI Inhale into the lungs. 2-4 times daily    Historical Provider, MD    Allergies  Allergen Reactions  . Esomeprazole Magnesium Other (See Comments)    NEXIUM - reaction > aggravated pt's Crohn's disease  . Shellfish Allergy Shortness Of Breath and Nausea And Vomiting  . Surgical Lubricant Other (See Comments)    Burns skin   . Other Nausea And Vomiting    Beans, Dander/Dust, Peas, Mushrooms  . Peanut-Containing Drug Products Nausea And Vomiting  . Levaquin [Levofloxacin]     Physical Exam  Vitals  Blood pressure 105/60, pulse 111, temperature 98.8 F (37.1 C), temperature source Oral, resp. rate 11, height 5\' 1"  (1.549 m), weight  49.442 kg (109 lb), SpO2 100.00%.   1. General frail middle aged AA female, lying in bed in NAD,    2. Normal affect and insight, Not Suicidal or Homicidal, Awake Alert, Oriented X 3.  3. No F.N deficits, ALL C.Nerves  Intact, Strength 5/5 all 4 extremities, Sensation intact all 4 extremities, Plantars down going.  4. Ears and Eyes appear Normal, Conjunctivae clear, PERRLA. Moist Oral Mucosa.  5. Supple Neck, No JVD, No cervical lymphadenopathy appriciated, No Carotid Bruits.  6. Symmetrical Chest wall movement, Good air movement bilaterally, CTAB.  7. RRR, No Gallops, Rubs or Murmurs, No Parasternal Heave.  8. Positive Bowel Sounds, Abdomen Soft, Non tender, Hartman's pouch RLQ, No organomegaly appriciated,No rebound -guarding or rigidity.  9.  No Cyanosis, Normal Skin Turgor, No Skin Rash or Bruise.  10. Good muscle tone,  joints appear normal , no effusions, Normal ROM.  11. No Palpable Lymph Nodes in Neck or Axillae     Data Review  CBC  Recent Labs Lab 07/10/13 0328  WBC 12.2*  HGB 11.3*  HCT 34.1*  PLT 369  MCV 86.3  MCH 28.6  MCHC 33.1  RDW 13.1  LYMPHSABS 2.1  MONOABS 0.7  EOSABS 0.2  BASOSABS 0.0   ------------------------------------------------------------------------------------------------------------------  Chemistries   Recent Labs Lab 07/10/13 0328  NA 141  K 4.1  CL 101  CO2 24  GLUCOSE 77  BUN 12  CREATININE 1.09  CALCIUM 9.6  AST 12  ALT 10  ALKPHOS 113  BILITOT 0.3   ------------------------------------------------------------------------------------------------------------------ estimated creatinine clearance is 39.9 ml/min (by C-G formula based on Cr of 1.09). ------------------------------------------------------------------------------------------------------------------ No results found for this basename: TSH, T4TOTAL, FREET3, T3FREE, THYROIDAB,  in the last 72 hours   Coagulation profile No results found for this basename: INR, PROTIME,  in the last 168 hours ------------------------------------------------------------------------------------------------------------------- No results found for this basename: DDIMER,  in the last 72  hours -------------------------------------------------------------------------------------------------------------------  Cardiac Enzymes No results found for this basename: CK, CKMB, TROPONINI, MYOGLOBIN,  in the last 168 hours ------------------------------------------------------------------------------------------------------------------ No components found with this basename: POCBNP,    ---------------------------------------------------------------------------------------------------------------  Urinalysis    Component Value Date/Time   COLORURINE YELLOW 07/10/2013 0528   APPEARANCEUR CLOUDY* 07/10/2013 0528   LABSPEC 1.023 07/10/2013 0528   PHURINE 5.5 07/10/2013 0528   GLUCOSEU NEGATIVE 07/10/2013 0528   HGBUR NEGATIVE 07/10/2013 0528   BILIRUBINUR SMALL* 07/10/2013 0528   KETONESUR 40* 07/10/2013 0528   PROTEINUR 30* 07/10/2013 0528   UROBILINOGEN 0.2 07/10/2013 0528   NITRITE NEGATIVE 07/10/2013 0528   LEUKOCYTESUR SMALL* 07/10/2013 0528    ----------------------------------------------------------------------------------------------------------------  Imaging results:    Dg Abd Acute W/chest  07/10/2013   CLINICAL DATA:  63 year old female with left lower quadrant pain, colostomy. History of total colectomy Nausea and vomiting. Initial encounter. Crohn disease.  EXAM: ACUTE ABDOMEN SERIES (ABDOMEN 2 VIEW & CHEST 1 VIEW)  COMPARISON:  06/23/2013 and earlier.  FINDINGS: Semi upright AP view of the chest at 0358 hrs. Chronic scarring or atelectasis. Stable lung volumes. Normal cardiac size and mediastinal contours. Visualized tracheal air column is within normal limits. No pneumothorax or pneumoperitoneum. Advanced chronic osseous changes at the right shoulder.  No pneumoperitoneum. Stable epigastric embolization coils. Right lower quadrant ostomy re- identified. Gas-filled small bowel in the left abdomen measuring up to 34 mm diameter, similar to prior studies. No other dilated loops  are evident. No acute osseous abnormality identified.  IMPRESSION: 1. No free air. Stable mildly prominent left abdominal small bowel. Right  lower quadrant ostomy. 2.  No acute cardiopulmonary abnormality.   Electronically Signed   By: Augusto Gamble M.D.   On: 07/10/2013 04:17     Dg Ugi W/small Bowel (last admission)  06/25/2013   CLINICAL DATA:  Frequent small bowel obstruction, Crohn's disease, abdominal pain, prior subtotal colectomy and ileostomy  EXAM: UPPER GI SERIES WITH SMALL BOWEL FOLLOW-THROUGH WITH WATER-SOLUBLE CONTRAST  TECHNIQUE: Combined double contrast and single contrast upper GI series using water-soluble contrast per referring physician request. Subsequently, serial images of the small bowel were obtained including spot views of the terminal ileum.  COMPARISON:  Abdominal radiographs 06/23/2013 and CT abdomen/ pelvis 06/21/2013  FLUOROSCOPY TIME:  2 min 9 seconds  FINDINGS: Normal bowel gas pattern on scout image.  A series of coils are arrayed in a linear fashion in the mid abdomen suggesting prior embolization though patient is unaware of such a prior procedure.  Normal esophageal distention and motility.  No esophageal stricture or obstruction.  No gastroesophageal reflux or persistent intraluminal filling defects.  Stomach distends normally with normal rugal fold pattern.  No mass, ulceration or gastric outlet obstruction.  Ligament of Treitz normal position.  Duodenal C loop grossly normal in course though suboptimally distended and opacified during the exam, no gross abnormality seen.  Normal transit of contrast through small bowel loops to the ileostomy at 1 hr.  Small bowel loops are suboptimally visualized due to use of water-soluble contrast material, no gross wall thickening or mucosal irregularity identified.  No evidence of stricture or obstruction.  A single loop in the central to right pelvis is slightly larger than remaining loops though contrast passes beyond without  obstruction.  Compression views demonstrate normal mucosal folds without focal abnormality or persistent intraluminal filling defect.  IMPRESSION: Normal exam.  No focal bowel abnormalities or evidence of obstruction identified.   Electronically Signed   By: Ulyses Southward M.D.   On: 06/25/2013 13:12         Assessment & Plan   1. Recurrent epigastric abdominal pain with nausea vomiting ongoing for 6 months - requiring multiple hospital admissions, she has had partial small bowel obstruction in the past, however x-ray in the ER is unremarkable, she is putting out stool in her ileostomy bag at her baseline level, does not appear to be toxic, abdominal exam is benign. Due to her long-standing history of diabetes mellitus arm 1 order gastric emptying study to rule out diabetic gastroparesis as this seems to be a regularly reoccurring problem for the last 6 months. GI and general surgery have been consulted. Bowel rest with gentle IV fluids and supportive care. We'll try to minimize narcotic intake.     2. Crohn's colitis status post ileostomy. This problem appears to be stable. She is putting out stool in the ileostomy bag, denies any change in her output, for now n.p.o. due to #1, plan as in #1, GI to see.    3. Rheumatoid arthritis. Mostly in her hands. Continue home medications which include Plaquenil. Outpatient followup with Dr. Dierdre Forth post discharge.     4. DM  type II. Check A1c, is n.p.o. scheduled home medications will be held, every 4 hours sliding scale.     5. Severe protein telemetry malnutrition. Once taking oral diet protein supplementation will be resumed.     6. GERD continue PPI.     7. Dyslipidemia continue home dose statin.     8. COPD. No acute issues nebulizer treatments and oxygen as needed. No  wheezing on exam. Uses oxygen at home on as needed basis.      DVT Prophylaxis Heparin   AM Labs Ordered, also please review Full Orders  Family  Communication: Admission, patients condition and plan of care including tests being ordered have been discussed with the patient and sister who indicate understanding and agree with the plan and Code Status.  Code Status Full  Likely DC to  Home  Condition Fair  Time spent in minutes : 35    SINGH,PRASHANT K M.D on 07/10/2013 at 9:50 AM  Between 7am to 7pm - Pager - [phone]  After 7pm go to [email or web address] - password [email or web address]  And look for the night coverage person covering me after hours  [facility] Office  [phone]

## 2013-07-10 NOTE — Consult Note (Signed)
I have seen and examined the pt and agree with PA-Riebocks's progress note. Non specific abd pain. No obstruction seen on KUB Rec PO challenge and see how she does. No surgical plans at this time.

## 2013-07-10 NOTE — Consult Note (Signed)
Reason for Consult: abdominal pain and vomiting  Referring Physician: Dr. Delora Fuel    HPI: [name] is an 63 year old patient of Dr. Excell Seltzer with a history of COPD, crohn's disease, diabetes mellitus, anemia, s/p exploratory laparotomy on 03/16/13, previous ileostomy. She was hospitalized 03/27/13-04/06/13 due to a pelvic abscess which required IR drain placement. She was admitted again 11/25-11/26 with n/v and abdominal pain. She was found to have a decreasing intra-abdominal abscess unable to be drained by IR. She improved overnight and was discharged home the following day. She was readmitted 12/1-12/5/14 for similar symptoms and again 05/07/13-05/11/13.  Her last admission was 06/21/13-06/27/13 for the same complaints.  She had another UGI with SBFT which did not show an obstruction.  She was subsequently discharged home.  She followed up with dr. Excell Seltzer on 07/06/13 at which time she felt very well.  She rports her symptoms began yesterday.  complains of abdominal pain.  She began vomiting last night  x3 episodes.  She reports that it was not bloody or bilious it was clear.  She reports that she has been consuming a low fiber, soft diet with liquids.  Reports "fevers" for temp of 98.  Denies chills.  She reports that her ostomy has been functioning well.   Past Medical History  Diagnosis Date  . Other diseases of vocal cords   . Osteoporosis, unspecified   . Other and unspecified hyperlipidemia   . Esophageal reflux   . Chronic airway obstruction, not elsewhere classified   . Regional enteritis of unspecified site   . 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 Crohn's disease     Ileostomy  . Pelvic adhesions   . Anxiety   . Depression   . Hypertension   . Emphysema   . Hematuria   . Severe cervical dysplasia age 33    cone biopsy with neg paps since  . Pneumonia     "3-4 times; necrotizing once" (04/19/2013)  . Asthmatic bronchitis , chronic   . Exertional  shortness of breath   . On home oxygen therapy     "24/7; 2.5L @ rest; 3L when I'm doing my chores" (04/19/2013)  . Type II diabetes mellitus   . Anemia   . History of blood transfusion     "I've had 3 or 4" (04/19/2013)  . Rheumatoid arthritis(714.0) 03/2012    [place]; "mild in my hands" (04/19/2013)  . Small bowel obstruction due to adhesions 2014    [facility]  . Pelvic abscess in female 2014    Past Surgical History  Procedure Laterality Date  . Ileostomy  2007  . Ear cyst excision Bilateral 1970's    "lobes"   . Cervical cone biopsy  1978  . Colon surgery  2007    subtotal colectomy with ileostomy  . Esophagogastroduodenoscopy (egd) with esophageal dilation      "3 times, I think" (04/19/2013)  . Oophorectomy      LSO  . Tracheostomy  2010  . Hammer toe surgery Bilateral 1980's  . Balloon dilation  02/10/2012    Procedure: BALLOON DILATION;  Surgeon: Garlan Fair, MD;  Location: [facility];  Service: Endoscopy;  Laterality: N/A;  . Colposcopy    . Flexible sigmoidoscopy  03/26/2012    Procedure: FLEXIBLE SIGMOIDOSCOPY;  Surgeon: Garlan Fair, MD;  Location: [facility];  Service: Endoscopy;  Laterality: N/A;  . Cardiac catheterization  07/08/2007    normal  . Nm myocar perf wall motion  09/21/2006  no ischemia  . Ventral hernia repair N/A 03/16/2013    Procedure: REPAIR OF INCISIONAL AND PARASTOMAL HERNIAS;  Surgeon: Edward Jolly, MD;  Location: [place];  Service: General;  Laterality: N/A;  . Laparotomy N/A 03/16/2013    Procedure: EXPLORATORY LAPAROTOMY;  Surgeon: Edward Jolly, MD;  Location: [place];  Service: General;  Laterality: N/A;  . Lysis of adhesion N/A 03/16/2013    Procedure: LYSIS OF ADHESIONS FOR SMALL BOWEL OBSTRUCTION;  Surgeon: Edward Jolly, MD;  Location: [place];  Service: General;  Laterality: N/A;  . Insertion of mesh N/A 03/16/2013    Procedure: INSERTION OF BIOLOGIC MESH;  Surgeon: Edward Jolly, MD;  Location: [place];  Service: General;  Laterality: N/A;  . Tee without cardioversion N/A 03/21/2013    Procedure: TRANSESOPHAGEAL ECHOCARDIOGRAM (TEE);  Surgeon: Thayer Headings, MD;  Location: [place];  Service: Cardiovascular;  Laterality: N/A;  . Vaginal hysterectomy  1993    [facility]-LSO  . Tracheostomy closure  2010    Family History  Problem Relation Age of Onset  . Osteoporosis Mother   . Kidney disease Mother   . Hypertension Sister   . Stroke Sister   . Diabetes Sister     Social History:  reports that she quit smoking about 9 years ago. Her smoking use included Cigarettes. She has a 32 pack-year smoking history. She has never used smokeless tobacco. She reports that she drinks alcohol. She reports that she does not use illicit drugs.  Allergies:  Allergies  Allergen Reactions  . Esomeprazole Magnesium Other (See Comments)    NEXIUM - reaction > aggravated pt's Crohn's disease  . Shellfish Allergy Shortness Of Breath and Nausea And Vomiting  . Surgical Lubricant Other (See Comments)    Burns skin   . Other Nausea And Vomiting    Beans, Dander/Dust, Peas, Mushrooms  . Peanut-Containing Drug Products Nausea And Vomiting  . Levaquin [Levofloxacin]     Medications:  Scheduled Meds:  Continuous Infusions: . sodium chloride     PRN Meds:.  Results for orders placed during the hospital encounter of 07/10/13 (from the past 48 hour(s))  CBC WITH DIFFERENTIAL     Status: Abnormal   Collection Time    07/10/13  3:28 AM      Result Value Ref Range   WBC 12.2 (*) 4.0 - 10.5 K/uL   RBC 3.95  3.87 - 5.11 MIL/uL   Hemoglobin 11.3 (*) 12.0 - 15.0 g/dL   HCT 34.1 (*) 36.0 - 46.0 %   MCV 86.3  78.0 - 100.0 fL   MCH 28.6  26.0 - 34.0 pg   MCHC 33.1  30.0 - 36.0 g/dL   RDW 13.1  11.5 - 15.5 %   Platelets 369  150 - 400 K/uL   Neutrophils Relative % 76  43 - 77 %   Neutro Abs 9.3 (*) 1.7 - 7.7 K/uL   Lymphocytes Relative 17  12 - 46 %   Lymphs Abs 2.1  0.7 - 4.0 K/uL   Monocytes  Relative 6  3 - 12 %   Monocytes Absolute 0.7  0.1 - 1.0 K/uL   Eosinophils Relative 1  0 - 5 %   Eosinophils Absolute 0.2  0.0 - 0.7 K/uL   Basophils Relative 0  0 - 1 %   Basophils Absolute 0.0  0.0 - 0.1 K/uL  COMPREHENSIVE METABOLIC PANEL     Status: Abnormal   Collection Time    07/10/13  3:28 AM      Result Value Ref Range   Sodium 141  137 - 147 mEq/L   Potassium 4.1  3.7 - 5.3 mEq/L   Chloride 101  96 - 112 mEq/L   CO2 24  19 - 32 mEq/L   Glucose, Bld 77  70 - 99 mg/dL   BUN 12  6 - 23 mg/dL   Creatinine, Ser 1.09  0.50 - 1.10 mg/dL   Calcium 9.6  8.4 - 10.5 mg/dL   Total Protein 7.7  6.0 - 8.3 g/dL   Albumin 3.3 (*) 3.5 - 5.2 g/dL   AST 12  0 - 37 U/L   ALT 10  0 - 35 U/L   Alkaline Phosphatase 113  39 - 117 U/L   Total Bilirubin 0.3  0.3 - 1.2 mg/dL   GFR calc non Af Amer 53 (*) >90 mL/min   GFR calc Af Amer 61 (*) >90 mL/min   Comment: (NOTE)     The eGFR has been calculated using the CKD EPI equation.     This calculation has not been validated in all clinical situations.     eGFR's persistently <90 mL/min signify possible Chronic Kidney     Disease.  LIPASE, BLOOD     Status: None   Collection Time    07/10/13  3:28 AM      Result Value Ref Range   Lipase 13  11 - 59 U/L  CG4 I-STAT (LACTIC ACID)     Status: None   Collection Time    07/10/13  3:33 AM      Result Value Ref Range   Lactic Acid, Venous 1.35  0.5 - 2.2 mmol/L  URINALYSIS, ROUTINE W REFLEX MICROSCOPIC     Status: Abnormal   Collection Time    07/10/13  5:28 AM      Result Value Ref Range   Color, Urine YELLOW  YELLOW   APPearance CLOUDY (*) CLEAR   Specific Gravity, Urine 1.023  1.005 - 1.030   pH 5.5  5.0 - 8.0   Glucose, UA NEGATIVE  NEGATIVE mg/dL   Hgb urine dipstick NEGATIVE  NEGATIVE   Bilirubin Urine SMALL (*) NEGATIVE   Ketones, ur 40 (*) NEGATIVE mg/dL   Protein, ur 30 (*) NEGATIVE mg/dL   Urobilinogen, UA 0.2  0.0 - 1.0 mg/dL   Nitrite NEGATIVE  NEGATIVE   Leukocytes, UA  SMALL (*) NEGATIVE  URINE MICROSCOPIC-ADD ON     Status: Abnormal   Collection Time    07/10/13  5:28 AM      Result Value Ref Range   Squamous Epithelial / LPF FEW (*) RARE   WBC, UA 3-6  <3 WBC/hpf   RBC / HPF 0-2  <3 RBC/hpf   Bacteria, UA FEW (*) RARE   Casts HYALINE CASTS (*) NEGATIVE   Urine-Other MUCOUS PRESENT      Dg Abd Acute W/chest  07/10/2013   CLINICAL DATA:  62 year old female with left lower quadrant pain, colostomy. History of total colectomy Nausea and vomiting. Initial encounter. Crohn disease.  EXAM: ACUTE ABDOMEN SERIES (ABDOMEN 2 VIEW & CHEST 1 VIEW)  COMPARISON:  06/23/2013 and earlier.  FINDINGS: Semi upright AP view of the chest at 0358 hrs. Chronic scarring or atelectasis. Stable lung volumes. Normal cardiac size and mediastinal contours. Visualized tracheal air column is within normal limits. No pneumothorax or pneumoperitoneum. Advanced chronic osseous changes at the right shoulder.  No pneumoperitoneum. Stable epigastric embolization coils. Right lower  quadrant ostomy re- identified. Gas-filled small bowel in the left abdomen measuring up to 34 mm diameter, similar to prior studies. No other dilated loops are evident. No acute osseous abnormality identified.  IMPRESSION: 1. No free air. Stable mildly prominent left abdominal small bowel. Right lower quadrant ostomy. 2.  No acute cardiopulmonary abnormality.   Electronically Signed   By: Lars Pinks M.D.   On: 07/10/2013 04:17    Review of Systems  All other systems reviewed and are negative.   Blood pressure 105/60, pulse 111, temperature 98.8 F (37.1 C), temperature source Oral, resp. rate 11, height 5' 1"  (1.549 m), weight 49.442 kg (109 lb), SpO2 100.00%. Physical Exam  Constitutional: She is oriented to person, place, and time. She appears well-developed and well-nourished. No distress.  Neck: Normal range of motion. Neck supple.  Cardiovascular: Normal rate, regular rhythm, normal heart sounds and intact  distal pulses.  Exam reveals no gallop.   No murmur heard. Respiratory: Breath sounds normal. No respiratory distress. She has no wheezes. She has no rales. She exhibits no tenderness.  GI: Soft. Bowel sounds are normal. She exhibits no distension and no mass. There is no rebound and no guarding.  Mild generalized ttp, ostomy with stool, no rebound tenderness of distention.   Neurological: She is alert and oriented to person, place, and time.  Skin: Skin is warm and dry. She is not diaphoretic. No erythema. No pallor.  Psychiatric: She has a normal mood and affect. Her behavior is normal.    Assessment/Plan: Abdominal pain and nausea This is a non surgical abdomen.  She does not appear obstructed.  She may follow up with Dr. Excell Seltzer as outpatient.  Recommend soft diet, adequate oral hydration and ensure.  Lastly, would also recommend a follow up with her gastroenterologist as well.  Further management per internal medicine   Jaeson Molstad ANP-BC 07/10/2013, 10:30 AM

## 2013-07-10 NOTE — Consult Note (Signed)
Subjective:   HPI  The patient is a 63 year old female with a history of Crohn's disease. She underwent a total abdominal colectomy and ileostomy in 2007. Her Crohn's disease has been quiescent and she has not been on any medications or in some time. In October of last year she was admitted to the hospital and was found to have a small bowel obstruction. After conservative therapy for a few days she did not respond and was taken to surgery for an exploratory laparotomy, lysis of adhesions, and this resolved her small bowel obstruction. It was felt that her small bowel obstruction was due to adhesions. There was no mention of any active Crohn's disease. In November a couple of weeks after that surgery she was readmitted to the hospital with a pelvic abscess which required antibiotic therapy and percutaneous drainage. She was subsequently sent home but had 2 other admissions for nausea and vomiting and abdominal pain. These resolved spontaneously. She had a CT scan of the abdomen and pelvis on January 27 of this year and although there were some dilated small towel areas there was no obvious obstruction. On June 24, 2013 she had a small bowel series which was normal. There has been nothing to suggest activity of Crohn's disease on any recent examinations.  She presents to the emergency room again and will be admitted by the hospitalist service because a couple of days ago she started to experience lower abdominal pain again, and yesterday had some vomiting. She states that her ileostomy has been functioning normally throughout all of this.  She has been seen by surgery here in the emergency department and they do not feel this is a surgical problem at this time.  Review of Systems Denies chest pain or shortness of breath  Past Medical History  Diagnosis Date  . Other diseases of vocal cords   . Osteoporosis, unspecified   . Other and unspecified hyperlipidemia   . Esophageal reflux   . Chronic  airway obstruction, not elsewhere classified   . Regional enteritis of unspecified site   . 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 Crohn's disease     Ileostomy  . Pelvic adhesions   . Anxiety   . Depression   . Hypertension   . Emphysema   . Hematuria   . Severe cervical dysplasia age 10    cone biopsy with neg paps since  . Pneumonia     "3-4 times; necrotizing once" (04/19/2013)  . Asthmatic bronchitis , chronic   . Exertional shortness of breath   . On home oxygen therapy     "24/7; 2.5L @ rest; 3L when I'm doing my chores" (04/19/2013)  . Type II diabetes mellitus   . Anemia   . History of blood transfusion     "I've had 3 or 4" (04/19/2013)  . Rheumatoid arthritis(714.0) 03/2012    [place]; "mild in my hands" (04/19/2013)  . Small bowel obstruction due to adhesions 2014    [facility]  . Pelvic abscess in female 2014   Past Surgical History  Procedure Laterality Date  . Ileostomy  2007  . Ear cyst excision Bilateral 1970's    "lobes"   . Cervical cone biopsy  1978  . Colon surgery  2007    subtotal colectomy with ileostomy  . Esophagogastroduodenoscopy (egd) with esophageal dilation      "3 times, I think" (04/19/2013)  . Oophorectomy      LSO  . Tracheostomy  2010  . Hammer toe  surgery Bilateral 1980's  . Balloon dilation  02/10/2012    Procedure: BALLOON DILATION;  Surgeon: Charolett Bumpers, MD;  Location: [facility];  Service: Endoscopy;  Laterality: N/A;  . Colposcopy    . Flexible sigmoidoscopy  03/26/2012    Procedure: FLEXIBLE SIGMOIDOSCOPY;  Surgeon: Charolett Bumpers, MD;  Location: [facility];  Service: Endoscopy;  Laterality: N/A;  . Cardiac catheterization  07/08/2007    normal  . Nm myocar perf wall motion  09/21/2006    no ischemia  . Ventral hernia repair N/A 03/16/2013    Procedure: REPAIR OF INCISIONAL AND PARASTOMAL HERNIAS;  Surgeon: Mariella Saa, MD;  Location: [facility];  Service: General;  Laterality: N/A;  .  Laparotomy N/A 03/16/2013    Procedure: EXPLORATORY LAPAROTOMY;  Surgeon: Mariella Saa, MD;  Location: [facility] OR;  Service: General;  Laterality: N/A;  . Lysis of adhesion N/A 03/16/2013    Procedure: LYSIS OF ADHESIONS FOR SMALL BOWEL OBSTRUCTION;  Surgeon: Mariella Saa, MD;  Location: [facility];  Service: General;  Laterality: N/A;  . Insertion of mesh N/A 03/16/2013    Procedure: INSERTION OF BIOLOGIC MESH;  Surgeon: Mariella Saa, MD;  Location: [facility];  Service: General;  Laterality: N/A;  . Tee without cardioversion N/A 03/21/2013    Procedure: TRANSESOPHAGEAL ECHOCARDIOGRAM (TEE);  Surgeon: Vesta Mixer, MD;  Location: [facility];  Service: Cardiovascular;  Laterality: N/A;  . Vaginal hysterectomy  1993    LAVH-LSO  . Tracheostomy closure  2010   History   Social History  . Marital Status: Divorced    Spouse Name: N/A    Number of Children: N/A  . Years of Education: N/A   Occupational History  . Not on file.   Social History Main Topics  . Smoking status: Former Smoker -- 1.00 packs/day for 32 years    Types: Cigarettes    Quit date: 05/26/2004  . Smokeless tobacco: Never Used  . Alcohol Use: 0.0 oz/week     Comment: 04/19/2013 "have a drink a couple times/yr"  . Drug Use: No  . Sexual Activity: Not Currently    Birth Control/ Protection: Surgical   Other Topics Concern  . Not on file   Social History Narrative  . No narrative on file   family history includes Diabetes in her sister; Hypertension in her sister; Kidney disease in her mother; Osteoporosis in her mother; Stroke in her sister. Current facility-administered medications:albuterol (PROVENTIL) (2.5 MG/3ML) 0.083% nebulizer solution 3 mL, 3 mL, Inhalation, Q6H PRN, Leroy Sea, MD;  alum & mag hydroxide-simeth (MAALOX/MYLANTA) 200-200-20 MG/5ML suspension 30 mL, 30 mL, Oral, Q6H PRN, Leroy Sea, MD;  arformoterol (BROVANA) nebulizer solution 15 mcg, 15 mcg, Nebulization, BID,  Leroy Sea, MD Melene Muller ON 07/11/2013] aspirin chewable tablet 81 mg, 81 mg, Oral, Q M,W,F, Leroy Sea, MD;  atorvastatin (LIPITOR) tablet 10 mg, 10 mg, Oral, q1800, Leroy Sea, MD;  budesonide (PULMICORT) nebulizer solution 0.25 mg, 0.25 mg, Nebulization, BID, Leroy Sea, MD;  calcium-vitamin D (OSCAL WITH D) 500-200 MG-UNIT per tablet 1 tablet, 1 tablet, Oral, BID, Leroy Sea, MD, 1 tablet at 07/10/13 1214 citalopram (CELEXA) tablet 20 mg, 20 mg, Oral, Daily, Leroy Sea, MD, 20 mg at 07/10/13 1214;  dextrose 5 %-0.45 % sodium chloride infusion, , Intravenous, Continuous, Leroy Sea, MD;  diltiazem [facility]) 24 hr capsule 180 mg, 180 mg, Oral, BID, Leroy Sea, MD;  famotidine (PEPCID) tablet 20 mg,  20 mg, Oral, Daily, Leroy SeaPrashant K Singh, MD, 20 mg at 07/10/13 1215 feeding supplement (PRO-STAT SUGAR FREE 64) liquid 30 mL, 30 mL, Oral, Daily, Leroy SeaPrashant K Singh, MD;  fluticasone (FLONASE) 50 MCG/ACT nasal spray 2 spray, 2 spray, Each Nare, Daily, Leroy SeaPrashant K Singh, MD;  guaiFENesin (MUCINEX) 12 hr tablet 1,200 mg, 1,200 mg, Oral, BID, Leroy SeaPrashant K Singh, MD, 1,200 mg at 07/10/13 1214;  guaiFENesin-dextromethorphan (ROBITUSSIN DM) 100-10 MG/5ML syrup 5 mL, 5 mL, Oral, Q4H PRN, Leroy SeaPrashant K Singh, MD heparin injection 5,000 Units, 5,000 Units, Subcutaneous, 3 times per day, Leroy SeaPrashant K Singh, MD;  HYDROcodone-acetaminophen (NORCO/VICODIN) 5-325 MG per tablet 1-2 tablet, 1-2 tablet, Oral, Q6H PRN, Leroy SeaPrashant K Singh, MD, 2 tablet at 07/10/13 1215;  hydroxychloroquine (PLAQUENIL) tablet 200 mg, 200 mg, Oral, Daily, Leroy SeaPrashant K Singh, MD, 200 mg at 07/10/13 1215 insulin aspart (novoLOG) injection 0-9 Units, 0-9 Units, Subcutaneous, Q6H, Leroy SeaPrashant K Singh, MD;  LORazepam (ATIVAN) tablet 0.5 mg, 0.5 mg, Oral, Q8H PRN, Leroy SeaPrashant K Singh, MD;  ondansetron (ZOFRAN) injection 4 mg, 4 mg, Intravenous, Q6H PRN, Leroy SeaPrashant K Singh, MD;  ondansetron (ZOFRAN) tablet 4 mg, 4 mg, Oral, Q6H PRN, Leroy SeaPrashant  K Singh, MD;  pantoprazole (PROTONIX) EC tablet 40 mg, 40 mg, Oral, Daily, Leroy SeaPrashant K Singh, MD, 40 mg at 07/10/13 1215 tiotropium [facility](SPIRIVA) inhalation capsule 18 mcg, 18 mcg, Inhalation, Daily, Leroy SeaPrashant K Singh, MD Allergies  Allergen Reactions  . Esomeprazole Magnesium Other (See Comments)    NEXIUM - reaction > aggravated pt's Crohn's disease  . Shellfish Allergy Shortness Of Breath and Nausea And Vomiting  . Surgical Lubricant Other (See Comments)    Burns skin   . Other Nausea And Vomiting    Beans, Dander/Dust, Peas, Mushrooms  . Peanut-Containing Drug Products Nausea And Vomiting  . Levaquin [Levofloxacin]      Objective:     BP 91/57  Pulse 122  Temp(Src) 98 F (36.7 C) (Oral)  Resp 18  Ht 5\' 1"  (1.549 m)  Wt 49.442 kg (109 lb)  BMI 20.61 kg/m2  SpO2 100%  She is in no acute distress  Nonicteric  Heart regular rhythm no murmurs  Lungs clear  Abdomen: Bowel sounds normal, soft, there is some tenderness to palpation in the left lower quadrant but no rebound or guarding. Ileostomy is on the right side of the abdomen and appears to be functioning well.    Component Value Date/Time   WBC 12.2* 07/10/2013 0328   HGB 11.3* 07/10/2013 0328   HCT 34.1* 07/10/2013 0328   PLT 369 07/10/2013 0328   ALT 10 07/10/2013 0328   AST 12 07/10/2013 0328   NA 141 07/10/2013 0328   K 4.1 07/10/2013 0328   CL 101 07/10/2013 0328   CREATININE 1.09 07/10/2013 0328   CREATININE 1.04 08/27/2012 1054   BUN 12 07/10/2013 0328   CO2 24 07/10/2013 0328   CALCIUM 9.6 07/10/2013 0328   CALCIUM 9.0 07/15/2011 0920   PHOS 4.3 06/23/2013 0600   ALKPHOS 113 07/10/2013 0328      Laboratory No components found with this basename: d1      Assessment:     #1. History of Crohn's disease. This has been quiescent. There is nothing on any specific radiographic examination recently to suggest that there is activity of Crohn's disease going on at this time.  #2. Lower abdominal pain etiology  unclear.  #3. Recent pelvic abscess  #4. Recent exploratory laparotomy in October of last year for small bowel obstruction secondary to  adhesions  #5. Vomiting, etiology unclear      Plan:     It is difficult to explain why she is having these recurrent episodes of abdominal pain and now some vomiting. It was suggested that perhaps she had some gastroparesis in the past. A gastric emptying study will be done. There does not appear to be any evidence of active Crohn's disease at this time. There does not appear to be an obvious small bowel obstruction at this time either. We will follow.  Lab Results  Component Value Date   HGB 11.3* 07/10/2013   HGB 8.7* 06/27/2013   HGB 7.9* 06/25/2013   HCT 34.1* 07/10/2013   HCT 26.2* 06/27/2013   HCT 24.1* 06/25/2013   ALKPHOS 113 07/10/2013   ALKPHOS 116 06/23/2013   ALKPHOS 182* 06/21/2013   AST 12 07/10/2013   AST 17 06/23/2013   AST 23 06/21/2013   ALT 10 07/10/2013   ALT 44* 06/23/2013   ALT 82* 06/21/2013   AMYLASE 183* 07/13/2008   AMYLASE 101 11/26/2007

## 2013-07-10 NOTE — ED Provider Notes (Signed)
CSN: [id number]     Arrival date & time 07/10/13  0248 History   None    Chief Complaint  Patient presents with  . Abdominal Pain     (Consider location/radiation/quality/duration/timing/severity/associated sxs/prior Treatment) Patient is a 63 y.o. female presenting with abdominal pain. The history is provided by the patient.  Abdominal Pain She had onset February 14 crampy left lower quadrant pain with some radiation to the suprapubic area. Pain has been as severe as 10/10 but currently is 7/10. There is associated nausea and vomiting. There is brief improvement in pain, nausea. She has a colostomy which has been draining normally. She denies fever chills or sweats. She states that she's been having problems with recurrent episodes of obstruction as well as several episodes of intra-abdominal abscess and this is all occurred over the last 4 months.  Past Medical History  Diagnosis Date  . Other diseases of vocal cords   . Osteoporosis, unspecified   . Other and unspecified hyperlipidemia   . Esophageal reflux   . Chronic airway obstruction, not elsewhere classified   . Regional enteritis of unspecified site   . 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 Crohn's disease     Ileostomy  . Pelvic adhesions   . Anxiety   . Depression   . Hypertension   . Emphysema   . Hematuria   . Severe cervical dysplasia age 53    cone biopsy with neg paps since  . Pneumonia     "3-4 times; necrotizing once" (04/19/2013)  . Asthmatic bronchitis , chronic   . Exertional shortness of breath   . On home oxygen therapy     "24/7; 2.5L @ rest; 3L when I'm doing my chores" (04/19/2013)  . Type II diabetes mellitus   . Anemia   . History of blood transfusion     "I've had 3 or 4" (04/19/2013)  . Rheumatoid arthritis(714.0) 03/2012    [place]; "mild in my hands" (04/19/2013)  . Small bowel obstruction due to adhesions 2014    [place]  . Pelvic abscess in female 2014   Past  Surgical History  Procedure Laterality Date  . Ileostomy  2007  . Ear cyst excision Bilateral 1970's    "lobes"   . Cervical cone biopsy  1978  . Colon surgery  2007    subtotal colectomy with ileostomy  . Esophagogastroduodenoscopy (egd) with esophageal dilation      "3 times, I think" (04/19/2013)  . Oophorectomy      LSO  . Tracheostomy  2010  . Hammer toe surgery Bilateral 1980's  . Balloon dilation  02/10/2012    Procedure: BALLOON DILATION;  Surgeon: Charolett Bumpers, MD;  Location: [facility];  Service: Endoscopy;  Laterality: N/A;  . Colposcopy    . Flexible sigmoidoscopy  03/26/2012    Procedure: FLEXIBLE SIGMOIDOSCOPY;  Surgeon: Charolett Bumpers, MD;  Location: [facility];  Service: Endoscopy;  Laterality: N/A;  . Cardiac catheterization  07/08/2007    normal  . Nm myocar perf wall motion  09/21/2006    no ischemia  . Ventral hernia repair N/A 03/16/2013    Procedure: REPAIR OF INCISIONAL AND PARASTOMAL HERNIAS;  Surgeon: Mariella Saa, MD;  Location: [facility] OR;  Service: General;  Laterality: N/A;  . Laparotomy N/A 03/16/2013    Procedure: EXPLORATORY LAPAROTOMY;  Surgeon: Mariella Saa, MD;  Location: [facility] OR;  Service: General;  Laterality: N/A;  . Lysis of adhesion N/A 03/16/2013    Procedure:  LYSIS OF ADHESIONS FOR SMALL BOWEL OBSTRUCTION;  Surgeon: Mariella Saa, MD;  Location: [facility] OR;  Service: General;  Laterality: N/A;  . Insertion of mesh N/A 03/16/2013    Procedure: INSERTION OF BIOLOGIC MESH;  Surgeon: Mariella Saa, MD;  Location: [facility] OR;  Service: General;  Laterality: N/A;  . Tee without cardioversion N/A 03/21/2013    Procedure: TRANSESOPHAGEAL ECHOCARDIOGRAM (TEE);  Surgeon: Vesta Mixer, MD;  Location: [facility] ENDOSCOPY;  Service: Cardiovascular;  Laterality: N/A;  . Vaginal hysterectomy  1993    LAVH-LSO  . Tracheostomy closure  2010   Family History  Problem Relation Age of Onset  . Osteoporosis Mother   . Kidney disease Mother   .  Hypertension Sister   . Stroke Sister   . Diabetes Sister    History  Substance Use Topics  . Smoking status: Former Smoker -- 1.00 packs/day for 32 years    Types: Cigarettes    Quit date: 05/26/2004  . Smokeless tobacco: Never Used  . Alcohol Use: 0.0 oz/week     Comment: 04/19/2013 "have a drink a couple times/yr"   OB History   Grav Para Term Preterm Abortions TAB SAB Ect Mult Living   3 0   3     0     Review of Systems  Gastrointestinal: Positive for abdominal pain.  All other systems reviewed and are negative.      Allergies  Esomeprazole magnesium; Shellfish allergy; Surgical lubricant; Other; Peanut-containing drug products; and Levaquin  Home Medications   Current Outpatient Rx  Name  Route  Sig  Dispense  Refill  . Amino Acids-Protein Hydrolys (FEEDING SUPPLEMENT, PRO-STAT SUGAR FREE 64,) LIQD   Oral   Take 30 mLs by mouth daily.   900 mL   0   . arformoterol (BROVANA) 15 MCG/2ML NEBU   Nebulization   Take 15 mcg by nebulization 2 (two) times daily.         [name] aspirin 81 MG tablet   Oral   Take 81 mg by mouth every Monday, Wednesday, and Friday.          . budesonide (PULMICORT) 0.25 MG/2ML nebulizer solution   Nebulization   Take 0.25 mg by nebulization 2 (two) times daily.         . calcium-vitamin D (OSCAL WITH D) 500-200 MG-UNIT per tablet   Oral   Take 1 tablet by mouth 2 (two) times daily.         . celecoxib (CELEBREX) 200 MG capsule   Oral   Take 200 mg by mouth daily.         . Cholecalciferol (VITAMIN D) 1000 UNITS capsule   Oral   Take 1,000 Units by mouth daily.          . citalopram (CELEXA) 20 MG tablet   Oral   Take 20 mg by mouth daily.          [name] diltiazem (CARDIZEM CD) 180 MG 24 hr capsule               . diltiazem (TIAZAC) 180 MG 24 hr capsule   Oral   Take 180 mg by mouth 2 (two) times daily.         . famotidine (PEPCID) 20 MG tablet   Oral   Take 20 mg by mouth 2 (two) times daily.          . fluticasone (FLONASE) 50 MCG/ACT nasal spray   Nasal   Place  2 sprays into the nose daily as needed.          . furosemide (LASIX) 20 MG tablet   Oral   Take 20 mg by mouth 2 (two) times daily. Once or twice daily as needed for swelling         . HYDROcodone-acetaminophen (NORCO/VICODIN) 5-325 MG per tablet   Oral   Take 1-2 tablets by mouth every 6 (six) hours as needed for moderate pain or severe pain.   15 tablet   0   . hydroxychloroquine (PLAQUENIL) 200 MG tablet   Oral   Take 200 mg by mouth Twice daily.          . IRON PO   Oral   Take 1 tablet by mouth every Monday, Wednesday, and Friday.          . Liraglutide (VICTOZA) 18 MG/3ML SOLN   Subcutaneous   Inject 1.8 mg into the skin daily. 1.8 units once daily         . LORazepam (ATIVAN) 0.5 MG tablet   Oral   Take 0.5 mg by mouth every 8 (eight) hours as needed for anxiety.         [name] omeprazole (PRILOSEC) 40 MG capsule   Oral   Take 40 mg by mouth 2 (two) times daily.          . ondansetron (ZOFRAN) 4 MG tablet   Oral   Take 1 tablet (4 mg total) by mouth every 6 (six) hours as needed for nausea.   20 tablet   0   . PROAIR HFA 108 (90 BASE) MCG/ACT inhaler   Inhalation   Inhale 2 puffs into the lungs every 6 (six) hours as needed for wheezing or shortness of breath.          [name] Respiratory Therapy Supplies (FLUTTER) DEVI   Inhalation   Inhale into the lungs. 2-4 times daily         . rosuvastatin (CRESTOR) 5 MG tablet   Oral   Take 5 mg by mouth every Monday, Wednesday, and Friday.          . tiotropium (SPIRIVA) 18 MCG inhalation capsule   Inhalation   Place 1 capsule (18 mcg total) into inhaler and inhale daily.   30 capsule   5   . zoledronic acid (RECLAST) 5 MG/100ML SOLN injection   Intravenous   Inject 5 mg into the vein once. Once a year in May          BP 100/61  Pulse 109  Temp(Src) 98.8 F (37.1 C) (Oral)  Resp 25  Ht 5\' 1"  (1.549 m)  Wt 109 lb (49.442 kg)   BMI 20.61 kg/m2  SpO2 100% Physical Exam  Nursing note and vitals reviewed.  63 year old female, resting comfortably and in no acute distress. Vital signs are significant for tachycardia with heart rate 109, and tachypnea with respiratory rate of 25. Oxygen saturation is 100%, which is normal. Head is normocephalic and atraumatic. PERRLA, EOMI. Oropharynx is clear. Neck is nontender and supple without adenopathy or JVD. Back is nontender and there is no CVA tenderness. Lungs are clear without rales, wheezes, or rhonchi. Chest is nontender. Heart has regular rate and rhythm without murmur. Abdomen is soft, flat, with moderate tenderness in left lower quadrant and mild tenderness throughout the rest of the abdomen. Colostomy is present with normal-appearing drainage. There are no masses or hepatosplenomegaly and peristalsis is hypoactive. Extremities have no cyanosis  or edema, full range of motion is present. Skin is warm and dry without rash. Neurologic: Mental status is normal, cranial nerves are intact, there are no motor or sensory deficits.  ED Course  Procedures (including critical care time) Labs Review Results for orders placed during the hospital encounter of 07/10/13  CBC WITH DIFFERENTIAL      Result Value Ref Range   WBC 12.2 (*) 4.0 - 10.5 K/uL   RBC 3.95  3.87 - 5.11 MIL/uL   Hemoglobin 11.3 (*) 12.0 - 15.0 g/dL   HCT 48.2 (*) 70.7 - 86.7 %   MCV 86.3  78.0 - 100.0 fL   MCH 28.6  26.0 - 34.0 pg   MCHC 33.1  30.0 - 36.0 g/dL   RDW 54.4  92.0 - 10.0 %   Platelets 369  150 - 400 K/uL   Neutrophils Relative % 76  43 - 77 %   Neutro Abs 9.3 (*) 1.7 - 7.7 K/uL   Lymphocytes Relative 17  12 - 46 %   Lymphs Abs 2.1  0.7 - 4.0 K/uL   Monocytes Relative 6  3 - 12 %   Monocytes Absolute 0.7  0.1 - 1.0 K/uL   Eosinophils Relative 1  0 - 5 %   Eosinophils Absolute 0.2  0.0 - 0.7 K/uL   Basophils Relative 0  0 - 1 %   Basophils Absolute 0.0  0.0 - 0.1 K/uL  COMPREHENSIVE  METABOLIC PANEL      Result Value Ref Range   Sodium 141  137 - 147 mEq/L   Potassium 4.1  3.7 - 5.3 mEq/L   Chloride 101  96 - 112 mEq/L   CO2 24  19 - 32 mEq/L   Glucose, Bld 77  70 - 99 mg/dL   BUN 12  6 - 23 mg/dL   Creatinine, Ser 7.12  0.50 - 1.10 mg/dL   Calcium 9.6  8.4 - 19.7 mg/dL   Total Protein 7.7  6.0 - 8.3 g/dL   Albumin 3.3 (*) 3.5 - 5.2 g/dL   AST 12  0 - 37 U/L   ALT 10  0 - 35 U/L   Alkaline Phosphatase 113  39 - 117 U/L   Total Bilirubin 0.3  0.3 - 1.2 mg/dL   GFR calc non Af Amer 53 (*) >90 mL/min   GFR calc Af Amer 61 (*) >90 mL/min  LIPASE, BLOOD      Result Value Ref Range   Lipase 13  11 - 59 U/L  URINALYSIS, ROUTINE W REFLEX MICROSCOPIC      Result Value Ref Range   Color, Urine YELLOW  YELLOW   APPearance CLOUDY (*) CLEAR   Specific Gravity, Urine 1.023  1.005 - 1.030   pH 5.5  5.0 - 8.0   Glucose, UA NEGATIVE  NEGATIVE mg/dL   Hgb urine dipstick NEGATIVE  NEGATIVE   Bilirubin Urine SMALL (*) NEGATIVE   Ketones, ur 40 (*) NEGATIVE mg/dL   Protein, ur 30 (*) NEGATIVE mg/dL   Urobilinogen, UA 0.2  0.0 - 1.0 mg/dL   Nitrite NEGATIVE  NEGATIVE   Leukocytes, UA SMALL (*) NEGATIVE  URINE MICROSCOPIC-ADD ON      Result Value Ref Range   Squamous Epithelial / LPF FEW (*) RARE   WBC, UA 3-6  <3 WBC/hpf   RBC / HPF 0-2  <3 RBC/hpf   Bacteria, UA FEW (*) RARE   Casts HYALINE CASTS (*) NEGATIVE   Urine-Other MUCOUS PRESENT  CG4 I-STAT (LACTIC ACID)      Result Value Ref Range   Lactic Acid, Venous 1.35  0.5 - 2.2 mmol/L   Imaging Review Dg Abd Acute W/chest  07/10/2013   CLINICAL DATA:  63 year old female with left lower quadrant pain, colostomy. History of total colectomy Nausea and vomiting. Initial encounter. Crohn disease.  EXAM: ACUTE ABDOMEN SERIES (ABDOMEN 2 VIEW & CHEST 1 VIEW)  COMPARISON:  06/23/2013 and earlier.  FINDINGS: Semi upright AP view of the chest at 0358 hrs. Chronic scarring or atelectasis. Stable lung volumes. Normal cardiac  size and mediastinal contours. Visualized tracheal air column is within normal limits. No pneumothorax or pneumoperitoneum. Advanced chronic osseous changes at the right shoulder.  No pneumoperitoneum. Stable epigastric embolization coils. Right lower quadrant ostomy re- identified. Gas-filled small bowel in the left abdomen measuring up to 34 mm diameter, similar to prior studies. No other dilated loops are evident. No acute osseous abnormality identified.  IMPRESSION: 1. No free air. Stable mildly prominent left abdominal small bowel. Right lower quadrant ostomy. 2.  No acute cardiopulmonary abnormality.   Electronically Signed   By: Augusto Gamble M.D.   On: 07/10/2013 04:17    MDM   Final diagnoses:  Abdominal pain    Abdominal pain worrisome for recurrent small bowel obstruction. Old records are reviewed and she has had multiple episodes of bowel obstruction as well as intra-abdominal abscess. She's had numerous CT scans recently, so his evaluation will begin with plain film abdominal x-rays. She's given IV fluids as well as hydromorphone for pain in the ondansetron for nausea.  Workup is significant for leukocytosis. Case is discussed with Dr. Andrey Campanile of general surgery service, and he will see her in the ED.  Dione Booze, MD 07/10/13 [id number]

## 2013-07-11 ENCOUNTER — Inpatient Hospital Stay (HOSPITAL_COMMUNITY): Payer: Medicare Other

## 2013-07-11 DIAGNOSIS — F411 Generalized anxiety disorder: Secondary | ICD-10-CM

## 2013-07-11 DIAGNOSIS — R627 Adult failure to thrive: Secondary | ICD-10-CM

## 2013-07-11 LAB — BASIC METABOLIC PANEL
BUN: 5 mg/dL — ABNORMAL LOW (ref 6–23)
CO2: 23 meq/L (ref 19–32)
CREATININE: 0.94 mg/dL (ref 0.50–1.10)
Calcium: 8.7 mg/dL (ref 8.4–10.5)
Chloride: 107 mEq/L (ref 96–112)
GFR calc Af Amer: 73 mL/min — ABNORMAL LOW (ref >90)
GFR calc non Af Amer: 63 mL/min — ABNORMAL LOW (ref >90)
Glucose, Bld: 78 mg/dL (ref 70–99)
Potassium: 4.1 mEq/L (ref 3.7–5.3)
SODIUM: 138 meq/L (ref 137–147)

## 2013-07-11 LAB — GLUCOSE, CAPILLARY
GLUCOSE-CAPILLARY: 84 mg/dL (ref 70–99)
GLUCOSE-CAPILLARY: 78 mg/dL (ref 70–99)
Glucose-Capillary: 105 mg/dL — ABNORMAL HIGH (ref 70–99)
Glucose-Capillary: 79 mg/dL (ref 70–99)
Glucose-Capillary: 82 mg/dL (ref 70–99)
Glucose-Capillary: 90 mg/dL (ref 70–99)

## 2013-07-11 LAB — CBC
HCT: 28.7 % — ABNORMAL LOW (ref 36.0–46.0)
Hemoglobin: 9.3 g/dL — ABNORMAL LOW (ref 12.0–15.0)
MCH: 28.1 pg (ref 26.0–34.0)
MCHC: 32.4 g/dL (ref 30.0–36.0)
MCV: 86.7 fL (ref 78.0–100.0)
Platelets: 242 10*3/uL (ref 150–400)
RBC: 3.31 MIL/uL — AB (ref 3.87–5.11)
RDW: 13.3 % (ref 11.5–15.5)
WBC: 6.3 10*3/uL (ref 4.0–10.5)

## 2013-07-11 MED ORDER — DILTIAZEM HCL ER COATED BEADS 180 MG PO CP24
180.0000 mg | ORAL_CAPSULE | Freq: Two times a day (BID) | ORAL | Status: DC
  Administered 2013-07-11 – 2013-07-16 (×9): 180 mg via ORAL
  Filled 2013-07-11 (×12): qty 1

## 2013-07-11 MED ORDER — TECHNETIUM TC 99M SULFUR COLLOID: 2.0000 | Freq: Once | INTRAVENOUS | Status: AC | PRN

## 2013-07-11 MED ORDER — DEXTROSE-NACL 5-0.45 % IV SOLN
INTRAVENOUS | Status: AC
  Administered 2013-07-11: 12:00:00 via INTRAVENOUS

## 2013-07-11 NOTE — Progress Notes (Signed)
Patient ID: [name], female   DOB: 05-18-1951, 63 y.o.   MRN: [id number]    Subjective: Pt feels ok this morning.  Has good activity from her bag.  No nausea this am.  Objective: Vital signs in last 24 hours: Temp:  [97.7 F (36.5 C)-99 F (37.2 C)] 97.7 F (36.5 C) (02/16 0419) Pulse Rate:  [100-122] 100 (02/16 0419) Resp:  [9-25] 16 (02/16 0419) BP: (91-120)/(56-87) 120/63 mmHg (02/16 0419) SpO2:  [98 %-100 %] 100 % (02/16 0419) Weight:  [126 lb 8.7 oz (57.4 kg)] 126 lb 8.7 oz (57.4 kg) (02/16 0419) Last BM Date: 07/10/13  Intake/Output from previous day: 02/15 0701 - 02/16 0700 In: 47.5 [I.V.:47.5] Out: -  Intake/Output this shift:    PE: Abd: soft, NT, ND, +BS,   Lab Results:   Recent Labs  07/10/13 1620 07/11/13 0757  WBC 10.3 6.3  HGB 10.3* 9.3*  HCT 31.5* 28.7*  PLT 290 242   BMET  Recent Labs  07/10/13 0328 07/10/13 1620 07/11/13 0757  NA 141  --  138  K 4.1  --  4.1  CL 101  --  107  CO2 24  --  23  GLUCOSE 77  --  78  BUN 12  --  5*  CREATININE 1.09 0.95 0.94  CALCIUM 9.6  --  8.7   PT/INR No results found for this basename: LABPROT, INR,  in the last 72 hours CMP     Component Value Date/Time   NA 138 07/11/2013 0757   K 4.1 07/11/2013 0757   CL 107 07/11/2013 0757   CO2 23 07/11/2013 0757   GLUCOSE 78 07/11/2013 0757   BUN 5* 07/11/2013 0757   CREATININE 0.94 07/11/2013 0757   CREATININE 1.04 08/27/2012 1054   CALCIUM 8.7 07/11/2013 0757   CALCIUM 9.0 07/15/2011 0920   PROT 7.7 07/10/2013 0328   ALBUMIN 3.3* 07/10/2013 0328   AST 12 07/10/2013 0328   ALT 10 07/10/2013 0328   ALKPHOS 113 07/10/2013 0328   BILITOT 0.3 07/10/2013 0328   GFRNONAA 63* 07/11/2013 0757   GFRAA 73* 07/11/2013 0757   Lipase     Component Value Date/Time   LIPASE 13 07/10/2013 0328       Studies/Results: Dg Abd Acute W/chest  07/10/2013   CLINICAL DATA:  63 year old female with left lower quadrant pain, colostomy. History of total colectomy Nausea and  vomiting. Initial encounter. Crohn disease.  EXAM: ACUTE ABDOMEN SERIES (ABDOMEN 2 VIEW & CHEST 1 VIEW)  COMPARISON:  06/23/2013 and earlier.  FINDINGS: Semi upright AP view of the chest at 0358 hrs. Chronic scarring or atelectasis. Stable lung volumes. Normal cardiac size and mediastinal contours. Visualized tracheal air column is within normal limits. No pneumothorax or pneumoperitoneum. Advanced chronic osseous changes at the right shoulder.  No pneumoperitoneum. Stable epigastric embolization coils. Right lower quadrant ostomy re- identified. Gas-filled small bowel in the left abdomen measuring up to 34 mm diameter, similar to prior studies. No other dilated loops are evident. No acute osseous abnormality identified.  IMPRESSION: 1. No free air. Stable mildly prominent left abdominal small bowel. Right lower quadrant ostomy. 2.  No acute cardiopulmonary abnormality.   Electronically Signed   By: Augusto Gamble M.D.   On: 07/10/2013 04:17    Anti-infectives: Anti-infectives   Start     Dose/Rate Route Frequency Ordered Stop   07/10/13 1200  hydroxychloroquine (PLAQUENIL) tablet 200 mg     200 mg Oral Daily 07/10/13 1139  Assessment/Plan  1. Nausea and vomiting 2. S/p ex lap with parastomal hernia repair and LOA in Oct 2014  Plan: 1. Agree with GI, that this is a likely a functional issues as opposed to a mechanical issue.  Her ostomy still has good output, with multiple negative SBFTs suggesting no evidence of obstruction.  Suspect she may have some gastroparesis.  Will follow, but no need for surgery.   LOS: 1 day    Lorilyn Laitinen E 07/11/2013, 9:21 AM Pager: [phone]

## 2013-07-11 NOTE — Progress Notes (Signed)
[facility] PROGRESS NOTE  [name] VHQ:[id number] DOB: 10-17-1950 DOA: 07/10/2013 PCP: [name], MD  Assessment/Plan: 63 year old female PMH HTN, COPD, DM, RA, deperssion, anxiety, Crohn's disease s/p total abdominal colectomy and ileostomy in 2007, recent SBO s/p exploratory laparotomy, lysis of adhesions, and this resolved her small bowel obstruction presented with worsenign of   1. Recurrent epigastric abdominal pain with nausea vomiting ongoing for 6 months  -requiring multiple hospital admissions, she has had partial small bowel obstruction in the past -current x ray no SBO; surgery no interventions; ongoing GI eval ? Gastroparesis   2. Crohn's disease s/p total abdominal colectomy and ileostomy in 2007; per GI  3. COPD. No acute issues nebulizer treatments and oxygen as needed. No wheezing on exam. Uses oxygen at home on as needed basis  4. Severe protein telemetry malnutrition. Once taking oral diet protein supplementation will be resumed.  5. DM type II. A1c-5.1, if n.p.o. scheduled home medications will be held, every 4 hours sliding scale  6. Rheumatoid arthritis. Mostly in her hands. Continue home medications which include Plaquenil. Outpatient followup with Dr. Dierdre Forth post discharge.  7. Depression, anxiety; cont home regimen antidepressants   Code Status: fulol Family Communication: d/w patiet (indicate person spoken with, relationship, and if by phone, the number) Disposition Plan: home 24-48 hours    Consultants: GI, surgery   Procedures:  None   Antibiotics:  None  (indicate start date, and stop date if known)  HPI/Subjective: alert  Objective: Filed Vitals:   07/11/13 0419  BP: 120/63  Pulse: 100  Temp: 97.7 F (36.5 C)  Resp: 16    Intake/Output Summary (Last 24 hours) at 07/11/13 1113 Last data filed at 07/10/13 1839  Gross per 24 hour  Intake   47.5 ml  Output      0 ml  Net   47.5 ml   Filed Weights   07/10/13  0304 07/11/13 0419  Weight: 49.442 kg (109 lb) 57.4 kg (126 lb 8.7 oz)    Exam:   General:  alert  Cardiovascular: s1,s2 rrr  Respiratory: no wheezing   Abdomen: soft, colostomy present   Musculoskeletal: no LE edema   Data Reviewed: Basic Metabolic Panel:  Recent Labs Lab 07/10/13 0328 07/10/13 1620 07/11/13 0757  NA 141  --  138  K 4.1  --  4.1  CL 101  --  107  CO2 24  --  23  GLUCOSE 77  --  78  BUN 12  --  5*  CREATININE 1.09 0.95 0.94  CALCIUM 9.6  --  8.7   Liver Function Tests:  Recent Labs Lab 07/10/13 0328  AST 12  ALT 10  ALKPHOS 113  BILITOT 0.3  PROT 7.7  ALBUMIN 3.3*    Recent Labs Lab 07/10/13 0328  LIPASE 13   No results found for this basename: AMMONIA,  in the last 168 hours CBC:  Recent Labs Lab 07/10/13 0328 07/10/13 1620 07/11/13 0757  WBC 12.2* 10.3 6.3  NEUTROABS 9.3*  --   --   HGB 11.3* 10.3* 9.3*  HCT 34.1* 31.5* 28.7*  MCV 86.3 87.5 86.7  PLT 369 290 242   Cardiac Enzymes: No results found for this basename: CKTOTAL, CKMB, CKMBINDEX, TROPONINI,  in the last 168 hours BNP (last 3 results) No results found for this basename: PROBNP,  in the last 8760 hours CBG:  Recent Labs Lab 07/10/13 1622 07/10/13 1948 07/11/13 0022 07/11/13 0419 07/11/13 0845  GLUCAP 97 89 90 82  84    No results found for this or any previous visit (from the past 240 hour(s)).   Studies: Dg Abd Acute W/chest  07/10/2013   CLINICAL DATA:  63 year old female with left lower quadrant pain, colostomy. History of total colectomy Nausea and vomiting. Initial encounter. Crohn disease.  EXAM: ACUTE ABDOMEN SERIES (ABDOMEN 2 VIEW & CHEST 1 VIEW)  COMPARISON:  06/23/2013 and earlier.  FINDINGS: Semi upright AP view of the chest at 0358 hrs. Chronic scarring or atelectasis. Stable lung volumes. Normal cardiac size and mediastinal contours. Visualized tracheal air column is within normal limits. No pneumothorax or pneumoperitoneum. Advanced  chronic osseous changes at the right shoulder.  No pneumoperitoneum. Stable epigastric embolization coils. Right lower quadrant ostomy re- identified. Gas-filled small bowel in the left abdomen measuring up to 34 mm diameter, similar to prior studies. No other dilated loops are evident. No acute osseous abnormality identified.  IMPRESSION: 1. No free air. Stable mildly prominent left abdominal small bowel. Right lower quadrant ostomy. 2.  No acute cardiopulmonary abnormality.   Electronically Signed   By: Augusto Gamble M.D.   On: 07/10/2013 04:17    Scheduled Meds: . arformoterol  15 mcg Nebulization BID  . aspirin  81 mg Oral Q M,W,F  . atorvastatin  10 mg Oral q1800  . budesonide  0.25 mg Nebulization BID  . calcium-vitamin D  1 tablet Oral BID  . citalopram  20 mg Oral Daily  . diltiazem  180 mg Oral BID  . famotidine  20 mg Oral Daily  . feeding supplement (PRO-STAT SUGAR FREE 64)  30 mL Oral Daily  . fluticasone  2 spray Each Nare Daily  . guaiFENesin  1,200 mg Oral BID  . heparin  5,000 Units Subcutaneous 3 times per day  . hydroxychloroquine  200 mg Oral Daily  . insulin aspart  0-9 Units Subcutaneous Q6H  . pantoprazole  40 mg Oral Daily  . 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 If 7PM-7AM, please contact night-coverage at [email or web address], password [facility] 07/11/2013, 11:13 AM  LOS: 1 day

## 2013-07-11 NOTE — Progress Notes (Signed)
Seen and agree  Nhat Hearne M. Jillann Charette, MD, FACS General, Bariatric, & Minimally Invasive Surgery [facility]  

## 2013-07-11 NOTE — Progress Notes (Signed)
INITIAL NUTRITION ASSESSMENT  DOCUMENTATION CODES Per approved criteria  -Not Applicable   INTERVENTION: Diet advancement per MD Add Pro-stat once daily when diet advanced, provides 15 grams of protein and 100 kcal Provide Snacks BID when diet advanced Add Magic Cup once daily when diet advanced, provides 9 grams of protein and 290 kcal RD to continue to monitor nutrition care plan  NUTRITION DIAGNOSIS: Inadequate oral intake related to nausea and vomiting as evidenced by pt's report and current NPO status.   Goal: Pt to meet >/= 90% of their estimated nutrition needs   Monitor:  Diet advancement, PO intake, Weight trends, labs  Reason for Assessment: Malnutrition Screening Tool, score of 2  63 y.o. female  Admitting Dx: Nausea & vomiting  ASSESSMENT: 63 year old female PMH HTN, COPD, DM, RA, deperssion, anxiety, Crohn's disease s/p total abdominal colectomy and ileostomy in 2007, recent SBO s/p exploratory laparotomy, lysis of adhesions, and this resolved her small bowel obstruction presented with epigastric abdominal pain which is nonradiating and constant, worse by eating food better with bowel rest associated with nausea and vomiting, stable output in her ileostomy bag, no blood in vomitus or in stool, says the symptoms have been present for 6 months but acutely got worse over the last 2 days.   Pt out of room at time of visit. RD familiar with pt from previous admission at which time pt was on cyclic TPN for 12 hours daily. Per MD note, current x-ray no SBO, ?gastroparesis. Per MST, pt reported following a Full Liquid diet PTA.   Height: Ht Readings from Last 1 Encounters:  07/10/13 5\' 1"  (1.549 m)    Weight: Wt Readings from Last 1 Encounters:  07/11/13 126 lb 8.7 oz (57.4 kg)    Ideal Body Weight: 105 lbs  % Ideal Body Weight: 120%  Wt Readings from Last 10 Encounters:  07/11/13 126 lb 8.7 oz (57.4 kg)  07/06/13 113 lb (51.256 kg)  06/23/13 124 lb (56.246 kg)   06/15/13 117 lb (53.071 kg)  06/03/13 115 lb (52.164 kg)  05/11/13 95 lb 3.8 oz (43.2 kg)  04/26/13 110 lb 4.8 oz (50.032 kg)  04/19/13 109 lb 2 oz (49.5 kg)  04/11/13 109 lb (49.442 kg)  03/30/13 126 lb 12.2 oz (57.5 kg)    Usual Body Weight: 126 lbs  % Usual Body Weight: 100%  BMI:  Body mass index is 23.92 kg/(m^2).  Estimated Nutritional Needs: Kcal: 1500-1700 Protein: 70-80 grams Fluid: 1.5-1.7 L/day  Skin: WDL  Diet Order: NPO  EDUCATION NEEDS: -No education needs identified at this time   Intake/Output Summary (Last 24 hours) at 07/11/13 1431 Last data filed at 07/10/13 1839  Gross per 24 hour  Intake      0 ml  Output      0 ml  Net      0 ml    Last BM: 2/16; ileostomy  Labs:   Recent Labs Lab 07/10/13 0328 07/10/13 1620 07/11/13 0757  NA 141  --  138  K 4.1  --  4.1  CL 101  --  107  CO2 24  --  23  BUN 12  --  5*  CREATININE 1.09 0.95 0.94  CALCIUM 9.6  --  8.7  GLUCOSE 77  --  78    CBG (last 3)   Recent Labs  07/11/13 0419 07/11/13 0845 07/11/13 1151  GLUCAP 82 84 79    Scheduled Meds: . arformoterol  15 mcg Nebulization BID  .  aspirin  81 mg Oral Q M,W,F  . atorvastatin  10 mg Oral q1800  . budesonide  0.25 mg Nebulization BID  . calcium-vitamin D  1 tablet Oral BID  . citalopram  20 mg Oral Daily  . diltiazem  180 mg Oral BID  . famotidine  20 mg Oral Daily  . feeding supplement (PRO-STAT SUGAR FREE 64)  30 mL Oral Daily  . fluticasone  2 spray Each Nare Daily  . guaiFENesin  1,200 mg Oral BID  . heparin  5,000 Units Subcutaneous 3 times per day  . hydroxychloroquine  200 mg Oral Daily  . insulin aspart  0-9 Units Subcutaneous Q6H  . pantoprazole  40 mg Oral Daily  . tiotropium  18 mcg Inhalation Daily    Continuous Infusions: . dextrose 5 % and 0.45% NaCl 50 mL/hr at 07/11/13 1203    Past Medical History  Diagnosis Date  . Other diseases of vocal cords   . Osteoporosis, unspecified   . Other and unspecified  hyperlipidemia   . Esophageal reflux   . Chronic airway obstruction, not elsewhere classified   . Regional enteritis of unspecified site   . 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 Crohn's disease     Ileostomy  . Pelvic adhesions   . Anxiety   . Depression   . Hypertension   . Emphysema   . Hematuria   . Severe cervical dysplasia age 53    cone biopsy with neg paps since  . Pneumonia     "3-4 times; necrotizing once" (04/19/2013)  . Asthmatic bronchitis , chronic   . Exertional shortness of breath   . On home oxygen therapy     "24/7; 2.5L @ rest; 3L when I'm doing my chores" (04/19/2013)  . Type II diabetes mellitus   . Anemia   . History of blood transfusion     "I've had 3 or 4" (04/19/2013)  . Rheumatoid arthritis(714.0) 03/2012    [place]; "mild in my hands" (04/19/2013)  . Small bowel obstruction due to adhesions 2014    [place]  . Pelvic abscess in female 2014    Past Surgical History  Procedure Laterality Date  . Ileostomy  2007  . Ear cyst excision Bilateral 1970's    "lobes"   . Cervical cone biopsy  1978  . Colon surgery  2007    subtotal colectomy with ileostomy  . Esophagogastroduodenoscopy (egd) with esophageal dilation      "3 times, I think" (04/19/2013)  . Oophorectomy      LSO  . Tracheostomy  2010  . Hammer toe surgery Bilateral 1980's  . Balloon dilation  02/10/2012    Procedure: BALLOON DILATION;  Surgeon: Charolett Bumpers, MD;  Location: [facility] ENDOSCOPY;  Service: Endoscopy;  Laterality: N/A;  . Colposcopy    . Flexible sigmoidoscopy  03/26/2012    Procedure: FLEXIBLE SIGMOIDOSCOPY;  Surgeon: Charolett Bumpers, MD;  Location: [facility] ENDOSCOPY;  Service: Endoscopy;  Laterality: N/A;  . Cardiac catheterization  07/08/2007    normal  . Nm myocar perf wall motion  09/21/2006    no ischemia  . Ventral hernia repair N/A 03/16/2013    Procedure: REPAIR OF INCISIONAL AND PARASTOMAL HERNIAS;  Surgeon: Mariella Saa, MD;  Location: [facility]  OR;  Service: General;  Laterality: N/A;  . Laparotomy N/A 03/16/2013    Procedure: EXPLORATORY LAPAROTOMY;  Surgeon: Mariella Saa, MD;  Location: [facility] OR;  Service: General;  Laterality: N/A;  . Lysis of  adhesion N/A 03/16/2013    Procedure: LYSIS OF ADHESIONS FOR SMALL BOWEL OBSTRUCTION;  Surgeon: Mariella Saa, MD;  Location: [facility] OR;  Service: General;  Laterality: N/A;  . Insertion of mesh N/A 03/16/2013    Procedure: INSERTION OF BIOLOGIC MESH;  Surgeon: Mariella Saa, MD;  Location: [facility] OR;  Service: General;  Laterality: N/A;  . Tee without cardioversion N/A 03/21/2013    Procedure: TRANSESOPHAGEAL ECHOCARDIOGRAM (TEE);  Surgeon: Vesta Mixer, MD;  Location[facility] ENDOSCOPY;  Service: Cardiovascular;  Laterality: N/A;  . Vaginal hysterectomy  1993    [facility]-LSO  . Tracheostomy closure  2010    Ian Malkin RD, LDN Inpatient Clinical Dietitian Pager: [phone] After Hours Pager: [phone]

## 2013-07-12 ENCOUNTER — Inpatient Hospital Stay (HOSPITAL_COMMUNITY): Payer: Medicare Other

## 2013-07-12 ENCOUNTER — Encounter (HOSPITAL_COMMUNITY): Payer: Self-pay | Admitting: Radiology

## 2013-07-12 DIAGNOSIS — J9610 Chronic respiratory failure, unspecified whether with hypoxia or hypercapnia: Secondary | ICD-10-CM

## 2013-07-12 LAB — GLUCOSE, CAPILLARY
GLUCOSE-CAPILLARY: 120 mg/dL — AB (ref 70–99)
GLUCOSE-CAPILLARY: 113 mg/dL — AB (ref 70–99)
GLUCOSE-CAPILLARY: 115 mg/dL — AB (ref 70–99)
Glucose-Capillary: 110 mg/dL — ABNORMAL HIGH (ref 70–99)
Glucose-Capillary: 111 mg/dL — ABNORMAL HIGH (ref 70–99)
Glucose-Capillary: 117 mg/dL — ABNORMAL HIGH (ref 70–99)

## 2013-07-12 MED ORDER — TRAMADOL HCL 50 MG PO TABS
50.0000 mg | ORAL_TABLET | Freq: Four times a day (QID) | ORAL | Status: DC | PRN
  Administered 2013-07-12 – 2013-07-16 (×9): 50 mg via ORAL
  Filled 2013-07-12 (×12): qty 1

## 2013-07-12 MED ORDER — IOHEXOL 300 MG/ML  SOLN
80.0000 mL | Freq: Once | INTRAMUSCULAR | Status: AC | PRN
  Administered 2013-07-12: 80 mL via INTRAVENOUS

## 2013-07-12 MED ORDER — MORPHINE SULFATE 2 MG/ML IJ SOLN
1.0000 mg | Freq: Once | INTRAMUSCULAR | Status: AC
  Administered 2013-07-12: 1 mg via INTRAVENOUS
  Filled 2013-07-12: qty 1

## 2013-07-12 MED ORDER — PROMETHAZINE HCL 25 MG/ML IJ SOLN
6.2500 mg | Freq: Four times a day (QID) | INTRAMUSCULAR | Status: DC | PRN
  Administered 2013-07-12: 6.25 mg via INTRAVENOUS
  Filled 2013-07-12: qty 1

## 2013-07-12 MED ORDER — IOHEXOL 300 MG/ML  SOLN
25.0000 mL | INTRAMUSCULAR | Status: AC
  Administered 2013-07-12 (×2): 25 mL via ORAL

## 2013-07-12 MED ORDER — MORPHINE SULFATE 2 MG/ML IJ SOLN
0.5000 mg | INTRAMUSCULAR | Status: AC | PRN
  Administered 2013-07-12 (×2): 0.5 mg via INTRAVENOUS
  Filled 2013-07-12 (×2): qty 1

## 2013-07-12 MED ORDER — MORPHINE SULFATE 2 MG/ML IJ SOLN
1.0000 mg | Freq: Once | INTRAMUSCULAR | Status: AC
  Administered 2013-07-12: 1 mg via INTRAVENOUS
  Filled 2013-07-12 (×2): qty 1

## 2013-07-12 NOTE — Progress Notes (Signed)
[facility] Progress Note  Subjective: At the time of that visit the patient is not having any abdominal pain. He states however that he is getting lower abdominal pain after eating. Her ostomy is still functioning well.  A repeat CT scan was done today. It shows a persistent inflammatory phlegmon in the pelvic area. This is the area where the previously drained pelvic abscess was.  Objective: Vital signs in last 24 hours: Temp:  [98.9 F (37.2 C)-99.5 F (37.5 C)] 98.9 F (37.2 C) (02/17 0524) Pulse Rate:  [105-111] 111 (02/17 0524) Resp:  [20] 20 (02/17 0524) BP: (104-132)/(52-70) 132/70 mmHg (02/17 0524) SpO2:  [100 %] 100 % (02/17 1048) Weight:  [56.6 kg (124 lb 12.5 oz)] 56.6 kg (124 lb 12.5 oz) (02/17 0524) Weight change: -0.8 kg (-1 lb 12.2 oz)   PE:  She is in no distress  Abdomen soft with some discomfort in the lower abdomen  Lab Results: Results for orders placed during the hospital encounter of 07/10/13 (from the past 24 hour(s))  GLUCOSE, CAPILLARY     Status: Abnormal   Collection Time    07/11/13  8:23 PM      Result Value Ref Range   Glucose-Capillary 105 (*) 70 - 99 mg/dL   Comment 1 Notify RN    GLUCOSE, CAPILLARY     Status: Abnormal   Collection Time    07/12/13 12:20 AM      Result Value Ref Range   Glucose-Capillary 117 (*) 70 - 99 mg/dL   Comment 1 Notify RN    GLUCOSE, CAPILLARY     Status: Abnormal   Collection Time    07/12/13  4:25 AM      Result Value Ref Range   Glucose-Capillary 110 (*) 70 - 99 mg/dL   Comment 1 Notify RN    GLUCOSE, CAPILLARY     Status: Abnormal   Collection Time    07/12/13  7:45 AM      Result Value Ref Range   Glucose-Capillary 120 (*) 70 - 99 mg/dL  GLUCOSE, CAPILLARY     Status: Abnormal   Collection Time    07/12/13 12:03 PM      Result Value Ref Range   Glucose-Capillary 113 (*) 70 - 99 mg/dL  GLUCOSE, CAPILLARY     Status: Abnormal   Collection Time    07/12/13  4:32 PM      Result Value  Ref Range   Glucose-Capillary 111 (*) 70 - 99 mg/dL    Studies/Results: @RISRSLT24 @    Assessment: Lower abdominal pain. Repeat CT of the abdomen shows a persistent inflammatory phlegmon in the pelvis in the area where the previously drained pelvic abscess was. There is mention on the CT of dilated loops of small bowel, but her ileostomy is functioning well. Prior scans and studies have not been suggestive of active Crohn's disease, but the most recent CT suggests that the inflammation could be from this. This inflammatory area however was not present prior to her last surgery, and I don't think that we are dealing with active Crohn's disease.  Plan: Continue conservative management. I would be hesitant to empirically try steroids given the presence of an inflammatory phlegmon in the pelvis. Case discussed with Dr. from surgery. He will review CT scan tomorrow.    Andrey Campanile 07/12/2013, 5:18 PM

## 2013-07-12 NOTE — Progress Notes (Signed)
  Subjective: Still reports abd pain, lower abd. Some nausea. Lost IV access. Pt reports abd pain and n starting about 1 hr after eating and lasting for hours.   Objective: Vital signs in last 24 hours: Temp:  [98.9 F (37.2 C)-99.5 F (37.5 C)] 98.9 F (37.2 C) (02/17 0524) Pulse Rate:  [105-111] 111 (02/17 0524) Resp:  [20] 20 (02/17 0524) BP: (104-132)/(52-70) 132/70 mmHg (02/17 0524) SpO2:  [100 %] 100 % (02/17 1048) Weight:  [124 lb 12.5 oz (56.6 kg)] 124 lb 12.5 oz (56.6 kg) (02/17 0524) Last BM Date: 07/12/13  Intake/Output from previous day: 02/16 0701 - 02/17 0700 In: 980 [P.O.:480; I.V.:500] Out: -  Intake/Output this shift:    Alert, nontoxic Soft, well healed incision. Ostomy in RLQ. Mild TTP.   Lab Results:   Recent Labs  07/10/13 1620 07/11/13 0757  WBC 10.3 6.3  HGB 10.3* 9.3*  HCT 31.5* 28.7*  PLT 290 242   BMET  Recent Labs  07/10/13 0328 07/10/13 1620 07/11/13 0757  NA 141  --  138  K 4.1  --  4.1  CL 101  --  107  CO2 24  --  23  GLUCOSE 77  --  78  BUN 12  --  5*  CREATININE 1.09 0.95 0.94  CALCIUM 9.6  --  8.7   PT/INR No results found for this basename: LABPROT, INR,  in the last 72 hours ABG No results found for this basename: PHART, PCO2, PO2, HCO3,  in the last 72 hours  Studies/Results: Nm Gastric Emptying  07/11/2013   CLINICAL DATA:  Abdominal pain  EXAM: NUCLEAR MEDICINE GASTRIC EMPTYING SCAN  TECHNIQUE: After oral ingestion of radiolabeled meal, sequential abdominal images were obtained for 120 minutes. Residual percentage of activity remaining within the stomach was calculated at 60 and 120 minutes.  COMPARISON:  None.  RADIOPHARMACEUTICALS:  2.0Technetium 99-m labeled sulfur colloid  FINDINGS: Expected location of the stomach in the left upper quadrant. Ingested meal empties the stomach gradually over the course of the study with 50% retention at 60 min and 4.0% retention at 120 min (normal retention less than 30% at a 120  min).  IMPRESSION: Normal gastric emptying study.   Electronically Signed   By: Loralie Champagne M.D.   On: 07/11/2013 16:54    Anti-infectives: Anti-infectives   Start     Dose/Rate Route Frequency Ordered Stop   07/10/13 1200  hydroxychloroquine (PLAQUENIL) tablet 200 mg     200 mg Oral Daily 07/10/13 1139        Assessment/Plan: 1. Nausea and vomiting  2. S/p ex lap with parastomal hernia repair and LOA in Oct 2014  Gastric emptying study nml. Unclear etiology of symptoms. Having ostomy output so doubt obstruction. Unfortunately don't have much to offer - exploratory surgery would be very high risk!  Mary Sella. Andrey Campanile, MD, FACS General, Bariatric, & Minimally Invasive Surgery [facility], [place]   LOS: 2 days    Atilano Ina 07/12/2013

## 2013-07-12 NOTE — Progress Notes (Signed)
[facility] PROGRESS NOTE  [name] IOE:[id number] DOB: May 01, 1951 DOA: 07/10/2013 PCP: [name], MD  Assessment/Plan: 64 year old female PMH HTN, COPD, DM, RA, deperssion, anxiety, Crohn's disease s/p total abdominal colectomy and ileostomy in 2007, recent SBO s/p exploratory laparotomy, lysis of adhesions, and this resolved her small bowel obstruction presented with worsenign of abdominal pain   1. Recurrent epigastric abdominal pain with nausea vomiting ongoing for 6 months  -requiring multiple hospital admissions, h/o partial small bowel obstruction, pelvic abscess which required antibiotic therapy and percutaneous drainage -initial abd xrays, gastric emptying studies are unremarkable; still reports abdominal pian, obtain CT abd/pelvis -? Chronic ischemia, ? CT angio vs scopy; defer to GI, appreciate GI, surgery input   2. Crohn's disease s/p total abdominal colectomy and ileostomy in 2007; per GI  3. COPD. No acute issues nebulizer treatments and oxygen as needed. No wheezing on exam. Uses oxygen at home on as needed basis  4. Severe protein telemetry malnutrition. Resume Po supplements   5. DM type II. A1c-5.1, cont ISS as needed  6. Rheumatoid arthritis. Mostly in her hands. Continue home medications which include Plaquenil. Outpatient followup with Dr. Dierdre Forth post discharge.  7. Depression, anxiety; cont home regimen antidepressants   Code Status: full Family Communication: d/w patiet (indicate person spoken with, relationship, and if by phone, the number) Disposition Plan: home 24-48 hours    Consultants: GI, surgery   Procedures:  None   Antibiotics:  None  (indicate start date, and stop date if known)  HPI/Subjective: alert  Objective: Filed Vitals:   07/12/13 0524  BP: 132/70  Pulse: 111  Temp: 98.9 F (37.2 C)  Resp: 20    Intake/Output Summary (Last 24 hours) at 07/12/13 0841 Last data filed at 07/12/13 0525  Gross per 24  hour  Intake    980 ml  Output      0 ml  Net    980 ml   Filed Weights   07/10/13 0304 07/11/13 0419 07/12/13 0524  Weight: 49.442 kg (109 lb) 57.4 kg (126 lb 8.7 oz) 56.6 kg (124 lb 12.5 oz)    Exam:   General:  alert  Cardiovascular: s1,s2 rrr  Respiratory: no wheezing   Abdomen: soft, colostomy present   Musculoskeletal: no LE edema   Data Reviewed: Basic Metabolic Panel:  Recent Labs Lab 07/10/13 0328 07/10/13 1620 07/11/13 0757  NA 141  --  138  K 4.1  --  4.1  CL 101  --  107  CO2 24  --  23  GLUCOSE 77  --  78  BUN 12  --  5*  CREATININE 1.09 0.95 0.94  CALCIUM 9.6  --  8.7   Liver Function Tests:  Recent Labs Lab 07/10/13 0328  AST 12  ALT 10  ALKPHOS 113  BILITOT 0.3  PROT 7.7  ALBUMIN 3.3*    Recent Labs Lab 07/10/13 0328  LIPASE 13   No results found for this basename: AMMONIA,  in the last 168 hours CBC:  Recent Labs Lab 07/10/13 0328 07/10/13 1620 07/11/13 0757  WBC 12.2* 10.3 6.3  NEUTROABS 9.3*  --   --   HGB 11.3* 10.3* 9.3*  HCT 34.1* 31.5* 28.7*  MCV 86.3 87.5 86.7  PLT 369 290 242   Cardiac Enzymes: No results found for this basename: CKTOTAL, CKMB, CKMBINDEX, TROPONINI,  in the last 168 hours BNP (last 3 results) No results found for this basename: PROBNP,  in the last 8760 hours CBG:  Recent Labs Lab 07/11/13 1648 07/11/13 2023 07/12/13 0020 07/12/13 0425 07/12/13 0745  GLUCAP 78 105* 117* 110* 120*    No results found for this or any previous visit (from the past 240 hour(s)).   Studies: Nm Gastric Emptying  07/11/2013   CLINICAL DATA:  Abdominal pain  EXAM: NUCLEAR MEDICINE GASTRIC EMPTYING SCAN  TECHNIQUE: After oral ingestion of radiolabeled meal, sequential abdominal images were obtained for 120 minutes. Residual percentage of activity remaining within the stomach was calculated at 60 and 120 minutes.  COMPARISON:  None.  RADIOPHARMACEUTICALS:  2.0Technetium 99-m labeled sulfur colloid  FINDINGS:  Expected location of the stomach in the left upper quadrant. Ingested meal empties the stomach gradually over the course of the study with 50% retention at 60 min and 4.0% retention at 120 min (normal retention less than 30% at a 120 min).  IMPRESSION: Normal gastric emptying study.   Electronically Signed   By: Loralie Champagne M.D.   On: 07/11/2013 16:54    Scheduled Meds: . arformoterol  15 mcg Nebulization BID  . aspirin  81 mg Oral Q M,W,F  . atorvastatin  10 mg Oral q1800  . budesonide  0.25 mg Nebulization BID  . calcium-vitamin D  1 tablet Oral BID  . citalopram  20 mg Oral Daily  . diltiazem  180 mg Oral BID  . famotidine  20 mg Oral Daily  . feeding supplement (PRO-STAT SUGAR FREE 64)  30 mL Oral Daily  . fluticasone  2 spray Each Nare Daily  . guaiFENesin  1,200 mg Oral BID  . heparin  5,000 Units Subcutaneous 3 times per day  . hydroxychloroquine  200 mg Oral Daily  . insulin aspart  0-9 Units Subcutaneous Q6H  .  morphine injection  1 mg Intravenous Once  . pantoprazole  40 mg Oral Daily  . 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 If 7PM-7AM, please contact night-coverage at [email or web address], password [facility] 07/12/2013, 8:41 AM  LOS: 2 days

## 2013-07-13 DIAGNOSIS — R112 Nausea with vomiting, unspecified: Secondary | ICD-10-CM

## 2013-07-13 DIAGNOSIS — F329 Major depressive disorder, single episode, unspecified: Secondary | ICD-10-CM

## 2013-07-13 DIAGNOSIS — M069 Rheumatoid arthritis, unspecified: Secondary | ICD-10-CM

## 2013-07-13 DIAGNOSIS — F3289 Other specified depressive episodes: Secondary | ICD-10-CM

## 2013-07-13 LAB — BASIC METABOLIC PANEL
BUN: 3 mg/dL — ABNORMAL LOW (ref 6–23)
CO2: 26 meq/L (ref 19–32)
Calcium: 9 mg/dL (ref 8.4–10.5)
Chloride: 105 mEq/L (ref 96–112)
Creatinine, Ser: 0.95 mg/dL (ref 0.50–1.10)
GFR calc Af Amer: 72 mL/min — ABNORMAL LOW (ref >90)
GFR calc non Af Amer: 62 mL/min — ABNORMAL LOW (ref >90)
Glucose, Bld: 92 mg/dL (ref 70–99)
Potassium: 3.2 mEq/L — ABNORMAL LOW (ref 3.7–5.3)
SODIUM: 141 meq/L (ref 137–147)

## 2013-07-13 LAB — CBC
HCT: 29.3 % — ABNORMAL LOW (ref 36.0–46.0)
Hemoglobin: 9.7 g/dL — ABNORMAL LOW (ref 12.0–15.0)
MCH: 28.1 pg (ref 26.0–34.0)
MCHC: 33.1 g/dL (ref 30.0–36.0)
MCV: 84.9 fL (ref 78.0–100.0)
Platelets: 237 10*3/uL (ref 150–400)
RBC: 3.45 MIL/uL — AB (ref 3.87–5.11)
RDW: 13.3 % (ref 11.5–15.5)
WBC: 6.9 10*3/uL (ref 4.0–10.5)

## 2013-07-13 LAB — GLUCOSE, CAPILLARY
GLUCOSE-CAPILLARY: 90 mg/dL (ref 70–99)
Glucose-Capillary: 107 mg/dL — ABNORMAL HIGH (ref 70–99)
Glucose-Capillary: 128 mg/dL — ABNORMAL HIGH (ref 70–99)

## 2013-07-13 MED ORDER — METHYLPREDNISOLONE SODIUM SUCC 40 MG IJ SOLR
40.0000 mg | Freq: Two times a day (BID) | INTRAMUSCULAR | Status: DC
  Administered 2013-07-13 – 2013-07-16 (×7): 40 mg via INTRAVENOUS
  Filled 2013-07-13 (×9): qty 1

## 2013-07-13 MED ORDER — POTASSIUM CHLORIDE CRYS ER 20 MEQ PO TBCR
40.0000 meq | EXTENDED_RELEASE_TABLET | Freq: Once | ORAL | Status: AC
  Administered 2013-07-13: 40 meq via ORAL
  Filled 2013-07-13: qty 2

## 2013-07-13 MED ORDER — INSULIN ASPART 100 UNIT/ML ~~LOC~~ SOLN
0.0000 [IU] | Freq: Three times a day (TID) | SUBCUTANEOUS | Status: DC
  Administered 2013-07-14 – 2013-07-15 (×4): 1 [IU] via SUBCUTANEOUS
  Administered 2013-07-15: 2 [IU] via SUBCUTANEOUS
  Administered 2013-07-15 – 2013-07-16 (×2): 1 [IU] via SUBCUTANEOUS

## 2013-07-13 NOTE — Progress Notes (Signed)
Pt reports feeling a little better today. Less pain today +stool/air in ostomy  Mildly TTP in LLQ  CT reviewed  abd pain, n/v - ct suggests infectious vs inflammatory process in SB. Without fever, wbc suspicion for infectious etiology is low. Favor inflammatory process like Crohns given her hx. Recommend trial of treatment for Crohns. Discussed with dr Nathaniel Man. Andrey Campanile, MD, FACS General, Bariatric, & Minimally Invasive Surgery [facility], [place]

## 2013-07-13 NOTE — Progress Notes (Signed)
[facility] Progress Note  Subjective: The patient is feeling okay today. She is not having much in the way of abdominal pain at this time.  Objective: Vital signs in last 24 hours: Temp:  [98.4 F (36.9 C)-99.1 F (37.3 C)] 98.4 F (36.9 C) (02/18 0508) Pulse Rate:  [69-90] 69 (02/18 0508) Resp:  [18] 18 (02/18 0508) BP: (109-116)/(49-63) 109/49 mmHg (02/18 0508) SpO2:  [100 %] 100 % (02/18 0508) Weight change:    PE:  Abdomen is soft and nontender  Lab Results: Results for orders placed during the hospital encounter of 07/10/13 (from the past 24 hour(s))  GLUCOSE, CAPILLARY     Status: Abnormal   Collection Time    07/12/13 12:03 PM      Result Value Ref Range   Glucose-Capillary 113 (*) 70 - 99 mg/dL  GLUCOSE, CAPILLARY     Status: Abnormal   Collection Time    07/12/13  4:32 PM      Result Value Ref Range   Glucose-Capillary 111 (*) 70 - 99 mg/dL  GLUCOSE, CAPILLARY     Status: Abnormal   Collection Time    07/12/13  8:57 PM      Result Value Ref Range   Glucose-Capillary 115 (*) 70 - 99 mg/dL  CBC     Status: Abnormal   Collection Time    07/13/13  5:00 AM      Result Value Ref Range   WBC 6.9  4.0 - 10.5 K/uL   RBC 3.45 (*) 3.87 - 5.11 MIL/uL   Hemoglobin 9.7 (*) 12.0 - 15.0 g/dL   HCT 72.0 (*) 94.7 - 09.6 %   MCV 84.9  78.0 - 100.0 fL   MCH 28.1  26.0 - 34.0 pg   MCHC 33.1  30.0 - 36.0 g/dL   RDW 28.3  66.2 - 94.7 %   Platelets 237  150 - 400 K/uL  BASIC METABOLIC PANEL     Status: Abnormal   Collection Time    07/13/13  5:00 AM      Result Value Ref Range   Sodium 141  137 - 147 mEq/L   Potassium 3.2 (*) 3.7 - 5.3 mEq/L   Chloride 105  96 - 112 mEq/L   CO2 26  19 - 32 mEq/L   Glucose, Bld 92  70 - 99 mg/dL   BUN 3 (*) 6 - 23 mg/dL   Creatinine, Ser 6.54  0.50 - 1.10 mg/dL   Calcium 9.0  8.4 - 65.0 mg/dL   GFR calc non Af Amer 62 (*) >90 mL/min   GFR calc Af Amer 72 (*) >90 mL/min  GLUCOSE, CAPILLARY     Status: None   Collection  Time    07/13/13  6:20 AM      Result Value Ref Range   Glucose-Capillary 90  70 - 99 mg/dL    Studies/Results: @RISRSLT24 @    Assessment: Abdominal pain. I reviewed the CT yesterday with the radiologist. I discussed the case with Dr. from surgery. She has a persistent inflammatory phlegmon in the lower abdomen/pelvic area that he does not think this is infectious. Since she does have a history of Crohn's disease we will go ahead and give her a therapeutic trial of steroids in the form of Solu-Medrol to see if this helps her.  Plan: Solu-Medrol will be ordered.    Canisha Issac F 07/13/2013, 11:10 AM

## 2013-07-13 NOTE — Progress Notes (Signed)
Patient ID: [name], female   DOB: [birth year], 63 y.o.   MRN: [id number]    Subjective: Pt feels ok today.  No nausea and passing flatus and stool in ostomy.  Pain is better  Objective: Vital signs in last 24 hours: Temp:  [98.4 F (36.9 C)-99.1 F (37.3 C)] 98.4 F (36.9 C) (02/18 0508) Pulse Rate:  [69-90] 69 (02/18 0508) Resp:  [18] 18 (02/18 0508) BP: (109-116)/(49-63) 109/49 mmHg (02/18 0508) SpO2:  [100 %] 100 % (02/18 0508) Last BM Date: 07/13/13  Intake/Output from previous day: 02/17 0701 - 02/18 0700 In: -  Out: 300 [Stool:300] Intake/Output this shift: Total I/O In: 120 [P.O.:120] Out: -   PE: Abd: soft, mild mid abdominal tenderness and some in RLQ around ostomy.  Ostomy with air currently.  +BS  Lab Results:   Recent Labs  07/11/13 0757 07/13/13 0500  WBC 6.3 6.9  HGB 9.3* 9.7*  HCT 28.7* 29.3*  PLT 242 237   BMET  Recent Labs  07/11/13 0757 07/13/13 0500  NA 138 141  K 4.1 3.2*  CL 107 105  CO2 23 26  GLUCOSE 78 92  BUN 5* 3*  CREATININE 0.94 0.95  CALCIUM 8.7 9.0   PT/INR No results found for this basename: LABPROT, INR,  in the last 72 hours CMP     Component Value Date/Time   NA 141 07/13/2013 0500   K 3.2* 07/13/2013 0500   CL 105 07/13/2013 0500   CO2 26 07/13/2013 0500   GLUCOSE 92 07/13/2013 0500   BUN 3* 07/13/2013 0500   CREATININE 0.95 07/13/2013 0500   CREATININE 1.04 08/27/2012 1054   CALCIUM 9.0 07/13/2013 0500   CALCIUM 9.0 07/15/2011 0920   PROT 7.7 07/10/2013 0328   ALBUMIN 3.3* 07/10/2013 0328   AST 12 07/10/2013 0328   ALT 10 07/10/2013 0328   ALKPHOS 113 07/10/2013 0328   BILITOT 0.3 07/10/2013 0328   GFRNONAA 62* 07/13/2013 0500   GFRAA 72* 07/13/2013 0500   Lipase     Component Value Date/Time   LIPASE 13 07/10/2013 0328       Studies/Results: Nm Gastric Emptying  07/11/2013   CLINICAL DATA:  Abdominal pain  EXAM: NUCLEAR MEDICINE GASTRIC EMPTYING SCAN  TECHNIQUE: After oral ingestion of radiolabeled  meal, sequential abdominal images were obtained for 120 minutes. Residual percentage of activity remaining within the stomach was calculated at 60 and 120 minutes.  COMPARISON:  None.  RADIOPHARMACEUTICALS:  2.0Technetium 99-m labeled sulfur colloid  FINDINGS: Expected location of the stomach in the left upper quadrant. Ingested meal empties the stomach gradually over the course of the study with 50% retention at 60 min and 4.0% retention at 120 min (normal retention less than 30% at a 120 min).  IMPRESSION: Normal gastric emptying study.   Electronically Signed   By: Loralie Champagne M.D.   On: 07/11/2013 16:54   Ct Abdomen Pelvis W Contrast  07/12/2013   CLINICAL DATA:  Recurrent abdominal pain. Crohn's disease. Recurrent small-bowel obstructions and abscess. Previous total colectomy and ileostomy.  EXAM: CT ABDOMEN AND PELVIS WITH CONTRAST  TECHNIQUE: Multidetector CT imaging of the abdomen and pelvis was performed using the standard protocol following bolus administration of intravenous contrast.  CONTRAST:  2mL OMNIPAQUE IOHEXOL 300 MG/ML  SOLN  COMPARISON:  06/21/2013  FINDINGS: Previous total colectomy and right lower quadrant ileostomy are again demonstrated. There is increased dilatation of distal small bowel loops seen in the lower abdomen and pelvis, with a  transition point seen in the central pelvis, where there is increased small bowel wall thickening. This appears contiguous with the vaginal cuff where there is a complex cystic lesion measuring 2.4 x 3.3 cm on image 62, which is mildly increased in size compared to 1.6 x 2.7 cm previously. This constellation of findings suggests increased inflammatory process involving distal small bowel and possible abscess involving the vaginal cuff, with neoplasm considered less likely.  The liver, gallbladder, pancreas, spleen, adrenal glands, and kidneys are normal in appearance. No evidence of hydronephrosis. No abdominal or pelvic lymphadenopathy identified.   IMPRESSION: Increased distal small bowel obstruction. Increased small bowel wall thickening and small complex collection involving the vaginal cuff at the transition point in central pelvis, suspicious for recurrence of Crohn's disease, with neoplasm considered less likely.   Electronically Signed   By: Myles Rosenthal M.D.   On: 07/12/2013 13:56    Anti-infectives: Anti-infectives   Start     Dose/Rate Route Frequency Ordered Stop   07/10/13 1200  hydroxychloroquine (PLAQUENIL) tablet 200 mg     200 mg Oral Daily 07/10/13 1139         Assessment/Plan  1. Abdominal pain 2. Chronic n/v 3. Possible crohn's flare on recent CT  Plan: 1. Dr. Andrey Campanile has reviewed her CT scan with radiology and spoken to Dr. Evette Cristal about it as well.  This area in the pelvis could be infectious vs inflammatory; however, she doesn't have a WBC or a fever which argues against infectious.  Dr. Evette Cristal will put her on IV solu-medrol to treat in case this is a flare up of crohns.  We will follow along.   LOS: 3 days    Zoltan Genest E 07/13/2013, 10:36 AM Pager: [phone]

## 2013-07-13 NOTE — Progress Notes (Signed)
[facility] PROGRESS NOTE  [name] HYQ:[id number] DOB: 1950/06/19 DOA: 07/10/2013 PCP: [name], MD  Assessment/Plan: 63 year old female PMH HTN, COPD, DM, RA, deperssion, anxiety, Crohn's disease s/p total abdominal colectomy and ileostomy in 2007, recent SBO s/p exploratory laparotomy, lysis of adhesions, and this resolved her small bowel obstruction presented with worsenign of abdominal pain   1. Recurrent epigastric abdominal pain with nausea vomiting ongoing for 6 months  -requiring multiple hospital admissions, h/o partial small bowel obstruction, pelvic abscess which required antibiotic therapy and percutaneous drainage -repeat CT abd/pelvis:Increased distal small bowel obstruction, suspicious for recurrence of Crohn's disease  -steroids defer to GI; appreciate GI, surgery input   2. Crohn's disease s/p total abdominal colectomy and ileostomy in 2007; per GI  3. COPD. No acute issues nebulizer treatments and oxygen as needed. No wheezing on exam. Uses oxygen at home on as needed basis  4. Severe protein telemetry malnutrition. Resume Po supplements   5. DM type II. A1c-5.1, cont ISS as needed  6. Rheumatoid arthritis. Mostly in her hands. Continue home medications which include Plaquenil. Outpatient followup with Dr. Dierdre Forth post discharge.  7. Depression, anxiety; cont home regimen antidepressants   Code Status: full Family Communication: d/w patiet (indicate person spoken with, relationship, and if by phone, the number) Disposition Plan: home 24-48 hours    Consultants: GI, surgery   Procedures:  None   Antibiotics:  None  (indicate start date, and stop date if known)  HPI/Subjective: alert  Objective: Filed Vitals:   07/13/13 0508  BP: 109/49  Pulse: 69  Temp: 98.4 F (36.9 C)  Resp: 18    Intake/Output Summary (Last 24 hours) at 07/13/13 0947 Last data filed at 07/13/13 0935  Gross per 24 hour  Intake    120 ml  Output    300  ml  Net   -180 ml   Filed Weights   07/10/13 0304 07/11/13 0419 07/12/13 0524  Weight: 49.442 kg (109 lb) 57.4 kg (126 lb 8.7 oz) 56.6 kg (124 lb 12.5 oz)    Exam:   General:  alert  Cardiovascular: s1,s2 rrr  Respiratory: no wheezing   Abdomen: soft, colostomy present   Musculoskeletal: no LE edema   Data Reviewed: Basic Metabolic Panel:  Recent Labs Lab 07/10/13 0328 07/10/13 1620 07/11/13 0757 07/13/13 0500  NA 141  --  138 141  K 4.1  --  4.1 3.2*  CL 101  --  107 105  CO2 24  --  23 26  GLUCOSE 77  --  78 92  BUN 12  --  5* 3*  CREATININE 1.09 0.95 0.94 0.95  CALCIUM 9.6  --  8.7 9.0   Liver Function Tests:  Recent Labs Lab 07/10/13 0328  AST 12  ALT 10  ALKPHOS 113  BILITOT 0.3  PROT 7.7  ALBUMIN 3.3*    Recent Labs Lab 07/10/13 0328  LIPASE 13   No results found for this basename: AMMONIA,  in the last 168 hours CBC:  Recent Labs Lab 07/10/13 0328 07/10/13 1620 07/11/13 0757 07/13/13 0500  WBC 12.2* 10.3 6.3 6.9  NEUTROABS 9.3*  --   --   --   HGB 11.3* 10.3* 9.3* 9.7*  HCT 34.1* 31.5* 28.7* 29.3*  MCV 86.3 87.5 86.7 84.9  PLT 369 290 242 237   Cardiac Enzymes: No results found for this basename: CKTOTAL, CKMB, CKMBINDEX, TROPONINI,  in the last 168 hours BNP (last 3 results) No results found for this basename:  PROBNP,  in the last 8760 hours CBG:  Recent Labs Lab 07/12/13 0745 07/12/13 1203 07/12/13 1632 07/12/13 2057 07/13/13 0620  GLUCAP 120* 113* 111* 115* 90    No results found for this or any previous visit (from the past 240 hour(s)).   Studies: Nm Gastric Emptying  07/11/2013   CLINICAL DATA:  Abdominal pain  EXAM: NUCLEAR MEDICINE GASTRIC EMPTYING SCAN  TECHNIQUE: After oral ingestion of radiolabeled meal, sequential abdominal images were obtained for 120 minutes. Residual percentage of activity remaining within the stomach was calculated at 60 and 120 minutes.  COMPARISON:  None.  RADIOPHARMACEUTICALS:   2.0Technetium 99-m labeled sulfur colloid  FINDINGS: Expected location of the stomach in the left upper quadrant. Ingested meal empties the stomach gradually over the course of the study with 50% retention at 60 min and 4.0% retention at 120 min (normal retention less than 30% at a 120 min).  IMPRESSION: Normal gastric emptying study.   Electronically Signed   By: Loralie Champagne M.D.   On: 07/11/2013 16:54   Ct Abdomen Pelvis W Contrast  07/12/2013   CLINICAL DATA:  Recurrent abdominal pain. Crohn's disease. Recurrent small-bowel obstructions and abscess. Previous total colectomy and ileostomy.  EXAM: CT ABDOMEN AND PELVIS WITH CONTRAST  TECHNIQUE: Multidetector CT imaging of the abdomen and pelvis was performed using the standard protocol following bolus administration of intravenous contrast.  CONTRAST:  3mL OMNIPAQUE IOHEXOL 300 MG/ML  SOLN  COMPARISON:  06/21/2013  FINDINGS: Previous total colectomy and right lower quadrant ileostomy are again demonstrated. There is increased dilatation of distal small bowel loops seen in the lower abdomen and pelvis, with a transition point seen in the central pelvis, where there is increased small bowel wall thickening. This appears contiguous with the vaginal cuff where there is a complex cystic lesion measuring 2.4 x 3.3 cm on image 62, which is mildly increased in size compared to 1.6 x 2.7 cm previously. This constellation of findings suggests increased inflammatory process involving distal small bowel and possible abscess involving the vaginal cuff, with neoplasm considered less likely.  The liver, gallbladder, pancreas, spleen, adrenal glands, and kidneys are normal in appearance. No evidence of hydronephrosis. No abdominal or pelvic lymphadenopathy identified.  IMPRESSION: Increased distal small bowel obstruction. Increased small bowel wall thickening and small complex collection involving the vaginal cuff at the transition point in central pelvis, suspicious for  recurrence of Crohn's disease, with neoplasm considered less likely.   Electronically Signed   By: Myles Rosenthal M.D.   On: 07/12/2013 13:56    Scheduled Meds: . arformoterol  15 mcg Nebulization BID  . aspirin  81 mg Oral Q M,W,F  . atorvastatin  10 mg Oral q1800  . budesonide  0.25 mg Nebulization BID  . calcium-vitamin D  1 tablet Oral BID  . citalopram  20 mg Oral Daily  . diltiazem  180 mg Oral BID  . famotidine  20 mg Oral Daily  . feeding supplement (PRO-STAT SUGAR FREE 64)  30 mL Oral Daily  . fluticasone  2 spray Each Nare Daily  . guaiFENesin  1,200 mg Oral BID  . heparin  5,000 Units Subcutaneous 3 times per day  . hydroxychloroquine  200 mg Oral Daily  . insulin aspart  0-9 Units Subcutaneous Q6H  . pantoprazole  40 mg Oral Daily  . 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 If 7PM-7AM, please contact night-coverage at [email or web address], password [facility] 07/13/2013, 9:47 AM  LOS: 3 days

## 2013-07-14 DIAGNOSIS — K56609 Unspecified intestinal obstruction, unspecified as to partial versus complete obstruction: Secondary | ICD-10-CM

## 2013-07-14 LAB — GLUCOSE, CAPILLARY
GLUCOSE-CAPILLARY: 142 mg/dL — AB (ref 70–99)
GLUCOSE-CAPILLARY: 137 mg/dL — AB (ref 70–99)
Glucose-Capillary: 143 mg/dL — ABNORMAL HIGH (ref 70–99)
Glucose-Capillary: 176 mg/dL — ABNORMAL HIGH (ref 70–99)

## 2013-07-14 NOTE — Progress Notes (Signed)
[facility] PROGRESS NOTE  [name] AUQ:[id number] DOB: 1951-01-18 DOA: 07/10/2013 PCP: [name], MD  Assessment/Plan: 63 year old female PMH HTN, COPD, DM, RA, deperssion, anxiety, Crohn's disease s/p total abdominal colectomy and ileostomy in 2007, recent SBO s/p exploratory laparotomy, lysis of adhesions, and this resolved her small bowel obstruction presented with worsenign of abdominal pain   1. Recurrent epigastric abdominal pain with nausea vomiting ongoing for 6 months  -requiring multiple hospital admissions, h/o partial small bowel obstruction, pelvic abscess which required antibiotic therapy and percutaneous drainage -repeat CT abd/pelvis:Increased distal small bowel obstruction, suspicious for recurrence of Crohn's disease  -steroids started GI; appreciate GI, surgery input   2. Crohn's disease s/p total abdominal colectomy and ileostomy in 2007; per GI  3. COPD. No acute issues nebulizer treatments and oxygen as needed. No wheezing on exam. Uses oxygen at home on as needed basis  4. Severe protein telemetry malnutrition. Resume Po supplements   5. DM type II. A1c-5.1, cont ISS as needed  6. Rheumatoid arthritis. Mostly in her hands. Continue home medications which include Plaquenil. Outpatient followup with Dr. Dierdre Forth post discharge.  7. Depression, anxiety; cont home regimen antidepressants   D/c plan per GI;   Code Status: full Family Communication: d/w patiet (indicate person spoken with, relationship, and if by phone, the number) Disposition Plan: home 24-48 hours    Consultants: GI, surgery   Procedures:  None   Antibiotics:  None  (indicate start date, and stop date if known)  HPI/Subjective: alert  Objective: Filed Vitals:   07/14/13 0924  BP: 114/61  Pulse:   Temp:   Resp:     Intake/Output Summary (Last 24 hours) at 07/14/13 1055 Last data filed at 07/13/13 2000  Gross per 24 hour  Intake     60 ml  Output    250  ml  Net   -190 ml   Filed Weights   07/10/13 0304 07/11/13 0419 07/12/13 0524  Weight: 49.442 kg (109 lb) 57.4 kg (126 lb 8.7 oz) 56.6 kg (124 lb 12.5 oz)    Exam:   General:  alert  Cardiovascular: s1,s2 rrr  Respiratory: no wheezing   Abdomen: soft, colostomy present   Musculoskeletal: no LE edema   Data Reviewed: Basic Metabolic Panel:  Recent Labs Lab 07/10/13 0328 07/10/13 1620 07/11/13 0757 07/13/13 0500  NA 141  --  138 141  K 4.1  --  4.1 3.2*  CL 101  --  107 105  CO2 24  --  23 26  GLUCOSE 77  --  78 92  BUN 12  --  5* 3*  CREATININE 1.09 0.95 0.94 0.95  CALCIUM 9.6  --  8.7 9.0   Liver Function Tests:  Recent Labs Lab 07/10/13 0328  AST 12  ALT 10  ALKPHOS 113  BILITOT 0.3  PROT 7.7  ALBUMIN 3.3*    Recent Labs Lab 07/10/13 0328  LIPASE 13   No results found for this basename: AMMONIA,  in the last 168 hours CBC:  Recent Labs Lab 07/10/13 0328 07/10/13 1620 07/11/13 0757 07/13/13 0500  WBC 12.2* 10.3 6.3 6.9  NEUTROABS 9.3*  --   --   --   HGB 11.3* 10.3* 9.3* 9.7*  HCT 34.1* 31.5* 28.7* 29.3*  MCV 86.3 87.5 86.7 84.9  PLT 369 290 242 237   Cardiac Enzymes: No results found for this basename: CKTOTAL, CKMB, CKMBINDEX, TROPONINI,  in the last 168 hours BNP (last 3 results) No results found  for this basename: PROBNP,  in the last 8760 hours CBG:  Recent Labs Lab 07/13/13 0620 07/13/13 1223 07/13/13 1723 07/14/13 0014 07/14/13 0724  GLUCAP 90 107* 128* 176* 142*    No results found for this or any previous visit (from the past 240 hour(s)).   Studies: Ct Abdomen Pelvis W Contrast  07/12/2013   CLINICAL DATA:  Recurrent abdominal pain. Crohn's disease. Recurrent small-bowel obstructions and abscess. Previous total colectomy and ileostomy.  EXAM: CT ABDOMEN AND PELVIS WITH CONTRAST  TECHNIQUE: Multidetector CT imaging of the abdomen and pelvis was performed using the standard protocol following bolus administration of  intravenous contrast.  CONTRAST:  27mL OMNIPAQUE IOHEXOL 300 MG/ML  SOLN  COMPARISON:  06/21/2013  FINDINGS: Previous total colectomy and right lower quadrant ileostomy are again demonstrated. There is increased dilatation of distal small bowel loops seen in the lower abdomen and pelvis, with a transition point seen in the central pelvis, where there is increased small bowel wall thickening. This appears contiguous with the vaginal cuff where there is a complex cystic lesion measuring 2.4 x 3.3 cm on image 62, which is mildly increased in size compared to 1.6 x 2.7 cm previously. This constellation of findings suggests increased inflammatory process involving distal small bowel and possible abscess involving the vaginal cuff, with neoplasm considered less likely.  The liver, gallbladder, pancreas, spleen, adrenal glands, and kidneys are normal in appearance. No evidence of hydronephrosis. No abdominal or pelvic lymphadenopathy identified.  IMPRESSION: Increased distal small bowel obstruction. Increased small bowel wall thickening and small complex collection involving the vaginal cuff at the transition point in central pelvis, suspicious for recurrence of Crohn's disease, with neoplasm considered less likely.   Electronically Signed   By: Myles Rosenthal M.D.   On: 07/12/2013 13:56    Scheduled Meds: . arformoterol  15 mcg Nebulization BID  . aspirin  81 mg Oral Q M,W,F  . atorvastatin  10 mg Oral q1800  . budesonide  0.25 mg Nebulization BID  . calcium-vitamin D  1 tablet Oral BID  . citalopram  20 mg Oral Daily  . diltiazem  180 mg Oral BID  . famotidine  20 mg Oral Daily  . feeding supplement (PRO-STAT SUGAR FREE 64)  30 mL Oral Daily  . fluticasone  2 spray Each Nare Daily  . guaiFENesin  1,200 mg Oral BID  . heparin  5,000 Units Subcutaneous 3 times per day  . hydroxychloroquine  200 mg Oral Daily  . insulin aspart  0-9 Units Subcutaneous TID WC  . methylPREDNISolone (SOLU-MEDROL) injection  40 mg  Intravenous Q12H  . pantoprazole  40 mg Oral Daily  . 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 If 7PM-7AM, please contact night-coverage at [email or web address], password [facility] 07/14/2013, 10:55 AM  LOS: 4 days

## 2013-07-14 NOTE — Progress Notes (Signed)
[facility] Progress Note  Subjective: The patient was feeling better this morning but this afternoon is having some lower abdominal discomfort. She was able to eat her meals today.  Objective: Vital signs in last 24 hours: Temp:  [98.3 F (36.8 C)-98.6 F (37 C)] 98.4 F (36.9 C) (02/19 1409) Pulse Rate:  [82-103] 87 (02/19 1409) Resp:  [15-18] 17 (02/19 1409) BP: (114-138)/(60-68) 138/60 mmHg (02/19 1409) SpO2:  [98 %-100 %] 100 % (02/19 1409) Weight change:    PE:  Abdomen is soft with some discomfort in the lower abdomen but no rebound or guarding  Lab Results: Results for orders placed during the hospital encounter of 07/10/13 (from the past 24 hour(s))  GLUCOSE, CAPILLARY     Status: Abnormal   Collection Time    07/13/13  5:23 PM      Result Value Ref Range   Glucose-Capillary 128 (*) 70 - 99 mg/dL   Comment 1 Notify RN    GLUCOSE, CAPILLARY     Status: Abnormal   Collection Time    07/14/13 12:14 AM      Result Value Ref Range   Glucose-Capillary 176 (*) 70 - 99 mg/dL  GLUCOSE, CAPILLARY     Status: Abnormal   Collection Time    07/14/13  7:24 AM      Result Value Ref Range   Glucose-Capillary 142 (*) 70 - 99 mg/dL  GLUCOSE, CAPILLARY     Status: Abnormal   Collection Time    07/14/13 12:05 PM      Result Value Ref Range   Glucose-Capillary 137 (*) 70 - 99 mg/dL    Studies/Results: @[id number] @    Assessment: Inflammatory phlegmon lower abdomen/pelvic area  Plan: Continue Solu-Medrol and see if she improves with this    Magaret Justo F 07/14/2013, 2:40 PM

## 2013-07-14 NOTE — Progress Notes (Signed)
Feels better today. Less pain. Pt just devoured an entire regular breakfast.  Ostomy functioning with air/stool  Alert, nad Soft, very mild LLQ. Ostomy ok  Cont current txt for presumed Crohns flare  Mary Sella. Andrey Campanile, MD, FACS General, Bariatric, & Minimally Invasive Surgery [facility], [place]

## 2013-07-14 NOTE — Progress Notes (Addendum)
Subjective: Pt states she is feeling better than yesterday.  Reports eating breakfast of grits and sausage this morning with minimal symptoms afterwards.  Reports she is regularly filling her colostomy bag with stool. Pt ambulates without difficulty.  Objective: Vital signs in last 24 hours: Temp:  [98.3 F (36.8 C)-99.5 F (37.5 C)] 98.3 F (36.8 C) (02/19 0703) Pulse Rate:  [82-103] 103 (02/19 0703) Resp:  [15-18] 15 (02/19 0703) BP: (109-123)/(61-68) 114/61 mmHg (02/19 0924) SpO2:  [98 %-100 %] 100 % (02/19 0855) Last BM Date: 07/14/13  Intake/Output from previous day: 02/18 0701 - 02/19 0700 In: 180 [P.O.:180] Out: 250 [Stool:250] Intake/Output this shift:    PE: Gen:  Alert, NAD, pleasant Abd: Soft, minimal tenderness to LLQ. +BS. Flatus present in colostomy bag. Well healed abdominal scars present.   Lab Results:   Recent Labs  07/13/13 0500  WBC 6.9  HGB 9.7*  HCT 29.3*  PLT 237   BMET  Recent Labs  07/13/13 0500  NA 141  K 3.2*  CL 105  CO2 26  GLUCOSE 92  BUN 3*  CREATININE 0.95  CALCIUM 9.0   PT/INR No results found for this basename: LABPROT, INR,  in the last 72 hours CMP     Component Value Date/Time   NA 141 07/13/2013 0500   K 3.2* 07/13/2013 0500   CL 105 07/13/2013 0500   CO2 26 07/13/2013 0500   GLUCOSE 92 07/13/2013 0500   BUN 3* 07/13/2013 0500   CREATININE 0.95 07/13/2013 0500   CREATININE 1.04 08/27/2012 1054   CALCIUM 9.0 07/13/2013 0500   CALCIUM 9.0 07/15/2011 0920   PROT 7.7 07/10/2013 0328   ALBUMIN 3.3* 07/10/2013 0328   AST 12 07/10/2013 0328   ALT 10 07/10/2013 0328   ALKPHOS 113 07/10/2013 0328   BILITOT 0.3 07/10/2013 0328   GFRNONAA 62* 07/13/2013 0500   GFRAA 72* 07/13/2013 0500   Lipase     Component Value Date/Time   LIPASE 13 07/10/2013 0328       Studies/Results: Ct Abdomen Pelvis W Contrast  07/12/2013   CLINICAL DATA:  Recurrent abdominal pain. Crohn's disease. Recurrent small-bowel obstructions and abscess.  Previous total colectomy and ileostomy.  EXAM: CT ABDOMEN AND PELVIS WITH CONTRAST  TECHNIQUE: Multidetector CT imaging of the abdomen and pelvis was performed using the standard protocol following bolus administration of intravenous contrast.  CONTRAST:  26mL OMNIPAQUE IOHEXOL 300 MG/ML  SOLN  COMPARISON:  06/21/2013  FINDINGS: Previous total colectomy and right lower quadrant ileostomy are again demonstrated. There is increased dilatation of distal small bowel loops seen in the lower abdomen and pelvis, with a transition point seen in the central pelvis, where there is increased small bowel wall thickening. This appears contiguous with the vaginal cuff where there is a complex cystic lesion measuring 2.4 x 3.3 cm on image 62, which is mildly increased in size compared to 1.6 x 2.7 cm previously. This constellation of findings suggests increased inflammatory process involving distal small bowel and possible abscess involving the vaginal cuff, with neoplasm considered less likely.  The liver, gallbladder, pancreas, spleen, adrenal glands, and kidneys are normal in appearance. No evidence of hydronephrosis. No abdominal or pelvic lymphadenopathy identified.  IMPRESSION: Increased distal small bowel obstruction. Increased small bowel wall thickening and small complex collection involving the vaginal cuff at the transition point in central pelvis, suspicious for recurrence of Crohn's disease, with neoplasm considered less likely.   Electronically Signed   By: Alver Sorrow.D.  On: 07/12/2013 13:56    Anti-infectives: Anti-infectives   Start     Dose/Rate Route Frequency Ordered Stop   07/10/13 1200  hydroxychloroquine (PLAQUENIL) tablet 200 mg     200 mg Oral Daily 07/10/13 1139         Assessment/Plan 1. Abdominal pain  2. Chronic n/v  3. Small bowel wall thickening - Possible crohn's flare on recent CT   Plan:  1. Dr. Andrey Campanile has reviewed her CT scan with radiology and spoken to Dr. Evette Cristal about it  as well. This area in the pelvis could be infectious vs inflammatory; however, she doesn't have a WBC or a fever which argues against infectious.  2.  Pt seems to have improved since addition of IV solu-medrol by Dr. Evette Cristal 3.  Will follow    LOS: 4 days    Casimiro Needle A. [employer] 07/14/2013, 11:06 AM [facility] Phone: [phone]  ----------------------------------------------------------------------------------------------------------- General Surgery PA Preceptor Note:  I agree with the above PA students findings as above.  Made changes as above.  Aris [place], PA-C General Surgery [facility] Pager: [phone] Office: [phone]

## 2013-07-15 LAB — GLUCOSE, CAPILLARY
Glucose-Capillary: 143 mg/dL — ABNORMAL HIGH (ref 70–99)
Glucose-Capillary: 155 mg/dL — ABNORMAL HIGH (ref 70–99)
Glucose-Capillary: 138 mg/dL — ABNORMAL HIGH (ref 70–99)
Glucose-Capillary: 164 mg/dL — ABNORMAL HIGH (ref 70–99)

## 2013-07-15 NOTE — Progress Notes (Signed)
Patient feels better. Had some nausea and one episode of vomiting yesterday. So far none today. Mild pain  Alert, no apparent distress Abdomen-soft, mild tenderness on the left. Air and stool in ostomy bag  Nothing really 2 add from our point of view. No sign of bowel obstruction Appears to slowly be getting better with treatment for presumed Crohn's flare From our point of view can probably go home  Mary Sella. Andrey Campanile, MD, FACS General, Bariatric, & Minimally Invasive Surgery [facility], [place]

## 2013-07-15 NOTE — Progress Notes (Signed)
  Subjective: Pt feels good now, minimal pain, no N/V.  Last night didn't sleep well and had a lot of pain.  Ambulating well.  Using IS.  Tolerating a regular diet with supplements.  Ostomy functioning well.  Objective: Vital signs in last 24 hours: Temp:  [98.4 F (36.9 C)-98.8 F (37.1 C)] 98.6 F (37 C) (02/20 0512) Pulse Rate:  [86-102] 102 (02/20 0512) Resp:  [16-17] 16 (02/20 0512) BP: (102-145)/(56-76) 102/56 mmHg (02/20 0948) SpO2:  [100 %] 100 % (02/20 0903) Weight:  [124 lb 9 oz (56.5 kg)] 124 lb 9 oz (56.5 kg) (02/20 0512) Last BM Date:  (colostomy)  Intake/Output from previous day:   Intake/Output this shift:    PE: Gen:  Alert, NAD, pleasant Abd: Soft, minimally tender, ND, +BS, no HSM, ostomy functioning well, well healed abdominal scars noted   Lab Results:   Recent Labs  07/13/13 0500  WBC 6.9  HGB 9.7*  HCT 29.3*  PLT 237   BMET  Recent Labs  07/13/13 0500  NA 141  K 3.2*  CL 105  CO2 26  GLUCOSE 92  BUN 3*  CREATININE 0.95  CALCIUM 9.0   PT/INR No results found for this basename: LABPROT, INR,  in the last 72 hours CMP     Component Value Date/Time   NA 141 07/13/2013 0500   K 3.2* 07/13/2013 0500   CL 105 07/13/2013 0500   CO2 26 07/13/2013 0500   GLUCOSE 92 07/13/2013 0500   BUN 3* 07/13/2013 0500   CREATININE 0.95 07/13/2013 0500   CREATININE 1.04 08/27/2012 1054   CALCIUM 9.0 07/13/2013 0500   CALCIUM 9.0 07/15/2011 0920   PROT 7.7 07/10/2013 0328   ALBUMIN 3.3* 07/10/2013 0328   AST 12 07/10/2013 0328   ALT 10 07/10/2013 0328   ALKPHOS 113 07/10/2013 0328   BILITOT 0.3 07/10/2013 0328   GFRNONAA 62* 07/13/2013 0500   GFRAA 72* 07/13/2013 0500   Lipase     Component Value Date/Time   LIPASE 13 07/10/2013 0328       Studies/Results: No results found.  Anti-infectives: Anti-infectives   Start     Dose/Rate Route Frequency Ordered Stop   07/10/13 1200  hydroxychloroquine (PLAQUENIL) tablet 200 mg     200 mg Oral Daily 07/10/13  1139         Assessment/Plan 1. Abdominal pain  2. Chronic n/v  3. Small bowel wall thickening - Possible crohn's flare on recent CT   Plan:  1. Dr. Andrey Campanile has reviewed her CT scan with radiology and spoken to Dr. Evette Cristal about it as well. This area in the pelvis could be infectious vs inflammatory; however, she doesn't have a WBC or a fever which argues against infectious.  2. Pt seems to have improved since addition of IV solu-medrol by Dr. Evette Cristal, tolerating some solid food and nutrition supplements, ostomy functioning well, may be ready for d/c soon 3. No surgical interventions needed at this time.  Will follow while here.    LOS: 5 days    [name], Ahjanae Cassel 07/15/2013, 11:00 AM Pager: [phone]

## 2013-07-15 NOTE — Progress Notes (Signed)
[facility] PROGRESS NOTE  [name] AOZ:[id number] DOB: Oct 02, 1950 DOA: 07/10/2013 PCP: [name], MD  Assessment/Plan: 63 year old female PMH HTN, COPD, DM, RA, deperssion, anxiety, Crohn's disease s/p total abdominal colectomy and ileostomy in 2007, recent SBO s/p exploratory laparotomy, lysis of adhesions, and this resolved her small bowel obstruction presented with worsenign of abdominal pain   1. Recurrent epigastric abdominal pain with nausea vomiting ongoing for 6 months  -requiring multiple hospital admissions, h/o partial small bowel obstruction, pelvic abscess which required antibiotic therapy and percutaneous drainage -repeat CT abd/pelvis:Increased distal small bowel obstruction, suspicious for recurrence of Crohn's disease  -improving on steroids started GI; appreciate GI, surgery input   2. Crohn's disease s/p total abdominal colectomy and ileostomy in 2007; on steroids  3. COPD. No acute issues nebulizer treatments and oxygen as needed. No wheezing on exam. Uses oxygen at home on as needed basis  4. Severe protein telemetry malnutrition. Resume Po supplements   5. DM type II. A1c-5.1, cont ISS as needed  6. Rheumatoid arthritis. Mostly in her hands. Continue home medications which include Plaquenil. Outpatient followup with Dr. Dierdre Forth post discharge.  7. Depression, anxiety; cont home regimen antidepressants   D/c plan per GI;   Code Status: full Family Communication: d/w patiet (indicate person spoken with, relationship, and if by phone, the number) Disposition Plan: home 24-48 hours    Consultants: GI, surgery   Procedures:  None   Antibiotics:  None  (indicate start date, and stop date if known)  HPI/Subjective: alert  Objective: Filed Vitals:   07/15/13 0948  BP: 102/56  Pulse:   Temp:   Resp:    No intake or output data in the 24 hours ending 07/15/13 1138 Filed Weights   07/11/13 0419 07/12/13 0524 07/15/13 0512   Weight: 57.4 kg (126 lb 8.7 oz) 56.6 kg (124 lb 12.5 oz) 56.5 kg (124 lb 9 oz)    Exam:   General:  alert  Cardiovascular: s1,s2 rrr  Respiratory: no wheezing   Abdomen: soft, colostomy present   Musculoskeletal: no LE edema   Data Reviewed: Basic Metabolic Panel:  Recent Labs Lab 07/10/13 0328 07/10/13 1620 07/11/13 0757 07/13/13 0500  NA 141  --  138 141  K 4.1  --  4.1 3.2*  CL 101  --  107 105  CO2 24  --  23 26  GLUCOSE 77  --  78 92  BUN 12  --  5* 3*  CREATININE 1.09 0.95 0.94 0.95  CALCIUM 9.6  --  8.7 9.0   Liver Function Tests:  Recent Labs Lab 07/10/13 0328  AST 12  ALT 10  ALKPHOS 113  BILITOT 0.3  PROT 7.7  ALBUMIN 3.3*    Recent Labs Lab 07/10/13 0328  LIPASE 13   No results found for this basename: AMMONIA,  in the last 168 hours CBC:  Recent Labs Lab 07/10/13 0328 07/10/13 1620 07/11/13 0757 07/13/13 0500  WBC 12.2* 10.3 6.3 6.9  NEUTROABS 9.3*  --   --   --   HGB 11.3* 10.3* 9.3* 9.7*  HCT 34.1* 31.5* 28.7* 29.3*  MCV 86.3 87.5 86.7 84.9  PLT 369 290 242 237   Cardiac Enzymes: No results found for this basename: CKTOTAL, CKMB, CKMBINDEX, TROPONINI,  in the last 168 hours BNP (last 3 results) No results found for this basename: PROBNP,  in the last 8760 hours CBG:  Recent Labs Lab 07/14/13 0014 07/14/13 0724 07/14/13 1205 07/14/13 1721 07/15/13 0825  GLUCAP  176* 142* 137* 143* 143*    No results found for this or any previous visit (from the past 240 hour(s)).   Studies: No results found.  Scheduled Meds: . arformoterol  15 mcg Nebulization BID  . aspirin  81 mg Oral Q M,W,F  . atorvastatin  10 mg Oral q1800  . budesonide  0.25 mg Nebulization BID  . calcium-vitamin D  1 tablet Oral BID  . citalopram  20 mg Oral Daily  . diltiazem  180 mg Oral BID  . famotidine  20 mg Oral Daily  . feeding supplement (PRO-STAT SUGAR FREE 64)  30 mL Oral Daily  . fluticasone  2 spray Each Nare Daily  . guaiFENesin   1,200 mg Oral BID  . heparin  5,000 Units Subcutaneous 3 times per day  . hydroxychloroquine  200 mg Oral Daily  . insulin aspart  0-9 Units Subcutaneous TID WC  . methylPREDNISolone (SOLU-MEDROL) injection  40 mg Intravenous Q12H  . pantoprazole  40 mg Oral Daily  . 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 If 7PM-7AM, please contact night-coverage at [email or web address], password [facility] 07/15/2013, 11:38 AM  LOS: 5 days

## 2013-07-16 LAB — GLUCOSE, CAPILLARY
GLUCOSE-CAPILLARY: 149 mg/dL — AB (ref 70–99)
Glucose-Capillary: 98 mg/dL (ref 70–99)

## 2013-07-16 MED ORDER — PREDNISONE 20 MG PO TABS: 40.0000 mg | ORAL_TABLET | Freq: Every day | ORAL | Status: DC

## 2013-07-16 MED ORDER — TRAMADOL HCL 50 MG PO TABS: 50.0000 mg | ORAL_TABLET | Freq: Four times a day (QID) | ORAL | Status: DC | PRN

## 2013-07-16 NOTE — Progress Notes (Signed)
  Subjective: The patient is feeling better and looking forward to going home today. No more vomiting. Still has mild pain.  Objective: Vital signs in last 24 hours: Temp:  [98.1 F (36.7 C)-98.3 F (36.8 C)] 98.1 F (36.7 C) (02/21 0626) Pulse Rate:  [81-89] 89 (02/21 0626) Resp:  [17-18] 18 (02/21 0626) BP: (129-134)/(70-71) 129/71 mmHg (02/21 0626) SpO2:  [100 %] 100 % (02/21 0842) Last BM Date:  (colostomy)  Intake/Output from previous day:   Intake/Output this shift:    EXAM: Alert. Talkative. Appears comfortable in no distress Abdomen soft and nontender.   Lab Results:  No results found for this basename: WBC, HGB, HCT, PLT,  in the last 72 hours BMET No results found for this basename: NA, K, CL, CO2, GLUCOSE, BUN, CREATININE, CALCIUM,  in the last 72 hours PT/INR No results found for this basename: LABPROT, INR,  in the last 72 hours ABG No results found for this basename: PHART, PCO2, PO2, HCO3,  in the last 72 hours  Studies/Results: No results found.  Anti-infectives: Anti-infectives   Start     Dose/Rate Route Frequency Ordered Stop   07/10/13 1200  hydroxychloroquine (PLAQUENIL) tablet 200 mg     200 mg Oral Daily 07/10/13 1139        Assessment/Plan:  Abdominal pain Chronic nausea and vomiting Small bowel wall thickening, possible Crohn's flare seen on recent CT. No evidence of obstruction or abscess.There is no acute surgical need.  I agree with Dr. Evette Cristal and with Dr. York Spaniel  that she is ready to go home. Going home on prednisone 40 mg a day and followup with [facility]. May also followup with Dr. Irving Burton with as necessary.   LOS: 6 days    Brion Hedges M 07/16/2013

## 2013-07-16 NOTE — Progress Notes (Signed)
[facility] Progress Note  Subjective: Feels good. Eating. No pain today.  Objective: Vital signs in last 24 hours: Temp:  [98.1 F (36.7 C)-98.3 F (36.8 C)] 98.1 F (36.7 C) (02/21 0626) Pulse Rate:  [81-89] 89 (02/21 0626) Resp:  [17-18] 18 (02/21 0626) BP: (102-134)/(56-71) 129/71 mmHg (02/21 0626) SpO2:  [100 %] 100 % (02/21 0842) Weight change:    PE: Abdomen soft and not tender  Lab Results: Results for orders placed during the hospital encounter of 07/10/13 (from the past 24 hour(s))  GLUCOSE, CAPILLARY     Status: Abnormal   Collection Time    07/15/13 12:04 PM      Result Value Ref Range   Glucose-Capillary 155 (*) 70 - 99 mg/dL  GLUCOSE, CAPILLARY     Status: Abnormal   Collection Time    07/15/13  4:58 PM      Result Value Ref Range   Glucose-Capillary 138 (*) 70 - 99 mg/dL  GLUCOSE, CAPILLARY     Status: Abnormal   Collection Time    07/15/13  8:55 PM      Result Value Ref Range   Glucose-Capillary 164 (*) 70 - 99 mg/dL   Comment 1 Documented in Chart     Comment 2 Notify RN    GLUCOSE, CAPILLARY     Status: Abnormal   Collection Time    07/16/13  8:09 AM      Result Value Ref Range   Glucose-Capillary 149 (*) 70 - 99 mg/dL    Studies/Results: @[id number] @    Assessment: Inflammatory plegmon in lower abdomen. Getting better on steroids.  Plan: Ok to send home from my standpoint on prednisone 40mg  daily. See in office in a couple of weeks and follow up with Dr also. Regular diet.    GANEM,SALEM F 07/16/2013, 9:17 AM

## 2013-07-16 NOTE — Discharge Summary (Addendum)
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   GERD   CROHN'S DISEASE   Rheumatoid arthritis   Severe protein-calorie malnutrition   Abdominal pain   Discharge Condition: stable   Diet recommendation:  DM  Filed Weights   07/11/13 0419 07/12/13 0524 07/15/13 0512  Weight: 57.4 kg (126 lb 8.7 oz) 56.6 kg (124 lb 12.5 oz) 56.5 kg (124 lb 9 oz)    History of present illness:  63 year old female PMH HTN, COPD, DM, RA, deperssion, anxiety, Crohn's disease s/p total abdominal colectomy and ileostomy in 2007, recent SBO s/p exploratory laparotomy, lysis of adhesions, and this resolved her small bowel obstruction presented with worsenign of abdominal pain found to have SBO likely due to crohn's   Hospital Course:  1. Recurrent epigastric abdominal pain with nausea vomiting ongoing for 6 months  -requiring multiple hospital admissions, h/o partial small bowel obstruction, pelvic abscess which required antibiotic therapy and percutaneous drainage  -repeat CT abd/pelvis:Increased distal small bowel obstruction, suspicious for recurrence of Crohn's disease  -nausea, vomiting abd pain likely due to SBO due to crohn's exacerbation; she has improved on IV steroids, changed to PO prednisone and recommended to f/u in 2 week by GI; per surgery no surgical intervention needed at this time   2. Crohn's disease s/p total abdominal colectomy and ileostomy in 2007; on steroids  3. COPD. No acute issues nebulizer treatments and oxygen as needed. No wheezing on exam.  Uses oxygen at home on as needed basis  4. Severe protein telemetry malnutrition. Resume Po supplements  5. DM type II. A1c-5.1, f/u with PCP in 1-2 week; if sugars uncontrolled on steroids may need short time insulin  6. Rheumatoid arthritis. Mostly in her hands. Continue home medications which include Plaquenil. Outpatient followup with Dr. Dierdre ForthBeekman post discharge.  7. Depression, anxiety; cont home regimen antidepressants     Procedures:  None  (i.e. Studies not automatically included, echos, thoracentesis, etc; not x-rays)  Consultations:  GI  surgery   Discharge Exam: Filed Vitals:   07/16/13 0626  BP: 129/71  Pulse: 89  Temp: 98.1 F (36.7 C)  Resp: 18    General: alert Cardiovascular: s1,s2 rrr Respiratory: CTA BL  Discharge Instructions  Discharge Orders   Future Appointments Provider Department Dept Phone   08/11/2013 9:00 AM Storm FriskPatrick E Wright, MD [facility] [phone]   Future Orders Complete By Expires   Diet - low sodium heart healthy  As directed    Discharge instructions  As directed    Comments:     Please follow up with gastroenterologist in 2 week s   Increase activity slowly  As directed        Medication List    TAKE these medications       arformoterol 15 MCG/2ML Nebu  Commonly known as:  BROVANA  Take 15 mcg by nebulization 2 (two) times daily.     aspirin 81 MG tablet  Take 81 mg by mouth every Monday, Wednesday, and Friday.     budesonide 0.25 MG/2ML nebulizer solution  Commonly known  as:  PULMICORT  Take 0.25 mg by nebulization 2 (two) times daily.     calcium-vitamin D 500-200 MG-UNIT per tablet  Commonly known as:  OSCAL WITH D  Take 1 tablet by mouth 2 (two) times daily.     citalopram 20 MG tablet  Commonly known as:  CELEXA  Take 20 mg by mouth daily.     diltiazem 180 MG 24 hr capsule  Commonly known as:  TIAZAC  Take 180 mg by mouth 2 (two) times daily.     famotidine 20 MG tablet  Commonly known as:   PEPCID  Take 20 mg by mouth 2 (two) times daily.     feeding supplement (PRO-STAT SUGAR FREE 64) Liqd  Take 30 mLs by mouth daily.     fluticasone 50 MCG/ACT nasal spray  Commonly known as:  FLONASE  Place 2 sprays into the nose daily as needed.     FLUTTER Devi  Inhale into the lungs. 2-4 times daily     guaiFENesin 600 MG 12 hr tablet  Commonly known as:  MUCINEX  Take 1,200 mg by mouth 2 (two) times daily.     HYDROcodone-acetaminophen 5-325 MG per tablet  Commonly known as:  NORCO/VICODIN  Take 1-2 tablets by mouth every 6 (six) hours as needed for moderate pain or severe pain.     hydroxychloroquine 200 MG tablet  Commonly known as:  PLAQUENIL  Take 200 mg by mouth Twice daily.     IRON PO  Take 1 tablet by mouth every Monday, Wednesday, and Friday.     LORazepam 0.5 MG tablet  Commonly known as:  ATIVAN  Take 0.5 mg by mouth every 8 (eight) hours as needed for anxiety.     omeprazole 40 MG capsule  Commonly known as:  PRILOSEC  Take 40 mg by mouth 2 (two) times daily.     ondansetron 4 MG tablet  Commonly known as:  ZOFRAN  Take 1 tablet (4 mg total) by mouth every 6 (six) hours as needed for nausea.     predniSONE 20 MG tablet  Commonly known as:  DELTASONE  Take 2 tablets (40 mg total) by mouth daily with breakfast.     PROAIR HFA 108 (90 BASE) MCG/ACT inhaler  Generic drug:  albuterol  Inhale 2 puffs into the lungs every 6 (six) hours as needed for wheezing or shortness of breath.     rosuvastatin 5 MG tablet  Commonly known as:  CRESTOR  Take 5 mg by mouth every Monday, Wednesday, and Friday.     tiotropium 18 MCG inhalation capsule  Commonly known as:  SPIRIVA  Place 1 capsule (18 mcg total) into inhaler and inhale daily.     traMADol 50 MG tablet  Commonly known as:  ULTRAM  Take 1 tablet (50 mg total) by mouth every 6 (six) hours as needed for moderate pain or severe pain.     VICTOZA 18 MG/3ML Soln injection  Generic drug:  Liraglutide   Inject 1.8 mg into the skin daily. 1.8 units once daily     Vitamin D 1000 UNITS capsule  Take 1,000 Units by mouth daily.      ASK your doctor about these medications       celecoxib 200 MG capsule  Commonly known as:  CELEBREX  Take 200 mg by mouth daily.       Allergies  Allergen Reactions  . Esomeprazole Magnesium Other (See Comments)    NEXIUM - reaction >  aggravated pt's Crohn's disease  . Shellfish Allergy Shortness Of Breath and Nausea And Vomiting  . Surgical Lubricant Other (See Comments)    Burns skin   . Other Nausea And Vomiting    Beans, Dander/Dust, Peas, Mushrooms  . Peanut-Containing Drug Products Nausea And Vomiting  . Levaquin [Levofloxacin]        Follow-up Information   Follow up with HOXWORTH,BENJAMIN T, MD. Schedule an appointment as soon as possible for a visit in 2 weeks.   Specialty:  General Surgery   Contact information:   [address] [place] [address] [phone]       Follow up with [name], MD In 1 week.   Specialty:  Internal Medicine   Contact information:   [address] [place] [address] [phone]        The results of significant diagnostics from this hospitalization (including imaging, microbiology, ancillary and laboratory) are listed below for reference.    Significant Diagnostic Studies: Abd 1 View (kub)  06/22/2013   CLINICAL DATA:  Followup small bowel obstruction  EXAM: ABDOMEN - 1 VIEW  COMPARISON:  CT abdomen and pelvis 06/21/2013  FINDINGS: Right lower quadrant ostomy.  Excreted contrast material within bladder and minimally in right renal collecting system.  Single dilated loop of small bowel in the left pelvis consistent with small bowel obstruction.  No definite bowel wall thickening seen.  Endovascular coils noted right paraspinal at L1-L2.  Osseous structures unremarkable.  IMPRESSION: Persistent dilatation of a small bowel loop in the left pelvis consistent with  obstruction.   Electronically Signed   By: Ulyses Southward M.D.   On: 06/22/2013 08:30   Nm Gastric Emptying  07/11/2013   CLINICAL DATA:  Abdominal pain  EXAM: NUCLEAR MEDICINE GASTRIC EMPTYING SCAN  TECHNIQUE: After oral ingestion of radiolabeled meal, sequential abdominal images were obtained for 120 minutes. Residual percentage of activity remaining within the stomach was calculated at 60 and 120 minutes.  COMPARISON:  None.  RADIOPHARMACEUTICALS:  2.0Technetium 99-m labeled sulfur colloid  FINDINGS: Expected location of the stomach in the left upper quadrant. Ingested meal empties the stomach gradually over the course of the study with 50% retention at 60 min and 4.0% retention at 120 min (normal retention less than 30% at a 120 min).  IMPRESSION: Normal gastric emptying study.   Electronically Signed   By: Loralie Champagne M.D.   On: 07/11/2013 16:54   Ct Abdomen Pelvis W Contrast  07/12/2013   CLINICAL DATA:  Recurrent abdominal pain. Crohn's disease. Recurrent small-bowel obstructions and abscess. Previous total colectomy and ileostomy.  EXAM: CT ABDOMEN AND PELVIS WITH CONTRAST  TECHNIQUE: Multidetector CT imaging of the abdomen and pelvis was performed using the standard protocol following bolus administration of intravenous contrast.  CONTRAST:  57mL OMNIPAQUE IOHEXOL 300 MG/ML  SOLN  COMPARISON:  06/21/2013  FINDINGS: Previous total colectomy and right lower quadrant ileostomy are again demonstrated. There is increased dilatation of distal small bowel loops seen in the lower abdomen and pelvis, with a transition point seen in the central pelvis, where there is increased small bowel wall thickening. This appears contiguous with the vaginal cuff where there is a complex cystic lesion measuring 2.4 x 3.3 cm on image 62, which is mildly increased in size compared to 1.6 x 2.7 cm previously. This constellation of findings suggests increased inflammatory process involving distal small bowel and possible  abscess involving the vaginal cuff, with neoplasm considered less likely.  The liver, gallbladder, pancreas, spleen, adrenal glands, and kidneys are normal in appearance. No evidence of hydronephrosis. No abdominal or pelvic lymphadenopathy identified.  IMPRESSION: Increased distal small bowel obstruction. Increased small bowel wall thickening and small complex collection involving the vaginal cuff at the transition point in central pelvis, suspicious for recurrence of Crohn's disease, with neoplasm considered less likely.   Electronically Signed   By: Myles Rosenthal M.D.   On: 07/12/2013 13:56   Ct Abdomen Pelvis W Contrast  06/21/2013   CLINICAL DATA:  Upper abdominal pain.  EXAM: CT ABDOMEN AND PELVIS WITH CONTRAST  TECHNIQUE: Multidetector CT imaging of the abdomen and pelvis was performed using the standard protocol following bolus administration of intravenous contrast.  CONTRAST:  OMNIPAQUE IOHEXOL 300 MG/ML  SOLN  COMPARISON:  CT of the abdomen and pelvis 05/06/2013.  FINDINGS: Lung Bases: Unremarkable.  Abdomen/Pelvis: The appearance of the liver, gallbladder, pancreas, spleen, bilateral adrenal glands and right kidney is unremarkable. Several sub cm low-attenuation lesions are noted in the left kidney, similar to the prior study; although too small to definitively characterize, these are favored to represent tiny cysts. High density material posterior to the pre-pyloric gastric antrum, presumably embolization coils.  Postoperative changes of total colectomy with right lower quadrant ileostomy and Hartmann's pouch are again noted. There are several areas acute angulation of small bowel, best demonstrated on image 55 of series 2 adjacent to the dilated loop, where there is an acutely angulated loop of bowel, presumably related to underlying adhesions. There is a small amount a haziness in the small bowel mesentery. Trace volume of ascites. No pneumoperitoneum. No definite lymphadenopathy identified  within the abdomen or pelvis. Status post hysterectomy. Previously noted lesion associated with the vaginal cuff is less cystic in appearance and smaller, currently measuring 2.7 x 1.6 cm (image 60 of series 2), presumably some involuting postoperative scar tissue. This does appear intimately associated with multiple adjacent loops of small bowel, suggesting the presence of adhesions adjacent to the vaginal cuff. Urinary bladder is unremarkable in appearance.  Musculoskeletal: There are no aggressive appearing lytic or blastic lesions noted in the visualized portions of the skeleton.  IMPRESSION: 1. 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 There is fecalization of small bowel contents in this region. Despite the presence of dilated loops of small bowel, the more proximal portion of the small bowel does not appear dilated, suggesting against frank obstruction at this time. 2. Small amount of edema in the small bowel mesentery and trace volume of ascites may suggest some active inflammation in this patient with history of Crohn's disease. 3. No pneumoperitoneum. 4. Additional incidental findings, as above, similar prior studies.   Electronically Signed   By: Trudie Reed M.D.   On: 06/21/2013 19:32   Dg Chest Port 1 View  06/22/2013   CLINICAL DATA:  Chest pain and dyspnea.  EXAM: PORTABLE CHEST - 1 VIEW  COMPARISON:  DG CHEST 1V PORT dated 05/07/2013  FINDINGS: DG CHEST 1V PORT dated 12/13/2014The lungs are hyperinflated. The interstitial markings are increased bilaterally. The cardiac silhouette is not enlarged. The pulmonary vascularity is mildly prominent centrally. There is no pleural effusion or pneumothorax. The right subclavian venous catheter tip lies in the region of the midportion of the SVC. There is severe degenerative change of the right shoulder which is stable.   IMPRESSION: 1. Increased interstitial markings bilaterally  have developed that suggests interstitial edema. This may be of cardiac or noncardiac cause. There is no focal pneumonia. 2. Hyperinflation is stable consistent with COPD.   Electronically Signed   By: David  [place]   On: 06/22/2013 17:16   Dg Abd 2 Views  06/23/2013   CLINICAL DATA:  Mid abdominal pain, small bowel obstruction  EXAM: ABDOMEN - 2 VIEW  COMPARISON:  Abdominal radiograph dated 06/22/2013. CT abdomen pelvis dated 06/21/2013.  FINDINGS: Nonobstructive bowel gas pattern. Prior prominent small bowel loop in the left mid abdomen is not well visualized on the current study. Right lower quadrant ostomy. Surgical sutures overlying the pelvis.  Embolization coils overlying the mid abdomen.  No evidence of free air under the diaphragm on the upright view.  IMPRESSION: No findings to suggest small bowel obstruction.  Right lower quadrant ostomy.  No free air.   Electronically Signed   By: Charline Bills M.D.   On: 06/23/2013 08:24   Dg Abd Acute W/chest  07/10/2013   CLINICAL DATA:  63 year old female with left lower quadrant pain, colostomy. History of total colectomy Nausea and vomiting. Initial encounter. Crohn disease.  EXAM: ACUTE ABDOMEN SERIES (ABDOMEN 2 VIEW & CHEST 1 VIEW)  COMPARISON:  06/23/2013 and earlier.  FINDINGS: Semi upright AP view of the chest at 0358 hrs. Chronic scarring or atelectasis. Stable lung volumes. Normal cardiac size and mediastinal contours. Visualized tracheal air column is within normal limits. No pneumothorax or pneumoperitoneum. Advanced chronic osseous changes at the right shoulder.  No pneumoperitoneum. Stable epigastric embolization coils. Right lower quadrant ostomy re- identified. Gas-filled small bowel in the left abdomen measuring up to 34 mm diameter, similar to prior studies. No other dilated loops are evident. No acute osseous abnormality identified.  IMPRESSION: 1. No free air. Stable mildly  prominent left abdominal small bowel. Right lower quadrant ostomy. 2.  No acute cardiopulmonary abnormality.   Electronically Signed   By: Augusto Gamble M.D.   On: 07/10/2013 04:17   Dg Ugi W/small Bowel  06/25/2013   CLINICAL DATA:  Frequent small bowel obstruction, Crohn's disease, abdominal pain, prior subtotal colectomy and ileostomy  EXAM: UPPER GI SERIES WITH SMALL BOWEL FOLLOW-THROUGH WITH WATER-SOLUBLE CONTRAST  TECHNIQUE: Combined double contrast and single contrast upper GI series using water-soluble contrast per referring physician request. Subsequently, serial images of the small bowel were obtained including spot views of the terminal ileum.  COMPARISON:  Abdominal radiographs 06/23/2013 and CT abdomen/ pelvis 06/21/2013  FLUOROSCOPY TIME:  2 min 9 seconds  FINDINGS: Normal bowel gas pattern on scout image.  A series of coils are arrayed in a linear fashion in the mid abdomen suggesting prior embolization though patient is unaware of such a prior procedure.  Normal esophageal distention and motility.  No esophageal stricture or obstruction.  No gastroesophageal reflux or persistent intraluminal filling defects.  Stomach distends normally with normal rugal fold pattern.  No mass, ulceration or gastric outlet obstruction.  Ligament of Treitz normal position.  Duodenal C loop grossly normal in course though suboptimally distended and opacified during the exam, no gross abnormality seen.  Normal transit of contrast through small bowel loops to the ileostomy at 1 hr.  Small bowel loops are suboptimally visualized due to use of water-soluble contrast material, no gross wall thickening or mucosal irregularity identified.  No evidence of stricture or obstruction.  A single loop in the central to right pelvis is slightly larger than remaining loops though contrast passes beyond without obstruction.  Compression  views demonstrate normal mucosal folds without focal abnormality or persistent intraluminal filling  defect.  IMPRESSION: Normal exam.  No focal bowel abnormalities or evidence of obstruction identified.   Electronically Signed   By: Ulyses Southward M.D.   On: 06/25/2013 13:12    Microbiology: No results found for this or any previous visit (from the past 240 hour(s)).   Labs: Basic Metabolic Panel:  Recent Labs Lab 07/10/13 0328 07/10/13 1620 07/11/13 0757 07/13/13 0500  NA 141  --  138 141  K 4.1  --  4.1 3.2*  CL 101  --  107 105  CO2 24  --  23 26  GLUCOSE 77  --  78 92  BUN 12  --  5* 3*  CREATININE 1.09 0.95 0.94 0.95  CALCIUM 9.6  --  8.7 9.0   Liver Function Tests:  Recent Labs Lab 07/10/13 0328  AST 12  ALT 10  ALKPHOS 113  BILITOT 0.3  PROT 7.7  ALBUMIN 3.3*    Recent Labs Lab 07/10/13 0328  LIPASE 13   No results found for this basename: AMMONIA,  in the last 168 hours CBC:  Recent Labs Lab 07/10/13 0328 07/10/13 1620 07/11/13 0757 07/13/13 0500  WBC 12.2* 10.3 6.3 6.9  NEUTROABS 9.3*  --   --   --   HGB 11.3* 10.3* 9.3* 9.7*  HCT 34.1* 31.5* 28.7* 29.3*  MCV 86.3 87.5 86.7 84.9  PLT 369 290 242 237   Cardiac Enzymes: No results found for this basename: CKTOTAL, CKMB, CKMBINDEX, TROPONINI,  in the last 168 hours BNP: BNP (last 3 results) No results found for this basename: PROBNP,  in the last 8760 hours CBG:  Recent Labs Lab 07/15/13 0825 07/15/13 1204 07/15/13 1658 07/15/13 2055 07/16/13 0809  GLUCAP 143* 155* 138* 164* 149*       Signed:  Jonette Mate N  [facility] 07/16/2013, 9:46 AM

## 2013-07-21 ENCOUNTER — Other Ambulatory Visit: Payer: Self-pay | Admitting: Critical Care Medicine

## 2013-07-21 IMAGING — CR DG CHEST 2V
2 series · 2 of 2 positions shown · non-contrast
Comparison: 10/31/2011

CLINICAL DATA: Shortness of breath for 1 week.  COPD.  Type 2
diabetes.

CHEST - 2 VIEW

[w chest pa]
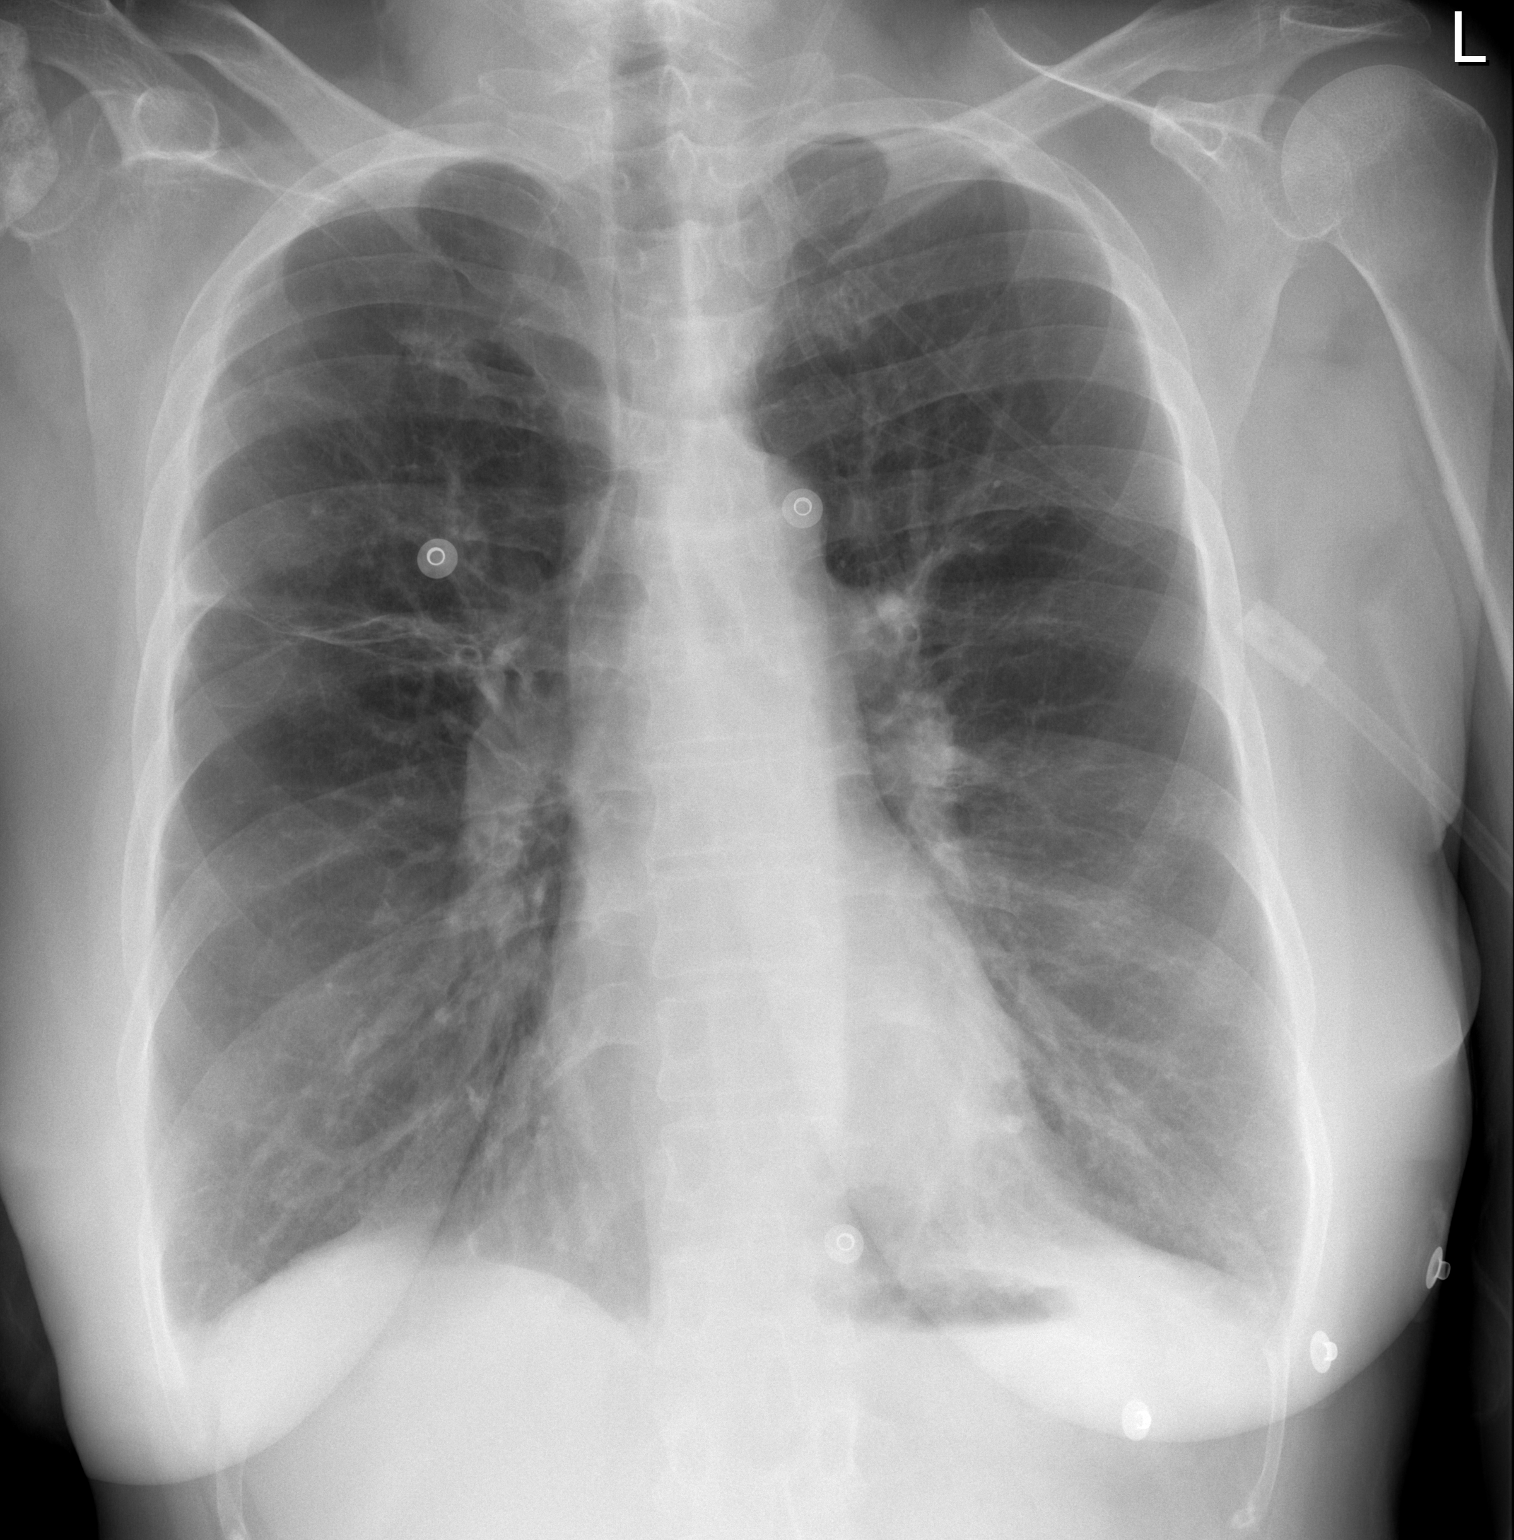

[w chest lat]
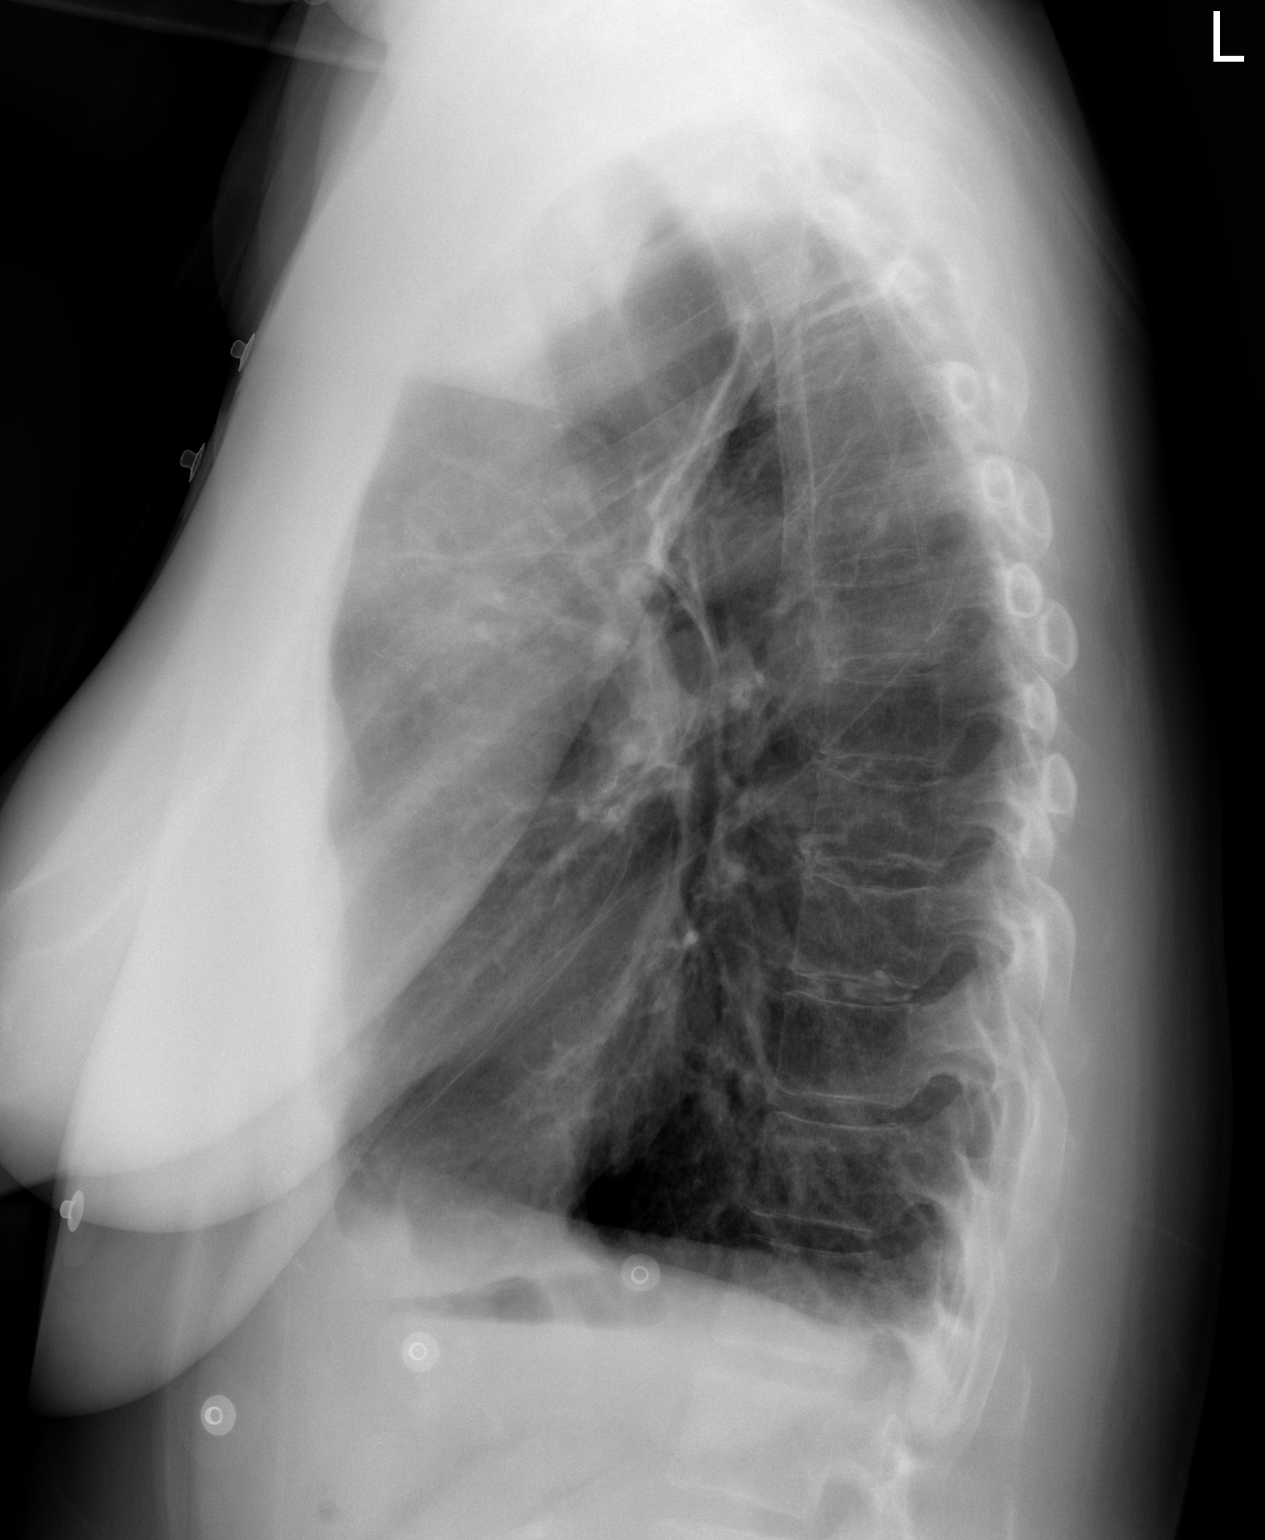

[2 of 2 positions shown; findings below may reference images not displayed]

FINDINGS: Lateral view degraded by patient arm position.

Midline trachea.  Normal heart size and mediastinal contours.
Blunting of the costophrenic angles on the frontal is felt to be
due to pleural thickening. No pneumothorax.  Mild interstitial
thickening.  More focal linear opacity in the right suprahilar
region/right upper lobe is most consistent with scarring.  The
questioned nodular density within the lateral right upper lobe on
the prior plain film is not readily apparent.  Prominent anterior
1st right rib versus right upper lobe scarring more medially.
Similar to 10/09/2009. No lobar consolidation.  There is chronic
volume loss at the left lung base.
IMPRESSION: 1.  COPD/chronic bronchitis without acute superimposed process.
2.  Foci of right upper lobe scarring.  The nodular density
questioned on 10/31/2011 is not readily apparent.  Please see that
report and consider further evaluation with chest CT to exclude
nodule in this area.

## 2013-07-21 IMAGING — CT CT ANGIO CHEST
2 of 6 series · 19 of 46 positions shown · IV contrast (APPLIED)
Comparison: Chest radiograph performed earlier today at [DATE] p.m.,
and CT of the chest performed 07/13/2008

CLINICAL DATA: Chest pain, shortness of breath and tachycardia.

CT ANGIOGRAPHY CHEST
TECHNIQUE: Multidetector CT imaging of the chest using the
standard protocol during bolus administration of intravenous
contrast. Multiplanar reconstructed images including MIPs were
obtained and reviewed to evaluate the vascular anatomy.
Contrast: 100mL OMNIPAQUE IOHEXOL 350 MG/ML SOLN

[Series 6: pulm embolism 1.0 b25f thin · axial · 0.68mm/px · z∈[+1172,+1445]mm · 16 of 301 slices shown]
[im 14/301  lung]
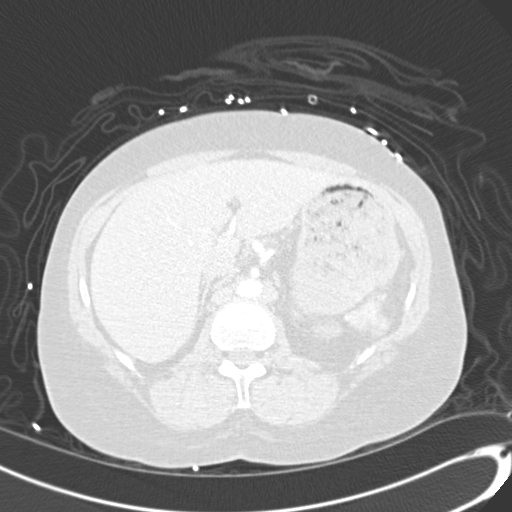
[im 40/301  soft-tissue]
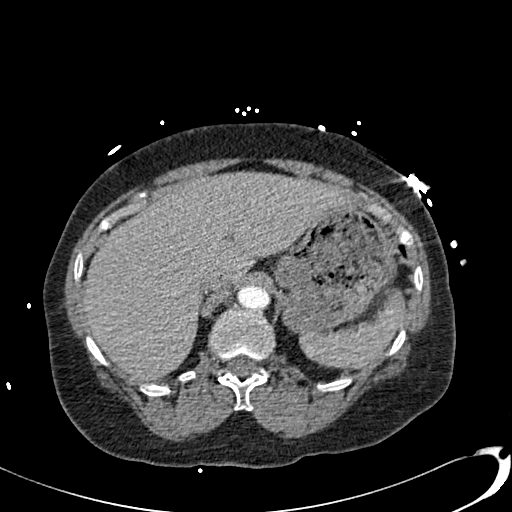
[im 53/301  lung]
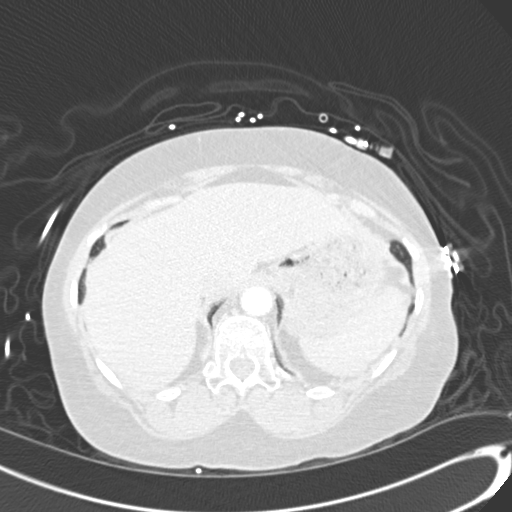
[im 66/301  soft-tissue]
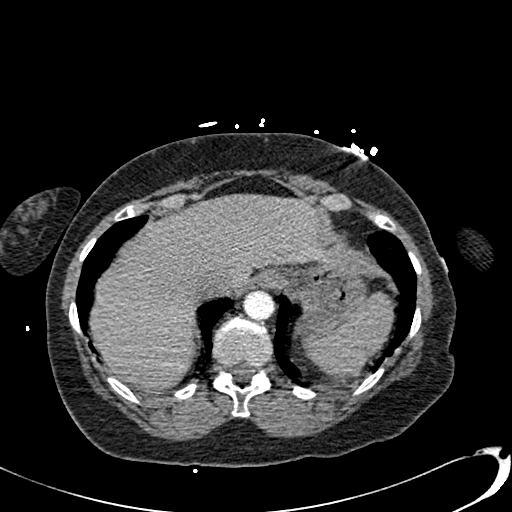
[im 92/301  lung]
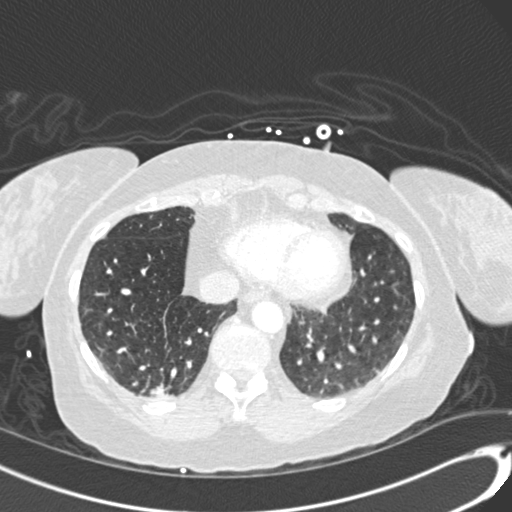
[im 105/301  soft-tissue]
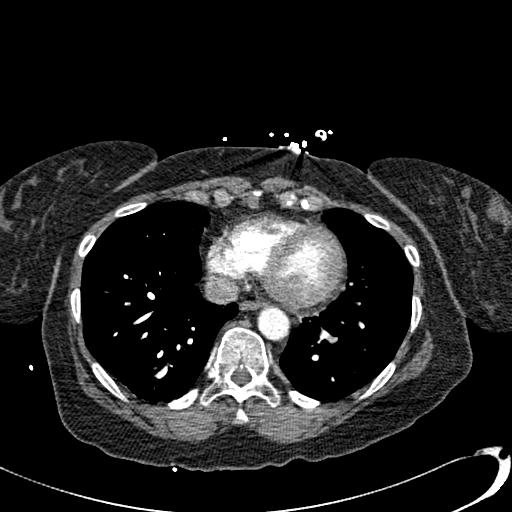
[im 118/301  lung]
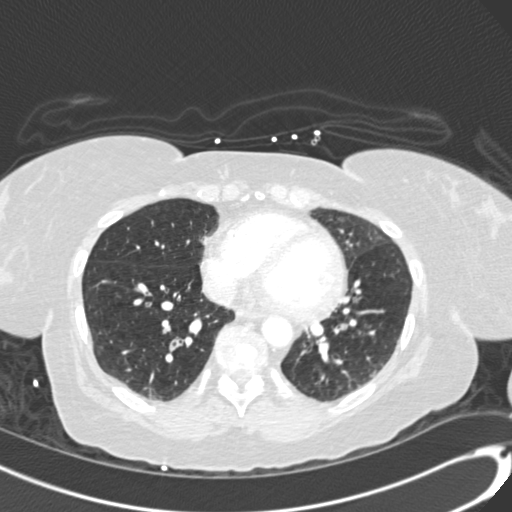
[im 144/301  soft-tissue]
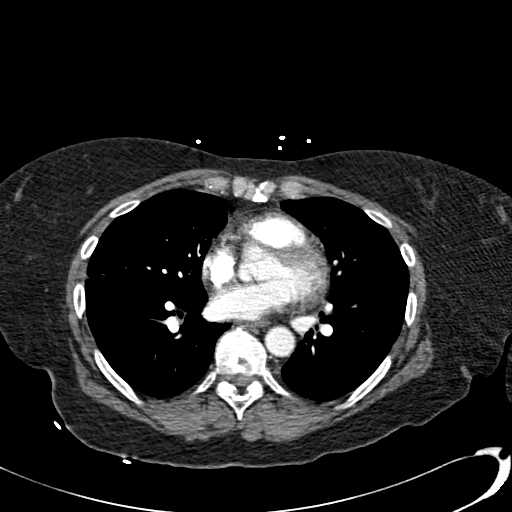
[im 157/301  lung]
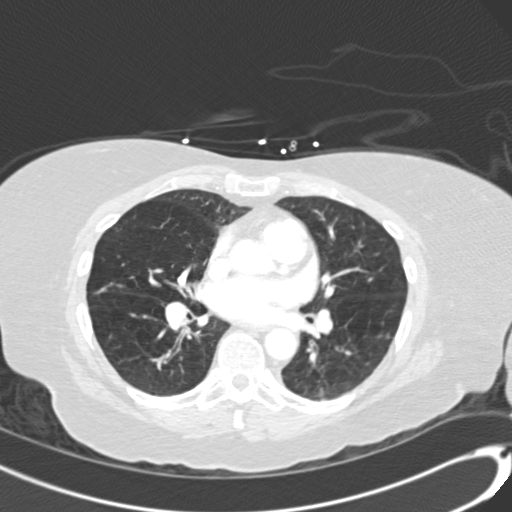
[im 183/301  soft-tissue]
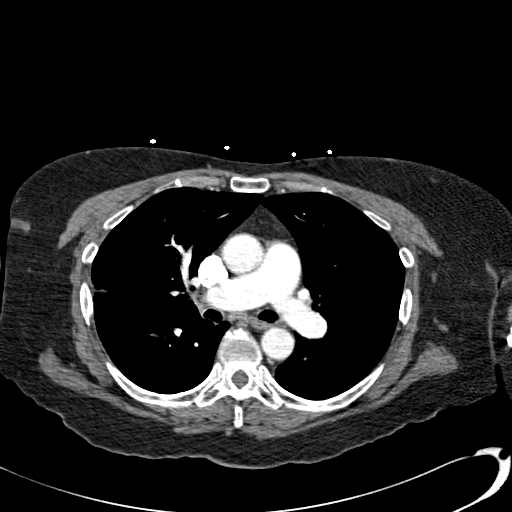
[im 196/301  lung]
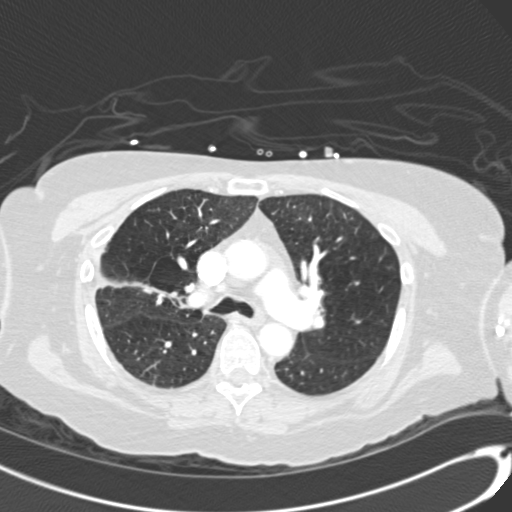
[im 209/301  soft-tissue]
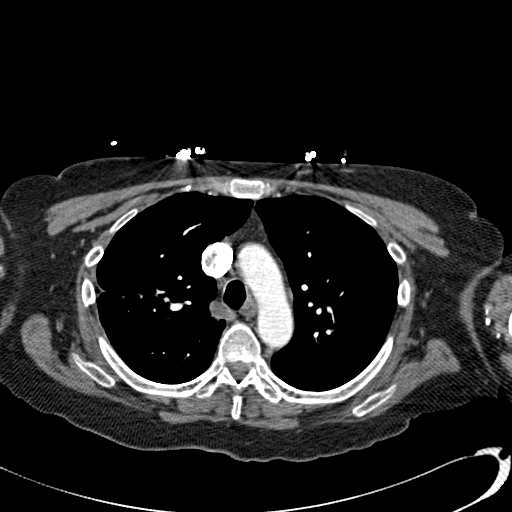
[im 235/301  lung]
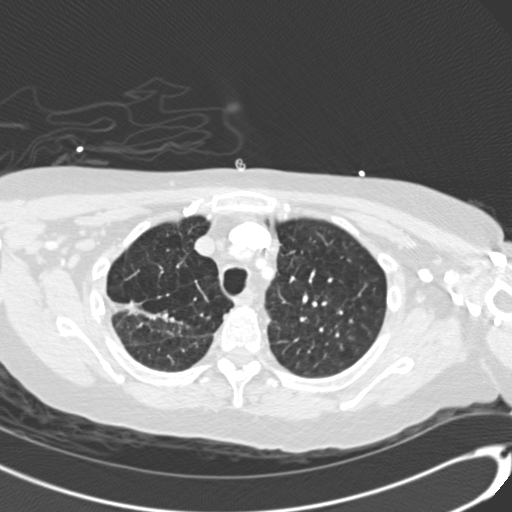
[im 248/301  soft-tissue]
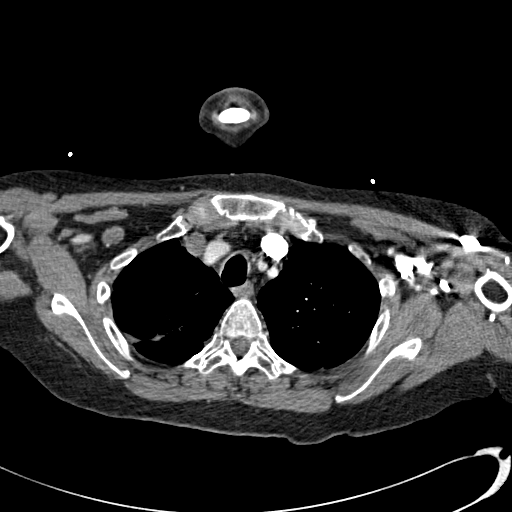
[im 261/301  lung]
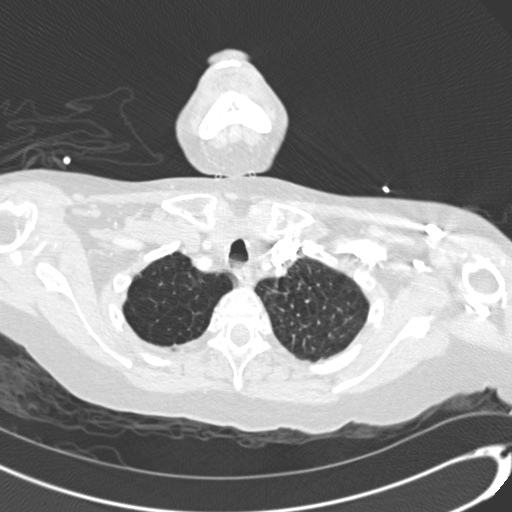
[im 287/301  soft-tissue]
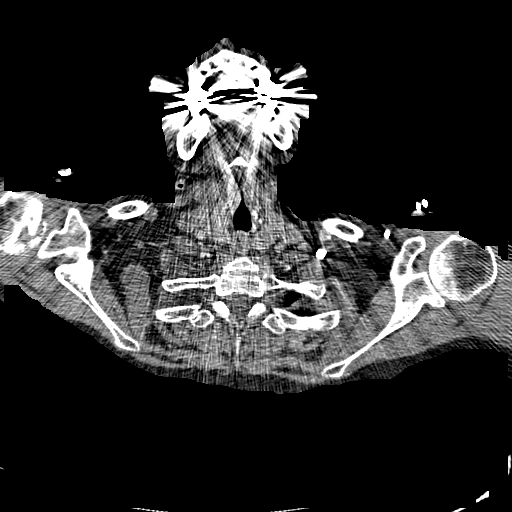

[Series 8: coronals · coronal · 0.70mm/px · 3 of 109 slices shown]
[im 28/109  soft-tissue]
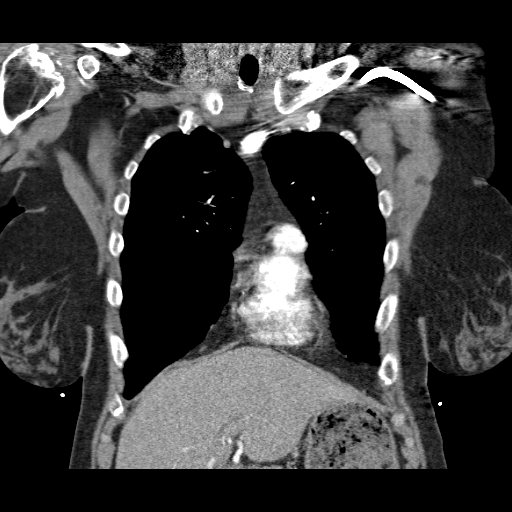
[im 55/109  soft-tissue]
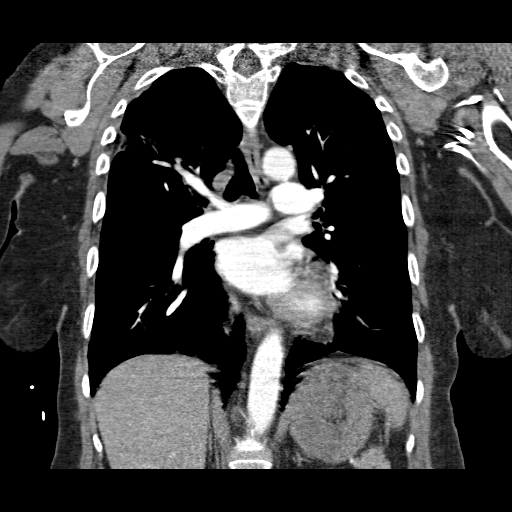
[im 82/109  soft-tissue]
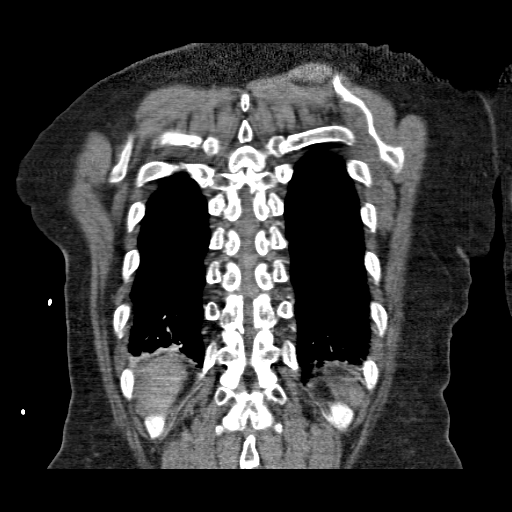

[19 of 46 positions shown; findings below may reference images not displayed]

FINDINGS: There is no evidence of pulmonary embolus.

Mild focal opacity at the right lung base likely reflects sequelae
of prior infection, given appearance on the prior CT of the chest.
There is also mild scarring within the right upper lobe.  Mild
emphysematous change is noted at the lung apices.  There is no
evidence of pleural effusion or pneumothorax.  No masses are
identified; no abnormal focal contrast enhancement is seen.

The mediastinum is unremarkable in appearance.  No mediastinal
lymphadenopathy is seen.  No pericardial effusion is identified.
The great vessels are unremarkable in appearance.  No axillary
lymphadenopathy is seen.  The visualized portions of the thyroid
gland are unremarkable in appearance.

The visualized portions of the liver and spleen are unremarkable.

No acute osseous abnormalities are seen.  Significant degenerative
change is noted at the right humeral head, with associated
fragmentation.
IMPRESSION: 1.  No evidence of pulmonary embolus.
2.  Mild focal opacity at the right lung base likely reflects
sequelae of remote infection, given appearance on the prior CT of
the chest.  Mild scarring within the right upper lobe.
3.  Mild emphysematous change at the lung apices.
4.  Significant degenerative change at the right humeral head, with
associated fragmentation.

## 2013-07-26 ENCOUNTER — Other Ambulatory Visit: Payer: Self-pay | Admitting: Gastroenterology

## 2013-07-26 DIAGNOSIS — L0291 Cutaneous abscess, unspecified: Secondary | ICD-10-CM

## 2013-07-28 ENCOUNTER — Ambulatory Visit
Admission: RE | Admit: 2013-07-28 | Discharge: 2013-07-28 | Disposition: A | Discharge status: Home / Self Care | Payer: Medicare Other | Source: Ambulatory Visit | Attending: Gastroenterology | Admitting: Gastroenterology

## 2013-07-28 DIAGNOSIS — L0291 Cutaneous abscess, unspecified: Secondary | ICD-10-CM

## 2013-07-28 MED ORDER — IOHEXOL 300 MG/ML  SOLN
100.0000 mL | Freq: Once | INTRAMUSCULAR | Status: AC | PRN
  Administered 2013-07-28: 100 mL via INTRAVENOUS

## 2013-08-02 ENCOUNTER — Emergency Department (HOSPITAL_COMMUNITY): Payer: Medicare Other

## 2013-08-02 ENCOUNTER — Encounter (HOSPITAL_COMMUNITY): Payer: Self-pay | Admitting: Emergency Medicine

## 2013-08-02 ENCOUNTER — Inpatient Hospital Stay (HOSPITAL_COMMUNITY)
Admission: EM | Admit: 2013-08-02 | Discharge: 2013-08-05 | DRG: 871 | Disposition: A | Discharge status: Home / Self Care | Payer: Medicare Other | Attending: Internal Medicine | Admitting: Internal Medicine

## 2013-08-02 DIAGNOSIS — M810 Age-related osteoporosis without current pathological fracture: Secondary | ICD-10-CM | POA: Diagnosis present

## 2013-08-02 DIAGNOSIS — N390 Urinary tract infection, site not specified: Secondary | ICD-10-CM | POA: Diagnosis present

## 2013-08-02 DIAGNOSIS — F3289 Other specified depressive episodes: Secondary | ICD-10-CM | POA: Diagnosis present

## 2013-08-02 DIAGNOSIS — A419 Sepsis, unspecified organism: Principal | ICD-10-CM | POA: Diagnosis present

## 2013-08-02 DIAGNOSIS — I10 Essential (primary) hypertension: Secondary | ICD-10-CM | POA: Diagnosis present

## 2013-08-02 DIAGNOSIS — R000 Tachycardia, unspecified: Secondary | ICD-10-CM | POA: Diagnosis present

## 2013-08-02 DIAGNOSIS — J4489 Other specified chronic obstructive pulmonary disease: Secondary | ICD-10-CM | POA: Diagnosis present

## 2013-08-02 DIAGNOSIS — J439 Emphysema, unspecified: Secondary | ICD-10-CM

## 2013-08-02 DIAGNOSIS — Z7982 Long term (current) use of aspirin: Secondary | ICD-10-CM

## 2013-08-02 DIAGNOSIS — R112 Nausea with vomiting, unspecified: Secondary | ICD-10-CM

## 2013-08-02 DIAGNOSIS — Z9981 Dependence on supplemental oxygen: Secondary | ICD-10-CM

## 2013-08-02 DIAGNOSIS — R109 Unspecified abdominal pain: Secondary | ICD-10-CM

## 2013-08-02 DIAGNOSIS — K565 Intestinal adhesions [bands], unspecified as to partial versus complete obstruction: Secondary | ICD-10-CM | POA: Diagnosis present

## 2013-08-02 DIAGNOSIS — F411 Generalized anxiety disorder: Secondary | ICD-10-CM | POA: Diagnosis present

## 2013-08-02 DIAGNOSIS — E875 Hyperkalemia: Secondary | ICD-10-CM | POA: Diagnosis present

## 2013-08-02 DIAGNOSIS — N739 Female pelvic inflammatory disease, unspecified: Secondary | ICD-10-CM | POA: Diagnosis present

## 2013-08-02 DIAGNOSIS — K219 Gastro-esophageal reflux disease without esophagitis: Secondary | ICD-10-CM | POA: Diagnosis present

## 2013-08-02 DIAGNOSIS — R627 Adult failure to thrive: Secondary | ICD-10-CM

## 2013-08-02 DIAGNOSIS — M069 Rheumatoid arthritis, unspecified: Secondary | ICD-10-CM | POA: Diagnosis present

## 2013-08-02 DIAGNOSIS — Z79899 Other long term (current) drug therapy: Secondary | ICD-10-CM

## 2013-08-02 DIAGNOSIS — Z933 Colostomy status: Secondary | ICD-10-CM

## 2013-08-02 DIAGNOSIS — F329 Major depressive disorder, single episode, unspecified: Secondary | ICD-10-CM | POA: Diagnosis present

## 2013-08-02 DIAGNOSIS — K56609 Unspecified intestinal obstruction, unspecified as to partial versus complete obstruction: Secondary | ICD-10-CM | POA: Diagnosis present

## 2013-08-02 DIAGNOSIS — Z87891 Personal history of nicotine dependence: Secondary | ICD-10-CM

## 2013-08-02 DIAGNOSIS — IMO0002 Reserved for concepts with insufficient information to code with codable children: Secondary | ICD-10-CM

## 2013-08-02 DIAGNOSIS — Z794 Long term (current) use of insulin: Secondary | ICD-10-CM

## 2013-08-02 DIAGNOSIS — J449 Chronic obstructive pulmonary disease, unspecified: Secondary | ICD-10-CM | POA: Diagnosis present

## 2013-08-02 DIAGNOSIS — E785 Hyperlipidemia, unspecified: Secondary | ICD-10-CM | POA: Diagnosis present

## 2013-08-02 DIAGNOSIS — K5090 Crohn's disease, unspecified, without complications: Secondary | ICD-10-CM | POA: Diagnosis present

## 2013-08-02 DIAGNOSIS — J438 Other emphysema: Secondary | ICD-10-CM

## 2013-08-02 DIAGNOSIS — N731 Chronic parametritis and pelvic cellulitis: Secondary | ICD-10-CM | POA: Diagnosis present

## 2013-08-02 DIAGNOSIS — E119 Type 2 diabetes mellitus without complications: Secondary | ICD-10-CM

## 2013-08-02 DIAGNOSIS — K651 Peritoneal abscess: Secondary | ICD-10-CM | POA: Diagnosis present

## 2013-08-02 DIAGNOSIS — E43 Unspecified severe protein-calorie malnutrition: Secondary | ICD-10-CM | POA: Diagnosis present

## 2013-08-02 DIAGNOSIS — Z9049 Acquired absence of other specified parts of digestive tract: Secondary | ICD-10-CM

## 2013-08-02 DIAGNOSIS — R188 Other ascites: Secondary | ICD-10-CM | POA: Diagnosis present

## 2013-08-02 LAB — URINALYSIS, ROUTINE W REFLEX MICROSCOPIC
BILIRUBIN URINE: NEGATIVE
Glucose, UA: NEGATIVE mg/dL
KETONES UR: 15 mg/dL — AB
Nitrite: NEGATIVE
PROTEIN: 30 mg/dL — AB
Specific Gravity, Urine: 1.029 (ref 1.005–1.030)
Urobilinogen, UA: 0.2 mg/dL (ref 0.0–1.0)
pH: 5.5 (ref 5.0–8.0)

## 2013-08-02 LAB — CBC
HEMATOCRIT: 36.6 % (ref 36.0–46.0)
Hemoglobin: 11.9 g/dL — ABNORMAL LOW (ref 12.0–15.0)
MCH: 27.7 pg (ref 26.0–34.0)
MCHC: 32.5 g/dL (ref 30.0–36.0)
MCV: 85.3 fL (ref 78.0–100.0)
PLATELETS: 284 10*3/uL (ref 150–400)
RBC: 4.29 MIL/uL (ref 3.87–5.11)
RDW: 14 % (ref 11.5–15.5)
WBC: 9.1 10*3/uL (ref 4.0–10.5)

## 2013-08-02 LAB — COMPREHENSIVE METABOLIC PANEL
ALK PHOS: 71 U/L (ref 39–117)
ALT: 10 U/L (ref 0–35)
AST: 11 U/L (ref 0–37)
Albumin: 2.7 g/dL — ABNORMAL LOW (ref 3.5–5.2)
BILIRUBIN TOTAL: 0.2 mg/dL — AB (ref 0.3–1.2)
BUN: 18 mg/dL (ref 6–23)
CHLORIDE: 100 meq/L (ref 96–112)
CO2: 26 mEq/L (ref 19–32)
Calcium: 9.3 mg/dL (ref 8.4–10.5)
Creatinine, Ser: 0.9 mg/dL (ref 0.50–1.10)
GFR calc non Af Amer: 67 mL/min — ABNORMAL LOW (ref >90)
GFR, EST AFRICAN AMERICAN: 77 mL/min — AB (ref >90)
GLUCOSE: 101 mg/dL — AB (ref 70–99)
POTASSIUM: 5.1 meq/L (ref 3.7–5.3)
SODIUM: 141 meq/L (ref 137–147)
TOTAL PROTEIN: 6.5 g/dL (ref 6.0–8.3)

## 2013-08-02 LAB — CBC WITH DIFFERENTIAL/PLATELET
Basophils Absolute: 0 10*3/uL (ref 0.0–0.1)
Basophils Relative: 0 % (ref 0–1)
Eosinophils Absolute: 0 10*3/uL (ref 0.0–0.7)
Eosinophils Relative: 0 % (ref 0–5)
HCT: 37.3 % (ref 36.0–46.0)
HEMOGLOBIN: 12.4 g/dL (ref 12.0–15.0)
LYMPHS ABS: 1.1 10*3/uL (ref 0.7–4.0)
LYMPHS PCT: 10 % — AB (ref 12–46)
MCH: 28.1 pg (ref 26.0–34.0)
MCHC: 33.2 g/dL (ref 30.0–36.0)
MCV: 84.4 fL (ref 78.0–100.0)
MONOS PCT: 8 % (ref 3–12)
Monocytes Absolute: 0.9 10*3/uL (ref 0.1–1.0)
NEUTROS ABS: 8.6 10*3/uL — AB (ref 1.7–7.7)
NEUTROS PCT: 82 % — AB (ref 43–77)
PLATELETS: 271 10*3/uL (ref 150–400)
RBC: 4.42 MIL/uL (ref 3.87–5.11)
RDW: 13.8 % (ref 11.5–15.5)
WBC: 10.5 10*3/uL (ref 4.0–10.5)

## 2013-08-02 LAB — BASIC METABOLIC PANEL
BUN: 16 mg/dL (ref 6–23)
CHLORIDE: 99 meq/L (ref 96–112)
CO2: 26 meq/L (ref 19–32)
Calcium: 8.7 mg/dL (ref 8.4–10.5)
Creatinine, Ser: 1.03 mg/dL (ref 0.50–1.10)
GFR calc Af Amer: 66 mL/min — ABNORMAL LOW (ref >90)
GFR calc non Af Amer: 57 mL/min — ABNORMAL LOW (ref >90)
GLUCOSE: 86 mg/dL (ref 70–99)
Potassium: 3.7 mEq/L (ref 3.7–5.3)
SODIUM: 138 meq/L (ref 137–147)

## 2013-08-02 LAB — URINE MICROSCOPIC-ADD ON

## 2013-08-02 LAB — HEMOGLOBIN A1C
Hgb A1c MFr Bld: 5.7 % — ABNORMAL HIGH (ref <5.7)
Mean Plasma Glucose: 117 mg/dL — ABNORMAL HIGH (ref <117)

## 2013-08-02 LAB — CREATININE, SERUM
Creatinine, Ser: 0.99 mg/dL (ref 0.50–1.10)
GFR calc non Af Amer: 59 mL/min — ABNORMAL LOW (ref >90)
GFR, EST AFRICAN AMERICAN: 69 mL/min — AB (ref >90)

## 2013-08-02 LAB — GLUCOSE, CAPILLARY
GLUCOSE-CAPILLARY: 89 mg/dL (ref 70–99)
GLUCOSE-CAPILLARY: 87 mg/dL (ref 70–99)
Glucose-Capillary: 102 mg/dL — ABNORMAL HIGH (ref 70–99)

## 2013-08-02 LAB — MRSA PCR SCREENING: MRSA BY PCR: NEGATIVE

## 2013-08-02 LAB — LIPASE, BLOOD: LIPASE: 12 U/L (ref 11–59)

## 2013-08-02 MED ORDER — DILTIAZEM HCL ER COATED BEADS 180 MG PO CP24
180.0000 mg | ORAL_CAPSULE | Freq: Two times a day (BID) | ORAL | Status: DC
  Administered 2013-08-02 – 2013-08-03 (×2): 180 mg via ORAL
  Filled 2013-08-02 (×5): qty 1

## 2013-08-02 MED ORDER — DILTIAZEM HCL ER BEADS 180 MG PO CP24: 180.0000 mg | ORAL_CAPSULE | Freq: Two times a day (BID) | ORAL | Status: DC

## 2013-08-02 MED ORDER — TIOTROPIUM BROMIDE MONOHYDRATE 18 MCG IN CAPS
18.0000 ug | ORAL_CAPSULE | Freq: Every day | RESPIRATORY_TRACT | Status: DC
  Filled 2013-08-02: qty 5

## 2013-08-02 MED ORDER — ONDANSETRON HCL 4 MG/2ML IJ SOLN
INTRAMUSCULAR | Status: AC
Start: 2013-08-02 — End: 2013-08-02
  Administered 2013-08-02: 07:00:00
  Filled 2013-08-02: qty 2

## 2013-08-02 MED ORDER — HYDROCODONE-ACETAMINOPHEN 5-325 MG PO TABS
1.0000 | ORAL_TABLET | Freq: Four times a day (QID) | ORAL | Status: DC | PRN
  Administered 2013-08-02: 1 via ORAL
  Administered 2013-08-03: 2 via ORAL
  Filled 2013-08-02: qty 2
  Filled 2013-08-02: qty 1

## 2013-08-02 MED ORDER — CALCIUM CARBONATE-VITAMIN D 500-200 MG-UNIT PO TABS
1.0000 | ORAL_TABLET | Freq: Two times a day (BID) | ORAL | Status: DC
  Administered 2013-08-02 – 2013-08-05 (×7): 1 via ORAL
  Filled 2013-08-02 (×8): qty 1

## 2013-08-02 MED ORDER — LORAZEPAM 0.5 MG PO TABS
0.5000 mg | ORAL_TABLET | Freq: Two times a day (BID) | ORAL | Status: DC
  Administered 2013-08-02 – 2013-08-05 (×7): 0.5 mg via ORAL
  Filled 2013-08-02 (×7): qty 1

## 2013-08-02 MED ORDER — PREDNISONE 20 MG PO TABS
40.0000 mg | ORAL_TABLET | Freq: Every day | ORAL | Status: DC
  Administered 2013-08-03 – 2013-08-05 (×3): 40 mg via ORAL
  Filled 2013-08-02 (×4): qty 2

## 2013-08-02 MED ORDER — ATORVASTATIN CALCIUM 40 MG PO TABS
40.0000 mg | ORAL_TABLET | Freq: Every day | ORAL | Status: DC
  Administered 2013-08-02 – 2013-08-04 (×3): 40 mg via ORAL
  Filled 2013-08-02 (×4): qty 1

## 2013-08-02 MED ORDER — TRAMADOL HCL 50 MG PO TABS: 50.0000 mg | ORAL_TABLET | Freq: Four times a day (QID) | ORAL | Status: DC | PRN

## 2013-08-02 MED ORDER — BIOTENE DRY MOUTH MT LIQD
15.0000 mL | Freq: Two times a day (BID) | OROMUCOSAL | Status: DC
  Administered 2013-08-02 – 2013-08-05 (×5): 15 mL via OROMUCOSAL

## 2013-08-02 MED ORDER — PRO-STAT SUGAR FREE PO LIQD
30.0000 mL | Freq: Every day | ORAL | Status: DC
  Administered 2013-08-03 – 2013-08-04 (×2): 30 mL via ORAL

## 2013-08-02 MED ORDER — PANTOPRAZOLE SODIUM 40 MG PO TBEC
40.0000 mg | DELAYED_RELEASE_TABLET | Freq: Every day | ORAL | Status: DC
  Administered 2013-08-02 – 2013-08-05 (×4): 40 mg via ORAL
  Filled 2013-08-02 (×4): qty 1

## 2013-08-02 MED ORDER — ONDANSETRON HCL 4 MG/2ML IJ SOLN
4.0000 mg | Freq: Once | INTRAMUSCULAR | Status: AC
Start: 2013-08-02 — End: 2013-08-02
  Administered 2013-08-02: 4 mg via INTRAVENOUS
  Filled 2013-08-02: qty 2

## 2013-08-02 MED ORDER — FENTANYL CITRATE 0.05 MG/ML IJ SOLN
100.0000 ug | Freq: Once | INTRAMUSCULAR | Status: AC
  Administered 2013-08-02: 100 ug via INTRAVENOUS
  Filled 2013-08-02: qty 2

## 2013-08-02 MED ORDER — ARFORMOTEROL TARTRATE 15 MCG/2ML IN NEBU
15.0000 ug | INHALATION_SOLUTION | Freq: Two times a day (BID) | RESPIRATORY_TRACT | Status: DC
  Filled 2013-08-02 (×2): qty 2

## 2013-08-02 MED ORDER — PIPERACILLIN-TAZOBACTAM 3.375 G IVPB
3.3750 g | Freq: Three times a day (TID) | INTRAVENOUS | Status: DC
  Administered 2013-08-02 – 2013-08-05 (×8): 3.375 g via INTRAVENOUS
  Filled 2013-08-02 (×10): qty 50

## 2013-08-02 MED ORDER — FLUTICASONE PROPIONATE 50 MCG/ACT NA SUSP: 2.0000 | Freq: Every day | NASAL | Status: DC | PRN

## 2013-08-02 MED ORDER — PIPERACILLIN-TAZOBACTAM 3.375 G IVPB
3.3750 g | Freq: Once | INTRAVENOUS | Status: AC
  Administered 2013-08-02: 3.375 g via INTRAVENOUS
  Filled 2013-08-02: qty 50

## 2013-08-02 MED ORDER — TIOTROPIUM BROMIDE MONOHYDRATE 18 MCG IN CAPS
18.0000 ug | ORAL_CAPSULE | Freq: Every day | RESPIRATORY_TRACT | Status: DC
  Administered 2013-08-03 – 2013-08-05 (×3): 18 ug via RESPIRATORY_TRACT
  Filled 2013-08-02: qty 5

## 2013-08-02 MED ORDER — CITALOPRAM HYDROBROMIDE 20 MG PO TABS
20.0000 mg | ORAL_TABLET | Freq: Every day | ORAL | Status: DC
  Administered 2013-08-02 – 2013-08-05 (×4): 20 mg via ORAL
  Filled 2013-08-02 (×4): qty 1

## 2013-08-02 MED ORDER — ALBUTEROL SULFATE HFA 108 (90 BASE) MCG/ACT IN AERS: 2.0000 | INHALATION_SPRAY | Freq: Four times a day (QID) | RESPIRATORY_TRACT | Status: DC | PRN

## 2013-08-02 MED ORDER — ARFORMOTEROL TARTRATE 15 MCG/2ML IN NEBU
15.0000 ug | INHALATION_SOLUTION | Freq: Two times a day (BID) | RESPIRATORY_TRACT | Status: DC
  Administered 2013-08-02 – 2013-08-05 (×6): 15 ug via RESPIRATORY_TRACT
  Filled 2013-08-02 (×8): qty 2

## 2013-08-02 MED ORDER — HYDROMORPHONE HCL PF 1 MG/ML IJ SOLN
1.0000 mg | Freq: Once | INTRAMUSCULAR | Status: AC
  Administered 2013-08-02: 1 mg via INTRAVENOUS
  Filled 2013-08-02: qty 1

## 2013-08-02 MED ORDER — HEPARIN SODIUM (PORCINE) 5000 UNIT/ML IJ SOLN
5000.0000 [IU] | Freq: Three times a day (TID) | INTRAMUSCULAR | Status: DC
  Administered 2013-08-02 – 2013-08-05 (×9): 5000 [IU] via SUBCUTANEOUS
  Filled 2013-08-02 (×12): qty 1

## 2013-08-02 MED ORDER — ONDANSETRON HCL 4 MG/2ML IJ SOLN
4.0000 mg | Freq: Once | INTRAMUSCULAR | Status: AC
  Administered 2013-08-02: 4 mg via INTRAVENOUS

## 2013-08-02 MED ORDER — ALBUTEROL SULFATE (2.5 MG/3ML) 0.083% IN NEBU: 2.5000 mg | INHALATION_SOLUTION | Freq: Four times a day (QID) | RESPIRATORY_TRACT | Status: DC | PRN

## 2013-08-02 MED ORDER — HYDROXYCHLOROQUINE SULFATE 200 MG PO TABS
200.0000 mg | ORAL_TABLET | Freq: Two times a day (BID) | ORAL | Status: DC
  Administered 2013-08-02 – 2013-08-05 (×7): 200 mg via ORAL
  Filled 2013-08-02 (×8): qty 1

## 2013-08-02 MED ORDER — FENTANYL CITRATE 0.05 MG/ML IJ SOLN
50.0000 ug | Freq: Once | INTRAMUSCULAR | Status: AC
  Administered 2013-08-02: 50 ug via INTRAVENOUS
  Filled 2013-08-02: qty 2

## 2013-08-02 MED ORDER — FAMOTIDINE 20 MG PO TABS
20.0000 mg | ORAL_TABLET | Freq: Two times a day (BID) | ORAL | Status: DC
  Administered 2013-08-02 – 2013-08-05 (×7): 20 mg via ORAL
  Filled 2013-08-02 (×8): qty 1

## 2013-08-02 MED ORDER — ASPIRIN EC 81 MG PO TBEC
81.0000 mg | DELAYED_RELEASE_TABLET | ORAL | Status: DC
  Administered 2013-08-03 – 2013-08-05 (×2): 81 mg via ORAL
  Filled 2013-08-02 (×2): qty 1

## 2013-08-02 MED ORDER — ONDANSETRON HCL 4 MG PO TABS: 4.0000 mg | ORAL_TABLET | Freq: Four times a day (QID) | ORAL | Status: DC | PRN

## 2013-08-02 MED ORDER — INSULIN ASPART 100 UNIT/ML ~~LOC~~ SOLN
0.0000 [IU] | SUBCUTANEOUS | Status: DC
  Administered 2013-08-03: 2 [IU] via SUBCUTANEOUS
  Administered 2013-08-03: 1 [IU] via SUBCUTANEOUS
  Administered 2013-08-03: 2 [IU] via SUBCUTANEOUS
  Administered 2013-08-04: 1 [IU] via SUBCUTANEOUS

## 2013-08-02 MED ORDER — ALBUTEROL SULFATE HFA 108 (90 BASE) MCG/ACT IN AERS
2.0000 | INHALATION_SPRAY | Freq: Four times a day (QID) | RESPIRATORY_TRACT | Status: DC | PRN
  Filled 2013-08-02: qty 6.7

## 2013-08-02 MED ORDER — BUDESONIDE 0.25 MG/2ML IN SUSP
0.2500 mg | Freq: Two times a day (BID) | RESPIRATORY_TRACT | Status: DC
  Administered 2013-08-02 – 2013-08-05 (×6): 0.25 mg via RESPIRATORY_TRACT
  Filled 2013-08-02 (×8): qty 2

## 2013-08-02 MED ORDER — ONDANSETRON HCL 4 MG/2ML IJ SOLN
4.0000 mg | Freq: Four times a day (QID) | INTRAMUSCULAR | Status: DC | PRN
  Administered 2013-08-02 (×2): 4 mg via INTRAVENOUS
  Filled 2013-08-02 (×2): qty 2

## 2013-08-02 MED ORDER — MORPHINE SULFATE 2 MG/ML IJ SOLN
1.0000 mg | INTRAMUSCULAR | Status: DC | PRN
  Administered 2013-08-02 (×2): 1 mg via INTRAVENOUS
  Filled 2013-08-02 (×2): qty 1

## 2013-08-02 MED ORDER — SODIUM CHLORIDE 0.9 % IV BOLUS (SEPSIS)
500.0000 mL | Freq: Once | INTRAVENOUS | Status: AC
  Administered 2013-08-02: 500 mL via INTRAVENOUS

## 2013-08-02 MED ORDER — PRO-STAT SUGAR FREE PO LIQD: 30.0000 mL | Freq: Every day | ORAL | Status: DC

## 2013-08-02 MED ORDER — DEXTROSE-NACL 5-0.45 % IV SOLN
INTRAVENOUS | Status: DC
  Administered 2013-08-02 – 2013-08-04 (×3): via INTRAVENOUS

## 2013-08-02 NOTE — H&P (Signed)
[facility] History and Physical  [name] LOV:[id number] DOB: 19-Apr-1951 DOA: 08/02/2013  Referring physician: Dr. PCP: [name], MD   Chief Complaint: Nausea vomiting and abdominal pain  HPI: [name] is a 63 y.o. female  Past medical history of necrotizing pneumonia that required extended ventilation and a tracheostomy, Crohn's disease on chronic steroids, status post colectomy with an ileostomy, complicated by multiple intra-abdominal abscesses his for several months requiring repeat intervention and  multiple admissions for abdominal pain and obstruction since October on Bactrim, diabetes mellitus type 2 hemoglobin A1c of 5.1 comes in for diffuse abdominal pain nausea and vomiting that started 6-7 days prior to admissions with fevers at home subjective and chills. She relates she has not been able to keep anything up for more than 2 days. She feels very tired and weak and myalgias. She has been putting out more liquid stool and output from her ostomy has increased. She had a previous CT on 07/28/2013 by her gastroenterologist that showed a neurology disease she was referred to the emergency room.   In the ED: Basic metabolic panel was done that showed a potassium of 5.1 a creatinine at baseline CBC within the differential showed a white count of 10.5 with an ANC of 8.6 UA shows specific gravity of 1022 most count white blood cells in her urine, abdominal x-ray showed possibles paucity of  small boweldilated bowel loop in the left lower quadrant up to 4.7 cm.    Review of Systems:  Constitutional:  No weight loss, night sweats, Fevers, chills, fatigue.  HEENT:  No headaches, Difficulty swallowing,Tooth/dental problems,Sore throat,  No sneezing, itching, ear ache, nasal congestion, post nasal drip,  Cardio-vascular:  No chest pain, Orthopnea, PND, swelling in lower extremities, anasarca, dizziness, palpitations  GI:  No heartburn, indigestion, abdominal  pain, nausea, vomiting, diarrhea, change in bowel habits, loss of appetite  Resp:  No shortness of breath with exertion or at rest. No excess mucus, no productive cough, No non-productive cough, No coughing up of blood.No change in color of mucus.No wheezing.No chest wall deformity  Skin:  no rash or lesions.  GU:  no dysuria, change in color of urine, no urgency or frequency. No flank pain.  Musculoskeletal:  No joint pain or swelling. No decreased range of motion. No back pain.  Psych:  No change in mood or affect. No depression or anxiety. No memory loss.   Past Medical History  Diagnosis Date  . Other diseases of vocal cords   . Osteoporosis, unspecified   . Other and unspecified hyperlipidemia   . Esophageal reflux   . Chronic airway obstruction, not elsewhere classified   . Regional enteritis of unspecified site   . 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 Crohn's disease     Ileostomy  . Pelvic adhesions   . Anxiety   . Depression   . Hypertension   . Emphysema   . Hematuria   . Severe cervical dysplasia age 83    cone biopsy with neg paps since  . Pneumonia     "3-4 times; necrotizing once" (04/19/2013)  . Asthmatic bronchitis , chronic   . Exertional shortness of breath   . On home oxygen therapy     "24/7; 2.5L @ rest; 3L when I'm doing my chores" (04/19/2013)  . Type II diabetes mellitus   . Anemia   . History of blood transfusion     "I've had 3 or 4" (04/19/2013)  . Rheumatoid arthritis(714.0) 03/2012  [place]; "mild in my hands" (04/19/2013)  . Small bowel obstruction due to adhesions 2014    [place]  . Pelvic abscess in female 2014   Past Surgical History  Procedure Laterality Date  . Ileostomy  2007  . Ear cyst excision Bilateral 1970's    "lobes"   . Cervical cone biopsy  1978  . Colon surgery  2007    subtotal colectomy with ileostomy  . Esophagogastroduodenoscopy (egd) with esophageal dilation      "3 times, I think"  (04/19/2013)  . Oophorectomy      LSO  . Tracheostomy  2010  . Hammer toe surgery Bilateral 1980's  . Balloon dilation  02/10/2012    Procedure: BALLOON DILATION;  Surgeon: Charolett Bumpers, MD;  Location: [facility] ENDOSCOPY;  Service: Endoscopy;  Laterality: N/A;  . Colposcopy    . Flexible sigmoidoscopy  03/26/2012    Procedure: FLEXIBLE SIGMOIDOSCOPY;  Surgeon: Charolett Bumpers, MD;  Location: [facility] ENDOSCOPY;  Service: Endoscopy;  Laterality: N/A;  . Cardiac catheterization  07/08/2007    normal  . Nm myocar perf wall motion  09/21/2006    no ischemia  . Ventral hernia repair N/A 03/16/2013    Procedure: REPAIR OF INCISIONAL AND PARASTOMAL HERNIAS;  Surgeon: Mariella Saa, MD;  Location: [facility] OR;  Service: General;  Laterality: N/A;  . Laparotomy N/A 03/16/2013    Procedure: EXPLORATORY LAPAROTOMY;  Surgeon: Mariella Saa, MD;  Location[facility] OR;  Service: General;  Laterality: N/A;  . Lysis of adhesion N/A 03/16/2013    Procedure: LYSIS OF ADHESIONS FOR SMALL BOWEL OBSTRUCTION;  Surgeon: Mariella Saa, MD;  Location: [facility] OR;  Service: General;  Laterality: N/A;  . Insertion of mesh N/A 03/16/2013    Procedure: INSERTION OF BIOLOGIC MESH;  Surgeon: Mariella Saa, MD;  Location: [facility] OR;  Service: General;  Laterality: N/A;  . Tee without cardioversion N/A 03/21/2013    Procedure: TRANSESOPHAGEAL ECHOCARDIOGRAM (TEE);  Surgeon: Vesta Mixer, MD;  Location: [facility] ENDOSCOPY;  Service: Cardiovascular;  Laterality: N/A;  . Vaginal hysterectomy  1993    LAVH-LSO  . Tracheostomy closure  2010   Social History:  reports that she quit smoking about 9 years ago. Her smoking use included Cigarettes. She has a 32 pack-year smoking history. She has never used smokeless tobacco. She reports that she drinks alcohol. She reports that she does not use illicit drugs.  Allergies  Allergen Reactions  . Esomeprazole Magnesium Other (See Comments)    NEXIUM - reaction > aggravated pt's Crohn's  disease  . Shellfish Allergy Shortness Of Breath and Nausea And Vomiting  . Surgical Lubricant Other (See Comments)    Burns skin   . Other Nausea And Vomiting    Beans, Dander/Dust, Peas, Mushrooms  . Peanut-Containing Drug Products Nausea And Vomiting  . Sulfate     N/V  . Levaquin [Levofloxacin]     Family History  Problem Relation Age of Onset  . Osteoporosis Mother   . Kidney disease Mother   . Hypertension Sister   . Stroke Sister   . Diabetes Sister      Prior to Admission medications   Medication Sig Start Date End Date Taking? Authorizing Provider  Amino Acids-Protein Hydrolys (FEEDING SUPPLEMENT, PRO-STAT SUGAR FREE 64,) LIQD Take 30 mLs by mouth daily. 07/06/13  Yes Mariella Saa, MD  arformoterol (BROVANA) 15 MCG/2ML NEBU Take 15 mcg by nebulization 2 (two) times daily.   Yes Historical Provider, MD  aspirin 81  MG tablet Take 81 mg by mouth every Monday, Wednesday, and Friday.    Yes Historical Provider, MD  budesonide (PULMICORT) 0.25 MG/2ML nebulizer solution Take 0.25 mg by nebulization 2 (two) times daily.   Yes Historical Provider, MD  calcium-vitamin D (OSCAL WITH D) 500-200 MG-UNIT per tablet Take 1 tablet by mouth 2 (two) times daily.   Yes Historical Provider, MD  Cholecalciferol (VITAMIN D) 1000 UNITS capsule Take 1,000 Units by mouth daily.    Yes Historical Provider, MD  citalopram (CELEXA) 20 MG tablet Take 20 mg by mouth daily.    Yes Historical Provider, MD  diltiazem (TIAZAC) 180 MG 24 hr capsule Take 180 mg by mouth 2 (two) times daily.   Yes Historical Provider, MD  famotidine (PEPCID) 20 MG tablet Take 20 mg by mouth 2 (two) times daily.   Yes Historical Provider, MD  fluticasone (FLONASE) 50 MCG/ACT nasal spray Place 2 sprays into the nose daily as needed.    Yes Historical Provider, MD  guaiFENesin (MUCINEX) 600 MG 12 hr tablet Take 1,200 mg by mouth 2 (two) times daily.   Yes Historical Provider, MD  HYDROcodone-acetaminophen (NORCO/VICODIN)  5-325 MG per tablet Take 1-2 tablets by mouth every 6 (six) hours as needed for moderate pain or severe pain. 06/27/13  Yes Rodolph Bong, MD  hydroxychloroquine (PLAQUENIL) 200 MG tablet Take 200 mg by mouth Twice daily.  04/20/12  Yes Historical Provider, MD  IRON PO Take 1 tablet by mouth every Monday, Wednesday, and Friday.    Yes Historical Provider, MD  Liraglutide (VICTOZA) 18 MG/3ML SOLN Inject 1.8 mg into the skin daily. 1.8 units once daily   Yes Historical Provider, MD  LORazepam (ATIVAN) 0.5 MG tablet TAKE 1 TABLET BY MOUTH EVERY 8 HOURS AS NEEDED   Yes Storm Frisk, MD  omeprazole (PRILOSEC) 40 MG capsule Take 40 mg by mouth 2 (two) times daily.    Yes Historical Provider, MD  ondansetron (ZOFRAN) 4 MG tablet Take 1 tablet (4 mg total) by mouth every 6 (six) hours as needed for nausea. 04/29/13  Yes Leroy Sea, MD  predniSONE (DELTASONE) 20 MG tablet Take 2 tablets (40 mg total) by mouth daily with breakfast. 07/16/13  Yes Esperanza Sheets, MD  PROAIR HFA 108 (90 BASE) MCG/ACT inhaler Inhale 2 puffs into the lungs every 6 (six) hours as needed for wheezing or shortness of breath.    Yes Historical Provider, MD  Respiratory Therapy Supplies (FLUTTER) DEVI Inhale into the lungs. 2-4 times daily   Yes Historical Provider, MD  rosuvastatin (CRESTOR) 5 MG tablet Take 5 mg by mouth every Monday, Wednesday, and Friday.    Yes Historical Provider, MD  sulfamethoxazole-trimethoprim (BACTRIM DS) 800-160 MG per tablet Take 1 tablet by mouth 2 (two) times daily. 08/01/13  Yes Historical Provider, MD  tiotropium (SPIRIVA) 18 MCG inhalation capsule Place 1 capsule (18 mcg total) into inhaler and inhale daily. 07/06/13  Yes Storm Frisk, MD  traMADol (ULTRAM) 50 MG tablet Take 1 tablet (50 mg total) by mouth every 6 (six) hours as needed for moderate pain or severe pain. 07/16/13  Yes Esperanza Sheets, MD   Physical Exam: Filed Vitals:   08/02/13 0845  BP: 118/63  Pulse: 107  Temp:     Resp:     BP 118/63  Pulse 107  Temp(Src) 98.9 F (37.2 C) (Oral)  Resp 16  SpO2 100%  General:  Appears calm and comfortable Eyes: PERRL, normal lids,  irises & conjunctiva ENT: grossly normal hearing, lips & tongue Neck: no LAD, masses or thyromegaly Cardiovascular: RRR, no m/r/g. No LE edema. Telemetry: SR, no arrhythmias  Respiratory: CTA bilaterally, no w/r/r. Normal respiratory effort. Abdomen: soft, diffuse tenderness no rebound or guarding but exquisite tenderness in the left lower quadrant Skin: no rash or induration seen on limited exam Musculoskeletal: grossly normal tone BUE/BLE Psychiatric: grossly normal mood and affect, speech fluent and appropriate Neurologic: grossly non-focal.          Labs on Admission:  Basic Metabolic Panel:  Recent Labs Lab 08/02/13 0305  NA 141  K 5.1  CL 100  CO2 26  GLUCOSE 101*  BUN 18  CREATININE 0.90  CALCIUM 9.3   Liver Function Tests:  Recent Labs Lab 08/02/13 0305  AST 11  ALT 10  ALKPHOS 71  BILITOT 0.2*  PROT 6.5  ALBUMIN 2.7*    Recent Labs Lab 08/02/13 0305  LIPASE 12   No results found for this basename: AMMONIA,  in the last 168 hours CBC:  Recent Labs Lab 08/02/13 0305  WBC 10.5  NEUTROABS 8.6*  HGB 12.4  HCT 37.3  MCV 84.4  PLT 271   Cardiac Enzymes: No results found for this basename: CKTOTAL, CKMB, CKMBINDEX, TROPONINI,  in the last 168 hours  BNP (last 3 results) No results found for this basename: PROBNP,  in the last 8760 hours CBG: No results found for this basename: GLUCAP,  in the last 168 hours  Radiological Exams on Admission: Dg Abd Acute W/chest  08/02/2013   CLINICAL DATA:  Abdominal and chest pain, Crohn history.  EXAM: ACUTE ABDOMEN SERIES (ABDOMEN 2 VIEW & CHEST 1 VIEW)  COMPARISON:  07/28/2013 CT, 07/10/2013 abdominal series  FINDINGS: Lungs remain hypoaerated with large right apical bullae. No confluent airspace opacity. Cardiomediastinal contours within normal  range.  No free intraperitoneal air. Relative paucity of bowel gas other than a dilated loop of small bowel up to 4.7 cm within the left lower quadrant. Right lower quadrant ileostomy. Evidence of prior embolization perhaps along the gastroduodenal artery. No acute osseous finding.  IMPRESSION: Relative paucity of bowel gas other than a dilated bowel loop in the left lower quadrant up to 4.7 cm. Small-bowel obstruction not excluded.   Electronically Signed   By: Jearld Lesch M.D.   On: 08/02/2013 06:12    EKG: Independently reviewed. pending  Assessment/Plan Pelvic abscess in female/CROHN'S DISEASE/sepsis: - I will place her n.p.o., give Zofran, IV fluids and narcotics for pain. His previous CT scan from 08/02/2013 showed an enlarging abscess, she also tachycardia with a left shift in her CBC has subjective fevers myalgias, nausea, increased output from her ostomy, and exquisitely tender in the left lower quadrant makes me concerned for sepsis. - I will consult surgery, start her on Zosyn IV.  Mild Hyperkalemia: - I will place her n.p.o. start her on IV fluids check an EKG does mostly secondary to decreased intravascular volume and or intrapelvic abscess.  DIABETES, TYPE 2 She is currently n.p.o. S. hemoglobin A1c was 5.1, was on sliding scale insulin.  Severe protein-calorie malnutrition: - And short 3 times a day consult nutrition.  Rheumatoid arthritis: Continue steroids and Plaquenil.    Code Status: full Family Communication: none Disposition Plan: inpateint  Time spent: 80 minutes  Marinda Elk [facility] Pager [phone]

## 2013-08-02 NOTE — Plan of Care (Signed)
Problem: Phase I Progression Outcomes Goal: Pain controlled with appropriate interventions Outcome: Progressing Alternates between PO and IV pain medications.

## 2013-08-02 NOTE — ED Notes (Signed)
Spoke with Dr. Radonna Ricker who reports he is coming down to see patient and pt can eat.

## 2013-08-02 NOTE — Progress Notes (Signed)
Admission note: Late Entry  Arrival Method: Via stretcher from ED approx 1215. Mental Status: A&OX4 Telemetry: N/A  Skin: Intact.   Tubes: RLQ ileostomy. 2 piece. IV: LAC 08/02/13 IVF infusing. Pt requested to have new PIV inserted to stop the pump from beeping. New PIV inserted to [facility] by IV team. Pain: Will medicate with PRN meds as needed. Family: Visitor at bedside. Living Situation: From home. Safety Measures: Patient is moderate fall risk. Fall prevention safety plan reviewed. Declined to watch safety video. Call bell within reach. Patient able to ambulate independently. 6E Orientation: Oriented to unit and surroundings. Patient states she's very familiar with being in the hospital.   Addititional: Patient requesting to have all PO meds changed to IV as she is currently NPO. Spoke to Dr. David Stall who stated patient is able to take PO meds with sips of water. No new orders.  [name], [name] [name]

## 2013-08-02 NOTE — ED Notes (Signed)
General surgery at bedside. 

## 2013-08-02 NOTE — ED Notes (Signed)
Patient transported to X-ray 

## 2013-08-02 NOTE — Progress Notes (Signed)
Patient ID: [name], female   DOB: 1950/10/08, 63 y.o.   MRN: [id number]    Subjective: Pt well-known to our service for recent history with ex lap LOA, repair of parastomal hernia, who has subsequently had repeated admissions for nausea and vomiting felt to be PSBOs, but all SBFTs have shown no evidence of PSBOs.  This last admission in February for N/V, she had a CT scan that questioned a PSBO secondary to a crohn's flare up.  GI was consulted for further management.  She was not felt to have an infectious process at that time, as she had no fever and no WBC.    She is being followed by Dr. Danise [name] who ordered a repeat CT scan last week.  Since that scan she has had left sided abdominal pain with nausea and vomiting.  Despite her nausea and vomiting, she has still been passing air and stool into her colostomy.  Her repeat scan showed this same area of concern for a fluid collection near her vaginal cuff that was on her last scan.  We have been asked to see her for further recommendations.  Objective: Vital signs in last 24 hours: Temp:  [98.5 F (36.9 C)-99.2 F (37.3 C)] 99.2 F (37.3 C) (03/10 1219) Pulse Rate:  [101-126] 126 (03/10 1219) Resp:  [16-18] 18 (03/10 1219) BP: (96-157)/(42-142) 157/142 mmHg (03/10 1219) SpO2:  [91 %-100 %] 99 % (03/10 1219) Weight:  [124 lb 9 oz (56.5 kg)] 124 lb 9 oz (56.5 kg) (03/10 1300) Last BM Date: 08/02/13  Intake/Output from previous day:   Intake/Output this shift: Total I/O In: 500 [I.V.:500] Out: -   PE: Abd: soft, tender on the left side of her abdomen, otherwise ND, ostomy with good output.  +BS Heart: regular, but mild tachy Lungs: CTAB  Lab Results:   Recent Labs  08/02/13 0305  WBC 10.5  HGB 12.4  HCT 37.3  PLT 271   BMET  Recent Labs  08/02/13 0305  NA 141  K 5.1  CL 100  CO2 26  GLUCOSE 101*  BUN 18  CREATININE 0.90  CALCIUM 9.3   PT/INR No results found for this basename: LABPROT, INR,  in the  last 72 hours CMP     Component Value Date/Time   NA 141 08/02/2013 0305   K 5.1 08/02/2013 0305   CL 100 08/02/2013 0305   CO2 26 08/02/2013 0305   GLUCOSE 101* 08/02/2013 0305   BUN 18 08/02/2013 0305   CREATININE 0.90 08/02/2013 0305   CREATININE 1.04 08/27/2012 1054   CALCIUM 9.3 08/02/2013 0305   CALCIUM 9.0 07/15/2011 0920   PROT 6.5 08/02/2013 0305   ALBUMIN 2.7* 08/02/2013 0305   AST 11 08/02/2013 0305   ALT 10 08/02/2013 0305   ALKPHOS 71 08/02/2013 0305   BILITOT 0.2* 08/02/2013 0305   GFRNONAA 67* 08/02/2013 0305   GFRAA 77* 08/02/2013 0305   Lipase     Component Value Date/Time   LIPASE 12 08/02/2013 0305       Studies/Results: Dg Abd Acute W/chest  08/02/2013   CLINICAL DATA:  Abdominal and chest pain, Crohn history.  EXAM: ACUTE ABDOMEN SERIES (ABDOMEN 2 VIEW & CHEST 1 VIEW)  COMPARISON:  07/28/2013 CT, 07/10/2013 abdominal series  FINDINGS: Lungs remain hypoaerated with large right apical bullae. No confluent airspace opacity. Cardiomediastinal contours within normal range.  No free intraperitoneal air. Relative paucity of bowel gas other than a dilated loop of small bowel up to 4.7  cm within the left lower quadrant. Right lower quadrant ileostomy. Evidence of prior embolization perhaps along the gastroduodenal artery. No acute osseous finding.  IMPRESSION: Relative paucity of bowel gas other than a dilated bowel loop in the left lower quadrant up to 4.7 cm. Small-bowel obstruction not excluded.   Electronically Signed   By: Jearld Lesch M.D.   On: 08/02/2013 06:12    Anti-infectives: Anti-infectives   Start     Dose/Rate Route Frequency Ordered Stop   08/02/13 1300  hydroxychloroquine (PLAQUENIL) tablet 200 mg     200 mg Oral 2 times daily 08/02/13 1201         Assessment/Plan  1. Chronic N/V 2. Abdominal fluid collection 3. Crohn's disease 4. End stage COPD, O2 dependent  Plan: 1. This is a complex patient and situation.  She is currently AF with a normal WBC.   She is on prednisone.  Unclear whether this collection in the pelvis is infectious or not.  We will need to review this with Dr. Magnus [name] and possibly radiology to determine what type of treatment she may need.   2. Her pain acutely started after drinking her CT scan contrast.  This fluid collection has been present for multiple weeks now.  I doubt this is the source for her "new" pain and symptoms.  She typically does not tolerate big boluses of fluid or food and wonder if this big bolus of contrast was difficult for her to tolerate.  3. Recommend a GI consult for continued crohn's management. 4. Would like to avoid surgery at all costs if able as this patient is very high risk.  LOS: 0 days    [name] E 08/02/2013, 1:05 PM Pager: [phone]

## 2013-08-02 NOTE — ED Notes (Signed)
Patient presents via [facility] with c/o abd pain that comes and goes.  History of Chrone's disease

## 2013-08-02 NOTE — Progress Notes (Signed)
I have seen and examined the patient and agree with the assessment and plans. I doubt that the collection can be drained by IR.  Will try IV antibiotics for now.  May need exploratory laparotomy to drain the collection vs transvaginal drainage.  Jd Mccaster A. Magnus Ivan  MD, FACS

## 2013-08-02 NOTE — Progress Notes (Signed)
INITIAL NUTRITION ASSESSMENT  DOCUMENTATION CODES Per approved criteria  -Severe malnutrition in the context of chronic illness   INTERVENTION: Diet advancement per MD's discretion. Recommend advancement to "Soft Diet" once permitted. Continue Pro-stat once daily when diet advanced, provides 15 grams of protein and 100 kcal. RD to continue to monitor nutrition care plan  NUTRITION DIAGNOSIS: Inadequate oral intake related to GI distress as evidenced by pt's report.  Goal: Diet advancement as tolerated Pt to meet >/= 90% of their estimated nutrition needs   Monitor:  Diet advancement, PO intake, Weight trends, labs  Reason for Assessment: Malnutrition Screening Tool + MD Consult for Nutrition Assessment  63 y.o. female  Admitting Dx: Pelvic abscess in female  ASSESSMENT: 63 year old female PMH HTN, COPD, DM, RA, deperession, anxiety, Crohn's disease s/p total abdominal colectomy and ileostomy in 2007, recent SBO s/p exploratory laparotomy, lysis of adhesions - this resolved her small bowel obstruction. Presented with diffuse abdominal pain, n/v and fevers x 6-7 days. Work-up reveals pelvic abscess with sepsis.  Pt with fluid collection near her vaginal cuff, plan to review case with surgery and radiology to determine plan for treatment. Remains NPO at this time.  Patient reports that she has been unable to keep anything down for more than 2 days. She states that over the past 7 days, she has gone at least 3 days without eating or drinking anything. She has a limited diet - eats softer foods such as chopped cube steak, well cooked fish, cooked vegetables, etc. Patient reports that the only supplement she can tolerate is Prostat and she only likes the cherry flavor.  Pt with hx of requiring TPN.  Nutrition Focused Physical Exam:   Subcutaneous Fat:  Orbital Region: WNL  Upper Arm Region: severe wasting  Thoracic and Lumbar Region: severe wasting   Muscle:  Temple Region:  moderate wasting  Clavicle Bone Region: severe wasting  Clavicle and Acromion Bone Region: mild wasting  Scapular Bone Region: n/a Dorsal Hand: n/a  Patellar Region: severe wasting  Anterior Thigh Region: severe wasting  Posterior Calf Region: severe wasting   Edema: not present  Pt meets criteria for severe MALNUTRITION in the context of chronic illness as evidenced by severe fat and muscle mass loss.  Labs reviewed; most recent HgbA1c is 5.1 in February 2015 Meds include: calcium-vitamin D, D5 with 1/2 NS at 75 ml/hr, 30 ml Prostat daily, novolog - sensitive scale, protonoix  Height: Ht Readings from Last 1 Encounters:  08/02/13 5' 1.02" (1.55 m)    Weight: Wt Readings from Last 1 Encounters:  08/02/13 124 lb 9 oz (56.5 kg)  Patient reports that this weight is correct and her actual weight is approximately 104-106 lb - unable to verify this  Ideal Body Weight: 105 lbs  % Ideal Body Weight: 118%  Wt Readings from Last 10 Encounters:  08/02/13 124 lb 9 oz (56.5 kg)  07/15/13 124 lb 9 oz (56.5 kg)  07/06/13 113 lb (51.256 kg)  06/23/13 124 lb (56.246 kg)  06/15/13 117 lb (53.071 kg)  06/03/13 115 lb (52.164 kg)  05/11/13 95 lb 3.8 oz (43.2 kg)  04/26/13 110 lb 4.8 oz (50.032 kg)  04/19/13 109 lb 2 oz (49.5 kg)  04/11/13 109 lb (49.442 kg)    Usual Body Weight: 126 lbs  % Usual Body Weight: 98%  BMI:  Body mass index is 23.52 kg/(m^2).  Normal weight  Estimated Nutritional Needs: Kcal: 1500-1700 Protein: 70-80 grams Fluid: 1.5-1.7 L/day  Skin: WDL  Diet Order: NPO  EDUCATION NEEDS: -No education needs identified at this time   Intake/Output Summary (Last 24 hours) at 08/02/13 1408 Last data filed at 08/02/13 0702  Gross per 24 hour  Intake    500 ml  Output      0 ml  Net    500 ml    Last BM: 3/10; ileostomy  Labs:   Recent Labs Lab 08/02/13 0305 08/02/13 1225  NA 141  --   K 5.1  --   CL 100  --   CO2 26  --   BUN 18  --   CREATININE  0.90 0.99  CALCIUM 9.3  --   GLUCOSE 101*  --     CBG (last 3)   Recent Labs  08/02/13 1158  GLUCAP 102*   Lab Results  Component Value Date   HGBA1C 5.1 07/10/2013    Scheduled Meds: . arformoterol  15 mcg Nebulization BID  . [START ON 08/03/2013] aspirin EC  81 mg Oral Q M,W,F  . atorvastatin  40 mg Oral q1800  . budesonide  0.25 mg Nebulization BID  . calcium-vitamin D  1 tablet Oral BID  . citalopram  20 mg Oral Daily  . diltiazem  180 mg Oral BID  . famotidine  20 mg Oral BID  . feeding supplement (PRO-STAT SUGAR FREE 64)  30 mL Oral Daily  . heparin  5,000 Units Subcutaneous 3 times per day  . hydroxychloroquine  200 mg Oral BID  . insulin aspart  0-9 Units Subcutaneous 6 times per day  . LORazepam  0.5 mg Oral BID  . pantoprazole  40 mg Oral Daily  . piperacillin-tazobactam (ZOSYN)  IV  3.375 g Intravenous Once  . piperacillin-tazobactam (ZOSYN)  IV  3.375 g Intravenous Q8H  . [START ON 08/03/2013] predniSONE  40 mg Oral Q breakfast  . tiotropium  18 mcg Inhalation Daily    Continuous Infusions: . dextrose 5 % and 0.45% NaCl 75 mL/hr at 08/02/13 1107    Past Medical History  Diagnosis Date  . Other diseases of vocal cords   . Osteoporosis, unspecified   . Other and unspecified hyperlipidemia   . Esophageal reflux   . Chronic airway obstruction, not elsewhere classified   . Regional enteritis of unspecified site   . 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 Crohn's disease     Ileostomy  . Pelvic adhesions   . Anxiety   . Depression   . Hypertension   . Emphysema   . Hematuria   . Severe cervical dysplasia age 59    cone biopsy with neg paps since  . Pneumonia     "3-4 times; necrotizing once" (04/19/2013)  . Asthmatic bronchitis , chronic   . Exertional shortness of breath   . On home oxygen therapy     "24/7; 2.5L @ rest; 3L when I'm doing my chores" (04/19/2013)  . Type II diabetes mellitus   . Anemia   . History of blood  transfusion     "I've had 3 or 4" (04/19/2013)  . Rheumatoid arthritis(714.0) 03/2012    [facility]; "mild in my hands" (04/19/2013)  . Small bowel obstruction due to adhesions 2014    [facility]worth  . Pelvic abscess in female 2014    Past Surgical History  Procedure Laterality Date  . Ileostomy  2007  . Ear cyst excision Bilateral 1970's    "lobes"   . Cervical cone biopsy  1978  . Colon surgery  2007    subtotal colectomy with ileostomy  . Esophagogastroduodenoscopy (egd) with esophageal dilation      "3 times, I think" (04/19/2013)  . Oophorectomy      LSO  . Tracheostomy  2010  . Hammer toe surgery Bilateral 1980's  . Balloon dilation  02/10/2012    Procedure: BALLOON DILATION;  Surgeon: Charolett Bumpers, MD;  Location: [facility] ENDOSCOPY;  Service: Endoscopy;  Laterality: N/A;  . Colposcopy    . Flexible sigmoidoscopy  03/26/2012    Procedure: FLEXIBLE SIGMOIDOSCOPY;  Surgeon: Charolett Bumpers, MD;  Location: [facility];  Service: Endoscopy;  Laterality: N/A;  . Cardiac catheterization  07/08/2007    normal  . Nm myocar perf wall motion  09/21/2006    no ischemia  . Ventral hernia repair N/A 03/16/2013    Procedure: REPAIR OF INCISIONAL AND PARASTOMAL HERNIAS;  Surgeon: Mariella Saa, MD;  Location: [facility] OR;  Service: General;  Laterality: N/A;  . Laparotomy N/A 03/16/2013    Procedure: EXPLORATORY LAPAROTOMY;  Surgeon: Mariella Saa, MD;  Location: [facility] OR;  Service: General;  Laterality: N/A;  . Lysis of adhesion N/A 03/16/2013    Procedure: LYSIS OF ADHESIONS FOR SMALL BOWEL OBSTRUCTION;  Surgeon: Mariella Saa, MD;  Location: [facility] OR;  Service: General;  Laterality: N/A;  . Insertion of mesh N/A 03/16/2013    Procedure: INSERTION OF BIOLOGIC MESH;  Surgeon: Mariella Saa, MD;  Location: [facility] OR;  Service: General;  Laterality: N/A;  . Tee without cardioversion N/A 03/21/2013    Procedure: TRANSESOPHAGEAL ECHOCARDIOGRAM (TEE);  Surgeon: Vesta Mixer, MD;   Location: [facility] ENDOSCOPY;  Service: Cardiovascular;  Laterality: N/A;  . Vaginal hysterectomy  1993    [facility]-LSO  . Tracheostomy closure  2010    Jarold Motto MS, RD, LDN Inpatient Registered Dietitian Pager: [phone] After-hours pager: [phone]

## 2013-08-02 NOTE — ED Notes (Signed)
Pt did not want anything to eat. Given ginger ale. Asking for pain medicine so her pain doesn't come back. Made aware, lets address this with hospitalist when he comes to bedside.

## 2013-08-02 NOTE — ED Notes (Signed)
Reported pain to Dr. Hyacinth Meeker and dilaudid was not effective.  MD orders 100mg  of Fentanyl.

## 2013-08-02 NOTE — ED Notes (Signed)
Patient reports she "feels like she has the squirts".  She has a colostomy.

## 2013-08-02 NOTE — ED Notes (Signed)
Pt provided ginger ale.  

## 2013-08-02 NOTE — Progress Notes (Signed)
ANTIBIOTIC CONSULT NOTE - INITIAL  Pharmacy Consult for Zosyn Indication: Intra-abdominal infection   Allergies  Allergen Reactions  . Esomeprazole Magnesium Other (See Comments)    NEXIUM - reaction > aggravated pt's Crohn's disease  . Shellfish Allergy Shortness Of Breath and Nausea And Vomiting  . Surgical Lubricant Other (See Comments)    Burns skin   . Other Nausea And Vomiting    Beans, Dander/Dust, Peas, Mushrooms  . Peanut-Containing Drug Products Nausea And Vomiting  . Sulfate     N/V  . Levaquin [Levofloxacin]     Patient Measurements: Height: 5' 1.02" (155 cm) Weight: 124 lb 9 oz (56.5 kg) (Copied from 07/15/13) IBW/kg (Calculated) : 47.86 Adjusted Body Weight: n/a  Vital Signs: Temp: 99.2 F (37.3 C) (03/10 1219) Temp src: Oral (03/10 1219) BP: 157/142 mmHg (03/10 1219) Pulse Rate: 126 (03/10 1219) Intake/Output from previous day:   Intake/Output from this shift: Total I/O In: 500 [I.V.:500] Out: -   Labs:  Recent Labs  08/02/13 0305 08/02/13 1225  WBC 10.5 9.1  HGB 12.4 11.9*  PLT 271 284  CREATININE 0.90  --    Estimated Creatinine Clearance: 48.4 ml/min (by C-G formula based on Cr of 0.9). No results found for this basename: VANCOTROUGH, VANCOPEAK, VANCORANDOM, GENTTROUGH, GENTPEAK, GENTRANDOM, TOBRATROUGH, TOBRAPEAK, TOBRARND, AMIKACINPEAK, AMIKACINTROU, AMIKACIN,  in the last 72 hours   Microbiology: No results found for this or any previous visit (from the past 720 hour(s)).  Medical History: Past Medical History  Diagnosis Date  . Other diseases of vocal cords   . Osteoporosis, unspecified   . Other and unspecified hyperlipidemia   . Esophageal reflux   . Chronic airway obstruction, not elsewhere classified   . Regional enteritis of unspecified site   . 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 Crohn's disease     Ileostomy  . Pelvic adhesions   . Anxiety   . Depression   . Hypertension   . Emphysema   .  Hematuria   . Severe cervical dysplasia age 57    cone biopsy with neg paps since  . Pneumonia     "3-4 times; necrotizing once" (04/19/2013)  . Asthmatic bronchitis , chronic   . Exertional shortness of breath   . On home oxygen therapy     "24/7; 2.5L @ rest; 3L when I'm doing my chores" (04/19/2013)  . Type II diabetes mellitus   . Anemia   . History of blood transfusion     "I've had 3 or 4" (04/19/2013)  . Rheumatoid arthritis(714.0) 03/2012    [place]ckman; "mild in my hands" (04/19/2013)  . Small bowel obstruction due to adhesions 2014    [facility]worth  . Pelvic abscess in female 2014    Medications:  Prescriptions prior to admission  Medication Sig Dispense Refill  . Amino Acids-Protein Hydrolys (FEEDING SUPPLEMENT, PRO-STAT SUGAR FREE 64,) LIQD Take 30 mLs by mouth daily.  900 mL  0  . arformoterol (BROVANA) 15 MCG/2ML NEBU Take 15 mcg by nebulization 2 (two) times daily.      [name] aspirin 81 MG tablet Take 81 mg by mouth every Monday, Wednesday, and Friday.       . budesonide (PULMICORT) 0.25 MG/2ML nebulizer solution Take 0.25 mg by nebulization 2 (two) times daily.      . calcium-vitamin D (OSCAL WITH D) 500-200 MG-UNIT per tablet Take 1 tablet by mouth 2 (two) times daily.      . Cholecalciferol (VITAMIN D) 1000 UNITS capsule Take  1,000 Units by mouth daily.       . citalopram (CELEXA) 20 MG tablet Take 20 mg by mouth daily.       [name] diltiazem (TIAZAC) 180 MG 24 hr capsule Take 180 mg by mouth 2 (two) times daily.      . famotidine (PEPCID) 20 MG tablet Take 20 mg by mouth 2 (two) times daily.      . fluticasone (FLONASE) 50 MCG/ACT nasal spray Place 2 sprays into the nose daily as needed.       [name] guaiFENesin (MUCINEX) 600 MG 12 hr tablet Take 1,200 mg by mouth 2 (two) times daily.      [name] HYDROcodone-acetaminophen (NORCO/VICODIN) 5-325 MG per tablet Take 1-2 tablets by mouth every 6 (six) hours as needed for moderate pain or severe pain.  15 tablet  0  . hydroxychloroquine  (PLAQUENIL) 200 MG tablet Take 200 mg by mouth Twice daily.       . IRON PO Take 1 tablet by mouth every Monday, Wednesday, and Friday.       . Liraglutide (VICTOZA) 18 MG/3ML SOLN Inject 1.8 mg into the skin daily. 1.8 units once daily      . LORazepam (ATIVAN) 0.5 MG tablet TAKE 1 TABLET BY MOUTH EVERY 8 HOURS AS NEEDED  60 tablet  0  . omeprazole (PRILOSEC) 40 MG capsule Take 40 mg by mouth 2 (two) times daily.       . ondansetron (ZOFRAN) 4 MG tablet Take 1 tablet (4 mg total) by mouth every 6 (six) hours as needed for nausea.  20 tablet  0  . predniSONE (DELTASONE) 20 MG tablet Take 2 tablets (40 mg total) by mouth daily with breakfast.  60 tablet  0  . PROAIR HFA 108 (90 BASE) MCG/ACT inhaler Inhale 2 puffs into the lungs every 6 (six) hours as needed for wheezing or shortness of breath.       [name] Respiratory Therapy Supplies (FLUTTER) DEVI Inhale into the lungs. 2-4 times daily      . rosuvastatin (CRESTOR) 5 MG tablet Take 5 mg by mouth every Monday, Wednesday, and Friday.       . sulfamethoxazole-trimethoprim (BACTRIM DS) 800-160 MG per tablet Take 1 tablet by mouth 2 (two) times daily.      [name] tiotropium (SPIRIVA) 18 MCG inhalation capsule Place 1 capsule (18 mcg total) into inhaler and inhale daily.  30 capsule  5  . traMADol (ULTRAM) 50 MG tablet Take 1 tablet (50 mg total) by mouth every 6 (six) hours as needed for moderate pain or severe pain.  60 tablet  0   Assessment: 63 YOF s/p colectomy with an ileostomy complicated by multiple intra-abdominal abscesses for several months. CC of N/V x 6-7 days PTA with fever and chills. Abdominal CT shows growing pelvic abscess. WBC wnl, CrCl ~ 50 mL/min, all other labs as above. No cultures.    Goal of Therapy:  Eradication of infection  Plan:  1) Zosyn 3.375 gm IV Q 8 hours 2) Monitor CBC, renal fx and patient's clinical progress.  3) F/u surgery and GI recommendations    Vinnie Level, PharmD.  Clinical Pharmacist Pager  [phone]

## 2013-08-02 NOTE — ED Provider Notes (Signed)
CSN: [id number]     Arrival date & time 08/02/13  0224 History   First MD Initiated Contact with Patient 08/02/13 0401     Chief Complaint  Patient presents with  . 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 She presents with a complaint of ongoing abdominal pain. This abdominal pain has been present since October of 2014, she goes through bad days and bad days but has had multiple admissions to the hospital with possible bowel obstructions, intra-abdominal abscesses. At this time the patient has severe diffuse abdominal pain, nothing seems to make it better or worse, and it is associated with fevers chills and multiple episodes of nausea and vomiting. Her ostomy has been putting out liquid stool which it usually does. She had a total colectomy in 2010 according to the patient.  The patient was seen yesterday at the office and given a gram of Rocephin intramuscular as well as Toradol intramuscular for a perceived urinary tract infection, Bactrim was prescribed, the patient has filled his medication.  Patient is a 63 y.o. female presenting with abdominal pain. The history is provided by the patient.  Abdominal Pain   Past Medical History  Diagnosis Date  . Other diseases of vocal cords   . Osteoporosis, unspecified   . Other and unspecified hyperlipidemia   . Esophageal reflux   . Chronic airway obstruction, not elsewhere classified   . Regional enteritis of unspecified site   . 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 Crohn's disease     Ileostomy  . Pelvic adhesions   . Anxiety   . Depression   . Hypertension   . Emphysema   . Hematuria   . Severe cervical dysplasia age 63    cone biopsy with neg paps since  . Pneumonia     "3-4  times; necrotizing once" (04/19/2013)  . Asthmatic bronchitis , chronic   . Exertional shortness of breath   . On home oxygen therapy     "24/7; 2.5L @ rest; 3L when I'm doing my chores" (04/19/2013)  . Type II diabetes mellitus   . Anemia   . History of blood transfusion     "I've had 3 or 4" (04/19/2013)  . Rheumatoid arthritis(714.0) 03/2012    [place]; "mild in my hands" (04/19/2013)  . Small bowel obstruction due to adhesions 2014    [place]  . Pelvic abscess in female 2014   Past Surgical History  Procedure Laterality Date  . Ileostomy  2007  . Ear cyst excision Bilateral 1970's    "lobes"   . Cervical cone biopsy  1978  . Colon surgery  2007    subtotal colectomy with ileostomy  . Esophagogastroduodenoscopy (egd) with esophageal dilation      "3 times, I think" (04/19/2013)  . Oophorectomy      LSO  . Tracheostomy  2010  . Hammer toe surgery Bilateral 1980's  . Balloon dilation  02/10/2012    Procedure: BALLOON DILATION;  Surgeon: Charolett BumpersMartin K Johnson, MD;  Location: [facility];  Service: Endoscopy;  Laterality: N/A;  . Colposcopy    . Flexible sigmoidoscopy  03/26/2012    Procedure: FLEXIBLE SIGMOIDOSCOPY;  Surgeon: Charolett BumpersMartin K Johnson, MD;  Location: [facility];  Service: Endoscopy;  Laterality: N/A;  . Cardiac catheterization  07/08/2007    normal  . Nm myocar perf wall  motion  09/21/2006    no ischemia  . Ventral hernia repair N/A 03/16/2013    Procedure: REPAIR OF INCISIONAL AND PARASTOMAL HERNIAS;  Surgeon: Mariella Saa, MD;  Location: [facility] OR;  Service: General;  Laterality: N/A;  . Laparotomy N/A 03/16/2013    Procedure: EXPLORATORY LAPAROTOMY;  Surgeon: Mariella Saa, MD;  Location: [facility] OR;  Service: General;  Laterality: N/A;  . Lysis of adhesion N/A 03/16/2013    Procedure: LYSIS OF ADHESIONS FOR SMALL BOWEL OBSTRUCTION;  Surgeon: Mariella Saa, MD;  Location: [facility] OR;  Service: General;  Laterality: N/A;  . Insertion of mesh N/A 03/16/2013     Procedure: INSERTION OF BIOLOGIC MESH;  Surgeon: Mariella Saa, MD;  Location: [facility] OR;  Service: General;  Laterality: N/A;  . Tee without cardioversion N/A 03/21/2013    Procedure: TRANSESOPHAGEAL ECHOCARDIOGRAM (TEE);  Surgeon: Vesta Mixer, MD;  Location: [facility] ENDOSCOPY;  Service: Cardiovascular;  Laterality: N/A;  . Vaginal hysterectomy  1993    [facility]-LSO  . Tracheostomy closure  2010   Family History  Problem Relation Age of Onset  . Osteoporosis Mother   . Kidney disease Mother   . Hypertension Sister   . Stroke Sister   . Diabetes Sister    History  Substance Use Topics  . Smoking status: Former Smoker -- 1.00 packs/day for 32 years    Types: Cigarettes    Quit date: 05/26/2004  . Smokeless tobacco: Never Used  . Alcohol Use: 0.0 oz/week     Comment: 04/19/2013 "have a drink a couple times/yr"   OB History   Grav Para Term Preterm Abortions TAB SAB Ect Mult Living   3 0   3     0     Review of Systems  Gastrointestinal: Positive for abdominal pain.  All other systems reviewed and are negative.      Allergies  Esomeprazole magnesium; Shellfish allergy; Surgical lubricant; Other; Peanut-containing drug products; Sulfate; and Levaquin  Home Medications   Current Outpatient Rx  Name  Route  Sig  Dispense  Refill  . Amino Acids-Protein Hydrolys (FEEDING SUPPLEMENT, PRO-STAT SUGAR FREE 64,) LIQD   Oral   Take 30 mLs by mouth daily.   900 mL   0   . arformoterol (BROVANA) 15 MCG/2ML NEBU   Nebulization   Take 15 mcg by nebulization 2 (two) times daily.         [name] aspirin 81 MG tablet   Oral   Take 81 mg by mouth every Monday, Wednesday, and Friday.          . budesonide (PULMICORT) 0.25 MG/2ML nebulizer solution   Nebulization   Take 0.25 mg by nebulization 2 (two) times daily.         . calcium-vitamin D (OSCAL WITH D) 500-200 MG-UNIT per tablet   Oral   Take 1 tablet by mouth 2 (two) times daily.         . Cholecalciferol (VITAMIN D)  1000 UNITS capsule   Oral   Take 1,000 Units by mouth daily.          . citalopram (CELEXA) 20 MG tablet   Oral   Take 20 mg by mouth daily.          [name] diltiazem (TIAZAC) 180 MG 24 hr capsule   Oral   Take 180 mg by mouth 2 (two) times daily.         . famotidine (PEPCID) 20 MG tablet   Oral  Take 20 mg by mouth 2 (two) times daily.         . fluticasone (FLONASE) 50 MCG/ACT nasal spray   Nasal   Place 2 sprays into the nose daily as needed.          [name] guaiFENesin (MUCINEX) 600 MG 12 hr tablet   Oral   Take 1,200 mg by mouth 2 (two) times daily.         [name] HYDROcodone-acetaminophen (NORCO/VICODIN) 5-325 MG per tablet   Oral   Take 1-2 tablets by mouth every 6 (six) hours as needed for moderate pain or severe pain.   15 tablet   0   . hydroxychloroquine (PLAQUENIL) 200 MG tablet   Oral   Take 200 mg by mouth Twice daily.          . IRON PO   Oral   Take 1 tablet by mouth every Monday, Wednesday, and Friday.          . Liraglutide (VICTOZA) 18 MG/3ML SOLN   Subcutaneous   Inject 1.8 mg into the skin daily. 1.8 units once daily         . LORazepam (ATIVAN) 0.5 MG tablet      TAKE 1 TABLET BY MOUTH EVERY 8 HOURS AS NEEDED   60 tablet   0   . omeprazole (PRILOSEC) 40 MG capsule   Oral   Take 40 mg by mouth 2 (two) times daily.          . ondansetron (ZOFRAN) 4 MG tablet   Oral   Take 1 tablet (4 mg total) by mouth every 6 (six) hours as needed for nausea.   20 tablet   0   . predniSONE (DELTASONE) 20 MG tablet   Oral   Take 2 tablets (40 mg total) by mouth daily with breakfast.   60 tablet   0   . PROAIR HFA 108 (90 BASE) MCG/ACT inhaler   Inhalation   Inhale 2 puffs into the lungs every 6 (six) hours as needed for wheezing or shortness of breath.          [name] Respiratory Therapy Supplies (FLUTTER) DEVI   Inhalation   Inhale into the lungs. 2-4 times daily         . rosuvastatin (CRESTOR) 5 MG tablet   Oral   Take 5 mg by  mouth every Monday, Wednesday, and Friday.          . sulfamethoxazole-trimethoprim (BACTRIM DS) 800-160 MG per tablet   Oral   Take 1 tablet by mouth 2 (two) times daily.         [name] tiotropium (SPIRIVA) 18 MCG inhalation capsule   Inhalation   Place 1 capsule (18 mcg total) into inhaler and inhale daily.   30 capsule   5   . traMADol (ULTRAM) 50 MG tablet   Oral   Take 1 tablet (50 mg total) by mouth every 6 (six) hours as needed for moderate pain or severe pain.   60 tablet   0    BP 123/82  Pulse 109  Temp(Src) 98.9 F (37.2 C) (Oral)  Resp 18  SpO2 98% Physical Exam  Nursing note and vitals reviewed. Constitutional: She appears distressed.  HENT:  Head: Normocephalic and atraumatic.  Mouth/Throat: No oropharyngeal exudate.  Mucous membranes appear dry  Eyes: Conjunctivae and EOM are normal. Pupils are equal, round, and reactive to light. Right eye exhibits no discharge. Left eye exhibits no discharge. No scleral icterus.  Neck: Normal range of motion. Neck supple. No JVD present. No thyromegaly present.  Cardiovascular: Regular rhythm, normal heart sounds and intact distal pulses.  Exam reveals no gallop and no friction rub.   No murmur heard. Tachycardic  Pulmonary/Chest: Effort normal and breath sounds normal. No respiratory distress. She has no wheezes. She has no rales.  Abdominal: Soft. Bowel sounds are normal. She exhibits no distension and no mass. There is tenderness ( Severe diffuse abdominal tenderness, mild guarding, not peritoneal at this time).  Musculoskeletal: Normal range of motion. She exhibits no edema and no tenderness.  Lymphadenopathy:    She has no cervical adenopathy.  Neurological: She is alert. Coordination normal.  Skin: Skin is warm and dry. No rash noted. No erythema.  Psychiatric: She has a normal mood and affect. Her behavior is normal.    ED Course  Procedures (including critical care time) Labs Review Labs Reviewed  CBC WITH  DIFFERENTIAL - Abnormal; Notable for the following:    Neutrophils Relative % 82 (*)    Neutro Abs 8.6 (*)    Lymphocytes Relative 10 (*)    All other components within normal limits  COMPREHENSIVE METABOLIC PANEL - Abnormal; Notable for the following:    Glucose, Bld 101 (*)    Albumin 2.7 (*)    Total Bilirubin 0.2 (*)    GFR calc non Af Amer 67 (*)    GFR calc Af Amer 77 (*)    All other components within normal limits  LIPASE, BLOOD  URINALYSIS, ROUTINE W REFLEX MICROSCOPIC   Imaging Review Dg Abd Acute W/chest  08/02/2013   CLINICAL DATA:  Abdominal and chest pain, Crohn history.  EXAM: ACUTE ABDOMEN SERIES (ABDOMEN 2 VIEW & CHEST 1 VIEW)  COMPARISON:  07/28/2013 CT, 07/10/2013 abdominal series  FINDINGS: Lungs remain hypoaerated with large right apical bullae. No confluent airspace opacity. Cardiomediastinal contours within normal range.  No free intraperitoneal air. Relative paucity of bowel gas other than a dilated loop of small bowel up to 4.7 cm within the left lower quadrant. Right lower quadrant ileostomy. Evidence of prior embolization perhaps along the gastroduodenal artery. No acute osseous finding.  IMPRESSION: Relative paucity of bowel gas other than a dilated bowel loop in the left lower quadrant up to 4.7 cm. Small-bowel obstruction not excluded.   Electronically Signed   By: Jearld Lesch M.D.   On: 08/02/2013 06:12     EKG Interpretation None      MDM   Final diagnoses:  Abdominal pain  Nausea and vomiting    The patient appears extremely uncomfortable, she has vital signs consistent with a tachycardia, labs that showed no leukocytosis, low albumin consistent with her tolerate the patient states, she will need an x-ray to rule out perforation, anticipate admission to the hospital for her severe symptoms. See medications below  6:40 AM The patient has improved after being given pain medication but has ongoing pain. Her nausea has improved. I have discussed her  care with the hospitalist Dr. Allena Katz who will have the daytime hospitalist see her after 7:00, the patient has been informed that she will be evaluated but not necessarily admitted   Meds given in ED:  Medications  ondansetron (ZOFRAN) 4 MG/2ML injection (not administered)  fentaNYL (SUBLIMAZE) injection 50 mcg (50 mcg Intravenous Given 08/02/13 0316)  ondansetron (ZOFRAN) injection 4 mg (4 mg Intravenous Given 08/02/13 0315)  HYDROmorphone (DILAUDID) injection 1 mg (1 mg Intravenous Given 08/02/13 0422)  sodium chloride 0.9 % bolus  500 mL (500 mLs Intravenous New Bag/Given 08/02/13 0423)  ondansetron (ZOFRAN) injection 4 mg (4 mg Intravenous Given 08/02/13 0423)  fentaNYL (SUBLIMAZE) injection 100 mcg (100 mcg Intravenous Given 08/02/13 0609)    New Prescriptions   No medications on file        Vida Roller, MD 08/02/13 [phone]

## 2013-08-02 NOTE — Progress Notes (Signed)
Received report from ED RN Maralyn Sago.  [name], [name]

## 2013-08-03 DIAGNOSIS — R109 Unspecified abdominal pain: Secondary | ICD-10-CM

## 2013-08-03 DIAGNOSIS — K56609 Unspecified intestinal obstruction, unspecified as to partial versus complete obstruction: Secondary | ICD-10-CM

## 2013-08-03 LAB — COMPREHENSIVE METABOLIC PANEL
ALBUMIN: 2 g/dL — AB (ref 3.5–5.2)
ALT: 7 U/L (ref 0–35)
AST: 9 U/L (ref 0–37)
Alkaline Phosphatase: 56 U/L (ref 39–117)
BILIRUBIN TOTAL: 0.3 mg/dL (ref 0.3–1.2)
BUN: 13 mg/dL (ref 6–23)
CHLORIDE: 101 meq/L (ref 96–112)
CO2: 27 mEq/L (ref 19–32)
CREATININE: 1.15 mg/dL — AB (ref 0.50–1.10)
Calcium: 8.5 mg/dL (ref 8.4–10.5)
GFR calc Af Amer: 57 mL/min — ABNORMAL LOW (ref >90)
GFR calc non Af Amer: 50 mL/min — ABNORMAL LOW (ref >90)
Glucose, Bld: 79 mg/dL (ref 70–99)
Potassium: 3.8 mEq/L (ref 3.7–5.3)
Sodium: 137 mEq/L (ref 137–147)
Total Protein: 4.9 g/dL — ABNORMAL LOW (ref 6.0–8.3)

## 2013-08-03 LAB — GLUCOSE, CAPILLARY
Glucose-Capillary: 147 mg/dL — ABNORMAL HIGH (ref 70–99)
Glucose-Capillary: 154 mg/dL — ABNORMAL HIGH (ref 70–99)
Glucose-Capillary: 166 mg/dL — ABNORMAL HIGH (ref 70–99)
Glucose-Capillary: 73 mg/dL (ref 70–99)
Glucose-Capillary: 82 mg/dL (ref 70–99)
Glucose-Capillary: 86 mg/dL (ref 70–99)

## 2013-08-03 LAB — CBC
HCT: 28.3 % — ABNORMAL LOW (ref 36.0–46.0)
Hemoglobin: 9.2 g/dL — ABNORMAL LOW (ref 12.0–15.0)
MCH: 27.8 pg (ref 26.0–34.0)
MCHC: 32.5 g/dL (ref 30.0–36.0)
MCV: 85.5 fL (ref 78.0–100.0)
Platelets: 215 10*3/uL (ref 150–400)
RBC: 3.31 MIL/uL — AB (ref 3.87–5.11)
RDW: 13.9 % (ref 11.5–15.5)
WBC: 7.4 10*3/uL (ref 4.0–10.5)

## 2013-08-03 NOTE — Progress Notes (Signed)
Utilization Review Completed.[name] T3/03/2014  

## 2013-08-03 NOTE — Progress Notes (Signed)
  Subjective: Still having intermittent abdominal pain  Objective: Vital signs in last 24 hours: Temp:  [98.5 F (36.9 C)-99.8 F (37.7 C)] 99.8 F (37.7 C) (03/11 0734) Pulse Rate:  [66-126] 99 (03/11 0734) Resp:  [18] 18 (03/11 0734) BP: (88-157)/(42-142) 110/59 mmHg (03/11 0734) SpO2:  [91 %-100 %] 100 % (03/11 0734) Weight:  [124 lb 9 oz (56.5 kg)] 124 lb 9 oz (56.5 kg) (03/10 1300) Last BM Date: 08/02/13  Intake/Output from previous day: 03/10 0701 - 03/11 0700 In: 2035 [I.V.:1985; IV Piggyback:50] Out: -  Intake/Output this shift:    Abdomen soft, non distended but tender with guarding across her lower abdomen  Lab Results:   Recent Labs  08/02/13 0305 08/02/13 1225  WBC 10.5 9.1  HGB 12.4 11.9*  HCT 37.3 36.6  PLT 271 284   BMET  Recent Labs  08/02/13 1859 08/03/13 0703  NA 138 137  K 3.7 3.8  CL 99 101  CO2 26 27  GLUCOSE 86 79  BUN 16 13  CREATININE 1.03 1.15*  CALCIUM 8.7 8.5   PT/INR No results found for this basename: LABPROT, INR,  in the last 72 hours ABG No results found for this basename: PHART, PCO2, PO2, HCO3,  in the last 72 hours  Studies/Results: Dg Abd Acute W/chest  08/02/2013   CLINICAL DATA:  Abdominal and chest pain, Crohn history.  EXAM: ACUTE ABDOMEN SERIES (ABDOMEN 2 VIEW & CHEST 1 VIEW)  COMPARISON:  07/28/2013 CT, 07/10/2013 abdominal series  FINDINGS: Lungs remain hypoaerated with large right apical bullae. No confluent airspace opacity. Cardiomediastinal contours within normal range.  No free intraperitoneal air. Relative paucity of bowel gas other than a dilated loop of small bowel up to 4.7 cm within the left lower quadrant. Right lower quadrant ileostomy. Evidence of prior embolization perhaps along the gastroduodenal artery. No acute osseous finding.  IMPRESSION: Relative paucity of bowel gas other than a dilated bowel loop in the left lower quadrant up to 4.7 cm. Small-bowel obstruction not excluded.   Electronically  Signed   By: Jearld Lesch M.D.   On: 08/02/2013 06:12    Anti-infectives: Anti-infectives   Start     Dose/Rate Route Frequency Ordered Stop   08/02/13 2200  piperacillin-tazobactam (ZOSYN) IVPB 3.375 g     3.375 g 12.5 mL/hr over 240 Minutes Intravenous Every 8 hours 08/02/13 1305     08/02/13 1330  piperacillin-tazobactam (ZOSYN) IVPB 3.375 g     3.375 g 12.5 mL/hr over 240 Minutes Intravenous  Once 08/02/13 1305 08/02/13 1822   08/02/13 1300  hydroxychloroquine (PLAQUENIL) tablet 200 mg     200 mg Oral 2 times daily 08/02/13 1201        Assessment/Plan: s/p * No surgery found *  Abdominal pain with chronic pelvic fluid collection and possible partial SBO  She is a fairly poor surgical candidate.  The collection is multiseptated and can't be drained by IR.  Would favor long term antibiotics.  If she fails, would either try transvaginal drainage under anesthesia vs exploration.  LOS: 1 day    Shenica Holzheimer A 08/03/2013

## 2013-08-03 NOTE — Progress Notes (Signed)
[facility] PROGRESS NOTE  [name] NOB:[id number] DOB: Sep 23, 1950 DOA: 08/02/2013 PCP: [name], MD  Assessment/Plan: 1. Pelvic abscess. Complex collection had been noted on previous CT scan of abdomen and pelvis on 07/12/2013, and again noted on repeat CT scan on 07/28/2013 showing persistent loculated mid pelvic abscess which measured 3.6 x 2.3 cm compared to prior measurement of 3.3 x 2.4 cm. She was seen and evaluated by general surgery and felt to be a poor surgical candidate. Surgery recommending a prolonged course of antibiotic therapy. Transvaginal drainage could be considered if oral antibiotic therapy wasn't successful. 2. Partial small bowel obstruction. Patient having history of laparotomy, with the presence of intra-abdominal adhesions. Likely contributed to abdominal discomfort. Patient found to be high surgical risk. 3. Crohn's disease. Gastroenterology was consulted. Current issues likely not related to active Crohn's. 4. Severe protein calorie malnutrition. Continue feeding supplement. 5. Type 2 diabetes mellitus. Blood sugars controlled, continue SSI 6. Hypertension. Patient's blood pressures on the low side, will hold diltiazem for now.   Code Status: Full Code Family Communication: Family not present at bedside Disposition Plan: Continue IV AB's   Consultants:  General surgery  Gastroenterology  Antibiotics:  Zosyn IV  HPI/Subjective: Patient is a pleasant 63 year old female with a past medical history of Crohn's disease, status post colectomy and ileostomy in 2007 complicated by multiple intra-abdominal abscesses, was admitted to the medicine service on 08/02/2013. She presented with complaints of nausea vomiting and abdominal pain. She'll have also reported associated subjective fevers, chills, malaise and generalized weakness. A CT scan of abdomen and pelvis performed on 07/28/2013 showing persistent loculated mid pelvic abscess measuring 3.6  x 2.3 cm compared to prior measurement of 3.3 x 2.4 cm. She was started on Zosyn. General surgery was consulted as she was found to be a very poor surgical candidate. Given that fluid collection was multi-septated, an attempt to drain by interventional radiology would likely be unsuccessful. General surgery recommending long-term empiric antibiotic therapy with consideration for transvaginal drainage if this were to be unsuccessful.  Objective: Filed Vitals:   08/03/13 1112  BP: 103/85  Pulse: 80  Temp: 99.8 F (37.7 C)  Resp: 18    Intake/Output Summary (Last 24 hours) at 08/03/13 1501 Last data filed at 08/03/13 0735  Gross per 24 hour  Intake   1485 ml  Output      0 ml  Net   1485 ml   Filed Weights   08/02/13 1300  Weight: 56.5 kg (124 lb 9 oz)    Exam:   General:  Patient is in no acute distress, nontoxic appearing, awake alert and oriented  Cardiovascular: Regular rate rhythm normal S1 is  Respiratory: Clear to auscultation bilaterally  Abdomen: She has generalized pain to palpation, colostomy bag in place  Musculoskeletal: No edema  Data Reviewed: Basic Metabolic Panel:  Recent Labs Lab 08/02/13 0305 08/02/13 1225 08/02/13 1859 08/03/13 0703  NA 141  --  138 137  K 5.1  --  3.7 3.8  CL 100  --  99 101  CO2 26  --  26 27  GLUCOSE 101*  --  86 79  BUN 18  --  16 13  CREATININE 0.90 0.99 1.03 1.15*  CALCIUM 9.3  --  8.7 8.5   Liver Function Tests:  Recent Labs Lab 08/02/13 0305 08/03/13 0703  AST 11 9  ALT 10 7  ALKPHOS 71 56  BILITOT 0.2* 0.3  PROT 6.5 4.9*  ALBUMIN 2.7* 2.0*  Recent Labs Lab 08/02/13 0305  LIPASE 12   No results found for this basename: AMMONIA,  in the last 168 hours CBC:  Recent Labs Lab 08/02/13 0305 08/02/13 1225 08/03/13 0703  WBC 10.5 9.1 7.4  NEUTROABS 8.6*  --   --   HGB 12.4 11.9* 9.2*  HCT 37.3 36.6 28.3*  MCV 84.4 85.3 85.5  PLT 271 284 215   Cardiac Enzymes: No results found for this  basename: CKTOTAL, CKMB, CKMBINDEX, TROPONINI,  in the last 168 hours BNP (last 3 results) No results found for this basename: PROBNP,  in the last 8760 hours CBG:  Recent Labs Lab 08/02/13 2016 08/03/13 0024 08/03/13 0413 08/03/13 0733 08/03/13 1111  GLUCAP 87 73 86 82 154*    Recent Results (from the past 240 hour(s))  MRSA PCR SCREENING     Status: None   Collection Time    08/02/13  2:47 PM      Result Value Ref Range Status   MRSA by PCR NEGATIVE  NEGATIVE Final   Comment:            The GeneXpert MRSA Assay (FDA     approved for NASAL specimens     only), is one component of a     comprehensive MRSA colonization     surveillance program. It is not     intended to diagnose MRSA     infection nor to guide or     monitor treatment for     MRSA infections.     Studies: Dg Abd Acute W/chest  08/02/2013   CLINICAL DATA:  Abdominal and chest pain, Crohn history.  EXAM: ACUTE ABDOMEN SERIES (ABDOMEN 2 VIEW & CHEST 1 VIEW)  COMPARISON:  07/28/2013 CT, 07/10/2013 abdominal series  FINDINGS: Lungs remain hypoaerated with large right apical bullae. No confluent airspace opacity. Cardiomediastinal contours within normal range.  No free intraperitoneal air. Relative paucity of bowel gas other than a dilated loop of small bowel up to 4.7 cm within the left lower quadrant. Right lower quadrant ileostomy. Evidence of prior embolization perhaps along the gastroduodenal artery. No acute osseous finding.  IMPRESSION: Relative paucity of bowel gas other than a dilated bowel loop in the left lower quadrant up to 4.7 cm. Small-bowel obstruction not excluded.   Electronically Signed   By: Jearld Lesch M.D.   On: 08/02/2013 06:12    Scheduled Meds: . antiseptic oral rinse  15 mL Mouth Rinse BID  . arformoterol  15 mcg Nebulization BID  . aspirin EC  81 mg Oral Q M,W,F  . atorvastatin  40 mg Oral q1800  . budesonide  0.25 mg Nebulization BID  . calcium-vitamin D  1 tablet Oral BID  .  citalopram  20 mg Oral Daily  . diltiazem  180 mg Oral BID  . famotidine  20 mg Oral BID  . feeding supplement (PRO-STAT SUGAR FREE 64)  30 mL Oral Daily  . heparin  5,000 Units Subcutaneous 3 times per day  . hydroxychloroquine  200 mg Oral BID  . insulin aspart  0-9 Units Subcutaneous 6 times per day  . LORazepam  0.5 mg Oral BID  . pantoprazole  40 mg Oral Daily  . piperacillin-tazobactam (ZOSYN)  IV  3.375 g Intravenous Q8H  . predniSONE  40 mg Oral Q breakfast  . tiotropium  18 mcg Inhalation Daily   Continuous Infusions: . dextrose 5 % and 0.45% NaCl 75 mL/hr at 08/03/13 1259    Principal Problem:  Pelvic abscess in female Active Problems:   DIABETES, TYPE 2   CROHN'S DISEASE   Severe protein-calorie malnutrition   Hyperkalemia   Sepsis    Time spent: 35 minutes    Jeralyn Bennett  [facility] Pager [phone]. If 7PM-7AM, please contact night-coverage at [email or web address], password [facility] 08/03/2013, 3:01 PM  LOS: 1 day

## 2013-08-03 NOTE — Consult Note (Signed)
[facility] CONSULT Reason for consult: nausea and vomiting and abdominal pain Referring Physician: triad hospitalist. Primary G.I.: Dr. Jeanie Sewer is an 63 y.o. female.  HPI:  63 year old woman with a long history of Crohn's disease who has undergone total abdominal colectomy ileostomy 2007 due to intractable disease. She reports that she has not really had any evidence or chronic Crohn's at that time and has been followed by Dr. Wynetta Emery intermittently. Last year she was admitted with SBO and underwent exploratory laparotomy with lysis of adhesions and was noted to have a large amount of adhesions. She's had several admissions for nausea and vomiting with films showing partial SBO she was last admitted 2/15 4 similar symptoms. She was sent home. She has severe COPD it was not felt she could stand another operation. After her discharge, she followed up with Dr. Wynetta Emery and continue to have symptoms coming to the emergency room and nausea and vomiting and inability to keep down any food or liquids. CT scan showed a persistent loculated abscess approximately 3 cm in size. The small bowel was slightly dilated down to that point. The patient notes that she has been needing very little but is still continued to have output from her ileostomy. She has continued to have cramping abdominal pain. She is being followed by surgery who feels that she would be a very high-risk operative candidate due to her severe COPD and should be treated with long-term antibiotics 1st. If that fail the could try transvaginal drainage prior to laparotomy.  Past Medical History  Diagnosis Date  . Other diseases of vocal cords   . Osteoporosis, unspecified   . Other and unspecified hyperlipidemia   . Esophageal reflux   . Chronic airway obstruction, not elsewhere classified   . Regional enteritis of unspecified site   . 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 Crohn's disease      Ileostomy  . Pelvic adhesions   . Anxiety   . Depression   . Hypertension   . Emphysema   . Hematuria   . Severe cervical dysplasia age 31    cone biopsy with neg paps since  . Pneumonia     "3-4 times; necrotizing once" (04/19/2013)  . Asthmatic bronchitis , chronic   . Exertional shortness of breath   . On home oxygen therapy     "24/7; 2.5L @ rest; 3L when I'm doing my chores" (08/02/2013)  . Type II diabetes mellitus   . Anemia   . History of blood transfusion     "I've had 3 or 4" (04/19/2013)  . Rheumatoid arthritis(714.0) 03/2012    [place]; "mild in my hands" (04/19/2013)  . Small bowel obstruction due to adhesions 2014    [place]  . Pelvic abscess in female 2014    Past Surgical History  Procedure Laterality Date  . Ileostomy  2007  . Ear cyst excision Bilateral 1970's    "lobes"   . Cervical cone biopsy  1978  . Colon surgery  2007    subtotal colectomy with ileostomy  . Esophagogastroduodenoscopy (egd) with esophageal dilation      "3 times, I think" (04/19/2013)  . Oophorectomy      [facility]  . Tracheostomy  2010  . Hammer toe surgery Bilateral 1980's  . Balloon dilation  02/10/2012    Procedure: BALLOON DILATION;  Surgeon: Garlan Fair, MD;  Location: [facility];  Service: Endoscopy;  Laterality: N/A;  . Colposcopy    . Flexible sigmoidoscopy  03/26/2012    Procedure: FLEXIBLE SIGMOIDOSCOPY;  Surgeon: Garlan Fair, MD;  Location: [facility];  Service: Endoscopy;  Laterality: N/A;  . Cardiac catheterization  07/08/2007    normal  . Nm myocar perf wall motion  09/21/2006    no ischemia  . Ventral hernia repair N/A 03/16/2013    Procedure: REPAIR OF INCISIONAL AND PARASTOMAL HERNIAS;  Surgeon: Edward Jolly, MD;  Location: [place];  Service: General;  Laterality: N/A;  . Laparotomy N/A 03/16/2013    Procedure: EXPLORATORY LAPAROTOMY;  Surgeon: Edward Jolly, MD;  Location: [place];  Service: General;  Laterality: N/A;  . Lysis of adhesion N/A  03/16/2013    Procedure: LYSIS OF ADHESIONS FOR SMALL BOWEL OBSTRUCTION;  Surgeon: Edward Jolly, MD;  Location: [place];  Service: General;  Laterality: N/A;  . Insertion of mesh N/A 03/16/2013    Procedure: INSERTION OF BIOLOGIC MESH;  Surgeon: Edward Jolly, MD;  Location: [place];  Service: General;  Laterality: N/A;  . Tee without cardioversion N/A 03/21/2013    Procedure: TRANSESOPHAGEAL ECHOCARDIOGRAM (TEE);  Surgeon: Thayer Headings, MD;  Location: [place];  Service: Cardiovascular;  Laterality: N/A;  . Vaginal hysterectomy  1993    [facility]  . Tracheostomy closure  2010  . Hernia repair      Family History  Problem Relation Age of Onset  . Osteoporosis Mother   . Kidney disease Mother   . Hypertension Sister   . Stroke Sister   . Diabetes Sister     Social History:  reports that she quit smoking about 9 years ago. Her smoking use included Cigarettes. She has a 32 pack-year smoking history. She has never used smokeless tobacco. She reports that she drinks alcohol. She reports that she does not use illicit drugs.  Allergies:  Allergies  Allergen Reactions  . Esomeprazole Magnesium Other (See Comments)    NEXIUM - reaction > aggravated pt's Crohn's disease  . Shellfish Allergy Shortness Of Breath and Nausea And Vomiting  . Surgical Lubricant Other (See Comments)    Burns skin   . Other Nausea And Vomiting    Beans, Dander/Dust, Peas, Mushrooms  . Peanut-Containing Drug Products Nausea And Vomiting  . Sulfate     N/V  . Levaquin [Levofloxacin]     Medications; Prior to Admission medications   Medication Sig Start Date End Date Taking? Authorizing Provider  Amino Acids-Protein Hydrolys (FEEDING SUPPLEMENT, PRO-STAT SUGAR FREE 64,) LIQD Take 30 mLs by mouth daily. 07/06/13  Yes Edward Jolly, MD  arformoterol (BROVANA) 15 MCG/2ML NEBU Take 15 mcg by nebulization 2 (two) times daily.   Yes Historical Provider, MD  aspirin 81 MG tablet Take 81 mg by  mouth every Monday, Wednesday, and Friday.    Yes Historical Provider, MD  budesonide (PULMICORT) 0.25 MG/2ML nebulizer solution Take 0.25 mg by nebulization 2 (two) times daily.   Yes Historical Provider, MD  calcium-vitamin D (OSCAL WITH D) 500-200 MG-UNIT per tablet Take 1 tablet by mouth 2 (two) times daily.   Yes Historical Provider, MD  Cholecalciferol (VITAMIN D) 1000 UNITS capsule Take 1,000 Units by mouth daily.    Yes Historical Provider, MD  citalopram (CELEXA) 20 MG tablet Take 20 mg by mouth daily.    Yes Historical Provider, MD  diltiazem (TIAZAC) 180 MG 24 hr capsule Take 180 mg by mouth 2 (two) times daily.   Yes Historical Provider, MD  famotidine (PEPCID) 20 MG tablet Take 20 mg by mouth  2 (two) times daily.   Yes Historical Provider, MD  fluticasone (FLONASE) 50 MCG/ACT nasal spray Place 2 sprays into the nose daily as needed.    Yes Historical Provider, MD  guaiFENesin (MUCINEX) 600 MG 12 hr tablet Take 1,200 mg by mouth 2 (two) times daily.   Yes Historical Provider, MD  HYDROcodone-acetaminophen (NORCO/VICODIN) 5-325 MG per tablet Take 1-2 tablets by mouth every 6 (six) hours as needed for moderate pain or severe pain. 06/27/13  Yes Eugenie Filler, MD  hydroxychloroquine (PLAQUENIL) 200 MG tablet Take 200 mg by mouth Twice daily.  04/20/12  Yes Historical Provider, MD  IRON PO Take 1 tablet by mouth every Monday, Wednesday, and Friday.    Yes Historical Provider, MD  Liraglutide (VICTOZA) 18 MG/3ML SOLN Inject 1.8 mg into the skin daily. 1.8 units once daily   Yes Historical Provider, MD  LORazepam (ATIVAN) 0.5 MG tablet TAKE 1 TABLET BY MOUTH EVERY 8 HOURS AS NEEDED   Yes Elsie Stain, MD  omeprazole (PRILOSEC) 40 MG capsule Take 40 mg by mouth 2 (two) times daily.    Yes Historical Provider, MD  ondansetron (ZOFRAN) 4 MG tablet Take 1 tablet (4 mg total) by mouth every 6 (six) hours as needed for nausea. 04/29/13  Yes Thurnell Lose, MD  predniSONE (DELTASONE) 20 MG  tablet Take 2 tablets (40 mg total) by mouth daily with breakfast. 07/16/13  Yes Kinnie Feil, MD  PROAIR HFA 108 (90 BASE) MCG/ACT inhaler Inhale 2 puffs into the lungs every 6 (six) hours as needed for wheezing or shortness of breath.    Yes Historical Provider, MD  Respiratory Therapy Supplies (FLUTTER) DEVI Inhale into the lungs. 2-4 times daily   Yes Historical Provider, MD  rosuvastatin (CRESTOR) 5 MG tablet Take 5 mg by mouth every Monday, Wednesday, and Friday.    Yes Historical Provider, MD  sulfamethoxazole-trimethoprim (BACTRIM DS) 800-160 MG per tablet Take 1 tablet by mouth 2 (two) times daily. 08/01/13  Yes Historical Provider, MD  tiotropium (SPIRIVA) 18 MCG inhalation capsule Place 1 capsule (18 mcg total) into inhaler and inhale daily. 07/06/13  Yes Elsie Stain, MD  traMADol (ULTRAM) 50 MG tablet Take 1 tablet (50 mg total) by mouth every 6 (six) hours as needed for moderate pain or severe pain. 07/16/13  Yes Kinnie Feil, MD   . antiseptic oral rinse  15 mL Mouth Rinse BID  . arformoterol  15 mcg Nebulization BID  . aspirin EC  81 mg Oral Q M,W,F  . atorvastatin  40 mg Oral q1800  . budesonide  0.25 mg Nebulization BID  . calcium-vitamin D  1 tablet Oral BID  . citalopram  20 mg Oral Daily  . diltiazem  180 mg Oral BID  . famotidine  20 mg Oral BID  . feeding supplement (PRO-STAT SUGAR FREE 64)  30 mL Oral Daily  . heparin  5,000 Units Subcutaneous 3 times per day  . hydroxychloroquine  200 mg Oral BID  . insulin aspart  0-9 Units Subcutaneous 6 times per day  . LORazepam  0.5 mg Oral BID  . pantoprazole  40 mg Oral Daily  . piperacillin-tazobactam (ZOSYN)  IV  3.375 g Intravenous Q8H  . predniSONE  40 mg Oral Q breakfast  . tiotropium  18 mcg Inhalation Daily   PRN Meds albuterol, fluticasone, HYDROcodone-acetaminophen, morphine injection, ondansetron (ZOFRAN) IV, ondansetron, traMADol Results for orders placed during the hospital encounter of 08/02/13 (from  the past  48 hour(s))  CBC WITH DIFFERENTIAL     Status: Abnormal   Collection Time    08/02/13  3:05 AM      Result Value Ref Range   WBC 10.5  4.0 - 10.5 K/uL   RBC 4.42  3.87 - 5.11 MIL/uL   Hemoglobin 12.4  12.0 - 15.0 g/dL   HCT 37.3  36.0 - 46.0 %   MCV 84.4  78.0 - 100.0 fL   MCH 28.1  26.0 - 34.0 pg   MCHC 33.2  30.0 - 36.0 g/dL   RDW 13.8  11.5 - 15.5 %   Platelets 271  150 - 400 K/uL   Neutrophils Relative % 82 (*) 43 - 77 %   Neutro Abs 8.6 (*) 1.7 - 7.7 K/uL   Lymphocytes Relative 10 (*) 12 - 46 %   Lymphs Abs 1.1  0.7 - 4.0 K/uL   Monocytes Relative 8  3 - 12 %   Monocytes Absolute 0.9  0.1 - 1.0 K/uL   Eosinophils Relative 0  0 - 5 %   Eosinophils Absolute 0.0  0.0 - 0.7 K/uL   Basophils Relative 0  0 - 1 %   Basophils Absolute 0.0  0.0 - 0.1 K/uL  COMPREHENSIVE METABOLIC PANEL     Status: Abnormal   Collection Time    08/02/13  3:05 AM      Result Value Ref Range   Sodium 141  137 - 147 mEq/L   Potassium 5.1  3.7 - 5.3 mEq/L   Chloride 100  96 - 112 mEq/L   CO2 26  19 - 32 mEq/L   Glucose, Bld 101 (*) 70 - 99 mg/dL   BUN 18  6 - 23 mg/dL   Creatinine, Ser 0.90  0.50 - 1.10 mg/dL   Calcium 9.3  8.4 - 10.5 mg/dL   Total Protein 6.5  6.0 - 8.3 g/dL   Albumin 2.7 (*) 3.5 - 5.2 g/dL   AST 11  0 - 37 U/L   ALT 10  0 - 35 U/L   Alkaline Phosphatase 71  39 - 117 U/L   Total Bilirubin 0.2 (*) 0.3 - 1.2 mg/dL   GFR calc non Af Amer 67 (*) >90 mL/min   GFR calc Af Amer 77 (*) >90 mL/min   Comment: (NOTE)     The eGFR has been calculated using the CKD EPI equation.     This calculation has not been validated in all clinical situations.     eGFR's persistently <90 mL/min signify possible Chronic Kidney     Disease.  LIPASE, BLOOD     Status: None   Collection Time    08/02/13  3:05 AM      Result Value Ref Range   Lipase 12  11 - 59 U/L  URINALYSIS, ROUTINE W REFLEX MICROSCOPIC     Status: Abnormal   Collection Time    08/02/13  7:31 AM      Result Value Ref  Range   Color, Urine YELLOW  YELLOW   APPearance TURBID (*) CLEAR   Specific Gravity, Urine 1.029  1.005 - 1.030   pH 5.5  5.0 - 8.0   Glucose, UA NEGATIVE  NEGATIVE mg/dL   Hgb urine dipstick LARGE (*) NEGATIVE   Bilirubin Urine NEGATIVE  NEGATIVE   Ketones, ur 15 (*) NEGATIVE mg/dL   Protein, ur 30 (*) NEGATIVE mg/dL   Urobilinogen, UA 0.2  0.0 - 1.0 mg/dL   Nitrite NEGATIVE  NEGATIVE   Leukocytes, UA LARGE (*) NEGATIVE  URINE MICROSCOPIC-ADD ON     Status: Abnormal   Collection Time    08/02/13  7:31 AM      Result Value Ref Range   Squamous Epithelial / LPF RARE  RARE   WBC, UA TOO NUMEROUS TO COUNT  <3 WBC/hpf   RBC / HPF 3-6  <3 RBC/hpf   Bacteria, UA FEW (*) RARE   Crystals CA OXALATE CRYSTALS (*) NEGATIVE  GLUCOSE, CAPILLARY     Status: Abnormal   Collection Time    08/02/13 11:58 AM      Result Value Ref Range   Glucose-Capillary 102 (*) 70 - 99 mg/dL  HEMOGLOBIN A1C     Status: Abnormal   Collection Time    08/02/13 12:25 PM      Result Value Ref Range   Hemoglobin A1C 5.7 (*) <5.7 %   Comment: (NOTE)                                                                               According to the ADA Clinical Practice Recommendations for 2011, when     HbA1c is used as a screening test:      >=6.5%   Diagnostic of Diabetes Mellitus               (if abnormal result is confirmed)     5.7-6.4%   Increased risk of developing Diabetes Mellitus     References:Diagnosis and Classification of Diabetes Mellitus,Diabetes     EXNT,7001,74(BSWHQ 1):S62-S69 and Standards of Medical Care in             Diabetes - 2011,Diabetes Care,2011,34 (Suppl 1):S11-S61.   Mean Plasma Glucose 117 (*) <117 mg/dL   Comment: Performed at [facility]  CBC     Status: Abnormal   Collection Time    08/02/13 12:25 PM      Result Value Ref Range   WBC 9.1  4.0 - 10.5 K/uL   RBC 4.29  3.87 - 5.11 MIL/uL   Hemoglobin 11.9 (*) 12.0 - 15.0 g/dL   HCT 36.6  36.0 - 46.0 %   MCV 85.3   78.0 - 100.0 fL   MCH 27.7  26.0 - 34.0 pg   MCHC 32.5  30.0 - 36.0 g/dL   RDW 14.0  11.5 - 15.5 %   Platelets 284  150 - 400 K/uL  CREATININE, SERUM     Status: Abnormal   Collection Time    08/02/13 12:25 PM      Result Value Ref Range   Creatinine, Ser 0.99  0.50 - 1.10 mg/dL   GFR calc non Af Amer 59 (*) >90 mL/min   GFR calc Af Amer 69 (*) >90 mL/min   Comment: (NOTE)     The eGFR has been calculated using the CKD EPI equation.     This calculation has not been validated in all clinical situations.     eGFR's persistently <90 mL/min signify possible Chronic Kidney     Disease.  MRSA PCR SCREENING     Status: None   Collection Time    08/02/13  2:47 PM  Result Value Ref Range   MRSA by PCR NEGATIVE  NEGATIVE   Comment:            The GeneXpert MRSA Assay (FDA     approved for NASAL specimens     only), is one component of a     comprehensive MRSA colonization     surveillance program. It is not     intended to diagnose MRSA     infection nor to guide or     monitor treatment for     MRSA infections.  GLUCOSE, CAPILLARY     Status: None   Collection Time    08/02/13  4:43 PM      Result Value Ref Range   Glucose-Capillary 89  70 - 99 mg/dL  BASIC METABOLIC PANEL     Status: Abnormal   Collection Time    08/02/13  6:59 PM      Result Value Ref Range   Sodium 138  137 - 147 mEq/L   Potassium 3.7  3.7 - 5.3 mEq/L   Comment: DELTA CHECK NOTED   Chloride 99  96 - 112 mEq/L   CO2 26  19 - 32 mEq/L   Glucose, Bld 86  70 - 99 mg/dL   BUN 16  6 - 23 mg/dL   Creatinine, Ser 1.03  0.50 - 1.10 mg/dL   Calcium 8.7  8.4 - 10.5 mg/dL   GFR calc non Af Amer 57 (*) >90 mL/min   GFR calc Af Amer 66 (*) >90 mL/min   Comment: (NOTE)     The eGFR has been calculated using the CKD EPI equation.     This calculation has not been validated in all clinical situations.     eGFR's persistently <90 mL/min signify possible Chronic Kidney     Disease.  GLUCOSE, CAPILLARY     Status:  None   Collection Time    08/02/13  8:16 PM      Result Value Ref Range   Glucose-Capillary 87  70 - 99 mg/dL  GLUCOSE, CAPILLARY     Status: None   Collection Time    08/03/13 12:24 AM      Result Value Ref Range   Glucose-Capillary 73  70 - 99 mg/dL  GLUCOSE, CAPILLARY     Status: None   Collection Time    08/03/13  4:13 AM      Result Value Ref Range   Glucose-Capillary 86  70 - 99 mg/dL  COMPREHENSIVE METABOLIC PANEL     Status: Abnormal   Collection Time    08/03/13  7:03 AM      Result Value Ref Range   Sodium 137  137 - 147 mEq/L   Potassium 3.8  3.7 - 5.3 mEq/L   Chloride 101  96 - 112 mEq/L   CO2 27  19 - 32 mEq/L   Glucose, Bld 79  70 - 99 mg/dL   BUN 13  6 - 23 mg/dL   Creatinine, Ser 1.15 (*) 0.50 - 1.10 mg/dL   Calcium 8.5  8.4 - 10.5 mg/dL   Total Protein 4.9 (*) 6.0 - 8.3 g/dL   Albumin 2.0 (*) 3.5 - 5.2 g/dL   AST 9  0 - 37 U/L   ALT 7  0 - 35 U/L   Alkaline Phosphatase 56  39 - 117 U/L   Total Bilirubin 0.3  0.3 - 1.2 mg/dL   GFR calc non Af Amer 50 (*) >90 mL/min  GFR calc Af Amer 57 (*) >90 mL/min   Comment: (NOTE)     The eGFR has been calculated using the CKD EPI equation.     This calculation has not been validated in all clinical situations.     eGFR's persistently <90 mL/min signify possible Chronic Kidney     Disease.  GLUCOSE, CAPILLARY     Status: None   Collection Time    08/03/13  7:33 AM      Result Value Ref Range   Glucose-Capillary 82  70 - 99 mg/dL    Dg Abd Acute W/chest  08/02/2013   CLINICAL DATA:  Abdominal and chest pain, Crohn history.  EXAM: ACUTE ABDOMEN SERIES (ABDOMEN 2 VIEW & CHEST 1 VIEW)  COMPARISON:  07/28/2013 CT, 07/10/2013 abdominal series  FINDINGS: Lungs remain hypoaerated with large right apical bullae. No confluent airspace opacity. Cardiomediastinal contours within normal range.  No free intraperitoneal air. Relative paucity of bowel gas other than a dilated loop of small bowel up to 4.7 cm within the left lower  quadrant. Right lower quadrant ileostomy. Evidence of prior embolization perhaps along the gastroduodenal artery. No acute osseous finding.  IMPRESSION: Relative paucity of bowel gas other than a dilated bowel loop in the left lower quadrant up to 4.7 cm. Small-bowel obstruction not excluded.   Electronically Signed   By: Carlos Levering M.D.   On: 08/02/2013 06:12               Blood pressure 110/59, pulse 99, temperature 99.8 F (37.7 C), temperature source Oral, resp. rate 18, height 5' 1.02" (1.55 m), weight 56.5 kg (124 lb 9 oz), SpO2 100.00%.  Physical exam:   General-- very pleasant African-American female who was alert and oriented and in no distress Heart-- regular rate and rhythm without murmurs are gallops Lungs--clear Abdomen-- none distended with mild lower abdominal tenderness. Ostomy has browned stool and ostomy bag. Bowel sounds are present and appear grossly normal.   Assessment: 1. Partial SBO. This is in conjunction with an apparent pelvic abscess. Small bowel is slightly dilated prior to contact with this fluid collection suggesting it is likely stuck down and partially obstructed. This is likely causing her abdominal pain. I agree with Dr. Ninfa Linden that she is a very high surgical risk due to her malnutrition and severe COPD and that anything short of surgery should be tried 1st. 2. Crohn's disease. Status post total colectomy and terminal ileum resection without any clinical recurrence sets and 3. History of SBO requiring laparotomy with finding of marked intra-abdominal adhesions 4. Malnutrition 5. COPD requiring home oxygen 24/7 5. Type II diabetes  Plan: 1. Agree with a course of prolonged antibiotics. Hopefully the abscess will shrink. If not it may be able to be excised transvaginally rather than requiring open laparotomy 2. Will need to pay attention to her nutrition. She will likely need to be in very low residue diet long-term with  supplements.   Trysta Showman JR,Danniel Tones L 08/03/2013, 9:07 AM

## 2013-08-04 DIAGNOSIS — R110 Nausea: Secondary | ICD-10-CM

## 2013-08-04 DIAGNOSIS — K651 Peritoneal abscess: Secondary | ICD-10-CM

## 2013-08-04 LAB — GLUCOSE, CAPILLARY
GLUCOSE-CAPILLARY: 116 mg/dL — AB (ref 70–99)
GLUCOSE-CAPILLARY: 144 mg/dL — AB (ref 70–99)
GLUCOSE-CAPILLARY: 133 mg/dL — AB (ref 70–99)
Glucose-Capillary: 107 mg/dL — ABNORMAL HIGH (ref 70–99)
Glucose-Capillary: 120 mg/dL — ABNORMAL HIGH (ref 70–99)
Glucose-Capillary: 95 mg/dL (ref 70–99)

## 2013-08-04 LAB — CBC
HEMATOCRIT: 28 % — AB (ref 36.0–46.0)
Hemoglobin: 9.2 g/dL — ABNORMAL LOW (ref 12.0–15.0)
MCH: 27.8 pg (ref 26.0–34.0)
MCHC: 32.9 g/dL (ref 30.0–36.0)
MCV: 84.6 fL (ref 78.0–100.0)
Platelets: 219 10*3/uL (ref 150–400)
RBC: 3.31 MIL/uL — ABNORMAL LOW (ref 3.87–5.11)
RDW: 13.8 % (ref 11.5–15.5)
WBC: 9.2 10*3/uL (ref 4.0–10.5)

## 2013-08-04 LAB — BASIC METABOLIC PANEL
BUN: 5 mg/dL — AB (ref 6–23)
CALCIUM: 8.4 mg/dL (ref 8.4–10.5)
CHLORIDE: 106 meq/L (ref 96–112)
CO2: 23 mEq/L (ref 19–32)
CREATININE: 0.9 mg/dL (ref 0.50–1.10)
GFR calc non Af Amer: 67 mL/min — ABNORMAL LOW (ref >90)
GFR, EST AFRICAN AMERICAN: 77 mL/min — AB (ref >90)
Glucose, Bld: 116 mg/dL — ABNORMAL HIGH (ref 70–99)
Potassium: 4.2 mEq/L (ref 3.7–5.3)
Sodium: 141 mEq/L (ref 137–147)

## 2013-08-04 MED ORDER — METOCLOPRAMIDE HCL 5 MG/ML IJ SOLN
10.0000 mg | Freq: Three times a day (TID) | INTRAMUSCULAR | Status: DC
  Administered 2013-08-04 – 2013-08-05 (×4): 10 mg via INTRAVENOUS
  Filled 2013-08-04 (×7): qty 2

## 2013-08-04 NOTE — Progress Notes (Signed)
Patient ID: [name], female   DOB: May 24, 1951, 63 y.o.   MRN: [id number]    Subjective: Pt feels better today.  Tolerating clear liquids.  Pain is much less  Objective: Vital signs in last 24 hours: Temp:  [98 F (36.7 C)-99.8 F (37.7 C)] 98 F (36.7 C) (03/12 0500) Pulse Rate:  [72-87] 72 (03/12 0500) Resp:  [18] 18 (03/12 0500) BP: (91-113)/(45-85) 113/45 mmHg (03/12 0500) SpO2:  [99 %-100 %] 100 % (03/12 0500) Weight:  [105 lb 11.2 oz (47.945 kg)] 105 lb 11.2 oz (47.945 kg) (03/11 2100) Last BM Date: 08/04/13  Intake/Output from previous day: 03/11 0701 - 03/12 0700 In: 600 [I.V.:600] Out: -  Intake/Output this shift: Total I/O In: 236.3 [I.V.:236.3] Out: -   PE: Abd: soft, less tender, +BS, ND, ostomy is working well  Lab Results:   Recent Labs  08/03/13 0703 08/04/13 0522  WBC 7.4 9.2  HGB 9.2* 9.2*  HCT 28.3* 28.0*  PLT 215 219   BMET  Recent Labs  08/03/13 0703 08/04/13 0522  NA 137 141  K 3.8 4.2  CL 101 106  CO2 27 23  GLUCOSE 79 116*  BUN 13 5*  CREATININE 1.15* 0.90  CALCIUM 8.5 8.4   PT/INR No results found for this basename: LABPROT, INR,  in the last 72 hours CMP     Component Value Date/Time   NA 141 08/04/2013 0522   K 4.2 08/04/2013 0522   CL 106 08/04/2013 0522   CO2 23 08/04/2013 0522   GLUCOSE 116* 08/04/2013 0522   BUN 5* 08/04/2013 0522   CREATININE 0.90 08/04/2013 0522   CREATININE 1.04 08/27/2012 1054   CALCIUM 8.4 08/04/2013 0522   CALCIUM 9.0 07/15/2011 0920   PROT 4.9* 08/03/2013 0703   ALBUMIN 2.0* 08/03/2013 0703   AST 9 08/03/2013 0703   ALT 7 08/03/2013 0703   ALKPHOS 56 08/03/2013 0703   BILITOT 0.3 08/03/2013 0703   GFRNONAA 67* 08/04/2013 0522   GFRAA 77* 08/04/2013 0522   Lipase     Component Value Date/Time   LIPASE 12 08/02/2013 0305       Studies/Results: No results found.  Anti-infectives: Anti-infectives   Start     Dose/Rate Route Frequency Ordered Stop   08/02/13 2200  piperacillin-tazobactam  (ZOSYN) IVPB 3.375 g     3.375 g 12.5 mL/hr over 240 Minutes Intravenous Every 8 hours 08/02/13 1305     08/02/13 1330  piperacillin-tazobactam (ZOSYN) IVPB 3.375 g     3.375 g 12.5 mL/hr over 240 Minutes Intravenous  Once 08/02/13 1305 08/02/13 1822   08/02/13 1300  hydroxychloroquine (PLAQUENIL) tablet 200 mg     200 mg Oral 2 times daily 08/02/13 1201         Assessment/Plan  1. Intra-abdominal fluid collection 2. Crohn's disease 3. Chronic Nausea  Plan: 1. Cont IV abx therapy.  Will likely need repeat CT scan at some point in the future.  At least a week after her first scan. 2. Agree with GI's plan 3. Will try to avoid surgery if at all possible.  Will follow.   LOS: 2 days    Aleczander Fandino E 08/04/2013, 10:43 AM Pager: [phone]

## 2013-08-04 NOTE — Progress Notes (Signed)
[facility] PROGRESS NOTE  [name] MVE:[id number] DOB: 01-24-1951 DOA: 08/02/2013 PCP: [name], MD  Assessment/Plan: 1. Pelvic abscess. Complex collection had been noted on previous CT scan of abdomen and pelvis on 07/12/2013, and again noted on repeat CT scan on 07/28/2013 showing persistent loculated mid pelvic abscess which measured 3.6 x 2.3 cm compared to prior measurement of 3.3 x 2.4 cm. She was seen and evaluated by general surgery and felt to be a poor surgical candidate. Surgery recommending a prolonged course of antibiotic therapy. Transvaginal drainage could be considered if oral antibiotic therapy wasn't successful. Patient reports some improvement on 08/04/13.  2. Partial small bowel obstruction. Patient having history of laparotomy and presence of intra-abdominal adhesions.  3. Crohn's disease. Gastroenterology was consulted. Current issues likely not related to active Crohn's. 4. Severe protein calorie malnutrition. Continue feeding supplement. 5. Type 2 diabetes mellitus. Blood sugars controlled, continue SSI 6. Hypertension. Patient's blood pressures on the low side, will hold diltiazem for now.   Code Status: Full Code Family Communication: Family not present at bedside Disposition Plan: Continue IV AB's   Consultants:  General surgery  Gastroenterology  Antibiotics:  Zosyn IV  HPI/Subjective: Patient is a pleasant 63 year old female with a past medical history of Crohn's disease, status post colectomy and ileostomy in 2007 complicated by multiple intra-abdominal abscesses, was admitted to the medicine service on 08/02/2013. She presented with complaints of nausea vomiting and abdominal pain. She'll have also reported associated subjective fevers, chills, malaise and generalized weakness. A CT scan of abdomen and pelvis performed on 07/28/2013 showing persistent loculated mid pelvic abscess measuring 3.6 x 2.3 cm compared to prior measurement of  3.3 x 2.4 cm. She was started on Zosyn. General surgery was consulted as she was found to be a very poor surgical candidate. Given that fluid collection was multi-septated, an attempt to drain by interventional radiology would likely be unsuccessful. General surgery recommending long-term empiric antibiotic therapy with consideration for transvaginal drainage if this were to be unsuccessful.  Objective: Filed Vitals:   08/04/13 1354  BP: 106/51  Pulse: 71  Temp: 98 F (36.7 C)  Resp: 19    Intake/Output Summary (Last 24 hours) at 08/04/13 1703 Last data filed at 08/04/13 1300  Gross per 24 hour  Intake 1556.25 ml  Output      0 ml  Net 1556.25 ml   Filed Weights   08/02/13 1300 08/03/13 2100  Weight: 56.5 kg (124 lb 9 oz) 47.945 kg (105 lb 11.2 oz)    Exam:   General:  Patient is in no acute distress, nontoxic appearing, awake alert and oriented  Cardiovascular: Regular rate rhythm normal S1 S2  Respiratory: Clear to auscultation bilaterally  Abdomen: She has generalized pain to palpation, colostomy bag in place, pain seems improved  Musculoskeletal: No edema  Data Reviewed: Basic Metabolic Panel:  Recent Labs Lab 08/02/13 0305 08/02/13 1225 08/02/13 1859 08/03/13 0703 08/04/13 0522  NA 141  --  138 137 141  K 5.1  --  3.7 3.8 4.2  CL 100  --  99 101 106  CO2 26  --  26 27 23   GLUCOSE 101*  --  86 79 116*  BUN 18  --  16 13 5*  CREATININE 0.90 0.99 1.03 1.15* 0.90  CALCIUM 9.3  --  8.7 8.5 8.4   Liver Function Tests:  Recent Labs Lab 08/02/13 0305 08/03/13 0703  AST 11 9  ALT 10 7  ALKPHOS 71 56  BILITOT 0.2* 0.3  PROT 6.5 4.9*  ALBUMIN 2.7* 2.0*    Recent Labs Lab 08/02/13 0305  LIPASE 12   No results found for this basename: AMMONIA,  in the last 168 hours CBC:  Recent Labs Lab 08/02/13 0305 08/02/13 1225 08/03/13 0703 08/04/13 0522  WBC 10.5 9.1 7.4 9.2  NEUTROABS 8.6*  --   --   --   HGB 12.4 11.9* 9.2* 9.2*  HCT 37.3 36.6  28.3* 28.0*  MCV 84.4 85.3 85.5 84.6  PLT 271 284 215 219   Cardiac Enzymes: No results found for this basename: CKTOTAL, CKMB, CKMBINDEX, TROPONINI,  in the last 168 hours BNP (last 3 results) No results found for this basename: PROBNP,  in the last 8760 hours CBG:  Recent Labs Lab 08/03/13 2359 08/04/13 0401 08/04/13 0804 08/04/13 1155 08/04/13 1623  GLUCAP 120* 116* 107* 95 144*    Recent Results (from the past 240 hour(s))  MRSA PCR SCREENING     Status: None   Collection Time    08/02/13  2:47 PM      Result Value Ref Range Status   MRSA by PCR NEGATIVE  NEGATIVE Final   Comment:            The GeneXpert MRSA Assay (FDA     approved for NASAL specimens     only), is one component of a     comprehensive MRSA colonization     surveillance program. It is not     intended to diagnose MRSA     infection nor to guide or     monitor treatment for     MRSA infections.     Studies: No results found.  Scheduled Meds: . antiseptic oral rinse  15 mL Mouth Rinse BID  . arformoterol  15 mcg Nebulization BID  . aspirin EC  81 mg Oral Q M,W,F  . atorvastatin  40 mg Oral q1800  . budesonide  0.25 mg Nebulization BID  . calcium-vitamin D  1 tablet Oral BID  . citalopram  20 mg Oral Daily  . famotidine  20 mg Oral BID  . feeding supplement (PRO-STAT SUGAR FREE 64)  30 mL Oral Daily  . heparin  5,000 Units Subcutaneous 3 times per day  . hydroxychloroquine  200 mg Oral BID  . insulin aspart  0-9 Units Subcutaneous 6 times per day  . LORazepam  0.5 mg Oral BID  . metoCLOPramide (REGLAN) injection  10 mg Intravenous 3 times per day  . pantoprazole  40 mg Oral Daily  . piperacillin-tazobactam (ZOSYN)  IV  3.375 g Intravenous Q8H  . predniSONE  40 mg Oral Q breakfast  . tiotropium  18 mcg Inhalation Daily   Continuous Infusions: . dextrose 5 % and 0.45% NaCl 75 mL/hr at 08/04/13 1215    Principal Problem:   Pelvic abscess in female Active Problems:   DIABETES, TYPE  2   CROHN'S DISEASE   Severe protein-calorie malnutrition   Hyperkalemia   Sepsis    Time spent: 35 minutes    Jeralyn Bennett  [facility] Pager [phone]. If 7PM-7AM, please contact night-coverage at [email or web address], password [facility] 08/04/2013, 5:03 PM  LOS: 2 days

## 2013-08-04 NOTE — Progress Notes (Signed)
Patient active with [facility] Care Management services for COPD and DM management. Came to visit patient at bedside. However, she was sleeping soundly. Did not want to awake. Will make inpatient RNCM aware [facility] Care Management following.  [name], MSN- RN,BSN- [facility] [phone]

## 2013-08-04 NOTE — Progress Notes (Signed)
Chaplain provided ministry of presence and emotional support. Chaplain provided spiritual support through prayer. Patient said "she has has battling illness since October and she just wants to get well." Chaplain provided a listening ear as the patient shared that her mother passed away last year and her relationship with her sister and friends.    08/04/13 1400  Clinical Encounter Type  Visited With Patient  Visit Type Initial;Spiritual support  Referral From Nurse  Spiritual Encounters  Spiritual Needs Prayer;Emotional  Stress Factors  Patient Stress Factors Health changes

## 2013-08-04 NOTE — Progress Notes (Signed)
[facility] PROGRESS NOTE Subjective Feeling some better, nausea w/o vomiting  Objective: Vital signs in last 24 hours: Temp:  [98 F (36.7 C)-99.8 F (37.7 C)] 98 F (36.7 C) (03/12 0500) Pulse Rate:  [72-87] 72 (03/12 0500) Resp:  [18] 18 (03/12 0500) BP: (91-113)/(45-85) 113/45 mmHg (03/12 0500) SpO2:  [99 %-100 %] 100 % (03/12 0500) Weight:  [47.945 kg (105 lb 11.2 oz)] 47.945 kg (105 lb 11.2 oz) (03/11 2100) Last BM Date: 08/02/13  Intake/Output from previous day: 03/11 0701 - 03/12 0700 In: 600 [I.V.:600] Out: -  Intake/Output this shift:    PE:  Abdomen--soft, nondistended, nontender  Lab Results:  Recent Labs  08/02/13 0305 08/02/13 1225 08/03/13 0703 08/04/13 0522  WBC 10.5 9.1 7.4 9.2  HGB 12.4 11.9* 9.2* 9.2*  HCT 37.3 36.6 28.3* 28.0*  PLT 271 284 215 219   BMET  Recent Labs  08/02/13 0305 08/02/13 1225 08/02/13 1859 08/03/13 0703 08/04/13 0522  NA 141  --  138 137 141  K 5.1  --  3.7 3.8 4.2  CL 100  --  99 101 106  CO2 26  --  26 27 23   CREATININE 0.90 0.99 1.03 1.15* 0.90   LFT  Recent Labs  08/02/13 0305 08/03/13 0703  PROT 6.5 4.9*  AST 11 9  ALT 10 7  ALKPHOS 71 56  BILITOT 0.2* 0.3   PT/INR No results found for this basename: LABPROT, INR,  in the last 72 hours PANCREAS  Recent Labs  08/02/13 0305  LIPASE 12         Studies/Results: No results found.  Medications: I have reviewed the patient's current medications.  Assessment/Plan: 1. Partial SBO, pelvic abscess. On AB. Still with nausea. Denies ever having Reglan in past and willing to try. SEs discussed. Will start low dose iv and see if this helps her nausea.   Robt Okuda JR,Jamyah Folk L 08/04/2013, 8:08 AM

## 2013-08-04 NOTE — Progress Notes (Signed)
I have seen and examined the patient and agree with the assessment and plans.  I discussed her case with our colorectal surgeon.  She would be at high risk for fistula formation with transvaginal drainage.  Recommend long term antibiotics  [name] A. Magnus Ivan  MD, FACS

## 2013-08-05 DIAGNOSIS — K5090 Crohn's disease, unspecified, without complications: Secondary | ICD-10-CM

## 2013-08-05 DIAGNOSIS — R627 Adult failure to thrive: Secondary | ICD-10-CM

## 2013-08-05 DIAGNOSIS — K219 Gastro-esophageal reflux disease without esophagitis: Secondary | ICD-10-CM

## 2013-08-05 LAB — GLUCOSE, CAPILLARY
GLUCOSE-CAPILLARY: 98 mg/dL (ref 70–99)
Glucose-Capillary: 108 mg/dL — ABNORMAL HIGH (ref 70–99)
Glucose-Capillary: 130 mg/dL — ABNORMAL HIGH (ref 70–99)
Glucose-Capillary: 74 mg/dL (ref 70–99)

## 2013-08-05 MED ORDER — METOCLOPRAMIDE HCL 10 MG PO TABS: 10.0000 mg | ORAL_TABLET | Freq: Three times a day (TID) | ORAL | Status: DC

## 2013-08-05 MED ORDER — CEFUROXIME AXETIL 500 MG PO TABS: 500.0000 mg | ORAL_TABLET | Freq: Two times a day (BID) | ORAL | Status: DC

## 2013-08-05 MED ORDER — HYDROCODONE-ACETAMINOPHEN 5-325 MG PO TABS: 1.0000 | ORAL_TABLET | Freq: Four times a day (QID) | ORAL | Status: DC | PRN

## 2013-08-05 MED ORDER — PRO-STAT SUGAR FREE PO LIQD: 30.0000 mL | Freq: Every day | ORAL | Status: DC

## 2013-08-05 MED ORDER — AMOXICILLIN-POT CLAVULANATE 875-125 MG PO TABS: 1.0000 | ORAL_TABLET | Freq: Two times a day (BID) | ORAL | Status: DC

## 2013-08-05 NOTE — Progress Notes (Signed)
Patient discharged.  Patient educated on discharge instructions, MRSA and VRE education forms, and discharge medications. Discharge prescriptions given to patient. Patient verbalized understanding and AVS signed.  IV removed. Follow-up appointment already scheduled.  Patient escorted off unit with wheelchair and RN.

## 2013-08-05 NOTE — Progress Notes (Signed)
I have seen and examined the patient and agree with the assessment and plans.  Jaicob Dia A. Labaron Digirolamo  MD, FACS  

## 2013-08-05 NOTE — Progress Notes (Signed)
Came to visit patient prior to discharge to make aware that [facility] Coordinator will follow up with her post hospital discharge. Appreciative of visit.  [name], MSN- RN,BSN- [facility] [phone]

## 2013-08-05 NOTE — Discharge Summary (Signed)
Physician Discharge Summary  [name] [facility]:[id number] DOB: Nov 23, 1950 DOA: 08/02/2013  PCP: [name], MD  Admit date: 08/02/2013 Discharge date: 08/05/2013  Time spent: 35 minutes  Recommendations for Outpatient Follow-up:  1. Follow up on repeat CBC and BMP in 1 week  Discharge Diagnoses:  Principal Problem:   Pelvic abscess in female Active Problems:   DIABETES, TYPE 2   CROHN'S DISEASE   Severe protein-calorie malnutrition   Hyperkalemia   Sepsis   Discharge Condition: Stable  Diet recommendation: Heart Healthy  Filed Weights   08/02/13 1300 08/03/13 2100 08/04/13 2011  Weight: 56.5 kg (124 lb 9 oz) 47.945 kg (105 lb 11.2 oz) 50.2 kg (110 lb 10.7 oz)    History of present illness:  [name] is a 63 y.o. 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 She relates she has not been able to keep anything up for more than 2 days. She feels very tired and weak and myalgias. She has been putting out more liquid stool and output from her ostomy has increased. She had a previous CT on 07/28/2013 by her gastroenterologist that showed a neurology disease she was referred to the emergency room.    Hospital Course:  Patient is a pleasant 63 year old female with a past medical history of Crohn's disease, status post colectomy and ileostomy in 2007 complicated by multiple intra-abdominal abscesses, was admitted to the medicine service on 08/02/2013. She presented  with complaints of nausea vomiting and abdominal pain. She'll have also reported associated subjective fevers, chills, malaise and generalized weakness. A CT scan of abdomen and pelvis performed on 07/28/2013 showing persistent loculated mid pelvic abscess measuring 3.6 x 2.3 cm compared to prior measurement of 3.3 x 2.4 cm. She was started on Zosyn. General surgery was consulted as she was found to be a very poor surgical candidate. Given that fluid collection was multi-septated, an attempt to drain by interventional radiology would likely be unsuccessful. General surgery recommending long-term empiric antibiotic therapy with consideration for transvaginal drainage if this were to be unsuccessful. By 08/05/2013 she reported feeling much better, was tolerating a regular diet. She reported significant improvement to her abdominal symptoms. Prior to discharge discussed case with general surgery, recommending a three-week course of antibiotic therapy. Initially Augmentin was recommended however patient reporting nausea vomiting after taking this medication for which she was discharged on Ceftin. She was discharged home in stable condition on 08/05/2013.    Consultations:  General surgery  GI  Discharge Exam: Filed Vitals:   08/05/13 1117  BP: 122/81  Pulse: 103  Temp: 98.8 F (37.1 C)  Resp: 18    General: Patient is in no acute distress, nontoxic appearing, awake alert and oriented  Cardiovascular: Regular rate rhythm normal S1 S2  Respiratory: Clear to auscultation bilaterally  Abdomen: She has generalized pain to palpation, colostomy bag in place, pain seems improved  Musculoskeletal: No edema   Discharge Instructions  Discharge Orders   Future Appointments Provider Department Dept Phone   08/11/2013 9:00 AM Storm Frisk, MD [facility] [phone]  08/17/2013 4:10 PM Mariella SaaBenjamin T Hoxworth, MD [facility] [place] [phone]   Future Orders Complete By Expires    Call MD for:  difficulty breathing, headache or visual disturbances  As directed    Call MD for:  extreme fatigue  As directed    Call MD for:  persistant dizziness or light-headedness  As directed    Call MD for:  persistant nausea and vomiting  As directed    Call MD for:  severe uncontrolled pain  As directed    Call MD for:  temperature >100.4  As directed    Diet - low sodium heart healthy  As directed    Increase activity slowly  As directed        Medication List    STOP taking these medications       diltiazem 180 MG 24 hr capsule  Commonly known as:  TIAZAC     sulfamethoxazole-trimethoprim 800-160 MG per tablet  Commonly known as:  BACTRIM DS     VICTOZA 18 MG/3ML Soln injection  Generic drug:  Liraglutide      TAKE these medications       arformoterol 15 MCG/2ML Nebu  Commonly known as:  BROVANA  Take 15 mcg by nebulization 2 (two) times daily.     aspirin 81 MG tablet  Take 81 mg by mouth every Monday, Wednesday, and Friday.     budesonide 0.25 MG/2ML nebulizer solution  Commonly known as:  PULMICORT  Take 0.25 mg by nebulization 2 (two) times daily.     calcium-vitamin D 500-200 MG-UNIT per tablet  Commonly known as:  OSCAL WITH D  Take 1 tablet by mouth 2 (two) times daily.     cefUROXime 500 MG tablet  Commonly known as:  CEFTIN  Take 1 tablet (500 mg total) by mouth 2 (two) times daily with a meal.     citalopram 20 MG tablet  Commonly known as:  CELEXA  Take 20 mg by mouth daily.     famotidine 20 MG tablet  Commonly known as:  PEPCID  Take 20 mg by mouth 2 (two) times daily.     feeding supplement (PRO-STAT SUGAR FREE 64) Liqd  Take 30 mLs by mouth daily.     feeding supplement (PRO-STAT SUGAR FREE 64) Liqd  Take 30 mLs by mouth daily.     fluticasone 50 MCG/ACT nasal spray  Commonly known as:  FLONASE  Place 2 sprays into the nose daily as needed.     FLUTTER Devi  Inhale into the lungs. 2-4 times daily     guaiFENesin 600 MG 12  hr tablet  Commonly known as:  MUCINEX  Take 1,200 mg by mouth 2 (two) times daily.     HYDROcodone-acetaminophen 5-325 MG per tablet  Commonly known as:  NORCO/VICODIN  Take 1-2 tablets by mouth every 6 (six) hours as needed for moderate pain or severe pain.     hydroxychloroquine 200 MG tablet  Commonly known as:  PLAQUENIL  Take 200 mg by mouth Twice daily.     IRON PO  Take 1 tablet by mouth every Monday, Wednesday, and Friday.     LORazepam 0.5 MG tablet  Commonly known as:  ATIVAN  TAKE 1 TABLET BY MOUTH EVERY 8 HOURS AS NEEDED     metoCLOPramide 10 MG tablet  Commonly known as:  REGLAN  Take 1 tablet (10 mg total) by mouth 3 (three) times daily before meals.     omeprazole 40  MG capsule  Commonly known as:  PRILOSEC  Take 40 mg by mouth 2 (two) times daily.     ondansetron 4 MG tablet  Commonly known as:  ZOFRAN  Take 1 tablet (4 mg total) by mouth every 6 (six) hours as needed for nausea.     predniSONE 20 MG tablet  Commonly known as:  DELTASONE  Take 2 tablets (40 mg total) by mouth daily with breakfast.     PROAIR HFA 108 (90 BASE) MCG/ACT inhaler  Generic drug:  albuterol  Inhale 2 puffs into the lungs every 6 (six) hours as needed for wheezing or shortness of breath.     rosuvastatin 5 MG tablet  Commonly known as:  CRESTOR  Take 5 mg by mouth every Monday, Wednesday, and Friday.     tiotropium 18 MCG inhalation capsule  Commonly known as:  SPIRIVA  Place 1 capsule (18 mcg total) into inhaler and inhale daily.     traMADol 50 MG tablet  Commonly known as:  ULTRAM  Take 1 tablet (50 mg total) by mouth every 6 (six) hours as needed for moderate pain or severe pain.     Vitamin D 1000 UNITS capsule  Take 1,000 Units by mouth daily.       Allergies  Allergen Reactions  . Esomeprazole Magnesium Other (See Comments)    NEXIUM - reaction > aggravated pt's Crohn's disease  . Shellfish Allergy Shortness Of Breath and Nausea And Vomiting  . Surgical  Lubricant Other (See Comments)    Burns skin   . Other Nausea And Vomiting    Beans, Dander/Dust, Peas, Mushrooms  . Peanut-Containing Drug Products Nausea And Vomiting  . Sulfate     N/V  . Levaquin [Levofloxacin]        Follow-up Information   Follow up with Mariella Saa, MD. (already scheduled)    Specialty:  General Surgery   Contact information:   [address] [place] [address] [phone]        The results of significant diagnostics from this hospitalization (including imaging, microbiology, ancillary and laboratory) are listed below for reference.    Significant Diagnostic Studies: Nm Gastric Emptying  07/11/2013   CLINICAL DATA:  Abdominal pain  EXAM: NUCLEAR MEDICINE GASTRIC EMPTYING SCAN  TECHNIQUE: After oral ingestion of radiolabeled meal, sequential abdominal images were obtained for 120 minutes. Residual percentage of activity remaining within the stomach was calculated at 60 and 120 minutes.  COMPARISON:  None.  RADIOPHARMACEUTICALS:  2.0Technetium 99-m labeled sulfur colloid  FINDINGS: Expected location of the stomach in the left upper quadrant. Ingested meal empties the stomach gradually over the course of the study with 50% retention at 60 min and 4.0% retention at 120 min (normal retention less than 30% at a 120 min).  IMPRESSION: Normal gastric emptying study.   Electronically Signed   By: Loralie Champagne M.D.   On: 07/11/2013 16:54   Ct Abdomen Pelvis W Contrast  07/28/2013   CLINICAL DATA:  History of pelvic abscess, severe pain the last few days, also history of Crohn's and prior small bowel obstruction  EXAM: CT ABDOMEN AND PELVIS WITH CONTRAST  TECHNIQUE: Multidetector CT imaging of the abdomen and pelvis was performed using the standard protocol following bolus administration of intravenous contrast.  CONTRAST:  OMNIPAQUE IOHEXOL 300 MG/ML  SOLN  COMPARISON:  CT of the abdomen pelvis of 07/12/2013  FINDINGS: The lung bases are  clear. The liver enhances with no focal abnormality  and no ductal dilatation is seen. Gallbladder is contracted and no calcified gallstones are noted. The pancreas is normal in size and the pancreatic duct is not dilated. The adrenal glands and spleen are unremarkable. The stomach is distended with contrast media and no abnormality is evident. The kidneys enhance with no evidence of mass or hydronephrosis and no renal calculi are noted the abdominal aorta is normal in caliber. Small retroperitoneal nodes are present and stable.  The loculated collection near the vaginal cuff is again noted consistent with pelvic abscess of 3.6 x 2.3 cm compared to 3.3 x 2.4 cm previously. This is consistent with abscess with loculations. There is surrounding inflammatory reaction.  The urinary bladder is somewhat decompressed and difficult to assess. As noted previously there is dilatation of distal small bowel. Partial small bowel obstruction cannot be excluded. The transition point between decompressed and dilated small a does appear to be at the level of the pelvic abscess. The patient has previously undergone colectomy with only a portion of the distal rectosigmoid colon remaining. And ileostomy is noted in the right lower quadrant. No bony abnormality seen.  IMPRESSION: 1. Persistent loculated mid pelvic abscess of 3.6 x 2.3 cm compared to prior measurement of 3.3 x 2.4 cm. 2. Persistent dilatation of small bowel which appears to change caliber at the level of the pelvic abscess. However, small bowel leading to the ileostomy is normal in caliber.   Electronically Signed   By: Dwyane Dee M.D.   On: 07/28/2013 09:50   Ct Abdomen Pelvis W Contrast  07/12/2013   CLINICAL DATA:  Recurrent abdominal pain. Crohn's disease. Recurrent small-bowel obstructions and abscess. Previous total colectomy and ileostomy.  EXAM: CT ABDOMEN AND PELVIS WITH CONTRAST  TECHNIQUE: Multidetector CT imaging of the abdomen and pelvis was performed  using the standard protocol following bolus administration of intravenous contrast.  CONTRAST:  37mL OMNIPAQUE IOHEXOL 300 MG/ML  SOLN  COMPARISON:  06/21/2013  FINDINGS: Previous total colectomy and right lower quadrant ileostomy are again demonstrated. There is increased dilatation of distal small bowel loops seen in the lower abdomen and pelvis, with a transition point seen in the central pelvis, where there is increased small bowel wall thickening. This appears contiguous with the vaginal cuff where there is a complex cystic lesion measuring 2.4 x 3.3 cm on image 62, which is mildly increased in size compared to 1.6 x 2.7 cm previously. This constellation of findings suggests increased inflammatory process involving distal small bowel and possible abscess involving the vaginal cuff, with neoplasm considered less likely.  The liver, gallbladder, pancreas, spleen, adrenal glands, and kidneys are normal in appearance. No evidence of hydronephrosis. No abdominal or pelvic lymphadenopathy identified.  IMPRESSION: Increased distal small bowel obstruction. Increased small bowel wall thickening and small complex collection involving the vaginal cuff at the transition point in central pelvis, suspicious for recurrence of Crohn's disease, with neoplasm considered less likely.   Electronically Signed   By: Myles Rosenthal M.D.   On: 07/12/2013 13:56   Dg Abd Acute W/chest  08/02/2013   CLINICAL DATA:  Abdominal and chest pain, Crohn history.  EXAM: ACUTE ABDOMEN SERIES (ABDOMEN 2 VIEW & CHEST 1 VIEW)  COMPARISON:  07/28/2013 CT, 07/10/2013 abdominal series  FINDINGS: Lungs remain hypoaerated with large right apical bullae. No confluent airspace opacity. Cardiomediastinal contours within normal range.  No free intraperitoneal air. Relative paucity of bowel gas other than a dilated loop of small bowel up to 4.7 cm within the left  lower quadrant. Right lower quadrant ileostomy. Evidence of prior embolization perhaps along the  gastroduodenal artery. No acute osseous finding.  IMPRESSION: Relative paucity of bowel gas other than a dilated bowel loop in the left lower quadrant up to 4.7 cm. Small-bowel obstruction not excluded.   Electronically Signed   By: Jearld Lesch M.D.   On: 08/02/2013 06:12   Dg Abd Acute W/chest  07/10/2013   CLINICAL DATA:  63 year old female with left lower quadrant pain, colostomy. History of total colectomy Nausea and vomiting. Initial encounter. Crohn disease.  EXAM: ACUTE ABDOMEN SERIES (ABDOMEN 2 VIEW & CHEST 1 VIEW)  COMPARISON:  06/23/2013 and earlier.  FINDINGS: Semi upright AP view of the chest at 0358 hrs. Chronic scarring or atelectasis. Stable lung volumes. Normal cardiac size and mediastinal contours. Visualized tracheal air column is within normal limits. No pneumothorax or pneumoperitoneum. Advanced chronic osseous changes at the right shoulder.  No pneumoperitoneum. Stable epigastric embolization coils. Right lower quadrant ostomy re- identified. Gas-filled small bowel in the left abdomen measuring up to 34 mm diameter, similar to prior studies. No other dilated loops are evident. No acute osseous abnormality identified.  IMPRESSION: 1. No free air. Stable mildly prominent left abdominal small bowel. Right lower quadrant ostomy. 2.  No acute cardiopulmonary abnormality.   Electronically Signed   By: Augusto Gamble M.D.   On: 07/10/2013 04:17    Microbiology: Recent Results (from the past 240 hour(s))  MRSA PCR SCREENING     Status: None   Collection Time    08/02/13  2:47 PM      Result Value Ref Range Status   MRSA by PCR NEGATIVE  NEGATIVE Final   Comment:            The GeneXpert MRSA Assay (FDA     approved for NASAL specimens     only), is one component of a     comprehensive MRSA colonization     surveillance program. It is not     intended to diagnose MRSA     infection nor to guide or     monitor treatment for     MRSA infections.     Labs: Basic Metabolic  Panel:  Recent Labs Lab 08/02/13 0305 08/02/13 1225 08/02/13 1859 08/03/13 0703 08/04/13 0522  NA 141  --  138 137 141  K 5.1  --  3.7 3.8 4.2  CL 100  --  99 101 106  CO2 26  --  26 27 23   GLUCOSE 101*  --  86 79 116*  BUN 18  --  16 13 5*  CREATININE 0.90 0.99 1.03 1.15* 0.90  CALCIUM 9.3  --  8.7 8.5 8.4   Liver Function Tests:  Recent Labs Lab 08/02/13 0305 08/03/13 0703  AST 11 9  ALT 10 7  ALKPHOS 71 56  BILITOT 0.2* 0.3  PROT 6.5 4.9*  ALBUMIN 2.7* 2.0*    Recent Labs Lab 08/02/13 0305  LIPASE 12   No results found for this basename: AMMONIA,  in the last 168 hours CBC:  Recent Labs Lab 08/02/13 0305 08/02/13 1225 08/03/13 0703 08/04/13 0522  WBC 10.5 9.1 7.4 9.2  NEUTROABS 8.6*  --   --   --   HGB 12.4 11.9* 9.2* 9.2*  HCT 37.3 36.6 28.3* 28.0*  MCV 84.4 85.3 85.5 84.6  PLT 271 284 215 219   Cardiac Enzymes: No results found for this basename: CKTOTAL, CKMB, CKMBINDEX, TROPONINI,  in the last  168 hours BNP: BNP (last 3 results) No results found for this basename: PROBNP,  in the last 8760 hours CBG:  Recent Labs Lab 08/04/13 2008 08/05/13 0008 08/05/13 0427 08/05/13 0754 08/05/13 1115  GLUCAP 133* 130* 98 74 108*       Signed:  Angelita Harnack  [facility] 08/05/2013, 12:30 PM

## 2013-08-05 NOTE — Progress Notes (Signed)
Patient ID: [name], female   DOB: Apr 27, 1951, 63 y.o.   MRN: [id number]    Subjective: Pt feels great.  Tolerating a normal diet.  No pain.  Objective: Vital signs in last 24 hours: Temp:  [98 F (36.7 C)-99 F (37.2 C)] 98.8 F (37.1 C) (03/13 0757) Pulse Rate:  [71-115] 115 (03/13 0757) Resp:  [16-19] 18 (03/13 0757) BP: (83-139)/(40-77) 83/45 mmHg (03/13 0757) SpO2:  [95 %-100 %] 100 % (03/13 0757) Weight:  [110 lb 10.7 oz (50.2 kg)] 110 lb 10.7 oz (50.2 kg) (03/12 2011) Last BM Date: 08/04/13  Intake/Output from previous day: 03/12 0701 - 03/13 0700 In: 1316.3 [P.O.:1080; I.V.:236.3] Out: -  Intake/Output this shift: Total I/O In: 240 [P.O.:240] Out: -   PE: Abd: soft, NT, ND, +BS, ostomy working well  Lab Results:   Recent Labs  08/03/13 0703 08/04/13 0522  WBC 7.4 9.2  HGB 9.2* 9.2*  HCT 28.3* 28.0*  PLT 215 219   BMET  Recent Labs  08/03/13 0703 08/04/13 0522  NA 137 141  K 3.8 4.2  CL 101 106  CO2 27 23  GLUCOSE 79 116*  BUN 13 5*  CREATININE 1.15* 0.90  CALCIUM 8.5 8.4   PT/INR No results found for this basename: LABPROT, INR,  in the last 72 hours CMP     Component Value Date/Time   NA 141 08/04/2013 0522   K 4.2 08/04/2013 0522   CL 106 08/04/2013 0522   CO2 23 08/04/2013 0522   GLUCOSE 116* 08/04/2013 0522   BUN 5* 08/04/2013 0522   CREATININE 0.90 08/04/2013 0522   CREATININE 1.04 08/27/2012 1054   CALCIUM 8.4 08/04/2013 0522   CALCIUM 9.0 07/15/2011 0920   PROT 4.9* 08/03/2013 0703   ALBUMIN 2.0* 08/03/2013 0703   AST 9 08/03/2013 0703   ALT 7 08/03/2013 0703   ALKPHOS 56 08/03/2013 0703   BILITOT 0.3 08/03/2013 0703   GFRNONAA 67* 08/04/2013 0522   GFRAA 77* 08/04/2013 0522   Lipase     Component Value Date/Time   LIPASE 12 08/02/2013 0305       Studies/Results: No results found.  Anti-infectives: Anti-infectives   Start     Dose/Rate Route Frequency Ordered Stop   08/02/13 2200  piperacillin-tazobactam (ZOSYN) IVPB  3.375 g     3.375 g 12.5 mL/hr over 240 Minutes Intravenous Every 8 hours 08/02/13 1305     08/02/13 1330  piperacillin-tazobactam (ZOSYN) IVPB 3.375 g     3.375 g 12.5 mL/hr over 240 Minutes Intravenous  Once 08/02/13 1305 08/02/13 1822   08/02/13 1300  hydroxychloroquine (PLAQUENIL) tablet 200 mg     200 mg Oral 2 times daily 08/02/13 1201         Assessment/Plan  1. Intra-abdominal abscess 2. Crohn's disease 3. COPD  Plan: 1. Long term abx therapy for 3 weeks at home with oral Augmentin.  She is stable for dc home from our standpoint. 2. Right now, no definite follow up CT scan.  She will be followed clinically.  She had an appointment with Dr. Johna Sheriff already scheduled for 1-2 weeks from now.   LOS: 3 days    Edward Trevino E 08/05/2013, 9:57 AM Pager: [phone]

## 2013-08-10 ENCOUNTER — Emergency Department (HOSPITAL_COMMUNITY): Payer: Medicare Other

## 2013-08-10 ENCOUNTER — Emergency Department (HOSPITAL_COMMUNITY)
Admission: EM | Admit: 2013-08-10 | Discharge: 2013-08-11 | Disposition: A | Discharge status: Home / Self Care | Payer: Medicare Other | Attending: Emergency Medicine | Admitting: Emergency Medicine

## 2013-08-10 DIAGNOSIS — Z87448 Personal history of other diseases of urinary system: Secondary | ICD-10-CM | POA: Insufficient documentation

## 2013-08-10 DIAGNOSIS — Z932 Ileostomy status: Secondary | ICD-10-CM | POA: Insufficient documentation

## 2013-08-10 DIAGNOSIS — IMO0002 Reserved for concepts with insufficient information to code with codable children: Secondary | ICD-10-CM | POA: Insufficient documentation

## 2013-08-10 DIAGNOSIS — Z933 Colostomy status: Secondary | ICD-10-CM | POA: Insufficient documentation

## 2013-08-10 DIAGNOSIS — J449 Chronic obstructive pulmonary disease, unspecified: Secondary | ICD-10-CM | POA: Insufficient documentation

## 2013-08-10 DIAGNOSIS — M899 Disorder of bone, unspecified: Secondary | ICD-10-CM | POA: Insufficient documentation

## 2013-08-10 DIAGNOSIS — N731 Chronic parametritis and pelvic cellulitis: Secondary | ICD-10-CM | POA: Insufficient documentation

## 2013-08-10 DIAGNOSIS — R109 Unspecified abdominal pain: Secondary | ICD-10-CM

## 2013-08-10 DIAGNOSIS — Z792 Long term (current) use of antibiotics: Secondary | ICD-10-CM | POA: Insufficient documentation

## 2013-08-10 DIAGNOSIS — F411 Generalized anxiety disorder: Secondary | ICD-10-CM | POA: Insufficient documentation

## 2013-08-10 DIAGNOSIS — J4489 Other specified chronic obstructive pulmonary disease: Secondary | ICD-10-CM | POA: Insufficient documentation

## 2013-08-10 DIAGNOSIS — M949 Disorder of cartilage, unspecified: Secondary | ICD-10-CM

## 2013-08-10 DIAGNOSIS — Z8701 Personal history of pneumonia (recurrent): Secondary | ICD-10-CM | POA: Insufficient documentation

## 2013-08-10 DIAGNOSIS — Z87891 Personal history of nicotine dependence: Secondary | ICD-10-CM | POA: Insufficient documentation

## 2013-08-10 DIAGNOSIS — D649 Anemia, unspecified: Secondary | ICD-10-CM | POA: Insufficient documentation

## 2013-08-10 DIAGNOSIS — F3289 Other specified depressive episodes: Secondary | ICD-10-CM | POA: Insufficient documentation

## 2013-08-10 DIAGNOSIS — I10 Essential (primary) hypertension: Secondary | ICD-10-CM | POA: Insufficient documentation

## 2013-08-10 DIAGNOSIS — E119 Type 2 diabetes mellitus without complications: Secondary | ICD-10-CM | POA: Insufficient documentation

## 2013-08-10 DIAGNOSIS — Z79899 Other long term (current) drug therapy: Secondary | ICD-10-CM | POA: Insufficient documentation

## 2013-08-10 DIAGNOSIS — F329 Major depressive disorder, single episode, unspecified: Secondary | ICD-10-CM | POA: Insufficient documentation

## 2013-08-10 DIAGNOSIS — Z9889 Other specified postprocedural states: Secondary | ICD-10-CM | POA: Insufficient documentation

## 2013-08-10 DIAGNOSIS — R000 Tachycardia, unspecified: Secondary | ICD-10-CM | POA: Insufficient documentation

## 2013-08-10 DIAGNOSIS — E785 Hyperlipidemia, unspecified: Secondary | ICD-10-CM | POA: Insufficient documentation

## 2013-08-10 DIAGNOSIS — Z9089 Acquired absence of other organs: Secondary | ICD-10-CM | POA: Insufficient documentation

## 2013-08-10 DIAGNOSIS — K219 Gastro-esophageal reflux disease without esophagitis: Secondary | ICD-10-CM | POA: Insufficient documentation

## 2013-08-10 DIAGNOSIS — Z9981 Dependence on supplemental oxygen: Secondary | ICD-10-CM | POA: Insufficient documentation

## 2013-08-10 DIAGNOSIS — M069 Rheumatoid arthritis, unspecified: Secondary | ICD-10-CM | POA: Insufficient documentation

## 2013-08-10 DIAGNOSIS — N739 Female pelvic inflammatory disease, unspecified: Secondary | ICD-10-CM

## 2013-08-10 LAB — URINALYSIS, ROUTINE W REFLEX MICROSCOPIC
Bilirubin Urine: NEGATIVE
Glucose, UA: NEGATIVE mg/dL
Hgb urine dipstick: NEGATIVE
KETONES UR: 15 mg/dL — AB
Nitrite: NEGATIVE
Protein, ur: 30 mg/dL — AB
SPECIFIC GRAVITY, URINE: 1.029 (ref 1.005–1.030)
UROBILINOGEN UA: 0.2 mg/dL (ref 0.0–1.0)
pH: 5.5 (ref 5.0–8.0)

## 2013-08-10 LAB — COMPREHENSIVE METABOLIC PANEL
ALBUMIN: 3 g/dL — AB (ref 3.5–5.2)
ALK PHOS: 68 U/L (ref 39–117)
ALT: 23 U/L (ref 0–35)
AST: 13 U/L (ref 0–37)
BUN: 23 mg/dL (ref 6–23)
CO2: 26 mEq/L (ref 19–32)
Calcium: 9.5 mg/dL (ref 8.4–10.5)
Chloride: 101 mEq/L (ref 96–112)
Creatinine, Ser: 0.91 mg/dL (ref 0.50–1.10)
GFR calc Af Amer: 76 mL/min — ABNORMAL LOW (ref >90)
GFR calc non Af Amer: 66 mL/min — ABNORMAL LOW (ref >90)
Glucose, Bld: 116 mg/dL — ABNORMAL HIGH (ref 70–99)
POTASSIUM: 3.9 meq/L (ref 3.7–5.3)
Sodium: 139 mEq/L (ref 137–147)
TOTAL PROTEIN: 6.3 g/dL (ref 6.0–8.3)
Total Bilirubin: 0.2 mg/dL — ABNORMAL LOW (ref 0.3–1.2)

## 2013-08-10 LAB — CBC WITH DIFFERENTIAL/PLATELET
BASOS PCT: 0 % (ref 0–1)
Basophils Absolute: 0 10*3/uL (ref 0.0–0.1)
EOS PCT: 0 % (ref 0–5)
Eosinophils Absolute: 0 10*3/uL (ref 0.0–0.7)
HCT: 35.9 % — ABNORMAL LOW (ref 36.0–46.0)
HEMOGLOBIN: 11.9 g/dL — AB (ref 12.0–15.0)
Lymphocytes Relative: 11 % — ABNORMAL LOW (ref 12–46)
Lymphs Abs: 1.4 10*3/uL (ref 0.7–4.0)
MCH: 28.3 pg (ref 26.0–34.0)
MCHC: 33.1 g/dL (ref 30.0–36.0)
MCV: 85.3 fL (ref 78.0–100.0)
MONOS PCT: 4 % (ref 3–12)
Monocytes Absolute: 0.5 10*3/uL (ref 0.1–1.0)
NEUTROS PCT: 85 % — AB (ref 43–77)
Neutro Abs: 10.5 10*3/uL — ABNORMAL HIGH (ref 1.7–7.7)
PLATELETS: 382 10*3/uL (ref 150–400)
RBC: 4.21 MIL/uL (ref 3.87–5.11)
RDW: 14.5 % (ref 11.5–15.5)
WBC: 12.3 10*3/uL — ABNORMAL HIGH (ref 4.0–10.5)

## 2013-08-10 LAB — URINE MICROSCOPIC-ADD ON

## 2013-08-10 LAB — LIPASE, BLOOD: Lipase: 43 U/L (ref 11–59)

## 2013-08-10 LAB — I-STAT CG4 LACTIC ACID, ED: Lactic Acid, Venous: 0.92 mmol/L (ref 0.5–2.2)

## 2013-08-10 MED ORDER — HYDROMORPHONE HCL PF 1 MG/ML IJ SOLN
1.0000 mg | Freq: Once | INTRAMUSCULAR | Status: AC
  Administered 2013-08-10: 1 mg via INTRAVENOUS
  Filled 2013-08-10: qty 1

## 2013-08-10 MED ORDER — OXYCODONE-ACETAMINOPHEN 5-325 MG PO TABS: 1.0000 | ORAL_TABLET | Freq: Four times a day (QID) | ORAL | Status: DC | PRN

## 2013-08-10 MED ORDER — FENTANYL CITRATE 0.05 MG/ML IJ SOLN
50.0000 ug | Freq: Once | INTRAMUSCULAR | Status: AC
  Administered 2013-08-10: 50 ug via INTRAVENOUS
  Filled 2013-08-10: qty 2

## 2013-08-10 MED ORDER — SODIUM CHLORIDE 0.9 % IV BOLUS (SEPSIS)
1000.0000 mL | Freq: Once | INTRAVENOUS | Status: AC
  Administered 2013-08-10: 1000 mL via INTRAVENOUS

## 2013-08-10 MED ORDER — ONDANSETRON HCL 4 MG/2ML IJ SOLN
4.0000 mg | Freq: Once | INTRAMUSCULAR | Status: AC
  Administered 2013-08-10: 4 mg via INTRAVENOUS
  Filled 2013-08-10: qty 2

## 2013-08-10 NOTE — Discharge Instructions (Signed)
Your pain is related to a pelvic abscess that will require continuous antibiotic use and close follow up with your surgeon and your doctor.  You may take 1-2 percocet for pain as needed.  Do not take it with any other medications containing tylenol.  Return if you develop fever, active vomiting, worsening pain or if you have other concerns.  Follow up with your doctor closely.

## 2013-08-10 NOTE — ED Notes (Signed)
Pt called out stating that her pain is coming back.

## 2013-08-10 NOTE — ED Notes (Addendum)
Pt reports increased lower abdominal pain and tenderness x last night. Mild nausea last night.  Pt seen here on 3/10; dx with pelvic abscess and started on steroids and pain rx. Appt with PCP, but states "pain is so bad I had to come today." Hx: Crohn's, colostomy, diabetes

## 2013-08-10 NOTE — ED Provider Notes (Signed)
CSN: [id number]     Arrival date & time 08/10/13  1451 History   First MD Initiated Contact with Patient 08/10/13 1907     Chief Complaint  Patient presents with  . Abdominal Pain     (Consider location/radiation/quality/duration/timing/severity/associated sxs/prior Treatment) HPI This 63 year old female with past medical history of Crohn's disease, status post colectomy and ileostomy in 2007, complicated by multiple intra-abdominal abscess who was admitted to general medicine on 08-02-2013. CT scan of the abdomen and pelvis performed on 07-28-2013 showing persistent loculated mid pelvic abscess. Given that the abscess is multi-septated, IR drainage would be difficult. Surgery recommend long-term broad spectrum antibiotic therapy as surgery is high risk. Patient was discharged with Ceftin on 08/05/2013.  Patient reports her pain was well-controlled while hospitalized, where she was discharge she was prescribed Vicodin and Ultram. States he took the medication how it did not provide adequate pain control. Continues to endorse persistent generalized abdominal pain worse in the left pelvic region. Pain is described as a vomiting, stabbing and twisting sensation, persistent, worsening with palpation. She is afraid to eat and drink inferior that it may worsen her pain. She hasn't been eating or drinking for the past day. She denies fever, chills, chest pain or shortness of breath. Denies any productive cough. She has a ostomy bag that does put out some liquid output. She has no other complaints.     Past Medical History  Diagnosis Date  . Other diseases of vocal cords   . Osteoporosis, unspecified   . Other and unspecified hyperlipidemia   . Esophageal reflux   . Chronic airway obstruction, not elsewhere classified   . Regional enteritis of unspecified site   . 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 Crohn's disease     Ileostomy  . Pelvic adhesions   . Anxiety   . Depression    . Hypertension   . Emphysema   . Hematuria   . Severe cervical dysplasia age 43    cone biopsy with neg paps since  . Pneumonia     "3-4 times; necrotizing once" (04/19/2013)  . Asthmatic bronchitis , chronic   . Exertional shortness of breath   . On home oxygen therapy     "24/7; 2.5L @ rest; 3L when I'm doing my chores" (08/02/2013)  . Type II diabetes mellitus   . Anemia   . History of blood transfusion     "I've had 3 or 4" (04/19/2013)  . Rheumatoid arthritis(714.0) 03/2012    [place]; "mild in my hands" (04/19/2013)  . Small bowel obstruction due to adhesions 2014    hoxworth  . Pelvic abscess in female 2014   Past Surgical History  Procedure Laterality Date  . Ileostomy  2007  . Ear cyst excision Bilateral 1970's    "lobes"   . Cervical cone biopsy  1978  . Colon surgery  2007    subtotal colectomy with ileostomy  . Esophagogastroduodenoscopy (egd) with esophageal dilation      "3 times, I think" (04/19/2013)  . Oophorectomy      LSO  . Tracheostomy  2010  . Hammer toe surgery Bilateral 1980's  . Balloon dilation  02/10/2012    Procedure: BALLOON DILATION;  Surgeon: Charolett Bumpers, MD;  Location: [facility];  Service: Endoscopy;  Laterality: N/A;  . Colposcopy    . Flexible sigmoidoscopy  03/26/2012    Procedure: FLEXIBLE SIGMOIDOSCOPY;  Surgeon: Charolett Bumpers, MD;  Location: [facility];  Service: Endoscopy;  Laterality:  N/A;  . Cardiac catheterization  07/08/2007    normal  . Nm myocar perf wall motion  09/21/2006    no ischemia  . Ventral hernia repair N/A 03/16/2013    Procedure: REPAIR OF INCISIONAL AND PARASTOMAL HERNIAS;  Surgeon: Mariella SaaBenjamin T Hoxworth, MD;  Location: [facility] OR;  Service: General;  Laterality: N/A;  . Laparotomy N/A 03/16/2013    Procedure: EXPLORATORY LAPAROTOMY;  Surgeon: Mariella SaaBenjamin T Hoxworth, MD;  [facility];  Service: General;  Laterality: N/A;  . Lysis of adhesion N/A 03/16/2013    Procedure: LYSIS OF ADHESIONS FOR SMALL BOWEL  OBSTRUCTION;  Surgeon: Mariella SaaBenjamin T Hoxworth, MD;  Location: [facility] OR;  Service: General;  Laterality: N/A;  . Insertion of mesh N/A 03/16/2013    Procedure: INSERTION OF BIOLOGIC MESH;  Surgeon: Mariella SaaBenjamin T Hoxworth, MD;  Location: [facility] OR;  Service: General;  Laterality: N/A;  . Tee without cardioversion N/A 03/21/2013    Procedure: TRANSESOPHAGEAL ECHOCARDIOGRAM (TEE);  Surgeon: Vesta MixerPhilip J Nahser, MD;  [facility];  Service: Cardiovascular;  Laterality: N/A;  . Vaginal hysterectomy  1993    [facility]-LSO  . Tracheostomy closure  2010  . Hernia repair     Family History  Problem Relation Age of Onset  . Osteoporosis Mother   . Kidney disease Mother   . Hypertension Sister   . Stroke Sister   . Diabetes Sister    History  Substance Use Topics  . Smoking status: Former Smoker -- 1.00 packs/day for 32 years    Types: Cigarettes    Quit date: 05/26/2004  . Smokeless tobacco: Never Used  . Alcohol Use: 0.0 oz/week     Comment: 08/02/2013 "have a drink a couple times/yr"   OB History   Grav Para Term Preterm Abortions TAB SAB Ect Mult Living   3 0   3     0     Review of Systems  All other systems reviewed and are negative.      Allergies  Esomeprazole magnesium; Shellfish allergy; Surgical lubricant; Other; Peanut-containing drug products; Sulfate; and Levaquin  Home Medications   Current Outpatient Rx  Name  Route  Sig  Dispense  Refill  . Amino Acids-Protein Hydrolys (FEEDING SUPPLEMENT, PRO-STAT SUGAR FREE 64,) LIQD   Oral   Take 30 mLs by mouth daily.   900 mL   0   . Amino Acids-Protein Hydrolys (FEEDING SUPPLEMENT, PRO-STAT SUGAR FREE 64,) LIQD   Oral   Take 30 mLs by mouth daily.   900 mL   0   . arformoterol (BROVANA) 15 MCG/2ML NEBU   Nebulization   Take 15 mcg by nebulization 2 (two) times daily.         [name]. aspirin 81 MG tablet   Oral   Take 81 mg by mouth every Monday, Wednesday, and Friday.          . budesonide (PULMICORT) 0.25 MG/2ML  nebulizer solution   Nebulization   Take 0.25 mg by nebulization 2 (two) times daily.         . calcium-vitamin D (OSCAL WITH D) 500-200 MG-UNIT per tablet   Oral   Take 1 tablet by mouth 2 (two) times daily.         . cefUROXime (CEFTIN) 500 MG tablet   Oral   Take 1 tablet (500 mg total) by mouth 2 (two) times daily with a meal.   42 tablet   0   . Cholecalciferol (VITAMIN D) 1000 UNITS capsule  Oral   Take 1,000 Units by mouth daily.          . citalopram (CELEXA) 20 MG tablet   Oral   Take 20 mg by mouth daily.          . famotidine (PEPCID) 20 MG tablet   Oral   Take 20 mg by mouth 2 (two) times daily.         . fluticasone (FLONASE) 50 MCG/ACT nasal spray   Nasal   Place 2 sprays into the nose daily as needed.          [name] guaiFENesin (MUCINEX) 600 MG 12 hr tablet   Oral   Take 1,200 mg by mouth 2 (two) times daily.         [name] HYDROcodone-acetaminophen (NORCO/VICODIN) 5-325 MG per tablet   Oral   Take 1-2 tablets by mouth every 6 (six) hours as needed for moderate pain or severe pain.   15 tablet   0   . hydroxychloroquine (PLAQUENIL) 200 MG tablet   Oral   Take 200 mg by mouth Twice daily.          . IRON PO   Oral   Take 1 tablet by mouth every Monday, Wednesday, and Friday.          [name] LORazepam (ATIVAN) 0.5 MG tablet      TAKE 1 TABLET BY MOUTH EVERY 8 HOURS AS NEEDED   60 tablet   0   . metoCLOPramide (REGLAN) 10 MG tablet   Oral   Take 1 tablet (10 mg total) by mouth 3 (three) times daily before meals.   90 tablet   1   . omeprazole (PRILOSEC) 40 MG capsule   Oral   Take 40 mg by mouth 2 (two) times daily.          . ondansetron (ZOFRAN) 4 MG tablet   Oral   Take 1 tablet (4 mg total) by mouth every 6 (six) hours as needed for nausea.   20 tablet   0   . predniSONE (DELTASONE) 20 MG tablet   Oral   Take 2 tablets (40 mg total) by mouth daily with breakfast.   60 tablet   0   . PROAIR HFA 108 (90 BASE) MCG/ACT  inhaler   Inhalation   Inhale 2 puffs into the lungs every 6 (six) hours as needed for wheezing or shortness of breath.          [name] Respiratory Therapy Supplies (FLUTTER) DEVI   Inhalation   Inhale into the lungs. 2-4 times daily         . rosuvastatin (CRESTOR) 5 MG tablet   Oral   Take 5 mg by mouth every Monday, Wednesday, and Friday.          . tiotropium (SPIRIVA) 18 MCG inhalation capsule   Inhalation   Place 1 capsule (18 mcg total) into inhaler and inhale daily.   30 capsule   5   . traMADol (ULTRAM) 50 MG tablet   Oral   Take 1 tablet (50 mg total) by mouth every 6 (six) hours as needed for moderate pain or severe pain.   60 tablet   0    BP 138/79  Pulse 93  Temp(Src) 98.8 F (37.1 C) (Oral)  Resp 16  SpO2 100% Physical Exam  Nursing note and vitals reviewed. Constitutional: She is oriented to person, place, and time. She appears well-developed and well-nourished. No distress.  Thin-appearing female appears  to be in mild distress  HENT:  Head: Atraumatic.  Eyes: Conjunctivae are normal.  Neck: Neck supple.  Cardiovascular:  Tachycardia without murmurs rubs or gallops  Pulmonary/Chest: Effort normal and breath sounds normal. No respiratory distress. She has no wheezes. She has no rales.  Abdominal: Soft. There is tenderness (Diffuse abdominal tenderness most significant to left lower quadrant. Ostomy bag with normal stoma. No guarding or rebound tenderness.).  Neurological: She is alert and oriented to person, place, and time.  Skin: No rash noted.  Psychiatric: She has a normal mood and affect.    ED Course  Procedures (including critical care time)  7:40 PM Patient presents with low abdominal pain. This pain is related to a pelvic abscess that had been diagnosed on CT scan and surgery is aware. Patient is not a surgical candidate. The plan was to have patient taking broad-spectrum antibiotic for an extended period of time. She is currently on  Ceftin.  11:40 PM Patient has been monitored and pain control. She feels much better after receiving pain medication. She is able to tolerates by mouth. She has no lactic acid, UA without evidence of UTI, mildly elevated WBC of 12.3. CMP is reassuring, normal lipase, acute abdominal series with similar appearance of the chest and abdomen with dilated small bowel in the pelvis possibly related to subtotal colectomy and ileostomy. No evidence of small bowel structure or perforation. Patient does have close followup with her doctor next week. Plan to increase her pain medication from Vicodin to Percocet and for patient to have close followup with her doctor. Return precautions discussed. Patient voiced understanding and agrees with plan.  Pt will continue with her abx.  Care discussed with Dr. Jodi Mourning  Labs Review Labs Reviewed  CBC WITH DIFFERENTIAL - Abnormal; Notable for the following:    WBC 12.3 (*)    Hemoglobin 11.9 (*)    HCT 35.9 (*)    Neutrophils Relative % 85 (*)    Neutro Abs 10.5 (*)    Lymphocytes Relative 11 (*)    All other components within normal limits  COMPREHENSIVE METABOLIC PANEL - Abnormal; Notable for the following:    Glucose, Bld 116 (*)    Albumin 3.0 (*)    Total Bilirubin 0.2 (*)    GFR calc non Af Amer 66 (*)    GFR calc Af Amer 76 (*)    All other components within normal limits  URINALYSIS, ROUTINE W REFLEX MICROSCOPIC - Abnormal; Notable for the following:    APPearance HAZY (*)    Ketones, ur 15 (*)    Protein, ur 30 (*)    Leukocytes, UA SMALL (*)    All other components within normal limits  URINE MICROSCOPIC-ADD ON - Abnormal; Notable for the following:    Squamous Epithelial / LPF FEW (*)    Casts HYALINE CASTS (*)    Crystals CA OXALATE CRYSTALS (*)    All other components within normal limits  LIPASE, BLOOD  I-STAT CG4 LACTIC ACID, ED   Imaging Review Dg Abd Acute W/chest  08/10/2013   CLINICAL DATA:  Left-sided abdominal pain. History of  Crohn's disease.  EXAM: ACUTE ABDOMEN SERIES (ABDOMEN 2 VIEW & CHEST 1 VIEW)  COMPARISON:  DG ABD ACUTE W/CHEST dated 08/02/2013; CT ABD/PELVIS W CM dated 07/28/2013  FINDINGS: The heart size and mediastinal contours are stable. The lungs are clear aside from mild left basilar scarring which appears stable. There is probable mild underlying emphysema. 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  There is stable dilated bowel in the left lower quadrant. Right lower quadrant ostomy is noted. There is no free intraperitoneal air. Embolization coils are present within the upper abdomen.  IMPRESSION: Similar appearance of the chest and abdomen with dilated small bowel in the pelvis, possibly related to subtotal colectomy and ileostomy.   Electronically Signed   By: Roxy Horseman M.D.   On: 08/10/2013 21:12     EKG Interpretation None      MDM   Final diagnoses:  Pelvic abscess in female  Abdominal pain    BP 146/91  Pulse 91  Temp(Src) 98.8 F (37.1 C) (Oral)  Resp 20  SpO2 100%  I have reviewed nursing notes and vital signs. I personally reviewed the imaging tests through PACS system  I reviewed available ER/hospitalization records thought the EMR     Fayrene Helper, [place] 08/10/13 2348

## 2013-08-11 ENCOUNTER — Ambulatory Visit (INDEPENDENT_AMBULATORY_CARE_PROVIDER_SITE_OTHER): Payer: Medicare Other | Admitting: Critical Care Medicine

## 2013-08-11 ENCOUNTER — Encounter: Payer: Self-pay | Admitting: Critical Care Medicine

## 2013-08-11 VITALS — BP 128/74 | HR 101 | Temp 96.2°F | Ht 61.0 in | Wt 106.6 lb

## 2013-08-11 DIAGNOSIS — Z515 Encounter for palliative care: Secondary | ICD-10-CM

## 2013-08-11 DIAGNOSIS — Z66 Do not resuscitate: Secondary | ICD-10-CM

## 2013-08-11 DIAGNOSIS — N731 Chronic parametritis and pelvic cellulitis: Secondary | ICD-10-CM

## 2013-08-11 DIAGNOSIS — J9610 Chronic respiratory failure, unspecified whether with hypoxia or hypercapnia: Secondary | ICD-10-CM

## 2013-08-11 DIAGNOSIS — J439 Emphysema, unspecified: Secondary | ICD-10-CM

## 2013-08-11 DIAGNOSIS — N739 Female pelvic inflammatory disease, unspecified: Secondary | ICD-10-CM

## 2013-08-11 DIAGNOSIS — J438 Other emphysema: Secondary | ICD-10-CM

## 2013-08-11 DIAGNOSIS — M069 Rheumatoid arthritis, unspecified: Secondary | ICD-10-CM

## 2013-08-11 DIAGNOSIS — E43 Unspecified severe protein-calorie malnutrition: Secondary | ICD-10-CM

## 2013-08-11 NOTE — Assessment & Plan Note (Signed)
endstage copd stable Plan Cont curr inhaled meds

## 2013-08-11 NOTE — Assessment & Plan Note (Signed)
Severe ongoing prot cal malnutrition Plan  Cont curr Rx

## 2013-08-11 NOTE — Progress Notes (Signed)
Subjective:    Patient ID: [name], female    DOB: [birth year], 63 y.o.   MRN: [id number]  HPI  63 y.o.[name]  woman with severe COPD Golds Stage D   CAT Score 01/19/2013 07/07/2012 05/11/2012 04/27/2012  Total CAT Score 19 21 14 24    06/15/2013 Chief Complaint  Patient presents with  . Follow-up    Pred was sent in on Jan 12.   Reports breathing is doing much better since this.  Does cough with clear mucus.  No wheezing or chest tightness/pain.  Would like to discuss lorazepam increase.  Pt in and out of the hosp 5 x in 2 months.  Pt last in hosp 12/19. Pt kept getting bowel obstructions and two abscesses.  Protein cal malnutritions.  Made major changes in diet, less oxygen diet.  Scar tissue led to bowel obstruction, from prior colon surgeries, pt has a ostomy since 2007  On last  Office note surgery:   She has a history of Crohn's disease status post total colectomy with ileostomy and rectal pouch done in [place] a number of years ago. She also has severe chronic respiratory disease. On home oxygen. She presented with small bowel obstruction and I operated on her in November with a complete obstruction due to pelvic adhesion down to her rectal stump. Also repaired incisional and parastomal hernias at that time. 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 This was treated with percutaneous drainage and she again improved and was discharged but was readmitted with ongoing abdominal pain and food intolerance and on upper GI series was found to have a partial small bowel obstruction. The abscess was resolving by CT scan. This was in early December. She was started on TNA and was discharged on December 19 on home TNA. and oral antibiotics and fluids only by mouth. She states that over the last 3 weeks she has been feeling  significantly better. She has felt hungry and has experimented on a few occasions with a small amount of solid food and tolerated this well. She currently denies any abdominal pain or nausea or fever. Ileostomy is working well.    Pt had two surgeries and a drain by IR, and ABX and TPN given and still gets TPN. PICC line. OnTPN during the day, also eats soft foods Now on 2.5L of oxygen 24/7 Pt with [employer] network Just finishing pred pulse No wheeze , sl mucus in the AM. Pt denies any significant sore throat, nasal congestion or excess secretions, fever, chills, sweats, unintended weight loss, pleurtic or exertional chest pain, orthopnea PND, or leg swelling Pt denies any increase in rescue therapy over baseline, denies waking up needing it or having any early am or nocturnal exacerbations of coughing/wheezing/or dyspnea. Pt also denies any obvious fluctuation in symptoms with  weather or environmental change or other alleviating or aggravating factors  08/11/2013 Chief Complaint  Patient presents with  . 2 month follow up    Was in the ED last night d/t pelvic abcess.  Breathing is doing "remarkably well" at this time.  SOB is at baseline.  No wheezing, chest tightness/pain, or cough at this time.   Pt keeps getting pelvic abscesses, crohns dz.  Getting dilaudid and helps.  In and out of hosp/ED 9 x in 6 months Lost weight 21#.  No mucus  or resp issues  C/o severe pain  Pt denies any significant sore throat, nasal congestion or excess secretions, fever, chills, sweats, unintended weight loss, pleurtic or exertional chest pain, orthopnea PND, or leg swelling Pt denies any increase in rescue therapy over baseline, denies waking up needing it or having any early am or nocturnal exacerbations of coughing/wheezing/or dyspnea. Pt also denies any obvious fluctuation in symptoms with  weather or environmental change or other alleviating or aggravating factors   Review of Systems  See hpi       Objective:   Physical Exam   Filed Vitals:   08/11/13 0904  BP: 128/74  Pulse: 101  Temp: 96.2 F (35.7 C)  TempSrc: Oral  Height: 5\' 1"  (1.549 m)  Weight: 106 lb 9.6 oz (48.353 kg)  SpO2: 100%    Gen: Pleasant, well-nourished, in no distress,  normal affect  ENT: No lesions,  mouth clear,  oropharynx clear, no postnasal drip  Neck: No JVD, no TMG, no carotid bruits  Lungs: No use of accessory muscles, no dullness to percussion, distant BS  Cardiovascular: RRR, heart sounds normal, no murmur or gallops, no peripheral edema,   Abdomen: soft and tender LQ, no HSM,  BS normal, ileostomy in place   Musculoskeletal: No deformities, no cyanosis or clubbing  Neuro: alert, non focal  Skin: Warm, no lesions or rashes      Assessment & Plan:   Hospice care Long discussion with the patient .She agrees to hospice care. 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 COPD with emphysematous and asthmatic bronchitic component endstage copd stable Plan Cont curr inhaled meds    Updated Medication List Outpatient Encounter Prescriptions as of 08/11/2013  Medication Sig  . Amino Acids-Protein Hydrolys (FEEDING SUPPLEMENT, PRO-STAT SUGAR FREE 64,) LIQD Take 30 mLs by mouth daily.  08/13/2013 arformoterol (BROVANA) 15 MCG/2ML NEBU Take 15 mcg by nebulization 2 (two) times daily.  [name] aspirin 81 MG tablet Take 81 mg by mouth every Monday, Wednesday, and Friday.   . budesonide (PULMICORT) 0.25 MG/2ML nebulizer solution Take 0.25 mg by  nebulization 2 (two) times daily.  . calcium-vitamin D (OSCAL WITH D) 500-200 MG-UNIT per tablet Take 1 tablet by mouth 2 (two) times daily.  . cefUROXime (CEFTIN) 500 MG tablet Take 1 tablet (500 mg total) by mouth 2 (two) times daily with a meal.  . Cholecalciferol (VITAMIN D) 1000 UNITS capsule Take 1,000 Units by mouth daily.   . citalopram (CELEXA) 20 MG tablet Take 20 mg by mouth daily.   . famotidine (PEPCID) 20 MG tablet Take 20 mg by mouth 2 (two) times daily.  . fluticasone (FLONASE) 50 MCG/ACT nasal spray Place 2 sprays into the nose daily as needed.   Tuesday guaiFENesin (MUCINEX) 600 MG 12 hr tablet Take 1,200 mg by mouth 2 (two) times daily.  [name] HYDROcodone-acetaminophen (NORCO/VICODIN) 5-325 MG per tablet as needed.  . IRON PO Take 1 tablet by mouth every Monday, Wednesday, and Friday.   Thursday LORazepam (ATIVAN) 0.5 MG tablet TAKE 1 TABLET BY MOUTH EVERY 8 HOURS AS NEEDED  .  metoCLOPramide (REGLAN) 10 MG tablet Take 1 tablet (10 mg total) by mouth 3 (three) times daily before meals.  [name] omeprazole (PRILOSEC) 40 MG capsule Take 40 mg by mouth 2 (two) times daily.   . ondansetron (ZOFRAN) 4 MG tablet Take 1 tablet (4 mg total) by mouth every 6 (six) hours as needed for nausea.  [name] oxyCODONE-acetaminophen (PERCOCET/ROXICET) 5-325 MG per tablet Take 1-2 tablets by mouth every 6 (six) hours as needed for severe pain.  . predniSONE (DELTASONE) 20 MG tablet Take 2 tablets (40 mg total) by mouth daily with breakfast.  . PROAIR HFA 108 (90 BASE) MCG/ACT inhaler Inhale 2 puffs into the lungs every 6 (six) hours as needed for wheezing or shortness of breath.   [name] Respiratory Therapy Supplies (FLUTTER) DEVI Inhale into the lungs. 2-4 times daily  . rosuvastatin (CRESTOR) 5 MG tablet Take 5 mg by mouth every Monday, Wednesday, and Friday.   . tiotropium (SPIRIVA) 18 MCG inhalation capsule Place 1 capsule (18 mcg total) into inhaler and inhale daily.  . traMADol (ULTRAM) 50 MG tablet as needed.  .  [DISCONTINUED] Amino Acids-Protein Hydrolys (FEEDING SUPPLEMENT, PRO-STAT SUGAR FREE 64,) LIQD Take 30 mLs by mouth daily.  . [DISCONTINUED] hydroxychloroquine (PLAQUENIL) 200 MG tablet Take 200 mg by mouth Twice daily.

## 2013-08-11 NOTE — ED Provider Notes (Signed)
Medical screening examination/treatment/procedure(s) were conducted as a shared visit with non-physician practitioner(s) or resident and myself. I personally evaluated the patient during the encounter and agree with the findings and plan unless otherwise indicated.  I have personally reviewed any xrays and/ or EKG's with the provider and I agree with interpretation.  Crohn's. Multiple surgery hx including colectomy/ SBO/ abscesses. Abdo pain similar to previous, pt feels pain medicines do not control her pain. No fevers or systemic sxs.  Pt taking po abx as directed and has fup tomorrow.   Exam central and LLQ pain, no guarding, colostomy in place, mild dry mm.  Pt pain improved significantly in ED, no vomiting, well appearing, lungs clear.  Very close fup outpt and discussed strict reasons to return.  Pt comfortable with holding on repeat CT at this time but she understands she may require if worsening sxs or fevers.  Filed Vitals:   08/10/13 2115 08/10/13 2130 08/10/13 2351 08/11/13 0046  BP: 132/61 146/91 102/82 144/66  Pulse: 89 91 112 93  Temp:      TempSrc:      Resp:      SpO2: 100% 100%  100%    Abdominal pain, pelvic abscess   Enid Skeens, MD 08/11/13 [phone]

## 2013-08-11 NOTE — Assessment & Plan Note (Signed)
Ongoing pelvic abscess Not drainable either with IR or per surgery Plan Po abx to continue Pain control

## 2013-08-11 NOTE — Patient Instructions (Signed)
A referral to hospice will be made Stop hydroxychloroquine Return 2 months

## 2013-08-11 NOTE — Assessment & Plan Note (Signed)
Ongoing RA but immunosuprressed  D/c plaquenil

## 2013-08-11 NOTE — Assessment & Plan Note (Signed)
Long discussion with the patient .She agrees to hospice care. 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

## 2013-08-12 ENCOUNTER — Encounter (HOSPITAL_COMMUNITY): Payer: Self-pay | Admitting: Emergency Medicine

## 2013-08-12 ENCOUNTER — Emergency Department (HOSPITAL_COMMUNITY)
Admission: EM | Admit: 2013-08-12 | Discharge: 2013-08-12 | Disposition: A | Discharge status: Home / Self Care | Attending: Emergency Medicine | Admitting: Emergency Medicine

## 2013-08-12 ENCOUNTER — Telehealth: Payer: Self-pay | Admitting: Critical Care Medicine

## 2013-08-12 DIAGNOSIS — M069 Rheumatoid arthritis, unspecified: Secondary | ICD-10-CM | POA: Insufficient documentation

## 2013-08-12 DIAGNOSIS — F329 Major depressive disorder, single episode, unspecified: Secondary | ICD-10-CM | POA: Insufficient documentation

## 2013-08-12 DIAGNOSIS — Z515 Encounter for palliative care: Secondary | ICD-10-CM | POA: Insufficient documentation

## 2013-08-12 DIAGNOSIS — F3289 Other specified depressive episodes: Secondary | ICD-10-CM | POA: Insufficient documentation

## 2013-08-12 DIAGNOSIS — E785 Hyperlipidemia, unspecified: Secondary | ICD-10-CM | POA: Insufficient documentation

## 2013-08-12 DIAGNOSIS — R112 Nausea with vomiting, unspecified: Secondary | ICD-10-CM | POA: Insufficient documentation

## 2013-08-12 DIAGNOSIS — IMO0002 Reserved for concepts with insufficient information to code with codable children: Secondary | ICD-10-CM | POA: Insufficient documentation

## 2013-08-12 DIAGNOSIS — D649 Anemia, unspecified: Secondary | ICD-10-CM | POA: Insufficient documentation

## 2013-08-12 DIAGNOSIS — Z7982 Long term (current) use of aspirin: Secondary | ICD-10-CM | POA: Insufficient documentation

## 2013-08-12 DIAGNOSIS — I10 Essential (primary) hypertension: Secondary | ICD-10-CM | POA: Insufficient documentation

## 2013-08-12 DIAGNOSIS — Z9981 Dependence on supplemental oxygen: Secondary | ICD-10-CM | POA: Insufficient documentation

## 2013-08-12 DIAGNOSIS — R109 Unspecified abdominal pain: Secondary | ICD-10-CM

## 2013-08-12 DIAGNOSIS — Z9889 Other specified postprocedural states: Secondary | ICD-10-CM | POA: Insufficient documentation

## 2013-08-12 DIAGNOSIS — F411 Generalized anxiety disorder: Secondary | ICD-10-CM | POA: Insufficient documentation

## 2013-08-12 DIAGNOSIS — G8929 Other chronic pain: Secondary | ICD-10-CM | POA: Insufficient documentation

## 2013-08-12 DIAGNOSIS — Z8701 Personal history of pneumonia (recurrent): Secondary | ICD-10-CM | POA: Insufficient documentation

## 2013-08-12 DIAGNOSIS — Z79899 Other long term (current) drug therapy: Secondary | ICD-10-CM | POA: Insufficient documentation

## 2013-08-12 DIAGNOSIS — J4489 Other specified chronic obstructive pulmonary disease: Secondary | ICD-10-CM | POA: Insufficient documentation

## 2013-08-12 DIAGNOSIS — K219 Gastro-esophageal reflux disease without esophagitis: Secondary | ICD-10-CM | POA: Insufficient documentation

## 2013-08-12 DIAGNOSIS — R1032 Left lower quadrant pain: Secondary | ICD-10-CM | POA: Insufficient documentation

## 2013-08-12 DIAGNOSIS — E119 Type 2 diabetes mellitus without complications: Secondary | ICD-10-CM | POA: Insufficient documentation

## 2013-08-12 DIAGNOSIS — Z87891 Personal history of nicotine dependence: Secondary | ICD-10-CM | POA: Insufficient documentation

## 2013-08-12 DIAGNOSIS — Z8739 Personal history of other diseases of the musculoskeletal system and connective tissue: Secondary | ICD-10-CM | POA: Insufficient documentation

## 2013-08-12 DIAGNOSIS — N731 Chronic parametritis and pelvic cellulitis: Secondary | ICD-10-CM | POA: Insufficient documentation

## 2013-08-12 DIAGNOSIS — J449 Chronic obstructive pulmonary disease, unspecified: Secondary | ICD-10-CM | POA: Insufficient documentation

## 2013-08-12 MED ORDER — ONDANSETRON HCL 4 MG/2ML IJ SOLN
4.0000 mg | Freq: Once | INTRAMUSCULAR | Status: AC
  Administered 2013-08-12: 4 mg via INTRAVENOUS
  Filled 2013-08-12: qty 2

## 2013-08-12 MED ORDER — HYDROMORPHONE HCL PF 1 MG/ML IJ SOLN
1.0000 mg | Freq: Once | INTRAMUSCULAR | Status: AC
  Administered 2013-08-12: 1 mg via INTRAVENOUS
  Filled 2013-08-12: qty 1

## 2013-08-12 MED ORDER — OXYCODONE-ACETAMINOPHEN 5-325 MG PO TABS
2.0000 | ORAL_TABLET | Freq: Once | ORAL | Status: AC
  Administered 2013-08-12: 2 via ORAL
  Filled 2013-08-12: qty 2

## 2013-08-12 NOTE — Telephone Encounter (Signed)
i am ok with the DNR order

## 2013-08-12 NOTE — Telephone Encounter (Signed)
Dr. [name] please advise if you are okay with this? thanks

## 2013-08-12 NOTE — ED Notes (Signed)
Pt not given additional pain medications in the ED, notified hospice was at home to give medications.

## 2013-08-12 NOTE — Telephone Encounter (Signed)
Debbie from hospice called back. She reports she spoke w/ PW this AM and they are going to get the hospice docs to manage the pain medications. Will sign off message

## 2013-08-12 NOTE — ED Notes (Signed)
Patient presents to ED via [facility]. Pt c/o of lower abdominal pain that has been intermittent since Oct 2014. Pt also c/o of being nauseous and had 2 episodes of vomiting. Patient tearful and yelling upon arrival to ED. A&Ox4. VSS. A&Ox4.

## 2013-08-12 NOTE — Telephone Encounter (Signed)
lmtcb x1 for [name]

## 2013-08-12 NOTE — ED Provider Notes (Signed)
Medical screening examination/treatment/procedure(s) were performed by non-physician practitioner and as supervising physician I was immediately available for consultation/collaboration.   EKG Interpretation None       Olivia Mackie, MD 08/12/13 1744

## 2013-08-12 NOTE — ED Provider Notes (Signed)
CSN: [id number]     Arrival date & time 08/12/13  5621 History   First MD Initiated Contact with Patient 08/12/13 [phone]     Chief Complaint  Patient presents with  . Abdominal Pain     (Consider location/radiation/quality/duration/timing/severity/associated sxs/prior Treatment) The history is provided by the patient and medical records.   This is a 63 year old female with extensive past medical history including hypertension, diabetes, Crohn's disease, GERD, RA, recurrent small bowel obstructions and abdominal abscesses with recent dx of pelvic abscess currently on Ceftin, presenting to the ED for abdominal pain. Over the past several months, patient has been seen multiple times in the emergency department for the same. Today she states her pain is unchanged, however it is just not very well controlled on her home pain medications including Percocet, Zofran, and tramadol. Patient states she has been somewhat nauseated and vomiting, however denies this currently. She denies any fevers or chills. Patient was seen and evaluated in emergency department less than 48 hours ago with labs and abdominal imaging, no signs of small bowel obstruction at that time. Patient saw Dr. [name] yesterday and it was agreed that she needed Hospice/Palliative care which she has been enrolled in.  Vs stable on arrival.  Past Medical History  Diagnosis Date  . Other diseases of vocal cords   . Osteoporosis, unspecified   . Other and unspecified hyperlipidemia   . Esophageal reflux   . Chronic airway obstruction, not elsewhere classified   . Regional enteritis of unspecified site   . 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 Crohn's disease     Ileostomy  . Pelvic adhesions   . Anxiety   . Depression   . Hypertension   . Emphysema   . Hematuria   . Severe cervical dysplasia age 51    cone biopsy with neg paps since  . Pneumonia     "3-4 times; necrotizing once" (04/19/2013)  . Asthmatic  bronchitis , chronic   . Exertional shortness of breath   . On home oxygen therapy     "24/7; 2.5L @ rest; 3L when I'm doing my chores" (08/02/2013)  . Type II diabetes mellitus   . Anemia   . History of blood transfusion     "I've had 3 or 4" (04/19/2013)  . Rheumatoid arthritis(714.0) 03/2012    [place]; "mild in my hands" (04/19/2013)  . Small bowel obstruction due to adhesions 2014    [place]  . Pelvic abscess in female 2014   Past Surgical History  Procedure Laterality Date  . Ileostomy  2007  . Ear cyst excision Bilateral 1970's    "lobes"   . Cervical cone biopsy  1978  . Colon surgery  2007    subtotal colectomy with ileostomy  . Esophagogastroduodenoscopy (egd) with esophageal dilation      "3 times, I think" (04/19/2013)  . Oophorectomy      LSO  . Tracheostomy  2010  . Hammer toe surgery Bilateral 1980's  . Balloon dilation  02/10/2012    Procedure: BALLOON DILATION;  Surgeon: Charolett Bumpers, MD;  Location: [facility] ENDOSCOPY;  Service: Endoscopy;  Laterality: N/A;  . Colposcopy    . Flexible sigmoidoscopy  03/26/2012    Procedure: FLEXIBLE SIGMOIDOSCOPY;  Surgeon: Charolett Bumpers, MD;  Location: [facility]OSCOPY;  Service: Endoscopy;  Laterality: N/A;  . Cardiac catheterization  07/08/2007    normal  . Nm myocar perf wall motion  09/21/2006    no ischemia  . Ventral  hernia repair N/A 03/16/2013    Procedure: REPAIR OF INCISIONAL AND PARASTOMAL HERNIAS;  Surgeon: Mariella Saa, MD;  Location: [facility] OR;  Service: General;  Laterality: N/A;  . Laparotomy N/A 03/16/2013    Procedure: EXPLORATORY LAPAROTOMY;  Surgeon: Mariella Saa, MD;  Location: [facility] OR;  Service: General;  Laterality: N/A;  . Lysis of adhesion N/A 03/16/2013    Procedure: LYSIS OF ADHESIONS FOR SMALL BOWEL OBSTRUCTION;  Surgeon: Mariella Saa, MD;  Location: [facility] OR;  Service: General;  Laterality: N/A;  . Insertion of mesh N/A 03/16/2013    Procedure: INSERTION OF BIOLOGIC MESH;  Surgeon:  Mariella Saa, MD;  Location: [facility] OR;  Service: General;  Laterality: N/A;  . Tee without cardioversion N/A 03/21/2013    Procedure: TRANSESOPHAGEAL ECHOCARDIOGRAM (TEE);  Surgeon: Vesta Mixer, MD;  Location: [facility] ENDOSCOPY;  Service: Cardiovascular;  Laterality: N/A;  . Vaginal hysterectomy  1993    [facility]VH-LSO  . Tracheostomy closure  2010  . Hernia repair     Family History  Problem Relation Age of Onset  . Osteoporosis Mother   . Kidney disease Mother   . Hypertension Sister   . Stroke Sister   . Diabetes Sister    History  Substance Use Topics  . Smoking status: Former Smoker -- 1.00 packs/day for 32 years    Types: Cigarettes    Quit date: 05/26/2004  . Smokeless tobacco: Never Used  . Alcohol Use: 0.0 oz/week     Comment: 08/02/2013 "have a drink a couple times/yr"   OB History   Grav Para Term Preterm Abortions TAB SAB Ect Mult Living   3 0   3     0     Review of Systems  Gastrointestinal: Positive for nausea, vomiting and abdominal pain.  All other systems reviewed and are negative.    Allergies  Esomeprazole magnesium; Shellfish allergy; Surgical lubricant; Other; Peanut-containing drug products; Sulfate; and Levaquin  Home Medications   Current Outpatient Rx  Name  Route  Sig  Dispense  Refill  . Amino Acids-Protein Hydrolys (FEEDING SUPPLEMENT, PRO-STAT SUGAR FREE 64,) LIQD   Oral   Take 30 mLs by mouth daily.   900 mL   0   . arformoterol (BROVANA) 15 MCG/2ML NEBU   Nebulization   Take 15 mcg by nebulization 2 (two) times daily.         [name] aspirin 81 MG tablet   Oral   Take 81 mg by mouth every Monday, Wednesday, and Friday.          . budesonide (PULMICORT) 0.25 MG/2ML nebulizer solution   Nebulization   Take 0.25 mg by nebulization 2 (two) times daily.         . calcium-vitamin D (OSCAL WITH D) 500-200 MG-UNIT per tablet   Oral   Take 1 tablet by mouth 2 (two) times daily.         . cefUROXime (CEFTIN) 500 MG tablet   Oral    Take 1 tablet (500 mg total) by mouth 2 (two) times daily with a meal.   42 tablet   0   . Cholecalciferol (VITAMIN D) 1000 UNITS capsule   Oral   Take 1,000 Units by mouth daily.          . citalopram (CELEXA) 20 MG tablet   Oral   Take 20 mg by mouth daily.          . famotidine (PEPCID) 20 MG tablet  Oral   Take 20 mg by mouth 2 (two) times daily.         . fluticasone (FLONASE) 50 MCG/ACT nasal spray   Nasal   Place 2 sprays into the nose daily as needed.          [name] guaiFENesin (MUCINEX) 600 MG 12 hr tablet   Oral   Take 1,200 mg by mouth 2 (two) times daily.         [name] HYDROcodone-acetaminophen (NORCO/VICODIN) 5-325 MG per tablet      as needed.         . IRON PO   Oral   Take 1 tablet by mouth every Monday, Wednesday, and Friday.          [name] LORazepam (ATIVAN) 0.5 MG tablet      TAKE 1 TABLET BY MOUTH EVERY 8 HOURS AS NEEDED   60 tablet   0   . metoCLOPramide (REGLAN) 10 MG tablet   Oral   Take 1 tablet (10 mg total) by mouth 3 (three) times daily before meals.   90 tablet   1   . omeprazole (PRILOSEC) 40 MG capsule   Oral   Take 40 mg by mouth 2 (two) times daily.          . ondansetron (ZOFRAN) 4 MG tablet   Oral   Take 1 tablet (4 mg total) by mouth every 6 (six) hours as needed for nausea.   20 tablet   0   . oxyCODONE-acetaminophen (PERCOCET/ROXICET) 5-325 MG per tablet   Oral   Take 1-2 tablets by mouth every 6 (six) hours as needed for severe pain.   15 tablet   0   . predniSONE (DELTASONE) 20 MG tablet   Oral   Take 2 tablets (40 mg total) by mouth daily with breakfast.   60 tablet   0   . PROAIR HFA 108 (90 BASE) MCG/ACT inhaler   Inhalation   Inhale 2 puffs into the lungs every 6 (six) hours as needed for wheezing or shortness of breath.          [name] Respiratory Therapy Supplies (FLUTTER) DEVI   Inhalation   Inhale into the lungs. 2-4 times daily         . rosuvastatin (CRESTOR) 5 MG tablet   Oral   Take 5 mg  by mouth every Monday, Wednesday, and Friday.          . tiotropium (SPIRIVA) 18 MCG inhalation capsule   Inhalation   Place 1 capsule (18 mcg total) into inhaler and inhale daily.   30 capsule   5   . traMADol (ULTRAM) 50 MG tablet      as needed.          BP 138/71  Pulse 93  Temp(Src) 97.8 F (36.6 C) (Oral)  Resp 11  SpO2 100%  Physical Exam  Nursing note and vitals reviewed. Constitutional: She is oriented to person, place, and time. She appears well-developed and well-nourished. No distress.  Chronically ill appearing  HENT:  Head: Normocephalic and atraumatic.  Mouth/Throat: Oropharynx is clear and moist.  Eyes: Conjunctivae and EOM are normal. Pupils are equal, round, and reactive to light.  Neck: Normal range of motion. Neck supple.  Cardiovascular: Normal rate, regular rhythm and normal heart sounds.   Pulmonary/Chest: Effort normal and breath sounds normal. No respiratory distress. She has no wheezes.  Abdominal: Soft. Bowel sounds are normal. There is tenderness in the left lower quadrant.  There is no guarding.  Abdomen soft, non-distended, colostomy bag in place with small amount of output; generalized abdominal pain worse in LLQ  Musculoskeletal: Normal range of motion. She exhibits no edema.  Neurological: She is alert and oriented to person, place, and time.  Skin: Skin is warm and dry. She is not diaphoretic.  Psychiatric: She has a normal mood and affect.    ED Course  Procedures (including critical care time) Labs Review Labs Reviewed - No data to display Imaging Review Dg Abd Acute W/chest  08/10/2013   CLINICAL DATA:  Left-sided abdominal pain. History of Crohn's disease.  EXAM: ACUTE ABDOMEN SERIES (ABDOMEN 2 VIEW & CHEST 1 VIEW)  COMPARISON:  DG ABD ACUTE W/CHEST dated 08/02/2013; CT ABD/PELVIS W CM dated 07/28/2013  FINDINGS: The heart size and mediastinal contours are stable. The lungs are clear aside from mild left basilar scarring which appears  stable. There is probable mild underlying emphysema. 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  There is stable dilated bowel in the left lower quadrant. Right lower quadrant ostomy is noted. There is no free intraperitoneal air. Embolization coils are present within the upper abdomen.  IMPRESSION: Similar appearance of the chest and abdomen with dilated small bowel in the pelvis, possibly related to subtotal colectomy and ileostomy.   Electronically Signed   By: Roxy Horseman M.D.   On: 08/10/2013 21:12     EKG Interpretation None      MDM   Final diagnoses:  Chronic abdominal pain  Hospice care   On initial evaluation, pt is alone in her room, lying in bed in NAD.  Once family returns, she begins writhing in pain.  She denies any changes in the nature or pain, she is afebrile, is not tachycardic, and overall non-toxic appearing.  Pt had lab work and imaging performed in the ED <48 hours ago-- both of which i have reviewed-- no significant abnormalities, no signs of bowel obstruction.  Do not feel that repeat labs will change management in the ED today.  States her pain did improve with dilaudid given earlier.  She no longer is nauseated or vomiting, currently eating ice chips. Will give second dose of meds-- when pt was told this, she repeatedly kept asking specifically which pain medicine she would receive because dilaudid works better for her.  8:03 AM Micah Flesher in to re-check pt, states her pain is controlled at this time but "it never stays controlled for long."  Pt has had no further episodes of nausea or vomiting in the ED, she remains afebrile.  Pt states she has been enrolled in hospice/palliative care which was confirmed on medical record and i have read the notes from her physician appt yesterday.  I have explained to pt multiple times that we cannot manage her chronic pain at home and that since she is currently pain controlled in the ED, she does not need medical admission  at this time.  Pt became furious and began pounding her fists at me and stating "i don't want to talk to you anymore, you are stupid and don't understand what i am saying."  Pt refused to speak to me any further.  Husband did acknowledge that we have limited options currently on the ED as she has already been enrolled in hospice care.  Will call her center when they open at 0830 and discuss her care.  8:35 AM I have spoken with hospice and palliate care of [facility] who states a nurse  was already scheduled to go to her home today to evaluate her and start her pain regimen.  Pt will be discharged home so she can keep this appt time.  This information was relayed to pt and family by nursing staff as pt still refuses to speak to me.  8:59 AM Hospice case manager has arrived in the ED and is talking with family at bedside.  Hospice nurse will be going to their home today to evaluate and will call Dr. [name] to get medication recommendations for better pain control.  Family has agreed to transport pt home. She was given a dose of her home pain meds prior to discharge to prevent discomfort during transport.  Discussed with Dr. Norlene Campbell who agrees with assessment and plan of care.  Garlon Hatchet, PA-C 08/12/13 1050

## 2013-08-12 NOTE — ED Notes (Signed)
Hospice Nurse at bedside spoke with patient and family members in detail.  States pain increased general abdominal area 4/10 achy sharp.  Family able to transport patient home after patient is given additional pain medication.  Hospice Nurse spoke with provider who ordered additional pain medication.

## 2013-08-12 NOTE — Discharge Instructions (Signed)
Hospice will be at your home today to evaluate you and begin your hospice care. Follow up with Dr. [name].

## 2013-08-12 NOTE — ED Notes (Signed)
Pt's family called nurse into room, states patient still has pain-- PA informed. Instructed to d/c patient- hospice is coming to house to see pt.

## 2013-08-12 NOTE — ED Notes (Signed)
Hospice arrived to speak with patient and family members regarding plan of care.

## 2013-08-12 NOTE — ED Notes (Signed)
PA spoke with patient and family member at bedside.  Sister arrived and stated patient does not want speak with PA.  PA spoke with long term provider for patient and stated Home health will is scheduled to see patient at home.

## 2013-08-12 NOTE — Progress Notes (Addendum)
[facility] ED Rm A[address]  [facility] -[facility]ospice and[facility]all[facility] RN Visit -M. Konrad Dolores, RN  Pt just admitted to [facility] services yesterday 08/11/13 with Dx COPD, pt was a Full code status  seen at ED lying on stretcher alert, oriented stated uncontrolled abdominal pain brought her to the ED, able to report she had received IV pain medication twice with relief; while writer in room pt reports her pain was currently escalating and was a 4/10 - discussed difference between IV pain medication/ Oral pain medication and how long these may last in pt's system - pt indicated the Percocet she had at home would give relief but not last long- pt informed she had spoken with Misty Stanley PA ED provider who voiced, with pain relief, pt could discharge home; Ms Thien stated she was afraid that her pain once home would get out of control again; discussed concerns with pt, pt's sister Lars Masson, and S.O. Andreas Newport who were at bedside - discussed with pt and family, [facility] primary RN would plan to see pt after she returned home, she would assess needs and contact pt's MD to implement plan to address pain once home, Ms Howes was agreeable, stating that she felt, if she was able to get additional medication prior to leaving the ED -she she would be able to tolerate going home; she was agreeable to PO Percocet which she had used at home. Above discussed with Misty Stanley PA ED provider and Staff RN Tammy Sours Pt's sister given the [facility] Patient Care Line [phone] to call as they were leaving the ED   [facility] Team aware current plan is for pt to return home from ED and will follow-up  Valente David, [facility] Liaison [phone]

## 2013-08-12 NOTE — Telephone Encounter (Signed)
i called and made Debbie aware. Nothing further needed

## 2013-08-17 ENCOUNTER — Encounter (INDEPENDENT_AMBULATORY_CARE_PROVIDER_SITE_OTHER): Payer: Medicare Other | Admitting: General Surgery

## 2013-08-19 IMAGING — RF DG ESOPHAGUS
13 of 16 series · 19 of 24 positions shown · non-contrast
Comparison: None.

CLINICAL DATA: Dysphagia

ESOPHOGRAM/BARIUM SWALLOW
TECHNIQUE: Single contrast examination was performed using thin
barium.
Fluoroscopy time:  2.0 minutes.

[Series 1: run · 4 of 9 slices shown (1 of 13)]
[im 1/9]
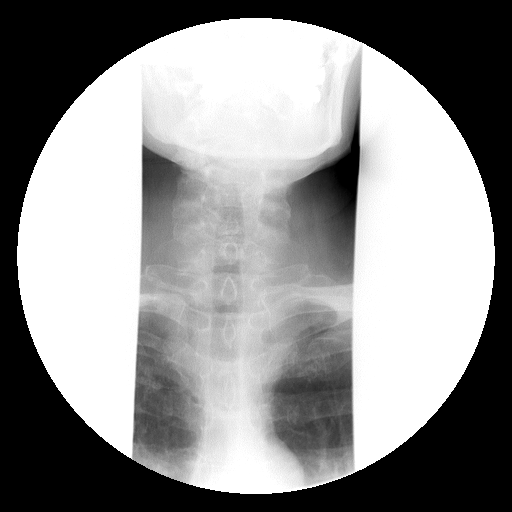
[im 3/9]
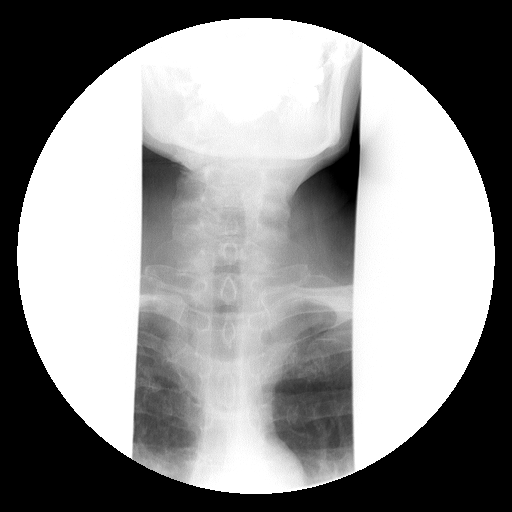
[im 7/9]
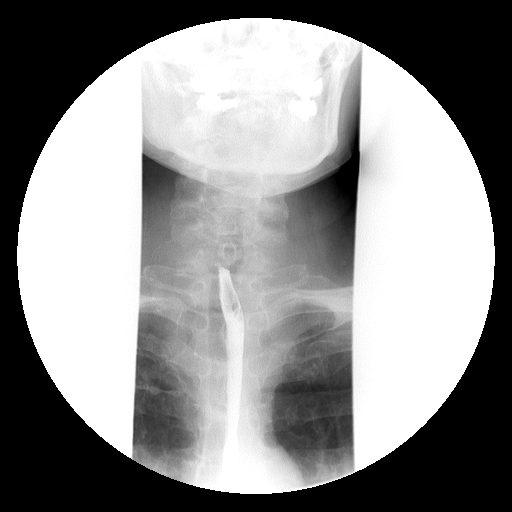
[im 9/9]
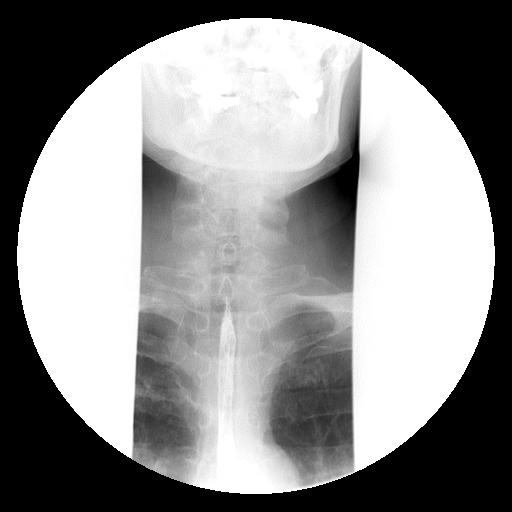

[Series 2: run · 4 of 9 slices shown (2 of 13)]
[im 1/9]
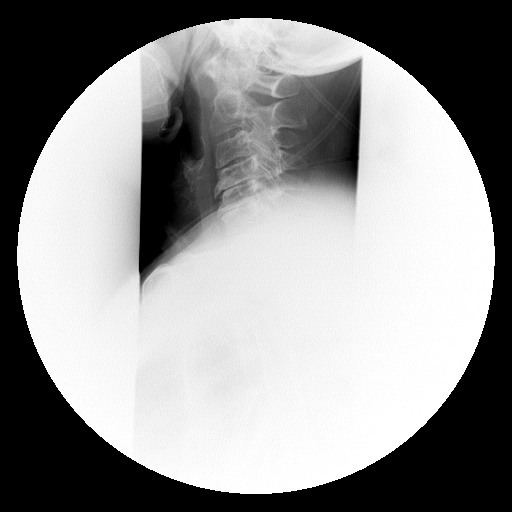
[im 3/9]
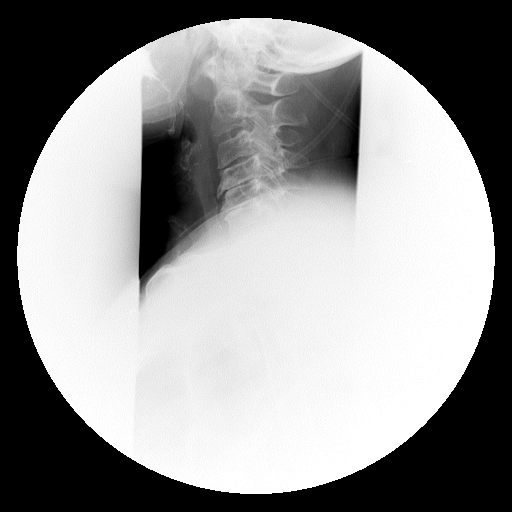
[im 7/9]
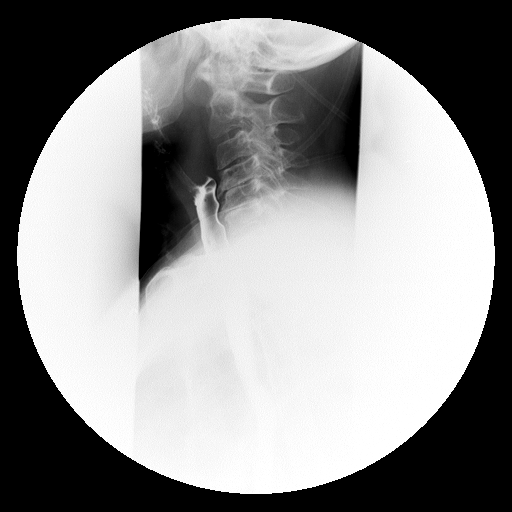
[im 9/9]
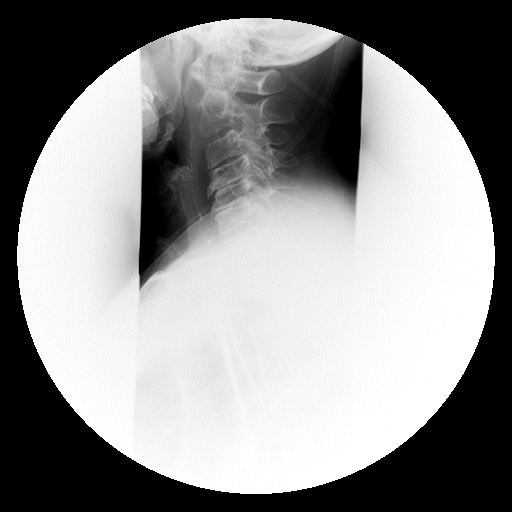

[Series 3: run · 1 of 1 slices shown (3 of 13)]
[im 1/1]
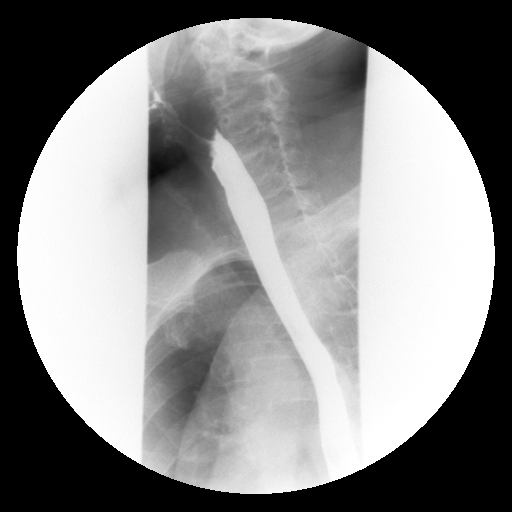

[Series 6: run · 1 of 1 slices shown (4 of 13)]
[im 1/1]
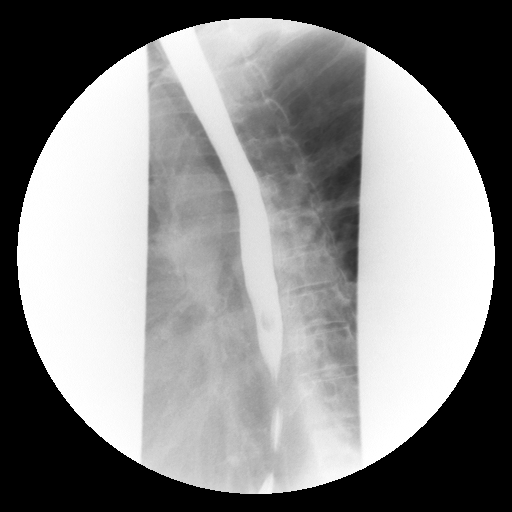

[Series 7: run · 1 of 1 slices shown (5 of 13)]
[im 1/1]
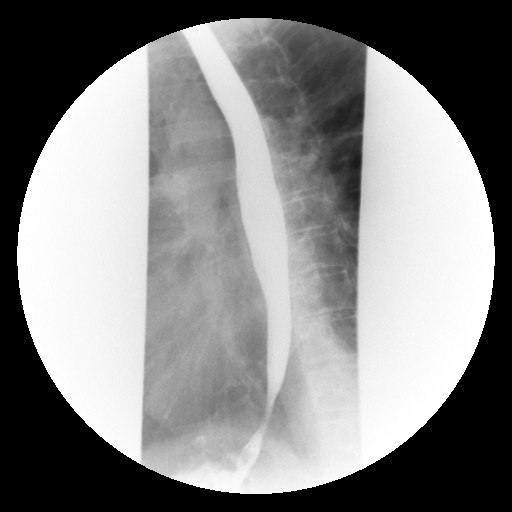

[Series 8: run · 1 of 1 slices shown (6 of 13)]
[im 1/1]
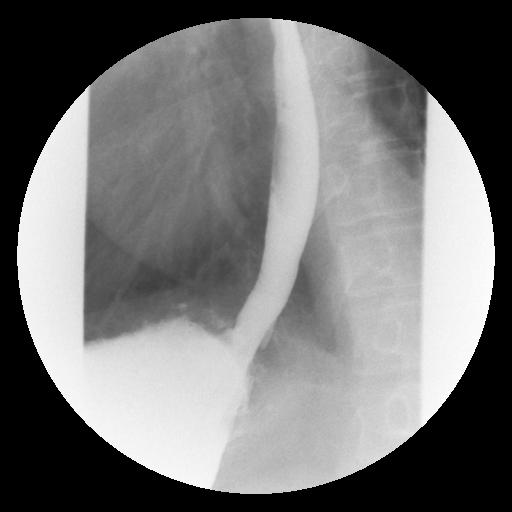

[Series 9: run · 1 of 1 slices shown (7 of 13)]
[im 1/1]
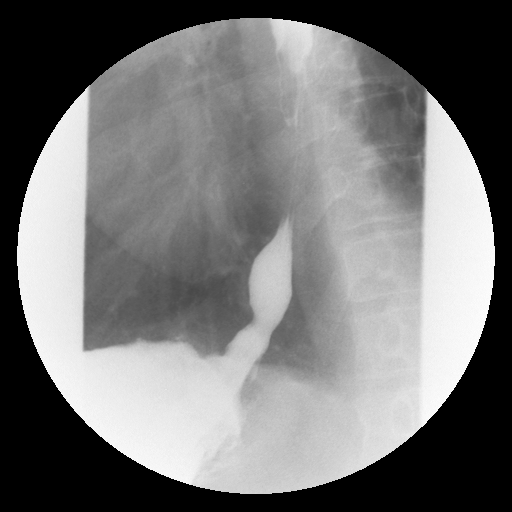

[Series 11: run · 1 of 1 slices shown (8 of 13)]
[im 1/1]
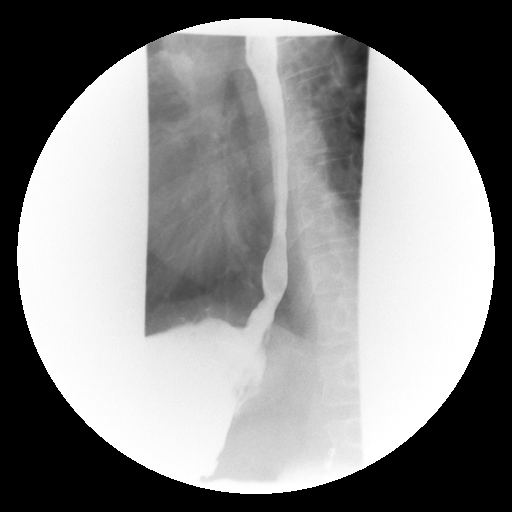

[Series 12: run · 1 of 1 slices shown (9 of 13)]
[im 1/1]
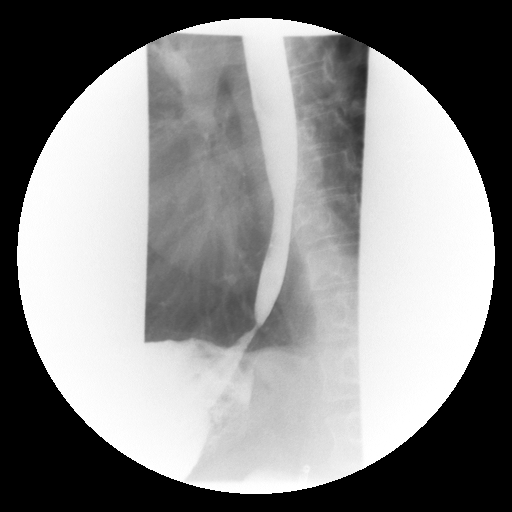

[Series 13: run · 1 of 1 slices shown (10 of 13)]
[im 1/1]
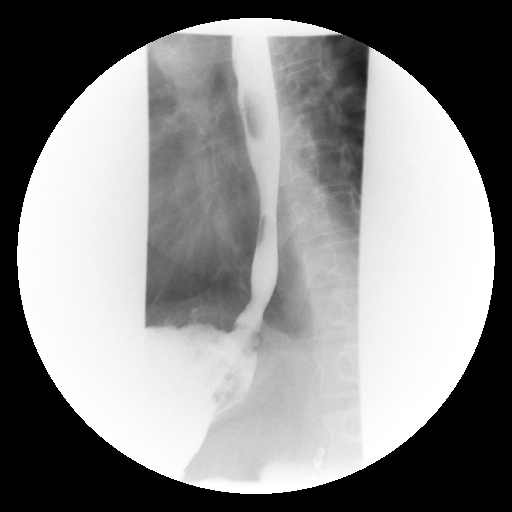

[Series 14: run · 1 of 1 slices shown (11 of 13)]
[im 1/1]
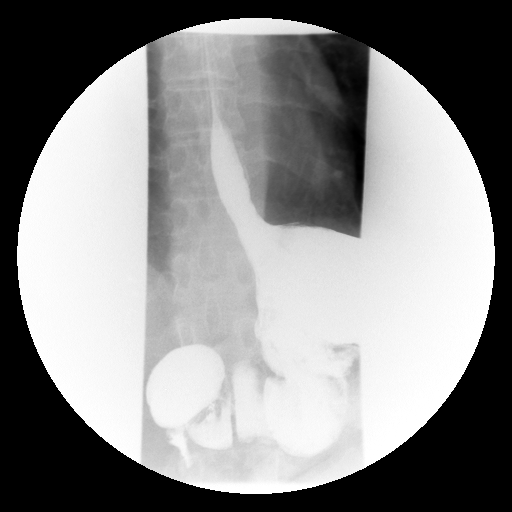

[Series 17: run · 1 of 1 slices shown (12 of 13)]
[im 1/1]
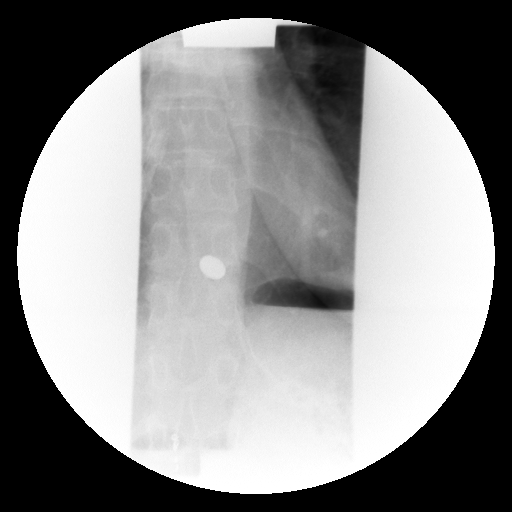

[Series 18: run · 1 of 1 slices shown (13 of 13)]
[im 1/1]
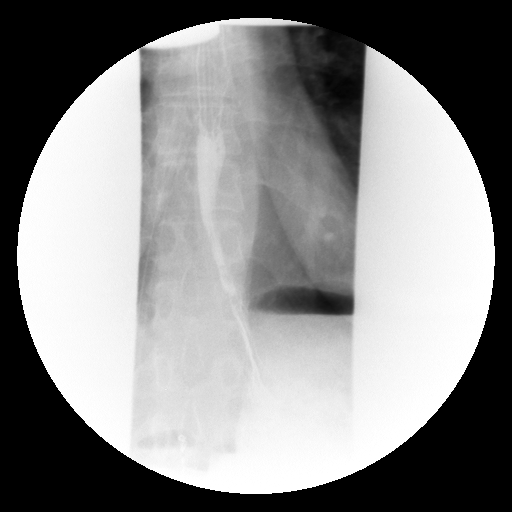

[19 of 24 positions shown; findings below may reference images not displayed]

FINDINGS: Initially rapid sequence spot films of the cervical
esophagus were performed.  The swallowing mechanism is
unremarkable.  There are degenerative changes noted in the mid to
lower cervical spine with anterior osteophytes present.  Esophageal
peristalsis is within normal limits.  There is an area of
persistent slight narrowing of the distal esophagus on several
images.  This may represent a short segment distal esophageal
stricture.  There is moderate gastroesophageal reflux demonstrated.
A barium pill was given at the end of the study which did delay in
passing into the stomach, but did pass after additional barium was
given.
IMPRESSION: 1.  Moderate gastroesophageal reflux.
2.  Question mild short segment distal esophageal stricture.  The
barium pill does delay at this level but does pass intact into the
stomach.

## 2013-08-23 ENCOUNTER — Telehealth: Payer: Self-pay | Admitting: *Deleted

## 2013-08-23 NOTE — Telephone Encounter (Signed)
Pt was called due for Reclast. She is in Hospice Care now and will not proceed with Reclast. KW CMA

## 2013-08-29 ENCOUNTER — Telehealth: Payer: Self-pay | Admitting: Critical Care Medicine

## 2013-08-29 NOTE — Telephone Encounter (Signed)
Debbie from [facility] is calling back - phone # [phone].

## 2013-08-29 NOTE — Telephone Encounter (Signed)
Stop ceftin Stay on prednisone but this Rx is from GI not from pulmonary.  She needs to get clarity from her GI doctor.

## 2013-08-29 NOTE — Telephone Encounter (Signed)
Called, spoke with Debbie with hospice: 1.  Pt was d/c'd on prednisone 20 mg take 2 qam during her last hospital admission in March.  Prednisone 20 mg rx was given by Dr. York Spaniel #60 x 0.  Debbie states pt believes PW would like her to cont with this.  She would like to clarify if pt should cont the prednisone at 40 mg qd.  If so, they will need an order. 2.  Pt is on ceftin 500 mg 1 tab bid.  She has 2 more days left.  Eunice Blase would like clarification:  Should pt stop the ceftin after she takes the remaining tabs or does she need to continue it as a maintenance abx?  If it needs to be continued, Eunice Blase will need order.   Dr. [name], pls advise.  Thank you.  [facility] is fine with call back tomorrow.

## 2013-08-29 NOTE — Telephone Encounter (Signed)
Received a "verification of medical expenses" form from [employer] at [employer] at [facility] regarding this pt.   I confirmed with Angelia with [employer] that this form needs to be sent to the [facility] [employer].   I have sent this to Angelia's attn through interdepartment mail as instructed. Angelia made aware through email that form has been sent.

## 2013-08-29 NOTE — Telephone Encounter (Signed)
Called, spoke with Debbie.  Informed her of below.  She verbalized understanding, will inform pt, and will also get clarity from GI dr regarding prednisone.  Nothing further needed at this time.

## 2013-08-30 ENCOUNTER — Telehealth: Payer: Self-pay | Admitting: *Deleted

## 2013-08-30 NOTE — Telephone Encounter (Signed)
Signed med order faxed to [facility].

## 2013-09-07 ENCOUNTER — Telehealth: Payer: Self-pay | Admitting: Critical Care Medicine

## 2013-09-07 NOTE — Telephone Encounter (Signed)
Received fax from [facility] for Supplemental Orders for pt's Lasix 20mg  BID prn under Tammy Parrett. This medication is not on pt's med list. Called Debbie w/ Hospice who reported that pt is currently taking this medication and was verified by a current medication bottle brought from pt's home.  This order will need to be signed.  is aware PW is out of the office until 4/17.  Dr 5/17, should pt continue this medication?  Would you mind signing the order when you return to the office?  Thank you.

## 2013-09-07 NOTE — Telephone Encounter (Signed)
Yes and will sign on my return

## 2013-09-08 NOTE — Telephone Encounter (Signed)
I called and made Debbie aware. Nothing further needed

## 2013-09-13 ENCOUNTER — Other Ambulatory Visit: Payer: Self-pay

## 2013-09-13 NOTE — Telephone Encounter (Deleted)
Rx was sent to pharmacy electronically. 

## 2013-09-14 NOTE — Telephone Encounter (Signed)
Received refill request for Metoprolol Tartrate. Per records, this was discontinued on 06/21/2013 during an admission to the hospital - "Patient hasn't taken medication in the last 30 days." Ronnell Guadalajara, CMA called patient and left message to call back.  *Patient will need to schedule an appointment if patient states that she should/need to go back on this medication.

## 2013-09-15 ENCOUNTER — Telehealth: Payer: Self-pay | Admitting: Critical Care Medicine

## 2013-09-15 NOTE — Telephone Encounter (Signed)
#   giving is not correct.  I called Debbie a diff hospice nurse listed in pt chart at [phone] Spoke with debbie and she reports she still see's pt and this medication refill was not mean't for [place]. It was for Dr. Theron Arista. She reports she has a call into their office. Nothing further is needed from [place].

## 2013-09-19 ENCOUNTER — Other Ambulatory Visit: Payer: Self-pay | Admitting: *Deleted

## 2013-09-19 NOTE — Telephone Encounter (Signed)
Rx refill received for Metoprolol from patient pharmacy, but medication no longer on medication list, After speaking with patient she states her Hospice nurse has already called and got the medication taken care of and to please not refill it.

## 2013-09-30 ENCOUNTER — Ambulatory Visit (INDEPENDENT_AMBULATORY_CARE_PROVIDER_SITE_OTHER): Admitting: General Surgery

## 2013-09-30 ENCOUNTER — Encounter (INDEPENDENT_AMBULATORY_CARE_PROVIDER_SITE_OTHER): Payer: Self-pay | Admitting: General Surgery

## 2013-09-30 VITALS — BP 126/80 | HR 74 | Temp 98.2°F | Ht 61.0 in | Wt 116.0 lb

## 2013-09-30 DIAGNOSIS — K5090 Crohn's disease, unspecified, without complications: Secondary | ICD-10-CM

## 2013-09-30 NOTE — Progress Notes (Signed)
Chief complaint: Followup pelvic abscess, small bowel obstruction, Crohn's disease  History: Patient returns to the office. She has a long complex course with a remote history of Crohn's disease and ileostomy. She is several months status post exploratory laparotomy for small bowel obstruction with adhesion down to a rectal stump. Postoperatively she developed a small pelvic abscess at the rectal stump which has been recurrent and persistent with intermittent low-grade bowel obstruction. She was last hospitalized in the middle of March and responded well to antibiotics. She has not had followup CT. She has end-stage COPD and is now under hospice care at home. She has been started on morphine for chronic abdominal pain. She comes in today in actually very good spirits. She denies any abdominal pain. She is tolerating a low residue diet without nausea or vomiting. Ileostomy is functioning well.  Exam: BP 126/80  Pulse 74  Temp(Src) 98.2 F (36.8 C)  Ht 5\' 1"  (1.549 m)  Wt 116 lb (52.617 kg)  BMI 21.93 kg/m2 General: Thin alert in no distress Abdomen: Soft and nontender. Nondistended. No hernias. Ileostomy functioning.  Assessment and plan: Crohn's disease, recurrent and persistent small pelvic abscess at her rectal stump with intermittent partial small bowel obstruction. She has end-stage COPD and is now under hospice care for this. She is on daily morphine for chronic abdominal pain. She actually feels like she is in pretty good place right now. I don't believe she would be a surgical candidate probably under any circumstances at this point. Fortunately I did not see any active surgical problems. I think clinical followup hello this fine and I'm going to be available as necessary for recurrent problems.

## 2013-10-03 ENCOUNTER — Telehealth: Payer: Self-pay | Admitting: Critical Care Medicine

## 2013-10-03 NOTE — Telephone Encounter (Signed)
Spoke with [place] She states that [employer] needs recert for o2  I advised that typically we have to do this here in the office, but she states that pt is Hospice and this is not needed  PW please advise thanks!

## 2013-10-03 NOTE — Telephone Encounter (Signed)
Pt in hospice No recert needed

## 2013-10-04 ENCOUNTER — Telehealth: Payer: Self-pay | Admitting: Critical Care Medicine

## 2013-10-04 NOTE — Telephone Encounter (Signed)
I spoke with pt sister and she states she spoke with [employer] and advised that the pt is in hospice so no recert needed. She states [facility] advised that hospice uses a different company so this is why they are needed recert. I advised I will look into this and let her know. I called hospice and left a message for Debbie, pt hospice nurse to discuss this with her to see if we can figure out what is needed. See previous phone note for more details . Will await call back. Carron Curie, CMA

## 2013-10-04 NOTE — Telephone Encounter (Signed)
Called [name] and made her aware. She reports she did not know this and will note this. Nothing further needed

## 2013-10-05 NOTE — Telephone Encounter (Signed)
Spoke with Debbie. She is going to go to [facility] and pick up letter and get what needs to be done. Sharyn Blitz is aware. Nothing further needed

## 2013-11-16 ENCOUNTER — Other Ambulatory Visit: Payer: Self-pay | Admitting: Critical Care Medicine

## 2014-01-17 ENCOUNTER — Other Ambulatory Visit: Payer: Self-pay | Admitting: Adult Health

## 2014-01-18 ENCOUNTER — Telehealth: Payer: Self-pay | Admitting: *Deleted

## 2014-01-18 NOTE — Telephone Encounter (Signed)
Medication not on med list currently.  However, will refill as per PW in the 4.15.15 phone note titled "Hospice - supplemental Lasix".

## 2014-01-18 NOTE — Telephone Encounter (Signed)
Pt called and asked about reclast. I told her that we discussed this back in March when she was ill. She wants to proceed. She needs to have a calcium and creatinine level. She has apt with Dr Allyne Gee 9/26 and will get me the results. Then we will proceed wKW CMA

## 2014-01-18 NOTE — Telephone Encounter (Signed)
(  finishing) then we will proceed with Reclast after we get the results back. KW CMA

## 2014-01-26 ENCOUNTER — Telehealth: Payer: Self-pay | Admitting: Critical Care Medicine

## 2014-01-26 DIAGNOSIS — J439 Emphysema, unspecified: Secondary | ICD-10-CM

## 2014-01-26 NOTE — Telephone Encounter (Signed)
Spoke with [name]  She states that she is wondering why we ordered M and E tanks since the pt has always been on liquid o2  I advised we will check with Dr [name] and call her back  Please advise thanks

## 2014-01-26 NOTE — Telephone Encounter (Signed)
I spoke with [name] and was advised that they did not win the bid with Medicare to provide equipment such as hospital beds. This will need to be sent to a different company. They can provide the pt with oxygen and nebulizer. Dr. [name] would you like the order for the hospital bed to be sent somewhere else, or would you prefer all the orders to be changed to a different DME that can provide everything for the pt? Carron Curie, CMA

## 2014-01-26 NOTE — Telephone Encounter (Signed)
[name], states that pt has liquid O2 w/ them.  Would like to know why we are ordering M & E tanks for portability.  [name] [phone].   [name]

## 2014-01-26 NOTE — Telephone Encounter (Signed)
Spoke with Lexa at hospice and she states PW is aware that pt is stable and will be discharged from hospice tomorrow .  They needs order sent to [facility] for hospital bed , oxygen concentrator and M and E tanks for portability.  Also nebulizer supplies.  Pt already has all this in the home that was originally placed by [facility] but Hospice will no longer be paying for this so order will need to go to [employer] for billing.  Order placed

## 2014-01-27 ENCOUNTER — Telehealth: Payer: Self-pay | Admitting: Critical Care Medicine

## 2014-01-27 DIAGNOSIS — J439 Emphysema, unspecified: Secondary | ICD-10-CM

## 2014-01-27 DIAGNOSIS — J9610 Chronic respiratory failure, unspecified whether with hypoxia or hypercapnia: Secondary | ICD-10-CM

## 2014-01-27 NOTE — Telephone Encounter (Signed)
Called spoke with [name]. She reports pt is wanting to keep liquid O2 and does not want to change her O2. Wants to make sure this is okay to do? Also per [name] they are not able to order pt bed. Please advise thanks

## 2014-01-27 NOTE — Telephone Encounter (Signed)
DME supplier no longer supporting liquid oxygen systems

## 2014-01-27 NOTE — Telephone Encounter (Signed)
Called spoke with [name]. Made her aware. Nothing further needed

## 2014-01-27 NOTE — Telephone Encounter (Signed)
Called spoke with [facility]. She reports they need order sent to [facility] for pt bed--Partial electric hospital bed.  She is aware we have already spoke with [employer] and they will keep pt on liquid O2. Order placed for hospital bed. Nothing further needed

## 2014-01-27 NOTE — Telephone Encounter (Signed)
If this is available i am ok with this order

## 2014-01-31 ENCOUNTER — Telehealth: Payer: Self-pay | Admitting: Critical Care Medicine

## 2014-01-31 DIAGNOSIS — J9610 Chronic respiratory failure, unspecified whether with hypoxia or hypercapnia: Secondary | ICD-10-CM

## 2014-01-31 DIAGNOSIS — J439 Emphysema, unspecified: Secondary | ICD-10-CM

## 2014-01-31 NOTE — Telephone Encounter (Signed)
Spoke with [facility] Needs order sent for Hospital Bed Order placed 01/27/14 was missing information--Order replaced Nothing further needed.

## 2014-02-01 ENCOUNTER — Telehealth: Payer: Self-pay | Admitting: Critical Care Medicine

## 2014-02-01 NOTE — Telephone Encounter (Signed)
Noted. Will review

## 2014-02-01 NOTE — Telephone Encounter (Signed)
Form placed in Dr. Lynelle Doctor folder to address.

## 2014-02-06 ENCOUNTER — Other Ambulatory Visit: Payer: Self-pay | Admitting: Critical Care Medicine

## 2014-02-06 NOTE — Telephone Encounter (Signed)
Called, spoke with pt regarding Dr. Lynelle Doctor recs on this form. Pt would like to clarify request and recs.  Pt states the ceramic crown procedure will be done under local anesthesia only - not general anesthesia.  It will take approx 2 hours.  Pt states she understanding she isn't a candidate for general anesthesia and would like to clarify if she would or would not be a candidate for local anesthesia.  Dr. [name], please advise.  Thank you.

## 2014-02-07 NOTE — Telephone Encounter (Signed)
Concerned about aspiration risk

## 2014-02-08 NOTE — Telephone Encounter (Signed)
Patient calling back about dental work.  [phone]

## 2014-02-08 NOTE — Telephone Encounter (Signed)
I spoke with the pt and notified of recs per PW  She is aware he will review paperwork when he returns to the office

## 2014-02-09 NOTE — Telephone Encounter (Signed)
Received refill request for prednisone 20 mg take qd. Prednisone is on pt's current med list as 20 mg tablets take 2 tablets (40mg ) qd, and, according to Epic, this rx was last given on 07/16/13 by Dr. 07/18/13, hospital MD, for #60 x 0. ? Is pt on prednisone for Chron's Disease? If so, she will need to contact that office for prednisone rx.   ATC pt to discuss - went directly to VM.  lmomtcb

## 2014-02-09 NOTE — Telephone Encounter (Signed)
Spoke with PW:  He is concerned with aspiration risk with local AND general anesthesia.  Given this, he does not believe pt is an appropriate candidate for the ceramic crowns procedure under local anesthesia.  I have faxed to form left by pt to [employer] [employer] [facility] with PW's recs and placed in scan folder.  Also, received refill request for prednisone 20 mg qd.  Prednisone is on pt's current med list as 20 mg tablets take 2 tablets (40mg ) qd, and, according to Epic, this rx was last given on 07/16/13 by Dr. 07/18/13, hospital MD, for #60 x 0.  ? Is pt on prednisone for Chron's Disease?  If so, she will need to contact that office for prednisone rx.    ATC pt - went directly to VM.  LMOMTCB for pt.

## 2014-02-10 MED ORDER — PRO-STAT SUGAR FREE PO LIQD: 30.0000 mL | Freq: Every day | ORAL | Status: DC

## 2014-02-10 MED ORDER — PREDNISONE 20 MG PO TABS: 20.0000 mg | ORAL_TABLET | Freq: Every day | ORAL | Status: DC

## 2014-02-10 MED ORDER — LORAZEPAM 0.5 MG PO TABS: ORAL_TABLET | ORAL | Status: DC

## 2014-02-10 NOTE — Telephone Encounter (Signed)
Called, spoke with pt -   Explained below to her per PW regarding recs for procedure.  She verbalized understanding and will discuss with Dr. Freida Busman how she should proceed from his standpoint.  Dr. [name]; 1.  Pt states she is on prednisone 20 mg qd for Chron's.  Pt reports hospice started this, and it has really helped.  She is no longer with hospice and only has 2 days left.  Pt states she has to "back track" to see who was following her for Chron's.  She is requesting PW refill this until she can see f/u for Chron's.  Please advise if ok.  If so, how many refills would you like to give? 2.  Pt states she is almost out of ativan.  Pt reports it was originally started by Dr. Allyne Gee, then was refilled by PW, then hospice.  Pt states she has ativan 0.5mg  take 1-2 tab q4h prn.  Pt reports she usually takes 2-3 tabs per day.  Ativan is on pt's current med list as: 0.5 mg take 1 po q8h prn. Dr. [name], as pt is no longer with hospice, she is requesting we call in rx.  Please advise if you are ok with this.  If so, pls clarify directions.  Thank you.

## 2014-02-10 NOTE — Telephone Encounter (Signed)
LMTCB

## 2014-02-10 NOTE — Telephone Encounter (Signed)
Pt returning call.[name] ° °

## 2014-02-10 NOTE — Telephone Encounter (Signed)
i am ok to refill these two meds  Pt will need OV soon to regroup

## 2014-02-10 NOTE — Telephone Encounter (Signed)
Thanks  Please advise on Ativan directions Msg below states that what she was taking is different than what we have listed  Thanks!

## 2014-02-10 NOTE — Telephone Encounter (Signed)
0.5mg  ativan every 4-6 hours prn

## 2014-02-10 NOTE — Telephone Encounter (Signed)
i am ok with prostat supp refill  Advise the pt she MUST : 1. Get appt with her GI MD again   2. REconnect with her primary care MD

## 2014-02-10 NOTE — Telephone Encounter (Signed)
Discussed with Crystal on rx quantity and refills > she advised okay to send enough of each medication to last until upcoming appt w/ PW.  However, the directions for the Ativan were never clarified for this pending rx.  Called spoke with patient and advised that PW okayed to refill her requested medications until her next appt (already scheduled for 10.5.15).  Pt very grateful and voiced her understanding.  [facility].  Pt also asks for rx for Pro-STAT protein supplement as well.  Advised will ask PW this as well.  Dr [name] please advise, thank you.

## 2014-02-10 NOTE — Telephone Encounter (Signed)
Called spoke with patient and informed her that PW okayed for the refill on the Pro-STAT.  Also advised other rx's will be telephoned to verified pharmacy.  Pt denied any further questions/concerns at this time.  She will contact Dr Freida Busman (dentist) on how to proceed from here. She is aware to contact the office if she needs anything further.  [facility] - spoke with pharmacist Dorene Grebe. Nothing further needed; will sign off.

## 2014-02-10 NOTE — Telephone Encounter (Signed)
See phone msg from 02/01/14 for additional information. 

## 2014-02-13 ENCOUNTER — Telehealth: Payer: Self-pay | Admitting: Critical Care Medicine

## 2014-02-13 DIAGNOSIS — J438 Other emphysema: Secondary | ICD-10-CM

## 2014-02-13 NOTE — Telephone Encounter (Signed)
I sent urgent request to [facility] for new neb machine  LMTCB for the pt

## 2014-02-13 NOTE — Telephone Encounter (Signed)
Pt returned call.  Advised an urgent request was sent to the [facility] for new neb machine.  Pt verbalized understanding.   [name]

## 2014-02-17 ENCOUNTER — Ambulatory Visit: Payer: Medicare Other | Admitting: Critical Care Medicine

## 2014-02-23 ENCOUNTER — Other Ambulatory Visit: Payer: Self-pay | Admitting: Critical Care Medicine

## 2014-02-24 ENCOUNTER — Telehealth: Payer: Self-pay | Admitting: Critical Care Medicine

## 2014-02-24 NOTE — Telephone Encounter (Signed)
Spoke with Aundrena(pt sister?) Pt was in [facility] and was D/C 01/27/14.  Metoprolol was being refilled by Dr Delanna Notice (hospice) Requesting refill by Dr [name]. Advised that our office is now closed and Dr [name] is not in office this afternoon. Pt states that this is not needed ASAP, it can wait until Monday.   Please advise if you are okay refilling this for the patient. Thanks.

## 2014-02-27 ENCOUNTER — Encounter: Payer: Self-pay | Admitting: Critical Care Medicine

## 2014-02-27 ENCOUNTER — Ambulatory Visit (INDEPENDENT_AMBULATORY_CARE_PROVIDER_SITE_OTHER): Payer: Medicare Other | Admitting: Critical Care Medicine

## 2014-02-27 VITALS — BP 120/86 | HR 89 | Temp 97.9°F

## 2014-02-27 DIAGNOSIS — R1032 Left lower quadrant pain: Secondary | ICD-10-CM

## 2014-02-27 DIAGNOSIS — Z23 Encounter for immunization: Secondary | ICD-10-CM

## 2014-02-27 DIAGNOSIS — Z515 Encounter for palliative care: Secondary | ICD-10-CM

## 2014-02-27 DIAGNOSIS — J438 Other emphysema: Secondary | ICD-10-CM

## 2014-02-27 MED ORDER — METOPROLOL TARTRATE 25 MG PO TABS: 12.5000 mg | ORAL_TABLET | Freq: Two times a day (BID) | ORAL | Status: DC

## 2014-02-27 MED ORDER — MORPHINE SULFATE ER 30 MG PO TBCR: 30.0000 mg | EXTENDED_RELEASE_TABLET | Freq: Two times a day (BID) | ORAL | Status: DC

## 2014-02-27 NOTE — Progress Notes (Addendum)
Subjective:    Patient ID: [name], female    DOB: January 05, 1951, 63 y.o.   MRN: [id number]  HPI 02/27/2014 Chief Complaint  Patient presents with  . Follow-up    was d/c'd from Hospice beginning of Sept - has increased SOB with rainy weather, chest tightness, and cough with small amount of clear mucus in the mornings.   Pt still has some soreness, but not daily.  Hx of perf colon with abscess. Needs to reconnect with [facility]. Pt denies any significant sore throat, nasal congestion or excess secretions, fever, chills, sweats, unintended weight loss, pleurtic or exertional chest pain, orthopnea PND, or leg swelling Pt denies any increase in rescue therapy over baseline, denies waking up needing it or having any early am or nocturnal exacerbations of coughing/wheezing/or dyspnea. Pt also denies any obvious fluctuation in symptoms with  weather or environmental change or other alleviating or aggravating factors  Review of Systems Constitutional:   No  weight loss, night sweats,  Fevers, chills, fatigue, lassitude. HEENT:   No headaches,  Difficulty swallowing,  Tooth/dental problems,  Sore throat,                No sneezing, itching, ear ache, nasal congestion, post nasal drip,   CV:  No chest pain,  Orthopnea, PND, swelling in lower extremities, anasarca, dizziness, palpitations  GI  No heartburn, indigestion, +++ LLQ abdominal pain, no  nausea, vomiting, diarrhea, change in bowel habits, loss of appetite  Resp: No shortness of breath with exertion or at rest.  No excess mucus, no productive cough,  No non-productive cough,  No coughing up of blood.  No change in color of mucus.  No wheezing.  No chest wall deformity  Skin: no rash or lesions.  GU: no dysuria, change in color of urine, no urgency or frequency.  No flank pain.  MS:  No joint pain or swelling.  No decreased range of motion.  No back pain.  Psych:  No change in mood or affect. No depression or anxiety.  No memory  loss.     Objective:   Physical Exam Filed Vitals:   02/27/14 1009  BP: 120/86  Pulse: 89  Temp: 97.9 F (36.6 C)  TempSrc: Oral  SpO2: 98%    Gen: Pleasant, well-nourished, in no distress,  normal affect  ENT: No lesions,  mouth clear,  oropharynx clear, no postnasal drip  Neck: No JVD, no TMG, no carotid bruits  Lungs: No use of accessory muscles, no dullness to percussion, distant breath sound  Cardiovascular: RRR, heart sounds normal, no murmur or gallops, no peripheral edema  Abdomen: soft , no HSM,  BS normal  tender left lower quadrant  Musculoskeletal: No deformities, no cyanosis or clubbing  Neuro: alert, non focal  Skin: Warm, no lesions or rashes  No results found.     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 Refill on MSCONTIN given Prevnar 13 was given Return in  2 months   Hospice care The patient has now been discharged from hospice  Abdominal pain Chronic abdominal pain from previous abscess Patient was placed on morphine on a continuing basis by hospice and now the patient maintains morphine continuous release 30 mg twice daily and morphine IR 15 mg every 4 hours as  needed For now we will maintain and refill these medications  NOTE THIS PT HAS END STAGE COPD AND NEEDS THE NEBULIZER TO BETTER DELIVER MEDICATIONS AND IS BENEFITTED FROM SAME   Updated Medication List Outpatient Encounter Prescriptions as of 02/27/2014  Medication Sig  . Amino Acids-Protein Hydrolys (FEEDING SUPPLEMENT, PRO-STAT SUGAR FREE 64,) LIQD Take 30 mLs by mouth daily.  [name] arformoterol (BROVANA) 15 MCG/2ML NEBU Take 15 mcg by nebulization 2 (two) times daily.  [name] aspirin 81 MG tablet Take 81 mg by mouth daily.   .  budesonide (PULMICORT) 0.25 MG/2ML nebulizer solution Take 0.25 mg by nebulization 2 (two) times daily.  . calcium citrate-vitamin D (CITRACAL+D) 315-200 MG-UNIT per tablet Take 1 tablet by mouth 2 (two) times daily.   . celecoxib (CELEBREX) 200 MG capsule Take 200 mg by mouth daily.  . famotidine (PEPCID) 20 MG tablet Take 20 mg by mouth 2 (two) times daily.  . fluticasone (FLONASE) 50 MCG/ACT nasal spray Place 2 sprays into the nose daily as needed.   . furosemide (LASIX) 20 MG tablet TAKE 1 TABLET TWICE A DAY AS NEEDED  . ipratropium-albuterol (DUONEB) 0.5-2.5 (3) MG/3ML SOLN Take 3 mLs by nebulization 4 (four) times daily as needed.  [name] LORazepam (ATIVAN) 0.5 MG tablet 1 tab by mouth every 4-6 hours as needed  . morphine (MS CONTIN) 30 MG 12 hr tablet Take 1 tablet (30 mg total) by mouth every 12 (twelve) hours.  [name] morphine (MSIR) 15 MG tablet Take 15 mg by mouth every 4 (four) hours as needed for severe pain.  [name] MUCINEX 600 MG 12 hr tablet TAKE 2 TABLETS TWICE A DAY  . omeprazole (PRILOSEC) 40 MG capsule Take 40 mg by mouth 2 (two) times daily.   . ondansetron (ZOFRAN) 4 MG tablet Take 1 tablet (4 mg total) by mouth every 6 (six) hours as needed for nausea.  . predniSONE (DELTASONE) 20 MG tablet Take 1 tablet (20 mg total) by mouth daily with breakfast.  . PROAIR HFA 108 (90 BASE) MCG/ACT inhaler Inhale 2 puffs into the lungs every 6 (six) hours as needed for wheezing or shortness of breath.   [name] Respiratory Therapy Supplies (FLUTTER) DEVI Inhale into the lungs. 2-4 times daily  . [DISCONTINUED] metoprolol tartrate (LOPRESSOR) 25 MG tablet Take 12.5 mg by mouth 2 (two) times daily.   . [DISCONTINUED] morphine (MS CONTIN) 30 MG 12 hr tablet Take 30 mg by mouth every 12 (twelve) hours.  . [DISCONTINUED] morphine (MSIR) 30 MG tablet Take 30 mg by mouth 2 (two) times daily.   . [DISCONTINUED] ondansetron (ZOFRAN-ODT) 4 MG disintegrating tablet Take 4 mg by mouth every 8 (eight) hours as needed for  nausea or vomiting.  . [DISCONTINUED] citalopram (CELEXA) 20 MG tablet Take 20 mg by mouth daily.   . [DISCONTINUED] rosuvastatin (CRESTOR) 5 MG tablet Take 5 mg by mouth every Monday, Wednesday, and Friday.   . [DISCONTINUED] senna (SENOKOT) 8.6 MG TABS tablet Take 1 tablet by mouth.

## 2014-02-27 NOTE — Patient Instructions (Addendum)
No change in inhaled or maintenance medications. Refill on MSCONTIN given Prevnar 13 was given Return in  2 months

## 2014-02-27 NOTE — Telephone Encounter (Signed)
i am ok with refilling this medication,  However the pt needs to re establish with her PCP MD

## 2014-02-27 NOTE — Assessment & Plan Note (Signed)
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

## 2014-02-27 NOTE — Assessment & Plan Note (Signed)
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 Refill on MSCONTIN given Prevnar 13 was given Return in  2 months

## 2014-02-27 NOTE — Telephone Encounter (Signed)
Called and spoke to [place]. Informed her of recs per PW. She verbalized understanding. [name] also stated pt is current at office with f/u with PW and stated pt will need a refill of MSIR 30mg . I advised that I will inform PW's nurse so the topic can be addressed at the visit. Crystal aware. Metoprolol sent to preferred pharm. Nothing further needed.

## 2014-02-27 NOTE — Assessment & Plan Note (Signed)
The patient has now been discharged from hospice

## 2014-02-27 NOTE — Telephone Encounter (Signed)
Rx sent per 02/24/14 phone msg.

## 2014-02-28 ENCOUNTER — Other Ambulatory Visit: Payer: Self-pay | Admitting: Critical Care Medicine

## 2014-02-28 NOTE — Telephone Encounter (Signed)
Received Metoprolol Tartrate 25 [facility] 27[facility] 2014 # 60 x 1. Called, spoke with Mellody [facility] with [facility]   Was advised they did receive rx from yesterday and nothing further is needed.

## 2014-03-08 ENCOUNTER — Other Ambulatory Visit: Payer: Self-pay | Admitting: Critical Care Medicine

## 2014-03-12 ENCOUNTER — Other Ambulatory Visit: Payer: Self-pay | Admitting: Critical Care Medicine

## 2014-03-14 ENCOUNTER — Encounter: Payer: Medicare Other | Admitting: Gynecology

## 2014-03-15 ENCOUNTER — Telehealth: Payer: Self-pay | Admitting: Critical Care Medicine

## 2014-03-15 NOTE — Telephone Encounter (Signed)
Pt had ONO done on 03/02/14. Per Dr. [name], pt re qualifies for o2.    Spoke with pt.  Informed her of above.  She verbalized understanding and is aware to continue to use o2 as prescribed.  She voiced no further questions or concerns at this time.  Results placed in scan folder.

## 2014-03-20 ENCOUNTER — Telehealth: Payer: Self-pay | Admitting: Cardiovascular Disease

## 2014-03-20 NOTE — Telephone Encounter (Signed)
Pt need her Celebrex  200 mg,she says the nurse usually order it for her.Please call,she says she does not pay for this.

## 2014-03-20 NOTE — Telephone Encounter (Signed)
Per Dr Royann Shivers, deferred to PCP. Attempted to call patient. Unable to leave detailed message. Refusal sent to patient's pharmacy electronically.

## 2014-03-22 ENCOUNTER — Inpatient Hospital Stay (HOSPITAL_COMMUNITY): Payer: Medicare Other

## 2014-03-22 ENCOUNTER — Inpatient Hospital Stay (HOSPITAL_COMMUNITY)
Admission: EM | Admit: 2014-03-22 | Discharge: 2014-03-27 | DRG: 189 | Disposition: A | Discharge status: Home / Self Care | Payer: Medicare Other | Attending: Pulmonary Disease | Admitting: Pulmonary Disease

## 2014-03-22 ENCOUNTER — Encounter (HOSPITAL_COMMUNITY): Payer: Self-pay | Admitting: Emergency Medicine

## 2014-03-22 ENCOUNTER — Emergency Department (HOSPITAL_COMMUNITY): Payer: Medicare Other

## 2014-03-22 DIAGNOSIS — R627 Adult failure to thrive: Secondary | ICD-10-CM

## 2014-03-22 DIAGNOSIS — Z7951 Long term (current) use of inhaled steroids: Secondary | ICD-10-CM | POA: Diagnosis not present

## 2014-03-22 DIAGNOSIS — Z90710 Acquired absence of both cervix and uterus: Secondary | ICD-10-CM | POA: Diagnosis not present

## 2014-03-22 DIAGNOSIS — Z882 Allergy status to sulfonamides status: Secondary | ICD-10-CM

## 2014-03-22 DIAGNOSIS — F329 Major depressive disorder, single episode, unspecified: Secondary | ICD-10-CM | POA: Diagnosis not present

## 2014-03-22 DIAGNOSIS — J9621 Acute and chronic respiratory failure with hypoxia: Principal | ICD-10-CM | POA: Diagnosis present

## 2014-03-22 DIAGNOSIS — R7989 Other specified abnormal findings of blood chemistry: Secondary | ICD-10-CM

## 2014-03-22 DIAGNOSIS — N739 Female pelvic inflammatory disease, unspecified: Secondary | ICD-10-CM

## 2014-03-22 DIAGNOSIS — R300 Dysuria: Secondary | ICD-10-CM | POA: Diagnosis not present

## 2014-03-22 DIAGNOSIS — Z87891 Personal history of nicotine dependence: Secondary | ICD-10-CM

## 2014-03-22 DIAGNOSIS — R0600 Dyspnea, unspecified: Secondary | ICD-10-CM | POA: Diagnosis present

## 2014-03-22 DIAGNOSIS — G8929 Other chronic pain: Secondary | ICD-10-CM | POA: Diagnosis present

## 2014-03-22 DIAGNOSIS — K5090 Crohn's disease, unspecified, without complications: Secondary | ICD-10-CM | POA: Diagnosis present

## 2014-03-22 DIAGNOSIS — I10 Essential (primary) hypertension: Secondary | ICD-10-CM | POA: Diagnosis present

## 2014-03-22 DIAGNOSIS — Z7982 Long term (current) use of aspirin: Secondary | ICD-10-CM | POA: Diagnosis not present

## 2014-03-22 DIAGNOSIS — M810 Age-related osteoporosis without current pathological fracture: Secondary | ICD-10-CM | POA: Diagnosis present

## 2014-03-22 DIAGNOSIS — IMO0001 Reserved for inherently not codable concepts without codable children: Secondary | ICD-10-CM

## 2014-03-22 DIAGNOSIS — J9620 Acute and chronic respiratory failure, unspecified whether with hypoxia or hypercapnia: Secondary | ICD-10-CM

## 2014-03-22 DIAGNOSIS — E119 Type 2 diabetes mellitus without complications: Secondary | ICD-10-CM | POA: Diagnosis present

## 2014-03-22 DIAGNOSIS — Z930 Tracheostomy status: Secondary | ICD-10-CM | POA: Diagnosis not present

## 2014-03-22 DIAGNOSIS — Z0389 Encounter for observation for other suspected diseases and conditions ruled out: Secondary | ICD-10-CM

## 2014-03-22 DIAGNOSIS — J441 Chronic obstructive pulmonary disease with (acute) exacerbation: Secondary | ICD-10-CM

## 2014-03-22 DIAGNOSIS — K4030 Unilateral inguinal hernia, with obstruction, without gangrene, not specified as recurrent: Secondary | ICD-10-CM

## 2014-03-22 DIAGNOSIS — R0602 Shortness of breath: Secondary | ICD-10-CM

## 2014-03-22 DIAGNOSIS — N879 Dysplasia of cervix uteri, unspecified: Secondary | ICD-10-CM

## 2014-03-22 DIAGNOSIS — K219 Gastro-esophageal reflux disease without esophagitis: Secondary | ICD-10-CM | POA: Diagnosis present

## 2014-03-22 DIAGNOSIS — J45909 Unspecified asthma, uncomplicated: Secondary | ICD-10-CM | POA: Diagnosis not present

## 2014-03-22 DIAGNOSIS — E785 Hyperlipidemia, unspecified: Secondary | ICD-10-CM | POA: Diagnosis present

## 2014-03-22 DIAGNOSIS — Z66 Do not resuscitate: Secondary | ICD-10-CM

## 2014-03-22 DIAGNOSIS — Z9981 Dependence on supplemental oxygen: Secondary | ICD-10-CM

## 2014-03-22 DIAGNOSIS — Z7952 Long term (current) use of systemic steroids: Secondary | ICD-10-CM | POA: Diagnosis not present

## 2014-03-22 DIAGNOSIS — J9610 Chronic respiratory failure, unspecified whether with hypoxia or hypercapnia: Secondary | ICD-10-CM

## 2014-03-22 DIAGNOSIS — E43 Unspecified severe protein-calorie malnutrition: Secondary | ICD-10-CM

## 2014-03-22 DIAGNOSIS — J9611 Chronic respiratory failure with hypoxia: Secondary | ICD-10-CM

## 2014-03-22 DIAGNOSIS — J438 Other emphysema: Secondary | ICD-10-CM

## 2014-03-22 DIAGNOSIS — F419 Anxiety disorder, unspecified: Secondary | ICD-10-CM | POA: Diagnosis present

## 2014-03-22 LAB — BASIC METABOLIC PANEL
Anion gap: 13 (ref 5–15)
BUN: 19 mg/dL (ref 6–23)
CHLORIDE: 99 meq/L (ref 96–112)
CO2: 30 mEq/L (ref 19–32)
Calcium: 9.7 mg/dL (ref 8.4–10.5)
Creatinine, Ser: 0.91 mg/dL (ref 0.50–1.10)
GFR calc non Af Amer: 65 mL/min — ABNORMAL LOW (ref >90)
GFR, EST AFRICAN AMERICAN: 76 mL/min — AB (ref >90)
Glucose, Bld: 103 mg/dL — ABNORMAL HIGH (ref 70–99)
POTASSIUM: 4.2 meq/L (ref 3.7–5.3)
SODIUM: 142 meq/L (ref 137–147)

## 2014-03-22 LAB — PRO B NATRIURETIC PEPTIDE: Pro B Natriuretic peptide (BNP): 588.7 pg/mL — ABNORMAL HIGH (ref 0–125)

## 2014-03-22 LAB — CBC
HCT: 34.5 % — ABNORMAL LOW (ref 36.0–46.0)
Hemoglobin: 10.4 g/dL — ABNORMAL LOW (ref 12.0–15.0)
MCH: 24 pg — ABNORMAL LOW (ref 26.0–34.0)
MCHC: 30.1 g/dL (ref 30.0–36.0)
MCV: 79.7 fL (ref 78.0–100.0)
PLATELETS: 207 10*3/uL (ref 150–400)
RBC: 4.33 MIL/uL (ref 3.87–5.11)
RDW: 14.4 % (ref 11.5–15.5)
WBC: 9.7 10*3/uL (ref 4.0–10.5)

## 2014-03-22 LAB — CBC WITH DIFFERENTIAL/PLATELET
Basophils Absolute: 0 10*3/uL (ref 0.0–0.1)
Basophils Relative: 0 % (ref 0–1)
Eosinophils Absolute: 0 10*3/uL (ref 0.0–0.7)
Eosinophils Relative: 0 % (ref 0–5)
HEMATOCRIT: 32.8 % — AB (ref 36.0–46.0)
Hemoglobin: 9.6 g/dL — ABNORMAL LOW (ref 12.0–15.0)
LYMPHS PCT: 10 % — AB (ref 12–46)
Lymphs Abs: 0.9 10*3/uL (ref 0.7–4.0)
MCH: 24.1 pg — ABNORMAL LOW (ref 26.0–34.0)
MCHC: 29.3 g/dL — ABNORMAL LOW (ref 30.0–36.0)
MCV: 82.2 fL (ref 78.0–100.0)
MONO ABS: 0.1 10*3/uL (ref 0.1–1.0)
Monocytes Relative: 1 % — ABNORMAL LOW (ref 3–12)
Neutro Abs: 8.8 10*3/uL — ABNORMAL HIGH (ref 1.7–7.7)
Neutrophils Relative %: 89 % — ABNORMAL HIGH (ref 43–77)
Platelets: 203 10*3/uL (ref 150–400)
RBC: 3.99 MIL/uL (ref 3.87–5.11)
RDW: 14.5 % (ref 11.5–15.5)
WBC: 9.8 10*3/uL (ref 4.0–10.5)

## 2014-03-22 LAB — CREATININE, SERUM
CREATININE: 0.91 mg/dL (ref 0.50–1.10)
GFR calc non Af Amer: 65 mL/min — ABNORMAL LOW (ref >90)
GFR, EST AFRICAN AMERICAN: 76 mL/min — AB (ref >90)

## 2014-03-22 LAB — I-STAT TROPONIN, ED: TROPONIN I, POC: 0 ng/mL (ref 0.00–0.08)

## 2014-03-22 MED ORDER — SODIUM CHLORIDE 0.9 % IV SOLN: 250.0000 mL | INTRAVENOUS | Status: DC | PRN

## 2014-03-22 MED ORDER — IPRATROPIUM-ALBUTEROL 0.5-2.5 (3) MG/3ML IN SOLN
3.0000 mL | Freq: Four times a day (QID) | RESPIRATORY_TRACT | Status: DC
  Administered 2014-03-23 – 2014-03-25 (×10): 3 mL via RESPIRATORY_TRACT
  Filled 2014-03-22 (×12): qty 3

## 2014-03-22 MED ORDER — ASPIRIN EC 81 MG PO TBEC
81.0000 mg | DELAYED_RELEASE_TABLET | Freq: Every day | ORAL | Status: DC
  Administered 2014-03-23 – 2014-03-27 (×5): 81 mg via ORAL
  Filled 2014-03-22 (×6): qty 1

## 2014-03-22 MED ORDER — MORPHINE SULFATE ER 30 MG PO TBCR
30.0000 mg | EXTENDED_RELEASE_TABLET | Freq: Two times a day (BID) | ORAL | Status: DC
  Administered 2014-03-23 – 2014-03-27 (×10): 30 mg via ORAL
  Filled 2014-03-22 (×10): qty 1

## 2014-03-22 MED ORDER — FUROSEMIDE 10 MG/ML IJ SOLN
40.0000 mg | INTRAMUSCULAR | Status: AC
  Administered 2014-03-22: 40 mg via INTRAVENOUS
  Filled 2014-03-22: qty 4

## 2014-03-22 MED ORDER — ASPIRIN 81 MG PO TABS: 81.0000 mg | ORAL_TABLET | Freq: Every day | ORAL | Status: DC

## 2014-03-22 MED ORDER — FAMOTIDINE 20 MG PO TABS
20.0000 mg | ORAL_TABLET | Freq: Two times a day (BID) | ORAL | Status: DC
  Administered 2014-03-23 – 2014-03-24 (×4): 20 mg via ORAL
  Filled 2014-03-22 (×5): qty 1

## 2014-03-22 MED ORDER — METHYLPREDNISOLONE SODIUM SUCC 125 MG IJ SOLR
125.0000 mg | Freq: Once | INTRAMUSCULAR | Status: AC
  Administered 2014-03-22: 125 mg via INTRAVENOUS
  Filled 2014-03-22: qty 2

## 2014-03-22 MED ORDER — PREDNISONE 20 MG PO TABS
20.0000 mg | ORAL_TABLET | Freq: Every day | ORAL | Status: DC
  Administered 2014-03-23 – 2014-03-27 (×5): 20 mg via ORAL
  Filled 2014-03-22 (×6): qty 1

## 2014-03-22 MED ORDER — ALBUTEROL SULFATE (2.5 MG/3ML) 0.083% IN NEBU: 2.5000 mg | INHALATION_SOLUTION | RESPIRATORY_TRACT | Status: DC | PRN

## 2014-03-22 MED ORDER — FLUTICASONE PROPIONATE 50 MCG/ACT NA SUSP
2.0000 | Freq: Two times a day (BID) | NASAL | Status: DC
  Administered 2014-03-23 – 2014-03-27 (×10): 2 via NASAL
  Filled 2014-03-22 (×2): qty 16

## 2014-03-22 MED ORDER — CELECOXIB 200 MG PO CAPS
200.0000 mg | ORAL_CAPSULE | Freq: Every day | ORAL | Status: DC
  Administered 2014-03-23 – 2014-03-27 (×6): 200 mg via ORAL
  Filled 2014-03-22 (×6): qty 1

## 2014-03-22 MED ORDER — METOPROLOL TARTRATE 12.5 MG HALF TABLET
12.5000 mg | ORAL_TABLET | Freq: Two times a day (BID) | ORAL | Status: DC
  Administered 2014-03-23 – 2014-03-27 (×10): 12.5 mg via ORAL
  Filled 2014-03-22 (×11): qty 1

## 2014-03-22 MED ORDER — HEPARIN SODIUM (PORCINE) 5000 UNIT/ML IJ SOLN
5000.0000 [IU] | Freq: Three times a day (TID) | INTRAMUSCULAR | Status: DC
  Administered 2014-03-23 – 2014-03-24 (×5): 5000 [IU] via SUBCUTANEOUS
  Filled 2014-03-22 (×9): qty 1

## 2014-03-22 MED ORDER — PRO-STAT SUGAR FREE PO LIQD
30.0000 mL | Freq: Every day | ORAL | Status: DC
  Administered 2014-03-23 – 2014-03-27 (×6): 30 mL via ORAL
  Filled 2014-03-22 (×6): qty 30

## 2014-03-22 MED ORDER — SODIUM CHLORIDE 0.9 % IV SOLN: INTRAVENOUS | Status: DC

## 2014-03-22 MED ORDER — CALCIUM CITRATE-VITAMIN D 315-200 MG-UNIT PO TABS: 1.0000 | ORAL_TABLET | Freq: Two times a day (BID) | ORAL | Status: DC

## 2014-03-22 MED ORDER — IOHEXOL 350 MG/ML SOLN
80.0000 mL | Freq: Once | INTRAVENOUS | Status: AC | PRN
  Administered 2014-03-22: 46 mL via INTRAVENOUS

## 2014-03-22 MED ORDER — IPRATROPIUM BROMIDE 0.02 % IN SOLN
0.5000 mg | Freq: Once | RESPIRATORY_TRACT | Status: AC
  Administered 2014-03-22: 0.5 mg via RESPIRATORY_TRACT
  Filled 2014-03-22: qty 2.5

## 2014-03-22 MED ORDER — ALBUTEROL SULFATE (2.5 MG/3ML) 0.083% IN NEBU
5.0000 mg | INHALATION_SOLUTION | Freq: Once | RESPIRATORY_TRACT | Status: AC
  Administered 2014-03-22: 5 mg via RESPIRATORY_TRACT
  Filled 2014-03-22: qty 6

## 2014-03-22 MED ORDER — CALCIUM CARBONATE-VITAMIN D 500-200 MG-UNIT PO TABS
1.0000 | ORAL_TABLET | Freq: Two times a day (BID) | ORAL | Status: DC
  Administered 2014-03-23 – 2014-03-27 (×10): 1 via ORAL
  Filled 2014-03-22 (×11): qty 1

## 2014-03-22 MED ORDER — ALBUTEROL (5 MG/ML) CONTINUOUS INHALATION SOLN
10.0000 mg/h | INHALATION_SOLUTION | RESPIRATORY_TRACT | Status: DC
  Administered 2014-03-22: 10 mg/h via RESPIRATORY_TRACT
  Filled 2014-03-22: qty 20

## 2014-03-22 NOTE — ED Notes (Signed)
Per EMS pt c/o SOB, pt noted to have expiratory wheezing, very little air movement. Pt received albuterol en route. Pt has hx of COPD/Asthma. Pt c/o chest tightness that is normal with SOB. EMS vitals 147/80, 94HR, 98% Rm air. Pt sts she has had an increase in swelling in all four extremities.

## 2014-03-22 NOTE — ED Provider Notes (Signed)
CSN: [id number]     Arrival date & time 03/22/14  1501 History   First MD Initiated Contact with Patient 03/22/14 1504     Chief Complaint  Patient presents with  . Shortness of Breath     (Consider location/radiation/quality/duration/timing/severity/associated sxs/prior Treatment) HPI Comments: Patient with hx of asthma, COPD, DM, HTN, emphysema, presents to the ED with a chief complaint of SOB and wheezing.  She states that the symptoms started this morning.  She states that she has tried taking a nebulizer with some relief.  She is on 2.5L home O2.  She denies chest pain, but endorses a tight feeling in her chest, and states that when she coughs it is painful.  She denies any fevers, chills, nausea, or vomiting.  Additionally, she states that she has had a headache since this morning.  She states that she very rarely gets headaches, but when she does get them, they feel similar to this.  The history is provided by the patient. No language interpreter was used.    Past Medical History  Diagnosis Date  . Other diseases of vocal cords   . Osteoporosis, unspecified   . Other and unspecified hyperlipidemia   . Esophageal reflux   . Chronic airway obstruction, not elsewhere classified   . Regional enteritis of unspecified site   . 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 Crohn's disease     Ileostomy  . Pelvic adhesions   . Anxiety   . Depression   . Hypertension   . Emphysema   . Hematuria   . Severe cervical dysplasia age 51    cone biopsy with neg paps since  . Pneumonia     "3-4 times; necrotizing once" (04/19/2013)  . Asthmatic bronchitis , chronic   . Exertional shortness of breath   . On home oxygen therapy     "24/7; 2.5L @ rest; 3L when I'm doing my chores" (08/02/2013)  . Type II diabetes mellitus   . Anemia   . History of blood transfusion     "I've had 3 or 4" (04/19/2013)  . Rheumatoid arthritis(714.0) 03/2012    [place]; "mild in my hands"  (04/19/2013)  . Small bowel obstruction due to adhesions 2014    [place]  . Pelvic abscess in female 2014   Past Surgical History  Procedure Laterality Date  . Ileostomy  2007  . Ear cyst excision Bilateral 1970's    "lobes"   . Cervical cone biopsy  1978  . Colon surgery  2007    subtotal colectomy with ileostomy  . Esophagogastroduodenoscopy (egd) with esophageal dilation      "3 times, I think" (04/19/2013)  . Oophorectomy      LSO  . Tracheostomy  2010  . Hammer toe surgery Bilateral 1980's  . Balloon dilation  02/10/2012    Procedure: BALLOON DILATION;  Surgeon: Charolett Bumpers, MD;  Location: [facility];  Service: Endoscopy;  Laterality: N/A;  . Colposcopy    . Flexible sigmoidoscopy  03/26/2012    Procedure: FLEXIBLE SIGMOIDOSCOPY;  Surgeon: Charolett Bumpers, MD;  Location: [facility];  Service: Endoscopy;  Laterality: N/A;  . Cardiac catheterization  07/08/2007    normal  . Nm myocar perf wall motion  09/21/2006    no ischemia  . Ventral hernia repair N/A 03/16/2013    Procedure: REPAIR OF INCISIONAL AND PARASTOMAL HERNIAS;  Surgeon: Mariella Saa, MD;  Location: [facility] OR;  Service: General;  Laterality: N/A;  . Laparotomy N/A  03/16/2013    Procedure: EXPLORATORY LAPAROTOMY;  Surgeon: Mariella Saa, MD;  Location: [facility] OR;  Service: General;  Laterality: N/A;  . Lysis of adhesion N/A 03/16/2013    Procedure: LYSIS OF ADHESIONS FOR SMALL BOWEL OBSTRUCTION;  Surgeon: Mariella Saa, MD;  Location: [facility] OR;  Service: General;  Laterality: N/A;  . Insertion of mesh N/A 03/16/2013    Procedure: INSERTION OF BIOLOGIC MESH;  Surgeon: Mariella Saa, MD;  Location: [facility] OR;  Service: General;  Laterality: N/A;  . Tee without cardioversion N/A 03/21/2013    Procedure: TRANSESOPHAGEAL ECHOCARDIOGRAM (TEE);  Surgeon: Vesta Mixer, MD;  Location: [facility];  Service: Cardiovascular;  Laterality: N/A;  . Vaginal hysterectomy  1993    [facility]-LSO  . Tracheostomy  closure  2010  . Hernia repair     Family History  Problem Relation Age of Onset  . Osteoporosis Mother   . Kidney disease Mother   . Hypertension Sister   . Stroke Sister   . Diabetes Sister    History  Substance Use Topics  . Smoking status: Former Smoker -- 1.00 packs/day for 32 years    Types: Cigarettes    Quit date: 05/26/2004  . Smokeless tobacco: Never Used  . Alcohol Use: 0.0 oz/week     Comment: 08/02/2013 "have a drink a couple times/yr"   OB History   Grav Para Term Preterm Abortions TAB SAB Ect Mult Living   3 0   3     0     Review of Systems  Constitutional: Negative for fever and chills.  Respiratory: Positive for cough, shortness of breath and wheezing.   Cardiovascular: Negative for chest pain.  Gastrointestinal: Negative for nausea, vomiting, diarrhea and constipation.  Genitourinary: Negative for dysuria.  Neurological: Positive for headaches.  All other systems reviewed and are negative.     Allergies  Esomeprazole magnesium; Shellfish allergy; Surgical lubricant; Other; Peanut-containing drug products; Sulfate; and Levaquin  Home Medications   Prior to Admission medications   Medication Sig Start Date End Date Taking? Authorizing Provider  Amino Acids-Protein Hydrolys (FEEDING SUPPLEMENT, PRO-STAT SUGAR FREE 64,) LIQD Take 30 mLs by mouth daily. 02/10/14   Storm Frisk, MD  arformoterol (BROVANA) 15 MCG/2ML NEBU Take 15 mcg by nebulization 2 (two) times daily.    Historical Provider, MD  aspirin 81 MG tablet Take 81 mg by mouth daily.     Historical Provider, MD  budesonide (PULMICORT) 0.25 MG/2ML nebulizer solution Take 0.25 mg by nebulization 2 (two) times daily.    Historical Provider, MD  calcium citrate-vitamin D (CITRACAL+D) 315-200 MG-UNIT per tablet Take 1 tablet by mouth 2 (two) times daily.     Historical Provider, MD  celecoxib (CELEBREX) 200 MG capsule Take 200 mg by mouth daily.    Historical Provider, MD  famotidine (PEPCID) 20  MG tablet Take 20 mg by mouth 2 (two) times daily.    Historical Provider, MD  fluticasone (FLONASE) 50 MCG/ACT nasal spray Place 2 sprays into the nose daily as needed.     Historical Provider, MD  furosemide (LASIX) 20 MG tablet TAKE 1 TABLET TWICE A DAY AS NEEDED 01/18/14   Storm Frisk, MD  ipratropium-albuterol (DUONEB) 0.5-2.5 (3) MG/3ML SOLN Inhale 3 mLs into the lungs 4 (four) times daily as needed. 03/10/14   Storm Frisk, MD  LORazepam (ATIVAN) 0.5 MG tablet 1 tab by mouth every 4-6 hours as needed 02/10/14   Storm Frisk, MD  metoprolol  tartrate (LOPRESSOR) 25 MG tablet Take 0.5 tablets (12.5 mg total) by mouth 2 (two) times daily. 02/27/14   Storm Frisk, MD  morphine (MS CONTIN) 30 MG 12 hr tablet Take 1 tablet (30 mg total) by mouth every 12 (twelve) hours. 02/27/14   Storm Frisk, MD  morphine (MSIR) 15 MG tablet Take 15 mg by mouth every 4 (four) hours as needed for severe pain.    Historical Provider, MD  MUCINEX 600 MG 12 hr tablet TAKE 2 TABLETS TWICE A DAY 11/16/13   Storm Frisk, MD  omeprazole (PRILOSEC) 40 MG capsule Take 40 mg by mouth 2 (two) times daily.     Historical Provider, MD  ondansetron (ZOFRAN) 4 MG tablet Take 1 tablet (4 mg total) by mouth every 6 (six) hours as needed for nausea. 04/29/13   Leroy Sea, MD  predniSONE (DELTASONE) 20 MG tablet TAKE 1 TABLET EVERY DAY 03/13/14   Storm Frisk, MD  PROAIR HFA 108 (90 BASE) MCG/ACT inhaler Inhale 2 puffs into the lungs every 6 (six) hours as needed for wheezing or shortness of breath.     Historical Provider, MD  Respiratory Therapy Supplies (FLUTTER) DEVI Inhale into the lungs. 2-4 times daily    Historical Provider, MD   There were no vitals taken for this visit. Physical Exam  Nursing note and vitals reviewed. Constitutional: She is oriented to person, place, and time. She appears well-developed and well-nourished.  HENT:  Head: Normocephalic and atraumatic.  Eyes: Conjunctivae and  EOM are normal. Pupils are equal, round, and reactive to light.  Neck: Normal range of motion. Neck supple.  Cardiovascular: Normal rate and regular rhythm.  Exam reveals no gallop and no friction rub.   No murmur heard. Pulmonary/Chest: Effort normal. No respiratory distress. She has wheezes. She has no rales. She exhibits no tenderness.  End expiratory wheezes  Abdominal: Soft. She exhibits no distension and no mass. There is no tenderness. There is no rebound and no guarding.  Musculoskeletal: Normal range of motion. She exhibits no edema and no tenderness.  Neurological: She is alert and oriented to person, place, and time.  Skin: Skin is warm and dry.  Psychiatric: She has a normal mood and affect. Her behavior is normal. Judgment and thought content normal.    ED Course  Procedures (including critical care time) Results for orders placed during the hospital encounter of 03/22/14  CBC WITH DIFFERENTIAL      Result Value Ref Range   WBC 9.8  4.0 - 10.5 K/uL   RBC 3.99  3.87 - 5.11 MIL/uL   Hemoglobin 9.6 (*) 12.0 - 15.0 g/dL   HCT 91.6 (*) 60.6 - 00.4 %   MCV 82.2  78.0 - 100.0 fL   MCH 24.1 (*) 26.0 - 34.0 pg   MCHC 29.3 (*) 30.0 - 36.0 g/dL   RDW 59.9  77.4 - 14.2 %   Platelets 203  150 - 400 K/uL   Neutrophils Relative % 89 (*) 43 - 77 %   Neutro Abs 8.8 (*) 1.7 - 7.7 K/uL   Lymphocytes Relative 10 (*) 12 - 46 %   Lymphs Abs 0.9  0.7 - 4.0 K/uL   Monocytes Relative 1 (*) 3 - 12 %   Monocytes Absolute 0.1  0.1 - 1.0 K/uL   Eosinophils Relative 0  0 - 5 %   Eosinophils Absolute 0.0  0.0 - 0.7 K/uL   Basophils Relative 0  0 - 1 %  Basophils Absolute 0.0  0.0 - 0.1 K/uL  BASIC METABOLIC PANEL      Result Value Ref Range   Sodium 142  137 - 147 mEq/L   Potassium 4.2  3.7 - 5.3 mEq/L   Chloride 99  96 - 112 mEq/L   CO2 30  19 - 32 mEq/L   Glucose, Bld 103 (*) 70 - 99 mg/dL   BUN 19  6 - 23 mg/dL   Creatinine, Ser 5.42  0.50 - 1.10 mg/dL   Calcium 9.7  8.4 - 70.6 mg/dL    GFR calc non Af Amer 65 (*) >90 mL/min   GFR calc Af Amer 76 (*) >90 mL/min   Anion gap 13  5 - 15  PRO B NATRIURETIC PEPTIDE      Result Value Ref Range   Pro B Natriuretic peptide (BNP) 588.7 (*) 0 - 125 pg/mL  CBC      Result Value Ref Range   WBC 9.7  4.0 - 10.5 K/uL   RBC 4.33  3.87 - 5.11 MIL/uL   Hemoglobin 10.4 (*) 12.0 - 15.0 g/dL   HCT 23.7 (*) 62.8 - 31.5 %   MCV 79.7  78.0 - 100.0 fL   MCH 24.0 (*) 26.0 - 34.0 pg   MCHC 30.1  30.0 - 36.0 g/dL   RDW 17.6  16.0 - 73.7 %   Platelets 207  150 - 400 K/uL  CREATININE, SERUM      Result Value Ref Range   Creatinine, Ser 0.91  0.50 - 1.10 mg/dL   GFR calc non Af Amer 65 (*) >90 mL/min   GFR calc Af Amer 76 (*) >90 mL/min  I-STAT TROPOININ, ED      Result Value Ref Range   Troponin i, poc 0.00  0.00 - 0.08 ng/mL   Comment 3            Ct Angio Chest Pe W/cm &/or Wo Cm  03/22/2014   CLINICAL DATA:  Shortness of breath. Wheezing. Chest tightness with increased swelling and extremities  EXAM: CT ANGIOGRAPHY CHEST WITH CONTRAST  TECHNIQUE: Multidetector CT imaging of the chest was performed using the standard protocol during bolus administration of intravenous contrast. Multiplanar CT image reconstructions and MIPs were obtained to evaluate the vascular anatomy.  CONTRAST:  59mL OMNIPAQUE IOHEXOL 350 MG/ML SOLN  COMPARISON:  04/19/2013  FINDINGS: Mediastinum: The heart size appears normal. There is no pericardial effusion identified. Mild calcified atherosclerotic disease involves the thoracic aorta. Diffuse diffuse AP narrowing of the intra-thoracic portion of the trachea compatible with chronic obstructive pulmonary disease. The esophagus appears normal. No abnormal filling defects noted to suggest acute pulmonary embolus.  Lungs/Pleura: No pleural effusion. There are moderate changes of centrilobular emphysema. Scar versus atelectasis is noted in the right upper lobe. No airspace consolidation. No suspicious pulmonary nodule or mass  identified.  Upper Abdomen: No acute findings identified within the limited portions of the upper abdomen.  Musculoskeletal: Review of the visualized osseous structures is unremarkable.  Review of the MIP images confirms the above findings.  IMPRESSION: 1. No evidence for acute pulmonary embolus. 2. COPD/emphysema.   Electronically Signed   By: Signa Kell M.D.   On: 03/22/2014 21:33   Dg Chest Port 1 View  03/22/2014   CLINICAL DATA:  Chest pain, shortness of breath.  EXAM: PORTABLE CHEST - 1 VIEW  COMPARISON:  August 10, 2013.  FINDINGS: The heart size and mediastinal contours are within normal limits. Both lungs  are clear. No pneumothorax or pleural effusion is noted. 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  IMPRESSION: No acute cardiopulmonary abnormality seen.   Electronically Signed   By: Roque Lias M.D.   On: 03/22/2014 15:42      EKG Interpretation None      MDM   Final diagnoses:  SOB (shortness of breath)    Patient wheezing and probable COPD exacerbation.  Will check labs, treat with nebs and reassess.  Reassess headache after breathing improves.  Not sudden onset HA.  Similar to prior HAs in the past.  Doubt SAH.  5:08 PM HA has resolved.  Breathing improved, but still SOB with exertion.  Will give hour long nebs.  BNP elevated, will give some lasix.  7:48 PM Patient is still have SOB, becomes quite short of breath with ambulation.  Patient discussed with Dr. Anitra Lauth.  Patient discussed with pulmonology, who will admit the patient.  Roxy Horseman, PA-C 03/22/14 2144

## 2014-03-22 NOTE — ED Notes (Signed)
Pt ambulated on 2L oxygen; sats dropped to 94% after returning to room; respirations 32; HR 150s. Pt assisted on bedside commode per request following lasix administration

## 2014-03-22 NOTE — ED Notes (Signed)
Pt remains seated on bedside commode per request. Attempted to assist pt back to bed; pt wanting to stay on bedside commode due to "contant" urination.

## 2014-03-22 NOTE — H&P (Signed)
Name: [name] MRN: [id number] DOB: [birth year]    ADMISSION DATE:  03/22/2014 CONSULTATION DATE:  03/22/2014  REFERRING MD :  EDP  CHIEF COMPLAINT:  SOB  BRIEF PATIENT DESCRIPTION: 63 y.o. F, followed by PW for COPD, on 2.5L O2 at baseline, brought to [facility] ED 10/28 with SOB and exertional dyspnea with increasing O2 requirements.  PCCM called for admission.  SIGNIFICANT EVENTS  10/28 - admitted with SOB and exertional dyspnea, increasing O2 requirements.  STUDIES:  CTA Chest 10/28 >>> neg for PE.   HISTORY OF PRESENT ILLNESS:  [name] is a 63 y.o. F with PMH as outlined below.  She is followed by Dr. [name] as outpatient for COPD and is on 2.5L O2 24 hours/day at baseline.  She reports that she was in her USOH up until around 3AM on 10/28 when she suddenly awoke from sleep with SOB.  She states that she needed to use the restroom; however, was so SOB that she was unable to walk to restroom.  She required assistance from family member and with help, was able to get to restroom; however, was profoundly SOB.  She states that at baseline, she is able to walk around the house doing various chores.  She occasionally gets SOB when she overdoes things; however, nothing as bad as this. She denies any recent travel, no personal or family hx of clotting disorders, no personal or family hx of malignancy, denies cough / hemoptysis.  Denies any hx of PE or DVT.  Does report that she has been less active than usual over the past week or so and has had periods of prolonged immobilization.  Further, she endorses pain in her bilateral calves which she thought were just muscle cramps, R > L.  PFT's from 2011:  FEV1 .40 pre / .53 post.  FEV1/FVC 39 pre, 40 post.  Pt also has hx of prior trach.  In ED, CXR was completely clear; however, pt remained hypoxic and required 3L O2 to maintain SpO2 > 93%.  CTA chest was negative for PE.  PCCM was called for admission.   PAST MEDICAL HISTORY :   has a  past medical history of Other diseases of vocal cords; Osteoporosis, unspecified; Other and unspecified hyperlipidemia; Esophageal reflux; Chronic airway obstruction, not elsewhere classified; Regional enteritis of unspecified site; Obstructive chronic bronchitis without exacerbation; Crohn's disease; Pelvic adhesions; Anxiety; Depression; Hypertension; Emphysema; Hematuria; Severe cervical dysplasia (age 11); Pneumonia; Asthmatic bronchitis , chronic; Exertional shortness of breath; On home oxygen therapy; Type II diabetes mellitus; Anemia; History of blood transfusion; Rheumatoid arthritis(714.0) (03/2012); Small bowel obstruction due to adhesions (2014); and Pelvic abscess in female (2014).  has past surgical history that includes Ileostomy (2007); Ear Cyst Excision (Bilateral, 1970's); Cervical cone biopsy (1978); Colon surgery (2007); Esophagogastroduodenoscopy (egd) with esophageal dilation; Oophorectomy; Tracheostomy (2010); Hammer toe surgery (Bilateral, 1980's); Balloon dilation (02/10/2012); Colposcopy; Flexible sigmoidoscopy (03/26/2012); Cardiac catheterization (07/08/2007); nm myocar perf wall motion (09/21/2006); Ventral hernia repair (N/A, 03/16/2013); laparotomy (N/A, 03/16/2013); Lysis of adhesion (N/A, 03/16/2013); Insertion of mesh (N/A, 03/16/2013); TEE without cardioversion (N/A, 03/21/2013); Vaginal hysterectomy (1993); Tracheostomy closure (2010); and Hernia repair. Prior to Admission medications   Medication Sig Start Date End Date Taking? Authorizing Provider  aspirin 81 MG tablet Take 81 mg by mouth daily.    Yes Historical Provider, MD  calcium citrate-vitamin D (CITRACAL+D) 315-200 MG-UNIT per tablet Take 1 tablet by mouth 2 (two) times daily.    Yes Historical Provider, MD  celecoxib (CELEBREX) 200  MG capsule Take 200 mg by mouth daily.   Yes Historical Provider, MD  doxycycline (VIBRA-TABS) 100 MG tablet Take 1 tablet by mouth daily. 03/08/14  Yes Historical Provider, MD  famotidine  (PEPCID) 20 MG tablet Take 20 mg by mouth 2 (two) times daily.   Yes Historical Provider, MD  fluticasone (FLONASE) 50 MCG/ACT nasal spray Place 2 sprays into the nose 2 (two) times daily.    Yes Historical Provider, MD  furosemide (LASIX) 20 MG tablet Take 20 mg by mouth every other day.   Yes Historical Provider, MD  ipratropium-albuterol (DUONEB) 0.5-2.5 (3) MG/3ML SOLN Take 3 mLs by nebulization every 4 (four) hours as needed.   Yes Historical Provider, MD  LORazepam (ATIVAN) 0.5 MG tablet 1 tab by mouth every 4-6 hours as needed 02/10/14  Yes Storm Frisk, MD  metoprolol tartrate (LOPRESSOR) 25 MG tablet Take 0.5 tablets (12.5 mg total) by mouth 2 (two) times daily. 02/27/14  Yes Storm Frisk, MD  morphine (MS CONTIN) 30 MG 12 hr tablet Take 1 tablet (30 mg total) by mouth every 12 (twelve) hours. 02/27/14  Yes Storm Frisk, MD  morphine (MSIR) 15 MG tablet Take 15 mg by mouth every 4 (four) hours as needed for severe pain.   Yes Historical Provider, MD  MUCINEX 600 MG 12 hr tablet TAKE 2 TABLETS TWICE A DAY 11/16/13  Yes Storm Frisk, MD  predniSONE (DELTASONE) 20 MG tablet TAKE 1 TABLET EVERY DAY 03/13/14  Yes Storm Frisk, MD  predniSONE (DELTASONE) 20 MG tablet Take 20 mg by mouth daily with breakfast.   Yes Historical Provider, MD  PROAIR HFA 108 (90 BASE) MCG/ACT inhaler Inhale 2 puffs into the lungs every 6 (six) hours as needed for wheezing or shortness of breath.    Yes Historical Provider, MD  Respiratory Therapy Supplies (FLUTTER) DEVI Inhale into the lungs. 2-4 times daily   Yes Historical Provider, MD  Amino Acids-Protein Hydrolys (FEEDING SUPPLEMENT, PRO-STAT SUGAR FREE 64,) LIQD Take 30 mLs by mouth daily. 02/10/14   Storm Frisk, MD   Allergies  Allergen Reactions  . Esomeprazole Magnesium Other (See Comments)    NEXIUM - reaction > aggravated pt's Crohn's disease  . Shellfish Allergy Shortness Of Breath and Nausea And Vomiting  . Surgical Lubricant Other  (See Comments)    Burns skin   . Other Nausea And Vomiting    Beans, Dander/Dust, Peas, Mushrooms  . Peanut-Containing Drug Products Nausea And Vomiting  . Sulfate     N/V  . Levaquin [Levofloxacin]     FAMILY HISTORY:  family history includes Diabetes in her sister; Hypertension in her sister; Kidney disease in her mother; Osteoporosis in her mother; Stroke in her sister. SOCIAL HISTORY:  reports that she quit smoking about 9 years ago. Her smoking use included Cigarettes. She has a 32 pack-year smoking history. She has never used smokeless tobacco. She reports that she drinks alcohol. She reports that she does not use illicit drugs.  REVIEW OF SYSTEMS:   All negative; except for those that are bolded, which indicate positives.  Constitutional: weight loss, weight gain, night sweats, fevers, chills, fatigue, weakness.  HEENT: headaches, sore throat, sneezing, nasal congestion, post nasal drip, difficulty swallowing, tooth/dental problems, visual complaints, visual changes, ear aches. Neuro: difficulty with speech, weakness, numbness, ataxia. CV:  chest pain, orthopnea, PND, swelling in lower extremities, dizziness, palpitations, syncope.  Resp: cough, hemoptysis, dyspnea, wheezing. GI  heartburn, indigestion, abdominal pain, nausea, vomiting,  diarrhea, constipation, change in bowel habits, loss of appetite, hematemesis, melena, hematochezia.  GU: dysuria, change in color of urine, urgency or frequency, flank pain, hematuria. MSK: joint pain or swelling, decreased range of motion, pain in bilateral calves. Psych: change in mood or affect, depression, anxiety, suicidal ideations, homicidal ideations. Skin: rash, itching, bruising.   SUBJECTIVE:  Breathing much improved since earlier in the day and since arrival in ED.  Has been on bedside commode voiding after being given lasix.  VITAL SIGNS: Temp:  [98.2 F (36.8 C)-98.6 F (37 C)] 98.5 F (36.9 C) (10/28 2158) Pulse Rate:   [100-150] 133 (10/28 2158) Resp:  [14-32] 18 (10/28 2158) BP: (101-139)/(60-93) 101/85 mmHg (10/28 2158) SpO2:  [94 %-100 %] 100 % (10/28 2158) Weight:  [53.5 kg (117 lb 15.1 oz)] 53.5 kg (117 lb 15.1 oz) (10/28 2158)  PHYSICAL EXAMINATION: General: WDWN female, sitting on bedside commode, in NAD. Neuro: A&O x 3, non-focal.  HEENT: [place]/AT. PERRL, sclerae anicteric.  On 3L O2 via [place]. Cardiovascular: Tachy but regular, no M/R/G.  Lungs: Respirations even and unlabored.  CTA bilaterally, No W/R/R.  Abdomen: BS x 4, soft, NT/ND.  Ostomy in place. Musculoskeletal: No gross deformities, no edema.  Bilateral calves tender to palpation, + homans on RLE. Skin: Intact, warm, no rashes.   Recent Labs Lab 03/22/14 1516 03/22/14 2031  NA 142  --   K 4.2  --   CL 99  --   CO2 30  --   BUN 19  --   CREATININE 0.91 0.91  GLUCOSE 103*  --     Recent Labs Lab 03/22/14 1516 03/22/14 2031  HGB 9.6* 10.4*  HCT 32.8* 34.5*  WBC 9.8 9.7  PLT 203 207   Ct Angio Chest Pe W/cm &/or Wo Cm  03/22/2014   CLINICAL DATA:  Shortness of breath. Wheezing. Chest tightness with increased swelling and extremities  EXAM: CT ANGIOGRAPHY CHEST WITH CONTRAST  TECHNIQUE: Multidetector CT imaging of the chest was performed using the standard protocol during bolus administration of intravenous contrast. Multiplanar CT image reconstructions and MIPs were obtained to evaluate the vascular anatomy.  CONTRAST:  70mL OMNIPAQUE IOHEXOL 350 MG/ML SOLN  COMPARISON:  04/19/2013  FINDINGS: Mediastinum: The heart size appears normal. There is no pericardial effusion identified. Mild calcified atherosclerotic disease involves the thoracic aorta. Diffuse diffuse AP narrowing of the intra-thoracic portion of the trachea compatible with chronic obstructive pulmonary disease. The esophagus appears normal. No abnormal filling defects noted to suggest acute pulmonary embolus.  Lungs/Pleura: No pleural effusion. There are moderate changes  of centrilobular emphysema. Scar versus atelectasis is noted in the right upper lobe. No airspace consolidation. No suspicious pulmonary nodule or mass identified.  Upper Abdomen: No acute findings identified within the limited portions of the upper abdomen.  Musculoskeletal: Review of the visualized osseous structures is unremarkable.  Review of the MIP images confirms the above findings.  IMPRESSION: 1. No evidence for acute pulmonary embolus. 2. COPD/emphysema.   Electronically Signed   By: Signa Kell M.D.   On: 03/22/2014 21:33   Dg Chest Port 1 View  03/22/2014   CLINICAL DATA:  Chest pain, shortness of breath.  EXAM: PORTABLE CHEST - 1 VIEW  COMPARISON:  August 10, 2013.  FINDINGS: The heart size and mediastinal contours are within normal limits. Both lungs are clear. No pneumothorax or pleural effusion is noted. 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  IMPRESSION: No acute cardiopulmonary abnormality seen.  Electronically Signed   By: Roque Lias M.D.   On: 03/22/2014 15:42    ASSESSMENT / PLAN:  Acute on chronic hypoxic respiratory failure R/o PE / DVT - CTA negative, LE dopplers pending. COPD without evidence of exacerbation - with asthmatic bronchitic component per Dr. [name] Plan: LE Dopplers to r/o DVT. DuoNebs / Albuterol. No role abx. Continue outpatient flonase, prednisone.  HTN Plan: Continue outpatient lopressor.  RA Plan: Continue outpatient prednisone and celebrex.  Chronic pain Anxiety Plan: Continue outpatient MS Contin. Hold outpatient PRN morphine, ativan.   Rutherford Guys, PA - [facility] Pgr: [phone]  or [phone]  Attending note- 63 year old with severe COPD on 2.5 L home oxygen, has outlived hospice, presented with sudden onset dyspnea for one day, exam consistent with decreased breath sounds bilaterally, no bronchospasm or evidence of heart failure, CT angiogram negative for  PE, she does have history for tracheostomy, and CT shows some narrowing of trachea. We'll treat as COPD flare with steroids and bronchodilators, continue morphine for respiratory discomfort. Review of prior PFTs shows FEV1 of 0.53 postbronchodilator.  Care during the described time interval was provided by me and/or other providers on the critical care team.  I have reviewed this patient's available data, including medical history, events of note, physical examination and test results as part of my evaluation    Oretha Milch. MD 03/22/2014, 10:54 PM

## 2014-03-22 NOTE — Progress Notes (Signed)
Pt came in  through ED from home via stretcher  Transferred to bed  with minimal assist . Rn introduce self and call light within reach  to call for hel[p

## 2014-03-23 ENCOUNTER — Encounter (HOSPITAL_COMMUNITY): Payer: Self-pay | Admitting: *Deleted

## 2014-03-23 DIAGNOSIS — R0602 Shortness of breath: Secondary | ICD-10-CM

## 2014-03-23 LAB — BASIC METABOLIC PANEL
ANION GAP: 15 (ref 5–15)
BUN: 22 mg/dL (ref 6–23)
CHLORIDE: 100 meq/L (ref 96–112)
CO2: 31 mEq/L (ref 19–32)
CREATININE: 0.95 mg/dL (ref 0.50–1.10)
Calcium: 9.7 mg/dL (ref 8.4–10.5)
GFR, EST AFRICAN AMERICAN: 72 mL/min — AB (ref >90)
GFR, EST NON AFRICAN AMERICAN: 62 mL/min — AB (ref >90)
Glucose, Bld: 136 mg/dL — ABNORMAL HIGH (ref 70–99)
Potassium: 3.2 mEq/L — ABNORMAL LOW (ref 3.7–5.3)
Sodium: 146 mEq/L (ref 137–147)

## 2014-03-23 LAB — CBC
HCT: 28.7 % — ABNORMAL LOW (ref 36.0–46.0)
Hemoglobin: 8.9 g/dL — ABNORMAL LOW (ref 12.0–15.0)
MCH: 23.9 pg — ABNORMAL LOW (ref 26.0–34.0)
MCHC: 31 g/dL (ref 30.0–36.0)
MCV: 77.2 fL — ABNORMAL LOW (ref 78.0–100.0)
PLATELETS: 206 10*3/uL (ref 150–400)
RBC: 3.72 MIL/uL — ABNORMAL LOW (ref 3.87–5.11)
RDW: 14.4 % (ref 11.5–15.5)
WBC: 15.4 10*3/uL — AB (ref 4.0–10.5)

## 2014-03-23 LAB — PHOSPHORUS: Phosphorus: 3.4 mg/dL (ref 2.3–4.6)

## 2014-03-23 LAB — MRSA PCR SCREENING: MRSA BY PCR: NEGATIVE

## 2014-03-23 LAB — MAGNESIUM: Magnesium: 1.9 mg/dL (ref 1.5–2.5)

## 2014-03-23 MED ORDER — INFLUENZA VAC SPLIT QUAD 0.5 ML IM SUSY
0.5000 mL | PREFILLED_SYRINGE | INTRAMUSCULAR | Status: AC
  Administered 2014-03-24: 0.5 mL via INTRAMUSCULAR
  Filled 2014-03-23 (×2): qty 0.5

## 2014-03-23 MED ORDER — PNEUMOCOCCAL VAC POLYVALENT 25 MCG/0.5ML IJ INJ: 0.5000 mL | INJECTION | INTRAMUSCULAR | Status: DC

## 2014-03-23 MED ORDER — LORAZEPAM 0.5 MG PO TABS
0.5000 mg | ORAL_TABLET | Freq: Once | ORAL | Status: AC
  Administered 2014-03-23: 0.5 mg via ORAL
  Filled 2014-03-23: qty 1

## 2014-03-23 NOTE — ED Provider Notes (Signed)
Medical screening examination/treatment/procedure(s) were performed by non-physician practitioner and as supervising physician I was immediately available for consultation/collaboration.   EKG Interpretation   Date/Time:  Wednesday March 22 2014 15:20:25 EDT Ventricular Rate:  99 PR Interval:  184 QRS Duration: 71 QT Interval:  350 QTC Calculation: 449 R Axis:   33 Text Interpretation:  Sinus rhythm Consider right atrial enlargement  Artifact in lead(s) I II III aVR aVL No significant change since last  tracing Confirmed by Anitra Lauth  MD, Alphonzo Lemmings ([id number]) on 03/22/2014 3:57:39  PM        Gwyneth Sprout, MD 03/23/14 0030

## 2014-03-23 NOTE — Progress Notes (Signed)
VASCULAR LAB PRELIMINARY  PRELIMINARY  PRELIMINARY  PRELIMINARY  Bilateral lower extremity venous Dopplers completed.    Preliminary report:  There is no DVT or SVT noted in the bilateral lower extremities.   Zanita Millman, RVT 03/23/2014, 10:42 AM

## 2014-03-23 NOTE — Progress Notes (Signed)
Utilization review completed.  

## 2014-03-23 NOTE — Progress Notes (Signed)
Patient states that "i am not in any kind of pain- the ms contin is for adhesions in my stomach." RN asked if there is any pain at all so that a pain scale can be added- patient states, "no, i am completely fine- that is only for my stomach." Pain medication is scheduled. Med given to patient.

## 2014-03-24 DIAGNOSIS — J9621 Acute and chronic respiratory failure with hypoxia: Secondary | ICD-10-CM | POA: Diagnosis not present

## 2014-03-24 DIAGNOSIS — R300 Dysuria: Secondary | ICD-10-CM

## 2014-03-24 DIAGNOSIS — J9620 Acute and chronic respiratory failure, unspecified whether with hypoxia or hypercapnia: Secondary | ICD-10-CM

## 2014-03-24 LAB — URINE MICROSCOPIC-ADD ON

## 2014-03-24 LAB — URINALYSIS, ROUTINE W REFLEX MICROSCOPIC
BILIRUBIN URINE: NEGATIVE
Glucose, UA: NEGATIVE mg/dL
Hgb urine dipstick: NEGATIVE
KETONES UR: NEGATIVE mg/dL
NITRITE: NEGATIVE
Protein, ur: NEGATIVE mg/dL
Specific Gravity, Urine: 1.029 (ref 1.005–1.030)
UROBILINOGEN UA: 0.2 mg/dL (ref 0.0–1.0)
pH: 6 (ref 5.0–8.0)

## 2014-03-24 MED ORDER — ENOXAPARIN SODIUM 40 MG/0.4ML ~~LOC~~ SOLN
40.0000 mg | SUBCUTANEOUS | Status: DC
  Administered 2014-03-24 – 2014-03-26 (×3): 40 mg via SUBCUTANEOUS
  Filled 2014-03-24 (×4): qty 0.4

## 2014-03-24 MED ORDER — BUDESONIDE 0.25 MG/2ML IN SUSP
0.2500 mg | Freq: Two times a day (BID) | RESPIRATORY_TRACT | Status: DC
Start: 2014-03-24 — End: 2014-03-27
  Administered 2014-03-24 – 2014-03-27 (×6): 0.25 mg via RESPIRATORY_TRACT
  Filled 2014-03-24 (×9): qty 2

## 2014-03-24 MED ORDER — LORAZEPAM 0.5 MG PO TABS: 0.5000 mg | ORAL_TABLET | ORAL | Status: DC | PRN

## 2014-03-24 NOTE — Progress Notes (Signed)
No distress Improving slowly but doesn't feel ready to go home yet Reports mild dysuria  Filed Vitals:   03/24/14 0127 03/24/14 0516 03/24/14 1029 03/24/14 1358  BP:  108/67 122/56 108/76  Pulse: 97 87  102  Temp:  98.5 F (36.9 C)  99.1 F (37.3 C)  TempSrc:  Oral  Oral  Resp: 18 18  18   Height:      Weight:  54.6 kg (120 lb 5.9 oz)    SpO2: 98% 100%  100%   NAD Slightly cushingoid features No JVD Diminished BS Minimal scattered wheezes RRR Soft, NT No edema   I have reviewed all of today's lab results. Relevant abnormalities are discussed in the A/P section  CXR: NNF   IMPRESSION: ES COPD Acute on chronic resp failure AECOPD Chronic steroids PE R/O'd by CTA chest DVT R/O'd by Venous duplex scanning Chronic pain Anxiety disorder Dysuria  PLAN: Cont supplemental O2 Cont nebulized BDs Cont systemic steroids Advance activity  PRN lorazepam for anxiety as per home regimen Cont MS Contin UA sent 10/30   11/30, MD ; [facility] service Mobile [phone].  After 5:30 PM or weekends, call [phone]

## 2014-03-24 NOTE — Care Management Note (Signed)
    Page 1 of 1   03/27/2014     11:49:51 AM CARE MANAGEMENT NOTE 03/27/2014  Patient:  [name],[name]   Account Number:  [id number]  Date Initiated:  03/24/2014  Documentation initiated by:  Letha Cape  Subjective/Objective Assessment:   dx dyspnea  admit- lives alone     Action/Plan:   Anticipated DC Date:  03/27/2014   Anticipated DC Plan:  HOME/SELF CARE      DC Planning Services  CM consult      Choice offered to / List presented to:             Status of service:  Completed, signed off Medicare Important Message given?  YES (If response is "NO", the following Medicare IM given date fields will be blank) Date Medicare IM given:  03/24/2014 Medicare IM given by:  Letha Cape Date Additional Medicare IM given:  03/27/2014 Additional Medicare IM given by:  Letha Cape  Discharge Disposition:  HOME/SELF CARE  Per UR Regulation:  Reviewed for med. necessity/level of care/duration of stay  If discussed at Long Length of Stay Meetings, dates discussed:    Comments:  03/27/14 Letha Cape RN, BSN [phone] patient is on home oxygen her sister will bring her oxygen tank for dc today.

## 2014-03-25 DIAGNOSIS — R0602 Shortness of breath: Secondary | ICD-10-CM

## 2014-03-25 NOTE — Progress Notes (Signed)
Ambulatory in room/ very hoarse On  2.5 lpm      Filed Vitals:   03/24/14 2120 03/24/14 2212 03/25/14 0515 03/25/14 0745  BP:  127/53 102/58   Pulse:  106 96 88  Temp:  98.6 F (37 C) 98.4 F (36.9 C)   TempSrc:  Oral Oral   Resp:  18 18 18   Height:      Weight:      SpO2: 99% 99% 100%    NAD Moderately  cushingoid features No JVD Diminished BS Minimal scattered exp wheezes RRR Soft, NT No edema    Recent Labs Lab 03/22/14 1516 03/22/14 2031 03/23/14 0635  NA 142  --  146  K 4.2  --  3.2*  CL 99  --  100  CO2 30  --  31  BUN 19  --  22  CREATININE 0.91 0.91 0.95  GLUCOSE 103*  --  136*    Recent Labs Lab 03/22/14 1516 03/22/14 2031 03/23/14 0635  HGB 9.6* 10.4* 8.9*  HCT 32.8* 34.5* 28.7*  WBC 9.8 9.7 15.4*  PLT 203 207 206     Lab Results  Component Value Date   TSH 0.120* 05/09/2013     Lab Results  Component Value Date   PROBNP 588.7* 03/22/2014     Lab Results  Component Value Date   ESRSEDRATE 137* 06/29/2008       CTa 03/22/14 1. No evidence for acute pulmonary embolus.  2. COPD/emphysema.   IMPRESSION: ES COPD Acute on chronic resp failure AECOPD Chronic steroids  DVT R/O'd by Venous duplex scanning Chronic pain Anxiety disorder Dysuria - u/a min wbcs    PLAN: Cont supplemental O2 Cont nebulized BDs Cont systemic steroids Advance activity  PRN lorazepam for anxiety as per home regimen Cont MS Contin     03/24/14, MD Pulmonary and Critical Care Medicine [facility] Cell [phone] After 5:30 PM or weekends, call [phone]

## 2014-03-26 DIAGNOSIS — R0600 Dyspnea, unspecified: Secondary | ICD-10-CM

## 2014-03-26 MED ORDER — IPRATROPIUM-ALBUTEROL 0.5-2.5 (3) MG/3ML IN SOLN
3.0000 mL | Freq: Three times a day (TID) | RESPIRATORY_TRACT | Status: DC
  Administered 2014-03-26 – 2014-03-27 (×5): 3 mL via RESPIRATORY_TRACT
  Filled 2014-03-26 (×5): qty 3

## 2014-03-26 MED ORDER — FAMOTIDINE 20 MG PO TABS
20.0000 mg | ORAL_TABLET | Freq: Two times a day (BID) | ORAL | Status: DC
  Administered 2014-03-26 – 2014-03-27 (×3): 20 mg via ORAL
  Filled 2014-03-26 (×4): qty 1

## 2014-03-26 NOTE — Plan of Care (Signed)
Problem: ICU Phase Progression Outcomes Goal: Dyspnea controlled at rest Outcome: Completed/Met Date Met:  03/26/14 Goal: Pain controlled with appropriate interventions Outcome: Completed/Met Date Met:  03/26/14 Goal: Other ICU Phase Outcomes/Goals Outcome: Not Applicable Date Met:  46/19/01

## 2014-03-26 NOTE — Plan of Care (Signed)
Problem: Discharge Progression Outcomes Goal: Tolerating diet Outcome: Completed/Met Date Met:  03/26/14

## 2014-03-26 NOTE — Plan of Care (Signed)
Problem: Discharge Progression Outcomes Goal: Dyspnea controlled Outcome: Progressing     

## 2014-03-26 NOTE — Progress Notes (Signed)
Ambulatory in room/ very hoarse On  2.5 lpm      Filed Vitals:   03/25/14 2010 03/25/14 2135 03/26/14 0618 03/26/14 0743  BP:  139/60 122/81   Pulse:  97 92   Temp:  99.1 F (37.3 C) 98.6 F (37 C)   TempSrc:  Oral Oral   Resp:  18 18   Height:      Weight:      SpO2: 99% 100% 100% 98%   NAD except very hoarse Moderately  cushingoid features No JVD Diminished BS, mostly pseudowheze  RRR Soft, NT No edema    Recent Labs Lab 03/22/14 1516 03/22/14 2031 03/23/14 0635  NA 142  --  146  K 4.2  --  3.2*  CL 99  --  100  CO2 30  --  31  BUN 19  --  22  CREATININE 0.91 0.91 0.95  GLUCOSE 103*  --  136*    Recent Labs Lab 03/22/14 1516 03/22/14 2031 03/23/14 0635  HGB 9.6* 10.4* 8.9*  HCT 32.8* 34.5* 28.7*  WBC 9.8 9.7 15.4*  PLT 203 207 206     Lab Results  Component Value Date   TSH 0.120* 05/09/2013     Lab Results  Component Value Date   PROBNP 588.7* 03/22/2014     Lab Results  Component Value Date   ESRSEDRATE 137* 06/29/2008       CTa 03/22/14 1. No evidence for acute pulmonary embolus.  2. COPD/emphysema.   IMPRESSION: ES COPD Acute on chronic resp failure AECOPD Chronic steroids  DVT R/O'd by Venous duplex scanning Chronic pain Anxiety disorder Dysuria - u/a min wbcs    PLAN: Cont supplemental O2 Cont nebulized BDs Cont systemic steroids Advance activity  PRN lorazepam for anxiety as per home regimen Cont MS Contin Added h2 blockers 11/1 for possible gerd/lpr component to hoarseness as intol of PPI    Sandrea Hughs, MD Pulmonary and Critical Care Medicine [facility] Cell [phone] After 5:30 PM or weekends, call [phone]

## 2014-03-27 ENCOUNTER — Telehealth: Payer: Self-pay | Admitting: Critical Care Medicine

## 2014-03-27 ENCOUNTER — Encounter (HOSPITAL_COMMUNITY): Payer: Self-pay | Admitting: *Deleted

## 2014-03-27 NOTE — Discharge Summary (Signed)
Physician Discharge Summary  Patient ID: [name] MRN: [id number] DOB/AGE: Sep 06, 1950 63 y.o.  Admit date: 03/22/2014 Discharge date: 03/27/2014  Problem List Active Problems:   Dyspnea  HPI: [name] is a 62 y.o. F with PMH as outlined below. She is followed by Dr. [name] as outpatient for COPD and is on 2.5L O2 24 hours/day at baseline. She reports that she was in her USOH up until around 3AM on 10/28 when she suddenly awoke from sleep with SOB. She states that she needed to use the restroom; however, was so SOB that she was unable to walk to restroom. She required assistance from family member and with help, was able to get to restroom; however, was profoundly SOB. She states that at baseline, she is able to walk around the house doing various chores. She occasionally gets SOB when she overdoes things; however, nothing as bad as this. She denies any recent travel, no personal or family hx of clotting disorders, no personal or family hx of malignancy, denies cough / hemoptysis. Denies any hx of PE or DVT. Does report that she has been less active than usual over the past week or so and has had periods of prolonged immobilization. Further, she endorses pain in her bilateral calves which she thought were just muscle cramps, R > L.  PFT's from 2011: FEV1 .40 pre / .53 post. FEV1/FVC 39 pre, 40 post. Pt also has hx of prior trach.  In ED, CXR was completely clear; however, pt remained hypoxic and required 3L O2 to maintain SpO2 > 93%. CTA chest was negative for PE. Hospital Course:  IMPRESSION: ES COPD Acute on chronic resp failure AECOPD Chronic steroids DVT R/O'd by Venous duplex scanning Chronic pain Anxiety disorder Dysuria - u/a min wbcs    PLAN: Cont supplemental O2 Cont nebulized BDs Cont systemic steroids Advance activity  PRN lorazepam for anxiety as per home regimen Cont MS Contin Added h2 blockers 11/1 for possible gerd/lpr component to  hoarseness as intol of PPI Follow up with NP/MD in the office as scheduled.     Labs at discharge Lab Results  Component Value Date   CREATININE 0.95 03/23/2014   BUN 22 03/23/2014   NA 146 03/23/2014   K 3.2* 03/23/2014   CL 100 03/23/2014   CO2 31 03/23/2014   Lab Results  Component Value Date   WBC 15.4* 03/23/2014   HGB 8.9* 03/23/2014   HCT 28.7* 03/23/2014   MCV 77.2* 03/23/2014   PLT 206 03/23/2014   Lab Results  Component Value Date   ALT 23 08/10/2013   AST 13 08/10/2013   ALKPHOS 68 08/10/2013   BILITOT 0.2* 08/10/2013   Lab Results  Component Value Date   INR 1.16 03/28/2013   INR 1.08 12/01/2011   INR 1.4 07/05/2008    Current radiology studies No results found.  Disposition:  01-Home or Self Care  Discharge Instructions    Discharge patient    Complete by:  As directed             Medication List    TAKE these medications        aspirin 81 MG tablet  Take 81 mg by mouth daily.     calcium citrate-vitamin D 315-200 MG-UNIT per tablet  Commonly known as:  CITRACAL+D  Take 1 tablet by mouth 2 (two) times daily.     celecoxib 200 MG capsule  Commonly known as:  CELEBREX  Take 200 mg by mouth daily.  doxycycline 100 MG tablet  Commonly known as:  VIBRA-TABS  Take 1 tablet by mouth daily.     famotidine 20 MG tablet  Commonly known as:  PEPCID  Take 20 mg by mouth 2 (two) times daily.     feeding supplement (PRO-STAT SUGAR FREE 64) Liqd  Take 30 mLs by mouth daily.     fluticasone 50 MCG/ACT nasal spray  Commonly known as:  FLONASE  Place 2 sprays into the nose 2 (two) times daily.     FLUTTER Devi  Inhale into the lungs. 2-4 times daily     furosemide 20 MG tablet  Commonly known as:  LASIX  Take 20 mg by mouth every other day.     ipratropium-albuterol 0.5-2.5 (3) MG/3ML Soln  Commonly known as:  DUONEB  Take 3 mLs by nebulization every 4 (four) hours as needed.     LORazepam 0.5 MG tablet  Commonly known as:   ATIVAN  1 tab by mouth every 4-6 hours as needed     metoprolol tartrate 25 MG tablet  Commonly known as:  LOPRESSOR  Take 0.5 tablets (12.5 mg total) by mouth 2 (two) times daily.     morphine 15 MG tablet  Commonly known as:  MSIR  Take 15 mg by mouth every 4 (four) hours as needed for severe pain.     morphine 30 MG 12 hr tablet  Commonly known as:  MS CONTIN  Take 1 tablet (30 mg total) by mouth every 12 (twelve) hours.     MUCINEX 600 MG 12 hr tablet  Generic drug:  guaiFENesin  TAKE 2 TABLETS TWICE A DAY     predniSONE 20 MG tablet  Commonly known as:  DELTASONE  Take 20 mg by mouth daily with breakfast.     PROAIR HFA 108 (90 BASE) MCG/ACT inhaler  Generic drug:  albuterol  Inhale 2 puffs into the lungs every 6 (six) hours as needed for wheezing or shortness of breath.          Discharged Condition: fair  Time spent on discharge greater than 40 minutes.  Vital signs at Discharge. Temp:  [98.3 F (36.8 C)-98.6 F (37 C)] 98.5 F (36.9 C) (11/02 0626) Pulse Rate:  [66-92] 79 (11/02 0626) Resp:  [16-19] 18 (11/02 0626) BP: (127-132)/(50-57) 127/55 mmHg (11/02 0626) SpO2:  [95 %-100 %] 96 % (11/02 0817) Office follow up Special Information or instructions.  She has a follow up with Tammy Parrett ANP-BC. She needs forms for scooter filled out. Sent word to L. Ottinger for work on this further she needs form filled out for emergency call bell at her [facility].   Signed: Brett Canales Minor ACNP [facility] PCCM Pager [phone] till 3 pm If no answer page [phone] 03/27/2014, 11:47 AM    I have seen and examined this patient with the mid-level provider and agree with the above note . My assessment is to discharge this pt on a steroid taper and neb meds and oxygen for copd exacerbation with short term followup.    Dorcas Carrow  Beeper  [phone]  Cell  [phone]  If no response or cell goes to voicemail, call beeper [phone]

## 2014-03-27 NOTE — Plan of Care (Signed)
Problem: ICU Phase Progression Outcomes Goal: Hemodynamically stable Outcome: Progressing     

## 2014-03-27 NOTE — Telephone Encounter (Signed)
Called, spoke with pt -  She is staying at [facility].  Reports her room is wired for an emergency pull cord but the actual cord has not yet been installed.  Pt reports [facility] will need a form signed by Dr. [name] to complete the installation.  Requesting status.  Advised I have not seen form but would contact [facility] to obtain.  Spoke with Ms. Neva Seat at [phone] with [facility] who reports she mailed the form last wk to our office.  She will fax this to triage fax # today.  Will await fax.

## 2014-03-28 NOTE — Telephone Encounter (Signed)
Spoke with Crystal.  Form was received but not sure what PW needs to do with form.  Crystal to contact [facility] regarding form.

## 2014-03-29 NOTE — Telephone Encounter (Signed)
[employer] Accommod[employer] Verification Form received.  Will have PW address next wk when he returns to office. [email or web address] for pt to update her on status.

## 2014-03-29 NOTE — Telephone Encounter (Signed)
Spoke with [name].  She will refax form to triage.  Will await fax.

## 2014-03-30 NOTE — Telephone Encounter (Signed)
Form completed by Dr. [name] and faxed to Ms. Neva Seat.   Pt and Ms. Neva Seat aware.

## 2014-04-04 ENCOUNTER — Ambulatory Visit (INDEPENDENT_AMBULATORY_CARE_PROVIDER_SITE_OTHER): Payer: Medicare Other | Admitting: Adult Health

## 2014-04-04 ENCOUNTER — Encounter: Payer: Self-pay | Admitting: Adult Health

## 2014-04-04 VITALS — BP 122/86 | HR 89 | Temp 97.0°F | Ht 61.0 in | Wt 120.8 lb

## 2014-04-04 DIAGNOSIS — J9610 Chronic respiratory failure, unspecified whether with hypoxia or hypercapnia: Secondary | ICD-10-CM

## 2014-04-04 DIAGNOSIS — E118 Type 2 diabetes mellitus with unspecified complications: Secondary | ICD-10-CM

## 2014-04-04 DIAGNOSIS — J438 Other emphysema: Secondary | ICD-10-CM

## 2014-04-04 MED ORDER — LORAZEPAM 0.5 MG PO TABS: ORAL_TABLET | ORAL | Status: DC

## 2014-04-04 MED ORDER — MORPHINE SULFATE ER 30 MG PO TBCR: 30.0000 mg | EXTENDED_RELEASE_TABLET | Freq: Two times a day (BID) | ORAL | Status: DC

## 2014-04-04 NOTE — Patient Instructions (Signed)
Continue on current meds  Follow up Dr. [name]  In 2 months

## 2014-04-04 NOTE — Progress Notes (Signed)
   Subjective:    Patient ID: [name], female    DOB: 15-May-1951, 63 y.o.   MRN: [id number]  HPI 63 y.o.[name]  woman with severe COPD Golds Stage D  04/04/2014 Post Hospital follow up  Returns for post hospital follow up  Was admitted recently for COPD flare  CT chest neg for PE , COPD changes  Tx with prednisone taper down to baseline 20mg  daily .  Feels she is much better, back to baseline Eating well. No flare in dyspnea or cough.  Immunization with PVX and flu and Prevnar are utd.   Has chronic pain syndrome on chronic narcotic w/ Morphine.  Pain controlled without flare   Has chronic hypoxic resp failrue on O2 2.5l/m  No problems with O2 .  Sats adequate on current regimen .   Has depression /anxiety , controlled with lorazepam.  Feels all the rain lately is making her worse.  Today cheerful and laughing.  No suidcidal ideations.   Has DM on chronic steroids  Says fasting sugars are well controlled on current regimen  Followed by PCP .     Review of Systems  Constitutional:   No  weight loss, night sweats,  Fevers, chills,  +fatigue, or  lassitude.  HEENT:   No headaches,  Difficulty swallowing,  Tooth/dental problems, or  Sore throat,                No sneezing, itching, ear ache, nasal congestion, post nasal drip,   CV:  No chest pain,  Orthopnea, PND, swelling in lower extremities, anasarca, dizziness, palpitations, syncope.   GI  No heartburn, indigestion, abdominal pain, nausea, vomiting, diarrhea, change in bowel habits, loss of appetite, bloody stools.   Resp:   No excess mucus, no productive cough,  No non-productive cough,  No coughing up of blood.  No change in color of mucus.  No wheezing.  No chest wall deformity  Skin: no rash or lesions.  GU: no dysuria, change in color of urine, no urgency or frequency.  No flank pain, no hematuria   MS:  No joint pain or swelling.  No decreased range of motion.  No back pain.  Psych:  No change in mood or  affect.  + depression or anxiety.   No memory loss.          Objective:   Physical Exam GEN: A/Ox3; pleasant , NAD, looks well , cheerful   HEENT:  [place]/AT,  EACs-clear, TMs-wnl, NOSE-clear, THROAT-clear, no lesions, no postnasal drip or exudate noted.   NECK:  Supple w/ fair ROM; no JVD; normal carotid impulses w/o bruits; no thyromegaly or nodules palpated; no lymphadenopathy.  RESP  Decreased BS in bases  w/o, wheezes/ rales/ or rhonchi.no accessory muscle use, no dullness to percussion  CARD:  RRR, no m/r/g  , no peripheral edema, pulses intact, no cyanosis or clubbing.  GI:   Soft & nt; nml bowel sounds; no organomegaly or masses detected.  Musco: Warm bil, no deformities or joint swelling noted.   Neuro: alert, no focal deficits noted.    Skin: Warm, no lesions or rashes         Assessment & Plan:

## 2014-04-04 NOTE — Assessment & Plan Note (Signed)
Recent flare -now resolved  Back to baseline  Cont on current regimen

## 2014-04-04 NOTE — Assessment & Plan Note (Signed)
Fasting sugars good at home on chronic steroids  follwed by PCP

## 2014-04-04 NOTE — Addendum Note (Signed)
Addended by: Boone Master E on: 04/04/2014 11:29 AM   Modules accepted: Orders

## 2014-04-04 NOTE — Assessment & Plan Note (Signed)
Compensated on O2  Cont on current regimen

## 2014-04-05 ENCOUNTER — Telehealth: Payer: Self-pay | Admitting: Critical Care Medicine

## 2014-04-05 NOTE — Telephone Encounter (Signed)
Called [name] and spoke with Marisue Ivan who is checking on a fax that was sent on 11/5 regarding Medicare billing for pt's O2 Asked Marisue Ivan to please refax to triage - attn Crystal Will hold in triage to await fax

## 2014-04-06 ENCOUNTER — Telehealth: Payer: Self-pay | Admitting: Critical Care Medicine

## 2014-04-06 MED ORDER — ARFORMOTEROL TARTRATE 15 MCG/2ML IN NEBU: 15.0000 ug | INHALATION_SOLUTION | Freq: Two times a day (BID) | RESPIRATORY_TRACT | Status: DC

## 2014-04-06 MED ORDER — BUDESONIDE 0.25 MG/2ML IN SUSP: 0.2500 mg | Freq: Two times a day (BID) | RESPIRATORY_TRACT | Status: DC

## 2014-04-06 NOTE — Telephone Encounter (Signed)
Spoke with [facility] and they verified that they did receive form.  Nothing further needed.

## 2014-04-06 NOTE — Telephone Encounter (Signed)
Attempted to call [facility] back but the are closed.  Will try back later.

## 2014-04-06 NOTE — Telephone Encounter (Signed)
Received refill request for brovana and budesonide for this pt and this was signed by PW and faxed back to the [facility] pharmacy.  Nothing further is needed.

## 2014-04-06 NOTE — Telephone Encounter (Signed)
Form already received and signed by Dr. [name].  It has been faxed to [phone]. ATC [facility] to advise of above.  They are currently closed - open at 7 am Pacific Time.  Unable to leave msg bc mailbox is full. Will need to call back later.

## 2014-04-13 ENCOUNTER — Other Ambulatory Visit: Payer: Self-pay | Admitting: Critical Care Medicine

## 2014-04-14 NOTE — Telephone Encounter (Signed)
Received refill request for Prostat and Flonase.  Per 02/01/14 phone msg:    [name] Frisk, MD at 02/10/2014 11:44 AM     Status: Signed       Expand All Collapse All   i am ok with prostat supp refill  Advise the pt she MUST : 1. Get appt with her GI MD again 2. REconnect with her primary care MD       -----  Spoke with pt.  She has an appt pending with PCP on May 10, 2014, but "forgot" she needed to schedule an appt with GI.  Discussed with pt that I would send Prostat Rx but to please discuss this with PCP and obtain a GI appt per PW's recs and discuss with GI MD as well.  She verbalized understanding, is in agreement with plan, and voiced no further questions or concerns at this time.

## 2014-04-19 ENCOUNTER — Other Ambulatory Visit: Payer: Self-pay | Admitting: *Deleted

## 2014-04-19 MED ORDER — IPRATROPIUM-ALBUTEROL 0.5-2.5 (3) MG/3ML IN SOLN: 3.0000 mL | RESPIRATORY_TRACT | Status: AC | PRN

## 2014-04-22 ENCOUNTER — Emergency Department (HOSPITAL_COMMUNITY): Payer: Medicare Other

## 2014-04-22 ENCOUNTER — Inpatient Hospital Stay (HOSPITAL_COMMUNITY): Payer: Medicare Other

## 2014-04-22 ENCOUNTER — Inpatient Hospital Stay (HOSPITAL_COMMUNITY)
Admission: EM | Admit: 2014-04-22 | Discharge: 2014-04-28 | DRG: 189 | Disposition: A | Discharge status: Hospice | Payer: Medicare Other | Attending: Critical Care Medicine | Admitting: Critical Care Medicine

## 2014-04-22 ENCOUNTER — Encounter (HOSPITAL_COMMUNITY): Payer: Self-pay

## 2014-04-22 DIAGNOSIS — G8929 Other chronic pain: Secondary | ICD-10-CM | POA: Diagnosis present

## 2014-04-22 DIAGNOSIS — Z9981 Dependence on supplemental oxygen: Secondary | ICD-10-CM | POA: Diagnosis not present

## 2014-04-22 DIAGNOSIS — I10 Essential (primary) hypertension: Secondary | ICD-10-CM | POA: Diagnosis present

## 2014-04-22 DIAGNOSIS — Z888 Allergy status to other drugs, medicaments and biological substances status: Secondary | ICD-10-CM | POA: Diagnosis not present

## 2014-04-22 DIAGNOSIS — R0602 Shortness of breath: Secondary | ICD-10-CM

## 2014-04-22 DIAGNOSIS — M810 Age-related osteoporosis without current pathological fracture: Secondary | ICD-10-CM | POA: Diagnosis present

## 2014-04-22 DIAGNOSIS — E785 Hyperlipidemia, unspecified: Secondary | ICD-10-CM | POA: Diagnosis present

## 2014-04-22 DIAGNOSIS — Z91013 Allergy to seafood: Secondary | ICD-10-CM

## 2014-04-22 DIAGNOSIS — J45909 Unspecified asthma, uncomplicated: Secondary | ICD-10-CM | POA: Diagnosis present

## 2014-04-22 DIAGNOSIS — Z79899 Other long term (current) drug therapy: Secondary | ICD-10-CM

## 2014-04-22 DIAGNOSIS — Z91010 Allergy to peanuts: Secondary | ICD-10-CM | POA: Diagnosis not present

## 2014-04-22 DIAGNOSIS — J383 Other diseases of vocal cords: Secondary | ICD-10-CM | POA: Diagnosis present

## 2014-04-22 DIAGNOSIS — J449 Chronic obstructive pulmonary disease, unspecified: Secondary | ICD-10-CM

## 2014-04-22 DIAGNOSIS — Z7952 Long term (current) use of systemic steroids: Secondary | ICD-10-CM | POA: Diagnosis not present

## 2014-04-22 DIAGNOSIS — J9690 Respiratory failure, unspecified, unspecified whether with hypoxia or hypercapnia: Secondary | ICD-10-CM

## 2014-04-22 DIAGNOSIS — J9620 Acute and chronic respiratory failure, unspecified whether with hypoxia or hypercapnia: Secondary | ICD-10-CM | POA: Diagnosis not present

## 2014-04-22 DIAGNOSIS — Z66 Do not resuscitate: Secondary | ICD-10-CM | POA: Diagnosis present

## 2014-04-22 DIAGNOSIS — R109 Unspecified abdominal pain: Secondary | ICD-10-CM | POA: Insufficient documentation

## 2014-04-22 DIAGNOSIS — F418 Other specified anxiety disorders: Secondary | ICD-10-CM | POA: Diagnosis present

## 2014-04-22 DIAGNOSIS — J438 Other emphysema: Secondary | ICD-10-CM | POA: Diagnosis not present

## 2014-04-22 DIAGNOSIS — J9622 Acute and chronic respiratory failure with hypercapnia: Principal | ICD-10-CM | POA: Diagnosis present

## 2014-04-22 DIAGNOSIS — Z87891 Personal history of nicotine dependence: Secondary | ICD-10-CM

## 2014-04-22 DIAGNOSIS — J441 Chronic obstructive pulmonary disease with (acute) exacerbation: Secondary | ICD-10-CM | POA: Diagnosis present

## 2014-04-22 DIAGNOSIS — J439 Emphysema, unspecified: Secondary | ICD-10-CM | POA: Diagnosis present

## 2014-04-22 DIAGNOSIS — Z7982 Long term (current) use of aspirin: Secondary | ICD-10-CM | POA: Diagnosis not present

## 2014-04-22 DIAGNOSIS — J9610 Chronic respiratory failure, unspecified whether with hypoxia or hypercapnia: Secondary | ICD-10-CM | POA: Diagnosis present

## 2014-04-22 DIAGNOSIS — J9621 Acute and chronic respiratory failure with hypoxia: Secondary | ICD-10-CM | POA: Diagnosis present

## 2014-04-22 DIAGNOSIS — K5090 Crohn's disease, unspecified, without complications: Secondary | ICD-10-CM | POA: Diagnosis present

## 2014-04-22 DIAGNOSIS — R630 Anorexia: Secondary | ICD-10-CM | POA: Diagnosis present

## 2014-04-22 DIAGNOSIS — K219 Gastro-esophageal reflux disease without esophagitis: Secondary | ICD-10-CM | POA: Diagnosis present

## 2014-04-22 DIAGNOSIS — F410 Panic disorder [episodic paroxysmal anxiety] without agoraphobia: Secondary | ICD-10-CM | POA: Diagnosis present

## 2014-04-22 DIAGNOSIS — G9340 Encephalopathy, unspecified: Secondary | ICD-10-CM | POA: Diagnosis present

## 2014-04-22 DIAGNOSIS — Z515 Encounter for palliative care: Secondary | ICD-10-CM

## 2014-04-22 DIAGNOSIS — J9692 Respiratory failure, unspecified with hypercapnia: Secondary | ICD-10-CM

## 2014-04-22 DIAGNOSIS — G9349 Other encephalopathy: Secondary | ICD-10-CM | POA: Diagnosis not present

## 2014-04-22 DIAGNOSIS — E119 Type 2 diabetes mellitus without complications: Secondary | ICD-10-CM | POA: Diagnosis present

## 2014-04-22 DIAGNOSIS — R52 Pain, unspecified: Secondary | ICD-10-CM

## 2014-04-22 DIAGNOSIS — R0600 Dyspnea, unspecified: Secondary | ICD-10-CM | POA: Insufficient documentation

## 2014-04-22 DIAGNOSIS — R0689 Other abnormalities of breathing: Secondary | ICD-10-CM

## 2014-04-22 LAB — I-STAT ARTERIAL BLOOD GAS, ED
Acid-Base Excess: 5 mmol/L — ABNORMAL HIGH (ref 0.0–2.0)
BICARBONATE: 32.8 meq/L — AB (ref 20.0–24.0)
O2 SAT: 94 %
TCO2: 35 mmol/L (ref 0–100)
pCO2 arterial: 67.4 mmHg (ref 35.0–45.0)
pH, Arterial: 7.295 — ABNORMAL LOW (ref 7.350–7.450)
pO2, Arterial: 80 mmHg (ref 80.0–100.0)

## 2014-04-22 LAB — COMPREHENSIVE METABOLIC PANEL
ALBUMIN: 3 g/dL — AB (ref 3.5–5.2)
ALT: 13 U/L (ref 0–35)
ANION GAP: 11 (ref 5–15)
AST: 15 U/L (ref 0–37)
Alkaline Phosphatase: 63 U/L (ref 39–117)
BUN: 13 mg/dL (ref 6–23)
CALCIUM: 8.3 mg/dL — AB (ref 8.4–10.5)
CO2: 28 mEq/L (ref 19–32)
CREATININE: 0.85 mg/dL (ref 0.50–1.10)
Chloride: 106 mEq/L (ref 96–112)
GFR calc Af Amer: 82 mL/min — ABNORMAL LOW (ref >90)
GFR calc non Af Amer: 71 mL/min — ABNORMAL LOW (ref >90)
Glucose, Bld: 105 mg/dL — ABNORMAL HIGH (ref 70–99)
Potassium: 3.7 mEq/L (ref 3.7–5.3)
Sodium: 145 mEq/L (ref 137–147)
TOTAL PROTEIN: 6 g/dL (ref 6.0–8.3)
Total Bilirubin: <0.2 mg/dL — ABNORMAL LOW (ref 0.3–1.2)

## 2014-04-22 LAB — CBC WITH DIFFERENTIAL/PLATELET
Basophils Absolute: 0 10*3/uL (ref 0.0–0.1)
Basophils Relative: 0 % (ref 0–1)
EOS ABS: 0 10*3/uL (ref 0.0–0.7)
EOS PCT: 0 % (ref 0–5)
HCT: 32.8 % — ABNORMAL LOW (ref 36.0–46.0)
Hemoglobin: 9.3 g/dL — ABNORMAL LOW (ref 12.0–15.0)
LYMPHS ABS: 0.2 10*3/uL — AB (ref 0.7–4.0)
Lymphocytes Relative: 2 % — ABNORMAL LOW (ref 12–46)
MCH: 23.3 pg — AB (ref 26.0–34.0)
MCHC: 28.4 g/dL — ABNORMAL LOW (ref 30.0–36.0)
MCV: 82.2 fL (ref 78.0–100.0)
Monocytes Absolute: 0.1 10*3/uL (ref 0.1–1.0)
Monocytes Relative: 1 % — ABNORMAL LOW (ref 3–12)
Neutro Abs: 9.3 10*3/uL — ABNORMAL HIGH (ref 1.7–7.7)
Neutrophils Relative %: 97 % — ABNORMAL HIGH (ref 43–77)
Platelets: 166 10*3/uL (ref 150–400)
RBC: 3.99 MIL/uL (ref 3.87–5.11)
RDW: 14.3 % (ref 11.5–15.5)
WBC: 9.6 10*3/uL (ref 4.0–10.5)

## 2014-04-22 LAB — MRSA PCR SCREENING: MRSA BY PCR: NEGATIVE

## 2014-04-22 LAB — AMYLASE: Amylase: 97 U/L (ref 0–105)

## 2014-04-22 LAB — BASIC METABOLIC PANEL
Anion gap: 10 (ref 5–15)
BUN: 13 mg/dL (ref 6–23)
CALCIUM: 8.4 mg/dL (ref 8.4–10.5)
CO2: 29 meq/L (ref 19–32)
CREATININE: 0.87 mg/dL (ref 0.50–1.10)
Chloride: 106 mEq/L (ref 96–112)
GFR calc Af Amer: 80 mL/min — ABNORMAL LOW (ref >90)
GFR calc non Af Amer: 69 mL/min — ABNORMAL LOW (ref >90)
GLUCOSE: 104 mg/dL — AB (ref 70–99)
Potassium: 3.7 mEq/L (ref 3.7–5.3)
SODIUM: 145 meq/L (ref 137–147)

## 2014-04-22 LAB — GLUCOSE, CAPILLARY: Glucose-Capillary: 82 mg/dL (ref 70–99)

## 2014-04-22 LAB — BLOOD GAS, ARTERIAL
ACID-BASE DEFICIT: 1.2 mmol/L (ref 0.0–2.0)
Bicarbonate: 27.8 mEq/L — ABNORMAL HIGH (ref 20.0–24.0)
DELIVERY SYSTEMS: POSITIVE
Drawn by: 246861
Expiratory PAP: 5
FIO2: 0.3 %
INSPIRATORY PAP: 15
O2 Saturation: 97.9 %
PCO2 ART: 94.7 mmHg — AB (ref 35.0–45.0)
PH ART: 7.095 — AB (ref 7.350–7.450)
Patient temperature: 98.6
TCO2: 30.7 mmol/L (ref 0–100)
pO2, Arterial: 117 mmHg — ABNORMAL HIGH (ref 80.0–100.0)

## 2014-04-22 LAB — MAGNESIUM: MAGNESIUM: 1.9 mg/dL (ref 1.5–2.5)

## 2014-04-22 LAB — PHOSPHORUS: Phosphorus: 2.6 mg/dL (ref 2.3–4.6)

## 2014-04-22 LAB — LACTIC ACID, PLASMA: Lactic Acid, Venous: 1.1 mmol/L (ref 0.5–2.2)

## 2014-04-22 LAB — PROCALCITONIN: Procalcitonin: 1.51 ng/mL

## 2014-04-22 LAB — LIPASE, BLOOD: LIPASE: 22 U/L (ref 11–59)

## 2014-04-22 MED ORDER — POTASSIUM CHLORIDE 2 MEQ/ML IV SOLN
INTRAVENOUS | Status: DC
  Administered 2014-04-22 – 2014-04-23 (×3): via INTRAVENOUS
  Filled 2014-04-22 (×5): qty 1000

## 2014-04-22 MED ORDER — ALBUTEROL SULFATE (2.5 MG/3ML) 0.083% IN NEBU: 2.5000 mg | INHALATION_SOLUTION | RESPIRATORY_TRACT | Status: DC | PRN

## 2014-04-22 MED ORDER — CETYLPYRIDINIUM CHLORIDE 0.05 % MT LIQD
7.0000 mL | Freq: Two times a day (BID) | OROMUCOSAL | Status: DC
  Administered 2014-04-23 – 2014-04-26 (×7): 7 mL via OROMUCOSAL

## 2014-04-22 MED ORDER — IPRATROPIUM-ALBUTEROL 0.5-2.5 (3) MG/3ML IN SOLN
RESPIRATORY_TRACT | Status: AC
  Administered 2014-04-22: 13:00:00
  Filled 2014-04-22: qty 3

## 2014-04-22 MED ORDER — SODIUM CHLORIDE 0.9 % IV SOLN
INTRAVENOUS | Status: DC
  Administered 2014-04-22: 14:00:00 via INTRAVENOUS

## 2014-04-22 MED ORDER — PANTOPRAZOLE SODIUM 40 MG IV SOLR
40.0000 mg | Freq: Every day | INTRAVENOUS | Status: DC
  Administered 2014-04-22: 40 mg via INTRAVENOUS
  Filled 2014-04-22 (×2): qty 40

## 2014-04-22 MED ORDER — BUDESONIDE 0.25 MG/2ML IN SUSP
0.2500 mg | Freq: Two times a day (BID) | RESPIRATORY_TRACT | Status: DC
Start: 2014-04-22 — End: 2014-04-23
  Administered 2014-04-22 – 2014-04-23 (×2): 0.25 mg via RESPIRATORY_TRACT
  Filled 2014-04-22 (×3): qty 2

## 2014-04-22 MED ORDER — ACETAMINOPHEN 325 MG PO TABS
650.0000 mg | ORAL_TABLET | Freq: Once | ORAL | Status: DC
  Filled 2014-04-22: qty 2

## 2014-04-22 MED ORDER — CHLORHEXIDINE GLUCONATE 0.12 % MT SOLN
15.0000 mL | Freq: Two times a day (BID) | OROMUCOSAL | Status: DC
  Administered 2014-04-22 – 2014-04-27 (×6): 15 mL via OROMUCOSAL
  Filled 2014-04-22 (×7): qty 15

## 2014-04-22 MED ORDER — DEXTROSE 5 % IV SOLN
1.0000 g | Freq: Two times a day (BID) | INTRAVENOUS | Status: DC
  Administered 2014-04-23 – 2014-04-27 (×9): 1 g via INTRAVENOUS
  Filled 2014-04-22 (×10): qty 1

## 2014-04-22 MED ORDER — METHYLPREDNISOLONE SODIUM SUCC 40 MG IJ SOLR
40.0000 mg | Freq: Two times a day (BID) | INTRAMUSCULAR | Status: DC
  Administered 2014-04-22 – 2014-04-24 (×4): 40 mg via INTRAVENOUS
  Filled 2014-04-22 (×6): qty 1

## 2014-04-22 MED ORDER — ASPIRIN EC 81 MG PO TBEC
81.0000 mg | DELAYED_RELEASE_TABLET | Freq: Every day | ORAL | Status: DC
  Administered 2014-04-23 – 2014-04-24 (×2): 81 mg via ORAL
  Filled 2014-04-22 (×5): qty 1

## 2014-04-22 MED ORDER — HEPARIN SODIUM (PORCINE) 5000 UNIT/ML IJ SOLN
5000.0000 [IU] | Freq: Three times a day (TID) | INTRAMUSCULAR | Status: DC
  Administered 2014-04-22 – 2014-04-23 (×3): 5000 [IU] via SUBCUTANEOUS
  Filled 2014-04-22 (×5): qty 1

## 2014-04-22 MED ORDER — INSULIN ASPART 100 UNIT/ML ~~LOC~~ SOLN: 2.0000 [IU] | SUBCUTANEOUS | Status: DC

## 2014-04-22 MED ORDER — DIPHENHYDRAMINE HCL 50 MG/ML IJ SOLN: 12.5000 mg | Freq: Once | INTRAMUSCULAR | Status: DC

## 2014-04-22 MED ORDER — IPRATROPIUM-ALBUTEROL 0.5-2.5 (3) MG/3ML IN SOLN
3.0000 mL | Freq: Four times a day (QID) | RESPIRATORY_TRACT | Status: DC
  Administered 2014-04-22 – 2014-04-24 (×8): 3 mL via RESPIRATORY_TRACT
  Filled 2014-04-22 (×8): qty 3

## 2014-04-22 MED ORDER — ONDANSETRON HCL 4 MG/2ML IJ SOLN: 4.0000 mg | Freq: Four times a day (QID) | INTRAMUSCULAR | Status: DC | PRN

## 2014-04-22 MED ORDER — SODIUM CHLORIDE 0.9 % IV SOLN
250.0000 mL | INTRAVENOUS | Status: DC | PRN
  Administered 2014-04-27: 250 mL via INTRAVENOUS

## 2014-04-22 MED ORDER — RACEPINEPHRINE HCL 2.25 % IN NEBU
INHALATION_SOLUTION | RESPIRATORY_TRACT | Status: AC
  Administered 2014-04-22: 0.5 mL
  Filled 2014-04-22: qty 0.5

## 2014-04-22 MED ORDER — DEXTROSE 5 % IV SOLN
1.0000 g | Freq: Once | INTRAVENOUS | Status: AC
  Administered 2014-04-22: 1 g via INTRAVENOUS
  Filled 2014-04-22 (×2): qty 1

## 2014-04-22 MED ORDER — IPRATROPIUM-ALBUTEROL 0.5-2.5 (3) MG/3ML IN SOLN
3.0000 mL | RESPIRATORY_TRACT | Status: DC
Start: 2014-04-22 — End: 2014-04-22
  Administered 2014-04-22: 3 mL via RESPIRATORY_TRACT

## 2014-04-22 NOTE — ED Notes (Signed)
Critical care is updating pt.s sister on plan of care.

## 2014-04-22 NOTE — ED Notes (Addendum)
Pt. Woke up with sob approximately at 0700 am, and has progressively became worse.  Pt. Arrived on cpap and received albuterol treatments, 5 mg  in route.  Pt. Also received Solumedrol 125mg  IV and Zofran 4mg  IV  .   Pt. Using accessory muscles and having lower abdominal pain.  Pt. Took 10mg   Albuterol treatment prior to EMS arrival. ST on the monitor.  Pt.'s LOC is diminished

## 2014-04-22 NOTE — H&P (Signed)
PULMONARY / CRITICAL CARE MEDICINE   Name: [name] MRN: [id number] DOB: 1951-05-22    ADMISSION DATE:  04/22/2014 CONSULTATION DATE:  11/28  REFERRING MD :  Radford Pax   CHIEF COMPLAINT:  Acute on chronic resp failure   INITIAL PRESENTATION:  This is a 63 year old female w/ h/o Chronic respiratory failure in the setting of ES COPD. Admitted on 11/28 w/ working dx of abd pain and AECOPD w resultant acute on chronic resp failure.   STUDIES:  Flat plate abd 01/00>>>  SIGNIFICANT EVENTS:    HISTORY OF PRESENT ILLNESS:   This is a 63 year old female f/b Dr [name] in the out pt setting for ES COPD and chronic resp failure. She was in her usual state of health until 11/26 when she developed worsening cough that evening. This was associated / some increased shortness of breath so she called her primary MD who phoned in a prescription for azithro on 11/27. In the am of 11/28 she was calling out to her sister complaining of sharp cramping abd pain. She did vomit a small amount of clear fluid once. Her sister reports that there has been no changes in her ostomy out-put. Her breathing go progressively worse and by the time EMS arrived she was unresponsive. On arrival to the ER she was unresponsive on BIPAP. Initial ABG w PCO2 94.7. PCCM asked to admit.   PAST MEDICAL HISTORY :   has a past medical history of Other diseases of vocal cords; Osteoporosis, unspecified; Other and unspecified hyperlipidemia; Esophageal reflux; Chronic airway obstruction, not elsewhere classified; Regional enteritis of unspecified site; Obstructive chronic bronchitis without exacerbation; Crohn's disease; Pelvic adhesions; Anxiety; Depression; Hypertension; Emphysema; Hematuria; Severe cervical dysplasia (age 64); Pneumonia; Asthmatic bronchitis , chronic; Exertional shortness of breath; On home oxygen therapy; Type II diabetes mellitus; Anemia; History of blood transfusion; Rheumatoid arthritis(714.0) (03/2012); Small  bowel obstruction due to adhesions (2014); and Pelvic abscess in female (2014).  has past surgical history that includes Ileostomy (2007); Ear Cyst Excision (Bilateral, 1970's); Cervical cone biopsy (1978); Colon surgery (2007); Esophagogastroduodenoscopy (egd) with esophageal dilation; Oophorectomy; Tracheostomy (2010); Hammer toe surgery (Bilateral, 1980's); Balloon dilation (02/10/2012); Colposcopy; Flexible sigmoidoscopy (03/26/2012); Cardiac catheterization (07/08/2007); nm myocar perf wall motion (09/21/2006); Ventral hernia repair (N/A, 03/16/2013); laparotomy (N/A, 03/16/2013); Lysis of adhesion (N/A, 03/16/2013); Insertion of mesh (N/A, 03/16/2013); TEE without cardioversion (N/A, 03/21/2013); Vaginal hysterectomy (1993); Tracheostomy closure (2010); and Hernia repair. Prior to Admission medications   Medication Sig Start Date End Date Taking? Authorizing Provider  Amino Acids-Protein Hydrolys (PRO-STAT) LIQD TAKE 30 MLS BY MOUTH DAILY. 04/14/14   Storm Frisk, MD  arformoterol (BROVANA) 15 MCG/2ML NEBU Take 2 mLs (15 mcg total) by nebulization 2 (two) times daily. 04/06/14   Storm Frisk, MD  aspirin 81 MG tablet Take 81 mg by mouth daily.     Historical Provider, MD  budesonide (PULMICORT) 0.25 MG/2ML nebulizer solution Take 2 mLs (0.25 mg total) by nebulization 2 (two) times daily. 04/06/14   Storm Frisk, MD  calcium citrate-vitamin D (CITRACAL+D) 315-200 MG-UNIT per tablet Take 1 tablet by mouth 2 (two) times daily.     Historical Provider, MD  celecoxib (CELEBREX) 200 MG capsule Take 200 mg by mouth daily.    Historical Provider, MD  famotidine (PEPCID) 20 MG tablet Take 20 mg by mouth 2 (two) times daily.    Historical Provider, MD  fluticasone (FLONASE) 50 MCG/ACT nasal spray Place 2 sprays into the nose 2 (two) times  daily.     Historical Provider, MD  fluticasone (FLONASE) 50 MCG/ACT nasal spray Place 2 sprays into both nostrils 2 (two) times daily. 04/14/14   Storm Frisk,  MD  furosemide (LASIX) 20 MG tablet Take 20 mg by mouth daily as needed.     Historical Provider, MD  ipratropium-albuterol (DUONEB) 0.5-2.5 (3) MG/3ML SOLN Take 3 mLs by nebulization every 4 (four) hours as needed. 04/19/14   Storm Frisk, MD  LORazepam (ATIVAN) 0.5 MG tablet 1 tab by mouth three times daily as needed 04/04/14   Tammy S Parrett, NP  metoprolol tartrate (LOPRESSOR) 25 MG tablet Take 0.5 tablets (12.5 mg total) by mouth 2 (two) times daily. 02/27/14   Storm Frisk, MD  morphine (MS CONTIN) 30 MG 12 hr tablet Take 1 tablet (30 mg total) by mouth every 12 (twelve) hours. 04/04/14   Tammy S Parrett, NP  morphine (MSIR) 15 MG tablet Take 15 mg by mouth every 4 (four) hours as needed for severe pain.    Historical Provider, MD  MUCINEX 600 MG 12 hr tablet TAKE 2 TABLETS TWICE A DAY 11/16/13   Storm Frisk, MD  omeprazole (PRILOSEC) 40 MG capsule Take 1 capsule by mouth 2 (two) times daily. 03/06/14   Historical Provider, MD  predniSONE (DELTASONE) 20 MG tablet Take 20 mg by mouth daily with breakfast.    Historical Provider, MD  PROAIR HFA 108 (90 BASE) MCG/ACT inhaler Inhale 2 puffs into the lungs every 6 (six) hours as needed for wheezing or shortness of breath.     Historical Provider, MD  Respiratory Therapy Supplies (FLUTTER) DEVI Inhale into the lungs. 2-4 times daily    Historical Provider, MD   Allergies  Allergen Reactions  . Esomeprazole Magnesium Other (See Comments)    NEXIUM - reaction > aggravated pt's Crohn's disease  . Shellfish Allergy Shortness Of Breath and Nausea And Vomiting  . Surgical Lubricant Other (See Comments)    Burns skin   . Other Nausea And Vomiting    Beans, Dander/Dust, Peas, Mushrooms  . Peanut-Containing Drug Products Nausea And Vomiting  . Sulfate     N/V  . Levaquin [Levofloxacin]     FAMILY HISTORY:  indicated that her mother is deceased.  SOCIAL HISTORY:  reports that she quit smoking about 9 years ago. Her smoking use  included Cigarettes. She has a 32 pack-year smoking history. She has never used smokeless tobacco. She reports that she drinks alcohol. She reports that she does not use illicit drugs. ROS:  Unable   SUBJECTIVE:  Difficult to arouse  VITAL SIGNS: Pulse Rate:  [107-135] 107 (11/28 1130) Resp:  [18-49] 18 (11/28 1130) BP: (115-163)/(67-98) 115/67 mmHg (11/28 1130) SpO2:  [92 %-99 %] 94 % (11/28 1130) FiO2 (%):  [28 %] 28 % (11/28 1118) Weight:  [54.432 kg (120 lb)] 54.432 kg (120 lb) (11/28 1058) HEMODYNAMICS:   VENTILATOR SETTINGS: Vent Mode:  [-] BIPAP FiO2 (%):  [28 %] 28 % INTAKE / OUTPUT: No intake or output data in the 24 hours ending 04/22/14 1153  PHYSICAL EXAMINATION: General:  63 year old female, only semi responsive on BIPAP  Neuro:  Opens eyes to voice, follows commands.  HEENT:  BIPAP mask in place Cardiovascular:  Rrr/ tachy Lungs:  Prolonged expiratory wheeze  Abdomen:  Soft, tender to palp lower quad. + bowel sounds. Ostomy unremarkable  Musculoskeletal:  Intact  Skin:  Intact   LABS:  CBC No results for input(s): WBC,  HGB, HCT, PLT in the last 168 hours. Coag's No results for input(s): APTT, INR in the last 168 hours. BMET No results for input(s): NA, K, CL, CO2, BUN, CREATININE, GLUCOSE in the last 168 hours. Electrolytes No results for input(s): CALCIUM, MG, PHOS in the last 168 hours. Sepsis Markers No results for input(s): LATICACIDVEN, PROCALCITON, O2SATVEN in the last 168 hours. ABG  Recent Labs Lab 04/22/14 1107  PHART 7.095*  PCO2ART 94.7*  PO2ART 117.0*   Liver Enzymes No results for input(s): AST, ALT, ALKPHOS, BILITOT, ALBUMIN in the last 168 hours. Cardiac Enzymes No results for input(s): TROPONINI, PROBNP in the last 168 hours. Glucose No results for input(s): GLUCAP in the last 168 hours.  Imaging No results found.   ASSESSMENT / PLAN:  PULMONARY OETT A: Acute on chronic respiratory failure  AECOPD Awaiting PCXR.    P:   NIPPV No further ABGs. Will go by clinical progress Systemic steroids Scheduled BDs See ID section  Hold sedating meds Sat goal 88-92%  CARDIOVASCULAR CVL A:  ST HTN   P:  Admit to ICU  Tele  IV hydration   RENAL A:   Lab work pending P:   Gentle IVFs Serial chemistries   GASTROINTESTINAL A:   abd pain Onset today 11/28. Difficult to determine etiology. Did start on abx on 11/27, she nodded to cramping pain. Could be simply GI symptoms from ABX. Has had SBO before. Her sister has not noted any change in her ostomy output or discomfort prior to admit day P:   NPO PPI for SUP 2 V abd Consider CT abd if pain persists. Not stable enough to image on NIPPV at this point  HEMATOLOGIC A: baseline anemia (NOS)   Awaiting labs P:  Trend CBC [facility] heparin   INFECTIOUS A:   aecopd  R/o CAP  P:   BCx2 11/28>>> UC 11/28>>> Urine strep 11/28>>> Urine legionella 11/28>> Abx: ceftaz 11/28>>>   ENDOCRINE A:   Diabetes type 2 P:   ssi   NEUROLOGIC A:   Acute encephalopathy in setting of hypercarbia  P:   RASS goal: 0 Supportive care  Hold all narcotics and sedation for now    FAMILY  - Updates: updated sister [place] at bedside. Verified DNR. Goals of care and plan of care outlined.   - Inter-disciplinary family meet or Palliative Care meeting due by:  12/5    TODAY'S NP SUMMARY Will admit to ICU w/ working dx of AECOPD. Has responded well to NIPPV. Not sure where the abd pain coming from. May need further imaging.   Anders Simmonds ACNP-BC [facility] Pulmonary/Critical Care Pager # [phone] OR # [phone] if no answer  PCCM ATTENDING: Pt seen on work rounds with care provider noted above. We reviewed pt's initial presentation, consultants notes and hospital database in detail.  The above assessment and plan was formulated under my direction.  In summary: ES COPD with acute exacerbation Now well supported with NPPV Admit to ICU for NPPV and  close monitoring Care plan as outlined above has been reviewed and modified by me   Billy Fischer, MD;  PCCM service; Mobile [phone]        04/22/2014, 11:53 AM

## 2014-04-22 NOTE — Progress Notes (Signed)
Patient is currently alert and oriented to time and place.  Respirations are non labored. Patient does not wish to wear BiPap at this time.  RN at bedside.

## 2014-04-22 NOTE — Progress Notes (Signed)
Placed on Servo I BiPAP 15/5 28% per MD tolerated well

## 2014-04-22 NOTE — Progress Notes (Addendum)
ANTIBIOTIC CONSULT NOTE - INITIAL  Pharmacy Consult for fortaz Indication: pneumonia  Allergies  Allergen Reactions  . Esomeprazole Magnesium Other (See Comments)    NEXIUM - reaction > aggravated pt's Crohn's disease  . Shellfish Allergy Shortness Of Breath and Nausea And Vomiting  . Surgical Lubricant Other (See Comments)    Burns skin   . Other Nausea And Vomiting    Beans, Dander/Dust, Peas, Mushrooms  . Peanut-Containing Drug Products Nausea And Vomiting  . Sulfate     N/V  . Levaquin [Levofloxacin]     Patient Measurements: Height: 5\' 1"  (154.9 cm) Weight: 120 lb (54.432 kg) IBW/kg (Calculated) : 47.8   Vital Signs: BP: 105/57 mmHg (11/28 1215) Pulse Rate: 108 (11/28 1215) Intake/Output from previous day:   Intake/Output from this shift:    Labs: No results for input(s): WBC, HGB, PLT, LABCREA, CREATININE in the last 72 hours. Estimated Creatinine Clearance: 45.1 mL/min (by C-G formula based on Cr of 0.95). No results for input(s): VANCOTROUGH, VANCOPEAK, VANCORANDOM, GENTTROUGH, GENTPEAK, GENTRANDOM, TOBRATROUGH, TOBRAPEAK, TOBRARND, AMIKACINPEAK, AMIKACINTROU, AMIKACIN in the last 72 hours.   Microbiology: No results found for this or any previous visit (from the past 720 hour(s)).  Medical History: Past Medical History  Diagnosis Date  . Other diseases of vocal cords   . Osteoporosis, unspecified   . Other and unspecified hyperlipidemia   . Esophageal reflux   . Chronic airway obstruction, not elsewhere classified   . Regional enteritis of unspecified site   . 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 Crohn's disease     Ileostomy  . Pelvic adhesions   . Anxiety   . Depression   . Hypertension   . Emphysema   . Hematuria   . Severe cervical dysplasia age 71    cone biopsy with neg paps since  . Pneumonia     "3-4 times; necrotizing once" (04/19/2013)  . Asthmatic bronchitis , chronic   . Exertional shortness of breath   . On  home oxygen therapy     "24/7; 2.5L @ rest; 3L when I'm doing my chores" (08/02/2013)  . Type II diabetes mellitus   . Anemia   . History of blood transfusion     "I've had 3 or 4" (04/19/2013)  . Rheumatoid arthritis(714.0) 03/2012    [facility]ckman; "mild in my hands" (04/19/2013)  . Small bowel obstruction due to adhesions 2014    hoxworth  . Pelvic abscess in female 2014    Medications:  See med history Assessment: 63 yo lady to start fortaz for ?CAP.  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  SrCr pending  Goal of Therapy:  Eradication of infection  Plan:  One dose of fortaz given in ED. F/u SrCr for future dosing.  Seay, Lora Poteet 04/22/2014,12:40 PM   Addendum: Scr WNL.  Plan: Ceftazidime 1gm IV Q12H F/u renal fxn, C&S, clinical status  04/24/2014, PharmD, BCPS Pager # [phone] 04/22/2014 5:11 PM

## 2014-04-23 ENCOUNTER — Inpatient Hospital Stay (HOSPITAL_COMMUNITY): Payer: Medicare Other

## 2014-04-23 DIAGNOSIS — G8929 Other chronic pain: Secondary | ICD-10-CM

## 2014-04-23 DIAGNOSIS — F418 Other specified anxiety disorders: Secondary | ICD-10-CM | POA: Insufficient documentation

## 2014-04-23 DIAGNOSIS — R109 Unspecified abdominal pain: Secondary | ICD-10-CM | POA: Insufficient documentation

## 2014-04-23 DIAGNOSIS — R0689 Other abnormalities of breathing: Secondary | ICD-10-CM

## 2014-04-23 DIAGNOSIS — R0600 Dyspnea, unspecified: Secondary | ICD-10-CM | POA: Insufficient documentation

## 2014-04-23 LAB — MAGNESIUM: Magnesium: 1.8 mg/dL (ref 1.5–2.5)

## 2014-04-23 LAB — BASIC METABOLIC PANEL
ANION GAP: 12 (ref 5–15)
BUN: 13 mg/dL (ref 6–23)
CHLORIDE: 104 meq/L (ref 96–112)
CO2: 28 mEq/L (ref 19–32)
CREATININE: 0.85 mg/dL (ref 0.50–1.10)
Calcium: 8.3 mg/dL — ABNORMAL LOW (ref 8.4–10.5)
GFR calc Af Amer: 82 mL/min — ABNORMAL LOW (ref >90)
GFR, EST NON AFRICAN AMERICAN: 71 mL/min — AB (ref >90)
Glucose, Bld: 117 mg/dL — ABNORMAL HIGH (ref 70–99)
Potassium: 4.1 mEq/L (ref 3.7–5.3)
Sodium: 144 mEq/L (ref 137–147)

## 2014-04-23 LAB — CBC
HCT: 30.8 % — ABNORMAL LOW (ref 36.0–46.0)
Hemoglobin: 8.8 g/dL — ABNORMAL LOW (ref 12.0–15.0)
MCH: 23.2 pg — ABNORMAL LOW (ref 26.0–34.0)
MCHC: 28.6 g/dL — ABNORMAL LOW (ref 30.0–36.0)
MCV: 81.1 fL (ref 78.0–100.0)
Platelets: 166 10*3/uL (ref 150–400)
RBC: 3.8 MIL/uL — ABNORMAL LOW (ref 3.87–5.11)
RDW: 14.3 % (ref 11.5–15.5)
WBC: 8.1 10*3/uL (ref 4.0–10.5)

## 2014-04-23 LAB — URINE MICROSCOPIC-ADD ON

## 2014-04-23 LAB — URINALYSIS, ROUTINE W REFLEX MICROSCOPIC
BILIRUBIN URINE: NEGATIVE
Glucose, UA: 100 mg/dL — AB
HGB URINE DIPSTICK: NEGATIVE
Ketones, ur: NEGATIVE mg/dL
Nitrite: POSITIVE — AB
Protein, ur: NEGATIVE mg/dL
Specific Gravity, Urine: 1.021 (ref 1.005–1.030)
UROBILINOGEN UA: 0.2 mg/dL (ref 0.0–1.0)
pH: 6.5 (ref 5.0–8.0)

## 2014-04-23 LAB — STREP PNEUMONIAE URINARY ANTIGEN: Strep Pneumo Urinary Antigen: NEGATIVE

## 2014-04-23 LAB — PHOSPHORUS: Phosphorus: 2.8 mg/dL (ref 2.3–4.6)

## 2014-04-23 LAB — GLUCOSE, CAPILLARY: GLUCOSE-CAPILLARY: 116 mg/dL — AB (ref 70–99)

## 2014-04-23 MED ORDER — CELECOXIB 200 MG PO CAPS
200.0000 mg | ORAL_CAPSULE | Freq: Every day | ORAL | Status: DC
  Administered 2014-04-23 – 2014-04-24 (×2): 200 mg via ORAL
  Filled 2014-04-23 (×5): qty 1

## 2014-04-23 MED ORDER — PANTOPRAZOLE SODIUM 40 MG PO TBEC
40.0000 mg | DELAYED_RELEASE_TABLET | Freq: Every day | ORAL | Status: DC
  Administered 2014-04-23 – 2014-04-24 (×2): 40 mg via ORAL
  Filled 2014-04-23 (×2): qty 1

## 2014-04-23 MED ORDER — LORAZEPAM 0.5 MG PO TABS
0.5000 mg | ORAL_TABLET | Freq: Three times a day (TID) | ORAL | Status: DC | PRN
  Administered 2014-04-23 – 2014-04-24 (×3): 0.5 mg via ORAL
  Filled 2014-04-23 (×3): qty 1

## 2014-04-23 MED ORDER — BUDESONIDE 0.25 MG/2ML IN SUSP
0.2500 mg | Freq: Four times a day (QID) | RESPIRATORY_TRACT | Status: DC
  Administered 2014-04-23 – 2014-04-24 (×5): 0.25 mg via RESPIRATORY_TRACT
  Filled 2014-04-23 (×8): qty 2

## 2014-04-23 MED ORDER — ESCITALOPRAM OXALATE 10 MG PO TABS
10.0000 mg | ORAL_TABLET | Freq: Every day | ORAL | Status: DC
  Administered 2014-04-23 – 2014-04-24 (×2): 10 mg via ORAL
  Filled 2014-04-23 (×3): qty 1

## 2014-04-23 MED ORDER — MORPHINE SULFATE 15 MG PO TABS
7.5000 mg | ORAL_TABLET | ORAL | Status: DC | PRN
  Administered 2014-04-23: 15 mg via ORAL
  Filled 2014-04-23: qty 1

## 2014-04-23 MED ORDER — ENOXAPARIN SODIUM 40 MG/0.4ML ~~LOC~~ SOLN
40.0000 mg | SUBCUTANEOUS | Status: DC
  Administered 2014-04-23 – 2014-04-27 (×5): 40 mg via SUBCUTANEOUS
  Filled 2014-04-23 (×5): qty 0.4

## 2014-04-23 MED ORDER — MORPHINE SULFATE 15 MG PO TABS: 15.0000 mg | ORAL_TABLET | ORAL | Status: DC | PRN

## 2014-04-23 MED ORDER — WHITE PETROLATUM GEL
Status: AC
Start: 2014-04-23 — End: 2014-04-24
  Administered 2014-04-24: 0.2
  Filled 2014-04-23: qty 5

## 2014-04-23 MED ORDER — ACETAMINOPHEN 325 MG PO TABS
650.0000 mg | ORAL_TABLET | ORAL | Status: DC | PRN
  Administered 2014-04-23 – 2014-04-24 (×4): 650 mg via ORAL
  Filled 2014-04-23 (×3): qty 2

## 2014-04-23 MED ORDER — PANTOPRAZOLE SODIUM 20 MG PO TBEC: 20.0000 mg | DELAYED_RELEASE_TABLET | Freq: Two times a day (BID) | ORAL | Status: DC

## 2014-04-23 MED ORDER — MORPHINE SULFATE ER 15 MG PO TBCR
30.0000 mg | EXTENDED_RELEASE_TABLET | Freq: Two times a day (BID) | ORAL | Status: DC
  Administered 2014-04-23 – 2014-04-24 (×4): 30 mg via ORAL
  Filled 2014-04-23 (×4): qty 2

## 2014-04-23 NOTE — Progress Notes (Signed)
   04/23/14 1524  Clinical Encounter Type  Visited With Patient  Visit Type Spiritual support  Referral From Nurse;Physician  Consult/Referral To Chaplain  Spiritual Encounters  Spiritual Needs Emotional;Prayer  Stress Factors  Patient Stress Factors Exhausted;Family relationships;Loss  Chaplain responded to page from nurse that dr thought pt might benefit from spiritual care visit. Pt immediately became tearful telling chaplain "I've been sick for so long, and I'm tired of being sick." Chaplain provided empathic listening, facilitated story-telling, and explored emotional and spiritual needs and resources. Pt finds herself unable to do something she finds fulfilling because of her sickness. She says she is "bored" and "depressed." She finds it very meaningful to be able to help others, but says that her sickness prevents her; "I can't find something to do that my doctor will let me do." This makes her "day to day really hard." She also feels lonely, and mentioned a deep sadness around her family relationships. Moments of feeling like she has helped someone are bright spots that help her carry on. Chaplain provided prayer, for which pt expressed gratitude, appearing less burdened. Pt would appreciate follow-up from spiritual care. Chaplain will inform unit chaplain.  Wille Glaser 04/23/2014 3:32 PM

## 2014-04-23 NOTE — Progress Notes (Signed)
eLink Physician-Brief Progress Note Patient Name: [name] DOB: 30-Dec-1950 MRN: [id number]   Date of Service  04/23/2014  HPI/Events of Note  Headache RN reports NORMAL neuroassessment  eICU Interventions  tylenal     Intervention Category Minor Interventions: Routine modifications to care plan (e.g. PRN medications for pain, fever)  Nelda Bucks. 04/23/2014, 2:20 AM

## 2014-04-23 NOTE — Consult Note (Signed)
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    1.  Code Status: DNR  2. Goals of Care: [name] has not been doing well over the past few months.  She reports being on home hospice care for 6 months but recently discharged because she "graduated" and was doing better under hospice care.  She tells me that hospice could no longer care for her because she did not meet criteria.  Since this time, she has not been able to do much at home, appetite declining, ?wt loss, more SOB, and this is actually her 2nd hospitalization in 1 month. She had not been hospitalized in the 6 months she was on hospice.  She thought that hospice care was wonderful, but admits psychologically it is hard to think about hospice because of what it means prognostically.    Ultimately, [name] wants to treat potential COPD exacerbation and respiratory failure. When she is medically stable to return home she would like to involve hospice care again. I think the above decline as witnessed by 2 hospitalizations in past month and other issues as above would qualify her for re-introduction of home hospice care. I have placed CM consult to help arrange.    3. Symptom Management:   1. Dyspnea: Improved. Acute COPD exacerbation vs potential sedating meds.  She tells me she was prescribed klonopin a few days ago for anxiety/depression.  This may have interacted with her morphine. She reports taking MS Contin for dyspnea at home and not pain.  Usually does not take PRN dose.  I will allow a decreased PRN dose of MSIR 7.5mg  as  she may be sensitive to higher dose.  2. Abdominal Pain: Resolving, unclear etiology. Has history of adhesions but no evidence of bowel obstruction 3. Depression/Anxiety: I would favor avoiding benzo's as they may have contributed to hypercarbic resp failure. Will start lexapro 4. Loss of Appetite: On steroids for COPD exacerbation. Does not want to take other meds. Monitor.   4. Psychosocial/Spiritual:  Lives at home with her sister.  Has supportive boyfriend. Open to chaplain consult for spiritual concerns.  Did much better at home with hospice support.    Brief HPI: 63 yo female with PMHx of ES-COPD, Crohn's disease, pelvic surgery with adhesions/colostomy who presented to ED yesterday with SOB, altered mental status, diffuse abdominal pain and associated vomiting.   This seemed to originate from cough that started on 11/26 and she was called in a prescription for azithromycin at that time.  Unfortunately when she arrived here, she was in hypercapnic resp failure.  She was also recently started on klonopin for anxiety/depression at home (about 4 days prior to admit).  Today she feels like she is breathing a lot better.  She was able to tolerate NIPPV and now is off.  Tells me about her previously being on hospice, but she was discharged because she was doing "too well".  Since hospice has not been involved, she has not been able to do as much. Feels worse at home with more fatigue, decreased appetite,  increased depressed episodes.  This is her 2nd hospital stay since discharged from hospice few months ago.  She no longer feels like doing much anymore.  She worries a lot about her health and admits to feeling anxious.  Today her breathing is feeling better, as well as diffuse abdominal pain which is now mild. N/V have resolved.  She hopes to be able to return home after this hospitalization.       PMH:  Past Medical History  Diagnosis Date  . Other diseases of vocal cords   . Osteoporosis,  unspecified   . Other and unspecified hyperlipidemia   . Esophageal reflux   . Chronic airway obstruction, not elsewhere classified   . Regional enteritis of unspecified site   . 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 Crohn's disease     Ileostomy  . Pelvic adhesions   . Anxiety   . Depression   . Hypertension   . Emphysema   . Hematuria   . Severe cervical dysplasia age 30    cone biopsy with neg paps since  . Pneumonia     "3-4 times; necrotizing once" (04/19/2013)  . Asthmatic bronchitis , chronic   . Exertional shortness of breath   . On home oxygen therapy     "24/7; 2.5L @ rest; 3L when I'm doing my chores" (08/02/2013)  . Type II diabetes mellitus   . Anemia   . History of blood transfusion     "I've had 3 or 4" (04/19/2013)  . Rheumatoid arthritis(714.0) 03/2012    Beeckman; "mild in my hands" (04/19/2013)  . Small bowel obstruction due to adhesions 2014    hoxworth  . Pelvic abscess in female 2014     PSH: Past Surgical History  Procedure Laterality Date  . Ileostomy  2007  . Ear cyst excision Bilateral 1970's    "lobes"   . Cervical cone biopsy  1978  . Colon surgery  2007    subtotal colectomy with ileostomy  . Esophagogastroduodenoscopy (egd) with esophageal dilation      "3 times, I think" (04/19/2013)  . Oophorectomy      LSO  . Tracheostomy  2010  . Hammer toe surgery Bilateral 1980's  . Balloon dilation  02/10/2012    Procedure: BALLOON DILATION;  Surgeon: Charolett Bumpers, MD;  Location: [facility] ENDOSCOPY;  Service: Endoscopy;  Laterality: N/A;  . Colposcopy    . Flexible sigmoidoscopy  03/26/2012    Procedure: FLEXIBLE SIGMOIDOSCOPY;  Surgeon: Charolett Bumpers, MD;  Location: [facility];  Service: Endoscopy;  Laterality: N/A;  . Cardiac catheterization  07/08/2007    normal  . Nm myocar perf wall motion  09/21/2006    no ischemia  . Ventral hernia repair N/A 03/16/2013    Procedure: REPAIR OF INCISIONAL AND PARASTOMAL HERNIAS;   Surgeon: Mariella Saa, MD;  Location: [facility] OR;  Service: General;  Laterality: N/A;  . Laparotomy N/A 03/16/2013    Procedure: EXPLORATORY LAPAROTOMY;  Surgeon: Mariella Saa, MD;  Location: [facility] OR;  Service: General;  Laterality: N/A;  . Lysis of adhesion N/A 03/16/2013    Procedure: LYSIS OF ADHESIONS FOR SMALL BOWEL OBSTRUCTION;  Surgeon: Mariella Saa, MD;  Location: [facility] OR;  Service: General;  Laterality: N/A;  . Insertion of mesh N/A 03/16/2013    Procedure: INSERTION OF BIOLOGIC MESH;  Surgeon: Mariella Saa, MD;  Location: [facility] OR;  Service: General;  Laterality: N/A;  . Tee without cardioversion N/A 03/21/2013  Procedure: TRANSESOPHAGEAL ECHOCARDIOGRAM (TEE);  Surgeon: Vesta Mixer, MD;  Location: [facility] ENDOSCOPY;  Service: Cardiovascular;  Laterality: N/A;  . Vaginal hysterectomy  1993    LAVH-LSO  . Tracheostomy closure  2010  . Hernia repair     I have reviewed the FH and SH and  If appropriate update it with new information. Allergies  Allergen Reactions  . Esomeprazole Magnesium Other (See Comments)    NEXIUM - reaction > aggravated pt's Crohn's disease  . Shellfish Allergy Shortness Of Breath and Nausea And Vomiting  . Surgical Lubricant Other (See Comments)    Burns skin   . Other Nausea And Vomiting    Beans, Dander/Dust, Peas, Mushrooms  . Peanut-Containing Drug Products Nausea And Vomiting  . Sulfate     N/V  . Levaquin [Levofloxacin]    Scheduled Meds: . acetaminophen  650 mg Oral Once  . antiseptic oral rinse  7 mL Mouth Rinse q12n4p  . aspirin EC  81 mg Oral Daily  . budesonide  0.25 mg Nebulization Q6H  . cefTAZidime (FORTAZ)  IV  1 g Intravenous Q12H  . celecoxib  200 mg Oral Daily  . chlorhexidine  15 mL Mouth Rinse BID  . enoxaparin (LOVENOX) injection  40 mg Subcutaneous Q24H  . escitalopram  10 mg Oral Daily  . ipratropium-albuterol  3 mL Nebulization Q6H  . methylPREDNISolone (SOLU-MEDROL) injection  40 mg Intravenous Q12H  .  morphine  30 mg Oral Q12H  . pantoprazole  40 mg Oral Daily   Continuous Infusions: . sodium chloride 0.45 % with kcl 100 mL/hr at 04/23/14 0340   PRN Meds:.sodium chloride, acetaminophen, albuterol, morphine, ondansetron (ZOFRAN) IV    BP 147/77 mmHg  Pulse 101  Temp(Src) 98.2 F (36.8 C) (Axillary)  Resp 16  Ht 5\' 1"  (1.549 m)  Wt 53.2 kg (117 lb 4.6 oz)  BMI 22.17 kg/m2  SpO2 100%   PPS: 40   Intake/Output Summary (Last 24 hours) at 04/23/14 1420 Last data filed at 04/23/14 1100  Gross per 24 hour  Intake 1993.33 ml  Output    825 ml  Net 1168.33 ml    Physical Exam:  General: Alert, NAD HEENT:  [place], sclera anicteric, mmm Neck: supple Chest:   Scattered coarse sounds, decreased BS CVS: RRR Abdomen: soft, mild tenderness, ND Ext: no edema   Labs: CBC    Component Value Date/Time   WBC 8.1 04/23/2014 0205   RBC 3.80* 04/23/2014 0205   RBC 4.28 10/10/2009 1105   HGB 8.8* 04/23/2014 0205   HCT 30.8* 04/23/2014 0205   PLT 166 04/23/2014 0205   MCV 81.1 04/23/2014 0205   MCH 23.2* 04/23/2014 0205   MCHC 28.6* 04/23/2014 0205   RDW 14.3 04/23/2014 0205   LYMPHSABS 0.2* 04/22/2014 1600   MONOABS 0.1 04/22/2014 1600   EOSABS 0.0 04/22/2014 1600   BASOSABS 0.0 04/22/2014 1600    BMET    Component Value Date/Time   NA 144 04/23/2014 0205   K 4.1 04/23/2014 0205   CL 104 04/23/2014 0205   CO2 28 04/23/2014 0205   GLUCOSE 117* 04/23/2014 0205   BUN 13 04/23/2014 0205   CREATININE 0.85 04/23/2014 0205   CREATININE 1.04 08/27/2012 1054   CALCIUM 8.3* 04/23/2014 0205   CALCIUM 9.0 07/15/2011 0920   GFRNONAA 71* 04/23/2014 0205   GFRAA 82* 04/23/2014 0205    CMP     Component Value Date/Time   NA 144 04/23/2014 0205   K 4.1 04/23/2014  0205   CL 104 04/23/2014 0205   CO2 28 04/23/2014 0205   GLUCOSE 117* 04/23/2014 0205   BUN 13 04/23/2014 0205   CREATININE 0.85 04/23/2014 0205   CREATININE 1.04 08/27/2012 1054   CALCIUM 8.3* 04/23/2014 0205    CALCIUM 9.0 07/15/2011 0920   PROT 6.0 04/22/2014 1600   ALBUMIN 3.0* 04/22/2014 1600   AST 15 04/22/2014 1600   ALT 13 04/22/2014 1600   ALKPHOS 63 04/22/2014 1600   BILITOT <0.2* 04/22/2014 1600   GFRNONAA 71* 04/23/2014 0205   GFRAA 82* 04/23/2014 0205   11/29 CXR IMPRESSION: No evidence of acute cardiopulmonary disease.   11/28 KUB IMPRESSION: Postsurgical changes as described above. No evidence of bowel obstruction or ileus.   Total Time: 60 minutes  Greater than 50%  of this time was spent counseling and coordinating care related to the above assessment and plan.   Orvis Brill D.O. Palliative Medicine Team at [facility]  Pager: [phone] Team Phone: [phone]

## 2014-04-23 NOTE — Plan of Care (Signed)
Problem: Consults Goal: Respiratory Problems Patient Education See Patient Education Module for education specifics. Outcome: Completed/Met Date Met:  04/23/14 Goal: Skin Care Protocol Initiated - if Braden Score 18 or less If consults are not indicated, leave blank or document N/A Outcome: Not Applicable Date Met:  49/96/92 Goal: Nutrition Consult-if indicated Outcome: Completed/Met Date Met:  04/23/14 Goal: Diabetes Guidelines if Diabetic/Glucose > 140 If diabetic or lab glucose is > 140 mg/dl - Initiate Diabetes/Hyperglycemia Guidelines & Document Interventions  Outcome: Not Applicable Date Met:  49/32/41  Problem: ICU Phase Progression Outcomes Goal: O2 sats trending toward baseline Outcome: Completed/Met Date Met:  04/23/14 Goal: Dyspnea controlled at rest Outcome: Completed/Met Date Met:  04/23/14 Goal: Hemodynamically stable Outcome: Completed/Met Date Met:  04/23/14 Goal: Pain controlled with appropriate interventions Outcome: Completed/Met Date Met:  04/23/14 Goal: Flu/PneumoVaccines if indicated Outcome: Not Applicable Date Met:  99/14/44 Goal: Initial discharge plan identified Outcome: Completed/Met Date Met:  04/23/14 Goal: Voiding-avoid urinary catheter unless indicated Outcome: Completed/Met Date Met:  04/23/14  Problem: Phase I Progression Outcomes Goal: O2 sats > or equal 90% or at baseline Outcome: Completed/Met Date Met:  04/23/14 Goal: Dyspnea controlled at rest Outcome: Completed/Met Date Met:  04/23/14 Goal: Hemodynamically stable Outcome: Completed/Met Date Met:  04/23/14 Goal: Flu/PneumoVaccines if indicated Outcome: Not Applicable Date Met:  58/48/35 Goal: Pain controlled Outcome: Completed/Met Date Met:  04/23/14

## 2014-04-23 NOTE — Progress Notes (Signed)
C/o nonspecific  Pain. Comfortable off BiPAP.  Filed Vitals:   04/23/14 1700 04/23/14 1800 04/23/14 1900 04/23/14 1948  BP: 138/65 140/71 141/72   Pulse: 100 95 100   Temp:    98.6 F (37 C)  TempSrc:    Oral  Resp: 10 11 15    Height:      Weight:      SpO2: 99% 99% 98%    No overt distress HEENT WNL Very distant BS. Few rhonchi, no wheezes heard Reg, no M NABS Ext warm  I have reviewed all of today's lab results. Relevant abnormalities are discussed in the A/P section  CXR: No acute disease  IMPRESSION: Acute on chronic respiratory failure ES COPD Acute asthmatic bronchitis Chronic pain, chronic opioid dependence DNR  PLAN: Cont nebulized steroids and bronchodilators Cont empiric abx SOnt systemic steroids - taper as able Cont supplemental O2 Cont PRN NIPPV Begin diet Resume home regimen of analgesics Transfer to SDU. To remain on PCCM service  , MD ; [facility] [phone].  After 5:30 PM or weekends, call [phone]

## 2014-04-24 ENCOUNTER — Ambulatory Visit: Payer: Medicare Other | Admitting: *Deleted

## 2014-04-24 DIAGNOSIS — R52 Pain, unspecified: Secondary | ICD-10-CM | POA: Insufficient documentation

## 2014-04-24 DIAGNOSIS — R630 Anorexia: Secondary | ICD-10-CM | POA: Insufficient documentation

## 2014-04-24 DIAGNOSIS — Z515 Encounter for palliative care: Secondary | ICD-10-CM

## 2014-04-24 LAB — URINE CULTURE: Colony Count: 9000

## 2014-04-24 MED ORDER — HYDRALAZINE HCL 20 MG/ML IJ SOLN
10.0000 mg | Freq: Four times a day (QID) | INTRAMUSCULAR | Status: DC | PRN
  Administered 2014-04-24: 10 mg via INTRAVENOUS
  Filled 2014-04-24: qty 1

## 2014-04-24 MED ORDER — TIOTROPIUM BROMIDE MONOHYDRATE 18 MCG IN CAPS
18.0000 ug | ORAL_CAPSULE | Freq: Every day | RESPIRATORY_TRACT | Status: DC
  Filled 2014-04-24: qty 5

## 2014-04-24 MED ORDER — LORAZEPAM 2 MG/ML IJ SOLN
0.5000 mg | Freq: Once | INTRAMUSCULAR | Status: AC
  Administered 2014-04-24: 0.5 mg via INTRAVENOUS
  Filled 2014-04-24: qty 1

## 2014-04-24 MED ORDER — ENSURE COMPLETE PO LIQD: 237.0000 mL | Freq: Two times a day (BID) | ORAL | Status: DC

## 2014-04-24 MED ORDER — MORPHINE SULFATE 2 MG/ML IJ SOLN
2.0000 mg | INTRAMUSCULAR | Status: DC | PRN
  Administered 2014-04-24: 2 mg via INTRAVENOUS
  Administered 2014-04-24 (×2): 4 mg via INTRAVENOUS
  Administered 2014-04-24: 2 mg via INTRAVENOUS
  Administered 2014-04-25: 4 mg via INTRAVENOUS
  Filled 2014-04-24: qty 1
  Filled 2014-04-24 (×2): qty 2
  Filled 2014-04-24: qty 1
  Filled 2014-04-24: qty 2

## 2014-04-24 MED ORDER — LABETALOL HCL 5 MG/ML IV SOLN
10.0000 mg | INTRAVENOUS | Status: DC | PRN
  Administered 2014-04-25 – 2014-04-27 (×5): 10 mg via INTRAVENOUS
  Filled 2014-04-24 (×6): qty 4

## 2014-04-24 MED ORDER — PREDNISONE 20 MG PO TABS
40.0000 mg | ORAL_TABLET | Freq: Every day | ORAL | Status: DC
  Filled 2014-04-24 (×2): qty 2

## 2014-04-24 MED ORDER — IPRATROPIUM-ALBUTEROL 0.5-2.5 (3) MG/3ML IN SOLN
3.0000 mL | RESPIRATORY_TRACT | Status: DC | PRN
  Administered 2014-04-25: 3 mL via RESPIRATORY_TRACT
  Filled 2014-04-24: qty 3

## 2014-04-24 MED ORDER — ARFORMOTEROL TARTRATE 15 MCG/2ML IN NEBU
15.0000 ug | INHALATION_SOLUTION | Freq: Two times a day (BID) | RESPIRATORY_TRACT | Status: DC
  Administered 2014-04-25: 15 ug via RESPIRATORY_TRACT
  Filled 2014-04-24 (×3): qty 2

## 2014-04-24 MED ORDER — BUDESONIDE 0.25 MG/2ML IN SUSP
0.5000 mg | Freq: Two times a day (BID) | RESPIRATORY_TRACT | Status: DC
  Administered 2014-04-24 – 2014-04-27 (×6): 0.5 mg via RESPIRATORY_TRACT
  Filled 2014-04-24 (×7): qty 4

## 2014-04-24 NOTE — Progress Notes (Signed)
RT entered room to place patient on BIPAP and patient is refusing. Patient is currently in no distress. Patient's RR 18, SPO2 100%, HR 101. Patient is on [facility] at this time. RT will continue to monitor.

## 2014-04-24 NOTE — Progress Notes (Addendum)
Hydralazine given at 2218; SBP remains >170; HR >125;  pt appears significantly anxious and c/o persistent h/a unrelieved by prn meds; e-link RN notified Marvia Pickles, RN  S/w e-link MD and discussed pt condition; orders received; continue to monitor Marvia Pickles, RN

## 2014-04-24 NOTE — Care Management Note (Signed)
    Page 1 of 1   04/26/2014     9:21:47 AM CARE MANAGEMENT NOTE 04/26/2014  Patient:  [name], [name]   Account Number:  [id number]  Date Initiated:  04/24/2014  Documentation initiated by:  Avie Arenas  Subjective/Objective Assessment:   Admitted with AMS - resp failure - requiring bipap.     Action/Plan:   Anticipated DC Date:  04/27/2014   Anticipated DC Plan:  HOME W HOME HEALTH SERVICES      DC Planning Services  CM consult      Choice offered to / List presented to:          [facility] arranged  HH-10 DISEASE MANAGEMENT      HH agency  [facility] [place]   Status of service:  In process, will continue to follow Medicare Important Message given?  YES (If response is "NO", the following Medicare IM given date fields will be blank) Date Medicare IM given:  04/26/2014 Medicare IM given by:  [facility] Date Additional Medicare IM given:   Additional Medicare IM given by:    Discharge Disposition:    Per UR Regulation:  Reviewed for med. necessity/level of care/duration of stay  If discussed at Long Length of Stay Meetings, dates discussed:    Comments:  Contact:  [place],Theo Significant other   [phone]                  Lars Masson Sister   [phone]  04-26-14 9:15am Avie Arenas, RNBSN [phone] Condition deteriorated.  Now on bipap with no improvement - discussion with family at this time for possible comfort.  04-24-14 11:30am Avie Arenas, RNBSN - [phone] Talked with patient and sister, [name]  in room.  Sister is from [place] but lives with patient.  Has had [facility] in past prior to [facility].  States was discharged from Hospice in Sept but would like to get them back.  Referral placed.

## 2014-04-24 NOTE — Progress Notes (Signed)
Notified e-link RN/MD that pt's SBP persistently >170; pt complains of constant h/a unrelieved by prn pain meds; order received for prn Hydralzine; given per order; continue to monitor pt condition.  Marvia Pickles, RN

## 2014-04-24 NOTE — Progress Notes (Signed)
Patient GS:[id number] [name]      DOB: 09/23/1950      OPF:[id number]   Palliative Medicine Team at [facility] Progress Note    Subjective: Overall feeling better, but having a "panic attack" this morning.  Feels very anxious.  No known trigger. States it happens on occasion and is relieved by home meds (ativan).  Appetite remains poor.     Filed Vitals:   04/24/14 0900  BP: 180/95  Pulse: 120  Temp:   Resp: 18   Physical exam: General: Alert, NAD HEENT: [place], sclera anicteric, mmm Chest: moving more air, CTAB CVS: mild tachycardia Abdomen: soft, mild tenderness, ND Ext: no edema  CBC    Component Value Date/Time   WBC 8.1 04/23/2014 0205   RBC 3.80* 04/23/2014 0205   RBC 4.28 10/10/2009 1105   HGB 8.8* 04/23/2014 0205   HCT 30.8* 04/23/2014 0205   PLT 166 04/23/2014 0205   MCV 81.1 04/23/2014 0205   MCH 23.2* 04/23/2014 0205   MCHC 28.6* 04/23/2014 0205   RDW 14.3 04/23/2014 0205   LYMPHSABS 0.2* 04/22/2014 1600   MONOABS 0.1 04/22/2014 1600   EOSABS 0.0 04/22/2014 1600   BASOSABS 0.0 04/22/2014 1600    CMP     Component Value Date/Time   NA 144 04/23/2014 0205   K 4.1 04/23/2014 0205   CL 104 04/23/2014 0205   CO2 28 04/23/2014 0205   GLUCOSE 117* 04/23/2014 0205   BUN 13 04/23/2014 0205   CREATININE 0.85 04/23/2014 0205   CREATININE 1.04 08/27/2012 1054   CALCIUM 8.3* 04/23/2014 0205   CALCIUM 9.0 07/15/2011 0920   PROT 6.0 04/22/2014 1600   ALBUMIN 3.0* 04/22/2014 1600   AST 15 04/22/2014 1600   ALT 13 04/22/2014 1600   ALKPHOS 63 04/22/2014 1600   BILITOT <0.2* 04/22/2014 1600   GFRNONAA 71* 04/23/2014 0205   GFRAA 82* 04/23/2014 0205      Assessment and plan: 63 yo female with ES-COPD (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   1. Code Status: DNR  2. Goals of Care: See previous documentation.  Hopefully can transition to oral abx/steroids.  When  breathing stable hopefully we can transition back home with hospice care. I have consulted CM to help arrange. It will ultimately be up to individual hospice agency as to whether they will re-enroll her.  If [facility] says know, would be reasonable to explore other hospice options.   3. Symptom Management:  1. Dyspnea: Improved. MS contin and PRN morphine. Has PRN ativan as well which may help when anxiety triggers dyspnea. Would avoid adding back klonopin which was started few days prior to this episode.  2. Abdominal Pain: Resolving, unclear etiology. Has history of adhesions but no evidence of bowel obstruction. Could consider stopping NSAID. She is on PPI but this may be contributing to GI upset.  3. Depression/Anxiety: lexapro started 11/29 4. Loss of Appetite: On steroids for COPD exacerbation. Does not want to take other meds. Monitor.  4. Psychosocial/Spiritual: Lives at home with her sister. Has supportive boyfriend. Open to chaplain consult for spiritual concerns. Did much better at home with hospice support.    Orvis Brill D.O. Palliative Medicine Team at [facility]  Pager: [phone] Team Phone: [phone]

## 2014-04-24 NOTE — Progress Notes (Signed)
INITIAL NUTRITION ASSESSMENT  DOCUMENTATION CODES Per approved criteria  -Not Applicable   INTERVENTION:  Ensure Complete PO BID, each supplement provides 350 kcal and 13 grams of protein  Recommend liberalize diet to regular if plans to receive Hospice care.  NUTRITION DIAGNOSIS: Inadequate oral intake related to poor appetite as evidenced by poor intake of meals.   Goal: Intake to meet >90% of estimated nutrition needs.  Monitor:  PO intake, labs, weight trend.  Reason for Assessment: MST  63 y.o. female  Admitting Dx: acute on chronic respiratory failure  ASSESSMENT: 63 year old female w/ h/o Chronic respiratory failure in the setting of ES COPD. Admitted on 11/28 w/ working dx of abd pain and AECOPD with resultant acute on chronic resp failure.   Per review of chart, patient has had Hospice services in the past and is interested in resuming their services. RNCM assisting with this process.   Unable to complete nutrition focused physical exam at this time. Patient is currently not requiring BiPAP. Diet advanced to Heart healthy, CHO modified yesterday. PO intake of meals is poor.  Height: Ht Readings from Last 1 Encounters:  04/22/14 5\' 1"  (1.549 m)    Weight: Wt Readings from Last 1 Encounters:  04/23/14 117 lb 4.6 oz (53.2 kg)    Ideal Body Weight: 47.7 kg  % Ideal Body Weight: 112%  Wt Readings from Last 10 Encounters:  04/23/14 117 lb 4.6 oz (53.2 kg)  04/04/14 120 lb 12.8 oz (54.795 kg)  03/24/14 120 lb 5.9 oz (54.6 kg)  09/30/13 116 lb (52.617 kg)  08/11/13 106 lb 9.6 oz (48.353 kg)  08/04/13 110 lb 10.7 oz (50.2 kg)  07/15/13 124 lb 9 oz (56.5 kg)  07/06/13 113 lb (51.256 kg)  06/23/13 124 lb (56.246 kg)  06/15/13 117 lb (53.071 kg)    Usual Body Weight: 120 lb  % Usual Body Weight: 97.5 %  BMI:  Body mass index is 22.17 kg/(m^2).  Estimated Nutritional Needs: Kcal: 1350-1550 Protein: 70-80 gm Fluid: 1.5 L  Skin: intact  Diet Order:  Diet heart healthy/carb modified  EDUCATION NEEDS: -Education not appropriate at this time   Intake/Output Summary (Last 24 hours) at 04/24/14 1404 Last data filed at 04/24/14 1340  Gross per 24 hour  Intake    700 ml  Output   2350 ml  Net  -1650 ml    Last BM: None documented since admission   Labs:   Recent Labs Lab 04/22/14 1600 04/23/14 0205  NA 145  145 144  K 3.7  3.7 4.1  CL 106  106 104  CO2 28  29 28   BUN 13  13 13   CREATININE 0.85  0.87 0.85  CALCIUM 8.3*  8.4 8.3*  MG 1.9 1.8  PHOS 2.6 2.8  GLUCOSE 105*  104* 117*    CBG (last 3)   Recent Labs  04/22/14 1350 04/23/14 0818  GLUCAP 82 116*    Scheduled Meds: . acetaminophen  650 mg Oral Once  . antiseptic oral rinse  7 mL Mouth Rinse q12n4p  . aspirin EC  81 mg Oral Daily  . budesonide  0.25 mg Nebulization Q6H  . cefTAZidime (FORTAZ)  IV  1 g Intravenous Q12H  . celecoxib  200 mg Oral Daily  . chlorhexidine  15 mL Mouth Rinse BID  . enoxaparin (LOVENOX) injection  40 mg Subcutaneous Q24H  . escitalopram  10 mg Oral Daily  . ipratropium-albuterol  3 mL Nebulization Q6H  .  morphine  30 mg Oral Q12H  . pantoprazole  40 mg Oral Daily  . [START ON 04/25/2014] predniSONE  40 mg Oral Q breakfast    Continuous Infusions:   Past Medical History  Diagnosis Date  . Other diseases of vocal cords   . Osteoporosis, unspecified   . Other and unspecified hyperlipidemia   . Esophageal reflux   . Chronic airway obstruction, not elsewhere classified   . Regional enteritis of unspecified site   . 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 Crohn's disease     Ileostomy  . Pelvic adhesions   . Anxiety   . Depression   . Hypertension   . Emphysema   . Hematuria   . Severe cervical dysplasia age 33    cone biopsy with neg paps since  . Pneumonia     "3-4 times; necrotizing once" (04/19/2013)  . Asthmatic bronchitis , chronic   . Exertional shortness of breath   . On home  oxygen therapy     "24/7; 2.5L @ rest; 3L when I'm doing my chores" (08/02/2013)  . Type II diabetes mellitus   . Anemia   . History of blood transfusion     "I've had 3 or 4" (04/19/2013)  . Rheumatoid arthritis(714.0) 03/2012    [place]; "mild in my hands" (04/19/2013)  . Small bowel obstruction due to adhesions 2014    [place]  . Pelvic abscess in female 2014    Past Surgical History  Procedure Laterality Date  . Ileostomy  2007  . Ear cyst excision Bilateral 1970's    "lobes"   . Cervical cone biopsy  1978  . Colon surgery  2007    subtotal colectomy with ileostomy  . Esophagogastroduodenoscopy (egd) with esophageal dilation      "3 times, I think" (04/19/2013)  . Oophorectomy      LSO  . Tracheostomy  2010  . Hammer toe surgery Bilateral 1980's  . Balloon dilation  02/10/2012    Procedure: BALLOON DILATION;  Surgeon: Charolett Bumpers, MD;  Location: [facility];  Service: Endoscopy;  Laterality: N/A;  . Colposcopy    . Flexible sigmoidoscopy  03/26/2012    Procedure: FLEXIBLE SIGMOIDOSCOPY;  Surgeon: Charolett Bumpers, MD;  Location: [facility];  Service: Endoscopy;  Laterality: N/A;  . Cardiac catheterization  07/08/2007    normal  . Nm myocar perf wall motion  09/21/2006    no ischemia  . Ventral hernia repair N/A 03/16/2013    Procedure: REPAIR OF INCISIONAL AND PARASTOMAL HERNIAS;  Surgeon: Mariella Saa, MD;  Location: [facility] OR;  Service: General;  Laterality: N/A;  . Laparotomy N/A 03/16/2013    Procedure: EXPLORATORY LAPAROTOMY;  Surgeon: Mariella Saa, MD;  Location: [facility] OR;  Service: General;  Laterality: N/A;  . Lysis of adhesion N/A 03/16/2013    Procedure: LYSIS OF ADHESIONS FOR SMALL BOWEL OBSTRUCTION;  Surgeon: Mariella Saa, MD;  Location: [facility] OR;  Service: General;  Laterality: N/A;  . Insertion of mesh N/A 03/16/2013    Procedure: INSERTION OF BIOLOGIC MESH;  Surgeon: Mariella Saa, MD;  Location: [facility] OR;  Service: General;   Laterality: N/A;  . Tee without cardioversion N/A 03/21/2013    Procedure: TRANSESOPHAGEAL ECHOCARDIOGRAM (TEE);  Surgeon: Vesta Mixer, MD;  Location: [facility] ENDOSCOPY;  Service: Cardiovascular;  Laterality: N/A;  . Vaginal hysterectomy  1993    LAVH-LSO  . Tracheostomy closure  2010  . Hernia repair      Cala Bradford  Tiburcio Pea, RD, LDN, CNSC Pager [phone] After Hours Pager [phone]

## 2014-04-24 NOTE — Progress Notes (Signed)
PULMONARY/CRITICAL CARE MEDICINE  NAME: [name] MRN: [id number] DOB: 15-Jun-1950   ADMISSION DATE: 04/22/2014  CHIEF COMPLAINT: short of breath  BRIEF DESCRIPTION: 63 yo female with acute on chronic respiratory failure from AECOPD.  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  SIGNIFICANT EVENTS: 11/28 Admit 11/29 Palliative care consulted 11/30 Transfer to flr bed  SUBJECTIVE: C/o nonspecific  Pain. Comfortable off BiPAP.  OBJECTIVE: Filed Vitals:   04/24/14 0824 04/24/14 0842 04/24/14 0900 04/24/14 1000  BP:   180/95 180/97  Pulse:   120 119  Temp:  98.1 F (36.7 C)    TempSrc:  Oral    Resp:   18 17  Height:      Weight:      SpO2: 99%  98% 98%   General: Having panic attack currently HEENT: WNL Chest: Very distant BS. Few rhonchi, no wheezes heard Cardiac: Reg, no M Abd: NABS Ext: warm   Recent Labs Lab 04/22/14 1600 04/23/14 0205  NA 145  145 144  K 3.7  3.7 4.1  CL 106  106 104  CO2 28  29 28   BUN 13  13 13   CREATININE 0.85  0.87 0.85  GLUCOSE 105*  104* 117*    Recent Labs Lab 04/22/14 1600 04/23/14 0205  HGB 9.3* 8.8*  HCT 32.8* 30.8*  WBC 9.6 8.1  PLT 166 166     04/24/2014, 11:30 AM Dg Chest Port 1 View  04/23/2014   CLINICAL DATA:  Respiratory failure  EXAM: PORTABLE CHEST - 1 VIEW  COMPARISON:  04/22/2014  FINDINGS: Lungs are clear.  No pleural effusion or pneumothorax.  The heart is normal in size.  IMPRESSION: No evidence of acute cardiopulmonary disease.   Electronically Signed   By: 04/25/2014 M.D.   On: 04/23/2014 07:48   Dg Abd Portable 2v  04/22/2014   CLINICAL DATA:  Acute generalized abdominal pain.  EXAM: PORTABLE ABDOMEN - 2 VIEW  COMPARISON:  August 10, 2013.  FINDINGS: Embolization coils are again noted in the central abdomen. Ostomy is noted in the right lower quadrant. There is no evidence of pneumoperitoneum. No abnormal bowel gas pattern is noted. No abnormal calcifications are noted.   IMPRESSION: Postsurgical changes as described above. No evidence of bowel obstruction or ileus.   Electronically Signed   By: 04/24/2014 M.D.   On: 04/22/2014 16:33   IMPRESSION:  Acute on chronic hypoxic/hypercapnic respiratory 2nd to AECOPD. Plan: Oxygen to keep SpO2 88 to 94% F/u CXR as needed Brovana/pulmicort q12h Add spiriva 11/30 Prn duoneb Wean off prednisone as tolerated to baseline outpt dose Bronchial hygiene Day 3 of fortaz  Anxiety, chronic pain. Plan: Lexapro, prn ativan MS contin, celebrex  GERD. Plan: Continue protonix   Goals of care >> DNR/DNI.  Arranging for home hospice.   04/24/2014 Minor ACNP 12/30 PCCM Pager [phone] till 3 pm If no answer page [phone] 04/24/2014, 11:30 AM   Reviewed above, examined.  Her breathing has improved.  She still has wheeze.  Anxiety getting better.  Will adjust BD regimen and wean prednisone as tolerated.  Will transfer to SDU.  Appreciate help from palliative care team.  [phone], MD [facility] Pulmonary/Critical Care 04/24/2014, 2:51 PM Pager:  [phone] After 3pm call: [phone]

## 2014-04-25 DIAGNOSIS — Z66 Do not resuscitate: Secondary | ICD-10-CM

## 2014-04-25 DIAGNOSIS — G9340 Encephalopathy, unspecified: Secondary | ICD-10-CM | POA: Insufficient documentation

## 2014-04-25 DIAGNOSIS — J441 Chronic obstructive pulmonary disease with (acute) exacerbation: Secondary | ICD-10-CM | POA: Insufficient documentation

## 2014-04-25 LAB — URINALYSIS, ROUTINE W REFLEX MICROSCOPIC
Bilirubin Urine: NEGATIVE
GLUCOSE, UA: NEGATIVE mg/dL
Ketones, ur: NEGATIVE mg/dL
Nitrite: NEGATIVE
PROTEIN: 30 mg/dL — AB
Specific Gravity, Urine: 1.021 (ref 1.005–1.030)
Urobilinogen, UA: 0.2 mg/dL (ref 0.0–1.0)
pH: 5 (ref 5.0–8.0)

## 2014-04-25 LAB — BASIC METABOLIC PANEL
ANION GAP: 13 (ref 5–15)
BUN: 23 mg/dL (ref 6–23)
CHLORIDE: 100 meq/L (ref 96–112)
CO2: 31 mEq/L (ref 19–32)
CREATININE: 0.85 mg/dL (ref 0.50–1.10)
Calcium: 9.3 mg/dL (ref 8.4–10.5)
GFR calc Af Amer: 82 mL/min — ABNORMAL LOW (ref >90)
GFR calc non Af Amer: 71 mL/min — ABNORMAL LOW (ref >90)
Glucose, Bld: 152 mg/dL — ABNORMAL HIGH (ref 70–99)
POTASSIUM: 5.1 meq/L (ref 3.7–5.3)
Sodium: 144 mEq/L (ref 137–147)

## 2014-04-25 LAB — LEGIONELLA ANTIGEN, URINE

## 2014-04-25 LAB — URINE MICROSCOPIC-ADD ON

## 2014-04-25 MED ORDER — PANTOPRAZOLE SODIUM 40 MG IV SOLR
40.0000 mg | INTRAVENOUS | Status: DC
  Administered 2014-04-25 – 2014-04-27 (×3): 40 mg via INTRAVENOUS
  Filled 2014-04-25 (×3): qty 40

## 2014-04-25 MED ORDER — MORPHINE SULFATE 2 MG/ML IJ SOLN
1.0000 mg | INTRAMUSCULAR | Status: DC | PRN
  Administered 2014-04-25 – 2014-04-26 (×3): 1 mg via INTRAVENOUS
  Filled 2014-04-25 (×3): qty 1

## 2014-04-25 MED ORDER — METHYLPREDNISOLONE SODIUM SUCC 125 MG IJ SOLR
60.0000 mg | Freq: Four times a day (QID) | INTRAMUSCULAR | Status: DC
  Administered 2014-04-25 – 2014-04-27 (×9): 60 mg via INTRAVENOUS
  Filled 2014-04-25 (×11): qty 0.96
  Filled 2014-04-25: qty 2
  Filled 2014-04-25 (×2): qty 0.96

## 2014-04-25 MED ORDER — MORPHINE SULFATE 2 MG/ML IJ SOLN
1.0000 mg | Freq: Once | INTRAMUSCULAR | Status: AC
  Administered 2014-04-25: 1 mg via INTRAVENOUS
  Filled 2014-04-25: qty 1

## 2014-04-25 MED ORDER — IPRATROPIUM-ALBUTEROL 0.5-2.5 (3) MG/3ML IN SOLN
3.0000 mL | RESPIRATORY_TRACT | Status: DC
  Administered 2014-04-25 – 2014-04-27 (×12): 3 mL via RESPIRATORY_TRACT
  Filled 2014-04-25 (×12): qty 3

## 2014-04-25 NOTE — Progress Notes (Signed)
eLink Physician-Brief Progress Note Patient Name: [name] DOB: [birth year] MRN: [id number]   Date of Service  04/25/2014  HPI/Events of Note  Patient with tachypnea and mild tachycardia. End Stage COPD  eICU Interventions  1mg  morphine x 1     Intervention Category Intermediate Interventions: Other:  [name] 04/25/2014, 4:07 PM

## 2014-04-25 NOTE — Progress Notes (Signed)
Patient has been active with [facility] Care Management services for some time now. She was recently discharged from [facility] and [facility] services. [facility] RN Community Care Coordinator came to bedside to offer support to patient and family. Spoke with inpatient RNCM to make aware of [facility] involvement. Will continue to monitor patient's progress. Noted patient likely to have hospice again if not comfort.  Hyman Hopes- RN,BSN- [facility] [phone]

## 2014-04-25 NOTE — Progress Notes (Signed)
Patient ZY:[id number] Traum      DOB: 04/15/1951      XNA:[id number]   Palliative Medicine Team at [facility] Progress Note    Subjective: Unfortunately had respiratory decline overnight. Now back on BiPAP.  Family at bedside.      Filed Vitals:   04/25/14 0821  BP: 130/55  Pulse: 96  Temp:   Resp: 17   Physical exam: General: lethargic, opens eyes to voice.   Chest: wheezing, decreased BS CVS: RRR Abdomen: soft, mild tenderness, ND Ext: no edema  CBC    Component Value Date/Time   WBC 8.1 04/23/2014 0205   RBC 3.80* 04/23/2014 0205   RBC 4.28 10/10/2009 1105   HGB 8.8* 04/23/2014 0205   HCT 30.8* 04/23/2014 0205   PLT 166 04/23/2014 0205   MCV 81.1 04/23/2014 0205   MCH 23.2* 04/23/2014 0205   MCHC 28.6* 04/23/2014 0205   RDW 14.3 04/23/2014 0205   LYMPHSABS 0.2* 04/22/2014 1600   MONOABS 0.1 04/22/2014 1600   EOSABS 0.0 04/22/2014 1600   BASOSABS 0.0 04/22/2014 1600    CMP     Component Value Date/Time   NA 144 04/25/2014 0226   K 5.1 04/25/2014 0226   CL 100 04/25/2014 0226   CO2 31 04/25/2014 0226   GLUCOSE 152* 04/25/2014 0226   BUN 23 04/25/2014 0226   CREATININE 0.85 04/25/2014 0226   CREATININE 1.04 08/27/2012 1054   CALCIUM 9.3 04/25/2014 0226   CALCIUM 9.0 07/15/2011 0920   PROT 6.0 04/22/2014 1600   ALBUMIN 3.0* 04/22/2014 1600   AST 15 04/22/2014 1600   ALT 13 04/22/2014 1600   ALKPHOS 63 04/22/2014 1600   BILITOT <0.2* 04/22/2014 1600   GFRNONAA 71* 04/25/2014 0226   GFRAA 82* 04/25/2014 0226     Assessment and plan: 63 yo female with ES-COPD (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   1. Code Status: DNR  2. Goals of Care: See previous documentation.  Unfortunately had resp decline again.  Only took small doses of PRN IV morphine and her ususal home MS contin.  Her IV morphine dose is equivalent to about 6mg  of oral morphine.  Took few doses  of ativan as well (another home med).  Will see how she responds to BiPAP. Family aware that she may not turn around from this event and we may be talking transition to comfort care. Will see how she does over next 24hours.  Originally plan was to get her home with hospice care after this acute episode. I discussed with significant other and sister at bedside.    3. Symptom Management:  1. Dyspnea: Unclear if medications playing role or if this is just her end stage Lung disease.  Will need to re-assess regimen if responds to BiPAP.   2. Depression/Anxiety: lexapro started 11/29 3. Loss of Appetite: On steroids for COPD exacerbation. Does not want to take other meds. Monitor.  4. Psychosocial/Spiritual: Lives at home with her sister. Has supportive boyfriend. Open to chaplain consult for spiritual concerns. Did much better at home with hospice support.   12/29 D.O. Palliative Medicine Team at [facility]  Pager: [phone] Team Phone: [phone]

## 2014-04-25 NOTE — Progress Notes (Signed)
PULMONARY/CRITICAL CARE MEDICINE  NAME: [name] MRN: [id number] DOB: [birth year]   ADMISSION DATE: 04/22/2014  CHIEF COMPLAINT: short of breath  BRIEF DESCRIPTION: 63 yo female with acute on chronic respiratory failure from AECOPD.  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  SIGNIFICANT EVENTS: 11/28 Admit 11/29 Palliative care consulted 11/30 Transfer to flr bed 12/01 Mental status worse  SUBJECTIVE: Increased respiratory distress overnight.  Now on BiPAP.  Mental status worse.  OBJECTIVE: Temp:  [97.4 F (36.3 C)-98.2 F (36.8 C)] 97.8 F (36.6 C) (12/01 0743) Pulse Rate:  [36-143] 96 (12/01 0821) Resp:  [15-25] 17 (12/01 0821) BP: (99-195)/(39-97) 130/55 mmHg (12/01 0821) SpO2:  [54 %-100 %] 95 % (12/01 0821) FiO2 (%):  [35 %-100 %] 100 % (12/01 0822)  General: ill appearing Neuro: obtunded, opens eyes briefly with brisk stimulation HEENT: BiPAP mask in place Chest: prolonged exhalation, diffuse b/l wheezing Cardiac: regular Abd: soft, non tender Ext: no edema  CBC Recent Labs     04/22/14  1600  04/23/14  0205  WBC  9.6  8.1  HGB  9.3*  8.8*  HCT  32.8*  30.8*  PLT  166  166    BMET Recent Labs     04/22/14  1600  04/23/14  0205  04/25/14  0226  NA  145  145  144  144  K  3.7  3.7  4.1  5.1  CL  106  106  104  100  CO2  28  29  28  31   BUN  13  13  13  23   CREATININE  0.85  0.87  0.85  0.85  GLUCOSE  105*  104*  117*  152*    Electrolytes Recent Labs     04/22/14  1600  04/23/14  0205  04/25/14  0226  CALCIUM  8.3*  8.4  8.3*  9.3  MG  1.9  1.8   --   PHOS  2.6  2.8   --     Sepsis Markers Recent Labs     04/22/14  1600  PROCALCITON  1.51    ABG Recent Labs     04/22/14  1107  04/22/14  1247  PHART  7.095*  7.295*  PCO2ART  94.7*  67.4*  PO2ART  117.0*  80.0    Liver Enzymes Recent Labs     04/22/14  1600  AST  15  ALT  13  ALKPHOS  63  BILITOT  <0.2*  ALBUMIN  3.0*    Glucose Recent  Labs     04/22/14  1350  04/23/14  0818  GLUCAP  82  116*    Imaging No results found.   IMPRESSION:  Acute on chronic hypoxic/hypercapnic respiratory 2nd to AECOPD >> much worse 12/01. Plan: Oxygen to keep SpO2 88 to 94% F/u CXR Change to duoneb q4h Change to solumedrol 60 mg q6h Continue BiPAP Day 4 of fortaz  Acute encephalopathy 12/01 likely from hypercapnia. Anxiety, chronic pain. Plan: D/c opiate, benzo's Monitor mental status  GERD. Plan: Continue protonix  Goals of care >> DNR/DNI.    SUMMARY: Respiratory and mental status much worse today.  Updated pt's family at bedside.  Explained that she is struggling to breath and does not appear to be responding to current therapies.  She has suffered from her COPD for years, and this might be terminal event.  If no improvement with adjustment to current COPD regimen, then likely transition to comfort  measures.  CC time 40 minutes.  Coralyn Helling, MD [facility] Pulmonary/Critical Care 04/25/2014, 8:53 AM Pager:  [phone] After 3pm call: [phone]

## 2014-04-25 NOTE — Progress Notes (Signed)
Request from [facility] for referral for [facility] services received yesterday afternoon; chart reviewed, overnite events and pt decline noted. [facility] will continue to follow chart updates and engage when/if appropriate.  RNCM Sarah aware. Thank you Valente David, RN MSN [facility] Liaison [phone]

## 2014-04-25 NOTE — Progress Notes (Signed)
Called pt's sister and significant other to update them on pt condition; they said they will be coming to see the pt early this morning  Marvia Pickles, RN

## 2014-04-25 NOTE — Progress Notes (Signed)
Pt o2 sat dropped to 54% on 3 lpm [place]; E-Link notified; placed on bipap, o2 sats back up to 95% on fio2 35; MD aware Marvia Pickles, RN

## 2014-04-26 ENCOUNTER — Inpatient Hospital Stay (HOSPITAL_COMMUNITY): Payer: Medicare Other

## 2014-04-26 LAB — URINE CULTURE
COLONY COUNT: NO GROWTH
Culture: NO GROWTH
SPECIAL REQUESTS: NORMAL

## 2014-04-26 LAB — CBC
HCT: 36.9 % (ref 36.0–46.0)
Hemoglobin: 10.2 g/dL — ABNORMAL LOW (ref 12.0–15.0)
MCH: 23.1 pg — ABNORMAL LOW (ref 26.0–34.0)
MCHC: 27.6 g/dL — ABNORMAL LOW (ref 30.0–36.0)
MCV: 83.7 fL (ref 78.0–100.0)
PLATELETS: 155 10*3/uL (ref 150–400)
RBC: 4.41 MIL/uL (ref 3.87–5.11)
RDW: 14.4 % (ref 11.5–15.5)
WBC: 8.6 10*3/uL (ref 4.0–10.5)

## 2014-04-26 LAB — BASIC METABOLIC PANEL
ANION GAP: 11 (ref 5–15)
BUN: 40 mg/dL — ABNORMAL HIGH (ref 6–23)
CO2: 32 meq/L (ref 19–32)
Calcium: 9.5 mg/dL (ref 8.4–10.5)
Chloride: 103 mEq/L (ref 96–112)
Creatinine, Ser: 0.94 mg/dL (ref 0.50–1.10)
GFR calc Af Amer: 73 mL/min — ABNORMAL LOW (ref >90)
GFR, EST NON AFRICAN AMERICAN: 63 mL/min — AB (ref >90)
Glucose, Bld: 130 mg/dL — ABNORMAL HIGH (ref 70–99)
Potassium: 4.9 mEq/L (ref 3.7–5.3)
Sodium: 146 mEq/L (ref 137–147)

## 2014-04-26 MED ORDER — MORPHINE SULFATE 25 MG/ML IV SOLN
2.0000 mg/h | INTRAVENOUS | Status: DC
  Administered 2014-04-26: 1 mg/h via INTRAVENOUS
  Administered 2014-04-28: 2 mg/h via INTRAVENOUS
  Filled 2014-04-26: qty 10

## 2014-04-26 MED ORDER — LORAZEPAM 2 MG/ML IJ SOLN
0.5000 mg | INTRAMUSCULAR | Status: DC | PRN
  Filled 2014-04-26: qty 1

## 2014-04-26 MED ORDER — MORPHINE BOLUS VIA INFUSION
2.0000 mg | INTRAVENOUS | Status: DC | PRN
  Administered 2014-04-26 – 2014-04-27 (×10): 2 mg via INTRAVENOUS
  Filled 2014-04-26 (×11): qty 2

## 2014-04-26 MED ORDER — LORAZEPAM 2 MG/ML IJ SOLN
0.5000 mg | Freq: Two times a day (BID) | INTRAMUSCULAR | Status: DC
  Administered 2014-04-26 – 2014-04-27 (×3): 0.5 mg via INTRAVENOUS
  Filled 2014-04-26 (×2): qty 1

## 2014-04-26 MED ORDER — ESCITALOPRAM OXALATE 10 MG PO TABS
10.0000 mg | ORAL_TABLET | Freq: Every day | ORAL | Status: DC
  Filled 2014-04-26 (×2): qty 1

## 2014-04-26 MED ORDER — LORAZEPAM 2 MG/ML IJ SOLN: 0.5000 mg | Freq: Four times a day (QID) | INTRAMUSCULAR | Status: DC | PRN

## 2014-04-26 MED ORDER — MORPHINE SULFATE 2 MG/ML IJ SOLN: 1.0000 mg | INTRAMUSCULAR | Status: DC | PRN

## 2014-04-26 NOTE — Progress Notes (Signed)
Chaplain followed up with pt. Pt seemed uncomfortable and distressed. Pt wanted to say something to chaplain but pt could not speak. Chaplain held her hand and offered silent supportive presence, occasionally offering verbal support as well. Pt seemed to doze off after a time. Will continue to follow. Page if needed.  Wille Glaser 04/26/2014 4:07 PM

## 2014-04-26 NOTE — Progress Notes (Signed)
ANTIBIOTIC CONSULT NOTE - FOLLOW UP  Pharmacy Consult for [facility] Indication: r/o AECOPD  Allergies  Allergen Reactions  . Esomeprazole Magnesium Other (See Comments)    NEXIUM - reaction > aggravated pt's Crohn's disease  . Shellfish Allergy Shortness Of Breath and Nausea And Vomiting  . Surgical Lubricant Other (See Comments)    Burns skin   . Other Nausea And Vomiting    Beans, Dander/Dust, Peas, Mushrooms  . Peanut-Containing Drug Products Nausea And Vomiting  . Sulfate     N/V  . Levaquin [Levofloxacin]     Patient Measurements: Height: 5\' 1"  (154.9 cm) Weight: 117 lb 4.6 oz (53.2 kg) IBW/kg (Calculated) : 47.8  Vital Signs: Temp: 99.1 F (37.3 C) (12/02 0759) Temp Source: Oral (12/02 0759) BP: 162/80 mmHg (12/02 1100) Pulse Rate: 105 (12/02 1100) Intake/Output from previous day: 12/01 0701 - 12/02 0700 In: 180 [I.V.:130; IV Piggyback:50] Out: 740 [Urine:740] Intake/Output from this shift: Total I/O In: 20 [I.V.:20] Out: 230 [Urine:80; Stool:150]  Labs:  Recent Labs  04/25/14 0226 04/26/14 0350  WBC  --  8.6  HGB  --  10.2*  PLT  --  155  CREATININE 0.85 0.94   Estimated Creatinine Clearance: 45.6 mL/min (by C-G formula based on Cr of 0.94). No results for input(s): VANCOTROUGH, VANCOPEAK, VANCORANDOM, GENTTROUGH, GENTPEAK, GENTRANDOM, TOBRATROUGH, TOBRAPEAK, TOBRARND, AMIKACINPEAK, AMIKACINTROU, AMIKACIN in the last 72 hours.   Microbiology: Recent Results (from the past 720 hour(s))  MRSA PCR Screening     Status: None   Collection Time: 04/22/14  2:00 PM  Result Value Ref Range Status   MRSA by PCR NEGATIVE NEGATIVE Final    Comment:        The GeneXpert MRSA Assay (FDA approved for NASAL specimens only), is one component of a comprehensive MRSA colonization surveillance program. It is not intended to diagnose MRSA infection nor to guide or monitor treatment for MRSA infections.   Culture, blood (routine x 2)     Status: None (Preliminary  result)   Collection Time: 04/22/14  3:50 PM  Result Value Ref Range Status   Specimen Description BLOOD RIGHT HAND  Final   Special Requests BOTTLES DRAWN AEROBIC ONLY 4CC  Final   Culture  Setup Time   Final    04/22/2014 21:18 Performed at 04/24/2014    Culture   Final           BLOOD CULTURE RECEIVED NO GROWTH TO DATE CULTURE WILL BE HELD FOR 5 DAYS BEFORE ISSUING A FINAL NEGATIVE REPORT Performed at [facility]    Report Status PENDING  Incomplete  Culture, blood (routine x 2)     Status: None (Preliminary result)   Collection Time: 04/22/14  4:00 PM  Result Value Ref Range Status   Specimen Description BLOOD RIGHT HAND  Final   Special Requests BOTTLES DRAWN AEROBIC ONLY 6CC  Final   Culture  Setup Time   Final    04/22/2014 21:18 Performed at 04/24/2014    Culture   Final           BLOOD CULTURE RECEIVED NO GROWTH TO DATE CULTURE WILL BE HELD FOR 5 DAYS BEFORE ISSUING A FINAL NEGATIVE REPORT Performed at [facility]    Report Status PENDING  Incomplete  Urine culture     Status: None   Collection Time: 04/23/14  2:05 AM  Result Value Ref Range Status   Specimen Description URINE, CLEAN CATCH  Final   Special  Requests NONE  Final   Culture  Setup Time   Final    04/23/2014 11:32 Performed at [facility] Count   Final    9,000 COLONIES/ML Performed at [facility]    Culture   Final    INSIGNIFICANT GROWTH Performed at [facility]    Report Status 04/24/2014 FINAL  Final    Anti-infectives    Start     Dose/Rate Route Frequency Ordered Stop   04/23/14 0400  cefTAZidime (FORTAZ) 1 g in dextrose 5 % 50 mL IVPB     1 g100 mL/hr over 30 Minutes Intravenous Every 12 hours 04/22/14 1710     04/22/14 1230  cefTAZidime (FORTAZ) 1 g in dextrose 5 % 50 mL IVPB     1 g100 mL/hr over 30 Minutes Intravenous  Once 04/22/14 1225 04/22/14 1634      Assessment: 63 year old female with acute on  chronic respiratory failure from AECOPD on day #5/7 of Fortaz. SCr stable with current CrCl~45 mL/min. WBC is within normal limits. LA 1.1. Tmax 99.1.   Elita Quick 11/28>>  11/28 Blood Cx>> NGTD 11/29 Urine Cx>> neg   Goal of Therapy:  Clinical resolution of infection  Plan:  Continue Fortaz 1g IV q12h.  Stop date added to order.  Follow-up clinical response, culture results.   Link Snuffer, PharmD, BCPS Clinical Pharmacist [phone] 04/26/2014,11:16 AM

## 2014-04-26 NOTE — Progress Notes (Signed)
Arrived to room, patient in bed restless and in distress.  Nasal O2 in use, respiratory was called to initiate bi-pap orders as written.  Chart reviewed.  Symptom management and respiratory issues are paramount at this point.  Spoke with RNCM, patient has a sister who helps with care at home, she usually visits the patient in the hospital but has not arrived yet today.    Discharge planning will depend on symptom control.  Inpatient hospice facility would be most appropriate to manage symptoms once a schedule for management has been established.  Consulted with Dr. Phillips Odor, orders for MS gtt with PRN bolus and scheduled Ativan placed.  Patient will likely tolerate N/C instead of bi-pap once adequate sedation is achieved.    Spoke with primary RN with specifics of orders for symptom management.  Will follow and engage with any d/c plans as necessary.  Cindra Presume, RN, MSN, [facility] Palliative Integration

## 2014-04-26 NOTE — Progress Notes (Signed)
PULMONARY/CRITICAL CARE MEDICINE  NAME: [name] MRN: [id number] DOB: 1950/12/03   ADMISSION DATE: 04/22/2014  CHIEF COMPLAINT: short of breath  BRIEF DESCRIPTION: 63 yo female with acute on chronic respiratory failure from AECOPD.  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  SIGNIFICANT EVENTS: 11/28 Admit 11/29 Palliative care consulted 11/30 Transfer to flr bed 12/01 Mental status worse  SUBJECTIVE: Appears depressed.  Remains on BiPAP.  OBJECTIVE: Temp:  [97.6 F (36.4 C)-99.1 F (37.3 C)] 99.1 F (37.3 C) (12/02 0759) Pulse Rate:  [96-131] 105 (12/02 0900) Resp:  [10-28] 12 (12/02 0900) BP: (92-171)/(36-86) 144/60 mmHg (12/02 0900) SpO2:  [99 %-100 %] 100 % (12/02 0900) FiO2 (%):  [40 %-100 %] 60 % (12/02 0900)  General: ill appearing Neuro: more alert HEENT: BiPAP mask in place Chest: prolonged exhalation, no wheeze Cardiac: regular Abd: soft, non tender Ext: no edema  CBC Recent Labs     04/26/14  0350  WBC  8.6  HGB  10.2*  HCT  36.9  PLT  155    BMET Recent Labs     04/25/14  0226  04/26/14  0350  NA  144  146  K  5.1  4.9  CL  100  103  CO2  31  32  BUN  23  40*  CREATININE  0.85  0.94  GLUCOSE  152*  130*    Electrolytes Recent Labs     04/25/14  0226  04/26/14  0350  CALCIUM  9.3  9.5    Imaging Dg Chest Port 1 View  04/26/2014   CLINICAL DATA:  Shortness of breath.  COPD.  EXAM: PORTABLE CHEST - 1 VIEW  COMPARISON:  04/23/2014.  FINDINGS: Mediastinum and hilar structures are normal. Heart size normal. Bibasilar atelectasis and/or pulmonary infiltrates. Left upper lobe subsegmental atelectasis. These findings are new. Tiny pleural effusions cannot be excluded. No pneumothorax. No acute osseous abnormality.  IMPRESSION: 1. Development of bibasilar atelectasis and/or infiltrates. 2. Subsequent atelectasis left upper lobe. 3. Tiny bilateral pleural effusions.   Electronically Signed   By: Maisie Fus  Register   On: 04/26/2014  07:17     IMPRESSION:  Acute on chronic hypoxic/hypercapnic respiratory 2nd to AECOPD, ATX. Plan: Oxygen to keep SpO2 88 to 94% F/u CXR Change to duoneb q4h Change to solumedrol 60 mg q6h qhs and prn during day BiPAP Day 5/7 of fortaz Morphine prn for dyspnea  Acute encephalopathy 12/01 likely from hypercapnia >> improved 12/02 after BiPAP. Anxiety, chronic pain. Depression. Plan: D/c opiate, benzo's Monitor mental status Lexapro  GERD. Plan: Continue protonix  Goals of care >> DNR/DNI.  Palliative care following, and assessing for hospice care.  SUMMARY: She has marginal respiratory status at baseline.  She has some improvement with BiPAP, but this is only temporizing measure.  Will see how she does with transition to intermittent BiPAP use.  Coralyn Helling, MD [facility] Pulmonary/Critical Care 04/26/2014, 10:54 AM Pager:  [phone] After 3pm call: [phone]

## 2014-04-27 MED ORDER — ACETAMINOPHEN 650 MG RE SUPP
650.0000 mg | RECTAL | Status: DC | PRN
Start: 2014-04-27 — End: 2014-04-28
  Administered 2014-04-28: 650 mg via RECTAL
  Filled 2014-04-27: qty 1

## 2014-04-27 MED ORDER — SCOPOLAMINE 1 MG/3DAYS TD PT72
1.0000 | MEDICATED_PATCH | TRANSDERMAL | Status: DC
  Administered 2014-04-27: 1.5 mg via TRANSDERMAL
  Filled 2014-04-27: qty 1

## 2014-04-27 MED ORDER — LORAZEPAM 2 MG/ML IJ SOLN: 1.0000 mg | INTRAMUSCULAR | Status: DC | PRN

## 2014-04-27 NOTE — Progress Notes (Signed)
Met with family at bedside, sister, cousin and significant other.  Support offered.  Family verbalized feelings of sadness although they agree that she appears much more comfortable at this time, not struggling and peaceful.  Chaplain services have visited and offered prayer shawl and prayers that appeared comforting for the family.  Morphine gtt in progress at 78m/hr.  PRN MS is being utilized for symptom management.  Bi-pap has been d/c, O2 is via n/c.  No signs of distress at this time, skin warm and dry, mouth breathing, RR about 16, SPO2 via monitor is 97%  PCCM will transfer patient out to 6N when bed available for continued comfort measures.  Will follow as needed for support or symptom management.  SLudwig Clarks MSN, CJohn [facility] RehabilitationPalliative Integration

## 2014-04-27 NOTE — Progress Notes (Signed)
Chaplain responded to consult.  Pt's family at bedside.  Family shared that pt is "strong willed," and indicated that their faith is important to them.  Family asked for a Bible which chaplain provided.  Chaplain also offered a prayer shawl which seemed to be of great comfort to the family.  Pt is now on comfort care.  Chaplain provided emotional and spiritual support as well as ministry of prayer, hospitality and presence.  Chaplains will continue to follow up as needed.    04/27/14 1330  Clinical Encounter Type  Visited With Patient and family together  Visit Type Initial;Spiritual support;Critical Care;Patient actively dying  Referral From Physician  Spiritual Encounters  Spiritual Needs Sacred text;Prayer;Ritual;Emotional;Grief support  Stress Factors  Patient Stress Factors Health changes  Family Stress Factors Family relationships;Health changes   Erroll Luna, Chaplain

## 2014-04-27 NOTE — Progress Notes (Signed)
Patient transferred to [facility] for continued comfort care and EOL symptom management.  Theo, patient significant other present.  Offered support and listening as Danice Goltz acknowledges that he knows Aubrei is going to pass away and he is ready, discussing that he has seen many loved ones pass away.  I assured him that he does not have to be alone in the room as the time draws close, encouraged him to request chaplain when needed.  MAR review noted that bolus Morphine has been utilized enough to warrant increase in basal rate.Dr. Phillips Odor notified  MS gtt increased to 2mg /hr with bolus PRN still available.  Faint respiratory congestion was audible at bedside, scopolamine patch ordered as well.  Orders reviewed with primary RN with goal of optimum symptom management.  Will follow for support and management  , RN, MSN, [facility] Palliative Integration

## 2014-04-27 NOTE — ED Provider Notes (Signed)
CSN: [id number]     Arrival date & time 04/22/14  1036 History   First MD Initiated Contact with Patient 04/22/14 1101     Chief Complaint  Patient presents with  . Respiratory Distress     (Consider location/radiation/quality/duration/timing/severity/associated sxs/prior Treatment) HPI  Expand All Collapse All   Pt. Woke up with sob approximately at 0700 am, and has progressively became worse. Pt. Arrived on cpap and received albuterol treatments, 5 mg in route. Pt. Also received Solumedrol  IV and Zofran  IV . Pt. Using accessory muscles and having lower abdominal pain. Pt. Took  Albuterol treatment prior to EMS arrival. ST on the monitor. Pt.'s LOC is diminished.  Patient has a signed DO NOT INTUBATE order dated March 2015.        Past Medical History  Diagnosis Date  . Other diseases of vocal cords   . Osteoporosis, unspecified   . Other and unspecified hyperlipidemia   . Esophageal reflux   . Chronic airway obstruction, not elsewhere classified   . Regional enteritis of unspecified site   . 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 Crohn's disease     Ileostomy  . Pelvic adhesions   . Anxiety   . Depression   . Hypertension   . Emphysema   . Hematuria   . Severe cervical dysplasia age 25    cone biopsy with neg paps since  . Pneumonia     "3-4 times; necrotizing once" (04/19/2013)  . Asthmatic bronchitis , chronic   . Exertional shortness of breath   . On home oxygen therapy     "24/7; 2.5L @ rest; 3L when I'm doing my chores" (08/02/2013)  . Type II diabetes mellitus   . Anemia   . History of blood transfusion     "I've had 3 or 4" (04/19/2013)  . Rheumatoid arthritis(714.0) 03/2012    [place]; "mild in my hands" (04/19/2013)  . Small bowel obstruction due to adhesions 2014    [place]  . Pelvic abscess in female 2014   Past Surgical History  Procedure Laterality Date  . Ileostomy  2007  . Ear cyst excision Bilateral  1970's    "lobes"   . Cervical cone biopsy  1978  . Colon surgery  2007    subtotal colectomy with ileostomy  . Esophagogastroduodenoscopy (egd) with esophageal dilation      "3 times, I think" (04/19/2013)  . Oophorectomy      LSO  . Tracheostomy  2010  . Hammer toe surgery Bilateral 1980's  . Balloon dilation  02/10/2012    Procedure: BALLOON DILATION;  Surgeon: Charolett Bumpers, MD;  Location: [facility] ENDOSCOPY;  Service: Endoscopy;  Laterality: N/A;  . Colposcopy    . Flexible sigmoidoscopy  03/26/2012    Procedure: FLEXIBLE SIGMOIDOSCOPY;  Surgeon: Charolett Bumpers, MD;  Location: [facility] ENDOSCOPY;  Service: Endoscopy;  Laterality: N/A;  . Cardiac catheterization  07/08/2007    normal  . Nm myocar perf wall motion  09/21/2006    no ischemia  . Ventral hernia repair N/A 03/16/2013    Procedure: REPAIR OF INCISIONAL AND PARASTOMAL HERNIAS;  Surgeon: Mariella Saa, MD;  Location: [facility] OR;  Service: General;  Laterality: N/A;  . Laparotomy N/A 03/16/2013    Procedure: EXPLORATORY LAPAROTOMY;  Surgeon: Mariella Saa, MD;  Location[facility] OR;  Service: General;  Laterality: N/A;  . Lysis of adhesion N/A 03/16/2013    Procedure: LYSIS OF ADHESIONS FOR SMALL BOWEL OBSTRUCTION;  Surgeon:  Mariella Saa, MD;  Location: [facility] OR;  Service: General;  Laterality: N/A;  . Insertion of mesh N/A 03/16/2013    Procedure: INSERTION OF BIOLOGIC MESH;  Surgeon: Mariella Saa, MD;  Location: [facility] OR;  Service: General;  Laterality: N/A;  . Tee without cardioversion N/A 03/21/2013    Procedure: TRANSESOPHAGEAL ECHOCARDIOGRAM (TEE);  Surgeon: Vesta Mixer, MD;  Location: [facility] ENDOSCOPY;  Service: Cardiovascular;  Laterality: N/A;  . Vaginal hysterectomy  1993    LAVH-LSO  . Tracheostomy closure  2010  . Hernia repair     Family History  Problem Relation Age of Onset  . Osteoporosis Mother   . Kidney disease Mother   . Hypertension Sister   . Stroke Sister   . Diabetes Sister    History   Substance Use Topics  . Smoking status: Former Smoker -- 1.00 packs/day for 32 years    Types: Cigarettes    Quit date: 05/26/2004  . Smokeless tobacco: Never Used  . Alcohol Use: 0.0 oz/week     Comment: 08/02/2013 "have a drink a couple times/yr"   OB History    Gravida Para Term Preterm AB TAB SAB Ectopic Multiple Living   3 0   3     0     Review of Systems  Level V caveat  Allergies  Esomeprazole magnesium; Shellfish allergy; Surgical lubricant; Other; Peanut-containing drug products; Sulfate; and Levaquin  Home Medications   Prior to Admission medications   Medication Sig Start Date End Date Taking? Authorizing Provider  Amino Acids-Protein Hydrolys (PRO-STAT) LIQD TAKE 30 MLS BY MOUTH DAILY. 04/14/14  Yes Storm Frisk, MD  arformoterol (BROVANA) 15 MCG/2ML NEBU Take 2 mLs (15 mcg total) by nebulization 2 (two) times daily. 04/06/14  Yes Storm Frisk, MD  aspirin 81 MG tablet Take 81 mg by mouth daily.    Yes Historical Provider, MD  budesonide (PULMICORT) 0.25 MG/2ML nebulizer solution Take 2 mLs (0.25 mg total) by nebulization 2 (two) times daily. 04/06/14  Yes Storm Frisk, MD  calcium citrate-vitamin D (CITRACAL+D) 315-200 MG-UNIT per tablet Take 1 tablet by mouth 2 (two) times daily.    Yes Historical Provider, MD  celecoxib (CELEBREX) 200 MG capsule Take 200 mg by mouth daily.   Yes Historical Provider, MD  famotidine (PEPCID) 20 MG tablet Take 20 mg by mouth 2 (two) times daily.   Yes Historical Provider, MD  fluticasone (FLONASE) 50 MCG/ACT nasal spray Place 2 sprays into both nostrils 2 (two) times daily. 04/14/14  Yes Storm Frisk, MD  furosemide (LASIX) 20 MG tablet Take 20 mg by mouth daily as needed for fluid.    Yes Historical Provider, MD  ipratropium-albuterol (DUONEB) 0.5-2.5 (3) MG/3ML SOLN Take 3 mLs by nebulization every 4 (four) hours as needed. 04/19/14  Yes Storm Frisk, MD  LORazepam (ATIVAN) 0.5 MG tablet 1 tab by mouth three times  daily as needed Patient taking differently: Take 0.5 mg by mouth every 4 (four) hours as needed for anxiety. 1 tab by mouth three times daily as needed 04/04/14  Yes Tammy S Parrett, NP  metoprolol tartrate (LOPRESSOR) 25 MG tablet Take 0.5 tablets (12.5 mg total) by mouth 2 (two) times daily. 02/27/14  Yes Storm Frisk, MD  morphine (MS CONTIN) 30 MG 12 hr tablet Take 1 tablet (30 mg total) by mouth every 12 (twelve) hours. 04/04/14  Yes Tammy S Parrett, NP  morphine (MSIR) 15 MG tablet Take 15 mg by  mouth every 4 (four) hours as needed for severe pain.   Yes Historical Provider, MD  MUCINEX 600 MG 12 hr tablet TAKE 2 TABLETS TWICE A DAY 11/16/13  Yes Storm Frisk, MD  omeprazole (PRILOSEC) 40 MG capsule Take 1 capsule by mouth 2 (two) times daily. 03/06/14  Yes Historical Provider, MD  predniSONE (DELTASONE) 20 MG tablet Take 20 mg by mouth daily with breakfast.   Yes Historical Provider, MD  PROAIR HFA 108 (90 BASE) MCG/ACT inhaler Inhale 2 puffs into the lungs every 6 (six) hours as needed for wheezing or shortness of breath.    Yes Historical Provider, MD  Respiratory Therapy Supplies (FLUTTER) DEVI Inhale into the lungs. 2-4 times daily   Yes Historical Provider, MD   BP 160/54 mmHg  Pulse 115  Temp(Src) 97.8 F (36.6 C) (Axillary)  Resp 16  Ht  (1.549 m)  Wt 117 lb 4.6 oz (53.2 kg)  BMI 22.17 kg/m2  SpO2 99% Physical Exam  Constitutional: She appears well-developed and well-nourished. She appears lethargic. She appears distressed.  HENT:  Head: Normocephalic and atraumatic.  Eyes: Pupils are equal, round, and reactive to light.  Neck: Normal range of motion.  Cardiovascular: Normal rate and intact distal pulses.   Pulmonary/Chest: She is in respiratory distress. She has wheezes.  Abdominal: Normal appearance. She exhibits no distension.  Musculoskeletal: Normal range of motion.  Neurological: She appears lethargic. No cranial nerve deficit.  Skin: Skin is warm and  dry. No rash noted.  Nursing note and vitals reviewed.   ED Course  Procedures (including critical care time)  FiO2 was reduced to 28% and patient given racemic epinephrine via nebulized treatment.  Critical care was contacted.  After approximately 1 hour patient made remarkable improvement became much more awake and was moving more air.  Wheezes became more audible.  She was actually speaking prior to admission.  CRITICAL CARE Performed by: Nelia Shi Total critical care time: 45 min Critical care time was exclusive of separately billable procedures and treating other patients. Critical care was necessary to treat or prevent imminent or life-threatening deterioration. Critical care was time spent personally by me on the following activities: development of treatment plan with patient and/or surrogate as well as nursing, discussions with consultants, evaluation of patient's response to treatment, examination of patient, obtaining history from patient or surrogate, ordering and performing treatments and interventions, ordering and review of laboratory studies, ordering and review of radiographic studies, pulse oximetry and re-evaluation of patient's condition.  Labs Reviewed  BLOOD GAS, ARTERIAL - Abnormal; Notable for the following:    pH, Arterial 7.095 (*)    pCO2 arterial 94.7 (*)    pO2, Arterial 117.0 (*)    Bicarbonate 27.8 (*)    All other components within normal limits  COMPREHENSIVE METABOLIC PANEL - Abnormal; Notable for the following:    Glucose, Bld 105 (*)    Calcium 8.3 (*)    Albumin 3.0 (*)    Total Bilirubin <0.2 (*)    GFR calc non Af Amer 71 (*)    GFR calc Af Amer 82 (*)    All other components within normal limits  CBC WITH DIFFERENTIAL - Abnormal; Notable for the following:    Hemoglobin 9.3 (*)    HCT 32.8 (*)    MCH 23.3 (*)    MCHC 28.4 (*)    Neutrophils Relative % 97 (*)    Neutro Abs 9.3 (*)    Lymphocytes Relative 2 (*)  Lymphs Abs 0.2 (*)     Monocytes Relative 1 (*)    All other components within normal limits  URINALYSIS, ROUTINE W REFLEX MICROSCOPIC - Abnormal; Notable for the following:    Glucose, UA 100 (*)    Nitrite POSITIVE (*)    Leukocytes, UA SMALL (*)    All other components within normal limits  BASIC METABOLIC PANEL - Abnormal; Notable for the following:    Glucose, Bld 104 (*)    GFR calc non Af Amer 69 (*)    GFR calc Af Amer 80 (*)    All other components within normal limits  CBC - Abnormal; Notable for the following:    RBC 3.80 (*)    Hemoglobin 8.8 (*)    HCT 30.8 (*)    MCH 23.2 (*)    MCHC 28.6 (*)    All other components within normal limits  BASIC METABOLIC PANEL - Abnormal; Notable for the following:    Glucose, Bld 117 (*)    Calcium 8.3 (*)    GFR calc non Af Amer 71 (*)    GFR calc Af Amer 82 (*)    All other components within normal limits  URINE MICROSCOPIC-ADD ON - Abnormal; Notable for the following:    Squamous Epithelial / LPF FEW (*)    All other components within normal limits  GLUCOSE, CAPILLARY - Abnormal; Notable for the following:    Glucose-Capillary 116 (*)    All other components within normal limits  BASIC METABOLIC PANEL - Abnormal; Notable for the following:    Glucose, Bld 152 (*)    GFR calc non Af Amer 71 (*)    GFR calc Af Amer 82 (*)    All other components within normal limits  URINALYSIS, ROUTINE W REFLEX MICROSCOPIC - Abnormal; Notable for the following:    APPearance CLOUDY (*)    Hgb urine dipstick TRACE (*)    Protein, ur 30 (*)    Leukocytes, UA SMALL (*)    All other components within normal limits  URINE MICROSCOPIC-ADD ON - Abnormal; Notable for the following:    Squamous Epithelial / LPF FEW (*)    Bacteria, UA FEW (*)    All other components within normal limits  BASIC METABOLIC PANEL - Abnormal; Notable for the following:    Glucose, Bld 130 (*)    BUN 40 (*)    GFR calc non Af Amer 63 (*)    GFR calc Af Amer 73 (*)    All other  components within normal limits  CBC - Abnormal; Notable for the following:    Hemoglobin 10.2 (*)    MCH 23.1 (*)    MCHC 27.6 (*)    All other components within normal limits  I-STAT ARTERIAL BLOOD GAS, ED - Abnormal; Notable for the following:    pH, Arterial 7.295 (*)    pCO2 arterial 67.4 (*)    Bicarbonate 32.8 (*)    Acid-Base Excess 5.0 (*)    All other components within normal limits  CULTURE, BLOOD (ROUTINE X 2)  CULTURE, BLOOD (ROUTINE X 2)  URINE CULTURE  MRSA PCR SCREENING  URINE CULTURE  MAGNESIUM  PHOSPHORUS  AMYLASE  LIPASE, BLOOD  LACTIC ACID, PLASMA  PROCALCITONIN  STREP PNEUMONIAE URINARY ANTIGEN  LEGIONELLA ANTIGEN, URINE  GLUCOSE, CAPILLARY  MAGNESIUM  PHOSPHORUS    Imaging Review Dg Chest Port 1 View  04/26/2014   CLINICAL DATA:  Shortness of breath.  COPD.  EXAM: PORTABLE CHEST -  1 VIEW  COMPARISON:  04/23/2014.  FINDINGS: Mediastinum and hilar structures are normal. Heart size normal. Bibasilar atelectasis and/or pulmonary infiltrates. Left upper lobe subsegmental atelectasis. These findings are new. Tiny pleural effusions cannot be excluded. No pneumothorax. No acute osseous abnormality.  IMPRESSION: 1. Development of bibasilar atelectasis and/or infiltrates. 2. Subsequent atelectasis left upper lobe. 3. Tiny bilateral pleural effusions.   Electronically Signed   By: Maisie Fus  Register   On: 04/26/2014 07:17     EKG Interpretation   Date/Time:  Saturday April 22 2014 10:43:41 EST Ventricular Rate:  135 PR Interval:  143 QRS Duration: 68 QT Interval:  295 QTC Calculation: 442 R Axis:   20 Text Interpretation:  Sinus tachycardia Paired ventricular premature  complexes LAE, consider biatrial enlargement ED PHYSICIAN INTERPRETATION  AVAILABLE IN [facility] Confirmed by TEST, Record ([id number]) on  04/24/2014 7:08:21 AM      MDM   Final diagnoses:  SOB (shortness of breath)  Respiratory failure with hypercapnia, unspecified chronicity         Nelia Shi, MD 04/27/14 [phone]

## 2014-04-27 NOTE — Progress Notes (Signed)
Pt taken off Bipap per RT for comfort measures. Placed on 2L [place], RN aware.

## 2014-04-27 NOTE — Progress Notes (Signed)
  [name] is a 63 y.o. 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  She was started on therapy for her AECOPD, including BiPAP.  Palliative care was consulted.  Her respiratory status became much worse.  Family conference was held.  She was made DNR, and transitioned to comfort measures only on 12/03.  SUBJECTIVE: Unable to come off bipap.  OBJECTIVE: Temp:  [97.4 F (36.3 C)-99.3 F (37.4 C)] 97.8 F (36.6 C) (12/03 0819) Pulse Rate:  [103-134] 115 (12/03 0800) Resp:  [9-25] 16 (12/03 0800) BP: (102-172)/(54-96) 160/54 mmHg (12/03 0800) SpO2:  [96 %-100 %] 99 % (12/03 0817) FiO2 (%):  [40 %-60 %] 40 % (12/03 0817)  General: ill appearing Neuro: comatose HEENT: BiPAP mask in place Chest: prolonged exhalation, no wheeze Cardiac: regular Abd: soft, non tender Ext: no edema   IMPRESSION:  Acute on chronic hypoxic/hypercapnic respiratory 2nd to AECOPD, ATX. Acute encephalopathy 12/01 likely from hypercapnia >> improved 12/02 after BiPAP. Anxiety, chronic pain. Depression. GERD. DNR/DNI.  PLAN:  Transition to comfort measures 12/03 once family arrives Will transfer to palliative care room in hospital Continue morphine gtt and prn ativan  Updated pt's sister over the phone, and explained plan of care.  Reviewed dying process, explained difficult to predict how long this can take, but my expectation is that she will not survive the next 24 to 48 hrs.  Coralyn Helling, MD [facility] Pulmonary/Critical Care 04/27/2014, 9:54 AM Pager:  [phone] After 3pm call: [phone]

## 2014-04-28 ENCOUNTER — Inpatient Hospital Stay (HOSPITAL_COMMUNITY)
Admission: AD | Admit: 2014-04-28 | Discharge: 2014-05-26 | DRG: 190 | Disposition: E | Source: Ambulatory Visit | Attending: Critical Care Medicine | Admitting: Critical Care Medicine

## 2014-04-28 DIAGNOSIS — E785 Hyperlipidemia, unspecified: Secondary | ICD-10-CM | POA: Diagnosis present

## 2014-04-28 DIAGNOSIS — G9349 Other encephalopathy: Secondary | ICD-10-CM | POA: Diagnosis present

## 2014-04-28 DIAGNOSIS — Z881 Allergy status to other antibiotic agents status: Secondary | ICD-10-CM

## 2014-04-28 DIAGNOSIS — Z87891 Personal history of nicotine dependence: Secondary | ICD-10-CM

## 2014-04-28 DIAGNOSIS — Z888 Allergy status to other drugs, medicaments and biological substances status: Secondary | ICD-10-CM

## 2014-04-28 DIAGNOSIS — Z9981 Dependence on supplemental oxygen: Secondary | ICD-10-CM

## 2014-04-28 DIAGNOSIS — J439 Emphysema, unspecified: Secondary | ICD-10-CM | POA: Diagnosis present

## 2014-04-28 DIAGNOSIS — K219 Gastro-esophageal reflux disease without esophagitis: Secondary | ICD-10-CM | POA: Diagnosis present

## 2014-04-28 DIAGNOSIS — Z91018 Allergy to other foods: Secondary | ICD-10-CM | POA: Diagnosis not present

## 2014-04-28 DIAGNOSIS — J9620 Acute and chronic respiratory failure, unspecified whether with hypoxia or hypercapnia: Secondary | ICD-10-CM | POA: Diagnosis not present

## 2014-04-28 DIAGNOSIS — F418 Other specified anxiety disorders: Secondary | ICD-10-CM | POA: Diagnosis present

## 2014-04-28 DIAGNOSIS — Z66 Do not resuscitate: Secondary | ICD-10-CM | POA: Diagnosis present

## 2014-04-28 DIAGNOSIS — Z91010 Allergy to peanuts: Secondary | ICD-10-CM | POA: Diagnosis not present

## 2014-04-28 DIAGNOSIS — Z515 Encounter for palliative care: Secondary | ICD-10-CM

## 2014-04-28 DIAGNOSIS — J441 Chronic obstructive pulmonary disease with (acute) exacerbation: Principal | ICD-10-CM | POA: Diagnosis present

## 2014-04-28 DIAGNOSIS — Z91013 Allergy to seafood: Secondary | ICD-10-CM | POA: Diagnosis not present

## 2014-04-28 DIAGNOSIS — M069 Rheumatoid arthritis, unspecified: Secondary | ICD-10-CM | POA: Diagnosis present

## 2014-04-28 DIAGNOSIS — Z833 Family history of diabetes mellitus: Secondary | ICD-10-CM | POA: Diagnosis not present

## 2014-04-28 DIAGNOSIS — R4020 Unspecified coma: Secondary | ICD-10-CM | POA: Diagnosis present

## 2014-04-28 DIAGNOSIS — Z823 Family history of stroke: Secondary | ICD-10-CM | POA: Diagnosis not present

## 2014-04-28 DIAGNOSIS — M810 Age-related osteoporosis without current pathological fracture: Secondary | ICD-10-CM | POA: Diagnosis present

## 2014-04-28 DIAGNOSIS — Z8249 Family history of ischemic heart disease and other diseases of the circulatory system: Secondary | ICD-10-CM | POA: Diagnosis not present

## 2014-04-28 DIAGNOSIS — E119 Type 2 diabetes mellitus without complications: Secondary | ICD-10-CM | POA: Diagnosis present

## 2014-04-28 DIAGNOSIS — J438 Other emphysema: Secondary | ICD-10-CM

## 2014-04-28 LAB — CULTURE, BLOOD (ROUTINE X 2)
CULTURE: NO GROWTH
Culture: NO GROWTH

## 2014-04-28 MED ORDER — MORPHINE BOLUS VIA INFUSION
3.0000 mg | INTRAVENOUS | Status: DC | PRN
  Filled 2014-04-28: qty 3

## 2014-04-28 MED ORDER — SODIUM CHLORIDE 0.9 % IV SOLN: 250.0000 mL | INTRAVENOUS | Status: DC | PRN

## 2014-04-28 MED ORDER — SODIUM CHLORIDE 0.9 % IJ SOLN: 3.0000 mL | Freq: Two times a day (BID) | INTRAMUSCULAR | Status: DC

## 2014-04-28 MED ORDER — ATROPINE SULFATE 1 % OP SOLN
OPHTHALMIC | Status: AC
  Filled 2014-04-28: qty 2

## 2014-04-28 MED ORDER — SODIUM CHLORIDE 0.9 % IJ SOLN: 3.0000 mL | INTRAMUSCULAR | Status: DC | PRN

## 2014-04-28 MED ORDER — SODIUM CHLORIDE 0.9 % IV SOLN
1.0000 mg/h | INTRAVENOUS | Status: DC
  Administered 2014-04-28: 3 mg/h via INTRAVENOUS
  Filled 2014-04-28: qty 10

## 2014-04-28 MED ORDER — ATROPINE SULFATE 1 % OP SOLN: 2.0000 [drp] | OPHTHALMIC | Status: DC | PRN

## 2014-04-28 NOTE — Progress Notes (Signed)
  [name] is a 63 y.o. 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  She was started on therapy for her AECOPD, including BiPAP.  Palliative care was consulted.  Her respiratory status became much worse.  Family conference was held.  She was made DNR, and transitioned to comfort measures only on 12/03.  SUBJECTIVE: On 6N full comfort care in place   OBJECTIVE: Temp:  [97.8 F (36.6 C)-97.9 F (36.6 C)] 97.9 F (36.6 C) (12/03 1540) Pulse Rate:  [120-129] 129 (12/03 1540) Resp:  [12-18] 18 (12/03 1540) BP: (140)/(82) 140/82 mmHg (12/03 1540) SpO2:  [93 %-99 %] 99 % (12/03 1540) Weight:  [121 lb 3.2 oz (54.976 kg)] 121 lb 3.2 oz (54.976 kg) (12/03 1540)  General: ill appearing Neuro: comatose HEENT: on [place] Chest: prolonged exhalation, no wheeze Cardiac: regular Abd: soft, non tender Ext: no edema   IMPRESSION:  Acute on chronic hypoxic/hypercapnic respiratory 2nd to AECOPD, ATX. Acute encephalopathy 12/01 likely from hypercapnia  Anxiety, chronic pain. Depression. GERD. DNR/DNI. Now full comfort care   PLAN: Increase morphine and bolus to 3mg /hr Do not expect pt to survive to discharge.   [facility]monary/Critical Care 05-28-14, 9:51 AM Beeper  [phone]  Cell  [phone]  If no response or cell goes to voicemail, call beeper [phone]

## 2014-04-28 NOTE — H&P (Signed)
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 Now on morphine drip  Pt adm for abd pain and copd exac now full dnr dni and comfort care  status  PAST MEDICAL HISTORY :   has a past medical history of Other diseases of vocal cords; Osteoporosis, unspecified; Other and unspecified hyperlipidemia; Esophageal reflux; Chronic airway obstruction, not elsewhere classified; Regional enteritis of unspecified site; Obstructive chronic bronchitis without exacerbation; Crohn's disease; Pelvic adhesions; Anxiety; Depression; Hypertension; Emphysema; Hematuria; Severe cervical dysplasia (age 23); Pneumonia; Asthmatic bronchitis , chronic; Exertional shortness of breath; On home oxygen therapy; Type II diabetes mellitus; Anemia; History of blood transfusion; Rheumatoid arthritis(714.0) (03/2012); Small bowel obstruction due to adhesions (2014); and Pelvic abscess in female (2014).  has past surgical history that includes Ileostomy (2007); Ear Cyst Excision (Bilateral, 1970's); Cervical cone biopsy (1978); Colon surgery (2007); Esophagogastroduodenoscopy (egd) with esophageal dilation; Oophorectomy; Tracheostomy (2010); Hammer toe surgery (Bilateral, 1980's); Balloon dilation (02/10/2012); Colposcopy; Flexible sigmoidoscopy (03/26/2012); Cardiac catheterization (07/08/2007); nm myocar perf wall motion (09/21/2006); Ventral hernia repair (N/A, 03/16/2013); laparotomy (N/A, 03/16/2013); Lysis of adhesion (N/A, 03/16/2013); Insertion of mesh (N/A, 03/16/2013); TEE without cardioversion (N/A, 03/21/2013); Vaginal hysterectomy (1993); Tracheostomy closure (2010); and Hernia repair. Prior to Admission medications   Medication Sig Start Date End Date Taking? Authorizing Provider  Amino Acids-Protein Hydrolys  (PRO-STAT) LIQD TAKE 30 MLS BY MOUTH DAILY. 04/14/14   Storm Frisk, MD  arformoterol (BROVANA) 15 MCG/2ML NEBU Take 2 mLs (15 mcg total) by nebulization 2 (two) times daily. 04/06/14   Storm Frisk, MD  aspirin 81 MG tablet Take 81 mg by mouth daily.     Historical Provider, MD  budesonide (PULMICORT) 0.25 MG/2ML nebulizer solution Take 2 mLs (0.25 mg total) by nebulization 2 (two) times daily. 04/06/14   Storm Frisk, MD  calcium citrate-vitamin D (CITRACAL+D) 315-200 MG-UNIT per tablet Take 1 tablet by mouth 2 (two) times daily.     Historical Provider, MD  celecoxib (CELEBREX) 200 MG capsule Take 200 mg by mouth daily.    Historical Provider, MD  famotidine (PEPCID) 20 MG tablet Take 20 mg by mouth 2 (two) times daily.    Historical Provider, MD  fluticasone (FLONASE) 50 MCG/ACT nasal spray Place 2 sprays into both nostrils 2 (two) times daily. 04/14/14   Storm Frisk, MD  furosemide (LASIX) 20 MG tablet Take 20 mg by mouth daily as needed for fluid.     Historical Provider, MD  ipratropium-albuterol (DUONEB) 0.5-2.5 (3) MG/3ML SOLN Take 3 mLs by nebulization every 4 (four) hours as needed. 04/19/14   Storm Frisk, MD  LORazepam (ATIVAN) 0.5 MG tablet 1 tab by mouth three times daily as needed Patient taking differently: Take 0.5 mg by mouth every 4 (four) hours as needed for anxiety. 1 tab by mouth three times daily as needed 04/04/14   Tammy S Parrett, NP  metoprolol tartrate (LOPRESSOR) 25 MG tablet Take 0.5 tablets (12.5 mg total) by mouth 2 (two) times daily. 02/27/14   Storm Frisk, MD  morphine (MS CONTIN) 30 MG 12 hr tablet Take 1 tablet (30 mg total) by mouth every 12 (twelve) hours. 04/04/14   Tammy S Parrett, NP  morphine (MSIR) 15 MG tablet Take  15 mg by mouth every 4 (four) hours as needed for severe pain.    Historical Provider, MD  MUCINEX 600 MG 12 hr tablet TAKE 2 TABLETS TWICE A DAY 11/16/13   Storm Frisk, MD  omeprazole (PRILOSEC) 40 MG capsule Take 1  capsule by mouth 2 (two) times daily. 03/06/14   Historical Provider, MD  predniSONE (DELTASONE) 20 MG tablet Take 20 mg by mouth daily with breakfast.    Historical Provider, MD  PROAIR HFA 108 (90 BASE) MCG/ACT inhaler Inhale 2 puffs into the lungs every 6 (six) hours as needed for wheezing or shortness of breath.     Historical Provider, MD  Respiratory Therapy Supplies (FLUTTER) DEVI Inhale into the lungs. 2-4 times daily    Historical Provider, MD   Allergies  Allergen Reactions  . Esomeprazole Magnesium Other (See Comments)    NEXIUM - reaction > aggravated pt's Crohn's disease  . Shellfish Allergy Shortness Of Breath and Nausea And Vomiting  . Surgical Lubricant Other (See Comments)    Burns skin   . Other Nausea And Vomiting    Beans, Dander/Dust, Peas, Mushrooms  . Peanut-Containing Drug Products Nausea And Vomiting  . Sulfate     N/V  . Levaquin [Levofloxacin]     FAMILY HISTORY:  family history includes Diabetes in her sister; Hypertension in her sister; Kidney disease in her mother; Osteoporosis in her mother; Stroke in her sister. SOCIAL HISTORY:  reports that she quit smoking about 9 years ago. Her smoking use included Cigarettes. She has a 32 pack-year smoking history. She has never used smokeless tobacco. She reports that she drinks alcohol. She reports that she does not use illicit drugs.  REVIEW OF SYSTEMS:   Not obtainable.   SUBJECTIVE:  Comatose   VITAL SIGNS: There were no vitals taken for this visit.    PHYSICAL EXAMINATION: See prior px exam Pt preterminal  Recent Labs Lab 04/23/14 0205 04/25/14 0226 04/26/14 0350  NA 144 144 146  K 4.1 5.1 4.9  CL 104 100 103  CO2 28 31 32  BUN 13 23 40*  CREATININE 0.85 0.85 0.94  GLUCOSE 117* 152* 130*    Recent Labs Lab 04/22/14 1600 04/23/14 0205 04/26/14 0350  HGB 9.3* 8.8* 10.2*  HCT 32.8* 30.8* 36.9  WBC 9.6 8.1 8.6  PLT 166 166 155   No results found.  ASSESSMENT / PLAN:  End stage  copd Comfort care status  Principal Problem:   Comfort measures only status Active Problems:   Gold stage D. COPD with emphysematous and asthmatic bronchitic component   DNR (do not resuscitate)   Dorcas Carrow Pulmonary and Critical Care Medicine [facility] Pager: [phone]  04/27/2014, 4:35 PM

## 2014-04-28 NOTE — Progress Notes (Addendum)
Received request from [facility] for GIP evaluation; pt chart reviewed and pt seen at bedside with Dr [facility] staff physician- who completed face to face required by Medicare regulations as pt would be entering into 3rd Hospice Medicare Benefit period.  Pt is a DNR code status. Hospice eligibility confirmed, [facility] dx: COPD (J 44.99). CMRN Heather aware; Clinical research associate informed pt's sister Lars Masson to be at the hospital after 3 pm. Forrestine Him CSW to follow up with sister to complete registration paperwork. Valente David RN MSN [facility] Liaison [phone]

## 2014-04-28 NOTE — Care Management Note (Addendum)
05/02/2014 Margie with [facility] aware of referral. Ronny Flurry RN BSN     05/18/2014 Referral for GIP , from DR [name]  ( to stay attending ) . Spoke with patient's sister Lars Masson [phone] , explained GIP . Ms Denny Peon in agreement and would like to use [facility] [place] . Referral called to [facility] , awaiting call back.   Ronny Flurry RN BSN

## 2014-04-28 NOTE — Progress Notes (Signed)
Referral for GIP admission made through RNCM/Dr. Joya Gaskins order.  He will remain attending.  Met with [name] at bedside. Conversations around his life with Thomasene, how they met and the recent Thanksgiving they enjoyed together.  He is grieving appropriately.  Offered emotional support and listening.  Patient is quiet and unresponsive at this time, with symptom management via MS infusion and bolus as needed.  Comfort measures for EOL in place.  Will follow as needed  Kizzie Fantasia, RN, MSN, [facility] Palliative Integration

## 2014-04-28 NOTE — Progress Notes (Signed)
Wasted 125 cc Morphine IV in sink. Witnessed by Patrecia Pace, RN

## 2014-04-29 DIAGNOSIS — Z515 Encounter for palliative care: Secondary | ICD-10-CM

## 2014-04-29 DIAGNOSIS — J438 Other emphysema: Secondary | ICD-10-CM

## 2014-04-29 MED ORDER — SODIUM CHLORIDE 0.9 % IV SOLN
1.0000 mg/h | INTRAVENOUS | Status: DC
  Filled 2014-04-29: qty 10

## 2014-04-29 MED ORDER — ATROPINE SULFATE 1 % OP SOLN: 2.0000 [drp] | OPHTHALMIC | Status: DC | PRN

## 2014-05-01 ENCOUNTER — Encounter: Payer: Medicare Other | Admitting: Gynecology

## 2014-05-01 NOTE — Discharge Summary (Signed)
  Physician Discharge Summary      DEATH SUMMARY Patient ID: [name] MRN: [id number] DOB/AGE: 64/07/1950 64 y.o.  Admit date: 04/22/2014 Discharge date: 15-May-2014  Discharge DEATH Diagnoses:  Principal Problem:   Comfort measures only status Active Problems:   Gold stage D. 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 Now on morphine drip  Pt adm for abd pain and copd exac now full dnr dni and comfort care status  Discharge Exam: BP 140/82 mmHg  Pulse 129  Temp(Src) 97.9 F (36.6 C) (Axillary)  Resp 18  Ht 5\' 1"  (1.549 m)  Wt 121 lb 3.2 oz (54.976 kg)  BMI 22.91 kg/m2  SpO2 99%  deceased  Labs at discharge Lab Results  Component Value Date   CREATININE 0.94 04/26/2014   BUN 40* 04/26/2014   NA 146 04/26/2014   K 4.9 04/26/2014   CL 103 04/26/2014   CO2 32 04/26/2014   Lab Results  Component Value Date   WBC 8.6 04/26/2014   HGB 10.2* 04/26/2014   HCT 36.9 04/26/2014   MCV 83.7 04/26/2014   PLT 155 04/26/2014   Lab Results  Component Value Date   ALT 13 04/22/2014   AST 15 04/22/2014   ALKPHOS 63 04/22/2014   BILITOT <0.2* 04/22/2014   Lab Results  Component Value Date   INR 1.16 03/28/2013   INR 1.08 12/01/2011   INR 1.4 07/05/2008    Current radiology studies No results found.  Disposition:  20-Expired    Discharged Condition: expired     Signed: 09/02/2008 05/01/2014, 5:01 PM

## 2014-05-03 ENCOUNTER — Other Ambulatory Visit: Payer: Self-pay | Admitting: *Deleted

## 2014-05-26 NOTE — Progress Notes (Signed)
GIP visit [facility] 6N19 Dyneshia Viscuso- [facility] Bailiff RN  GIP visit hospice diagnosis of COPD. Pt is a DNR code status. Patient minimally responsive, does not respond to writers voice. Patient has morphine drip being adjusted for patients comfort. At end of visit boyfriend arrived at bedside. No acute distress noted patient is tachypneic. Spoke with Annabelle Harman RN who states that patients status has not changed at all since last night.  Please contact [facility] at [phone] with hospice needs and at time of death Thanks Ulis Rias RN

## 2014-05-26 NOTE — Progress Notes (Signed)
Subjective: Comfortable today on medication.  Not responsive, no increased wob.  Objective: Vital signs in last 24 hours: Blood pressure 179/82, pulse 150, temperature 97 F (36.1 C), temperature source Axillary, resp. rate 20, SpO2 89 %.  Intake/Output from previous day: 12/04 0701 - 12/05 0700 In: -  Out: 700 [Urine:700]   Physical Exam:   comfortable, unresponsive.     Lab Results: No results for input(s): WBC, HGB, HCT, PLT in the last 72 hours. BMET No results for input(s): NA, K, CL, CO2, GLUCOSE, BUN, CREATININE, CALCIUM in the last 72 hours.  Studies/Results: No results found.  Assessment/Plan:  1) Endstage copd with chronic respiratory failure.  Comfort care only, and no heroic measures  -continue meds for comfort.    Barbaraann Share, M.D. 05/08/2014, 1:52 PM

## 2014-05-26 NOTE — Progress Notes (Signed)
   RN calling elink  - patient dyspneic and uncomfortable on 3mg /h morphine gtt. REquesting orders be modified to allow RN directed titration to comfort  Plan - orders adjusted to allow for RN directed titration  Dr. , M.D., [facility].C.P Pulmonary and Critical Care Medicine Staff Physician [facility] and Critical [facility] Pager: [phone], If no answer or between  15:00h - 7:00h: call [phone]  05/18/2014 1:06 AM

## 2014-05-26 NOTE — Progress Notes (Signed)
[facility]: Social Work Note (late entry from 05/09/2014 1545)  Met with patient's sister/HCPOA Audrena at bedside to explain service and confirm desire to proceed. Sherrye Payor is familiar with hospice from patient's previous admission. Sherrye Payor is comforted by note left in room from Dr. Joya Gaskins. She shares Dr. Joya Gaskins has always been patient's favorite MD. She is pleased Dr. Joya Gaskins will remain attending. She states "I know it pleases her too, to know Dr. Joya Gaskins is caring for her at this time." [place] completed paper which was delivered to admitting. Sherrye Payor declined [facility] chaplain support due to she has already contacted multiple pastors patient is familiar with and asked them to visit and pray over her. Audrena expressed strong faith and spiritual beliefs. She is very appreciative of care patient is receiving on 6N and the option to have [facility] support.    Sherrye Payor wishes to make it clear she is HCPOA and should be contacted for decisions and changes.   Sherrye Payor is aware [facility] staff collaborate daily with hospital staff and attending. She is aware [facility] w/e staff will see patient over weekend. Hope to follow up Monday if patient is still here.   Please contact [facility] at [phone] with hospice needs and at time of death.   Thank you.  Erling Conte, [place]

## 2014-05-26 DEATH — deceased

## 2014-06-06 ENCOUNTER — Ambulatory Visit: Payer: Medicare Other | Admitting: Adult Health

## 2014-07-26 ENCOUNTER — Telehealth: Payer: Self-pay | Admitting: Critical Care Medicine

## 2014-07-26 NOTE — Telephone Encounter (Signed)
Spoke with Marisue Ivan at [facility], states that a form was faxed to our office yesterday from November to bill a medication for generic Duoneb and nebulizer.  We faxed back saying that pt is deceased, but they are needing the form still filled out for billing purposes.    Marisue Ivan is faxing form back to office to Crystal's attn- this needs to be filled out so they can bill for meds given while patient was still receiving care.  Forwarding to Crystal to look out for.

## 2014-07-27 NOTE — Telephone Encounter (Signed)
Crystal, did you receive this form?

## 2014-07-27 NOTE — Telephone Encounter (Signed)
Crystal form and copy of this phone note in Dr [name] green folder for signature .Kandice Hams

## 2014-07-31 NOTE — Telephone Encounter (Signed)
Folder given to [employer].

## 2014-07-31 NOTE — Telephone Encounter (Signed)
Signed order has been faxed to [facility] .Kandice Hams

## 2014-11-02 IMAGING — CT CT ABD-PELV W/ CM
2 of 5 series · 14 of 46 positions shown, 16 images · IV contrast (APPLIED)
Comparison: [REDACTED] CT ABDOMEN AND PELVIS WITH
CONTRAST from 03/02/2012.

CLINICAL DATA: Diffuse abdominal pain. Nausea and vomiting. History
of Crohn disease.

EXAM:
CT ABDOMEN AND PELVIS WITH CONTRAST
TECHNIQUE: Multidetector CT imaging of the abdomen and pelvis was performed
using the standard protocol following bolus administration of
intravenous contrast.
CONTRAST:  100mL OMNIPAQUE IOHEXOL 300 MG/ML  SOLN

[Series 2: abd/ pelvis 5.0 i30f 1 · axial · 0.68mm/px · z∈[+687,+1022]mm · 11 of 75 slices shown, 13 images]
[im 4/75  soft-tissue]
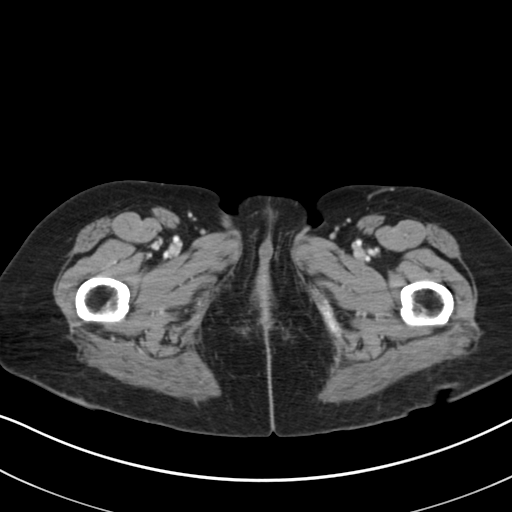
[im 4/75  bone]
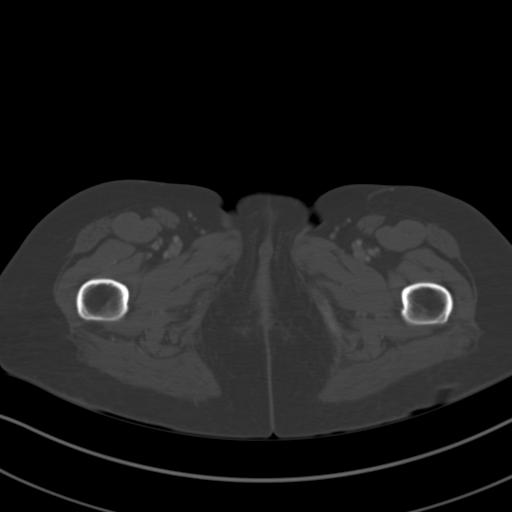
[im 12/75  soft-tissue]
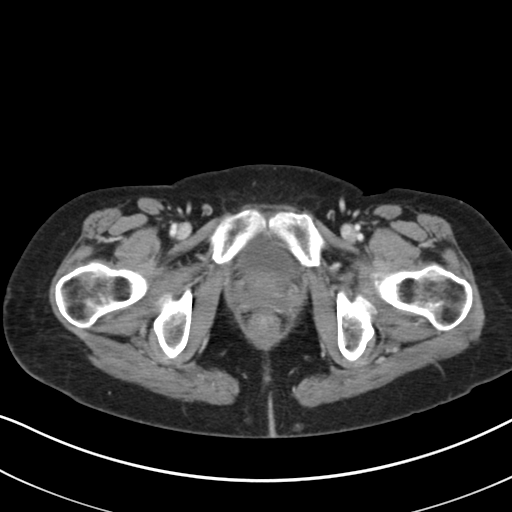
[im 20/75  soft-tissue]
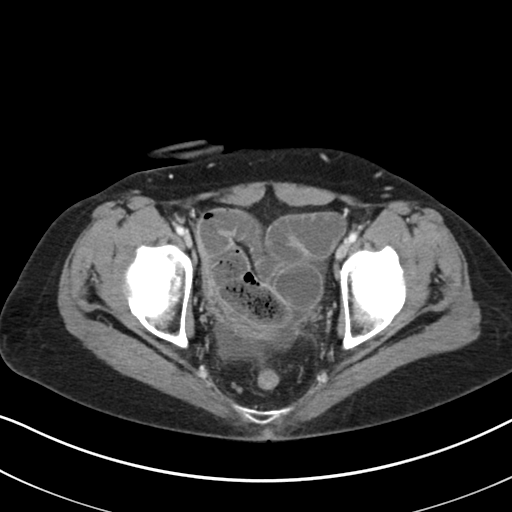
[im 24/75  soft-tissue]
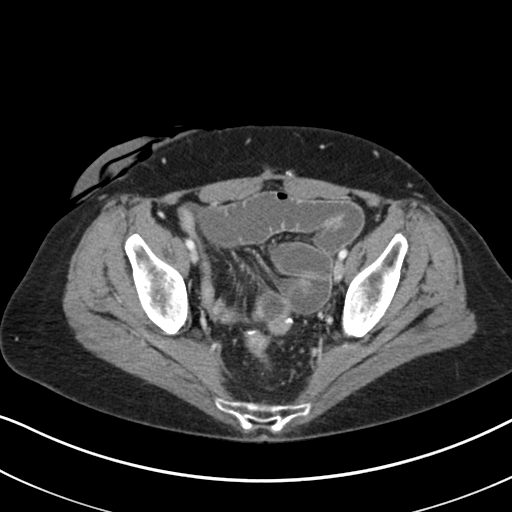
[im 32/75  soft-tissue]
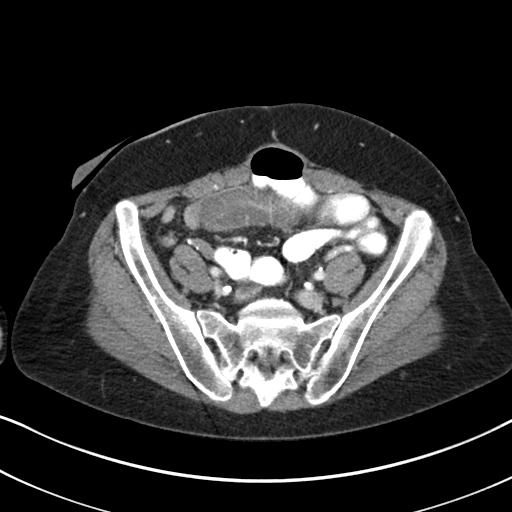
[im 39/75  soft-tissue]
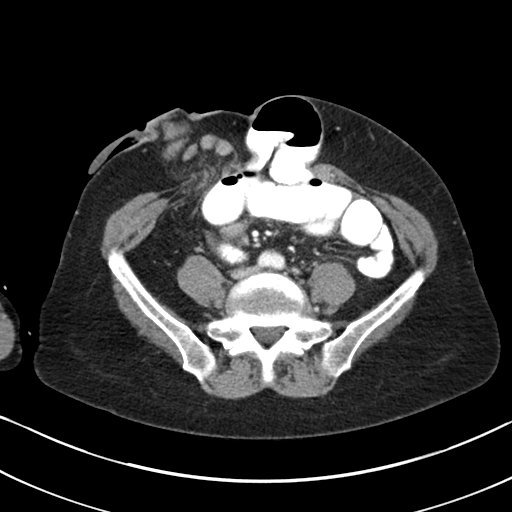
[im 43/75  soft-tissue]
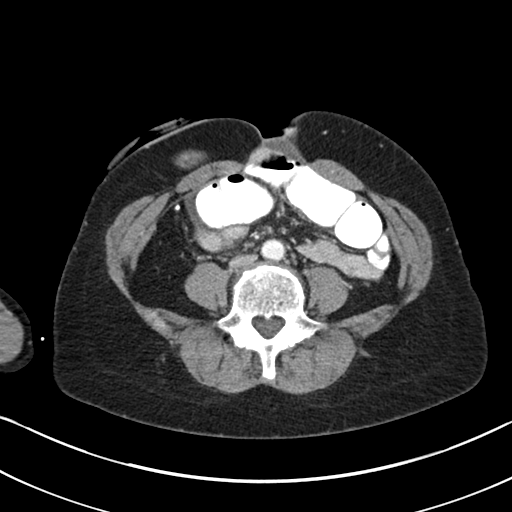
[im 51/75  soft-tissue]
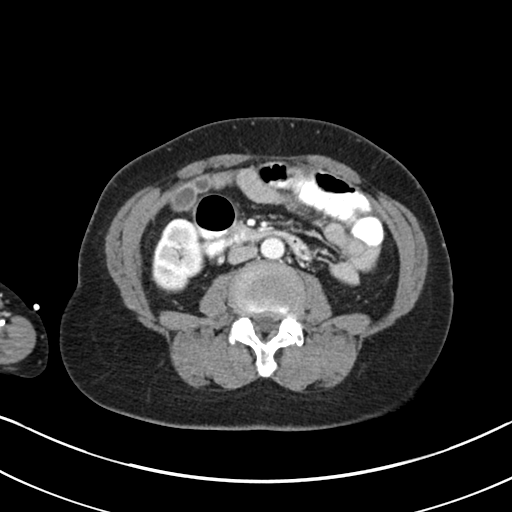
[im 55/75  soft-tissue]
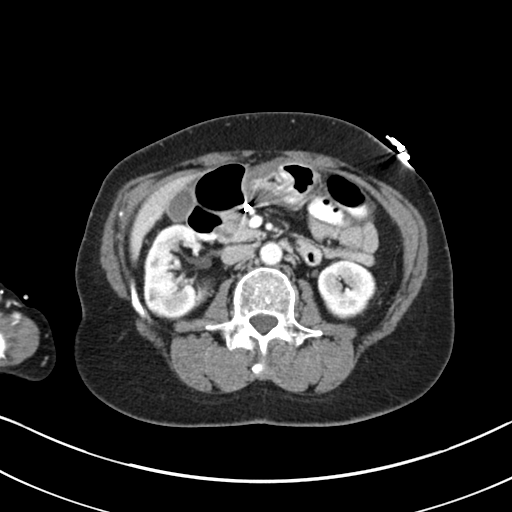
[im 55/75  bone]
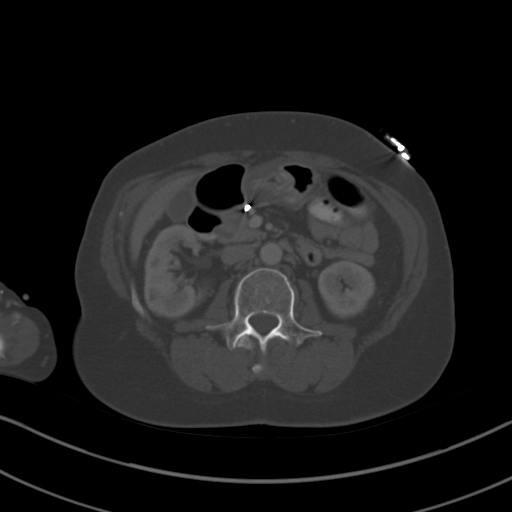
[im 63/75  soft-tissue]
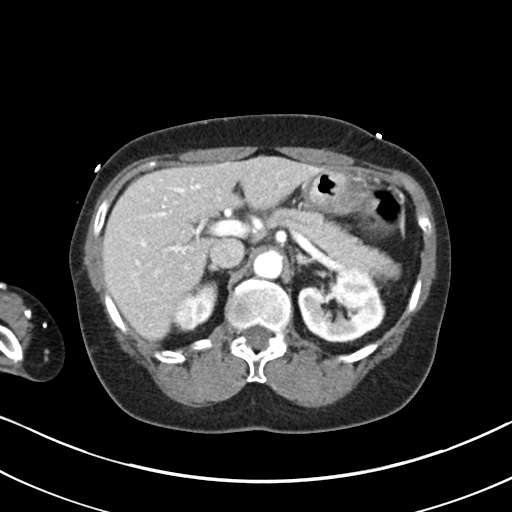
[im 71/75  soft-tissue]
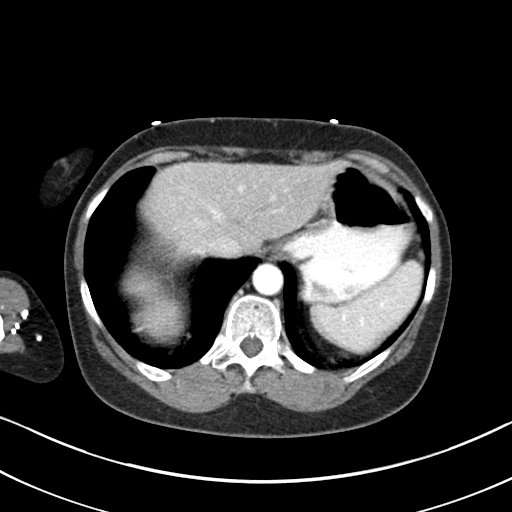

[Series 4: cor · coronal · 0.62mm/px · 3 of 116 slices shown]
[im 39/116  soft-tissue]
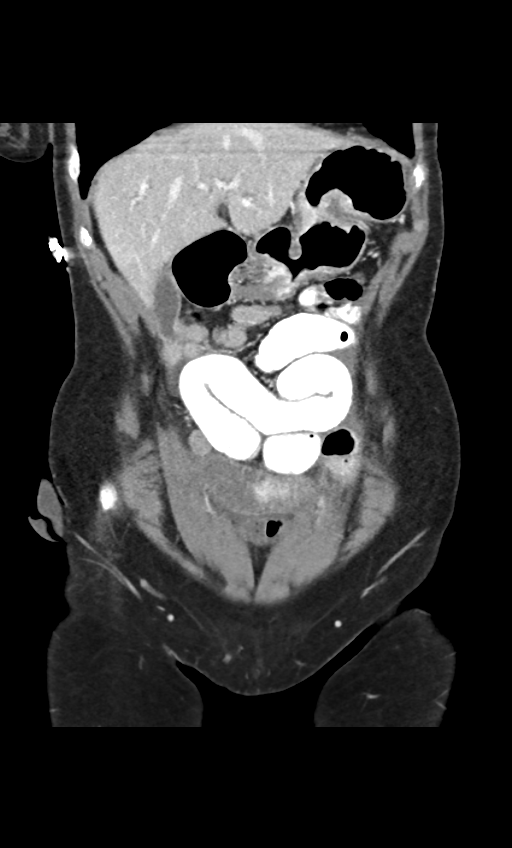
[im 52/116  soft-tissue]
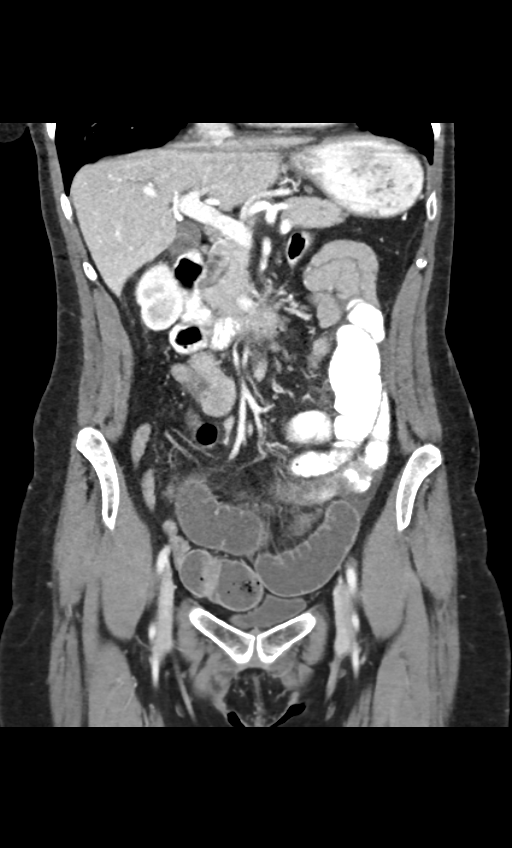
[im 64/116  soft-tissue]
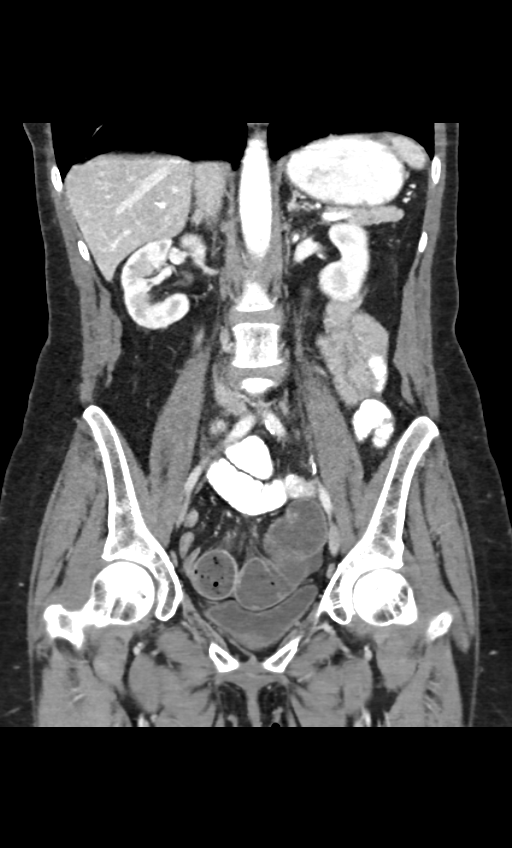

[14 of 46 positions shown; findings below may reference images not displayed]

FINDINGS: No focal abnormality is seen in the liver or spleen. The stomach,
duodenum, pancreas, gallbladder, and adrenal glands are
unremarkable. No evidence for hydronephrosis or renal mass in either
kidney. Tiny low-density cortical lesions in the kidneys bilaterally
are too small to characterize but are stable and likely represent
tiny cysts.

No abdominal aortic aneurysm. There is no free fluid or
lymphadenopathy in the abdomen.

As before, the patient has a paraumbilical hernia containing a loop
of small bowel. Right lower quadrant end ileostomy with peristomal
small bowel herniation is again noted.

The patient has dilated small bowel loops tracking down in the
central anatomic pelvis were there is mesenteric edema and interloop
mesenteric fluid. Small bowel is dilated up to about 2.5 cm in
diameter and can be followed to a segment the displays a small bowel
feces sign immediately proximal to an abrupt transition zone. The
abrupt transition zone is immediately anterior to the staple line of
the Hartmann's pouch. The etiology for the obstruction at this level
is most likely secondary to an adhesion. There is no overt small
bowel wall thickening or gross hyper enhancement. Ileum distal to
the transition zone is completely decompressed.

Bone windows reveal no worrisome lytic or sclerotic osseous lesions.
IMPRESSION: Distal small bowel obstruction with an abrupt transition zone in the
mid anatomic pelvis, immediately anterior to the suture line at the
blind end of the Hartmann's pouch. There is some associated
perienteric edema and interloop mesenteric fluid. Imaging features
are likely secondary to the presence of an adhesion.

I called these results to Dr. Matip at approximately 0774 hours
on 03/14/2013.

## 2014-11-03 IMAGING — CR DG CHEST 1V PORT
1 series · 1 of 1 positions shown · non-contrast
Comparison: Chest radiographs 12/01/2011. CT chest 12/01/2011 and
abdomen 03/14/2013.

CLINICAL DATA: Pre-op baseline. Assess airspace disease. History of
diabetes and Crohn's disease.

EXAM:
PORTABLE CHEST - 1 VIEW

[AP]
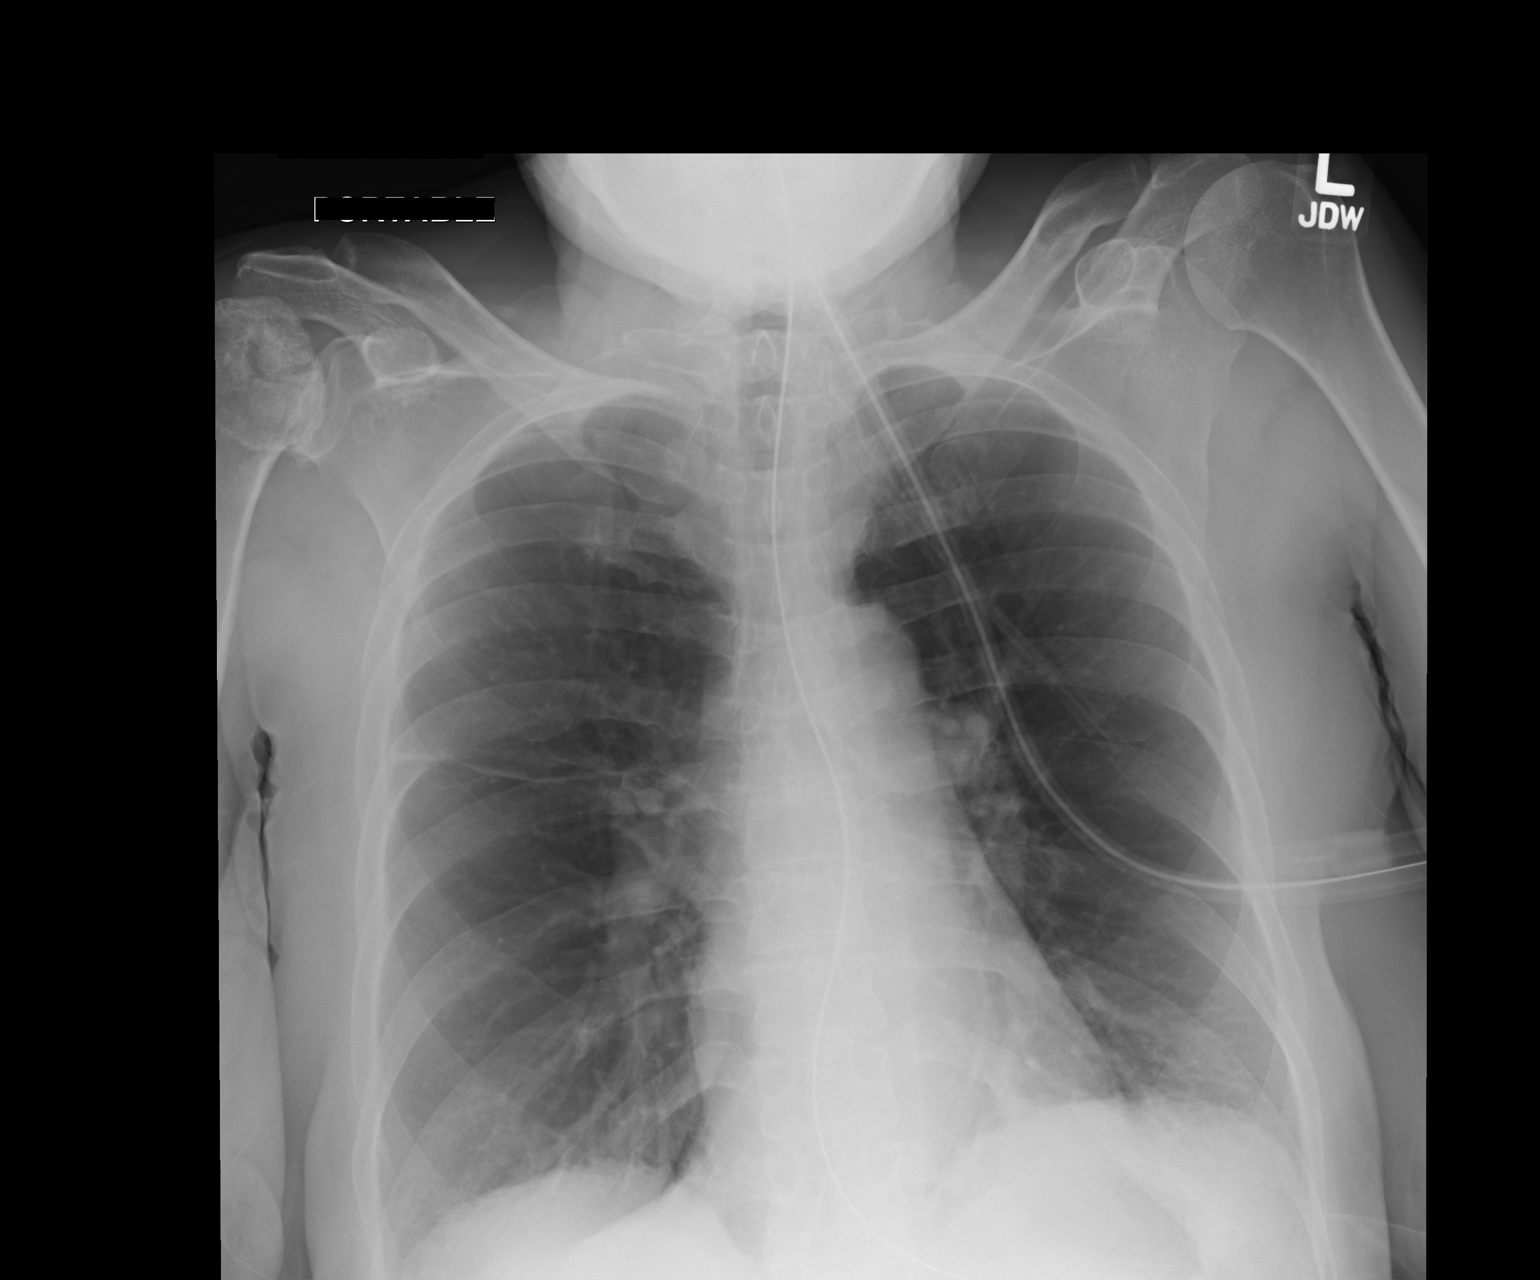

[1 of 1 positions shown; findings below may reference images not displayed]

FINDINGS: 7911 hr. Nasogastric tube projects below the diaphragm. There is
mild atelectasis at both lung bases. No confluent airspace opacity
or pleural effusion is seen. There is linear right perihilar
scarring which is unchanged. The heart size and mediastinal contours
are stable. There is fragmentation and collapse of the right humeral
head consistent with chronic avascular necrosis.
IMPRESSION: Mild bibasilar atelectasis. No consolidation or other significant
changes identified.

## 2014-11-03 IMAGING — CR DG ABDOMEN 2V
2 series · 2 of 2 positions shown · non-contrast
Comparison: 03/14/2013

CLINICAL DATA: Small bowel obstruction. Distended abdomen. FOLLOW
UP.

EXAM:
ABDOMEN - 2 VIEW

[w abdomen decub]
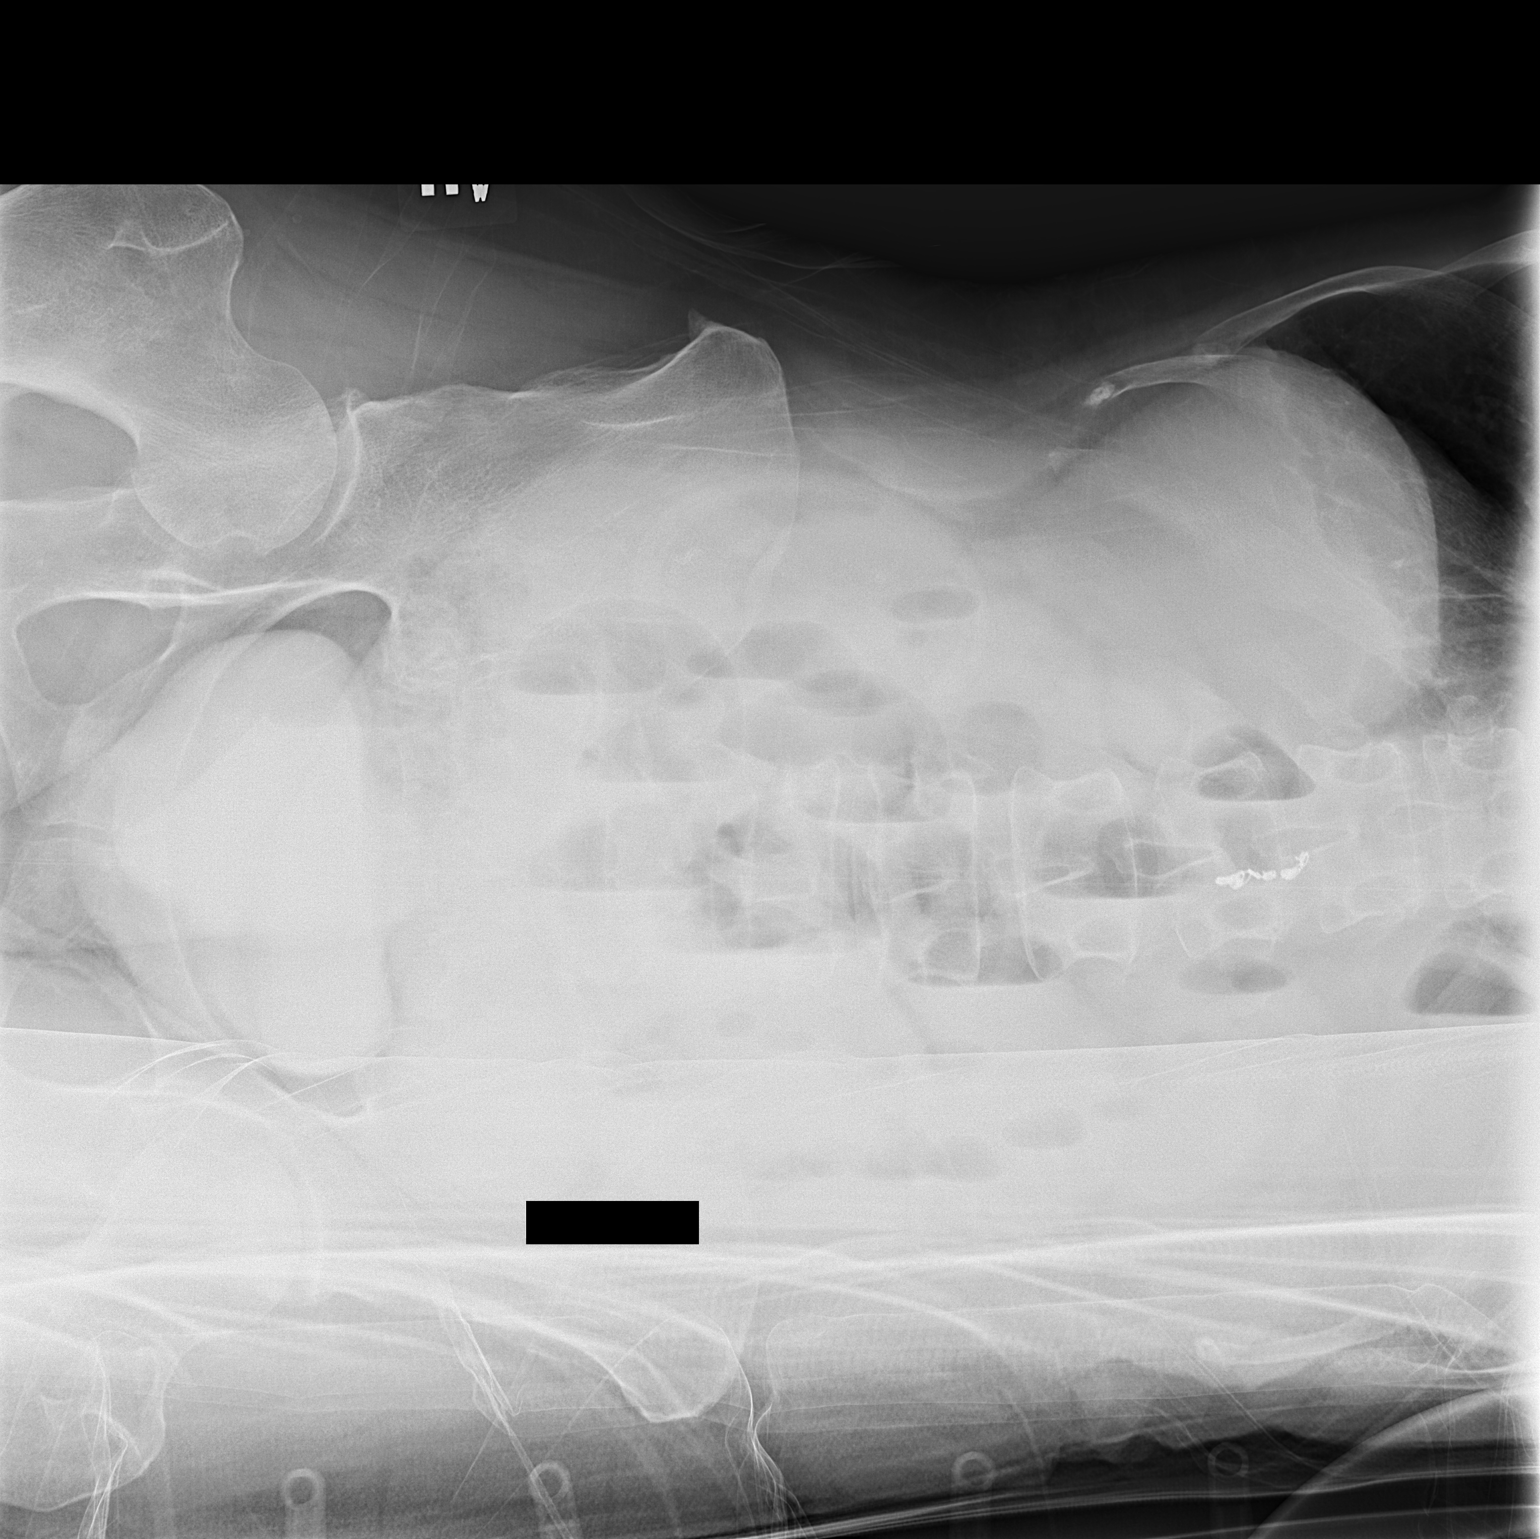

[t abdomen supine]
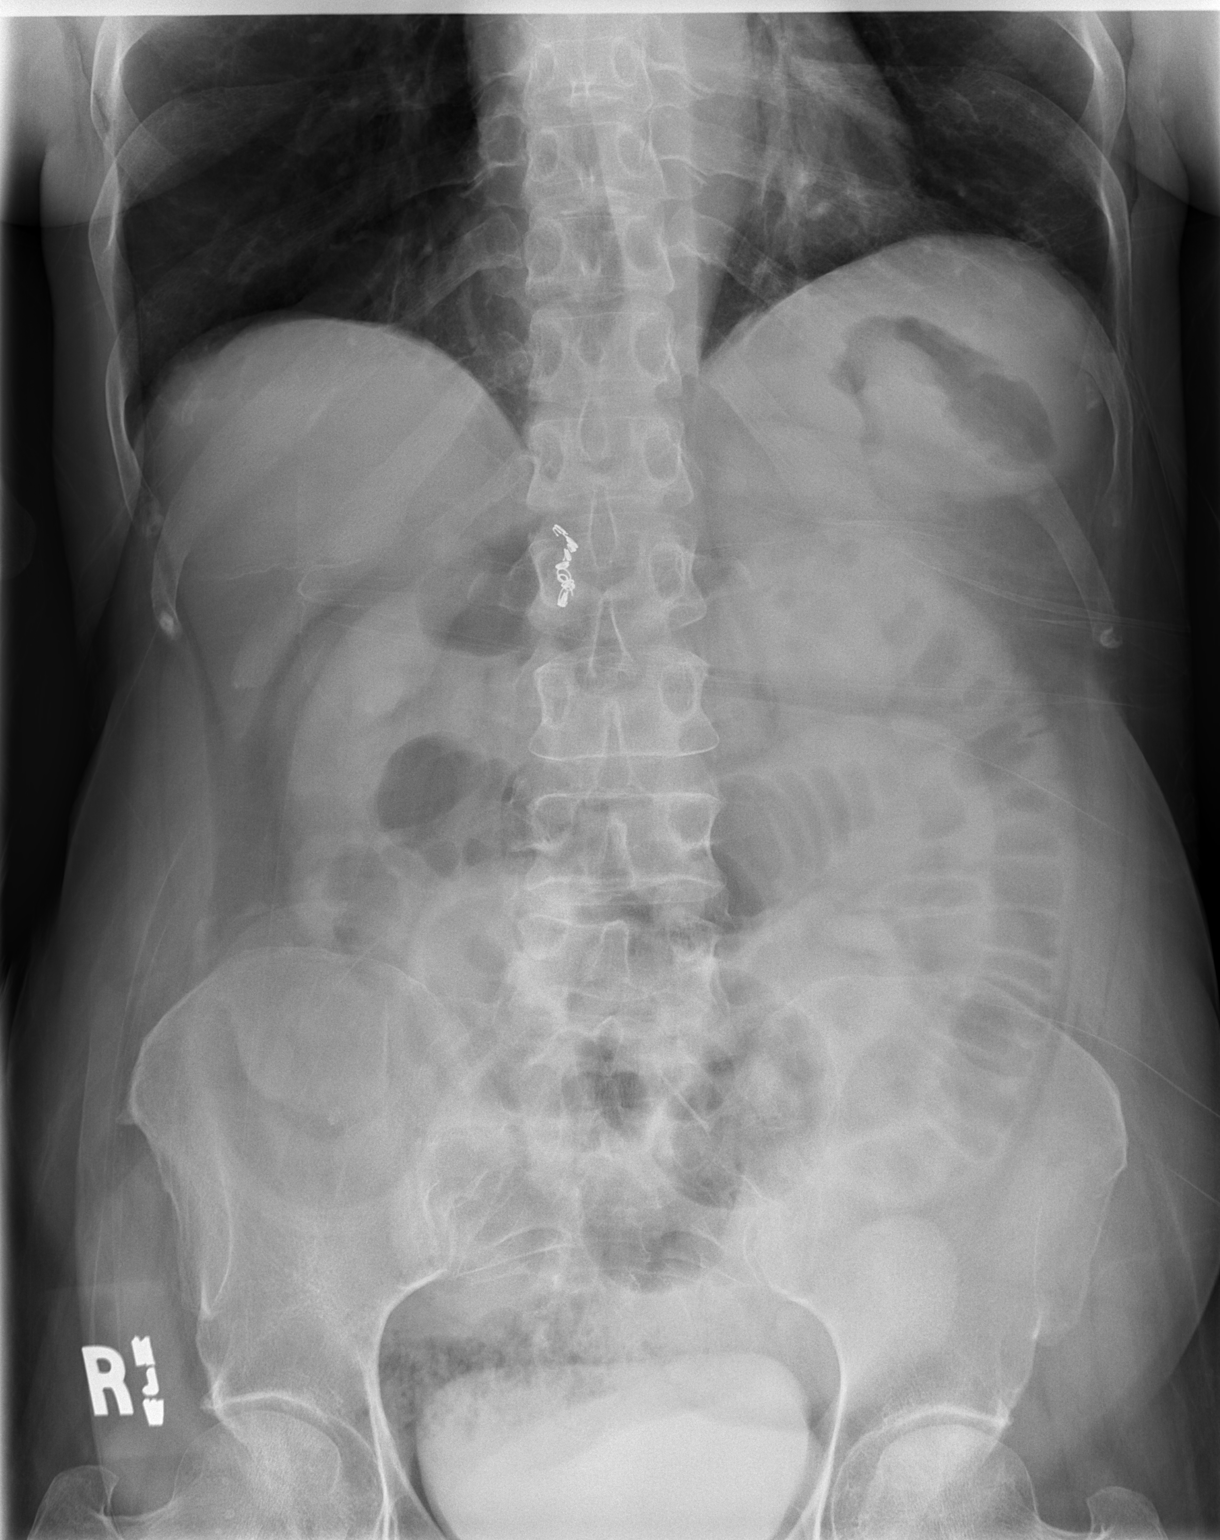

[2 of 2 positions shown; findings below may reference images not displayed]

FINDINGS: Continued small bowel obstruction pattern with dilated small bowel
loops and air-fluid levels. Suspect no real change since prior CT.
No free air or organomegaly. No suspicious calcifications. No acute
bony abnormality. Lung bases are clear.
IMPRESSION: Stable small bowel obstruction pattern.

## 2014-11-04 IMAGING — CR DG ABDOMEN 2V
2 series · 2 of 2 positions shown · non-contrast
Comparison: 03/15/2013

CLINICAL DATA: Small-bowel obstruction

EXAM:
ABDOMEN - 2 VIEW

[w abdomen decub]
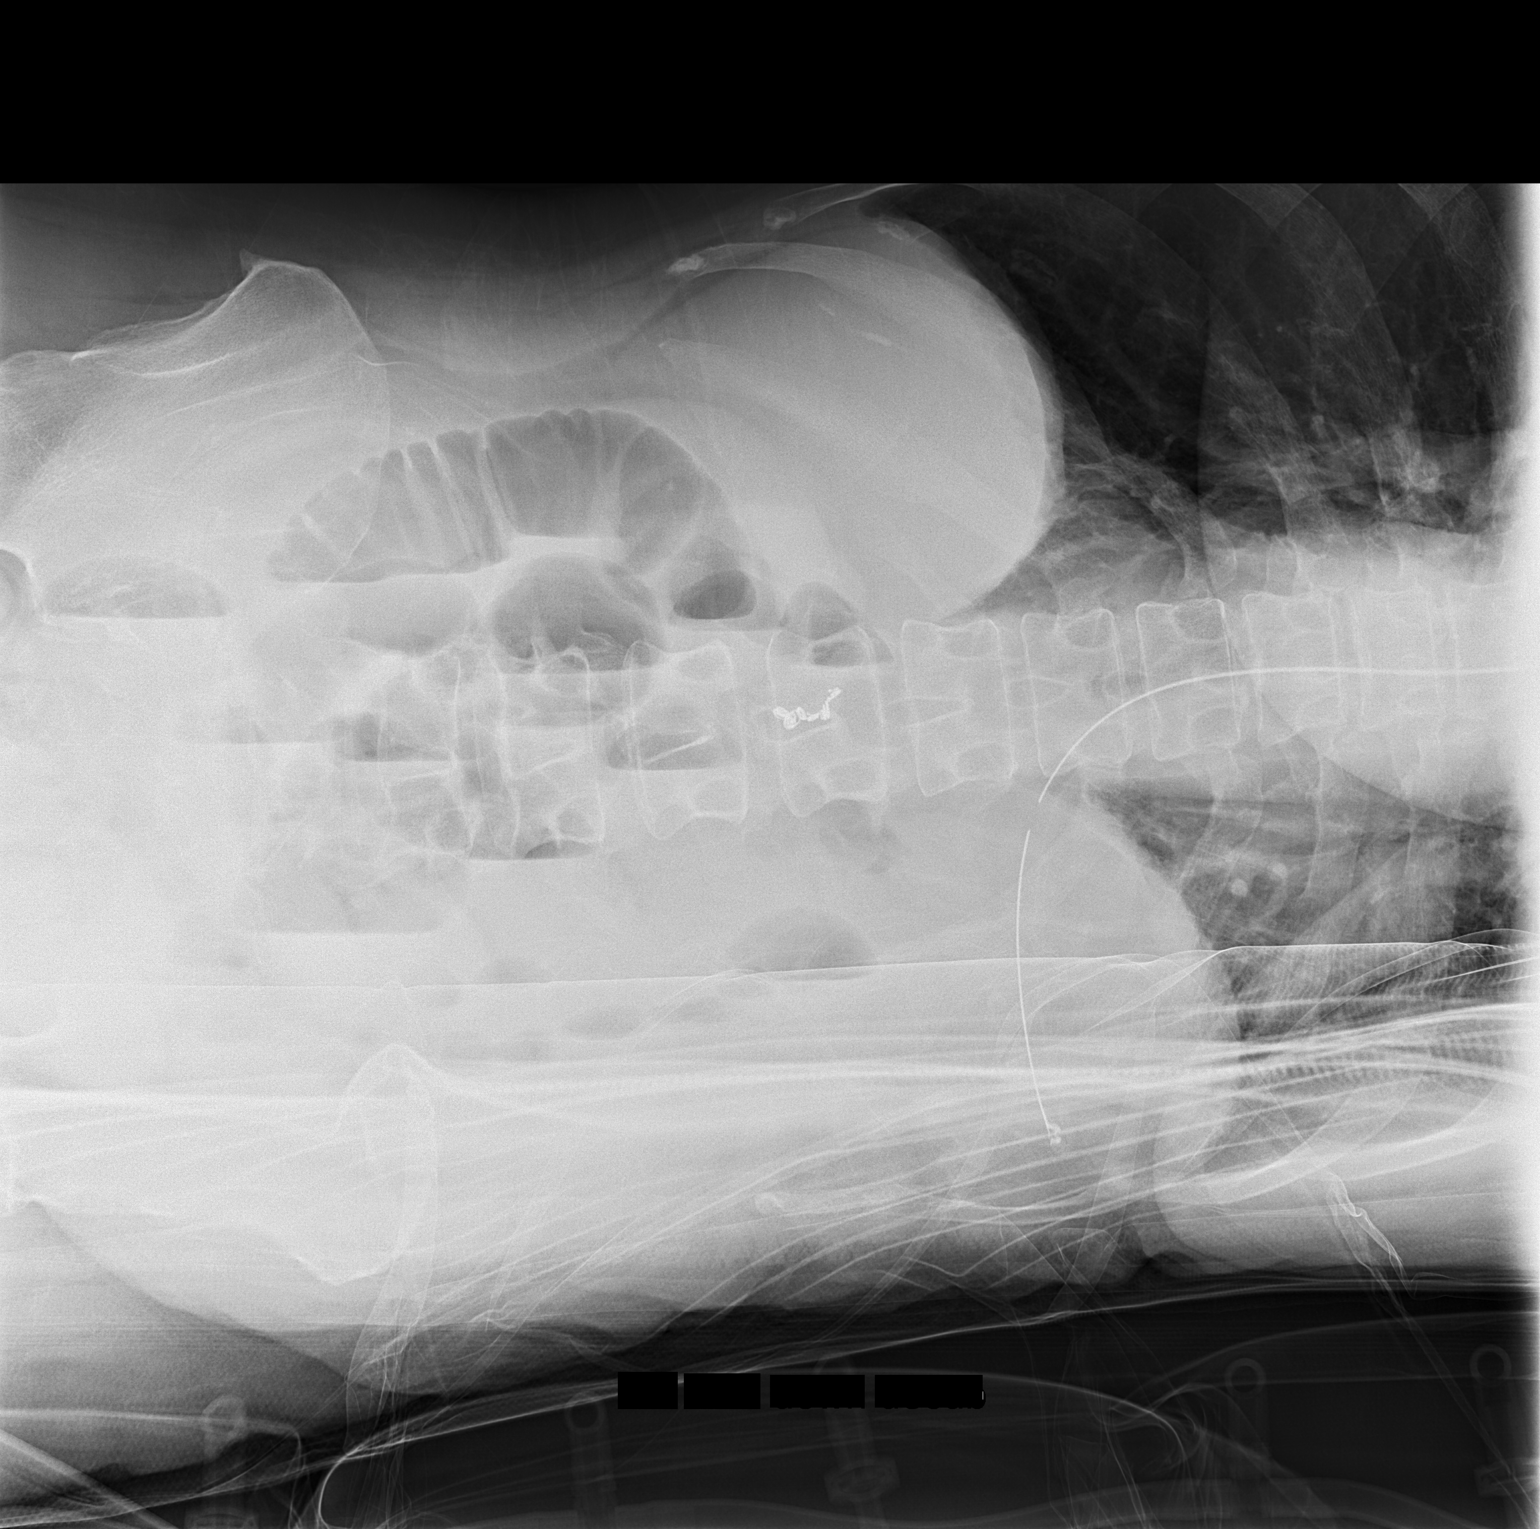

[t abdomen supine]
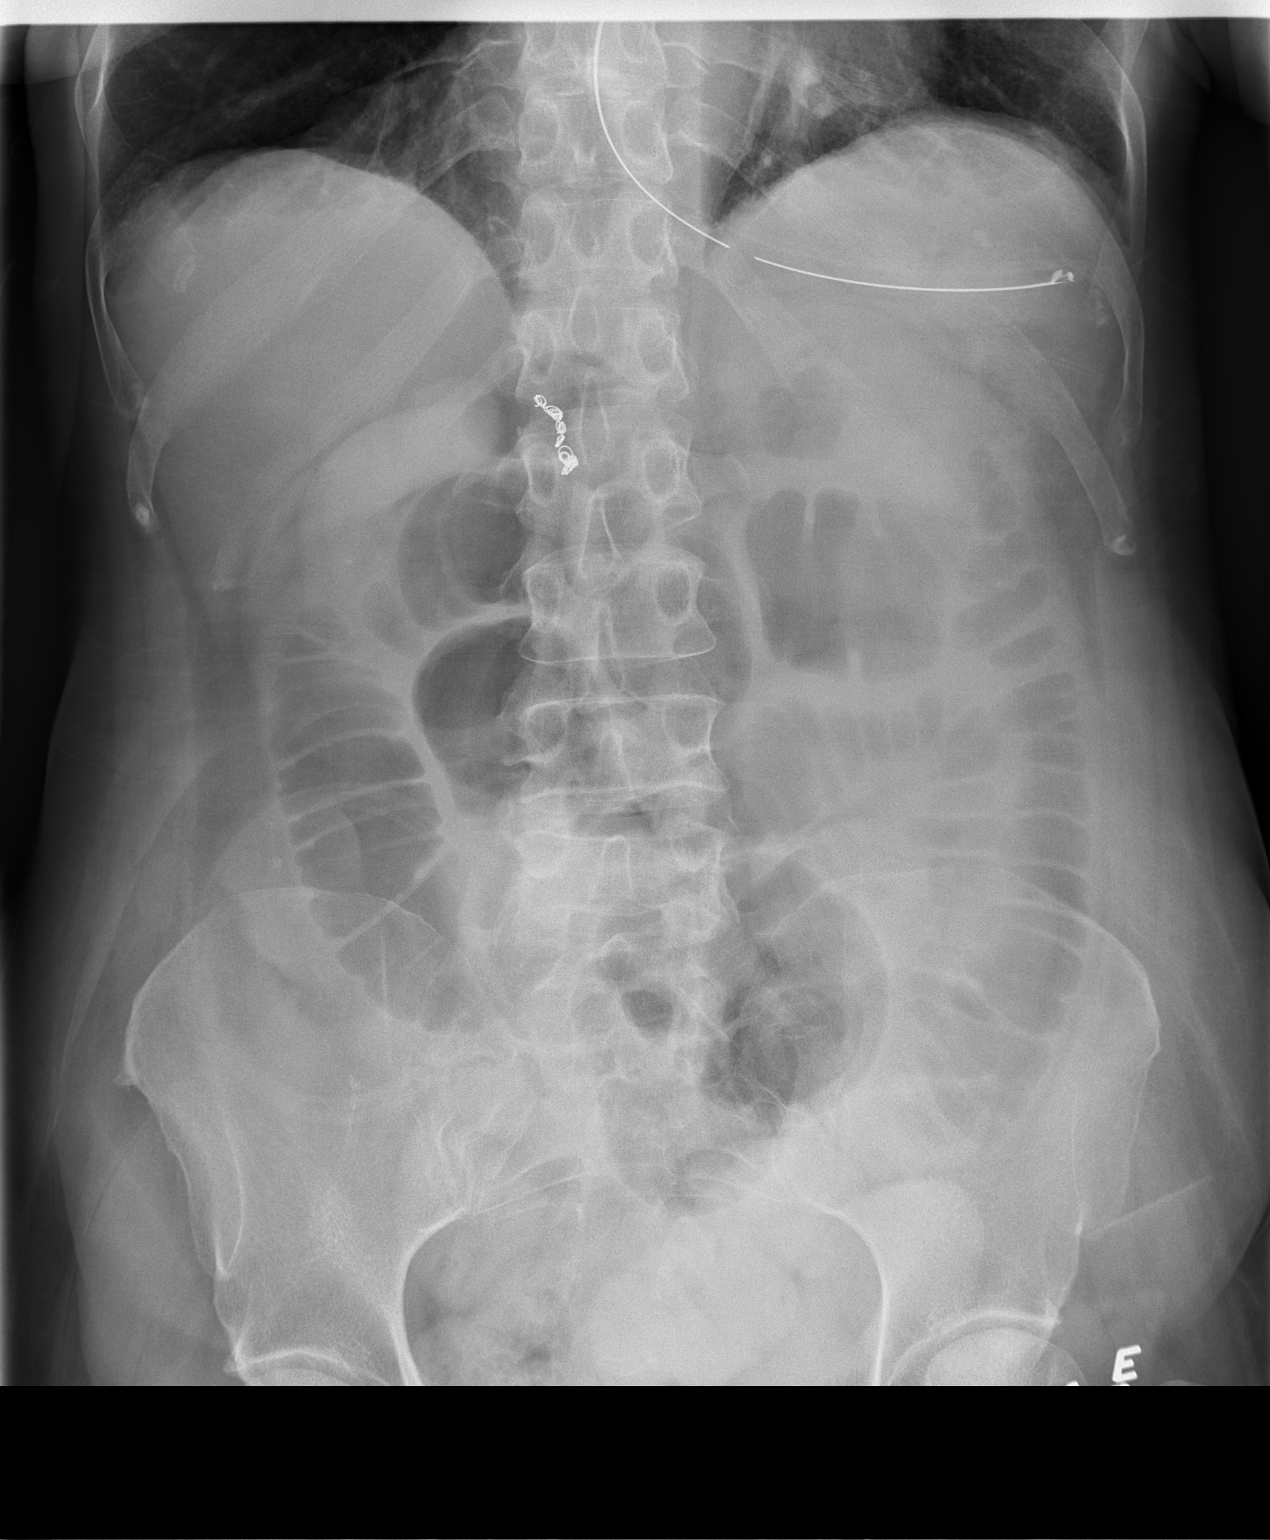

[2 of 2 positions shown; findings below may reference images not displayed]

FINDINGS: A nasogastric catheter is now noted within the stomach. There are
embolization coils identified stable from the prior exam. Multiple
dilated loops of small bowel are again identified. The overall
appearance is slightly worse than that seen on the prior exam. This
may be related in part due to more air as opposed of fluid within
the bowel. Continued followup is recommended. No free air is seen.
An ostomy is noted in the right lower quadrant.
IMPRESSION: Increase in the degree of gaseous distension although this may be
related to some changes in the fluid dynamics within the small
bowel. No free air is seen.

## 2014-11-09 IMAGING — CR DG CHEST 2V
2 series · 2 of 2 positions shown · non-contrast
Comparison: 03/15/2013

CLINICAL DATA: Postoperative fever, post exploratory laparotomy
with lysis of adhesions and mesh ventral hernia repair on 03/16/2013

EXAM:
CHEST  2 VIEW

[w chest lat]
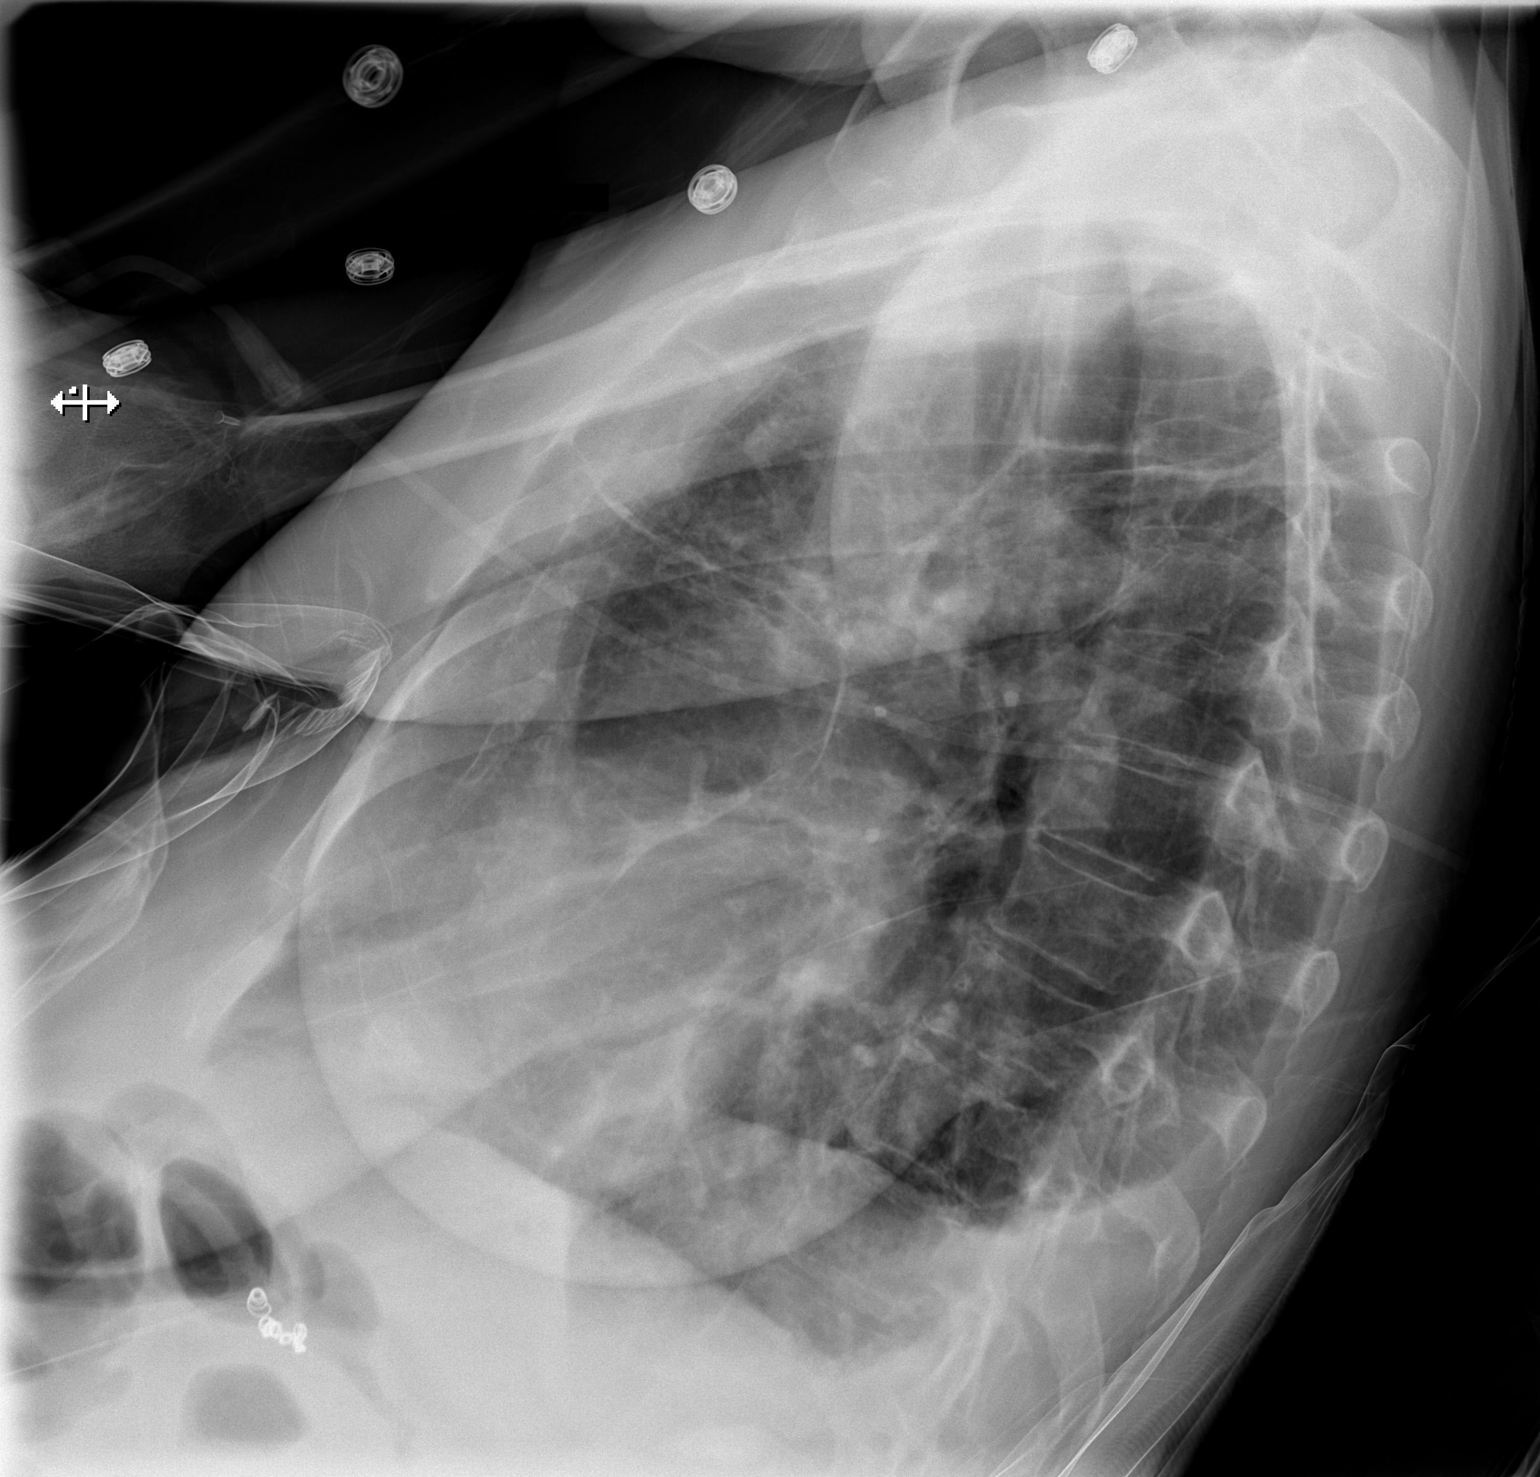

[x chest ap]
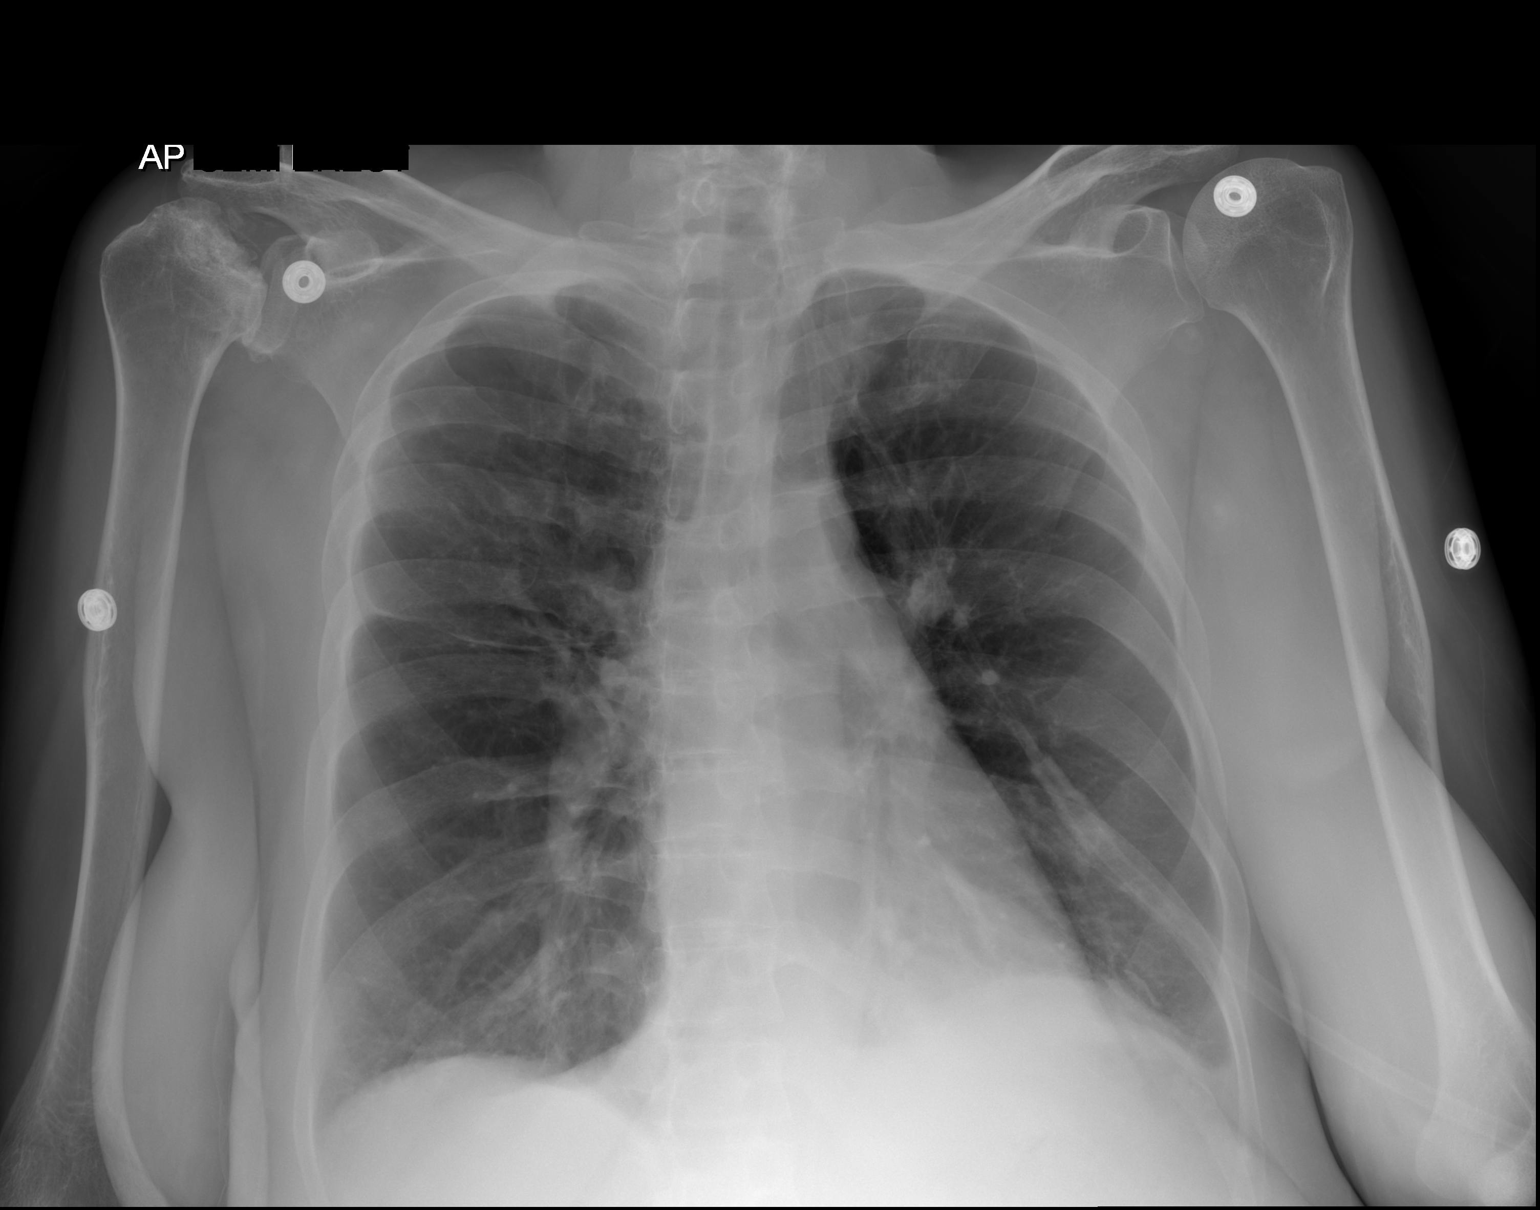

[2 of 2 positions shown; findings below may reference images not displayed]

FINDINGS: Normal heart size, mediastinal contours, and pulmonary vascularity.

Atelectasis versus consolidation in medial left lower lobe.

Lungs appear emphysematous but otherwise clear.

Tiny left pleural effusion.

No pneumothorax.

Bones unremarkable.

Interval removal of nasogastric tube.
IMPRESSION: Emphysematous changes with left lower lobe atelectasis versus
consolidation and tiny left pleural effusion.

## 2014-11-15 IMAGING — CT CT ABD-PELV W/ CM
2 of 5 series · 15 of 46 positions shown, 17 images · IV contrast (APPLIED)
Comparison: Abdominal radiographs 03/16/2013. Preoperative CT
Abdomen and Pelvis 03/14/13.

CLINICAL DATA: 62-year-old female with postoperative fever
abdominal pain nausea and vomiting. Recent small bowel obstruction
and status postoperative laparotomy for adhesions on 03/16/2013.
MRSA bacteremia. Hypotension. Initial encounter.

EXAM:
CT ABDOMEN AND PELVIS WITH CONTRAST
TECHNIQUE: Multidetector CT imaging of the abdomen and pelvis was performed
using the standard protocol following bolus administration of
intravenous contrast.
CONTRAST:  100mL OMNIPAQUE IOHEXOL 300 MG/ML  SOLN

[Series 2: abd/ pelvis 5.0 i30f 1 · axial · 0.61mm/px · z∈[+1040,+1380]mm · 12 of 76 slices shown, 14 images]
[im 4/76  soft-tissue]
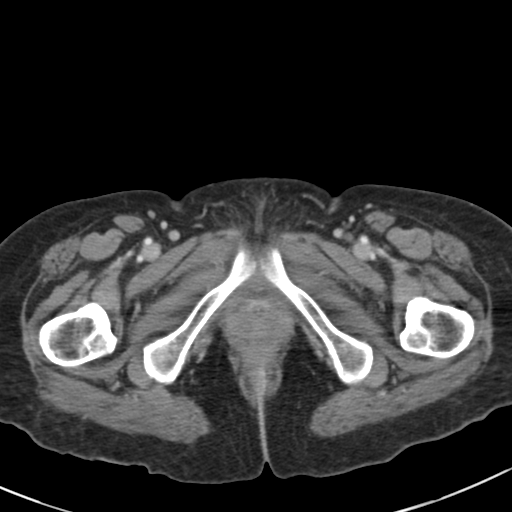
[im 4/76  bone]
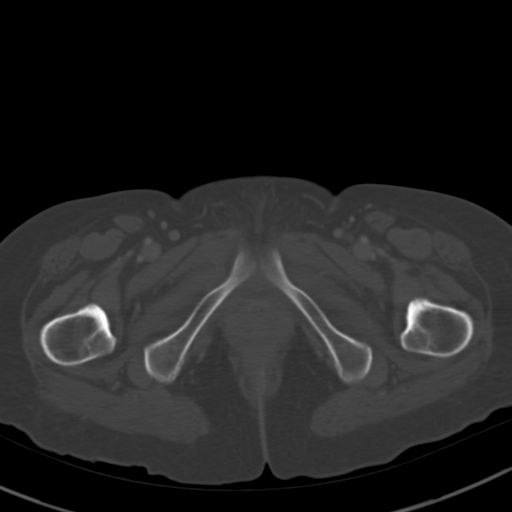
[im 12/76  soft-tissue]
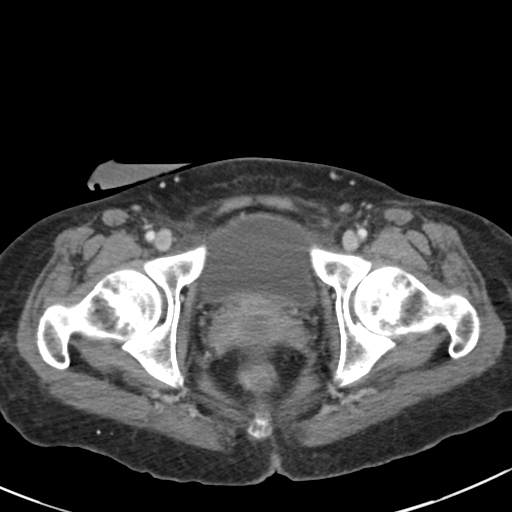
[im 16/76  soft-tissue]
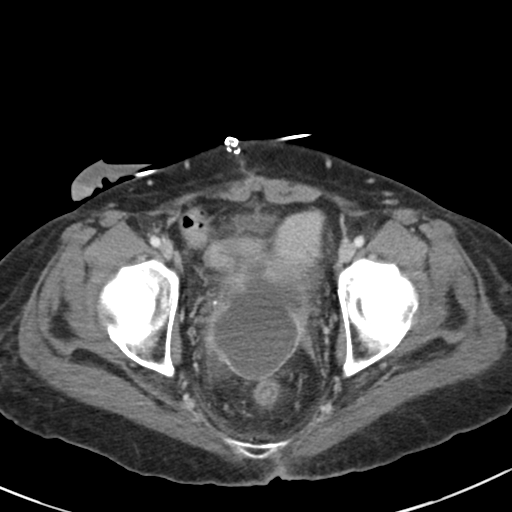
[im 23/76  soft-tissue]
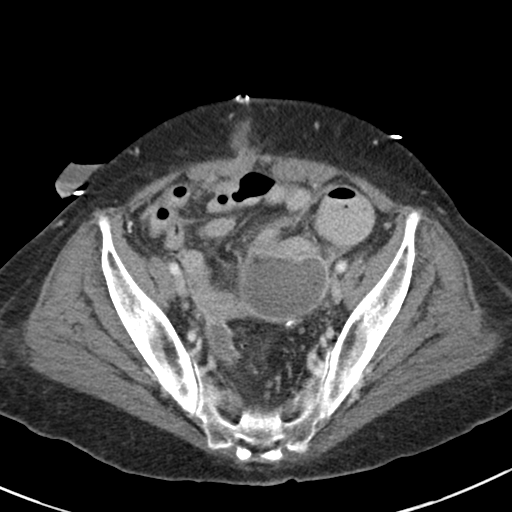
[im 31/76  soft-tissue]
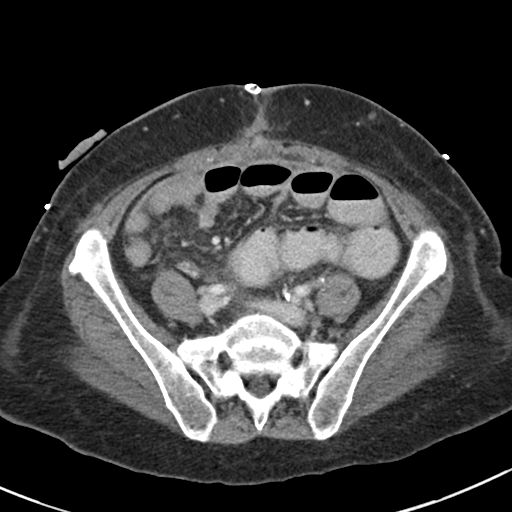
[im 34/76  soft-tissue]
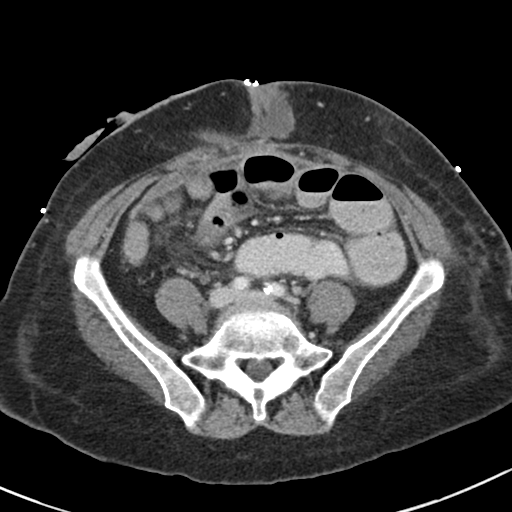
[im 42/76  soft-tissue]
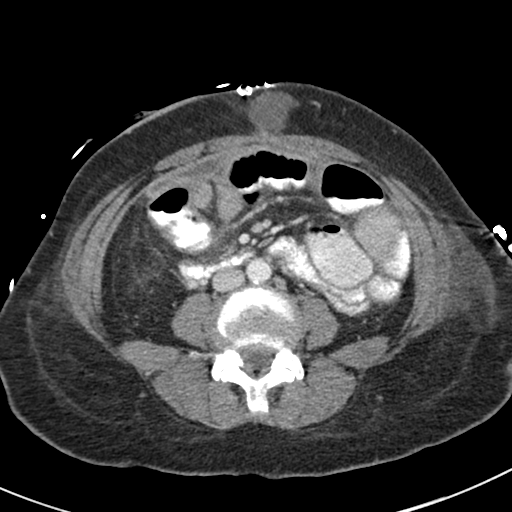
[im 46/76  soft-tissue]
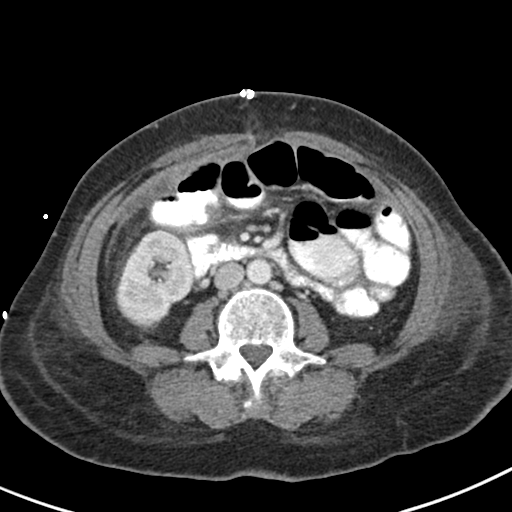
[im 53/76  soft-tissue]
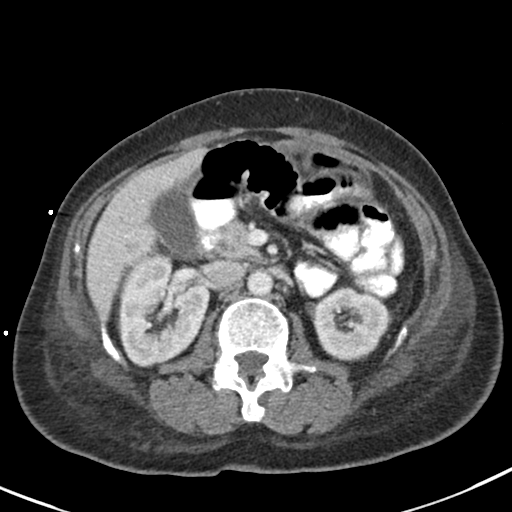
[im 53/76  bone]
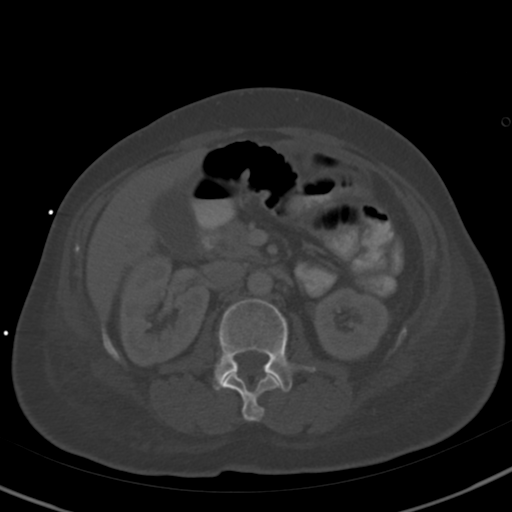
[im 61/76  soft-tissue]
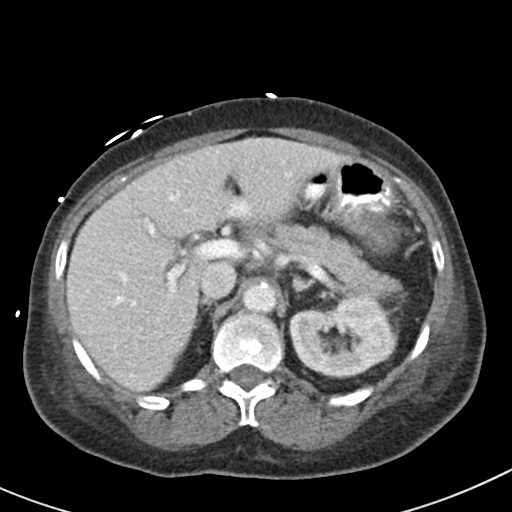
[im 64/76  soft-tissue]
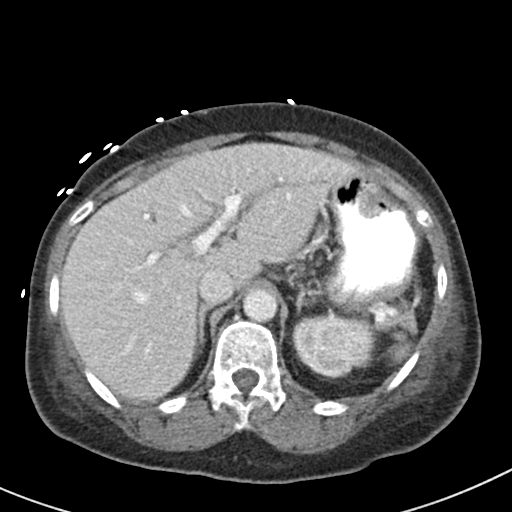
[im 72/76  soft-tissue]
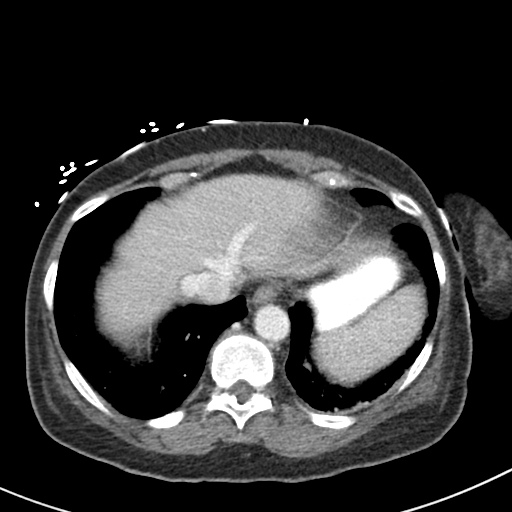

[Series 5: cor · coronal · 0.65mm/px · 3 of 148 slices shown]
[im 50/148  soft-tissue]
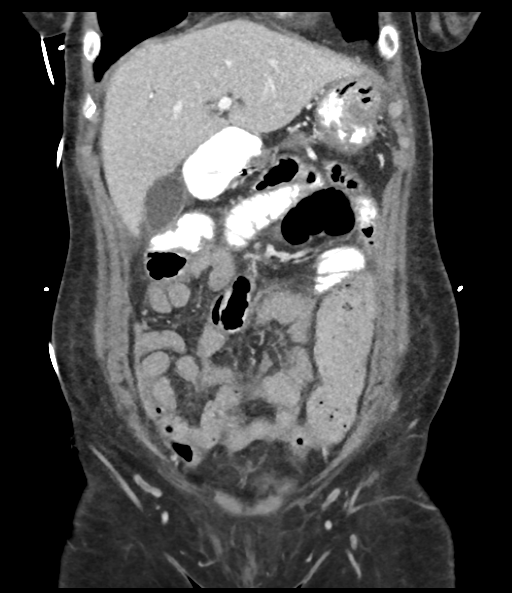
[im 66/148  soft-tissue]
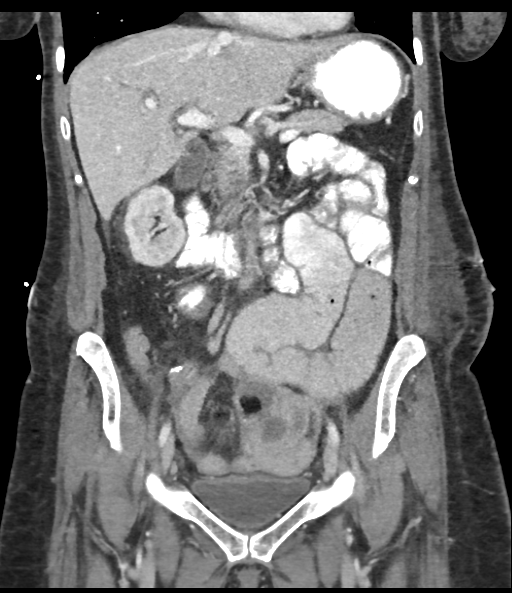
[im 82/148  soft-tissue]
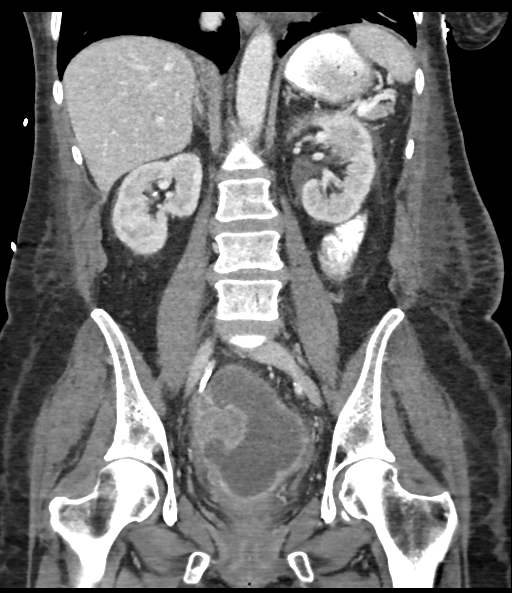

[15 of 46 positions shown; findings below may reference images not displayed]

FINDINGS: Negative lung bases. No pericardial or pleural effusion. No acute
osseous abnormality identified.

Chronic changes of subtotal colectomy. Blind-ending distal colon
segment, rectum and sigmoid, appears stable.

New rim enhancing fluid collection in the deep lower abdomen and
pelvis encompassing 8.3 x 6.0 x 10 cm. There appears to be a small
amount of mesenteric fat associated with this collection. Oral
contrast was administered, but has not significantly opacified the
distal small bowel. Postoperative changes to the ventral abdomen
with skin staples in place. More simple appearing fluid tracking
along the subcutaneous incision site (series 2, image 38 - 57). Near
to the right abdominal ostomy there is a small 17 mm rim enhancing
collection suspected (image 41). Subjacent to the right ventral
abdominal wall at the level of surgery there is a crescentic rim
enhancing collection (image 34) encompassing 5.2 x 0.9 x 2.2 cm.

No pneumoperitoneum. Mildly dilated mid small bowel loops measuring
up to 33 mm diameter. Transition seems to be in the area of the
largest abscess.

Mild periportal edema in the liver. No discrete liver lesion.
Gallbladder with small Phrygian cap again noted. The gallbladder
fundus is located near the crescentic abscess, but not definitely
involved. Portal venous system appears patent. Aortoiliac calcified
atherosclerosis noted. Major arterial structures in the abdomen and
pelvis are patent. Negative bladder. Possible small urethral
diverticulum. Surgical clips in the epigastric region again noted.
Spleen, pancreas and adrenal glands remain normal. Kidneys are
stable. Right mid pole nephrolithiasis. No renal obstruction.
IMPRESSION: 1. Recent postoperative changes with a fairly large rim enhancing
fluid collection in the deep lower abdomen and pelvis most
compatible with abscess. See series 2, image 59. Smaller abscesses
in the right upper quadrant near the ostomy (images 38, and 41).
More simple appearing postoperative subcutaneous fluid along the
incision site, likely seroma. No pneumoperitoneum.

2.  Mildly dilated mid small bowel loops, favor ileus.

Study discussed by telephone with hospitalist provider Jayraz Mollye

## 2014-11-15 IMAGING — CR DG CHEST 2V
2 series · 2 of 2 positions shown · non-contrast
Comparison: 03/21/2013

CLINICAL DATA: Fever. Shortness breath. Abdominal pain. Postop or
small bowel obstruction and hernia repair.

EXAM:
CHEST  2 VIEW

[w chest pa]
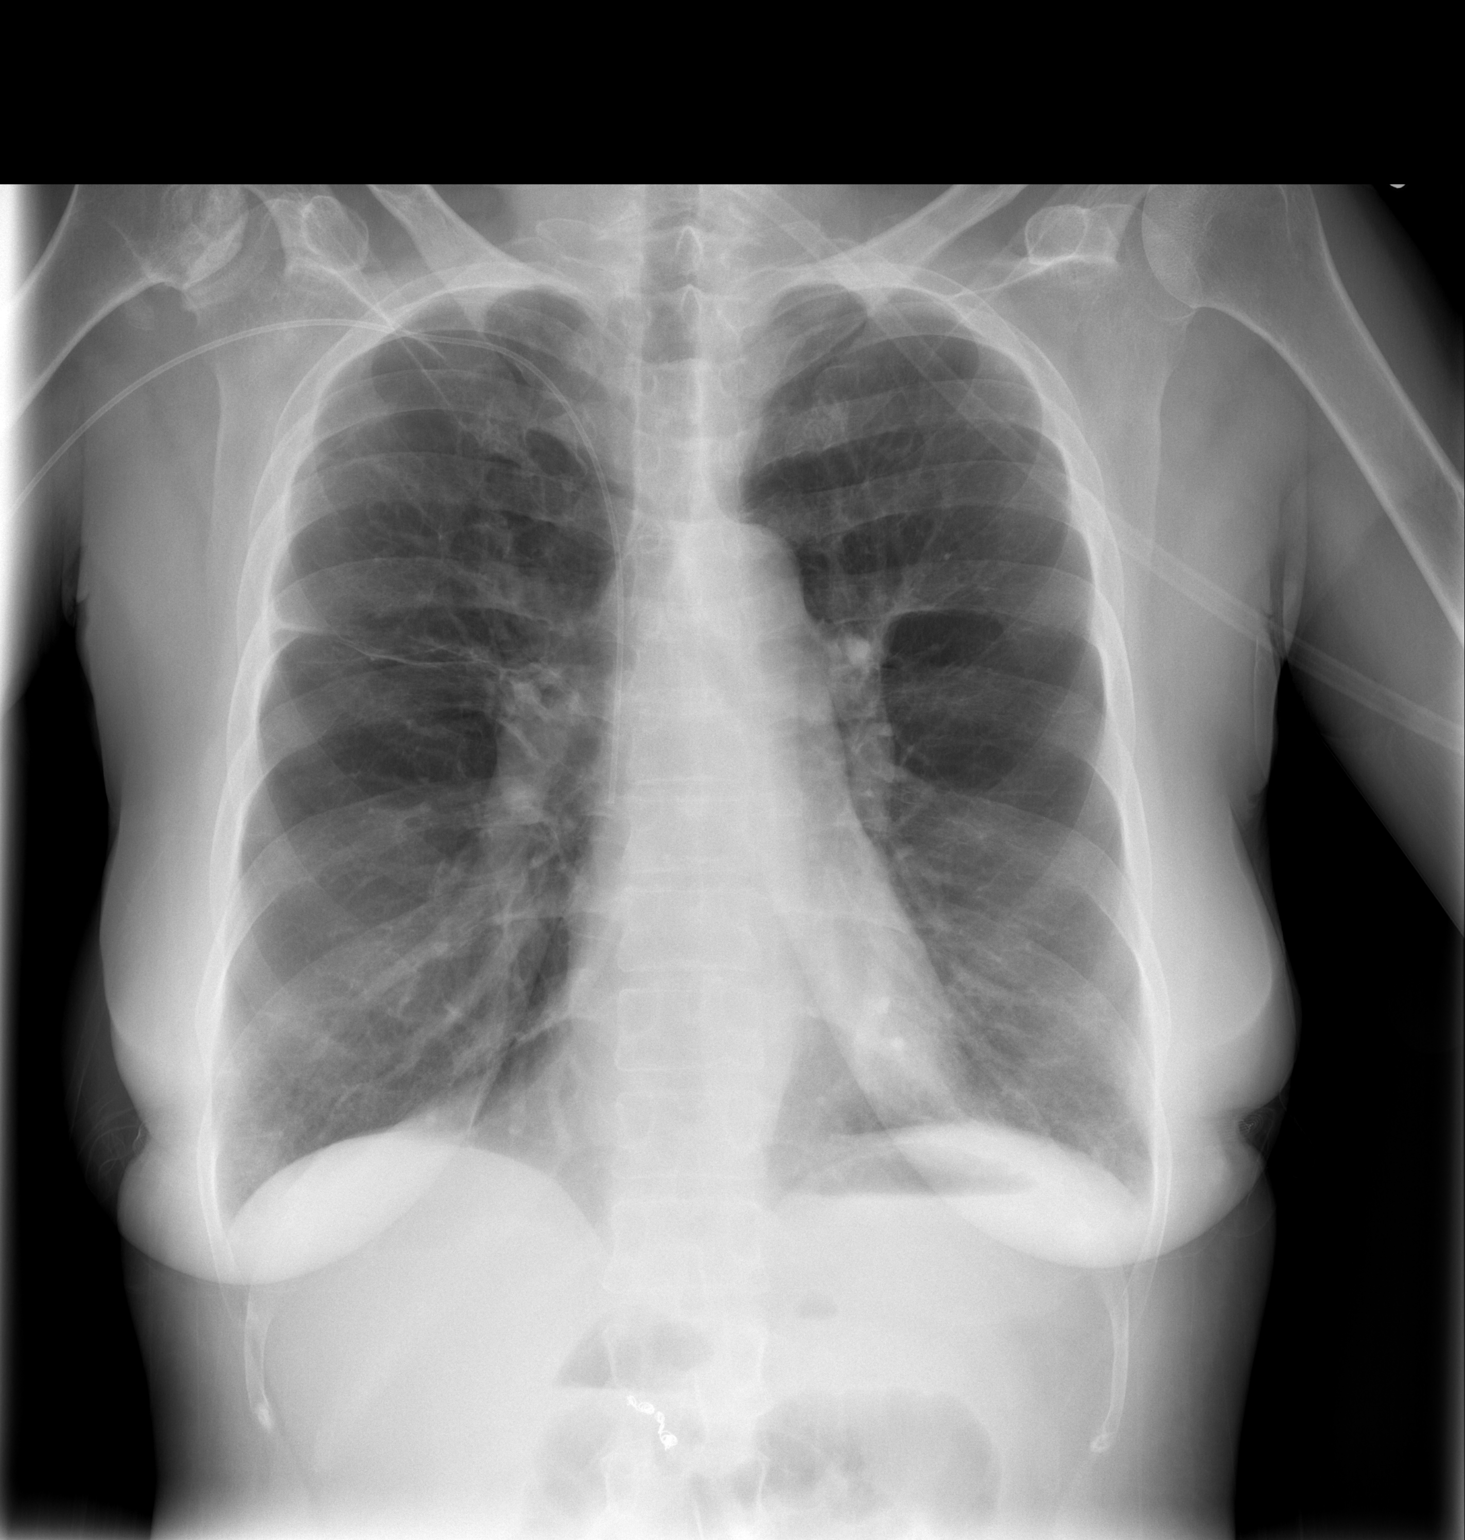

[w chest lat]
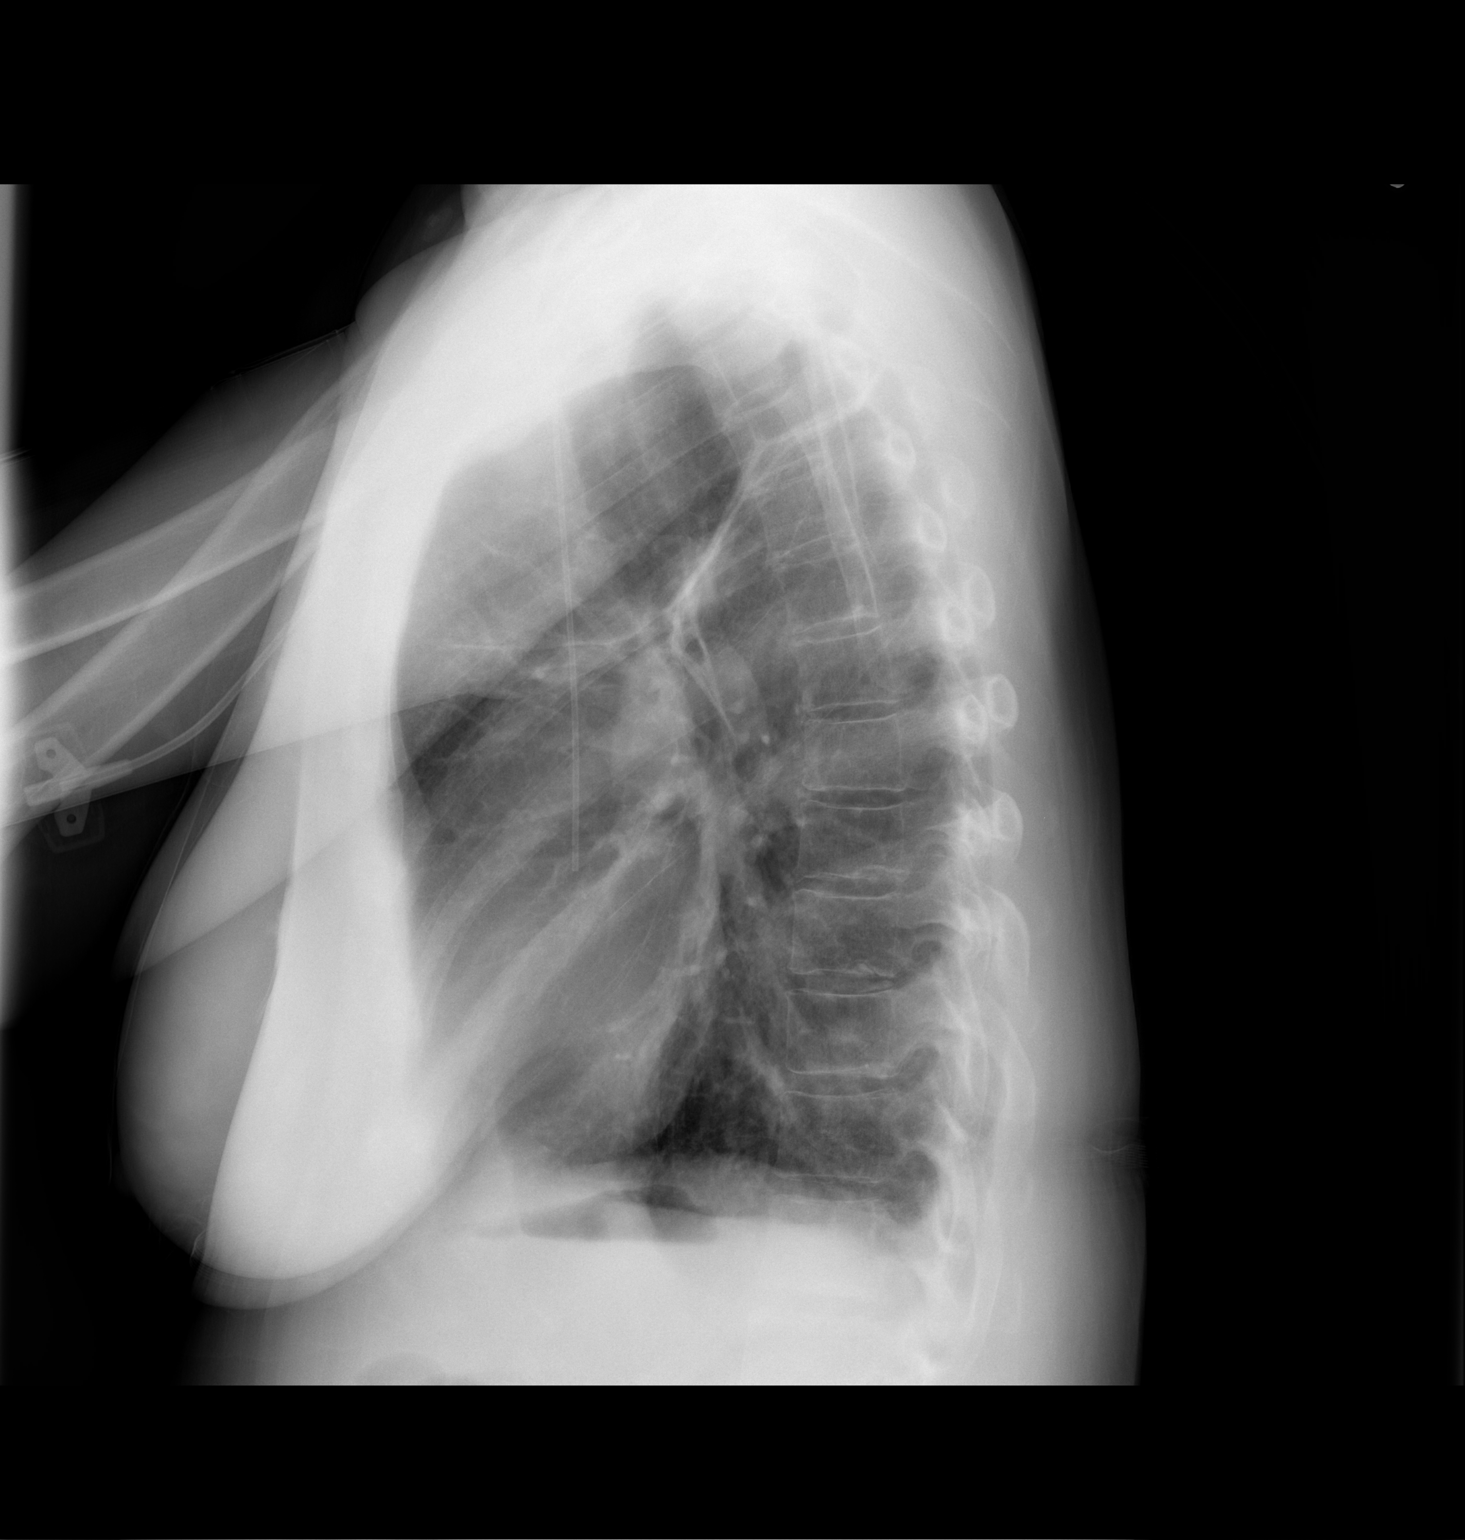

[2 of 2 positions shown; findings below may reference images not displayed]

FINDINGS: Right arm PICC line is seen with tip overlying the superior
cavoatrial junction.

Pulmonary emphysema again demonstrated. No evidence of pulmonary
infiltrate or edema. No evidence of pleural effusion. Heart size is
normal. No mass or lymphadenopathy identified. Chronic avascular
necrosis and secondary osteoarthritis again seen involving the right
glenohumeral joint.
IMPRESSION: New right arm PICC line in appropriate position.

Emphysema. No active disease.

## 2014-11-16 IMAGING — CT CT CORE BIOPSY RENAL
1 of 2 series · 15 of 32 positions shown, 19 images · non-contrast
Comparison: none

CLINICAL DATA: POSTOP pelvic ABSCESS, ELEVATED WHITE COUNT, FEVER

[Series 2: i-spiral 5.0 b40f · axial · 0.83mm/px · z∈[-219,-96]mm · 15 of 39 slices shown, 19 images]
[im 2/39  soft-tissue]
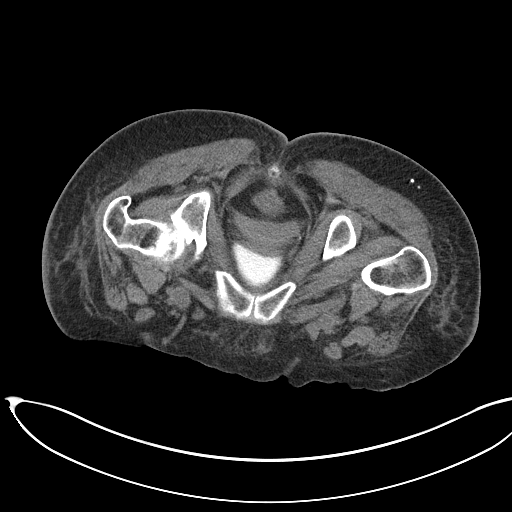
[im 2/39  bone]
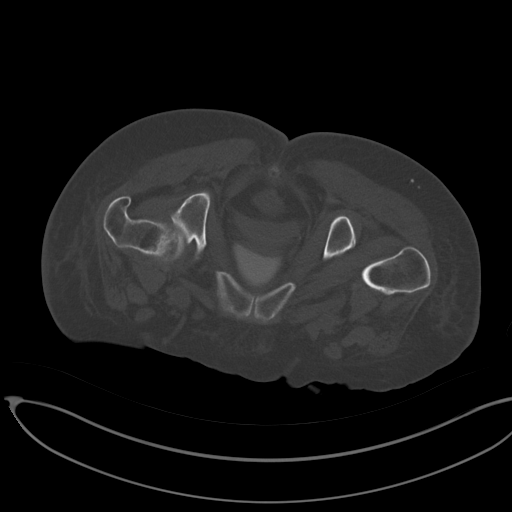
[im 6/39  soft-tissue]
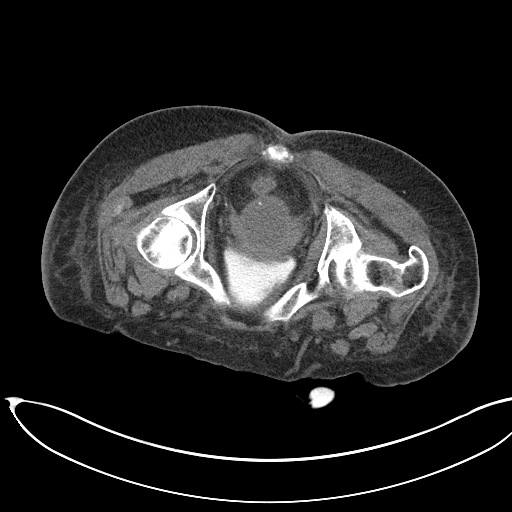
[im 9/39  soft-tissue]
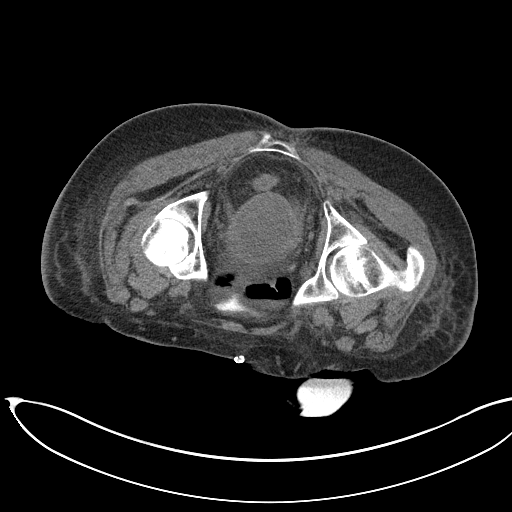
[im 11/39  soft-tissue]
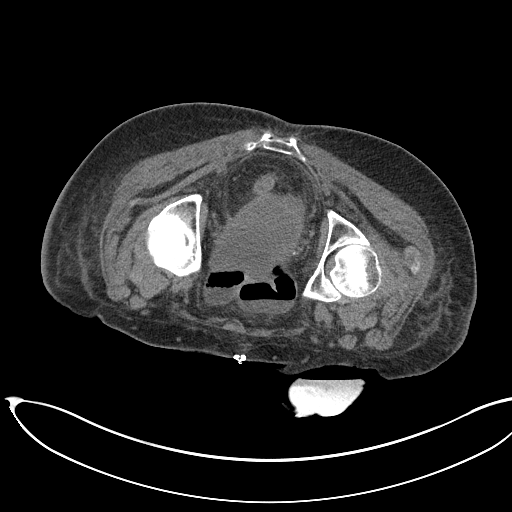
[im 14/39  soft-tissue]
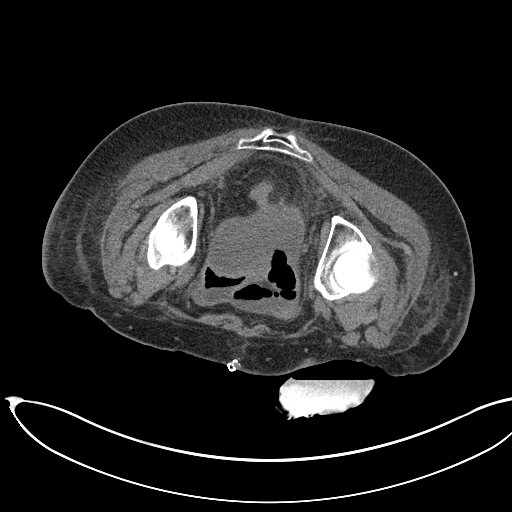
[im 16/39  soft-tissue]
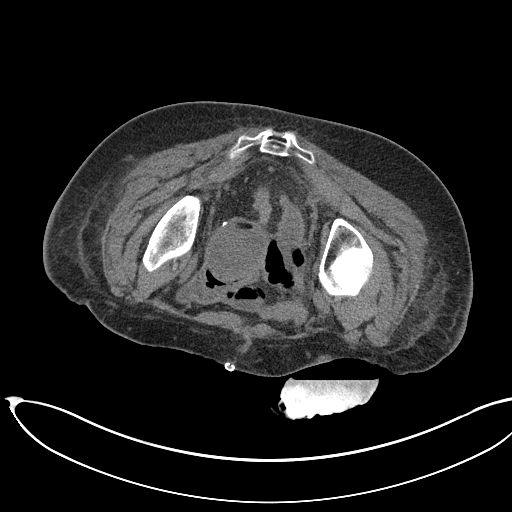
[im 20/39  soft-tissue]
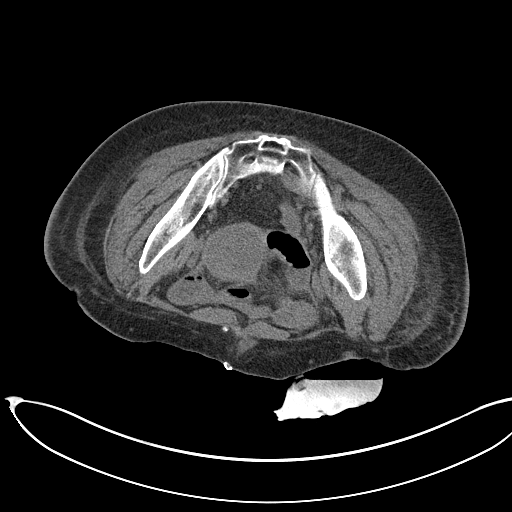
[im 23/39  soft-tissue]
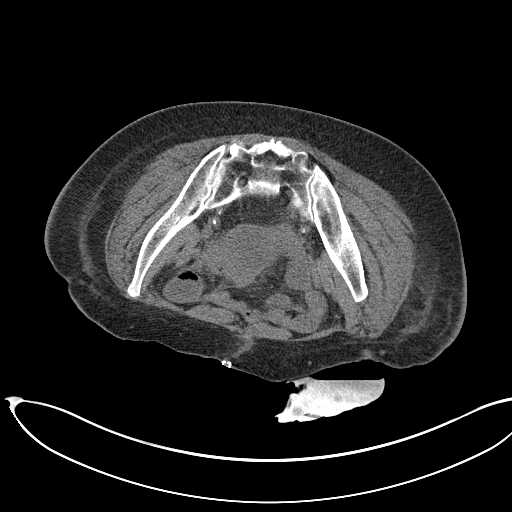
[im 25/39  soft-tissue]
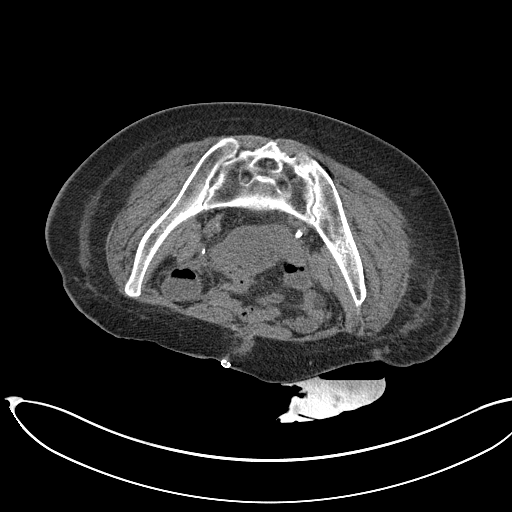
[im 25/39  bone]
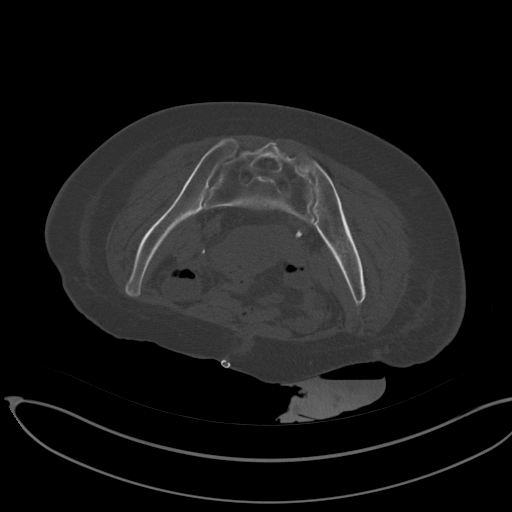
[im 28/39  soft-tissue]
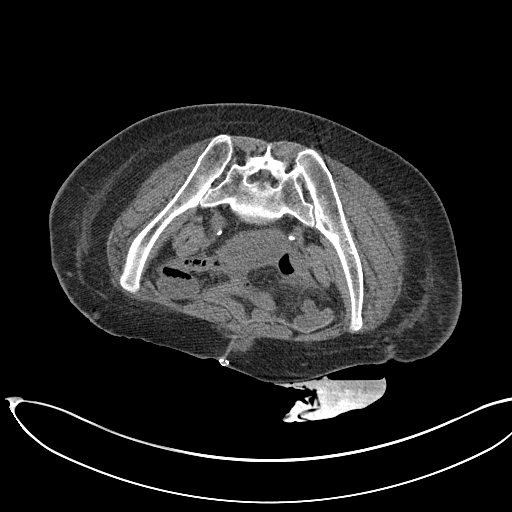
[im 30/39  soft-tissue]
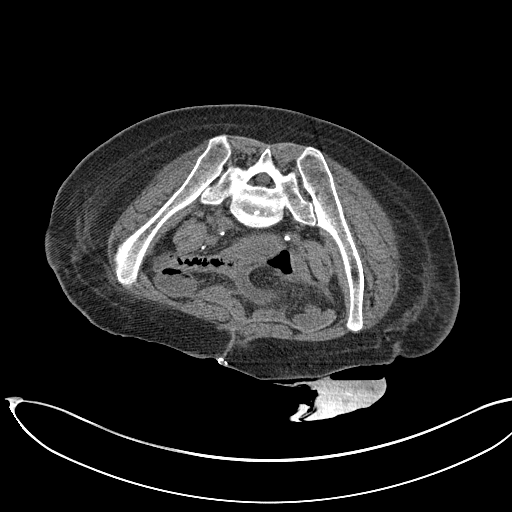
[im 32/39  lung]
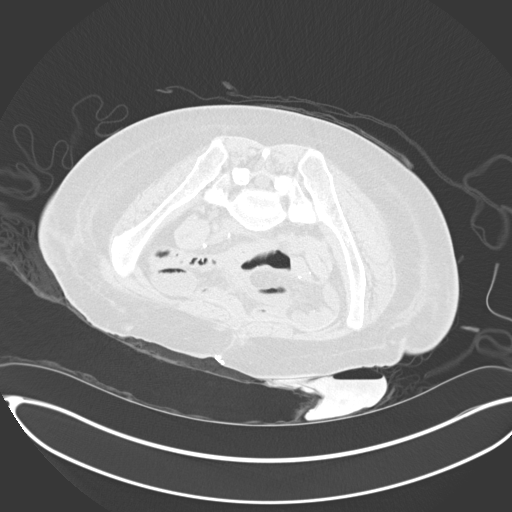
[im 33/39  soft-tissue]
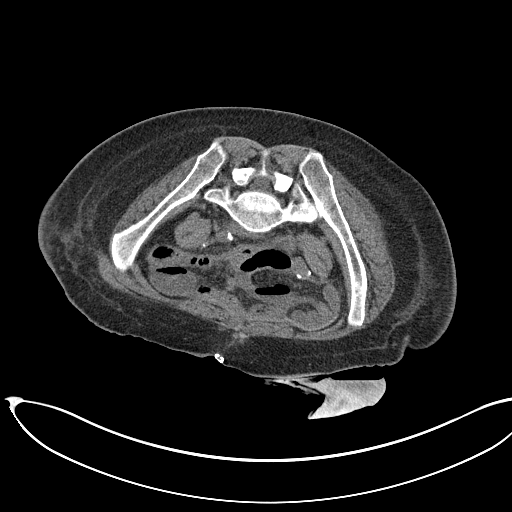
[im 33/39  lung]
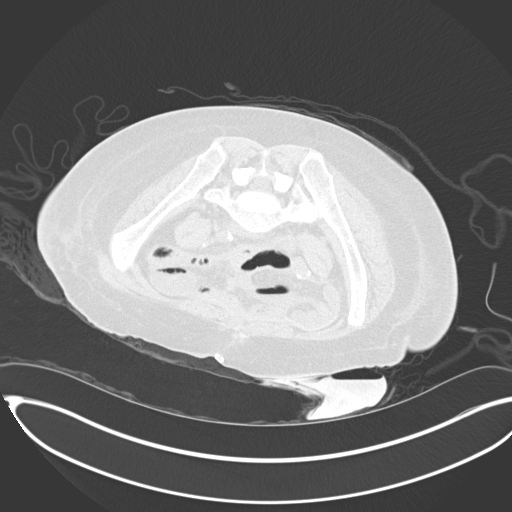
[im 35/39  lung]
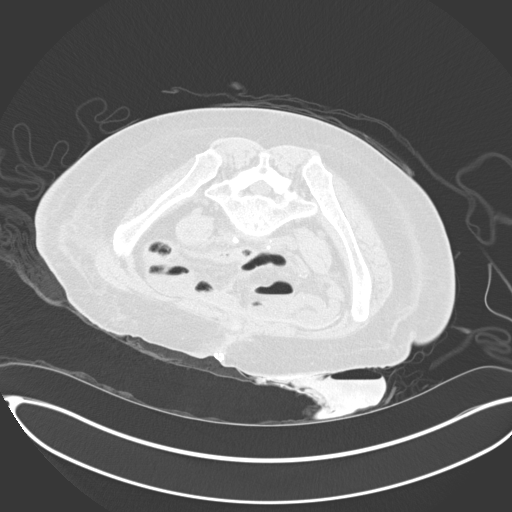
[im 37/39  soft-tissue]
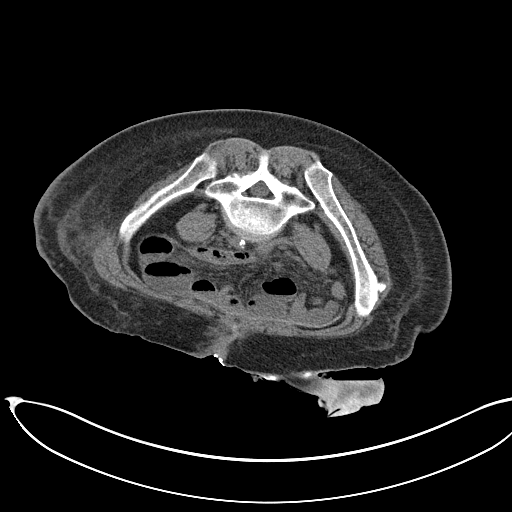
[im 37/39  lung]
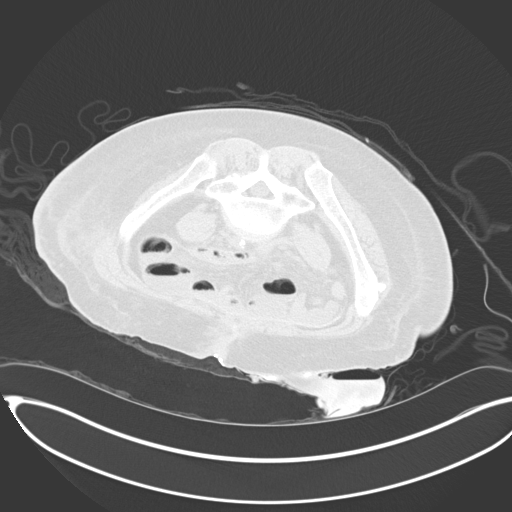

[15 of 32 positions shown; findings below may reference images not displayed]

EXAM:
CT-GUIDED TRANS GLUTEAL PELVIC ABSCESS DRAIN INSERTION

Date:  [DATE] [DATE]

Radiologist:  Bonham, Junko

Guidance:  CT

MEDICATIONS AND MEDICAL HISTORY:
4 MG VERSED, 150 MCG FENTANYL

ANESTHESIA/SEDATION:
35 MIN

CONTRAST:  NONE.

FLUOROSCOPY TIME:  NONE.

PROCEDURE:
Informed consent was obtained from the patient following explanation
of the procedure, risks, benefits and alternatives. The patient
understands, agrees and consents for the procedure. All questions
were addressed. A time out was performed.

Maximal barrier sterile technique utilized including caps, mask,
sterile gowns, sterile gloves, large sterile drape, hand hygiene,
and BETADINE.

Previous imaging reviewed. Patient was positioned prone. Noncontrast
localization CT performed. The pelvic abscess was localized. Under
sterile conditions and local anesthesia, an 18 gauge 10 cm access
needle was advanced from a right trans gluteal approach into the
collection. There was return of purulent fluid. Sample sent for Gram
stain culture. Guidewire inserted followed by tract dilatation to
insert 10 French drain. Catheter position confirmed with CT. Syringe
aspiration yielded 155 cc purulent fluid. Catheter is secured with a
Prolene suture and connected to external suction bulb. Sterile
dressing applied. No immediate complication.

COMPLICATIONS:
NO IMMEDIATE
IMPRESSION: Successful CT-guided right trans gluteal pelvic abscess drain
insertion

## 2014-11-20 IMAGING — CR DG ABD PORTABLE 1V
1 series · 1 of 1 positions shown · non-contrast
Comparison: None.

CLINICAL DATA: Abdominal pain.

EXAM:
PORTABLE ABDOMEN - 1 VIEW

[AP]
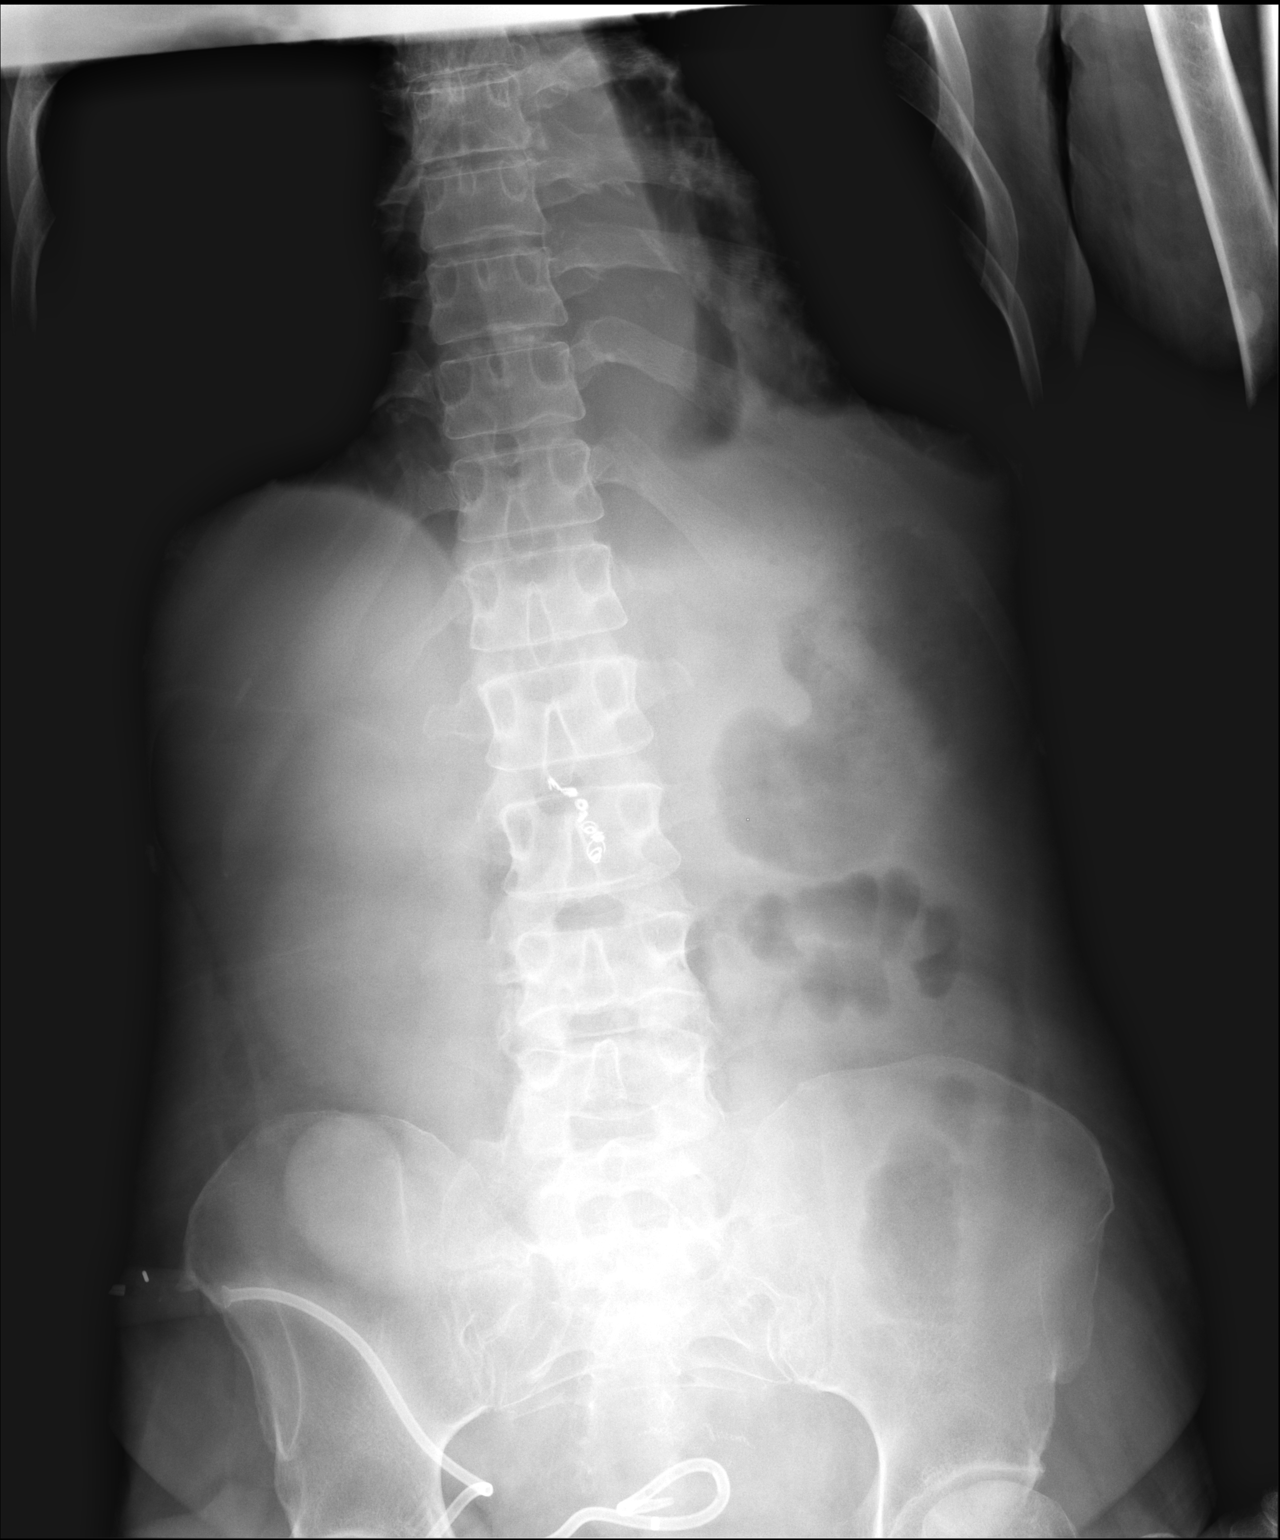

[1 of 1 positions shown; findings below may reference images not displayed]

FINDINGS: There is a pelvic percutaneous drainage catheter present. There are
embolization coils noted in the mid abdomen. There is no bowel
dilatation to suggest obstruction. There is no evidence of
pneumoperitoneum, portal venous gas or pneumatosis. There are no
pathologic calcifications along the expected course of the
ureters.The osseous structures are unremarkable.
IMPRESSION: Percutaneous pelvic drainage catheter in place. Otherwise
unremarkable KUB.

## 2014-11-22 IMAGING — CT CT ABD-PELV W/ CM
2 of 5 series · 16 of 46 positions shown, 18 images · IV contrast (CONTRAST)
Comparison: 03/27/2013.

CLINICAL DATA: FOLLOW UP pelvic drain for clearing of abscess

EXAM:
CT ABDOMEN AND PELVIS WITH CONTRAST
TECHNIQUE: Multidetector CT imaging of the abdomen and pelvis was performed
using the standard protocol following bolus administration of
intravenous contrast.
CONTRAST:  100mL OMNIPAQUE IOHEXOL 300 MG/ML  SOLN

[Series 2: routine · axial · 0.54mm/px · z∈[-775,-405]mm · 13 of 84 slices shown, 15 images]
[im 5/84  soft-tissue]
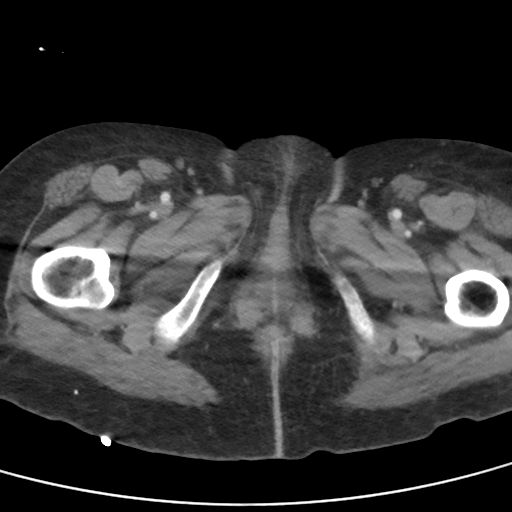
[im 5/84  bone]
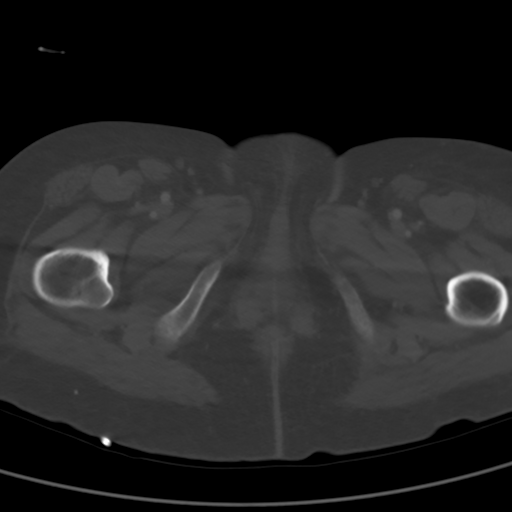
[im 10/84  soft-tissue]
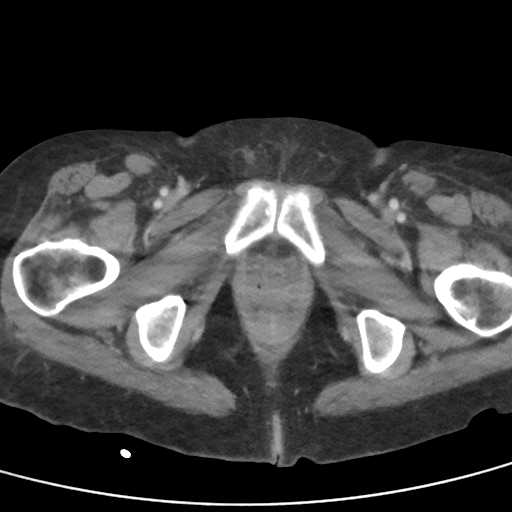
[im 19/84  soft-tissue]
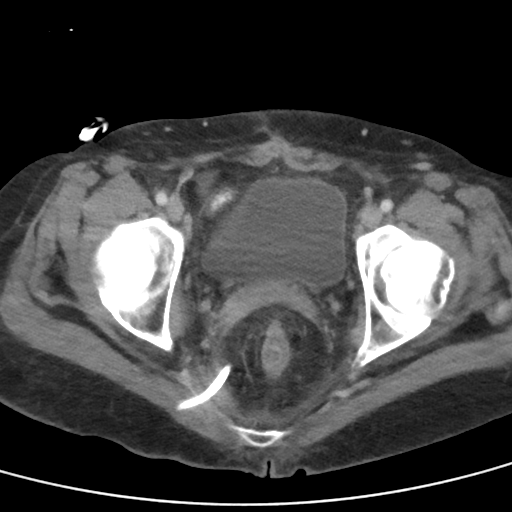
[im 24/84  soft-tissue]
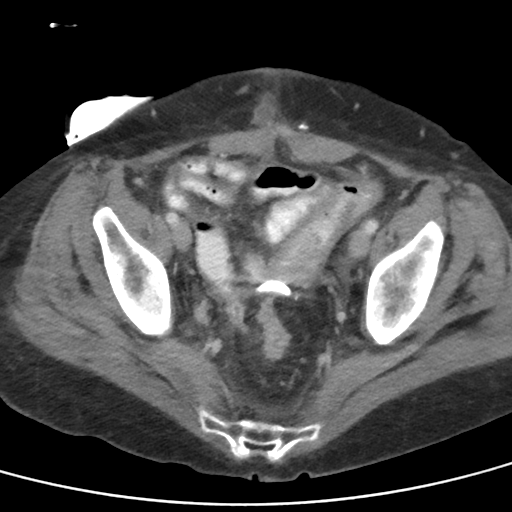
[im 28/84  soft-tissue]
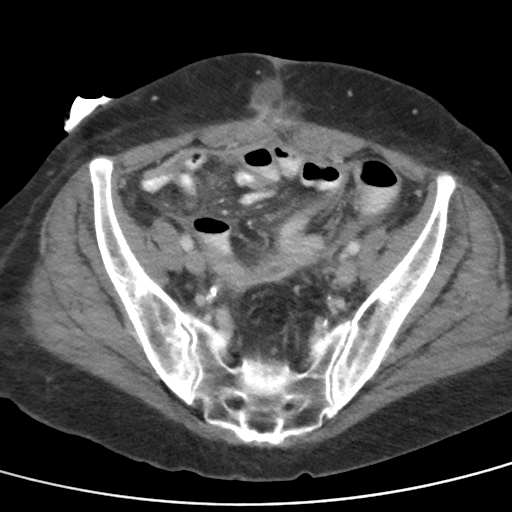
[im 37/84  soft-tissue]
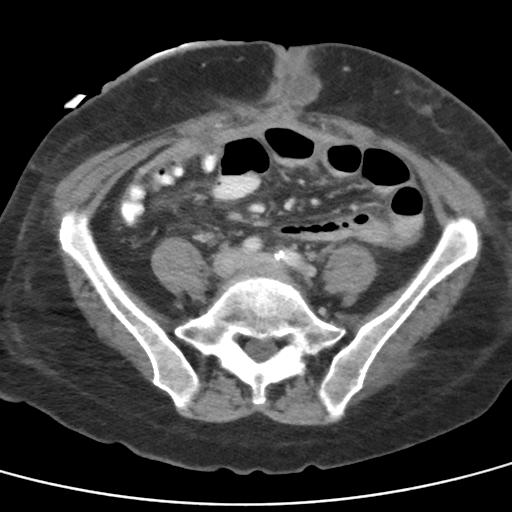
[im 42/84  soft-tissue]
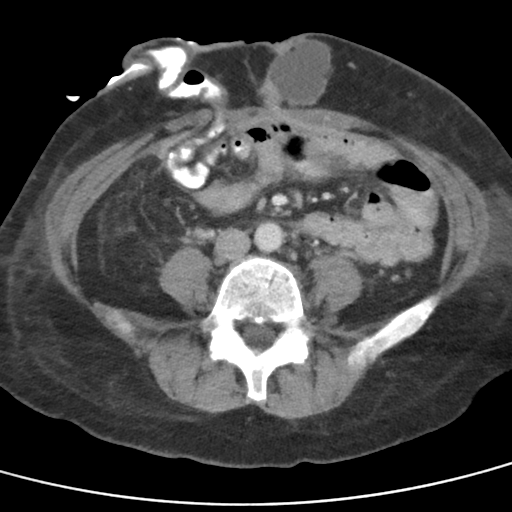
[im 47/84  soft-tissue]
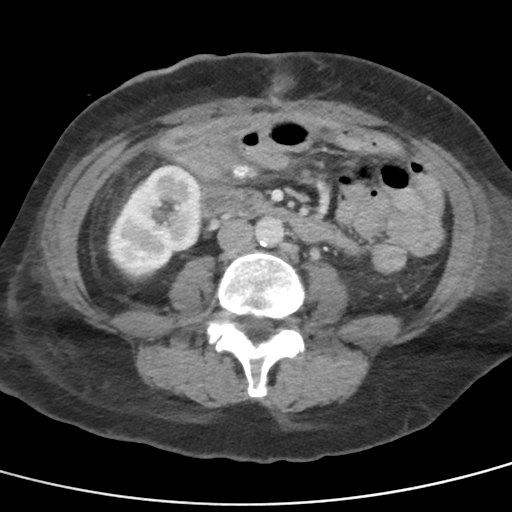
[im 56/84  soft-tissue]
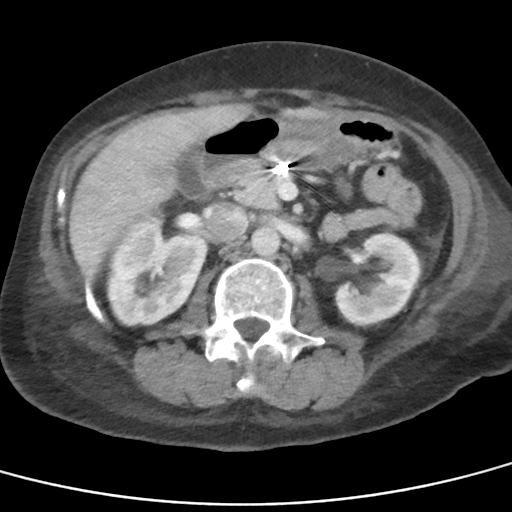
[im 56/84  bone]
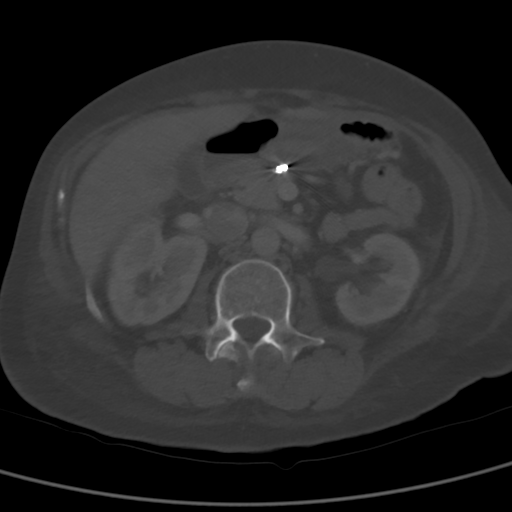
[im 60/84  soft-tissue]
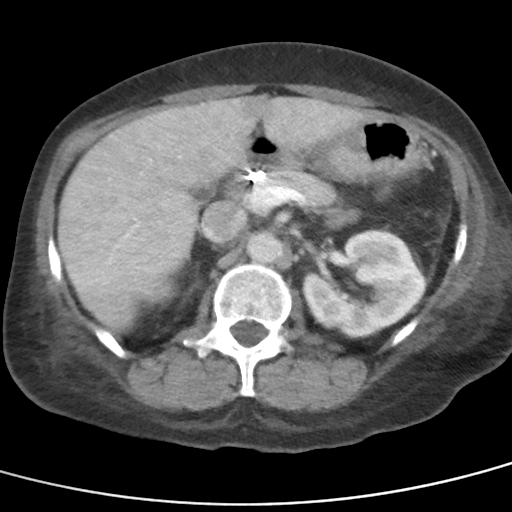
[im 65/84  soft-tissue]
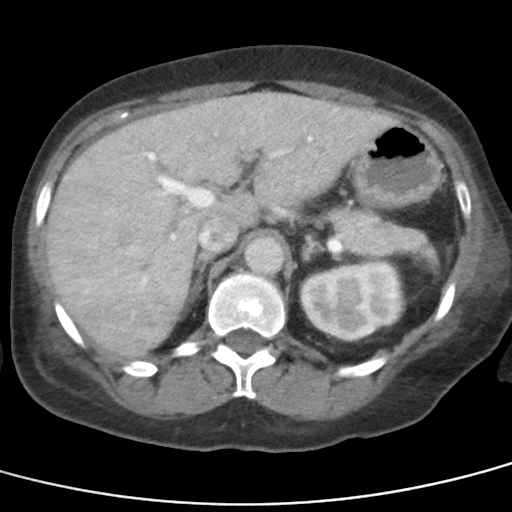
[im 74/84  soft-tissue]
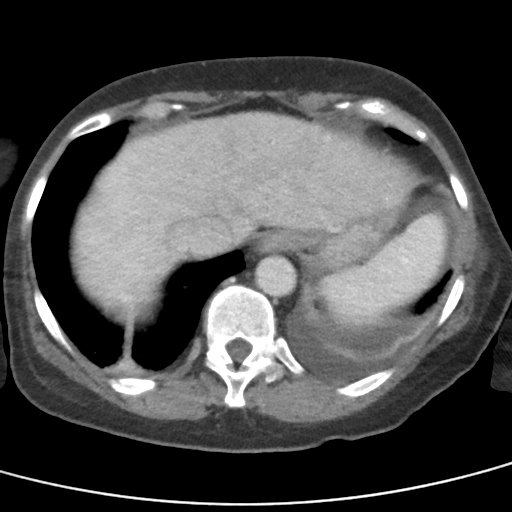
[im 79/84  soft-tissue]
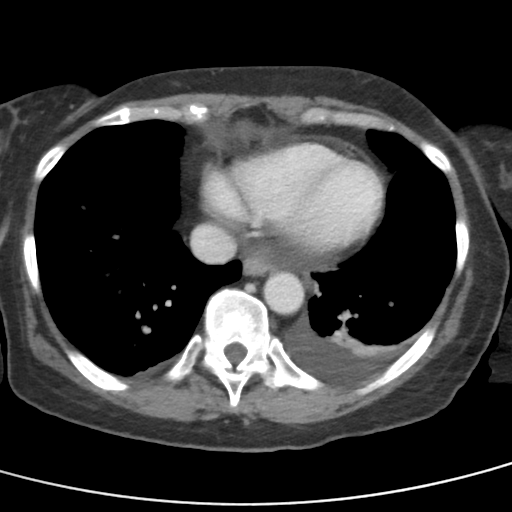

[coronal · coronal · 0.81mm/px · 3 of 107 slices shown]
[im 36/107  soft-tissue]
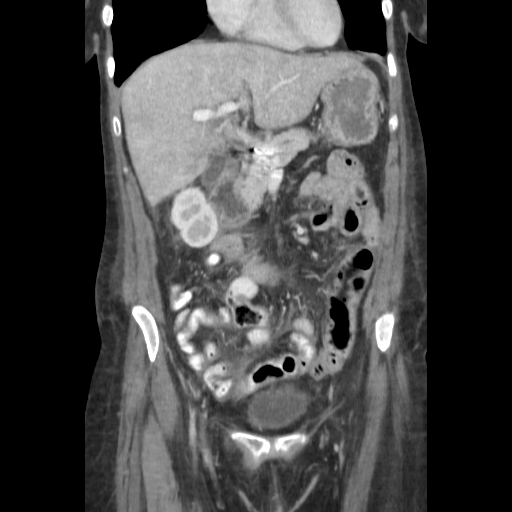
[im 48/107  soft-tissue]
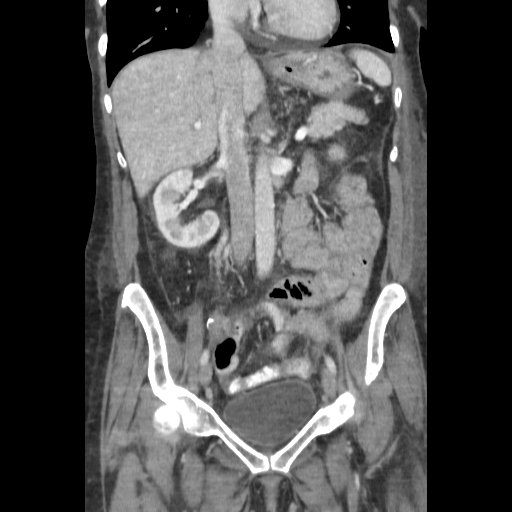
[im 59/107  soft-tissue]
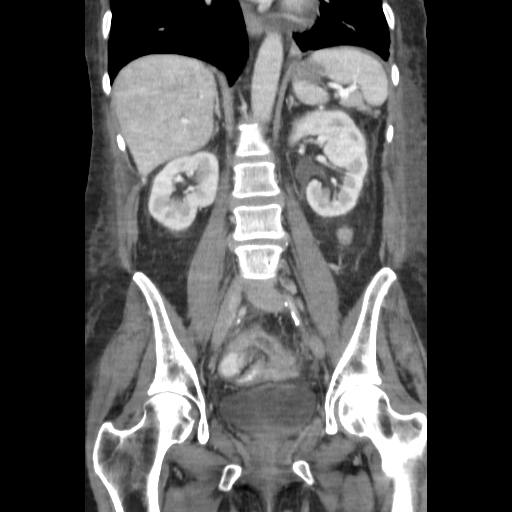

[16 of 46 positions shown; findings below may reference images not displayed]

FINDINGS: There is a trace right pleural effusion with basilar atelectasis.
There is a small left pleural effusion.

The liver demonstrates no focal abnormality. There is no
intrahepatic or extrahepatic biliary ductal dilatation. The
gallbladder is normal. The spleen demonstrates no focal abnormality.
The kidneys, adrenal glands and pancreas are normal. The bladder is
unremarkable.

There are surgical clips in the epigastric region. The stomach,
duodenum, small intestine, and large intestine demonstrate no
contrast extravasation or dilatation. There is a right lower
quadrant enterostomy. There is a pigtail catheter present within the
pelvis with and near complete resolution of the complex pelvic fluid
collection with only a tiny focus remaining measuring approximately
8.6 mm. There is a persistent small fluid collection with rim
enhancement in the right upper anterior abdomen measuring 5 x 1 cm
on image 38 series 2. This is essentially unchanged from the prior
exam. There is a 3.9 x 3.5 cm left periumbilical fluid collection
within the subcutaneous fat likely representing a postoperative
seroma. There is no pneumoperitoneum, pneumatosis, or portal venous
gas. There is no abdominal or pelvic free fluid. There is no
lymphadenopathy.

The abdominal aorta is normal in caliber with atherosclerosis.

There are no lytic or sclerotic osseous lesions.
IMPRESSION: 1. There is a pelvic pigtail drainage catheter present with near
complete interval resolution of the complex pelvic fluid collection
with only a small focus remaining measuring 8.6 mm.

2. There is a persistent small fluid collection with rim enhancement
in the right upper anterior abdomen measuring 5 x 1 cm most
concerning for an abscess.

3. Left periumbilical fluid collection within the subcutaneous fat
most consistent with a postoperative seroma.

## 2014-12-08 IMAGING — CT CT ANGIO CHEST
3 of 14 series · 18 of 46 positions shown · IV contrast (APPLIED)
Comparison: CT abdomen and pelvis 04/03/2013, 03/27/2013 and
03/14/2013. CT chest 12/01/2011.

CLINICAL DATA: Chest pain, shortness of breath and abdominal pain.

EXAM:
CT ANGIOGRAPHY CHEST
CT ABDOMEN AND PELVIS WITH CONTRAST
TECHNIQUE: Multidetector CT imaging of the chest was performed using the
standard protocol during bolus administration of intravenous
contrast. Multiplanar CT image reconstructions including MIPs were
obtained to evaluate the vascular anatomy. Multidetector CT imaging
of the abdomen and pelvis was performed using the standard protocol
during bolus administration of intravenous contrast.
CONTRAST:  100 mL OMNIPAQUE IOHEXOL 350 MG/ML SOLN

[Series 5: thins · axial · 0.51mm/px · z∈[+1159,+1412]mm · 13 of 297 slices shown]
[im 22/297  lung]
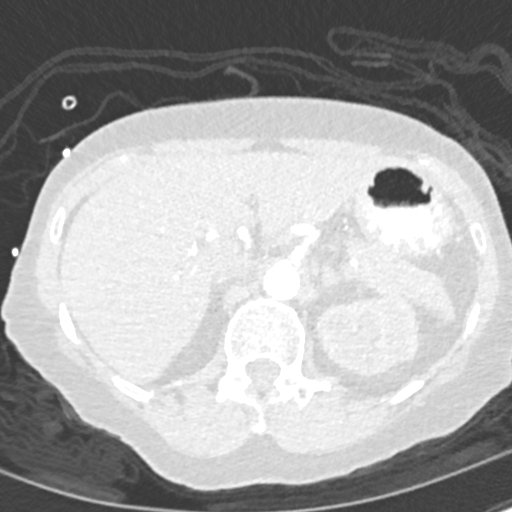
[im 43/297  soft-tissue]
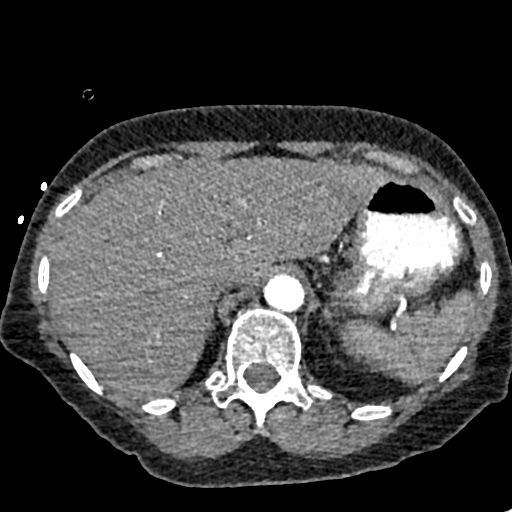
[im 64/297  lung]
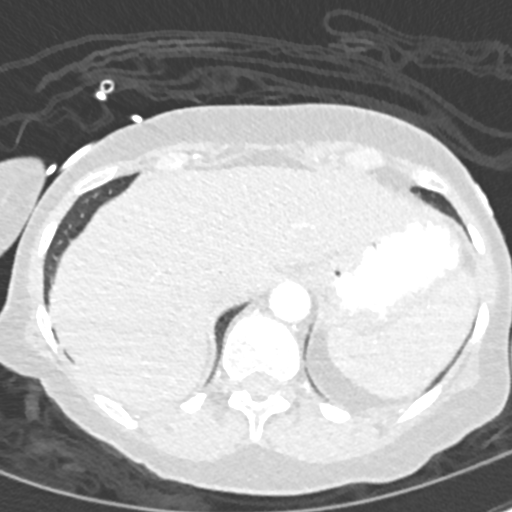
[im 85/297  soft-tissue]
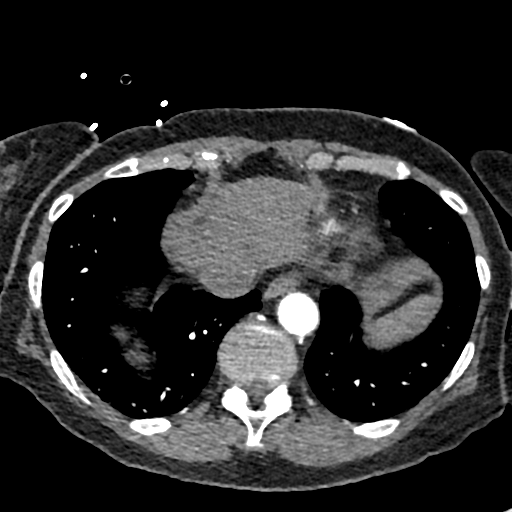
[im 106/297  lung]
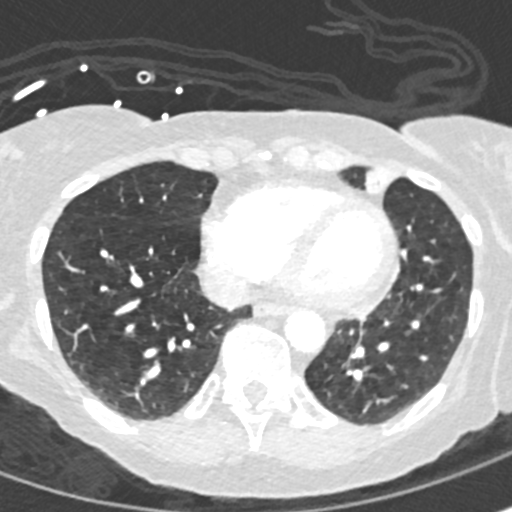
[im 127/297  soft-tissue]
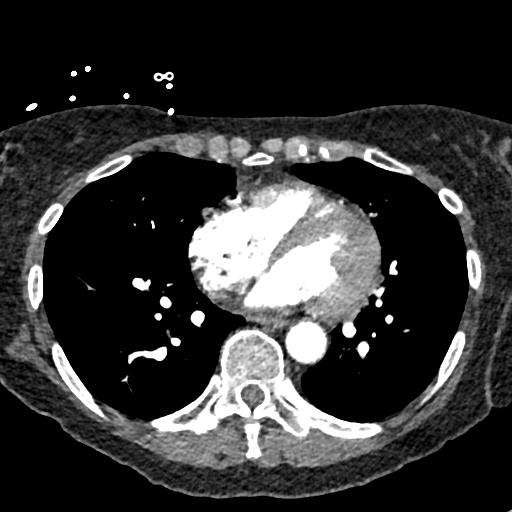
[im 149/297  lung]
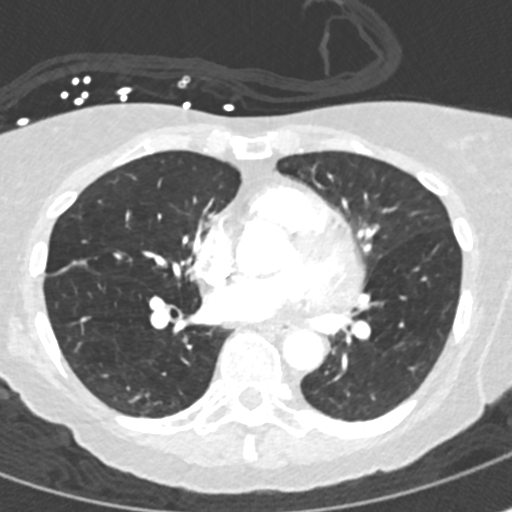
[im 170/297  soft-tissue]
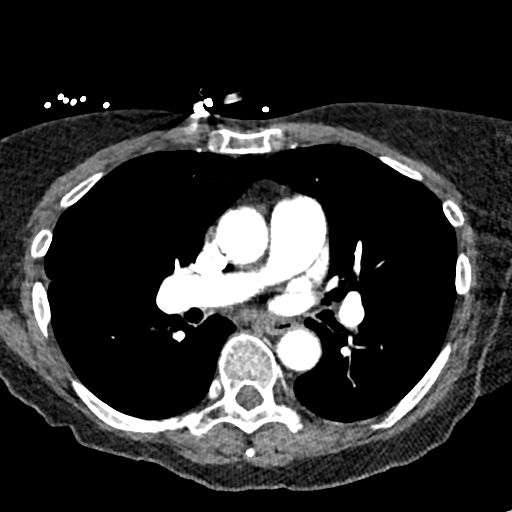
[im 191/297  lung]
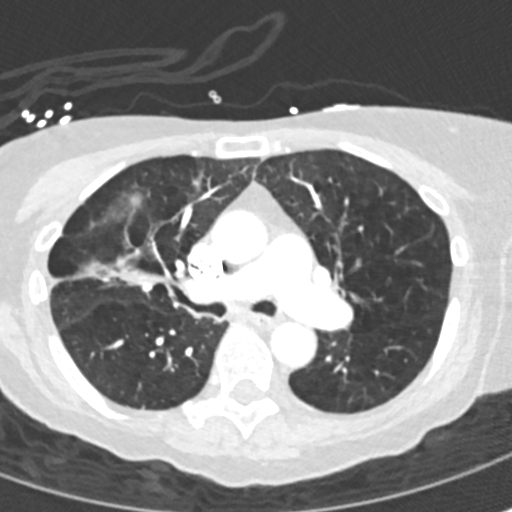
[im 212/297  soft-tissue]
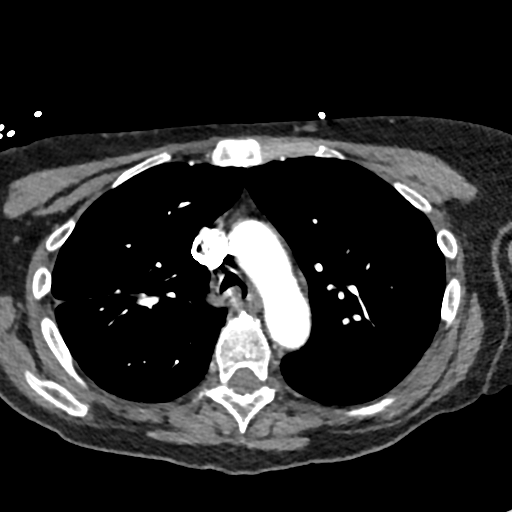
[im 233/297  lung]
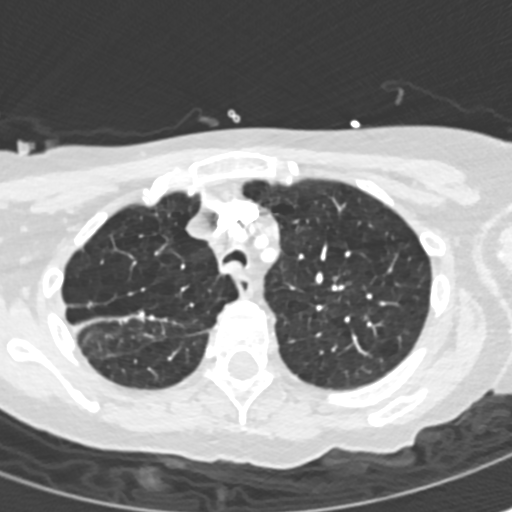
[im 254/297  soft-tissue]
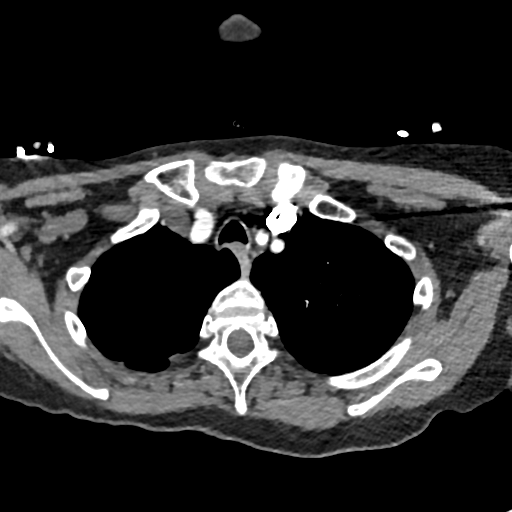
[im 275/297  lung]
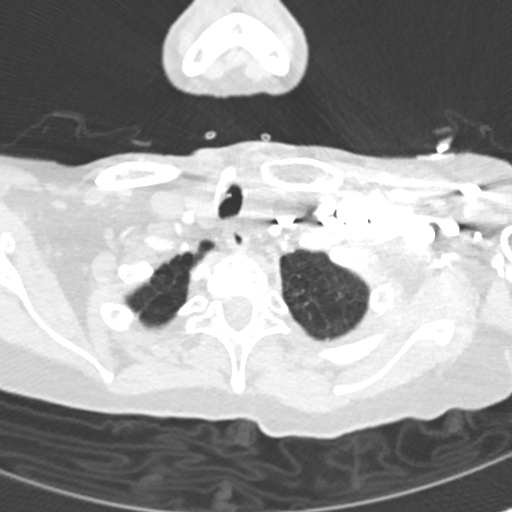

[Series 6: lung · axial · 0.65mm/px · z∈[+1245,+1380]mm · 3 of 91 slices shown]
[im 23/91  soft-tissue]
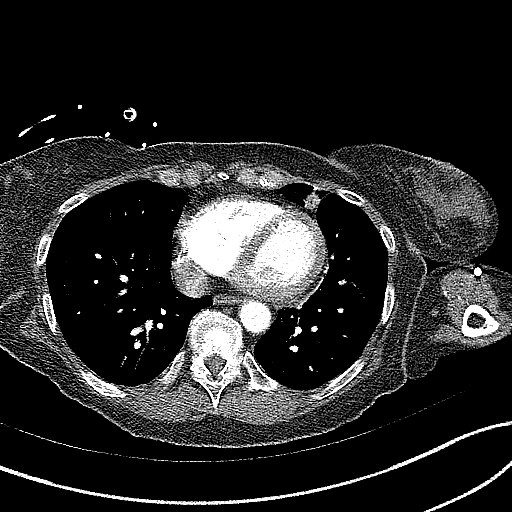
[im 46/91  soft-tissue]
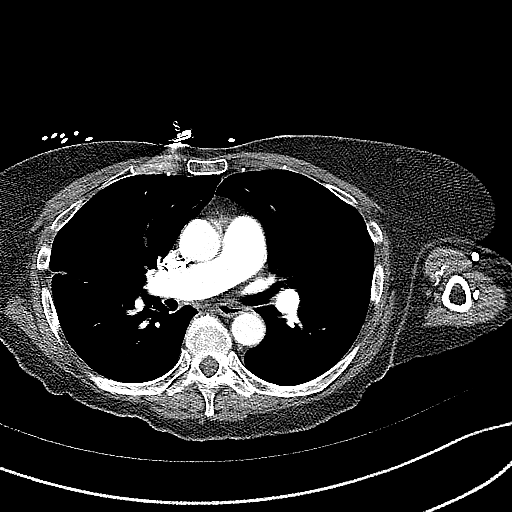
[im 68/91  soft-tissue]
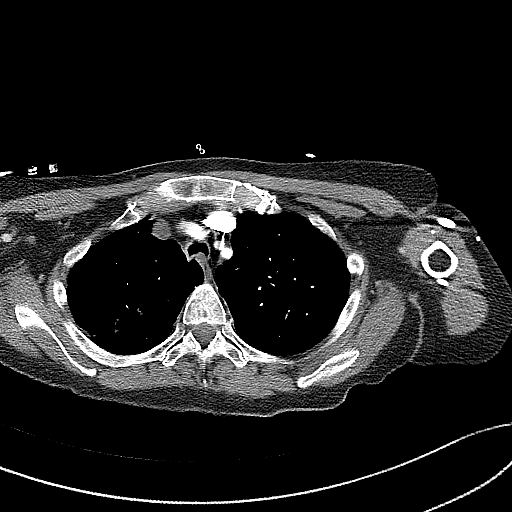

[Series 7: coronal mpr · coronal · 0.56mm/px · 2 of 151 slices shown]
[im 51/151  soft-tissue]
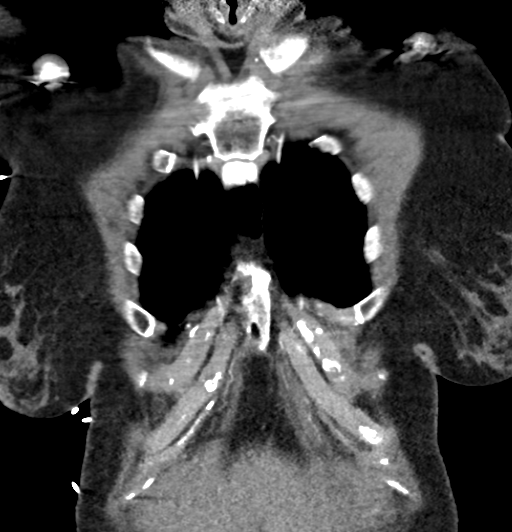
[im 101/151  soft-tissue]
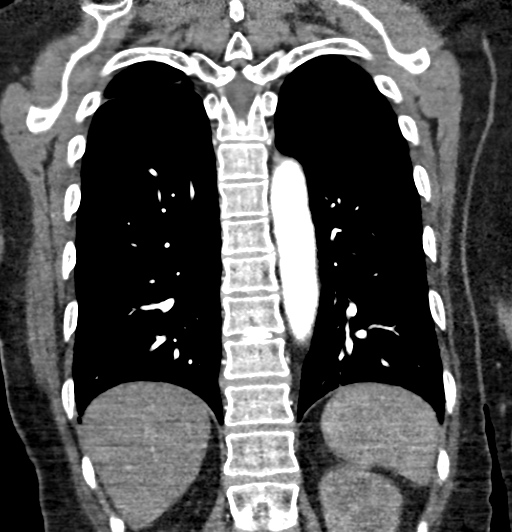

[18 of 46 positions shown; findings below may reference images not displayed]

FINDINGS: CTA CHEST FINDINGS

No pulmonary embolus is identified. Heart size is normal. No pleural
or pericardial effusion. No axillary, hilar or mediastinal
lymphadenopathy. The lungs demonstrate extensive emphysematous
change. There is some scarring in the right upper lobe. The lungs
are otherwise clear.

CT ABDOMEN and PELVIS FINDINGS

Again seen and is postoperative change of colectomy with a
Hartmann's pouch and right lower quadrant ileostomy. Pigtail
catheter in the pelvis seen on the prior examination has been
removed. There is a small fluid collection at the former site of the
catheter measuring 1.7 cm transverse by 4.1 cm AP by 1.6 cm
craniocaudal. Very small subcutaneous fluid collection to the right
of midline seen on the most recent examination has resolved.

There is a single mildly dilated loop of small bowel in the right
hemipelvis with fecalized contents. This loop measures 2.8 cm in
diameter and may be dilated secondary to focal ileus or partial
obstruction possibly related to adhesions or surgical anastomosis in
the right hemipelvis. Distal small bowel loops are completely
decompressed.

The liver, gallbladder, spleen and adrenal glands are unremarkable.
Small renal cysts are noted. There is no lymphadenopathy. Bones
demonstrate avascular necrosis of the femoral heads bilaterally,
worse on the left.

Review of the MIP images confirms the above findings.
IMPRESSION: Status post removal of pigtail catheter from the pelvis. There is a
small, new fluid collection in the pelvis measuring 1.7 cm
transverse by 4.1 cm AP x 1.6 cm craniocaudal which may represent
recurrent abscess.

Mildly dilated loop of small bowel in the pelvis could be due to
focal ileus or partial obstruction secondary to adhesions or
stricture at a surgical anastomosis.

Very small subcutaneous fluid collection to the right of midline
seen on the most recent examination has resolved.

Negative for pulmonary embolus or acute cardiopulmonary disease.

Emphysema.

Avascular necrosis of the femoral heads, worse on the left.

## 2014-12-08 IMAGING — CR DG ABDOMEN ACUTE W/ 1V CHEST
3 series · 3 of 3 positions shown · non-contrast
Comparison: 04/01/2013

CLINICAL DATA: Abdominal pain

EXAM:
ACUTE ABDOMEN SERIES (ABDOMEN 2 VIEW & CHEST 1 VIEW)

[w chest pa]
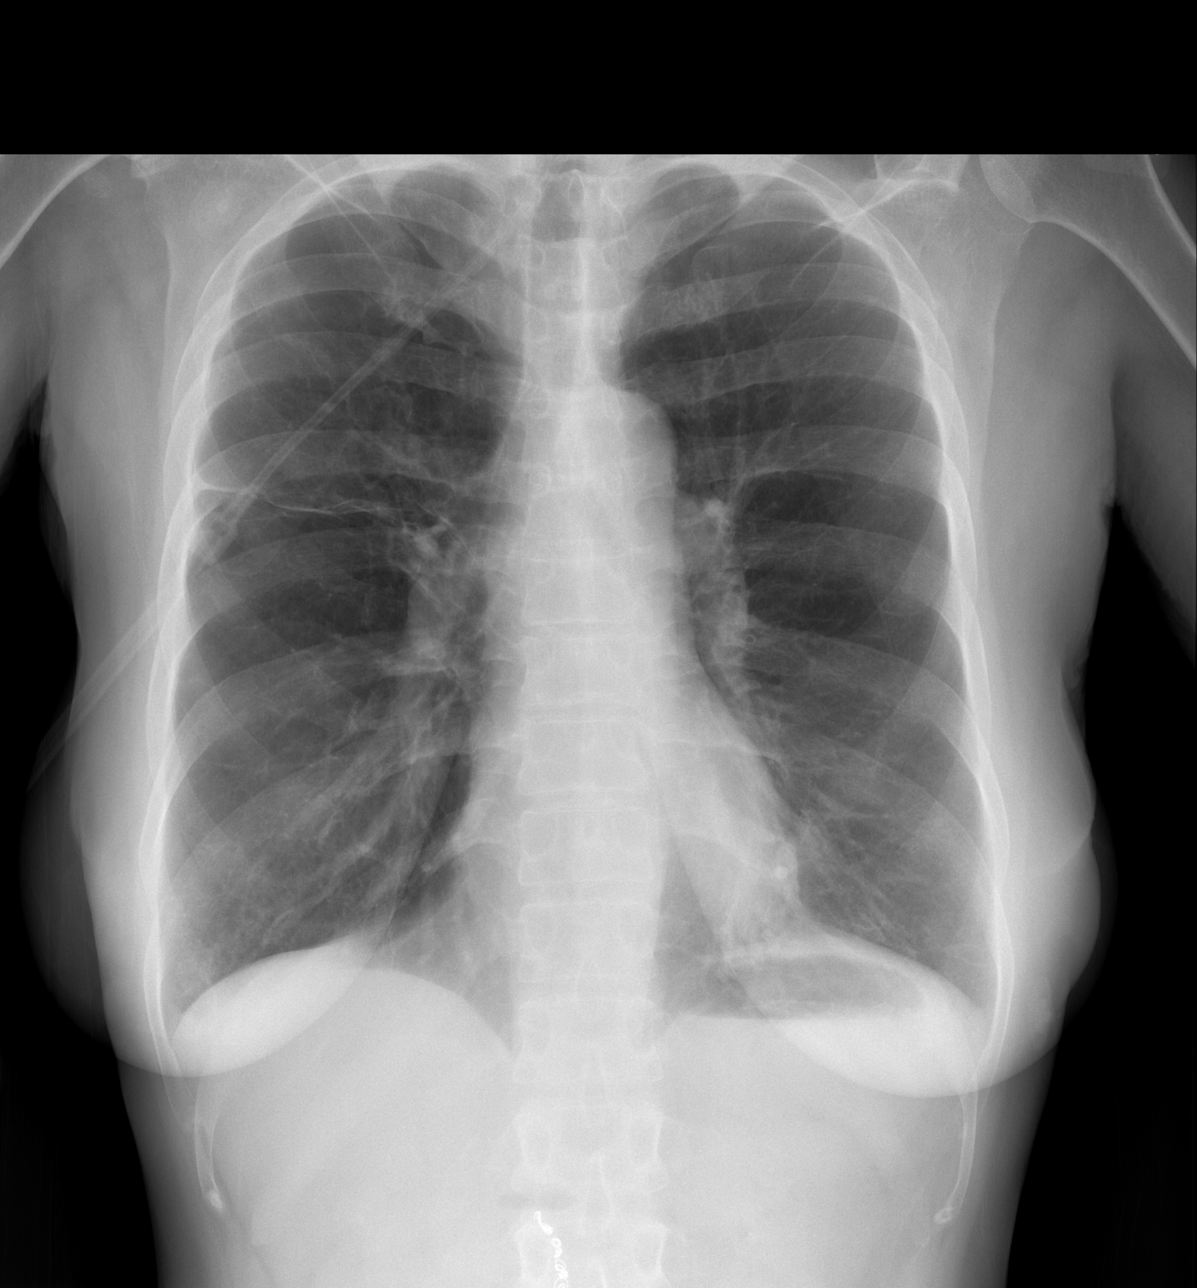

[w abdomen upright]
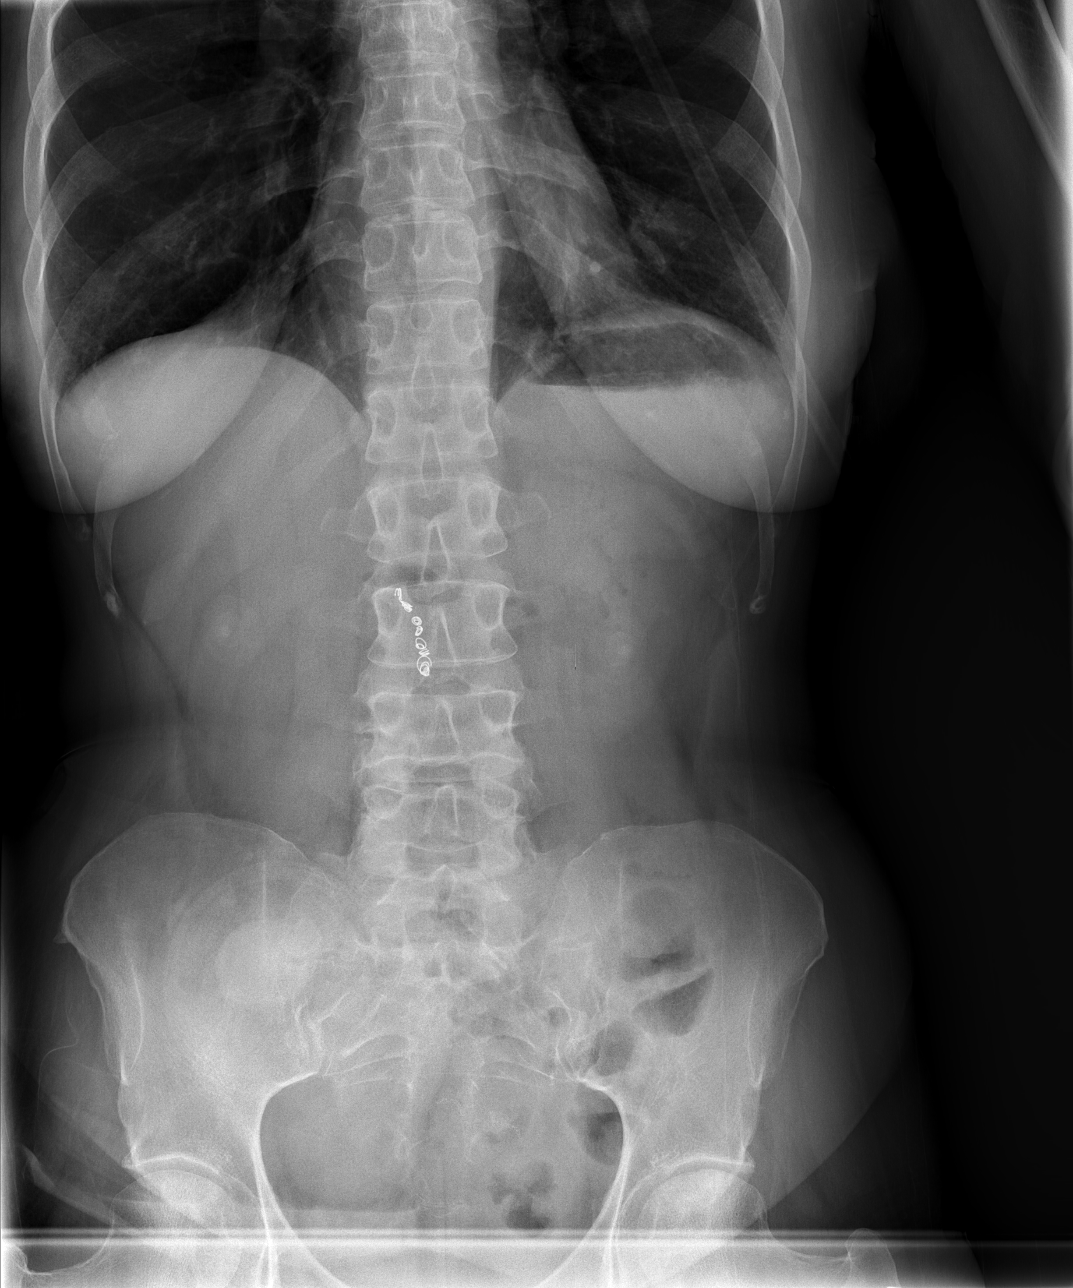

[t abdomen supine]
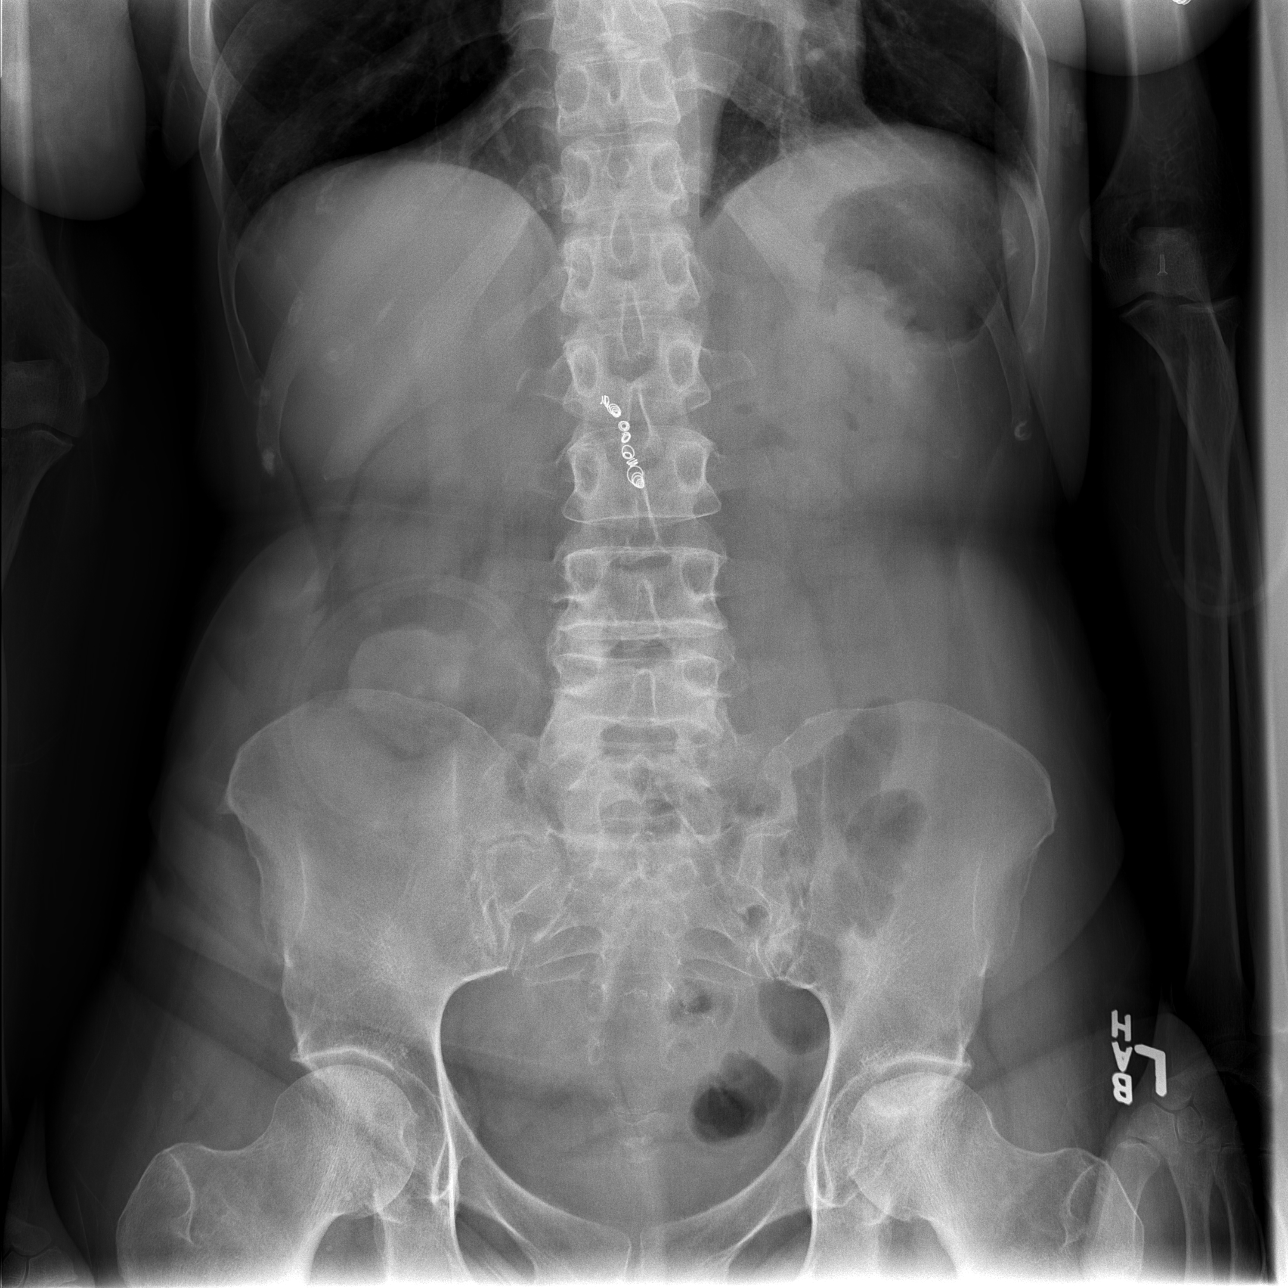

[3 of 3 positions shown; findings below may reference images not displayed]

FINDINGS: Cardiomediastinal silhouette is stable. Hyperinflation. There is
linear scarring in right midlung. No acute infiltrate or pulmonary
edema.

There is nonspecific nonobstructive bowel gas pattern. A colostomy
is noted in right lower quadrant. No free abdominal air.
IMPRESSION: Negative abdominal radiographs.  No acute cardiopulmonary disease.

## 2014-12-15 IMAGING — CT CT ABD-PELV W/ CM
2 of 6 series · 16 of 46 positions shown, 18 images · IV contrast (omnipaque)
Comparison: 04/19/2013

ADDENDUM:
Left greater than right femoral head avascular necrosis is similar
to recent prior.
CLINICAL DATA: Abdominal pain

EXAM:
CT ABDOMEN AND PELVIS WITH CONTRAST
TECHNIQUE: Multidetector CT imaging of the abdomen and pelvis was performed
using the standard protocol following bolus administration of
intravenous contrast.
CONTRAST:  100mL OMNIPAQUE IOHEXOL 300 MG/ML  SOLN

[Series 2: abd/ pelvis 5.0 i30f 1 · axial · 0.68mm/px · z∈[-354,-38]mm · 13 of 75 slices shown, 15 images]
[im 6/75  soft-tissue]
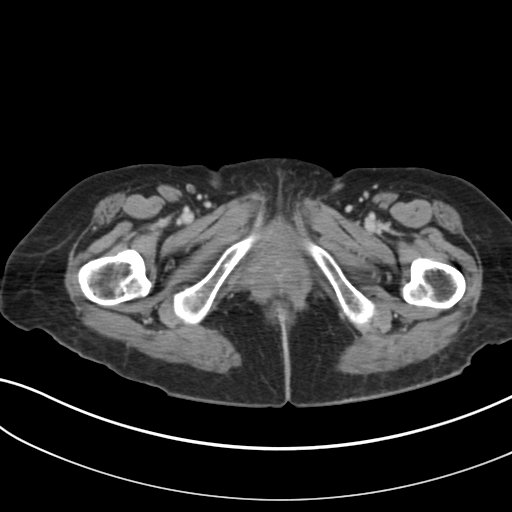
[im 6/75  bone]
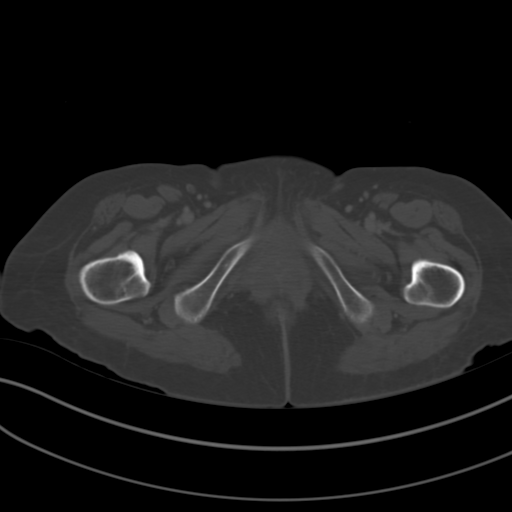
[im 11/75  soft-tissue]
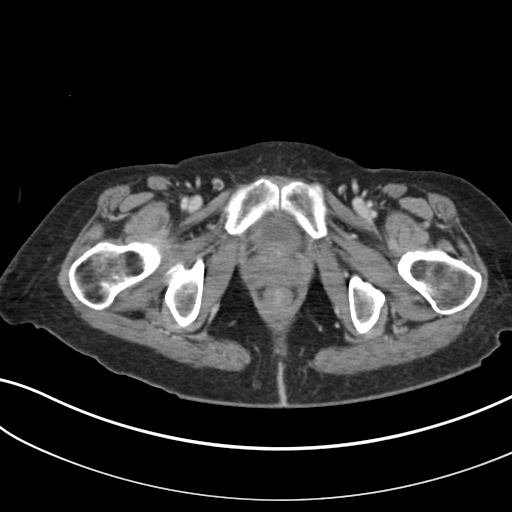
[im 16/75  soft-tissue]
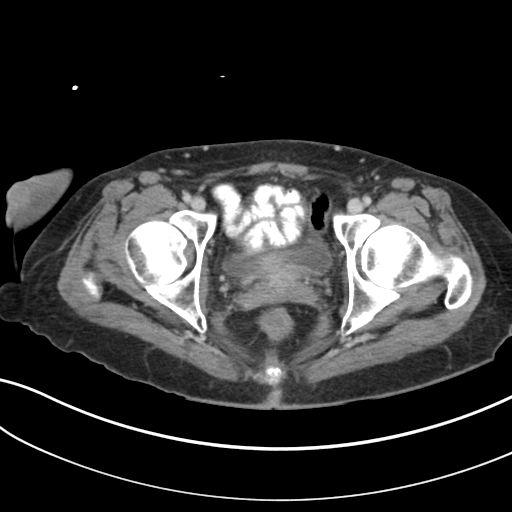
[im 22/75  soft-tissue]
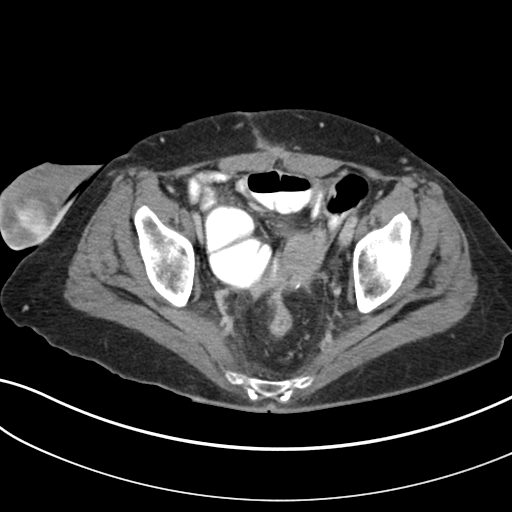
[im 27/75  soft-tissue]
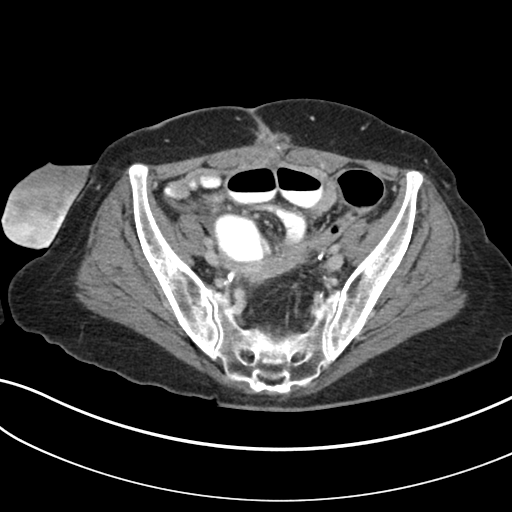
[im 32/75  soft-tissue]
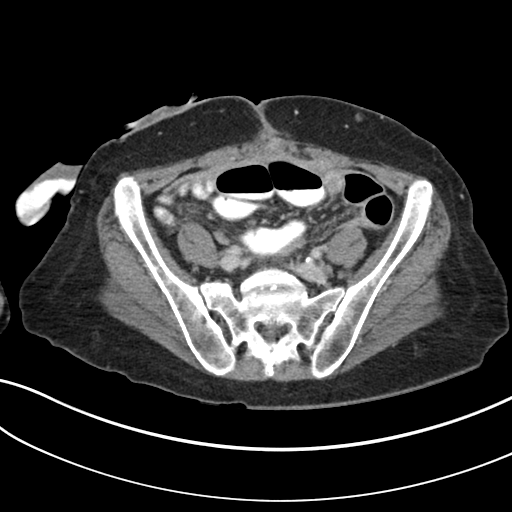
[im 38/75  soft-tissue]
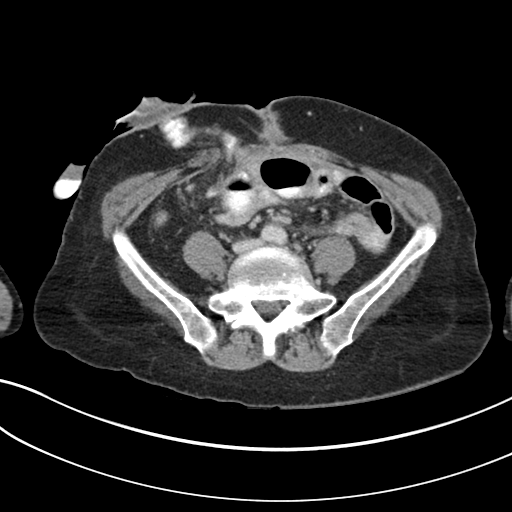
[im 43/75  soft-tissue]
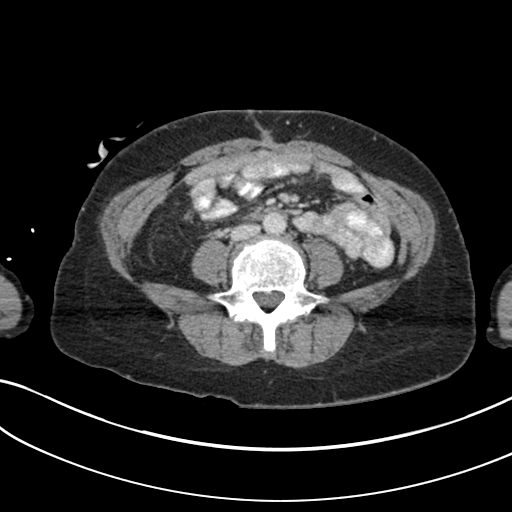
[im 48/75  soft-tissue]
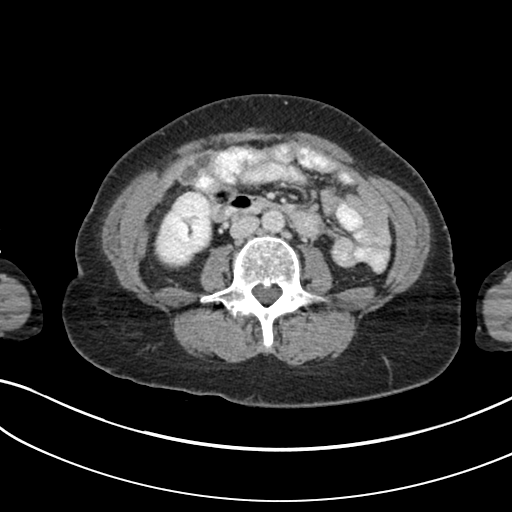
[im 48/75  bone]
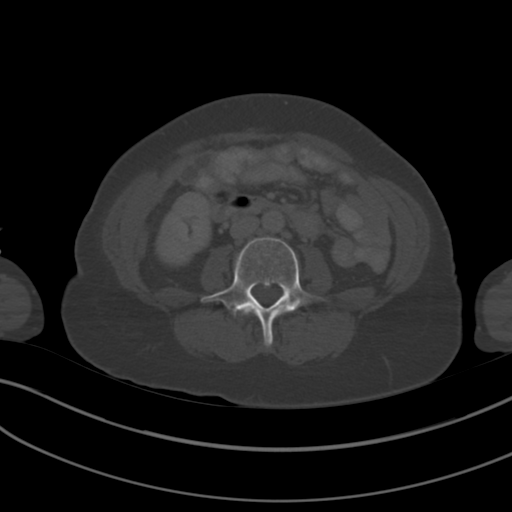
[im 53/75  soft-tissue]
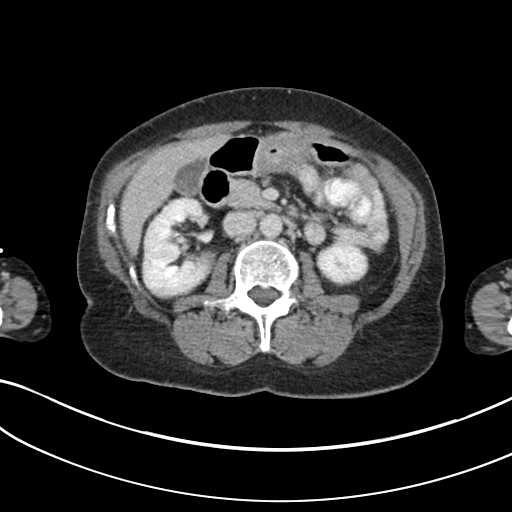
[im 59/75  soft-tissue]
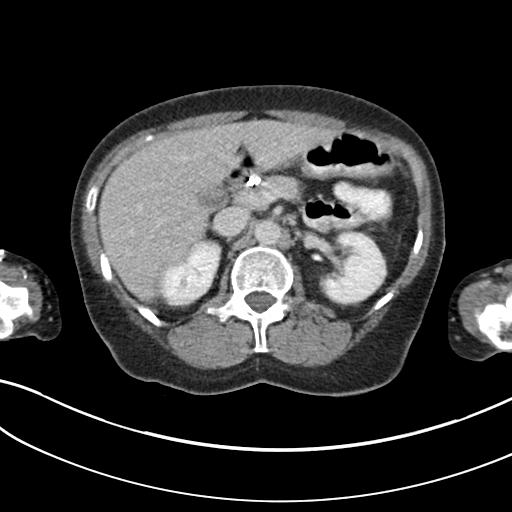
[im 64/75  soft-tissue]
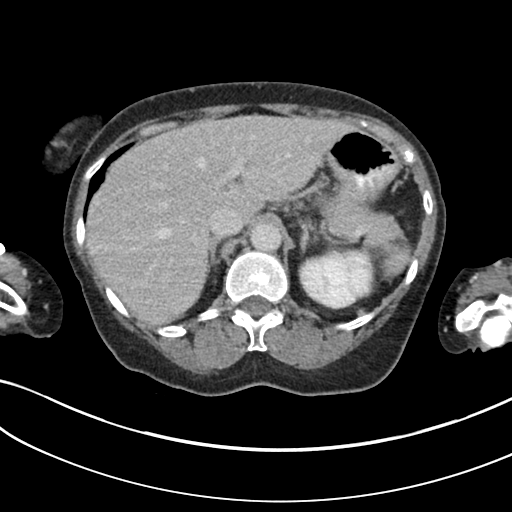
[im 69/75  soft-tissue]
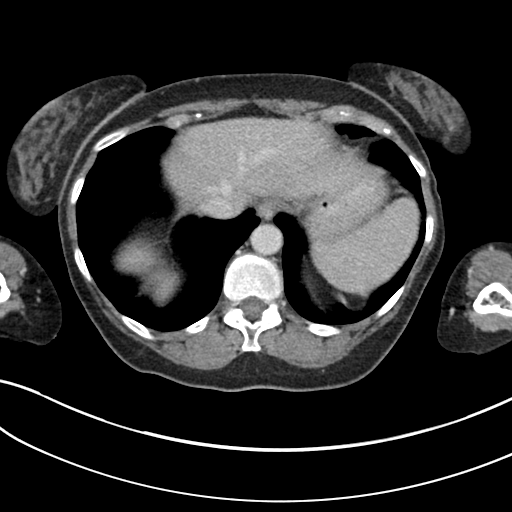

[Series 5: cor · coronal · 0.76mm/px · 3 of 104 slices shown]
[im 35/104  soft-tissue]
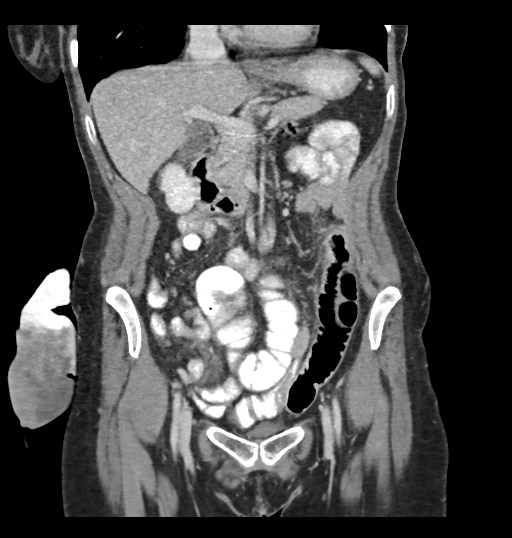
[im 46/104  soft-tissue]
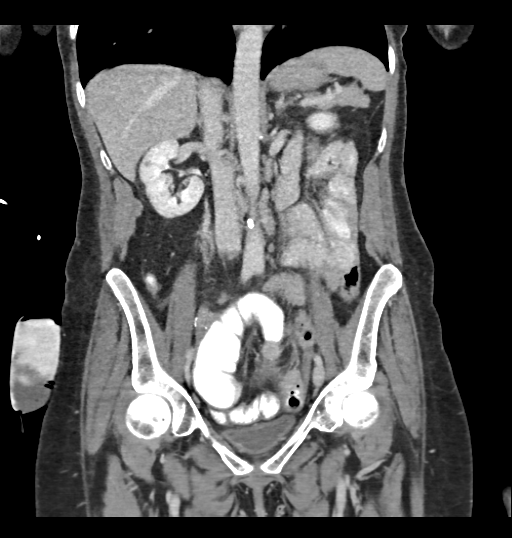
[im 58/104  soft-tissue]
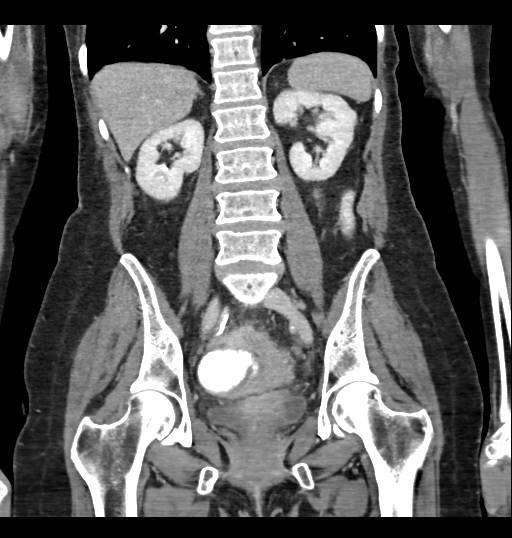

[16 of 46 positions shown; findings below may reference images not displayed]

FINDINGS: Lung bases clear.  Normal heart size.

Unremarkable liver, biliary system, spleen, pancreas, adrenal
glands. Bilateral too small to further characterize renal
hypodensities. Otherwise, symmetric renal enhancement. No
hydroureteronephrosis.

No complete bowel obstruction, with contrast reaching the right
lower quadrant ostomy. Evidence of partial colectomy. Decrease in
size of the fluid collection along the vaginal cuff, though mild
complexity and fluid persists. Adjacent small bowel loop is kinked,
with proximal dilatation up to 3 cm. And there is a thickened loop
within the left lower pelvis on image 56. However, contrast is seen
distal to this loop. No free intraperitoneal air or new fluid
collection. No lymphadenopathy.

Normal caliber aorta and branch vessels with scattered
atherosclerotic disease. Coils within the right upper quadrant.

Partially decompressed bladder. Uterus presumed surgically absent.
No adnexal mass.

No acute osseous finding.
IMPRESSION: Status post partial colectomy with right lower quadrant ileostomy.
There is no complete obstruction at this time however there is
delayed transit through a segment of bowel in the right pelvis,
which may cause partial or intermittent obstruction.

Fluid collection of indeterminate sterility within the pelvis
adjacent to the vaginal cuff, however decreased in size in the
interval.

## 2014-12-15 IMAGING — CR DG CHEST 1V PORT
2 series · 2 of 2 positions shown · non-contrast
Comparison: 03/27/2013

CLINICAL DATA: Weakness, COPD

EXAM:
PORTABLE CHEST - 1 VIEW

[AP (1 of 2)]
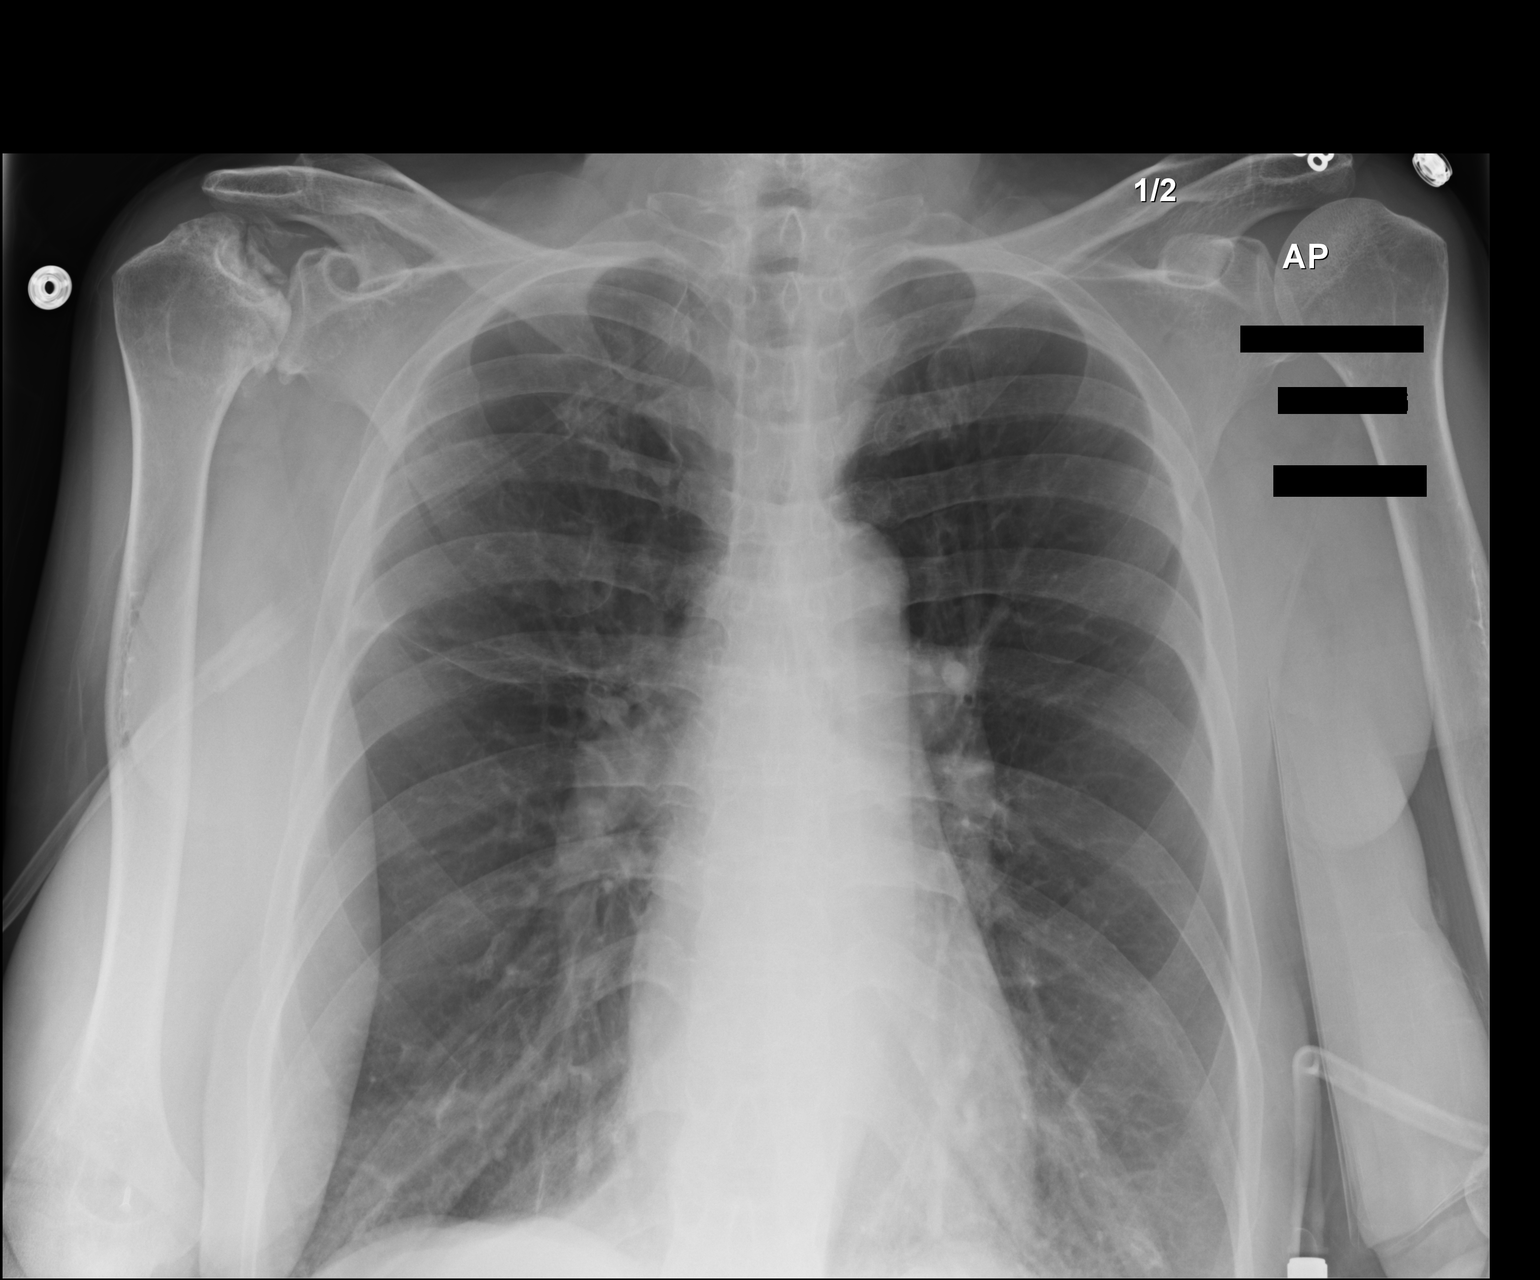

[AP (2 of 2)]
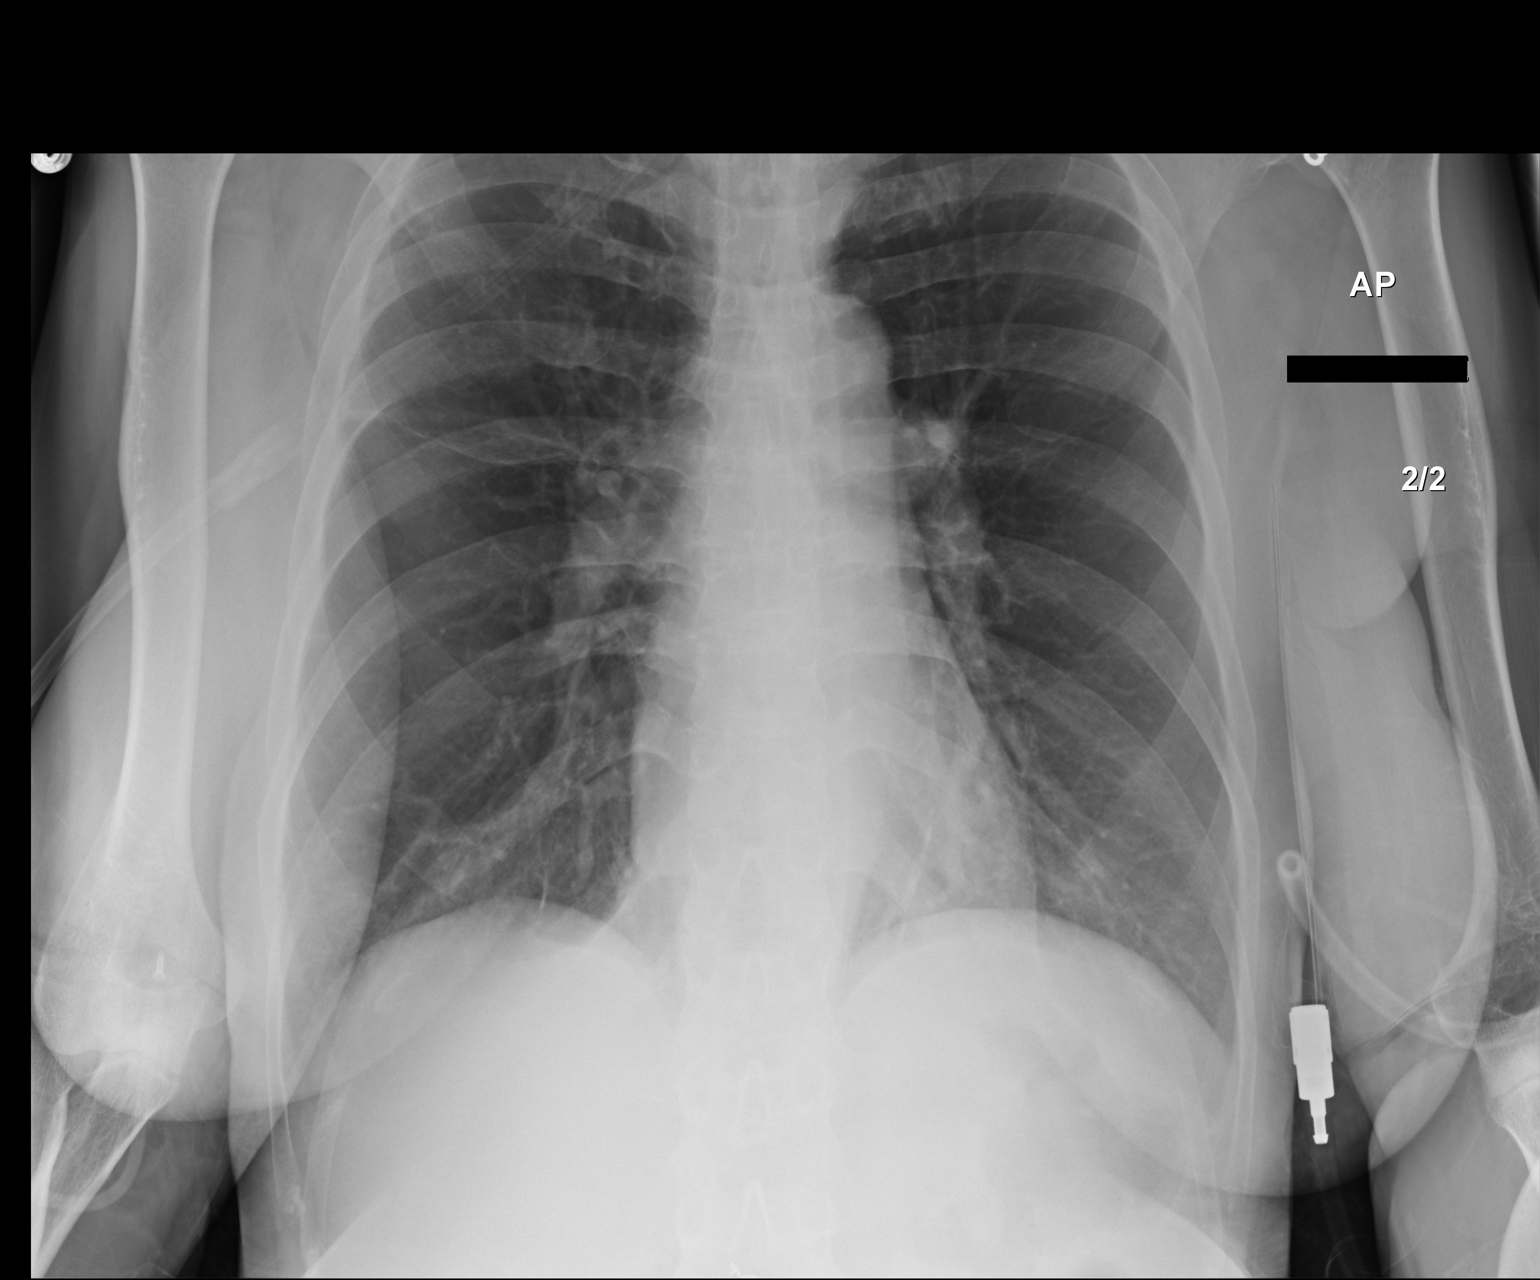

[2 of 2 positions shown; findings below may reference images not displayed]

FINDINGS: Cardiomediastinal silhouette is stable. No acute infiltrate or
pulmonary edema. Extensive degenerative changes right shoulder again
noted. Stable bilateral atelectasis or scarring.
IMPRESSION: No active disease. Extensive degenerative changes right shoulder
again noted.

## 2014-12-16 IMAGING — CR DG ABDOMEN 1V
1 series · 1 of 1 positions shown · non-contrast
Comparison: 04/26/2013

CLINICAL DATA: Small bowel obstruction.

EXAM:
ABDOMEN - 1 VIEW

[t abdomen supine]
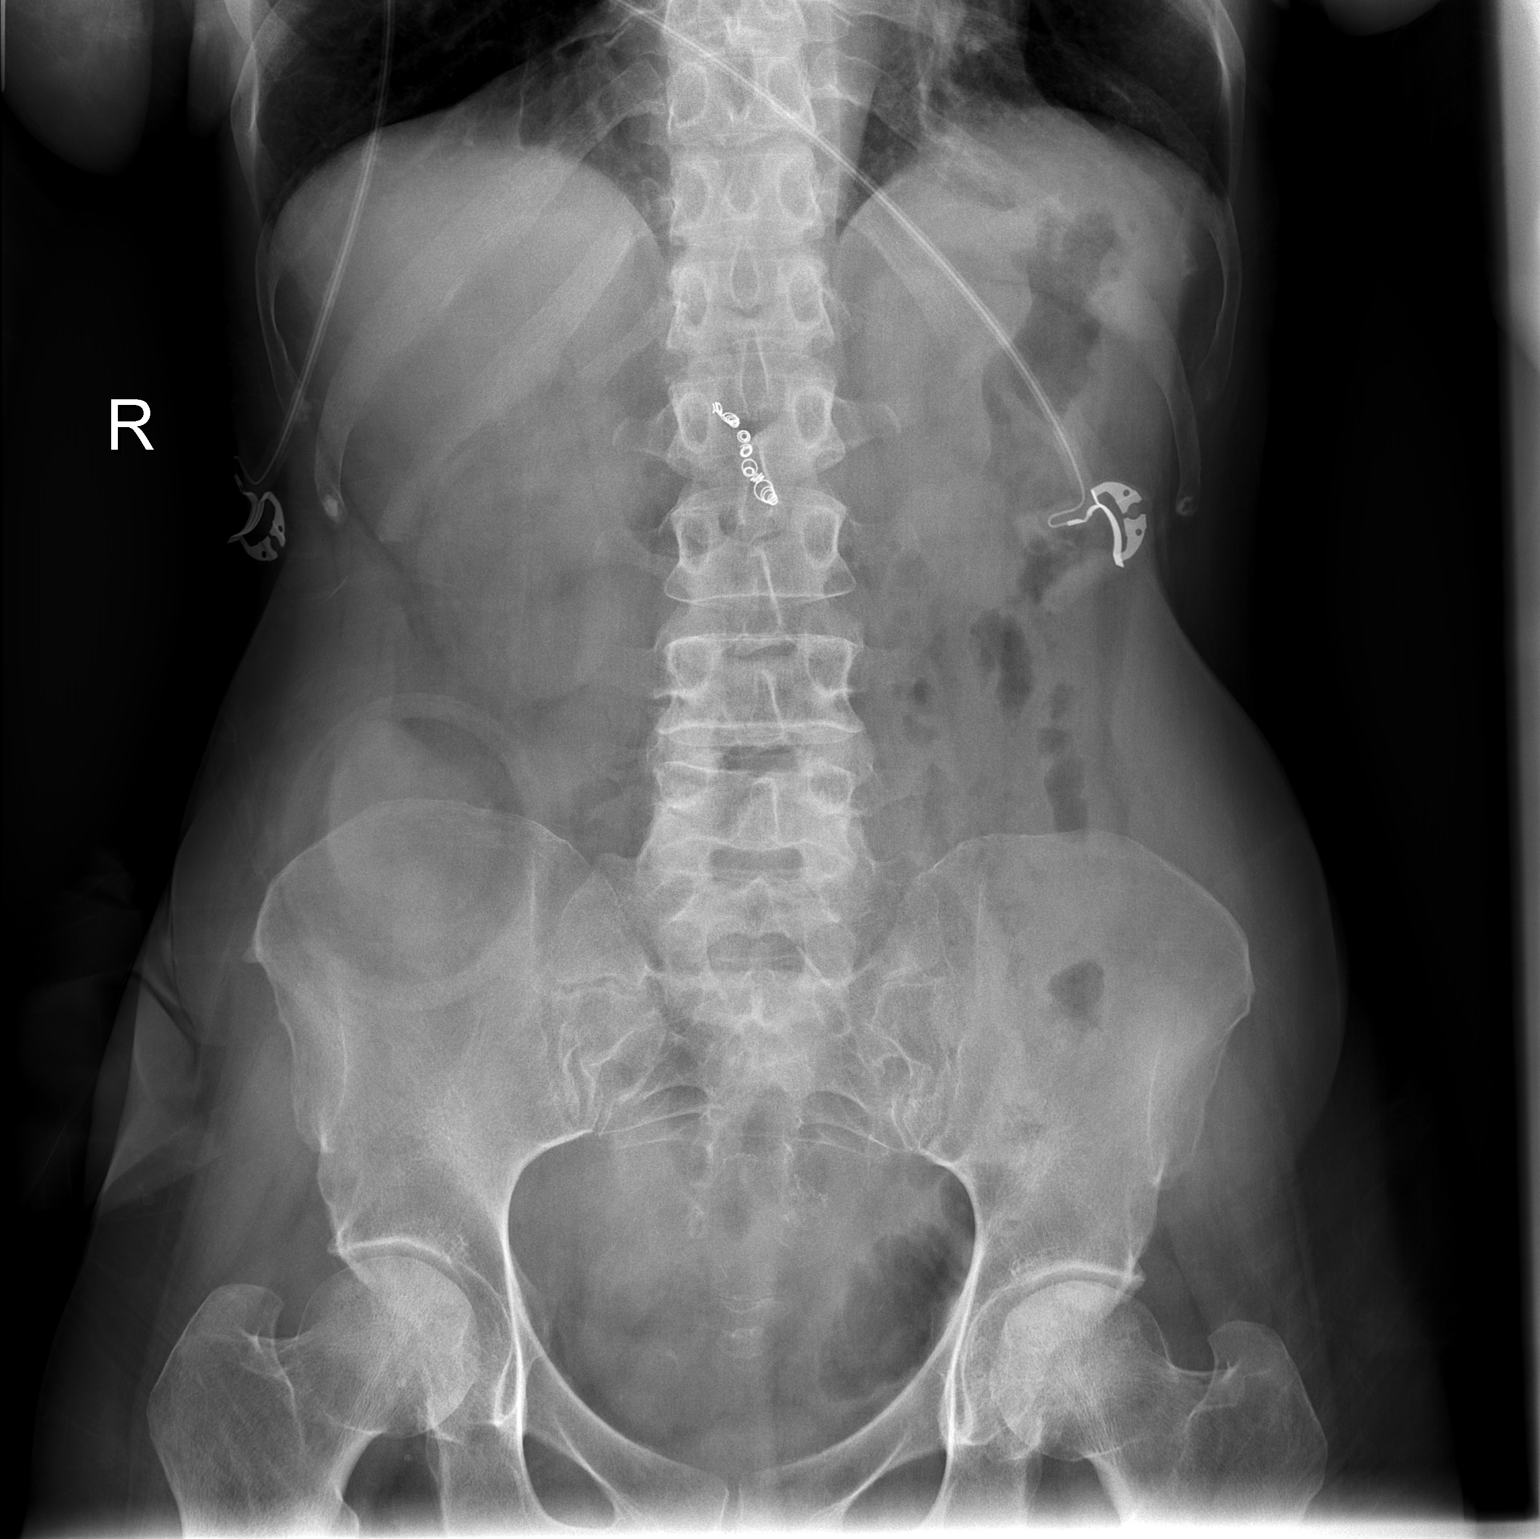

[1 of 1 positions shown; findings below may reference images not displayed]

FINDINGS: Right-sided ostomy noted.  Coils project over the upper abdomen.

No dilated bowel. Much of the right-sided abdominal bowel is
gasless.

Sclerosis in both femoral heads compatible with avascular necrosis.

Linear retrocardiac density is present.
IMPRESSION: 1. No currently dilated bowel to suggest obstruction. A considerable
amount of the right-sided bowel is gasless which can sometimes hide
bowel pathology.
2. Bilateral hip avascular necrosis.
3. Subsegmental atelectasis in the left lower lobe.

## 2014-12-25 IMAGING — CT CT ABD-PELV W/ CM
2 of 5 series · 14 of 46 positions shown, 16 images · IV contrast (APPLIED)
Comparison: Abdominal radiograph performed 04/27/2013, and CT of
the abdomen and pelvis performed 04/26/2013

CLINICAL DATA: Abdominal pain, nausea and vomiting.

EXAM:
CT ABDOMEN AND PELVIS WITH CONTRAST
TECHNIQUE: Multidetector CT imaging of the abdomen and pelvis was performed
using the standard protocol following bolus administration of
intravenous contrast.
CONTRAST:  100mL OMNIPAQUE IOHEXOL 300 MG/ML  SOLN

[Series 2: abd/ pelvis 5.0 i30f 1 · axial · 0.61mm/px · z∈[+784,+1120]mm · 11 of 77 slices shown, 13 images]
[im 5/77  soft-tissue]
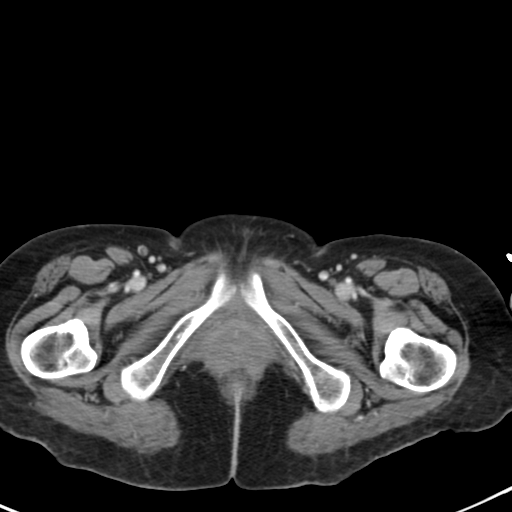
[im 5/77  bone]
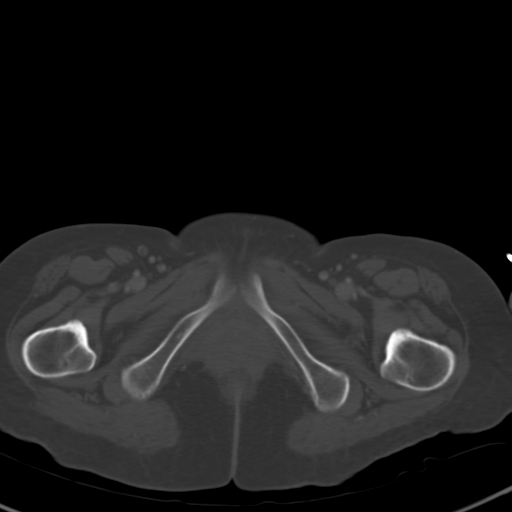
[im 13/77  soft-tissue]
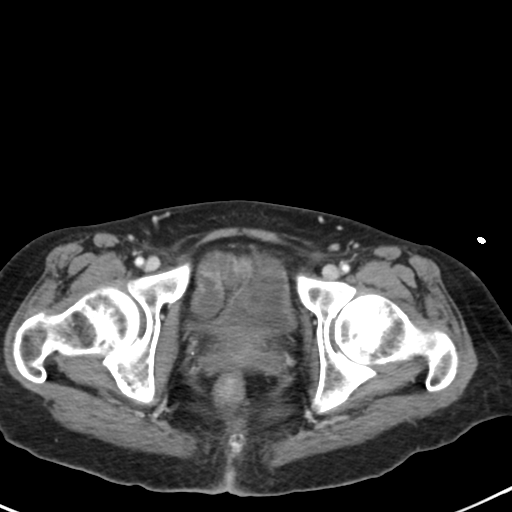
[im 17/77  soft-tissue]
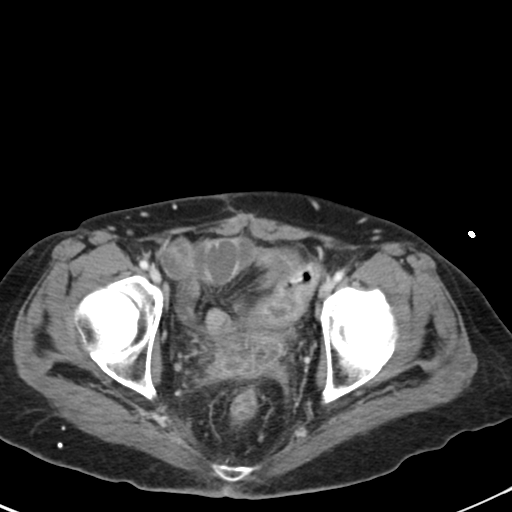
[im 26/77  soft-tissue]
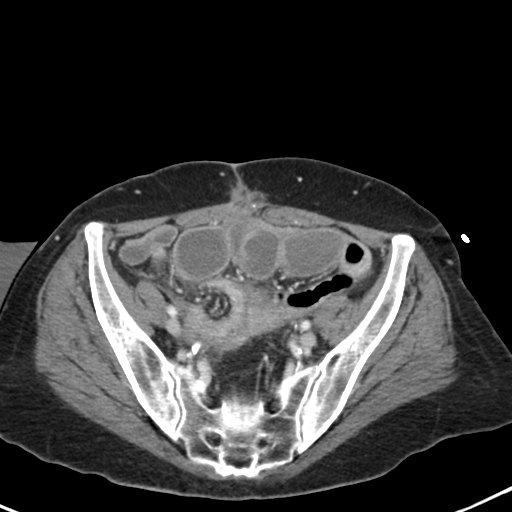
[im 30/77  soft-tissue]
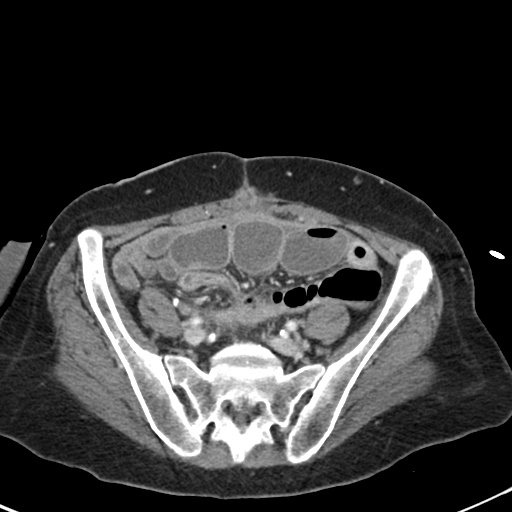
[im 39/77  soft-tissue]
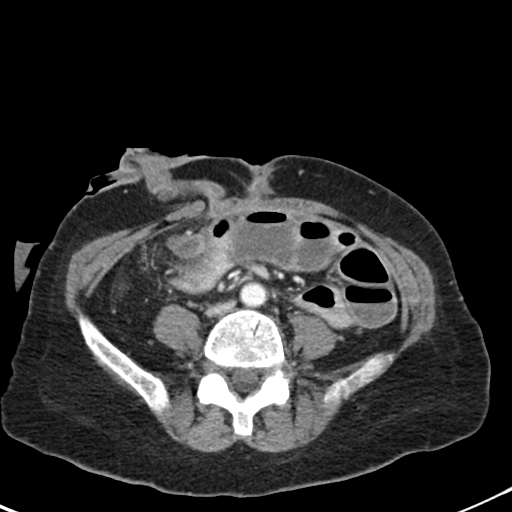
[im 47/77  soft-tissue]
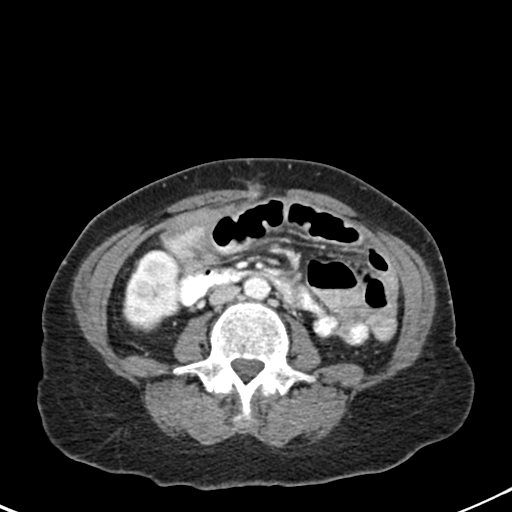
[im 51/77  soft-tissue]
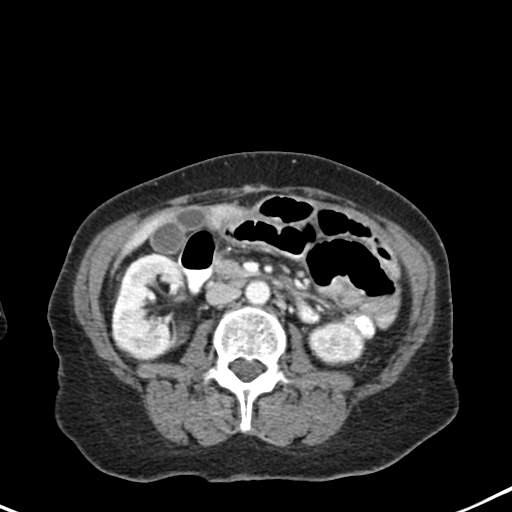
[im 60/77  soft-tissue]
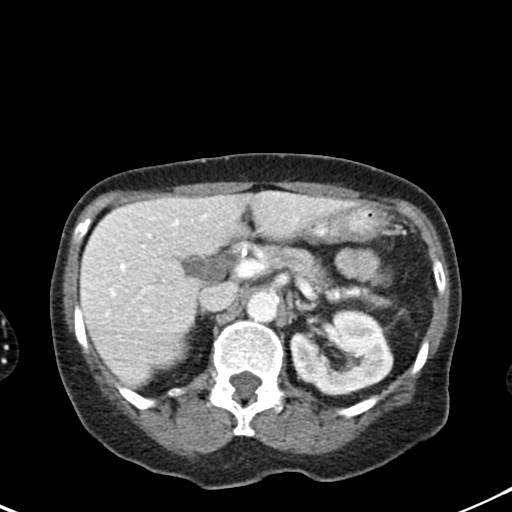
[im 60/77  bone]
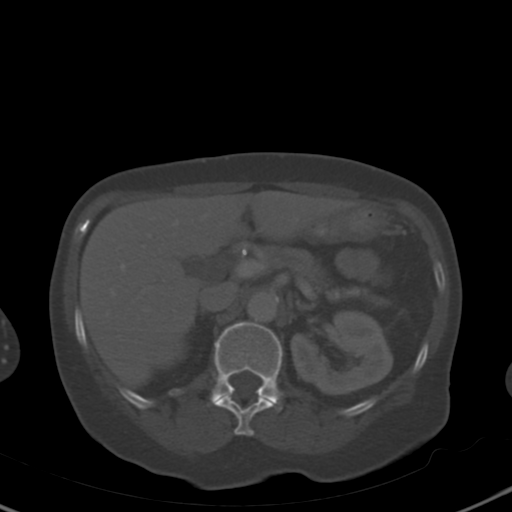
[im 64/77  soft-tissue]
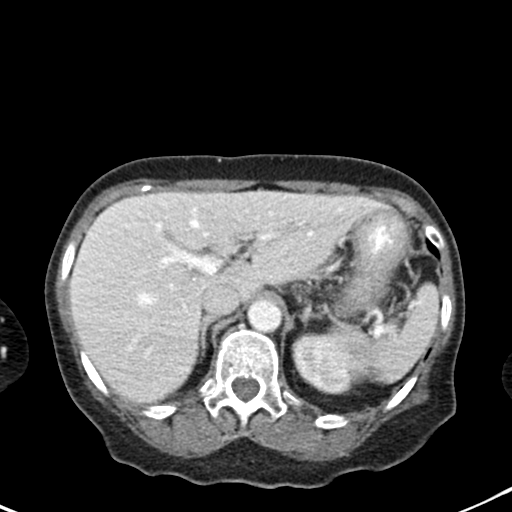
[im 72/77  soft-tissue]
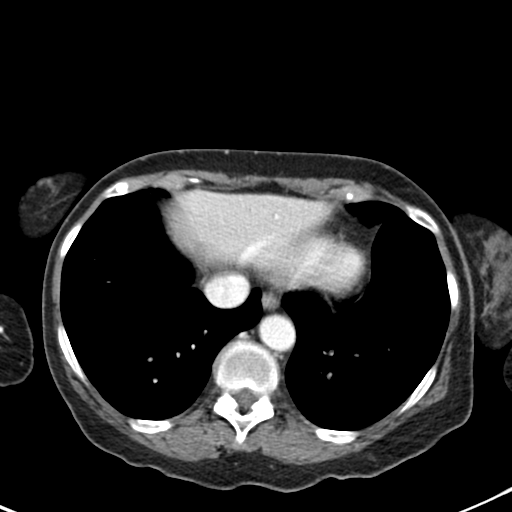

[Series 5: cororal soft tissue · coronal · 0.67mm/px · 3 of 66 slices shown]
[im 22/66  soft-tissue]
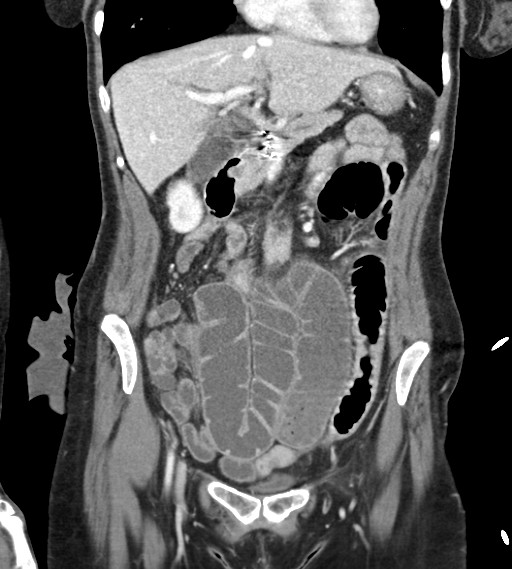
[im 29/66  soft-tissue]
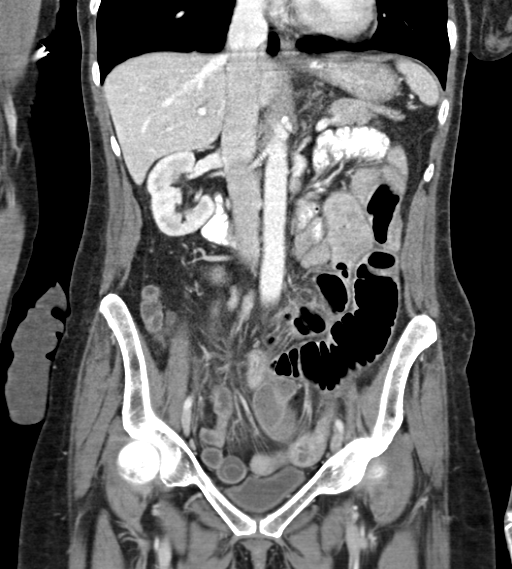
[im 37/66  soft-tissue]
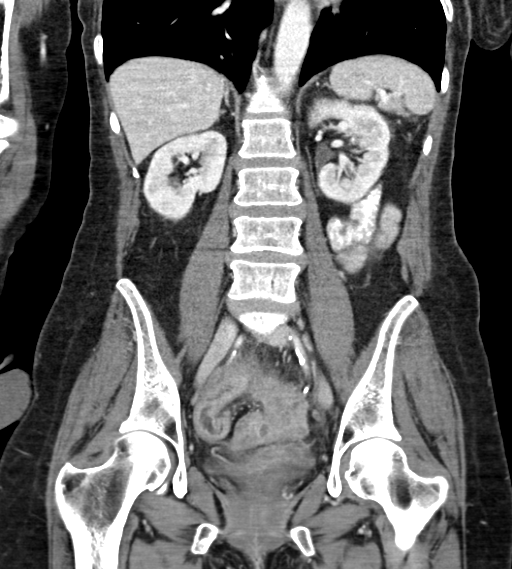

[14 of 46 positions shown; findings below may reference images not displayed]

FINDINGS: The visualized lung bases are clear.

There is mild nonspecific prominence of the intrahepatic biliary
ducts. The gallbladder is mildly distended and somewhat tortuous,
without evidence of significant gallbladder wall thickening or
pericholecystic fluid. The common hepatic duct remains normal in
caliber, measuring 0.6 cm. The liver is otherwise unremarkable in
appearance. The spleen is within normal limits. The pancreas and
adrenal glands are unremarkable. Adjacent postoperative change is
noted, near the proximal body of the pancreas.

The kidneys are unremarkable in appearance. There is no evidence of
hydronephrosis. No renal or ureteral stones are seen. No perinephric
stranding is appreciated.

There is dilatation of a relatively long 50 cm segment of small
bowel at the lower abdomen, measuring up to 4.1 cm in diameter.
There appears to be twisting of the more proximal and distal loops
at the level of the upper pelvis, with significant associated wall
thickening and soft tissue inflammation. This clump of inflamed
small bowel is directly adjacent to the previously noted complex
collection of fluid at the vaginal cuff. The appearance is
suspicious for closed loop obstruction; alternatively, it could
reflect multiple adhesions, or obstruction due to inflammation in
association with an enlarging complex pelvic abscess.

The complex collection of fluid at the vaginal cuff has increased in
size, with multiple septations and peripheral enhancement, measuring
approximately 4.0 x 3.6 x 2.7 cm. A small amount of surrounding free
fluid is seen.

The remaining visualized small bowel is grossly unremarkable in
appearance, with relative decompression of distal small bowel loops.
The presence of trace fluid within the distal small bowel suggests
that the bowel obstruction is incomplete, increasing the likelihood
of obstruction due to pelvic inflammation. The right lower quadrant
ileostomy is unremarkable in appearance.

The stomach is within normal limits. No acute vascular abnormalities
are seen. Minimal calcification is noted along the abdominal aorta
and its branches.

The patient's Hartmann's pouch is decompressed and unremarkable in
appearance.

The bladder is decompressed and grossly unremarkable in appearance,
though difficult to fully assess. No pelvic sidewall lymphadenopathy
is identified. No inguinal lymphadenopathy is seen.

No acute osseous abnormalities are identified.
IMPRESSION: 1. Dilatation of relatively long 50 cm segment of small bowel at the
lower abdomen, to 4.1 cm in maximal diameter. There is twisting of
the more proximal and distal loops at the level of the upper pelvis,
with significant associated ileal wall thickening and soft tissue
inflammation. The clump of inflamed small bowel is directly adjacent
to the previously noted complex collection of fluid at the vaginal
cuff. The complex collection of fluid has increased in size,
measuring 4.0 x 3.6 x 2.7 cm, with multiple septations and
peripheral enhancement. The appearance is suspicious for closed loop
obstruction; alternatively, it could reflect multiple adhesions, or
obstruction due to inflammation in association with an enlarging
complex pelvic abscess. Trace residual fluid within the more distal
small bowel favors not-quite-complete obstruction due to focal
pelvic inflammation.
2. Mild nonspecific prominence of the intrahepatic biliary ducts,
with mild gallbladder distention. No evidence of distal obstruction.
3. Small amount of free fluid noted within the pelvis.

These results were called by telephone at the time of interpretation
on 05/06/2013 at [DATE] to LESYA AUJLA PA, who verbally
acknowledged these results.

## 2014-12-26 IMAGING — CR DG CHEST 1V PORT
1 series · 1 of 1 positions shown · non-contrast
Comparison: 04/26/2013

CLINICAL DATA: PICC line placement

EXAM:
PORTABLE CHEST - 1 VIEW

[AP]
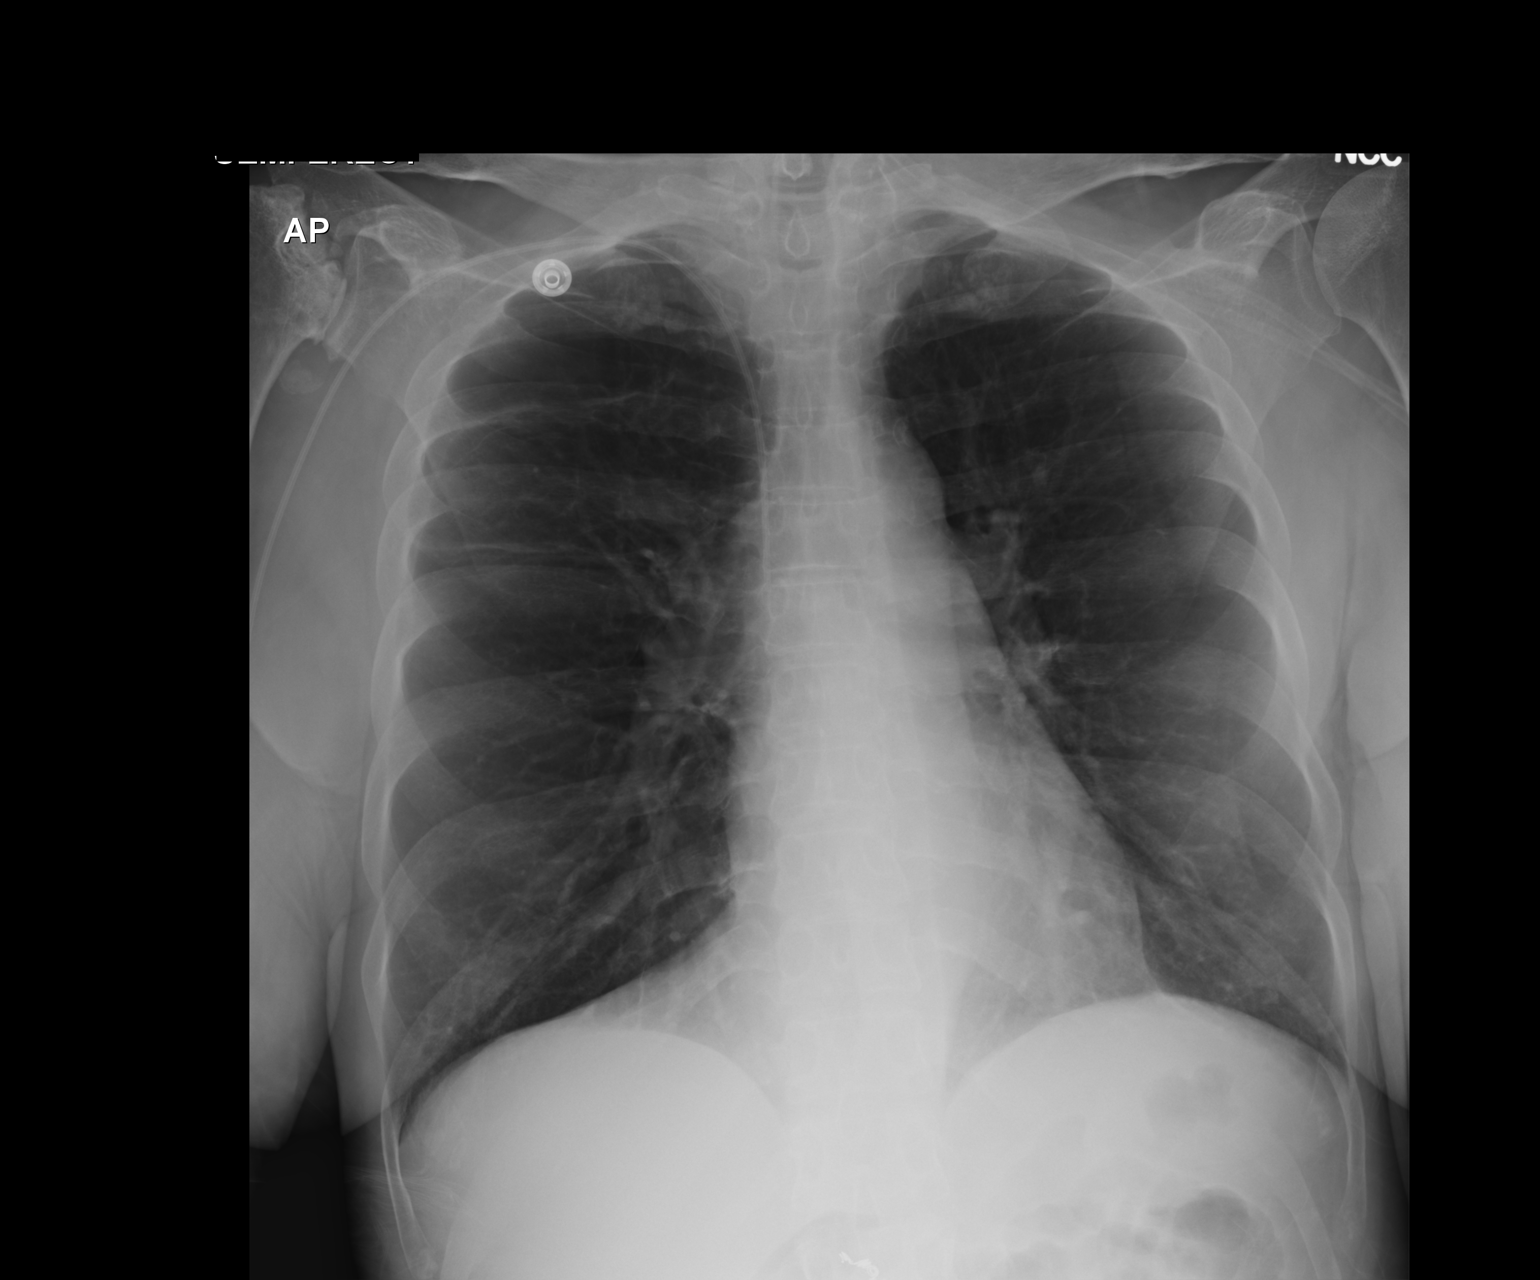

[1 of 1 positions shown; findings below may reference images not displayed]

FINDINGS: A right-sided PICC line is appreciated with tip projecting in the
region of the superior vena cava. There is no evidence of a
pneumothorax. The lungs are hyperinflated. Emphysematous changes
identified within the lung apices. No focal region of consolidation
or focal infiltrates. Cardiac silhouette is within normal limits.
Chronic changes identified within the the right humeral head.
IMPRESSION: PICC line as described above.  COPD

## 2014-12-28 IMAGING — CR DG UGI W/ SMALL BOWEL
2 series · 2 of 2 positions shown · non-contrast
Comparison: CT scan dated 05/06/2013 and upper GI with small-bowel
follow-through dated 10/13/2009

FLUOROSCOPY TIME:  1 min 44 seconds

CLINICAL DATA: Abdominal pain.  Crohn's disease.  Ileostomy.

EXAM:
UPPER GI SERIES WITH SMALL BOWEL FOLLOW-THROUGH
TECHNIQUE: Combined double contrast and single contrast upper GI series using
effervescent crystals, thick barium, and thin barium. Subsequently,
serial images of the small bowel were obtained including spot views
of the small bowel.

[view not recorded (1 of 2)]
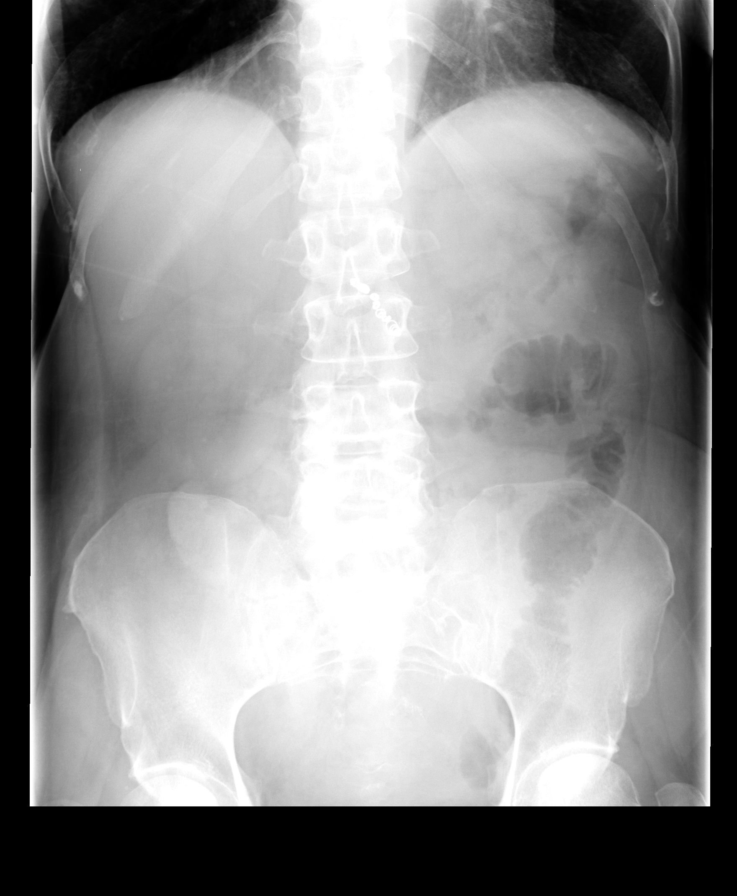

[view not recorded (2 of 2)]
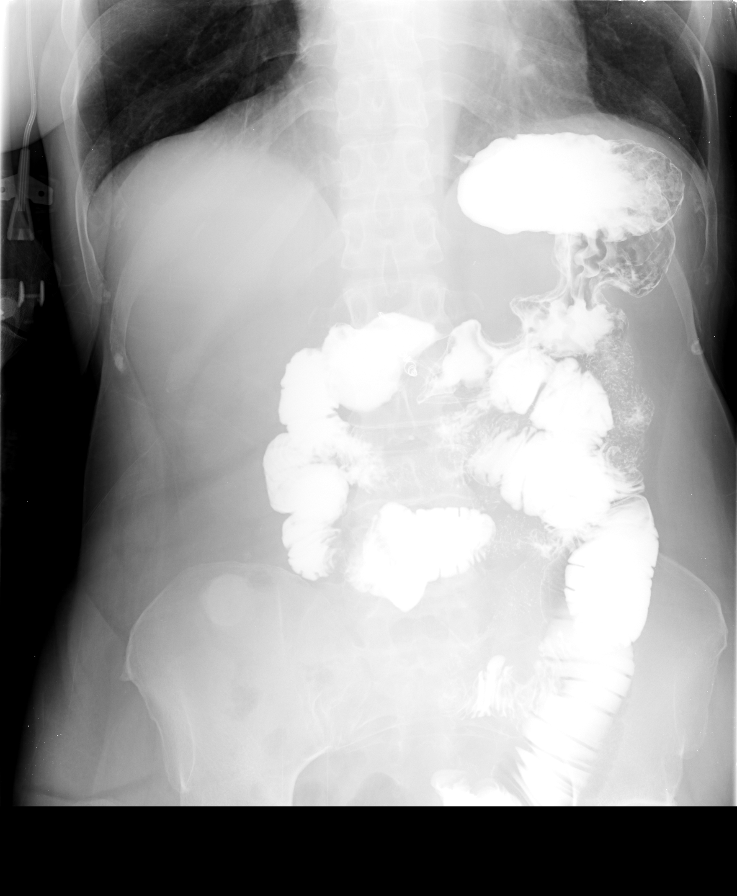

[2 of 2 positions shown; findings below may reference images not displayed]

FINDINGS: Vascular coils are seen in the mid abdomen. There dilated loops of
small bowel seen in the left side of the abdomen on the scout image.
The patient slowly sipped on barium. The esophagus appears normal.
The stomach and pylorus and duodenal bulb appear normal. C-loop
appears normal.

Small bowel transit time was 2 hr.

There are multiple dilated loops of small bowel in the left mid
abdomen and in the central portion of the pelvis with tethering of
these bowel loops. The distal ileum appears normal. Ileostomy
appears normal. No definable fistulae. The transition point is
visible on the 45 min image of the abdomen.

I do not see an area low in the pelvis that correlates with the
inhomogeneous area adjacent to the top of the vagina and the stump
of distal colon visible on the recent CT scan. I suspect this could
represent an abscess.
IMPRESSION: 1. Partial small bowel obstruction. The transition point is in the
mid pelvis.
2. I suspect that the inhomogeneous area in the central portion of
the pelvis on the prior CT scan is a multi-septated abscess rather
than clumped small bowel.

## 2015-02-09 IMAGING — CT CT ABD-PELV W/ CM
2 of 5 series · 15 of 46 positions shown, 17 images · IV contrast (APPLIED)
Comparison: CT of the abdomen and pelvis 05/06/2013.

CLINICAL DATA: Upper abdominal pain.

EXAM:
CT ABDOMEN AND PELVIS WITH CONTRAST
TECHNIQUE: Multidetector CT imaging of the abdomen and pelvis was performed
using the standard protocol following bolus administration of
intravenous contrast.
CONTRAST:  100mL OMNIPAQUE IOHEXOL 300 MG/ML  SOLN

[Series 2: abd/ pelvis 5.0 i30f 1 · axial · 0.56mm/px · z∈[-499,-139]mm · 12 of 80 slices shown, 14 images]
[im 4/80  soft-tissue]
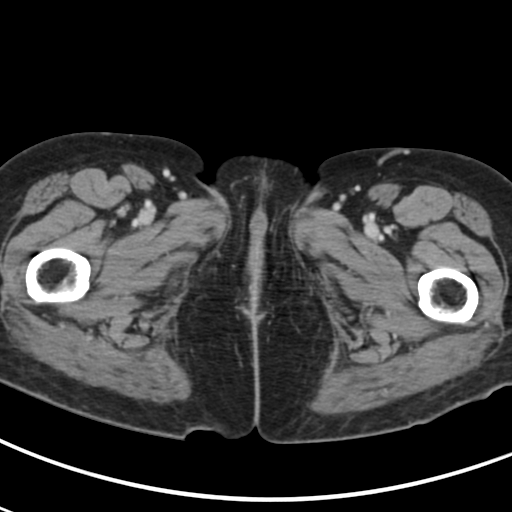
[im 4/80  bone]
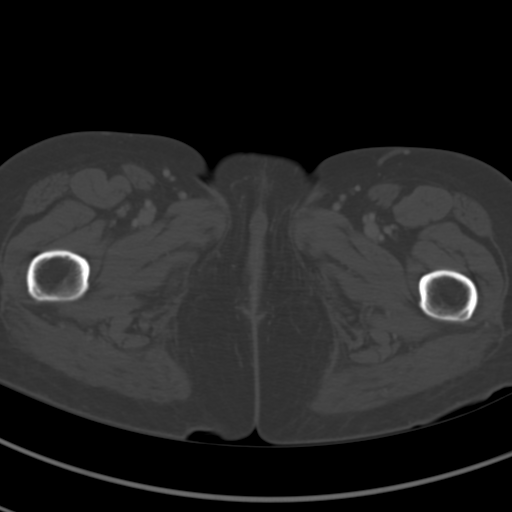
[im 12/80  soft-tissue]
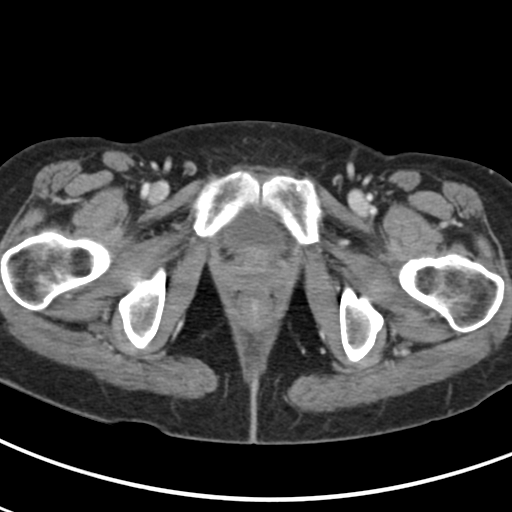
[im 19/80  soft-tissue]
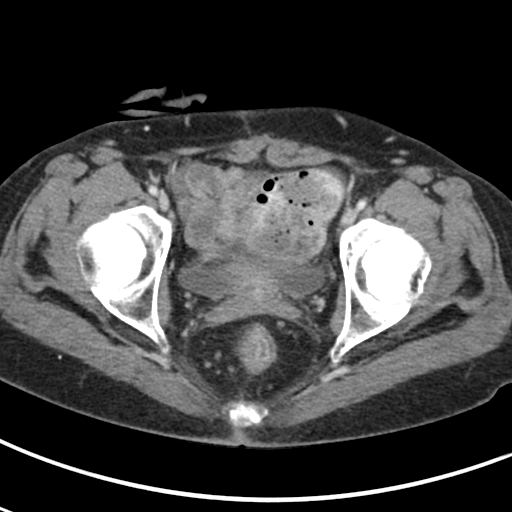
[im 23/80  soft-tissue]
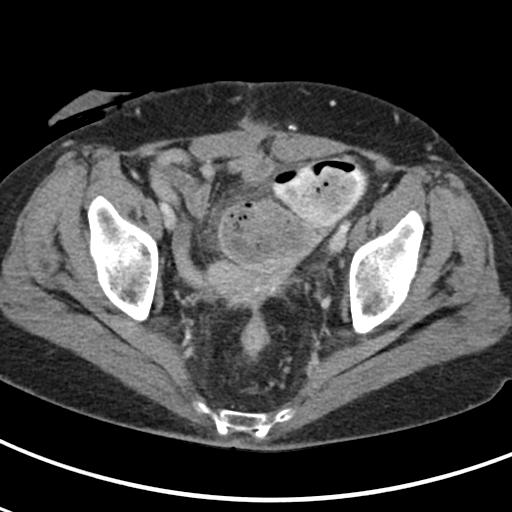
[im 31/80  soft-tissue]
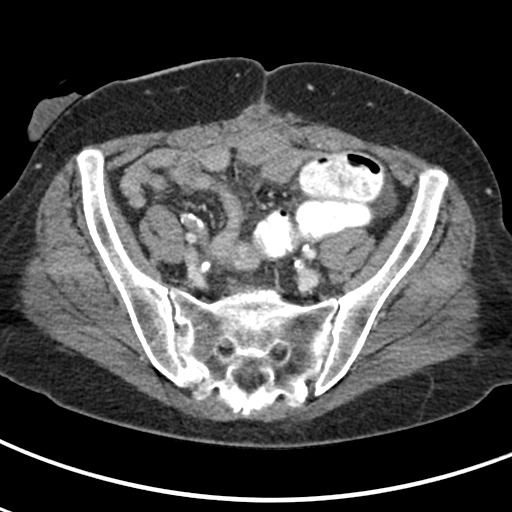
[im 38/80  soft-tissue]
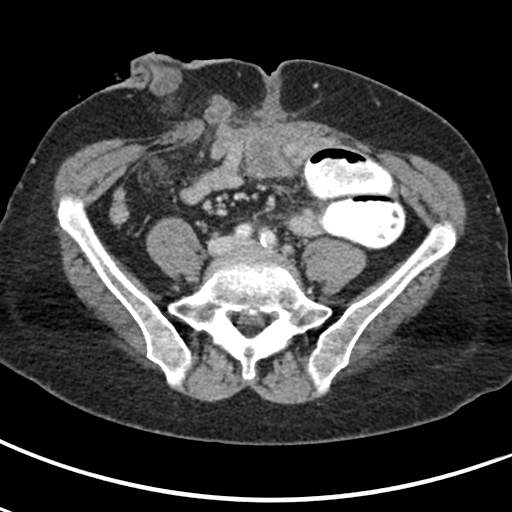
[im 42/80  soft-tissue]
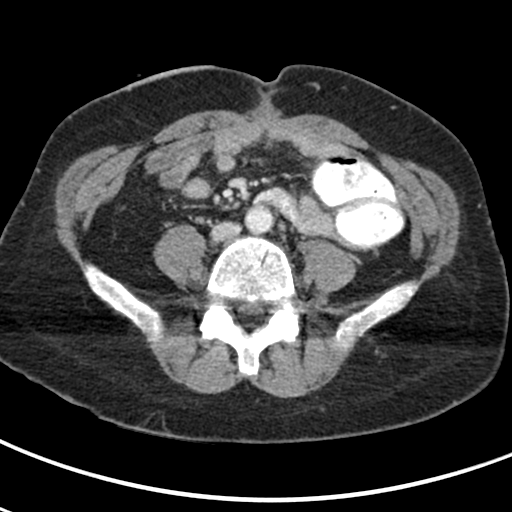
[im 49/80  soft-tissue]
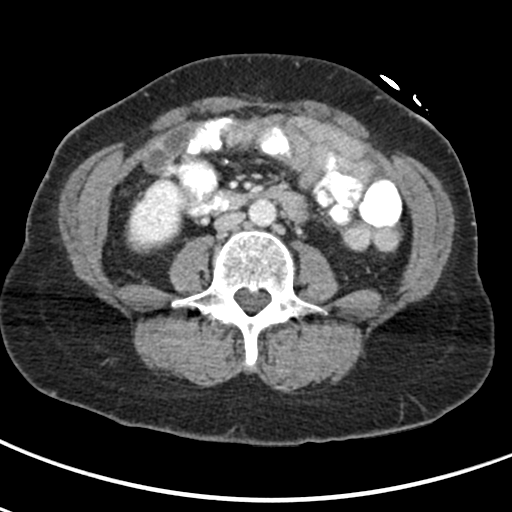
[im 57/80  soft-tissue]
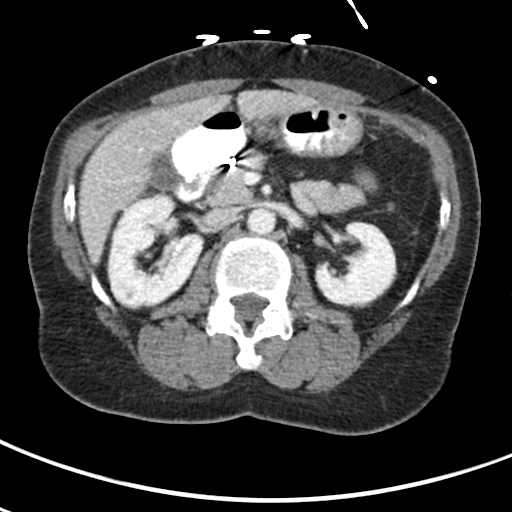
[im 57/80  bone]
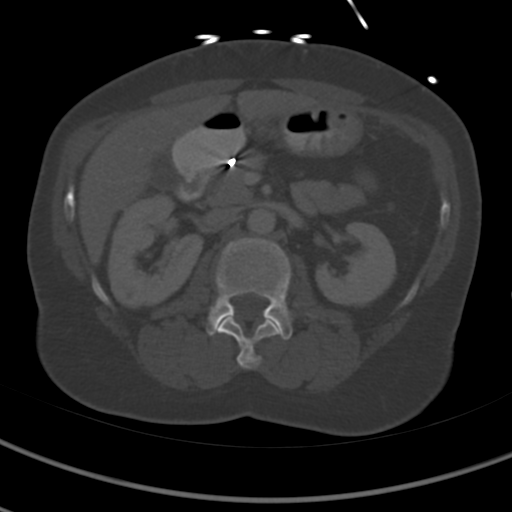
[im 61/80  soft-tissue]
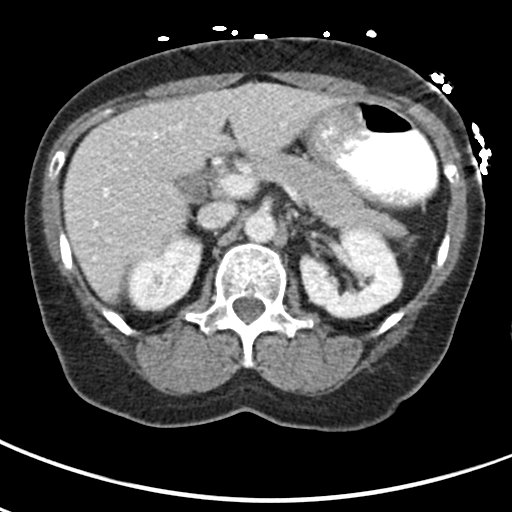
[im 68/80  soft-tissue]
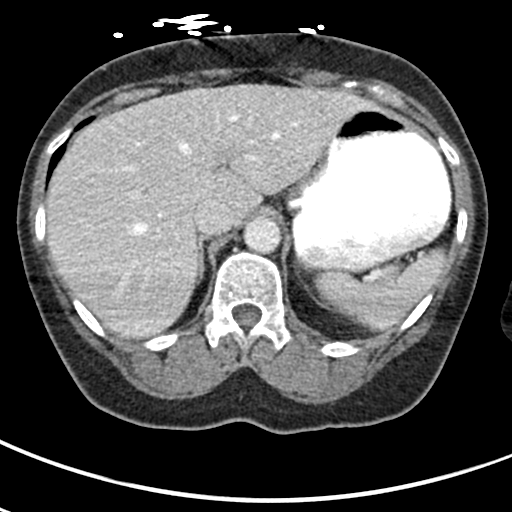
[im 76/80  soft-tissue]
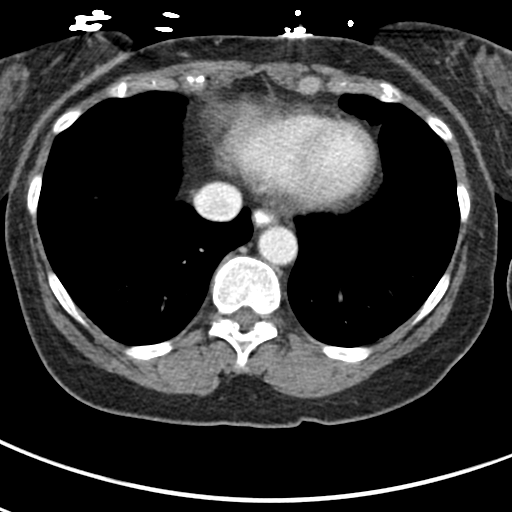

[Series 5: coronal soft tissue · coronal · 0.60mm/px · 3 of 68 slices shown]
[im 23/68  soft-tissue]
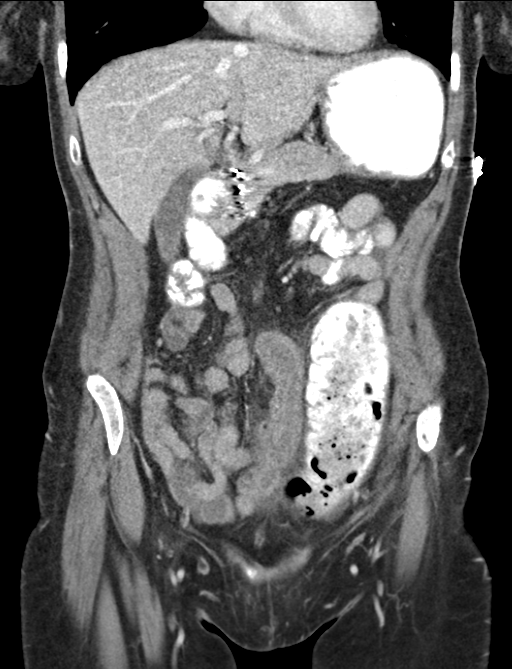
[im 30/68  soft-tissue]
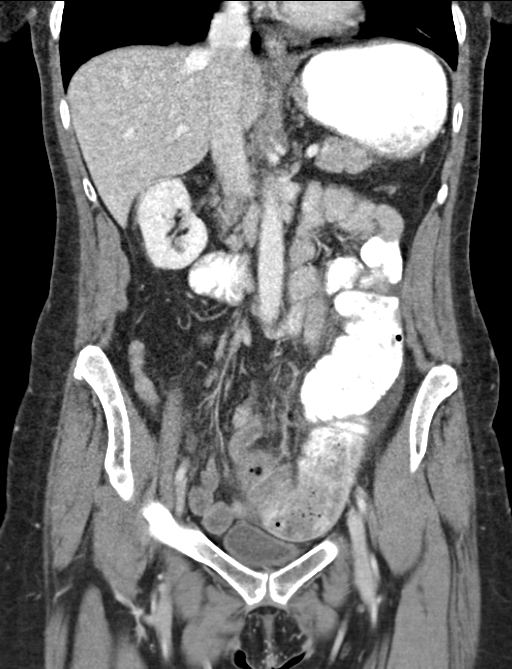
[im 38/68  soft-tissue]
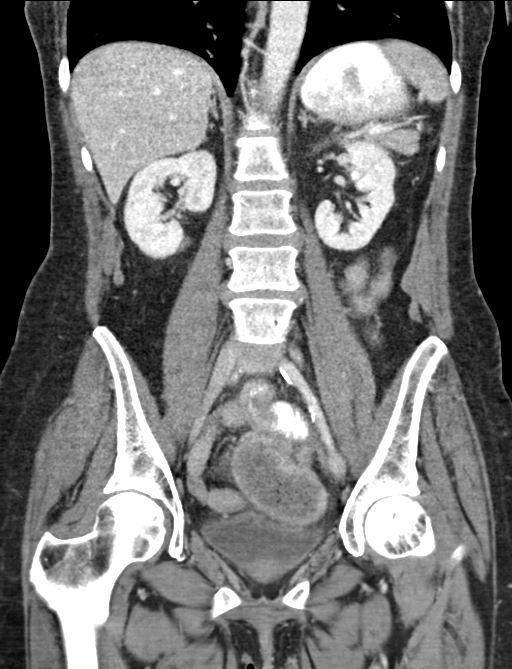

[15 of 46 positions shown; findings below may reference images not displayed]

FINDINGS: Lung Bases: Unremarkable.

Abdomen/Pelvis: The appearance of the liver, gallbladder, pancreas,
spleen, bilateral adrenal glands and right kidney is unremarkable.
Several sub cm low-attenuation lesions are noted in the left kidney,
similar to the prior study; although too small to definitively
characterize, these are favored to represent tiny cysts. High
density material posterior to the pre-pyloric gastric antrum,
presumably embolization coils.

Postoperative changes of total colectomy with right lower quadrant
ileostomy and Hartmann's pouch are again noted. There are several
areas acute angulation of small bowel, best demonstrated on image 55
of series 2 adjacent to the dilated loop, where there is an acutely
angulated loop of bowel, presumably related to underlying adhesions.
There is a small amount a haziness in the small bowel mesentery.
Trace volume of ascites. No pneumoperitoneum. No definite
lymphadenopathy identified within the abdomen or pelvis. Status post
hysterectomy. Previously noted lesion associated with the vaginal
cuff is less cystic in appearance and smaller, currently measuring
2.7 x 1.6 cm (image 60 of series 2), presumably some involuting
postoperative scar tissue. This does appear intimately associated
with multiple adjacent loops of small bowel, suggesting the presence
of adhesions adjacent to the vaginal cuff. Urinary bladder is
unremarkable in appearance.

Musculoskeletal: There are no aggressive appearing lytic or blastic
lesions noted in the visualized portions of the skeleton.
IMPRESSION: 1. Status post total colectomy with right lower quadrant ileostomy
and Hartmann's pouch, with recurrent dilatation of mid to distal
small bowel, presumably related to adhesions. There is fecalization
of small bowel contents in this region. Despite the presence of
dilated loops of small bowel, the more proximal portion of the small
bowel does not appear dilated, suggesting against frank obstruction
at this time.
2. Small amount of edema in the small bowel mesentery and trace
volume of ascites may suggest some active inflammation in this
patient with history of Crohn's disease.
3. No pneumoperitoneum.
4. Additional incidental findings, as above, similar prior studies.

## 2015-02-10 IMAGING — DX DG CHEST 1V PORT
1 series · 1 of 1 positions shown · non-contrast
Comparison: DG CHEST 1V PORT dated 05/07/2013

CLINICAL DATA: Chest pain and dyspnea.

EXAM:
PORTABLE CHEST - 1 VIEW

[portable]
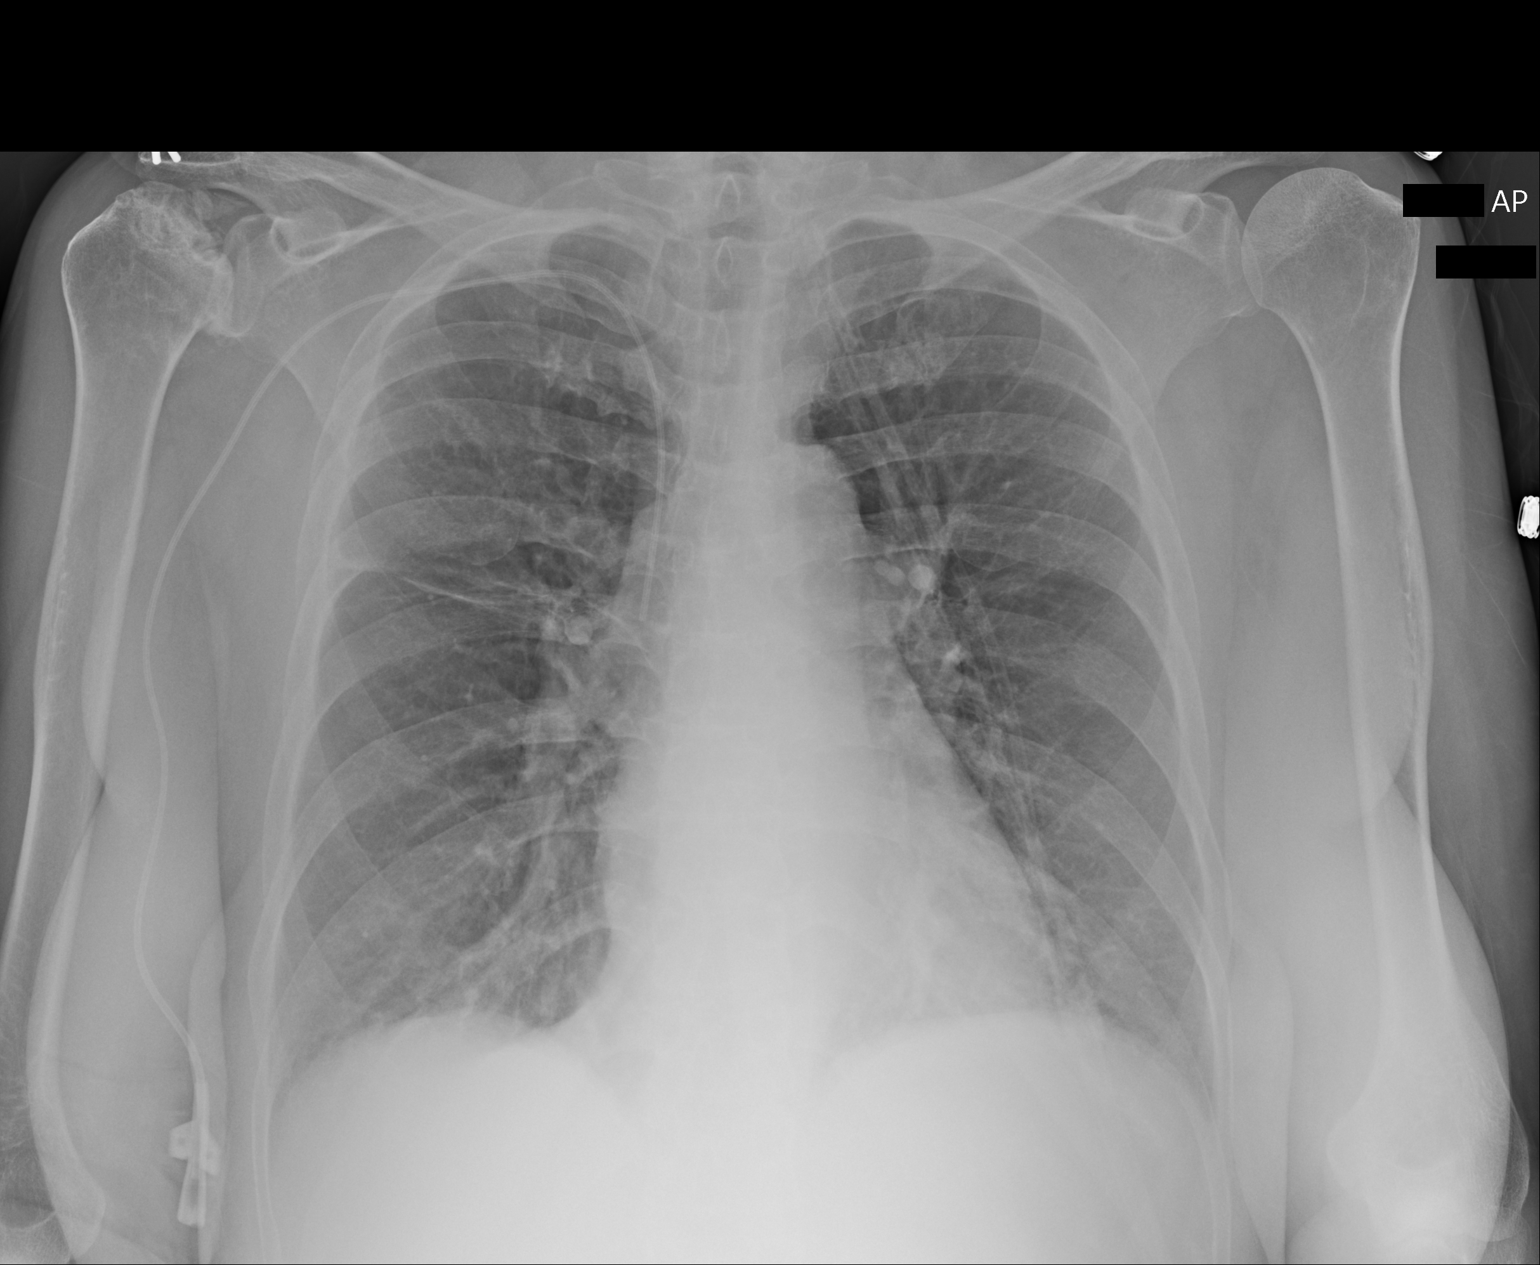

[1 of 1 positions shown; findings below may reference images not displayed]

FINDINGS: DG CHEST 1V PORT dated [REDACTED]e lungs are hyperinflated. The
interstitial markings are increased bilaterally. The cardiac
silhouette is not enlarged. The pulmonary vascularity is mildly
prominent centrally. There is no pleural effusion or pneumothorax.
The right subclavian venous catheter tip lies in the region of the
midportion of the SVC. There is severe degenerative change of the
right shoulder which is stable.
IMPRESSION: 1. Increased interstitial markings bilaterally have developed that
suggests interstitial edema. This may be of cardiac or noncardiac
cause. There is no focal pneumonia.
2. Hyperinflation is stable consistent with COPD.

## 2015-02-10 IMAGING — CR DG ABDOMEN 1V
1 series · 1 of 1 positions shown · non-contrast
Comparison: CT abdomen and pelvis 06/21/2013

CLINICAL DATA: Followup small bowel obstruction

EXAM:
ABDOMEN - 1 VIEW

[t abdomen supine]
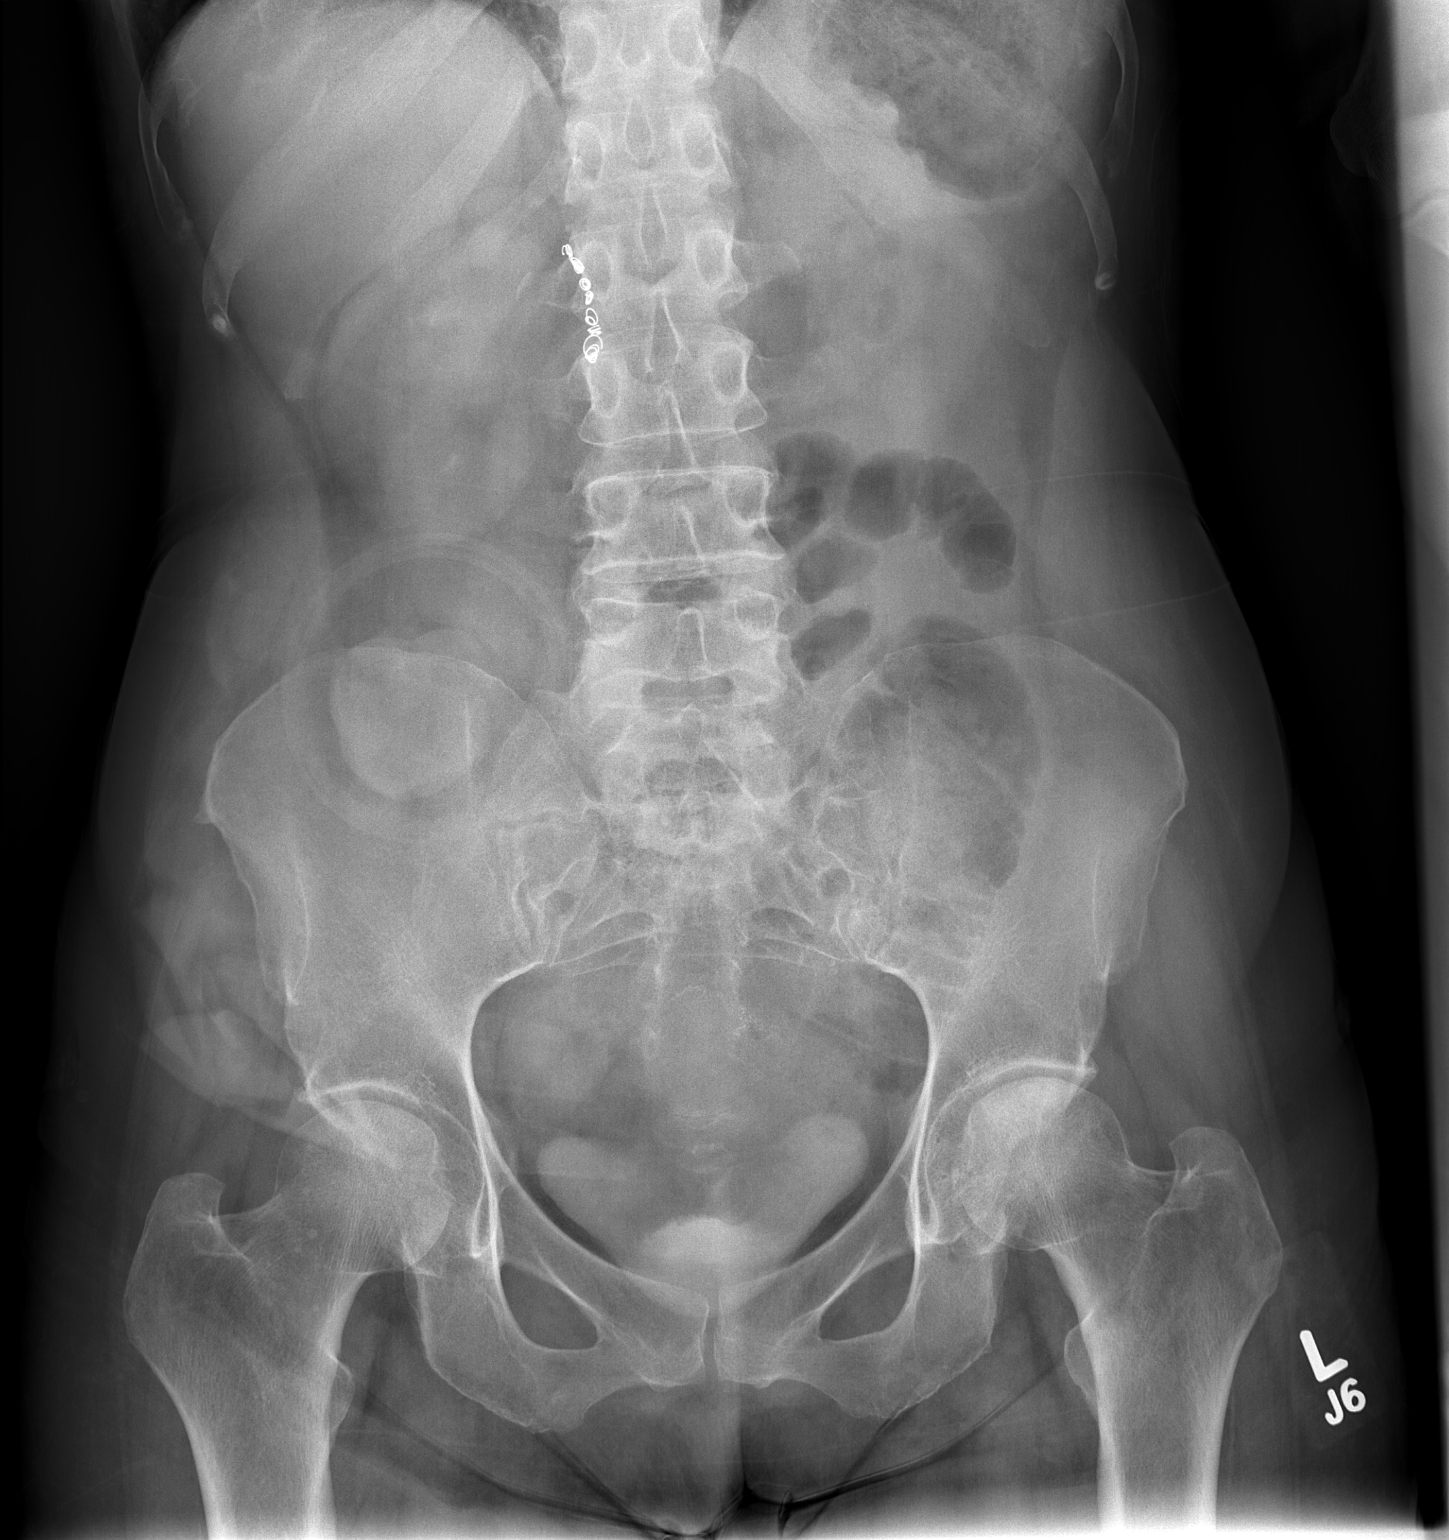

[1 of 1 positions shown; findings below may reference images not displayed]

FINDINGS: Right lower quadrant ostomy.

Excreted contrast material within bladder and minimally in right
renal collecting system.

Single dilated loop of small bowel in the left pelvis consistent
with small bowel obstruction.

No definite bowel wall thickening seen.

Endovascular coils noted right paraspinal at L1-L2.

Osseous structures unremarkable.
IMPRESSION: Persistent dilatation of a small bowel loop in the left pelvis
consistent with obstruction.

## 2015-02-11 IMAGING — CR DG ABDOMEN 2V
2 series · 2 of 2 positions shown · non-contrast
Comparison: Abdominal radiograph dated 06/22/2013. CT abdomen
pelvis dated 06/21/2013.

CLINICAL DATA: Mid abdominal pain, small bowel obstruction

EXAM:
ABDOMEN - 2 VIEW

[w abdomen upright]
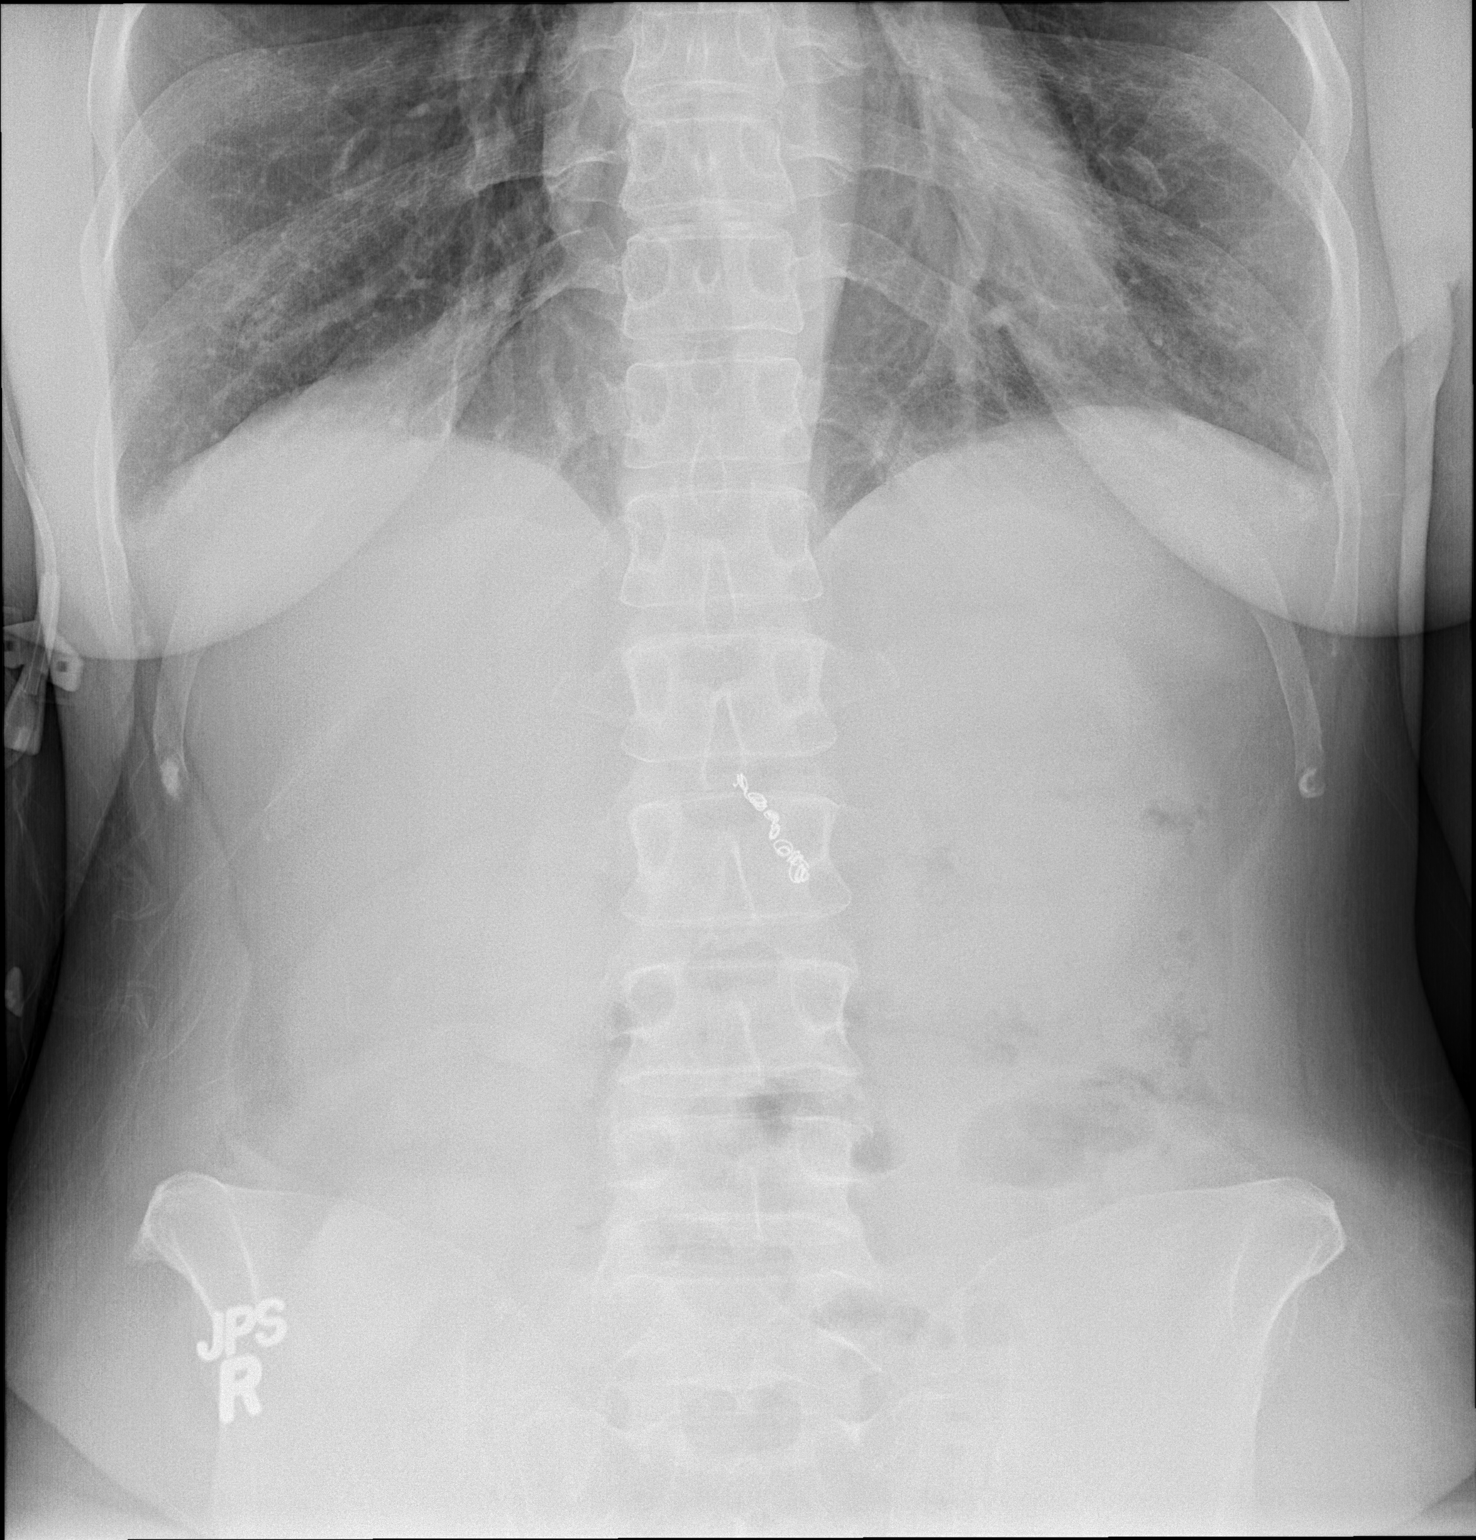

[t abdomen supine]
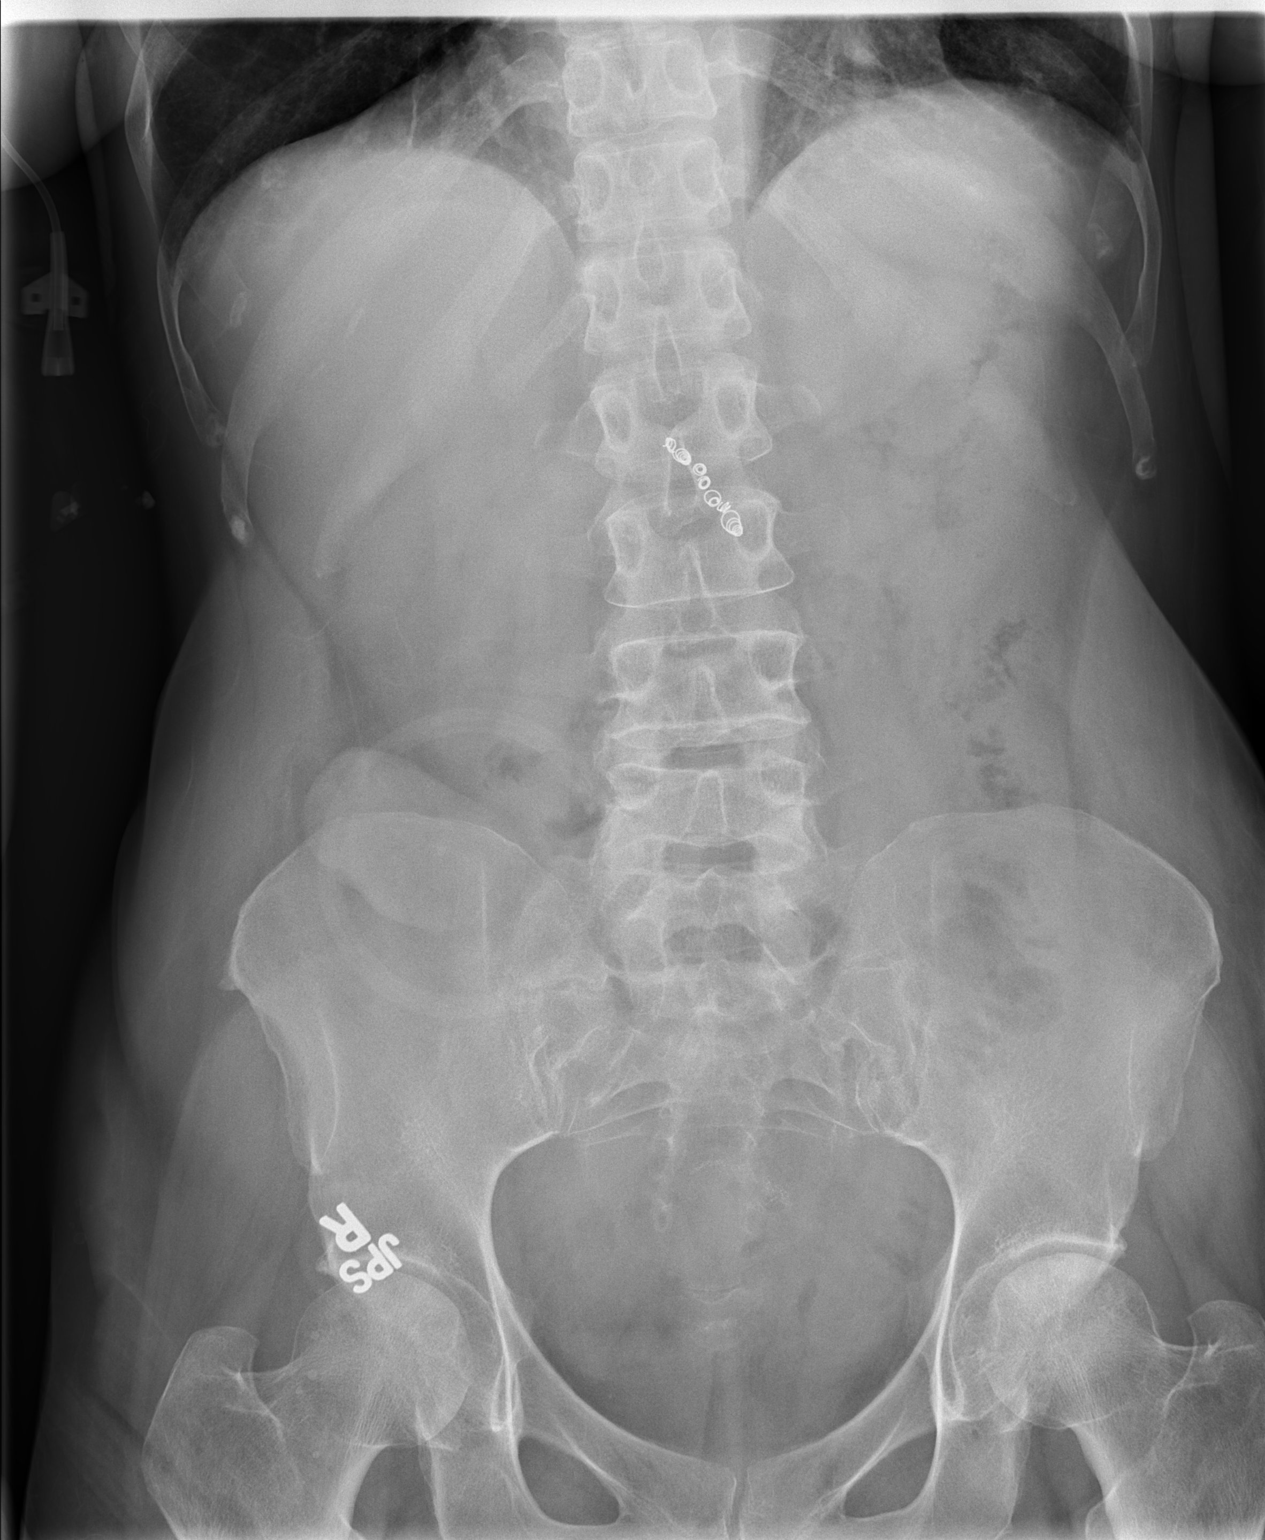

[2 of 2 positions shown; findings below may reference images not displayed]

FINDINGS: Nonobstructive bowel gas pattern. Prior prominent small bowel loop
in the left mid abdomen is not well visualized on the current study.
Right lower quadrant ostomy. Surgical sutures overlying the pelvis.

Embolization coils overlying the mid abdomen.

No evidence of free air under the diaphragm on the upright view.
IMPRESSION: No findings to suggest small bowel obstruction.

Right lower quadrant ostomy.

No free air.

## 2015-02-13 IMAGING — CR DG UGI W/ SMALL BOWEL
3 series · 3 of 3 positions shown · non-contrast
Comparison: Abdominal radiographs 06/23/2013 and CT abdomen/ pelvis
06/21/2013

FLUOROSCOPY TIME:  2 min 9 seconds

CLINICAL DATA: Frequent small bowel obstruction, Crohn's disease,
abdominal pain, prior subtotal colectomy and ileostomy

EXAM:
UPPER GI SERIES WITH SMALL BOWEL FOLLOW-THROUGH WITH WATER-SOLUBLE
CONTRAST
TECHNIQUE: Combined double contrast and single contrast upper GI series using
water-soluble contrast per referring physician request.
Subsequently, serial images of the small bowel were obtained
including spot views of the terminal ileum.

[view not recorded (1 of 3)]
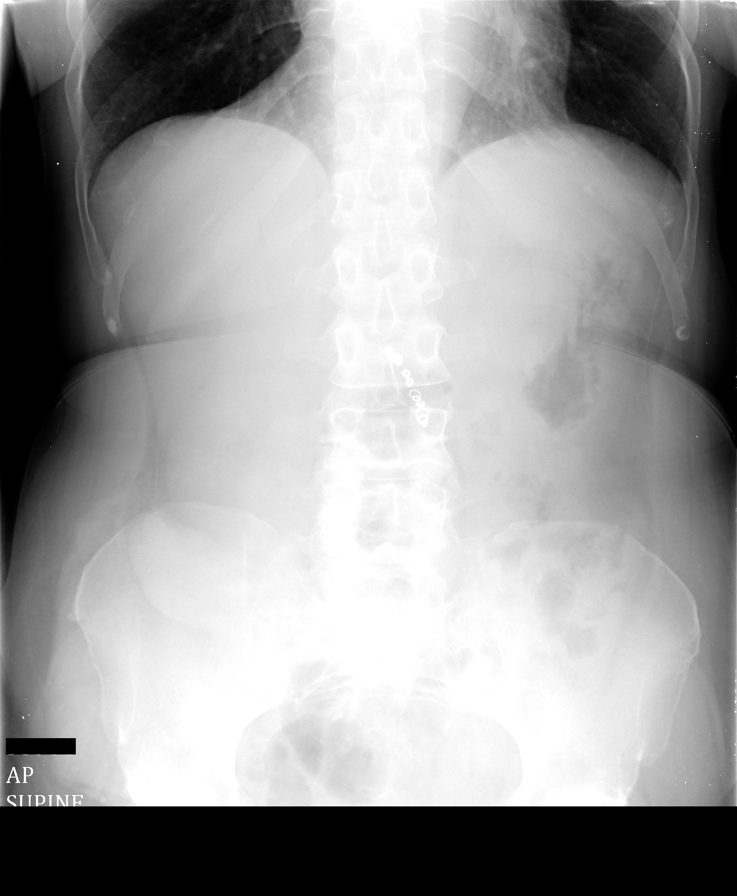

[view not recorded (2 of 3)]
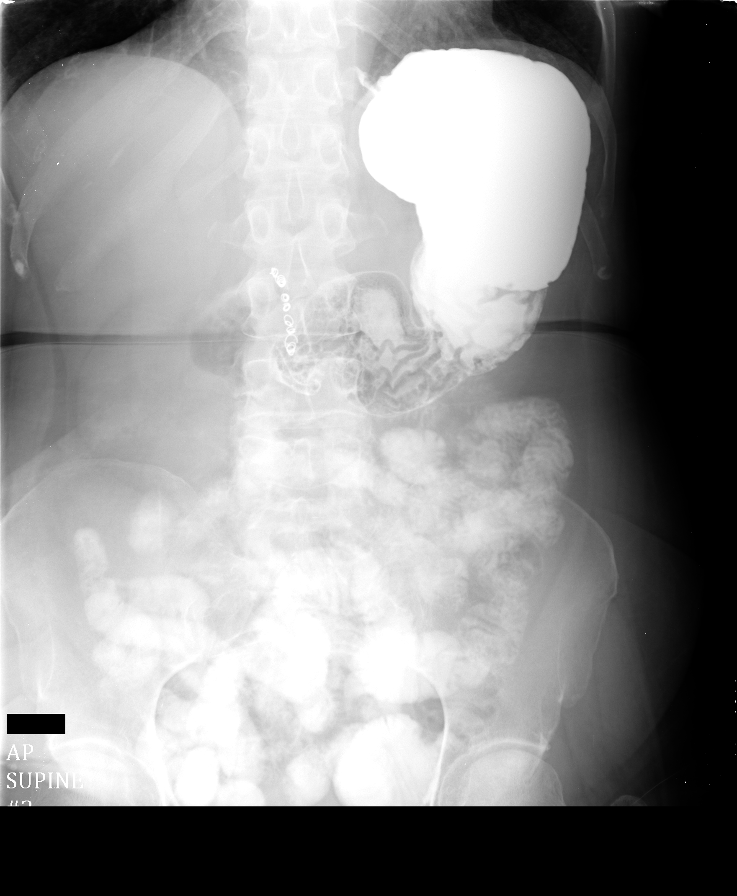

[view not recorded (3 of 3)]
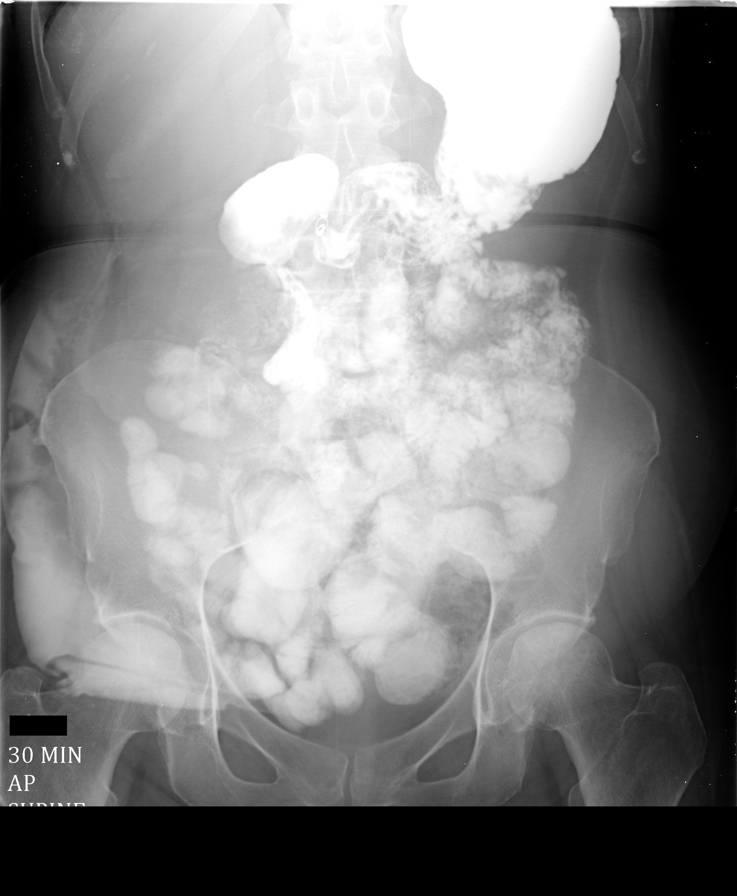

[3 of 3 positions shown; findings below may reference images not displayed]

FINDINGS: Normal bowel gas pattern on scout image.

A series of coils are arrayed in a linear fashion in the mid abdomen
suggesting prior embolization though patient is unaware of such a
prior procedure.

Normal esophageal distention and motility.

No esophageal stricture or obstruction.

No gastroesophageal reflux or persistent intraluminal filling
defects.

Stomach distends normally with normal rugal fold pattern.

No mass, ulceration or gastric outlet obstruction.

Ligament of Treitz normal position.

Duodenal C loop grossly normal in course though suboptimally
distended and opacified during the exam, no gross abnormality seen.

Normal transit of contrast through small bowel loops to the
ileostomy at 1 hr.

Small bowel loops are suboptimally visualized due to use of
water-soluble contrast material, no gross wall thickening or mucosal
irregularity identified.

No evidence of stricture or obstruction.

A single loop in the central to right pelvis is slightly larger than
remaining loops though contrast passes beyond without obstruction.

Compression views demonstrate normal mucosal folds without focal
abnormality or persistent intraluminal filling defect.
IMPRESSION: Normal exam.

No focal bowel abnormalities or evidence of obstruction identified.

## 2015-02-28 IMAGING — CR DG ABDOMEN ACUTE W/ 1V CHEST
3 series · 3 of 3 positions shown · non-contrast
Comparison: 06/23/2013 and earlier.

CLINICAL DATA: 62-year-old female with left lower quadrant pain,
colostomy. History of total colectomy Nausea and vomiting. Initial
encounter. Crohn disease.

EXAM:
ACUTE ABDOMEN SERIES (ABDOMEN 2 VIEW & CHEST 1 VIEW)

[x chest ap]
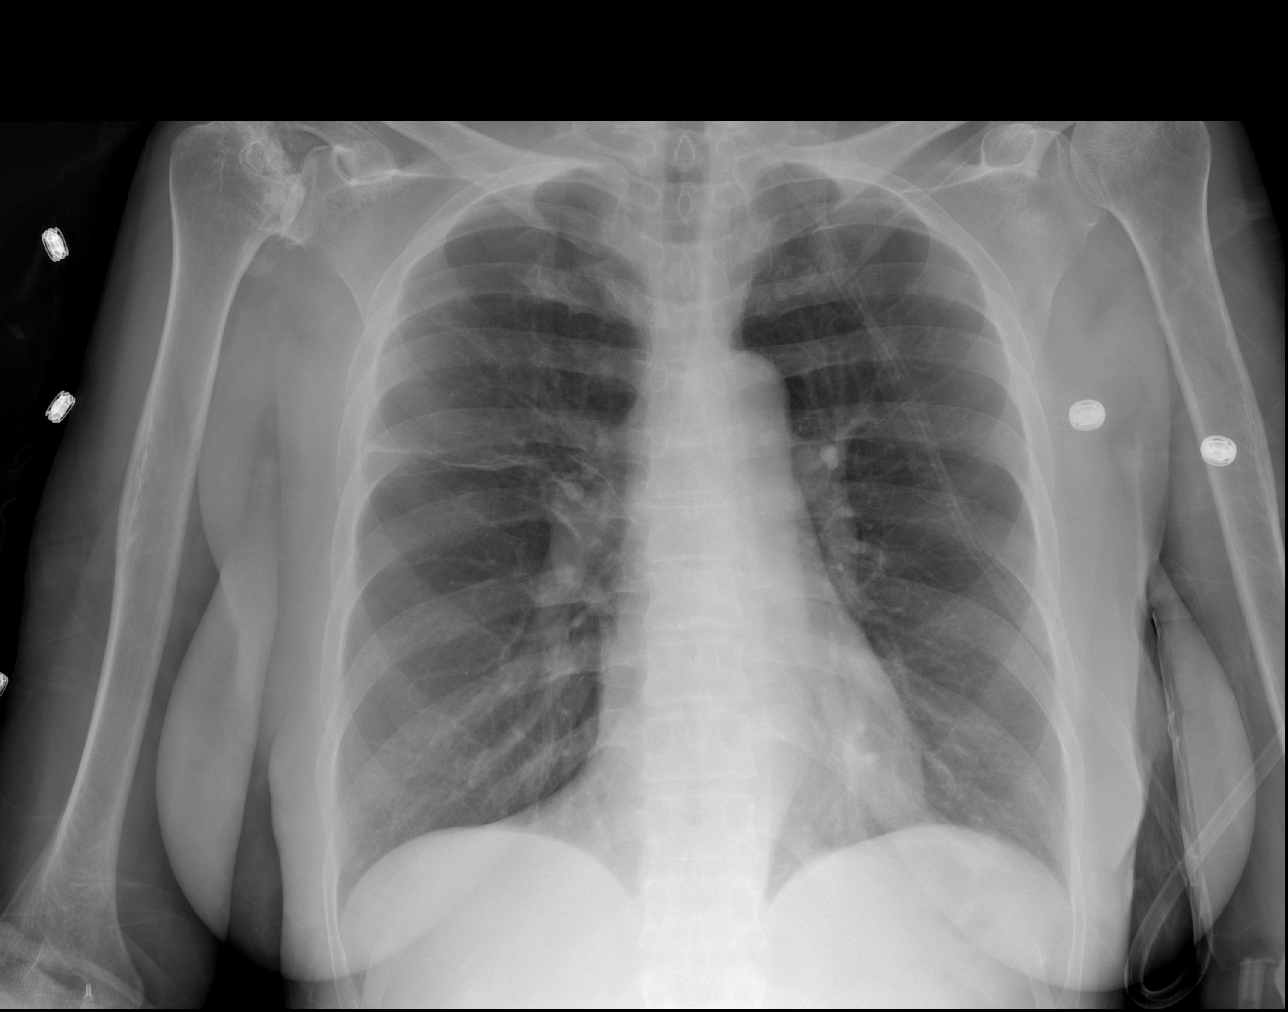

[w abdomen decub]
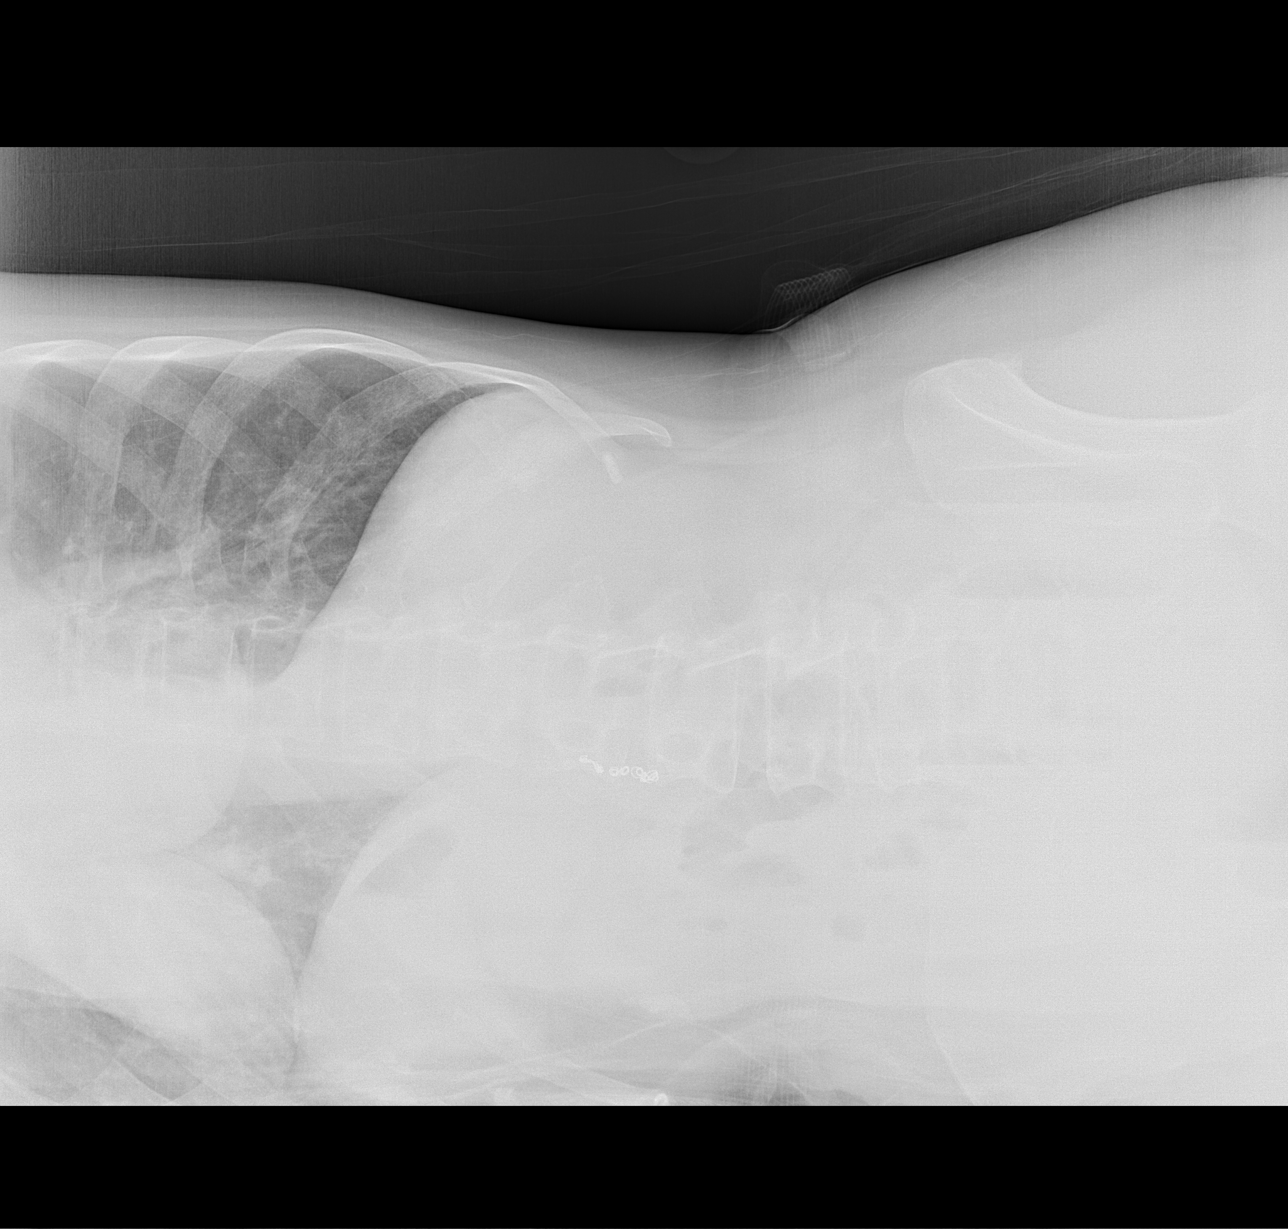

[x abdomen supine]
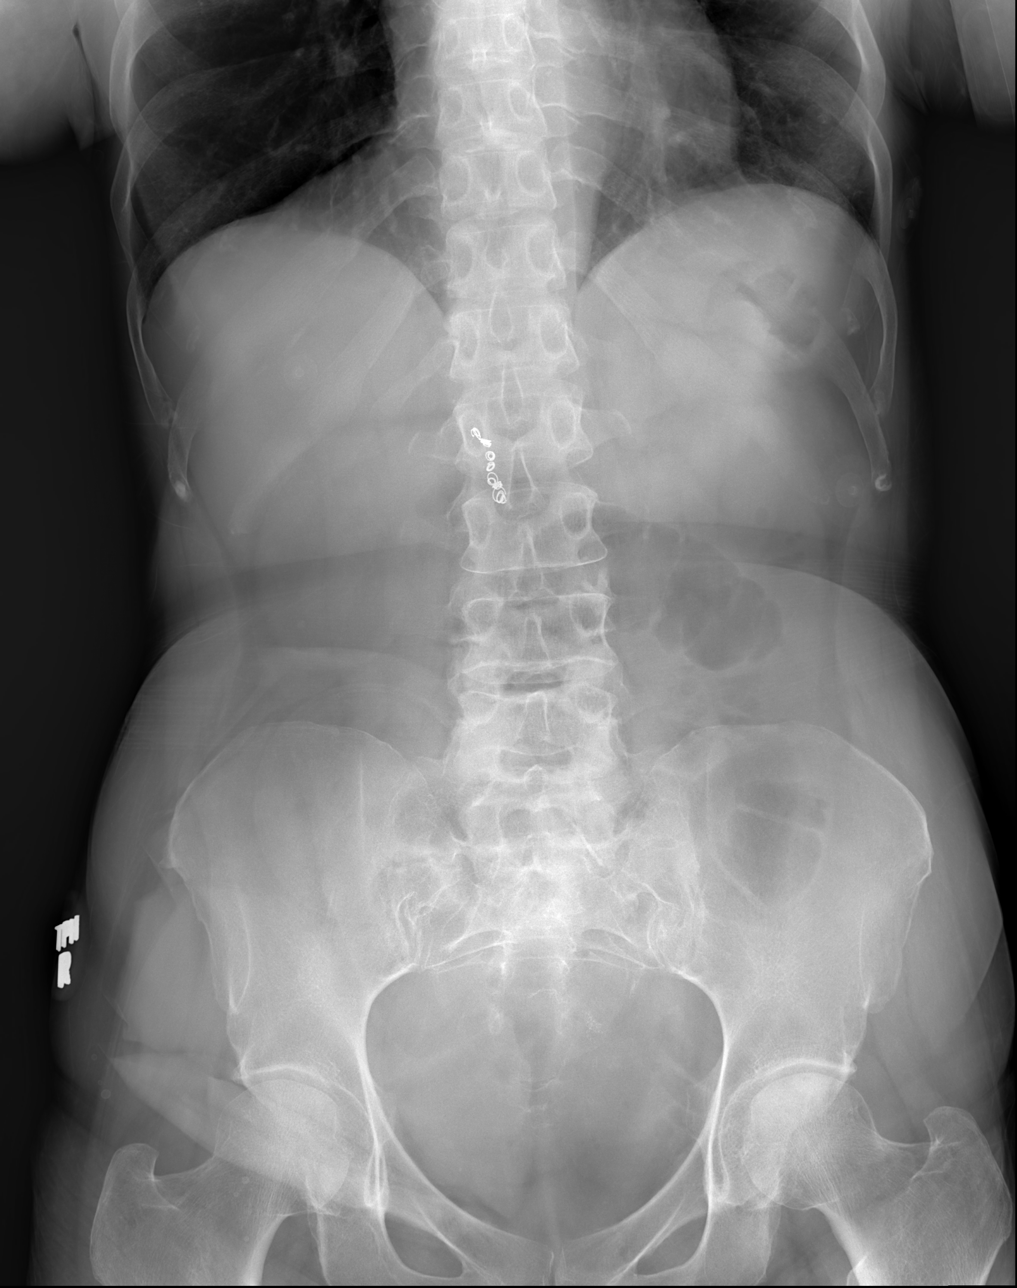

[3 of 3 positions shown; findings below may reference images not displayed]

FINDINGS: Semi upright AP view of the chest at 5059 hrs. Chronic scarring or
atelectasis. Stable lung volumes. Normal cardiac size and
mediastinal contours. Visualized tracheal air column is within
normal limits. No pneumothorax or pneumoperitoneum. Advanced chronic
osseous changes at the right shoulder.

No pneumoperitoneum. Stable epigastric embolization coils. Right
lower quadrant ostomy re- identified. Gas-filled small bowel in the
left abdomen measuring up to 34 mm diameter, similar to prior
studies. No other dilated loops are evident. No acute osseous
abnormality identified.
IMPRESSION: 1. No free air. Stable mildly prominent left abdominal small bowel.
Right lower quadrant ostomy.
2.  No acute cardiopulmonary abnormality.

## 2015-03-02 IMAGING — CT CT ABD-PELV W/ CM
2 of 5 series · 16 of 46 positions shown, 18 images · IV contrast (APPLIED)
Comparison: 06/21/2013

CLINICAL DATA: Recurrent abdominal pain. Crohn's disease. Recurrent
small-bowel obstructions and abscess. Previous total colectomy and
ileostomy.

EXAM:
CT ABDOMEN AND PELVIS WITH CONTRAST
TECHNIQUE: Multidetector CT imaging of the abdomen and pelvis was performed
using the standard protocol following bolus administration of
intravenous contrast.
CONTRAST:  80mL OMNIPAQUE IOHEXOL 300 MG/ML  SOLN

[Series 2: abd/ pelvis 5.0 i30f 1 · axial · 0.50mm/px · z∈[+794,+1154]mm · 13 of 80 slices shown, 15 images]
[im 4/80  soft-tissue]
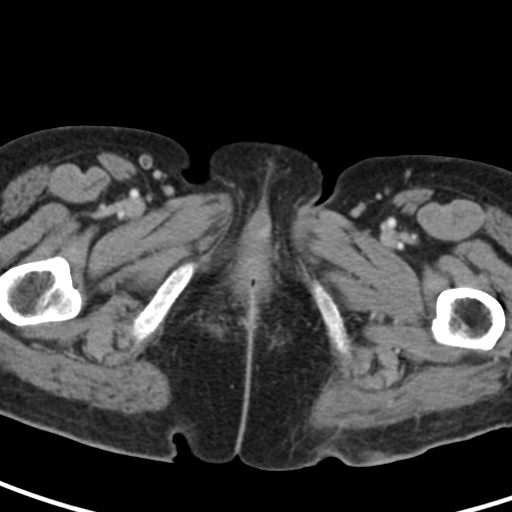
[im 4/80  bone]
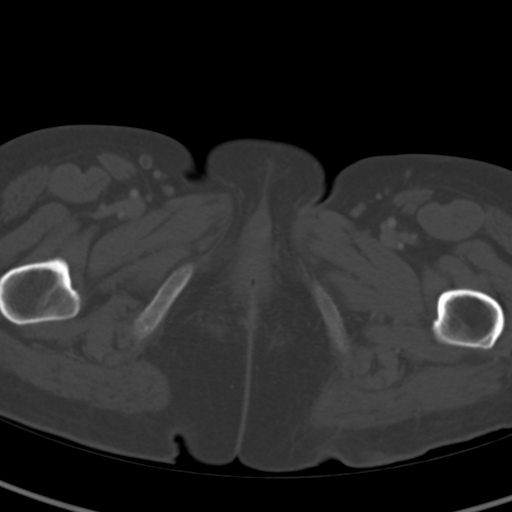
[im 12/80  soft-tissue]
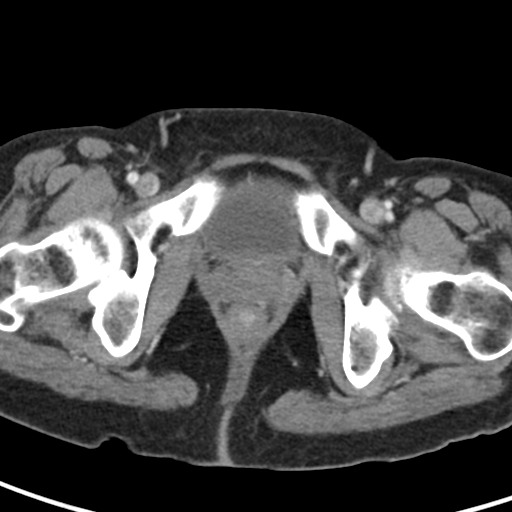
[im 16/80  soft-tissue]
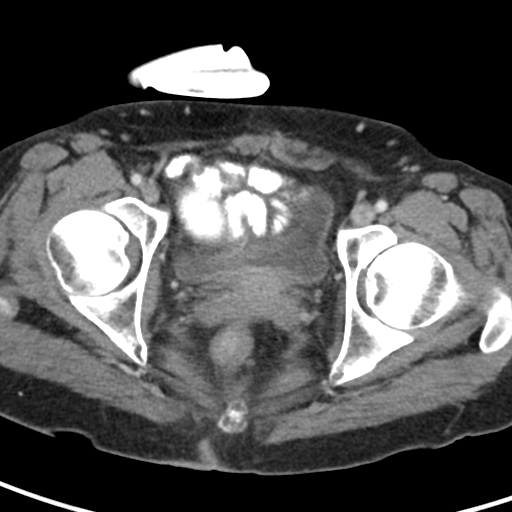
[im 24/80  soft-tissue]
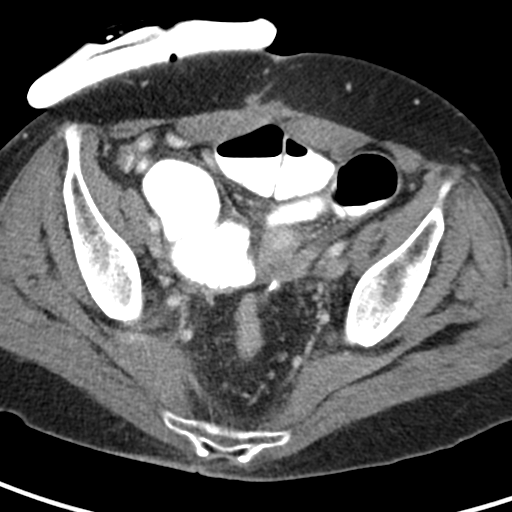
[im 28/80  soft-tissue]
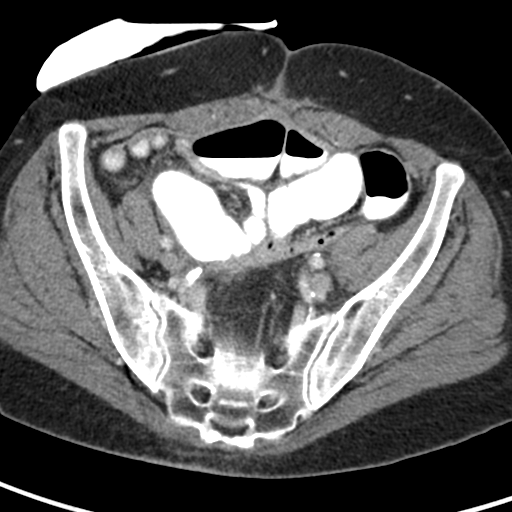
[im 36/80  soft-tissue]
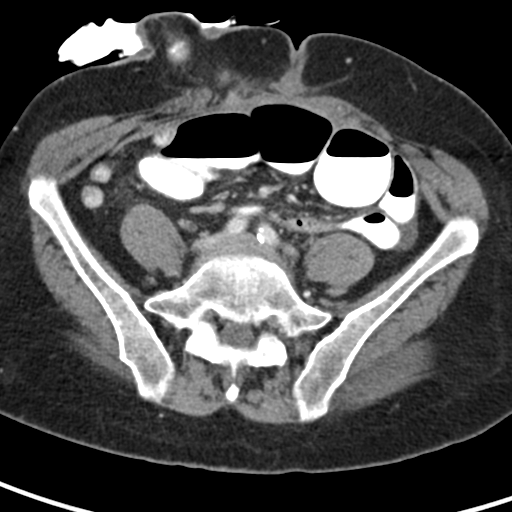
[im 40/80  soft-tissue]
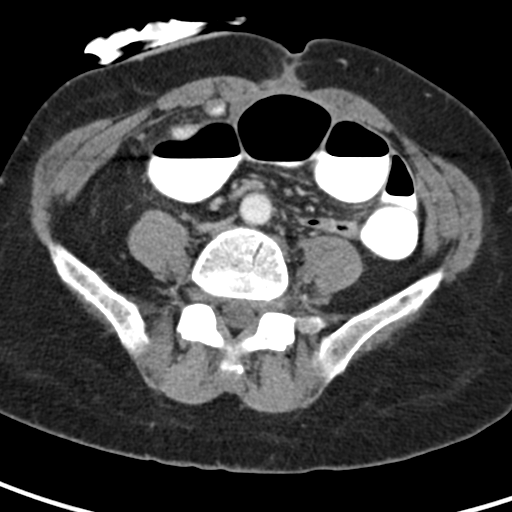
[im 44/80  soft-tissue]
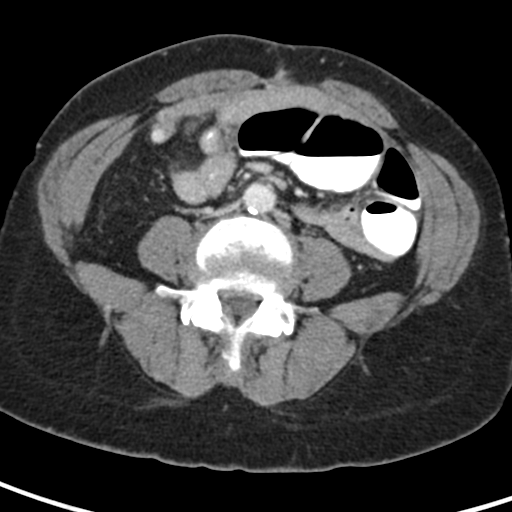
[im 52/80  soft-tissue]
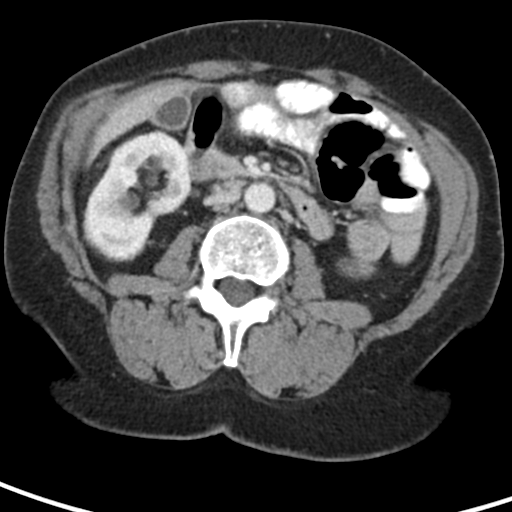
[im 52/80  bone]
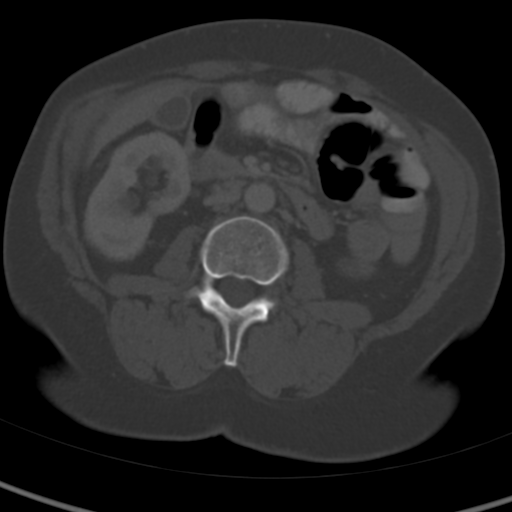
[im 56/80  soft-tissue]
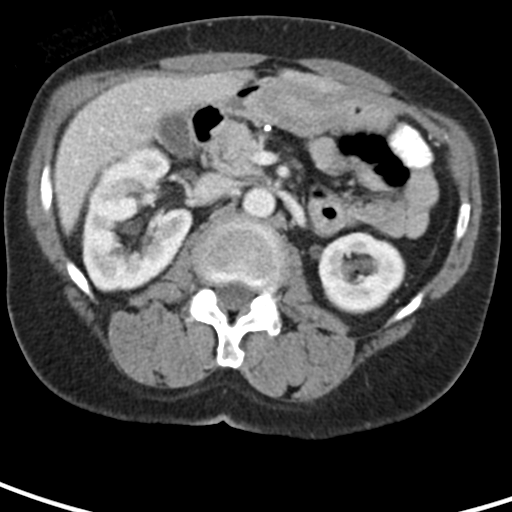
[im 64/80  soft-tissue]
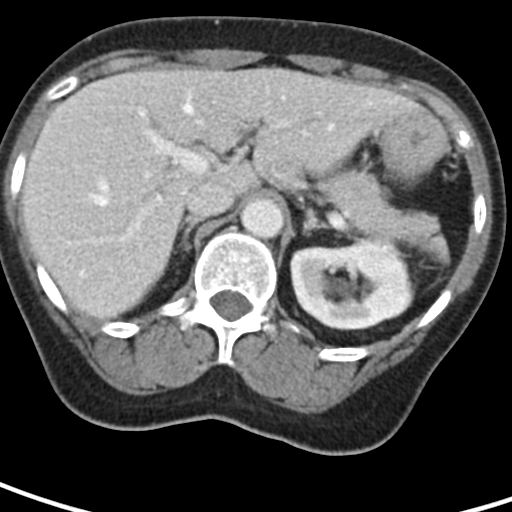
[im 68/80  soft-tissue]
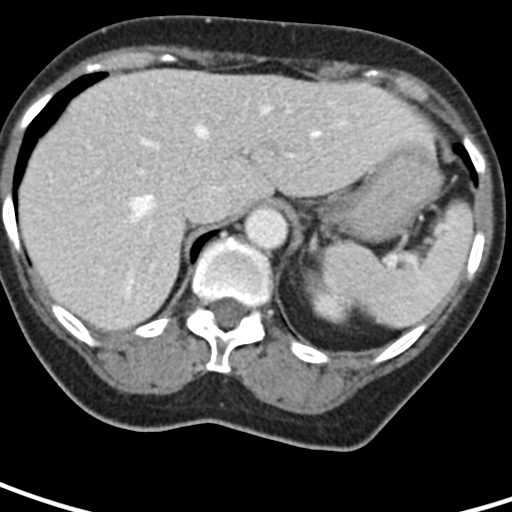
[im 76/80  soft-tissue]
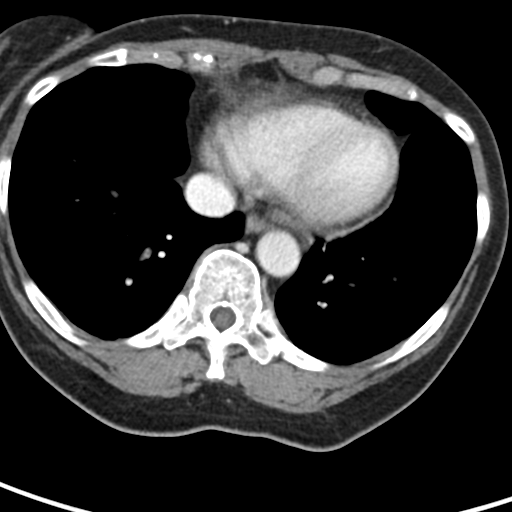

[Series 5: coronal soft tissue · coronal · 0.53mm/px · 3 of 65 slices shown]
[im 22/65  soft-tissue]
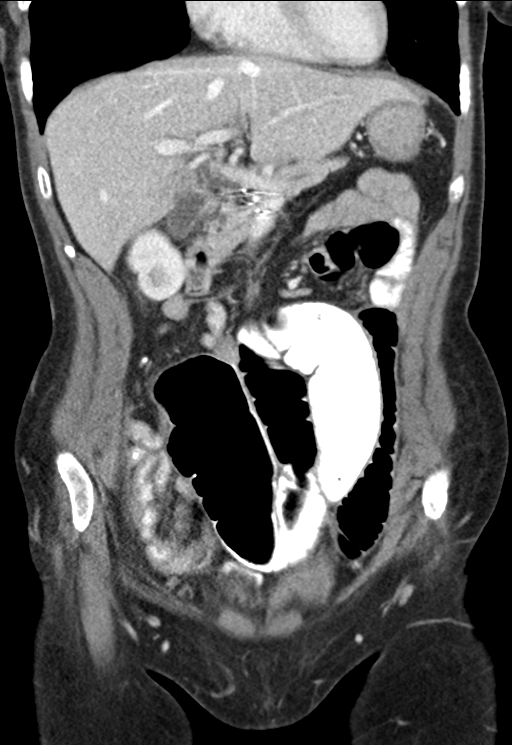
[im 29/65  soft-tissue]
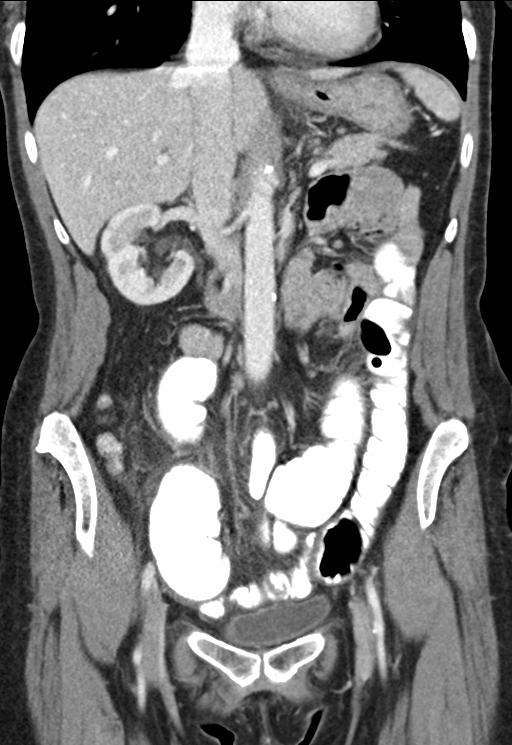
[im 36/65  soft-tissue]
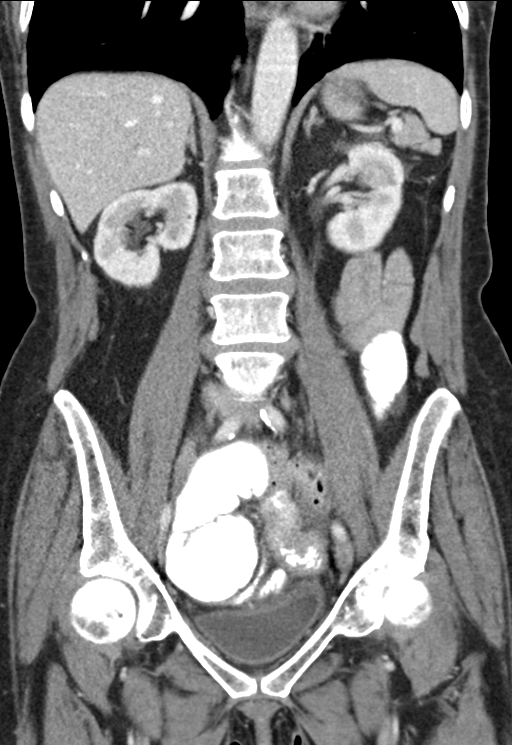

[16 of 46 positions shown; findings below may reference images not displayed]

FINDINGS: Previous total colectomy and right lower quadrant ileostomy are
again demonstrated. There is increased dilatation of distal small
bowel loops seen in the lower abdomen and pelvis, with a transition
point seen in the central pelvis, where there is increased small
bowel wall thickening. This appears contiguous with the vaginal cuff
where there is a complex cystic lesion measuring 2.4 x 3.3 cm on
image 62, which is mildly increased in size compared to 1.6 x 2.7 cm
previously. This constellation of findings suggests increased
inflammatory process involving distal small bowel and possible
abscess involving the vaginal cuff, with neoplasm considered less
likely.

The liver, gallbladder, pancreas, spleen, adrenal glands, and
kidneys are normal in appearance. No evidence of hydronephrosis. No
abdominal or pelvic lymphadenopathy identified.
IMPRESSION: Increased distal small bowel obstruction. Increased small bowel wall
thickening and small complex collection involving the vaginal cuff
at the transition point in central pelvis, suspicious for recurrence
of Crohn's disease, with neoplasm considered less likely.

## 2015-03-18 IMAGING — CT CT ABD-PELV W/ CM
3 of 5 series · 12 of 36 positions shown, 18 images · IV contrast (READICAT/WATER & [ID] OMNI 300)
Comparison: CT of the abdomen pelvis of 07/12/2013

CLINICAL DATA: History of pelvic abscess, severe pain the last few
days, also history of Crohn's and prior small bowel obstruction

EXAM:
CT ABDOMEN AND PELVIS WITH CONTRAST
TECHNIQUE: Multidetector CT imaging of the abdomen and pelvis was performed
using the standard protocol following bolus administration of
intravenous contrast.
CONTRAST:  100mL OMNIPAQUE IOHEXOL 300 MG/ML  SOLN

[Series 3: abd/pelvis with · axial · 0.65mm/px · z∈[-302,-58]mm · 6 of 69 slices shown, 11 images]
[im 10/69  soft-tissue]
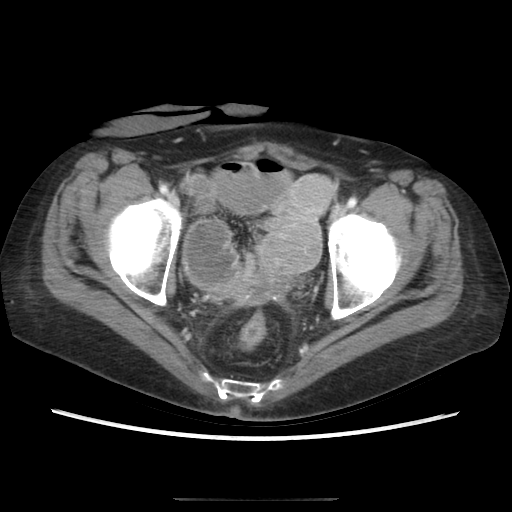
[im 10/69  bone]
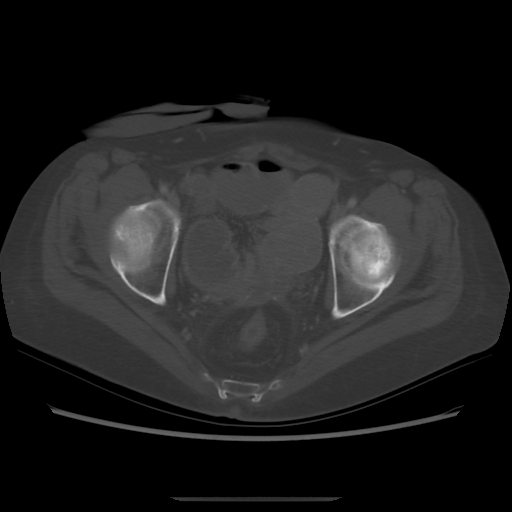
[im 20/69  soft-tissue]
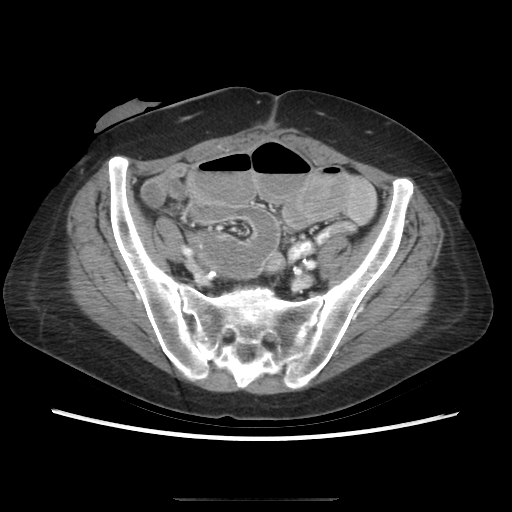
[im 30/69  soft-tissue]
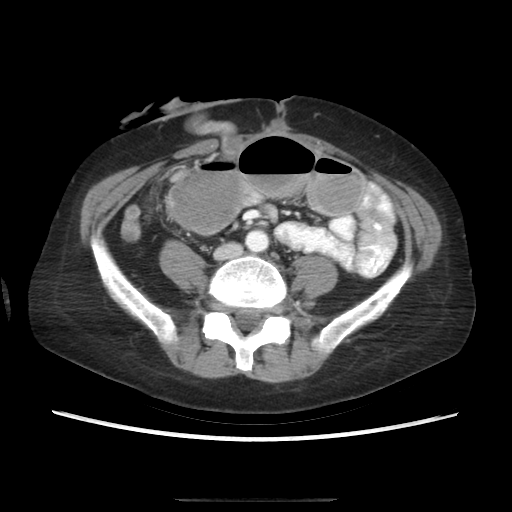
[im 30/69  lung]
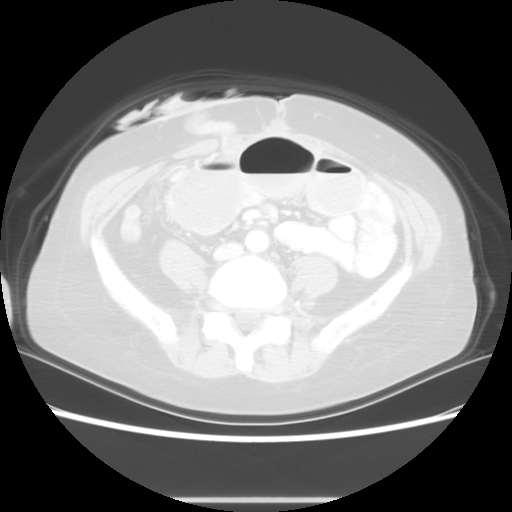
[im 39/69  soft-tissue]
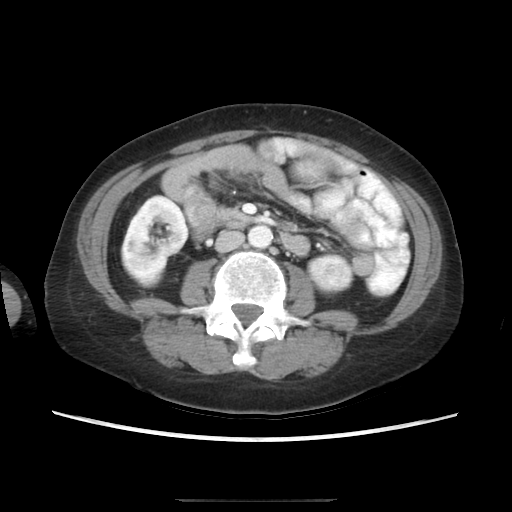
[im 39/69  lung]
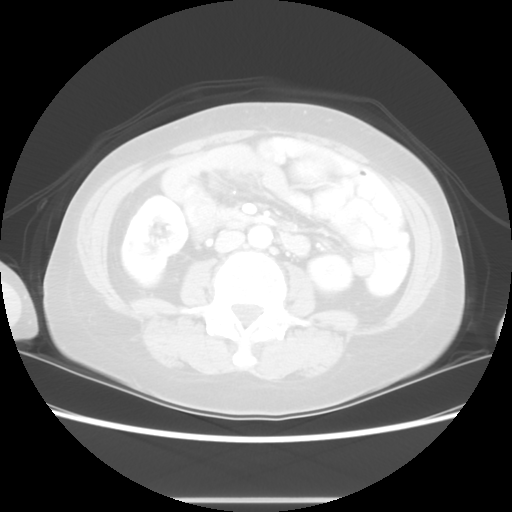
[im 49/69  soft-tissue]
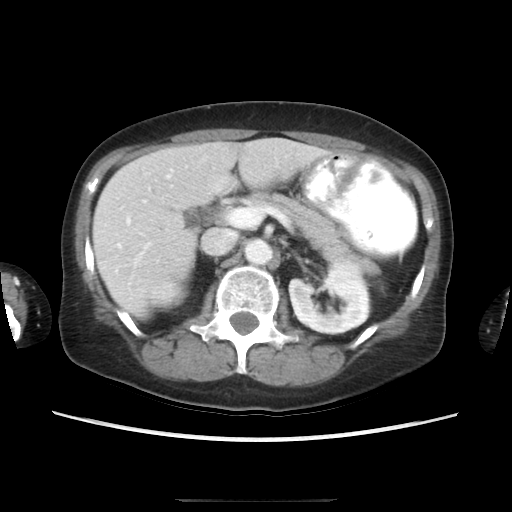
[im 49/69  lung]
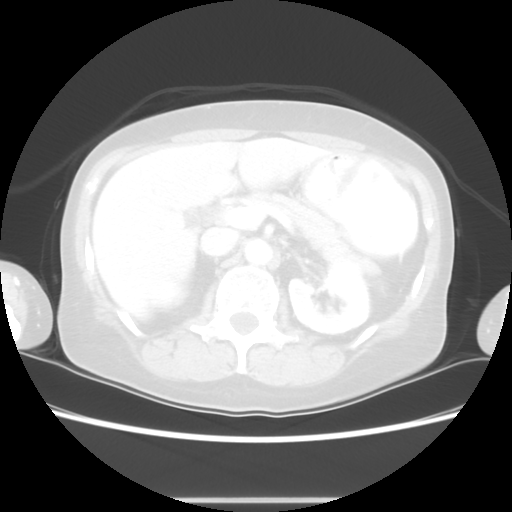
[im 59/69  soft-tissue]
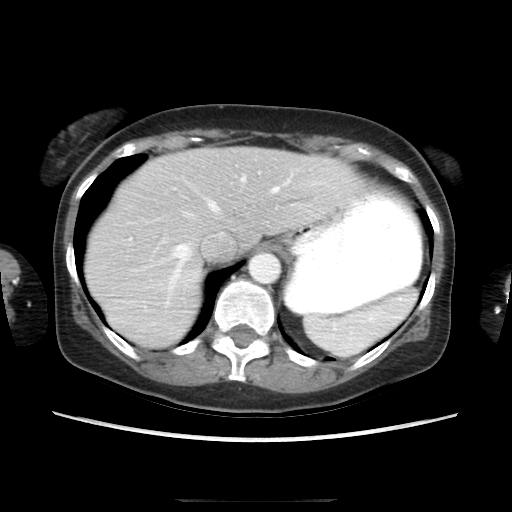
[im 59/69  lung]
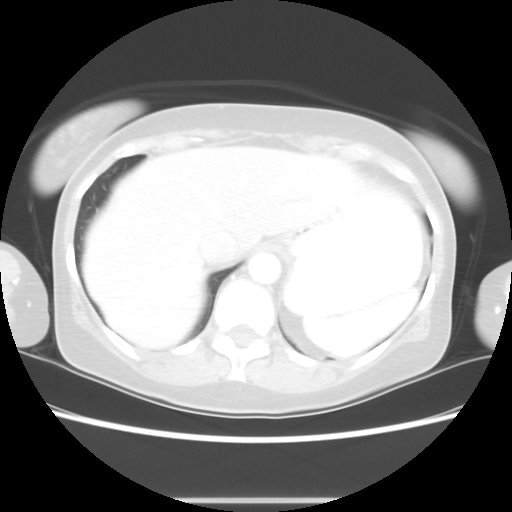

[Series 601: coronal body · coronal · 0.74mm/px · 1 of 93 slices shown, 2 images]
[im 31/93  soft-tissue]
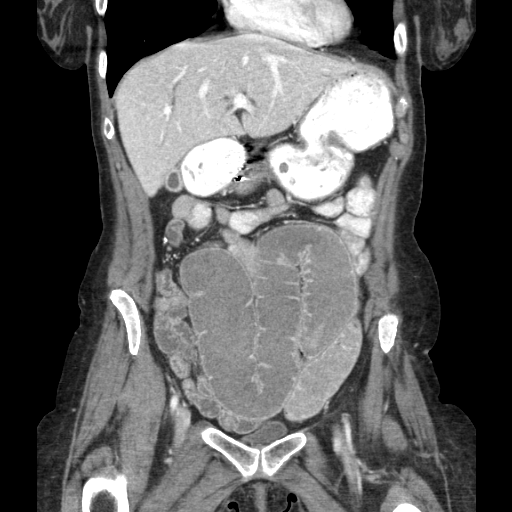
[im 31/93  bone]
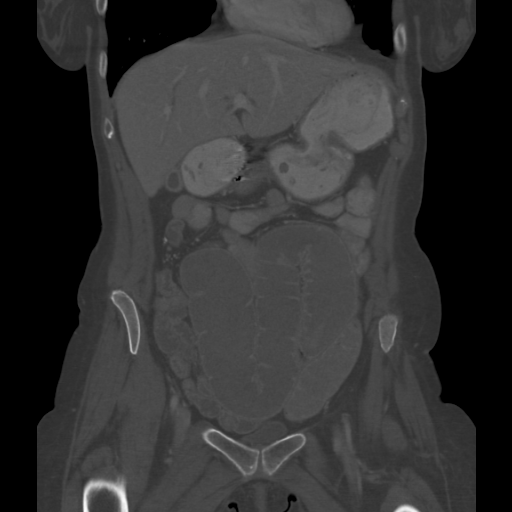

[Series 602: sagittal body · sagittal · 0.74mm/px · 5 of 135 slices shown]
[im 16/135  soft-tissue]
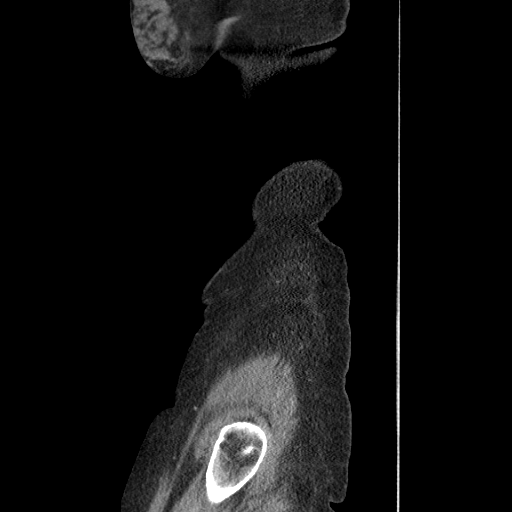
[im 32/135  soft-tissue]
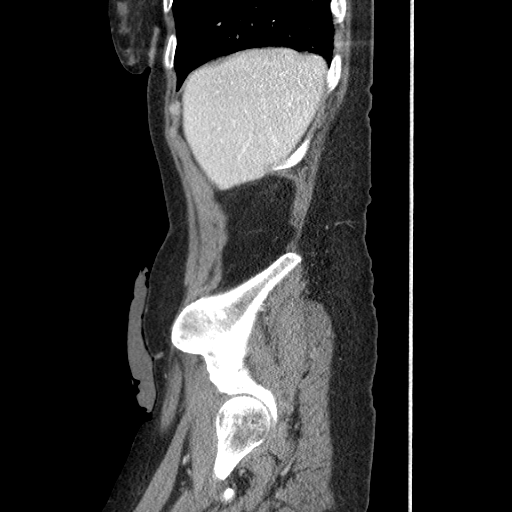
[im 48/135  soft-tissue]
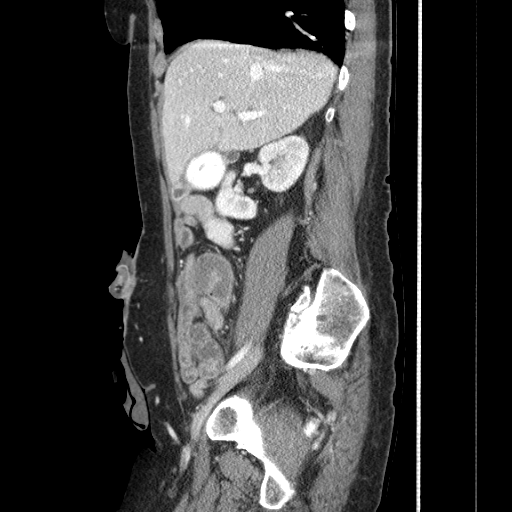
[im 64/135  soft-tissue]
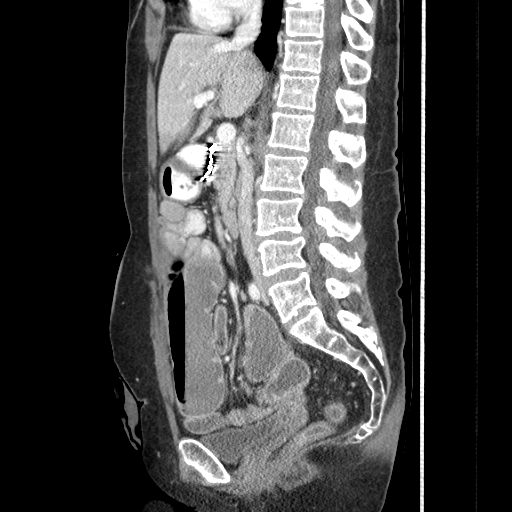
[im 79/135  soft-tissue]
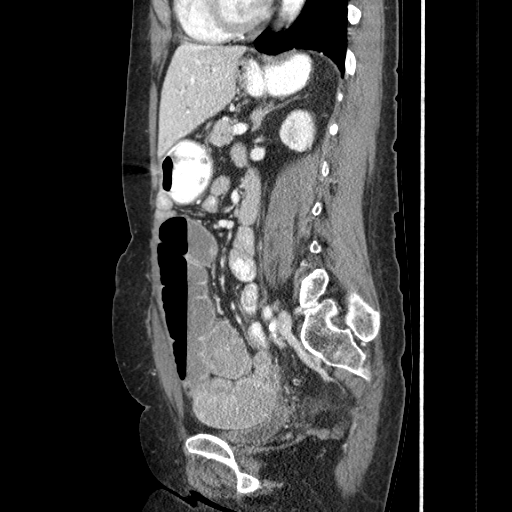

[12 of 36 positions shown; findings below may reference images not displayed]

FINDINGS: The lung bases are clear. The liver enhances with no focal
abnormality and no ductal dilatation is seen. Gallbladder is
contracted and no calcified gallstones are noted. The pancreas is
normal in size and the pancreatic duct is not dilated. The adrenal
glands and spleen are unremarkable. The stomach is distended with
contrast media and no abnormality is evident. The kidneys enhance
with no evidence of mass or hydronephrosis and no renal calculi are
noted the abdominal aorta is normal in caliber. Small
retroperitoneal nodes are present and stable.

The loculated collection near the vaginal cuff is again noted
consistent with pelvic abscess of 3.6 x 2.3 cm compared to 3.3 x
cm previously. This is consistent with abscess with loculations.
There is surrounding inflammatory reaction.

The urinary bladder is somewhat decompressed and difficult to
assess. As noted previously there is dilatation of distal small
bowel. Partial small bowel obstruction cannot be excluded. The
transition point between decompressed and dilated small a does
appear to be at the level of the pelvic abscess. The patient has
previously undergone colectomy with only a portion of the distal
rectosigmoid colon remaining. And ileostomy is noted in the right
lower quadrant. No bony abnormality seen.
IMPRESSION: 1. Persistent loculated mid pelvic abscess of 3.6 x 2.3 cm compared
to prior measurement of 3.3 x 2.4 cm.
2. Persistent dilatation of small bowel which appears to change
caliber at the level of the pelvic abscess. However, small bowel
leading to the ileostomy is normal in caliber.

## 2015-03-23 IMAGING — CR DG ABDOMEN ACUTE W/ 1V CHEST
3 series · 3 of 3 positions shown · non-contrast
Comparison: 07/28/2013 CT, 07/10/2013 abdominal series

CLINICAL DATA: Abdominal and chest pain, Crohn history.

EXAM:
ACUTE ABDOMEN SERIES (ABDOMEN 2 VIEW & CHEST 1 VIEW)

[w chest pa]
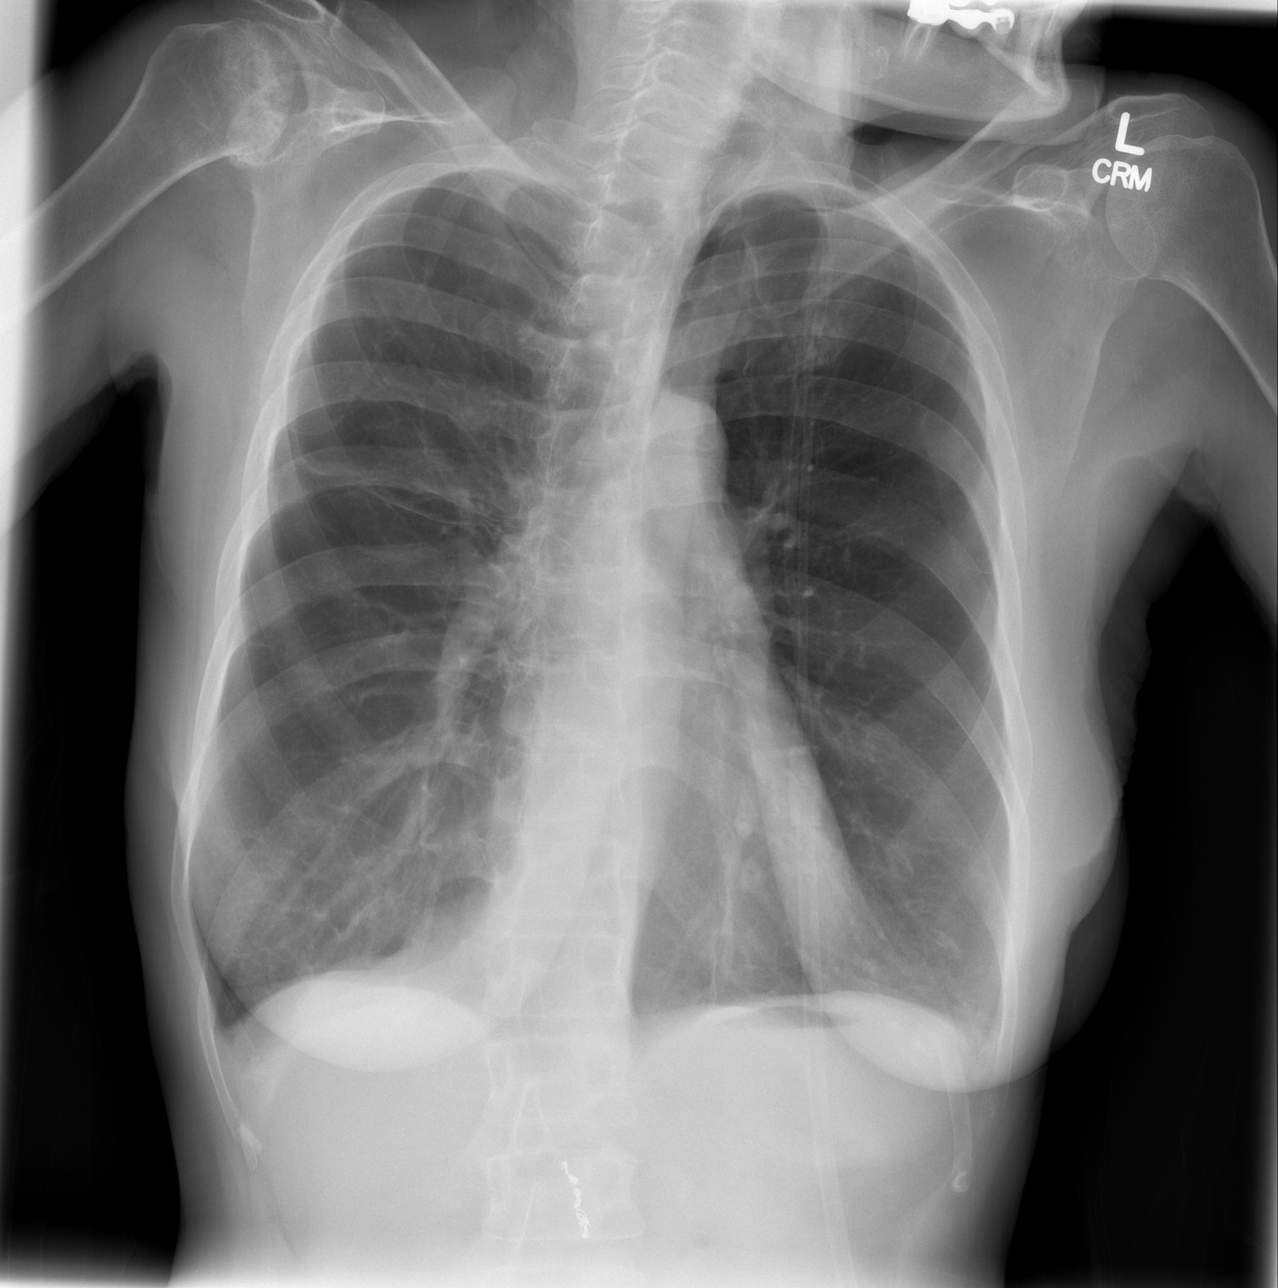

[w abdomen upright]
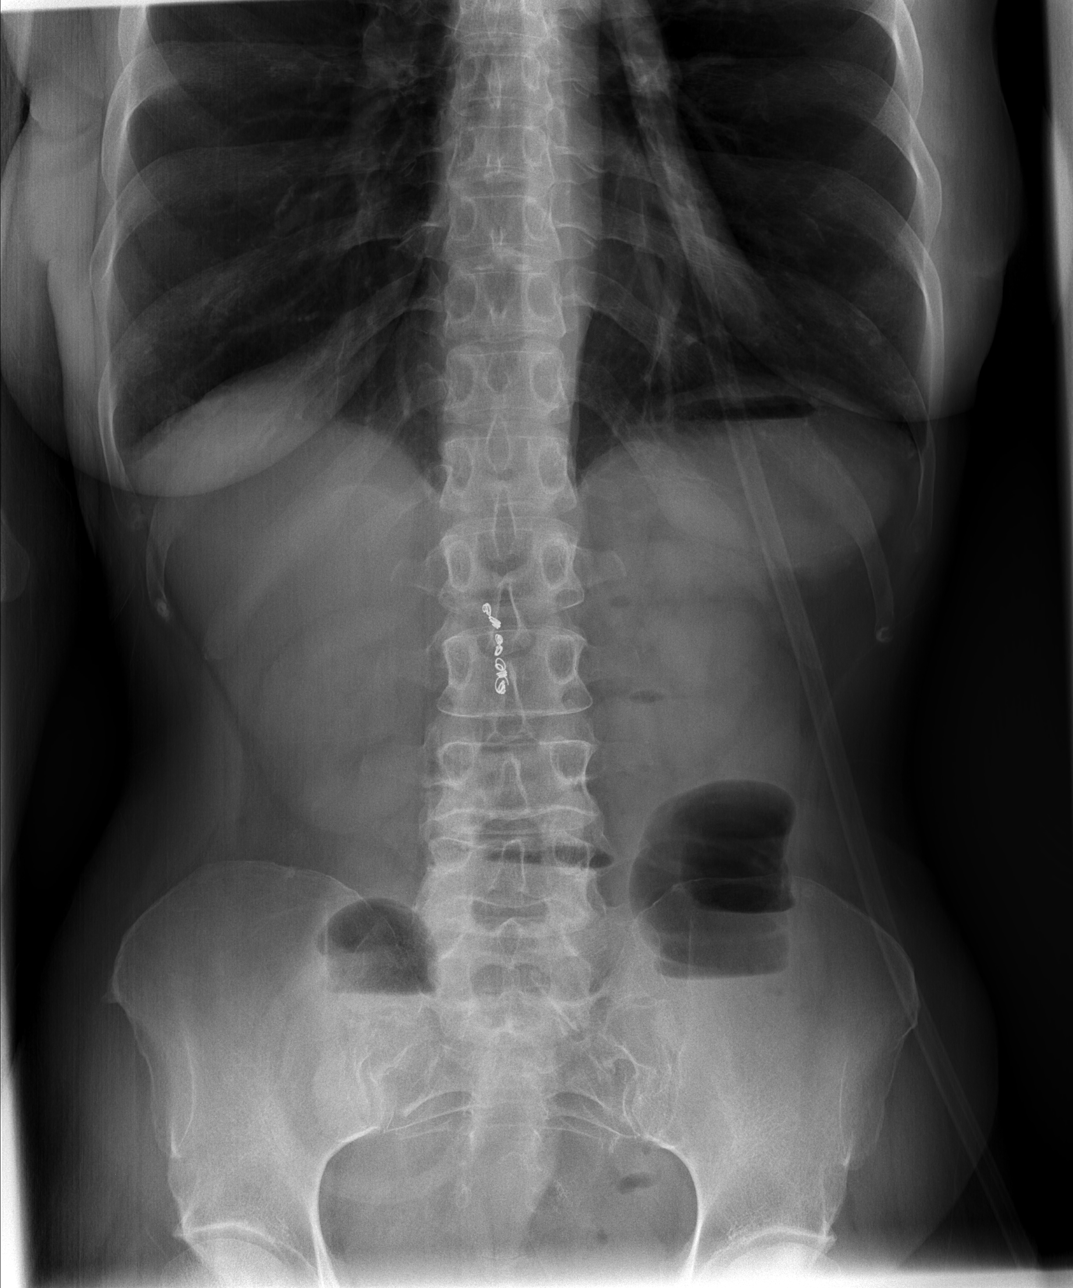

[t abdomen supine]
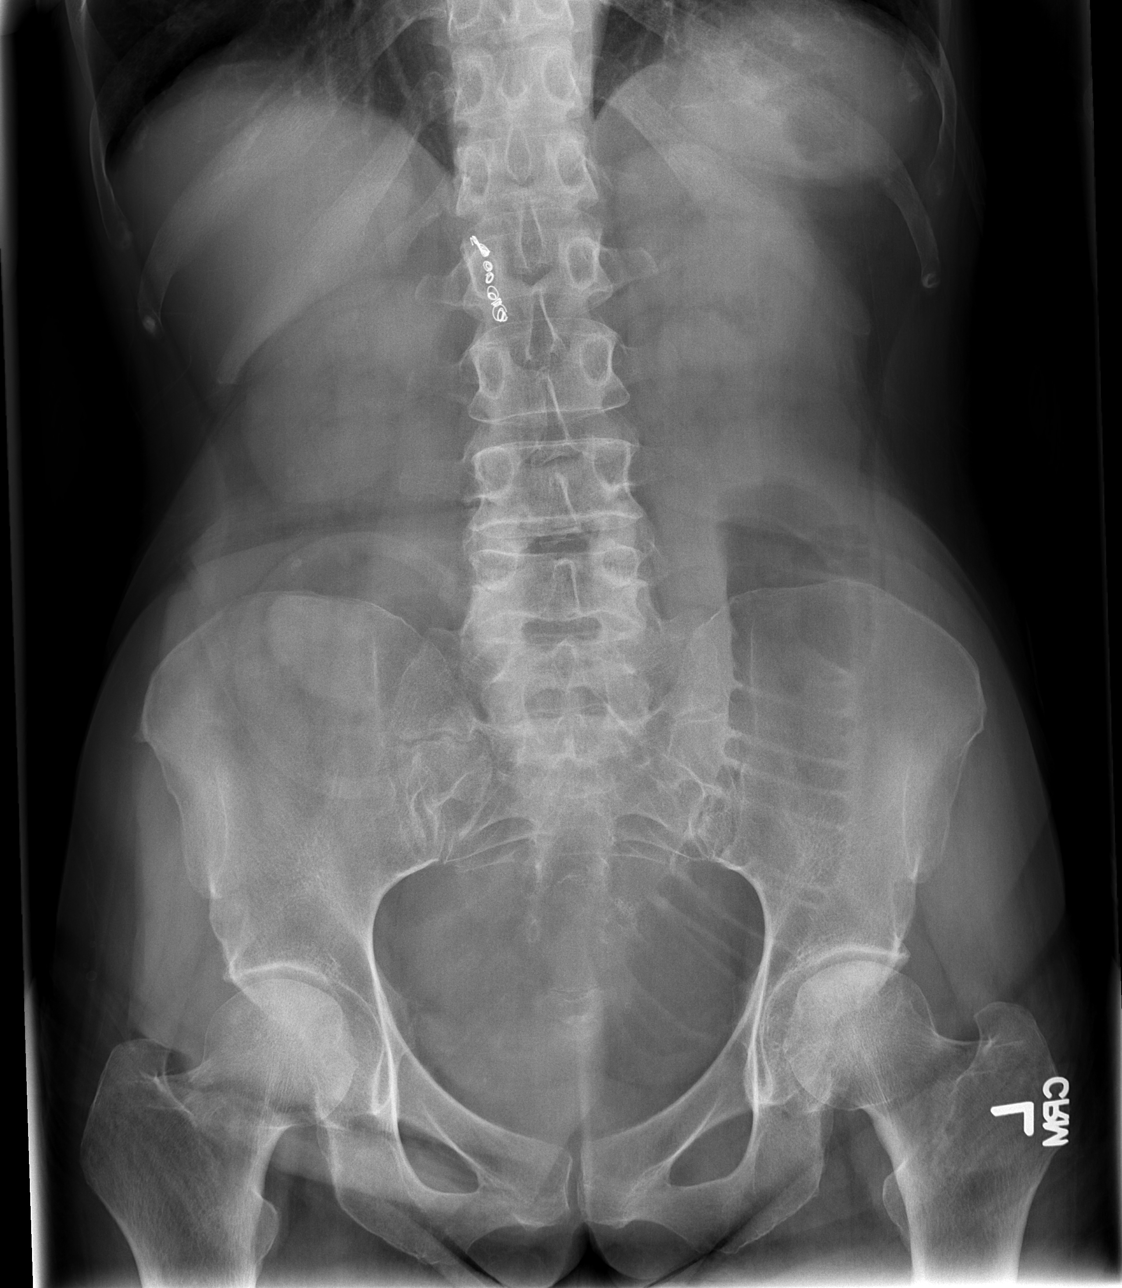

[3 of 3 positions shown; findings below may reference images not displayed]

FINDINGS: Lungs remain hypoaerated with large right apical bullae. No
confluent airspace opacity. Cardiomediastinal contours within normal
range.

No free intraperitoneal air. Relative paucity of bowel gas other
than a dilated loop of small bowel up to 4.7 cm within the left
lower quadrant. Right lower quadrant ileostomy. Evidence of prior
embolization perhaps along the gastroduodenal artery. No acute
osseous finding.
IMPRESSION: Relative paucity of bowel gas other than a dilated bowel loop in the
left lower quadrant up to 4.7 cm. Small-bowel obstruction not
excluded.

## 2015-03-31 IMAGING — CR DG ABDOMEN ACUTE W/ 1V CHEST
3 series · 3 of 3 positions shown · non-contrast
Comparison: DG ABD ACUTE W/CHEST dated 08/02/2013; CT ABD/PELVIS W
CM dated 07/28/2013

CLINICAL DATA: Left-sided abdominal pain. History of Crohn's
disease.

EXAM:
ACUTE ABDOMEN SERIES (ABDOMEN 2 VIEW & CHEST 1 VIEW)

[w chest pa]
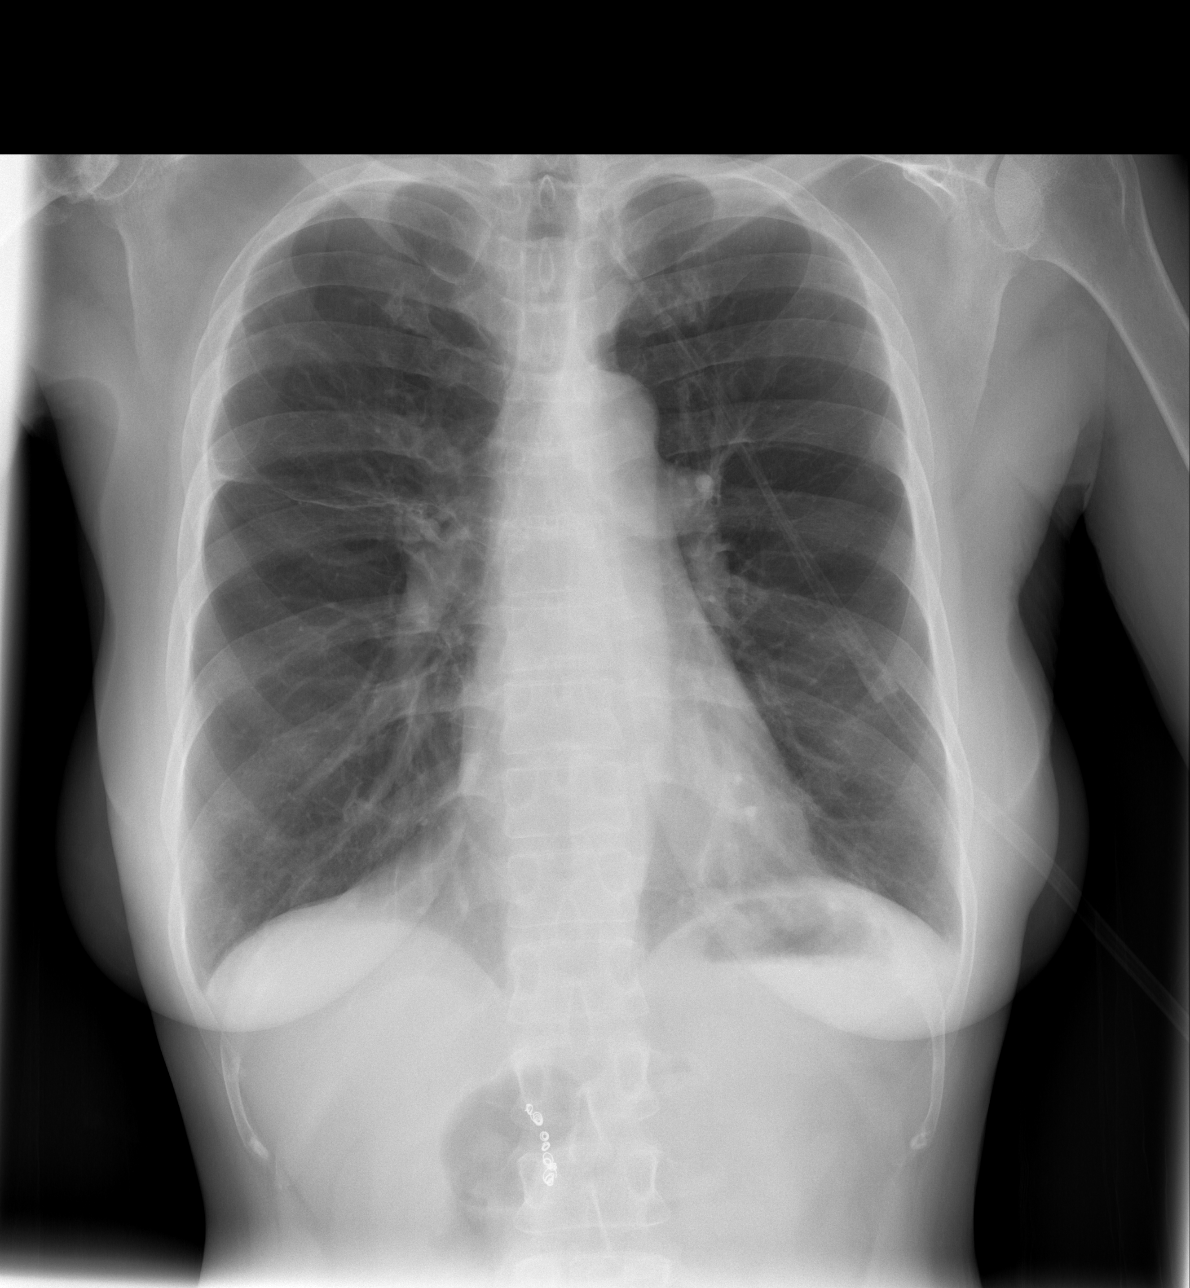

[w abdomen upright]
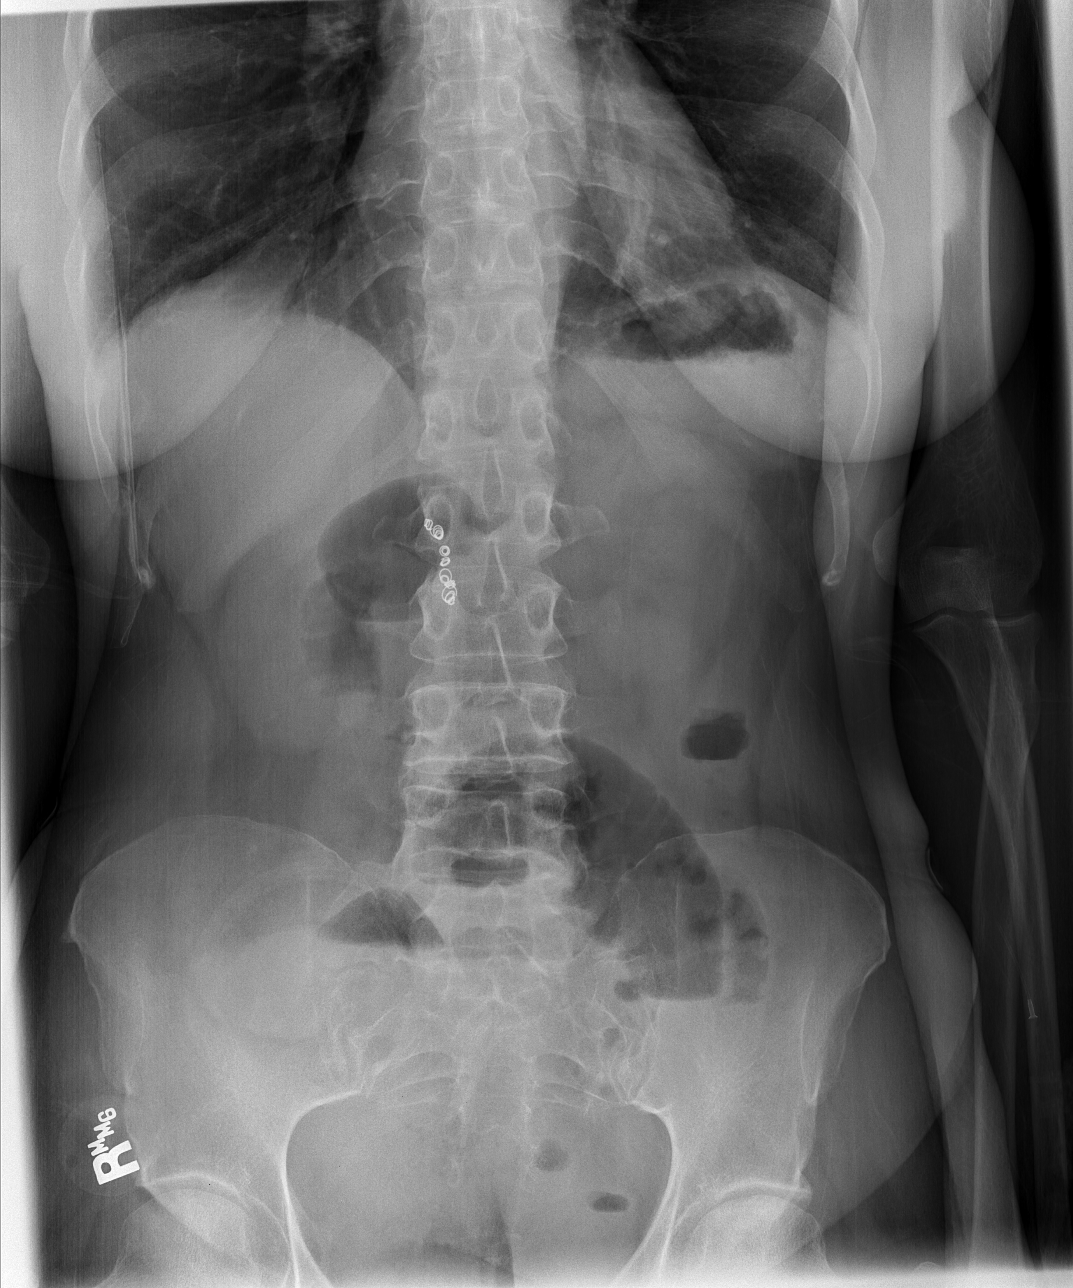

[t abdomen supine]
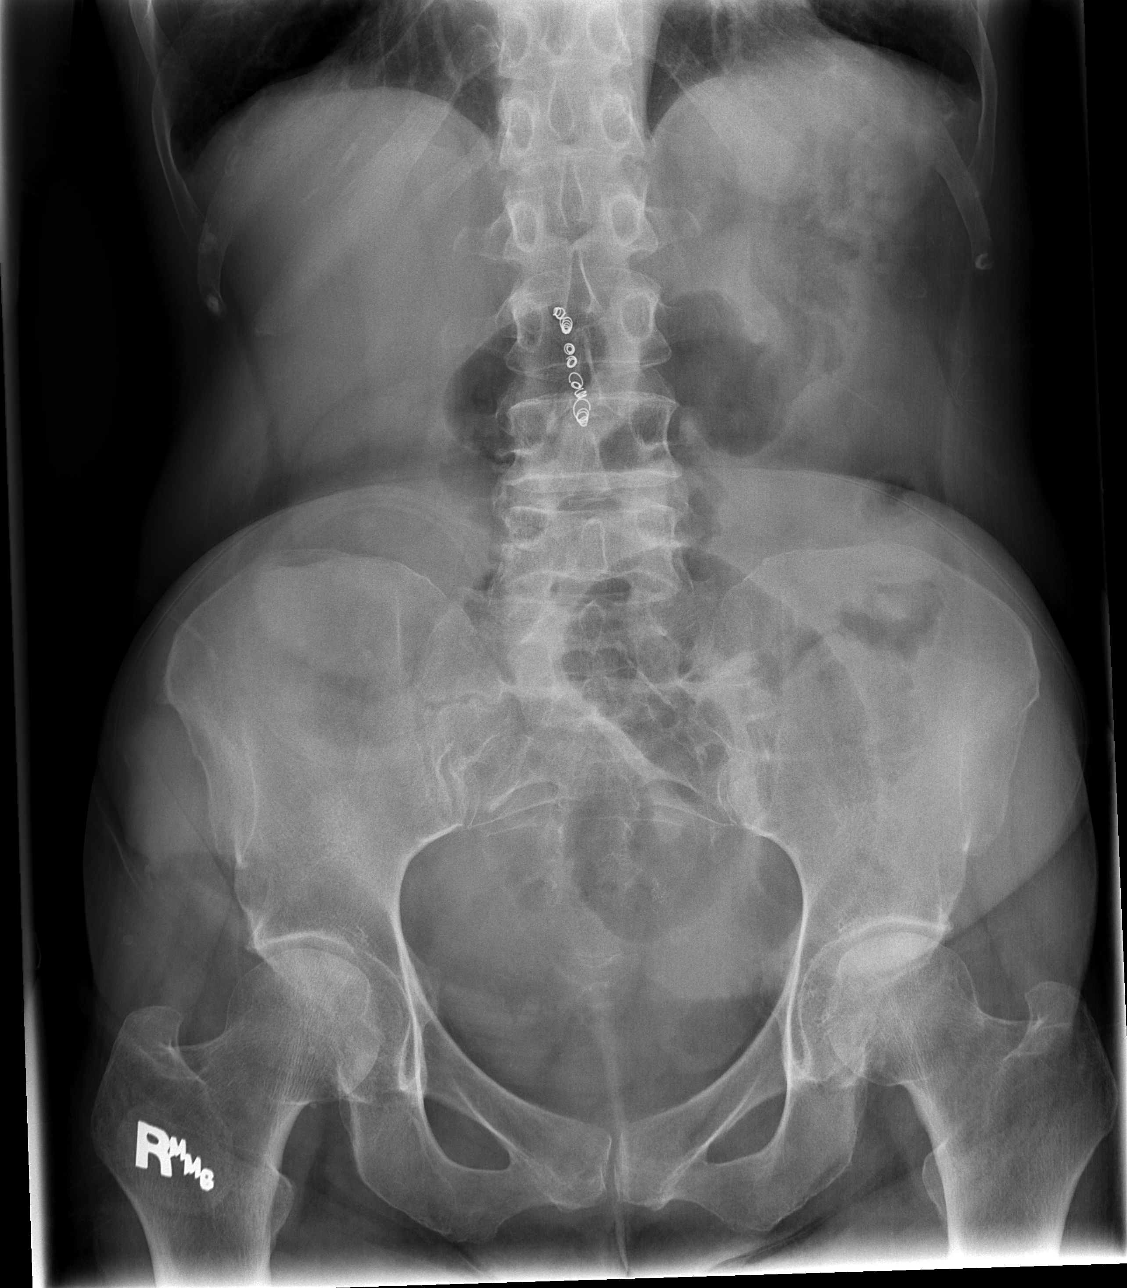

[3 of 3 positions shown; findings below may reference images not displayed]

FINDINGS: The heart size and mediastinal contours are stable. The lungs are
clear aside from mild left basilar scarring which appears stable.
There is probable mild underlying emphysema. Changes of right
humeral head avascular necrosis are noted.

There is stable dilated bowel in the left lower quadrant. Right
lower quadrant ostomy is noted. There is no free intraperitoneal
air. Embolization coils are present within the upper abdomen.
IMPRESSION: Similar appearance of the chest and abdomen with dilated small bowel
in the pelvis, possibly related to subtotal colectomy and ileostomy.

## 2015-11-10 IMAGING — CT CT ANGIO CHEST
2 of 8 series · 19 of 46 positions shown · IV contrast (Omni 300)
Comparison: 04/19/2013

CLINICAL DATA: Shortness of breath. Wheezing. Chest tightness with
increased swelling and extremities

EXAM:
CT ANGIOGRAPHY CHEST WITH CONTRAST
TECHNIQUE: Multidetector CT imaging of the chest was performed using the
standard protocol during bolus administration of intravenous
contrast. Multiplanar CT image reconstructions and MIPs were
obtained to evaluate the vascular anatomy.
CONTRAST:  46mL OMNIPAQUE IOHEXOL 350 MG/ML SOLN

[Series 5: thins · axial · 0.55mm/px · z∈[-286,-15]mm · 16 of 299 slices shown]
[im 14/299  lung]
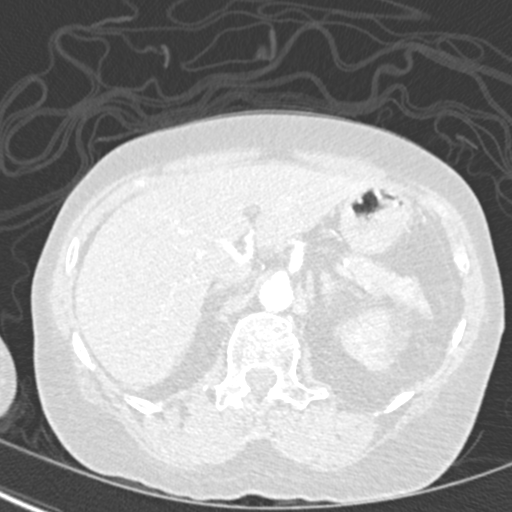
[im 28/299  soft-tissue]
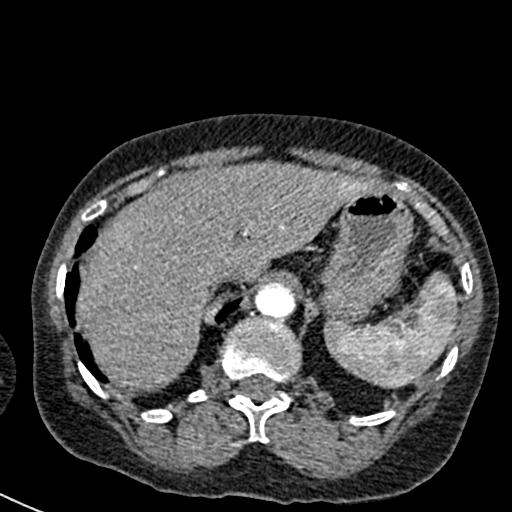
[im 55/299  lung]
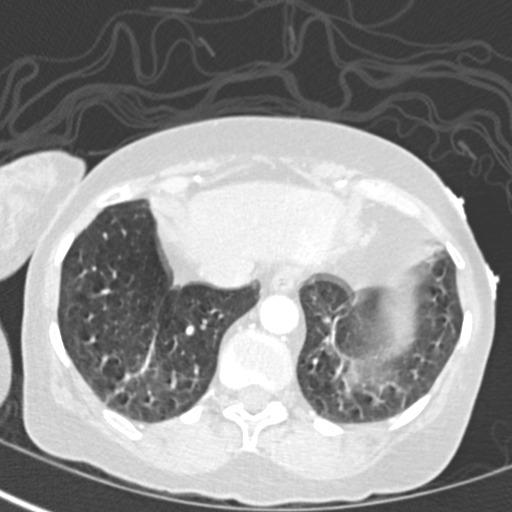
[im 68/299  soft-tissue]
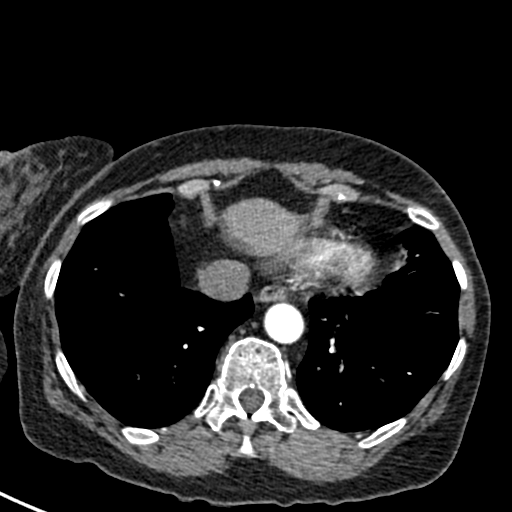
[im 82/299  lung]
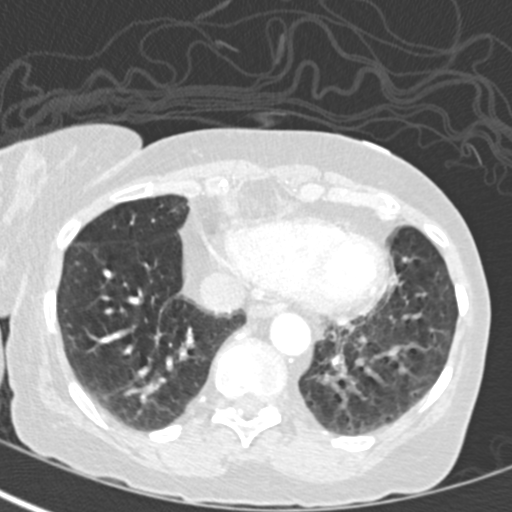
[im 109/299  soft-tissue]
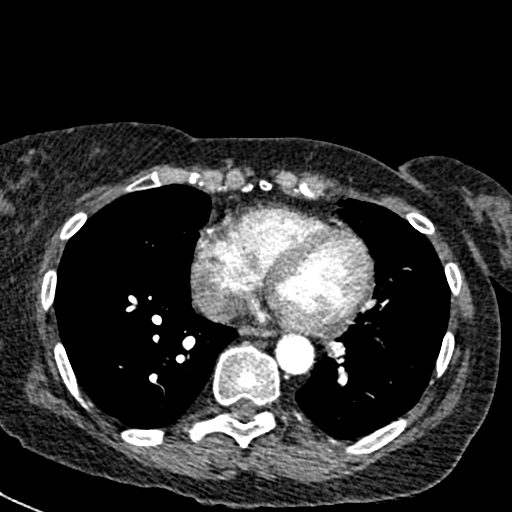
[im 122/299  lung]
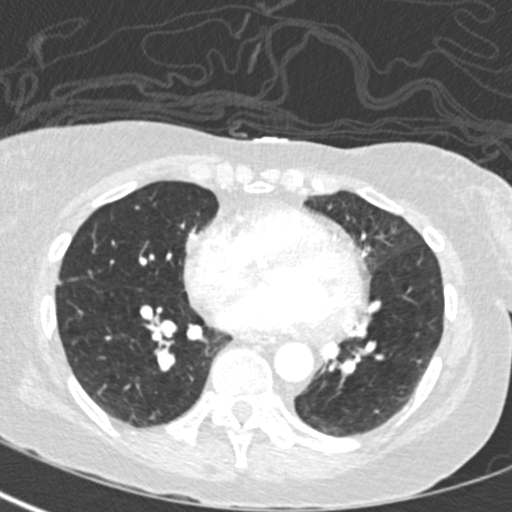
[im 136/299  soft-tissue]
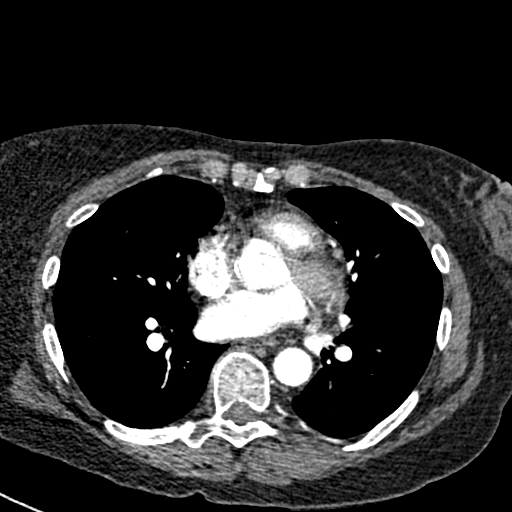
[im 163/299  lung]
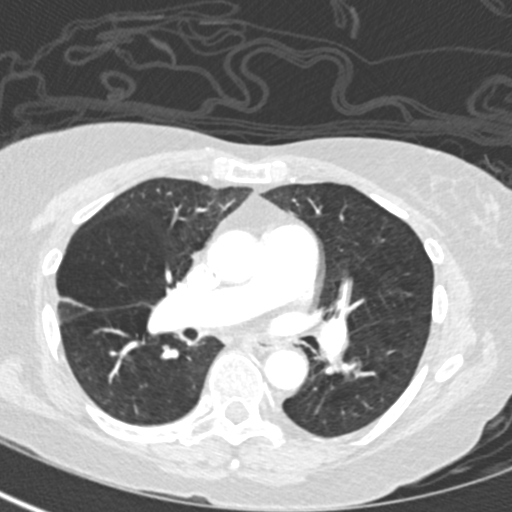
[im 177/299  soft-tissue]
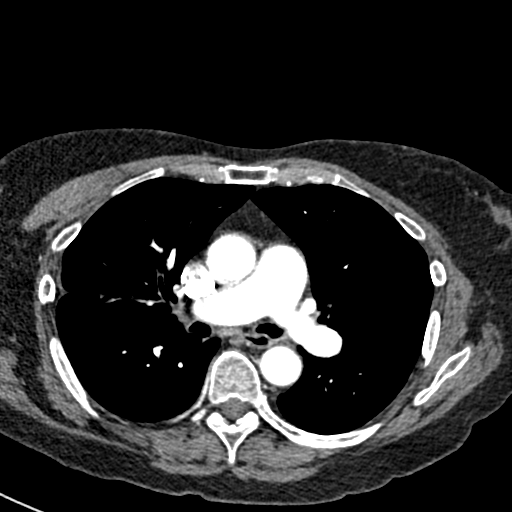
[im 190/299  lung]
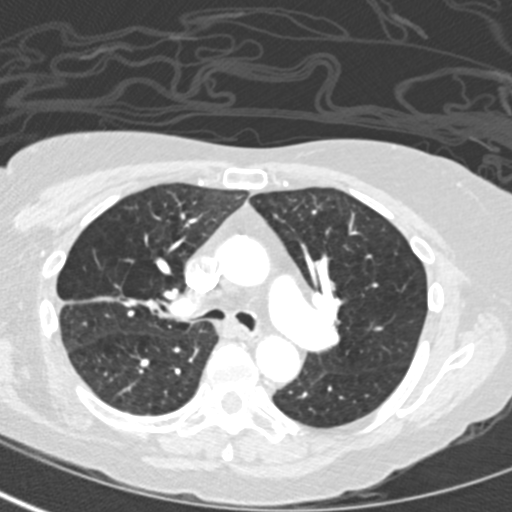
[im 217/299  soft-tissue]
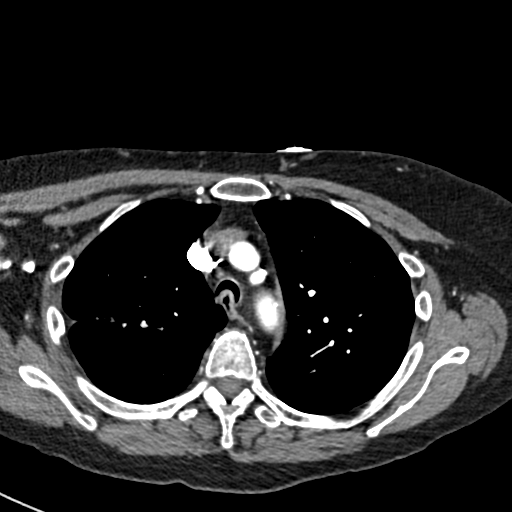
[im 231/299  lung]
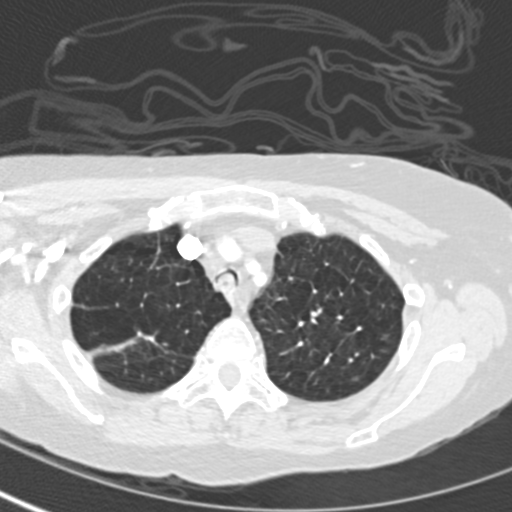
[im 244/299  soft-tissue]
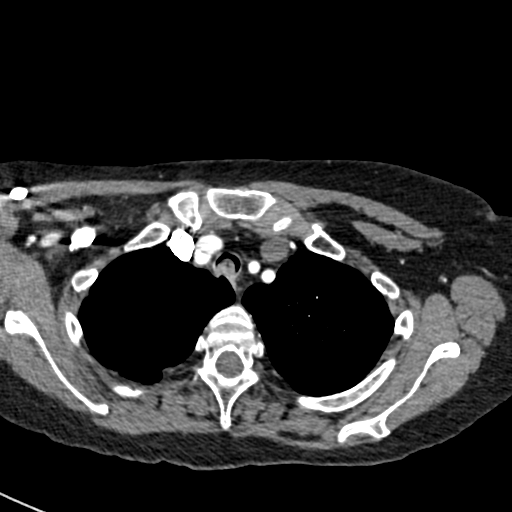
[im 271/299  lung]
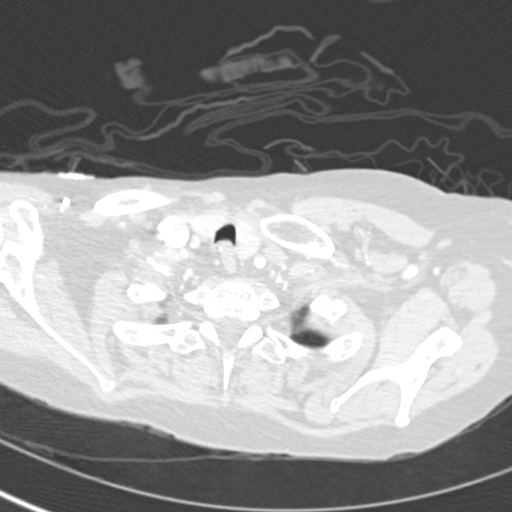
[im 285/299  soft-tissue]
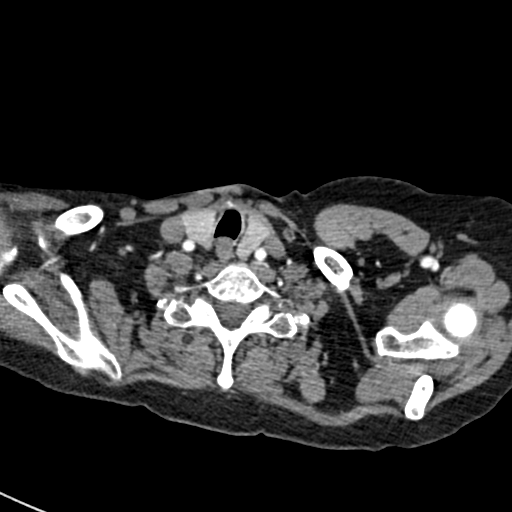

[Series 7: coronal mpr · coronal · 0.59mm/px · 3 of 93 slices shown]
[im 24/93  soft-tissue]
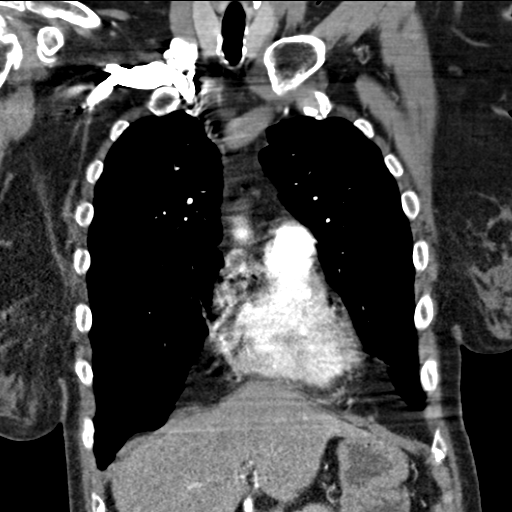
[im 47/93  soft-tissue]
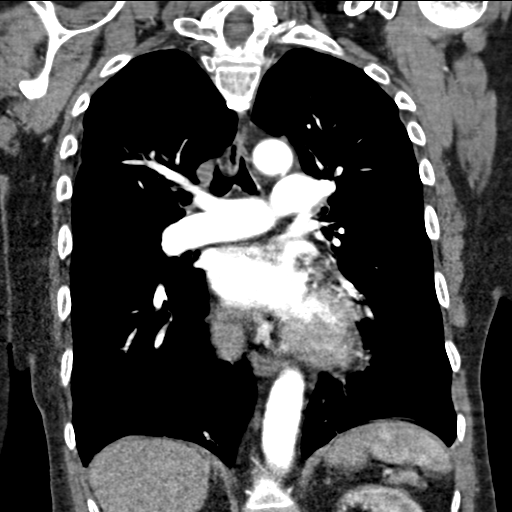
[im 70/93  soft-tissue]
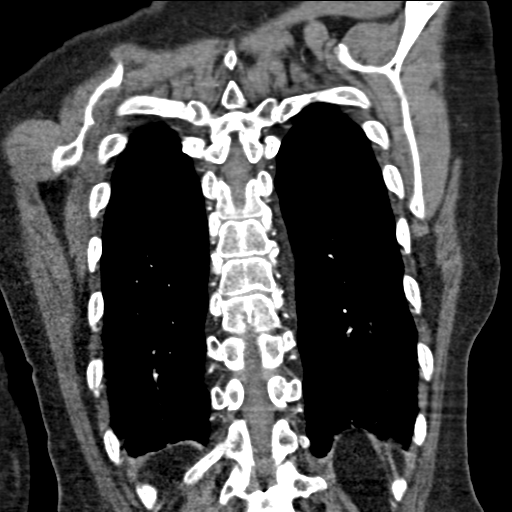

[19 of 46 positions shown; findings below may reference images not displayed]

FINDINGS: Mediastinum: The heart size appears normal. There is no pericardial
effusion identified. Mild calcified atherosclerotic disease involves
the thoracic aorta. Diffuse diffuse AP narrowing of the
intra-thoracic portion of the trachea compatible with chronic
obstructive pulmonary disease. The esophagus appears normal. No
abnormal filling defects noted to suggest acute pulmonary embolus.

Lungs/Pleura: No pleural effusion. There are moderate changes of
centrilobular emphysema. Scar versus atelectasis is noted in the
right upper lobe. No airspace consolidation. No suspicious pulmonary
nodule or mass identified.

Upper Abdomen: No acute findings identified within the limited
portions of the upper abdomen.

Musculoskeletal: Review of the visualized osseous structures is
unremarkable.

Review of the MIP images confirms the above findings.
IMPRESSION: 1. No evidence for acute pulmonary embolus.
2. COPD/emphysema.

## 2015-11-10 IMAGING — CR DG CHEST 1V PORT
1 series · 1 of 1 positions shown · non-contrast
Comparison: August 10, 2013.

CLINICAL DATA: Chest pain, shortness of breath.

EXAM:
PORTABLE CHEST - 1 VIEW

[AP]
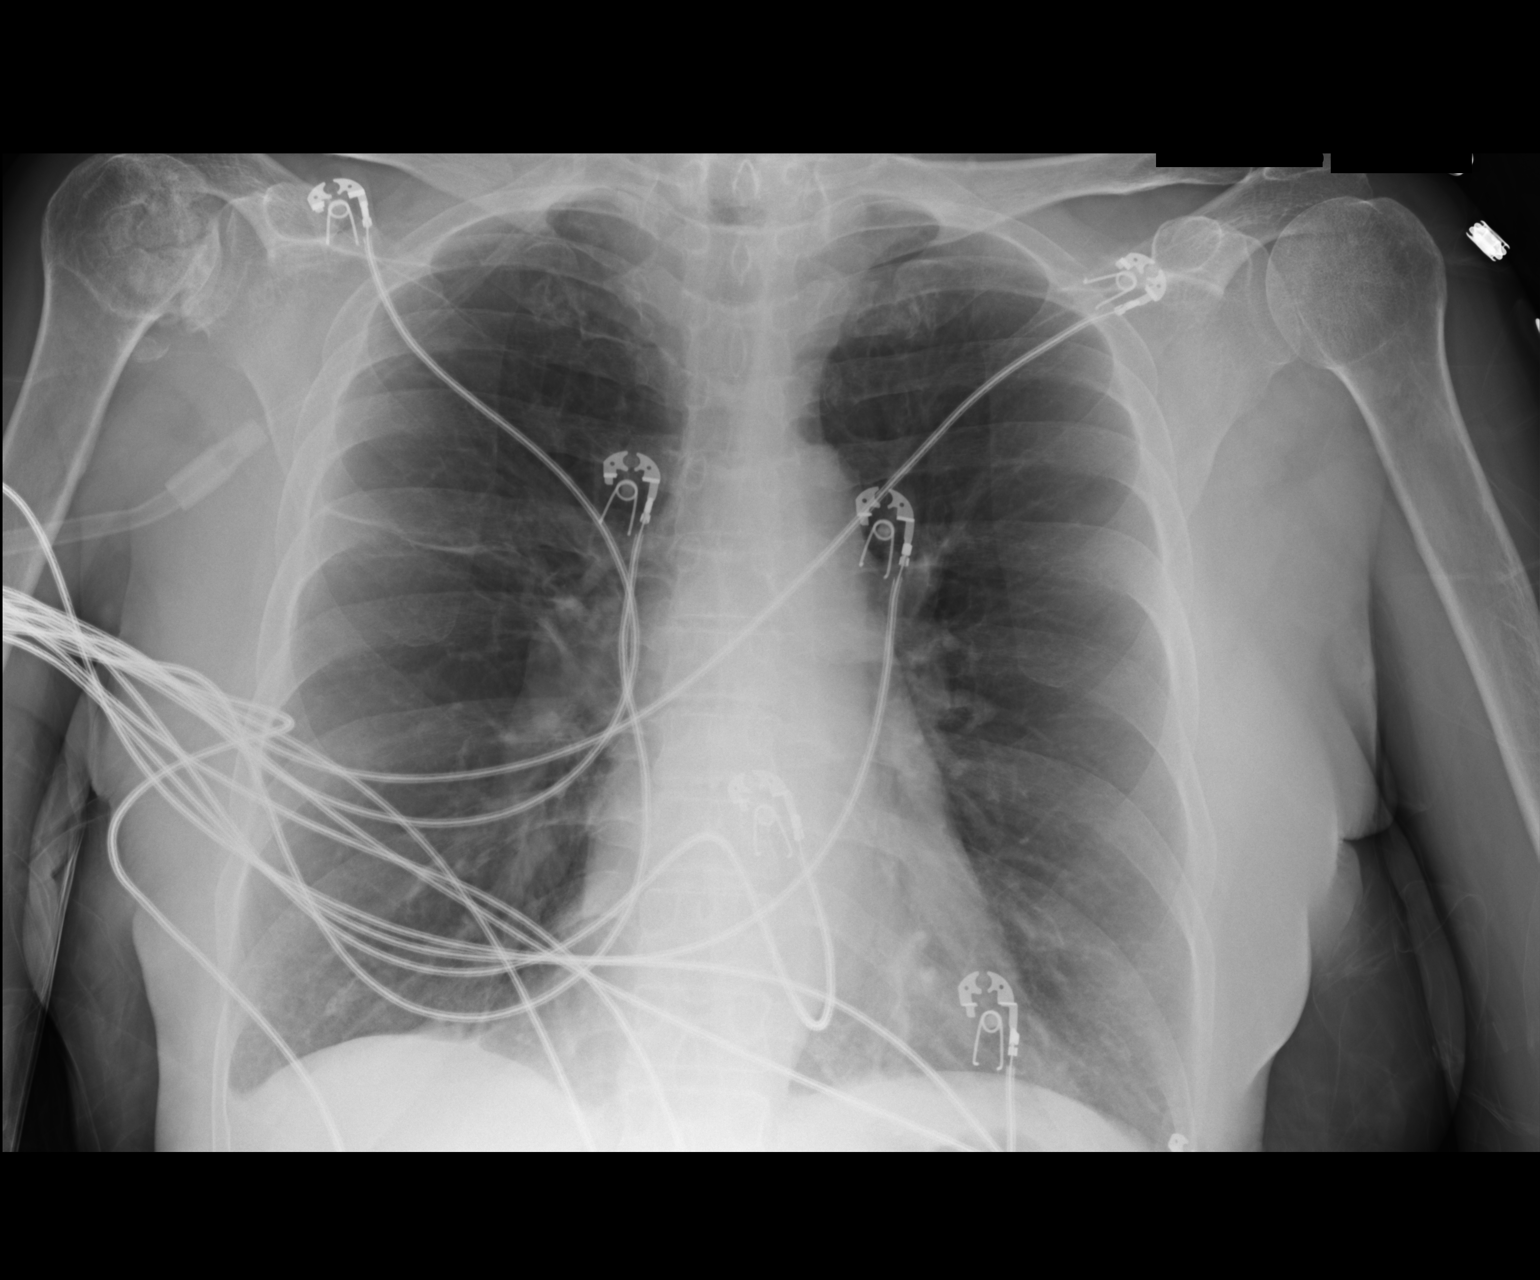

[1 of 1 positions shown; findings below may reference images not displayed]

FINDINGS: The heart size and mediastinal contours are within normal limits.
Both lungs are clear. No pneumothorax or pleural effusion is noted.
Avascular necrosis of proximal right humeral head is again noted.
IMPRESSION: No acute cardiopulmonary abnormality seen.

## 2015-12-11 IMAGING — CR DG ABD PORTABLE 2V
3 series · 3 of 3 positions shown · non-contrast
Comparison: August 10, 2013.

CLINICAL DATA: Acute generalized abdominal pain.

EXAM:
PORTABLE ABDOMEN - 2 VIEW

[AP]
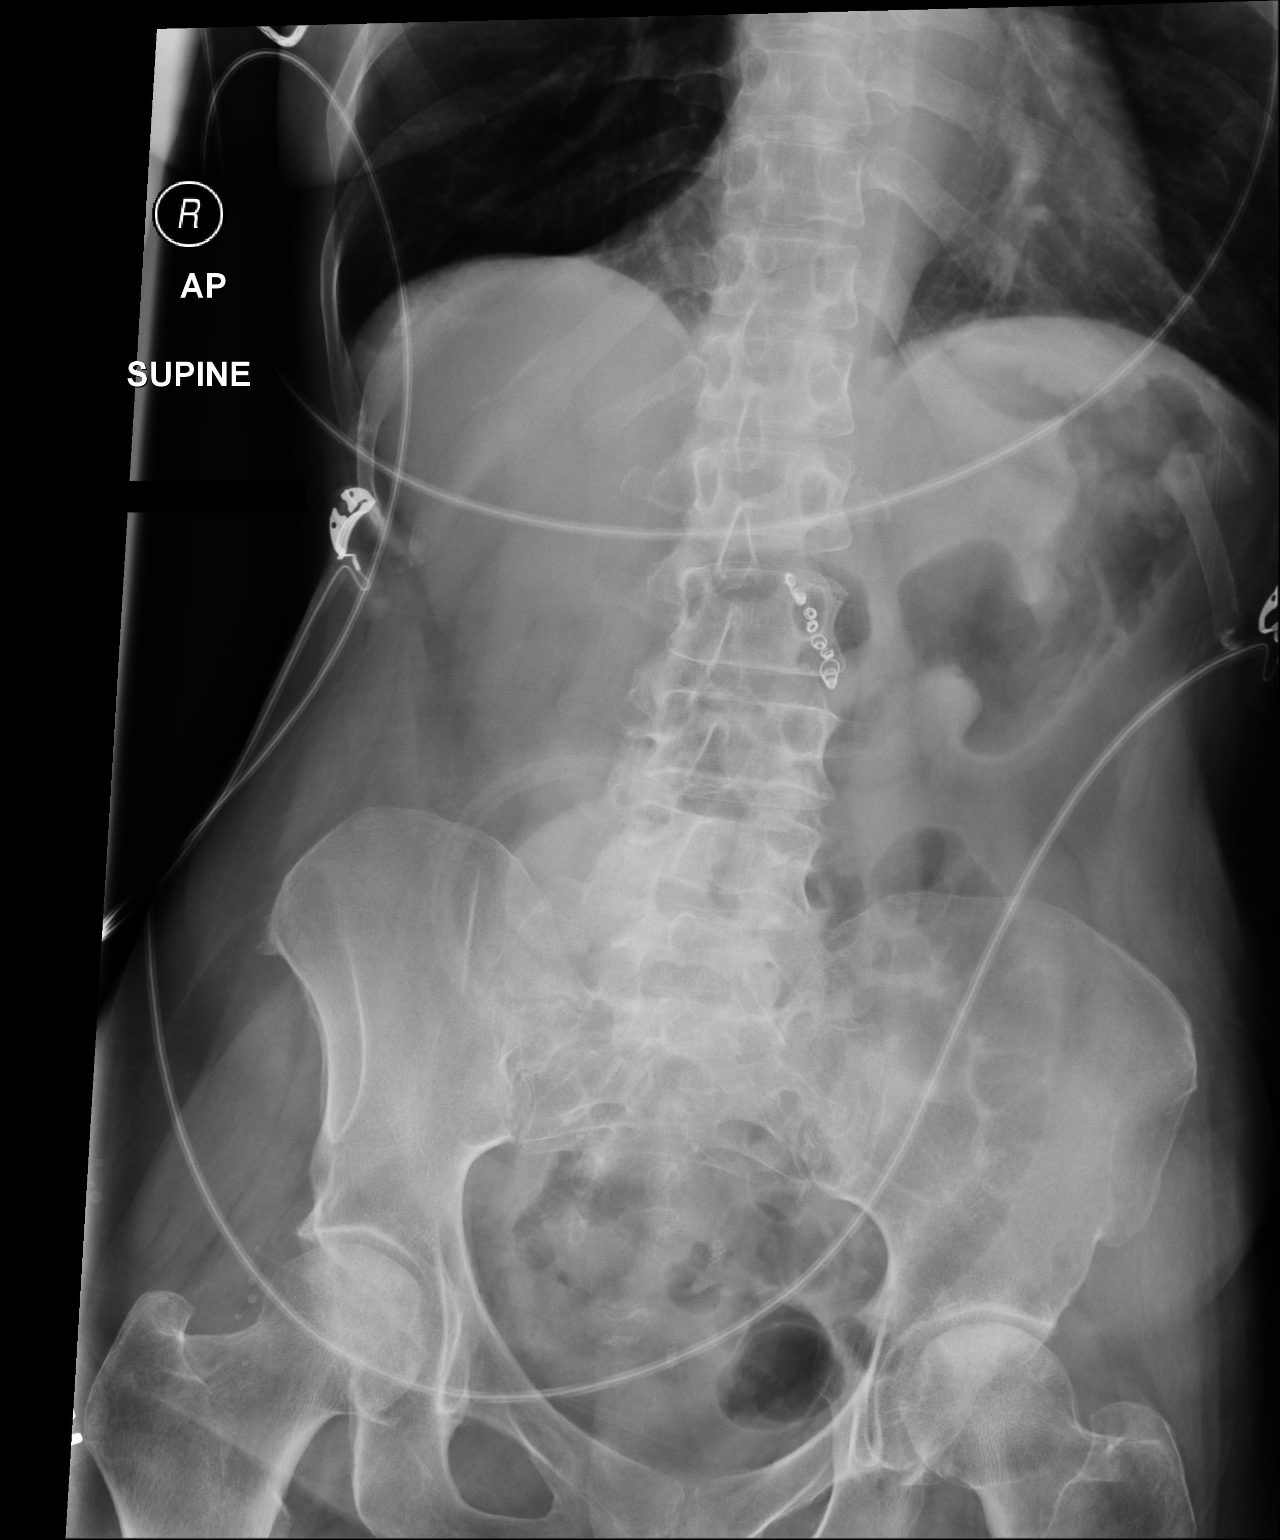

[ap lld (1 of 2)]
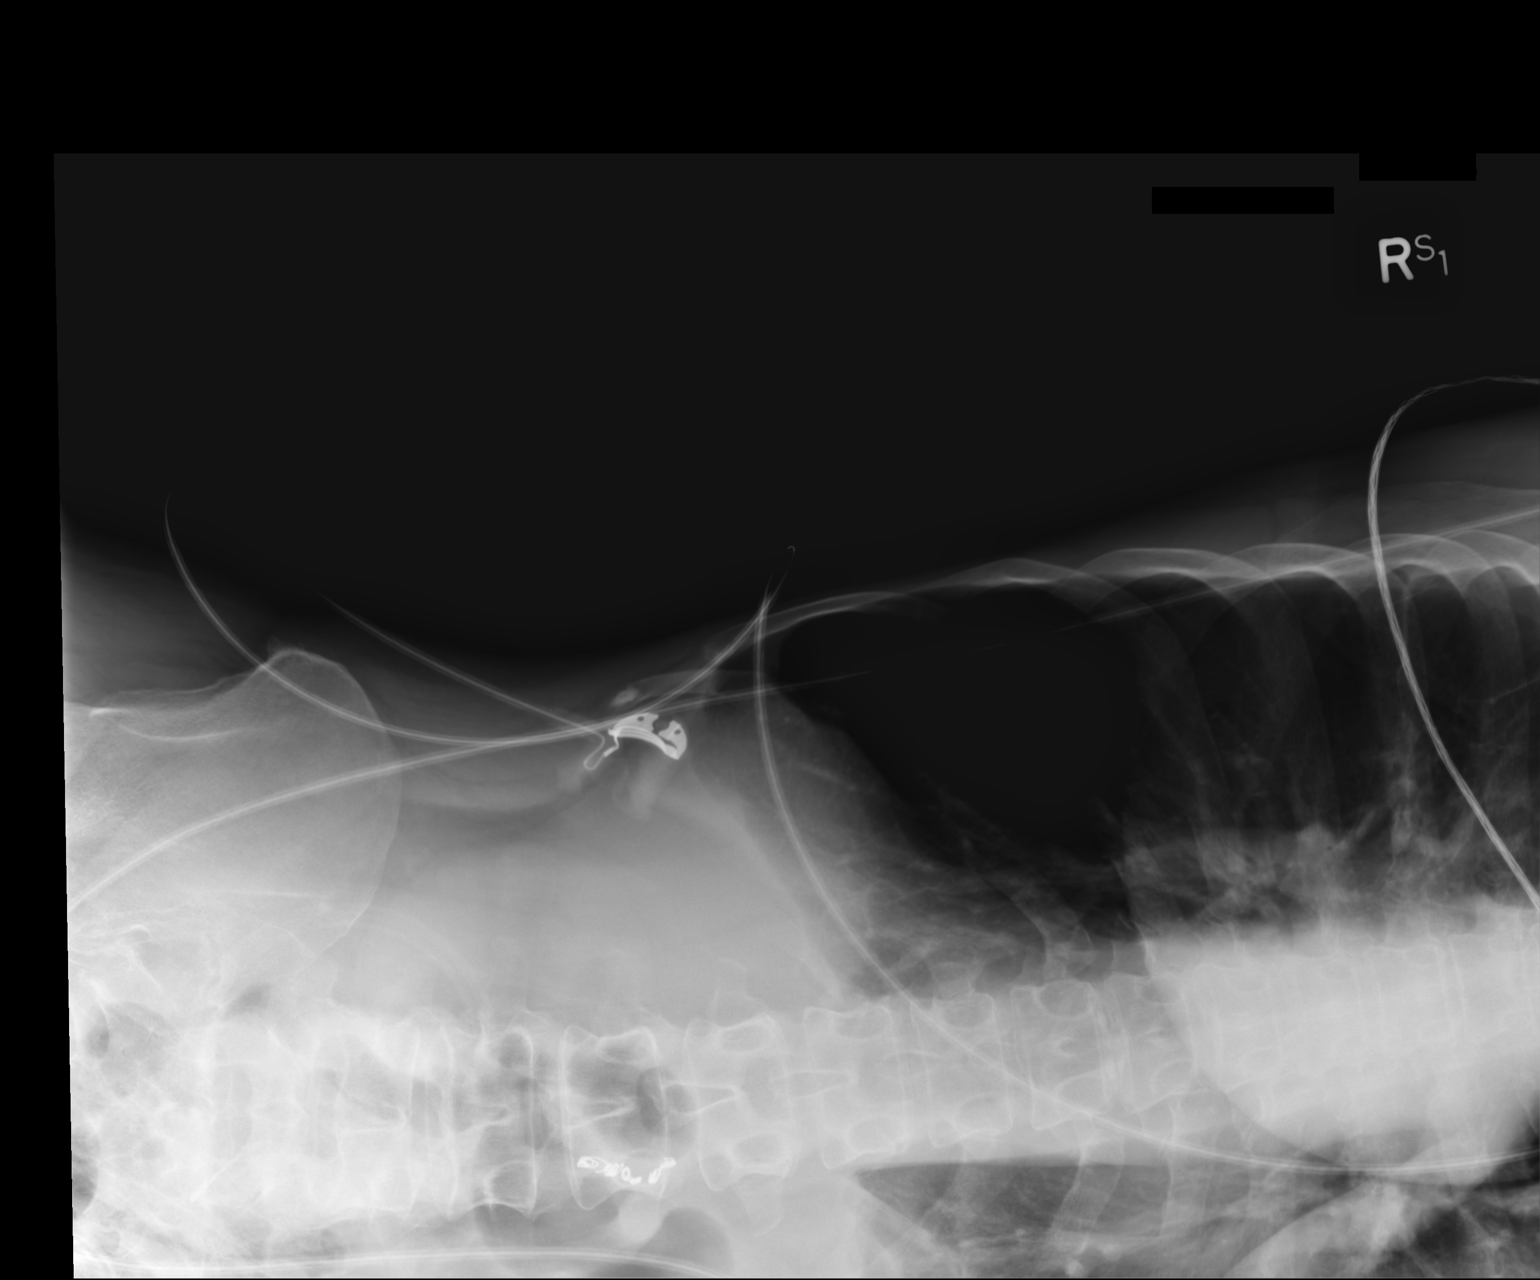

[ap lld (2 of 2)]
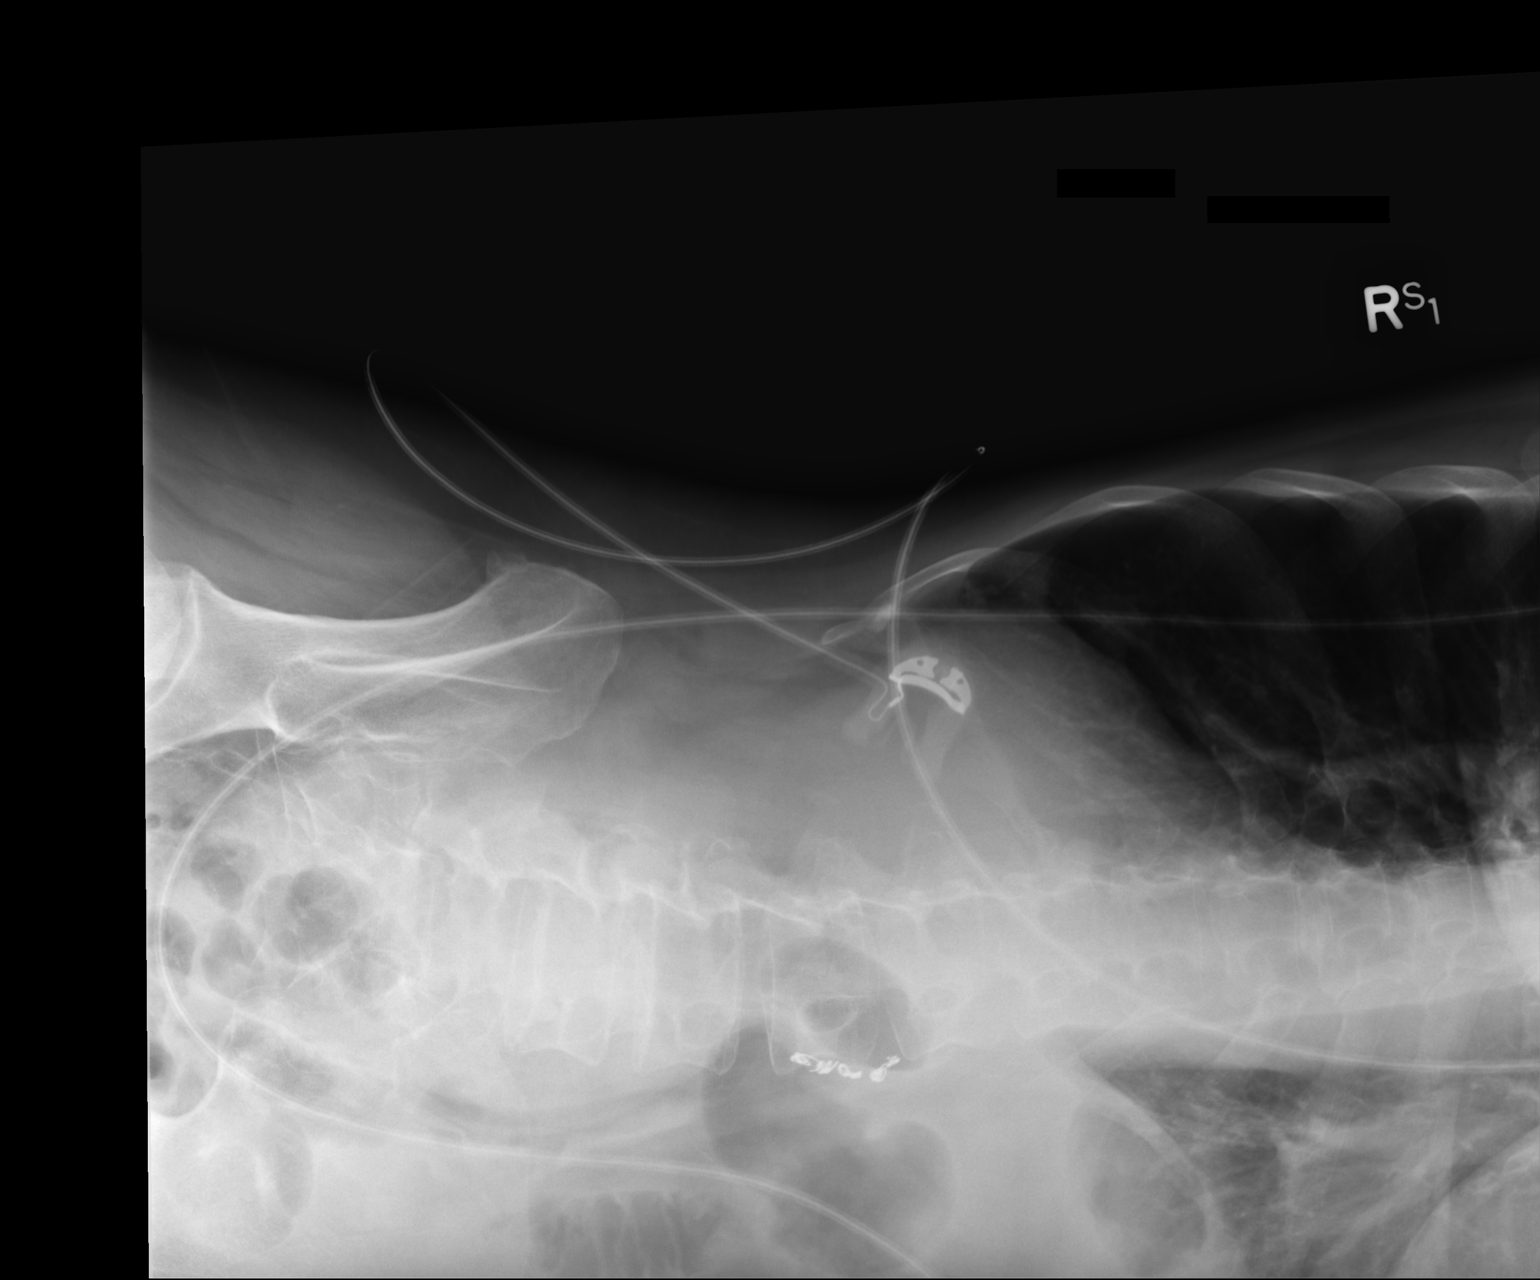

[3 of 3 positions shown; findings below may reference images not displayed]

FINDINGS: Embolization coils are again noted in the central abdomen. Ostomy is
noted in the right lower quadrant. There is no evidence of
pneumoperitoneum. No abnormal bowel gas pattern is noted. No
abnormal calcifications are noted.
IMPRESSION: Postsurgical changes as described above. No evidence of bowel
obstruction or ileus.

## 2015-12-12 IMAGING — CR DG CHEST 1V PORT
1 series · 1 of 1 positions shown · non-contrast
Comparison: 04/22/2014

CLINICAL DATA: Respiratory failure

EXAM:
PORTABLE CHEST - 1 VIEW

[AP]
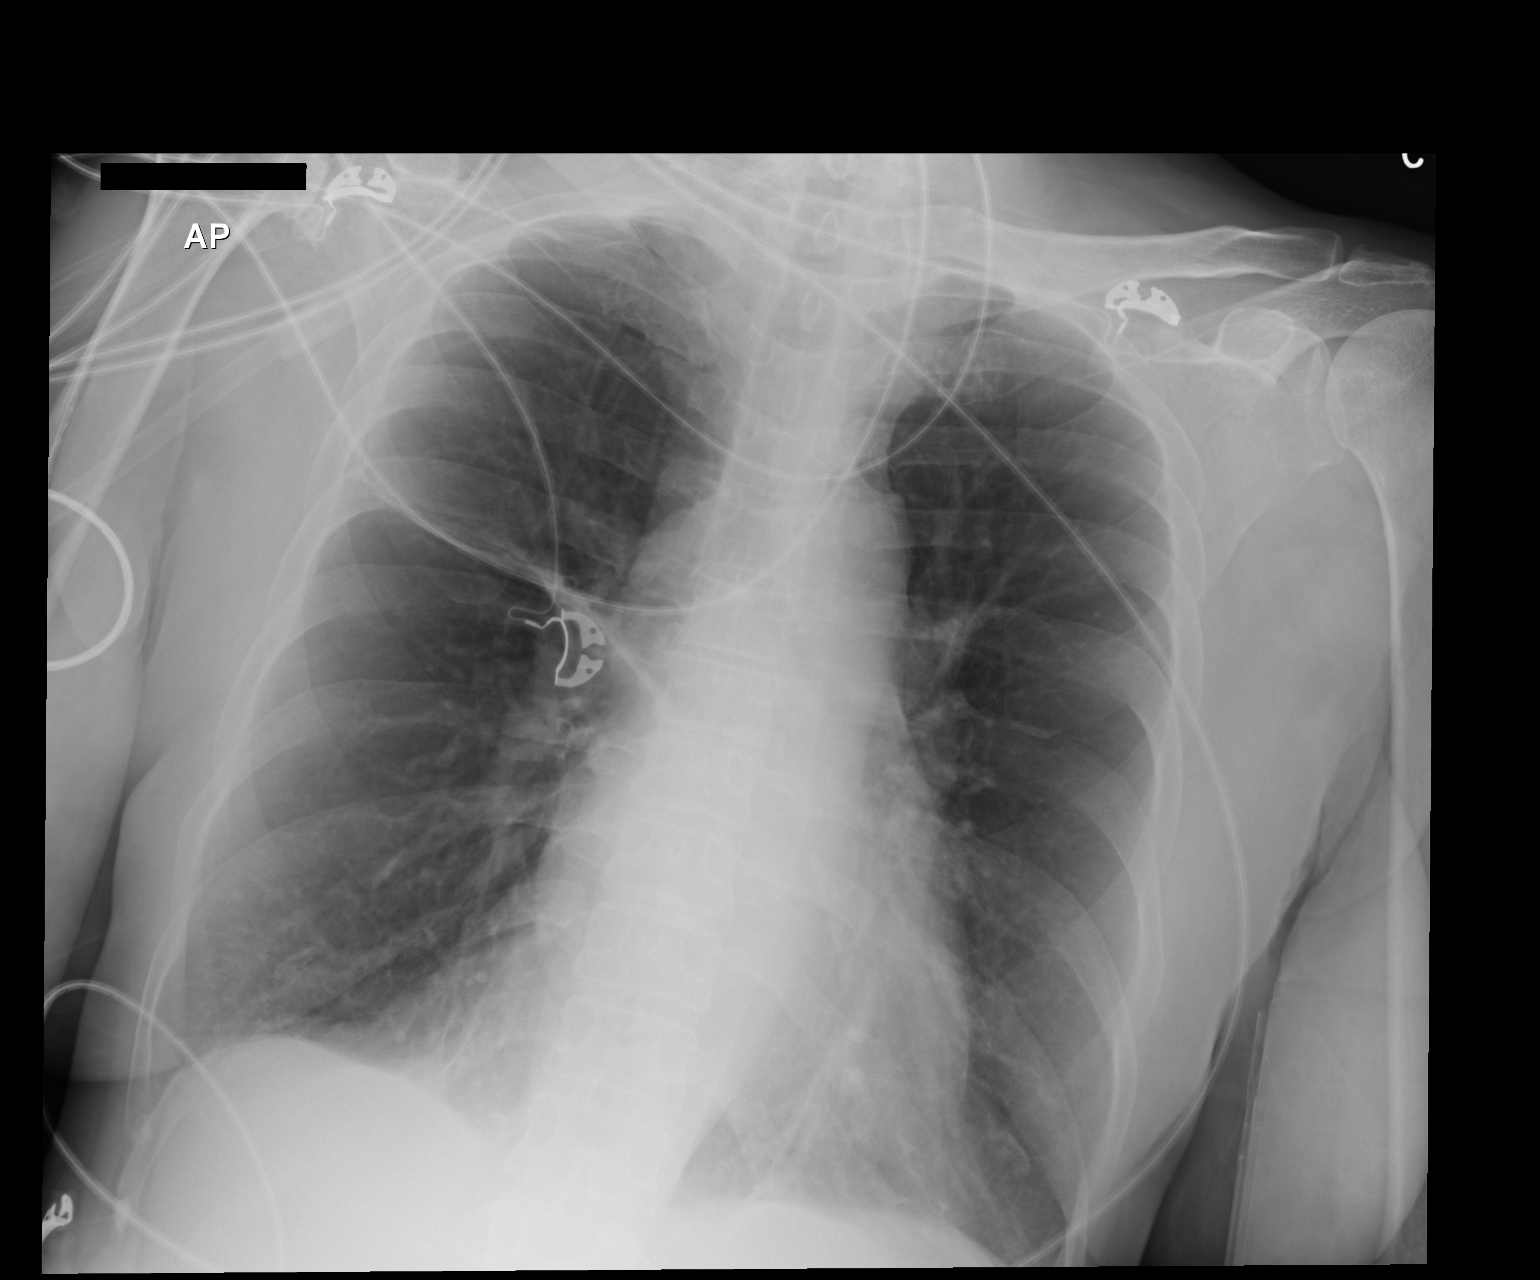

[1 of 1 positions shown; findings below may reference images not displayed]

FINDINGS: Lungs are clear.  No pleural effusion or pneumothorax.

The heart is normal in size.
IMPRESSION: No evidence of acute cardiopulmonary disease.

## 2015-12-15 IMAGING — CR DG CHEST 1V PORT
2 series · 2 of 2 positions shown · non-contrast
Comparison: 04/23/2014.

CLINICAL DATA: Shortness of breath.  COPD.

EXAM:
PORTABLE CHEST - 1 VIEW

[AP (1 of 2)]
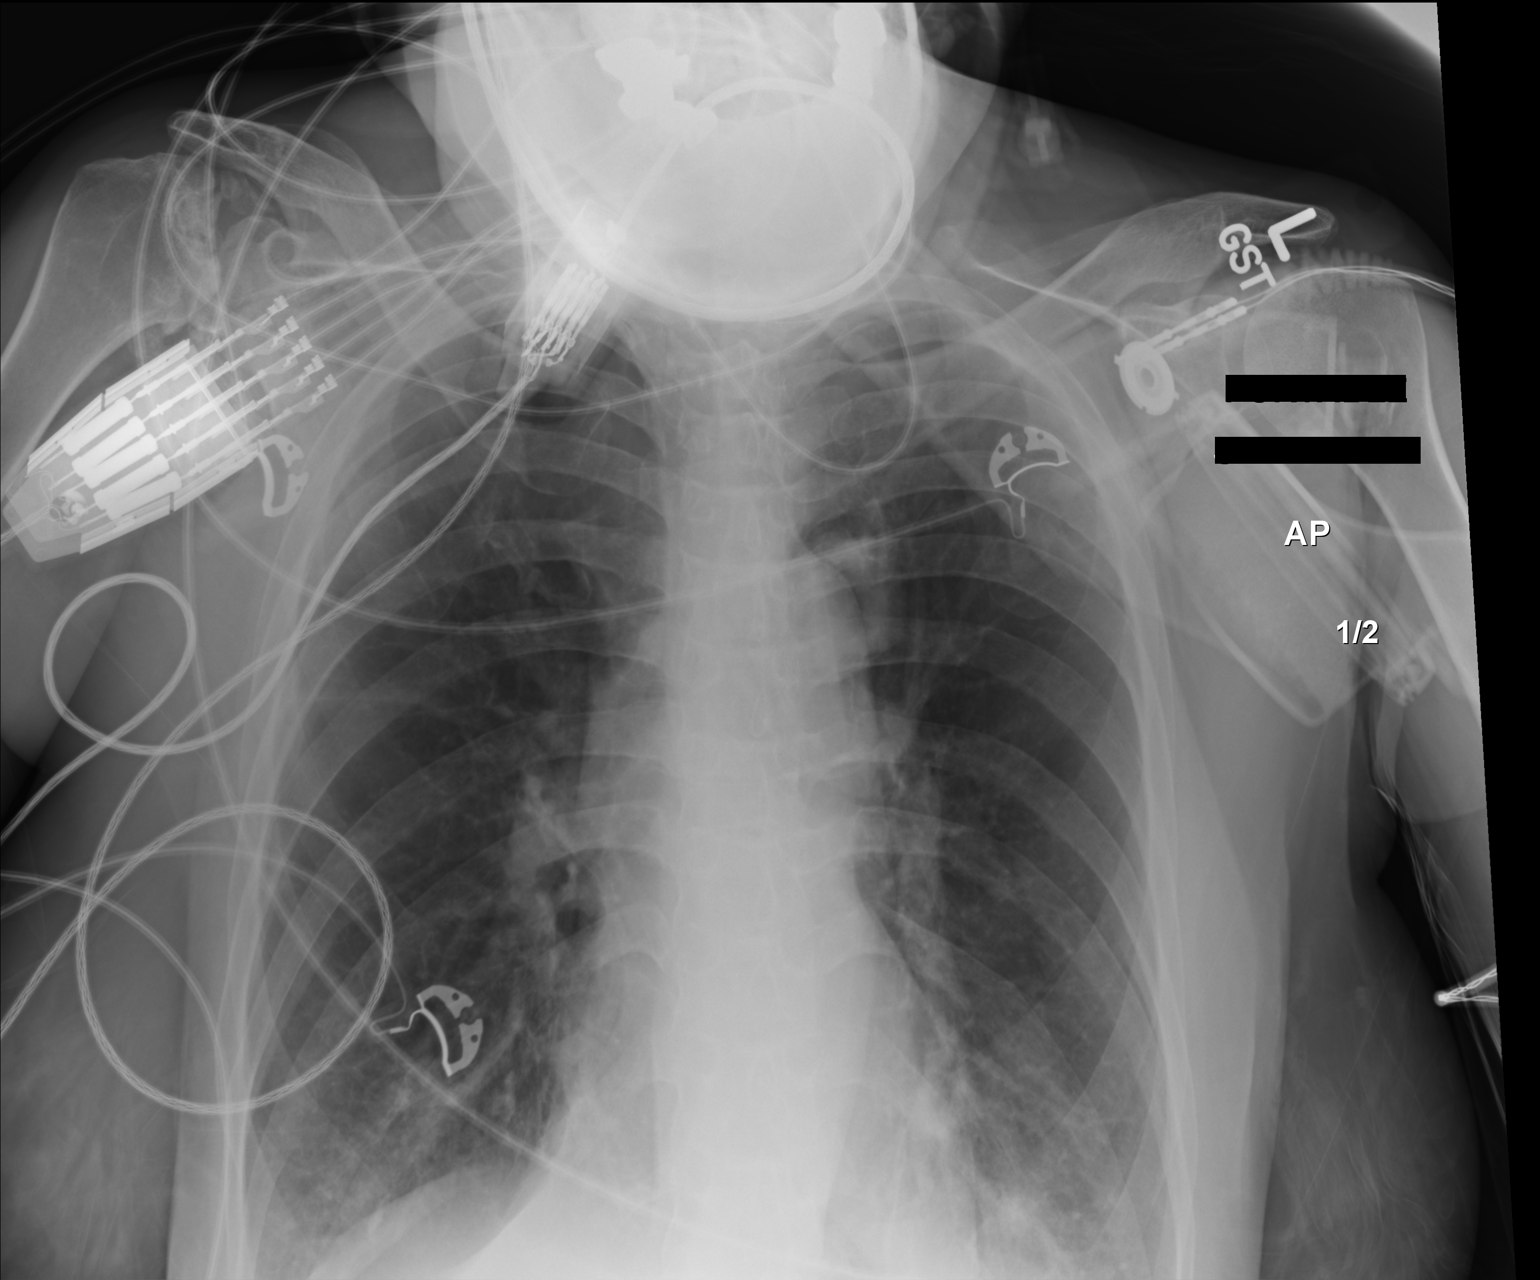

[AP (2 of 2)]
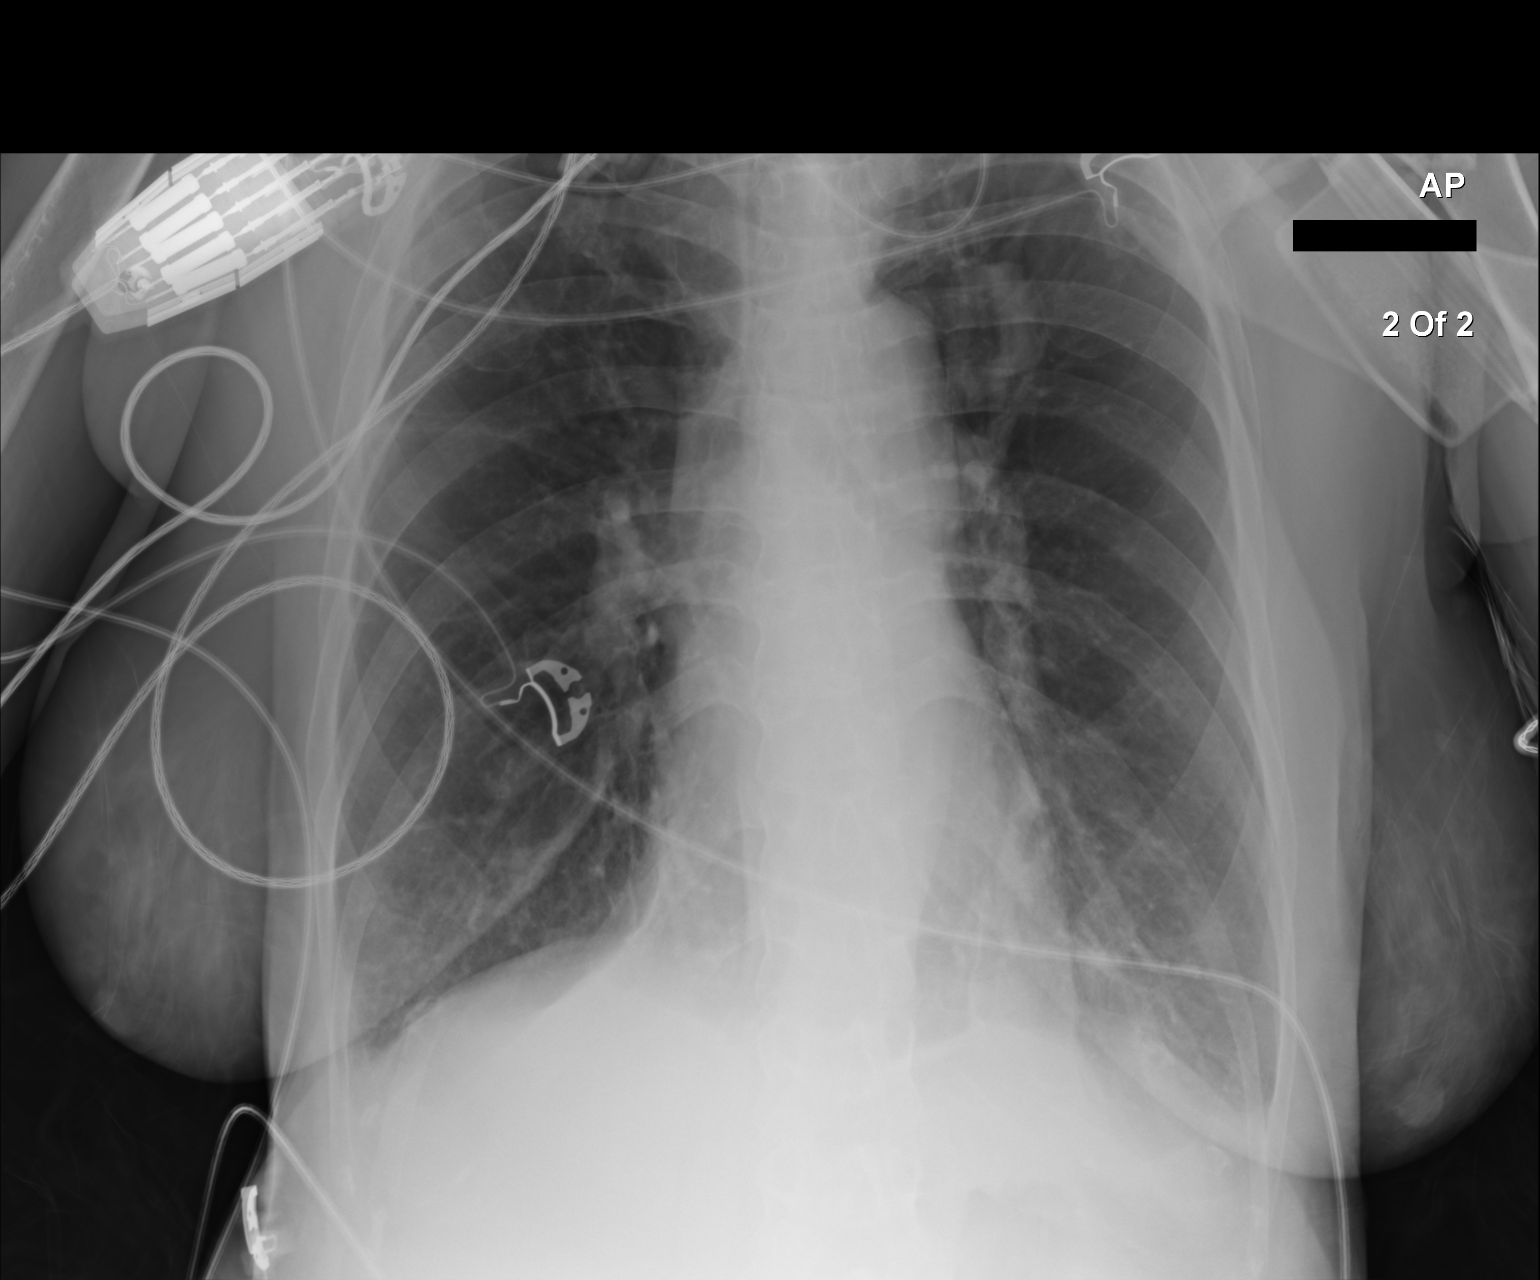

[2 of 2 positions shown; findings below may reference images not displayed]

FINDINGS: Mediastinum and hilar structures are normal. Heart size normal.
Bibasilar atelectasis and/or pulmonary infiltrates. Left upper lobe
subsegmental atelectasis. These findings are new. Tiny pleural
effusions cannot be excluded. No pneumothorax. No acute osseous
abnormality.
IMPRESSION: 1. Development of bibasilar atelectasis and/or infiltrates.
2. Subsequent atelectasis left upper lobe.
3. Tiny bilateral pleural effusions.
